# Patient Record
Sex: Female | Born: 1935 | Race: White | Hispanic: No | Marital: Married | Smoking: Former smoker
Health system: Southern US, Community
[De-identification: ages and names within clinical notes are randomized; demographics above are authoritative.]

## PROBLEM LIST (undated history)

## (undated) DIAGNOSIS — K219 Gastro-esophageal reflux disease without esophagitis: Secondary | ICD-10-CM

## (undated) DIAGNOSIS — Z8489 Family history of other specified conditions: Secondary | ICD-10-CM

## (undated) DIAGNOSIS — J189 Pneumonia, unspecified organism: Secondary | ICD-10-CM

## (undated) DIAGNOSIS — C8315 Mantle cell lymphoma, lymph nodes of inguinal region and lower limb: Secondary | ICD-10-CM

## (undated) DIAGNOSIS — IMO0001 Reserved for inherently not codable concepts without codable children: Secondary | ICD-10-CM

## (undated) DIAGNOSIS — K279 Peptic ulcer, site unspecified, unspecified as acute or chronic, without hemorrhage or perforation: Secondary | ICD-10-CM

## (undated) DIAGNOSIS — M1990 Unspecified osteoarthritis, unspecified site: Secondary | ICD-10-CM

## (undated) DIAGNOSIS — E785 Hyperlipidemia, unspecified: Secondary | ICD-10-CM

## (undated) DIAGNOSIS — F329 Major depressive disorder, single episode, unspecified: Secondary | ICD-10-CM

## (undated) DIAGNOSIS — J449 Chronic obstructive pulmonary disease, unspecified: Secondary | ICD-10-CM

## (undated) DIAGNOSIS — K5733 Diverticulitis of large intestine without perforation or abscess with bleeding: Secondary | ICD-10-CM

## (undated) DIAGNOSIS — R569 Unspecified convulsions: Secondary | ICD-10-CM

## (undated) DIAGNOSIS — Z8673 Personal history of transient ischemic attack (TIA), and cerebral infarction without residual deficits: Secondary | ICD-10-CM

## (undated) DIAGNOSIS — I2510 Atherosclerotic heart disease of native coronary artery without angina pectoris: Secondary | ICD-10-CM

## (undated) DIAGNOSIS — I10 Essential (primary) hypertension: Secondary | ICD-10-CM

## (undated) DIAGNOSIS — I639 Cerebral infarction, unspecified: Secondary | ICD-10-CM

## (undated) DIAGNOSIS — R0602 Shortness of breath: Secondary | ICD-10-CM

## (undated) DIAGNOSIS — I739 Peripheral vascular disease, unspecified: Secondary | ICD-10-CM

## (undated) DIAGNOSIS — F32A Depression, unspecified: Secondary | ICD-10-CM

## (undated) DIAGNOSIS — C329 Malignant neoplasm of larynx, unspecified: Secondary | ICD-10-CM

## (undated) DIAGNOSIS — I8310 Varicose veins of unspecified lower extremity with inflammation: Secondary | ICD-10-CM

## (undated) DIAGNOSIS — Z5189 Encounter for other specified aftercare: Secondary | ICD-10-CM

## (undated) DIAGNOSIS — K432 Incisional hernia without obstruction or gangrene: Secondary | ICD-10-CM

## (undated) DIAGNOSIS — R011 Cardiac murmur, unspecified: Secondary | ICD-10-CM

## (undated) DIAGNOSIS — I82409 Acute embolism and thrombosis of unspecified deep veins of unspecified lower extremity: Secondary | ICD-10-CM

## (undated) DIAGNOSIS — L858 Other specified epidermal thickening: Secondary | ICD-10-CM

## (undated) DIAGNOSIS — F419 Anxiety disorder, unspecified: Secondary | ICD-10-CM

## (undated) DIAGNOSIS — Z8719 Personal history of other diseases of the digestive system: Secondary | ICD-10-CM

## (undated) HISTORY — DX: Cerebral infarction, unspecified: I63.9

## (undated) HISTORY — DX: Unspecified osteoarthritis, unspecified site: M19.90

## (undated) HISTORY — DX: Personal history of transient ischemic attack (TIA), and cerebral infarction without residual deficits: Z86.73

## (undated) HISTORY — DX: Depression, unspecified: F32.A

## (undated) HISTORY — PX: TOTAL KNEE ARTHROPLASTY: SHX125

## (undated) HISTORY — DX: Hyperlipidemia, unspecified: E78.5

## (undated) HISTORY — DX: Incisional hernia without obstruction or gangrene: K43.2

## (undated) HISTORY — DX: Encounter for other specified aftercare: Z51.89

## (undated) HISTORY — DX: Gastro-esophageal reflux disease without esophagitis: K21.9

## (undated) HISTORY — DX: Peripheral vascular disease, unspecified: I73.9

## (undated) HISTORY — DX: Major depressive disorder, single episode, unspecified: F32.9

## (undated) HISTORY — PX: CARPAL TUNNEL RELEASE: SHX101

## (undated) HISTORY — DX: Other specified epidermal thickening: L85.8

## (undated) HISTORY — DX: Reserved for inherently not codable concepts without codable children: IMO0001

## (undated) HISTORY — DX: Varicose veins of unspecified lower extremity with inflammation: I83.10

## (undated) HISTORY — DX: Peptic ulcer, site unspecified, unspecified as acute or chronic, without hemorrhage or perforation: K27.9

## (undated) HISTORY — DX: Malignant neoplasm of larynx, unspecified: C32.9

## (undated) HISTORY — DX: Diverticulitis of large intestine without perforation or abscess with bleeding: K57.33

## (undated) HISTORY — DX: Chronic obstructive pulmonary disease, unspecified: J44.9

## (undated) HISTORY — DX: Anxiety disorder, unspecified: F41.9

## (undated) HISTORY — DX: Pneumonia, unspecified organism: J18.9

---
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
## 1970-08-03 HISTORY — PX: TUBAL LIGATION: SHX77

## 1984-08-03 HISTORY — PX: RADICAL NECK DISSECTION: SHX2284

## 2002-08-03 HISTORY — PX: CAROTID ENDARTERECTOMY: SUR193

## 2003-08-04 HISTORY — PX: CATARACT EXTRACTION: SUR2

## 2005-03-24 ENCOUNTER — Ambulatory Visit: Payer: Self-pay | Admitting: Internal Medicine

## 2005-03-25 ENCOUNTER — Ambulatory Visit: Payer: Self-pay | Admitting: Family Medicine

## 2005-04-28 ENCOUNTER — Ambulatory Visit: Payer: Self-pay | Admitting: Internal Medicine

## 2005-05-19 ENCOUNTER — Encounter: Admission: RE | Admit: 2005-05-19 | Discharge: 2005-05-19 | Payer: Self-pay | Admitting: Internal Medicine

## 2005-06-11 ENCOUNTER — Ambulatory Visit: Payer: Self-pay | Admitting: Internal Medicine

## 2005-06-15 ENCOUNTER — Ambulatory Visit: Payer: Self-pay | Admitting: Internal Medicine

## 2005-06-23 ENCOUNTER — Ambulatory Visit: Payer: Self-pay | Admitting: Internal Medicine

## 2005-06-30 ENCOUNTER — Ambulatory Visit: Payer: Self-pay | Admitting: Internal Medicine

## 2005-07-14 ENCOUNTER — Encounter: Admission: RE | Admit: 2005-07-14 | Discharge: 2005-07-14 | Payer: Self-pay | Admitting: Internal Medicine

## 2005-09-11 ENCOUNTER — Ambulatory Visit: Payer: Self-pay | Admitting: Internal Medicine

## 2005-12-03 ENCOUNTER — Ambulatory Visit: Payer: Self-pay | Admitting: Internal Medicine

## 2005-12-04 ENCOUNTER — Ambulatory Visit: Payer: Self-pay | Admitting: Cardiology

## 2005-12-14 ENCOUNTER — Ambulatory Visit: Payer: Self-pay | Admitting: Internal Medicine

## 2005-12-23 ENCOUNTER — Ambulatory Visit: Payer: Self-pay | Admitting: Internal Medicine

## 2006-01-05 ENCOUNTER — Ambulatory Visit: Payer: Self-pay | Admitting: Internal Medicine

## 2006-01-06 ENCOUNTER — Encounter: Admission: RE | Admit: 2006-01-06 | Discharge: 2006-01-06 | Payer: Self-pay | Admitting: Internal Medicine

## 2006-02-22 ENCOUNTER — Ambulatory Visit: Payer: Self-pay | Admitting: Internal Medicine

## 2006-05-06 ENCOUNTER — Ambulatory Visit: Payer: Self-pay | Admitting: Internal Medicine

## 2006-08-19 ENCOUNTER — Ambulatory Visit: Payer: Self-pay | Admitting: Internal Medicine

## 2006-11-26 ENCOUNTER — Ambulatory Visit: Payer: Self-pay | Admitting: Internal Medicine

## 2007-03-18 DIAGNOSIS — Z8679 Personal history of other diseases of the circulatory system: Secondary | ICD-10-CM | POA: Insufficient documentation

## 2007-03-18 DIAGNOSIS — I10 Essential (primary) hypertension: Secondary | ICD-10-CM | POA: Insufficient documentation

## 2007-03-18 DIAGNOSIS — E785 Hyperlipidemia, unspecified: Secondary | ICD-10-CM | POA: Insufficient documentation

## 2007-05-03 ENCOUNTER — Ambulatory Visit: Payer: Self-pay | Admitting: Internal Medicine

## 2007-05-03 DIAGNOSIS — M1710 Unilateral primary osteoarthritis, unspecified knee: Secondary | ICD-10-CM | POA: Insufficient documentation

## 2007-05-03 DIAGNOSIS — K219 Gastro-esophageal reflux disease without esophagitis: Secondary | ICD-10-CM

## 2007-05-03 DIAGNOSIS — M1990 Unspecified osteoarthritis, unspecified site: Secondary | ICD-10-CM | POA: Insufficient documentation

## 2007-05-03 HISTORY — DX: Unspecified osteoarthritis, unspecified site: M19.90

## 2007-05-03 HISTORY — DX: Gastro-esophageal reflux disease without esophagitis: K21.9

## 2007-05-03 LAB — CONVERTED CEMR LAB: HDL goal, serum: 40 mg/dL

## 2007-05-25 ENCOUNTER — Ambulatory Visit: Payer: Self-pay | Admitting: Internal Medicine

## 2007-06-15 ENCOUNTER — Inpatient Hospital Stay (HOSPITAL_COMMUNITY): Admission: RE | Admit: 2007-06-15 | Discharge: 2007-06-19 | Payer: Self-pay | Admitting: Orthopedic Surgery

## 2007-09-05 ENCOUNTER — Telehealth: Payer: Self-pay | Admitting: Internal Medicine

## 2007-09-08 ENCOUNTER — Ambulatory Visit: Payer: Self-pay | Admitting: Internal Medicine

## 2007-09-08 DIAGNOSIS — R61 Generalized hyperhidrosis: Secondary | ICD-10-CM | POA: Insufficient documentation

## 2007-09-08 LAB — CONVERTED CEMR LAB
Basophils Relative: 0.6 % (ref 0.0–1.0)
CRP, High Sensitivity: 1 (ref 0.00–5.00)
Eosinophils Relative: 2.1 % (ref 0.0–5.0)
HCT: 40 % (ref 36.0–46.0)
Hemoglobin: 12.8 g/dL (ref 12.0–15.0)
Lymphocytes Relative: 25.9 % (ref 12.0–46.0)
MCV: 83.7 fL (ref 78.0–100.0)
Neutro Abs: 4.3 10*3/uL (ref 1.4–7.7)
Neutrophils Relative %: 62.7 % (ref 43.0–77.0)
Platelets: 273 10*3/uL (ref 150–400)
WBC: 6.8 10*3/uL (ref 4.5–10.5)

## 2007-10-06 ENCOUNTER — Ambulatory Visit: Payer: Self-pay | Admitting: Internal Medicine

## 2007-10-06 DIAGNOSIS — L659 Nonscarring hair loss, unspecified: Secondary | ICD-10-CM | POA: Insufficient documentation

## 2008-01-17 ENCOUNTER — Ambulatory Visit: Payer: Self-pay | Admitting: Internal Medicine

## 2008-03-21 ENCOUNTER — Telehealth: Payer: Self-pay | Admitting: Internal Medicine

## 2008-03-26 ENCOUNTER — Ambulatory Visit: Payer: Self-pay | Admitting: Internal Medicine

## 2008-03-26 DIAGNOSIS — J449 Chronic obstructive pulmonary disease, unspecified: Secondary | ICD-10-CM | POA: Insufficient documentation

## 2008-03-28 ENCOUNTER — Ambulatory Visit: Payer: Self-pay | Admitting: Cardiovascular Disease

## 2008-04-03 ENCOUNTER — Ambulatory Visit: Payer: Self-pay | Admitting: Internal Medicine

## 2008-04-03 DIAGNOSIS — M19049 Primary osteoarthritis, unspecified hand: Secondary | ICD-10-CM | POA: Insufficient documentation

## 2008-04-13 ENCOUNTER — Ambulatory Visit: Payer: Self-pay

## 2008-04-25 ENCOUNTER — Telehealth (INDEPENDENT_AMBULATORY_CARE_PROVIDER_SITE_OTHER): Payer: Self-pay | Admitting: *Deleted

## 2008-06-04 ENCOUNTER — Ambulatory Visit: Payer: Self-pay | Admitting: Internal Medicine

## 2008-06-04 LAB — CONVERTED CEMR LAB
Albumin: 3.5 g/dL (ref 3.5–5.2)
BUN: 9 mg/dL (ref 6–23)
Bilirubin, Direct: 0.1 mg/dL (ref 0.0–0.3)
Calcium: 9.4 mg/dL (ref 8.4–10.5)
GFR calc Af Amer: 69 mL/min
GFR calc non Af Amer: 57 mL/min
Glucose, Bld: 98 mg/dL (ref 70–99)
HDL: 23.5 mg/dL — ABNORMAL LOW (ref >39.0)
LDL Cholesterol: 58 mg/dL (ref 0–99)
Sodium: 143 meq/L (ref 135–145)
Total Protein: 8 g/dL (ref 6.0–8.3)
VLDL: 24 mg/dL (ref 0–40)

## 2008-06-05 ENCOUNTER — Ambulatory Visit: Payer: Self-pay | Admitting: Internal Medicine

## 2008-06-05 LAB — CONVERTED CEMR LAB
Basophils Relative: 0.5 % (ref 0.0–3.0)
Hemoglobin: 12 g/dL (ref 12.0–15.0)
Lymphocytes Relative: 7.6 % — ABNORMAL LOW (ref 12.0–46.0)
Monocytes Relative: 2.6 % — ABNORMAL LOW (ref 3.0–12.0)
Neutro Abs: 10.4 10*3/uL — ABNORMAL HIGH (ref 1.4–7.7)
RBC: 4.24 M/uL (ref 3.87–5.11)

## 2008-06-06 ENCOUNTER — Encounter: Payer: Self-pay | Admitting: Internal Medicine

## 2008-06-09 ENCOUNTER — Ambulatory Visit: Payer: Self-pay | Admitting: Cardiovascular Disease

## 2008-06-09 ENCOUNTER — Ambulatory Visit: Payer: Self-pay | Admitting: Internal Medicine

## 2008-06-09 ENCOUNTER — Inpatient Hospital Stay (HOSPITAL_COMMUNITY): Admission: EM | Admit: 2008-06-09 | Discharge: 2008-06-12 | Payer: Self-pay | Admitting: Emergency Medicine

## 2008-06-11 ENCOUNTER — Ambulatory Visit: Payer: Self-pay | Admitting: Vascular Surgery

## 2008-06-11 ENCOUNTER — Encounter: Payer: Self-pay | Admitting: Internal Medicine

## 2008-06-11 ENCOUNTER — Encounter: Payer: Self-pay | Admitting: Urology

## 2008-06-15 ENCOUNTER — Ambulatory Visit: Payer: Self-pay | Admitting: Internal Medicine

## 2008-06-15 LAB — CONVERTED CEMR LAB
Alpha-2-Globulin: 14.2 % — ABNORMAL HIGH (ref 7.1–11.8)
Beta Globulin: 6.2 % (ref 4.7–7.2)
Gamma Globulin: 20 % — ABNORMAL HIGH (ref 11.1–18.8)

## 2008-06-18 ENCOUNTER — Ambulatory Visit: Payer: Self-pay | Admitting: Internal Medicine

## 2008-06-25 ENCOUNTER — Telehealth: Payer: Self-pay | Admitting: Internal Medicine

## 2008-07-02 ENCOUNTER — Telehealth: Payer: Self-pay | Admitting: Internal Medicine

## 2008-07-06 ENCOUNTER — Ambulatory Visit: Payer: Self-pay | Admitting: Internal Medicine

## 2008-07-06 DIAGNOSIS — K279 Peptic ulcer, site unspecified, unspecified as acute or chronic, without hemorrhage or perforation: Secondary | ICD-10-CM | POA: Insufficient documentation

## 2008-07-06 DIAGNOSIS — K5730 Diverticulosis of large intestine without perforation or abscess without bleeding: Secondary | ICD-10-CM | POA: Insufficient documentation

## 2008-07-06 DIAGNOSIS — R0600 Dyspnea, unspecified: Secondary | ICD-10-CM | POA: Insufficient documentation

## 2008-07-06 DIAGNOSIS — R0609 Other forms of dyspnea: Secondary | ICD-10-CM

## 2008-07-06 HISTORY — DX: Peptic ulcer, site unspecified, unspecified as acute or chronic, without hemorrhage or perforation: K27.9

## 2008-07-06 LAB — CONVERTED CEMR LAB
BUN: 11 mg/dL (ref 6–23)
Chloride: 105 meq/L (ref 96–112)
Eosinophils Absolute: 0.2 10*3/uL (ref 0.0–0.7)
Eosinophils Relative: 1.6 % (ref 0.0–5.0)
GFR calc non Af Amer: 74 mL/min
Monocytes Relative: 6 % (ref 3.0–12.0)
Neutrophils Relative %: 76.5 % (ref 43.0–77.0)
Platelets: 407 10*3/uL — ABNORMAL HIGH (ref 150–400)
Potassium: 4.7 meq/L (ref 3.5–5.1)
WBC: 9.7 10*3/uL (ref 4.5–10.5)

## 2008-07-10 ENCOUNTER — Encounter: Payer: Self-pay | Admitting: Internal Medicine

## 2008-07-10 ENCOUNTER — Ambulatory Visit: Payer: Self-pay | Admitting: Internal Medicine

## 2008-07-10 ENCOUNTER — Ambulatory Visit: Payer: Self-pay | Admitting: Cardiology

## 2008-07-10 ENCOUNTER — Inpatient Hospital Stay (HOSPITAL_COMMUNITY): Admission: AD | Admit: 2008-07-10 | Discharge: 2008-07-12 | Payer: Self-pay | Admitting: Internal Medicine

## 2008-07-10 DIAGNOSIS — K632 Fistula of intestine: Secondary | ICD-10-CM | POA: Insufficient documentation

## 2008-07-12 ENCOUNTER — Telehealth: Payer: Self-pay | Admitting: Internal Medicine

## 2008-07-24 ENCOUNTER — Ambulatory Visit: Payer: Self-pay | Admitting: Internal Medicine

## 2008-07-24 DIAGNOSIS — K5733 Diverticulitis of large intestine without perforation or abscess with bleeding: Secondary | ICD-10-CM

## 2008-07-24 HISTORY — DX: Diverticulitis of large intestine without perforation or abscess with bleeding: K57.33

## 2008-07-24 LAB — CONVERTED CEMR LAB
Basophils Relative: 0.7 % (ref 0.0–3.0)
Eosinophils Absolute: 0.1 10*3/uL (ref 0.0–0.7)
Eosinophils Relative: 2.5 % (ref 0.0–5.0)
Lymphocytes Relative: 28.2 % (ref 12.0–46.0)
MCV: 83.8 fL (ref 78.0–100.0)
Monocytes Relative: 11.1 % (ref 3.0–12.0)
Neutrophils Relative %: 57.5 % (ref 43.0–77.0)
RBC: 3.97 M/uL (ref 3.87–5.11)
WBC: 5.7 10*3/uL (ref 4.5–10.5)

## 2008-07-31 ENCOUNTER — Encounter: Payer: Self-pay | Admitting: Internal Medicine

## 2008-08-22 ENCOUNTER — Ambulatory Visit: Payer: Self-pay | Admitting: Internal Medicine

## 2008-09-13 ENCOUNTER — Encounter (INDEPENDENT_AMBULATORY_CARE_PROVIDER_SITE_OTHER): Payer: Self-pay | Admitting: Surgery

## 2008-09-13 ENCOUNTER — Inpatient Hospital Stay (HOSPITAL_COMMUNITY): Admission: RE | Admit: 2008-09-13 | Discharge: 2008-09-19 | Payer: Self-pay | Admitting: Surgery

## 2008-09-13 HISTORY — PX: COLON SURGERY: SHX602

## 2008-09-13 HISTORY — PX: APPENDECTOMY: SHX54

## 2008-09-21 ENCOUNTER — Inpatient Hospital Stay (HOSPITAL_COMMUNITY): Admission: EM | Admit: 2008-09-21 | Discharge: 2008-09-29 | Payer: Self-pay | Admitting: Emergency Medicine

## 2008-09-21 ENCOUNTER — Ambulatory Visit: Payer: Self-pay | Admitting: Pulmonary Disease

## 2008-09-22 HISTORY — PX: DIVERTING ILEOSTOMY: SHX5799

## 2008-10-08 ENCOUNTER — Telehealth: Payer: Self-pay | Admitting: *Deleted

## 2008-10-12 ENCOUNTER — Ambulatory Visit: Payer: Self-pay | Admitting: Internal Medicine

## 2008-10-12 ENCOUNTER — Inpatient Hospital Stay (HOSPITAL_COMMUNITY): Admission: EM | Admit: 2008-10-12 | Discharge: 2008-10-17 | Payer: Self-pay | Admitting: Internal Medicine

## 2008-10-19 ENCOUNTER — Telehealth: Payer: Self-pay | Admitting: Internal Medicine

## 2008-10-20 ENCOUNTER — Encounter: Payer: Self-pay | Admitting: Internal Medicine

## 2008-10-20 ENCOUNTER — Ambulatory Visit: Payer: Self-pay | Admitting: Internal Medicine

## 2008-10-20 ENCOUNTER — Inpatient Hospital Stay (HOSPITAL_COMMUNITY): Admission: EM | Admit: 2008-10-20 | Discharge: 2008-10-25 | Payer: Self-pay | Admitting: Emergency Medicine

## 2008-10-20 ENCOUNTER — Ambulatory Visit: Payer: Self-pay | Admitting: Cardiovascular Disease

## 2008-10-20 DIAGNOSIS — D649 Anemia, unspecified: Secondary | ICD-10-CM | POA: Insufficient documentation

## 2008-10-20 DIAGNOSIS — R1319 Other dysphagia: Secondary | ICD-10-CM | POA: Insufficient documentation

## 2008-10-20 DIAGNOSIS — F32A Depression, unspecified: Secondary | ICD-10-CM | POA: Insufficient documentation

## 2008-10-20 DIAGNOSIS — F329 Major depressive disorder, single episode, unspecified: Secondary | ICD-10-CM

## 2008-10-20 DIAGNOSIS — R5381 Other malaise: Secondary | ICD-10-CM | POA: Insufficient documentation

## 2008-10-20 DIAGNOSIS — I739 Peripheral vascular disease, unspecified: Secondary | ICD-10-CM | POA: Insufficient documentation

## 2008-10-20 DIAGNOSIS — F411 Generalized anxiety disorder: Secondary | ICD-10-CM | POA: Insufficient documentation

## 2008-10-20 DIAGNOSIS — R5383 Other fatigue: Secondary | ICD-10-CM

## 2008-10-23 ENCOUNTER — Encounter (INDEPENDENT_AMBULATORY_CARE_PROVIDER_SITE_OTHER): Payer: Self-pay | Admitting: Orthopedic Surgery

## 2008-10-25 ENCOUNTER — Telehealth: Payer: Self-pay | Admitting: Internal Medicine

## 2008-10-26 ENCOUNTER — Telehealth: Payer: Self-pay | Admitting: Internal Medicine

## 2008-10-26 ENCOUNTER — Ambulatory Visit: Payer: Self-pay | Admitting: Internal Medicine

## 2008-10-29 ENCOUNTER — Ambulatory Visit: Payer: Self-pay | Admitting: Internal Medicine

## 2008-10-29 LAB — CONVERTED CEMR LAB
INR: 2
Prothrombin Time: 17.3 s

## 2008-10-30 ENCOUNTER — Ambulatory Visit: Payer: Self-pay | Admitting: Internal Medicine

## 2008-11-08 ENCOUNTER — Telehealth: Payer: Self-pay | Admitting: Internal Medicine

## 2008-11-12 ENCOUNTER — Ambulatory Visit: Payer: Self-pay | Admitting: Internal Medicine

## 2008-11-12 LAB — CONVERTED CEMR LAB
INR: 2
Prothrombin Time: 17.6 s

## 2008-11-22 ENCOUNTER — Encounter: Admission: RE | Admit: 2008-11-22 | Discharge: 2008-11-22 | Payer: Self-pay | Admitting: Surgery

## 2008-11-26 ENCOUNTER — Ambulatory Visit: Payer: Self-pay | Admitting: Internal Medicine

## 2008-11-26 LAB — CONVERTED CEMR LAB: Prothrombin Time: 13.7 s

## 2008-11-27 ENCOUNTER — Ambulatory Visit: Payer: Self-pay | Admitting: Internal Medicine

## 2008-11-30 ENCOUNTER — Ambulatory Visit: Payer: Self-pay | Admitting: Internal Medicine

## 2008-12-11 ENCOUNTER — Telehealth: Payer: Self-pay | Admitting: Internal Medicine

## 2008-12-14 ENCOUNTER — Ambulatory Visit: Payer: Self-pay | Admitting: Internal Medicine

## 2008-12-14 DIAGNOSIS — I4891 Unspecified atrial fibrillation: Secondary | ICD-10-CM | POA: Insufficient documentation

## 2008-12-14 DIAGNOSIS — I480 Paroxysmal atrial fibrillation: Secondary | ICD-10-CM | POA: Insufficient documentation

## 2008-12-17 ENCOUNTER — Encounter (INDEPENDENT_AMBULATORY_CARE_PROVIDER_SITE_OTHER): Payer: Self-pay | Admitting: Surgery

## 2008-12-17 ENCOUNTER — Inpatient Hospital Stay (HOSPITAL_COMMUNITY): Admission: RE | Admit: 2008-12-17 | Discharge: 2008-12-22 | Payer: Self-pay | Admitting: Surgery

## 2008-12-24 ENCOUNTER — Ambulatory Visit: Payer: Self-pay | Admitting: Internal Medicine

## 2008-12-24 ENCOUNTER — Telehealth: Payer: Self-pay | Admitting: Internal Medicine

## 2008-12-27 ENCOUNTER — Ambulatory Visit: Payer: Self-pay | Admitting: Internal Medicine

## 2008-12-27 LAB — CONVERTED CEMR LAB: INR: 17.3

## 2009-01-07 ENCOUNTER — Ambulatory Visit: Payer: Self-pay | Admitting: Internal Medicine

## 2009-01-07 LAB — CONVERTED CEMR LAB
INR: 2.8
Prothrombin Time: 20.2 s

## 2009-01-15 ENCOUNTER — Telehealth: Payer: Self-pay | Admitting: Internal Medicine

## 2009-01-15 ENCOUNTER — Ambulatory Visit: Payer: Self-pay | Admitting: Internal Medicine

## 2009-01-15 DIAGNOSIS — I8310 Varicose veins of unspecified lower extremity with inflammation: Secondary | ICD-10-CM | POA: Insufficient documentation

## 2009-01-15 DIAGNOSIS — R609 Edema, unspecified: Secondary | ICD-10-CM | POA: Insufficient documentation

## 2009-01-15 HISTORY — DX: Varicose veins of unspecified lower extremity with inflammation: I83.10

## 2009-01-17 ENCOUNTER — Encounter: Payer: Self-pay | Admitting: Internal Medicine

## 2009-01-21 LAB — CONVERTED CEMR LAB
Basophils Absolute: 0 10*3/uL (ref 0.0–0.1)
CO2: 30 meq/L (ref 19–32)
Eosinophils Relative: 2.8 % (ref 0.0–5.0)
GFR calc non Af Amer: 56.87 mL/min (ref >60)
Glucose, Bld: 83 mg/dL (ref 70–99)
Monocytes Relative: 8.1 % (ref 3.0–12.0)
Neutrophils Relative %: 65.1 % (ref 43.0–77.0)
Platelets: 247 10*3/uL (ref 150.0–400.0)
Potassium: 4.3 meq/L (ref 3.5–5.1)
RDW: 14.7 % — ABNORMAL HIGH (ref 11.5–14.6)
Sodium: 141 meq/L (ref 135–145)
TSH: 0.65 microintl units/mL (ref 0.35–5.50)
WBC: 5.1 10*3/uL (ref 4.5–10.5)

## 2009-01-24 ENCOUNTER — Ambulatory Visit: Payer: Self-pay | Admitting: Internal Medicine

## 2009-01-24 LAB — CONVERTED CEMR LAB: INR: 2.4

## 2009-02-12 ENCOUNTER — Ambulatory Visit: Payer: Self-pay | Admitting: Internal Medicine

## 2009-02-22 ENCOUNTER — Ambulatory Visit: Payer: Self-pay | Admitting: Internal Medicine

## 2009-02-22 LAB — CONVERTED CEMR LAB
INR: 1.9
Prothrombin Time: 16.8 s

## 2009-03-18 ENCOUNTER — Ambulatory Visit: Payer: Self-pay | Admitting: Internal Medicine

## 2009-03-18 DIAGNOSIS — M542 Cervicalgia: Secondary | ICD-10-CM | POA: Insufficient documentation

## 2009-03-19 ENCOUNTER — Ambulatory Visit: Payer: Self-pay | Admitting: Internal Medicine

## 2009-03-20 ENCOUNTER — Encounter (INDEPENDENT_AMBULATORY_CARE_PROVIDER_SITE_OTHER): Payer: Self-pay | Admitting: *Deleted

## 2009-03-22 ENCOUNTER — Ambulatory Visit: Payer: Self-pay | Admitting: Internal Medicine

## 2009-04-19 ENCOUNTER — Ambulatory Visit: Payer: Self-pay | Admitting: Internal Medicine

## 2009-04-19 LAB — CONVERTED CEMR LAB

## 2009-05-10 ENCOUNTER — Ambulatory Visit: Payer: Self-pay | Admitting: Internal Medicine

## 2009-05-10 LAB — CONVERTED CEMR LAB: INR: 1.6

## 2009-05-31 ENCOUNTER — Ambulatory Visit: Payer: Self-pay | Admitting: Internal Medicine

## 2009-07-02 ENCOUNTER — Ambulatory Visit: Payer: Self-pay | Admitting: Internal Medicine

## 2009-07-02 LAB — CONVERTED CEMR LAB: INR: 2.6

## 2009-07-07 IMAGING — RF DG BE W/ CM - WO/W KUB
17 of 22 series · 17 of 22 positions shown · IV contrast (agent unspecified)
Comparison: CT abdomen and pelvis 10/12/2008

CLINICAL DATA: History of diverticulitis with resection and
rectosigmoid anastomoses.  The patient has an ileostomy.

SINGLE CONTRAST BARIUM ENEMA
Fluoroscopy Time: 1.9 minutes.
Contrast: [id number]-YFF

[Series 1: run · 1 of 1 slices shown (1 of 15)]
[im 1/1]
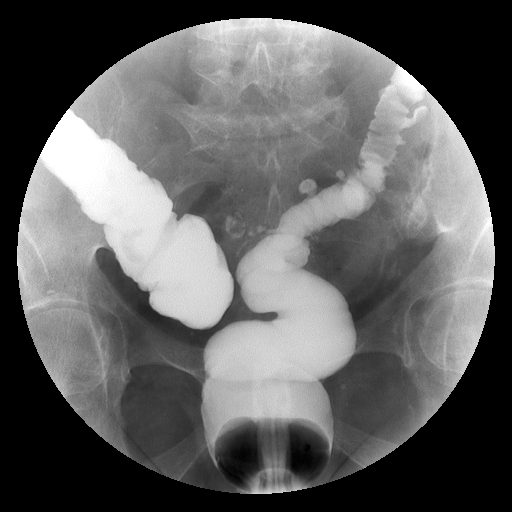

[Series 2: run · 1 of 1 slices shown (2 of 15)]
[im 1/1]
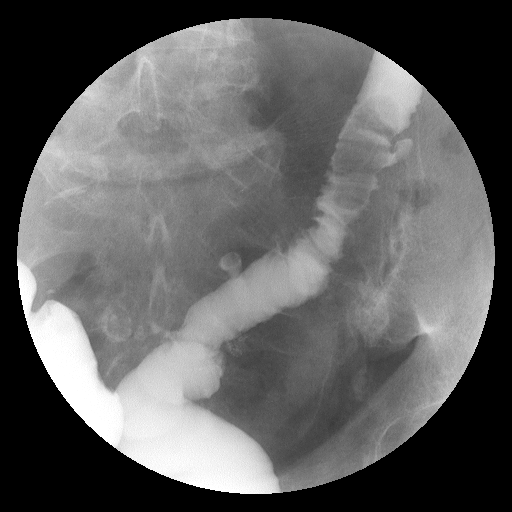

[Series 4: run · 1 of 1 slices shown (3 of 15)]
[im 1/1]
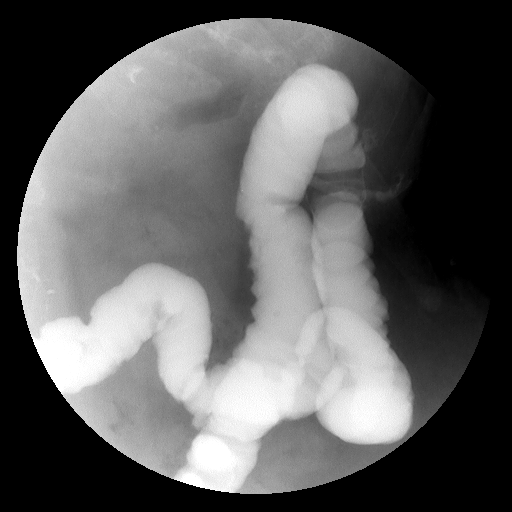

[Series 5: run · 1 of 1 slices shown (4 of 15)]
[im 1/1]
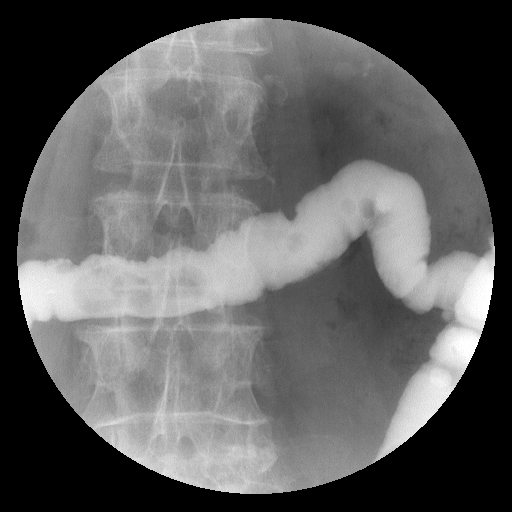

[Series 6: run · 1 of 1 slices shown (5 of 15)]
[im 1/1]
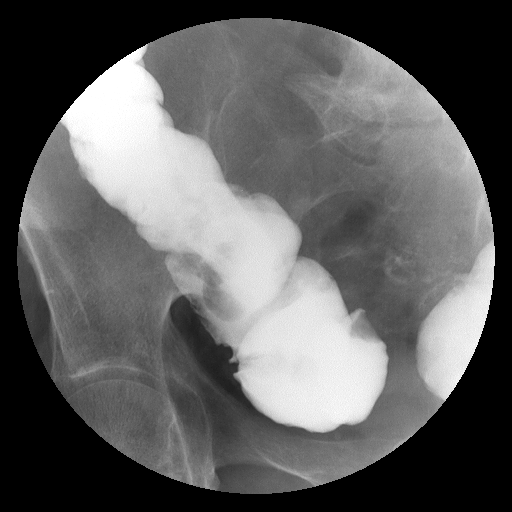

[Series 8: run · 1 of 1 slices shown (6 of 15)]
[im 1/1]
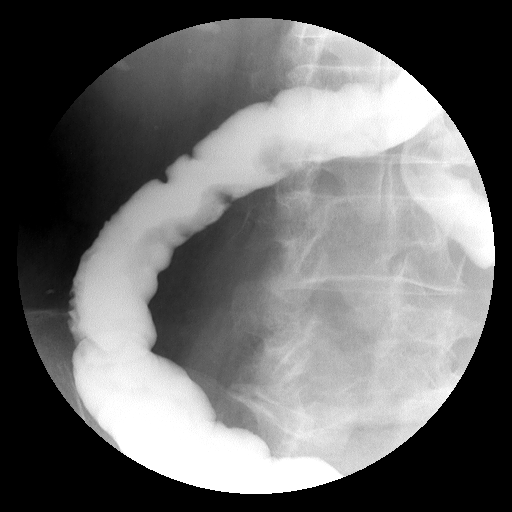

[Series 9: run · 1 of 1 slices shown (7 of 15)]
[im 1/1]
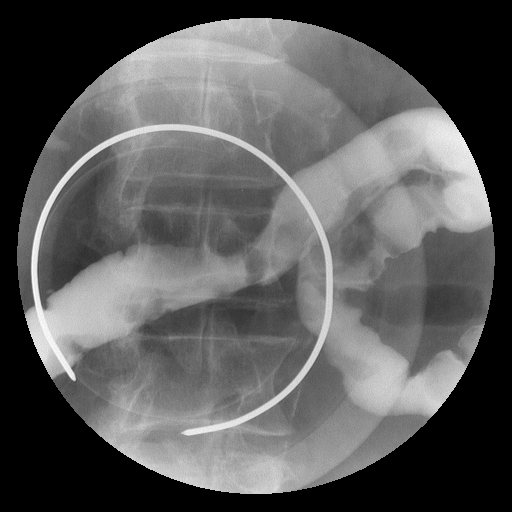

[Series 10: run · 1 of 1 slices shown (8 of 15)]
[im 1/1]
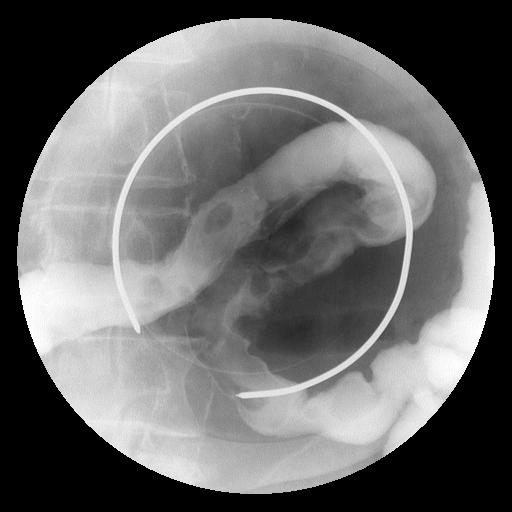

[Series 12: run · 1 of 1 slices shown (9 of 15)]
[im 1/1]
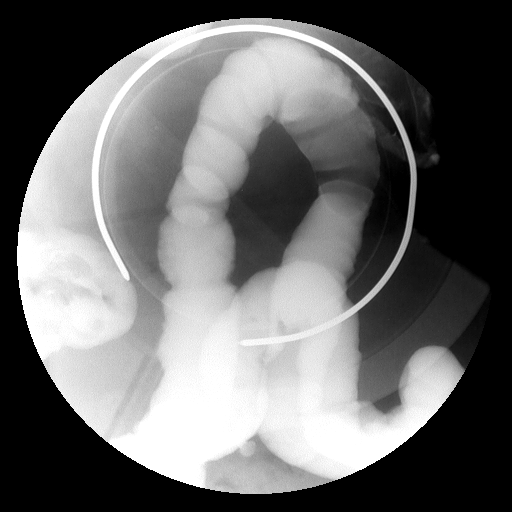

[Series 13: run · 1 of 1 slices shown (10 of 15)]
[im 1/1]
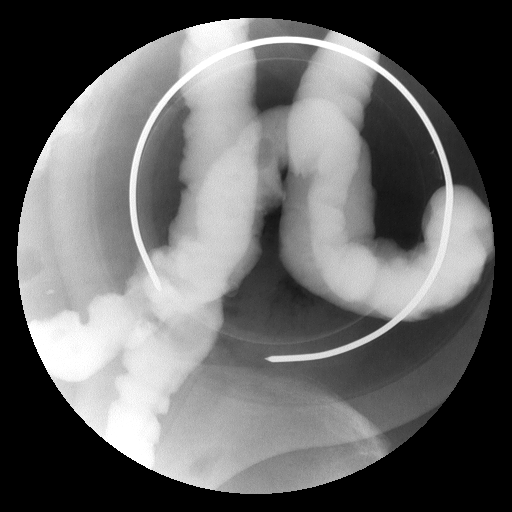

[Series 14: run · 1 of 1 slices shown (11 of 15)]
[im 1/1]
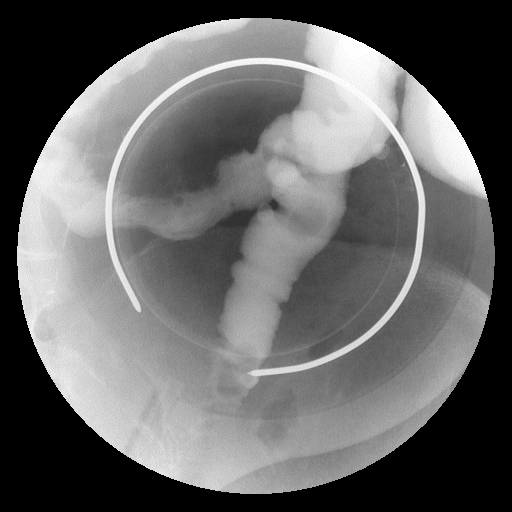

[Series 15: run · 1 of 1 slices shown (12 of 15)]
[im 1/1]
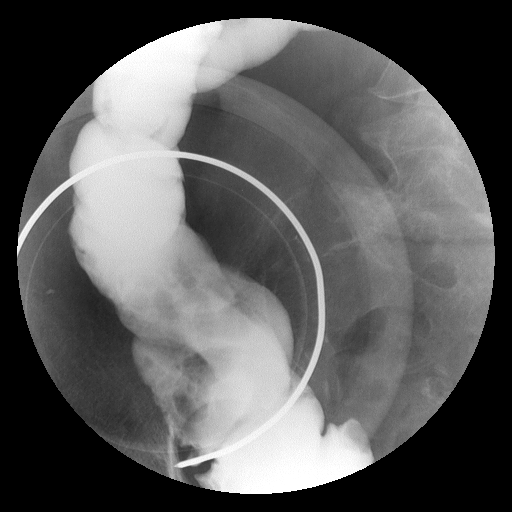

[Series 17: run · 1 of 1 slices shown (13 of 15)]
[im 1/1]
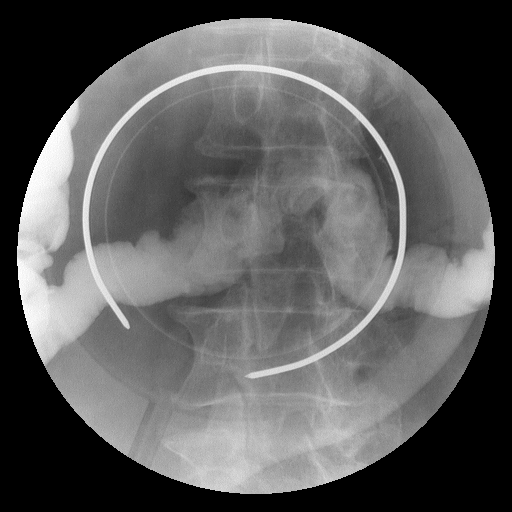

[Series 18: run · 1 of 1 slices shown (14 of 15)]
[im 1/1]
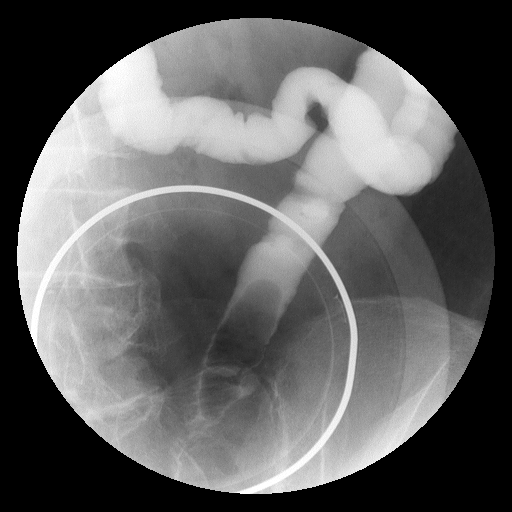

[Series 19: run · 1 of 1 slices shown (15 of 15)]
[im 1/1]
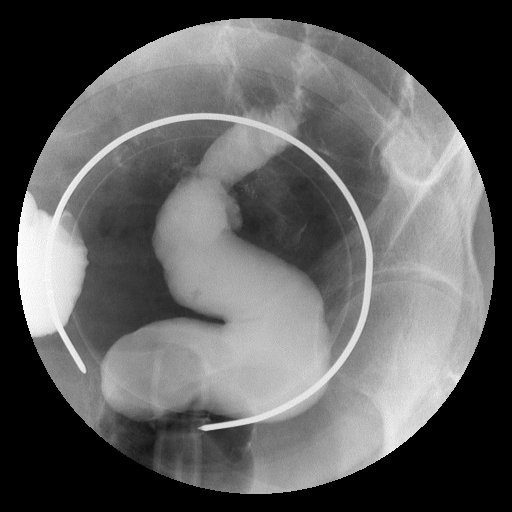

[Series 1002: view not recorded · 0.20mm/px · 1 of 1 slices shown (1 of 2)]
[im 1/1]
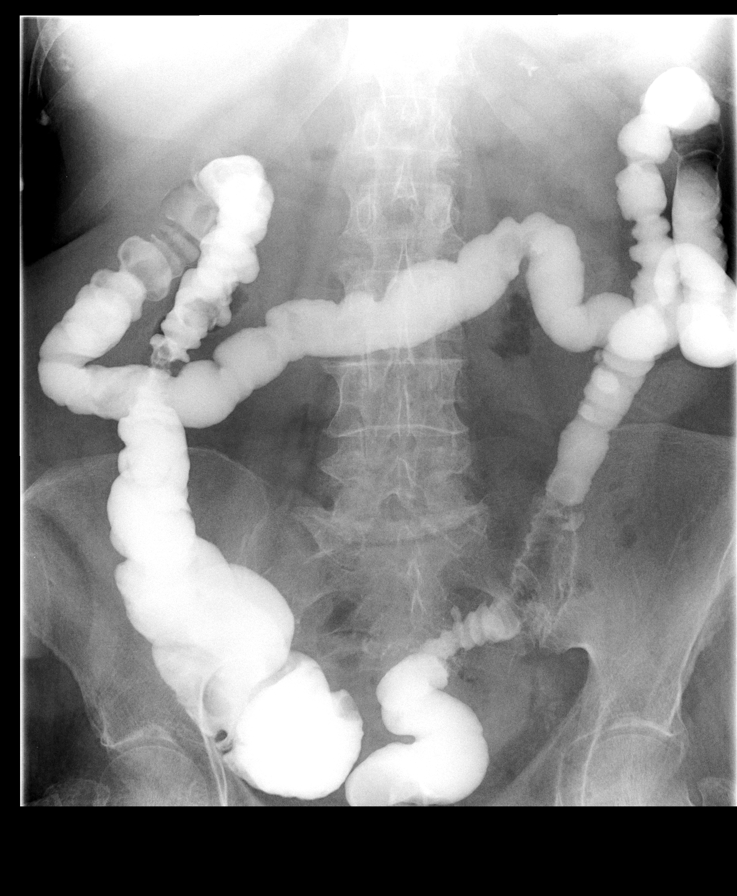

[Series 1003: view not recorded · 0.20mm/px · 1 of 1 slices shown (2 of 2)]
[im 1/1]
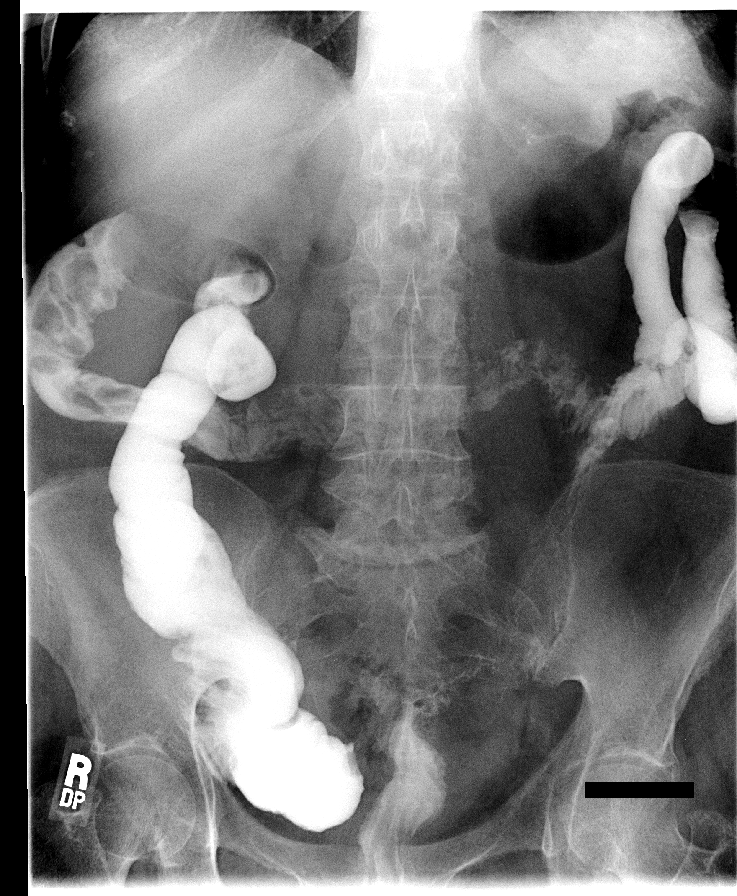

[17 of 22 positions shown; findings below may reference images not displayed]

FINDINGS: The rectosigmoid and anastomosis appears normal.  No
evidence for leaking contrast.  No inflammatory changes or mass.
There is scattered sigmoid diverticulosis.  There are filling
defects throughout the colon but these are mobile.
IMPRESSION: 1.  No complicating features are identified at the rectosigmoid
anastomosis.
2.  Minimal scattered sigmoid diverticulosis.
3.  The remainder of the colon is unremarkable.

## 2009-08-21 ENCOUNTER — Ambulatory Visit: Payer: Self-pay | Admitting: Internal Medicine

## 2009-08-21 LAB — CONVERTED CEMR LAB
BUN: 14 mg/dL (ref 6–23)
Basophils Relative: 2.6 % (ref 0.0–3.0)
CO2: 30 meq/L (ref 19–32)
Chloride: 106 meq/L (ref 96–112)
Eosinophils Relative: 1.7 % (ref 0.0–5.0)
Free T4: 0.9 ng/dL (ref 0.6–1.6)
Glucose, Bld: 103 mg/dL — ABNORMAL HIGH (ref 70–99)
Lymphocytes Relative: 26.8 % (ref 12.0–46.0)
MCV: 84 fL (ref 78.0–100.0)
Monocytes Absolute: 0.5 10*3/uL (ref 0.1–1.0)
Monocytes Relative: 12.2 % — ABNORMAL HIGH (ref 3.0–12.0)
Neutrophils Relative %: 56.7 % (ref 43.0–77.0)
Platelets: 224 10*3/uL (ref 150.0–400.0)
Potassium: 4.6 meq/L (ref 3.5–5.1)
RBC: 4.64 M/uL (ref 3.87–5.11)
Sodium: 141 meq/L (ref 135–145)
T3, Free: 2.8 pg/mL (ref 2.3–4.2)
WBC: 4.4 10*3/uL — ABNORMAL LOW (ref 4.5–10.5)

## 2009-08-23 ENCOUNTER — Telehealth: Payer: Self-pay | Admitting: Internal Medicine

## 2009-09-02 ENCOUNTER — Telehealth: Payer: Self-pay | Admitting: Internal Medicine

## 2009-09-11 ENCOUNTER — Ambulatory Visit: Payer: Self-pay | Admitting: Internal Medicine

## 2009-09-11 DIAGNOSIS — R197 Diarrhea, unspecified: Secondary | ICD-10-CM | POA: Insufficient documentation

## 2009-09-20 ENCOUNTER — Ambulatory Visit: Payer: Self-pay | Admitting: Internal Medicine

## 2009-09-20 LAB — CONVERTED CEMR LAB
INR: 2
Prothrombin Time: 17.5 s

## 2009-10-18 ENCOUNTER — Ambulatory Visit: Payer: Self-pay | Admitting: Internal Medicine

## 2009-10-18 LAB — CONVERTED CEMR LAB
INR: 1.5
Prothrombin Time: 14.8 s

## 2009-11-08 ENCOUNTER — Ambulatory Visit: Payer: Self-pay | Admitting: Internal Medicine

## 2009-11-13 ENCOUNTER — Ambulatory Visit: Payer: Self-pay | Admitting: Internal Medicine

## 2009-11-13 LAB — CONVERTED CEMR LAB
Eosinophils Absolute: 0.1 10*3/uL (ref 0.0–0.7)
Eosinophils Relative: 2.3 % (ref 0.0–5.0)
HCT: 39.6 % (ref 36.0–46.0)
Lymphs Abs: 1.5 10*3/uL (ref 0.7–4.0)
MCHC: 33.2 g/dL (ref 30.0–36.0)
MCV: 83.8 fL (ref 78.0–100.0)
Monocytes Absolute: 0.4 10*3/uL (ref 0.1–1.0)
Neutrophils Relative %: 60.5 % (ref 43.0–77.0)
Platelets: 256 10*3/uL (ref 150.0–400.0)
WBC: 5.2 10*3/uL (ref 4.5–10.5)

## 2009-11-20 ENCOUNTER — Ambulatory Visit: Payer: Self-pay | Admitting: Internal Medicine

## 2009-11-20 LAB — CONVERTED CEMR LAB
Cholesterol, target level: 200 mg/dL
LDL Goal: 100 mg/dL

## 2009-11-21 ENCOUNTER — Ambulatory Visit: Payer: Self-pay | Admitting: Internal Medicine

## 2009-11-21 LAB — CONVERTED CEMR LAB: INR: 2.1

## 2009-11-27 ENCOUNTER — Ambulatory Visit: Payer: Self-pay | Admitting: Internal Medicine

## 2009-12-02 ENCOUNTER — Telehealth: Payer: Self-pay | Admitting: Internal Medicine

## 2009-12-02 LAB — CONVERTED CEMR LAB: Vit D, 25-Hydroxy: 23 ng/mL — ABNORMAL LOW (ref 30–89)

## 2009-12-22 ENCOUNTER — Observation Stay (HOSPITAL_COMMUNITY): Admission: EM | Admit: 2009-12-22 | Discharge: 2009-12-23 | Payer: Self-pay | Admitting: Emergency Medicine

## 2009-12-23 ENCOUNTER — Telehealth: Payer: Self-pay | Admitting: Internal Medicine

## 2009-12-23 DIAGNOSIS — R079 Chest pain, unspecified: Secondary | ICD-10-CM | POA: Insufficient documentation

## 2009-12-31 ENCOUNTER — Telehealth (INDEPENDENT_AMBULATORY_CARE_PROVIDER_SITE_OTHER): Payer: Self-pay | Admitting: *Deleted

## 2010-01-01 ENCOUNTER — Encounter (HOSPITAL_COMMUNITY): Admission: RE | Admit: 2010-01-01 | Discharge: 2010-03-05 | Payer: Self-pay | Admitting: Internal Medicine

## 2010-01-01 ENCOUNTER — Ambulatory Visit: Payer: Self-pay

## 2010-01-01 ENCOUNTER — Ambulatory Visit: Payer: Self-pay | Admitting: Cardiology

## 2010-01-01 HISTORY — PX: ILEOSTOMY CLOSURE: SHX1784

## 2010-01-06 ENCOUNTER — Telehealth: Payer: Self-pay | Admitting: Internal Medicine

## 2010-01-06 ENCOUNTER — Ambulatory Visit: Payer: Self-pay | Admitting: Internal Medicine

## 2010-01-06 LAB — CONVERTED CEMR LAB
Basophils Relative: 0.4 % (ref 0.0–3.0)
HCT: 37 % (ref 36.0–46.0)
Hemoglobin: 12.5 g/dL (ref 12.0–15.0)
Lymphocytes Relative: 28.2 % (ref 12.0–46.0)
Lymphs Abs: 1.8 10*3/uL (ref 0.7–4.0)
MCHC: 33.8 g/dL (ref 30.0–36.0)
Monocytes Relative: 10.4 % (ref 3.0–12.0)
Neutro Abs: 3.7 10*3/uL (ref 1.4–7.7)
RBC: 4.3 M/uL (ref 3.87–5.11)

## 2010-01-08 ENCOUNTER — Ambulatory Visit: Payer: Self-pay | Admitting: Internal Medicine

## 2010-02-13 ENCOUNTER — Ambulatory Visit: Payer: Self-pay | Admitting: Internal Medicine

## 2010-02-13 LAB — CONVERTED CEMR LAB
Alkaline Phosphatase: 56 units/L (ref 39–117)
Basophils Relative: 0.6 % (ref 0.0–3.0)
Bilirubin Urine: NEGATIVE
Bilirubin, Direct: 0.1 mg/dL (ref 0.0–0.3)
CO2: 28 meq/L (ref 19–32)
Calcium: 9.2 mg/dL (ref 8.4–10.5)
Cholesterol: 176 mg/dL (ref 0–200)
Creatinine, Ser: 1.1 mg/dL (ref 0.4–1.2)
Eosinophils Absolute: 0.1 10*3/uL (ref 0.0–0.7)
GFR calc non Af Amer: 53.02 mL/min (ref >60)
HDL: 31.9 mg/dL — ABNORMAL LOW (ref >39.00)
Ketones, urine, test strip: NEGATIVE
Lymphocytes Relative: 29.5 % (ref 12.0–46.0)
MCHC: 33.7 g/dL (ref 30.0–36.0)
Monocytes Relative: 8.9 % (ref 3.0–12.0)
Neutrophils Relative %: 59 % (ref 43.0–77.0)
Nitrite: NEGATIVE
Protein, U semiquant: NEGATIVE
RBC: 4.38 M/uL (ref 3.87–5.11)
Total CHOL/HDL Ratio: 6
Total Protein: 7.4 g/dL (ref 6.0–8.3)
Triglycerides: 282 mg/dL — ABNORMAL HIGH (ref 0.0–149.0)
Urobilinogen, UA: 0.2
VLDL: 56.4 mg/dL — ABNORMAL HIGH (ref 0.0–40.0)
Vit D, 25-Hydroxy: 24 ng/mL — ABNORMAL LOW (ref 30–89)
WBC: 4.9 10*3/uL (ref 4.5–10.5)

## 2010-02-21 ENCOUNTER — Ambulatory Visit: Payer: Self-pay | Admitting: Internal Medicine

## 2010-02-21 DIAGNOSIS — Z96659 Presence of unspecified artificial knee joint: Secondary | ICD-10-CM | POA: Insufficient documentation

## 2010-03-05 ENCOUNTER — Ambulatory Visit (HOSPITAL_COMMUNITY): Admission: RE | Admit: 2010-03-05 | Discharge: 2010-03-05 | Payer: Self-pay | Admitting: Internal Medicine

## 2010-03-17 ENCOUNTER — Ambulatory Visit: Payer: Self-pay | Admitting: Internal Medicine

## 2010-03-17 LAB — CONVERTED CEMR LAB: INR: 2.7

## 2010-04-01 ENCOUNTER — Encounter: Payer: Self-pay | Admitting: Internal Medicine

## 2010-04-01 ENCOUNTER — Encounter: Admission: RE | Admit: 2010-04-01 | Discharge: 2010-04-08 | Payer: Self-pay | Admitting: Internal Medicine

## 2010-04-01 ENCOUNTER — Telehealth: Payer: Self-pay | Admitting: Internal Medicine

## 2010-04-03 ENCOUNTER — Ambulatory Visit: Payer: Self-pay | Admitting: Internal Medicine

## 2010-04-03 DIAGNOSIS — K432 Incisional hernia without obstruction or gangrene: Secondary | ICD-10-CM | POA: Insufficient documentation

## 2010-04-03 HISTORY — DX: Incisional hernia without obstruction or gangrene: K43.2

## 2010-04-08 ENCOUNTER — Encounter: Payer: Self-pay | Admitting: Internal Medicine

## 2010-04-18 ENCOUNTER — Encounter: Payer: Self-pay | Admitting: Internal Medicine

## 2010-04-22 ENCOUNTER — Encounter: Admission: RE | Admit: 2010-04-22 | Discharge: 2010-04-22 | Payer: Self-pay | Admitting: Surgery

## 2010-04-25 ENCOUNTER — Telehealth: Payer: Self-pay | Admitting: Internal Medicine

## 2010-04-25 ENCOUNTER — Ambulatory Visit: Payer: Self-pay | Admitting: Internal Medicine

## 2010-05-03 HISTORY — PX: HERNIA REPAIR: SHX51

## 2010-05-08 ENCOUNTER — Encounter: Payer: Self-pay | Admitting: Internal Medicine

## 2010-05-12 ENCOUNTER — Encounter: Payer: Self-pay | Admitting: Internal Medicine

## 2010-05-23 ENCOUNTER — Telehealth: Payer: Self-pay | Admitting: Internal Medicine

## 2010-05-28 ENCOUNTER — Inpatient Hospital Stay (HOSPITAL_COMMUNITY)
Admission: RE | Admit: 2010-05-28 | Discharge: 2010-05-30 | Payer: Self-pay | Source: Home / Self Care | Admitting: Surgery

## 2010-06-12 ENCOUNTER — Telehealth (INDEPENDENT_AMBULATORY_CARE_PROVIDER_SITE_OTHER): Payer: Self-pay | Admitting: *Deleted

## 2010-06-12 ENCOUNTER — Encounter: Payer: Self-pay | Admitting: Internal Medicine

## 2010-07-02 ENCOUNTER — Ambulatory Visit: Payer: Self-pay | Admitting: Internal Medicine

## 2010-07-02 LAB — CONVERTED CEMR LAB
ALT: 21 units/L (ref 0–35)
Alkaline Phosphatase: 69 units/L (ref 39–117)
Basophils Relative: 0.3 % (ref 0.0–3.0)
Bilirubin, Direct: 0.1 mg/dL (ref 0.0–0.3)
Eosinophils Relative: 1.7 % (ref 0.0–5.0)
Lymphocytes Relative: 25.9 % (ref 12.0–46.0)
MCV: 83.6 fL (ref 78.0–100.0)
Neutrophils Relative %: 64.5 % (ref 43.0–77.0)
RBC: 4.51 M/uL (ref 3.87–5.11)
Total Protein: 7.1 g/dL (ref 6.0–8.3)
WBC: 5.6 10*3/uL (ref 4.5–10.5)

## 2010-07-15 ENCOUNTER — Encounter: Payer: Self-pay | Admitting: Internal Medicine

## 2010-08-07 ENCOUNTER — Ambulatory Visit
Admission: RE | Admit: 2010-08-07 | Discharge: 2010-08-07 | Payer: Self-pay | Source: Home / Self Care | Attending: Internal Medicine | Admitting: Internal Medicine

## 2010-08-11 ENCOUNTER — Ambulatory Visit
Admission: RE | Admit: 2010-08-11 | Discharge: 2010-08-11 | Payer: Self-pay | Source: Home / Self Care | Attending: Internal Medicine | Admitting: Internal Medicine

## 2010-08-11 LAB — CONVERTED CEMR LAB: INR: 3.5

## 2010-08-12 ENCOUNTER — Telehealth: Payer: Self-pay | Admitting: Internal Medicine

## 2010-08-13 ENCOUNTER — Encounter: Payer: Self-pay | Admitting: Internal Medicine

## 2010-08-13 ENCOUNTER — Encounter
Admission: RE | Admit: 2010-08-13 | Discharge: 2010-09-02 | Payer: Self-pay | Source: Home / Self Care | Attending: Internal Medicine | Admitting: Internal Medicine

## 2010-08-15 ENCOUNTER — Ambulatory Visit
Admission: RE | Admit: 2010-08-15 | Discharge: 2010-08-15 | Payer: Self-pay | Source: Home / Self Care | Attending: Internal Medicine | Admitting: Internal Medicine

## 2010-08-23 ENCOUNTER — Encounter: Payer: Self-pay | Admitting: Internal Medicine

## 2010-08-25 ENCOUNTER — Ambulatory Visit
Admission: RE | Admit: 2010-08-25 | Discharge: 2010-08-25 | Payer: Self-pay | Source: Home / Self Care | Attending: Internal Medicine | Admitting: Internal Medicine

## 2010-09-02 NOTE — Assessment & Plan Note (Signed)
Summary: 2 month rov/njr   Vital Signs:  Patient profile:   77 year old female Height:      64 inches Weight:      194 pounds BMI:     33.42 Temp:     98.2 degrees F oral Pulse rate:   72 / minute Resp:     14 per minute BP sitting:   122 / 74  (left arm)  Vitals Entered By: Willy Eddy, LPN (November 20, 2009 10:09 AM) CC: roa labs, Hypertension Management, Lipid Management   CC:  roa labs, Hypertension Management, and Lipid Management.  History of Present Illness: folow p for blood loss anemia with resolution and stable values monitering lipids status and HTN mood is stable  Hypertension History:      She denies headache, chest pain, palpitations, dyspnea with exertion, orthopnea, PND, peripheral edema, visual symptoms, neurologic problems, syncope, and side effects from treatment.  She notes no problems with any antihypertensive medication side effects.  stable at goal.        Positive major cardiovascular risk factors include female age 27 years old or older, hyperlipidemia, and hypertension.  Negative major cardiovascular risk factors include no history of diabetes, negative family history for ischemic heart disease, and non-tobacco-user status.        Positive history for target organ damage include peripheral vascular disease.  Further assessment for target organ damage reveals no history of ASHD or stroke/TIA.    Lipid Management History:      Positive NCEP/ATP III risk factors include female age 51 years old or older, HDL cholesterol less than 40, hypertension, and peripheral vascular disease.  Negative NCEP/ATP III risk factors include no history of early menopause without estrogen hormone replacement, non-diabetic, no family history for ischemic heart disease, non-tobacco-user status, no ASHD (atherosclerotic heart disease), no prior stroke/TIA, and no history of aortic aneurysm.      Preventive Screening-Counseling & Management  Alcohol-Tobacco     Smoking Status:  quit     Packs/Day: 2.0     Year Started: 1944     Year Quit: 1986     Passive Smoke Exposure: yes  Problems Prior to Update: 1)  Encounter For Therapeutic Drug Monitoring  (ICD-V58.83) 2)  Diarrhea  (ICD-787.91) 3)  Neck and Back Pain  (ICD-723.1) 4)  Varicose Veins Lower Extremities W/inflammation  (ICD-454.1) 5)  Edema  (ICD-782.3) 6)  Other Acute Reactions To Stress  (ICD-308.3) 7)  Coumadin Therapy  (ICD-V58.61) 8)  Other Retroperitoneal Abscess  (ICD-567.38) 9)  Other Dysphagia  (ICD-787.29) 10)  Weakness  (ICD-780.79) 11)  Anemia-nos  (ICD-285.9) 12)  Depression  (ICD-311) 13)  Anxiety  (ICD-300.00) 14)  Peripheral Vascular Disease  (ICD-443.9) 15)  Atrial Fibrillation  (ICD-427.31) 16)  Hypoxemia  (ICD-799.02) 17)  Diverticulitis of Colon With Hemorrhage  (ICD-562.13) 18)  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  (ICD-569.81) 19)  Dyspnea  (ICD-786.05) 20)  Weight Loss, Abnormal  (ICD-783.21) 21)  Peptic Ulcer Disease  (ICD-533.90) 22)  Diverticulosis, Colon  (ICD-562.10) 23)  Constipation, Chronic  (ICD-564.09) 24)  Loc Osteoarthros Not Spec Whether Prim/sec Hand  (ICD-715.34) 25)  COPD  (ICD-496) 26)  Hair Loss  (ICD-704.00) 27)  Night Sweats  (ICD-780.8) 28)  Osteoarthrosis, Local Nos, Lower Leg  (ICD-715.36) 29)  Osteoarthritis  (ICD-715.90) 30)  Gerd  (ICD-530.81) 31)  Transient Ischemic Attack, Hx of  (ICD-V12.50) 32)  Cerebrovascular Accident, Hx of  (ICD-V12.50) 33)  Hypertension  (ICD-401.9) 34)  Hyperlipidemia  (  ICD-272.4)  Current Problems (verified): 1)  Encounter For Therapeutic Drug Monitoring  (ICD-V58.83) 2)  Diarrhea  (ICD-787.91) 3)  Neck and Back Pain  (ICD-723.1) 4)  Varicose Veins Lower Extremities W/inflammation  (ICD-454.1) 5)  Edema  (ICD-782.3) 6)  Other Acute Reactions To Stress  (ICD-308.3) 7)  Coumadin Therapy  (ICD-V58.61) 8)  Other Retroperitoneal Abscess  (ICD-567.38) 9)  Other Dysphagia  (ICD-787.29) 10)  Weakness   (ICD-780.79) 11)  Anemia-nos  (ICD-285.9) 12)  Depression  (ICD-311) 13)  Anxiety  (ICD-300.00) 14)  Peripheral Vascular Disease  (ICD-443.9) 15)  Atrial Fibrillation  (ICD-427.31) 16)  Hypoxemia  (ICD-799.02) 17)  Diverticulitis of Colon With Hemorrhage  (ICD-562.13) 18)  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  (ICD-569.81) 19)  Dyspnea  (ICD-786.05) 20)  Weight Loss, Abnormal  (ICD-783.21) 21)  Peptic Ulcer Disease  (ICD-533.90) 22)  Diverticulosis, Colon  (ICD-562.10) 23)  Constipation, Chronic  (ICD-564.09) 24)  Loc Osteoarthros Not Spec Whether Prim/sec Hand  (ICD-715.34) 25)  COPD  (ICD-496) 26)  Hair Loss  (ICD-704.00) 27)  Night Sweats  (ICD-780.8) 28)  Osteoarthrosis, Local Nos, Lower Leg  (ICD-715.36) 29)  Osteoarthritis  (ICD-715.90) 30)  Gerd  (ICD-530.81) 31)  Transient Ischemic Attack, Hx of  (ICD-V12.50) 32)  Cerebrovascular Accident, Hx of  (ICD-V12.50) 33)  Hypertension  (ICD-401.9) 34)  Hyperlipidemia  (ICD-272.4)  Medications Prior to Update: 1)  Vitamin D 30160 Unit  Caps (Ergocalciferol) .... One By Mouth Weekly With Food Hold 2)  Spiriva Handihaler 18 Mcg Caps (Tiotropium Bromide Monohydrate) .Marland Kitchen.. 1 Puff Once Daily 3)  Prilosec 40 Mg Cpdr (Omeprazole) .Marland Kitchen.. 1 Once Daily As Needed 4)  Cartia Xt 180 Mg Xr24h-Cap (Diltiazem Hcl Coated Beads) .... One By Mouth  Daily 5)  Warfarin Sodium 5 Mg Tabs (Warfarin Sodium) .... 1.5 Once Daily 6)  Alprazolam 0.5 Mg Tabs (Alprazolam) .... One By Mouth Tid 7)  Bisoprolol Fumarate 5 Mg Tabs (Bisoprolol Fumarate) .... One By Mouth Daily 8)  Clobetasol Propionate 0.05 % Foam (Clobetasol Propionate) .... Apply To Scalp Every Other Day 9)  Benefiber Drink Mix  Powd (Wheat Dextrin) .... Use It Daily  Current Medications (verified): 1)  Vitamin D 10932 Unit  Caps (Ergocalciferol) .... One By Mouth Weekly With Food Hold 2)  Spiriva Handihaler 18 Mcg Caps (Tiotropium Bromide Monohydrate) .Marland Kitchen.. 1 Puff Once Daily 3)  Prilosec  40 Mg Cpdr (Omeprazole) .Marland Kitchen.. 1 Once Daily As Needed 4)  Cartia Xt 180 Mg Xr24h-Cap (Diltiazem Hcl Coated Beads) .... One By Mouth  Daily 5)  Warfarin Sodium 5 Mg Tabs (Warfarin Sodium) .... 1.5 Once Daily 6)  Alprazolam 0.5 Mg Tabs (Alprazolam) .... One By Mouth Tid 7)  Bisoprolol Fumarate 5 Mg Tabs (Bisoprolol Fumarate) .... One By Mouth Daily 8)  Clobetasol Propionate 0.05 % Foam (Clobetasol Propionate) .... Apply To Scalp Every Other Day 9)  Benefiber Drink Mix  Powd (Wheat Dextrin) .... Use It Daily  Allergies: 1)  ! Darvon  Past History:  Family History: Last updated: 03/18/2007 Family History Hypertension Fam hx Stroke  Social History: Last updated: 03/18/2007 Former Smoker Alcohol use-no Drug use-no Retired Married Regular exercise-no  Risk Factors: Exercise: no (03/18/2007)  Risk Factors: Smoking Status: quit (11/20/2009) Packs/Day: 2.0 (11/20/2009) Passive Smoke Exposure: yes (11/20/2009)  Past medical, surgical, family and social histories (including risk factors) reviewed for relevance to current acute and chronic problems.  Past Medical History: Reviewed history from 10/20/2008 and no changes required. 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 Family History Hypertension Fam hx Stroke  Social History: Reviewed history from 03/18/2007 and no changes  required. Former Smoker Alcohol use-no Drug use-no Retired Married Regular exercise-no  Review of Systems  The patient denies anorexia, fever, weight loss, weight gain, vision loss, decreased hearing, hoarseness, chest pain, syncope, dyspnea on exertion, peripheral edema, prolonged cough, headaches, hemoptysis, abdominal pain, melena, hematochezia, severe indigestion/heartburn, hematuria, incontinence, genital sores, muscle weakness, suspicious skin lesions, transient blindness, difficulty walking, depression, unusual weight change, abnormal bleeding, enlarged lymph nodes, angioedema, and breast masses.    Physical Exam  General:  alert and underweight appearing.,pale.   Head:  normocephalic and atraumatic.  scalp with scaling lesions Eyes:  vision grossly intact, pupils equal, and pupils round.   Ears:  R ear normal and L ear normal.   Nose:  no external deformity and no nasal discharge.   Neck:  supple and no masses.   Lungs:  R decreased breath sounds and L decreased breath sounds.  ,. no wheeze, rales noted Heart:  normal rate, regular rhythm, and Grade   1/6 systolic ejection murmur.   Abdomen:  distended and epigastric tenderness.     Impression & Recommendations:  Problem # 1:  ANEMIA-NOS (ICD-285.9)  Hgb: 13.2 (11/13/2009)   Hct: 39.6 (11/13/2009)   Platelets: 256.0 (11/13/2009) RBC: 4.73 (11/13/2009)   RDW: 17.7 (11/13/2009)   WBC: 5.2 (11/13/2009) MCV: 83.8 (11/13/2009)   MCHC: 33.2 (11/13/2009) TSH: 2.08 (08/21/2009)  Problem # 2:  ATRIAL FIBRILLATION (ICD-427.31) on coumandin and has this checked at the coumadin clinic Her updated medication list for this problem includes:    Cartia Xt 180 Mg Xr24h-cap (Diltiazem hcl coated beads) ..... One by mouth  daily    Warfarin Sodium 5 Mg Tabs (Warfarin sodium) .Marland Kitchen... 1.5 once daily    Bisoprolol Fumarate 5 Mg Tabs (Bisoprolol fumarate) ..... One by mouth daily  Complete Medication List: 1)  Vitamin D 16109 Unit Caps  (Ergocalciferol) .... One by mouth weekly with food hold 2)  Spiriva Handihaler 18 Mcg Caps (Tiotropium bromide monohydrate) .Marland Kitchen.. 1 puff once daily 3)  Prilosec 40 Mg Cpdr (Omeprazole) .Marland Kitchen.. 1 once daily as needed 4)  Cartia Xt 180 Mg Xr24h-cap (Diltiazem hcl coated beads) .... One by mouth  daily 5)  Warfarin Sodium 5 Mg Tabs (Warfarin sodium) .... 1.5 once daily 6)  Alprazolam 0.5 Mg Tabs (Alprazolam) .... One by mouth tid 7)  Bisoprolol Fumarate 5 Mg Tabs (Bisoprolol fumarate) .... One by mouth daily 8)  Clobetasol Propionate 0.05 % Foam (Clobetasol propionate) .... Apply to scalp every other day 9)  Benefiber Drink Mix Powd (Wheat dextrin) .... Use it daily  Hypertension Assessment/Plan:      The patient's hypertensive risk group is category C: Target organ damage and/or diabetes.  Her calculated 10 year risk of coronary heart disease is 15 %.  Today's blood pressure is 122/74.  Her blood pressure goal is < 140/90.  Lipid Assessment/Plan:      Based on NCEP/ATP III, the patient's risk factor category is "history of coronary disease,  peripheral vascular disease, cerebrovascular disease, or aortic aneurysm".  The patient's lipid goals are as follows: Total cholesterol goal is 200; LDL cholesterol goal is 100; HDL cholesterol goal is 40; Triglyceride goal is 150.  Her LDL cholesterol goal has been met.    Patient Instructions: 1)  needs a CPX in 3 months with all labs prior add vit d to labs 733.00

## 2010-09-02 NOTE — Assessment & Plan Note (Signed)
Summary: 3 WK ROV // RS   Vital Signs:  Patient profile:   77 year old female Height:      64 inches Weight:      182 pounds BMI:     31.35 Temp:     98.2 degrees F oral Pulse rate:   76 / minute Resp:     14 per minute BP sitting:   130 / 80  (left arm)  Vitals Entered By: Willy Eddy, LPN (September 11, 2009 11:39 AM)  Nutrition Counseling: Patient's BMI is greater than 25 and therefore counseled on weight management options. CC: Hypertension Management, Lipid Management   CC:  Hypertension Management and Lipid Management.  History of Present Illness: the pt presents for follow up of HTN and hyperlipidemia genral feels well GERD is the main isue with episodic nausea and vomiting ( rare episodes) the bowels are not well regulated has 4-5 stools a day and has "runs" on the 4-5 stool  Hypertension History:      She denies headache, chest pain, palpitations, dyspnea with exertion, orthopnea, PND, peripheral edema, visual symptoms, neurologic problems, syncope, and side effects from treatment.        Positive major cardiovascular risk factors include female age 71 years old or older, hyperlipidemia, and hypertension.  Negative major cardiovascular risk factors include no history of diabetes, negative family history for ischemic heart disease, and non-tobacco-user status.        Positive history for target organ damage include peripheral vascular disease.  Further assessment for target organ damage reveals no history of ASHD or stroke/TIA.    Lipid Management History:      Positive NCEP/ATP III risk factors include female age 63 years old or older, HDL cholesterol less than 40, hypertension, and peripheral vascular disease.  Negative NCEP/ATP III risk factors include no history of early menopause without estrogen hormone replacement, non-diabetic, no family history for ischemic heart disease, non-tobacco-user status, no ASHD (atherosclerotic heart disease), no prior stroke/TIA,  and no history of aortic aneurysm.      Problems Prior to Update: 1)  Neck and Back Pain  (ICD-723.1) 2)  Varicose Veins Lower Extremities W/inflammation  (ICD-454.1) 3)  Edema  (ICD-782.3) 4)  Other Acute Reactions To Stress  (ICD-308.3) 5)  Coumadin Therapy  (ICD-V58.61) 6)  Other Retroperitoneal Abscess  (ICD-567.38) 7)  Other Dysphagia  (ICD-787.29) 8)  Weakness  (ICD-780.79) 9)  Anemia-nos  (ICD-285.9) 10)  Depression  (ICD-311) 11)  Anxiety  (ICD-300.00) 12)  Peripheral Vascular Disease  (ICD-443.9) 13)  Atrial Fibrillation  (ICD-427.31) 14)  Hypoxemia  (ICD-799.02) 15)  Diverticulitis of Colon With Hemorrhage  (ICD-562.13) 16)  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  (ICD-569.81) 17)  Dyspnea  (ICD-786.05) 18)  Weight Loss, Abnormal  (ICD-783.21) 19)  Peptic Ulcer Disease  (ICD-533.90) 20)  Diverticulosis, Colon  (ICD-562.10) 21)  Constipation, Chronic  (ICD-564.09) 22)  Loc Osteoarthros Not Spec Whether Prim/sec Hand  (ICD-715.34) 23)  COPD  (ICD-496) 24)  Hair Loss  (ICD-704.00) 25)  Night Sweats  (ICD-780.8) 26)  Osteoarthrosis, Local Nos, Lower Leg  (ICD-715.36) 27)  Osteoarthritis  (ICD-715.90) 28)  Gerd  (ICD-530.81) 29)  Transient Ischemic Attack, Hx of  (ICD-V12.50) 30)  Cerebrovascular Accident, Hx of  (ICD-V12.50) 31)  Hypertension  (ICD-401.9) 32)  Hyperlipidemia  (ICD-272.4)  Medications Prior to Update: 1)  Vitamin D 69629 Unit  Caps (Ergocalciferol) .... One By Mouth Weekly With Food Hold 2)  Spiriva Handihaler 18 Mcg Caps (  Tiotropium Bromide Monohydrate) .Marland Kitchen.. 1 Puff Once Daily 3)  Prilosec 40 Mg Cpdr (Omeprazole) .Marland Kitchen.. 1 Once Daily As Needed 4)  Cartia Xt 180 Mg Xr24h-Cap (Diltiazem Hcl Coated Beads) .... One By Mouth  Daily 5)  Warfarin Sodium 5 Mg Tabs (Warfarin Sodium) .... 1.5 Once Daily 6)  Alprazolam 0.5 Mg Tabs (Alprazolam) .... One By Mouth Tid 7)  Bisoprolol Fumarate 5 Mg Tabs (Bisoprolol Fumarate) .... One By Mouth Daily 8)   Clobetasol Propionate 0.05 % Foam (Clobetasol Propionate) .... Apply To Scalp Every Other Day 9)  Altibax .... Apply To Area Bid 10)  Smz-Tmp Ds 800-160 Mg Tabs (Sulfamethoxazole-Trimethoprim) .... Two Times A Day For 5 Days  Current Medications (verified): 1)  Vitamin D 04540 Unit  Caps (Ergocalciferol) .... One By Mouth Weekly With Food Hold 2)  Spiriva Handihaler 18 Mcg Caps (Tiotropium Bromide Monohydrate) .Marland Kitchen.. 1 Puff Once Daily 3)  Prilosec 40 Mg Cpdr (Omeprazole) .Marland Kitchen.. 1 Once Daily As Needed 4)  Cartia Xt 180 Mg Xr24h-Cap (Diltiazem Hcl Coated Beads) .... One By Mouth  Daily 5)  Warfarin Sodium 5 Mg Tabs (Warfarin Sodium) .... 1.5 Once Daily 6)  Alprazolam 0.5 Mg Tabs (Alprazolam) .... One By Mouth Tid 7)  Bisoprolol Fumarate 5 Mg Tabs (Bisoprolol Fumarate) .... One By Mouth Daily 8)  Clobetasol Propionate 0.05 % Foam (Clobetasol Propionate) .... Apply To Scalp Every Other Day 9)  Benefiber Drink Mix  Powd (Wheat Dextrin) .... Use It Daily  Allergies (verified): 1)  ! Darvon  Past History:  Family History: Last updated: 03/18/2007 Family History Hypertension Fam hx Stroke  Social History: Last updated: 03/18/2007 Former Smoker Alcohol use-no Drug use-no Retired Married Regular exercise-no  Risk Factors: Exercise: no (03/18/2007)  Risk Factors: Smoking Status: quit (08/21/2009) Packs/Day: 2.0 (08/21/2009) Passive Smoke Exposure: yes (08/21/2009)  Past medical, surgical, family and social histories (including risk factors) reviewed, and no changes noted (except as noted below).  Past Medical History: Reviewed history from 10/20/2008 and no changes required. 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 Family History Hypertension Fam hx Stroke  Social History: Reviewed history from 03/18/2007 and no changes required. Former Smoker Alcohol use-no Drug use-no Retired Married Regular exercise-no  Review of Systems  The patient denies anorexia, fever, weight loss, weight gain, vision loss, decreased hearing, hoarseness, chest pain, syncope, dyspnea on exertion, peripheral edema, prolonged cough, headaches, hemoptysis, abdominal pain, melena, hematochezia, severe indigestion/heartburn, hematuria, incontinence, genital sores, muscle weakness, suspicious skin lesions, transient blindness, difficulty walking, depression, unusual weight change, abnormal bleeding, enlarged lymph nodes, angioedema, and breast masses.    Physical Exam  General:  alert and underweight appearing.,pale.   Head:  normocephalic and atraumatic.  scalp with scaling lesions Eyes:  vision grossly intact, pupils equal, and pupils round.   Ears:  R ear normal and L ear normal.   Nose:  no external deformity and no nasal discharge.   Mouth:  pharynx pink and moist and no erythema.   Neck:  supple and no masses.   Lungs:  R decreased breath sounds  and L decreased breath sounds.  ,. no wheeze, rales noted Heart:  normal rate, regular rhythm, and Grade   1/6 systolic ejection murmur.   Abdomen:  distended and epigastric tenderness.   Extremities:  trace left pedal edema and trace right pedal edema.  hose on for veins     Impression & Recommendations:  Problem # 1:  CONSTIPATION, CHRONIC (ICD-564.09)  changes to diarrhea  Discussed dietary fiber measures and increased water intake.    Her updated medication list for this problem includes:    Benefiber Drink Mix Powd (Wheat dextrin) ..... Use it daily  Problem # 2:  HYPERTENSION (ICD-401.9)  Her updated medication list for this problem includes:    Cartia Xt 180 Mg Xr24h-cap (Diltiazem hcl coated beads) ..... One by mouth  daily    Bisoprolol Fumarate 5 Mg Tabs (Bisoprolol fumarate) ..... One by mouth daily  BP today: 130/80 Prior BP: 134/72 (08/21/2009)  10 Yr Risk Heart Disease: 15 % Prior 10 Yr Risk Heart Disease: 24 % (05/31/2009)  Labs Reviewed: K+: 4.6 (08/21/2009) Creat: : 1.1 (08/21/2009)   Chol: 105 (06/04/2008)   HDL: 23.5 (06/04/2008)   LDL: 58 (06/04/2008)   TG: 119 (06/04/2008)  Problem # 3:  ATRIAL FIBRILLATION (ICD-427.31)  Her updated medication list for this problem includes:    Cartia Xt 180 Mg Xr24h-cap (Diltiazem hcl coated beads) ..... One by mouth  daily    Warfarin Sodium 5 Mg Tabs (Warfarin sodium) .Marland Kitchen... 1.5 once daily    Bisoprolol Fumarate 5 Mg Tabs (Bisoprolol fumarate) ..... One by mouth daily  Reviewed the following: PT: 16.8 (08/21/2009)   INR: 1.9 (08/21/2009) Next Protime: 1 month (dated on 08/21/2009)  Problem # 4:  DIARRHEA (ICD-787.91) worrisome with hx of post obstructive diarrhea bu may have a short colon syndroma as well  Complete Medication List: 1)  Vitamin D 50093 Unit Caps (Ergocalciferol) .... One by mouth weekly with food hold 2)  Spiriva Handihaler 18 Mcg Caps (Tiotropium bromide monohydrate) .Marland Kitchen.. 1 puff once daily 3)  Prilosec 40 Mg Cpdr (Omeprazole) .Marland Kitchen.. 1 once daily as needed 4)  Cartia Xt 180 Mg Xr24h-cap (Diltiazem hcl coated beads) .... One by mouth  daily 5)  Warfarin Sodium 5 Mg Tabs (Warfarin sodium) .... 1.5 once daily 6)  Alprazolam 0.5 Mg Tabs (Alprazolam) .... One by mouth tid 7)  Bisoprolol Fumarate 5 Mg Tabs (Bisoprolol fumarate) .... One by mouth daily 8)  Clobetasol Propionate 0.05 % Foam (Clobetasol propionate) .... Apply to scalp every  other day 9)  Benefiber Drink Mix Powd (Wheat dextrin) .... Use it daily  Hypertension Assessment/Plan:      The patient's hypertensive risk group is category C: Target organ damage and/or diabetes.  Her calculated 10 year risk of coronary heart disease is 15 %.  Today's blood pressure is 130/80.  Her blood pressure goal is < 140/90.  Lipid Assessment/Plan:      Based on NCEP/ATP III, the patient's risk factor category is "history of coronary disease, peripheral vascular disease, cerebrovascular disease, or aortic aneurysm".  The patient's lipid goals are as follows: Total cholesterol goal is 200; LDL cholesterol goal is 130; HDL cholesterol goal is 40; Triglyceride goal is 150.    Patient Instructions: 1)  Please schedule a follow-up appointment in 2 months. 2)  CBC w/ Diff prior to visit, ICD-9:280.9

## 2010-09-02 NOTE — Progress Notes (Signed)
Summary: dysuria  Phone Note Call from Patient Call back at Home Phone [phone]   Caller: Patient Call For: Stacie Glaze MD Summary of Call: Pt is calling with symptoms of dysuria.  Leaving for [place] in the am. Initial call taken by: Lynann Beaver CMA,  April 25, 2010 9:20 AM  Follow-up for Phone Call        may have cipro 500 two times a day for 3 days Follow-up by: Willy Eddy, LPN,  April 25, 2010 10:59 AM    New/Updated Medications: CIPRO 500 MG TABS (CIPROFLOXACIN HCL) one by mouth two times a day x 3 days Prescriptions: CIPRO 500 MG TABS (CIPROFLOXACIN HCL) one by mouth two times a day x 3 days  #5 x 0   Entered by:   Lynann Beaver CMA   Authorized by:   Stacie Glaze MD   Signed by:   Lynann Beaver CMA on 04/25/2010   Method used:   Electronically to        [facility]  [phone]* (retail)       [address]       [place], [place]  [address]       Ph: [phone] or [phone]       Fax: [phone]   RxID:   [id number]  Pt will come today for PT and UA.

## 2010-09-02 NOTE — Letter (Signed)
Summary: [facility]   Imported By: Maryln Gottron 05/15/2010 13:45:29  _____________________________________________________________________  External Attachment:    Type:   Image     Comment:   External Document

## 2010-09-02 NOTE — Progress Notes (Signed)
Summary: Nuclear Pre-Procedure  Phone Note Outgoing Call Call back at [facility] Phone [phone]   Call placed by: Stanton Kidney, EMT-P,  Dec 31, 2009 1:45 PM Action Taken: Phone Call Completed Summary of Call: Reviewed information on [employer] Information Sheet (see scanned document for further details).  Spoke with Patient's husband.    Nuclear Med Background Indications for Stress Test: Evaluation for Ischemia, Post Hospital  Indications Comments: 12/23/09 [facility]: CP, (-) enzymes  History: COPD, Echo  History Comments: 3/10 Echo: EF= 60%  Symptoms: Chest Pain    Nuclear Pre-Procedure Cardiac Risk Factors: CVA, Family History - CAD, Hypertension, Lipids, PVD, TIA Height (in): 64

## 2010-09-02 NOTE — Progress Notes (Signed)
  Phone Note Call from Patient   Caller: Patient Call For: Stacie Glaze MD Summary of Call: Pt is complaining of a pouch at the site of her surgery with some sluggish BMs.  Sounds like an umbilical hernia.  Per Dr.Jenkins, use a stool softner, and keep appt with Dr. Lovell Sheehan Thursday.  Pt. notified. Initial call taken by: Lynann Beaver CMA,  April 01, 2010 12:55 PM

## 2010-09-02 NOTE — Progress Notes (Signed)
Summary: REQ FOR LAB RESULTS  Phone Note Call from Patient   Caller: Patient Reason for Call: Acute Illness, Talk to Nurse, Talk to Doctor Summary of Call: Pt called to req a return call concerning the results of recent labs...Marland KitchenMarland KitchenMarland Kitchen Pt adv that she can be reached @ [phone].  Initial call taken by: Debbra Riding,  August 23, 2009 8:33 AM  Follow-up for Phone Call        pt informed all labs look good Follow-up by: Willy Eddy, LPN,  August 23, 2009 8:59 AM

## 2010-09-02 NOTE — Miscellaneous (Signed)
Summary: Discharge Summary for PT [facility]  Discharge Summary for PT Services/[facility]   Imported By: Maryln Gottron 04/21/2010 13:18:04  _____________________________________________________________________  External Attachment:    Type:   Image     Comment:   External Document

## 2010-09-02 NOTE — Progress Notes (Signed)
Summary: refill  Phone Note Refill Request Call back at Home Phone [phone] Message from:  Patient---live call  Refills Requested: Medication #1:  SPIRIVA HANDIHALER 18 MCG CAPS 1 puff once daily send to [facility] on [address].  Initial call taken by: Warnell Forester,  May 23, 2010 3:24 PM    Prescriptions: Hollie Beach 18 MCG CAPS (TIOTROPIUM BROMIDE MONOHYDRATE) 1 puff once daily  #30 Each x 5   Entered by:   Willy Eddy, LPN   Authorized by:   Stacie Glaze MD   Signed by:   Willy Eddy, LPN on 46/96/2952   Method used:   Electronically to        [facility]  [phone]* (retail)       [address]       [place], [place]  [address]       Ph: [phone] or [phone]       Fax: [phone]   RxID:   [id number]

## 2010-09-02 NOTE — Assessment & Plan Note (Signed)
Summary: 2 month rov/njr   Vital Signs:  Patient profile:   77 year old female Height:      64 inches Weight:      196 pounds BMI:     33.76 Temp:     98.2 degrees F oral Pulse rate:   72 / minute Resp:     14 per minute BP sitting:   124 / 80  (left arm)  Vitals Entered By: Willy Eddy, LPN (July 02, 2010 11:34 AM) CC: roa -discuss meds, Hypertension Management Is Patient Diabetic? No   Primary Care Jamiyla Ishee:  Stacie Glaze MD  CC:  roa -discuss meds and Hypertension Management.  History of Present Illness: feels generally well Lost her sister due to cancer of colon  she had a colon resection and has dumping symdrome she is not taking the fiber daily  the pt has mess in her abd that pulls knee pain has been stable   Hypertension History:      She denies headache, chest pain, palpitations, dyspnea with exertion, orthopnea, PND, peripheral edema, visual symptoms, neurologic problems, syncope, and side effects from treatment.        Positive major cardiovascular risk factors include female age 15 years old or older, hyperlipidemia, and hypertension.  Negative major cardiovascular risk factors include no history of diabetes, negative family history for ischemic heart disease, and non-tobacco-user status.        Positive history for target organ damage include peripheral vascular disease.  Further assessment for target organ damage reveals no history of ASHD or stroke/TIA.     Preventive Screening-Counseling & Management  Alcohol-Tobacco     Smoking Status: quit     Packs/Day: 2.0     Year Started: 1944     Year Quit: 1986     Passive Smoke Exposure: yes  Problems Prior to Update: 1)  Abdominal Incisional Hernia  (ICD-553.21) 2)  Family History of Colon Ca 1st Degree Relative <60  (ICD-V16.0) 3)  Preventive Health Care  (ICD-V70.0) 4)  Knee Joint Replacement By Other Means  (ICD-V43.65) 5)  Chest Pain  (ICD-786.50) 6)  Encounter For Therapeutic Drug  Monitoring  (ICD-V58.83) 7)  Diarrhea  (ICD-787.91) 8)  Neck and Back Pain  (ICD-723.1) 9)  Varicose Veins Lower Extremities W/inflammation  (ICD-454.1) 10)  Edema  (ICD-782.3) 11)  Other Acute Reactions To Stress  (ICD-308.3) 12)  Coumadin Therapy  (ICD-V58.61) 13)  Other Retroperitoneal Abscess  (ICD-567.38) 14)  Other Dysphagia  (ICD-787.29) 15)  Weakness  (ICD-780.79) 16)  Anemia-nos  (ICD-285.9) 17)  Depression  (ICD-311) 18)  Anxiety  (ICD-300.00) 19)  Peripheral Vascular Disease  (ICD-443.9) 20)  Atrial Fibrillation  (ICD-427.31) 21)  Hypoxemia  (ICD-799.02) 22)  Diverticulitis of Colon With Hemorrhage  (ICD-562.13) 23)  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  (ICD-569.81) 24)  Dyspnea  (ICD-786.05) 25)  Weight Loss, Abnormal  (ICD-783.21) 26)  Peptic Ulcer Disease  (ICD-533.90) 27)  Diverticulosis, Colon  (ICD-562.10) 28)  Constipation, Chronic  (ICD-564.09) 29)  Loc Osteoarthros Not Spec Whether Prim/sec Hand  (ICD-715.34) 30)  COPD  (ICD-496) 31)  Hair Loss  (ICD-704.00) 32)  Night Sweats  (ICD-780.8) 33)  Osteoarthrosis, Local Nos, Lower Leg  (ICD-715.36) 34)  Osteoarthritis  (ICD-715.90) 35)  Gerd  (ICD-530.81) 36)  Transient Ischemic Attack, Hx of  (ICD-V12.50) 37)  Cerebrovascular Accident, Hx of  (ICD-V12.50) 38)  Hypertension  (ICD-401.9) 39)  Hyperlipidemia  (ICD-272.4)  Current Problems (verified): 1)  Abdominal Incisional Hernia  (  EAV-409.81) 2)  Family History of Colon Ca 1st Degree Relative <60  (ICD-V16.0) 3)  Preventive Health Care  (ICD-V70.0) 4)  Knee Joint Replacement By Other Means  (ICD-V43.65) 5)  Chest Pain  (ICD-786.50) 6)  Encounter For Therapeutic Drug Monitoring  (ICD-V58.83) 7)  Diarrhea  (ICD-787.91) 8)  Neck and Back Pain  (ICD-723.1) 9)  Varicose Veins Lower Extremities W/inflammation  (ICD-454.1) 10)  Edema  (ICD-782.3) 11)  Other Acute Reactions To Stress  (ICD-308.3) 12)  Coumadin Therapy  (ICD-V58.61) 13)  Other  Retroperitoneal Abscess  (ICD-567.38) 14)  Other Dysphagia  (ICD-787.29) 15)  Weakness  (ICD-780.79) 16)  Anemia-nos  (ICD-285.9) 17)  Depression  (ICD-311) 18)  Anxiety  (ICD-300.00) 19)  Peripheral Vascular Disease  (ICD-443.9) 20)  Atrial Fibrillation  (ICD-427.31) 21)  Hypoxemia  (ICD-799.02) 22)  Diverticulitis of Colon With Hemorrhage  (ICD-562.13) 23)  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  (ICD-569.81) 24)  Dyspnea  (ICD-786.05) 25)  Weight Loss, Abnormal  (ICD-783.21) 26)  Peptic Ulcer Disease  (ICD-533.90) 27)  Diverticulosis, Colon  (ICD-562.10) 28)  Constipation, Chronic  (ICD-564.09) 29)  Loc Osteoarthros Not Spec Whether Prim/sec Hand  (ICD-715.34) 30)  COPD  (ICD-496) 31)  Hair Loss  (ICD-704.00) 32)  Night Sweats  (ICD-780.8) 33)  Osteoarthrosis, Local Nos, Lower Leg  (ICD-715.36) 34)  Osteoarthritis  (ICD-715.90) 35)  Gerd  (ICD-530.81) 36)  Transient Ischemic Attack, Hx of  (ICD-V12.50) 37)  Cerebrovascular Accident, Hx of  (ICD-V12.50) 38)  Hypertension  (ICD-401.9) 39)  Hyperlipidemia  (ICD-272.4)  Medications Prior to Update: 1)  Ergocalciferol 50000 Unit Caps (Ergocalciferol) .... One By Mouth Weekly 2)  Spiriva Handihaler 18 Mcg Caps (Tiotropium Bromide Monohydrate) .Marland Kitchen.. 1 Puff Once Daily 3)  Prilosec 40 Mg Cpdr (Omeprazole) .Marland Kitchen.. 1 Once Daily As Needed 4)  Cartia Xt 180 Mg Xr24h-Cap (Diltiazem Hcl Coated Beads) .... One By Mouth  Daily 5)  Warfarin Sodium 5 Mg Tabs (Warfarin Sodium) .... 1.5 Once Daily 6)  Alprazolam 0.5 Mg Tabs (Alprazolam) .... One By Mouth Three Times A Day As Needed 7)  Bisoprolol Fumarate 5 Mg Tabs (Bisoprolol Fumarate) .... One By Mouth Daily 8)  Clobetasol Propionate 0.05 % Foam (Clobetasol Propionate) .... Apply To Scalp Every Other Day 9)  Cipro 500 Mg Tabs (Ciprofloxacin Hcl) .... One By Mouth Two Times A Day X 3 Days  Current Medications (verified): 1)  Ergocalciferol 50000 Unit Caps (Ergocalciferol) .... One By Mouth  Weekly 2)  Spiriva Handihaler 18 Mcg Caps (Tiotropium Bromide Monohydrate) .Marland Kitchen.. 1 Puff Once Daily 3)  Prilosec 40 Mg Cpdr (Omeprazole) .Marland Kitchen.. 1 Once Daily As Needed 4)  Cartia Xt 180 Mg Xr24h-Cap (Diltiazem Hcl Coated Beads) .... One By Mouth  Daily 5)  Warfarin Sodium 5 Mg Tabs (Warfarin Sodium) .... 1.5 Once Daily 6)  Alprazolam 0.5 Mg Tabs (Alprazolam) .... One By Mouth Three Times A Day As Needed 7)  Bisoprolol Fumarate 5 Mg Tabs (Bisoprolol Fumarate) .... One By Mouth Daily  Allergies (verified): 1)  ! Darvon 2)  ! Darvocet  Past History:  Family History: Last updated: 04/03/2010 Family History Hypertension Fam hx Stroke Family History of Colon CA 1st degree relative <60 SISTER WITH STAGE 4  Social History: Last updated: 03/18/2007 Former Smoker Alcohol use-no Drug use-no Retired Married Regular exercise-no  Risk Factors: Exercise: no (03/18/2007)  Risk Factors: Smoking Status: quit (07/02/2010) Packs/Day: 2.0 (07/02/2010) Passive Smoke Exposure: yes (07/02/2010)  Past medical, surgical, family and social histories (including risk factors) reviewed,  and no changes noted (except as noted below).  Past Medical History: Reviewed history from 10/20/2008 and no changes required. 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 Family History Hypertension Fam hx Stroke Family History  of Colon CA 1st degree relative <60 SISTER WITH STAGE 4  Social History: Reviewed history from 03/18/2007 and no changes required. Former Smoker Alcohol use-no Drug use-no Retired Married Regular exercise-no  Review of Systems  The patient denies anorexia, fever, weight loss, weight gain, vision loss, decreased hearing, hoarseness, chest pain, syncope, dyspnea on exertion, peripheral edema, prolonged cough, headaches, hemoptysis, abdominal pain, melena, hematochezia, severe indigestion/heartburn, hematuria, incontinence, genital sores, muscle weakness, suspicious skin lesions, transient blindness, difficulty walking, depression, unusual weight change, abnormal bleeding, enlarged lymph nodes, angioedema, and breast masses.    Physical Exam  General:  alert and overweight-appearing.   Head:  normocephalic and atraumatic.  scalp with scaling lesionsnormocephalic and atraumatic.   Eyes:  vision grossly intact, pupils equal, and pupils round.   Ears:  R ear normal and L ear normal.   Nose:  no external deformity and no nasal discharge.   Mouth:  pharynx pink and moist and no erythema.   Neck:  supple and no masses.   Lungs:  normal respiratory effort, no dullness, and R wheezes.   Heart:  normal rate, regular rhythm, and Grade   1/6 systolic ejection murmur.   Abdomen:  soft, normal bowel sounds, and incisional hernia.   Extremities:  trace left pedal edema and trace right pedal edema.   Neurologic:  alert & oriented X3 and finger-to-nose normal.     Impression & Recommendations:  Problem # 1:  DIARRHEA (ICD-787.91)  Discussed symptom control and diet. Call if worsening of symptoms or signs of dehydration.   Orders: Venipuncture (81191) TLB-Hepatic/Liver Function Pnl (80076-HEPATIC)  Problem # 2:  DEPRESSION (ICD-311)  Her updated medication list for this problem includes:    Alprazolam 0.5 Mg Tabs (Alprazolam) ..... One by mouth three times a day as needed  Discussed  treatment options, including trial of antidpressant medication. Will refer to behavioral health. Follow-up call in in 24-48 hours and recheck in 2 weeks, sooner as needed. Patient agrees to call if any worsening of symptoms or thoughts of doing harm arise. Verified that the patient has no suicidal ideation at this time.   Problem # 3:  ANEMIA-NOS (ICD-285.9)  Hgb: 12.6 (02/13/2010)   Hct: 37.4 (02/13/2010)   Platelets: 238.0 (02/13/2010) RBC: 4.38 (02/13/2010)   RDW: 15.7 (02/13/2010)   WBC: 4.9 (02/13/2010) MCV: 85.4 (02/13/2010)   MCHC: 33.7 (02/13/2010) TSH: 1.61 (02/13/2010)  Orders: TLB-CBC Platelet - w/Differential (85025-CBCD) TLB-IBC Pnl (Iron/FE;Transferrin) (83550-IBC)  Problem # 4:  ABDOMINAL INCISIONAL HERNIA (ICD-553.21) with some mild discomfort  Problem # 5:  COPD (ICD-496)  Her updated medication list for this problem includes:    Spiriva Handihaler 18 Mcg Caps (Tiotropium bromide monohydrate) .Marland Kitchen... 1 puff once daily  Pulmonary Functions Reviewed: O2 sat: 96 (10/20/2008)     Problem # 6:  HYPERTENSION (ICD-401.9)  Her updated medication list  for this problem includes:    Cartia Xt 180 Mg Xr24h-cap (Diltiazem hcl coated beads) ..... One by mouth  daily    Bisoprolol Fumarate 5 Mg Tabs (Bisoprolol fumarate) ..... One by mouth daily  BP today: 124/80 Prior BP: 140/80 (04/03/2010)  Prior 10 Yr Risk Heart Disease: 15 % (09/11/2009)  Labs Reviewed: K+: 4.4 (02/13/2010) Creat: : 1.1 (02/13/2010)   Chol: 176 (02/13/2010)   HDL: 31.90 (02/13/2010)   LDL: 58 (06/04/2008)   TG: 282.0 (02/13/2010)  Complete Medication List: 1)  Ergocalciferol 50000 Unit Caps (Ergocalciferol) .... One by mouth weekly 2)  Spiriva Handihaler 18 Mcg Caps (Tiotropium bromide monohydrate) .Marland Kitchen.. 1 puff once daily 3)  Prilosec 40 Mg Cpdr (Omeprazole) .Marland Kitchen.. 1 once daily as needed 4)  Cartia Xt 180 Mg Xr24h-cap (Diltiazem hcl coated beads) .... One by mouth  daily 5)  Warfarin Sodium 5 Mg Tabs  (Warfarin sodium) .... 1.5 once daily 6)  Alprazolam 0.5 Mg Tabs (Alprazolam) .... One by mouth three times a day as needed 7)  Bisoprolol Fumarate 5 Mg Tabs (Bisoprolol fumarate) .... One by mouth daily  Other Orders: Fingerstick (03474) Protime (25956LO)  Hypertension Assessment/Plan:      The patient's hypertensive risk group is category C: Target organ damage and/or diabetes.  Her calculated 10 year risk of coronary heart disease is 15 %.  Today's blood pressure is 124/80.  Her blood pressure goal is < 140/90.  Patient Instructions: 1)  Please schedule a follow-up appointment in 4 months.   Orders Added: 1)  Fingerstick [36416] 2)  Protime [85610QW] 3)  Est. Patient Level IV [75643] 4)  Venipuncture [36415] 5)  TLB-CBC Platelet - w/Differential [85025-CBCD] 6)  TLB-Hepatic/Liver Function Pnl [80076-HEPATIC] 7)  TLB-IBC Pnl (Iron/FE;Transferrin) [83550-IBC]  Appended Document: 2 month rov/njr   ANTICOAGULATION RECORD PREVIOUS REGIMEN & LAB RESULTS Anticoagulation Diagnosis:  Atrial fibrillation on  04/19/2009 Previous INR Goal Range:  2-3 on  04/19/2009 Previous INR:  2.2 on  04/25/2010 Previous Coumadin Dose(mg):  7.5 qd on  11/21/2009 Previous Regimen:  Same Dose on  04/25/2010 Previous Coagulation Comments:  Patient could not give urine sample. on  04/25/2010  NEW REGIMEN & LAB RESULTS Current INR: 2.6 Regimen: same  Repeat testing in: 4 weeks  Anticoagulation Visit Questionnaire Coumadin dose missed/changed:  No Abnormal Bleeding Symptoms:  No  Any diet changes including alcohol intake, vegetables or greens since the last visit:  No Any illnesses or hospitalizations since the last visit:  No Any signs of clotting since the last visit (including chest discomfort, dizziness, shortness of breath, arm tingling, slurred speech, swelling or redness in leg):  No  MEDICATIONS ERGOCALCIFEROL 50000 UNIT CAPS (ERGOCALCIFEROL) one by mouth weekly SPIRIVA HANDIHALER 18  MCG CAPS (TIOTROPIUM BROMIDE MONOHYDRATE) 1 puff once daily PRILOSEC 40 MG CPDR (OMEPRAZOLE) 1 once daily as needed CARTIA XT 180 MG XR24H-CAP (DILTIAZEM HCL COATED BEADS) one by mouth  daily WARFARIN SODIUM 5 MG TABS (WARFARIN SODIUM) 1.5 once daily ALPRAZOLAM 0.5 MG TABS (ALPRAZOLAM) one by mouth three times a day as needed BISOPROLOL FUMARATE 5 MG TABS (BISOPROLOL FUMARATE) one by mouth daily    Laboratory Results   Blood Tests      INR: 2.6   (Normal Range: 0.88-1.12   Therap INR: 2.0-3.5) Comments: Rita Ohara  July 02, 2010 1:46 PM

## 2010-09-02 NOTE — Assessment & Plan Note (Signed)
Summary: pt/njr/pt rescd/ccm   Nurse Visit   Allergies: 1)  ! Darvon Laboratory Results   Blood Tests     PT: 17.5 s   (Normal Range: 10.6-13.4)  INR: 2.0   (Normal Range: 0.88-1.12   Therap INR: 2.0-3.5) Comments: Rita Ohara  September 20, 2009 2:00 PM     Orders Added: 1)  Est. Patient Level I [99211] 2)  Protime [[id number]]   ANTICOAGULATION RECORD PREVIOUS REGIMEN & LAB RESULTS Anticoagulation Diagnosis:  Atrial fibrillation on  04/19/2009 Previous INR Goal Range:  2-3 on  04/19/2009 Previous INR:  1.9 on  08/21/2009  Previous Regimen:  same on  08/21/2009  NEW REGIMEN & LAB RESULTS Current INR: 2.0 Regimen: same  Repeat testing in: 1 month  Anticoagulation Visit Questionnaire Coumadin dose missed/changed:  No Abnormal Bleeding Symptoms:  No  Any diet changes including alcohol intake, vegetables or greens since the last visit:  No Any illnesses or hospitalizations since the last visit:  No Any signs of clotting since the last visit (including chest discomfort, dizziness, shortness of breath, arm tingling, slurred speech, swelling or redness in leg):  No  MEDICATIONS VITAMIN D 11914 UNIT  CAPS (ERGOCALCIFEROL) one by mouth weekly with food hold SPIRIVA HANDIHALER 18 MCG CAPS (TIOTROPIUM BROMIDE MONOHYDRATE) 1 puff once daily PRILOSEC 40 MG CPDR (OMEPRAZOLE) 1 once daily as needed CARTIA XT 180 MG XR24H-CAP (DILTIAZEM HCL COATED BEADS) one by mouth  daily WARFARIN SODIUM 5 MG TABS (WARFARIN SODIUM) 1.5 once daily ALPRAZOLAM 0.5 MG TABS (ALPRAZOLAM) one by mouth TID BISOPROLOL FUMARATE 5 MG TABS (BISOPROLOL FUMARATE) one by mouth daily CLOBETASOL PROPIONATE 0.05 % FOAM (CLOBETASOL PROPIONATE) apply to scalp every other day BENEFIBER DRINK MIX  POWD (WHEAT DEXTRIN) use it daily

## 2010-09-02 NOTE — Progress Notes (Signed)
Summary: Pt req work in appt with Dr. Lovell Sheehan Only  Phone Note Call from Patient Call back at [facility] Phone [phone]   Caller: Patient Complaint: Breathing Problems Summary of Call: Pt called and said that both feet are swelling really bad and would like a work in appt with Dr. Lovell Sheehan only. Pt refuses to see another doctor.  Initial call taken by: Lucy Antigua,  January 06, 2010 1:42 PM  Follow-up for Phone Call        per dr Esperanza Heir- get stat cbc with diff today and have pt come in wednesday for ov Follow-up by: Willy Eddy, LPN,  January 07, 5283 2:24 PM

## 2010-09-02 NOTE — Letter (Signed)
Summary: Request for Surgical Clearance/[facility]  Request for Surgical Clearance/[facility]   Imported By: Maryln Gottron 05/15/2010 11:03:55  _____________________________________________________________________  External Attachment:    Type:   Image     Comment:   External Document

## 2010-09-02 NOTE — Assessment & Plan Note (Signed)
Summary: cpx/mm   Vital Signs:  Patient profile:   77 year old female Height:      64 inches Weight:      198 pounds BMI:     34.11 Temp:     98.2 degrees F oral Pulse rate:   72 / minute Resp:     14 per minute BP sitting:   146 / 84  (left arm)  Vitals Entered By: Willy Eddy, LPN (February 21, 2010 11:27 AM) CC: annual visit for disease managment Is Patient Diabetic? No   CC:  annual visit for disease managment.  History of Present Illness: The pt was asked about all immunizations, health maint. services that are appropriate to their age and was given guidance on diet exercize  and weight management   Preventive Screening-Counseling & Management  Alcohol-Tobacco     Smoking Status: quit     Packs/Day: 2.0     Year Started: 1944     Year Quit: 1986     Passive Smoke Exposure: yes  Problems Prior to Update: 1)  Preventive Health Care  (ICD-V70.0) 2)  Knee Joint Replacement By Other Means  (ICD-V43.65) 3)  Chest Pain  (ICD-786.50) 4)  Encounter For Therapeutic Drug Monitoring  (ICD-V58.83) 5)  Diarrhea  (ICD-787.91) 6)  Neck and Back Pain  (ICD-723.1) 7)  Varicose Veins Lower Extremities W/inflammation  (ICD-454.1) 8)  Edema  (ICD-782.3) 9)  Other Acute Reactions To Stress  (ICD-308.3) 10)  Coumadin Therapy  (ICD-V58.61) 11)  Other Retroperitoneal Abscess  (ICD-567.38) 12)  Other Dysphagia  (ICD-787.29) 13)  Weakness  (ICD-780.79) 14)  Anemia-nos  (ICD-285.9) 15)  Depression  (ICD-311) 16)  Anxiety  (ICD-300.00) 17)  Peripheral Vascular Disease  (ICD-443.9) 18)  Atrial Fibrillation  (ICD-427.31) 19)  Hypoxemia  (ICD-799.02) 20)  Diverticulitis of Colon With Hemorrhage  (ICD-562.13) 21)  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  (ICD-569.81) 22)  Dyspnea  (ICD-786.05) 23)  Weight Loss, Abnormal  (ICD-783.21) 24)  Peptic Ulcer Disease  (ICD-533.90) 25)  Diverticulosis, Colon  (ICD-562.10) 26)  Constipation, Chronic  (ICD-564.09) 27)  Loc Osteoarthros  Not Spec Whether Prim/sec Hand  (ICD-715.34) 28)  COPD  (ICD-496) 29)  Hair Loss  (ICD-704.00) 30)  Night Sweats  (ICD-780.8) 31)  Osteoarthrosis, Local Nos, Lower Leg  (ICD-715.36) 32)  Osteoarthritis  (ICD-715.90) 33)  Gerd  (ICD-530.81) 34)  Transient Ischemic Attack, Hx of  (ICD-V12.50) 35)  Cerebrovascular Accident, Hx of  (ICD-V12.50) 36)  Hypertension  (ICD-401.9) 37)  Hyperlipidemia  (ICD-272.4)  Current Problems (verified): 1)  Chest Pain  (ICD-786.50) 2)  Encounter For Therapeutic Drug Monitoring  (ICD-V58.83) 3)  Diarrhea  (ICD-787.91) 4)  Neck and Back Pain  (ICD-723.1) 5)  Varicose Veins Lower Extremities W/inflammation  (ICD-454.1) 6)  Edema  (ICD-782.3) 7)  Other Acute Reactions To Stress  (ICD-308.3) 8)  Coumadin Therapy  (ICD-V58.61) 9)  Other Retroperitoneal Abscess  (ICD-567.38) 10)  Other Dysphagia  (ICD-787.29) 11)  Weakness  (ICD-780.79) 12)  Anemia-nos  (ICD-285.9) 13)  Depression  (ICD-311) 14)  Anxiety  (ICD-300.00) 15)  Peripheral Vascular Disease  (ICD-443.9) 16)  Atrial Fibrillation  (ICD-427.31) 17)  Hypoxemia  (ICD-799.02) 18)  Diverticulitis of Colon With Hemorrhage  (ICD-562.13) 19)  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  (ICD-569.81) 20)  Dyspnea  (ICD-786.05) 21)  Weight Loss, Abnormal  (ICD-783.21) 22)  Peptic Ulcer Disease  (ICD-533.90) 23)  Diverticulosis, Colon  (ICD-562.10) 24)  Constipation, Chronic  (ICD-564.09) 25)  Loc  Osteoarthros Not Spec Whether Prim/sec Hand  (ICD-715.34) 26)  COPD  (ICD-496) 27)  Hair Loss  (ICD-704.00) 28)  Night Sweats  (ICD-780.8) 29)  Osteoarthrosis, Local Nos, Lower Leg  (ICD-715.36) 30)  Osteoarthritis  (ICD-715.90) 31)  Gerd  (ICD-530.81) 32)  Transient Ischemic Attack, Hx of  (ICD-V12.50) 33)  Cerebrovascular Accident, Hx of  (ICD-V12.50) 34)  Hypertension  (ICD-401.9) 35)  Hyperlipidemia  (ICD-272.4)  Medications Prior to Update: 1)  Ergocalciferol 50000 Unit Caps (Ergocalciferol) ....  One By Mouth Weekly 2)  Spiriva Handihaler 18 Mcg Caps (Tiotropium Bromide Monohydrate) .Marland Kitchen.. 1 Puff Once Daily 3)  Prilosec 40 Mg Cpdr (Omeprazole) .Marland Kitchen.. 1 Once Daily As Needed 4)  Cartia Xt 180 Mg Xr24h-Cap (Diltiazem Hcl Coated Beads) .... One By Mouth  Daily 5)  Warfarin Sodium 5 Mg Tabs (Warfarin Sodium) .... 1.5 Once Daily 6)  Alprazolam 0.5 Mg Tabs (Alprazolam) .... One By Mouth Tid 7)  Bisoprolol Fumarate 5 Mg Tabs (Bisoprolol Fumarate) .... One By Mouth Daily 8)  Clobetasol Propionate 0.05 % Foam (Clobetasol Propionate) .... Apply To Scalp Every Other Day 9)  Benefiber Drink Mix  Powd (Wheat Dextrin) .... Use It Daily  Current Medications (verified): 1)  Ergocalciferol 50000 Unit Caps (Ergocalciferol) .... One By Mouth Weekly 2)  Spiriva Handihaler 18 Mcg Caps (Tiotropium Bromide Monohydrate) .Marland Kitchen.. 1 Puff Once Daily 3)  Prilosec 40 Mg Cpdr (Omeprazole) .Marland Kitchen.. 1 Once Daily As Needed 4)  Cartia Xt 180 Mg Xr24h-Cap (Diltiazem Hcl Coated Beads) .... One By Mouth  Daily 5)  Warfarin Sodium 5 Mg Tabs (Warfarin Sodium) .... 1.5 Once Daily 6)  Alprazolam 0.5 Mg Tabs (Alprazolam) .... One By Mouth Three Times A Day As Needed 7)  Bisoprolol Fumarate 5 Mg Tabs (Bisoprolol Fumarate) .... One By Mouth Daily 8)  Clobetasol Propionate 0.05 % Foam (Clobetasol Propionate) .... Apply To Scalp Every Other Day  Allergies (verified): 1)  ! Darvon 2)  ! Darvocet  Contraindications/Deferment of Procedures/Staging:    Test/Procedure: Pneumovax vaccine    Reason for deferment: patient declined     Test/Procedure: PAP Smear    Reason for deferment: patient declined     Test/Procedure: DPT vaccine    Reason for deferment: declined     Test/Procedure: TD vaccine    Reason for deferment: declined   Past History:  Family History: Last updated: 03/18/2007 Family History Hypertension Fam hx Stroke  Social History: Last updated: 03/18/2007 Former Smoker Alcohol use-no Drug  use-no Retired Married Regular exercise-no  Risk Factors: Exercise: no (03/18/2007)  Risk Factors: Smoking Status: quit (02/21/2010) Packs/Day: 2.0 (02/21/2010) Passive Smoke Exposure: yes (02/21/2010)  Past medical, surgical, family and social histories (including risk factors) reviewed, and no changes noted (except as noted below).  Past Medical History: Reviewed history from 10/20/2008 and no changes required. 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 Family History Hypertension Fam hx Stroke  Social History: Reviewed history from 03/18/2007 and no changes required. Former Smoker Alcohol use-no Drug use-no Retired Married Regular exercise-no  Review of Systems  The patient complains of weight gain and peripheral edema.  The patient denies anorexia, fever, weight loss, vision loss, decreased hearing, hoarseness, chest pain, syncope, dyspnea on exertion, prolonged cough, headaches, hemoptysis, abdominal pain, melena, hematochezia, severe indigestion/heartburn, hematuria, incontinence, genital sores, muscle weakness, suspicious skin lesions, transient blindness, difficulty walking, depression, unusual weight change, abnormal bleeding, enlarged lymph nodes, angioedema, and breast masses.    Physical Exam  General:  alert and underweight appearing.,pale.   Head:  normocephalic  and atraumatic.  scalp with scaling lesions Eyes:  vision grossly intact, pupils equal, and pupils round.   Ears:  R ear normal and L ear normal.   Nose:  no external deformity and no nasal discharge.   Mouth:  pharynx pink and moist and no erythema.   Neck:  supple and no masses.   Chest Wall:  no tenderness and no mass.   Lungs:  normal respiratory effort, no dullness, and R wheezes.   Heart:  normal rate, regular rhythm, and Grade   1/6 systolic ejection murmur.   Abdomen:  distended and epigastric tenderness.   Pulses:  R radial normal and L radial normal.   Extremities:  trace left pedal edema and trace right pedal edema.   Neurologic:  alert & oriented X3 and finger-to-nose normal.   Skin:  color normal and no rashes.   Cervical Nodes:  No lymphadenopathy noted Axillary Nodes:  No palpable lymphadenopathy Psych:  Oriented X3 and not anxious appearing.     Impression & Recommendations:  Problem # 1:  KNEE JOINT REPLACEMENT BY OTHER MEANS (ICD-V43.65) triple bone scan for possible loose protesis Orders: Radiology Referral (Radiology)  Problem # 2:  Preventive Health Care (ICD-V70.0) wellness exam The pt was asked about all immunizations, health maint. services that are appropriate to their age and was given guidance on diet exercize  and weight management  Mammogram: normal (02/01/2008) Chol: 176 (02/13/2010)   HDL: 31.90 (02/13/2010)   LDL: 58 (06/04/2008)   TG: 282.0 (02/13/2010) TSH: 1.61 (02/13/2010)   HgbA1C: 5.9 (09/08/2007)   Next mammogram due:: 11/2008 (02/21/2010)  Discussed using sunscreen, use of alcohol, drug use, self breast exam, routine dental care, routine eye care, schedule for GYN exam, routine physical exam, seat belts, multiple vitamins, osteoporosis prevention, adequate calcium intake in diet, recommendations for immunizations, mammograms and Pap smears.  Discussed exercise and checking cholesterol.  Discussed gun safety, safe sex, and  contraception.  Complete Medication List: 1)  Ergocalciferol 50000 Unit Caps (Ergocalciferol) .... One by mouth weekly 2)  Spiriva Handihaler 18 Mcg Caps (Tiotropium bromide monohydrate) .Marland Kitchen.. 1 puff once daily 3)  Prilosec 40 Mg Cpdr (Omeprazole) .Marland Kitchen.. 1 once daily as needed 4)  Cartia Xt 180 Mg Xr24h-cap (Diltiazem hcl coated beads) .... One by mouth  daily 5)  Warfarin Sodium 5 Mg Tabs (Warfarin sodium) .... 1.5 once daily 6)  Alprazolam 0.5 Mg Tabs (Alprazolam) .... One by mouth three times a day as needed 7)  Bisoprolol Fumarate 5 Mg Tabs (Bisoprolol fumarate) .... One by mouth daily 8)  Clobetasol Propionate 0.05 % Foam (Clobetasol propionate) .... Apply to scalp every other day  Patient Instructions: 1)  Please schedule a follow-up appointment in 3 months.   Preventive Care Screening  Mammogram:    Date:  02/01/2008    Next Due:  11/2008    Results:  normal

## 2010-09-02 NOTE — Assessment & Plan Note (Signed)
Summary: Cardiology Nuclear Study  Nuclear Med Background Indications for Stress Test: Evaluation for Ischemia, Post Hospital  Indications Comments: 12/23/09 ZOX:WRUEA pain, (-) enzymes  History: COPD, Echo, GXT  History Comments:  ~15 yrs. ago GXT:OK per patient; 3/10 Echo:EF=60%; h/o afib.  Symptoms: Chest Pain, DOE, Palpitations  Symptoms Comments: Last episode of VW:UJWJ since d/c.   Nuclear Pre-Procedure Cardiac Risk Factors: Carotid Disease, CVA, History of Smoking, Hypertension, Lipids, Obesity, PVD, TIA Caffeine/Decaff Intake: None NPO After: 8:00 PM Lungs: Clear.  O2 Sat 97% on RA. IV 0.9% NS with Angio Cath: 20g     IV Site: (R) AC IV Started by: Irean Hong RN Chest Size (in) 38     Cup Size C     Height (in): 64 Weight (lb): 194 BMI: 33.42 Tech Comments: Bisoprolol last taken at 2 pm yesterday.  Nuclear Med Study 1 or 2 day study:  1 day     Stress Test Type:  Eugenie Birks Reading MD:  Willa Rough, MD     Referring MD:  Darryll Capers, MD Resting Radionuclide:  Technetium 41m Tetrofosmin     Resting Radionuclide Dose:  10.2 mCi  Stress Radionuclide:  Technetium 61m Tetrofosmin     Stress Radionuclide Dose:  32.8 mCi   Stress Protocol   Lexiscan: 0.4 mg   Stress Test Technologist:  Rea College CMA-N     Nuclear Technologist:  Domenic Polite CNMT  Rest Procedure  Myocardial perfusion imaging was performed at rest 45 minutes following the intravenous administration of Myoview Technetium 24m Tetrofosmin.  Stress Procedure  The patient received IV Lexiscan 0.4 mg over 15-seconds.  Myoview injected at 30-seconds.  There were no significant changes with lexiscan, rare PVC and PAC.  Quantitative spect images were obtained after a 45 minute delay.  QPS Raw Data Images:  Patient motion noted; appropriate software correction applied. Stress Images:  Anterior breast attenuation Rest Images:  Same as stress Subtraction (SDS):  No evidence of ischemia. Transient  Ischemic Dilatation:  1.08  (Normal <1.22)  Lung/Heart Ratio:  .19  (Normal <0.45)  Quantitative Gated Spect Images QGS EDV:  79 ml QGS ESV:  19 ml QGS EF:  77 % QGS cine images:  Normal motion  Findings Normal nuclear study      Overall Impression  Exercise Capacity: Lexiscan BP Response: Normal blood pressure response. Clinical Symptoms: Back and neck pain ECG Impression: No significant ST segment change suggestive of ischemia. Overall Impression: Normal stress nuclear study.  There is anterior breast attenuation.  Appended Document: Cardiology Nuclear Study PT NOTIFIED

## 2010-09-02 NOTE — Assessment & Plan Note (Signed)
Summary: pt//ccm   Nurse Visit   Allergies: 1)  ! Darvon Laboratory Results   Blood Tests   Date/Time Received: November 08, 2009 2:40 PM  Date/Time Reported: November 08, 2009 2:40 PM   PT: 21.4 s   (Normal Range: 10.6-13.4)  INR: 3.1   (Normal Range: 0.88-1.12   Therap INR: 2.0-3.5) Comments: Wynona Canes, CMA  November 08, 2009 2:40 PM     Orders Added: 1)  Est. Patient Level I [99211] 2)  Protime [16109UE]  Laboratory Results   Blood Tests     PT: 21.4 s   (Normal Range: 10.6-13.4)  INR: 3.1   (Normal Range: 0.88-1.12   Therap INR: 2.0-3.5) Comments: Wynona Canes, CMA  November 08, 2009 2:40 PM       ANTICOAGULATION RECORD PREVIOUS REGIMEN & LAB RESULTS Anticoagulation Diagnosis:  Atrial fibrillation on  04/19/2009 Previous INR Goal Range:  2-3 on  04/19/2009 Previous INR:  1.5 on  10/18/2009 Previous Coumadin Dose(mg):  10mg ,7.5mg ,7.5mg ,7.5mg  alt. on  10/18/2009 Previous Regimen:  same on  09/20/2009 Previous Coagulation Comments:  Take 10mg  for only 2 day then back to same dose on  10/18/2009  NEW REGIMEN & LAB RESULTS Current INR: 3.1 Regimen: Hold 1 day then 7.5mg  qd       Repeat testing in: 2 weeks MEDICATIONS VITAMIN D 45409 UNIT  CAPS (ERGOCALCIFEROL) one by mouth weekly with food hold SPIRIVA HANDIHALER 18 MCG CAPS (TIOTROPIUM BROMIDE MONOHYDRATE) 1 puff once daily PRILOSEC 40 MG CPDR (OMEPRAZOLE) 1 once daily as needed CARTIA XT 180 MG XR24H-CAP (DILTIAZEM HCL COATED BEADS) one by mouth  daily WARFARIN SODIUM 5 MG TABS (WARFARIN SODIUM) 1.5 once daily ALPRAZOLAM 0.5 MG TABS (ALPRAZOLAM) one by mouth TID BISOPROLOL FUMARATE 5 MG TABS (BISOPROLOL FUMARATE) one by mouth daily CLOBETASOL PROPIONATE 0.05 % FOAM (CLOBETASOL PROPIONATE) apply to scalp every other day BENEFIBER DRINK MIX  POWD (WHEAT DEXTRIN) use it daily   Anticoagulation Visit Questionnaire      Coumadin dose missed/changed:  No      Abnormal Bleeding Symptoms:  No   Any diet  changes including alcohol intake, vegetables or greens since the last visit:  No Any illnesses or hospitalizations since the last visit:  No Any signs of clotting since the last visit (including chest discomfort, dizziness, shortness of breath, arm tingling, slurred speech, swelling or redness in leg):  No

## 2010-09-02 NOTE — Progress Notes (Signed)
Summary: med refill  Phone Note Refill Request Message from:  Patient  Refills Requested: Medication #1:  BISOPROLOL FUMARATE 5 MG TABS one by mouth daily pt has been out of med for 2 day now. [facility] [address]   Initial call taken by: Heron Sabins,  June 12, 2010 2:38 PM Caller: Patient

## 2010-09-02 NOTE — Assessment & Plan Note (Signed)
Summary: feet swelling/bmw   Vital Signs:  Patient profile:   77 year old female Height:      64 inches Weight:      194 pounds BMI:     33.42 Temp:     98.2 degrees F oral Pulse rate:   76 / minute Resp:     16 per minute BP sitting:   140 / 80  (left arm)  Vitals Entered By: Willy Eddy, LPN (January 08, 1609 11:58 AM) CC: c/o ankles swelling as day goes on ,not in am --about 1 week to 10 days   CC:  c/o ankles swelling as day goes on  and not in am --about 1 week to 10 days.  History of Present Illness: increased possitional sweeling in legs not anemia af in control on exam moderate swelling and some bruising from coumadin  Preventive Screening-Counseling & Management  Alcohol-Tobacco     Smoking Status: quit     Packs/Day: 2.0     Year Started: 1944     Year Quit: 1986     Passive Smoke Exposure: yes  Problems Prior to Update: 1)  Chest Pain  (ICD-786.50) 2)  Encounter For Therapeutic Drug Monitoring  (ICD-V58.83) 3)  Diarrhea  (ICD-787.91) 4)  Neck and Back Pain  (ICD-723.1) 5)  Varicose Veins Lower Extremities W/inflammation  (ICD-454.1) 6)  Edema  (ICD-782.3) 7)  Other Acute Reactions To Stress  (ICD-308.3) 8)  Coumadin Therapy  (ICD-V58.61) 9)  Other Retroperitoneal Abscess  (ICD-567.38) 10)  Other Dysphagia  (ICD-787.29) 11)  Weakness  (ICD-780.79) 12)  Anemia-nos  (ICD-285.9) 13)  Depression  (ICD-311) 14)  Anxiety  (ICD-300.00) 15)  Peripheral Vascular Disease  (ICD-443.9) 16)  Atrial Fibrillation  (ICD-427.31) 17)  Hypoxemia  (ICD-799.02) 18)  Diverticulitis of Colon With Hemorrhage  (ICD-562.13) 19)  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  (ICD-569.81) 20)  Dyspnea  (ICD-786.05) 21)  Weight Loss, Abnormal  (ICD-783.21) 22)  Peptic Ulcer Disease  (ICD-533.90) 23)  Diverticulosis, Colon  (ICD-562.10) 24)  Constipation, Chronic  (ICD-564.09) 25)  Loc Osteoarthros Not Spec Whether Prim/sec Hand  (ICD-715.34) 26)  COPD  (ICD-496) 27)   Hair Loss  (ICD-704.00) 28)  Night Sweats  (ICD-780.8) 29)  Osteoarthrosis, Local Nos, Lower Leg  (ICD-715.36) 30)  Osteoarthritis  (ICD-715.90) 31)  Gerd  (ICD-530.81) 32)  Transient Ischemic Attack, Hx of  (ICD-V12.50) 33)  Cerebrovascular Accident, Hx of  (ICD-V12.50) 34)  Hypertension  (ICD-401.9) 35)  Hyperlipidemia  (ICD-272.4)  Current Problems (verified): 1)  Chest Pain  (ICD-786.50) 2)  Encounter For Therapeutic Drug Monitoring  (ICD-V58.83) 3)  Diarrhea  (ICD-787.91) 4)  Neck and Back Pain  (ICD-723.1) 5)  Varicose Veins Lower Extremities W/inflammation  (ICD-454.1) 6)  Edema  (ICD-782.3) 7)  Other Acute Reactions To Stress  (ICD-308.3) 8)  Coumadin Therapy  (ICD-V58.61) 9)  Other Retroperitoneal Abscess  (ICD-567.38) 10)  Other Dysphagia  (ICD-787.29) 11)  Weakness  (ICD-780.79) 12)  Anemia-nos  (ICD-285.9) 13)  Depression  (ICD-311) 14)  Anxiety  (ICD-300.00) 15)  Peripheral Vascular Disease  (ICD-443.9) 16)  Atrial Fibrillation  (ICD-427.31) 17)  Hypoxemia  (ICD-799.02) 18)  Diverticulitis of Colon With Hemorrhage  (ICD-562.13) 19)  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  (ICD-569.81) 20)  Dyspnea  (ICD-786.05) 21)  Weight Loss, Abnormal  (ICD-783.21) 22)  Peptic Ulcer Disease  (ICD-533.90) 23)  Diverticulosis, Colon  (ICD-562.10) 24)  Constipation, Chronic  (ICD-564.09) 25)  Loc Osteoarthros Not Spec Whether  Prim/sec Hand  (ICD-715.34) 26)  COPD  (ICD-496) 27)  Hair Loss  (ICD-704.00) 28)  Night Sweats  (ICD-780.8) 29)  Osteoarthrosis, Local Nos, Lower Leg  (ICD-715.36) 30)  Osteoarthritis  (ICD-715.90) 31)  Gerd  (ICD-530.81) 32)  Transient Ischemic Attack, Hx of  (ICD-V12.50) 33)  Cerebrovascular Accident, Hx of  (ICD-V12.50) 34)  Hypertension  (ICD-401.9) 35)  Hyperlipidemia  (ICD-272.4)  Medications Prior to Update: 1)  Vitamin D 16109 Unit  Caps (Ergocalciferol) .... One By Mouth Weekly With Food Hold 2)  Spiriva Handihaler 18 Mcg Caps  (Tiotropium Bromide Monohydrate) .Marland Kitchen.. 1 Puff Once Daily 3)  Prilosec 40 Mg Cpdr (Omeprazole) .Marland Kitchen.. 1 Once Daily As Needed 4)  Cartia Xt 180 Mg Xr24h-Cap (Diltiazem Hcl Coated Beads) .... One By Mouth  Daily 5)  Warfarin Sodium 5 Mg Tabs (Warfarin Sodium) .... 1.5 Once Daily 6)  Alprazolam 0.5 Mg Tabs (Alprazolam) .... One By Mouth Tid 7)  Bisoprolol Fumarate 5 Mg Tabs (Bisoprolol Fumarate) .... One By Mouth Daily 8)  Clobetasol Propionate 0.05 % Foam (Clobetasol Propionate) .... Apply To Scalp Every Other Day 9)  Benefiber Drink Mix  Powd (Wheat Dextrin) .... Use It Daily  Current Medications (verified): 1)  Ergocalciferol 50000 Unit Caps (Ergocalciferol) .... One By Mouth Weekly 2)  Spiriva Handihaler 18 Mcg Caps (Tiotropium Bromide Monohydrate) .Marland Kitchen.. 1 Puff Once Daily 3)  Prilosec 40 Mg Cpdr (Omeprazole) .Marland Kitchen.. 1 Once Daily As Needed 4)  Cartia Xt 180 Mg Xr24h-Cap (Diltiazem Hcl Coated Beads) .... One By Mouth  Daily 5)  Warfarin Sodium 5 Mg Tabs (Warfarin Sodium) .... 1.5 Once Daily 6)  Alprazolam 0.5 Mg Tabs (Alprazolam) .... One By Mouth Tid 7)  Bisoprolol Fumarate 5 Mg Tabs (Bisoprolol Fumarate) .... One By Mouth Daily 8)  Clobetasol Propionate 0.05 % Foam (Clobetasol Propionate) .... Apply To Scalp Every Other Day 9)  Benefiber Drink Mix  Powd (Wheat Dextrin) .... Use It Daily  Allergies (verified): 1)  ! Darvon 2)  ! Darvocet  Past History:  Family History: Last updated: 03/18/2007 Family History Hypertension Fam hx Stroke  Social History: Last updated: 03/18/2007 Former Smoker Alcohol use-no Drug use-no Retired Married Regular exercise-no  Risk Factors: Exercise: no (03/18/2007)  Risk Factors: Smoking Status: quit (01/08/2010) Packs/Day: 2.0 (01/08/2010) Passive Smoke Exposure: yes (01/08/2010)  Past medical, surgical, family and social histories (including risk factors) reviewed, and no changes noted (except as noted below).  Past Medical History: Reviewed  history from 10/20/2008 and no changes required. 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 Family History Hypertension Fam hx Stroke  Social History: Reviewed history from 03/18/2007 and no changes required. Former Smoker Alcohol use-no Drug use-no Retired Married Regular exercise-no  Review of Systems       The patient complains of fever.  The patient denies anorexia, weight loss, weight gain, vision loss, decreased hearing, hoarseness, chest pain, syncope, dyspnea on exertion, peripheral edema, prolonged cough, headaches, hemoptysis, abdominal pain, melena, hematochezia, severe indigestion/heartburn, hematuria, incontinence, genital sores, muscle weakness, suspicious skin lesions, transient blindness, difficulty walking, depression, unusual  weight change, abnormal bleeding, enlarged lymph nodes, angioedema, and breast masses.    Physical Exam  General:  alert and underweight appearing.,pale.   Head:  normocephalic and atraumatic.  scalp with scaling lesions Eyes:  vision grossly intact, pupils equal, and pupils round.   Ears:  R ear normal and L ear normal.   Nose:  no external deformity and no nasal discharge.   Mouth:  pharynx pink and moist and no erythema.   Neck:  supple and no masses.   Lungs:  R decreased breath sounds and L decreased breath sounds.  ,. no wheeze, rales  noted Extremities:  trace left pedal edema and trace right pedal edema.     Impression & Recommendations:  Problem # 1:  EDEMA (ICD-782.3) due to weight gain and possitional edema salt restriction no anemia  Discussed elevation of the legs, use of compression stockings, sodium restiction, and medication use.   Problem # 2:  VARICOSE VEINS LOWER EXTREMITIES W/INFLAMMATION (ICD-454.1) mild varicosity with inflamation  Problem # 3:  ANEMIA-NOS (ICD-285.9) resolved  Complete Medication List: 1)  Ergocalciferol 50000 Unit Caps (Ergocalciferol) .... One by mouth weekly 2)  Spiriva Handihaler 18 Mcg Caps (Tiotropium bromide monohydrate) .Marland Kitchen.. 1 puff once daily 3)  Prilosec 40 Mg Cpdr (Omeprazole) .Marland Kitchen.. 1 once daily as needed 4)  Cartia Xt 180 Mg Xr24h-cap (Diltiazem hcl coated beads) .... One by mouth  daily 5)  Warfarin Sodium 5 Mg Tabs (Warfarin sodium) .... 1.5 once daily 6)  Alprazolam 0.5 Mg Tabs (Alprazolam) .... One by mouth tid 7)  Bisoprolol Fumarate 5 Mg Tabs (Bisoprolol fumarate) .... One by mouth daily 8)  Clobetasol Propionate 0.05 % Foam (Clobetasol propionate) .... Apply to scalp every other day 9)  Benefiber Drink Mix Powd (Wheat dextrin) .... Use it daily  Other Orders: Protime (16109UE) Venipuncture (45409)  Patient Instructions: 1)  keep appointment is [place] Prescriptions: ERGOCALCIFEROL 50000 UNIT CAPS (ERGOCALCIFEROL) one by mouth weekly  #5 x 3   Entered and Authorized by:   Stacie Glaze MD   Signed by:   Stacie Glaze MD on 01/08/2010   Method used:   Electronically to        [facility]  [phone]* (retail)       [address]       [place], [place]  [address]       Ph: [phone] or [phone]       Fax: [phone]   RxID:   [id number]   Appended Document: Orders Update     Clinical Lists Changes  Observations: Added new observation of HOSPITALIZA: swelling legs (01/08/2010 12:47) Added new  observation of ABNORM BLEED: No (01/08/2010 12:47) Added new observation of COUMADIN CHG: No (01/08/2010 12:47) Added new observation of NEXT PT: 4 weeks (01/08/2010 12:47) Added new observation of CUR. REGIMEN: 7.5mg  qd (01/08/2010 12:47) Added new observation of COMMENTS2: Wynona Canes, CMA  January 08, 2010 12:48 PM  (01/08/2010 12:47) Added new observation of INR: 2.4  (01/08/2010 12:47)      Laboratory Results   Blood Tests   Date/Time Recieved: January 08, 2010 12:48 PM  Date/Time Reported: January 08, 2010 12:48 PM    INR: 2.4   (Normal Range: 0.88-1.12   Therap INR: 2.0-3.5) Comments: Wynona Canes, CMA  January 08, 2010 12:48 PM       ANTICOAGULATION RECORD PREVIOUS REGIMEN & LAB RESULTS Anticoagulation Diagnosis:  Atrial fibrillation on  04/19/2009 Previous INR Goal Range:  2-3 on  04/19/2009 Previous INR:  2.1 on  11/21/2009 Previous Coumadin Dose(mg):  7.5 qd on  11/21/2009 Previous Regimen:  Hold 1 day then 7.5mg  qd on  11/08/2009 Previous Coagulation Comments:  Take 10mg  for only 2 day then back to same dose on  10/18/2009  NEW REGIMEN & LAB RESULTS Current INR: 2.4 Regimen: 7.5mg  qd       Repeat testing in: 4 weeks MEDICATIONS ERGOCALCIFEROL 50000 UNIT CAPS (ERGOCALCIFEROL) one by mouth weekly SPIRIVA HANDIHALER 18 MCG CAPS (TIOTROPIUM BROMIDE MONOHYDRATE) 1 puff once daily PRILOSEC 40 MG CPDR (OMEPRAZOLE) 1 once daily as needed CARTIA XT 180 MG XR24H-CAP (DILTIAZEM HCL COATED BEADS) one by mouth  daily WARFARIN SODIUM 5 MG TABS (WARFARIN SODIUM) 1.5 once daily ALPRAZOLAM 0.5 MG TABS (ALPRAZOLAM) one by mouth TID BISOPROLOL FUMARATE 5 MG TABS (BISOPROLOL FUMARATE) one by mouth daily CLOBETASOL PROPIONATE 0.05 % FOAM (CLOBETASOL PROPIONATE) apply to scalp every other day BENEFIBER DRINK MIX  POWD (WHEAT DEXTRIN) use it daily   Anticoagulation Visit Questionnaire      Coumadin dose missed/changed:  No      Abnormal Bleeding Symptoms:  No   Any diet  changes including alcohol intake, vegetables or greens since the last visit:  No Any illnesses or hospitalizations since the last visit:  Yes      Recent Illness/Hospitalizations:  swelling legs Any signs of clotting since the last visit (including chest discomfort, dizziness, shortness of breath, arm tingling, slurred speech, swelling or redness in leg):  No

## 2010-09-02 NOTE — Assessment & Plan Note (Signed)
Summary: problems with scalp/cjr----PT [facility] // RS   Vital Signs:  Patient profile:   77 year old female Height:      64 inches Weight:      184 pounds BMI:     31.70 Temp:     98.2 degrees F oral Pulse rate:   72 / minute Resp:     14 per minute BP sitting:   134 / 72  (left arm)  Vitals Entered By: Willy Eddy, LPN (August 21, 2009 8:15 AM) CC: c/o itchy scalp,  alternating constipation and diarrhea during they day     CC:  c/o itchy scalp and alternating constipation and diarrhea during they day  .  History of Present Illness: The weight is stable not using the treadmill, but pt will use! the pt samw the ortho for a fall and was xrayed ( no fracture) scalp itcing and hair loss nails in "bad shape" tenderness in the scalp but no localized has night sweats and cramps in legs   Preventive Screening-Counseling & Management  Alcohol-Tobacco     Smoking Status: quit     Packs/Day: 2.0     Year Started: 1944     Year Quit: 1986     Passive Smoke Exposure: yes  Problems Prior to Update: 1)  Neck and Back Pain  (ICD-723.1) 2)  Varicose Veins Lower Extremities W/inflammation  (ICD-454.1) 3)  Edema  (ICD-782.3) 4)  Other Acute Reactions To Stress  (ICD-308.3) 5)  Coumadin Therapy  (ICD-V58.61) 6)  Other Retroperitoneal Abscess  (ICD-567.38) 7)  Other Dysphagia  (ICD-787.29) 8)  Weakness  (ICD-780.79) 9)  Anemia-nos  (ICD-285.9) 10)  Depression  (ICD-311) 11)  Anxiety  (ICD-300.00) 12)  Peripheral Vascular Disease  (ICD-443.9) 13)  Atrial Fibrillation  (ICD-427.31) 14)  Hypoxemia  (ICD-799.02) 15)  Diverticulitis of Colon With Hemorrhage  (ICD-562.13) 16)  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  (ICD-569.81) 17)  Dyspnea  (ICD-786.05) 18)  Weight Loss, Abnormal  (ICD-783.21) 19)  Peptic Ulcer Disease  (ICD-533.90) 20)  Diverticulosis, Colon  (ICD-562.10) 21)  Constipation, Chronic  (ICD-564.09) 22)  Loc Osteoarthros Not Spec Whether Prim/sec Hand   (ICD-715.34) 23)  COPD  (ICD-496) 24)  Hair Loss  (ICD-704.00) 25)  Night Sweats  (ICD-780.8) 26)  Osteoarthrosis, Local Nos, Lower Leg  (ICD-715.36) 27)  Osteoarthritis  (ICD-715.90) 28)  Gerd  (ICD-530.81) 29)  Transient Ischemic Attack, Hx of  (ICD-V12.50) 30)  Cerebrovascular Accident, Hx of  (ICD-V12.50) 31)  Hypertension  (ICD-401.9) 32)  Hyperlipidemia  (ICD-272.4)  Medications Prior to Update: 1)  Vitamin D 16109 Unit  Caps (Ergocalciferol) .... One By Mouth Weekly With Food Hold 2)  Spiriva Handihaler 18 Mcg Caps (Tiotropium Bromide Monohydrate) .Marland Kitchen.. 1 Puff Once Daily 3)  Prilosec 40 Mg Cpdr (Omeprazole) .Marland Kitchen.. 1 Once Daily As Needed 4)  Cartia Xt 180 Mg Xr24h-Cap (Diltiazem Hcl Coated Beads) .... One By Mouth  Daily 5)  Warfarin Sodium 5 Mg Tabs (Warfarin Sodium) .... 1.5 Once Daily 6)  Alprazolam 0.5 Mg Tabs (Alprazolam) .... One By Mouth Tid 7)  Metoprolol Succinate 25 Mg Xr24h-Tab (Metoprolol Succinate) .... One By Mouth Daily 8)  Bisoprolol Fumarate 5 Mg Tabs (Bisoprolol Fumarate) .... One By Mouth Daily  Current Medications (verified): 1)  Vitamin D 60454 Unit  Caps (Ergocalciferol) .... One By Mouth Weekly With Food Hold 2)  Spiriva Handihaler 18 Mcg Caps (Tiotropium Bromide Monohydrate) .Marland Kitchen.. 1 Puff Once Daily 3)  Prilosec 40 Mg Cpdr (Omeprazole) .Marland KitchenMarland KitchenMarland Kitchen  1 Once Daily As Needed 4)  Cartia Xt 180 Mg Xr24h-Cap (Diltiazem Hcl Coated Beads) .... One By Mouth  Daily 5)  Warfarin Sodium 5 Mg Tabs (Warfarin Sodium) .... 1.5 Once Daily 6)  Alprazolam 0.5 Mg Tabs (Alprazolam) .... One By Mouth Tid 7)  Bisoprolol Fumarate 5 Mg Tabs (Bisoprolol Fumarate) .... One By Mouth Daily 8)  Clobetasol Propionate 0.05 % Foam (Clobetasol Propionate) .... Apply To Scalp Every Other Day  Allergies (verified): 1)  ! Darvon  Past History:  Family History: Last updated: 03/18/2007 Family History Hypertension Fam hx Stroke  Social History: Last updated: 03/18/2007 Former Smoker Alcohol  use-no Drug use-no Retired Married Regular exercise-no  Risk Factors: Exercise: no (03/18/2007)  Risk Factors: Smoking Status: quit (08/21/2009) Packs/Day: 2.0 (08/21/2009) Passive Smoke Exposure: yes (08/21/2009)  Past medical, surgical, family and social histories (including risk factors) reviewed, and no changes noted (except as noted below).  Past Medical History: Reviewed history from 10/20/2008 and no changes required. 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 Family History Hypertension Fam hx Stroke  Social History: Reviewed history from 03/18/2007 and no changes required. Former Smoker Alcohol use-no Drug use-no Retired Married Regular exercise-no  Review of Systems  The patient denies anorexia, fever, weight loss, weight gain, vision loss, decreased hearing, hoarseness, chest pain, syncope, dyspnea on exertion, peripheral edema, prolonged cough, headaches, hemoptysis, abdominal pain, melena, hematuria, incontinence, genital sores, muscle weakness, suspicious skin lesions, transient blindness, difficulty walking, depression, unusual weight change, abnormal bleeding, enlarged lymph nodes, angioedema, and breast masses.    Physical Exam  General:  alert and underweight appearing.,pale.   Head:  normocephalic and atraumatic.  scalp with scaling lesions Eyes:  vision grossly  intact, pupils equal, and pupils round.   Ears:  R ear normal and L ear normal.   Mouth:  pharynx pink and moist and no erythema.   Neck:  supple and no masses.   Lungs:  R decreased breath sounds and L decreased breath sounds.  ,. no wheeze, rales noted Heart:  normal rate, regular rhythm, and Grade   1/6 systolic ejection murmur.   Abdomen:  distended and epigastric tenderness.     Impression & Recommendations:  Problem # 1:  NIGHT SWEATS (ICD-780.8)  Orders: Venipuncture (16109) TLB-CBC Platelet - w/Differential (85025-CBCD) TLB-BMP (Basic Metabolic Panel-BMET) (80048-METABOL)  Problem # 2:  NECK AND BACK PAIN (ICD-723.1) hx of DJD  Problem # 3:  CONSTIPATION, CHRONIC (ICD-564.09)  hx of abcess and recurrent night sweats with consitipation ( perceptive) has daily stools but has the sensation of needing to go check cbc and low threshhold for CT  Discussed dietary fiber measures and increased water intake.   Problem # 4:  OTHER RETROPERITONEAL ABSCESS (ICD-567.38) hx of will check CBC and CT  Problem # 5:  TRANSIENT ISCHEMIC ATTACK, HX OF (ICD-V12.50) had one episode of word scrambling  Problem # 6:  ANEMIA-NOS (ICD-285.9) moniter Hgb: 11.3 (01/15/2009)   Hct: 32.9 (01/15/2009)   Platelets: 247.0 (01/15/2009) RBC: 3.82 (01/15/2009)   RDW: 14.7 (01/15/2009)   WBC:  5.1 (01/15/2009) MCV: 86.1 (01/15/2009)   MCHC: 34.3 (01/15/2009) TSH: 0.65 (01/15/2009)  Problem # 7:  HAIR LOSS (ICD-704.00) Assessment: Deteriorated trial of clobetasome foam Orders: TLB-TSH (Thyroid Stimulating Hormone) (84443-TSH) TLB-Sedimentation Rate (ESR) (85652-ESR) TLB-T4 (Thyrox), Free (84439-FT4R) TLB-T3, Free (Triiodothyronine) (84481-T3FREE)  Complete Medication List: 1)  Vitamin D 04540 Unit Caps (Ergocalciferol) .... One by mouth weekly with food hold 2)  Spiriva Handihaler 18 Mcg Caps (Tiotropium bromide monohydrate) .Marland Kitchen.. 1 puff once daily 3)  Prilosec 40 Mg Cpdr (Omeprazole) .Marland Kitchen.. 1  once daily as needed 4)  Cartia Xt 180 Mg Xr24h-cap (Diltiazem hcl coated beads) .... One by mouth  daily 5)  Warfarin Sodium 5 Mg Tabs (Warfarin sodium) .... 1.5 once daily 6)  Alprazolam 0.5 Mg Tabs (Alprazolam) .... One by mouth tid 7)  Bisoprolol Fumarate 5 Mg Tabs (Bisoprolol fumarate) .... One by mouth daily 8)  Clobetasol Propionate 0.05 % Foam (Clobetasol propionate) .... Apply to scalp every other day  Patient Instructions: 1)  Please schedule a follow-up appointment in 3 weeks. Prescriptions: CLOBETASOL PROPIONATE 0.05 % FOAM (CLOBETASOL PROPIONATE) apply to scalp every other day  #1 unit x 6   Entered and Authorized by:   Stacie Glaze MD   Signed by:   Stacie Glaze MD on 08/21/2009   Method used:   Electronically to        [facility]  [phone]* (retail)       [address]       [place], [place]  [address]       Ph: [phone] or [phone]       Fax: [phone]   RxID:   [id number]   Appended Document: problems with scalp/cjr----PT [facility] // RS   ANTICOAGULATION RECORD PREVIOUS REGIMEN & LAB RESULTS Anticoagulation Diagnosis:  Atrial fibrillation on  04/19/2009 Previous INR Goal Range:  2-3 on  04/19/2009 Previous INR:  2.6 on  07/02/2009  Previous Regimen:  same on  07/02/2009  NEW REGIMEN & LAB RESULTS Current INR: 1.9 Regimen: same  Repeat testing in: 1 month  Anticoagulation Visit Questionnaire Coumadin dose missed/changed:  No Abnormal Bleeding Symptoms:  No  Any diet changes including alcohol intake, vegetables or greens since the last visit:  No Any illnesses or hospitalizations since the last visit:  No Any signs of clotting since the last visit (including chest discomfort, dizziness, shortness of breath, arm tingling, slurred speech, swelling or redness in leg):  No  MEDICATIONS VITAMIN D 50000 UNIT  CAPS (ERGOCALCIFEROL) one by mouth weekly with food hold SPIRIVA HANDIHALER 18 MCG CAPS (TIOTROPIUM  BROMIDE MONOHYDRATE) 1 puff once daily PRILOSEC 40 MG CPDR (OMEPRAZOLE) 1 once daily as needed CARTIA XT 180 MG XR24H-CAP (DILTIAZEM HCL COATED BEADS) one by mouth  daily WARFARIN SODIUM 5 MG TABS (WARFARIN SODIUM) 1.5 once daily ALPRAZOLAM 0.5 MG TABS (ALPRAZOLAM) one by mouth TID BISOPROLOL FUMARATE 5 MG TABS (BISOPROLOL FUMARATE) one by mouth daily CLOBETASOL PROPIONATE 0.05 % FOAM (CLOBETASOL PROPIONATE) apply to scalp every other day    Laboratory Results   Blood Tests     PT: 16.8 s   (Normal Range: 10.6-13.4)  INR: 1.9   (Normal Range: 0.88-1.12   Therap INR: 2.0-3.5) Comments: Rita Ohara  August 21, 2009 9:55 AM

## 2010-09-02 NOTE — Progress Notes (Signed)
Summary: Call-A-Nurse Report  Phone Note Call from Patient   Summary of Call: pt is in [place] and states she has been drinking cranberry juice and symptoms are mich improved- will return h om in 2 days and states she will call if signs returns Initial call taken by: Willy Eddy, LPN,  Dec 02, 1608 10:30 AM      Call-A-Nurse Triage Call Report Triage Record Num: [id number] Operator: Sudie Grumbling Patient Name: [name] Call Date & Time: 11/30/2009 8:40:21AM Patient Phone: [phone] PCP: Darryll Capers Patient Gender: Female PCP Fax : [phone] Patient DOB: 09-06-1927 [facility] Name[facility] Reason for Call: calling as she is in [place]. 4/29 onset of frequency and burning w urination. Afebrile. Care advice given, rec to proceed to UC for eval of sx as [place] is [place] of state. Not sure if she wants to use UC for eval, strongly recommend eval of sx and not wait until she return to [facility] 5/4. Protocol(s) Used: Urinary Symptoms - Female Recommended Outcome per Protocol: See Provider within 24 hours Reason for Outcome: Urinary tract symptoms Care Advice:  ~ Call provider if urine becomes pink, red, or smoky in color. Increase intake of fluids. Try to drink 8 oz. (.2 liter) every hour when awake, including unsweetened cranberry juice, unless on restricted fluids for other medical reasons. Take sips of fluid or eat ice chips if nauseated or vomiting.  ~ Limit carbonated, alcoholic, and caffeinated beverages such as coffee, tea and soda. Avoid OTC cold and allergy medications that contain caffeine. Limit intake of tomatoes, fruit juices (except for unsweetened cranberry juice), dairy products, spicy foods, sugar, and artificial sweeteners (aspartame or saccharine). Stop or decrease smoking. Reducing exposure to bladder irritants may help lessen urgency.  ~ Analgesic Advice: Consider aspirin, ibuprofen, naproxen or ketoprofen for pain or fever as directed on label  or by pharmacist/provider. PRECAUTION: - If over 12 years of age, should not take longer than 1 week without consulting provider. EXCEPTIONS: - Should not be used if taking blood thinners. - Or if have history of sensitivity/allergy to any of these medications; or history of ulcer or kidney disease.  ~  ~ Tell your provider if you are taking Warfarin (Coumadin) and drinking cranberry juice or taking cranberry capsules.  ~ SYMPTOM / CONDITION MANAGEMENT 11/30/2009 8:52:18AM Page 1 of 1 [email or web address]

## 2010-09-02 NOTE — Assessment & Plan Note (Signed)
Summary: pt/cjr---PT MAY BE RUNNING LATE DUE TO ANOTHER APPT // RS   Nurse Visit   Allergies: 1)  ! Darvon Laboratory Results   Blood Tests   Date/Time Received: November 21, 2009 1:16 PM Date/Time Reported: November 21, 2009 1:16 PM  PT: 17.9 s   (Normal Range: 10.6-13.4)  INR: 2.1   (Normal Range: 0.88-1.12   Therap INR: 2.0-3.5) Comments: Wynona Canes, CMA  November 21, 2009 1:16 PM    Orders Added: 1)  Est. Patient Level I [16109] 2)  Protime [[id number]]  Laboratory Results   Blood Tests     PT: 17.9 s   (Normal Range: 10.6-13.4)  INR: 2.1   (Normal Range: 0.88-1.12   Therap INR: 2.0-3.5) Comments: Wynona Canes, CMA  November 21, 2009 1:16 PM      ANTICOAGULATION RECORD PREVIOUS REGIMEN & LAB RESULTS Anticoagulation Diagnosis:  Atrial fibrillation on  04/19/2009 Previous INR Goal Range:  2-3 on  04/19/2009 Previous INR:  3.1 on  11/08/2009 Previous Coumadin Dose(mg):  10mg ,7.5mg ,7.5mg ,7.5mg  alt. on  10/18/2009 Previous Regimen:  Hold 1 day then 7.5mg  qd on  11/08/2009 Previous Coagulation Comments:  Take 10mg  for only 2 day then back to same dose on  10/18/2009  NEW REGIMEN & LAB RESULTS Current INR: 2.1 Current Coumadin Dose(mg): 7.5 qd Regimen: Hold 1 day then 7.5mg  qd  (no change)       Repeat testing in: 4 wks MEDICATIONS VITAMIN D 81191 UNIT  CAPS (ERGOCALCIFEROL) one by mouth weekly with food hold SPIRIVA HANDIHALER 18 MCG CAPS (TIOTROPIUM BROMIDE MONOHYDRATE) 1 puff once daily PRILOSEC 40 MG CPDR (OMEPRAZOLE) 1 once daily as needed CARTIA XT 180 MG XR24H-CAP (DILTIAZEM HCL COATED BEADS) one by mouth  daily WARFARIN SODIUM 5 MG TABS (WARFARIN SODIUM) 1.5 once daily ALPRAZOLAM 0.5 MG TABS (ALPRAZOLAM) one by mouth TID BISOPROLOL FUMARATE 5 MG TABS (BISOPROLOL FUMARATE) one by mouth daily CLOBETASOL PROPIONATE 0.05 % FOAM (CLOBETASOL PROPIONATE) apply to scalp every other day BENEFIBER DRINK MIX  POWD (WHEAT DEXTRIN) use it daily   Anticoagulation  Visit Questionnaire      Coumadin dose missed/changed:  No      Abnormal Bleeding Symptoms:  No   Any diet changes including alcohol intake, vegetables or greens since the last visit:  No Any illnesses or hospitalizations since the last visit:  No Any signs of clotting since the last visit (including chest discomfort, dizziness, shortness of breath, arm tingling, slurred speech, swelling or redness in leg):  No

## 2010-09-02 NOTE — Assessment & Plan Note (Signed)
Summary: CONSULT RE: SWELLING AROUND SURGERY SITE/CJR   Vital Signs:  Patient profile:   77 year old female Height:      64 inches Weight:      198 pounds BMI:     34.11 Temp:     98.2 degrees F oral Pulse rate:   72 / minute Resp:     14 per minute BP sitting:   140 / 80  (left arm)  Vitals Entered By: Willy Eddy, LPN (April 03, 2010 10:22 AM) CC: c/o swelling at reversed  colostomy site- not constipated-no pain Is Patient Diabetic? No   CC:  c/o swelling at reversed  colostomy site- not constipated-no pain.  History of Present Illness: has noted difficulty with bowel movement and was straining somewhat she then noted a "ballooning" at the site of the surgical scar she is concerned due to hx of peritonities Sister is ill with end stage ( 4) CANCER OF COLON.   Preventive Screening-Counseling & Management  Alcohol-Tobacco     Smoking Status: quit     Packs/Day: 2.0     Year Started: 1944     Year Quit: 1986     Passive Smoke Exposure: yes  Problems Prior to Update: 1)  Preventive Health Care  (ICD-V70.0) 2)  Knee Joint Replacement By Other Means  (ICD-V43.65) 3)  Chest Pain  (ICD-786.50) 4)  Encounter For Therapeutic Drug Monitoring  (ICD-V58.83) 5)  Diarrhea  (ICD-787.91) 6)  Neck and Back Pain  (ICD-723.1) 7)  Varicose Veins Lower Extremities W/inflammation  (ICD-454.1) 8)  Edema  (ICD-782.3) 9)  Other Acute Reactions To Stress  (ICD-308.3) 10)  Coumadin Therapy  (ICD-V58.61) 11)  Other Retroperitoneal Abscess  (ICD-567.38) 12)  Other Dysphagia  (ICD-787.29) 13)  Weakness  (ICD-780.79) 14)  Anemia-nos  (ICD-285.9) 15)  Depression  (ICD-311) 16)  Anxiety  (ICD-300.00) 17)  Peripheral Vascular Disease  (ICD-443.9) 18)  Atrial Fibrillation  (ICD-427.31) 19)  Hypoxemia  (ICD-799.02) 20)  Diverticulitis of Colon With Hemorrhage  (ICD-562.13) 21)  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  (ICD-569.81) 22)  Dyspnea  (ICD-786.05) 23)  Weight Loss,  Abnormal  (ICD-783.21) 24)  Peptic Ulcer Disease  (ICD-533.90) 25)  Diverticulosis, Colon  (ICD-562.10) 26)  Constipation, Chronic  (ICD-564.09) 27)  Loc Osteoarthros Not Spec Whether Prim/sec Hand  (ICD-715.34) 28)  COPD  (ICD-496) 29)  Hair Loss  (ICD-704.00) 30)  Night Sweats  (ICD-780.8) 31)  Osteoarthrosis, Local Nos, Lower Leg  (ICD-715.36) 32)  Osteoarthritis  (ICD-715.90) 33)  Gerd  (ICD-530.81) 34)  Transient Ischemic Attack, Hx of  (ICD-V12.50) 35)  Cerebrovascular Accident, Hx of  (ICD-V12.50) 36)  Hypertension  (ICD-401.9) 37)  Hyperlipidemia  (ICD-272.4)  Current Problems (verified): 1)  Preventive Health Care  (ICD-V70.0) 2)  Knee Joint Replacement By Other Means  (ICD-V43.65) 3)  Chest Pain  (ICD-786.50) 4)  Encounter For Therapeutic Drug Monitoring  (ICD-V58.83) 5)  Diarrhea  (ICD-787.91) 6)  Neck and Back Pain  (ICD-723.1) 7)  Varicose Veins Lower Extremities W/inflammation  (ICD-454.1) 8)  Edema  (ICD-782.3) 9)  Other Acute Reactions To Stress  (ICD-308.3) 10)  Coumadin Therapy  (ICD-V58.61) 11)  Other Retroperitoneal Abscess  (ICD-567.38) 12)  Other Dysphagia  (ICD-787.29) 13)  Weakness  (ICD-780.79) 14)  Anemia-nos  (ICD-285.9) 15)  Depression  (ICD-311) 16)  Anxiety  (ICD-300.00) 17)  Peripheral Vascular Disease  (ICD-443.9) 18)  Atrial Fibrillation  (ICD-427.31) 19)  Hypoxemia  (ICD-799.02) 20)  Diverticulitis of Colon With Hemorrhage  (  LKG-401.02) 21)  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  (ICD-569.81) 22)  Dyspnea  (ICD-786.05) 23)  Weight Loss, Abnormal  (ICD-783.21) 24)  Peptic Ulcer Disease  (ICD-533.90) 25)  Diverticulosis, Colon  (ICD-562.10) 26)  Constipation, Chronic  (ICD-564.09) 27)  Loc Osteoarthros Not Spec Whether Prim/sec Hand  (ICD-715.34) 28)  COPD  (ICD-496) 29)  Hair Loss  (ICD-704.00) 30)  Night Sweats  (ICD-780.8) 31)  Osteoarthrosis, Local Nos, Lower Leg  (ICD-715.36) 32)  Osteoarthritis  (ICD-715.90) 33)  Gerd   (ICD-530.81) 34)  Transient Ischemic Attack, Hx of  (ICD-V12.50) 35)  Cerebrovascular Accident, Hx of  (ICD-V12.50) 36)  Hypertension  (ICD-401.9) 37)  Hyperlipidemia  (ICD-272.4)  Medications Prior to Update: 1)  Ergocalciferol 50000 Unit Caps (Ergocalciferol) .... One By Mouth Weekly 2)  Spiriva Handihaler 18 Mcg Caps (Tiotropium Bromide Monohydrate) .Marland Kitchen.. 1 Puff Once Daily 3)  Prilosec 40 Mg Cpdr (Omeprazole) .Marland Kitchen.. 1 Once Daily As Needed 4)  Cartia Xt 180 Mg Xr24h-Cap (Diltiazem Hcl Coated Beads) .... One By Mouth  Daily 5)  Warfarin Sodium 5 Mg Tabs (Warfarin Sodium) .... 1.5 Once Daily 6)  Alprazolam 0.5 Mg Tabs (Alprazolam) .... One By Mouth Three Times A Day As Needed 7)  Bisoprolol Fumarate 5 Mg Tabs (Bisoprolol Fumarate) .... One By Mouth Daily 8)  Clobetasol Propionate 0.05 % Foam (Clobetasol Propionate) .... Apply To Scalp Every Other Day  Current Medications (verified): 1)  Ergocalciferol 50000 Unit Caps (Ergocalciferol) .... One By Mouth Weekly 2)  Spiriva Handihaler 18 Mcg Caps (Tiotropium Bromide Monohydrate) .Marland Kitchen.. 1 Puff Once Daily 3)  Prilosec 40 Mg Cpdr (Omeprazole) .Marland Kitchen.. 1 Once Daily As Needed 4)  Cartia Xt 180 Mg Xr24h-Cap (Diltiazem Hcl Coated Beads) .... One By Mouth  Daily 5)  Warfarin Sodium 5 Mg Tabs (Warfarin Sodium) .... 1.5 Once Daily 6)  Alprazolam 0.5 Mg Tabs (Alprazolam) .... One By Mouth Three Times A Day As Needed 7)  Bisoprolol Fumarate 5 Mg Tabs (Bisoprolol Fumarate) .... One By Mouth Daily 8)  Clobetasol Propionate 0.05 % Foam (Clobetasol Propionate) .... Apply To Scalp Every Other Day  Allergies (verified): 1)  ! Darvon 2)  ! Darvocet  Past History:  Family History: Last updated: 04/03/2010 Family History Hypertension Fam hx Stroke Family History of Colon CA 1st degree relative <60 SISTER WITH STAGE 4  Social History: Last updated: 03/18/2007 Former Smoker Alcohol use-no Drug use-no Retired Married Regular exercise-no  Risk  Factors: Exercise: no (03/18/2007)  Risk Factors: Smoking Status: quit (04/03/2010) Packs/Day: 2.0 (04/03/2010) Passive Smoke Exposure: yes (04/03/2010)  Past medical, surgical, family and social histories (including risk factors) reviewed, and no changes noted (except as noted below).  Past Medical History: Reviewed history from 10/20/2008 and no changes required. 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 Family History Hypertension Fam hx Stroke Family History of Colon CA 1st degree relative <60 SISTER WITH STAGE 4  Social History: Reviewed history from 03/18/2007 and no changes required. Former Smoker Alcohol use-no Drug use-no Retired Married Regular exercise-no  Review of Systems  The patient  denies anorexia, fever, weight loss, weight gain, vision loss, decreased hearing, hoarseness, chest pain, syncope, dyspnea on exertion, peripheral edema, prolonged cough, headaches, hemoptysis, abdominal pain, melena, hematochezia, severe indigestion/heartburn, hematuria, incontinence, genital sores, muscle weakness, suspicious skin lesions, transient blindness, difficulty walking, depression, unusual weight change, abnormal bleeding, enlarged lymph nodes, angioedema, breast masses, and testicular masses.    Physical Exam  General:  alert and overweight-appearing.   Head:  normocephalic and  atraumatic.  scalp with scaling lesionsnormocephalic and atraumatic.   Ears:  R ear normal and L ear normal.   Nose:  no external deformity and no nasal discharge.   Mouth:  pharynx pink and moist and no erythema.   Neck:  supple and no masses.   Lungs:  normal respiratory effort, no dullness, and R wheezes.   Heart:  normal rate, regular rhythm, and Grade   1/6 systolic ejection murmur.   Abdomen:  soft, normal bowel sounds, and incisional hernia.   Pulses:  R radial normal and L radial normal.   Extremities:  trace left pedal edema and trace right pedal edema.   Neurologic:  alert & oriented X3 and finger-to-nose normal.     Impression & Recommendations:  Problem # 1:  ABDOMINAL INCISIONAL HERNIA (ICD-553.21) discussed he cused woth pt and her husband cousilled for 30 min NEW HERNIAL ON THE RIGHT LATERLA ABDOMNAL WALL 4 CM  RISK FACTORS PRIOR SURGERY OBESISTY WITH WEIGHT GAIN  AND STRAINING  REFERRAL TO GENERAL SURGERY EDUCATONS OF PREVENTION OF COMPLICATIONS SET UP REFERRAL FOR NEXT WEEK  Orders: Surgical Referral (Surgery)  Problem # 3:  COPD (ICD-496)  Her updated medication list for this problem includes:    Spiriva Handihaler 18 Mcg Caps (Tiotropium bromide monohydrate) .Marland Kitchen... 1 puff once daily  Pulmonary Functions Reviewed: O2 sat: 96 (10/20/2008)     Complete Medication List: 1)  Ergocalciferol 50000 Unit Caps (Ergocalciferol) .... One by mouth weekly 2)  Spiriva Handihaler 18 Mcg Caps (Tiotropium bromide monohydrate) .Marland Kitchen.. 1 puff once daily 3)  Prilosec 40 Mg Cpdr (Omeprazole) .Marland Kitchen.. 1 once daily as needed 4)  Cartia Xt 180 Mg Xr24h-cap (Diltiazem hcl coated beads) .... One by mouth  daily 5)  Warfarin Sodium 5 Mg Tabs (Warfarin sodium) .... 1.5 once daily 6)  Alprazolam 0.5 Mg Tabs (Alprazolam) .... One by mouth three times a day as needed 7)  Bisoprolol Fumarate 5 Mg Tabs (Bisoprolol fumarate) .... One by mouth daily 8)  Clobetasol Propionate 0.05 % Foam  (Clobetasol propionate) .... Apply to scalp every other day

## 2010-09-02 NOTE — Assessment & Plan Note (Signed)
Summary: pt//ccm   Nurse Visit   Allergies: 1)  ! Darvon Laboratory Results   Blood Tests   Date/Time Received: October 18, 2009 2:28 PM  Date/Time Reported: October 18, 2009 2:28 PM   PT: 14.8 s   (Normal Range: 10.6-13.4)  INR: 1.5   (Normal Range: 0.88-1.12   Therap INR: 2.0-3.5) Comments: Wynona Canes, CMA  October 18, 2009 2:29 PM     Orders Added: 1)  Est. Patient Level I [99211] 2)  Protime [[id number]]  Laboratory Results   Blood Tests     PT: 14.8 s   (Normal Range: 10.6-13.4)  INR: 1.5   (Normal Range: 0.88-1.12   Therap INR: 2.0-3.5) Comments: Wynona Canes, CMA  October 18, 2009 2:29 PM       ANTICOAGULATION RECORD PREVIOUS REGIMEN & LAB RESULTS Anticoagulation Diagnosis:  Atrial fibrillation on  04/19/2009 Previous INR Goal Range:  2-3 on  04/19/2009 Previous INR:  2.0 on  09/20/2009  Previous Regimen:  same on  09/20/2009  NEW REGIMEN & LAB RESULTS Current INR: 1.5 Current Coumadin Dose(mg): 10mg ,7.5mg ,7.5mg ,7.5mg  alt. Regimen: same  (no change) Coagulation Comments: Take 10mg  for only 2 day then back to same dose Provider: Dr.K      Repeat testing in: 3 weeks MEDICATIONS VITAMIN D 12458 UNIT  CAPS (ERGOCALCIFEROL) one by mouth weekly with food hold SPIRIVA HANDIHALER 18 MCG CAPS (TIOTROPIUM BROMIDE MONOHYDRATE) 1 puff once daily PRILOSEC 40 MG CPDR (OMEPRAZOLE) 1 once daily as needed CARTIA XT 180 MG XR24H-CAP (DILTIAZEM HCL COATED BEADS) one by mouth  daily WARFARIN SODIUM 5 MG TABS (WARFARIN SODIUM) 1.5 once daily ALPRAZOLAM 0.5 MG TABS (ALPRAZOLAM) one by mouth TID BISOPROLOL FUMARATE 5 MG TABS (BISOPROLOL FUMARATE) one by mouth daily CLOBETASOL PROPIONATE 0.05 % FOAM (CLOBETASOL PROPIONATE) apply to scalp every other day BENEFIBER DRINK MIX  POWD (WHEAT DEXTRIN) use it daily   Anticoagulation Visit Questionnaire      Coumadin dose missed/changed:  No      Abnormal Bleeding Symptoms:  No   Any diet changes including alcohol  intake, vegetables or greens since the last visit:  No Any illnesses or hospitalizations since the last visit:  No Any signs of clotting since the last visit (including chest discomfort, dizziness, shortness of breath, arm tingling, slurred speech, swelling or redness in leg):  No

## 2010-09-02 NOTE — Letter (Signed)
Summary: [facility]   Imported By: Maryln Gottron 06/24/2010 15:35:18  _____________________________________________________________________  External Attachment:    Type:   Image     Comment:   External Document

## 2010-09-02 NOTE — Miscellaneous (Signed)
Summary: Initial Summary for PT/[place]  Initial Summary for PT/[place]   Imported By: Sherian Rein 04/09/2010 08:14:51  _____________________________________________________________________  External Attachment:    Type:   Image     Comment:   External Document

## 2010-09-02 NOTE — Assessment & Plan Note (Signed)
Summary: pt/cjr   Nurse Visit   Allergies: 1)  ! Darvon 2)  ! Darvocet Laboratory Results   Blood Tests   Date/Time Received: March 17, 2010 2:52 PM  Date/Time Reported: March 17, 2010 2:52 PM    INR: 2.7   (Normal Range: 0.88-1.12   Therap INR: 2.0-3.5) Comments: Wynona Canes, CMA  March 17, 2010 2:52 PM     Orders Added: 1)  Est. Patient Level I [99211] 2)  Protime [[id number]]  Laboratory Results   Blood Tests      INR: 2.7   (Normal Range: 0.88-1.12   Therap INR: 2.0-3.5) Comments: Wynona Canes, CMA  March 17, 2010 2:52 PM       ANTICOAGULATION RECORD PREVIOUS REGIMEN & LAB RESULTS Anticoagulation Diagnosis:  Atrial fibrillation on  04/19/2009 Previous INR Goal Range:  2-3 on  04/19/2009 Previous INR:  2.1 on  02/13/2010 Previous Coumadin Dose(mg):  7.5 qd on  11/21/2009 Previous Regimen:  7.5mg  qd on  01/08/2010 Previous Coagulation Comments:  Take 10mg  for only 2 day then back to same dose on  10/18/2009  NEW REGIMEN & LAB RESULTS Current INR: 2.7 Regimen: 7.5mg  qd  (no change)       Repeat testing in: 4 weeks MEDICATIONS ERGOCALCIFEROL 50000 UNIT CAPS (ERGOCALCIFEROL) one by mouth weekly SPIRIVA HANDIHALER 18 MCG CAPS (TIOTROPIUM BROMIDE MONOHYDRATE) 1 puff once daily PRILOSEC 40 MG CPDR (OMEPRAZOLE) 1 once daily as needed CARTIA XT 180 MG XR24H-CAP (DILTIAZEM HCL COATED BEADS) one by mouth  daily WARFARIN SODIUM 5 MG TABS (WARFARIN SODIUM) 1.5 once daily ALPRAZOLAM 0.5 MG TABS (ALPRAZOLAM) one by mouth three times a day as needed BISOPROLOL FUMARATE 5 MG TABS (BISOPROLOL FUMARATE) one by mouth daily CLOBETASOL PROPIONATE 0.05 % FOAM (CLOBETASOL PROPIONATE) apply to scalp every other day   Anticoagulation Visit Questionnaire      Coumadin dose missed/changed:  No      Abnormal Bleeding Symptoms:  No   Any diet changes including alcohol intake, vegetables or greens since the last visit:  No Any illnesses or hospitalizations since  the last visit:  No Any signs of clotting since the last visit (including chest discomfort, dizziness, shortness of breath, arm tingling, slurred speech, swelling or redness in leg):  No

## 2010-09-02 NOTE — Progress Notes (Signed)
Summary: impetigo?  Phone Note Call from Patient   Caller: Patient Call For: Lisa Glaze MD Summary of Call: [name] ([place]) [phone] Pt is sure she has impetigo on chin......Marland Kitchenblisters that have scabbed over and burn and itch.  Using antibiotic ointment, but want oral antibiotic or an appt.  No know injury there before the rash started. Initial call taken by: Lynann Beaver CMA,  September 02, 2009 10:45 AM  Follow-up for Phone Call        per dr Lovell Sheehan- may have altibax apply to area two times a day and smz 800 two times a day for 5 days Follow-up by: Willy Eddy, LPN,  September 02, 2009 12:44 PM  Additional Follow-up for Phone Call Additional follow up Details #1::        Phone Call Completed Additional Follow-up by: Rudy Jew, RN,  September 02, 2009 1:21 PM    New/Updated Medications: * ALTIBAX Apply to area bid SMZ-TMP DS 800-160 MG TABS (SULFAMETHOXAZOLE-TRIMETHOPRIM) two times a day for 5 days Prescriptions: SMZ-TMP DS 800-160 MG TABS (SULFAMETHOXAZOLE-TRIMETHOPRIM) two times a day for 5 days  #10 x 0   Entered by:   Rudy Jew, RN   Authorized by:   Lisa Glaze MD   Signed by:   Rudy Jew, RN on 09/02/2009   Method used:   Telephoned to ...       [facility]  [address]  [phone]* (retail)       [address]       [place], [place]  [address]       Ph: [phone] or [phone]       Fax: [phone]   RxID:   [id number] ALTIBAX Apply to area bid  #1 x 0   Entered by:   Rudy Jew, RN   Authorized by:   Lisa Glaze MD   Signed by:   Rudy Jew, RN on 09/02/2009   Method used:   Telephoned to ...       [facility]  [address]  [phone]* (retail)       [address]       [place], [place]  [address]       Ph: [phone] or [phone]       Fax: [phone]   RxID:   [id number]

## 2010-09-02 NOTE — Consult Note (Signed)
Summary: [facility]   Imported By: Maryln Gottron 04/29/2010 12:53:16  _____________________________________________________________________  External Attachment:    Type:   Image     Comment:   External Document

## 2010-09-02 NOTE — Progress Notes (Signed)
Summary: [name] CALL  Phone Note Call from Patient Call back at Home Phone [phone]   Caller: Patient Call For: Stacie Glaze MD Summary of Call: PT WAS DISCHARGE FROM HOS TODAY. PT IS REQUESTING TO SPEAK TO BONNIE, PT WAS ADVISED TO SEE DR Lovell Sheehan THIS WK Initial call taken by: Heron Sabins,  Dec 23, 2009 9:41 AM  Follow-up for Phone Call        per dr Lovell Sheehan- scheduled adenosine myoview and then ov with  dr Lovell Sheehan Follow-up by: Willy Eddy, LPN,  Dec 23, 2009 12:53 PM  New Problems: CHEST PAIN (ICD-786.50)   New Problems: CHEST PAIN (ICD-786.50)

## 2010-09-03 ENCOUNTER — Ambulatory Visit: Payer: 59 | Admitting: Physical Therapy

## 2010-09-04 NOTE — Progress Notes (Signed)
Summary: Pt needs to sch fup asap with Dr. Lovell Sheehan per Dr Rodena Medin  Phone Note Call from Patient Call back at [facility] Phone [phone]   Caller: Patient Reason for Call: Acute Illness, Refill Medication Summary of Call: Pt called and had an appt with Dr. Rodena Medin 08/07/10, and Dr Rodena Medin added a med that has made pts blood too thin. Pt was advised by Dr Rodena Medin to sch fup with Dr. Lovell Sheehan asap. Pls advise.  Initial call taken by: Lucy Antigua,  August 12, 2010 3:00 PM  Follow-up for Phone Call        talked with pt and she insists dr Lovell Sheehan wants her to be seen asap for 3.5 inr-and dr Lovell Sheehan gave instructions for elevated inr----when ov??? Follow-up by: Willy Eddy, LPN,  August 12, 2010 3:05 PM  Additional Follow-up for Phone Call Additional follow up Details #1::        talked with dr Alesia Richards to wait for appointment for 1-2- weeks- ov made for 1-23 Additional Follow-up by: Willy Eddy, LPN,  August 13, 2010 11:54 AM

## 2010-09-04 NOTE — Assessment & Plan Note (Signed)
Summary: hoarseness/njr   Vital Signs:  Patient profile:   77 year old female Weight:      199 pounds Pulse rate:   80 / minute BP sitting:   110 / 80  (left arm)  Vitals Entered By: Kyung Rudd, CMA (August 15, 2010 12:12 PM) CC: pt c/o laryngiti x 4-5days...notes she is always sore due to damaged vocal cords but feel like this is something else   Primary Care Provider:  Stacie Glaze MD  CC:  pt c/o laryngiti x 4-5days...notes she is always sore due to damaged vocal cords but feel like this is something else.  History of Present Illness: Patient presents to clinic as a workin for evaluation of laryngitis. Notes  ~4-5 d h/o intermittently decreased voice. No pain on swallowing. Denies f/c, cough, nasal congestion. No known sick exposure. No alleviating or exacerbating factors. Believes voice may be improved today. Recently seen for arm pain and placed on medrol dosepak. Noted almost immediate improvement after beginning medrol however had HA with steroid. HA resolved with stopping medication. Is participating in PT. INR mildly supratherapeutic with medrol without gross active bleeding. Dose adjusted by pmd with plans to repeat inr.  Current Medications (verified): 1)  Ergocalciferol 50000 Unit Caps (Ergocalciferol) .... One By Mouth Weekly 2)  Spiriva Handihaler 18 Mcg Caps (Tiotropium Bromide Monohydrate) .Marland Kitchen.. 1 Puff Once Daily 3)  Prilosec 40 Mg Cpdr (Omeprazole) .Marland Kitchen.. 1 Once Daily As Needed 4)  Cartia Xt 180 Mg Xr24h-Cap (Diltiazem Hcl Coated Beads) .... One By Mouth  Daily 5)  Warfarin Sodium 5 Mg Tabs (Warfarin Sodium) .... 1.5 Once Daily 6)  Alprazolam 0.5 Mg Tabs (Alprazolam) .... One By Mouth Three Times A Day As Needed 7)  Bisoprolol Fumarate 5 Mg Tabs (Bisoprolol Fumarate) .... One By Mouth Daily 8)  Vicodin 5-500 Mg Tabs (Hydrocodone-Acetaminophen) .... One By Mouth Q6 Hours As Needed Pain  Allergies (verified): 1)  ! Darvon 2)  ! Darvocet  Past History:  Past  medical, surgical, family and social histories (including risk factors) reviewed, and no changes noted (except as noted below).  Past Medical History: Reviewed history from 10/20/2008 and no changes required. 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 Family History Hypertension Fam hx Stroke Family History of Colon CA 1st degree relative <60 SISTER WITH STAGE 4  Social History: Reviewed history from 03/18/2007 and no changes required. Former Smoker Alcohol use-no Drug use-no Retired Married Regular exercise-no  Review of Systems      See HPI General:  Denies chills, fever, and sweats. Eyes:  Denies discharge, eye irritation, and eye pain. ENT:  Denies decreased hearing, ear discharge, earache, postnasal drainage, and sore throat. Resp:  Denies cough, coughing up blood, shortness of breath, sputum productive, and wheezing. MS:  Complains of joint pain and muscle weakness. Derm:  Denies changes in color of skin, flushing, and rash.  Physical Exam  General:  Well-developed,well-nourished,in no acute distress; alert,appropriate and cooperative throughout examination Head:  Normocephalic and atraumatic without obvious abnormalities. No apparent alopecia or balding. Eyes:  pupils equal, pupils round, pupils react to accomodation, and corneas and lenses clear.   Ears:  External ear exam shows  no significant lesions or deformities.  Otoscopic examination reveals clear canals, tympanic membranes are intact bilaterally without bulging, retraction, inflammation or discharge. Hearing is grossly normal bilaterally. Nose:  External nasal examination shows no deformity or inflammation. Nasal mucosa are pink and moist without lesions or exudates. Mouth:  sl posterior erythema without exudate. mild posterior clear drainage. Skin:  turgor normal, color normal, and no rashes.   Cervical Nodes:  No lymphadenopathy noted   Impression & Recommendations:  Problem # 1:  ACUTE LARYNGITIS, WITHOUT MENTION OF OBSTRUCTIO (ICD-464.00) Assessment New Suspect viral etiology. Recommend conservative otc symptomatic tx. Followup closely if no improvement or worsening.  Problem # 2:  ARM PAIN (ICD-729.5) Assessment: Improved Improved with medrol. Continue PT  Complete Medication List: 1)  Ergocalciferol 50000 Unit Caps (Ergocalciferol) .... One by mouth weekly 2)  Spiriva Handihaler 18 Mcg Caps (Tiotropium bromide monohydrate) .Marland Kitchen.. 1 puff once daily 3)  Prilosec 40 Mg Cpdr (Omeprazole) .Marland Kitchen.. 1 once daily as needed 4)  Cartia Xt 180 Mg Xr24h-cap (Diltiazem hcl coated beads) .... One by mouth  daily 5)  Warfarin Sodium 5 Mg Tabs (Warfarin sodium) .... 1.5 once daily 6)  Alprazolam 0.5 Mg Tabs (Alprazolam) .... One by mouth three times a day as needed 7)  Bisoprolol Fumarate 5 Mg Tabs (Bisoprolol fumarate) .... One by mouth daily 8)  Vicodin 5-500 Mg Tabs (Hydrocodone-acetaminophen) .... One by mouth q6 hours as needed pain   Orders Added: 1)  Est. Patient Level III [04540]

## 2010-09-04 NOTE — Assessment & Plan Note (Signed)
Summary: pt/njr   Nurse Visit   Allergies: 1)  ! Darvon 2)  ! Darvocet Laboratory Results   Blood Tests      INR: 3.5   (Normal Range: 0.88-1.12   Therap INR: 2.0-3.5) Comments: Rita Ohara  August 11, 2010 2:47 PM     Orders Added: 1)  Est. Patient Level I [99211] 2)  Protime [[id number]]   ANTICOAGULATION RECORD PREVIOUS REGIMEN & LAB RESULTS Anticoagulation Diagnosis:  Atrial fibrillation on  04/19/2009 Previous INR Goal Range:  2-3 on  04/19/2009 Previous INR:  2.6 on  07/02/2010 Previous Coumadin Dose(mg):  7.5 qd on  11/21/2009 Previous Regimen:  same on  07/02/2010 Previous Coagulation Comments:  Patient could not give urine sample. on  04/25/2010  NEW REGIMEN & LAB RESULTS Current INR: 3.5 Regimen: hold 2 days then resume  Repeat testing in: 4 weeks  Anticoagulation Visit Questionnaire Coumadin dose missed/changed:  No Abnormal Bleeding Symptoms:  No  Any diet changes including alcohol intake, vegetables or greens since the last visit:  No Any illnesses or hospitalizations since the last visit:  No Any signs of clotting since the last visit (including chest discomfort, dizziness, shortness of breath, arm tingling, slurred speech, swelling or redness in leg):  No  MEDICATIONS ERGOCALCIFEROL 50000 UNIT CAPS (ERGOCALCIFEROL) one by mouth weekly SPIRIVA HANDIHALER 18 MCG CAPS (TIOTROPIUM BROMIDE MONOHYDRATE) 1 puff once daily PRILOSEC 40 MG CPDR (OMEPRAZOLE) 1 once daily as needed CARTIA XT 180 MG XR24H-CAP (DILTIAZEM HCL COATED BEADS) one by mouth  daily WARFARIN SODIUM 5 MG TABS (WARFARIN SODIUM) 1.5 once daily ALPRAZOLAM 0.5 MG TABS (ALPRAZOLAM) one by mouth three times a day as needed BISOPROLOL FUMARATE 5 MG TABS (BISOPROLOL FUMARATE) one by mouth daily VICODIN 5-500 MG TABS (HYDROCODONE-ACETAMINOPHEN) one by mouth q6 hours as needed pain MEDROL (PAK) 4 MG TABS (METHYLPREDNISOLONE) as directed

## 2010-09-04 NOTE — Assessment & Plan Note (Signed)
Summary: shoulder and neck pain//ccm   Vital Signs:  Patient profile:   77 year old female Weight:      198 pounds Pulse rate:   72 / minute BP sitting:   150 / 80  (right arm)  Vitals Entered By: Kyung Rudd, CMA (August 07, 2010 4:17 PM) CC: rt shoulder pain, rt wrist pain, and itchy eyelids. Can barely move rt arm when she wakes up in the mornings   Primary Care Provider:  Stacie Glaze MD  CC:  rt shoulder pain, rt wrist pain, and and itchy eyelids. Can barely move rt arm when she wakes up in the mornings.  History of Present Illness: Patient presents to clinic as a workin for evaluation of arm pain. Notes today experienced right wrist pain and stiffness for several hours now improved spontaneously. Has intermittent right shoulder and trapezium pain worse at night lying on that side. Did have episode of right arm radicular pain with intermittent hand paresthesias. No injury/trauma or focal weakness. States has h/o spinal scoliosis. Took tylenol ES x2 last night with minimal improvement.   Current Medications (verified): 1)  Ergocalciferol 50000 Unit Caps (Ergocalciferol) .... One By Mouth Weekly 2)  Spiriva Handihaler 18 Mcg Caps (Tiotropium Bromide Monohydrate) .Marland Kitchen.. 1 Puff Once Daily 3)  Prilosec 40 Mg Cpdr (Omeprazole) .Marland Kitchen.. 1 Once Daily As Needed 4)  Cartia Xt 180 Mg Xr24h-Cap (Diltiazem Hcl Coated Beads) .... One By Mouth  Daily 5)  Warfarin Sodium 5 Mg Tabs (Warfarin Sodium) .... 1.5 Once Daily 6)  Alprazolam 0.5 Mg Tabs (Alprazolam) .... One By Mouth Three Times A Day As Needed 7)  Bisoprolol Fumarate 5 Mg Tabs (Bisoprolol Fumarate) .... One By Mouth Daily  Allergies (verified): 1)  ! Darvon 2)  ! Darvocet  Past History:  Past medical, surgical, family and social histories (including risk factors) reviewed for relevance to current acute and chronic problems.  Past Medical History: Reviewed history from 10/20/2008 and no changes  required. 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 Family History Hypertension Fam hx Stroke Family History of Colon CA 1st degree relative <60 SISTER WITH STAGE 4  Social History: Reviewed history from 03/18/2007 and no changes required. Former Smoker Alcohol use-no Drug use-no Retired Married Regular exercise-no  Review of Systems      See HPI  Physical Exam  General:  Well-developed,well-nourished,in no acute distress; alert,appropriate and cooperative throughout examination Head:  Normocephalic and atraumatic without obvious abnormalities. No apparent alopecia or balding. Eyes:  vision grossly intact, pupils equal, and pupils round.   Ears:  no external deformities.   Nose:  no external deformity.   Msk:   no joint tenderness, no joint swelling, no joint warmth, no redness over joints, and no crepitation.Decreased abduction with passive and active resistance.   Neurologic:  strength normal in all extremities and gait normal.     Impression & Recommendations:  Problem # 1:  SHOULDER PAIN, RIGHT (ICD-719.41) Assessment New Attempt medrol dosepak and PT. Analgesia as needed. Cautioned regarding possible sedating effect. Followup if  no improvement or worsening.  Her updated medication list for this problem includes:    Vicodin 5-500 Mg Tabs (Hydrocodone-acetaminophen) ..... One by  mouth q6 hours as needed pain  Orders: Physical Therapy Referral (PT)  Complete Medication List: 1)  Ergocalciferol 50000 Unit Caps (Ergocalciferol) .... One by mouth weekly 2)  Spiriva Handihaler 18 Mcg Caps (Tiotropium bromide monohydrate) .Marland Kitchen.. 1 puff once daily 3)  Prilosec 40 Mg Cpdr (Omeprazole) .Marland Kitchen.. 1 once daily as needed 4)  Cartia Xt 180 Mg Xr24h-cap (Diltiazem hcl coated beads) .... One by mouth  daily 5)  Warfarin Sodium 5 Mg Tabs (Warfarin sodium) .... 1.5 once daily 6)  Alprazolam 0.5 Mg Tabs (Alprazolam) .... One by mouth three times a day as needed 7)  Bisoprolol Fumarate 5 Mg Tabs (Bisoprolol fumarate) .... One by mouth daily 8)  Vicodin 5-500 Mg Tabs (Hydrocodone-acetaminophen) .... One by mouth q6 hours as needed pain 9)  Medrol (pak) 4 Mg Tabs (Methylprednisolone) .... As directed Prescriptions: MEDROL (PAK) 4 MG TABS (METHYLPREDNISOLONE) as directed  #1 x 0   Entered and Authorized by:   Edwyna Perfect MD   Signed by:   Edwyna Perfect MD on 08/07/2010   Method used:   Print then Give to Patient   RxID:   [id number] VICODIN 5-500 MG TABS (HYDROCODONE-ACETAMINOPHEN) one by mouth q6 hours as needed pain  #20 x 0   Entered and Authorized by:   Edwyna Perfect MD   Signed by:   Edwyna Perfect MD on 08/07/2010   Method used:   Print then Give to Patient   RxID:   [id number]    Orders Added: 1)  Physical Therapy Referral [PT] 2)  Est. Patient Level IV [84696]

## 2010-09-04 NOTE — Miscellaneous (Signed)
Summary: Initial Summary for PT Services/[facility]  Initial Summary for PT Services/ Rehab   Imported By: Maryln Gottron 08/19/2010 13:44:10  _____________________________________________________________________  External Attachment:    Type:   Image     Comment:   External Document

## 2010-09-04 NOTE — Letter (Signed)
Summary: [facility]   Imported By: Maryln Gottron 07/30/2010 13:55:37  _____________________________________________________________________  External Attachment:    Type:   Image     Comment:   External Document

## 2010-09-17 ENCOUNTER — Other Ambulatory Visit: Payer: 59 | Admitting: Internal Medicine

## 2010-09-17 DIAGNOSIS — I4891 Unspecified atrial fibrillation: Secondary | ICD-10-CM

## 2010-09-17 NOTE — Patient Instructions (Signed)
  Latest dosing instructions   Total Sun Mon Tue Wed Thu Fri Sat   52.5 7.5 mg 7.5 mg 7.5 mg 7.5 mg 7.5 mg 7.5 mg 7.5 mg    (5 mg1.5) (5 mg1.5) (5 mg1.5) (5 mg1.5) (5 mg1.5) (5 mg1.5) (5 mg1.5)

## 2010-09-18 NOTE — Assessment & Plan Note (Signed)
Summary: f/u from dr hodgin/bmw   Vital Signs:  Patient profile:   77 year old female Height:      64 inches Weight:      198 pounds BMI:     34.11 Temp:     98.2 degrees F oral Pulse rate:   80 / minute Resp:     14 per minute BP sitting:   126 / 80  (left arm)  Vitals Entered By: Willy Eddy, LPN (August 25, 2010 4:27 PM) CC: F/U AFTER SEEING DR [facility] FOR SHOULDER PAIN- WAS GIVEN PREDNISONE AND VIDOCAN [facility] HELPED, Lipid Management Is Patient Diabetic? No   Primary Care Provider:  Stacie Glaze MD  CC:  F/U AFTER SEEING DR [facility] FOR SHOULDER PAIN- WAS GIVEN PREDNISONE AND VIDOCAN [facility] HELPED and Lipid Management.  History of Present Illness: The pt has a hx of larygial cancer with chronic hoarseness... was treated with local radiation. The pt's symptoms have improved. The pt also presented with shoulder pain... The pt 's PT suggested hold PT for two weeks then resume if symptoms worsen Wants to be cleared to start exercize. Has sever knee OA pulse ox at ra is 93% walking test in office  sat dropped to 90%  Lipid Management History:      Positive NCEP/ATP III risk factors include female age 11 years old or older, HDL cholesterol less than 40, hypertension, and peripheral vascular disease.  Negative NCEP/ATP III risk factors include no history of early menopause without estrogen hormone replacement, non-diabetic, no family history for ischemic heart disease, non-tobacco-user status, no ASHD (atherosclerotic heart disease), no prior stroke/TIA, and no history of aortic aneurysm.    Preventive Screening-Counseling & Management  Alcohol-Tobacco     Smoking Status: quit     Packs/Day: 2.0     Year Started: 1946     Year Quit: 1986     Passive Smoke Exposure: yes  Problems Prior to Update: 1)  Abdominal Incisional Hernia  (ICD-553.21) 2)  Family History of Colon Ca 1st Degree Relative <60  (ICD-V16.0) 3)  Preventive Health Care  (ICD-V70.0) 4)  Knee Joint  Replacement By Other Means  (ICD-V43.65) 5)  Chest Pain  (ICD-786.50) 6)  Encounter For Therapeutic Drug Monitoring  (ICD-V58.83) 7)  Diarrhea  (ICD-787.91) 8)  Neck and Back Pain  (ICD-723.1) 9)  Varicose Veins Lower Extremities W/inflammation  (ICD-454.1) 10)  Edema  (ICD-782.3) 11)  Coumadin Therapy  (ICD-V58.61) 12)  Other Retroperitoneal Abscess  (ICD-567.38) 13)  Other Dysphagia  (ICD-787.29) 14)  Weakness  (ICD-780.79) 15)  Anemia-nos  (ICD-285.9) 16)  Depression  (ICD-311) 17)  Anxiety  (ICD-300.00) 18)  Peripheral Vascular Disease  (ICD-443.9) 19)  Atrial Fibrillation  (ICD-427.31) 20)  Diverticulitis of Colon With Hemorrhage  (ICD-562.13) 21)  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  (ICD-569.81) 22)  Dyspnea  (ICD-786.05) 23)  Peptic Ulcer Disease  (ICD-533.90) 24)  Diverticulosis, Colon  (ICD-562.10) 25)  Loc Osteoarthros Not Spec Whether Prim/sec Hand  (ICD-715.34) 26)  COPD  (ICD-496) 27)  Hair Loss  (ICD-704.00) 28)  Night Sweats  (ICD-780.8) 29)  Osteoarthrosis, Local Nos, Lower Leg  (ICD-715.36) 30)  Osteoarthritis  (ICD-715.90) 31)  Gerd  (ICD-530.81) 32)  Transient Ischemic Attack, Hx of  (ICD-V12.50) 33)  Cerebrovascular Accident, Hx of  (ICD-V12.50) 34)  Hypertension  (ICD-401.9) 35)  Hyperlipidemia  (ICD-272.4)  Current Problems (verified): 1)  Abdominal Incisional Hernia  (ICD-553.21) 2)  Family History of Colon Ca 1st Degree Relative <  60  (ICD-V16.0) 3)  Preventive Health Care  (ICD-V70.0) 4)  Knee Joint Replacement By Other Means  (ICD-V43.65) 5)  Chest Pain  (ICD-786.50) 6)  Encounter For Therapeutic Drug Monitoring  (ICD-V58.83) 7)  Diarrhea  (ICD-787.91) 8)  Neck and Back Pain  (ICD-723.1) 9)  Varicose Veins Lower Extremities W/inflammation  (ICD-454.1) 10)  Edema  (ICD-782.3) 11)  Coumadin Therapy  (ICD-V58.61) 12)  Other Retroperitoneal Abscess  (ICD-567.38) 13)  Other Dysphagia  (ICD-787.29) 14)  Weakness  (ICD-780.79) 15)   Anemia-nos  (ICD-285.9) 16)  Depression  (ICD-311) 17)  Anxiety  (ICD-300.00) 18)  Peripheral Vascular Disease  (ICD-443.9) 19)  Atrial Fibrillation  (ICD-427.31) 20)  Diverticulitis of Colon With Hemorrhage  (ICD-562.13) 21)  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  (ICD-569.81) 22)  Dyspnea  (ICD-786.05) 23)  Peptic Ulcer Disease  (ICD-533.90) 24)  Diverticulosis, Colon  (ICD-562.10) 25)  Loc Osteoarthros Not Spec Whether Prim/sec Hand  (ICD-715.34) 26)  COPD  (ICD-496) 27)  Hair Loss  (ICD-704.00) 28)  Night Sweats  (ICD-780.8) 29)  Osteoarthrosis, Local Nos, Lower Leg  (ICD-715.36) 30)  Osteoarthritis  (ICD-715.90) 31)  Gerd  (ICD-530.81) 32)  Transient Ischemic Attack, Hx of  (ICD-V12.50) 33)  Cerebrovascular Accident, Hx of  (ICD-V12.50) 34)  Hypertension  (ICD-401.9) 35)  Hyperlipidemia  (ICD-272.4)  Medications Prior to Update: 1)  Ergocalciferol 50000 Unit Caps (Ergocalciferol) .... One By Mouth Weekly 2)  Spiriva Handihaler 18 Mcg Caps (Tiotropium Bromide Monohydrate) .Marland Kitchen.. 1 Puff Once Daily 3)  Prilosec 40 Mg Cpdr (Omeprazole) .Marland Kitchen.. 1 Once Daily As Needed 4)  Cartia Xt 180 Mg Xr24h-Cap (Diltiazem Hcl Coated Beads) .... One By Mouth  Daily 5)  Warfarin Sodium 5 Mg Tabs (Warfarin Sodium) .... 1.5 Once Daily 6)  Alprazolam 0.5 Mg Tabs (Alprazolam) .... One By Mouth Three Times A Day As Needed 7)  Bisoprolol Fumarate 5 Mg Tabs (Bisoprolol Fumarate) .... One By Mouth Daily 8)  Vicodin 5-500 Mg Tabs (Hydrocodone-Acetaminophen) .... One By Mouth Q6 Hours As Needed Pain  Current Medications (verified): 1)  Ergocalciferol 50000 Unit Caps (Ergocalciferol) .... One By Mouth Weekly 2)  Spiriva Handihaler 18 Mcg Caps (Tiotropium Bromide Monohydrate) .Marland Kitchen.. 1 Puff Once Daily 3)  Prilosec 40 Mg Cpdr (Omeprazole) .Marland Kitchen.. 1 Once Daily As Needed 4)  Cartia Xt 180 Mg Xr24h-Cap (Diltiazem Hcl Coated Beads) .... One By Mouth  Daily 5)  Warfarin Sodium 5 Mg Tabs (Warfarin Sodium) .... 1.5  Once Daily 6)  Alprazolam 0.5 Mg Tabs (Alprazolam) .... One By Mouth Three Times A Day As Needed 7)  Bisoprolol Fumarate 5 Mg Tabs (Bisoprolol Fumarate) .... One By Mouth Daily 8)  Vicodin 5-500 Mg Tabs (Hydrocodone-Acetaminophen) .... One By Mouth Q6 Hours As Needed Pain  Allergies (verified): 1)  ! Darvon 2)  ! Darvocet  Past History:  Family History: Last updated: 04/03/2010 Family History Hypertension Fam hx Stroke Family History of Colon CA 1st degree relative <60 SISTER WITH STAGE 4  Social History: Last updated: 03/18/2007 Former Smoker Alcohol use-no Drug use-no Retired Married Regular exercise-no  Risk Factors: Exercise: no (03/18/2007)  Risk Factors: Smoking Status: quit (08/25/2010) Packs/Day: 2.0 (08/25/2010) Passive Smoke Exposure: yes (08/25/2010)  Past medical, surgical, family and social histories (including risk factors) reviewed, and no changes noted (except as noted below).  Past Medical History: Reviewed history from 10/20/2008 and no changes required. 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 Family History Hypertension Fam hx Stroke Family History of Colon CA 1st degree relative <60 SISTER WITH STAGE 4  Social History: Reviewed history from 03/18/2007 and no changes required. Former Smoker Alcohol use-no Drug use-no Retired Married Regular  exercise-no  Review of Systems  The patient denies anorexia, fever, weight loss, weight gain, vision loss, decreased hearing, hoarseness, chest pain, syncope, dyspnea on exertion, peripheral edema, prolonged cough, headaches, hemoptysis, abdominal pain, melena, hematochezia, severe indigestion/heartburn, hematuria, incontinence, genital sores, muscle weakness, suspicious skin lesions, transient blindness, difficulty walking, depression, unusual weight change, abnormal bleeding, enlarged lymph nodes, angioedema, and breast masses.    Physical Exam  General:  Well-developed,well-nourished,in no acute distress; alert,appropriate and cooperative throughout examination Head:  Normocephalic and atraumatic without obvious abnormalities. No apparent alopecia or balding. Eyes:  pupils equal, pupils round, pupils react to accomodation, and corneas and lenses clear.   Ears:  R ear normal and L ear normal.   Nose:  no external deformity and no nasal discharge.   Mouth:  sl posterior erythema without exudate. mild posterior clear drainage. Neck:  supple and no masses.   Lungs:  normal respiratory effort, no dullness, and R wheezes.   Heart:  normal rate, regular rhythm, and Grade   1/6 systolic ejection murmur.   Abdomen:  soft, normal bowel sounds, and incisional hernia.   Msk:   no joint tenderness, no joint swelling, no joint warmth, no redness over joints, and no crepitation.Decreased abduction with passive and active resistance.   Pulses:  R radial normal and L radial normal.   Extremities:  trace left pedal edema and trace right pedal edema.   Neurologic:  strength normal in all extremities and gait normal.     Impression & Recommendations:  Problem # 1:  WEAKNESS (ICD-780.79) Assessment Deteriorated some progressive weakness that may be deconditioninging  Problem # 2:  NECK AND BACK PAIN (ICD-723.1) Assessment: Deteriorated spondylosis Her updated medication list for this problem  includes:    Vicodin 5-500 Mg Tabs (Hydrocodone-acetaminophen) ..... One by mouth q6 hours as needed pain  Discussed exercises and use of moist heat or cold and medication.   Problem # 3:  ANEMIA-NOS (ICD-285.9) Assessment: Unchanged  Hgb: 12.8 (07/02/2010)   Hct: 37.7 (07/02/2010)   Platelets: 237.0 (07/02/2010) RBC: 4.51 (07/02/2010)   RDW: 15.6 (07/02/2010)   WBC: 5.6 (07/02/2010) MCV: 83.6 (07/02/2010)   MCHC: 34.0 (07/02/2010) Iron: 48 (07/02/2010)   % Sat: 12.8 (07/02/2010) TSH: 1.61 (02/13/2010)  Problem # 4:  ATRIAL FIBRILLATION (ICD-427.31)  Her updated medication list for this problem includes:    Cartia Xt 180 Mg Xr24h-cap (Diltiazem hcl coated beads) ..... One by mouth  daily    Warfarin Sodium 5 Mg Tabs (Warfarin sodium) .Marland Kitchen... 1.5 once daily    Bisoprolol Fumarate 5 Mg Tabs (Bisoprolol fumarate) ..... One by mouth daily  Reviewed the following: PT: 17.9 (11/21/2009)   INR: 3.5 (08/11/2010) Next Protime: 4 weeks (dated on 08/11/2010)  Complete Medication List: 1)  Ergocalciferol 50000 Unit Caps (Ergocalciferol) .... One by mouth weekly 2)  Spiriva Handihaler 18 Mcg Caps (Tiotropium bromide monohydrate) .Marland Kitchen.. 1 puff once daily 3)  Prilosec 40 Mg Cpdr (Omeprazole) .Marland Kitchen.. 1 once daily as needed 4)  Cartia Xt 180 Mg Xr24h-cap (Diltiazem hcl coated beads) .... One by mouth  daily 5)  Warfarin Sodium 5 Mg Tabs (Warfarin sodium) .... 1.5 once daily 6)  Alprazolam 0.5 Mg Tabs (Alprazolam) .... One by mouth three times a day as needed 7)  Bisoprolol Fumarate 5 Mg Tabs (Bisoprolol fumarate) .... One by mouth daily 8)  Vicodin 5-500 Mg Tabs (Hydrocodone-acetaminophen) .... One by mouth q6 hours as needed pain  Lipid Assessment/Plan:      Based on NCEP/ATP III, the patient's risk factor category is "history of coronary disease, peripheral vascular disease, cerebrovascular disease, or aortic aneurysm".  The patient's lipid goals are as follows: Total cholesterol goal is 200; LDL  cholesterol goal is 100; HDL cholesterol goal is 40; Triglyceride goal is 150.  Her LDL cholesterol goal has been met.     Patient Instructions: 1)  Please schedule a follow-up appointment in 3 months.   Orders Added: 1)  Est. Patient Level IV [81191]

## 2010-10-15 LAB — PROTIME-INR: INR: 1.02 (ref 0.00–1.49)

## 2010-10-15 LAB — CBC
Hemoglobin: 12.6 g/dL (ref 12.0–15.0)
MCH: 27.8 pg (ref 26.0–34.0)
MCV: 84.2 fL (ref 78.0–100.0)
RBC: 4.51 MIL/uL (ref 3.87–5.11)

## 2010-10-15 LAB — APTT: aPTT: 23 seconds — ABNORMAL LOW (ref 24–37)

## 2010-10-20 LAB — BASIC METABOLIC PANEL
BUN: 17 mg/dL (ref 6–23)
CO2: 27 mEq/L (ref 19–32)
Calcium: 8.9 mg/dL (ref 8.4–10.5)
Creatinine, Ser: 0.97 mg/dL (ref 0.4–1.2)
GFR calc Af Amer: >60 mL/min (ref >60)
GFR calc Af Amer: >60 mL/min (ref >60)
GFR calc non Af Amer: 55 mL/min — ABNORMAL LOW (ref >60)
Glucose, Bld: 104 mg/dL — ABNORMAL HIGH (ref 70–99)
Potassium: 4.4 mEq/L (ref 3.5–5.1)
Sodium: 139 mEq/L (ref 135–145)

## 2010-10-20 LAB — CBC
HCT: 36.3 % (ref 36.0–46.0)
HCT: 38.6 % (ref 36.0–46.0)
Hemoglobin: 11.9 g/dL — ABNORMAL LOW (ref 12.0–15.0)
Hemoglobin: 12.5 g/dL (ref 12.0–15.0)
MCHC: 32.9 g/dL (ref 30.0–36.0)
MCV: 85.9 fL (ref 78.0–100.0)
Platelets: 241 10*3/uL (ref 150–400)
RBC: 4.22 MIL/uL (ref 3.87–5.11)
RBC: 4.53 MIL/uL (ref 3.87–5.11)
WBC: 5.3 10*3/uL (ref 4.0–10.5)

## 2010-10-20 LAB — POCT CARDIAC MARKERS
CKMB, poc: 1.1 ng/mL (ref 1.0–8.0)
Myoglobin, poc: 73.1 ng/mL (ref 12–200)
Troponin i, poc: <0.05 ng/mL (ref 0.00–0.09)
Troponin i, poc: <0.05 ng/mL (ref 0.00–0.09)

## 2010-10-20 LAB — LIPID PANEL
Cholesterol: 149 mg/dL (ref 0–200)
HDL: 28 mg/dL — ABNORMAL LOW (ref >39)
LDL Cholesterol: 81 mg/dL (ref 0–99)
Total CHOL/HDL Ratio: 5.3 RATIO

## 2010-10-20 LAB — DIFFERENTIAL
Eosinophils Relative: 2 % (ref 0–5)
Lymphocytes Relative: 27 % (ref 12–46)
Lymphs Abs: 1.4 10*3/uL (ref 0.7–4.0)
Monocytes Absolute: 0.6 10*3/uL (ref 0.1–1.0)

## 2010-10-20 LAB — CARDIAC PANEL(CRET KIN+CKTOT+MB+TROPI)
Relative Index: INVALID (ref 0.0–2.5)
Troponin I: 0.01 ng/mL (ref 0.00–0.06)

## 2010-10-29 ENCOUNTER — Encounter: Payer: Self-pay | Admitting: Internal Medicine

## 2010-10-30 ENCOUNTER — Ambulatory Visit (INDEPENDENT_AMBULATORY_CARE_PROVIDER_SITE_OTHER): Payer: 59 | Admitting: Internal Medicine

## 2010-10-30 ENCOUNTER — Encounter: Payer: Self-pay | Admitting: Internal Medicine

## 2010-10-30 DIAGNOSIS — M7501 Adhesive capsulitis of right shoulder: Secondary | ICD-10-CM | POA: Insufficient documentation

## 2010-10-30 DIAGNOSIS — I10 Essential (primary) hypertension: Secondary | ICD-10-CM

## 2010-10-30 DIAGNOSIS — D649 Anemia, unspecified: Secondary | ICD-10-CM

## 2010-10-30 DIAGNOSIS — J449 Chronic obstructive pulmonary disease, unspecified: Secondary | ICD-10-CM

## 2010-10-30 DIAGNOSIS — I4891 Unspecified atrial fibrillation: Secondary | ICD-10-CM

## 2010-10-30 DIAGNOSIS — I8310 Varicose veins of unspecified lower extremity with inflammation: Secondary | ICD-10-CM

## 2010-10-30 DIAGNOSIS — R0602 Shortness of breath: Secondary | ICD-10-CM

## 2010-10-30 DIAGNOSIS — M7500 Adhesive capsulitis of unspecified shoulder: Secondary | ICD-10-CM

## 2010-10-30 LAB — CBC WITH DIFFERENTIAL/PLATELET
Basophils Relative: 0.5 % (ref 0.0–3.0)
Eosinophils Absolute: 0.1 10*3/uL (ref 0.0–0.7)
Lymphs Abs: 1.6 10*3/uL (ref 0.7–4.0)
MCHC: 33.2 g/dL (ref 30.0–36.0)
MCV: 84.4 fl (ref 78.0–100.0)
Monocytes Absolute: 0.6 10*3/uL (ref 0.1–1.0)
Neutrophils Relative %: 63.2 % (ref 43.0–77.0)
Platelets: 261 10*3/uL (ref 150.0–400.0)

## 2010-10-30 LAB — POCT INR: INR: 2.5

## 2010-10-30 NOTE — Patient Instructions (Signed)
Same dose of coumadin 

## 2010-10-30 NOTE — Assessment & Plan Note (Signed)
Not using the spiriva daily as directed

## 2010-10-30 NOTE — Progress Notes (Signed)
Addended by: Rita Ohara on: 10/30/2010 12:12 PM   Modules accepted: Orders

## 2010-10-30 NOTE — Progress Notes (Signed)
  Subjective:    Patient ID: [name], female    DOB: 1928/02/15, 77 y.o.   MRN: [id number]  HPI Issue is an 77 year old female with a history of abdominal pain distention constipation and degenerative joint disease history of chronic atrial fibrillation on Coumadin.  She presents today feeling generally well from the standpoint of her cardiovascular disease she has abdominal tension and this is primarily due to dietary indiscretion she admits to eating too many sweets too much bread and meat carbohydrates.  She has a chief complaint today of shoulder pain when she tries to lift her left shoulder above 90 it catches and is painful when she rolls over at night it's painful she states that she cannot tolerate nonsteroidals so she'll use Tylenol only for pain control and this has not been adequate   Review of Systems  Constitutional: Negative for activity change, appetite change and fatigue.  HENT: Negative for ear pain, congestion, neck pain, postnasal drip and sinus pressure.   Eyes: Negative for redness and visual disturbance.  Respiratory: Negative for cough, shortness of breath and wheezing.   Gastrointestinal: Negative for abdominal pain and abdominal distention.  Genitourinary: Negative for dysuria, frequency and menstrual problem.  Musculoskeletal: Negative for myalgias, joint swelling and arthralgias.  Skin: Negative for rash and wound.  Neurological: Negative for dizziness, weakness and headaches.  Hematological: Negative for adenopathy. Does not bruise/bleed easily.  Psychiatric/Behavioral: Negative for sleep disturbance and decreased concentration.   Past Medical History  Diagnosis Date  . GERD (gastroesophageal reflux disease)   . Arthritis   . COPD (chronic obstructive pulmonary disease)   . Stroke   . History of TIAs   . Diverticulosis   . PUD (peptic ulcer disease)   . PVD (peripheral vascular disease)   . Hyperlipidemia   . Anxiety   . Depression   .  Laryngeal carcinoma hx of ca    remotely with resection and xrt/chronic hoarsemenss   Past Surgical History  Procedure Date  . Appendectomy     reports that she has quit smoking. She does not have any smokeless tobacco history on file. She reports that she does not drink alcohol or use illicit drugs. family history includes Cancer in her father and sister; Hyperlipidemia in her mother; and Stroke in her mother. Allergies  Allergen Reactions  . Propoxyphene Hcl   . Propoxyphene N-Acetaminophen     REACTION: Reaction not known       Objective:   Physical Exam  Constitutional: She appears well-developed and well-nourished.  HENT:  Head: Normocephalic and atraumatic.  Eyes: Conjunctivae are normal. Pupils are equal, round, and reactive to light.  Neck: Normal range of motion. Neck supple.  Cardiovascular: Normal rate and regular rhythm.   Abdominal: She exhibits distension. There is tenderness.  Skin: Skin is warm and dry.          Assessment & Plan:

## 2010-10-30 NOTE — Assessment & Plan Note (Signed)
The patient has compression stockings but does not wear them as often as she should hurt his pains have progressed and she has marked red and retinacular vein development in her ankles

## 2010-10-30 NOTE — Assessment & Plan Note (Signed)
Blood pressure is stable today after recheck

## 2010-10-30 NOTE — Assessment & Plan Note (Signed)
Paroxysmal atrial fibrillation today she is in sinus rhythm

## 2010-10-30 NOTE — Assessment & Plan Note (Signed)
Patient as adhesive capsulitis of her left shoulder she gave informed consent and the PICC site was prepped with Betadine and injection of 40 mg of Depo-Medrol and a half cc of 2% lidocaine was placed into the joint space patient tolerated the injection aftercare discussed

## 2010-10-30 NOTE — Assessment & Plan Note (Signed)
Patient has a history of anemia we'll check a CBC today

## 2010-10-30 NOTE — Assessment & Plan Note (Signed)
Breathing is stable.

## 2010-11-11 LAB — CLOSTRIDIUM DIFFICILE EIA: C difficile Toxins A+B, EIA: NEGATIVE

## 2010-11-11 LAB — PROTIME-INR
INR: 1 (ref 0.00–1.49)
INR: 1.1 (ref 0.00–1.49)
INR: 1.1 (ref 0.00–1.49)
Prothrombin Time: 13 seconds (ref 11.6–15.2)
Prothrombin Time: 13 seconds (ref 11.6–15.2)

## 2010-11-11 LAB — BASIC METABOLIC PANEL
BUN: 11 mg/dL (ref 6–23)
BUN: 15 mg/dL (ref 6–23)
CO2: 21 mEq/L (ref 19–32)
CO2: 23 mEq/L (ref 19–32)
CO2: 28 mEq/L (ref 19–32)
Calcium: 9 mg/dL (ref 8.4–10.5)
Calcium: 9.4 mg/dL (ref 8.4–10.5)
Chloride: 102 mEq/L (ref 96–112)
Creatinine, Ser: 0.92 mg/dL (ref 0.4–1.2)
Creatinine, Ser: 1.05 mg/dL (ref 0.4–1.2)
GFR calc Af Amer: >60 mL/min (ref >60)
GFR calc Af Amer: >60 mL/min (ref >60)
GFR calc Af Amer: >60 mL/min (ref >60)
Glucose, Bld: 121 mg/dL — ABNORMAL HIGH (ref 70–99)
Sodium: 139 mEq/L (ref 135–145)

## 2010-11-11 LAB — CBC
HCT: 37.9 % (ref 36.0–46.0)
Hemoglobin: 10.7 g/dL — ABNORMAL LOW (ref 12.0–15.0)
MCHC: 34.2 g/dL (ref 30.0–36.0)
MCHC: 34.3 g/dL (ref 30.0–36.0)
MCV: 86 fL (ref 78.0–100.0)
Platelets: 290 10*3/uL (ref 150–400)
Platelets: ADEQUATE 10*3/uL (ref 150–400)
RBC: 3.63 MIL/uL — ABNORMAL LOW (ref 3.87–5.11)
RDW: 16.8 % — ABNORMAL HIGH (ref 11.5–15.5)
RDW: 17 % — ABNORMAL HIGH (ref 11.5–15.5)
WBC: 6.2 10*3/uL (ref 4.0–10.5)

## 2010-11-11 LAB — DIFFERENTIAL
Basophils Absolute: 0 10*3/uL (ref 0.0–0.1)
Basophils Relative: 1 % (ref 0–1)
Eosinophils Absolute: 0.1 10*3/uL (ref 0.0–0.7)
Eosinophils Relative: 2 % (ref 0–5)
Lymphocytes Relative: 26 % (ref 12–46)

## 2010-11-13 ENCOUNTER — Other Ambulatory Visit: Payer: Self-pay | Admitting: Internal Medicine

## 2010-11-13 DIAGNOSIS — F419 Anxiety disorder, unspecified: Secondary | ICD-10-CM

## 2010-11-13 LAB — POCT I-STAT, CHEM 8
BUN: 19 mg/dL (ref 6–23)
Calcium, Ion: 1.19 mmol/L (ref 1.12–1.32)
Chloride: 110 meq/L (ref 96–112)
Creatinine, Ser: 1.2 mg/dL (ref 0.4–1.2)
Glucose, Bld: 122 mg/dL — ABNORMAL HIGH (ref 70–99)
HCT: 36 % (ref 36.0–46.0)
Hemoglobin: 12.2 g/dL (ref 12.0–15.0)
Potassium: 4.4 meq/L (ref 3.5–5.1)
Sodium: 135 meq/L (ref 135–145)
TCO2: 17 mmol/L (ref 0–100)

## 2010-11-13 LAB — COMPREHENSIVE METABOLIC PANEL
ALT: 14 U/L (ref 0–35)
AST: 12 U/L (ref 0–37)
AST: 14 U/L (ref 0–37)
Albumin: 3.4 g/dL — ABNORMAL LOW (ref 3.5–5.2)
BUN: 12 mg/dL (ref 6–23)
CO2: 20 mEq/L (ref 19–32)
Calcium: 10 mg/dL (ref 8.4–10.5)
Calcium: 9.1 mg/dL (ref 8.4–10.5)
Creatinine, Ser: 1.35 mg/dL — ABNORMAL HIGH (ref 0.4–1.2)
GFR calc Af Amer: 46 mL/min — ABNORMAL LOW (ref >60)
GFR calc Af Amer: 49 mL/min — ABNORMAL LOW (ref >60)
GFR calc non Af Amer: 38 mL/min — ABNORMAL LOW (ref >60)
Glucose, Bld: 110 mg/dL — ABNORMAL HIGH (ref 70–99)
Sodium: 130 mEq/L — ABNORMAL LOW (ref 135–145)
Total Protein: 7.6 g/dL (ref 6.0–8.3)

## 2010-11-13 LAB — CULTURE, BLOOD (ROUTINE X 2)
Culture: NO GROWTH
Culture: NO GROWTH

## 2010-11-13 LAB — DIFFERENTIAL
Basophils Absolute: 0 10*3/uL (ref 0.0–0.1)
Basophils Relative: 0 % (ref 0–1)
Eosinophils Absolute: 0.1 10*3/uL (ref 0.0–0.7)
Eosinophils Relative: 1 % (ref 0–5)
Lymphocytes Relative: 8 % — ABNORMAL LOW (ref 12–46)
Lymphs Abs: 1.1 10*3/uL (ref 0.7–4.0)
Monocytes Absolute: 1 10*3/uL (ref 0.1–1.0)
Monocytes Relative: 7 % (ref 3–12)
Neutro Abs: 11.3 10*3/uL — ABNORMAL HIGH (ref 1.7–7.7)
Neutrophils Relative %: 84 % — ABNORMAL HIGH (ref 43–77)

## 2010-11-13 LAB — CBC
HCT: 40.6 % (ref 36.0–46.0)
HCT: 35.6 % — ABNORMAL LOW (ref 36.0–46.0)
HCT: 36.6 % (ref 36.0–46.0)
HCT: 46.7 % — ABNORMAL HIGH (ref 36.0–46.0)
Hemoglobin: 11 g/dL — ABNORMAL LOW (ref 12.0–15.0)
Hemoglobin: 13.2 g/dL (ref 12.0–15.0)
Hemoglobin: 11.8 g/dL — ABNORMAL LOW (ref 12.0–15.0)
Hemoglobin: 12 g/dL (ref 12.0–15.0)
Hemoglobin: 15.6 g/dL — ABNORMAL HIGH (ref 12.0–15.0)
MCHC: 32.5 g/dL (ref 30.0–36.0)
MCHC: 32.9 g/dL (ref 30.0–36.0)
MCHC: 32.8 g/dL (ref 30.0–36.0)
MCHC: 33 g/dL (ref 30.0–36.0)
MCHC: 33.5 g/dL (ref 30.0–36.0)
MCV: 86.6 fL (ref 78.0–100.0)
MCV: 86 fL (ref 78.0–100.0)
MCV: 86.7 fL (ref 78.0–100.0)
Platelets: 309 10*3/uL (ref 150–400)
RBC: 3.89 MIL/uL (ref 3.87–5.11)
RBC: 4.69 MIL/uL (ref 3.87–5.11)
RBC: 3.94 MIL/uL (ref 3.87–5.11)
RBC: 4.11 MIL/uL (ref 3.87–5.11)
RDW: 15.1 % (ref 11.5–15.5)
RDW: 13.8 % (ref 11.5–15.5)
RDW: 14.9 % (ref 11.5–15.5)
WBC: 13.5 10*3/uL — ABNORMAL HIGH (ref 4.0–10.5)
WBC: 7.9 10*3/uL (ref 4.0–10.5)

## 2010-11-13 LAB — BASIC METABOLIC PANEL
CO2: 19 mEq/L (ref 19–32)
CO2: 19 mEq/L (ref 19–32)
Calcium: 9 mg/dL (ref 8.4–10.5)
Chloride: 111 mEq/L (ref 96–112)
Chloride: 109 mEq/L (ref 96–112)
GFR calc Af Amer: >60 mL/min (ref >60)
GFR calc Af Amer: 57 mL/min — ABNORMAL LOW (ref >60)
Glucose, Bld: 110 mg/dL — ABNORMAL HIGH (ref 70–99)
Potassium: 4.4 mEq/L (ref 3.5–5.1)
Potassium: 3.7 mEq/L (ref 3.5–5.1)
Potassium: 4.1 mEq/L (ref 3.5–5.1)
Sodium: 134 mEq/L — ABNORMAL LOW (ref 135–145)
Sodium: 131 mEq/L — ABNORMAL LOW (ref 135–145)
Sodium: 135 mEq/L (ref 135–145)

## 2010-11-13 LAB — URINALYSIS, ROUTINE W REFLEX MICROSCOPIC
Glucose, UA: NEGATIVE mg/dL
Glucose, UA: NEGATIVE mg/dL
Leukocytes, UA: NEGATIVE
Protein, ur: 30 mg/dL — AB
Protein, ur: NEGATIVE mg/dL
pH: 6 (ref 5.0–8.0)
pH: 5.5 (ref 5.0–8.0)

## 2010-11-13 LAB — COMPREHENSIVE METABOLIC PANEL WITH GFR
ALT: 13 U/L (ref 0–35)
AST: 15 U/L (ref 0–37)
Albumin: 2.9 g/dL — ABNORMAL LOW (ref 3.5–5.2)
Alkaline Phosphatase: 94 U/L (ref 39–117)
BUN: 18 mg/dL (ref 6–23)
CO2: 18 meq/L — ABNORMAL LOW (ref 19–32)
Calcium: 9.7 mg/dL (ref 8.4–10.5)
Chloride: 106 meq/L (ref 96–112)
Creatinine, Ser: 1.42 mg/dL — ABNORMAL HIGH (ref 0.4–1.2)
GFR calc non Af Amer: 36 mL/min — ABNORMAL LOW
Glucose, Bld: 148 mg/dL — ABNORMAL HIGH (ref 70–99)
Potassium: 5 meq/L (ref 3.5–5.1)
Sodium: 135 meq/L (ref 135–145)
Total Bilirubin: 0.8 mg/dL (ref 0.3–1.2)
Total Protein: 7.4 g/dL (ref 6.0–8.3)

## 2010-11-13 LAB — URINE CULTURE
Colony Count: 30000
Special Requests: NEGATIVE

## 2010-11-13 LAB — VITAMIN B1: Vitamin B1 (Thiamine): <7 nmol/L — ABNORMAL LOW (ref 9–44)

## 2010-11-13 LAB — LIPASE, BLOOD: Lipase: 65 U/L — ABNORMAL HIGH (ref 11–59)

## 2010-11-13 LAB — CK TOTAL AND CKMB (NOT AT ARMC)
CK, MB: 1 ng/mL (ref 0.3–4.0)
Relative Index: INVALID (ref 0.0–2.5)
Relative Index: INVALID (ref 0.0–2.5)
Total CK: 14 U/L (ref 7–177)
Total CK: 15 U/L (ref 7–177)

## 2010-11-13 LAB — PROTIME-INR
INR: 1.1 (ref 0.00–1.49)
Prothrombin Time: 14.3 seconds (ref 11.6–15.2)
Prothrombin Time: 13.8 seconds (ref 11.6–15.2)

## 2010-11-13 LAB — URINE MICROSCOPIC-ADD ON

## 2010-11-13 LAB — HEMOGLOBIN A1C: Mean Plasma Glucose: 128 mg/dL

## 2010-11-17 ENCOUNTER — Other Ambulatory Visit: Payer: Self-pay | Admitting: *Deleted

## 2010-11-17 MED ORDER — WARFARIN SODIUM 5 MG PO TABS: 7.5000 mg | ORAL_TABLET | Freq: Every day | ORAL | Status: DC

## 2010-11-18 LAB — CBC
HCT: 27.3 % — ABNORMAL LOW (ref 36.0–46.0)
HCT: 27.9 % — ABNORMAL LOW (ref 36.0–46.0)
HCT: 35.9 % — ABNORMAL LOW (ref 36.0–46.0)
HCT: 23.7 % — ABNORMAL LOW (ref 36.0–46.0)
HCT: 26.9 % — ABNORMAL LOW (ref 36.0–46.0)
HCT: 27.5 % — ABNORMAL LOW (ref 36.0–46.0)
Hemoglobin: 11.9 g/dL — ABNORMAL LOW (ref 12.0–15.0)
Hemoglobin: 9.3 g/dL — ABNORMAL LOW (ref 12.0–15.0)
Hemoglobin: 9.3 g/dL — ABNORMAL LOW (ref 12.0–15.0)
Hemoglobin: 10.7 g/dL — ABNORMAL LOW (ref 12.0–15.0)
Hemoglobin: 8 g/dL — ABNORMAL LOW (ref 12.0–15.0)
Hemoglobin: 8.8 g/dL — ABNORMAL LOW (ref 12.0–15.0)
Hemoglobin: 9.2 g/dL — ABNORMAL LOW (ref 12.0–15.0)
MCHC: 32.7 g/dL (ref 30.0–36.0)
MCHC: 33.5 g/dL (ref 30.0–36.0)
MCHC: 33.5 g/dL (ref 30.0–36.0)
MCHC: 33.6 g/dL (ref 30.0–36.0)
MCHC: 34.2 g/dL (ref 30.0–36.0)
MCHC: 34.3 g/dL (ref 30.0–36.0)
MCHC: 34.5 g/dL (ref 30.0–36.0)
MCV: 85.7 fL (ref 78.0–100.0)
MCV: 86.2 fL (ref 78.0–100.0)
MCV: 86.3 fL (ref 78.0–100.0)
MCV: 87.1 fL (ref 78.0–100.0)
MCV: 85.5 fL (ref 78.0–100.0)
MCV: 85.3 fL (ref 78.0–100.0)
MCV: 86 fL (ref 78.0–100.0)
MCV: 86.2 fL (ref 78.0–100.0)
Platelets: 208 10*3/uL (ref 150–400)
Platelets: 218 10*3/uL (ref 150–400)
Platelets: 248 10*3/uL (ref 150–400)
Platelets: 279 10*3/uL (ref 150–400)
Platelets: 266 10*3/uL (ref 150–400)
Platelets: 295 10*3/uL (ref 150–400)
Platelets: 328 10*3/uL (ref 150–400)
RBC: 3.24 MIL/uL — ABNORMAL LOW (ref 3.87–5.11)
RBC: 3.46 MIL/uL — ABNORMAL LOW (ref 3.87–5.11)
RBC: 3.24 MIL/uL — ABNORMAL LOW (ref 3.87–5.11)
RBC: 2.76 MIL/uL — ABNORMAL LOW (ref 3.87–5.11)
RBC: 3 MIL/uL — ABNORMAL LOW (ref 3.87–5.11)
RBC: 3.13 MIL/uL — ABNORMAL LOW (ref 3.87–5.11)
RBC: 3.23 MIL/uL — ABNORMAL LOW (ref 3.87–5.11)
RBC: 3.65 MIL/uL — ABNORMAL LOW (ref 3.87–5.11)
RBC: 3.84 MIL/uL — ABNORMAL LOW (ref 3.87–5.11)
RBC: 3.98 MIL/uL (ref 3.87–5.11)
RDW: 17.3 % — ABNORMAL HIGH (ref 11.5–15.5)
RDW: 17.3 % — ABNORMAL HIGH (ref 11.5–15.5)
RDW: 14.3 % (ref 11.5–15.5)
RDW: 14.9 % (ref 11.5–15.5)
RDW: 15.1 % (ref 11.5–15.5)
RDW: 15.6 % — ABNORMAL HIGH (ref 11.5–15.5)
RDW: 15.7 % — ABNORMAL HIGH (ref 11.5–15.5)
RDW: 15.9 % — ABNORMAL HIGH (ref 11.5–15.5)
WBC: 5.5 10*3/uL (ref 4.0–10.5)
WBC: 8.6 10*3/uL (ref 4.0–10.5)
WBC: 4.9 10*3/uL (ref 4.0–10.5)
WBC: 10.1 10*3/uL (ref 4.0–10.5)
WBC: 8 10*3/uL (ref 4.0–10.5)

## 2010-11-18 LAB — BASIC METABOLIC PANEL
BUN: 1 mg/dL — ABNORMAL LOW (ref 6–23)
BUN: 11 mg/dL (ref 6–23)
BUN: 9 mg/dL (ref 6–23)
BUN: 22 mg/dL (ref 6–23)
CO2: 25 mEq/L (ref 19–32)
CO2: 26 mEq/L (ref 19–32)
CO2: 23 mEq/L (ref 19–32)
CO2: 23 mEq/L (ref 19–32)
CO2: 23 mEq/L (ref 19–32)
CO2: 24 mEq/L (ref 19–32)
CO2: 26 mEq/L (ref 19–32)
Calcium: 8.8 mg/dL (ref 8.4–10.5)
Calcium: 8.9 mg/dL (ref 8.4–10.5)
Calcium: 7.2 mg/dL — ABNORMAL LOW (ref 8.4–10.5)
Calcium: 8 mg/dL — ABNORMAL LOW (ref 8.4–10.5)
Calcium: 8 mg/dL — ABNORMAL LOW (ref 8.4–10.5)
Calcium: 8.3 mg/dL — ABNORMAL LOW (ref 8.4–10.5)
Chloride: 101 mEq/L (ref 96–112)
Chloride: 106 mEq/L (ref 96–112)
Chloride: 108 mEq/L (ref 96–112)
Chloride: 102 mEq/L (ref 96–112)
Chloride: 101 mEq/L (ref 96–112)
Chloride: 106 mEq/L (ref 96–112)
Creatinine, Ser: 0.71 mg/dL (ref 0.4–1.2)
Creatinine, Ser: 0.98 mg/dL (ref 0.4–1.2)
Creatinine, Ser: 0.81 mg/dL (ref 0.4–1.2)
Creatinine, Ser: 0.72 mg/dL (ref 0.4–1.2)
Creatinine, Ser: 0.88 mg/dL (ref 0.4–1.2)
Creatinine, Ser: 1.48 mg/dL — ABNORMAL HIGH (ref 0.4–1.2)
GFR calc Af Amer: >60 mL/min (ref >60)
GFR calc Af Amer: >60 mL/min (ref >60)
GFR calc Af Amer: 56 mL/min — ABNORMAL LOW (ref >60)
GFR calc Af Amer: >60 mL/min (ref >60)
GFR calc Af Amer: >60 mL/min (ref >60)
GFR calc Af Amer: >60 mL/min (ref >60)
GFR calc non Af Amer: 46 mL/min — ABNORMAL LOW (ref >60)
GFR calc non Af Amer: 56 mL/min — ABNORMAL LOW (ref >60)
GFR calc non Af Amer: >60 mL/min (ref >60)
GFR calc non Af Amer: >60 mL/min (ref >60)
GFR calc non Af Amer: >60 mL/min (ref >60)
Glucose, Bld: 119 mg/dL — ABNORMAL HIGH (ref 70–99)
Glucose, Bld: 112 mg/dL — ABNORMAL HIGH (ref 70–99)
Glucose, Bld: 128 mg/dL — ABNORMAL HIGH (ref 70–99)
Glucose, Bld: 130 mg/dL — ABNORMAL HIGH (ref 70–99)
Potassium: 4.4 mEq/L (ref 3.5–5.1)
Potassium: 4.7 mEq/L (ref 3.5–5.1)
Potassium: 3.5 mEq/L (ref 3.5–5.1)
Potassium: 3.6 mEq/L (ref 3.5–5.1)
Potassium: 3.7 mEq/L (ref 3.5–5.1)
Potassium: 3.8 mEq/L (ref 3.5–5.1)
Sodium: 137 mEq/L (ref 135–145)
Sodium: 137 mEq/L (ref 135–145)
Sodium: 133 mEq/L — ABNORMAL LOW (ref 135–145)
Sodium: 135 mEq/L (ref 135–145)
Sodium: 136 mEq/L (ref 135–145)

## 2010-11-18 LAB — CULTURE, BLOOD (ROUTINE X 2)

## 2010-11-18 LAB — COMPREHENSIVE METABOLIC PANEL
ALT: 15 U/L (ref 0–35)
AST: 17 U/L (ref 0–37)
Albumin: 3.6 g/dL (ref 3.5–5.2)
Albumin: 3 g/dL — ABNORMAL LOW (ref 3.5–5.2)
Alkaline Phosphatase: 66 U/L (ref 39–117)
BUN: 13 mg/dL (ref 6–23)
CO2: 24 mEq/L (ref 19–32)
Chloride: 100 mEq/L (ref 96–112)
Creatinine, Ser: 1 mg/dL (ref 0.4–1.2)
GFR calc Af Amer: 53 mL/min — ABNORMAL LOW (ref >60)
Glucose, Bld: 119 mg/dL — ABNORMAL HIGH (ref 70–99)
Potassium: 3.4 mEq/L — ABNORMAL LOW (ref 3.5–5.1)
Sodium: 134 mEq/L — ABNORMAL LOW (ref 135–145)
Total Bilirubin: 0.7 mg/dL (ref 0.3–1.2)
Total Bilirubin: 1.1 mg/dL (ref 0.3–1.2)
Total Protein: 7.1 g/dL (ref 6.0–8.3)

## 2010-11-18 LAB — ANAEROBIC CULTURE

## 2010-11-18 LAB — URINE CULTURE

## 2010-11-18 LAB — POCT I-STAT 3, ART BLOOD GAS (G3+)
Bicarbonate: 22.8 mEq/L (ref 20.0–24.0)
O2 Saturation: 97 %
Patient temperature: 98.8
TCO2: 24 mmol/L (ref 0–100)
pH, Arterial: 7.377 (ref 7.350–7.400)

## 2010-11-18 LAB — DIFFERENTIAL
Basophils Absolute: 0 10*3/uL (ref 0.0–0.1)
Basophils Absolute: 0 10*3/uL (ref 0.0–0.1)
Basophils Relative: 0 % (ref 0–1)
Basophils Relative: 0 % (ref 0–1)
Eosinophils Absolute: 0 10*3/uL (ref 0.0–0.7)
Eosinophils Relative: 0 % (ref 0–5)
Lymphocytes Relative: 26 % (ref 12–46)
Lymphocytes Relative: 5 % — ABNORMAL LOW (ref 12–46)
Monocytes Absolute: 0.5 10*3/uL (ref 0.1–1.0)
Monocytes Absolute: 0.3 10*3/uL (ref 0.1–1.0)
Monocytes Relative: 8 % (ref 3–12)
Neutro Abs: 4.2 10*3/uL (ref 1.7–7.7)
Neutrophils Relative %: 64 % (ref 43–77)

## 2010-11-18 LAB — CROSSMATCH: ABO/RH(D): O POS

## 2010-11-18 LAB — GLUCOSE, CAPILLARY
Glucose-Capillary: 131 mg/dL — ABNORMAL HIGH (ref 70–99)
Glucose-Capillary: 154 mg/dL — ABNORMAL HIGH (ref 70–99)

## 2010-11-18 LAB — URINALYSIS, ROUTINE W REFLEX MICROSCOPIC
Ketones, ur: 15 mg/dL — AB
Protein, ur: 30 mg/dL — AB
Urobilinogen, UA: 1 mg/dL (ref 0.0–1.0)

## 2010-11-18 LAB — CULTURE, ROUTINE-ABSCESS

## 2010-11-18 LAB — PROTIME-INR: Prothrombin Time: 18.1 seconds — ABNORMAL HIGH (ref 11.6–15.2)

## 2010-11-18 LAB — URINE MICROSCOPIC-ADD ON

## 2010-11-27 ENCOUNTER — Other Ambulatory Visit (INDEPENDENT_AMBULATORY_CARE_PROVIDER_SITE_OTHER): Payer: 59 | Admitting: Internal Medicine

## 2010-11-27 DIAGNOSIS — I4891 Unspecified atrial fibrillation: Secondary | ICD-10-CM

## 2010-11-27 LAB — POCT INR: INR: 3.2

## 2010-11-27 NOTE — Patient Instructions (Signed)
Same dose 

## 2010-12-08 ENCOUNTER — Other Ambulatory Visit: Payer: Self-pay | Admitting: Internal Medicine

## 2010-12-11 ENCOUNTER — Ambulatory Visit (INDEPENDENT_AMBULATORY_CARE_PROVIDER_SITE_OTHER): Payer: 59 | Admitting: Internal Medicine

## 2010-12-11 DIAGNOSIS — I4891 Unspecified atrial fibrillation: Secondary | ICD-10-CM

## 2010-12-11 LAB — POCT INR: INR: 2.4

## 2010-12-11 NOTE — Patient Instructions (Signed)
Same dose 

## 2010-12-16 NOTE — Discharge Summary (Signed)
NAMESHADDAI, SHAPLEY            ACCOUNT NO.:  [id number]   MEDICAL RECORD NO.:  [id number]          PATIENT TYPE:  INP   LOCATION:  5157                         FACILITY:  [facility]   PHYSICIAN:  Wilmon Arms. Corliss Skains, M.D. DATE OF BIRTH:  February 07, 1928   DATE OF ADMISSION:  09/21/2008  DATE OF DISCHARGE:  09/29/2008                               DISCHARGE SUMMARY   ADMISSION DIAGNOSIS:  Abdominal sepsis with anastomotic leak.   DISCHARGE DIAGNOSIS:  Abdominal sepsis with anastomotic leak.   PROCEDURE:  Exploratory laparotomy, drainage of pelvic abscess, and  diverting loop ileostomy.   BRIEF HISTORY:  This is a 77 year old female who is status post sigmoid  colectomy on September 13, 2008, for recurrent diverticulitis.  The  patient was discharged on September 19, 2008, and was having bowel  movements and normal white count and was eating normally.  After going  home, she developed fever up to 102.9 with increasing lower abdominal  pain and waxing and waning mental status as well as poor appetite.  She  was brought back to the emergency department where she was readmitted on  September 21, 2008.   HOSPITAL COURSE:  An acute abdominal series showed a dilated loop of  small bowel possibly consistent with an ileus and possible small amount  of free air.  She then underwent a CT scan of the abdomen and pelvis  which showed a large pelvic abscess with a possible anastomotic leak.  She was emergently brought to the operating room early morning of  September 22, 2008, where she underwent exploratory laparotomy.  At that  time, we noted a tiny leak at the anterior portion of her colorectal  anastomosis.  This area was thoroughly washed out.  Drains were placed.  We created a diverting loop ileostomy in the right lower quadrant.  She  was transported to the ICU, still intubated.  The patient was then  extubated on postop day #1.  Her sepsis quickly resolved.  Her white  count came back down to  normal.  She did require a transfusion of 2  units of packed red blood cells.  She continued to progress well.  She  was transferred out of the intensive care unit on postop day #3.  She  was maintained on Zosyn antibiotics, and her pain was controlled with  morphine PCA.  Her ileostomy began functioning quickly, and her  nasogastric tube was removed on postop day #2.  She was then started on  clear liquids which was quickly advanced. .  DVT prophylaxis was  initiated using subcu heparin.  The patient began ambulating with  Physical Therapy and Occupational Therapy.  Her diet was advanced to  regular diet.  The patient continued to improve and was discharged home  on September 29, 2008, with Home Health Physical Therapy.   DISCHARGE INSTRUCTIONS:  The patient is going home with her dressing in  placed.  She is to follow up with Dr. Corliss Skains next week for removal of  these drains.  She was given p.r.n. Vicodin.  She should refrain from  any heavy lifting.  Call our office at  [phone] for fever, increasing  pain, severe nausea, and vomiting.  She is going home on Augmentin  antibiotics to complete a 2-week course.      Wilmon Arms. Tsuei, M.D.  Electronically Signed    MKT/MEDQ  D:  11/07/2008  T:  11/07/2008  Job:  [id number]

## 2010-12-16 NOTE — Assessment & Plan Note (Signed)
[facility]                            CARDIOLOGY OFFICE NOTE   NAME:[name], [name]                   MRN:          [id number]  DATE:03/28/2008                            DOB:          08-Dec-1927    HISTORY OF PRESENT ILLNESS:  [name] is a delightful 77 year old  patient of Dr. Lovell Sheehan.  She is referred for followup of her carotid  disease.  The patient has had bilateral carotid endarterectomies.  These  were done over 5 years ago in [place], [place].  She initially  describes as having stents.  However, she clearly has endarterectomy  scars and did not have a percutaneous procedure.   She did present with a mild right-sided CVA.  She had a left carotid  done in about a month or 2, later had the right done.  As far as I can  tell, she has not had followup carotid duplexes.  She apparently was  talking to Dr. Lovell Sheehan and asked how her stents were doing, and Dr.  Lovell Sheehan indicated that it was probably time to find out.   The patient describes as having a stress test 3 or 4 years ago.  She  does not have documented coronary disease.  There has been no PND or  orthopnea, no palpitations.   She does describe some cramping in her legs, which may be claudication.  However, they tend to recur with standing, not just with ambulation.  Again, as far as I can tell, she has not been screened for ABIs.   Her coronary risk factors include hypertension and hypercholesterolemia.   PAST MEDICAL HISTORY:  Otherwise remarkable for tubal ligation and  bilateral carotid endarterectomies.  She does have some constipation,  history of peptic ulcer disease and reflux, history of anxiety and  depression.   SOCIAL HISTORY:  The patient is married.  Her husband is 53 years old.  He is a long-time alcoholic who has been sober over the last 20 years.  Her marriage was difficult at times.  She moved to [place] from  [place] but is originally from [place].  She moved here to be with 2  of her daughters.  She has 14 grandchildren.  She does not smoke or  drink.   ALLERGIES:  She is intolerant to DARVON.   MEDICATIONS:  1. Zocor 40 a day.  2. Aspirin a day.  3. Bystolic 5 a day.  4. Vitamin D.  5. Flaxseed oil.   FAMILY HISTORY:  Noncontributory.   PHYSICAL EXAMINATION:  GENERAL:  Remarkable for an elderly white female,  in no distress.  Affect is appropriate.  VITAL SIGNS:  Weight 198, blood pressure 150/80, pulse 80 and regular,  respiratory rate 14, afebrile.  HEENT:  Unremarkable.  NECK:  She has a right carotid bruit with previous CEA scars.  LUNGS:  Clear.  Good diaphragmatic motion.  No wheezing.  S1 and S2.  Normal heart sounds.  PMI normal.  ABDOMEN:  Benign.  There is no AAA, no tenderness, no bruit, no  hepatosplenomegaly, no hepatojugular reflux, no tenderness.  EXTREMITIES:  PTs are +2 bilaterally.  SKIN:  Warm and dry.  MUSCULOSKELETAL:  There is no muscular weakness.   EKG is normal.   IMPRESSION:  1. Previous bilateral carotid endarterectomies with residual right      carotid bruit.  Followup carotid duplex.  2. Hyperlipidemia.  Continue Zocor.  Target LDL would be 70 or less.      Consider switching to Vytorin or a stronger statin drug if goal not      reached.  3. Hypertension.  Continue Bystolic, borderline control.  Resting      heart rate 80, can certainly increase this or add an ACE inhibitor.      We will leave this up to Dr. Lovell Sheehan.  4. Coronary artery disease screening.  I talked to the patient a      little bit.  She has definite vascular disease.  She has not had      any stress test in a long time.  She says she wants to get in      better shape before she has a stress test.  I would consider, since      she is otherwise in good health, a followup stress Myoview in the      next year.  5. Cramping in the legs.  She has reasonable pulses in the posterior      tibialis.  However, she will  have ankle-brachial indices when she      has her carotid duplex to assess for lower extremity vascular      disease since she has had carotid disease.   Further recommendation will be based on the results of her carotids and  ABIs.     Noralyn Pick. Eden Emms, MD, [facility]  Electronically Signed    PCN/MedQ  DD: 03/28/2008  DT: 03/29/2008  Job #: [id number]

## 2010-12-16 NOTE — Op Note (Signed)
NAMEJAYLEA, [name]            ACCOUNT NO.:  [id number]   MEDICAL RECORD NO.:  [id number]          PATIENT TYPE:  INP   LOCATION:  5114                         FACILITY:  [facility]   PHYSICIAN:  Wilmon Arms. Corliss Skains, M.D. DATE OF BIRTH:  1928-04-22   DATE OF PROCEDURE:  12/17/2008  DATE OF DISCHARGE:                               OPERATIVE REPORT   PREOPERATIVE DIAGNOSIS:  Loop ileostomy.   POSTOPERATIVE DIAGNOSIS:  Loop ileostomy.   PROCEDURE PERFORMED:  Loop ileostomy closure.   SURGEON:  Wilmon Arms. Corliss Skains, MD   ASSISTANT:  Ollen Gross. Vernell Morgans, MD   ANESTHESIA:  General endotracheal.   INDICATIONS:  This is a loop ileostomy after having a colon resection  and experiencing a leak at her rectal anastomosis.  The patient has done  quite well and recovering from the leak and abscess.  A Gastrografin  enema shows no sign of leak.  The patient has been doing well.  Her  wounds are all healed.  She presents now for closure for ileostomy.   DESCRIPTION OF PROCEDURE:  The patient was brought to the operating room  and placed in supine position on the operating room table.  After an  adequate level of general anesthesia was obtained, a Foley catheter was  placed under sterile technique.  A purse-string suture was placed around  her loop ileostomy with 2-0 silk.  Her abdomen was prepped with Betadine  and draped in sterile fashion.  A time-out was taken to ensure the  proper patient and proper procedure.  We made an elliptical incision  around her old ileostomy.  The dermis was opened with a cautery.  We  bluntly dissected around the bowel in the subcutaneous tissues.  We were  able get into the peritoneal cavity and take down all the adhesions  around the small bowel.  We mobilized the small bowel up into the wound.  We created a side-to-side anastomosis with a GIA-75 stapler.  The old  ileostomy was then removed with another firing of the GIA stapler.  This  simultaneously closed the  enterotomy of the anastomosis.  The mesentery  was taken down with the LigaSure device.  There was virtually no  mesenteric defect.  We put a reinforcing suture of 3-0 silk at the  crotch of the anastomosis.  The anastomosis was palpated and was widely  patent.  We placed the anastomosis back in the peritoneal cavity and  irrigated.  The fascia was cleared in all directions on his anterior  surface with cautery.  The fascia was closed with running #1 PDS.  The  subcutaneous tissues were then irrigated.  Hemostasis was good.  Staples  were used to close the skin.  A dry dressing was applied.  The patient  was then extubated and brought to the recovery room in stable condition.  All sponge, instrument, and needle counts were correct.     Wilmon Arms. Tsuei, M.D.  Electronically Signed    MKT/MEDQ  D:  12/17/2008  T:  12/18/2008  Job:  [id number]

## 2010-12-16 NOTE — Op Note (Signed)
[name], [name] NO.:  [id number]   MEDICAL RECORD NO.:  [id number]          PATIENT TYPE:  INP   LOCATION:  2116                         FACILITY:  [facility]   PHYSICIAN:  Wilmon Arms. Corliss Skains, M.D. DATE OF BIRTH:  09-15-1927   DATE OF PROCEDURE:  09/22/2008  DATE OF DISCHARGE:                               OPERATIVE REPORT   POSTOPERATIVE DIAGNOSES:  1. Pelvic abscess.  2. Rectal anastomotic leak.   POSTOPERATIVE DIAGNOSES:  1. Pelvic abscess.  2. Rectal anastomotic leak.   PROCEDURE PERFORMED:  1. Exploratory laparotomy.  2. Drainage of pelvic abscess.  3. Diverting loop ileostomy.   SURGEON:  Wilmon Arms. Corliss Skains, MD   ASSISTANT:  Lorne Skeens. Hoxworth, MD   ANESTHESIA:  General endotracheal.   INDICATIONS:  The patient is a 77 year old female who is status post a  sigmoid colectomy on September 13, 2008, for recurrent diverticulitis.  The patient recovered nicely from her surgery and was discharged home on  September 19, 2008.  At that time, she was afebrile, had a normal white  count and was having bowel movements with minimal abdominal pain.  However, after being home for a couple of days, she began having some  low-grade fever and worsening abdominal pain.  She also had waxing and  waning mental status.  She became febrile up to 102.9 and the pain  became much worse.  She came back to the emergency department where she  was evaluated.  Her white count is normal and her electrolytes are just  slightly abnormal as the patient has had poor p.o. intake.  She  underwent a CT scan of the abdomen and pelvis which showed a pelvic  abscess, some free air, and what appeared to be anastomotic leak.  Based  on these findings and the patient's worsening clinical status, the  decision was made to proceed directly to the operating room for  exploratory laparotomy.   DESCRIPTION OF PROCEDURE:  The patient was brought to the operating room  and placed in the supine  position on the operating room table.  After an  adequate level of general anesthesia was obtained, the patient's abdomen  was prepped with Betadine and draped in a sterile fashion.  A time-out  was taken to assure the proper patient and proper procedure.  We removed  all the staples from her incisions.  We opened the lower midline  incision.  There was no purulence in the subcutaneous tissues.  The  fascial suture was grasped with Kelly clamp and was cut.  We removed the  entire suture.  We were able to bluntly dissected through the fascia.  We were able to bluntly dissect the adhesions away from the anterior  abdominal wall.  As we dissected down towards the pelvis, we encountered  a large amount of purulent fluid.  This was cultured.  We suctioned out  the purulence.  Some of this was fairly foul smelling.  We then bluntly  mobilized all the small bowel out of the pelvis.  There was a lot of  fibrinous exudate on the surface of the bowel.  We  identified the cecum  and identified our appendectomy site.  There was no sign of inflammation  or leak year.  We then ran the small bowel in a retrograde fashion  beginning at terminal ileum.  The small bowel overall appeared fairly  healthy, but there was some secondary inflammation of the small bowel  that had been lying down in the pelvis.  We ran the small bowel in a  retrograde fashion to the ligament of Treitz.  This was then packed in  upper part of the abdomen.  We then examined the pelvis.  The descending  colon appeared to be healthy, heading down to anastomosis.  However,  when we identified the anastomosis, there was a lot of inflammation in  this area.  We identified a very small leak in the anterior part of the  anastomosis.  This was draining some feculent material.  I attempted to  place a stitch to close this area, but the tissues were too friable.  We  then brought a 19 Blake drains x2, 1 in each side of the pelvis.  We  placed  each drain just anterior to the anastomosis in the abscess.  We  again examined the remainder of the colon and small bowel and all  appeared normal.  We then thoroughly irrigated the abdomen with 5 liters  of normal saline.  We selected a segment of the terminal ileum that  appeared fairly mobile.  I excised a round piece of skin in the right  lower quadrant and removed the subcutaneous fat down to the fascia.  The  fascia was opened in a cruciate fashion.  We then exteriorized the loop  ileostomy.  A 16-French red rubber catheter was placed underneath the  loop.  The catheter was secured to the skin with a 2-0 nylon suture.  The drains were also secured with 3-0 nylon sutures.  After we  exteriorized her loop ileostomy, the fascia was reapproximated with  double-stranded #1 PDS sutures.  We placed intermittent #1 Novofil  sutures to act as internal retentions sutures.  The subcutaneous tissues  were then thoroughly irrigated and staples were loosely placed along the  midline.  The midline incision was then isolated with a towel and to  open the ileostomy.  The ileostomy was matured with interrupted 3-0  Vicryl sutures.  An ostomy appliance was cut to fit and was placed on  the patient's abdomen.  The balls were then placed to bulb suction.  The  patient was then moved to her ICU bed and was transported back to  Intensive Care Unit while intubated.  All sponge, instrument, and needle  counts were correct.      Wilmon Arms. Tsuei, M.D.  Electronically Signed     MKT/MEDQ  D:  09/22/2008  T:  09/22/2008  Job:  [id number]

## 2010-12-16 NOTE — Discharge Summary (Signed)
[name], [name]            ACCOUNT NO.:  [id number]   MEDICAL RECORD NO.:  [id number]          PATIENT TYPE:  INP   LOCATION:  5504                         FACILITY:  [facility]   PHYSICIAN:  Raenette Rover. Felicity Coyer, MDDATE OF BIRTH:  [birth year]   DATE OF ADMISSION:  07/10/2008  DATE OF DISCHARGE:  07/12/2008                               DISCHARGE SUMMARY   PRIMARY CARE PHYSICIAN:  Stacie Glaze, MD   DISCHARGE DIAGNOSES:  1. Severe diverticulosis with questionable abscess.  2. Anemia.  3. Abnormal chest CT at time of admission.   HISTORY OF PRESENT ILLNESS:  [name] is a 77 year old white female  with past medical history of COPD, diverticulosis, and peptic ulcer  disease.  The patient presented to primary care physician's office on  the day of admission with reports of several-week history of left lower  quadrant pain and obstipation with an overall feeling of unwellness.  The patient denied any fever.   Physical exam in the office revealed left lower quadrant tenderness with  no rebound or guarding, however, given persistence of the patient's  symptoms, the patient was sent for CT of the abdomen and pelvis from  primary care physician's office.   PAST MEDICAL HISTORY:  1. GERD.  2. Osteoarthritis.  3. COPD.  4. History of CVA and TIAs.  5. Diverticulosis.  6. Peptic ulcer disease.  7. Status post bilateral carotid endarterectomy.  8. Total knee replacement bilaterally.  9. Tubal ligation.   COURSE OF HOSPITALIZATION:  1. Left lower quadrant pain with severe diverticulosis and      questionable abscess per CT scan.  The patient seen in consultation      by Dr. Manus Rudd, General Surgery.  At this time, no emergent      need for surgery felt necessary.  The patient to be treated with      total of 14 days of Cipro and Flagyl and follow up in 3 weeks with      Dr. Corliss Skains to arrange an elective colectomy to be done as an      outpatient.  The patient has  remained afebrile with normal white      cell count and no abdominal pain throughout hospitalization.  The      patient is to continue low-residue diet, at time of discharge.  Low-      residue diet instructions given by nutritionist during      hospitalization.  2. Mild anemia.  The patient with mild drop in hemoglobin from 10.5-      10.4 with no clinical evidence of bleeding on exam.  Outpatient      follow up per primary care physician.  3. Abnormal chest CT.  Pulmonary nodules noted on the patient's chest      CT at time of admission, radiologist has recommended outpatient      follow up in 3 months with repeat scan to be arranged by primary      care physician.   MEDICATIONS AT TIME OF DISCHARGE:  1. Cipro 500 mg p.o. b.i.d.  2. Flagyl 500 mg p.o. t.i.d.  3. Zocor 40 mg p.o. daily.  4. Aggrenox 25-200 p.o. b.i.d.  5. Bystolic 5 mg p.o. daily.  6. Amitiza 24 mcg p.o. daily.   DISPOSITION:  The patient felt medically stable for discharge home at  this time again and she is to continue oral antibiotic x4 3 days and  follow up with Dr. Corliss Skains in 3 weeks.  The patient is asked to call the  office to arrange this appointment.      Cordelia Pen, NP      Raenette Rover. Felicity Coyer, MD  Electronically Signed    LE/MEDQ  D:  07/12/2008  T:  07/13/2008  Job:  [id number]   cc:   Stacie Glaze, MD  Wilmon Arms. Tsuei, M.D.

## 2010-12-16 NOTE — Consult Note (Signed)
[name], [name] NO.:  [id number]   MEDICAL RECORD NO.:  [id number]          PATIENT TYPE:  INP   LOCATION:  5504                         FACILITY:  [facility]   PHYSICIAN:  Velora Heckler, MD      DATE OF BIRTH:  10/01/1927   DATE OF CONSULTATION:  07/10/2008  DATE OF DISCHARGE:                                 CONSULTATION   REFERRING PHYSICIAN:  Stacie Glaze, MD   CHIEF COMPLAINT:  Abdominal pain, change in bowel habits.   HISTORY OF PRESENT ILLNESS:  [name] is a 77 year old white  female from [place], [place], admitted to the Medical Service  on July 10, 2008, with abdominal pain, fever, chills, sweats, and  change in bowel habits.  This process has been going on for  approximately 3 months.  The patient has been evaluated by Dr. Darryll Capers.  Laboratory studies done on July 06, 2008, showed a normal  white blood cell count.  However, due to her symptoms, a CT scan of the  chest, abdomen, and pelvis was obtained today at [facility].  CT  demonstrated findings of advanced diverticulosis with acute inflammatory  changes around the sigmoid colon.  There appears to be a fistulous tract  with a chronic abscess in the left pelvis.  The patient was admitted to  the Medical Service, and General Surgery consultation was requested.   PAST MEDICAL HISTORY:  1. History of gastroesophageal reflux.  2. History of COPD.  3. History of osteoarthritis.  4. History of cerebrovascular accident and TIAs.  5. History of diverticulosis.  6. History of peptic ulcer disease.  7. Status post tubal ligation.  8. Status post tonsillectomy.  9. Status post bilateral carotid endarterectomies.  10.Status post bilateral total knee replacements.  11.History of laryngeal carcinoma, treated with external beam      radiation therapy.   MEDICATIONS:  Zocor, Aggrenox, Bystolic, vitamin D, Amitiza, and MiraLax  powder, which has been discontinued.   ALLERGIES:  DARVON.   SOCIAL HISTORY:  The patient is married, accompanied by her husband and  children.  She is a former smoker.  She does not use alcohol.   FAMILY HISTORY:  Notable for hypertension and stroke in multiple family  members.   REVIEW OF SYSTEMS:  15-system review documented in the medical record  without significant other findings except as noted above.   PHYSICAL EXAMINATION:  GENERAL:  A 77 year old bright, alert white  female, in no acute distress.  VITAL SIGNS:  Temperature 97.8, pulse 87, respirations 20, blood  pressure 128/81, and O2 saturation 97% room air.  HEENT:  Normocephalic and atraumatic.  Sclerae clear.  Conjunctiva  clear.  Dentition fair.  Mucous membranes moist.  Voice normal.  NECK:  Anterior examination of the neck shows well-healed surgical  wounds.  Palpation shows no thyroid nodularity.  No lymphadenopathy.  No  tenderness.  No mass.  LUNGS:  Clear to auscultation bilaterally without rales, rhonchi, or  wheeze.  CARDIAC:  Regular rate and rhythm without murmur.  Peripheral pulses are  full.  EXTREMITIES:  Nontender without edema.  ABDOMEN:  Soft without distention.  Active bowel sounds are present.  No  tenderness.  No palpable mass.  No hepatosplenomegaly.  Well-healed  surgical wound transversely just below the umbilicus, well-healed  Pfannenstiel incision.  EXTREMITIES:  Nontender.  NEUROLOGIC:  The patient is alert and oriented without focal deficit.   LABORATORY STUDIES:  Dated July 06, 2008, from [facility]  shows a white count 9.7, hemoglobin 11.2, hematocrit 33.1%, and platelet  count 407,000.  Differential is normal.  Electrolytes were normal.  Kidney function shows a creatinine of 0.8 with a BUN of 11.  CEA level  was normal at 0.9.  TSH level was normal at 0.83.   RADIOGRAPHIC STUDIES:  CT scan of abdomen, pelvis, and chest performed  today at [facility] includes chest showing bilateral   pleural parenchymal opacity likely representing scarring.  Followup CT  scan in 3 months is requested.  Abdominal CT shows a 1-cm hypodense  lesion in the liver of undetermined significance.  CT scan of the pelvis  shows advanced diverticulosis of the sigmoid colon with wall thickening  and pericolonic edema and inflammation.  There appears to be a fistulous  tract to a chronic abscess.   IMPRESSION:  1. Advanced diverticulosis.  2. Acute diverticulitis.  3. 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   PLAN:  The patient is admitted on the Medical Service.  She will be  started on intravenous antibiotics.  At this point, I would allow clear  liquid diet at the patient's request.  The patient will require surgical  resection, although the timing of that procedure will be determined by  Dr. Manus Rudd.  The patient is in no acute distress and we do have  the luxury of being allowed to do a bowel prep in preparation for  surgery.   Issues noted above were discussed with the family at length.  They  understand.  No role for emergent surgical intervention at this point in  time.      Velora Heckler, MD  Electronically Signed     TMG/MEDQ  D:  07/10/2008  T:  07/11/2008  Job:  [id number]

## 2010-12-16 NOTE — Discharge Summary (Signed)
NAMESAMI, ROES            ACCOUNT NO.:  [id number]   MEDICAL RECORD NO.:  [id number]          PATIENT TYPE:  INP   LOCATION:  1342                         FACILITY:  [facility]   PHYSICIAN:  Rosalyn Gess. Norins, MD  DATE OF BIRTH:  1927-12-11   DATE OF ADMISSION:  10/12/2008  DATE OF DISCHARGE:  10/17/2008                               DISCHARGE SUMMARY   ADMISSION DIAGNOSES:  1. Diverticulitis of the colon with hemorrhage.  2. Hypoxemia.  3. Chronic obstructive pulmonary disease.  4. Hypertension.   DISCHARGE DIAGNOSES:  1. Diverticulitis after resection of colon with retroperitoneal      abscess.  2. Hypoxemia, resolved.  3. Chronic obstructive pulmonary disease, stable.  4. Hypertension, stable.   CONSULTANT:  Wilmon Arms. Corliss Skains, M.D. for General Surgery.   PROCEDURE:  1. CT of the abdomen with contrast performed on October 12, 2008, day of      admission which revealed the patient to have a 3 x 4 cm rim      enhancement, left retroperitoneal fluid collection mildly decreased      in size from her prior exam.  Abscess could not be excluded.      Cholelithiasis is an incidental finding.  2. CT of the pelvis same date which showed near complete resolution of      pelvic fluid collection since prior exam.  No residual drainage or      fluid collections identified within the pelvis.  3. CT angio of chest to rule out PE.  There was no evidence of      thoracic aneurysm or dissection.  There was no evidence of acute      pulmonary embolism.  No mediastinal or hilar masses were      identified.  No evidence of lymphadenopathy was noted.   HISTORY OF PRESENT ILLNESS:  The patient is a 77 year old woman followed  by Dr. Darryll Capers.  Three weeks prior to admission, she had a  colectomy for diverticulitis.  Since time of discharge from hospital,  she had increasing weakness, weight loss and on the day of admission had  significant shortness of breath and hypotension.  She had a  near  syncopal episode in the office and dropped her O2 sats into the low 80s.  Exam revealed she had atrial fibrillation.  Sats were increased to 98%  with 3 liters of oxygen.  Evidently atrial fibrillation is a new  diagnosis for this patient.  Because of her symptoms, she was admitted  to the hospital for further evaluation to the step-down/ICU unit.   HOSPITAL COURSE:  1. Near syncope.  The patient had a negative evaluation.  She ruled      out for PE.  She had no further near syncopal episodes.  She did      have some mild tachycardia with activity.  2. Generalized weakness.  The patient did improve.  She was able to      work with physical therapy on the day of discharge.  She already      had been receiving home health physical therapy.  3. Dyspnea.  The patient had no effusion or abnormality on CT angio.      Her shortness of breath seemed to resolve.  She had no complaints      of shortness of breath either the night of October 16, 2008 or today      at the time of discharge.  4. Status post colon resection.  The patient was seen by the wound      care ostomy team.  Her colostomy was functioning well.  She was      passing large amounts of fluid.  It was felt a bulking agent might      be helpful for her and she will be asked to take Metamucil or a      similar product at time of discharge.  Dr. Corliss Skains did see the      patient daily during her hospitalization and felt her incision      looked well with good granulation tissue and the colostomy looked      well as well.  5. Retroperitoneal abscess.  Patient with an enhancing mass as noted.      The patient was started on IV antibiotics with Zosyn.  She did well      on this regimen.  Dr. Corliss Skains felt that she could be converted to      oral antibiotics and Augmentin was started on October 15, 2008.  On      this regimen, the patient continued to do well.  She needs to      continue antibiotics for at least 2 weeks.  She will be seeing  Dr.      Corliss Skains in followup in this regard.  We will defer to Dr. Corliss Skains in      regards to follow up CT of the abdomen.  6. Chronic obstructive pulmonary disease.  The patient was doing well      at the time of discharge dictation with no increased work of      breathing.  7. Failure to thrive.  The patient was making a slow recovery from her      surgery and hospitalization, but does seem to be making good      progress.  We will continue with home health OT and PT.  8. Gastrointestinal:  The patient did have some significant discomfort      in the esophagus and epigastric area.  She was started on liquid      antacid and a proton pump inhibitor on October 16, 2008.  We will      have her continue this at home.  9. Cardiovascular:  The patient was mildly tachycardic with exertion.      On examination at discharge, she did have a sinus rhythm although      rapid.   With the patient being medically stable with being afebrile with her  dyspnea having resolved, with her having no further near syncopal  episodes, with her being able to work with physical therapy, she at this  point is felt to be stable and ready for discharge home.   DISCHARGE PHYSICAL EXAMINATION:  VITAL SIGNS:  Temperature 98.8, blood  pressure 118/81, pulse 113, respirations were 20, O2 sat is 94% on room  air.  GENERAL APPEARANCE:  This is a pleasant woman looking younger than her  stated chronologic age sitting in chair in no acute distress.  HEENT:  Grossly normal.  CHEST:  Patient is moving air well.  I did not appreciate  any rales,  wheezes, rhonchi.  There was no increased work of breathing or use of  accessory muscles of respiration.  CARDIOVASCULAR:  The patient had 2+  radial pulse.  Her heart sounds were distant, but on careful  auscultation, she had a regular tachycardia with no PVCs and definitely  no atrial fibrillation.  ABDOMEN:  The patient had positive bowel sounds in all 4 quadrant.  Her  colostomy  was not examined.  She had no tenderness to palpation.  NEUROLOGIC:  The patient is awake, alert, oriented to person, place,  time and context.  Speech is fluent.  Cognition seems to be at baseline.  Her neurologic exam was grossly unremarkable with no abnormalities.   FINAL LABORATORY DATA:  Urine culture grew 30,000 colonies of yeast.  Stool for occult blood was negative.  Final metabolic panel from October 15, 2008 with a sodium 135, potassium 3.7, chloride 109, CO2 of 20, BUN  of 7, creatinine 1.12, glucose was 111.  CBC from October 15, 2008 with a  hemoglobin of 11.8 gm, white count 7900 with a normal differential.  Platelet count is 236,000.  The patient had a random cortisol level on  October 13, 2008, it was normal at 9.7.  Vitamin B12 was checked and was  normal at 407.  The patient had urinalysis on October 13, 2008 which was  negative.  ESR was checked on October 13, 2008 which was slightly elevated  at 39 consistent with mild nonspecific inflammation.  Lipase at  admission was 65.  BNP on October 13, 2008 was normal at 59.7.  TSH on  October 12, 2008 was normal at 1.56.   DISPOSITION:  The patient is discharged home.  She will resume home  health physical therapy.   DISCHARGE MEDICATIONS:  1. Continue with magic mouthwash swish and spit p.r.n.  2. Spiriva 18 mcg 1 inhalation daily.  3. Zocor 40 mg daily.  4. Aggrenox may be resumed 1 tablet b.i.d.  5. Bystolic 5 mg daily.  6. Vitamin D 50,000 units by mouth weekly with food.  7. Augmentin 875 mg b.i.d. for an additional 10 days.  8. New medications will include Protonix 40 mg daily.  9. Liquid antacid of choice to be taken on a p.r.n. basis.   FOLLOW UP:  1. The patient will see Dr. Darryll Capers in 7-14 days.  2. She will see Dr. Marcille Blanco as instructed.  He left a message for      her to call the office for an appointment to see him  in 1 week.   CONDITION ON DISCHARGE:  Stable and improved.      Rosalyn Gess Norins, MD   Electronically Signed     MEN/MEDQ  D:  10/17/2008  T:  10/17/2008  Job:  [id number]   cc:   Wilmon Arms. Corliss Skains, M.D.  [address] [place] [address]  [facility]   Stacie Glaze, MD  [address] [place]  [place] [address]

## 2010-12-16 NOTE — Discharge Summary (Signed)
[name], [name]            ACCOUNT NO.:  [id number]   MEDICAL RECORD NO.:  [id number]          PATIENT TYPE:  INP   LOCATION:  5114                         FACILITY:  [facility]   PHYSICIAN:  Wilmon Arms. Corliss Skains, M.D. DATE OF BIRTH:  Jun 26, 1928   DATE OF ADMISSION:  12/17/2008  DATE OF DISCHARGE:  12/22/2008                               DISCHARGE SUMMARY   ADMISSION DIAGNOSES:  1. Loop ileostomy, status post colon resection with anastomotic leak.  2. Atrial fibrillation.   DISCHARGE DIAGNOSES:  1. Loop ileostomy, status post colon resection with anastomotic leak.  2. Atrial fibrillation.   PROCEDURE PERFORMED:  An ileostomy reversal.   BRIEF HISTORY:  This is a 77 year old female, who has a loop ileostomy  after having a colon resection for severe diverticulitis.  She had a  leak at her rectal anastomosis and had pelvic abscess, which was drained  at reexploration.  We created a diverting loop ileostomy.  Since that  time, her anastomotic leakage healed as documented with Gastrografin  enema.  She presents now for ileostomy reversal.  She is chronically  anticoagulated and was on Lovenox prior to her surgery.   HOSPITAL COURSE:  The patient underwent a loop ileostomy reversal on May  17.  Her procedure went quite well and we created a side-to-side staple  anastomosis.  Postoperatively, the patient was placed back on her  Coumadin as well as therapeutic Lovenox.  Her INR has been very slow to  rise.  She regained bowel function on postop day #3 and began having  copious diarrhea.  Clostridium difficile toxin was negative.  She did  began having more formed bowel movements on the day of discharge.  Her  diarrhea has drastically decreased.  She had minimal pain and has  managed her pain well with p.r.n. Vicodin.  She has been advanced to a  regular diet.  She has been ambulating in the halls.  On the date of  discharge, her wound is clean with no erythema and just a tiny bit of  serous drainage from one staple hole.   DISCHARGE INSTRUCTIONS:  She is to follow up on Monday with Dr. Lovell Sheehan  to check her Coumadin level.  She is given prescriptions for Vicodin  p.r.n. for pain, Coumadin 5 mg daily and Lovenox 80 mg subcutaneous  daily.  She is to follow up with Dr. Corliss Skains at the end of next week for  staple removal.  She is to refrain from heavy lifting.  She may take  showers.  She should walk with some assistance.      Wilmon Arms. Tsuei, M.D.  Electronically Signed    MKT/MEDQ  D:  12/22/2008  T:  12/22/2008  Job:  [id number]   cc:   Stacie Glaze, MD

## 2010-12-16 NOTE — Op Note (Signed)
[name], [name]            ACCOUNT NO.:  [id number]   MEDICAL RECORD NO.:  [id number]          PATIENT TYPE:  INP   LOCATION:  Z610                         FACILITY:  [facility]   PHYSICIAN:  Ollen Gross, M.D.    DATE OF BIRTH:  04/07/1928   DATE OF PROCEDURE:  06/15/2007  DATE OF DISCHARGE:                               OPERATIVE REPORT   PREOPERATIVE DIAGNOSES:  Osteoarthritis right knee.   POSTOPERATIVE DIAGNOSES:  Osteoarthritis right knee.   PROCEDURE:  Right total knee arthroplasty.   SURGEON:  Dr. Lequita Halt.   ASSISTANT:  Nurse.   ANESTHESIA:  Spinal.   ESTIMATED BLOOD LOSS:  Minimal.   DRAIN:  None.   TOURNIQUET TIME:  34 minutes at 300 mmHg.   COMPLICATIONS:  None.   CONDITION:  Stable to recovery.   CLINICAL NOTE:  [name] is a 77 year old female with end-stage  arthritis in the right knee with progressively worsening pain and  dysfunction.  She has had a previous successful left total knee  arthroplasty and presents now for right total knee arthroplasty.   DESCRIPTION OF PROCEDURE:  After successful administration of spinal  anesthetic with Duramorph,  a tourniquet is placed high on her right  thigh and right lower extremity prepped and draped in the usual sterile  fashion. Extremities wrapped in Esmarch, knee flexed and tourniquet  inflated to 300 mmHg. A midline incision was made with a 10 blade  through the subcutaneous tissues to the level of the extensor mechanism.  A fresh blade was used to make a medial parapatellar arthrotomy.  The  soft tissue over the proximal medial tibia was subperiosteally elevated  to the joint line with a knife and into the semimembranosus bursa with a  Cobb elevator.  The soft tissue laterally is elevated with attention  being paid to avoiding the patellar tendon on the tibial tubercle.  The  patella was subluxed laterally, knee flexed 90 degrees, ACL and PCL  removed.  A drill was used to create a starting hole in  the distal femur  and canal was thoroughly irrigated.  A 5 degree right valgus alignment  guide is placed and referencing off the posterior condyles rotation is  marked and a block pinned to remove 11 mm off the distal femur.  I took  11 because of a preop flexion contracture.  Distal femoral resection is  made with an oscillating saw.  A sizing block is placed, size 3 is the  most appropriate.  Rotation is marked off the epicondylar axis. A size 3  cutting block is placed and the anterior, posterior and chamfer cuts  made.   The tibia is subluxed forward and the menisci are removed.  The  extramedullary tibial alignment guide is placed referencing proximally  at the medial aspect of the tibial tubercle and distally along the  second metatarsal axis and tibial crest.  The block is pinned to remove  10 mm off the non deficient lateral side.  Tibial resection is made with  an oscillating saw.  A size 3 is the most appropriate tibial component  and the proximal tibia  is prepared with a modular drill and keel punch  for a size 3.  Femoral preparation is completed with the intercondylar  cut.   A size 3 mobile bearing tibial trial and size 3 posterior stabilized  femoral trial and a 10 mm posterior stabilized rotating platform insert  trial are placed.  With the 10, she hyperextended a tiny bit and has a  little bit of varus-valgus play so with the 12.5 which allowed for full  extension with excellent varus and valgus and anterior-posterior balance  throughout full range of motion.  The patella was then everted,  thickness measured to be 22 mm.  Freehand resection taken to 13 mm, 38  template is placed, lug holes are drilled, trial patella is placed and  it tracks normally. The osteophytes are removed off the posterior femur  with the trial in place.  All trials are removed and the cut bone  surfaces prepared with pulsatile lavage.  The cement is mixed and once  ready for implantation, a  size three mobile bearing tibial tray, size  three posterior stabilized femur and 38 patella are cemented into place  and the patella is held with a clamp.  A trial 12.5 insert is placed,  knee held in full extension and all extruded cement removed.  Once the  cement is fully hardened then the wound is copiously irrigated with  saline solution and the posterior capsule injected with FloSeal.  The  permanent 12.5 mm posterior stabilized rotating platform insert is  placed into the tibial tray.  The wound was copiously irrigated with  saline solution and the FloSeal further injected onto the medial and  lateral gutters and suprapatellar area.  The tourniquet is released with  a total time of 34 minutes.  A moist sponge is held for 2 minutes and  then removed.  There is minimal bleeding encountered.  Any bleeding that  is encountered is stopped with electrocautery.  We irrigated again and  the extensor mechanism was then closed with interrupted #1 PDS.  Flexion  against gravity was 140 degrees.  The subcu is closed with interrupted 2-  0 Vicryl,  subcuticular with running 4-0 Monocryl.  The incision is  cleaned and dried and Steri-Strips and a bulky sterile dressing applied.  She is then placed into a knee immobilizer, awakened and transported to  recovery in stable condition.      Ollen Gross, M.D.  Electronically Signed     FA/MEDQ  D:  06/15/2007  T:  06/16/2007  Job:  [id number]

## 2010-12-16 NOTE — H&P (Signed)
[name], [name]            ACCOUNT NO.:  [id number]   MEDICAL RECORD NO.:  [id number]          PATIENT TYPE:  INP   LOCATION:  NA                           FACILITY:  [facility]   PHYSICIAN:  Ollen Gross, M.D.    DATE OF BIRTH:  March 12, 1928   DATE OF ADMISSION:  06/15/2007  DATE OF DISCHARGE:                              HISTORY & PHYSICAL   CHIEF COMPLAINT:  Right knee pain.   HISTORY OF PRESENT ILLNESS:  The patient is a 77 year old female with  worsening right knee pain and antalgic gait that has been refractory to  conservative treatment.  Patient has elected to have a right total knee  arthroplasty by Dr. Ollen Gross.   PAST MEDICAL HISTORY:  1. Significant for hypertension.  2. Anxiety.  3. 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   FAMILY MEDICAL HISTORY:  Significant for coronary artery disease and  cancer.   SOCIAL HISTORY:  The patient is married.  Does not smoke.   PRIMARY CARE PHYSICIAN:  Patient of Dr. Lovell Sheehan.   ALLERGIES:  DEMEROL.   MEDICATIONS:  Nadolol and simvastatin.  Dosages unknown, please check  the patient's home records.   REVIEW OF SYSTEMS:  Negative except for pain with ambulation.   PHYSICAL EXAMINATION:  VITAL SIGNS:  Pulse 80, respirations 18, blood  pressure 122/68.  GENERAL:  The patient is a healthy-appearing 77 year old female in no  acute distress.  Pleasant mood and affect.  Alert and oriented x three.  NECK:  Shows full range of motion without any tenderness.  NEUROLOGIC:  Cranial nerves II-XII are grossly intact.  CHEST:  Active breath sounds bilaterally.  No wheezes, rhonchi or rales.  HEART:  Shows regular rate and rhythm.  She does have a 3/6 holosystolic  murmur.  ABDOMEN:  Nontender and nondistended.  EXTREMITIES:  Moderate tenderness to the right medial joint line and  moderate crepitus.  She does have an antalgic gait.  Capillary fill is  less than 2 seconds.  NEUROVASCULAR:  She is intact to  bilateral upper and lower extremities.  SKIN:  Shows no rashes or edema, although she does have some decreased  sensation to the right distal hand along the ulnar distribution.   DIAGNOSTICS:  X-rays show end-stage osteoarthritis of the right knee.   IMPRESSION:  End-stage osteoarthritis of the right knee.   PLAN OF ACTION:  To have a right total knee arthroplasty by Dr. Ollen Gross.      Thomas B. Durwin Nora, P.A.      Ollen Gross, M.D.  Electronically Signed    TBD/MEDQ  D:  06/14/2007  T:  06/14/2007  Job:  [id number]

## 2010-12-16 NOTE — Discharge Summary (Signed)
[name], [name]            ACCOUNT NO.:  [id number]   MEDICAL RECORD NO.:  [id number]          PATIENT TYPE:  INP   LOCATION:  1524                         FACILITY:  [facility]   PHYSICIAN:  Wilmon Arms. Corliss Skains, M.D. DATE OF BIRTH:  May 06, 1928   DATE OF ADMISSION:  09/13/2008  DATE OF DISCHARGE:  09/19/2008                               DISCHARGE SUMMARY   PREOPERATIVE DIAGNOSIS:  Recurrent sigmoid diverticulitis.   DISCHARGE DIAGNOSIS:  Recurrent sigmoid diverticulitis with chronic  pelvic abscess.   PROCEDURE PERFORMED:  Laparoscopic-assisted converted to an open sigmoid  colectomy, rigid sigmoidoscopy and incidental appendectomy.   BRIEF HISTORY:  This is a 77 year old female who has had about a year of  symptoms of sigmoid diverticulitis.  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  Her symptoms resolved after receiving  intravenous antibiotics and she was discharged home with plans for  elective surgery.  Currently she is asymptomatic and is no longer on her  oral antibiotics.  She continues to have bowel movements.   HOSPITAL COURSE:  The patient was admitted to the hospital on September 13, 2008 after undergoing a bowel prep at home.  She underwent sigmoid  colectomy.  This had to be converted to open procedure due to the large  amount of adhesions and inflammation in her pelvis.  The patient's  sigmoid colon was fairly redundant and loops off to the right.  However,  she had developed a chronic fistula between the upper sigmoid colon and  the top of the rectum.  We had to resect this entire area down to what  felt like normal rectum.  She underwent an end-to-end stapled EEA  anastomosis.  She also has had an incidental appendectomy.  Postoperatively, the patient did reasonably well.  Her pain was  controlled with PCA pump.  She began having some flatus on postop day #3  and was started on clear liquids.  She began having some bowel movements  on postop day #4 and her diet was slowly advanced.  She was restarted on  her Aggrenox and was transitioned off of her subcu heparin.  Her wound  was clean.  She was discharged home on postop day number 6.   DISCHARGE INSTRUCTIONS:  The patient is given Vicodin p.r.n. for pain  although she was not really taking much in the way of pain medication.  The patient may shower.  Follow-up in 1 week for staple removal.  Her  pathology showed no sign of malignancy.  This has been discussed with  the patient.      Wilmon Arms. Tsuei, M.D.  Electronically Signed    MKT/MEDQ  D:  10/15/2008  T:  10/15/2008  Job:  [id number]   cc:   Stacie Glaze, MD  [address] [place]  [place] [address]

## 2010-12-16 NOTE — Op Note (Signed)
NAMEJETTY, BERLAND            ACCOUNT NO.:  [id number]   MEDICAL RECORD NO.:  [id number]          PATIENT TYPE:  INP   LOCATION:  0002                         FACILITY:  [facility]   PHYSICIAN:  Wilmon Arms. Corliss Skains, M.D. DATE OF BIRTH:  Feb 10, 1928   DATE OF PROCEDURE:  09/13/2008  DATE OF DISCHARGE:                               OPERATIVE REPORT   PREOPERATIVE DIAGNOSIS:  Recurrent sigmoid diverticulitis.   POSTOPERATIVE DIAGNOSIS:  Recurrent sigmoid diverticulitis.   PROCEDURE PERFORMED:  1. Laparoscopic assisted converted to open sigmoid colectomy.  2. Rigid sigmoidoscopy.  3. Incidental appendectomy.   SURGEON:  Wilmon Arms. Corliss Skains, M.D.   ASSISTANT:  Anselm Pancoast. Zachery Dakins, M.D.   ANESTHESIA:  General endotracheal.   INDICATIONS:  The patient is a 77 year old female who was initially  admitted to the hospital in December 2009.  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  She has had symptoms of diverticulitis for about a year.  She  responded quickly with antibiotics, and she was discharged home with  plans for an elective surgery.  It has been about 2 months since that  admission, and she has been asymptomatic.  She does have frequent  diarrhea.  She denies any abdominal pain.  She presents now for sigmoid  colectomy.   DESCRIPTION OF PROCEDURE:  The patient was brought to the operating room  and placed in a supine position on the operating table.  After an  adequate level of general anesthesia was obtained, the patient's legs  were placed in lithotomy position, and a Foley catheter was placed.  Her  abdomen and perineum were prepped with Betadine and draped in sterile  fashion.  Time out was taken to assure the proper patient, proper  procedure.  An 11-mm OptiView port was used to cannulate the peritoneal  cavity and the epigastrium.  Insufflation was then obtained by  insufflating CO2, maintaining a maximal  pressure of 15 mmHg.  The  laparoscope was inserted, and the patient was positioned in  Trendelenburg, tilted to her left.  Two 5-mm ports were placed on the  right side.  The sigmoid colon was identified and was pulled medially.  There seemed be a lot of dense adhesions down in the pelvis to the  posterior surface of the uterus and the left pelvic sidewall.  There was  no gross purulence.  A GelPort was placed in the lower midline through a  7-cm incision.  A 5-mm port was placed in the left lower quadrant.  We  then mobilized the ascending colon by dividing the white line of Toldt  with cautery.  The colon was mobilized medially and we cauterized  laterally.  We continued up around the splenic flexure.  The spleen was  visualized.  We divided some of these attachments to allow the colon to  come down.  We then turned our attention distally.  The adhesions were  extremely dense down in the pelvis.  There was a large, thick,  inflammatory mass in the pelvis.  There was no gross purulence.  It  became fairly obvious that we would not be able to complete this safely  with laparoscopic approach.  We removed our trocars and the GelPort and  extended her lower midline incision to an area just above the umbilicus  down to the symphysis pubis.  The Balfour retractor was inserted, and we  packed the small bowel away in the upper abdomen.  We were able to  identify the ureter on the left headed down to the pelvis.  We were well  medial to this.  We then spent some time meticulously trying to dissect  the colon away from the lateral abdominal wall and lateral pelvic  sidewall.  This was very difficult due to the degree of inflammation and  adhesions.  We ended up having to bluntly dissect this away with a pinch  technique.  The colon appeared to have a fairly traumatic S turn towards  the right.  The area of diverticulitis was adherent down to the pelvis,  but the loop of the sigmoid colon heading  off to the right actually felt  pretty soft.  After an hour and half of dissection, we were actually  able to mobilize the colon up into the upper pelvis.  We divided the  sigmoid colon above the area of inflammation with a GIA 75 stapler.  The  mesentery was taken with the LigaSure device.  We then continued  dissecting down into the pelvis.  We noticed a segment of normal-  appearing colon and then an area of thickened colon distally.  It was  felt that this area did not represent diverticulitis but was actually  secondary inflammation from the pelvic abscess.  I went below, and a  rigid proctoscope was inserted.  There was no sign of stricture in this  area.  The mucosa appeared normal.  This was up to about 15 cm above the  anal verge.  Changed gown and gloves and scrubbed back in.  The rectum  was then divided with a Contour stapler green load at 15 cm.  The  remainder of the mesentery was taken with the LigaSure.  The specimen  was oriented with a suture distal and was passed off the field.  We then  thoroughly irrigated the pelvis, and hemostasis was good.  We could  visualize the left ureter, and we were well away from the right ureter.  The posterior vagina and uterus were retracted anteriorly.  We then  turned our attention to the stump of the sigmoid colon.  We bluntly  dissected around the splenic flexure to bring down this segment of colon  and to provide a tension-free anastomosis.  We amputated the staple  line, and EEA sizers were used to determine the size of the colon.  We  selected a 29-mm EEA stapler.  A pursestring suture of 2-0 Prolene was  placed around the distal end of the sigmoid colon, and the anvil was  secured in place.  We cleared some of fat off of the sides of the  pursestring suture.  I then went back below and inserted the 29-mm EEA  stapler per rectum.  I inserted a finger in the vagina to ensure that we  did not impinge the posterior wall of the vagina.   The spike was slowly  advanced through the middle of the staple line and was mated with the  anvil.  We then closed the stapler down and held it for a minute and  then fired it.  The  stapler was then removed, and the pelvis was filled  with irrigation.  I advanced a rigid sigmoidoscope up to the  anastomosis.  The anastomosis appeared to be intact with no bleeding.  We insufflated air, and no air bubbles were noted in the pelvis.  The  stapler was inspected, and we removed the donuts.  I inadvertently tore  the more proximal donut as I was removing it, but it did appear to be  intact inside the stapler.  We again irrigated the pelvis.  Hemostasis  was good.  We noted a very long appendix which was lying directly next  our anastomosis.  The decision was made to perform an incidental  appendectomy.  We divided the mesoappendix with the LigaSure device.  A  2-0 silk pursestring suture was placed around the base of the appendix.  The base of appendix was crushed and then ligated with a 2-0 chromic  tie.  The appendix was amputated, and the stump was inverted and secured  with the pursestring suture.  The pelvis was then again thoroughly  irrigated.  We had a correct sponge, instrument, and needle count.  The  fascia was then reapproximated with #1 PDS double-stranded suture.  The  subcutaneous tissues were irrigated, and staples were used to close all  of the skin incisions.  The patient was then extubated and brought to  recovery room in stable condition.  All sponge, instrument and needle  counts correct.      Wilmon Arms. Tsuei, M.D.  Electronically Signed     MKT/MEDQ  D:  09/13/2008  T:  09/13/2008  Job:  [id number]

## 2010-12-16 NOTE — Discharge Summary (Signed)
[name], [name]            ACCOUNT NO.:  [id number]   MEDICAL RECORD NO.:  [id number]          PATIENT TYPE:  INP   LOCATION:  1405                         FACILITY:  [facility]   PHYSICIAN:  Raenette Rover. Felicity Coyer, MDDATE OF BIRTH:  1928-02-19   DATE OF ADMISSION:  06/09/2008  DATE OF DISCHARGE:  06/12/2008                               DISCHARGE SUMMARY   PRIMARY CARE PHYSICIAN:  Stacie Glaze, MD.   CARDIOLOGIST:  Noralyn Pick. Eden Emms, MD.   DISCHARGE DIAGNOSES:  1. Transient expressive aphasia likely secondary to transient ischemic      attack.  2. Known peripheral vascular disease.  3. Urinary tract infection.   HISTORY OF PRESENT ILLNESS:  [name] is a 77 year old white female  with past medical history of CVA with some residual right-sided  intermittent numbness, hypertension, hyperlipidemia and bilateral  carotid endarterectomies done in [place] a couple of years ago.  The  patient reports being in her usual state of health on day of admission  when she began experiencing problems expressing what she wanted to say.  The patient reported she knew what she wanted to say, but could not  articulate the words.  Symptoms lasted for approximately 30 minutes and  then resolved spontaneously.  The patient presented to the emergency  room for further evaluation and treatment due to known history of  vascular disease.   PAST MEDICAL HISTORY:  1. Hypertension.  2. History of CVA with some residual intermittent numbness.  3. Hyperlipidemia.  4. Bilateral carotid endarterectomies.  5. Laryngeal cancer status post surgery.   HOSPITAL COURSE:  1. Expressive aphasia.  Again, patient with history of CVA.  CT of the      head obtained at time of admission was negative for any acute      findings.  The patient refused MRI during this hospitalization due      to claustrophobia.  Again, the patient is status post bilateral      carotid endarterectomy.  Outpatient carotid studies were  performed      on April 13, 2008 per Dr. Charlton Haws revealing a 60-79% right      ICA stenosis and 40-59% left ICA stenosis.  Carotid studies were      repeated during this hospitalization to determine any progression      of disease.  Pulmonary report shows no progression, however, final      results can be reviewed by primary care physician at follow up      appointment.  In addition, a 2-D echocardiogram obtained during      this admission revealed left ventricular ejection fraction of 60%      with no cardiac source of emboli detected.  At this time due to the      patient's high risk of recurrent CVAs and increased homocystine      level during hospitalization, the patient will be switched from      aspirin to Aggrenox for further stroke prevention.  2. Urinary tract infection.  The patient placed on empiric Rocephin at      time of admission due to urinalysis  revealing less than 20 WBCs.      At this time, she has been transitioned to p.o. Ceftin to complete      a total of 5 days.  Urine culture was not sent during      hospitalization.  If the patient remains symptomatic at follow up      appointment, would repeat urinalysis with urine culture   DISCHARGE MEDICATIONS:  1. Aggrenox 1 capsule p.o. b.i.d., note this is a new medication.  2. Simvastatin 40 mg p.o. q.h.s.  3. Bystolic 5 mg p.o. daily.  4. Dulcolax suppository as needed for constipation.  5. Metamucil Fiber as needed for constipation.  6. Ceftin 250 mg p.o. b.i.d. until gone.   LABORATORY DATA:  At time of discharge:  White cell count 11.7, platelet  count 321, hemoglobin 11.5, hematocrit 35.1, sodium 139, potassium 4.5,  BUN 14, creatinine 1.07, homocystine 19.3, hemoglobin A1c 6.4.  Urinalysis positive for large leukocytes with 11-20 WBCs, 7-10 RBCs.   DISPOSITION:  The patient felt medically stable for discharge home at  this time.  The patient has undergone evaluation by both physical and  occupational  therapy who feel that the patient is independent of  activities and is not requiring any follow up assistance.   FOLLOW UP:  The patient is instructed to follow up with her primary care  physician as previously scheduled on Monday, June 18, 2008.      Lisa Pen, NP      Raenette Rover. Felicity Coyer, MD  Electronically Signed    LE/MEDQ  D:  06/12/2008  T:  06/12/2008  Job:  [id number]   cc:   Stacie Glaze, MD  [address] [place]  [place] [address]   Noralyn Pick Eden Emms, MD, [facility]  [address]  [address]  [place]  [place] [address]

## 2010-12-16 NOTE — H&P (Signed)
[name], GILLHAM            ACCOUNT NO.:  [id number]   MEDICAL RECORD NO.:  [id number]          PATIENT TYPE:  INP   LOCATION:  0002                         FACILITY:  [facility]   PHYSICIAN:  Wilmon Arms. Corliss Skains, M.D. DATE OF BIRTH:  05-08-1928   DATE OF ADMISSION:  09/13/2008  DATE OF DISCHARGE:                              HISTORY & PHYSICAL   CHIEF COMPLAINT:  Recurrent sigmoid diverticulitis.   HISTORY OF PRESENT ILLNESS:  This is a patient of Dr. Darryll Capers.  She  was initially seen in consultation July 10, 2008 by Dr. Darnell Level  at [facility].  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  She had been having symptoms for about a year.  She responded quickly  with antibiotics and she was discharged home with plans for elective  surgery.  She wanted to wait as her husband was also having colon  surgery.  She is asymptomatic and is not on antibiotics.  She does have  frequent small runny bowel movements.  She denies any fever.   MEDICATIONS:  1. Bystolic.  2. Simvastatin.  3. Aggrenox b.i.d.   ALLERGIES:  DARVON.   PAST MEDICAL HISTORY:  1. Hypertension.  2. Hypercholesterolemia.  3. History of stroke.  4. Gastroesophageal reflux disease.  5. COPD.  6. Sigmoid diverticulitis with abscess.   PAST SURGICAL HISTORY:  1. Bilateral carotid endarterectomies.  2. Bilateral tubal ligation.  3. Bilateral knee replacements.   SOCIAL HISTORY:  Nonsmoker, nondrinker.  She does have a history of  smoking in the distant past.   REVIEW OF SYSTEMS:  Please see the office review of systems sheet.   PHYSICAL EXAMINATION:  GENERAL:  This is an elderly female in no  apparent distress.  HEENT:  EOMI.  Sclerae anicteric.  NECK:  No masses, no thyromegaly.  LUNGS:  Clear.  Normal respiratory effort.  HEART:  Regular rate and rhythm.  No murmur.  ABDOMEN:  Positive bowel sounds, soft, nontender.   IMPRESSION:  Recurrent sigmoid diverticulitis with a chronic pelvic  abscess and possible fistula.   PLAN:  Elective laparoscopic-assisted sigmoid colectomy.      Wilmon Arms. Tsuei, M.D.  Electronically Signed     MKT/MEDQ  D:  09/13/2008  T:  09/13/2008  Job:  [id number]

## 2010-12-16 NOTE — Discharge Summary (Signed)
[name], [name]            ACCOUNT NO.:  [id number]   MEDICAL RECORD NO.:  [id number]          PATIENT TYPE:  INP   LOCATION:  1406                         FACILITY:  [facility]   PHYSICIAN:  Rosalyn Gess. Norins, MD  DATE OF BIRTH:  1928/03/04   DATE OF ADMISSION:  10/20/2008  DATE OF DISCHARGE:  10/25/2008                               DISCHARGE SUMMARY   ADMITTING DIAGNOSES:  1. Atrial fibrillation with rapid ventricular response.  2. Weakness.  3. Dysphagia.  4. Chronic obstructive pulmonary disease.  5. Hypertension.  6. Retroperitoneal abscess, resolving.   DISCHARGE DIAGNOSES:  1. Atrial fibrillation with rapid ventricular response.  2. Weakness.  3. Dysphagia.  4. Chronic obstructive pulmonary disease.  5. Hypertension.  6. Retroperitoneal abscess, resolving.   CONSULTANTS:  None.   PROCEDURES:  1. The patient had a modified barium swallow which showed mild      oropharyngeal dysphagia with laryngeal penetration of thin liquids      due to decreased laryngeal elevation and closure.  Recommendations      were for reflux aspiration precautions but no dietary restrictions      were indicated.  2. Chest x-ray performed on the day of admission October 20, 2008 which      showed no infiltrate or congestive failure.  3. MRI of the brain without contrast which showed atrophy and small      vessel disease with no acute or focal intracranial abnormalities.   HISTORY OF PRESENT ILLNESS:  The patient had recently been hospitalized  because of a retroperitoneal abscess.  She had been discharged to home.  She had been alert and oriented.  She reported at home she had an  episode of weakness and a fall with no significant loss of  consciousness.  She had no fever, chest pain, shortness of breath,  wheezing, or orthopnea.  There was no loss of consciousness.  There was  no incontinence or other indication of seizure activity.  EKG by EMS did  reveal the patient to be in atrial  fibrillation that was definitely a  rapid ventricular response but had converted to sinus rhythm by the time  of arrival to the ER.  Because of her symptoms, she was admitted.  Of  note she also had been having a difficult time with p.o. intake and a  question of dehydration was raised at admission.   Please see the admission EMR note for past medical history, family  history, social history and physical exam at admission.   HOSPITAL COURSE:  1. Cardiovascular.  The patient had paroxysmal atrial fibrillation on      Aggrenox.  She had a 2-D echo as part of her evaluation, which was      read out as unremarkable and normal with no left atrial      enlargement.  The patient was maintained on telemetry and did have      a recurrent episode of PAF in the early morning of October 24, 2008      at 4 a.m., lasting 4-1/2 hours, then spontaneously converting.      Based on her  paroxysmal atrial fibrillation, discussed with her      increased stroke risk and that the maximum risk reduction would be      with the use of Coumadin.  The patient agreed and therefore      Coumadin therapy was initiated.  With the patient being cardiac      stable otherwise with no recurrent episodes of weakness, she will      be discharged home to continue Coumadin therapy and will have      followup at the [facility] on the day after discharge.  2. Weakness.  The patient had generally good strength during her      hospitalization, being able to ambulate with no assistance, up to a      chair without difficulty, and she herself felt stronger.  3. Leukocytosis.  The patient had a mild leukocytosis at the time of      admission which resolved spontaneously.  She had no evidence of      ongoing infection.  She was continued on Augmentin to complete a      full 14-day course.  4. Renal function.  The patient had some prerenal azotemia with a      creatinine of 1.42.  She was gently hydrated, with normalization of       her creatinine, with last reading being 0.87 and this problem is      considered resolved.  5. Hyperglycemia.  The patient does have some mild hyperglycemia.  A1c      was performed this admission, which was just over the borderline of      normal at 6.1%.  This can be managed by diet only.  6. COPD.  Patient with no respiratory discomfort, no difficulty, doing      well on room air.  7. Hypertension.  The patient's blood pressure has been labile.      During her hospital stay her blood pressure remained mostly stable      and on the day of discharge blood pressure was 135/62.  At this      point will have her continue with Bystolic at the time of discharge      to be further adjusted by Dr. Lovell Sheehan in outpatient followup.  8. Dysphagia.  A modified barium swallow was noted.  The patient does      do well with chin tuck and aspiration precautions.  9. ID.  Patient with no fever.  Leukocytosis, as noted, resolved.  The      patient has now completed a full 14-day course of Augmentin and      will not need to continue Augmentin at discharge.  She will follow      up with Dr. Corliss Skains.  Her appointment on October 22, 2008 was cancelled      because of admission and she will need to reschedule this on her      own.   DISCHARGE EXAMINATION:  Temperature of 98.1, blood pressure 135/62,  heart rate 92, respirations 18, O2 sats 93%.  GENERAL APPEARANCE:  This is a pleasant elderly woman sitting in a chair  in no acute distress.  HEENT:  Exam was unremarkable.  Neck was supple.  CHEST:  Patient is moving air well with no rales, wheezes or rhonchi.  No increased work of breathing.  CARDIOVASCULAR:  2+ radial pulse.  Her precordium was quiet.  Her heart  rate was regular.  I appreciated no murmurs.  Abdomen was protuberant, soft.  No guarding, no rebound, no tenderness  was noted.  PELVIC/RECTAL EXAMS:  Deferred.  Extremities without deformity or edema.   FINAL LABORATORY:  INR was 1.1 on the  day of discharge.  Hemoglobin A1c,  as noted, was 6.1%.  Basic metabolic panel from October 22, 2008 with a  sodium of 134, potassium 4.4, chloride 111, CO2 19, BUN of 9, creatinine  of 0.87, glucose of 103.  Final CBC October 22, 2008 with a hemoglobin of  11 grams, white count was 7700.  Cardiac enzymes were cycled x3 and were  negative.  The patient had a lipase at admission that was normal at 28.  Urinalysis at admission was unremarkable except for rare yeast.  The  patient did have a vitamin D level previously done that was 15 and she  will need to be on vitamin D replacement.  I  will defer this to Dr.  Lovell Sheehan.   DISCHARGE MEDICATIONS:  The patient will resume all of her home  medications including Spiriva 18 mcg 1 inhalation daily, Zocor 40 mg  daily, vitamin D 50,000 units weekly.  Augmentin is discontinued.  Protonix 40 mg daily.  Aggrenox discontinued.  Bystolic will be  discontinued.  New medications will include Coumadin 5 mg tablets 1-1/2  tablets until seen at [facility] for followup, and then to be adjusted  by Dr. Lovell Sheehan.  Diltiazem CD 240 mg once daily for blood pressure  control and rate control.   The patient's condition at the time of discharge dictation is stable and  improved.  The patient fully understands her atrial fibrillation  diagnosis and the need for blood thinners and she has been educated in  regard to Coumadin precautions.  She will be scheduled for a follow-up  protime on Friday, October 26, 2008 at [facility].      Rosalyn Gess Norins, MD  Electronically Signed     MEN/MEDQ  D:  10/25/2008  T:  10/25/2008  Job:  [id number]   cc:   Stacie Glaze, MD  [address] [place]  [place] [address]

## 2010-12-16 NOTE — H&P (Signed)
[name], [name]            ACCOUNT NO.:  [id number]   MEDICAL RECORD NO.:  [id number]          PATIENT TYPE:  INP   LOCATION:  0101                         FACILITY:  [facility]   PHYSICIAN:  Kela Millin, M.D.DATE OF BIRTH:  [birth year]   DATE OF ADMISSION:  06/09/2008  DATE OF DISCHARGE:                              HISTORY & PHYSICAL   PRIMARY CARE PHYSICIAN:  Dr. Darryll Capers with [facility].   CHIEF COMPLAINT:  Speech difficulty.   HISTORY OF PRESENT ILLNESS:  The patient is a 77 year old white female  with past medical history significant for CVA with some right  sided/wrist residual intermittent numbness, hypertension,  hyperlipidemia, history of bilateral carotid endarterectomies with  residual right carotid bruit, history of cancer of the larynx and status  post surgery several years a will, history of leg cramping, constipation  and status post right TKA who presents with the above complaints.  She  states that she was in her usual state of health until today when she  began having problems expressing exactly what she wanted to say. She  states that she knew what she wanted to say but could not articulate it.  This episode lasted for 30 minutes and then resolved, and she has been  back to her baseline.  She denies dysphagia, headaches, focal weakness,  dizziness, chest pain, palpitations, shortness of breath, diarrhea,  melena and no hematochezia.   She was seen in the ER, and a CT scan of her head showed atrophy and  chronic appearing small-vessel disease.  No sign of acute or reversible  process.  She is admitted for further evaluation and management.   PAST MEDICAL HISTORY:  As above.   MEDICATIONS:  1. Bystolic 5 mg p.o. q.h.s.  2. Zocor 40 mg p.o. q.h.s.  3. Aspirin 81 mg p.o. daily.  4. Dulcolax.  5. Metamucil.   ALLERGIES:  DARVON AND DEMEROL.   SOCIAL HISTORY:  She quit tobacco 25 years ago, she denies alcohol.   FAMILY HISTORY:  Her mother had  TIAs, otherwise noncontributory to  current illness.   REVIEW OF SYSTEMS:  As per HPI, other review of systems negative.   PHYSICAL EXAM:  GENERAL:  The patient is a pleasant elderly white female  in no apparent distress.  VITAL SIGNS: Her temperature is 98.5 with a blood pressure of 111/63,  pulse of 99, respiratory rate of 20, O2 sat of 99%.  HEENT:  PERRL, EOMI, no facial asymmetry, moist mucous membranes and no  oral exudates.  NECK:  Supple, no adenopathy, no thyromegaly, no JVD. Right carotid  bruit present.  LUNGS:  Clear to auscultation bilaterally.  No crackles or wheezes.  CARDIOVASCULAR:  Regular rate and rhythm.  Normal S1-S2.  ABDOMEN:  Soft, bowel sounds present, nontender, nondistended.  No  organomegaly and no masses palpable.  EXTREMITIES:  No cyanosis and no edema.  NEURO: She is alert and oriented x3.  Cranial nerves II-XII grossly  intact.  Strength is 4-5/5 and symmetric, nonfocal.   LABORATORY DATA:  CT scan of head as per HPI.  Her white cell count is  10.2, hemoglobin  of 11.1, hematocrit 33.8, platelet count 337,  neutrophil count of 80%.  INR is 1.1.  Sodium is 134 with a potassium of  4.2, chloride 101, CO2 of 25, glucose is 131, BUN of 12, creatinine is  1.09, calcium is 9.2, albumin 3.1, AST 12, ALT 12.   ASSESSMENT AND PLAN:  1. Probable transient ischemic attack- as discussed above, CT scan of      head is negative and the patient has refused an MRI. She states      that she is claustrophobic.  She has been on aspirin daily, so I      will change to Aggrenox at this time.  She is status post bilateral      carotid endarterectomy with residual right carotid bruits as      already noted above.  Will obtain 2-D echo, homocysteine level,      hemoglobin A1c level.  Follow and consider neurology consultation      in a.m. as appropriate for further recommendations.  2. Hypertension - on Bystolic, will follow.  3. History of hyperlipidemia - continue  Zocor.  4. Constipation - Dulcolax p.r.n., she has declined any enemas at this      time.  5. History of cerebrovascular accident - as above.      Kela Millin, M.D.  Electronically Signed     ACV/MEDQ  D:  06/09/2008  T:  06/10/2008  Job:  [id number]   cc:   Stacie Glaze, MD  [address] [place]  [place] [address]

## 2010-12-19 NOTE — Discharge Summary (Signed)
[name], [name]            ACCOUNT NO.:  [id number]   MEDICAL RECORD NO.:  [id number]          PATIENT TYPE:  INP   LOCATION:  1619                         FACILITY:  [facility]   PHYSICIAN:  Ollen Gross, M.D.    DATE OF BIRTH:  1928/04/10   DATE OF ADMISSION:  06/15/2007  DATE OF DISCHARGE:  06/19/2007                               DISCHARGE SUMMARY   ADMISSION DIAGNOSES:  1. Osteoarthritis, right knee  2. Hypertension.  3. Anxiety.  4. History of cerebrovascular accident with right-sided numbness in      hand and wrist.   DISCHARGE DIAGNOSES:  1. Osteoarthritis, right knee, status post right total knee      arthroplasty.  2. Mild postoperative blood loss anemia did not require transfusion.  3. Hypertension.  4. History of cerebrovascular accident with right-sided numbness in      hand and wrist.   PROCEDURE:  On June 15, 2007, right total knee, Dr. Lequita Halt.   CONSULTATIONS:  None.   BRIEF HISTORY:  [name] is a 77 year old female with end-stage  arthritis of the right knee, progressive worsening pain and dysfunction,  previous successful left total knee now presents for right total knee.   LABORATORY DATA:  Preop CBC:  Hemoglobin 12.8, hematocrit 37.9, white  cell count 8.1.  Postop hemoglobin 10.6, drifting down to 10.4.  Last  noted H&H 9.9 and 28.6.  PT/PTT preop 13.3 and 29, respectively.  INR  1.0.  Serial protimes followed.  Last noted PT/INR 20.9 and 1.7.  Chem  panel on admission all within normal limits.  Serial BMETs were  followed.  Electrolytes remained within normal limits.  Glucose went up  103-142, last noted at 156.  Preop UA with large leukocyte esterase,  many epithelials, 21-50 white cell, 0-2 red cells.   EKG on June 13, 2007:  Normal sinus rhythm, normal EKG.  No  previous.  Confirmed by Dr. Lucas Mallow.  Two-view chest on June 13, 2007:  Mild probable chronic bronchitic changes, no evidence of active disease.   HOSPITAL COURSE:  The  patient was admitted to [facility].  Tolerated procedure well.  Later transferred to the recovery room and  orthopedic floor.  Started on PCA and p.o. analgesic pain control  following surgery.  Did have some nausea on the evening of surgery, but  that was improved by day one.  Had switched the Percocet over to Vicodin  and later was switched to Dilaudid on day one since that was in her PCA  and she was tolerating that.  Starting to get up with therapy by day  two.  Had a rough evening on the evening of day one but doing a little  bit better by day two.  Hemoglobin was stable.  I discontinued the PCA  and the Foley.  Hep-locked the IV once her intake by mouth was better.  Slowly progressing with PT and got up about 15 feet.  She had a little  bit of orthostatic hypotension with ambulation.  Increased p.o. intake.  Therefore, she was held for monitoring and for therapy, slowly  progressing with PT  up about 20 feet by day three.  Dressing changed on  day two.  Incision looked good and was healing well throughout hospital  course.  By day four, she was doing much better.  Pressures had  stabilized.  Wound was healing well.  She got up and continued to walk  short distances, got up about 80 feet.  Had gotten to the point where  she was tolerating meds, nausea had resolved, and she was discharged  home.   DISCHARGE PLAN:  The patient discharged home on June 19, 2007.   DISCHARGE DIAGNOSES:  See above.   DISCHARGE MEDICATIONS:  Dilaudid, Robaxin, Coumadin.   FOLLOW UP:  Call Monday for appointment on Tuesday, July 05, 2007.   DIET:  As tolerated.   DISPOSITION:  Home.   CONDITION ON DISCHARGE:  Improved.      [name], P.A.C.      Ollen Gross, M.D.  Electronically Signed    ALP/MEDQ  D:  07/21/2007  T:  07/22/2007  Job:  [id number]

## 2011-01-05 ENCOUNTER — Other Ambulatory Visit: Payer: Self-pay | Admitting: Internal Medicine

## 2011-01-06 ENCOUNTER — Ambulatory Visit (INDEPENDENT_AMBULATORY_CARE_PROVIDER_SITE_OTHER): Payer: 59 | Admitting: Internal Medicine

## 2011-01-06 ENCOUNTER — Encounter: Payer: Self-pay | Admitting: Internal Medicine

## 2011-01-06 ENCOUNTER — Other Ambulatory Visit: Payer: Self-pay

## 2011-01-06 DIAGNOSIS — M549 Dorsalgia, unspecified: Secondary | ICD-10-CM

## 2011-01-06 MED ORDER — CYCLOBENZAPRINE HCL 5 MG PO TABS: 5.0000 mg | ORAL_TABLET | Freq: Three times a day (TID) | ORAL | Status: DC | PRN

## 2011-01-06 MED ORDER — METAXALONE 800 MG PO TABS: 800.0000 mg | ORAL_TABLET | Freq: Three times a day (TID) | ORAL | Status: DC | PRN

## 2011-01-06 NOTE — Assessment & Plan Note (Signed)
No red flag sx's. Avoid nsaids with coumadin use. Attempt skelaxin tid prn and cautioned regarding possible sedating effect. Followup if no improvement or worsening.

## 2011-01-06 NOTE — Progress Notes (Signed)
  Subjective:    Patient ID: [name], female    DOB: 02/23/1928, 77 y.o.   MRN: [id number]  HPI Pt presents to clinic for evaluation of back pain. Notes one day h/o left low back pain without injury/trauma. Awoke with the pain and finds it worsened with position change. Denies radicular leg pain, numbness/tingling or leg weakness. Attempted localized ice/heat and tylenol prn without significant improvement. Also notes recent left lateral neck pain worse with movement that she attributes to having moved the television to a different viewing position. No other alleviating or exacerbating factors. No other complaints.  Reviewed pmh, medications and allergies    Review of Systems  Constitutional: Negative for fever and chills.  Musculoskeletal: Positive for back pain. Negative for myalgias, joint swelling and gait problem.  Neurological: Negative for weakness and numbness.       Objective:   Physical Exam  Nursing note and vitals reviewed. Constitutional: She appears well-developed and well-nourished. No distress.  HENT:  Head: Normocephalic and atraumatic.  Nose: Nose normal.  Eyes: Conjunctivae are normal. No scleral icterus.  Neck: Neck supple.  Musculoskeletal:       FROM of neck. No midline ls tenderness or bony abnormality. Mild tenderness to palpation left low back left of center and possible tenderness at left SI joint. Modified SLR neg and bilateral lower extremity strength 5/5.  Neurological: She is alert.  Skin: Skin is warm and dry. No rash noted. She is not diaphoretic. No erythema.  Psychiatric: She has a normal mood and affect.          Assessment & Plan:

## 2011-01-27 ENCOUNTER — Encounter: Payer: Self-pay | Admitting: Internal Medicine

## 2011-01-27 ENCOUNTER — Ambulatory Visit (INDEPENDENT_AMBULATORY_CARE_PROVIDER_SITE_OTHER): Payer: 59 | Admitting: Internal Medicine

## 2011-01-27 VITALS — BP 158/80 | HR 92 | Wt 188.0 lb

## 2011-01-27 DIAGNOSIS — T148XXA Other injury of unspecified body region, initial encounter: Secondary | ICD-10-CM

## 2011-01-27 DIAGNOSIS — Z23 Encounter for immunization: Secondary | ICD-10-CM

## 2011-01-27 DIAGNOSIS — S61412A Laceration without foreign body of left hand, initial encounter: Secondary | ICD-10-CM

## 2011-01-27 DIAGNOSIS — S61409A Unspecified open wound of unspecified hand, initial encounter: Secondary | ICD-10-CM

## 2011-01-27 DIAGNOSIS — IMO0002 Reserved for concepts with insufficient information to code with codable children: Secondary | ICD-10-CM

## 2011-01-30 ENCOUNTER — Encounter: Payer: Self-pay | Admitting: Internal Medicine

## 2011-01-30 ENCOUNTER — Ambulatory Visit (INDEPENDENT_AMBULATORY_CARE_PROVIDER_SITE_OTHER): Payer: 59 | Admitting: Internal Medicine

## 2011-01-30 DIAGNOSIS — I10 Essential (primary) hypertension: Secondary | ICD-10-CM

## 2011-01-30 DIAGNOSIS — M549 Dorsalgia, unspecified: Secondary | ICD-10-CM

## 2011-01-30 DIAGNOSIS — Z96659 Presence of unspecified artificial knee joint: Secondary | ICD-10-CM

## 2011-01-30 DIAGNOSIS — I4891 Unspecified atrial fibrillation: Secondary | ICD-10-CM

## 2011-01-30 DIAGNOSIS — J449 Chronic obstructive pulmonary disease, unspecified: Secondary | ICD-10-CM

## 2011-01-30 DIAGNOSIS — B079 Viral wart, unspecified: Secondary | ICD-10-CM

## 2011-01-30 NOTE — Progress Notes (Signed)
Subjective:    Patient ID: [name], female    DOB: 06-04-1928, 77 y.o.   MRN: [id number]  HPI Patient presents for followup of hypertension.  She has 3 acute complaints #1 he has a lesion on her right neck below the pinna that appears to be peroxidase that she wishes removed.  She notices abdominal swelling that she states is more prominent when she lies flat and she has edema that is more prominent at the end of the day.  It should be noted that her vital signs she is getting 10 pounds since her last office visit she does state that she has  begun an exercise   Review of Systems  Constitutional: Negative for activity change, appetite change and fatigue.  HENT: Negative for ear pain, congestion, neck pain, postnasal drip and sinus pressure.   Eyes: Negative for redness and visual disturbance.  Respiratory: Negative for cough, shortness of breath and wheezing.   Gastrointestinal: Negative for abdominal pain and abdominal distention.  Genitourinary: Negative for dysuria, frequency and menstrual problem.  Musculoskeletal: Negative for myalgias, joint swelling and arthralgias.  Skin: Negative for rash and wound.  Neurological: Negative for dizziness, weakness and headaches.  Hematological: Negative for adenopathy. Does not bruise/bleed easily.  Psychiatric/Behavioral: Negative for sleep disturbance and decreased concentration.   Past Medical History  Diagnosis Date  . GERD (gastroesophageal reflux disease)   . Arthritis   . COPD (chronic obstructive pulmonary disease)   . Stroke   . History of TIAs   . Diverticulosis   . PUD (peptic ulcer disease)   . PVD (peripheral vascular disease)   . Hyperlipidemia   . Anxiety   . Depression   . Laryngeal carcinoma hx of ca    remotely with resection and xrt/chronic hoarsemenss   Past Surgical History  Procedure Date  . Appendectomy     reports that she has quit smoking. She does not have any smokeless tobacco history on  file. She reports that she does not drink alcohol or use illicit drugs. family history includes Cancer in her father and sister; Hyperlipidemia in her mother; and Stroke in her mother. Allergies  Allergen Reactions  . Propoxyphene Hcl   . Propoxyphene N-Acetaminophen     REACTION: Reaction not known       Objective:   Physical Exam  Constitutional: She is oriented to person, place, and time. She appears well-developed and well-nourished. No distress.  HENT:  Head: Normocephalic and atraumatic.  Right Ear: External ear normal.  Left Ear: External ear normal.  Nose: Nose normal.  Mouth/Throat: Oropharynx is clear and moist.  Eyes: Conjunctivae and EOM are normal. Pupils are equal, round, and reactive to light.  Neck: Normal range of motion. Neck supple. No JVD present. No tracheal deviation present. No thyromegaly present.  Cardiovascular: Normal rate, regular rhythm, normal heart sounds and intact distal pulses.   No murmur heard. Pulmonary/Chest: Effort normal and breath sounds normal. She has no wheezes. She exhibits no tenderness.  Abdominal: Soft. Bowel sounds are normal. She exhibits distension. There is tenderness.  Musculoskeletal: Normal range of motion. She exhibits no edema and no tenderness.  Lymphadenopathy:    She has no cervical adenopathy.  Neurological: She is alert and oriented to person, place, and time. She has normal reflexes. No cranial nerve deficit.  Skin: Skin is warm and dry. She is not diaphoretic.  Psychiatric: She has a normal mood and affect. Her behavior is normal.  Assessment & Plan:  We discussed the etiology of her lower extremity edema related to her weight and her hypertension we discussed elevating her legs as much as possible she does not appear to have pathologic edema of her lower extremity in fact on examination today there is no edema she does have contributing factors including varicose veins upper extremities.  Her abdominal  swelling is centripetal obesity.  We discussed the only way to lose that was through exercise and caloric control.   The lesion on her neck was identified as a wart using sterile technique the wart was removed and bandage placed patient tolerated the procedure well  Blood pressure stable on current medications she has mild persistent COPD for which she takes Spiriva but has no complaint of increased shortness of breath

## 2011-02-01 NOTE — Assessment & Plan Note (Signed)
Bleeding resolved. Depth minimal and does not appear amenable to suturing. Wound cleaned and Steri-Strips applied. Tetanus booster provided. Monitor for signs and symptoms of infection. Patient declines x-ray of hand. Followup if no improvement or worsening.

## 2011-02-01 NOTE — Progress Notes (Signed)
  Subjective:    Patient ID: [name], female    DOB: 08-21-1927, 77 y.o.   MRN: [id number]  HPI Patient presents to clinic for evaluation of laceration. Today a door accidentally closed on the left dorsal aspect of her hand and she sustained a lateral fine laceration with mild depth. She applied pressure and the bleeding stopped. Denies any significant bony pain and has full range of motion of hand. No alleviating or excess pain factors. No other complaints.  Reviewed past medical history, medications and allergies  Review of Systems  Constitutional: Negative for fever and chills.  Musculoskeletal: Positive for arthralgias.  Hematological: Does not bruise/bleed easily.       Objective:   Physical Exam  Nursing note and vitals reviewed. Constitutional: She appears well-developed and well-nourished. No distress.  HENT:  Head: Normocephalic and atraumatic.  Musculoskeletal:       Full range of motion left hand. No obvious bony abnormality.  Neurological: She is alert.  Skin: Skin is warm and dry. No rash noted. She is not diaphoretic. No erythema. No pallor.       Left hand dorsal aspect with a lateral fine laceration with minimal depth. No bleeding. No erythema warmth or drainage. Tendons intact  Psychiatric: She has a normal mood and affect.          Assessment & Plan:

## 2011-02-02 ENCOUNTER — Ambulatory Visit (INDEPENDENT_AMBULATORY_CARE_PROVIDER_SITE_OTHER): Payer: 59

## 2011-02-02 DIAGNOSIS — I4891 Unspecified atrial fibrillation: Secondary | ICD-10-CM

## 2011-02-02 NOTE — Patient Instructions (Signed)
Same dose  Check in 4 weeks  

## 2011-02-19 ENCOUNTER — Other Ambulatory Visit: Payer: Self-pay | Admitting: Internal Medicine

## 2011-03-25 ENCOUNTER — Encounter: Payer: Self-pay | Admitting: Family Medicine

## 2011-03-25 ENCOUNTER — Ambulatory Visit (INDEPENDENT_AMBULATORY_CARE_PROVIDER_SITE_OTHER): Payer: 59 | Admitting: Family Medicine

## 2011-03-25 VITALS — BP 124/66 | HR 76 | Temp 98.5°F | Wt 199.0 lb

## 2011-03-25 DIAGNOSIS — N390 Urinary tract infection, site not specified: Secondary | ICD-10-CM

## 2011-03-25 MED ORDER — CIPROFLOXACIN HCL 500 MG PO TABS: 500.0000 mg | ORAL_TABLET | Freq: Two times a day (BID) | ORAL | Status: AC

## 2011-03-25 NOTE — Progress Notes (Signed)
  Subjective:    Patient ID: [name], female    DOB: 10-17-1927, 77 y.o.   MRN: [id number]  HPI Here for 4 days of urinary urgency, burning, and pelvic spasms on urinations. No back pain or fever or nausea. She drinks plenty of fluids. She is unable to produce a urine specimen today.    Review of Systems  Constitutional: Negative.   Gastrointestinal: Negative.   Genitourinary: Positive for dysuria, urgency, frequency, difficulty urinating and pelvic pain. Negative for hematuria and flank pain.       Objective:   Physical Exam  Constitutional: She appears well-developed and well-nourished.  Abdominal: Soft. Bowel sounds are normal. She exhibits no distension and no mass. There is no tenderness. There is no rebound and no guarding.          Assessment & Plan:  Treat empirically.

## 2011-04-29 ENCOUNTER — Encounter: Payer: Self-pay | Admitting: Internal Medicine

## 2011-04-29 ENCOUNTER — Ambulatory Visit (INDEPENDENT_AMBULATORY_CARE_PROVIDER_SITE_OTHER): Payer: Medicare Other | Admitting: Internal Medicine

## 2011-04-29 DIAGNOSIS — I4891 Unspecified atrial fibrillation: Secondary | ICD-10-CM

## 2011-04-29 DIAGNOSIS — R109 Unspecified abdominal pain: Secondary | ICD-10-CM

## 2011-04-29 DIAGNOSIS — I10 Essential (primary) hypertension: Secondary | ICD-10-CM

## 2011-04-29 DIAGNOSIS — R197 Diarrhea, unspecified: Secondary | ICD-10-CM

## 2011-04-29 NOTE — Progress Notes (Signed)
Subjective:    Patient ID: [name], female    DOB: 01/08/1928, 77 y.o.   MRN: [id number]  HPI Lost weight! Has been doing water aerobics at the [facility]. Has pain in the latissimus dorsi in the left side. Has intermitant diarrhea once a week but otherwise stools are soft and forms. Patient's blood pressure on recheck was well controlled 130/80 we'll monitor CBC for her anemia as well as a basic metabolic panel and renal function    Review of Systems  Constitutional: Negative for activity change, appetite change and fatigue.  HENT: Negative for ear pain, congestion, neck pain, postnasal drip and sinus pressure.   Eyes: Negative for redness and visual disturbance.  Respiratory: Negative for cough, shortness of breath and wheezing.   Gastrointestinal: Negative for abdominal pain and abdominal distention.  Genitourinary: Negative for dysuria, frequency and menstrual problem.  Musculoskeletal: Negative for myalgias, joint swelling and arthralgias.  Skin: Negative for rash and wound.  Neurological: Negative for dizziness, weakness and headaches.  Hematological: Negative for adenopathy. Does not bruise/bleed easily.  Psychiatric/Behavioral: Negative for sleep disturbance and decreased concentration.   Past Medical History  Diagnosis Date  . GERD (gastroesophageal reflux disease)   . Arthritis   . COPD (chronic obstructive pulmonary disease)   . Stroke   . History of TIAs   . Diverticulosis   . PUD (peptic ulcer disease)   . PVD (peripheral vascular disease)   . Hyperlipidemia   . Anxiety   . Depression   . Laryngeal carcinoma hx of ca    remotely with resection and xrt/chronic hoarsemenss   Past Surgical History  Procedure Date  . Appendectomy     reports that she has quit smoking. She has never used smokeless tobacco. She reports that she does not drink alcohol or use illicit drugs. family history includes Cancer in her father and sister; Hyperlipidemia in her mother; and  Stroke in her mother. Allergies  Allergen Reactions  . Propoxyphene Hcl   . Propoxyphene N-Acetaminophen     REACTION: Reaction not known       Objective:   Physical Exam  Nursing note and vitals reviewed. Constitutional: She is oriented to person, place, and time. She appears well-developed and well-nourished. No distress.  HENT:  Head: Normocephalic and atraumatic.  Right Ear: External ear normal.  Left Ear: External ear normal.  Nose: Nose normal.  Mouth/Throat: Oropharynx is clear and moist.  Eyes: Conjunctivae and EOM are normal. Pupils are equal, round, and reactive to light.  Neck: Normal range of motion. Neck supple. No JVD present. No tracheal deviation present. No thyromegaly present.  Cardiovascular: Normal rate, regular rhythm, normal heart sounds and intact distal pulses.   No murmur heard. Pulmonary/Chest: Effort normal and breath sounds normal. She has no wheezes. She exhibits no tenderness.  Abdominal: Soft. Bowel sounds are normal.  Musculoskeletal: Normal range of motion. She exhibits no edema and no tenderness.  Lymphadenopathy:    She has no cervical adenopathy.  Neurological: She is alert and oriented to person, place, and time. She has normal reflexes. No cranial nerve deficit.  Skin: Skin is warm and dry. She is not diaphoretic.  Psychiatric: She has a normal mood and affect. Her behavior is normal.          Assessment & Plan:  She possibly has an acquired lactose include in color and she has been on high-dose antibiotics in the past I doubt she has normal flora in her colon.  We will  begin an intervention with a probiotic which I will give her samples to reestablish the appropriate gut flora   Blood pressure stable.

## 2011-04-29 NOTE — Progress Notes (Signed)
Addended by: Bonnye Fava on: 04/29/2011 12:42 PM   Modules accepted: Orders

## 2011-04-29 NOTE — Patient Instructions (Addendum)
Take the probiotic daily COUMADIN: Same dose, 7.5 mg everyday, check in 4 weeks

## 2011-05-05 LAB — COMPREHENSIVE METABOLIC PANEL
AST: 12
Albumin: 3.1 — ABNORMAL LOW
Calcium: 9.2
Creatinine, Ser: 1.09
GFR calc Af Amer: 59 — ABNORMAL LOW

## 2011-05-05 LAB — URINALYSIS, ROUTINE W REFLEX MICROSCOPIC
Glucose, UA: NEGATIVE
Specific Gravity, Urine: 1.01
Urobilinogen, UA: 0.2

## 2011-05-05 LAB — URINE MICROSCOPIC-ADD ON

## 2011-05-05 LAB — BASIC METABOLIC PANEL
BUN: 11
CO2: 24
Calcium: 8.8
Chloride: 102
Chloride: 107
Creatinine, Ser: 0.99
Glucose, Bld: 130 — ABNORMAL HIGH
Glucose, Bld: 130 — ABNORMAL HIGH
Potassium: 4.5
Sodium: 139

## 2011-05-05 LAB — DIFFERENTIAL
Eosinophils Relative: 0
Lymphocytes Relative: 11 — ABNORMAL LOW
Lymphs Abs: 1.1
Monocytes Absolute: 0.9

## 2011-05-05 LAB — CBC
HCT: 35.1 — ABNORMAL LOW
Hemoglobin: 11.5 — ABNORMAL LOW
MCHC: 32.6
MCHC: 32.7
MCV: 83.2
MCV: 83.6
Platelets: 337
RDW: 15.1
WBC: 10.2

## 2011-05-05 LAB — HEMOGLOBIN A1C: Hgb A1c MFr Bld: 6.4 — ABNORMAL HIGH

## 2011-05-08 LAB — CBC
HCT: 30.9 % — ABNORMAL LOW (ref 36.0–46.0)
HCT: 31.9 % — ABNORMAL LOW (ref 36.0–46.0)
Hemoglobin: 10.4 g/dL — ABNORMAL LOW (ref 12.0–15.0)
Hemoglobin: 10.5 g/dL — ABNORMAL LOW (ref 12.0–15.0)
MCV: 81.8 fL (ref 78.0–100.0)
RBC: 3.79 MIL/uL — ABNORMAL LOW (ref 3.87–5.11)
RBC: 3.9 MIL/uL (ref 3.87–5.11)
WBC: 4.6 10*3/uL (ref 4.0–10.5)
WBC: 6.6 10*3/uL (ref 4.0–10.5)

## 2011-05-08 LAB — COMPREHENSIVE METABOLIC PANEL
Alkaline Phosphatase: 65 U/L (ref 39–117)
BUN: 8 mg/dL (ref 6–23)
CO2: 24 mEq/L (ref 19–32)
Chloride: 105 mEq/L (ref 96–112)
Creatinine, Ser: 0.85 mg/dL (ref 0.4–1.2)
GFR calc non Af Amer: >60 mL/min (ref >60)
Potassium: 4.1 mEq/L (ref 3.5–5.1)
Total Bilirubin: 0.5 mg/dL (ref 0.3–1.2)

## 2011-05-08 LAB — DIFFERENTIAL
Basophils Absolute: 0 10*3/uL (ref 0.0–0.1)
Basophils Absolute: 0 10*3/uL (ref 0.0–0.1)
Eosinophils Relative: 2 % (ref 0–5)
Lymphocytes Relative: 29 % (ref 12–46)
Lymphocytes Relative: 30 % (ref 12–46)
Lymphs Abs: 1.4 10*3/uL (ref 0.7–4.0)
Lymphs Abs: 1.9 10*3/uL (ref 0.7–4.0)
Monocytes Absolute: 0.4 10*3/uL (ref 0.1–1.0)
Neutro Abs: 2.6 10*3/uL (ref 1.7–7.7)
Neutro Abs: 4.1 10*3/uL (ref 1.7–7.7)
Neutrophils Relative %: 62 % (ref 43–77)

## 2011-05-08 LAB — LIPASE, BLOOD: Lipase: 24 U/L (ref 11–59)

## 2011-05-12 LAB — CBC
HCT: 28.6 — ABNORMAL LOW
HCT: 30 — ABNORMAL LOW
HCT: 31.4 — ABNORMAL LOW
HCT: 37.9
Hemoglobin: 9.9 — ABNORMAL LOW
Hemoglobin: 10.6 — ABNORMAL LOW
Hemoglobin: 12.8
MCHC: 33.7
MCHC: 33.8
MCHC: 34.7
MCV: 83.2
MCV: 83.6
Platelets: 194
RBC: 3.6 — ABNORMAL LOW
RBC: 3.71 — ABNORMAL LOW
RBC: 4.46
RDW: 14.6
RDW: 15
RDW: 15.3

## 2011-05-12 LAB — BASIC METABOLIC PANEL
CO2: 29
CO2: 30
Chloride: 103
Creatinine, Ser: 0.83
GFR calc Af Amer: >60
GFR calc Af Amer: >60
GFR calc non Af Amer: 57 — ABNORMAL LOW
Glucose, Bld: 142 — ABNORMAL HIGH
Potassium: 4
Potassium: 4.2
Sodium: 135

## 2011-05-12 LAB — COMPREHENSIVE METABOLIC PANEL
ALT: 20
Alkaline Phosphatase: 62
BUN: 15
CO2: 30
GFR calc non Af Amer: >60
Glucose, Bld: 103 — ABNORMAL HIGH
Potassium: 4.5
Sodium: 144

## 2011-05-12 LAB — PROTIME-INR
INR: 1.7 — ABNORMAL HIGH
Prothrombin Time: 20.9 — ABNORMAL HIGH
Prothrombin Time: 13.3
Prothrombin Time: 16.4 — ABNORMAL HIGH

## 2011-05-12 LAB — URINALYSIS, ROUTINE W REFLEX MICROSCOPIC
Bilirubin Urine: NEGATIVE
Ketones, ur: NEGATIVE
Nitrite: NEGATIVE
Protein, ur: NEGATIVE
Urobilinogen, UA: 1

## 2011-05-12 LAB — TYPE AND SCREEN
ABO/RH(D): O POS
Antibody Screen: NEGATIVE

## 2011-05-12 LAB — ABO/RH: ABO/RH(D): O POS

## 2011-05-12 LAB — APTT: aPTT: 29

## 2011-05-28 ENCOUNTER — Ambulatory Visit (INDEPENDENT_AMBULATORY_CARE_PROVIDER_SITE_OTHER): Payer: Medicare Other

## 2011-05-28 DIAGNOSIS — I4891 Unspecified atrial fibrillation: Secondary | ICD-10-CM

## 2011-05-28 NOTE — Patient Instructions (Signed)
  Latest dosing instructions   Total Sun Mon Tue Wed Thu Fri Sat   52.5 7.5 mg 7.5 mg 7.5 mg 7.5 mg 7.5 mg 7.5 mg 7.5 mg    (5 mg1.5) (5 mg1.5) (5 mg1.5) (5 mg1.5) (5 mg1.5) (5 mg1.5) (5 mg1.5)        

## 2011-06-24 ENCOUNTER — Telehealth: Payer: Self-pay | Admitting: Internal Medicine

## 2011-06-24 MED ORDER — MUPIROCIN 2 % EX OINT: TOPICAL_OINTMENT | CUTANEOUS | Status: DC

## 2011-06-24 NOTE — Telephone Encounter (Signed)
Pt called again. Checking on the status of her sxs. Does she need to be seen or what, she stated?

## 2011-06-24 NOTE — Telephone Encounter (Signed)
Deb can you triage?

## 2011-06-24 NOTE — Telephone Encounter (Addendum)
No temp.  Never had MRSA before.  In store and felt this tremendous pain Between her shoulders. The area is red and raised.  Not hot.  Better today than yesterday.

## 2011-06-24 NOTE — Telephone Encounter (Signed)
Pt calls and says that she is hurting and has a sore in upper, center of back. Pt says it is may be MRSA. Pt req call back from nurse. Pls call asap.

## 2011-06-24 NOTE — Telephone Encounter (Signed)
The patient to examine the area or have her husband examine the area is there any evidence of shingles. Does the area across the midline of her back (on both sides of the back) if the area is red and tender and is on one side of the body is most probably shingles and she could have a prescription for valacyclovir 1 g 3 times a day called in for 7 day if the area is simply raised, red and improved from yesterday meaning that the size of the redness has decreased) I doubt that it either represents shingles or an infection at the site and she should treat it locally with heat

## 2011-06-24 NOTE — Telephone Encounter (Signed)
Per pt there is a lesion on right and left with straight line- pain in neck started after going into swimming pool on Tuesday.  Per d jenkins send in [place] adn call Friday if not better

## 2011-07-05 ENCOUNTER — Other Ambulatory Visit: Payer: Self-pay | Admitting: Internal Medicine

## 2011-08-04 DIAGNOSIS — J189 Pneumonia, unspecified organism: Secondary | ICD-10-CM

## 2011-08-04 HISTORY — DX: Pneumonia, unspecified organism: J18.9

## 2011-08-25 ENCOUNTER — Encounter: Payer: Self-pay | Admitting: Internal Medicine

## 2011-08-25 ENCOUNTER — Ambulatory Visit (INDEPENDENT_AMBULATORY_CARE_PROVIDER_SITE_OTHER): Payer: Medicare Other | Admitting: Internal Medicine

## 2011-08-25 VITALS — BP 126/76 | HR 76 | Temp 98.3°F | Resp 16 | Ht 64.5 in | Wt 198.0 lb

## 2011-08-25 DIAGNOSIS — F419 Anxiety disorder, unspecified: Secondary | ICD-10-CM

## 2011-08-25 DIAGNOSIS — R609 Edema, unspecified: Secondary | ICD-10-CM

## 2011-08-25 DIAGNOSIS — J449 Chronic obstructive pulmonary disease, unspecified: Secondary | ICD-10-CM

## 2011-08-25 DIAGNOSIS — F411 Generalized anxiety disorder: Secondary | ICD-10-CM

## 2011-08-25 DIAGNOSIS — R0989 Other specified symptoms and signs involving the circulatory and respiratory systems: Secondary | ICD-10-CM

## 2011-08-25 DIAGNOSIS — I10 Essential (primary) hypertension: Secondary | ICD-10-CM

## 2011-08-25 DIAGNOSIS — R0600 Dyspnea, unspecified: Secondary | ICD-10-CM

## 2011-08-25 DIAGNOSIS — K219 Gastro-esophageal reflux disease without esophagitis: Secondary | ICD-10-CM

## 2011-08-25 DIAGNOSIS — I4891 Unspecified atrial fibrillation: Secondary | ICD-10-CM

## 2011-08-25 DIAGNOSIS — R0609 Other forms of dyspnea: Secondary | ICD-10-CM

## 2011-08-25 LAB — CBC
HCT: 39.6 % (ref 36.0–46.0)
Hemoglobin: 13.1 g/dL (ref 12.0–15.0)
MCHC: 33 g/dL (ref 30.0–36.0)
MCV: 84.7 fl (ref 78.0–100.0)
Platelets: 232 10*3/uL (ref 150.0–400.0)
RBC: 4.68 Mil/uL (ref 3.87–5.11)

## 2011-08-25 LAB — BASIC METABOLIC PANEL
BUN: 20 mg/dL (ref 6–23)
CO2: 27 mEq/L (ref 19–32)
Chloride: 107 mEq/L (ref 96–112)
GFR: 51.15 mL/min — ABNORMAL LOW (ref >60.00)
Glucose, Bld: 103 mg/dL — ABNORMAL HIGH (ref 70–99)
Potassium: 4.9 mEq/L (ref 3.5–5.1)
Sodium: 140 mEq/L (ref 135–145)

## 2011-08-25 MED ORDER — ALPRAZOLAM 0.5 MG PO TABS: 0.5000 mg | ORAL_TABLET | Freq: Three times a day (TID) | ORAL | Status: DC | PRN

## 2011-08-25 MED ORDER — FUROSEMIDE 20 MG PO TABS: 20.0000 mg | ORAL_TABLET | Freq: Every day | ORAL | Status: DC

## 2011-08-25 NOTE — Patient Instructions (Addendum)
The new medication is Lasix, it should help get some of the swelling out of your legs    Latest dosing instructions   Total Sun Mon Tue Wed Thu Fri Sat   52.5 7.5 mg 7.5 mg 7.5 mg 7.5 mg 7.5 mg 7.5 mg 7.5 mg    (5 mg1.5) (5 mg1.5) (5 mg1.5) (5 mg1.5) (5 mg1.5) (5 mg1.5) (5 mg1.5)

## 2011-08-25 NOTE — Progress Notes (Signed)
Subjective:    Patient ID: [name], female    DOB: 1927/12/16, 78 y.o.   MRN: [id number]  HPI She notes a sore throat for a week with a cough and increased hoarseness. Cough is productiive of white sputum. No fever or chills. Noted stable edema in legs. Exercising without chest pain. Has noted weight gain.  Has a persistent rash on her arm and leg. The leg may be "stasis dermatitis" Hypertension:stable Has not been using the spiriva      Review of Systems  Constitutional: Negative for fever, activity change, appetite change and fatigue.  HENT: Positive for congestion, rhinorrhea and postnasal drip. Negative for ear pain, neck pain and sinus pressure.   Eyes: Negative for redness and visual disturbance.  Respiratory: Positive for cough. Negative for shortness of breath and wheezing.   Cardiovascular:       Edema in legs   Gastrointestinal: Negative for abdominal pain and abdominal distention.  Genitourinary: Negative for dysuria, frequency and menstrual problem.  Musculoskeletal: Positive for back pain. Negative for myalgias, joint swelling and arthralgias.  Skin: Negative for rash and wound.  Neurological: Negative for dizziness, weakness and headaches.  Hematological: Negative for adenopathy. Does not bruise/bleed easily.  Psychiatric/Behavioral: Negative for sleep disturbance and decreased concentration.   Past Medical History  Diagnosis Date  . GERD (gastroesophageal reflux disease)   . Arthritis   . COPD (chronic obstructive pulmonary disease)   . Stroke   . History of TIAs   . Diverticulosis   . PUD (peptic ulcer disease)   . PVD (peripheral vascular disease)   . Hyperlipidemia   . Anxiety   . Depression   . Laryngeal carcinoma hx of ca    remotely with resection and xrt/chronic hoarsemenss    History   Social History  . Marital Status: Married    Spouse Name: N/A    Number of Children: N/A  . Years of Education: N/A   Occupational History  .  retired    Social History Main Topics  . Smoking status: Former Games developer  . Smokeless tobacco: Never Used  . Alcohol Use: No  . Drug Use: No  . Sexually Active: Yes   Other Topics Concern  . Not on file   Social History Narrative  . No narrative on file    Past Surgical History  Procedure Date  . Appendectomy     Family History  Problem Relation Age of Onset  . Cancer Sister     colon  . Hyperlipidemia Mother   . Stroke Mother   . Cancer Father     mesothelioma    Allergies  Allergen Reactions  . Propoxyphene Hcl   . Propoxyphene N-Acetaminophen     REACTION: Reaction not known    Current Outpatient Prescriptions on File Prior to Visit  Medication Sig Dispense Refill  . bisoprolol (ZEBETA) 5 MG tablet TAKE ONE TABLET BY MOUTH EVERY DAY  30 tablet  11  . diltiazem (CARDIZEM CD) 180 MG 24 hr capsule TAKE ONE CAPSULE BY MOUTH EVERY DAY  30 capsule  11  . mupirocin ointment (BACTROBAN) 2 % Apply to affected area 3 times daily  15 g  0  . omeprazole (PRILOSEC) 40 MG capsule Take 40 mg by mouth daily as needed.        Marland Kitchen SPIRIVA HANDIHALER 18 MCG inhalation capsule INHALE ONE DOSE EVERY DAY  30 each  11  . Vitamin D, Ergocalciferol, (DRISDOL) 50000 UNITS CAPS TAKE ONE CAPSULE BY  MOUTH ONCE A WEEK WITH FOOD  5 capsule  3  . warfarin (COUMADIN) 5 MG tablet Take 1.5 tablets (7.5 mg total) by mouth daily.  60 tablet  11    BP 126/76  Pulse 76  Temp 98.3 F (36.8 C)  Resp 16  Ht 5' 4.5" (1.638 m)  Wt 198 lb (89.812 kg)  BMI 33.46 kg/m2       Objective:   Physical Exam  Nursing note and vitals reviewed. Constitutional: She is oriented to person, place, and time. She appears well-developed and well-nourished. No distress.       hoarse  HENT:  Head: Normocephalic and atraumatic.       Erythema and posterior cobblestoning  Eyes: Conjunctivae and EOM are normal. Pupils are equal, round, and reactive to light.  Neck: No JVD present. No tracheal deviation present. No  thyromegaly present.  Cardiovascular: Normal rate and regular rhythm.   Murmur heard. Pulmonary/Chest: She has no wheezes. She has no rales. She exhibits no tenderness.  Abdominal: Soft. Bowel sounds are normal.  Musculoskeletal: Normal range of motion. She exhibits no edema and no tenderness.  Lymphadenopathy:    She has no cervical adenopathy.  Neurological: She is alert and oriented to person, place, and time. She has normal reflexes. No cranial nerve deficit.  Skin: Skin is warm and dry. She is not diaphoretic.  Psychiatric: She has a normal mood and affect. Her behavior is normal.          Assessment & Plan:    COPD... we need to use the medications regularly, samples were given to patient age in her compliance. Her heart rate is very stable on the combination of Cardizem and Zebeta she is in regular rhythm today however I do believe she may require a mild diuretic because she is third spacing fluid in her legs.

## 2011-08-25 NOTE — Progress Notes (Signed)
Addended by: Rita Ohara R on: 08/25/2011 11:15 AM   Modules accepted: Orders

## 2011-09-24 ENCOUNTER — Ambulatory Visit: Payer: Medicare Other

## 2011-09-28 ENCOUNTER — Ambulatory Visit (INDEPENDENT_AMBULATORY_CARE_PROVIDER_SITE_OTHER): Payer: Medicare Other | Admitting: Internal Medicine

## 2011-09-28 DIAGNOSIS — I4891 Unspecified atrial fibrillation: Secondary | ICD-10-CM

## 2011-09-28 LAB — POCT INR: INR: 1.9

## 2011-09-28 NOTE — Patient Instructions (Signed)
  Latest dosing instructions   Total Sun Mon Tue Wed Thu Fri Sat   52.5 7.5 mg 7.5 mg 7.5 mg 7.5 mg 7.5 mg 7.5 mg 7.5 mg    (5 mg1.5) (5 mg1.5) (5 mg1.5) (5 mg1.5) (5 mg1.5) (5 mg1.5) (5 mg1.5)        

## 2011-10-23 ENCOUNTER — Ambulatory Visit: Payer: Medicare Other | Admitting: Internal Medicine

## 2011-11-10 ENCOUNTER — Ambulatory Visit (INDEPENDENT_AMBULATORY_CARE_PROVIDER_SITE_OTHER): Payer: Medicare Other | Admitting: Internal Medicine

## 2011-11-10 DIAGNOSIS — I4891 Unspecified atrial fibrillation: Secondary | ICD-10-CM

## 2011-11-10 NOTE — Patient Instructions (Signed)
  Latest dosing instructions   Total Sun Mon Tue Wed Thu Fri Sat   52.5 7.5 mg 7.5 mg 7.5 mg 7.5 mg 7.5 mg 7.5 mg 7.5 mg    (5 mg1.5) (5 mg1.5) (5 mg1.5) (5 mg1.5) (5 mg1.5) (5 mg1.5) (5 mg1.5)        

## 2011-11-20 ENCOUNTER — Encounter: Payer: Self-pay | Admitting: Family Medicine

## 2011-11-20 ENCOUNTER — Ambulatory Visit (INDEPENDENT_AMBULATORY_CARE_PROVIDER_SITE_OTHER): Payer: Medicare Other | Admitting: Family Medicine

## 2011-11-20 VITALS — BP 104/68 | HR 81 | Temp 98.3°F | Wt 194.0 lb

## 2011-11-20 DIAGNOSIS — R0602 Shortness of breath: Secondary | ICD-10-CM

## 2011-11-20 DIAGNOSIS — I4891 Unspecified atrial fibrillation: Secondary | ICD-10-CM

## 2011-11-20 DIAGNOSIS — I10 Essential (primary) hypertension: Secondary | ICD-10-CM

## 2011-11-20 MED ORDER — DILTIAZEM HCL ER 90 MG PO CP12: 90.0000 mg | ORAL_CAPSULE | Freq: Every day | ORAL | Status: DC

## 2011-11-20 NOTE — Progress Notes (Signed)
  Subjective:    Patient ID: [name], female    DOB: May 14, 1928, 78 y.o.   MRN: [id number]  HPI Here for one week of intermittent lightheadedness and fatigue. Some SOB but no chest pains or palpitations. No swelling in the feet. Her BP has been running low the past month, often getting systolics in the 80s or 90s. I see she had labs just a few months ago showing no anemia, no CHF, and no renal disease. She had been given a rx for Lasix in January, but she has actually only taken this once. It made her feel "wiped out" so she never tried it again.    Review of Systems  Constitutional: Positive for fatigue.  Respiratory: Negative for cough and wheezing.   Cardiovascular: Negative.        Objective:   Physical Exam  Constitutional: She appears well-developed and well-nourished.  Neck: No thyromegaly present.  Cardiovascular: Normal rate, normal heart sounds and intact distal pulses.  Exam reveals no gallop and no friction rub.   No murmur heard.      Irregular rhythm  Pulmonary/Chest: Effort normal. No respiratory distress. She has no rales.       Soft scattered wheezes  Musculoskeletal:       1+ edema in both feet  Lymphadenopathy:    She has no cervical adenopathy.          Assessment & Plan:  She seems to be having periods of hypotension. No signs of CHF, and her atrial fibrillation seems to have excellent rate control. Her weight is actually down 4 lbs from January. We will decrease the dose of Diltiazem to 90 mg a day, keep the Bisoprolol the same. She will follow up with Dr. Lovell Sheehan in 3 weeks as scheduled.

## 2011-12-10 ENCOUNTER — Ambulatory Visit: Payer: Medicare Other | Admitting: Internal Medicine

## 2011-12-11 ENCOUNTER — Encounter: Payer: Self-pay | Admitting: Internal Medicine

## 2011-12-11 ENCOUNTER — Ambulatory Visit (INDEPENDENT_AMBULATORY_CARE_PROVIDER_SITE_OTHER): Payer: Medicare Other | Admitting: Internal Medicine

## 2011-12-11 VITALS — BP 122/70 | HR 72 | Temp 98.2°F | Resp 16 | Ht 64.5 in | Wt 198.0 lb

## 2011-12-11 DIAGNOSIS — I10 Essential (primary) hypertension: Secondary | ICD-10-CM

## 2011-12-11 DIAGNOSIS — R21 Rash and other nonspecific skin eruption: Secondary | ICD-10-CM

## 2011-12-11 DIAGNOSIS — I959 Hypotension, unspecified: Secondary | ICD-10-CM

## 2011-12-11 DIAGNOSIS — I4891 Unspecified atrial fibrillation: Secondary | ICD-10-CM

## 2011-12-11 LAB — BASIC METABOLIC PANEL
BUN: 19 mg/dL (ref 6–23)
Calcium: 10.2 mg/dL (ref 8.4–10.5)
Creatinine, Ser: 1.3 mg/dL — ABNORMAL HIGH (ref 0.4–1.2)
GFR: 40.98 mL/min — ABNORMAL LOW (ref >60.00)

## 2011-12-11 LAB — POCT INR: INR: 2.4

## 2011-12-11 MED ORDER — CLOTRIMAZOLE-BETAMETHASONE 1-0.05 % EX LOTN: TOPICAL_LOTION | Freq: Two times a day (BID) | CUTANEOUS | Status: DC

## 2011-12-11 MED ORDER — DILTIAZEM HCL 30 MG PO TABS: 30.0000 mg | ORAL_TABLET | Freq: Three times a day (TID) | ORAL | Status: DC

## 2011-12-11 NOTE — Patient Instructions (Addendum)
The patient is instructed to continue all medications as prescribed. Schedule followup with check out clerk upon leaving the clinic  Stop the furosemide for now     Latest dosing instructions   Total Sun Mon Tue Wed Thu Fri Sat   52.5 7.5 mg 7.5 mg 7.5 mg 7.5 mg 7.5 mg 7.5 mg 7.5 mg    (5 mg1.5) (5 mg1.5) (5 mg1.5) (5 mg1.5) (5 mg1.5) (5 mg1.5) (5 mg1.5)

## 2011-12-11 NOTE — Progress Notes (Signed)
Subjective:    Patient ID: [name], female    DOB: 08/19/1927, 78 y.o.   MRN: [id number]  HPI  Has a yellow watery discharge after a bowel movement No interval discharge Has constipation No taking the probiotic. Had "runs" from the align COPD stable Hypotension improved with reduction of the diltiazem  Review of Systems  Constitutional: Negative for activity change, appetite change and fatigue.  HENT: Negative for ear pain, congestion, neck pain, postnasal drip and sinus pressure.   Eyes: Negative for redness and visual disturbance.  Respiratory: Negative for cough, shortness of breath and wheezing.   Gastrointestinal: Negative for abdominal pain and abdominal distention.  Genitourinary: Negative for dysuria, frequency and menstrual problem.  Musculoskeletal: Negative for myalgias, joint swelling and arthralgias.  Skin: Negative for rash and wound.  Neurological: Negative for dizziness, weakness and headaches.  Hematological: Negative for adenopathy. Does not bruise/bleed easily.  Psychiatric/Behavioral: Negative for sleep disturbance and decreased concentration.       Past Medical History  Diagnosis Date  . GERD (gastroesophageal reflux disease)   . Arthritis   . COPD (chronic obstructive pulmonary disease)   . Stroke   . History of TIAs   . Diverticulosis   . PUD (peptic ulcer disease)   . PVD (peripheral vascular disease)   . Hyperlipidemia   . Anxiety   . Depression   . Laryngeal carcinoma hx of ca    remotely with resection and xrt/chronic hoarsemenss    History   Social History  . Marital Status: Married    Spouse Name: N/A    Number of Children: N/A  . Years of Education: N/A   Occupational History  . retired    Social History Main Topics  . Smoking status: Former Games developer  . Smokeless tobacco: Never Used  . Alcohol Use: No  . Drug Use: No  . Sexually Active: Yes   Other Topics Concern  . Not on file   Social History Narrative  . No  narrative on file    Past Surgical History  Procedure Date  . Appendectomy     Family History  Problem Relation Age of Onset  . Cancer Sister     colon  . Hyperlipidemia Mother   . Stroke Mother   . Cancer Father     mesothelioma    Allergies  Allergen Reactions  . Propoxyphene Hcl   . Propoxyphene-Acetaminophen     REACTION: Reaction not known    Current Outpatient Prescriptions on File Prior to Visit  Medication Sig Dispense Refill  . ALPRAZolam (XANAX) 0.5 MG tablet Take 1 tablet (0.5 mg total) by mouth 3 (three) times daily as needed for sleep.  90 tablet  3  . bisoprolol (ZEBETA) 5 MG tablet TAKE ONE TABLET BY MOUTH EVERY DAY  30 tablet  11  . furosemide (LASIX) 20 MG tablet Take 1 tablet (20 mg total) by mouth daily.  30 tablet  3  . mupirocin ointment (BACTROBAN) 2 % Apply to affected area 3 times daily  15 g  0  . omeprazole (PRILOSEC) 40 MG capsule Take 40 mg by mouth daily as needed.        Marland Kitchen SPIRIVA HANDIHALER 18 MCG inhalation capsule INHALE ONE DOSE EVERY DAY  30 each  11  . Vitamin D, Ergocalciferol, (DRISDOL) 50000 UNITS CAPS TAKE ONE CAPSULE BY MOUTH ONCE A WEEK WITH FOOD  5 capsule  3  . warfarin (COUMADIN) 5 MG tablet Take 1.5 tablets (7.5 mg total)  by mouth daily.  60 tablet  11    BP 122/70  Pulse 72  Temp 98.2 F (36.8 C)  Resp 16  Ht 5' 4.5" (1.638 m)  Wt 198 lb (89.812 kg)  BMI 33.46 kg/m2    Objective:   Physical Exam  Nursing note and vitals reviewed. Constitutional: She is oriented to person, place, and time. She appears well-developed and well-nourished. No distress.  HENT:  Head: Normocephalic and atraumatic.  Right Ear: External ear normal.  Left Ear: External ear normal.  Nose: Nose normal.  Mouth/Throat: Oropharynx is clear and moist.  Eyes: Conjunctivae and EOM are normal. Pupils are equal, round, and reactive to light.  Neck: Normal range of motion. Neck supple. No JVD present. No tracheal deviation present. No thyromegaly  present.  Cardiovascular: Normal rate, regular rhythm, normal heart sounds and intact distal pulses.   No murmur heard. Pulmonary/Chest: Effort normal and breath sounds normal. She has no wheezes. She exhibits no tenderness.  Abdominal: Soft. Bowel sounds are normal.  Musculoskeletal: Normal range of motion. She exhibits no edema and no tenderness.  Lymphadenopathy:    She has no cervical adenopathy.  Neurological: She is alert and oriented to person, place, and time. She has normal reflexes. No cranial nerve deficit.  Skin: Skin is warm and dry. She is not diaphoretic.  Psychiatric: She has a normal mood and affect. Her behavior is normal.          Assessment & Plan:  Tight in the left knee.. with osteoarthritis the first injection at this point.  Hypertension stable.  Watery diarrhea secondary to history of diverticulitis and diverticulosis.  Monitor for signs and symptoms of infection and treat the infection as appropriate COPD stable

## 2011-12-28 ENCOUNTER — Other Ambulatory Visit: Payer: Self-pay | Admitting: Internal Medicine

## 2012-01-11 ENCOUNTER — Ambulatory Visit (INDEPENDENT_AMBULATORY_CARE_PROVIDER_SITE_OTHER): Payer: Medicare Other | Admitting: Family Medicine

## 2012-01-11 ENCOUNTER — Encounter: Payer: Self-pay | Admitting: Family Medicine

## 2012-01-11 ENCOUNTER — Telehealth: Payer: Self-pay | Admitting: Family Medicine

## 2012-01-11 VITALS — BP 120/70 | HR 88 | Temp 98.5°F | Wt 200.0 lb

## 2012-01-11 DIAGNOSIS — K625 Hemorrhage of anus and rectum: Secondary | ICD-10-CM

## 2012-01-11 LAB — CBC WITH DIFFERENTIAL/PLATELET
Basophils Absolute: 0 10*3/uL (ref 0.0–0.1)
Eosinophils Absolute: 0.2 10*3/uL (ref 0.0–0.7)
Lymphocytes Relative: 24.2 % (ref 12.0–46.0)
MCHC: 33.3 g/dL (ref 30.0–36.0)
Monocytes Relative: 14 % — ABNORMAL HIGH (ref 3.0–12.0)
Neutrophils Relative %: 57.3 % (ref 43.0–77.0)
RDW: 15.3 % — ABNORMAL HIGH (ref 11.5–14.6)

## 2012-01-11 LAB — BASIC METABOLIC PANEL
CO2: 29 mEq/L (ref 19–32)
Calcium: 9.3 mg/dL (ref 8.4–10.5)
Creatinine, Ser: 1.2 mg/dL (ref 0.4–1.2)
GFR: 45.3 mL/min — ABNORMAL LOW (ref >60.00)
Glucose, Bld: 93 mg/dL (ref 70–99)

## 2012-01-11 NOTE — Telephone Encounter (Signed)
Pulled from Triage vmail. Pt having rectal bleeding with BMs. Req appt. Called pt back and made appt today with Dr. Clent Ridges.

## 2012-01-11 NOTE — Progress Notes (Signed)
  Subjective:    Patient ID: [name], female    DOB: 03-13-1928, 78 y.o.   MRN: [id number]  HPI Here for 2 days of bleeding with BMs. This happened twice yesterday and twice today. The blood is bright red and mixed with the stool. No bleeding between BMs. Her BMs are not painful or hard to pass. She has had some mild lower abdominal cramps also but no nausea or fever. Her appetite is normal. She had a partial colectomy in 2010 due to diverticular disease. Her last CT scan was on 04-22-10 and this was normal except for an incisional hernia.    Review of Systems  Constitutional: Negative.   Respiratory: Negative.   Cardiovascular: Negative.   Gastrointestinal: Positive for abdominal pain and blood in stool. Negative for nausea, vomiting, diarrhea, constipation, abdominal distention and rectal pain.       Objective:   Physical Exam  Constitutional: She appears well-developed and well-nourished.  Abdominal: Soft. Bowel sounds are normal. She exhibits no distension and no mass. There is no rebound and no guarding.       Slightly tender in the LLQ  Genitourinary:       Rectal exam shows some external hemorrhoids with no visible bleeding. Digital exam reveals no masses or tenderness, no blood is seen.           Assessment & Plan:  This pattern of rectal bleeding suggests either internal hemorrhoids or diverticular bleeding. No signs of infection. We will get labs today and set up another CT scan. She will let us know if anything else develops.

## 2012-01-14 ENCOUNTER — Ambulatory Visit (INDEPENDENT_AMBULATORY_CARE_PROVIDER_SITE_OTHER)
Admission: RE | Admit: 2012-01-14 | Discharge: 2012-01-14 | Disposition: A | Discharge status: Home / Self Care | Payer: Medicare Other | Source: Ambulatory Visit | Attending: Family Medicine | Admitting: Family Medicine

## 2012-01-14 DIAGNOSIS — K625 Hemorrhage of anus and rectum: Secondary | ICD-10-CM

## 2012-01-14 MED ORDER — IOHEXOL 300 MG/ML  SOLN
100.0000 mL | Freq: Once | INTRAMUSCULAR | Status: AC | PRN
  Administered 2012-01-14: 100 mL via INTRAVENOUS

## 2012-01-15 ENCOUNTER — Telehealth: Payer: Self-pay | Admitting: Internal Medicine

## 2012-01-15 ENCOUNTER — Other Ambulatory Visit: Payer: Self-pay | Admitting: Internal Medicine

## 2012-01-15 DIAGNOSIS — R109 Unspecified abdominal pain: Secondary | ICD-10-CM

## 2012-01-15 NOTE — Telephone Encounter (Signed)
I spoke to Dr. Lovell Sheehan, and he has already set up these referrals

## 2012-01-15 NOTE — Telephone Encounter (Signed)
Per dr Lovell Sheehan- look like she has enlargement of lymph nodes and irregularity--needs referral to gi or her oncologist for further evaluation---FYI-----Dr Clent Ridges -we will call unless you want to.

## 2012-01-15 NOTE — Telephone Encounter (Signed)
Patient calling for CT results. 

## 2012-01-15 NOTE — Telephone Encounter (Signed)
Patient calling to get the results of her CT scan.  PLEASE CALL [phone].

## 2012-01-18 ENCOUNTER — Encounter: Payer: Self-pay | Admitting: Gastroenterology

## 2012-01-18 NOTE — Telephone Encounter (Signed)
Error

## 2012-01-19 ENCOUNTER — Encounter: Payer: Self-pay | Admitting: Gastroenterology

## 2012-01-19 ENCOUNTER — Ambulatory Visit (INDEPENDENT_AMBULATORY_CARE_PROVIDER_SITE_OTHER): Payer: Medicare Other | Admitting: Gastroenterology

## 2012-01-19 VITALS — BP 106/60 | HR 64 | Ht 64.5 in | Wt 198.6 lb

## 2012-01-19 DIAGNOSIS — R933 Abnormal findings on diagnostic imaging of other parts of digestive tract: Secondary | ICD-10-CM

## 2012-01-19 DIAGNOSIS — K625 Hemorrhage of anus and rectum: Secondary | ICD-10-CM

## 2012-01-19 DIAGNOSIS — Z800 Family history of malignant neoplasm of digestive organs: Secondary | ICD-10-CM | POA: Insufficient documentation

## 2012-01-19 MED ORDER — MOVIPREP 100 G PO SOLR: 1.0000 | Freq: Once | ORAL | Status: DC

## 2012-01-19 NOTE — Patient Instructions (Addendum)
You will hold coumadin 5 days before your procedure per Dr Arlyce Dice  Colonoscopy A colonoscopy is an exam to evaluate your entire colon. In this exam, your colon is cleansed. A long fiberoptic tube is inserted through your rectum and into your colon. The fiberoptic scope (endoscope) is a long bundle of enclosed and very flexible fibers. These fibers transmit light to the area examined and send images from that area to your caregiver. Discomfort is usually minimal. You may be given a drug to help you sleep (sedative) during or prior to the procedure. This exam helps to detect lumps (tumors), polyps, inflammation, and areas of bleeding. Your caregiver may also take a small piece of tissue (biopsy) that will be examined under a microscope. LET YOUR CAREGIVER KNOW ABOUT:   Allergies to food or medicine.   Medicines taken, including vitamins, herbs, eyedrops, over-the-counter medicines, and creams.   Use of steroids (by mouth or creams).   Previous problems with anesthetics or numbing medicines.   History of bleeding problems or blood clots.   Previous surgery.   Other health problems, including diabetes and kidney problems.   Possibility of pregnancy, if this applies.  BEFORE THE PROCEDURE   A clear liquid diet may be required for 2 days before the exam.   Ask your caregiver about changing or stopping your regular medications.   Liquid injections (enemas) or laxatives may be required.   A large amount of electrolyte solution may be given to you to drink over a short period of time. This solution is used to clean out your colon.   You should be present 60 minutes prior to your procedure or as directed by your caregiver.  AFTER THE PROCEDURE   If you received a sedative or pain relieving medication, you will need to arrange for someone to drive you home.   Occasionally, there is a little blood passed with the first bowel movement. Do not be concerned.  FINDING OUT THE RESULTS OF YOUR  TEST Not all test results are available during your visit. If your test results are not back during the visit, make an appointment with your caregiver to find out the results. Do not assume everything is normal if you have not heard from your caregiver or the medical facility. It is important for you to follow up on all of your test results. HOME CARE INSTRUCTIONS   It is not unusual to pass moderate amounts of gas and experience mild abdominal cramping following the procedure. This is due to air being used to inflate your colon during the exam. Walking or a warm pack on your belly (abdomen) may help.   You may resume all normal meals and activities after sedatives and medicines have worn off.   Only take over-the-counter or prescription medicines for pain, discomfort, or fever as directed by your caregiver. Do not use aspirin or blood thinners if a biopsy was taken. Consult your caregiver for medicine usage if biopsies were taken.  SEEK IMMEDIATE MEDICAL CARE IF:   You have a fever.   You pass large blood clots or fill a toilet with blood following the procedure. This may also occur 10 to 14 days following the procedure. This is more likely if a biopsy was taken.   You develop abdominal pain that keeps getting worse and cannot be relieved with medicine.  Document Released: 07/17/2000 Document Revised: 07/09/2011 Document Reviewed: 03/01/2008 [facility] Patient Information 2012 [place], [place].

## 2012-01-19 NOTE — Assessment & Plan Note (Signed)
Limited rectal bleeding may be do to hemorrhoids. A more proximal colonic bleeding source should be ruled out, especially in view of her family history of colon cancer and lymphadenopathy seen on CT scan.  Recommendations #1 colonoscopy

## 2012-01-19 NOTE — Progress Notes (Signed)
History of Present Illness: [name] is a pleasant 78 year old white female referred at the request of Dr. Lovell Sheehan for evaluation of rectal bleeding. She has a history of TIAs and is on Coumadin. Recently she's had several episodes of painless bright red blood per rectum consisting of blood in the toilet water. He denies change in bowel habits, abdominal or rectal pain. A CT scan, which I reviewed, demonstrated pathologically enlarged lymph nodes in the pelvis and aortic areas. A questionable kidney mass was raised. There were no obvious colonic abnormalities.  Family history is pertinent for a sister who had colon cancer in her 38s.  Approximately 3 years ago she underwent resection of the left colon for perforated diverticulitis.    Past Medical History  Diagnosis Date  . GERD (gastroesophageal reflux disease)   . Arthritis   . COPD (chronic obstructive pulmonary disease)   . Stroke   . History of TIAs   . Diverticulosis   . PUD (peptic ulcer disease)   . PVD (peripheral vascular disease)   . Hyperlipidemia   . Anxiety   . Depression   . Laryngeal carcinoma hx of ca    remotely with resection and xrt/chronic hoarsemenss   Past Surgical History  Procedure Date  . Appendectomy   . Colon surgery   . Hernia repair   . Tubal ligation   . Throat surgery 1986   family history includes Cancer in her father; Colon cancer in her sister; Hyperlipidemia in her mother; and Stroke in her mother. Current Outpatient Prescriptions  Medication Sig Dispense Refill  . ALPRAZolam (XANAX) 0.5 MG tablet Take 1 tablet (0.5 mg total) by mouth 3 (three) times daily as needed for sleep.  90 tablet  3  . bisoprolol (ZEBETA) 5 MG tablet TAKE ONE TABLET BY MOUTH EVERY DAY  30 tablet  11  . diltiazem (CARDIZEM) 30 MG tablet Take 1 tablet (30 mg total) by mouth 3 (three) times daily.  90 tablet  11  . furosemide (LASIX) 20 MG tablet Take 20 mg by mouth as needed.      . mupirocin ointment (BACTROBAN) 2 %  Apply to affected area 3 times daily  15 g  0  . omeprazole (PRILOSEC) 40 MG capsule Take 40 mg by mouth daily as needed.        Marland Kitchen SPIRIVA HANDIHALER 18 MCG inhalation capsule INHALE ONE DOSE EVERY DAY  30 each  11  . Vitamin D, Ergocalciferol, (DRISDOL) 50000 UNITS CAPS TAKE ONE CAPSULE BY MOUTH ONCE A WEEK WITH FOOD  5 capsule  3  . warfarin (COUMADIN) 5 MG tablet TAKE ONE & ONE-HALF TABLETS BY MOUTH EVERY DAY  60 tablet  11   Allergies as of 01/19/2012 - Review Complete 01/19/2012  Allergen Reaction Noted  . Propoxyphene hcl    . Propoxyphene-acetaminophen      reports that she has quit smoking. She has never used smokeless tobacco. She reports that she does not drink alcohol or use illicit drugs.     Review of Systems: She has permanent hoarseness related to laryngeal surgery. She complains of joint pains. Pertinent positive and negative review of systems were noted in the above HPI section. All other review of systems were otherwise negative.  Vital signs were reviewed in today's medical record Physical Exam: General: Well developed , well nourished, no acute distress Head: Normocephalic and atraumatic Eyes:  sclerae anicteric, EOMI Ears: Normal auditory acuity Mouth: No deformity or lesions Neck: Supple, no masses or thyromegaly  Lungs: Clear throughout to auscultation Heart: Regular rate and rhythm; no murmurs, rubs or bruits Abdomen: Soft, non tender and non distended. No masses, hepatosplenomegaly or hernias noted. Normal Bowel sounds Rectal: External hemorrhoids are present. There are no masses. Stools Hemoccult negative Musculoskeletal: Symmetrical with no gross deformities  Skin: No lesions on visible extremities Pulses:  Normal pulses noted Extremities: No clubbing, cyanosis, edema or deformities noted Neurological: Alert oriented x 4, grossly nonfocal Cervical Nodes:  No significant cervical adenopathy Inguinal Nodes: No significant inguinal adenopathy Psychological:   Alert and cooperative. Normal mood and affect

## 2012-01-19 NOTE — Assessment & Plan Note (Signed)
Pathologic lymph nodes are seen by CT scan suggesting metastatic  disease or lymphoma  Recommendations #1 colonoscopy

## 2012-01-20 ENCOUNTER — Telehealth: Payer: Self-pay | Admitting: Internal Medicine

## 2012-01-20 NOTE — Telephone Encounter (Signed)
Ok to hold coumadin for 5 days prior toc colonoscopy per dr Haskell Flirt informed

## 2012-01-20 NOTE — Telephone Encounter (Signed)
Pt called req permission to go off of coumadin for 5 days re: colonoscopy on 01/28/12. Pls call.

## 2012-01-28 ENCOUNTER — Encounter: Payer: Self-pay | Admitting: Gastroenterology

## 2012-01-28 ENCOUNTER — Ambulatory Visit (AMBULATORY_SURGERY_CENTER): Payer: Medicare Other | Admitting: Gastroenterology

## 2012-01-28 VITALS — BP 153/93 | HR 76 | Temp 99.1°F | Resp 23 | Ht 65.0 in | Wt 198.0 lb

## 2012-01-28 DIAGNOSIS — K625 Hemorrhage of anus and rectum: Secondary | ICD-10-CM

## 2012-01-28 DIAGNOSIS — K5730 Diverticulosis of large intestine without perforation or abscess without bleeding: Secondary | ICD-10-CM

## 2012-01-28 DIAGNOSIS — D126 Benign neoplasm of colon, unspecified: Secondary | ICD-10-CM

## 2012-01-28 MED ORDER — SODIUM CHLORIDE 0.9 % IV SOLN: 500.0000 mL | INTRAVENOUS | Status: DC

## 2012-01-28 NOTE — Patient Instructions (Addendum)
YOU HAD AN ENDOSCOPIC PROCEDURE TODAY AT THE [facility]: Refer to the procedure report that was given to you for any specific questions about what was found during the examination.  If the procedure report does not answer your questions, please call your gastroenterologist to clarify.  If you requested that your care partner not be given the details of your procedure findings, then the procedure report has been included in a sealed envelope for you to review at your convenience later.  YOU SHOULD EXPECT: Some feelings of bloating in the abdomen. Passage of more gas than usual.  Walking can help get rid of the air that was put into your GI tract during the procedure and reduce the bloating. If you had a lower endoscopy (such as a colonoscopy or flexible sigmoidoscopy) you may notice spotting of blood in your stool or on the toilet paper. If you underwent a bowel prep for your procedure, then you may not have a normal bowel movement for a few days.  DIET: Your first meal following the procedure should be a light meal and then it is ok to progress to your normal diet.  A half-sandwich or bowl of soup is an example of a good first meal.  Heavy or fried foods are harder to digest and may make you feel nauseous or bloated.  Likewise meals heavy in dairy and vegetables can cause extra gas to form and this can also increase the bloating.  Drink plenty of fluids but you should avoid alcoholic beverages for 24 hours.  ACTIVITY: Your care partner should take you home directly after the procedure.  You should plan to take it easy, moving slowly for the rest of the day.  You can resume normal activity the day after the procedure however you should NOT DRIVE or use heavy machinery for 24 hours (because of the sedation medicines used during the test).    SYMPTOMS TO REPORT IMMEDIATELY: A gastroenterologist can be reached at any hour.  During normal business hours, 8:30 AM to 5:00 PM Monday through Friday,  call [phone].  After hours and on weekends, please call the GI answering service at [phone] who will take a message and have the physician on call contact you.   Following lower endoscopy (colonoscopy or flexible sigmoidoscopy):  Excessive amounts of blood in the stool  Significant tenderness or worsening of abdominal pains  Swelling of the abdomen that is new, acute  Fever of 100F or higher    FOLLOW UP: If any biopsies were taken you will be contacted by phone or by letter within the next 1-3 weeks.  Call your gastroenterologist if you have not heard about the biopsies in 3 weeks.  Our staff will call the home number listed on your records the next business day following your procedure to check on you and address any questions or concerns that you may have at that time regarding the information given to you following your procedure. This is a courtesy call and so if there is no answer at the home number and we have not heard from you through the emergency physician on call, we will assume that you have returned to your regular daily activities without incident.  SIGNATURES/CONFIDENTIALITY: You and/or your care partner have signed paperwork which will be entered into your electronic medical record.  These signatures attest to the fact that that the information above on your After Visit Summary has been reviewed and is understood.  Full responsibility of the confidentiality   of this discharge information lies with you and/or your care-partner.     

## 2012-01-28 NOTE — Op Note (Addendum)
[facility] [address] [place], [place]  [address]  COLONOSCOPY PROCEDURE REPORT  PATIENT:  Lisa, [name]  MR#:  [id number] BIRTHDATE:  1927/12/17, 83 yrs. old  GENDER:  female ENDOSCOPIST:  Barbette Hair. Arlyce Dice, MD REF. BY:  Stacie Glaze, M.D. PROCEDURE DATE:  01/28/2012 PROCEDURE:  Colonoscopy with biopsy ASA CLASS:  Class II INDICATIONS:  rectal bleeding, Abnormal CT of abdomen MEDICATIONS:   MAC sedation, administered by CRNA propofol 120mg IV  DESCRIPTION OF PROCEDURE:   After the risks benefits and alternatives of the procedure were thoroughly explained, informed consent was obtained.  Digital rectal exam was performed and revealed external hemorrhoids.   The LB [id number] endoscope was introduced through the anus and advanced to the cecum, which was identified by both the appendix and ileocecal valve, without limitations.  The quality of the prep was excellent, using MiraLax.  The instrument was then slowly withdrawn as the colon was fully examined. <<PROCEDUREIMAGES>>  FINDINGS:  Moderate diverticulosis was found in the sigmoid colon (see image4).  Colitis was found in the sigmoid colon. Mild segmental colitis in area of diverticular disease characterized by mild mucosal edema. Bxs taken (see image6).  This was otherwise a normal examination of the colon (see image1 and image8). Retroflexed views in the rectum revealed no abnormalities.    The time to cecum =  1) 1.25  minutes. The scope was then withdrawn in 1) 7.75  minutes from the cecum and the procedure completed. COMPLICATIONS:  None ENDOSCOPIC IMPRESSION: 1) Moderate diverticulosis in the sigmoid colon 2) Colitis in the sigmoid colon 3) Otherwise normal examination  Rectal bleeding secondary to hemorrhoids  RECOMMENDATIONS: 1) Await biopsy results 2) EGD REPEAT EXAM:  No (age)  ______________________________ Barbette Hair. Arlyce Dice, MD  CC:  n. REVISED:  01/28/2012 10:35 AM eSIGNED:    Barbette Hair. Yerachmiel Spinney at 01/28/2012 10:35 AM  [name], [phone]

## 2012-01-28 NOTE — Progress Notes (Signed)
Patient did not have preoperative order for IV antibiotic SSI prophylaxis. (G8918)  Patient did not experience any of the following events: a burn prior to discharge; a fall within the facility; wrong site/side/patient/procedure/implant event; or a hospital transfer or hospital admission upon discharge from the facility. (G8907)  

## 2012-01-29 ENCOUNTER — Telehealth: Payer: Self-pay

## 2012-01-29 NOTE — Telephone Encounter (Signed)
  Follow up Call-  Call back number 01/28/2012  Post procedure Call Back phone  # [phone]  Permission to leave phone message Yes     Patient questions:  Do you have a fever, pain , or abdominal swelling? no Pain Score  0 *  Have you tolerated food without any problems? yes  Have you been able to return to your normal activities? yes  Do you have any questions about your discharge instructions: Diet   no Medications  no Follow up visit  no  Do you have questions or concerns about your Care? no  Actions: * If pain score is 4 or above: No action needed, pain <4.

## 2012-02-02 ENCOUNTER — Other Ambulatory Visit: Payer: Self-pay | Admitting: Internal Medicine

## 2012-02-03 ENCOUNTER — Encounter: Payer: Self-pay | Admitting: Gastroenterology

## 2012-02-03 ENCOUNTER — Telehealth: Payer: Self-pay | Admitting: *Deleted

## 2012-02-03 ENCOUNTER — Ambulatory Visit (AMBULATORY_SURGERY_CENTER): Payer: Medicare Other | Admitting: *Deleted

## 2012-02-03 VITALS — Ht 64.0 in | Wt 199.2 lb

## 2012-02-03 DIAGNOSIS — R933 Abnormal findings on diagnostic imaging of other parts of digestive tract: Secondary | ICD-10-CM

## 2012-02-03 NOTE — Telephone Encounter (Signed)
Per dr Lovell Sheehan- may hold coumadin 5 days prior to endo

## 2012-02-03 NOTE — Telephone Encounter (Signed)
Patient aware ok to hold coumadin

## 2012-02-03 NOTE — Progress Notes (Signed)
Pt. Informed to bring inhaler with her to procedure

## 2012-02-03 NOTE — Telephone Encounter (Signed)
May hold coumadin for 5 days prior per dr Lovell Sheehan

## 2012-02-03 NOTE — Telephone Encounter (Signed)
[facility] [address] [place]  [address] [phone] phone [phone] fax   02/03/2012    RE: [name] DOB: Sep 23, 1927 MRN: [id number]   Dear  Peri Jefferson    We have scheduled the above patient for an endoscopic procedure. Our records show that she is on anticoagulation therapy.   Please advise as to how long the patient may come off her therapy of Coumadin prior to the procedure, which is scheduled for 02/11/2012 Please fax back/ or route the completed form to Chiniqua Kilcrease at  [phone]  Sincerely,  Merri Ray

## 2012-02-08 ENCOUNTER — Encounter: Payer: Self-pay | Admitting: Gastroenterology

## 2012-02-11 ENCOUNTER — Other Ambulatory Visit: Payer: Self-pay | Admitting: Gastroenterology

## 2012-02-11 ENCOUNTER — Encounter: Payer: Self-pay | Admitting: Gastroenterology

## 2012-02-11 ENCOUNTER — Ambulatory Visit (AMBULATORY_SURGERY_CENTER): Payer: Medicare Other | Admitting: Gastroenterology

## 2012-02-11 ENCOUNTER — Telehealth: Payer: Self-pay | Admitting: Internal Medicine

## 2012-02-11 VITALS — BP 148/90 | HR 75 | Temp 98.4°F | Resp 20 | Ht 64.0 in | Wt 199.0 lb

## 2012-02-11 DIAGNOSIS — N2889 Other specified disorders of kidney and ureter: Secondary | ICD-10-CM

## 2012-02-11 DIAGNOSIS — K31819 Angiodysplasia of stomach and duodenum without bleeding: Secondary | ICD-10-CM

## 2012-02-11 DIAGNOSIS — R933 Abnormal findings on diagnostic imaging of other parts of digestive tract: Secondary | ICD-10-CM

## 2012-02-11 DIAGNOSIS — Q2733 Arteriovenous malformation of digestive system vessel: Secondary | ICD-10-CM

## 2012-02-11 MED ORDER — SODIUM CHLORIDE 0.9 % IV SOLN: 500.0000 mL | INTRAVENOUS | Status: DC

## 2012-02-11 NOTE — Patient Instructions (Addendum)
Continue to hold coumadin.   Dr Nita Sells nurse to schedule CT guided biopsy of lymph node.  YOU HAD AN ENDOSCOPIC PROCEDURE TODAY AT THE [facility]: Refer to the procedure report that was given to you for any specific questions about what was found during the examination.  If the procedure report does not answer your questions, please call your gastroenterologist to clarify.  If you requested that your care partner not be given the details of your procedure findings, then the procedure report has been included in a sealed envelope for you to review at your convenience later.  YOU SHOULD EXPECT: Some feelings of bloating in the abdomen. Passage of more gas than usual.  Walking can help get rid of the air that was put into your GI tract during the procedure and reduce the bloating. If you had a lower endoscopy (such as a colonoscopy or flexible sigmoidoscopy) you may notice spotting of blood in your stool or on the toilet paper. If you underwent a bowel prep for your procedure, then you may not have a normal bowel movement for a few days.  DIET: Your first meal following the procedure should be a light meal and then it is ok to progress to your normal diet.  A half-sandwich or bowl of soup is an example of a good first meal.  Heavy or fried foods are harder to digest and may make you feel nauseous or bloated.  Likewise meals heavy in dairy and vegetables can cause extra gas to form and this can also increase the bloating.  Drink plenty of fluids but you should avoid alcoholic beverages for 24 hours.  ACTIVITY: Your care partner should take you home directly after the procedure.  You should plan to take it easy, moving slowly for the rest of the day.  You can resume normal activity the day after the procedure however you should NOT DRIVE or use heavy machinery for 24 hours (because of the sedation medicines used during the test).    SYMPTOMS TO REPORT IMMEDIATELY: A gastroenterologist can be  reached at any hour.  During normal business hours, 8:30 AM to 5:00 PM Monday through Friday, call [phone].  After hours and on weekends, please call the GI answering service at [phone] who will take a message and have the physician on call contact you.   Following lower endoscopy (colonoscopy or flexible sigmoidoscopy):  Excessive amounts of blood in the stool  Significant tenderness or worsening of abdominal pains  Swelling of the abdomen that is new, acute  Fever of 100F or higher  Following upper endoscopy (EGD)  Vomiting of blood or coffee ground material  New chest pain or pain under the shoulder blades  Painful or persistently difficult swallowing  New shortness of breath  Fever of 100F or higher  Black, tarry-looking stools  FOLLOW UP: If any biopsies were taken you will be contacted by phone or by letter within the next 1-3 weeks.  Call your gastroenterologist if you have not heard about the biopsies in 3 weeks.  Our staff will call the home number listed on your records the next business day following your procedure to check on you and address any questions or concerns that you may have at that time regarding the information given to you following your procedure. This is a courtesy call and so if there is no answer at the home number and we have not heard from you through the emergency physician on call, we will  assume that you have returned to your regular daily activities without incident.  SIGNATURES/CONFIDENTIALITY: You and/or your care partner have signed paperwork which will be entered into your electronic medical record.  These signatures attest to the fact that that the information above on your After Visit Summary has been reviewed and is understood.  Full responsibility of the confidentiality of this discharge information lies with you and/or your care-partner.   Please follow all discharge instructions given to you by the recovery room nurse. If you have  any questions or problems after discharge please call one of the numbers listed above. You will receive a phone call in the am to see how you are doing and answer any questions you may have. Thank you for choosing [facility] for your health care needs.

## 2012-02-11 NOTE — Telephone Encounter (Signed)
Called to request copy of CT scan of abdomen 01/11/11 and lab results for past 2 months (May and June 2013).  Has appt with radiologist for biopsy at 0930 02/12/12.  Would like to drop into office either this afternoon 02/11/12 or early AM 02/12/12 to pick up reports.   Please call cell [phone] to advise when ready for pick up.

## 2012-02-11 NOTE — Op Note (Signed)
[name] [address] [name], [place]  [address]  ENDOSCOPY PROCEDURE REPORT  PATIENT:  [name], [name]  MR#:  [id number] BIRTHDATE:  1928/03/25, 83 yrs. old  GENDER:  female  ENDOSCOPIST:  Barbette Hair. Arlyce Dice, MD Referred by:  PROCEDURE DATE:  02/11/2012 PROCEDURE:  EGD, diagnostic 43235 ASA CLASS:  Class II INDICATIONS:  abnormal imaging Pathologic lymph nodes  MEDICATIONS:   MAC sedation, administered by CRNA propofol 80mg IV, glycopyrrolate (Robinal) 0.2 mg IV, 0.6cc simethancone 0.6 cc PO TOPICAL ANESTHETIC:  DESCRIPTION OF PROCEDURE:   After the risks and benefits of the procedure were explained, informed consent was obtained.  The [facility] GIF-H180 [id number] endoscope was introduced through the mouth and advanced to the third portion of the duodenum.  The instrument was slowly withdrawn as the mucosa was fully examined. <<PROCEDUREIMAGES>>  AVM in the body of the stomach (see image2). 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 2mm AVM  Otherwise the examination was normal (see image1 and image3).    Retroflexed views revealed no abnormalities.    The scope was then withdrawn from the patient and the procedure completed.  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 Arlyce Dice, MD  CC:  Stacie Glaze, MD  n. Rosalie Doctor:   Barbette Hair. Crystalee Ventress at 02/11/2012 08:30 AM  [name], [phone]

## 2012-02-11 NOTE — Progress Notes (Signed)
Patient did not experience any of the following events: a burn prior to discharge; a fall within the facility; wrong site/side/patient/procedure/implant event; or a hospital transfer or hospital admission upon discharge from the facility. (G8907) Patient did not have preoperative order for IV antibiotic SSI prophylaxis. (G8918)  

## 2012-02-11 NOTE — Progress Notes (Signed)
Propofol given and oxygen managed per S Camp CRNA 

## 2012-02-12 ENCOUNTER — Telehealth: Payer: Self-pay

## 2012-02-12 ENCOUNTER — Other Ambulatory Visit: Payer: Self-pay | Admitting: Radiology

## 2012-02-12 ENCOUNTER — Inpatient Hospital Stay (HOSPITAL_COMMUNITY)
Admission: RE | Admit: 2012-02-12 | Discharge: 2012-02-12 | Disposition: A | Discharge status: Home / Self Care | Payer: Medicare Other | Source: Ambulatory Visit

## 2012-02-12 ENCOUNTER — Encounter (HOSPITAL_COMMUNITY): Payer: Self-pay

## 2012-02-12 ENCOUNTER — Ambulatory Visit (HOSPITAL_COMMUNITY)
Admission: RE | Admit: 2012-02-12 | Discharge: 2012-02-12 | Disposition: A | Discharge status: Home / Self Care | Payer: Medicare Other | Source: Ambulatory Visit | Attending: Gastroenterology | Admitting: Gastroenterology

## 2012-02-12 DIAGNOSIS — N289 Disorder of kidney and ureter, unspecified: Secondary | ICD-10-CM | POA: Insufficient documentation

## 2012-02-12 DIAGNOSIS — R599 Enlarged lymph nodes, unspecified: Secondary | ICD-10-CM | POA: Insufficient documentation

## 2012-02-12 DIAGNOSIS — N2889 Other specified disorders of kidney and ureter: Secondary | ICD-10-CM

## 2012-02-12 LAB — PROTIME-INR: Prothrombin Time: 13 seconds (ref 11.6–15.2)

## 2012-02-12 LAB — CBC
HCT: 42.3 % (ref 36.0–46.0)
Hemoglobin: 13.6 g/dL (ref 12.0–15.0)
MCH: 27.8 pg (ref 26.0–34.0)
RBC: 4.89 MIL/uL (ref 3.87–5.11)

## 2012-02-12 MED ORDER — MIDAZOLAM HCL 2 MG/2ML IJ SOLN
INTRAMUSCULAR | Status: AC
  Filled 2012-02-12: qty 6

## 2012-02-12 MED ORDER — FENTANYL CITRATE 0.05 MG/ML IJ SOLN
INTRAMUSCULAR | Status: AC
  Filled 2012-02-12: qty 2

## 2012-02-12 MED ORDER — FENTANYL CITRATE 0.05 MG/ML IJ SOLN
INTRAMUSCULAR | Status: AC
  Filled 2012-02-12: qty 4

## 2012-02-12 MED ORDER — MIDAZOLAM HCL 5 MG/5ML IJ SOLN
INTRAMUSCULAR | Status: AC | PRN
  Administered 2012-02-12 (×3): 1 mg via INTRAVENOUS
  Administered 2012-02-12: 2 mg via INTRAVENOUS

## 2012-02-12 MED ORDER — SODIUM CHLORIDE 0.9 % IV SOLN
INTRAVENOUS | Status: DC
  Administered 2012-02-12: 10:00:00 via INTRAVENOUS

## 2012-02-12 MED ORDER — FENTANYL CITRATE 0.05 MG/ML IJ SOLN
INTRAMUSCULAR | Status: AC | PRN
  Administered 2012-02-12: 50 ug via INTRAVENOUS
  Administered 2012-02-12: 100 ug via INTRAVENOUS
  Administered 2012-02-12 (×2): 50 ug via INTRAVENOUS

## 2012-02-12 NOTE — Telephone Encounter (Signed)
  Follow up Call-  Call back number 02/11/2012 01/28/2012  Post procedure Call Back phone  # [phone]  Permission to leave phone message Yes Yes     Patient questions:  Do you have a fever, pain , or abdominal swelling? no Pain Score  0 *  Have you tolerated food without any problems? yes  Have you been able to return to your normal activities? yes  Do you have any questions about your discharge instructions: Diet   no Medications  no Follow up visit  no  Do you have questions or concerns about your Care? no  Actions: * If pain score is 4 or above: No action needed, pain <4.

## 2012-02-12 NOTE — Procedures (Signed)
Procedure:  CT guided core biopsy of right iliac lymph node Findings:  18 G core biopsy x 5 via 17 G needle of right iliac lymph node.  Solid tissue obtained.  No complications.

## 2012-02-12 NOTE — ED Notes (Signed)
Pt repositioned L lateral

## 2012-02-12 NOTE — H&P (Signed)
Agree 

## 2012-02-12 NOTE — H&P (Signed)
Chief Complaint: Pelvic lymphadenopathy Referring Physician:Kaplan HPI: [name] is an 78 y.o. female who is referred for LN biopsy. She had developed some GI bleeding sysptoms and has been worked up for that. She has had essentially negative upper and lower endoscopies. She also had a CT abd which showed abdominal and pelvic enlarged LN, as well as a Rt renal lesion. After her endoscopy yesterday, Dr. Arlyce Dice recommended biopsy while she is off her Coumadin.  Past Medical History:  Past Medical History  Diagnosis Date  . GERD (gastroesophageal reflux disease)   . Arthritis   . Stroke 10/2002    no residual  . History of TIAs   . Diverticulosis   . PUD (peptic ulcer disease)   . PVD (peripheral vascular disease)   . Hyperlipidemia   . Anxiety   . Depression   . 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 Blood transfusion   . COPD (chronic obstructive pulmonary disease)     Past Surgical History:  Past Surgical History  Procedure Date  . Appendectomy   . Colon surgery 09/13/2008    iliostomy for diverticulitis  . Hernia repair   . Tubal ligation   . Throat surgery 1986  . Total knee arthroplasty     2002 left 2008 right  . Reversal of iliostomy 01/2010  . Cataract extraction 2005    bilateral  . Carotid surgery     2004 a month apart right and left    Family History:  Family History  Problem Relation Age of Onset  . Colon cancer Sister     colon  . Hyperlipidemia Mother   . Stroke Mother   . Cancer Father     mesothelioma    Social History:  reports that she has quit smoking. She has never used smokeless tobacco. She reports that she does not drink alcohol or use illicit drugs.  Allergies:  Allergies  Allergen Reactions  . Propoxyphene Hcl   . Propoxyphene-Acetaminophen     REACTION: Reaction not known    Medications: Current Outpatient Prescriptions   Medication  Sig  Dispense  Refill   .  ALPRAZolam  (XANAX) 0.5 MG tablet  Take 1 tablet (0.5 mg total) by mouth 3 (three) times daily as needed for sleep.  90 tablet  3   .  bisoprolol (ZEBETA) 5 MG tablet  TAKE ONE TABLET BY MOUTH EVERY DAY  30 tablet  11   .  diltiazem (CARDIZEM) 30 MG tablet  Take 1 tablet (30 mg total) by mouth 3 (three) times daily.  90 tablet  11   .  furosemide (LASIX) 20 MG tablet  Take 20 mg by mouth as needed.     .  mupirocin ointment (BACTROBAN) 2 %  Apply to affected area 3 times daily  15 g  0   .  omeprazole (PRILOSEC) 40 MG capsule  Take 40 mg by mouth daily as needed.     Marland Kitchen  SPIRIVA HANDIHALER 18 MCG inhalation capsule  INHALE ONE DOSE EVERY DAY  30 each  11   .  Vitamin D, Ergocalciferol, (DRISDOL) 50000 UNITS CAPS  TAKE ONE CAPSULE BY MOUTH ONCE A WEEK WITH FOOD  5 capsule  3   .  warfarin (COUMADIN) 5 MG tablet  TAKE ONE & ONE-HALF TABLETS BY MOUTH EVERY DAY  60 tablet  11   *Currently being held*   Please HPI for pertinent positives, otherwise complete  10 system ROS negative.  Physical Exam: Blood pressure 135/83, pulse 71, temperature 97.8 F (36.6 C), temperature source Oral, resp. rate 18, height 5' 4.5" (1.638 m), weight 197 lb (89.359 kg), SpO2 94.00%. Body mass index is 33.29 kg/(m^2).   General Appearance:  Alert, cooperative, no distress, appears stated age  Head:  Normocephalic, without obvious abnormality, atraumatic  ENT: Unremarkable  Neck: Supple, symmetrical, trachea midline, no adenopathy, thyroid: not enlarged, symmetric, no tenderness/mass/nodules  Lungs:   Clear to auscultation bilaterally, no w/r/r, respirations unlabored without use of accessory muscles.  Chest Wall:  No tenderness or deformity  Heart:  Regular rate and rhythm, S1, S2 normal, no murmur, rub or gallop. Carotids 2+ without bruit.  Abdomen:   Soft, non-tender, non distended. Bowel sounds active all four quadrants,  no masses, no organomegaly.  Extremities: Extremities normal, atraumatic, no cyanosis or edema    Neurologic: Normal affect, no gross deficits.   Results for orders placed during the hospital encounter of 02/12/12 (from the past 48 hour(s))  CBC     Status: Abnormal   Collection Time   02/12/12 10:10 AM      Component Value Range Comment   WBC 3.9 (*) 4.0 - 10.5 K/uL    RBC 4.89  3.87 - 5.11 MIL/uL    Hemoglobin 13.6  12.0 - 15.0 g/dL    HCT 30.8  65.7 - 84.6 %    MCV 86.5  78.0 - 100.0 fL    MCH 27.8  26.0 - 34.0 pg    MCHC 32.2  30.0 - 36.0 g/dL    RDW 96.2  95.2 - 84.1 %    Platelets 189  150 - 400 K/uL    No results found.  Assessment/Plan Pelvic LAD Coags pending. IR MD reviewed imaging and feels amenable to CT biopsy, though likely via transgluteal approach. Discussed procedure including risks and complications. Consent signed in chart.  Brayton El PA-C 02/12/2012, 10:49 AM

## 2012-02-16 ENCOUNTER — Encounter (INDEPENDENT_AMBULATORY_CARE_PROVIDER_SITE_OTHER): Payer: Medicare Other | Admitting: Internal Medicine

## 2012-02-16 VITALS — BP 130/80 | HR 76 | Temp 98.2°F | Resp 16 | Ht 64.5 in | Wt 198.0 lb

## 2012-02-17 ENCOUNTER — Ambulatory Visit (INDEPENDENT_AMBULATORY_CARE_PROVIDER_SITE_OTHER): Payer: Medicare Other | Admitting: Internal Medicine

## 2012-02-17 VITALS — BP 130/80 | HR 76 | Temp 98.2°F | Resp 18 | Ht 64.5 in | Wt 198.0 lb

## 2012-02-17 DIAGNOSIS — C8510 Unspecified B-cell lymphoma, unspecified site: Secondary | ICD-10-CM

## 2012-02-17 DIAGNOSIS — C8589 Other specified types of non-Hodgkin lymphoma, extranodal and solid organ sites: Secondary | ICD-10-CM

## 2012-02-17 NOTE — Progress Notes (Signed)
Dr jenkins is aware 

## 2012-02-17 NOTE — Progress Notes (Signed)
Subjective:    Patient ID: [name], female    DOB: [birth year], 78 y.o.   MRN: [id number]  HPI Increased SOB  Patient is an 78 year old female who had a recent complicated course who presented with abdominal pain and bright red blood per rectum a CT scan was done to help differentiate between the proposed etiology of hemorrhoidal bleeding and other etiologies that might include diverticular bleeding and/or colon cancer.  She did have a family history of colon cancer first degree relative.  A CT scan showed adenopathy and she was referred to gastroenterology for colonoscopy the colonoscopy was clear there is no evidence of any diverticular bleeding although internal and external hemorrhoids were seen.  She underwent further workup by gastroenterology with an upper EGD which was also negative and was referred for biopsy of her abdominal adenopathy.  The abdominal adenopathy proved to be non- Hodgkin's large cell lymphoma Today we discussed her biopsy we discussed the fact that she will most probably undergo chemotherapy and radiation therapy and we discussed end-of-life issues.  2 daughters and her husband were present during our conversation and the patient gave permission to discuss her medical condition with both doctors and her husband at any time       Review of Systems  Constitutional: Positive for fatigue. Negative for activity change and appetite change.       Although she has had some appetite change from abdominal discomfort she has had no weight loss  HENT: Negative for ear pain, congestion, neck pain, postnasal drip and sinus pressure.   Eyes: Negative for redness and visual disturbance.  Respiratory: Negative for cough, shortness of breath and wheezing.   Gastrointestinal: Positive for abdominal distention and anal bleeding. Negative for abdominal pain.  Genitourinary: Negative for dysuria, frequency and menstrual problem.  Musculoskeletal: Positive for myalgias,  joint swelling and arthralgias.  Skin: Negative for rash and wound.  Neurological: Positive for weakness. Negative for dizziness and headaches.  Hematological: Negative for adenopathy. Does not bruise/bleed easily.  Psychiatric/Behavioral: Negative for disturbed wake/sleep cycle and decreased concentration.   Past Medical History  Diagnosis Date  . GERD (gastroesophageal reflux disease)   . Arthritis   . Stroke 10/2002    no residual  . History of TIAs   . Diverticulosis   . PUD (peptic ulcer disease)   . PVD (peripheral vascular disease)   . Hyperlipidemia   . Anxiety   . Depression   . 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 Blood transfusion   . COPD (chronic obstructive pulmonary disease)     History   Social History  . Marital Status: Married    Spouse Name: N/A    Number of Children: 6  . Years of Education: N/A   Occupational History  . retired    Social History Main Topics  . Smoking status: Former Games developer  . Smokeless tobacco: Never Used  . Alcohol Use: No  . Drug Use: No  . Sexually Active: Yes   Other Topics Concern  . Not on file   Social History Narrative  . No narrative on file    Past Surgical History  Procedure Date  . Appendectomy   . Colon surgery 09/13/2008    iliostomy for diverticulitis  . Hernia repair   . Tubal ligation   . Throat surgery 1986  . Total knee arthroplasty     2002 left 2008 right  . Reversal of iliostomy 01/2010  .  Cataract extraction 2005    bilateral  . Carotid surgery     2004 a month apart right and left    Family History  Problem Relation Age of Onset  . Colon cancer Sister     colon  . Hyperlipidemia Mother   . Stroke Mother   . Cancer Father     mesothelioma    Allergies  Allergen Reactions  . Propoxyphene Hcl   . Propoxyphene-Acetaminophen     REACTION: Reaction not known    Current Outpatient Prescriptions on File Prior to Visit  Medication Sig  Dispense Refill  . ALPRAZolam (XANAX) 0.5 MG tablet Take 1 tablet (0.5 mg total) by mouth 3 (three) times daily as needed for sleep.  90 tablet  3  . bisoprolol (ZEBETA) 5 MG tablet TAKE ONE TABLET BY MOUTH EVERY DAY  30 tablet  11  . diltiazem (CARDIZEM) 30 MG tablet Take 1 tablet (30 mg total) by mouth 3 (three) times daily.  90 tablet  11  . omeprazole (PRILOSEC) 40 MG capsule Take 40 mg by mouth daily as needed.        . tiotropium (SPIRIVA) 18 MCG inhalation capsule Place 18 mcg into inhaler and inhale daily.      . Vitamin D, Ergocalciferol, (DRISDOL) 50000 UNITS CAPS Take 50,000 Units by mouth every 7 (seven) days. Taken on Sundays.      Marland Kitchen warfarin (COUMADIN) 5 MG tablet TAKE ONE & ONE-HALF TABLETS BY MOUTH EVERY DAY  60 tablet  11    BP 130/80  Pulse 76  Temp 98.2 F (36.8 C)  Resp 18  Ht 5' 4.5" (1.638 m)  Wt 198 lb (89.812 kg)  BMI 33.46 kg/m2       Objective:   Physical Exam  Nursing note and vitals reviewed. Constitutional: She is oriented to person, place, and time. She appears well-developed and well-nourished. No distress.  HENT:  Head: Normocephalic and atraumatic.  Right Ear: External ear normal.  Left Ear: External ear normal.  Nose: Nose normal.  Mouth/Throat: Oropharynx is clear and moist.  Eyes: Conjunctivae and EOM are normal. Pupils are equal, round, and reactive to light.  Neck: Normal range of motion. Neck supple. No JVD present. No tracheal deviation present. No thyromegaly present.  Cardiovascular: Normal rate and regular rhythm.   Murmur heard. Pulmonary/Chest: Effort normal and breath sounds normal. She has no wheezes. She exhibits no tenderness.  Abdominal: Soft. Bowel sounds are normal. There is tenderness.  Musculoskeletal: Normal range of motion. She exhibits no edema and no tenderness.  Lymphadenopathy:    She has no cervical adenopathy.  Neurological: She is alert and oriented to person, place, and time. She has normal reflexes. No cranial  nerve deficit.  Skin: Skin is warm and dry. She is not diaphoretic.  Psychiatric: She has a normal mood and affect. Her behavior is normal.          Assessment & Plan:  B-cell lymphoma identified as large cell non-Hodgkin's lymphoma.  She will be referred to oncology.  We have discussed her case with her oncologist and we will order a staging PET scan.  We discussed in detail for over 45 minutes with both doctors and the patient and the patient's husband her diagnosis her prognosis and the fact that she will undergo chemotherapy and was probably radiation therapy as part of her protocol.  We also discussed her comorbid findings and how they complicates his treatment and that she has moderately severe COPD hypertension  and a history of anemia.  She states that she is not currently depressed by this diagnosis and that she wants "everything done possible I am not ready to die" but we also addressed end-of-life issues such as if he would want to be left on the respirator were to continue to be supported at a time the doctors believed that her prognosis was terminal. She will consider this with her husband and her daughters at her next office visit in one month we will establish a living will.

## 2012-02-18 ENCOUNTER — Telehealth: Payer: Self-pay | Admitting: Internal Medicine

## 2012-02-18 ENCOUNTER — Telehealth: Payer: Self-pay | Admitting: Oncology

## 2012-02-18 ENCOUNTER — Telehealth: Payer: Self-pay | Admitting: *Deleted

## 2012-02-18 ENCOUNTER — Encounter: Payer: Self-pay | Admitting: Internal Medicine

## 2012-02-18 ENCOUNTER — Ambulatory Visit: Payer: Medicare Other | Admitting: Internal Medicine

## 2012-02-18 DIAGNOSIS — Z8579 Personal history of other malignant neoplasms of lymphoid, hematopoietic and related tissues: Secondary | ICD-10-CM | POA: Insufficient documentation

## 2012-02-18 DIAGNOSIS — C8510 Unspecified B-cell lymphoma, unspecified site: Secondary | ICD-10-CM | POA: Insufficient documentation

## 2012-02-18 NOTE — Telephone Encounter (Signed)
Message copied by Marlowe Kays on Thu Feb 18, 2012 10:33 AM ------      Message from: Melvia Heaps D      Created: Wed Feb 17, 2012 12:38 PM       Please schedule app"t with Dr. Gaylyn Rong from heme/onc for new dx of lymphoma.  I discussed results with pt.

## 2012-02-18 NOTE — Telephone Encounter (Signed)
S/w the pt and she is aware of the July appt. Pt stated that she will call me back to confirm that the date is ok.

## 2012-02-18 NOTE — Telephone Encounter (Signed)
PATIENT WILL BE SEEN EITHER TODAY OR TOMORROW, DR HA HAS ALREADY SPOKE TO DR Arlyce Dice REGARDING THIS PT.

## 2012-02-18 NOTE — Telephone Encounter (Signed)
Caller: [name]/Patient; PCP: [name]; CB#: [phone]; ; ; Call regarding Question;  [name] states she was in the office on 02/17/12 and was going to be referred to Dr. Truett Perna,  Oncology.  Received call today from Oncology stating she has an appt with Dr. Gaylyn Rong on Wednesday 02/24/12.  Tinsley wants to know why the appt is with Dr. Gaylyn Rong instead of Dr. Truett Perna.  Please f/u with [name].

## 2012-02-18 NOTE — Patient Instructions (Signed)
I will contact the oncologist to let him know that we are referring him and find out what is the appropriate staging study to do prior to your initiation of chemotherapy.

## 2012-02-19 ENCOUNTER — Telehealth: Payer: Self-pay | Admitting: Oncology

## 2012-02-19 NOTE — Telephone Encounter (Signed)
S/w pt re appt for 7/23 @ 1:30pm w/BS. D/t per BS.

## 2012-02-23 ENCOUNTER — Telehealth: Payer: Self-pay | Admitting: Oncology

## 2012-02-23 ENCOUNTER — Ambulatory Visit (HOSPITAL_BASED_OUTPATIENT_CLINIC_OR_DEPARTMENT_OTHER): Payer: Medicare Other | Admitting: Oncology

## 2012-02-23 ENCOUNTER — Ambulatory Visit: Payer: Medicare Other

## 2012-02-23 VITALS — BP 118/65 | HR 70 | Temp 97.2°F | Ht 64.5 in | Wt 197.6 lb

## 2012-02-23 DIAGNOSIS — C8589 Other specified types of non-Hodgkin lymphoma, extranodal and solid organ sites: Secondary | ICD-10-CM

## 2012-02-23 DIAGNOSIS — C8510 Unspecified B-cell lymphoma, unspecified site: Secondary | ICD-10-CM

## 2012-02-23 NOTE — Telephone Encounter (Signed)
Gave pt appt calendar for July and August , Lab, PET and ML

## 2012-02-23 NOTE — Progress Notes (Signed)
[facility] New Patient Consult   Referring MD: Breeonna Mone 78 y.o.  07-Oct-1927    Reason for Referral: Non-Hodgkin's lymphoma     HPI: She developed the acute onset of bright red blood per rectum on approximately 01/10/2012. This lasted for 7 days. She was referred for a CT of the abdomen and pelvis on 01/15/2012. No pleural or pericardial effusion was noted. No focal liver abnormality. The pancreas appeared normal. Both adrenal glands appeared normal. A subtle lesion at the upper pole of the right kidney measured 1.4 cm compared to a previous measurement of 1 cm in September of 2011. The bladder and uterus appeared normal. There is a left ovarian cyst. Multiple enlarged upper abdominal and pelvic lymph nodes were seen. A portacaval node measured 1.3 cm, a periaortic node measured 1.4 cm, a right iliac node measured 2.6 cm. The stomach, small bowel, and colon appeared normal.  She was referred to Dr. Arlyce Dice and underwent an upper endoscopy on 02/11/2012. An AVM was noted in the body of the stomach, nonbleeding. An AVM was noted in the distal esophagus. The examination was otherwise normal. A colonoscopy revealed moderate diverticulosis in the sigmoid colon. There was colitis in the sigmoid colon. Biopsies were taken. The examination was otherwise normal. External hemorrhoids were noted on digital exam. The biopsy revealed benign colonic mucosa. No active colitis, granulomata, or adenomatous epithelium was identified.  She was referred for a CT-guided biopsy of the right iliac lymph node on 02/12/2012. The pathology [id number]) confirmed a large B-cell lymphoma. The core biopsy showed lymphoid tissue involved by a lymphoproliferative process characterized by abundant large lymphoid cells. Brisk mitosis and a proptosis were noted. Definite follicular structures were not identified. Flow cytometry was noncontributory. Immunohistochemical stains showed the large  atypical lymphoid cells were positive for LCA, CD20, and CD79a. They were negative for CD3, ALK protein, CD15, cytokeratin AE1/AE3, and S100.  She reports feeling well. No further rectal bleeding.  Past Medical History  Diagnosis Date  . GERD (gastroesophageal reflux disease)   . Arthritis   . Stroke 10/2002    no residual  . History of TIAs   . Diverticulosis   . PUD (peptic ulcer disease)   . PVD (peripheral vascular disease)   . Hyperlipidemia   . Anxiety   . Depression   . Laryngeal carcinoma  1986     remotely with resection and xrt/chronic hoarsemenss  .  history of Blood transfusion   . COPD (chronic obstructive pulmonary disease)    .    G8, P6, 2 miscarriages  .    Psoriasis  .    Right leg DVT                                                                                                    1954 Past Surgical History  Procedure Date  . Appendectomy   . Colon surgery 09/13/2008    iliostomy for diverticulitis  . Hernia repair   . Tubal ligation   . Throat surgery 1986  . Total knee  arthroplasty     2002 left 2008 right  . Reversal of iliostomy 01/2010  . Cataract extraction 2005    bilateral  . Carotid surgery     2004 a month apart right and left    Family History  Problem Relation Age of Onset  . Colon cancer Sister  29     colon  . Hyperlipidemia Mother   . Stroke Mother   . Cancer Father  43    mesothelioma    .    Pancreas cancer                                     Brother                                      13  Current outpatient prescriptions:ALPRAZolam (XANAX) 0.5 MG tablet, Take 1 tablet (0.5 mg total) by mouth 3 (three) times daily as needed for sleep., Disp: 90 tablet, Rfl: 3;  bisoprolol (ZEBETA) 5 MG tablet, TAKE ONE TABLET BY MOUTH EVERY DAY, Disp: 30 tablet, Rfl: 11;  diltiazem (CARDIZEM) 30 MG tablet, Take 1 tablet (30 mg total) by mouth 3 (three) times daily., Disp: 90 tablet, Rfl: 11 omeprazole (PRILOSEC) 40 MG capsule, Take 40 mg by mouth  daily as needed.  , Disp: , Rfl: ;  tiotropium (SPIRIVA) 18 MCG inhalation capsule, Place 18 mcg into inhaler and inhale daily., Disp: , Rfl: ;  Vitamin D, Ergocalciferol, (DRISDOL) 50000 UNITS CAPS, Take 50,000 Units by mouth every 7 (seven) days. Taken on Sundays., Disp: , Rfl: ;  warfarin (COUMADIN) 5 MG tablet, TAKE ONE & ONE-HALF TABLETS BY MOUTH EVERY DAY, Disp: 60 tablet, Rfl: 11 clotrimazole-betamethasone (LOTRISONE) lotion, Apply topically. For psoriasis, Disp: , Rfl:   Allergies:  Allergies  Allergen Reactions  . Propoxyphene Hcl   . Propoxyphene-Acetaminophen     REACTION: Reaction not known    Social History: She lives with her husband and Lawai. 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 She smoked 2 packs of cigarettes for 37 years and quit in 1986. She does not use alcohol. She was transfused following a miscarriage in 1956 and when she was diagnosed with diverticulitis. She denies risk factors for HIV and hepatitis the   ROS:   Positives include: Chronic hoarseness, chronic exertional dyspnea, cough following the July endoscopy, chronic irregular bowel movements following diverticulitis surgery, numbness at the bottom of the feet for the past one year, arthritis in the fingers, recurrent urinary tract infections, low leg rash secondary to psoriasis, right leg phlebitis in 1954  A complete ROS was otherwise negative.  Physical Exam:  Blood pressure 118/65, pulse 70, temperature 97.2 F (36.2 C), temperature source Oral, height 5' 4.5" (1.638 m), weight 197 lb 9.6 oz (89.631 kg).  HEENT: Oropharynx without visible mass, upper denture plate, neck without mass Lungs: Clear bilaterally Cardiac: Regular rate and rhythm Abdomen: No hepatomegaly, no mass  Vascular: No leg edema Lymph nodes: No cervical, supraclavicular, axillary, or inguinal nodes Neurologic: Alert and oriented, the motor exam appears intact in the upper and lower  Jevity's    LAB:  CBC  Lab Results  Component Value Date   WBC 3.9* 02/12/2012   HGB 13.6 02/12/2012   HCT 42.3 02/12/2012   MCV 86.5 02/12/2012  PLT 189 02/12/2012     CMP      Component Value Date/Time   NA 141 01/11/2012 1524   K 4.8 01/11/2012 1524   CL 105 01/11/2012 1524   CO2 29 01/11/2012 1524   GLUCOSE 93 01/11/2012 1524   BUN 18 01/11/2012 1524   CREATININE 1.2 01/11/2012 1524   CALCIUM 9.3 01/11/2012 1524   PROT 7.1 07/02/2010 1208   ALBUMIN 4.0 07/02/2010 1208   AST 20 07/02/2010 1208   ALT 21 07/02/2010 1208   ALKPHOS 69 07/02/2010 1208   BILITOT 0.4 07/02/2010 1208   GFRNONAA 53.02 02/13/2010 0838   GFRAA  Value: >60        The eGFR has been calculated using the MDRD equation. This calculation has not been validated in all clinical situations. eGFR's persistently <60 mL/min signify possible Chronic Kidney Disease. 12/23/2009 1610     Radiology: As per history of present illness-I reviewed the 01/15/2012 CT scan    Assessment/Plan:   1. Non-Hodgkin's lymphoma, large B-cell lymphoma, involving a needle core biopsy of a right iliac lymph node 02/12/2012     -CT of the abdomen and pelvis on 01/15/2012 confirming abdominal/pelvic lymphadenopathy  2. Rectal bleeding June 2013-status post a negative upper endoscopy 02/11/2012 and colonoscopy 01/28/2012, the bleeding was likely related to hemorrhoids  3. COPD  4. Remote history of larynx cancer   Disposition:   She has been diagnosed with non-Hodgkin's lymphoma. The lymphoma appears to be an incidental finding after she presented with unrelated rectal bleeding. She is scheduled to undergo a staging PET scan on 02/25/2012.  I discussed treatment options with Ms. Starace and her family. We will consider initiating systemic chemotherapy based on the staging PET scan and LDH. We discussed the possibility of a staging bone marrow biopsy.  She will return for an office visit in one week. I explained the diagnosis of  non-Hodgkin's lymphoma is best made from an excisional lymph node biopsy. She does not appear to have an accessible enlarged lymph node for an excisional biopsy. We will therefore proceed with treatment decisions based on the needle core biopsy.    Abdiel Blackerby 02/23/2012, 6:41 PM

## 2012-02-25 ENCOUNTER — Other Ambulatory Visit (HOSPITAL_BASED_OUTPATIENT_CLINIC_OR_DEPARTMENT_OTHER): Payer: Medicare Other | Admitting: Lab

## 2012-02-25 ENCOUNTER — Encounter (HOSPITAL_COMMUNITY)
Admission: RE | Admit: 2012-02-25 | Discharge: 2012-02-25 | Disposition: A | Discharge status: Home / Self Care | Payer: Medicare Other | Source: Ambulatory Visit | Attending: Internal Medicine | Admitting: Internal Medicine

## 2012-02-25 DIAGNOSIS — C8589 Other specified types of non-Hodgkin lymphoma, extranodal and solid organ sites: Secondary | ICD-10-CM

## 2012-02-25 DIAGNOSIS — I2510 Atherosclerotic heart disease of native coronary artery without angina pectoris: Secondary | ICD-10-CM | POA: Insufficient documentation

## 2012-02-25 DIAGNOSIS — C8510 Unspecified B-cell lymphoma, unspecified site: Secondary | ICD-10-CM

## 2012-02-25 DIAGNOSIS — K8020 Calculus of gallbladder without cholecystitis without obstruction: Secondary | ICD-10-CM | POA: Insufficient documentation

## 2012-02-25 DIAGNOSIS — Z9049 Acquired absence of other specified parts of digestive tract: Secondary | ICD-10-CM | POA: Insufficient documentation

## 2012-02-25 DIAGNOSIS — K5730 Diverticulosis of large intestine without perforation or abscess without bleeding: Secondary | ICD-10-CM | POA: Insufficient documentation

## 2012-02-25 DIAGNOSIS — R599 Enlarged lymph nodes, unspecified: Secondary | ICD-10-CM | POA: Insufficient documentation

## 2012-02-25 LAB — CBC WITH DIFFERENTIAL/PLATELET
Basophils Absolute: 0 10*3/uL (ref 0.0–0.1)
Eosinophils Absolute: 0.1 10*3/uL (ref 0.0–0.5)
HCT: 43.1 % (ref 34.8–46.6)
HGB: 14.2 g/dL (ref 11.6–15.9)
MCV: 85.5 fL (ref 79.5–101.0)
MONO%: 12.7 % (ref 0.0–14.0)
NEUT#: 2.7 10*3/uL (ref 1.5–6.5)
NEUT%: 60.3 % (ref 38.4–76.8)
RDW: 15.3 % — ABNORMAL HIGH (ref 11.2–14.5)
lymph#: 1 10*3/uL (ref 0.9–3.3)

## 2012-02-25 LAB — COMPREHENSIVE METABOLIC PANEL
ALT: 40 U/L — ABNORMAL HIGH (ref 0–35)
AST: 36 U/L (ref 0–37)
Alkaline Phosphatase: 62 U/L (ref 39–117)
BUN: 19 mg/dL (ref 6–23)
Chloride: 106 mEq/L (ref 96–112)
Glucose, Bld: 99 mg/dL (ref 70–99)
Potassium: 4.4 mEq/L (ref 3.5–5.3)
Total Bilirubin: 0.6 mg/dL (ref 0.3–1.2)
Total Protein: 7.1 g/dL (ref 6.0–8.3)

## 2012-02-25 LAB — LACTATE DEHYDROGENASE: LDH: 187 U/L (ref 94–250)

## 2012-02-25 MED ORDER — FLUDEOXYGLUCOSE F - 18 (FDG) INJECTION
15.0000 | Freq: Once | INTRAVENOUS | Status: AC | PRN
  Administered 2012-02-25: 15 via INTRAVENOUS

## 2012-03-03 ENCOUNTER — Ambulatory Visit (HOSPITAL_BASED_OUTPATIENT_CLINIC_OR_DEPARTMENT_OTHER): Payer: Medicare Other | Admitting: Nurse Practitioner

## 2012-03-03 ENCOUNTER — Telehealth: Payer: Self-pay | Admitting: *Deleted

## 2012-03-03 ENCOUNTER — Telehealth: Payer: Self-pay | Admitting: Oncology

## 2012-03-03 ENCOUNTER — Ambulatory Visit: Payer: Medicare Other

## 2012-03-03 VITALS — BP 95/63 | HR 97 | Temp 97.9°F | Ht 64.5 in | Wt 196.5 lb

## 2012-03-03 DIAGNOSIS — C8510 Unspecified B-cell lymphoma, unspecified site: Secondary | ICD-10-CM

## 2012-03-03 DIAGNOSIS — C8588 Other specified types of non-Hodgkin lymphoma, lymph nodes of multiple sites: Secondary | ICD-10-CM

## 2012-03-03 DIAGNOSIS — C8589 Other specified types of non-Hodgkin lymphoma, extranodal and solid organ sites: Secondary | ICD-10-CM

## 2012-03-03 NOTE — Telephone Encounter (Signed)
ok 

## 2012-03-03 NOTE — Telephone Encounter (Signed)
appts made and printed for pt,pt aware that I will call with echo appt and that she will get a call regarding the port and bm bx      aom

## 2012-03-03 NOTE — Telephone Encounter (Signed)
Dr sherrill's office called - pt to have access line in at interventional radiology and wants to know if ok to hold coumadin 5 days prior--dr jenkins patient

## 2012-03-03 NOTE — Progress Notes (Signed)
Per Rushie Goltz at Dr Lovell Sheehan office, Dr [name] Ram is off until next week.  Dr Christy Gentles ok'd patient to stop coumadin for 5 days prior to procedure 03-09-12.

## 2012-03-03 NOTE — Progress Notes (Signed)
OFFICE PROGRESS NOTE  Interval history:  [name] is an 78 year old woman recently diagnosed with non-Hodgkin's lymphoma, large B-cell lymphoma. The LDH returned normal at 187 on 02/25/2012. Staging PET scan 02/25/2012 showed a small lymph node posteriolateral to the oropharynx on the left measuring 9 x 7 mm with SUV 5.4; a large cluster of hypermetabolic lymph nodes in the right hilum with SUV 4.3; cluster of mildly enlarged hepatoduodenal ligament lymph nodes and mildly enlarged portacaval lymph node with hypermetabolic activity (SUV 37.1); numerous borderline enlarged and mildly enlarged periaortic retroperitoneal lymph nodes with extensive hypermetabolic activity (SUV 43.6-47.6); numerous borderline enlarged and mildly enlarged hypermetabolic lymph nodes along the pelvic sidewall bilaterally most pronounced on the right with a right external iliac lymph node measuring 2.9 x 4.1 cm with SUV 52.5.  She continues to have a good appetite. She denies fever. Over the past several months she has had intermittent night sweats. The night sweats have occurred on a more consistent basis over the past 2 weeks. No rectal bleeding. Intermittent mild nausea. No vomiting.   Objective: Blood pressure 95/63, pulse 97, temperature 97.9 F (36.6 C), temperature source Oral, height 5' 4.5" (1.638 m), weight 196 lb 8 oz (89.132 kg).  Oropharynx is without thrush or ulceration. No palpable cervical, supraclavicular or axillary lymph nodes. Lungs are clear. No wheezes or rales. Regular cardiac rhythm. Abdomen is soft and nontender. No organomegaly. Extremities are without edema.  Lab Results: Lab Results  Component Value Date   WBC 4.6 02/25/2012   HGB 14.2 02/25/2012   HCT 43.1 02/25/2012   MCV 85.5 02/25/2012   PLT 185 02/25/2012    Chemistry:    Chemistry      Component Value Date/Time   NA 139 02/25/2012 0804   K 4.4 02/25/2012 0804   CL 106 02/25/2012 0804   CO2 22 02/25/2012 0804   BUN 19 02/25/2012 0804     CREATININE 1.10 02/25/2012 0804      Component Value Date/Time   CALCIUM 9.7 02/25/2012 0804   ALKPHOS 62 02/25/2012 0804   AST 36 02/25/2012 0804   ALT 40* 02/25/2012 0804   BILITOT 0.6 02/25/2012 0804       Studies/Results: Nm Pet Image Initial (pi) Skull Base To Thigh  02/25/2012  *RADIOLOGY REPORT*  Clinical Data: Initial treatment strategy for large cell non- Hodgkins lymphoma.  NUCLEAR MEDICINE PET SKULL BASE TO THIGH  Fasting Blood Glucose:  90  Technique:  15 mCi F-18 FDG was injected intravenously. CT data was obtained and used for attenuation correction and anatomic localization only.  (This was not acquired as a diagnostic CT examination.) Additional exam technical data entered on technologist worksheet.  Comparison:  CT abdomen and pelvis 01/14/2012.  Chest CT 10/12/2008.  Findings:  Neck: Small lymph node posterolateral to the oropharynx on the left (image 22 of series 2) measuring 9 x 7 mm (SUVmax = 5.4)No other pathologic enlarged nodes or hypermetabolic lymph nodes within the neck.  Chest: There is a large cluster of hypermetabolic lymph nodes in the right hilum (SUVmax = 4.3).  No suspicious appearing pulmonary nodules or masses are identified. Heart size is mildly enlarged. There is atherosclerosis of the thoracic aorta, the great vessels of the mediastinum and the coronary arteries, including calcified atherosclerotic plaque in the left main, left anterior descending, left circumflex and right coronary arteries. Esophagus is unremarkable in appearance.  Abdomen/Pelvis: There is a cluster of mildly enlarged hepatoduodenal ligament lymph nodes and mildly enlarged portacaval lymph node (  1.4 cm short axis) with hypermetabolic activity (SUVmax = 37.1).  Numerous borderline enlarged and mildly enlarged periaortic retroperitoneal lymph nodes are present with extensive hypermetabolic activity (SUVmax = 43.6 - 47.6).  There are numerous borderline enlarged and mildly enlarged hypermetabolic  lymph nodes along the pelvic side wall bilaterally, most pronounced on the right, where there is a right external iliac lymph node measuring 2.9 x 4.1 cm (SUVmax = 52.5).  Cholelithiasis without evidence to suggest acute cholecystitis. The unenhanced appearance of the liver, pancreas, spleen, bilateral adrenal glands and bilateral kidneys is unremarkable.  No ascites or pneumoperitoneum and no pathologic distension of bowel.  There are numerous colonic diverticula, without surrounding inflammatory changes to suggest an acute diverticulitis at this time. Postoperative changes of partial sigmoidectomy are noted with a suture line at the rectosigmoid junction.  Uterus is mildly heterogeneous, suggesting fibroids.  Ovaries are unremarkable in appearance.  Urinary bladder is normal in appearance.  Extensive atherosclerosis, including fusiform ectasia of the infrarenal abdominal aorta measuring up to 2.7 x 2.6 cm.  [place]:  No focal hypermetabolic activity to suggest skeletal metastasis.  IMPRESSION: 1.  Extensive hypermetabolic lymphadenopathy, as detailed above, compatible with widespread lymphoma. 2.  Extensive atherosclerosis, including left main and three vessel coronary artery disease. 3.  Cholelithiasis without evidence to suggest acute cholecystitis. 4.  Mild colonic diverticulosis without findings to suggest acute diverticulitis. 6.  Status post partial sigmoidectomy. 7.  Additional incidental findings, as above.  Original Report Authenticated By: [name], M.D.   Ct Biopsy  02/12/2012  *RADIOLOGY REPORT*  Clinical Data: Enlarged right pelvic lymph node.  CT GUIDED CORE BIOPSY OF RIGHT TO ILIAC LYMPH NODE  Sedation:   5.0 mg IV Versed;  250 mcg IV Fentanyl  Total Moderate Sedation Time: 70 minutes.  Procedure:  The procedure risks, benefits, and alternatives were explained to the patient.  Questions regarding the procedure were encouraged and answered.  The patient understands and consents to the  procedure.  The right gluteal region was prepped with Betadine in a sterile fashion, and a sterile drape was applied covering the operative field.  A sterile gown and sterile gloves were used for the procedure.  Local anesthesia was provided with 1% Lidocaine.  Initial CT imaging was performed in a supine position with the right side slightly raised.  A Foley catheter was used to completely decompress the bladder.  The patient was then placed in a decubitus position with the right side up.  From a right transgluteal approach, a 17 gauge trocar needle was advanced under CT guidance to the level of the enlarged right iliac lymph node mass.  Coaxial 18-gauge core biopsy samples were obtained.  A total of 5 samples were submitted in saline.  Complications: None  Findings: Initial imaging did reveal a possible anterior window to allow lymph node biopsy.  The patient could not completely decompress her bladder and there was bladder adjacent and posterior to the lymph node containing urine.  The bladder was therefore decompressed to minimize risk of bladder injury.  After decompression, no anterior window was present for biopsy.  From a right transgluteal approach, the enlarged external iliac chain lymph node was able to be approached.  This lymph node measures at least 3.5 cm in diameter.  Solid tissue was obtained. There were no immediate complications.  IMPRESSION: CT guided core biopsy performed of enlarged right iliac chain lymph node.  Original Report Authenticated By: Reola Calkins, M.D.    Medications: I have  reviewed the patient's current medications.  Assessment/Plan:  1. Non-Hodgkin's lymphoma, large B-cell lymphoma, CD20 positive involving 8 needle core biopsy of a right iliac lymph node 02/12/2012. CT of the abdomen and pelvis on 01/15/2012 showed abdominal/pelvic lymphadenopathy. Serum LDH in normal range on 02/25/2012. Staging PET scan 02/25/2012 with a small lymph node posteriolateral to the  oropharynx on the left measuring 9 x 7 mm with SUV 5.4; large cluster of hypermetabolic lymph nodes in the right hilum with SUV 4.3; cluster of mildly enlarged hepatoduodenal ligament lymph nodes and mildly enlarged portacaval lymph node with hypermetabolic activity (SUV max 37.1); numerous borderline enlarged and mildly enlarged periaortic retroperitoneal lymph nodes with extensive hypermetabolic activity (SUV max 43.6-47.6); numerous borderline enlarged and mildly enlarged hypermetabolic lymph nodes along the pelvic sidewall bilaterally most pronounced on the right with a right external iliac lymph node measuring 2.9 x 4.1 cm (SUV max 52.5). 2. Night sweats likely secondary to #1. 3. Rectal bleeding June 2013 status post negative colonoscopy 01/28/2012 and upper endoscopy 02/11/2012. The bleeding was likely related to hemorrhoids. 4. COPD. 5. Remote history of larynx cancer.  Disposition-Dr. Truett Perna reviewed the PET scan findings with [name] and her family. Treatment options were again discussed. Dr. Truett Perna recommends treatment with CHOP/Rituxan. We reviewed potential toxicities associated with chemotherapy including myelosuppression, nausea, hair loss, mouth sores. We discussed the potential for cardiotoxicity with Adriamycin. We discussed the potential for neuropathy with vincristine. We discussed the vesicant properties with both Adriamycin and vincristine. We discussed potential toxicities associated with Rituxan including an allergic reaction (possibly severe), infusion related reaction, reactivation of hepatitis B, leukoencephalopathy. We also discussed potential toxicities associated with Neulasta including arthralgias, rash, splenic rupture.  She will attend a chemotherapy education class. We are referring her for a staging bone marrow biopsy in interventional radiology. We are also referring her to interventional radiology for Port-A-Cath placement. We will obtain a baseline 2-D  echocardiogram and hepatitis B serologies. She will return for a followup visit on 03/14/2012 with plans for cycle 1 CHOP/Rituxan on 03/15/2012 with Neulasta support.  Patient seen with Dr. Truett Perna.  Lonna Cobb ANP/GNP-BC

## 2012-03-04 ENCOUNTER — Encounter (HOSPITAL_COMMUNITY): Payer: Self-pay | Admitting: Pharmacy Technician

## 2012-03-04 LAB — HEPATITIS B CORE ANTIBODY, TOTAL: Hep B Core Total Ab: NEGATIVE

## 2012-03-04 LAB — HEPATITIS B SURFACE ANTIBODY,QUALITATIVE: Hep B S Ab: NEGATIVE

## 2012-03-08 ENCOUNTER — Other Ambulatory Visit: Payer: Self-pay | Admitting: Radiology

## 2012-03-08 ENCOUNTER — Encounter: Payer: Self-pay | Admitting: *Deleted

## 2012-03-08 ENCOUNTER — Other Ambulatory Visit: Payer: Medicare Other

## 2012-03-08 ENCOUNTER — Telehealth: Payer: Self-pay | Admitting: *Deleted

## 2012-03-08 NOTE — Telephone Encounter (Signed)
Gave patient appointment for 03-11-2012 echo for the 10:00am printed out calendar and gave to the patient scan will be at [facility]

## 2012-03-09 ENCOUNTER — Other Ambulatory Visit: Payer: Self-pay | Admitting: Nephrology

## 2012-03-09 ENCOUNTER — Telehealth: Payer: Self-pay | Admitting: *Deleted

## 2012-03-09 ENCOUNTER — Ambulatory Visit: Payer: Medicare Other | Admitting: Internal Medicine

## 2012-03-09 ENCOUNTER — Ambulatory Visit (HOSPITAL_COMMUNITY)
Admission: RE | Admit: 2012-03-09 | Discharge: 2012-03-09 | Disposition: A | Discharge status: Home / Self Care | Payer: Medicare Other | Source: Ambulatory Visit | Attending: Nurse Practitioner | Admitting: Nurse Practitioner

## 2012-03-09 ENCOUNTER — Other Ambulatory Visit: Payer: Self-pay | Admitting: Nurse Practitioner

## 2012-03-09 DIAGNOSIS — C8589 Other specified types of non-Hodgkin lymphoma, extranodal and solid organ sites: Secondary | ICD-10-CM | POA: Insufficient documentation

## 2012-03-09 DIAGNOSIS — C8510 Unspecified B-cell lymphoma, unspecified site: Secondary | ICD-10-CM

## 2012-03-09 DIAGNOSIS — J4489 Other specified chronic obstructive pulmonary disease: Secondary | ICD-10-CM | POA: Insufficient documentation

## 2012-03-09 DIAGNOSIS — K219 Gastro-esophageal reflux disease without esophagitis: Secondary | ICD-10-CM | POA: Insufficient documentation

## 2012-03-09 DIAGNOSIS — Z8673 Personal history of transient ischemic attack (TIA), and cerebral infarction without residual deficits: Secondary | ICD-10-CM | POA: Insufficient documentation

## 2012-03-09 DIAGNOSIS — J449 Chronic obstructive pulmonary disease, unspecified: Secondary | ICD-10-CM | POA: Insufficient documentation

## 2012-03-09 DIAGNOSIS — E785 Hyperlipidemia, unspecified: Secondary | ICD-10-CM | POA: Insufficient documentation

## 2012-03-09 DIAGNOSIS — Z79899 Other long term (current) drug therapy: Secondary | ICD-10-CM | POA: Insufficient documentation

## 2012-03-09 LAB — CBC
Platelets: 229 10*3/uL (ref 150–400)
RBC: 4.92 MIL/uL (ref 3.87–5.11)
WBC: 4.4 10*3/uL (ref 4.0–10.5)

## 2012-03-09 LAB — PROTIME-INR: Prothrombin Time: 13.1 seconds (ref 11.6–15.2)

## 2012-03-09 MED ORDER — MIDAZOLAM HCL 5 MG/5ML IJ SOLN
INTRAMUSCULAR | Status: AC | PRN
  Administered 2012-03-09: 1 mg via INTRAVENOUS

## 2012-03-09 MED ORDER — MIDAZOLAM HCL 5 MG/5ML IJ SOLN
INTRAMUSCULAR | Status: AC | PRN
  Administered 2012-03-09 (×2): 1 mg via INTRAVENOUS

## 2012-03-09 MED ORDER — HEPARIN SOD (PORK) LOCK FLUSH 100 UNIT/ML IV SOLN
INTRAVENOUS | Status: AC | PRN
  Administered 2012-03-09: 500 [IU]

## 2012-03-09 MED ORDER — ONDANSETRON HCL 4 MG/2ML IJ SOLN
INTRAMUSCULAR | Status: AC
  Filled 2012-03-09: qty 2

## 2012-03-09 MED ORDER — FENTANYL CITRATE 0.05 MG/ML IJ SOLN
INTRAMUSCULAR | Status: AC | PRN
  Administered 2012-03-09: 100 ug via INTRAVENOUS

## 2012-03-09 MED ORDER — FENTANYL CITRATE 0.05 MG/ML IJ SOLN
INTRAMUSCULAR | Status: AC | PRN
  Administered 2012-03-09 (×2): 50 ug via INTRAVENOUS

## 2012-03-09 MED ORDER — SODIUM CHLORIDE 0.9 % IV SOLN
INTRAVENOUS | Status: DC
  Administered 2012-03-09: 09:00:00 via INTRAVENOUS

## 2012-03-09 MED ORDER — ONDANSETRON HCL 4 MG/2ML IJ SOLN
4.0000 mg | Freq: Once | INTRAMUSCULAR | Status: AC
  Administered 2012-03-09: 4 mg via INTRAVENOUS

## 2012-03-09 MED ORDER — CEFAZOLIN SODIUM-DEXTROSE 2-3 GM-% IV SOLR
2.0000 g | INTRAVENOUS | Status: AC
  Administered 2012-03-09: 2 g via INTRAVENOUS
  Filled 2012-03-09: qty 50

## 2012-03-09 MED ORDER — FENTANYL CITRATE 0.05 MG/ML IJ SOLN
INTRAMUSCULAR | Status: AC
  Filled 2012-03-09: qty 6

## 2012-03-09 MED ORDER — LIDOCAINE HCL 1 % IJ SOLN
INTRAMUSCULAR | Status: AC
  Filled 2012-03-09: qty 20

## 2012-03-09 MED ORDER — MIDAZOLAM HCL 2 MG/2ML IJ SOLN
INTRAMUSCULAR | Status: AC
  Filled 2012-03-09: qty 6

## 2012-03-09 NOTE — Procedures (Signed)
Technically successful CT guided bone marrow aspiration and biopsy of left iliac crest.. Awaiting pathology report.    Successful placement of right IJ approach port-a-cath with tip at superior aspect of the right atrium. The catheter is ready for immediate use. No immediate post procedural complications.

## 2012-03-09 NOTE — H&P (Signed)
[name] is an 78 y.o. female.   Chief Complaint: "I'm here for a port a cath and bone marrow biopsy" HPI: Patient with history of NHL presents today for CT guided bone marrow biopsy and port a cath placement for chemotherapy.  Past Medical History  Diagnosis Date  . GERD (gastroesophageal reflux disease)   . Arthritis   . Stroke 10/2002    no residual  . History of TIAs   . Diverticulosis   . PUD (peptic ulcer disease)   . PVD (peripheral vascular disease)   . Hyperlipidemia   . Anxiety   . Depression   . 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 Blood transfusion   . COPD (chronic obstructive pulmonary disease)     Past Surgical History  Procedure Date  . Appendectomy   . Colon surgery 09/13/2008    iliostomy for diverticulitis  . Hernia repair   . Tubal ligation   . Throat surgery 1986  . Total knee arthroplasty     2002 left 2008 right  . Reversal of iliostomy 01/2010  . Cataract extraction 2005    bilateral  . Carotid surgery     2004 a month apart right and left    Family History  Problem Relation Age of Onset  . Colon cancer Sister     colon  . Hyperlipidemia Mother   . Stroke Mother   . Cancer Father     mesothelioma   Social History:  reports that she has quit smoking. She has never used smokeless tobacco. She reports that she does not drink alcohol or use illicit drugs.  Allergies:  Allergies  Allergen Reactions  . Propoxyphene Hcl   . Propoxyphene-Acetaminophen     REACTION: Reaction not known   Current outpatient prescriptions:ALPRAZolam (XANAX) 0.5 MG tablet, Take 1 tablet (0.5 mg total) by mouth 3 (three) times daily as needed for sleep., Disp: 90 tablet, Rfl: 3;  bisoprolol (ZEBETA) 5 MG tablet, Take 5 mg by mouth daily at 12 noon., Disp: , Rfl: ;  clotrimazole-betamethasone (LOTRISONE) lotion, Apply 1 application topically 2 (two) times daily as needed. For psoriasis, Disp: , Rfl:  diltiazem  (CARDIZEM) 30 MG tablet, Take 1 tablet (30 mg total) by mouth 3 (three) times daily., Disp: 90 tablet, Rfl: 11;  Multiple Vitamin (MULTIVITAMIN WITH MINERALS) TABS, Take 1 tablet by mouth daily., Disp: , Rfl: ;  omeprazole (PRILOSEC) 40 MG capsule, Take 40 mg by mouth daily as needed. Heart burn, Disp: , Rfl: ;  tiotropium (SPIRIVA) 18 MCG inhalation capsule, Place 18 mcg into inhaler and inhale daily., Disp: , Rfl:  Vitamin D, Ergocalciferol, (DRISDOL) 50000 UNITS CAPS, Take 50,000 Units by mouth every 7 (seven) days. Taken on Sundays., Disp: , Rfl: ;  warfarin (COUMADIN) 5 MG tablet, Take 7.5 mg by mouth at bedtime., Disp: , Rfl:  Current facility-administered medications:0.9 %  sodium chloride infusion, , Intravenous, Continuous, D Kevin Allred, PA;  ceFAZolin (ANCEF) IVPB 2 g/50 mL premix, 2 g, Intravenous, On Call, D Jeananne Rama, PA    Results for orders placed during the hospital encounter of 03/09/12 (from the past 48 hour(s))  APTT     Status: Normal   Collection Time   03/09/12  7:26 AM      Component Value Range Comment   aPTT 28  24 - 37 seconds   CBC     Status: Normal   Collection Time   03/09/12  7:26 AM      Component Value Range Comment   WBC 4.4  4.0 - 10.5 K/uL    RBC 4.92  3.87 - 5.11 MIL/uL    Hemoglobin 13.8  12.0 - 15.0 g/dL    HCT 54.0  98.1 - 19.1 %    MCV 85.8  78.0 - 100.0 fL    MCH 28.0  26.0 - 34.0 pg    MCHC 32.7  30.0 - 36.0 g/dL    RDW 47.8  29.5 - 62.1 %    Platelets 229  150 - 400 K/uL   PROTIME-INR     Status: Normal   Collection Time   03/09/12  7:26 AM      Component Value Range Comment   Prothrombin Time 13.1  11.6 - 15.2 seconds    INR 0.97  0.00 - 1.49    No results found.  Review of Systems  Constitutional: Negative for fever and chills.  Respiratory: Positive for cough and shortness of breath.   Cardiovascular: Negative for chest pain.  Gastrointestinal: Negative for nausea, vomiting and abdominal pain.  Musculoskeletal: Negative for back  pain.  Neurological: Negative for headaches.  Endo/Heme/Allergies: Does not bruise/bleed easily.    Blood pressure 152/91, pulse 71, temperature 98.6 F (37 C), temperature source Oral, resp. rate 16, SpO2 95.00%. Physical Exam  Constitutional: She is oriented to person, place, and time. She appears well-developed and well-nourished.  Cardiovascular: Normal rate and regular rhythm.   Respiratory: Effort normal and breath sounds normal.  GI: Soft.       Obese; few BS  Musculoskeletal: Normal range of motion. She exhibits edema.  Neurological: She is alert and oriented to person, place, and time.     Assessment/Plan Patient with history of NHL; plan is for CT guided bone marrow biopsy and port a cath placement for chemotherapy. Details/risks of procedure d/w pt/family with their understanding and consent.  ALLRED,D KEVIN 03/09/2012, 8:08 AM

## 2012-03-10 ENCOUNTER — Other Ambulatory Visit: Payer: Self-pay | Admitting: *Deleted

## 2012-03-10 MED ORDER — PREDNISONE 20 MG PO TABS: 60.0000 mg | ORAL_TABLET | Freq: Every day | ORAL | Status: DC

## 2012-03-10 MED ORDER — LIDOCAINE-PRILOCAINE 2.5-2.5 % EX CREA: TOPICAL_CREAM | CUTANEOUS | Status: DC

## 2012-03-10 MED ORDER — PROCHLORPERAZINE MALEATE 10 MG PO TABS: 10.0000 mg | ORAL_TABLET | Freq: Four times a day (QID) | ORAL | Status: DC | PRN

## 2012-03-10 NOTE — Telephone Encounter (Signed)
Left message to call office to review meds sent to pharmacy

## 2012-03-11 ENCOUNTER — Ambulatory Visit (HOSPITAL_COMMUNITY)
Admission: RE | Admit: 2012-03-11 | Discharge: 2012-03-11 | Disposition: A | Discharge status: Home / Self Care | Payer: Medicare Other | Source: Ambulatory Visit | Attending: Oncology | Admitting: Oncology

## 2012-03-11 DIAGNOSIS — I10 Essential (primary) hypertension: Secondary | ICD-10-CM | POA: Insufficient documentation

## 2012-03-11 DIAGNOSIS — C8510 Unspecified B-cell lymphoma, unspecified site: Secondary | ICD-10-CM

## 2012-03-11 DIAGNOSIS — I379 Nonrheumatic pulmonary valve disorder, unspecified: Secondary | ICD-10-CM | POA: Insufficient documentation

## 2012-03-11 DIAGNOSIS — I080 Rheumatic disorders of both mitral and aortic valves: Secondary | ICD-10-CM | POA: Insufficient documentation

## 2012-03-11 DIAGNOSIS — I359 Nonrheumatic aortic valve disorder, unspecified: Secondary | ICD-10-CM

## 2012-03-11 DIAGNOSIS — I079 Rheumatic tricuspid valve disease, unspecified: Secondary | ICD-10-CM | POA: Insufficient documentation

## 2012-03-11 DIAGNOSIS — C8589 Other specified types of non-Hodgkin lymphoma, extranodal and solid organ sites: Secondary | ICD-10-CM | POA: Insufficient documentation

## 2012-03-11 NOTE — Progress Notes (Signed)
  Echocardiogram 2D Echocardiogram has been performed.  [name], [name] 03/11/2012, 10:58 AM

## 2012-03-12 ENCOUNTER — Other Ambulatory Visit: Payer: Self-pay | Admitting: Internal Medicine

## 2012-03-13 ENCOUNTER — Other Ambulatory Visit: Payer: Self-pay | Admitting: Oncology

## 2012-03-13 MED ORDER — CEPHALEXIN 500 MG PO CAPS: 500.0000 mg | ORAL_CAPSULE | Freq: Four times a day (QID) | ORAL | Status: DC

## 2012-03-14 ENCOUNTER — Ambulatory Visit (INDEPENDENT_AMBULATORY_CARE_PROVIDER_SITE_OTHER): Payer: Medicare Other | Admitting: Internal Medicine

## 2012-03-14 ENCOUNTER — Other Ambulatory Visit: Payer: Medicare Other | Admitting: Lab

## 2012-03-14 ENCOUNTER — Encounter: Payer: Self-pay | Admitting: Internal Medicine

## 2012-03-14 ENCOUNTER — Ambulatory Visit (HOSPITAL_BASED_OUTPATIENT_CLINIC_OR_DEPARTMENT_OTHER): Payer: Medicare Other | Admitting: Nurse Practitioner

## 2012-03-14 ENCOUNTER — Other Ambulatory Visit (HOSPITAL_COMMUNITY): Payer: Medicare Other

## 2012-03-14 ENCOUNTER — Telehealth: Payer: Self-pay | Admitting: Oncology

## 2012-03-14 ENCOUNTER — Ambulatory Visit: Payer: Medicare Other | Admitting: Internal Medicine

## 2012-03-14 ENCOUNTER — Telehealth: Payer: Self-pay | Admitting: *Deleted

## 2012-03-14 VITALS — BP 165/94 | HR 82 | Temp 98.2°F | Resp 18 | Ht 64.5 in | Wt 196.1 lb

## 2012-03-14 VITALS — BP 140/80 | HR 76 | Temp 98.5°F | Resp 18 | Ht 64.5 in | Wt 194.0 lb

## 2012-03-14 DIAGNOSIS — F419 Anxiety disorder, unspecified: Secondary | ICD-10-CM

## 2012-03-14 DIAGNOSIS — Z8521 Personal history of malignant neoplasm of larynx: Secondary | ICD-10-CM

## 2012-03-14 DIAGNOSIS — R5381 Other malaise: Secondary | ICD-10-CM

## 2012-03-14 DIAGNOSIS — C8589 Other specified types of non-Hodgkin lymphoma, extranodal and solid organ sites: Secondary | ICD-10-CM

## 2012-03-14 DIAGNOSIS — C8510 Unspecified B-cell lymphoma, unspecified site: Secondary | ICD-10-CM

## 2012-03-14 DIAGNOSIS — R61 Generalized hyperhidrosis: Secondary | ICD-10-CM

## 2012-03-14 DIAGNOSIS — J4489 Other specified chronic obstructive pulmonary disease: Secondary | ICD-10-CM

## 2012-03-14 DIAGNOSIS — J449 Chronic obstructive pulmonary disease, unspecified: Secondary | ICD-10-CM

## 2012-03-14 DIAGNOSIS — F411 Generalized anxiety disorder: Secondary | ICD-10-CM

## 2012-03-14 DIAGNOSIS — I10 Essential (primary) hypertension: Secondary | ICD-10-CM

## 2012-03-14 DIAGNOSIS — D649 Anemia, unspecified: Secondary | ICD-10-CM

## 2012-03-14 DIAGNOSIS — R5383 Other fatigue: Secondary | ICD-10-CM

## 2012-03-14 MED ORDER — ALPRAZOLAM 0.5 MG PO TABS: 0.5000 mg | ORAL_TABLET | Freq: Three times a day (TID) | ORAL | Status: DC | PRN

## 2012-03-14 NOTE — Telephone Encounter (Signed)
appts made and printed for pt  °

## 2012-03-14 NOTE — Patient Instructions (Signed)
The patient is instructed to continue all medications as prescribed. Schedule followup with check out clerk upon leaving the clinic  

## 2012-03-14 NOTE — Progress Notes (Signed)
OFFICE PROGRESS NOTE  Interval history:  [name] returns as scheduled. She underwent placement of a Port-A-Cath on 03/09/2012. Bone marrow aspiration/biopsy also on 03/09/2012 was negative for involvement with lymphoma. 2-D echo on 03/11/2012 showed a left ventricular ejection fraction of 60-65%.  She continues to have night sweats. No fever. Appetite is stable.   Objective: Blood pressure 165/94, pulse 82, temperature 98.2 F (36.8 C), temperature source Oral, resp. rate 18, height 5' 4.5" (1.638 m), weight 196 lb 1.6 oz (88.95 kg).  Oropharynx is without thrush or ulceration. Lungs are clear. Regular cardiac rhythm. Port-A-Cath site with surrounding ecchymosis. Inferior and lateral to the Port-A-Cath site there are 2 areas of raised erythema in a linear distribution consistent with a tape reaction.  Lab Results: Lab Results  Component Value Date   WBC 4.4 03/09/2012   HGB 13.8 03/09/2012   HCT 42.2 03/09/2012   MCV 85.8 03/09/2012   PLT 229 03/09/2012    Chemistry:    Chemistry      Component Value Date/Time   NA 139 02/25/2012 0804   K 4.4 02/25/2012 0804   CL 106 02/25/2012 0804   CO2 22 02/25/2012 0804   BUN 19 02/25/2012 0804   CREATININE 1.10 02/25/2012 0804      Component Value Date/Time   CALCIUM 9.7 02/25/2012 0804   ALKPHOS 62 02/25/2012 0804   AST 36 02/25/2012 0804   ALT 40* 02/25/2012 0804   BILITOT 0.6 02/25/2012 0804       Studies/Results: Nm Pet Image Initial (pi) Skull Base To Thigh  02/25/2012  *RADIOLOGY REPORT*  Clinical Data: Initial treatment strategy for large cell non- Hodgkins lymphoma.  NUCLEAR MEDICINE PET SKULL BASE TO THIGH  Fasting Blood Glucose:  90  Technique:  15 mCi F-18 FDG was injected intravenously. CT data was obtained and used for attenuation correction and anatomic localization only.  (This was not acquired as a diagnostic CT examination.) Additional exam technical data entered on technologist worksheet.  Comparison:  CT abdomen and pelvis  01/14/2012.  Chest CT 10/12/2008.  Findings:  Neck: Small lymph node posterolateral to the oropharynx on the left (image 22 of series 2) measuring 9 x 7 mm (SUVmax = 5.4)No other pathologic enlarged nodes or hypermetabolic lymph nodes within the neck.  Chest: There is a large cluster of hypermetabolic lymph nodes in the right hilum (SUVmax = 4.3).  No suspicious appearing pulmonary nodules or masses are identified. Heart size is mildly enlarged. There is atherosclerosis of the thoracic aorta, the great vessels of the mediastinum and the coronary arteries, including calcified atherosclerotic plaque in the left main, left anterior descending, left circumflex and right coronary arteries. Esophagus is unremarkable in appearance.  Abdomen/Pelvis: There is a cluster of mildly enlarged hepatoduodenal ligament lymph nodes and mildly enlarged portacaval lymph node (1.4 cm short axis) with hypermetabolic activity (SUVmax = 37.1).  Numerous borderline enlarged and mildly enlarged periaortic retroperitoneal lymph nodes are present with extensive hypermetabolic activity (SUVmax = 43.6 - 47.6).  There are numerous borderline enlarged and mildly enlarged hypermetabolic lymph nodes along the pelvic side wall bilaterally, most pronounced on the right, where there is a right external iliac lymph node measuring 2.9 x 4.1 cm (SUVmax = 52.5).  Cholelithiasis without evidence to suggest acute cholecystitis. The unenhanced appearance of the liver, pancreas, spleen, bilateral adrenal glands and bilateral kidneys is unremarkable.  No ascites or pneumoperitoneum and no pathologic distension of bowel.  There are numerous colonic diverticula, without surrounding inflammatory changes to  suggest an acute diverticulitis at this time. Postoperative changes of partial sigmoidectomy are noted with a suture line at the rectosigmoid junction.  Uterus is mildly heterogeneous, suggesting fibroids.  Ovaries are unremarkable in appearance.  Urinary  bladder is normal in appearance.  Extensive atherosclerosis, including fusiform ectasia of the infrarenal abdominal aorta measuring up to 2.7 x 2.6 cm.  [place]:  No focal hypermetabolic activity to suggest skeletal metastasis.  IMPRESSION: 1.  Extensive hypermetabolic lymphadenopathy, as detailed above, compatible with widespread lymphoma. 2.  Extensive atherosclerosis, including left main and three vessel coronary artery disease. 3.  Cholelithiasis without evidence to suggest acute cholecystitis. 4.  Mild colonic diverticulosis without findings to suggest acute diverticulitis. 6.  Status post partial sigmoidectomy. 7.  Additional incidental findings, as above.  Original Report Authenticated By: [name], M.D.   Ir Fluoro Guide Cv Line Right  03/09/2012  *RADIOLOGY REPORT*  Indication: History of non-Hodgkin's lymphoma, in need of porta- catheter for chemotherapy administration  IMPLANTED PORT A CATH PLACEMENT WITH ULTRASOUND AND FLUOROSCOPIC GUIDANCE  Sedation: Versed 2 mg IV; Fentanyl 100 mcg IV; Ancef 2 gm IV; IV antibiotic was given in an appropriate time interval prior to skin puncture.  Total Moderate Sedation Time: 25 minutes.  Contrast: None  Fluoroscopy Time: 0.4 minutes.  Complications: None immediate  Procedure:  The procedure, risks, benefits, and alternatives were explained to the patient.  Questions regarding the procedure were encouraged and answered.  The patient understands and consents to the procedure.  The right neck and chest were prepped with chlorhexidine in a sterile fashion, and a sterile drape was applied covering the operative field.  Maximum barrier sterile technique with sterile gowns and gloves were used for the procedure.  A timeout was performed prior to the initiation of the procedure.  Local anesthesia was provided with 1% lidocaine with epinephrine.  After creating a small venotomy incision, a micropuncture kit was utilized to access the right internal jugular vein  under direct, real-time ultrasound guidance.  Ultrasound image documentation was performed.  The microwire was kinked to measure appropriate catheter length.  A subcutaneous port pocket was then created along the upper chest wall utilizing a combination of sharp and blunt dissection.  The pocket was irrigated with sterile saline.  A single lumen power injectable port was chosen for placement.  The 8 Fr catheter was tunneled from the port pocket site to the venotomy incision.  The port was placed in the pocket.  The external catheter was trimmed to appropriate length.  At the venotomy, an 8 Fr peel-away sheath was placed over a guidewire under fluoroscopic guidance.  The catheter was then placed through the sheath and the sheath was removed.  Final catheter positioning was confirmed and documented with a fluoroscopic spot radiograph.  The port was accessed with a Huber needle, aspirated and flushed with heparinized saline.  The venotomy site was closed with an interrupted 4-0 Vicryl suture. The port pocket incision was closed with interrupted 2-0 Vicryl suture and the skin was opposed with a running subcuticular 4-0 Vicryl suture.  Dermabond and Steri-strips were applied to both incisions.  Dressings were placed.  The patient tolerated the procedure well without immediate post procedural complication.  Findings:  After catheter placement, the tip lies at the superior aspect of the right atrium.  The catheter aspirates and flushes normally and is ready for immediate use.  IMPRESSION:  Successful placement of a right internal jugular approach single lumen power injectable Port-A-Cath.  The catheter  is ready for immediate use.  Original Report Authenticated By: Waynard Reeds, M.D.   Ir US Guide Vasc Access Right  03/09/2012  *RADIOLOGY REPORT*  Indication: History of non-Hodgkin's lymphoma, in need of porta- catheter for chemotherapy administration  IMPLANTED PORT A CATH PLACEMENT WITH ULTRASOUND AND FLUOROSCOPIC  GUIDANCE  Sedation: Versed 2 mg IV; Fentanyl 100 mcg IV; Ancef 2 gm IV; IV antibiotic was given in an appropriate time interval prior to skin puncture.  Total Moderate Sedation Time: 25 minutes.  Contrast: None  Fluoroscopy Time: 0.4 minutes.  Complications: None immediate  Procedure:  The procedure, risks, benefits, and alternatives were explained to the patient.  Questions regarding the procedure were encouraged and answered.  The patient understands and consents to the procedure.  The right neck and chest were prepped with chlorhexidine in a sterile fashion, and a sterile drape was applied covering the operative field.  Maximum barrier sterile technique with sterile gowns and gloves were used for the procedure.  A timeout was performed prior to the initiation of the procedure.  Local anesthesia was provided with 1% lidocaine with epinephrine.  After creating a small venotomy incision, a micropuncture kit was utilized to access the right internal jugular vein under direct, real-time ultrasound guidance.  Ultrasound image documentation was performed.  The microwire was kinked to measure appropriate catheter length.  A subcutaneous port pocket was then created along the upper chest wall utilizing a combination of sharp and blunt dissection.  The pocket was irrigated with sterile saline.  A single lumen power injectable port was chosen for placement.  The 8 Fr catheter was tunneled from the port pocket site to the venotomy incision.  The port was placed in the pocket.  The external catheter was trimmed to appropriate length.  At the venotomy, an 8 Fr peel-away sheath was placed over a guidewire under fluoroscopic guidance.  The catheter was then placed through the sheath and the sheath was removed.  Final catheter positioning was confirmed and documented with a fluoroscopic spot radiograph.  The port was accessed with a Huber needle, aspirated and flushed with heparinized saline.  The venotomy site was closed with an  interrupted 4-0 Vicryl suture. The port pocket incision was closed with interrupted 2-0 Vicryl suture and the skin was opposed with a running subcuticular 4-0 Vicryl suture.  Dermabond and Steri-strips were applied to both incisions.  Dressings were placed.  The patient tolerated the procedure well without immediate post procedural complication.  Findings:  After catheter placement, the tip lies at the superior aspect of the right atrium.  The catheter aspirates and flushes normally and is ready for immediate use.  IMPRESSION:  Successful placement of a right internal jugular approach single lumen power injectable Port-A-Cath.  The catheter is ready for immediate use.  Original Report Authenticated By: Waynard Reeds, M.D.   Ct Biopsy  03/09/2012  *RADIOLOGY REPORT*  Indication: New diagnosis of large B-cell lymphoma, evaluate for non-Hodgkin's lymphoma.  CT GUIDED LEFT ILIAC BONE MARROW ASPIRATION AND BONE MARROW CORE BIOPSIES  Intravenous medications: Fentanyl 100 mcg IV; Versed 1 mg IV  Sedation time: 10 minutes  Contrast volume: None  Complications: None immediate  PROCEDURE/FINDINGS:  Informed consent was obtained from the patient following an explanation of the procedure, risks, benefits and alternatives. The patient understands, agrees and consents for the procedure. All questions were addressed.  A time out was performed prior to the initiation of the procedure.  The patient was positioned prone  and noncontrast localization CT was performed of the pelvis to demonstrate the iliac marrow spaces.  The operative site was prepped and draped in the usual sterile fashion.  Under sterile conditions and local anesthesia, an 11 gauge coaxial bone biopsy needle was advanced into the left iliac marrow space. Needle position was confirmed with CT imaging.  Initially, bone marrow aspiration was performed. Next, a bone marrow biopsy was obtained with the 11 gauge outer bone marrow device.  Samples were prepared with the  cytotechnologist and deemed adequate.  The needle was removed intact.  Hemostasis was obtained with compression and a dressing was placed. The patient tolerated the procedure well without immediate post procedural complication.  IMPRESSION:  Successful CT guided left iliac bone marrow aspiration and core biopsy.  Original Report Authenticated By: Waynard Reeds, M.D.    Medications: I have reviewed the patient's current medications.  Assessment/Plan:  1. Non-Hodgkin's lymphoma, large B-cell lymphoma, CD20 positive involving 8 needle core biopsy of a right iliac lymph node 02/12/2012. CT of the abdomen and pelvis on 01/15/2012 showed abdominal/pelvic lymphadenopathy. Serum LDH in normal range on 02/25/2012. Staging PET scan 02/25/2012 with a small lymph node posteriolateral to the oropharynx on the left measuring 9 x 7 mm with SUV 5.4; large cluster of hypermetabolic lymph nodes in the right hilum with SUV 4.3; cluster of mildly enlarged hepatoduodenal ligament lymph nodes and mildly enlarged portacaval lymph node with hypermetabolic activity (SUV max 37.1); numerous borderline enlarged and mildly enlarged periaortic retroperitoneal lymph nodes with extensive hypermetabolic activity (SUV max 43.6-47.6); numerous borderline enlarged and mildly enlarged hypermetabolic lymph nodes along the pelvic sidewall bilaterally most pronounced on the right with a right external iliac lymph node measuring 2.9 x 4.1 cm (SUV max 52.5). Bone marrow aspiration/biopsy 03/09/2012 negative for involvement with lymphoma. 2. Night sweats likely secondary to #1. 3. Rectal bleeding June 2013 status post negative colonoscopy 01/28/2012 and upper endoscopy 02/11/2012. The bleeding was likely related to hemorrhoids. 4. COPD. 5. Remote history of larynx cancer. 6. Status post Port-A-Cath placement 03/09/2012. 7. 2-D echo 03/11/2012 with left ventricular ejection fraction 60-65%.  Disposition-Ms. Liss appears stable. She has  attended the chemotherapy education class. She is scheduled to proceed with cycle 1 CHOP/Rituxan 03/15/2012 with Neulasta support. She will return for a followup visit and cycle 2 on 04/05/2012. She will contact the office in the interim with any problems.  Plan reviewed with Dr. Truett Perna.  [name] ANP/GNP-BC

## 2012-03-14 NOTE — Telephone Encounter (Signed)
Patient called requesting to have prednisone called in.  To begin tomorrow with treatment.  [facility], they did receive the order on 03-10-2012 but it was placed on hold.  Asked why and no reason known to Scarlet.  Asked tat she have this filled so patient can pick up to begin tomorrow.  Patient notified to pick up prednisone later today.

## 2012-03-14 NOTE — Progress Notes (Signed)
Subjective:    Patient ID: [name], female    DOB: [birth year], 78 y.o.   MRN: [id number]  HPI Patient presents for followup after her diagnosis of B cell lymphoma.  She had bone marrow biopsy we reviewed the results of the biopsy the pathologist reports that C. no tumor in the bone marrow.  Flow cytometry the bone marrow was also negative.  An echocardiogram before she had chemotherapy she is initiating chemotherapy she has her Port-A-Cath in place.  She'll have 6 rounds of chemotherapy and prognosis is apparently good   Review of Systems  Constitutional: Negative for activity change, appetite change and fatigue.  HENT: Negative for ear pain, congestion, neck pain, postnasal drip and sinus pressure.   Eyes: Negative for redness and visual disturbance.  Respiratory: Negative for cough, shortness of breath and wheezing.   Gastrointestinal: Negative for abdominal pain and abdominal distention.  Genitourinary: Negative for dysuria, frequency and menstrual problem.  Musculoskeletal: Negative for myalgias, joint swelling and arthralgias.  Skin: Negative for rash and wound.  Neurological: Negative for dizziness, weakness and headaches.  Hematological: Negative for adenopathy. Does not bruise/bleed easily.  Psychiatric/Behavioral: Negative for disturbed wake/sleep cycle and decreased concentration.   Past Medical History  Diagnosis Date  . GERD (gastroesophageal reflux disease)   . Arthritis   . Stroke 10/2002    no residual  . History of TIAs   . Diverticulosis   . PUD (peptic ulcer disease)   . PVD (peripheral vascular disease)   . Hyperlipidemia   . Anxiety   . Depression   . 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 Blood transfusion   . COPD (chronic obstructive pulmonary disease)     History   Social History  . Marital Status: Married    Spouse Name: N/A    Number of Children: 6  . Years of Education: N/A    Occupational History  . retired    Social History Main Topics  . Smoking status: Former Games developer  . Smokeless tobacco: Never Used  . Alcohol Use: No  . Drug Use: No  . Sexually Active: Yes   Other Topics Concern  . Not on file   Social History Narrative  . No narrative on file    Past Surgical History  Procedure Date  . Appendectomy   . Colon surgery 09/13/2008    iliostomy for diverticulitis  . Hernia repair   . Tubal ligation   . Throat surgery 1986  . Total knee arthroplasty     2002 left 2008 right  . Reversal of iliostomy 01/2010  . Cataract extraction 2005    bilateral  . Carotid surgery     2004 a month apart right and left    Family History  Problem Relation Age of Onset  . Colon cancer Sister     colon  . Hyperlipidemia Mother   . Stroke Mother   . Cancer Father     mesothelioma    Allergies  Allergen Reactions  . Propoxyphene Hcl   . Propoxyphene-Acetaminophen     REACTION: Reaction not known    Current Outpatient Prescriptions on File Prior to Visit  Medication Sig Dispense Refill  . bisoprolol (ZEBETA) 5 MG tablet Take 5 mg by mouth daily at 12 noon.      . cephALEXin (KEFLEX) 500 MG capsule Take 1 capsule (500 mg total) by mouth 4 (four) times daily.  30 capsule  0  .  clotrimazole-betamethasone (LOTRISONE) lotion Apply 1 application topically 2 (two) times daily as needed. For psoriasis      . diltiazem (CARDIZEM) 30 MG tablet Take 1 tablet (30 mg total) by mouth 3 (three) times daily.  90 tablet  11  . lidocaine-prilocaine (EMLA) cream Apply to port-a-cath site 1-2 hours prior to chemo.  Cover with plastic wrap  30 g  1  . Multiple Vitamin (MULTIVITAMIN WITH MINERALS) TABS Take 1 tablet by mouth daily.      Marland Kitchen omeprazole (PRILOSEC) 40 MG capsule Take 40 mg by mouth daily as needed. Heart burn      . predniSONE (DELTASONE) 20 MG tablet Take 3 tablets (60 mg total) by mouth daily. For 5 days beginning day one of chemo.  15 tablet  3  .  prochlorperazine (COMPAZINE) 10 MG tablet Take 1 tablet (10 mg total) by mouth every 6 (six) hours as needed.  30 tablet  3  . SPIRIVA HANDIHALER 18 MCG inhalation capsule INHALE ONE DOSE EVERY DAY  30 each  6  . Vitamin D, Ergocalciferol, (DRISDOL) 50000 UNITS CAPS Take 50,000 Units by mouth every 7 (seven) days. Taken on Sundays.      Marland Kitchen warfarin (COUMADIN) 5 MG tablet Take 7.5 mg by mouth at bedtime.        BP 140/80  Pulse 76  Temp 98.5 F (36.9 C)  Resp 18  Ht 5' 4.5" (1.638 m)  Wt 194 lb (87.998 kg)  BMI 32.79 kg/m2       Objective:   Physical Exam  Nursing note and vitals reviewed. Constitutional: She is oriented to person, place, and time. She appears well-developed and well-nourished.  HENT:  Head: Normocephalic and atraumatic.  Neck: Normal range of motion. Neck supple.  Cardiovascular: Normal rate.   Murmur heard. Abdominal: Soft. Bowel sounds are normal.  Musculoskeletal: She exhibits edema and tenderness.  Neurological: She is alert and oriented to person, place, and time.  Skin: Skin is warm and dry.          Assessment & Plan:  We reviewed the results of her bone marrow and her echocardiogram.  I believe that she is an excellent candidate for chemotherapy and I believe we have an excellent chance of a good result.  She has good spirits and in good the finding well.  I will deferred to Dr. Truett Perna for monitoring labs at this point since he is going to be listing to chemotherapy we will continue to follow the patient for other issues including making sure that no anxiety or depression affect her outcome

## 2012-03-15 ENCOUNTER — Ambulatory Visit (HOSPITAL_BASED_OUTPATIENT_CLINIC_OR_DEPARTMENT_OTHER): Payer: Medicare Other

## 2012-03-15 VITALS — BP 132/77 | HR 97 | Temp 98.1°F | Resp 20

## 2012-03-15 DIAGNOSIS — C8510 Unspecified B-cell lymphoma, unspecified site: Secondary | ICD-10-CM

## 2012-03-15 DIAGNOSIS — Z5112 Encounter for antineoplastic immunotherapy: Secondary | ICD-10-CM

## 2012-03-15 DIAGNOSIS — C8589 Other specified types of non-Hodgkin lymphoma, extranodal and solid organ sites: Secondary | ICD-10-CM

## 2012-03-15 DIAGNOSIS — Z5111 Encounter for antineoplastic chemotherapy: Secondary | ICD-10-CM

## 2012-03-15 MED ORDER — SODIUM CHLORIDE 0.9 % IJ SOLN
10.0000 mL | INTRAMUSCULAR | Status: DC | PRN
  Administered 2012-03-15: 10 mL
  Filled 2012-03-15: qty 10

## 2012-03-15 MED ORDER — VINCRISTINE SULFATE CHEMO INJECTION 1 MG/ML
2.0000 mg | Freq: Once | INTRAVENOUS | Status: AC
  Administered 2012-03-15: 2 mg via INTRAVENOUS
  Filled 2012-03-15: qty 2

## 2012-03-15 MED ORDER — SODIUM CHLORIDE 0.9 % IV SOLN
375.0000 mg/m2 | Freq: Once | INTRAVENOUS | Status: AC
  Administered 2012-03-15: 800 mg via INTRAVENOUS
  Filled 2012-03-15: qty 80

## 2012-03-15 MED ORDER — SODIUM CHLORIDE 0.9 % IV SOLN
Freq: Once | INTRAVENOUS | Status: AC
  Administered 2012-03-15: 09:00:00 via INTRAVENOUS

## 2012-03-15 MED ORDER — ACETAMINOPHEN 325 MG PO TABS
650.0000 mg | ORAL_TABLET | Freq: Once | ORAL | Status: AC
  Administered 2012-03-15: 650 mg via ORAL

## 2012-03-15 MED ORDER — ONDANSETRON 16 MG/50ML IVPB (CHCC)
16.0000 mg | Freq: Once | INTRAVENOUS | Status: AC
  Administered 2012-03-15: 16 mg via INTRAVENOUS

## 2012-03-15 MED ORDER — DOXORUBICIN HCL CHEMO IV INJECTION 2 MG/ML
50.0000 mg/m2 | Freq: Once | INTRAVENOUS | Status: AC
  Administered 2012-03-15: 100 mg via INTRAVENOUS
  Filled 2012-03-15: qty 50

## 2012-03-15 MED ORDER — DIPHENHYDRAMINE HCL 25 MG PO CAPS
50.0000 mg | ORAL_CAPSULE | Freq: Once | ORAL | Status: AC
  Administered 2012-03-15: 50 mg via ORAL

## 2012-03-15 MED ORDER — SODIUM CHLORIDE 0.9 % IV SOLN
750.0000 mg/m2 | Freq: Once | INTRAVENOUS | Status: AC
  Administered 2012-03-15: 1500 mg via INTRAVENOUS
  Filled 2012-03-15: qty 75

## 2012-03-15 MED ORDER — HEPARIN SOD (PORK) LOCK FLUSH 100 UNIT/ML IV SOLN
500.0000 [IU] | Freq: Once | INTRAVENOUS | Status: AC | PRN
  Administered 2012-03-15: 500 [IU]
  Filled 2012-03-15: qty 5

## 2012-03-15 NOTE — Patient Instructions (Signed)
[facility] Discharge Instructions for Patients Receiving Chemotherapy  Today you received the following chemotherapy agents Rituxan, Cytoxan, Adriamycin, Vincristine  To help prevent nausea and vomiting after your treatment, we encourage you to take your nausea medication Compazine.    If you develop nausea and vomiting that is not controlled by your nausea medication, call the clinic. If it is after clinic hours your family physician or the after hours number for the clinic or go to the Emergency Department.   BELOW ARE SYMPTOMS THAT SHOULD BE REPORTED IMMEDIATELY:  *FEVER GREATER THAN 100.5 F  *CHILLS WITH OR WITHOUT FEVER  NAUSEA AND VOMITING THAT IS NOT CONTROLLED WITH YOUR NAUSEA MEDICATION  *UNUSUAL SHORTNESS OF BREATH  *UNUSUAL BRUISING OR BLEEDING  TENDERNESS IN MOUTH AND THROAT WITH OR WITHOUT PRESENCE OF ULCERS  *URINARY PROBLEMS  *BOWEL PROBLEMS  UNUSUAL RASH Items with * indicate a potential emergency and should be followed up as soon as possible.  One of the nurses will contact you 24 hours after your treatment. Please let the nurse know about any problems that you may have experienced. Feel free to call the clinic you have any questions or concerns. The clinic phone number is [phone].   I have been informed and understand all the instructions given to me. I know to contact the clinic, my physician, or go to the Emergency Department if any problems should occur. I do not have any questions at this time, but understand that I may call the clinic during office hours   should I have any questions or need assistance in obtaining follow up care.    __________________________________________  _____________  __________ Signature of Patient or Authorized Representative            Date                   Time    __________________________________________ Nurse's Signature

## 2012-03-16 ENCOUNTER — Telehealth: Payer: Self-pay | Admitting: *Deleted

## 2012-03-16 ENCOUNTER — Ambulatory Visit (HOSPITAL_BASED_OUTPATIENT_CLINIC_OR_DEPARTMENT_OTHER): Payer: Medicare Other

## 2012-03-16 VITALS — BP 92/58 | HR 93 | Temp 98.1°F

## 2012-03-16 DIAGNOSIS — C8589 Other specified types of non-Hodgkin lymphoma, extranodal and solid organ sites: Secondary | ICD-10-CM

## 2012-03-16 DIAGNOSIS — C8510 Unspecified B-cell lymphoma, unspecified site: Secondary | ICD-10-CM

## 2012-03-16 MED ORDER — PEGFILGRASTIM INJECTION 6 MG/0.6ML
6.0000 mg | Freq: Once | SUBCUTANEOUS | Status: AC
  Administered 2012-03-16: 6 mg via SUBCUTANEOUS
  Filled 2012-03-16: qty 0.6

## 2012-03-16 NOTE — Telephone Encounter (Signed)
Reports BP at home this afternoon 80/46 (asymptomatic) and few hours later was 106/70. Did not take her bisoprolol today and has not taken her diltiazem doses since this am dose. Reports the diltiazem is for BP and not arrhythmia. She is eating and drinking well. Instructed her to hold her BP meds today and check her BP in am and call office with the reading.

## 2012-03-16 NOTE — Telephone Encounter (Signed)
Patient, husband and daughter here for appointment.  Receiving Neulasta injection following 1st chemo treatment (rchop).  States is having a little nausea but no vomiting or diarrhea.  Has taken an antiemetic,  Encouraged to take as needed.  Stated that she is drinking and eating.   Patient concerned about her Prednisone,  She only took one 20mg  tablet yesterday.  Her prescription is for 20mg  3 tablets daily for 5 days, starting on day of chemo.  I told her to not try to catch up on the missed dose from yesterday, just continues the correct dose for the next 4 days. All questions answered and pt and family knows to call with any problems, questions or concerns.

## 2012-03-21 ENCOUNTER — Encounter (HOSPITAL_COMMUNITY): Payer: Self-pay | Admitting: *Deleted

## 2012-03-21 ENCOUNTER — Emergency Department (HOSPITAL_COMMUNITY)
Admission: EM | Admit: 2012-03-21 | Discharge: 2012-03-21 | Disposition: A | Discharge status: Home / Self Care | Payer: Medicare Other | Attending: Emergency Medicine | Admitting: Emergency Medicine

## 2012-03-21 ENCOUNTER — Other Ambulatory Visit: Payer: Self-pay | Admitting: *Deleted

## 2012-03-21 ENCOUNTER — Telehealth: Payer: Self-pay | Admitting: Oncology

## 2012-03-21 ENCOUNTER — Emergency Department (HOSPITAL_COMMUNITY): Payer: Medicare Other

## 2012-03-21 DIAGNOSIS — K219 Gastro-esophageal reflux disease without esophagitis: Secondary | ICD-10-CM | POA: Insufficient documentation

## 2012-03-21 DIAGNOSIS — J4489 Other specified chronic obstructive pulmonary disease: Secondary | ICD-10-CM | POA: Insufficient documentation

## 2012-03-21 DIAGNOSIS — R079 Chest pain, unspecified: Secondary | ICD-10-CM | POA: Insufficient documentation

## 2012-03-21 DIAGNOSIS — D696 Thrombocytopenia, unspecified: Secondary | ICD-10-CM

## 2012-03-21 DIAGNOSIS — Z8673 Personal history of transient ischemic attack (TIA), and cerebral infarction without residual deficits: Secondary | ICD-10-CM | POA: Insufficient documentation

## 2012-03-21 DIAGNOSIS — D72819 Decreased white blood cell count, unspecified: Secondary | ICD-10-CM

## 2012-03-21 DIAGNOSIS — R0602 Shortness of breath: Secondary | ICD-10-CM | POA: Insufficient documentation

## 2012-03-21 DIAGNOSIS — C8510 Unspecified B-cell lymphoma, unspecified site: Secondary | ICD-10-CM

## 2012-03-21 DIAGNOSIS — J449 Chronic obstructive pulmonary disease, unspecified: Secondary | ICD-10-CM | POA: Insufficient documentation

## 2012-03-21 DIAGNOSIS — K209 Esophagitis, unspecified without bleeding: Secondary | ICD-10-CM

## 2012-03-21 DIAGNOSIS — Z8739 Personal history of other diseases of the musculoskeletal system and connective tissue: Secondary | ICD-10-CM | POA: Insufficient documentation

## 2012-03-21 DIAGNOSIS — Z9089 Acquired absence of other organs: Secondary | ICD-10-CM | POA: Insufficient documentation

## 2012-03-21 DIAGNOSIS — Z87891 Personal history of nicotine dependence: Secondary | ICD-10-CM | POA: Insufficient documentation

## 2012-03-21 DIAGNOSIS — F341 Dysthymic disorder: Secondary | ICD-10-CM | POA: Insufficient documentation

## 2012-03-21 HISTORY — DX: Mantle cell lymphoma, lymph nodes of inguinal region and lower limb: C83.15

## 2012-03-21 LAB — URINE MICROSCOPIC-ADD ON

## 2012-03-21 LAB — URINALYSIS, ROUTINE W REFLEX MICROSCOPIC
Bilirubin Urine: NEGATIVE
Glucose, UA: NEGATIVE mg/dL
Specific Gravity, Urine: 1.021 (ref 1.005–1.030)

## 2012-03-21 LAB — COMPREHENSIVE METABOLIC PANEL
ALT: 46 U/L — ABNORMAL HIGH (ref 0–35)
AST: 34 U/L (ref 0–37)
Albumin: 3.6 g/dL (ref 3.5–5.2)
Calcium: 8.9 mg/dL (ref 8.4–10.5)
Chloride: 98 mEq/L (ref 96–112)
Creatinine, Ser: 1.09 mg/dL (ref 0.50–1.10)
Sodium: 133 mEq/L — ABNORMAL LOW (ref 135–145)

## 2012-03-21 LAB — CBC WITH DIFFERENTIAL/PLATELET
Band Neutrophils: 0 % (ref 0–10)
Eosinophils Absolute: 0 10*3/uL (ref 0.0–0.7)
Eosinophils Relative: 0 % (ref 0–5)
HCT: 38.7 % (ref 36.0–46.0)
Hemoglobin: 12.6 g/dL (ref 12.0–15.0)
Lymphocytes Relative: 0 % — ABNORMAL LOW (ref 12–46)
MCV: 86 fL (ref 78.0–100.0)
Monocytes Relative: 0 % — ABNORMAL LOW (ref 3–12)
RBC: 4.5 MIL/uL (ref 3.87–5.11)
RDW: 14.7 % (ref 11.5–15.5)
WBC: 0.8 10*3/uL — CL (ref 4.0–10.5)
nRBC: 0 /100 WBC

## 2012-03-21 LAB — POCT I-STAT TROPONIN I: Troponin i, poc: 0 ng/mL (ref 0.00–0.08)

## 2012-03-21 MED ORDER — FLUCONAZOLE 200 MG PO TABS: 100.0000 mg | ORAL_TABLET | Freq: Every day | ORAL | Status: DC

## 2012-03-21 MED ORDER — LIDOCAINE VISCOUS 2 % MT SOLN
20.0000 mL | OROMUCOSAL | Status: AC
  Administered 2012-03-21: 20 mL via OROMUCOSAL
  Filled 2012-03-21: qty 20

## 2012-03-21 MED ORDER — CIPROFLOXACIN HCL 500 MG PO TABS: 500.0000 mg | ORAL_TABLET | Freq: Two times a day (BID) | ORAL | Status: DC

## 2012-03-21 MED ORDER — FLUCONAZOLE 200 MG PO TABS
200.0000 mg | ORAL_TABLET | ORAL | Status: AC
  Administered 2012-03-21: 200 mg via ORAL
  Filled 2012-03-21: qty 1

## 2012-03-21 MED ORDER — SODIUM CHLORIDE 0.9 % IV BOLUS (SEPSIS)
500.0000 mL | Freq: Once | INTRAVENOUS | Status: AC
  Administered 2012-03-21: 500 mL via INTRAVENOUS

## 2012-03-21 NOTE — ED Notes (Signed)
Family at bedside. 

## 2012-03-21 NOTE — Progress Notes (Signed)
Per Dr. Truett Perna: pt will need to come in 8/20 to be evaluated by NP. Orders entered for pt to see Belenda Cruise, NP.

## 2012-03-21 NOTE — ED Provider Notes (Signed)
History     CSN: [id number]  Arrival date & time 03/21/12  0747   First MD Initiated Contact with Patient 03/21/12 [phone]      Chief Complaint  Patient presents with  . Chest Pain    (Consider location/radiation/quality/duration/timing/severity/associated sxs/prior treatment) HPI Pt started chemotherapy last week for B-cell lymphoma. She was feeling well initially. She began having worse than usual sore throat and pain with swallowing. Her daughter came over this morning and states pt was not her normal self, seemed less focused. She complained of fleeting R breast discomfort which has since resolved. Pt is tolerating secretions. No SOB or cough. No fever or chills.  Past Medical History  Diagnosis Date  . GERD (gastroesophageal reflux disease)   . Arthritis   . Stroke 10/2002    no residual  . History of TIAs   . Diverticulosis   . PUD (peptic ulcer disease)   . PVD (peripheral vascular disease)   . Hyperlipidemia   . Anxiety   . Depression   . 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 Blood transfusion   . COPD (chronic obstructive pulmonary disease)   . Lymphona, mantle cell, inguinal region/lower limb     Past Surgical History  Procedure Date  . Appendectomy   . Colon surgery 09/13/2008    iliostomy for diverticulitis  . Hernia repair   . Tubal ligation   . Throat surgery 1986  . Total knee arthroplasty     2002 left 2008 right  . Reversal of iliostomy 01/2010  . Cataract extraction 2005    bilateral  . Carotid surgery     2004 a month apart right and left    Family History  Problem Relation Age of Onset  . Colon cancer Sister     colon  . Hyperlipidemia Mother   . Stroke Mother   . Cancer Father     mesothelioma    History  Substance Use Topics  . Smoking status: Former Games developer  . Smokeless tobacco: Never Used  . Alcohol Use: No    OB History    Grav Para Term Preterm Abortions TAB SAB Ect Mult Living                    Review of Systems  Constitutional: Negative for fever and chills.  HENT: Positive for sore throat, mouth sores and trouble swallowing. Negative for congestion and rhinorrhea.   Respiratory: Negative for shortness of breath and wheezing.   Cardiovascular: Positive for chest pain. Negative for palpitations and leg swelling.  Gastrointestinal: Positive for nausea. Negative for vomiting, abdominal pain and diarrhea.  Musculoskeletal: Negative for back pain.  Skin: Negative for rash and wound.  Neurological: Negative for dizziness, weakness, light-headedness, numbness and headaches.  Psychiatric/Behavioral: Positive for confusion.    Allergies  Propoxyphene hcl and Propoxyphene-acetaminophen  Home Medications   Current Outpatient Rx  Name Route Sig Dispense Refill  . ALPRAZOLAM 0.5 MG PO TABS Oral Take 1 tablet (0.5 mg total) by mouth 3 (three) times daily as needed for sleep. 90 tablet 3  . BISOPROLOL FUMARATE 5 MG PO TABS Oral Take 5 mg by mouth daily.     Marland Kitchen CLOTRIMAZOLE-BETAMETHASONE 1-0.05 % EX LOTN Topical Apply 1 application topically 2 (two) times daily as needed. For psoriasis    . DILTIAZEM HCL 30 MG PO TABS Oral Take 1 tablet (30 mg total) by mouth 3 (three) times daily. 90 tablet  11  . LIDOCAINE-PRILOCAINE 2.5-2.5 % EX CREA  Apply to port-a-cath site 1-2 hours prior to chemo.  Cover with plastic wrap 30 g 1  . ADULT MULTIVITAMIN W/MINERALS CH Oral Take 1 tablet by mouth daily.    Marland Kitchen OMEPRAZOLE 40 MG PO CPDR Oral Take 40 mg by mouth daily as needed. Heart burn    . PRESCRIPTION MEDICATION Intravenous Inject into the vein every 21 ( twenty-one) days. CHOP chemo.    Marland Kitchen PROCHLORPERAZINE MALEATE 10 MG PO TABS Oral Take 10 mg by mouth every 6 (six) hours as needed. For nausea.    Marland Kitchen SPIRIVA HANDIHALER 18 MCG IN CAPS  INHALE ONE DOSE EVERY DAY 30 each 6  . VITAMIN D (ERGOCALCIFEROL) 50000 UNITS PO CAPS Oral Take 50,000 Units by mouth every 7 (seven) days. Taken on  Sundays.    . WARFARIN SODIUM 5 MG PO TABS Oral Take 7.5 mg by mouth at bedtime.    . CEPHALEXIN 500 MG PO CAPS Oral Take 1 capsule (500 mg total) by mouth 4 (four) times daily. 30 capsule 0  . CIPROFLOXACIN HCL 500 MG PO TABS Oral Take 1 tablet (500 mg total) by mouth 2 (two) times daily. 14 tablet 0  . FLUCONAZOLE 200 MG PO TABS Oral Take 0.5 tablets (100 mg total) by mouth daily. 7 tablet 0    BP 147/114  Pulse 85  Temp 99.2 F (37.3 C) (Oral)  Resp 16  SpO2 95%  Physical Exam  Nursing note and vitals reviewed. Constitutional: She is oriented to person, place, and time. She appears well-developed and well-nourished. No distress.  HENT:  Head: Normocephalic and atraumatic.       White coating covering tongue, soft palate and post pharynx consistent with thrush.   Eyes: EOM are normal. Pupils are equal, round, and reactive to light.  Neck: Normal range of motion. Neck supple.  Cardiovascular: Normal rate and regular rhythm.   Pulmonary/Chest: Effort normal and breath sounds normal. No respiratory distress. She has no wheezes. She has no rales. She exhibits no tenderness.  Abdominal: Soft. Bowel sounds are normal. She exhibits no distension. There is no tenderness. There is no rebound and no guarding.  Musculoskeletal: Normal range of motion. She exhibits no edema and no tenderness.  Neurological: She is alert and oriented to person, place, and time.  Skin: Skin is warm and dry. No rash noted. No erythema.  Psychiatric: She has a normal mood and affect. Her behavior is normal.    ED Course  Procedures (including critical care time)  Labs Reviewed  CBC WITH DIFFERENTIAL - Abnormal; Notable for the following:    WBC 0.8 (*)     Platelets 76 (*)     Neutrophils Relative 0 (*)     Lymphocytes Relative 0 (*)     Monocytes Relative 0 (*)     Lymphs Abs 0.0 (*)     Monocytes Absolute 0.0 (*)     All other components within normal limits  COMPREHENSIVE METABOLIC PANEL - Abnormal;  Notable for the following:    Sodium 133 (*)     Glucose, Bld 112 (*)     BUN 29 (*)     ALT 46 (*)     GFR calc non Af Amer 46 (*)     GFR calc Af Amer 53 (*)     All other components within normal limits  URINALYSIS, ROUTINE W REFLEX MICROSCOPIC - Abnormal; Notable for the following:    Color, Urine  AMBER (*)  BIOCHEMICALS MAY BE AFFECTED BY COLOR   APPearance CLOUDY (*)     Hgb urine dipstick LARGE (*)     Protein, ur 30 (*)     Leukocytes, UA MODERATE (*)     All other components within normal limits  URINE MICROSCOPIC-ADD ON - Abnormal; Notable for the following:    Squamous Epithelial / LPF MANY (*)     Bacteria, UA MANY (*)     Casts HYALINE CASTS (*)     All other components within normal limits  POCT I-STAT TROPONIN I  PATHOLOGIST SMEAR REVIEW  URINE CULTURE   Dg Chest 2 View  03/21/2012  *RADIOLOGY REPORT*  Clinical Data: Short of breath.  Difficulty swallowing.  Chest soreness.  History of lymphoma and laryngeal cancer.  CHEST - 2 VIEW  Comparison: Chest radiography 12/22/2009.  PET scan 02/25/2012.  Findings: Power port is in place with its tip in the SVC 2 cm above the right atrium.  Heart size is normal.  Mediastinal shadows show unfolding of the aorta.  There is mild right hilar prominence compared to the left.  The lungs are clear except for mild bronchial thickening.  No effusions.  No significant bony findings.  IMPRESSION: Mild bronchial thickening. Mild prominence of the right hilar shadows compared to the left.  No other active process.   Original Report Authenticated By: Thomasenia Sales, M.D. ( 03/21/2012 09:01:14 )      1. Esophagitis   2. Leukopenia   3. Thrombocytopenia      Date: 03/21/2012  Rate:83  Rhythm: normal sinus rhythm  QRS Axis: normal  Intervals: normal  ST/T Wave abnormalities: nonspecific ST changes  Conduction Disutrbances:none  Narrative Interpretation:   Old EKG Reviewed: changes noted V4 T wave inversion since 12/23/09    MDM    Likely esophagitis from recent chemo. Will check labs, xray and discuss with pt oncologist.   Discussed with Dr Truett Perna. Advised diflucan, cont chloraseptic, starting cipro and will see in office in 1-2 days to repeat blood draw. Pt states she is felling much better. She has been advised to return immediately for worsening pain, fever, chills or any concerns  Do not believe breast pain represents CAD. Non-specific EKG findings with normal Trop. F/u with pmd or return for worsening symptoms      Loren Racer, MD 03/21/12 1007

## 2012-03-21 NOTE — Telephone Encounter (Signed)
S/w the pt's husband and he is aware of the appt on 03/22/2012@1 :15pm

## 2012-03-22 ENCOUNTER — Other Ambulatory Visit (HOSPITAL_BASED_OUTPATIENT_CLINIC_OR_DEPARTMENT_OTHER): Payer: Medicare Other | Admitting: Lab

## 2012-03-22 ENCOUNTER — Inpatient Hospital Stay (HOSPITAL_COMMUNITY)
Admission: AD | Admit: 2012-03-22 | Discharge: 2012-03-26 | DRG: 809 | Disposition: A | Discharge status: Home / Self Care | Payer: Medicare Other | Source: Ambulatory Visit | Attending: Oncology | Admitting: Oncology

## 2012-03-22 ENCOUNTER — Other Ambulatory Visit: Payer: Self-pay | Admitting: Oncology

## 2012-03-22 ENCOUNTER — Ambulatory Visit: Payer: Medicare Other | Admitting: Oncology

## 2012-03-22 ENCOUNTER — Encounter: Payer: Self-pay | Admitting: Oncology

## 2012-03-22 ENCOUNTER — Encounter (HOSPITAL_COMMUNITY): Payer: Self-pay

## 2012-03-22 VITALS — BP 116/75 | HR 103 | Temp 101.5°F | Resp 20 | Ht 64.5 in | Wt 192.4 lb

## 2012-03-22 DIAGNOSIS — T451X5A Adverse effect of antineoplastic and immunosuppressive drugs, initial encounter: Secondary | ICD-10-CM | POA: Diagnosis present

## 2012-03-22 DIAGNOSIS — C8510 Unspecified B-cell lymphoma, unspecified site: Secondary | ICD-10-CM

## 2012-03-22 DIAGNOSIS — B370 Candidal stomatitis: Secondary | ICD-10-CM | POA: Diagnosis present

## 2012-03-22 DIAGNOSIS — K121 Other forms of stomatitis: Secondary | ICD-10-CM

## 2012-03-22 DIAGNOSIS — M7501 Adhesive capsulitis of right shoulder: Secondary | ICD-10-CM

## 2012-03-22 DIAGNOSIS — I8310 Varicose veins of unspecified lower extremity with inflammation: Secondary | ICD-10-CM

## 2012-03-22 DIAGNOSIS — F329 Major depressive disorder, single episode, unspecified: Secondary | ICD-10-CM | POA: Diagnosis present

## 2012-03-22 DIAGNOSIS — K219 Gastro-esophageal reflux disease without esophagitis: Secondary | ICD-10-CM | POA: Diagnosis present

## 2012-03-22 DIAGNOSIS — C8589 Other specified types of non-Hodgkin lymphoma, extranodal and solid organ sites: Secondary | ICD-10-CM

## 2012-03-22 DIAGNOSIS — M1710 Unilateral primary osteoarthritis, unspecified knee: Secondary | ICD-10-CM

## 2012-03-22 DIAGNOSIS — R609 Edema, unspecified: Secondary | ICD-10-CM

## 2012-03-22 DIAGNOSIS — R5081 Fever presenting with conditions classified elsewhere: Secondary | ICD-10-CM | POA: Diagnosis present

## 2012-03-22 DIAGNOSIS — D709 Neutropenia, unspecified: Principal | ICD-10-CM | POA: Diagnosis present

## 2012-03-22 DIAGNOSIS — M19049 Primary osteoarthritis, unspecified hand: Secondary | ICD-10-CM

## 2012-03-22 DIAGNOSIS — L659 Nonscarring hair loss, unspecified: Secondary | ICD-10-CM

## 2012-03-22 DIAGNOSIS — M542 Cervicalgia: Secondary | ICD-10-CM

## 2012-03-22 DIAGNOSIS — E860 Dehydration: Secondary | ICD-10-CM | POA: Diagnosis present

## 2012-03-22 DIAGNOSIS — D649 Anemia, unspecified: Secondary | ICD-10-CM

## 2012-03-22 DIAGNOSIS — Z79899 Other long term (current) drug therapy: Secondary | ICD-10-CM

## 2012-03-22 DIAGNOSIS — I739 Peripheral vascular disease, unspecified: Secondary | ICD-10-CM

## 2012-03-22 DIAGNOSIS — Z800 Family history of malignant neoplasm of digestive organs: Secondary | ICD-10-CM

## 2012-03-22 DIAGNOSIS — R0602 Shortness of breath: Secondary | ICD-10-CM

## 2012-03-22 DIAGNOSIS — G4700 Insomnia, unspecified: Secondary | ICD-10-CM

## 2012-03-22 DIAGNOSIS — K279 Peptic ulcer, site unspecified, unspecified as acute or chronic, without hemorrhage or perforation: Secondary | ICD-10-CM

## 2012-03-22 DIAGNOSIS — I4891 Unspecified atrial fibrillation: Secondary | ICD-10-CM

## 2012-03-22 DIAGNOSIS — R1319 Other dysphagia: Secondary | ICD-10-CM

## 2012-03-22 DIAGNOSIS — K1230 Oral mucositis (ulcerative), unspecified: Secondary | ICD-10-CM

## 2012-03-22 DIAGNOSIS — Z9221 Personal history of antineoplastic chemotherapy: Secondary | ICD-10-CM

## 2012-03-22 DIAGNOSIS — I10 Essential (primary) hypertension: Secondary | ICD-10-CM

## 2012-03-22 DIAGNOSIS — Z7901 Long term (current) use of anticoagulants: Secondary | ICD-10-CM

## 2012-03-22 DIAGNOSIS — R197 Diarrhea, unspecified: Secondary | ICD-10-CM

## 2012-03-22 DIAGNOSIS — K625 Hemorrhage of anus and rectum: Secondary | ICD-10-CM

## 2012-03-22 DIAGNOSIS — K432 Incisional hernia without obstruction or gangrene: Secondary | ICD-10-CM

## 2012-03-22 DIAGNOSIS — Z8711 Personal history of peptic ulcer disease: Secondary | ICD-10-CM

## 2012-03-22 DIAGNOSIS — K5733 Diverticulitis of large intestine without perforation or abscess with bleeding: Secondary | ICD-10-CM

## 2012-03-22 DIAGNOSIS — R5381 Other malaise: Secondary | ICD-10-CM

## 2012-03-22 DIAGNOSIS — F419 Anxiety disorder, unspecified: Secondary | ICD-10-CM

## 2012-03-22 DIAGNOSIS — F3289 Other specified depressive episodes: Secondary | ICD-10-CM | POA: Diagnosis present

## 2012-03-22 DIAGNOSIS — Z8719 Personal history of other diseases of the digestive system: Secondary | ICD-10-CM

## 2012-03-22 DIAGNOSIS — E785 Hyperlipidemia, unspecified: Secondary | ICD-10-CM | POA: Diagnosis present

## 2012-03-22 DIAGNOSIS — R509 Fever, unspecified: Secondary | ICD-10-CM

## 2012-03-22 DIAGNOSIS — M1990 Unspecified osteoarthritis, unspecified site: Secondary | ICD-10-CM

## 2012-03-22 DIAGNOSIS — Z8673 Personal history of transient ischemic attack (TIA), and cerebral infarction without residual deficits: Secondary | ICD-10-CM

## 2012-03-22 DIAGNOSIS — J4489 Other specified chronic obstructive pulmonary disease: Secondary | ICD-10-CM | POA: Diagnosis present

## 2012-03-22 DIAGNOSIS — K632 Fistula of intestine: Secondary | ICD-10-CM

## 2012-03-22 DIAGNOSIS — J449 Chronic obstructive pulmonary disease, unspecified: Secondary | ICD-10-CM | POA: Diagnosis present

## 2012-03-22 DIAGNOSIS — Z96659 Presence of unspecified artificial knee joint: Secondary | ICD-10-CM

## 2012-03-22 DIAGNOSIS — R52 Pain, unspecified: Secondary | ICD-10-CM | POA: Diagnosis present

## 2012-03-22 DIAGNOSIS — F411 Generalized anxiety disorder: Secondary | ICD-10-CM | POA: Diagnosis present

## 2012-03-22 DIAGNOSIS — R079 Chest pain, unspecified: Secondary | ICD-10-CM

## 2012-03-22 LAB — PROTIME-INR
INR: 3.16 — ABNORMAL HIGH (ref 0.00–1.49)
Prothrombin Time: 32.9 seconds — ABNORMAL HIGH (ref 11.6–15.2)

## 2012-03-22 LAB — COMPREHENSIVE METABOLIC PANEL
AST: 22 U/L (ref 0–37)
Albumin: 3.5 g/dL (ref 3.5–5.2)
BUN: 19 mg/dL (ref 6–23)
Calcium: 8.8 mg/dL (ref 8.4–10.5)
Chloride: 99 mEq/L (ref 96–112)
Creatinine, Ser: 0.91 mg/dL (ref 0.50–1.10)
Total Bilirubin: 1.3 mg/dL — ABNORMAL HIGH (ref 0.3–1.2)

## 2012-03-22 LAB — CBC WITH DIFFERENTIAL/PLATELET
Basophils Absolute: 0 10*3/uL (ref 0.0–0.1)
EOS%: 11.1 % — ABNORMAL HIGH (ref 0.0–7.0)
HCT: 39.8 % (ref 34.8–46.6)
HGB: 13.1 g/dL (ref 11.6–15.9)
LYMPH%: 60 % — ABNORMAL HIGH (ref 14.0–49.7)
MCH: 27.8 pg (ref 25.1–34.0)
MCV: 84.3 fL (ref 79.5–101.0)
MONO%: 20 % — ABNORMAL HIGH (ref 0.0–14.0)
NEUT%: 4.5 % — ABNORMAL LOW (ref 38.4–76.8)
Platelets: 61 10*3/uL — ABNORMAL LOW (ref 145–400)
lymph#: 0.3 10*3/uL — ABNORMAL LOW (ref 0.9–3.3)

## 2012-03-22 MED ORDER — SODIUM CHLORIDE 0.9 % IV SOLN
INTRAVENOUS | Status: DC
  Administered 2012-03-22: 16:00:00 via INTRAVENOUS
  Administered 2012-03-23: 1000 mL via INTRAVENOUS
  Administered 2012-03-23 – 2012-03-25 (×5): via INTRAVENOUS

## 2012-03-22 MED ORDER — WARFARIN SODIUM 1 MG PO TABS
1.0000 mg | ORAL_TABLET | Freq: Once | ORAL | Status: AC
  Administered 2012-03-22: 1 mg via ORAL
  Filled 2012-03-22: qty 1

## 2012-03-22 MED ORDER — LEVALBUTEROL HCL 0.63 MG/3ML IN NEBU
0.6300 mg | INHALATION_SOLUTION | Freq: Four times a day (QID) | RESPIRATORY_TRACT | Status: DC | PRN
  Filled 2012-03-22: qty 3

## 2012-03-22 MED ORDER — MORPHINE SULFATE 2 MG/ML IJ SOLN: 2.0000 mg | INTRAMUSCULAR | Status: DC | PRN

## 2012-03-22 MED ORDER — VANCOMYCIN HCL 1000 MG IV SOLR
750.0000 mg | Freq: Two times a day (BID) | INTRAVENOUS | Status: DC
  Administered 2012-03-22 – 2012-03-24 (×4): 750 mg via INTRAVENOUS
  Filled 2012-03-22 (×5): qty 750

## 2012-03-22 MED ORDER — ALPRAZOLAM 0.5 MG PO TABS
0.5000 mg | ORAL_TABLET | Freq: Every evening | ORAL | Status: DC | PRN
  Administered 2012-03-22 – 2012-03-24 (×2): 0.5 mg via ORAL
  Filled 2012-03-22 (×2): qty 1

## 2012-03-22 MED ORDER — DEXTROSE 5 % IV SOLN
1.0000 g | Freq: Three times a day (TID) | INTRAVENOUS | Status: DC
  Filled 2012-03-22 (×2): qty 1

## 2012-03-22 MED ORDER — BISOPROLOL FUMARATE 5 MG PO TABS
5.0000 mg | ORAL_TABLET | Freq: Every day | ORAL | Status: DC
  Administered 2012-03-22 – 2012-03-25 (×4): 5 mg via ORAL
  Filled 2012-03-22 (×5): qty 1

## 2012-03-22 MED ORDER — CEFEPIME HCL 2 G IJ SOLR
2.0000 g | Freq: Two times a day (BID) | INTRAMUSCULAR | Status: DC
  Administered 2012-03-22 – 2012-03-23 (×3): 2 g via INTRAVENOUS
  Filled 2012-03-22 (×4): qty 2

## 2012-03-22 MED ORDER — WARFARIN SODIUM 7.5 MG PO TABS: 7.5000 mg | ORAL_TABLET | Freq: Every day | ORAL | Status: DC

## 2012-03-22 MED ORDER — DILTIAZEM HCL 30 MG PO TABS
30.0000 mg | ORAL_TABLET | Freq: Three times a day (TID) | ORAL | Status: DC
  Administered 2012-03-22 – 2012-03-25 (×11): 30 mg via ORAL
  Filled 2012-03-22 (×14): qty 1

## 2012-03-22 MED ORDER — FLUCONAZOLE 100 MG PO TABS
100.0000 mg | ORAL_TABLET | Freq: Every day | ORAL | Status: DC
  Administered 2012-03-23: 100 mg via ORAL
  Filled 2012-03-22: qty 1

## 2012-03-22 MED ORDER — ALUM & MAG HYDROXIDE-SIMETH 200-200-20 MG/5ML PO SUSP: 30.0000 mL | Freq: Four times a day (QID) | ORAL | Status: DC | PRN

## 2012-03-22 MED ORDER — ACETAMINOPHEN 160 MG/5ML PO SOLN
650.0000 mg | ORAL | Status: DC | PRN
  Administered 2012-03-22 – 2012-03-24 (×7): 650 mg via ORAL
  Filled 2012-03-22 (×8): qty 20.3

## 2012-03-22 MED ORDER — DOCUSATE SODIUM 100 MG PO CAPS
100.0000 mg | ORAL_CAPSULE | Freq: Two times a day (BID) | ORAL | Status: DC
  Administered 2012-03-23 – 2012-03-25 (×6): 100 mg via ORAL
  Filled 2012-03-22 (×9): qty 1

## 2012-03-22 MED ORDER — ACETAMINOPHEN 325 MG PO TABS: 650.0000 mg | ORAL_TABLET | Freq: Four times a day (QID) | ORAL | Status: DC | PRN

## 2012-03-22 MED ORDER — WARFARIN - PHARMACIST DOSING INPATIENT: Freq: Every day | Status: DC

## 2012-03-22 MED ORDER — ACETAMINOPHEN 80 MG/0.8ML PO SUSP: 650.0000 mg | ORAL | Status: DC | PRN

## 2012-03-22 MED ORDER — TIOTROPIUM BROMIDE MONOHYDRATE 18 MCG IN CAPS
18.0000 ug | ORAL_CAPSULE | Freq: Every day | RESPIRATORY_TRACT | Status: DC
  Administered 2012-03-23 – 2012-03-26 (×4): 18 ug via RESPIRATORY_TRACT
  Filled 2012-03-22: qty 5

## 2012-03-22 MED ORDER — VANCOMYCIN HCL IN DEXTROSE 1-5 GM/200ML-% IV SOLN: 1000.0000 mg | Freq: Once | INTRAVENOUS | Status: DC

## 2012-03-22 MED ORDER — PROCHLORPERAZINE MALEATE 5 MG PO TABS: 5.0000 mg | ORAL_TABLET | Freq: Four times a day (QID) | ORAL | Status: DC | PRN

## 2012-03-22 NOTE — H&P (Signed)
[name]  [phone] [phone]     ADMISSION HISTORY AND PHYSICAL   Reason for Admission: Febrile neutropenia, Non-Hodgkin's Lymphoma, and Mucositis.   HPI: [name] is an 78 year old female who is being directly admitted from our office to the oncology unit. [name] is seen today with both her husband and daughter in our office. She presents to our office with a fever of 101.5 and shaking chills. The patient received her first cycle of R-CHOP on 03/15/2012 and received her Neulasta injection on the day after her chemotherapy. The patient had been doing very well and in fact had increased energy following her chemotherapy. The patient started having more fatigue this past weekend. She developed a sore throat and a fever of 100.5 yesterday morning. She was seen in the emergency room for these symptoms and was diagnosed with thrush. She was also found to be neutropenic. She was given Diflucan and Cipro. The patient started feeling worse this morning around 9 AM with shaking chills. The patient's daughter states that she has had very little by mouth intake due to the sore throat. Patient denies chest pain and shortness of breath. The patient has a cough with small amount of clear sputum production. A chest x-ray was performed in the emergency room on 03/21/2012 and did not show any evidence of pneumonia. Denies abdominal pain, nausea, vomiting. Denies constipation and diarrhea. She has not noted any epistaxis, hemoptysis, hematemesis, hematuria, or melena. Denies dysuria.     Past Medical History  Diagnosis Date  . GERD (gastroesophageal reflux disease)   . Arthritis   . Stroke 10/2002    no residual  . History of TIAs   . Diverticulosis   . PUD (peptic ulcer disease)   . PVD (peripheral vascular disease)   . Hyperlipidemia   . Anxiety   . Depression   . 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  Blood transfusion   . COPD (chronic obstructive pulmonary disease)   . Lymphona, mantle cell, inguinal region/lower limb   :    Past Surgical History  Procedure Date  . Appendectomy   . Colon surgery 09/13/2008    iliostomy for diverticulitis  . Hernia repair   . Tubal ligation   . Throat surgery 1986  . Total knee arthroplasty     2002 left 2008 right  . Reversal of iliostomy 01/2010  . Cataract extraction 2005    bilateral  . Carotid surgery     2004 a month apart right and left  :   CURRENT MEDS: No current facility-administered medications for this encounter.   Current Outpatient Prescriptions  Medication Sig Dispense Refill  . ALPRAZolam (XANAX) 0.5 MG tablet Take 1 tablet (0.5 mg total) by mouth 3 (three) times daily as needed for sleep.  90 tablet  3  . bisoprolol (ZEBETA) 5 MG tablet Take 5 mg by mouth daily.       . cephALEXin (KEFLEX) 500 MG capsule Take 1 capsule (500 mg total) by mouth 4 (four) times daily.  30 capsule  0  . ciprofloxacin (CIPRO) 500 MG tablet Take 1 tablet (500 mg total) by mouth 2 (two) times daily.  14 tablet  0  . clotrimazole-betamethasone (LOTRISONE) lotion Apply 1 application topically 2 (two) times daily as needed. For psoriasis      . diltiazem (CARDIZEM) 30 MG tablet Take 1 tablet (30 mg total) by mouth 3 (  three) times daily.  90 tablet  11  . fluconazole (DIFLUCAN) 200 MG tablet Take 0.5 tablets (100 mg total) by mouth daily.  7 tablet  0  . lidocaine-prilocaine (EMLA) cream Apply to port-a-cath site 1-2 hours prior to chemo.  Cover with plastic wrap  30 g  1  . Multiple Vitamin (MULTIVITAMIN WITH MINERALS) TABS Take 1 tablet by mouth daily.      Marland Kitchen omeprazole (PRILOSEC) 40 MG capsule Take 40 mg by mouth daily as needed. Heart burn      . PRESCRIPTION MEDICATION Inject into the vein every 21 ( twenty-one) days. CHOP chemo.      . prochlorperazine (COMPAZINE) 10 MG tablet Take 10 mg by mouth every 6 (six) hours as needed. For nausea.        Marland Kitchen SPIRIVA HANDIHALER 18 MCG inhalation capsule INHALE ONE DOSE EVERY DAY  30 each  6  . Vitamin D, Ergocalciferol, (DRISDOL) 50000 UNITS CAPS Take 50,000 Units by mouth every 7 (seven) days. Taken on Sundays.      Marland Kitchen warfarin (COUMADIN) 5 MG tablet Take 7.5 mg by mouth at bedtime.          Allergies  Allergen Reactions  . Propoxyphene Hcl   . Propoxyphene-Acetaminophen     REACTION: Reaction not known  :  Family History  Problem Relation Age of Onset  . Colon cancer Sister     colon  . Hyperlipidemia Mother   . Stroke Mother   . Cancer Father     mesothelioma  :  History   Social History  . Marital Status: Married    Spouse Name: N/A    Number of Children: 6  . Years of Education: N/A   Occupational History  . retired    Social History Main Topics  . Smoking status: Former Games developer  . Smokeless tobacco: Never Used  . Alcohol Use: No  . Drug Use: No  . Sexually Active: Yes   Other Topics Concern  . Not on file   Social History Narrative  . No narrative on file  :  REVIEW OF SYSTEM:  The rest of the 14-point review of sytem was negative.   Exam: Vital signs: Temperature is 101.5, pulse 103, respirations 20, blood pressure 116/75. Weight is 192.4 pounds General:  IN a wheelchair today, looks chronically ill and has obvious shaking chills.  Eyes:  no scleral icterus.  ENT:  Thrush to her tongue with red posterior pharynx. No open areas noted. Neck was without thyromegaly.  Lymphatics:  Negative cervical, supraclavicular or axillary adenopathy.  Respiratory: Decreased breath sound to her left base. Expiratory wheezing noted in left base. Cardiovascular:  Regular rate and rhythm, S1/S2, without murmur, rub or gallop.  There was no pedal edema.  GI:  abdomen was soft, flat, nontender, nondistended, without organomegaly.  Muscoloskeletal:  no spinal tenderness of palpation of vertebral spine.  Skin exam was without echymosis, petichae.  Neuro exam was nonfocal.  Patient  was examined in her wheelchair. Patient was alerted and oriented.  Attention was good.   Language was appropriate.  Mood was normal without depression.  Speech was not pressured.  Thought content was not tangential.    LABS:  Lab Results  Component Value Date   WBC 0.5* 03/22/2012   HGB 13.1 03/22/2012   HCT 39.8 03/22/2012   PLT 61* 03/22/2012   GLUCOSE 112* 03/21/2012   CHOL 176 02/13/2010   TRIG 282.0* 02/13/2010   HDL 31.90* 02/13/2010  LDLDIRECT 100.9 02/13/2010   LDLCALC  Value: 81        Total Cholesterol/HDL:CHD Risk Coronary Heart Disease Risk Table                     Men   Women  1/2 Average Risk   3.4   3.3  Average Risk       5.0   4.4  2 X Average Risk   9.6   7.1  3 X Average Risk  23.4   11.0        Use the calculated Patient Ratio above and the CHD Risk Table to determine the patient's CHD Risk.        ATP III CLASSIFICATION (LDL):  <100     mg/dL   Optimal  161-096  mg/dL   Near or Above                    Optimal  130-159  mg/dL   Borderline  045-409  mg/dL   High  >811     mg/dL   Very High 04/16/7828   ALT 46* 03/21/2012   AST 34 03/21/2012   NA 133* 03/21/2012   K 3.9 03/21/2012   CL 98 03/21/2012   CREATININE 1.09 03/21/2012   BUN 29* 03/21/2012   CO2 27 03/21/2012   INR 0.97 03/09/2012   HGBA1C  Value: 5.9 (NOTE)                                                                       According to the ADA Clinical Practice Recommendations for 2011, when HbA1c is used as a screening test:   >=6.5%   Diagnostic of Diabetes Mellitus           (if abnormal result  is confirmed)  5.7-6.4%   Increased risk of developing Diabetes Mellitus  References:Diagnosis and Classification of Diabetes Mellitus,Diabetes Care,2011,34(Suppl 1):S62-S69 and Standards of Medical Care in         Diabetes - 2011,Diabetes Care,2011,34  (Suppl 1):S11-S61.* 12/23/2009    Dg Chest 2 View  03/21/2012  *RADIOLOGY REPORT*  Clinical Data: Short of breath.  Difficulty swallowing.  Chest soreness.  History of lymphoma and  laryngeal cancer.  CHEST - 2 VIEW  Comparison: Chest radiography 12/22/2009.  PET scan 02/25/2012.  Findings: Power port is in place with its tip in the SVC 2 cm above the right atrium.  Heart size is normal.  Mediastinal shadows show unfolding of the aorta.  There is mild right hilar prominence compared to the left.  The lungs are clear except for mild bronchial thickening.  No effusions.  No significant bony findings.  IMPRESSION: Mild bronchial thickening. Mild prominence of the right hilar shadows compared to the left.  No other active process.   Original Report Authenticated By: Thomasenia Sales, M.D. ( 03/21/2012 09:01:14 )      ASSESSMENT AND PLAN:   This is an 78 year old female who is being admitted to the hospital for the following issues:  1. Febrile neutropenia. The patient is status post one cycle of R-CHOP, 03/15/2012. Her total white count of 0.5 with an ANC of 0.0. She is febrile today. Plan is for urine culture, blood  cultures x2(one through her Port-A-Cath and 1 through peripheral site). A chest x-ray was performed on 03/21/2012 without any evidence of pneumonia. Will start her on broad-spectrum antibiotics. The patient has received Neulasta following her chemotherapy and her white count likely recover quickly. Will follow her CBC while she is in the hospital.  2. Oral candidiasis. Will continue oral Diflucan.  3. Non-Hodgkin's lymphoma, large B cell lymphoma, CD20 positive. Status post one cycle of R-CHOP, 03/15/2012. Will plan further chemotherapy as an outpatient once she has recovered from her febrile neutropenia.  4. Dehydration. She has poor oral intake due to with sore throat and will plan to give her IV fluids while she is inpatient.  5. Pain. Due to mucositis and thrush. We'll plan to continue Tylenol and add morphine sulfate as needed.  6. COPD. She is on Spiriva at home and will continue this while she is an inpatient.  7. Anticoagulation therapy-the PT/INR will be  monitored closely while she is on antibiotics   Plan for admission was reviewed with the patient, her husband, and daughter. They are in agreement with this plan. The patient was seen and examined with Dr. Truett Perna. Plan reviewed with Dr. Alisia Ferrari.   [name], ANP/GNP-BC  I saw and examined the patient. She will be admitted for evaluation and management of febrile neutropenia at day 8 following a first cycle of R.-CHOP given for treatment of non-Hodgkin's lymphoma. Cultures will be obtained and she will be placed on broad-spectrum intravenous antibiotics.

## 2012-03-22 NOTE — Progress Notes (Signed)
ANTIBIOTIC/ANTICOAGULANT CONSULT NOTE - INITIAL  Pharmacy Consult for Vancomycin Indication: Febrile Neutropenia  Pharmacy Consult for Coumadin Indication: Afib  Allergies  Allergen Reactions  . Propoxyphene Hcl   . Propoxyphene-Acetaminophen     REACTION: Reaction not known    Patient Measurements: Height: 5' 4.5" (163.8 cm) Weight: 192 lb (87.091 kg) IBW/kg (Calculated) : 55.85   Vital Signs: Temp: 103.1 F (39.5 C) (08/20 1452) Temp src: Oral (08/20 1452) BP: 175/89 mmHg (08/20 1452) Pulse Rate: 109  (08/20 1452) Intake/Output from previous day:   Intake/Output from this shift:    Labs:  Basename 03/22/12 1321 03/21/12 0830  WBC 0.5* 0.8*  HGB 13.1 12.6  PLT 61* 76*  LABCREA -- --  CREATININE -- 1.09   Estimated Creatinine Clearance: 43 ml/min (by C-G formula based on Cr of 1.09). 45 ml/mim/1.23m2 (normalized)   INR = 3.16  Microbiology: No results found for this or any previous visit (from the past 720 hour(s)).  Medical History: Past Medical History  Diagnosis Date  . GERD (gastroesophageal reflux disease)   . Arthritis   . Stroke 10/2002    no residual  . History of TIAs   . Diverticulosis   . PUD (peptic ulcer disease)   . PVD (peripheral vascular disease)   . Hyperlipidemia   . Anxiety   . Depression   . 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 Blood transfusion   . COPD (chronic obstructive pulmonary disease)   . Lymphona, mantle cell, inguinal region/lower limb     Medications:  Scheduled:    . bisoprolol  5 mg Oral Daily  . ceFEPime (MAXIPIME) IV  1 g Intravenous Q8H  . diltiazem  30 mg Oral TID  . docusate sodium  100 mg Oral BID  . fluconazole  100 mg Oral Daily  . tiotropium  18 mcg Inhalation Daily  . vancomycin  750 mg Intravenous Q12H  . warfarin  7.5 mg Oral QHS  . DISCONTD: vancomycin  1,000 mg Intravenous Once   Infusions:    . sodium chloride     Assessment: [name]  with NHL, s/p R-CHOP on 03/15/2012 directly admitted from [facility] with febrile neutropenia, mucositis.  Goal of Therapy:  Vancomycin trough level 15-20 mcg/ml INR = 2-3  Plan:  Vancomycin  Begin 750mg  IV q12h  Check trough at steady state  Change cefepime to 2gm IV q12h Follow up renal function & cultures  Coumadin  Decrease coumadin to 1mg  today  Daily PT/INR  Monitor for drug interaction with fluconazole  Loralee Pacas, PharmD, BCPS Pager: [phone] 03/22/2012,3:00 PM

## 2012-03-22 NOTE — Progress Notes (Signed)
Patient admitted to the hospital for febrile neutropenia, NHL, and mucositis. See admission H&P.

## 2012-03-23 DIAGNOSIS — D61818 Other pancytopenia: Secondary | ICD-10-CM

## 2012-03-23 LAB — PROTIME-INR: INR: 2.9 — ABNORMAL HIGH (ref 0.00–1.49)

## 2012-03-23 LAB — CBC WITH DIFFERENTIAL/PLATELET
Basophils Relative: 0 % (ref 0–1)
Eosinophils Relative: 0 % (ref 0–5)
Hemoglobin: 11.3 g/dL — ABNORMAL LOW (ref 12.0–15.0)
Lymphs Abs: 0 10*3/uL — ABNORMAL LOW (ref 0.7–4.0)
MCH: 27.8 pg (ref 26.0–34.0)
MCHC: 33.2 g/dL (ref 30.0–36.0)
MCV: 83.7 fL (ref 78.0–100.0)
RBC: 4.06 MIL/uL (ref 3.87–5.11)
nRBC: 0 /100 WBC

## 2012-03-23 MED ORDER — HYDROCODONE-ACETAMINOPHEN 5-325 MG PO TABS
1.0000 | ORAL_TABLET | ORAL | Status: DC | PRN
  Administered 2012-03-24: 1 via ORAL
  Filled 2012-03-23: qty 1

## 2012-03-23 MED ORDER — DEXTROSE 5 % IV SOLN
2.0000 g | Freq: Three times a day (TID) | INTRAVENOUS | Status: DC
  Administered 2012-03-23 – 2012-03-26 (×9): 2 g via INTRAVENOUS
  Filled 2012-03-23 (×10): qty 2

## 2012-03-23 MED ORDER — FLUCONAZOLE 100 MG PO TABS
100.0000 mg | ORAL_TABLET | Freq: Once | ORAL | Status: AC
  Administered 2012-03-23: 100 mg via ORAL
  Filled 2012-03-23: qty 1

## 2012-03-23 MED ORDER — WARFARIN SODIUM 3 MG PO TABS
3.0000 mg | ORAL_TABLET | Freq: Once | ORAL | Status: AC
  Administered 2012-03-23: 3 mg via ORAL
  Filled 2012-03-23: qty 1

## 2012-03-23 MED ORDER — FLUCONAZOLE 200 MG PO TABS
200.0000 mg | ORAL_TABLET | Freq: Every day | ORAL | Status: DC
  Administered 2012-03-24: 200 mg via ORAL
  Filled 2012-03-23 (×2): qty 1

## 2012-03-23 NOTE — Progress Notes (Signed)
MD, Patient has spiked several temperatures throughout the night (highest temp was 102 oral).  Patient has been given tylenol about every 4 hours, which has been effective each time.  No other complaints from patient.  Will continue to monitor.  [name]

## 2012-03-23 NOTE — Progress Notes (Signed)
   [facility]    OFFICE PROGRESS NOTE   INTERVAL HISTORY:   She feels better this morning. The fever is down. No sore throat.  Objective:  Vital signs in last 24 hours:  Blood pressure 110/68, pulse 84, temperature 99.5 F (37.5 C), temperature source Oral, resp. rate 20, height 5' 4.5" (1.638 m), weight 192 lb (87.091 kg), SpO2 94.00%.    HEENT: No thrush or ulcers Resp: Good air movement bilaterally, scattered and inspiratory bronchial sounds, no respiratory distress Cardio: Regular rate and rhythm GI: Nontender, soft Vascular: No leg edema Neuro: Alert and oriented    Portacath/PICC-without erythema  Lab Results:  Lab Results  Component Value Date   WBC 0.5* 03/23/2012   HGB 11.3* 03/23/2012   HCT 34.0* 03/23/2012   MCV 83.7 03/23/2012   PLT 46* 03/23/2012   PT/INR 2.9   Medications: I have reviewed the patient's current medications.  Assessment/Plan: 1. Febrile neutropenia. The fever is down today. Continue vancomycin/cefepime and followup on the blood and urine cultures. No apparent source of infection. 2. Oral candidiasis. Will continue oral Diflucan.  3. Non-Hodgkin's lymphoma, large B cell lymphoma, CD20 positive. Status post one cycle of R-CHOP, 03/15/2012. Will plan further chemotherapy as an outpatient once she has recovered from her febrile neutropenia.  4. Dehydration. She has poor oral intake due to with sore throat , continue IV fluid 5. Pain. Due to mucositis and thrush, improved. Vicodin or morphine sulfate as needed.  6. COPD. Spiriva/nebulizers as needed 7. Anticoagulation therapy-the PT/INR will be monitored closely while she is on antibiotics , therapeutic today 8. Pancytopenia secondary to chemotherapy-she received Neulasta on 03/16/2012. Hopefully the white count will recover over the next few days. We continue to monitor the platelet count daily.   Disposition:  Her clinical status appears improved. The plan is to continue  broad-spectrum intravenous antibiotics. We will followup on the blood and urine culture from hospital admission.   Thornton Papas, MD  03/23/2012  10:45 AM

## 2012-03-23 NOTE — Progress Notes (Signed)
   CARE MANAGEMENT NOTE 03/23/2012  Patient:  AUBRI, GATHRIGHT   Account Number:  [id number]  Date Initiated:  03/23/2012  Documentation initiated by:  PEARSON,COOKIE  Subjective/Objective Assessment:   Pt admitted with cco fever, febrile neutropenia, Non Hodgkin's Lymphoma, and Mucositis     Action/Plan:   Plan to dc home   Anticipated DC Date:  03/25/2012   Anticipated DC Plan:  HOME/SELF CARE      DC Planning Services  CM consult      Choice offered to / List presented to:             Status of service:  In process, will continue to follow Medicare Important Message given?  NA - LOS <3 / Initial given by admissions (If response is "NO", the following Medicare IM given date fields will be blank) Date Medicare IM given:   Date Additional Medicare IM given:    Discharge Disposition:    Per UR Regulation:  Reviewed for med. necessity/level of care/duration of stay  If discussed at Long Length of Stay Meetings, dates discussed:    Comments:  8.21/13 MPearson, RN, BSN Chart reviewed. Plan to return home.

## 2012-03-23 NOTE — Plan of Care (Signed)
Problem: Phase I Progression Outcomes Goal: Mouth/oropharynx addressed Outcome: Progressing Mouth car done

## 2012-03-23 NOTE — Progress Notes (Signed)
Pt. Complained of a headache, she was medicated with tylenol and fell asleep.Pt. Ran a temperature 101.9 Dr. Donnie Coffin covering for Dr. Truett Perna. He ordered an increase in her diflucan from 100 mg to 200 mg q day. Fever came down to 100.9 after tylenol was given.

## 2012-03-23 NOTE — Plan of Care (Signed)
Problem: Phase I Progression Outcomes Goal: OOB as tolerated unless otherwise ordered Outcome: Progressing Pt. Refused to get out of bed. Slept most the day

## 2012-03-23 NOTE — Progress Notes (Signed)
ANTICOAGULANT CONSULT NOTE - Follow-up  Pharmacy Consult for Coumadin Indication: Afib  Allergies  Allergen Reactions  . Propoxyphene Hcl   . Propoxyphene-Acetaminophen     REACTION: Reaction not known    Patient Measurements: Height: 5' 4.5" (163.8 cm) Weight: 192 lb (87.091 kg) IBW/kg (Calculated) : 55.85   Vital Signs: Temp: 99.5 F (37.5 C) (08/21 0520) Temp src: Oral (08/21 0520) BP: 110/68 mmHg (08/21 0520) Pulse Rate: 84  (08/21 0520) Intake/Output from previous day: 08/20 0701 - 08/21 0700 In: 625 [I.V.:625] Out: -  Intake/Output from this shift:    Labs:  Basename 03/23/12 0340 03/22/12 1610 03/22/12 1321 03/21/12 0830  WBC 0.5* -- 0.5* 0.8*  HGB 11.3* -- 13.1 12.6  PLT 46* -- 61* 76*  LABCREA -- -- -- --  CREATININE -- 0.91 -- 1.09   Estimated Creatinine Clearance: 51.5 ml/min (by C-G formula based on Cr of 0.91). 45 ml/mim/1.82m2 (normalized)   INR = 2.9  Microbiology: No results found for this or any previous visit (from the past 720 hour(s)).  Medical History: Past Medical History  Diagnosis Date  . GERD (gastroesophageal reflux disease)   . Arthritis   . Stroke 10/2002    no residual  . History of TIAs   . Diverticulosis   . PUD (peptic ulcer disease)   . PVD (peripheral vascular disease)   . Hyperlipidemia   . Anxiety   . Depression   . 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 Blood transfusion   . COPD (chronic obstructive pulmonary disease)   . Lymphona, mantle cell, inguinal region/lower limb     Medications:  Scheduled:     . bisoprolol  5 mg Oral Daily  . ceFEPime (MAXIPIME) IV  2 g Intravenous Q12H  . diltiazem  30 mg Oral TID  . docusate sodium  100 mg Oral BID  . fluconazole  100 mg Oral Daily  . tiotropium  18 mcg Inhalation Daily  . vancomycin  750 mg Intravenous Q12H  . warfarin  1 mg Oral ONCE-1800  . Warfarin - Pharmacist Dosing Inpatient   Does not apply q1800  .  DISCONTD: ceFEPime (MAXIPIME) IV  1 g Intravenous Q8H  . DISCONTD: vancomycin  1,000 mg Intravenous Once  . DISCONTD: warfarin  7.5 mg Oral QHS   Infusions:     . sodium chloride 100 mL/hr at 03/23/12 0422   Assessment: 84 YOF with NHL, s/p R-CHOP on 03/15/2012 directly admitted from [facility] with febrile neutropenia, mucositis. Patient on chronic warfarin PTA for a.fib.   Home dose of warfarin PTA was 7.5 mg daily  INR currently therapeutic 2.9 today (8/21), decreased from 3.16 on 8/20  Potential drug interaction with fluconazole (and patient previously on cipro outpt), may increase INR  Goal of Therapy:  INR = 2-3  Plan:   Coumadin 3 mg x 1 today  Daily PT/INR  Monitor for drug interaction with fluconazole  Tammy Sours, Pharm.D. Clinical Oncology Pharmacist  Pager # [phone]  03/23/2012,8:41 AM

## 2012-03-24 ENCOUNTER — Telehealth: Payer: Self-pay | Admitting: *Deleted

## 2012-03-24 LAB — CBC WITH DIFFERENTIAL/PLATELET
Basophils Absolute: 0 10*3/uL (ref 0.0–0.1)
Eosinophils Relative: 3 % (ref 0–5)
HCT: 31.4 % — ABNORMAL LOW (ref 36.0–46.0)
Lymphs Abs: 0.2 10*3/uL — ABNORMAL LOW (ref 0.7–4.0)
MCV: 84.2 fL (ref 78.0–100.0)
Monocytes Relative: 3 % (ref 3–12)
Neutro Abs: 1.3 10*3/uL — ABNORMAL LOW (ref 1.7–7.7)
RDW: 14.7 % (ref 11.5–15.5)
WBC: 1.5 10*3/uL — ABNORMAL LOW (ref 4.0–10.5)

## 2012-03-24 LAB — URINE CULTURE

## 2012-03-24 LAB — VANCOMYCIN, TROUGH: Vancomycin Tr: 9.4 ug/mL — ABNORMAL LOW (ref 10.0–20.0)

## 2012-03-24 MED ORDER — VANCOMYCIN HCL 1000 MG IV SOLR
1250.0000 mg | Freq: Two times a day (BID) | INTRAVENOUS | Status: DC
  Administered 2012-03-24 – 2012-03-26 (×4): 1250 mg via INTRAVENOUS
  Filled 2012-03-24 (×5): qty 1250

## 2012-03-24 MED ORDER — MAGIC MOUTHWASH
5.0000 mL | ORAL | Status: DC | PRN
  Administered 2012-03-24 – 2012-03-26 (×3): 5 mL via ORAL
  Filled 2012-03-24 (×2): qty 10

## 2012-03-24 NOTE — Progress Notes (Signed)
ANTIBIOTIC CONSULT NOTE - Follow up  Pharmacy Consult for Vancomycin Indication: Febrile Neutropenia  Pharmacy Consult for Coumadin Indication: Afib  Allergies  Allergen Reactions  . Propoxyphene Hcl   . Propoxyphene-Acetaminophen     REACTION: Reaction not known    Patient Measurements: Height: 5' 4.5" (163.8 cm) Weight: 192 lb (87.091 kg) IBW/kg (Calculated) : 55.85   Vital Signs: Temp: 98.4 F (36.9 C) (08/22 0508) Temp src: Oral (08/22 0508) BP: 121/73 mmHg (08/22 0508) Pulse Rate: 83  (08/22 0508) Intake/Output from previous day: 08/21 0701 - 08/22 0700 In: 4251 [P.O.:960; I.V.:3091; IV Piggyback:200] Out: 1141 [Urine:1140; Stool:1] Intake/Output from this shift:    Labs:  Basename 03/24/12 0417 03/23/12 0340 03/22/12 1610 03/22/12 1321  WBC 1.5* 0.5* -- 0.5*  HGB 10.3* 11.3* -- 13.1  PLT 58* 46* -- 61*  LABCREA -- -- -- --  CREATININE -- -- 0.91 --   Estimated Creatinine Clearance: 51.5 ml/min (by C-G formula based on Cr of 0.91). 45 ml/mim/1.16m2 (normalized)   Microbiology: Recent Results (from the past 720 hour(s))  CULTURE, BLOOD (ROUTINE X 2)     Status: Normal (Preliminary result)   Collection Time   03/22/12  3:38 PM      Component Value Range Status Comment   Specimen Description BLOOD RIGHT ARM   Final    Special Requests BOTTLES DRAWN AEROBIC AND ANAEROBIC 10CC   Final    Culture  Setup Time 03/23/2012 01:15   Final    Culture     Final    Value:        BLOOD CULTURE RECEIVED NO GROWTH TO DATE CULTURE WILL BE HELD FOR 5 DAYS BEFORE ISSUING A FINAL NEGATIVE REPORT   Report Status PENDING   Incomplete   CULTURE, BLOOD (ROUTINE X 2)     Status: Normal (Preliminary result)   Collection Time   03/22/12  4:00 PM      Component Value Range Status Comment   Specimen Description BLOOD RIGHT HAND   Final    Special Requests BOTTLES DRAWN AEROBIC AND ANAEROBIC 10CC   Final    Culture  Setup Time 03/23/2012 01:16   Final    Culture     Final    Value:        BLOOD CULTURE RECEIVED NO GROWTH TO DATE CULTURE WILL BE HELD FOR 5 DAYS BEFORE ISSUING A FINAL NEGATIVE REPORT   Report Status PENDING   Incomplete     Medical History: Past Medical History  Diagnosis Date  . GERD (gastroesophageal reflux disease)   . Arthritis   . Stroke 10/2002    no residual  . History of TIAs   . Diverticulosis   . PUD (peptic ulcer disease)   . PVD (peripheral vascular disease)   . Hyperlipidemia   . Anxiety   . Depression   . 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 Blood transfusion   . COPD (chronic obstructive pulmonary disease)   . Lymphona, mantle cell, inguinal region/lower limb     Medications:  Scheduled:     . bisoprolol  5 mg Oral Daily  . ceFEPime (MAXIPIME) IV  2 g Intravenous Q8H  . diltiazem  Lisa mg Oral TID  . docusate sodium  100 mg Oral BID  . fluconazole  100 mg Oral Once  . fluconazole  200 mg Oral Daily  . tiotropium  18 mcg Inhalation Daily  . vancomycin  1,250 mg  Intravenous Q12H  . warfarin  3 mg Oral ONCE-1800  . Warfarin - Pharmacist Dosing Inpatient   Does not apply q1800  . DISCONTD: ceFEPime (MAXIPIME) IV  2 g Intravenous Q12H  . DISCONTD: fluconazole  100 mg Oral Daily  . DISCONTD: vancomycin  750 mg Intravenous Q12H   Infusions:     . sodium chloride 100 mL/hr at 03/24/12 4782   Assessment: [name] with NHL, s/p R-CHOP on 03/15/2012 directly admitted from [facility] with febrile neutropenia, mucositis. Neutropenia is improving with WBC increasing to 1.5 and ANC 1.3. Tmax in last 24 hrs: 101.9. Continues on Vancomycin, cefepime, and fluconazole. Vanc trough this am low @ 9.4. Will increase. No growth to date from cultures (pending)  Goal of Therapy:  Vancomycin trough level 15-20 mcg/ml INR = 2-3  Plan:  Vancomycin  Increase to 1250 IV q12h  Check trough at steady state  Continue cefepime @ 2gm IV q12h Follow up renal function &  cultures    [name], PharmD, Pager: [phone] 03/24/2012,8:53 AM

## 2012-03-24 NOTE — Telephone Encounter (Signed)
Received call from pt husband stating that "my wife is in the hospital and I want to speak with Dr. Truett Perna."  Dr. Truett Perna made aware and stated he would round on pt this afternoon.

## 2012-03-24 NOTE — Progress Notes (Signed)
CRITICAL VALUE ALERT  Critical value received:  INR 5.26  Date of notification:  03/24/12  Time of notification:  0530  Critical value read back:Yes  Nurse who received alert:  Kenton Kingfisher, RN  MD notified (1st page):  0530  Time of first page:  0530  MD notified (2nd page):N/A  Time of second page:N/A  Responding MD:  Donnie Coffin  Time MD responded:  [phone]  Patient at time of page did not have any active bleeding.  MD made aware of critical and is aware that pharmacy is dosing coumadin.

## 2012-03-24 NOTE — Progress Notes (Signed)
   [facility]    OFFICE PROGRESS NOTE   INTERVAL HISTORY:   She continues to feel better. The tongue is sore. No sore throat.  Objective:  Vital signs in last 24 hours:  Blood pressure 121/73, pulse 83, temperature 98.4 F (36.9 C), temperature source Oral, resp. rate 16, height 5' 4.5" (1.638 m), weight 192 lb (87.091 kg), SpO2 92.00%.    HEENT: No thrush or ulcers Resp: Good air movement bilaterally no respiratory distress Cardio: Regular rate and rhythm GI: Nontender, soft Vascular: No leg edema Neuro: Alert and oriented    Portacath/PICC-without erythema  Lab Results:  Lab Results  Component Value Date   WBC 1.5* 03/24/2012   HGB 10.3* 03/24/2012   HCT 31.4* 03/24/2012   MCV 84.2 03/24/2012   PLT 58* 03/24/2012   ANC 1.3  PT/INR 5.26  Medications: I have reviewed the patient's current medications.  Assessment/Plan: 1. Febrile neutropenia. The fever is down today. Continue vancomycin/cefepime and followup on the blood and urine cultures. No apparent source of infection. Cultures negative to date. 2. Oral candidiasis-resolved, discontinue Diflucan 3. Non-Hodgkin's lymphoma, large B cell lymphoma, CD20 positive. Status post one cycle of R-CHOP, 03/15/2012. Will plan further chemotherapy as an outpatient once she has recovered from her febrile neutropenia.  4. Dehydration. Continue intravenous fluids, increase diet as tolerated 5. Pain. Due to mucositis and thrush, improved. Vicodin as needed, try Magic mouthwash  6. COPD. Spiriva/nebulizers as needed 7. Anticoagulation therapy-the PT/INR is supratherapeutic today. Coumadin will be placed on hold. 8. Pancytopenia secondary to chemotherapy-she received Neulasta on 03/16/2012. The white count is recovering in the platelets are higher today.  Disposition:  She appears much improved. The fever is down and the white count is recovering. The plan is to continue vancomycin/cefepime and followup on the  blood/urine cultures. We will consider discharge to home on oral antibiotics if she remains afebrile over the next one to 2 days.  Thornton Papas, MD  03/24/2012  12:37 PM

## 2012-03-24 NOTE — Progress Notes (Signed)
ANTICOAGULANT CONSULT NOTE - Follow-up  Pharmacy Consult for Coumadin Indication: Afib  Allergies  Allergen Reactions  . Propoxyphene Hcl   . Propoxyphene-Acetaminophen     REACTION: Reaction not known    Patient Measurements: Height: 5' 4.5" (163.8 cm) Weight: 192 lb (87.091 kg) IBW/kg (Calculated) : 55.85   Vital Signs: Temp: 98.4 F (36.9 C) (08/22 0508) Temp src: Oral (08/22 0508) BP: 121/73 mmHg (08/22 0508) Pulse Rate: 83  (08/22 0508) Intake/Output from previous day: 08/21 0701 - 08/22 0700 In: 4251 [P.O.:960; I.V.:3091; IV Piggyback:200] Out: 1141 [Urine:1140; Stool:1] Intake/Output from this shift:    Labs:  Basename 03/24/12 0417 03/23/12 0340 03/22/12 1610 03/22/12 1321  WBC 1.5* 0.5* -- 0.5*  HGB 10.3* 11.3* -- 13.1  PLT 58* 46* -- 61*  LABCREA -- -- -- --  CREATININE -- -- 0.91 --   Estimated Creatinine Clearance: 51.5 ml/min (by C-G formula based on Cr of 0.91). 45 ml/mim/1.63m2 (normalized)   INR = 5.26  Microbiology: Recent Results (from the past 720 hour(s))  CULTURE, BLOOD (ROUTINE X 2)     Status: Normal (Preliminary result)   Collection Time   03/22/12  3:38 PM      Component Value Range Status Comment   Specimen Description BLOOD RIGHT ARM   Final    Special Requests BOTTLES DRAWN AEROBIC AND ANAEROBIC 10CC   Final    Culture  Setup Time 03/23/2012 01:15   Final    Culture     Final    Value:        BLOOD CULTURE RECEIVED NO GROWTH TO DATE CULTURE WILL BE HELD FOR 5 DAYS BEFORE ISSUING A FINAL NEGATIVE REPORT   Report Status PENDING   Incomplete   CULTURE, BLOOD (ROUTINE X 2)     Status: Normal (Preliminary result)   Collection Time   03/22/12  4:00 PM      Component Value Range Status Comment   Specimen Description BLOOD RIGHT HAND   Final    Special Requests BOTTLES DRAWN AEROBIC AND ANAEROBIC 10CC   Final    Culture  Setup Time 03/23/2012 01:16   Final    Culture     Final    Value:        BLOOD CULTURE RECEIVED NO GROWTH TO DATE  CULTURE WILL BE HELD FOR 5 DAYS BEFORE ISSUING A FINAL NEGATIVE REPORT   Report Status PENDING   Incomplete     Medical History: Past Medical History  Diagnosis Date  . GERD (gastroesophageal reflux disease)   . Arthritis   . Stroke 10/2002    no residual  . History of TIAs   . Diverticulosis   . PUD (peptic ulcer disease)   . PVD (peripheral vascular disease)   . Hyperlipidemia   . Anxiety   . Depression   . 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 Blood transfusion   . COPD (chronic obstructive pulmonary disease)   . Lymphona, mantle cell, inguinal region/lower limb     Medications:  Scheduled:     . bisoprolol  5 mg Oral Daily  . ceFEPime (MAXIPIME) IV  2 g Intravenous Q8H  . diltiazem  30 mg Oral TID  . docusate sodium  100 mg Oral BID  . fluconazole  100 mg Oral Once  . fluconazole  200 mg Oral Daily  . tiotropium  18 mcg Inhalation Daily  . vancomycin  750 mg Intravenous Q12H  .  warfarin  3 mg Oral ONCE-1800  . Warfarin - Pharmacist Dosing Inpatient   Does not apply q1800  . DISCONTD: ceFEPime (MAXIPIME) IV  2 g Intravenous Q12H  . DISCONTD: fluconazole  100 mg Oral Daily   Infusions:     . sodium chloride 100 mL/hr at 03/24/12 2956   Assessment: 45 YOF with NHL, s/p R-CHOP on 03/15/2012 directly admitted from [facility] with febrile neutropenia, mucositis. Patient on chronic warfarin PTA for a.fib.   Home dose of warfarin PTA was 7.5 mg daily  INR drastic increase this AM to 5.26 from 2.9  Likely 2/2 drug interaction with fluconazole (and patient previously on cipro outpt)  No bleeding noted per RN  Goal of Therapy:  INR = 2-3  Plan:   Hold Coumadin Today  Daily PT/INR  Monitor for bleeding  [name], Pharm.D. Pager # [phone]                                                                     03/24/2012,8:44 AM

## 2012-03-25 DIAGNOSIS — J449 Chronic obstructive pulmonary disease, unspecified: Secondary | ICD-10-CM

## 2012-03-25 LAB — CBC WITH DIFFERENTIAL/PLATELET
Basophils Relative: 0 % (ref 0–1)
Eosinophils Relative: 1 % (ref 0–5)
Hemoglobin: 10.3 g/dL — ABNORMAL LOW (ref 12.0–15.0)
Lymphocytes Relative: 5 % — ABNORMAL LOW (ref 12–46)
Monocytes Relative: 7 % (ref 3–12)
Neutrophils Relative %: 87 % — ABNORMAL HIGH (ref 43–77)
RBC: 3.75 MIL/uL — ABNORMAL LOW (ref 3.87–5.11)

## 2012-03-25 LAB — PROTIME-INR
INR: 3.08 — ABNORMAL HIGH (ref 0.00–1.49)
Prothrombin Time: 32.3 seconds — ABNORMAL HIGH (ref 11.6–15.2)

## 2012-03-25 MED ORDER — ZOLPIDEM TARTRATE 5 MG PO TABS
5.0000 mg | ORAL_TABLET | Freq: Every evening | ORAL | Status: DC | PRN
Start: 2012-03-25 — End: 2012-03-26
  Administered 2012-03-25: 5 mg via ORAL
  Filled 2012-03-25: qty 1

## 2012-03-25 MED ORDER — WARFARIN SODIUM 1 MG PO TABS
1.0000 mg | ORAL_TABLET | Freq: Once | ORAL | Status: AC
  Administered 2012-03-25: 1 mg via ORAL
  Filled 2012-03-25: qty 1

## 2012-03-25 NOTE — Progress Notes (Signed)
ANTICOAGULANT CONSULT NOTE - Follow-up  Pharmacy Consult for Coumadin Indication: Afib  Allergies  Allergen Reactions  . Propoxyphene Hcl   . Propoxyphene-Acetaminophen     REACTION: Reaction not known    Patient Measurements: Height: 5' 4.5" (163.8 cm) Weight: 192 lb (87.091 kg) IBW/kg (Calculated) : 55.85   Vital Signs: Temp: 99.4 F (37.4 C) (08/23 0651) Temp src: Oral (08/23 0651) BP: 154/81 mmHg (08/23 0651) Pulse Rate: 96  (08/23 0651) Intake/Output from previous day: 08/22 0701 - 08/23 0700 In: -  Out: 1325 [Urine:1325] Intake/Output from this shift: Total I/O In: -  Out: 300 [Urine:300]  Labs:  Basename 03/25/12 0530 03/24/12 0417 03/23/12 0340 03/22/12 1610  WBC 4.5 1.5* 0.5* --  HGB 10.3* 10.3* 11.3* --  PLT PENDING 58* 46* --  LABCREA -- -- -- --  CREATININE -- -- -- 0.91   Estimated Creatinine Clearance: 51.5 ml/min (by C-G formula based on Cr of 0.91). 45 ml/mim/1.41m2 (normalized)   INR = 3.08  Microbiology: Recent Results (from the past 720 hour(s))  CULTURE, BLOOD (ROUTINE X 2)     Status: Normal (Preliminary result)   Collection Time   03/22/12  3:38 PM      Component Value Range Status Comment   Specimen Description BLOOD RIGHT ARM   Final    Special Requests BOTTLES DRAWN AEROBIC AND ANAEROBIC 10CC   Final    Culture  Setup Time 03/23/2012 01:15   Final    Culture     Final    Value:        BLOOD CULTURE RECEIVED NO GROWTH TO DATE CULTURE WILL BE HELD FOR 5 DAYS BEFORE ISSUING A FINAL NEGATIVE REPORT   Report Status PENDING   Incomplete   CULTURE, BLOOD (ROUTINE X 2)     Status: Normal (Preliminary result)   Collection Time   03/22/12  4:00 PM      Component Value Range Status Comment   Specimen Description BLOOD RIGHT HAND   Final    Special Requests BOTTLES DRAWN AEROBIC AND ANAEROBIC 10CC   Final    Culture  Setup Time 03/23/2012 01:16   Final    Culture     Final    Value:        BLOOD CULTURE RECEIVED NO GROWTH TO DATE CULTURE  WILL BE HELD FOR 5 DAYS BEFORE ISSUING A FINAL NEGATIVE REPORT   Report Status PENDING   Incomplete   URINE CULTURE     Status: Normal   Collection Time   03/23/12  3:22 PM      Component Value Range Status Comment   Specimen Description URINE, CLEAN CATCH   Final    Special Requests NONE   Final    Culture  Setup Time 03/23/2012 20:07   Final    Colony Count NO GROWTH   Final    Culture NO GROWTH   Final    Report Status 03/24/2012 FINAL   Final     Medical History: Past Medical History  Diagnosis Date  . GERD (gastroesophageal reflux disease)   . Arthritis   . Stroke 10/2002    no residual  . History of TIAs   . Diverticulosis   . PUD (peptic ulcer disease)   . PVD (peripheral vascular disease)   . Hyperlipidemia   . Anxiety   . Depression   . 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 Blood transfusion   . COPD (  chronic obstructive pulmonary disease)   . Lymphona, mantle cell, inguinal region/lower limb     Medications:  Scheduled:     . bisoprolol  5 mg Oral Daily  . ceFEPime (MAXIPIME) IV  2 g Intravenous Q8H  . diltiazem  30 mg Oral TID  . docusate sodium  100 mg Oral BID  . tiotropium  18 mcg Inhalation Daily  . vancomycin  1,250 mg Intravenous Q12H  . warfarin  1 mg Oral ONCE-1800  . Warfarin - Pharmacist Dosing Inpatient   Does not apply q1800  . DISCONTD: fluconazole  200 mg Oral Daily  . DISCONTD: vancomycin  750 mg Intravenous Q12H   Infusions:     . sodium chloride 100 mL/hr at 03/25/12 0441   Assessment: 84 YOF with NHL, s/p R-CHOP on 03/15/2012 directly admitted from [facility] with febrile neutropenia, mucositis. Patient on chronic warfarin PTA for a.fib.   Home dose of warfarin PTA was 7.5 mg daily  INR trending down after spike on 8/22 now INR @ 3.08 from 5.26  Likely 2/2 drug interaction with fluconazole (and patient previously on cipro outpt) - Fluconazole has been d/c'd  No bleeding noted per  RN  Goal of Therapy:  INR = 2-3  Plan:   Give low dose Coumadin 1 mg x 1 PO today  Daily PT/INR  Monitor for bleeding  [name], Pharm.D. Pager # [phone]                                                                     03/25/2012,8:23 AM

## 2012-03-25 NOTE — Progress Notes (Signed)
   [facility]    OFFICE PROGRESS NOTE   INTERVAL HISTORY:   She feels better and wants to go home. The lips or sore. Her oral intake has been limited.  Objective:  Vital signs in last 24 hours:  Blood pressure 134/60, pulse 82, temperature 99 F (37.2 C), temperature source Oral, resp. rate 18, height 5' 4.5" (1.638 m), weight 192 lb (87.091 kg), SpO2 94.00%.  MAXIMUM TEMPERATURE-100.8  HEENT: No thrush or ulcers,? angular chelitis versus mucositis at corner of the lips. Resp: Good air movement bilaterally no respiratory distress Cardio: Regular rate and rhythm GI: Nontender, soft Vascular: No leg edema Neuro: Alert and oriented    Portacath/PICC-without erythema  Lab Results:  Lab Results  Component Value Date   WBC 4.5 03/25/2012   HGB 10.3* 03/25/2012   HCT 31.3* 03/25/2012   MCV 83.5 03/25/2012   PLT 76* 03/25/2012   ANC 4.0 PT-INR 3.08   Medications: I have reviewed the patient's current medications.  Assessment/Plan: 1. Febrile neutropenia. Low-grade fever persists. Continue vancomycin/cefepime and followup on the blood and urine cultures. No apparent source of infection. Cultures negative to date. Plan for discharge to home on oral antibiotics 03/26/2012 2. Oral candidiasis-resolved, discontinue Diflucan 3. Non-Hodgkin's lymphoma, large B cell lymphoma, CD20 positive. Status post one cycle of R-CHOP, 03/15/2012. Will plan further chemotherapy as an outpatient once she has recovered from the febrile neutropenia.  4. Dehydration-improved 5. Pain. Due to mucositis and thrush, improved. Vicodin as needed, try Magic mouthwash  6. COPD. Spiriva/nebulizers as needed 7. Anticoagulation therapy-the PT/INR is therapeutic today, continue Coumadin per pharmacy 8. Pancytopenia secondary to chemotherapy-she received Neulasta on 03/16/2012. The white count has recovered and the platelets are higher today.  Disposition:  The white count has recovered. There is  a persistent low-grade fever and she remains in a weakened condition. The cultures are negative to date. The plan is to discharge her to home on 03/26/2012 if she is afebrile and ambulatory. She can be discharged on 5 days of ciprofloxacin. We will arrange for an outpatient prothrombin time in the office next week. Thornton Papas, MD  03/25/2012  5:22 PM

## 2012-03-26 DIAGNOSIS — E860 Dehydration: Secondary | ICD-10-CM

## 2012-03-26 DIAGNOSIS — B370 Candidal stomatitis: Secondary | ICD-10-CM

## 2012-03-26 LAB — CBC WITH DIFFERENTIAL/PLATELET
Basophils Absolute: 0 10*3/uL (ref 0.0–0.1)
Eosinophils Relative: 1 % (ref 0–5)
MCH: 27.4 pg (ref 26.0–34.0)
Monocytes Absolute: 0.5 10*3/uL (ref 0.1–1.0)
Monocytes Relative: 8 % (ref 3–12)
Neutrophils Relative %: 86 % — ABNORMAL HIGH (ref 43–77)
Platelets: 128 10*3/uL — ABNORMAL LOW (ref 150–400)
RBC: 3.8 MIL/uL — ABNORMAL LOW (ref 3.87–5.11)
WBC: 5.7 10*3/uL (ref 4.0–10.5)

## 2012-03-26 LAB — PROTIME-INR: Prothrombin Time: 24 seconds — ABNORMAL HIGH (ref 11.6–15.2)

## 2012-03-26 MED ORDER — LEVOFLOXACIN 500 MG PO TABS: 500.0000 mg | ORAL_TABLET | Freq: Every day | ORAL | Status: AC

## 2012-03-26 MED ORDER — HEPARIN SOD (PORK) LOCK FLUSH 100 UNIT/ML IV SOLN
INTRAVENOUS | Status: AC
  Filled 2012-03-26: qty 5

## 2012-03-26 MED ORDER — WARFARIN SODIUM 5 MG PO TABS: 5.0000 mg | ORAL_TABLET | Freq: Every day | ORAL | Status: DC

## 2012-03-26 NOTE — Progress Notes (Signed)
Patient given all discharge information, and instructed on changes to medication regimen.  Patient and family verbalized an understanding of all discharge instructions.  Patient left on wheelchair, with family transporting patient.  Philomena Doheny RN

## 2012-03-26 NOTE — Progress Notes (Signed)
Subjective: The patient is seen and examined today. She is feeling fine with no specific complaints. She is ready for discharge home. She denied having any significant fever or chills, no nausea or vomiting. He has no chest pain or shortness of breath.  Objective: Vital signs in last 24 hours: Temp:  [99 F (37.2 C)-99.7 F (37.6 C)] 99.7 F (37.6 C) (08/24 0631) Pulse Rate:  [82-92] 89  (08/24 0631) Resp:  [18-20] 20  (08/24 0631) BP: (134-162)/(60-80) 159/79 mmHg (08/24 0631) SpO2:  [91 %-94 %] 94 % (08/24 0631)  Intake/Output from previous day: 08/23 0701 - 08/24 0700 In: 3680 [P.O.:480; I.V.:1550; IV Piggyback:1650] Out: 2450 [Urine:2450] Intake/Output this shift:    General appearance: alert, cooperative and no distress Resp: clear to auscultation bilaterally Cardio: regular rate and rhythm, S1, S2 normal, no murmur, click, rub or gallop GI: soft, non-tender; bowel sounds normal; no masses,  no organomegaly Extremities: extremities normal, atraumatic, no cyanosis or edema  Lab Results:   Basename 03/26/12 0543 03/25/12 0530  WBC 5.7 4.5  HGB 10.4* 10.3*  HCT 31.8* 31.3*  PLT 128* 76*   BMET No results found for this basename: NA:2,K:2,CL:2,CO2:2,GLUCOSE:2,BUN:2,CREATININE:2,CALCIUM:2 in the last 72 hours  Studies/Results: No results found.  Medications: I have reviewed the patient's current medications.  CODE STATUS: Full code  Assessment/Plan: 1. Febrile neutropenia, resolved No apparent source of infection.  2. Oral candidiasis. Will continue oral Diflucan.  3. Non-Hodgkin's lymphoma, large B cell lymphoma, CD20 positive. Status post one cycle of R-CHOP, 03/15/2012. Will plan further chemotherapy as an outpatient once she has recovered from her febrile neutropenia.  4. Dehydration. She has poor oral intake due to with sore throat , continue IV fluid  5. Pain. Due to mucositis and thrush, improved. Vicodin or morphine sulfate as needed.  6. COPD.  Spiriva/nebulizers as needed  7. Anticoagulation therapy-the PT/INR will be monitored closely while she is on antibiotics , therapeutic today  8. Pancytopenia secondary to chemotherapy-she received Neulasta on 03/16/2012. Hopefully the white count will recover over the next few days. We continue to monitor the platelet count daily. The patient is doing fine with no specific complaints today. We will discharge her home per Dr. Kalman Drape recommendation. She would have followup visit with him as previously scheduled.    LOS: 4 days    [name] K. 03/26/2012

## 2012-03-27 ENCOUNTER — Inpatient Hospital Stay (HOSPITAL_COMMUNITY): Payer: Medicare Other

## 2012-03-27 ENCOUNTER — Other Ambulatory Visit: Payer: Self-pay | Admitting: Oncology

## 2012-03-27 ENCOUNTER — Encounter (HOSPITAL_COMMUNITY): Payer: Self-pay

## 2012-03-27 ENCOUNTER — Inpatient Hospital Stay (HOSPITAL_COMMUNITY)
Admission: AD | Admit: 2012-03-27 | Discharge: 2012-03-31 | DRG: 193 | Disposition: A | Discharge status: Home / Self Care | Payer: Medicare Other | Source: Ambulatory Visit | Attending: Oncology | Admitting: Oncology

## 2012-03-27 DIAGNOSIS — I10 Essential (primary) hypertension: Secondary | ICD-10-CM

## 2012-03-27 DIAGNOSIS — R509 Fever, unspecified: Secondary | ICD-10-CM

## 2012-03-27 DIAGNOSIS — D61810 Antineoplastic chemotherapy induced pancytopenia: Secondary | ICD-10-CM | POA: Diagnosis present

## 2012-03-27 DIAGNOSIS — E860 Dehydration: Secondary | ICD-10-CM

## 2012-03-27 DIAGNOSIS — F419 Anxiety disorder, unspecified: Secondary | ICD-10-CM

## 2012-03-27 DIAGNOSIS — J449 Chronic obstructive pulmonary disease, unspecified: Secondary | ICD-10-CM | POA: Diagnosis present

## 2012-03-27 DIAGNOSIS — R1310 Dysphagia, unspecified: Secondary | ICD-10-CM

## 2012-03-27 DIAGNOSIS — J4489 Other specified chronic obstructive pulmonary disease: Secondary | ICD-10-CM | POA: Diagnosis present

## 2012-03-27 DIAGNOSIS — C8510 Unspecified B-cell lymphoma, unspecified site: Secondary | ICD-10-CM

## 2012-03-27 DIAGNOSIS — I4891 Unspecified atrial fibrillation: Secondary | ICD-10-CM

## 2012-03-27 DIAGNOSIS — B370 Candidal stomatitis: Secondary | ICD-10-CM | POA: Diagnosis present

## 2012-03-27 DIAGNOSIS — K1230 Oral mucositis (ulcerative), unspecified: Secondary | ICD-10-CM

## 2012-03-27 DIAGNOSIS — R079 Chest pain, unspecified: Secondary | ICD-10-CM

## 2012-03-27 DIAGNOSIS — K1231 Oral mucositis (ulcerative) due to antineoplastic therapy: Secondary | ICD-10-CM | POA: Diagnosis present

## 2012-03-27 DIAGNOSIS — C8589 Other specified types of non-Hodgkin lymphoma, extranodal and solid organ sites: Secondary | ICD-10-CM | POA: Diagnosis present

## 2012-03-27 DIAGNOSIS — J189 Pneumonia, unspecified organism: Principal | ICD-10-CM | POA: Diagnosis present

## 2012-03-27 DIAGNOSIS — T451X5A Adverse effect of antineoplastic and immunosuppressive drugs, initial encounter: Secondary | ICD-10-CM | POA: Diagnosis present

## 2012-03-27 LAB — CBC WITH DIFFERENTIAL/PLATELET
Basophils Absolute: 0 10*3/uL (ref 0.0–0.1)
HCT: 39.6 % (ref 36.0–46.0)
Hemoglobin: 13.7 g/dL (ref 12.0–15.0)
Lymphocytes Relative: 5 % — ABNORMAL LOW (ref 12–46)
Monocytes Absolute: 0.6 10*3/uL (ref 0.1–1.0)
Monocytes Relative: 8 % (ref 3–12)
Neutro Abs: 6.1 10*3/uL (ref 1.7–7.7)
Neutrophils Relative %: 87 % — ABNORMAL HIGH (ref 43–77)
WBC: 7.1 10*3/uL (ref 4.0–10.5)

## 2012-03-27 LAB — COMPREHENSIVE METABOLIC PANEL
AST: 24 U/L (ref 0–37)
Alkaline Phosphatase: 80 U/L (ref 39–117)
BUN: 9 mg/dL (ref 6–23)
CO2: 27 mEq/L (ref 19–32)
Chloride: 94 mEq/L — ABNORMAL LOW (ref 96–112)
Creatinine, Ser: 0.8 mg/dL (ref 0.50–1.10)
GFR calc non Af Amer: 67 mL/min — ABNORMAL LOW (ref >90)
Potassium: 2.9 mEq/L — ABNORMAL LOW (ref 3.5–5.1)
Total Bilirubin: 0.6 mg/dL (ref 0.3–1.2)

## 2012-03-27 LAB — URIC ACID: Uric Acid, Serum: 4.8 mg/dL (ref 2.4–7.0)

## 2012-03-27 LAB — URINALYSIS, ROUTINE W REFLEX MICROSCOPIC
Leukocytes, UA: NEGATIVE
Nitrite: NEGATIVE
Protein, ur: 100 mg/dL — AB
Specific Gravity, Urine: 1.023 (ref 1.005–1.030)
Urobilinogen, UA: 0.2 mg/dL (ref 0.0–1.0)

## 2012-03-27 LAB — MAGNESIUM: Magnesium: 1.7 mg/dL (ref 1.5–2.5)

## 2012-03-27 LAB — URINE MICROSCOPIC-ADD ON

## 2012-03-27 LAB — PROTIME-INR
INR: 1.83 — ABNORMAL HIGH (ref 0.00–1.49)
Prothrombin Time: 21.5 seconds — ABNORMAL HIGH (ref 11.6–15.2)

## 2012-03-27 MED ORDER — ACYCLOVIR 200 MG PO CAPS
400.0000 mg | ORAL_CAPSULE | Freq: Two times a day (BID) | ORAL | Status: DC
  Administered 2012-03-27 – 2012-03-29 (×5): 400 mg via ORAL
  Filled 2012-03-27 (×7): qty 2

## 2012-03-27 MED ORDER — WARFARIN SODIUM 3 MG PO TABS
3.0000 mg | ORAL_TABLET | Freq: Once | ORAL | Status: AC
  Administered 2012-03-27: 3 mg via ORAL
  Filled 2012-03-27: qty 1

## 2012-03-27 MED ORDER — TIOTROPIUM BROMIDE MONOHYDRATE 18 MCG IN CAPS
18.0000 ug | ORAL_CAPSULE | Freq: Every day | RESPIRATORY_TRACT | Status: DC
  Administered 2012-03-28 – 2012-03-31 (×4): 18 ug via RESPIRATORY_TRACT
  Filled 2012-03-27: qty 5

## 2012-03-27 MED ORDER — PROCHLORPERAZINE MALEATE 10 MG PO TABS: 10.0000 mg | ORAL_TABLET | Freq: Four times a day (QID) | ORAL | Status: DC | PRN

## 2012-03-27 MED ORDER — LEVOFLOXACIN IN D5W 750 MG/150ML IV SOLN
750.0000 mg | INTRAVENOUS | Status: DC
  Administered 2012-03-28 (×2): 750 mg via INTRAVENOUS
  Filled 2012-03-27 (×2): qty 150

## 2012-03-27 MED ORDER — WARFARIN - PHARMACIST DOSING INPATIENT: Freq: Every day | Status: DC

## 2012-03-27 MED ORDER — SODIUM CHLORIDE 0.9 % IV SOLN
INTRAVENOUS | Status: DC
  Administered 2012-03-27 – 2012-03-28 (×2): via INTRAVENOUS

## 2012-03-27 MED ORDER — POTASSIUM CHLORIDE 10 MEQ/50ML IV SOLN
10.0000 meq | INTRAVENOUS | Status: AC
  Administered 2012-03-27 – 2012-03-28 (×6): 10 meq via INTRAVENOUS
  Filled 2012-03-27 (×6): qty 50

## 2012-03-27 MED ORDER — ENOXAPARIN SODIUM 40 MG/0.4ML ~~LOC~~ SOLN: 40.0000 mg | SUBCUTANEOUS | Status: DC

## 2012-03-27 MED ORDER — DILTIAZEM HCL 30 MG PO TABS
30.0000 mg | ORAL_TABLET | Freq: Three times a day (TID) | ORAL | Status: DC
  Administered 2012-03-27 – 2012-03-31 (×12): 30 mg via ORAL
  Filled 2012-03-27 (×14): qty 1

## 2012-03-27 MED ORDER — BISOPROLOL FUMARATE 5 MG PO TABS
5.0000 mg | ORAL_TABLET | Freq: Every day | ORAL | Status: DC
  Administered 2012-03-27 – 2012-03-31 (×5): 5 mg via ORAL
  Filled 2012-03-27 (×5): qty 1

## 2012-03-27 MED ORDER — FLUCONAZOLE 100 MG PO TABS
100.0000 mg | ORAL_TABLET | Freq: Every day | ORAL | Status: DC
  Administered 2012-03-27 – 2012-03-31 (×5): 100 mg via ORAL
  Filled 2012-03-27 (×5): qty 1

## 2012-03-27 MED ORDER — TRAMADOL HCL 50 MG PO TABS: 50.0000 mg | ORAL_TABLET | Freq: Four times a day (QID) | ORAL | Status: DC | PRN

## 2012-03-27 MED ORDER — ALPRAZOLAM 0.5 MG PO TABS
0.5000 mg | ORAL_TABLET | Freq: Three times a day (TID) | ORAL | Status: DC | PRN
  Administered 2012-03-27 – 2012-03-30 (×3): 0.5 mg via ORAL
  Filled 2012-03-27 (×3): qty 1

## 2012-03-27 MED ORDER — VITAMIN D (ERGOCALCIFEROL) 1.25 MG (50000 UNIT) PO CAPS
50000.0000 [IU] | ORAL_CAPSULE | ORAL | Status: DC
  Administered 2012-03-27: 50000 [IU] via ORAL
  Filled 2012-03-27: qty 1

## 2012-03-27 MED ORDER — DOCUSATE SODIUM 100 MG PO CAPS
100.0000 mg | ORAL_CAPSULE | Freq: Two times a day (BID) | ORAL | Status: DC
  Administered 2012-03-27 – 2012-03-28 (×2): 100 mg via ORAL
  Filled 2012-03-27 (×3): qty 1

## 2012-03-27 MED ORDER — LIDOCAINE-PRILOCAINE 2.5-2.5 % EX CREA: TOPICAL_CREAM | Freq: Once | CUTANEOUS | Status: DC

## 2012-03-27 MED ORDER — WARFARIN SODIUM 5 MG PO TABS: 5.0000 mg | ORAL_TABLET | Freq: Every day | ORAL | Status: DC

## 2012-03-27 NOTE — Progress Notes (Signed)
ANTICOAGULATION CONSULT NOTE - Initial Consult  Pharmacy Consult for Warfarin Indication: atrial fibrillation  Allergies  Allergen Reactions  . Propoxyphene Hcl   . Propoxyphene-Acetaminophen     REACTION: Reaction not known    Patient Measurements: Height: 5\' 4"  (162.6 cm) Weight: 186 lb 15.2 oz (84.8 kg) IBW/kg (Calculated) : 54.7   Vital Signs: Temp: 101.4 F (38.6 C) (08/25 1405) Temp src: Oral (08/25 1405) BP: 145/86 mmHg (08/25 1405) Pulse Rate: 0  (08/25 1405)  Labs:  Basename 03/27/12 1810 03/26/12 0543 03/25/12 0530  HGB 13.7 10.4* --  HCT 39.6 31.8* 31.3*  PLT 179 128* 76*  APTT -- -- --  LABPROT 21.5* 24.0* 32.3*  INR 1.83* 2.11* 3.08*  HEPARINUNFRC -- -- --  CREATININE -- -- --  CKTOTAL -- -- --  CKMB -- -- --  TROPONINI -- -- --    Estimated Creatinine Clearance: 50.2 ml/min (by C-G formula based on Cr of 0.91).   Medical History: Past Medical History  Diagnosis Date  . GERD (gastroesophageal reflux disease)   . Arthritis   . Stroke 10/2002    no residual  . History of TIAs   . Diverticulosis   . PUD (peptic ulcer disease)   . PVD (peripheral vascular disease)   . Hyperlipidemia   . Anxiety   . Depression   . 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 Blood transfusion   . COPD (chronic obstructive pulmonary disease)   . Lymphona, mantle cell, inguinal region/lower limb     Assessment: 84 YOF readmitted for hydration and reassessment of fever following discharge for febrile neutropenia yesterday.  Patient is on chronic warfarin PTA for atrial fibrillation.  Patient's home warfarin dose was previously 7.5 mg daily but had been requiring much smaller doses d/t interaction with Fluconazole for oral thrush.  Patient was discharged with instructions to take 5 mg daily and reportedly took 5 mg on 8/24.  Fluconazole was continued today.  INR slightly subtherapeutic today at 1.83.  Goal of Therapy:    INR 2-3   Plan:  Since patient took 5 mg dose last night, will be cautious considering recent history of supratherapeutic INR while on fluconazole, and start with warfarin 3 mg po once tonight. Follow up INR in AM.  Clance Boll 03/27/2012,6:47 PM

## 2012-03-27 NOTE — H&P (Signed)
ID: [name]   DOB: Feb 03, 1928  MR#: [id number]  WUJ#:[id number]  HISTORY OF PRESENT ILLNESS:  She developed the acute onset of bright red blood per rectum on approximately 01/10/2012. This lasted for 7 days. She was referred for a CT of the abdomen and pelvis on 01/15/2012. No pleural or pericardial effusion was noted. No focal liver abnormality. The pancreas appeared normal. Both adrenal glands appeared normal. A subtle lesion at the upper pole of the right kidney measured 1.4 cm compared to a previous measurement of 1 cm in September of 2011. The bladder and uterus appeared normal. There is a left ovarian cyst. Multiple enlarged upper abdominal and pelvic lymph nodes were seen. A portacaval node measured 1.3 cm, a periaortic node measured 1.4 cm, a right iliac node measured 2.6 cm. The stomach, small bowel, and colon appeared normal.  She was referred to Dr. Arlyce Dice and underwent an upper endoscopy on 02/11/2012. An AVM was noted in the body of the stomach, nonbleeding. An AVM was noted in the distal esophagus. The examination was otherwise normal. A colonoscopy revealed moderate diverticulosis in the sigmoid colon. There was colitis in the sigmoid colon. Biopsies were taken. The examination was otherwise normal. External hemorrhoids were noted on digital exam. The biopsy revealed benign colonic mucosa. No active colitis, granulomata, or adenomatous epithelium was identified.  She was referred for a CT-guided biopsy of the right iliac lymph node on 02/12/2012. The pathology [id number]) confirmed a large B-cell lymphoma. The core biopsy showed lymphoid tissue involved by a lymphoproliferative process characterized by abundant large lymphoid cells. Brisk mitosis and a proptosis were noted. Definite follicular structures were not identified. Flow cytometry was noncontributory. Immunohistochemical stains showed the large atypical lymphoid cells were positive for LCA, CD20, and CD79a. They were negative  for CD3, ALK protein, CD15, cytokeratin AE1/AE3, and S100. Her subsequent history is as detailed below.   INTERVAL HISTORY: [name] was discharged from [name] 03/26/2012 (yesterday), after resolution of her fever and neutropenia, which had let to admission. When she got home she found she could not eat anything--"my thrush is terrible, it hurts to swallow, I can't get my teeth in." She also was not drinking--according to family despite everyone's best efforts she has ha less than 8 oz fluid today. Patient says she "just doesn't want to drink," because the only think she likes is iced tea, and even that causes her pain when she swallows. She has remained bed-bound since discharge except for going to the [facility] with assistance. The family was even more concerned that her temperature respiked today, from 99.7 in AM to 103/0 by noon. We are re-admitting the patient for hydration and fever workup.  REVIEW OF SYSTEMS: She denies headaches, visual changes, nausea or vomiting. She doesn't feel steady on her feet but denies vertigo. C/O odynophagia, taste perversion, loss of appetite. BM 2 days ago was normal. Has significant stress incontinence, which she describes as new. She has been complaining of sharp pains in her abdomen, and Right scapular area. The pains are not related--one can occur w/o the other. Denies cough, phleg, worsening SOB, bleeding or rash. A detailed ROS was otherwise noncontributory.  PAST MEDICAL HISTORY: Past Medical History  Diagnosis Date  . GERD (gastroesophageal reflux disease)   . Arthritis   . Stroke 10/2002    no residual  . History of TIAs   . Diverticulosis   . PUD (peptic ulcer disease)   . PVD (peripheral vascular disease)   . Hyperlipidemia   .  Anxiety   . Depression   . 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 Blood transfusion   . COPD (chronic obstructive pulmonary disease)   . Lymphona, mantle cell, inguinal  region/lower limb     PAST SURGICAL HISTORY: Past Surgical History  Procedure Date  . Appendectomy   . Colon surgery 09/13/2008    iliostomy for diverticulitis  . Hernia repair   . Tubal ligation   . Throat surgery 1986  . Total knee arthroplasty     2002 left 2008 right  . Reversal of iliostomy 01/2010  . Cataract extraction 2005    bilateral  . Carotid surgery     2004 a month apart right and left    FAMILY HISTORY Family History  Problem Relation Age of Onset  . Colon cancer Sister     colon  . Hyperlipidemia Mother   . Stroke Mother   . Cancer Father     mesothelioma    SOCIAL HISTORY:  Lives at home with husband Ed. Daughters Clydie Braun (present today) and Thayer Ohm as both very involved in the patient's care  ADVANCED DIRECTIVES:  HEALTH MAINTENANCE: History  Substance Use Topics  . Smoking status: Former Games developer  . Smokeless tobacco: Never Used  . Alcohol Use: No      Allergies  Allergen Reactions  . Propoxyphene Hcl   . Propoxyphene-Acetaminophen     REACTION: Reaction not known    No current facility-administered medications for this encounter.    OBJECTIVE: elderly White woman examined in bed There were no vitals filed for this visit.   There is no height or weight on file to calculate BMI.    ECOG FS: 3  T 101.4, HR 89 (repeat 86 and reg.), RR 20 (repeat 16), BP 145/86. Sat 96% RA Sclerae unicteric Oropharynx clear--I do not see obvious thrush in the oral cavity Lungs no rales or rhonchi Heart regular rate and rhythm, no murmur appreciated Abd is soft, slightly distended but not terse, nontender; no masses palpated; BS decreased MSK minimal bilateral ankle edema Neuro: nonfocal: 5/5 grip B UE; EOMs full Breasts: deferred  LAB RESULTS: Lab Results  Component Value Date   WBC 5.7 03/26/2012   NEUTROABS 4.8 03/26/2012   HGB 10.4* 03/26/2012   HCT 31.8* 03/26/2012   MCV 83.7 03/26/2012   PLT 128* 03/26/2012    @LASTCHEMISTRY @  No results found for  this basename: LABCA2    No components found with this basename: [id number]     Lab 03/26/12 0543  INR 2.11*    Urinalysis    Component Value Date/Time   COLORURINE AMBER* 03/21/2012 0833   APPEARANCEUR CLOUDY* 03/21/2012 0833   LABSPEC 1.021 03/21/2012 0833   PHURINE 6.0 03/21/2012 0833   GLUCOSEU NEGATIVE 03/21/2012 0833   HGBUR LARGE* 03/21/2012 0833   HGBUR trace-lysed 02/13/2010 0830   BILIRUBINUR NEGATIVE 03/21/2012 0833   KETONESUR NEGATIVE 03/21/2012 0833   PROTEINUR 30* 03/21/2012 0833   UROBILINOGEN 0.2 03/21/2012 0833   NITRITE NEGATIVE 03/21/2012 0833   LEUKOCYTESUR MODERATE* 03/21/2012 0833    STUDIES: Dg Chest 2 View  03/21/2012  *RADIOLOGY REPORT*  Clinical Data: Short of breath.  Difficulty swallowing.  Chest soreness.  History of lymphoma and laryngeal cancer.  CHEST - 2 VIEW  Comparison: Chest radiography 12/22/2009.  PET scan 02/25/2012.  Findings: Power port is in place with its tip in the SVC 2 cm above the right atrium.  Heart size is  normal.  Mediastinal shadows show unfolding of the aorta.  There is mild right hilar prominence compared to the left.  The lungs are clear except for mild bronchial thickening.  No effusions.  No significant bony findings.  IMPRESSION: Mild bronchial thickening. Mild prominence of the right hilar shadows compared to the left.  No other active process.   Original Report Authenticated By: Thomasenia Sales, M.D. ( 03/21/2012 09:01:14 )    Ir Horace Porteous Guide Cv Line Right  03/09/2012  *RADIOLOGY REPORT*  Indication: History of non-Hodgkin's lymphoma, in need of porta- catheter for chemotherapy administration  IMPLANTED PORT A CATH PLACEMENT WITH ULTRASOUND AND FLUOROSCOPIC GUIDANCE  Sedation: Versed 2 mg IV; Fentanyl 100 mcg IV; Ancef 2 gm IV; IV antibiotic was given in an appropriate time interval prior to skin puncture.  Total Moderate Sedation Time: 25 minutes.  Contrast: None  Fluoroscopy Time: 0.4 minutes.  Complications: None immediate  Procedure:   The procedure, risks, benefits, and alternatives were explained to the patient.  Questions regarding the procedure were encouraged and answered.  The patient understands and consents to the procedure.  The right neck and chest were prepped with chlorhexidine in a sterile fashion, and a sterile drape was applied covering the operative field.  Maximum barrier sterile technique with sterile gowns and gloves were used for the procedure.  A timeout was performed prior to the initiation of the procedure.  Local anesthesia was provided with 1% lidocaine with epinephrine.  After creating a small venotomy incision, a micropuncture kit was utilized to access the right internal jugular vein under direct, real-time ultrasound guidance.  Ultrasound image documentation was performed.  The microwire was kinked to measure appropriate catheter length.  A subcutaneous port pocket was then created along the upper chest wall utilizing a combination of sharp and blunt dissection.  The pocket was irrigated with sterile saline.  A single lumen power injectable port was chosen for placement.  The 8 Fr catheter was tunneled from the port pocket site to the venotomy incision.  The port was placed in the pocket.  The external catheter was trimmed to appropriate length.  At the venotomy, an 8 Fr peel-away sheath was placed over a guidewire under fluoroscopic guidance.  The catheter was then placed through the sheath and the sheath was removed.  Final catheter positioning was confirmed and documented with a fluoroscopic spot radiograph.  The port was accessed with a Huber needle, aspirated and flushed with heparinized saline.  The venotomy site was closed with an interrupted 4-0 Vicryl suture. The port pocket incision was closed with interrupted 2-0 Vicryl suture and the skin was opposed with a running subcuticular 4-0 Vicryl suture.  Dermabond and Steri-strips were applied to both incisions.  Dressings were placed.  The patient tolerated the  procedure well without immediate post procedural complication.  Findings:  After catheter placement, the tip lies at the superior aspect of the right atrium.  The catheter aspirates and flushes normally and is ready for immediate use.  IMPRESSION:  Successful placement of a right internal jugular approach single lumen power injectable Port-A-Cath.  The catheter is ready for immediate use.  Original Report Authenticated By: Waynard Reeds, M.D.   Ir US Guide Vasc Access Right  03/09/2012  *RADIOLOGY REPORT*  Indication: History of non-Hodgkin's lymphoma, in need of porta- catheter for chemotherapy administration  IMPLANTED PORT A CATH PLACEMENT WITH ULTRASOUND AND FLUOROSCOPIC GUIDANCE  Sedation: Versed 2 mg IV; Fentanyl 100 mcg IV; Ancef 2 gm  IV; IV antibiotic was given in an appropriate time interval prior to skin puncture.  Total Moderate Sedation Time: 25 minutes.  Contrast: None  Fluoroscopy Time: 0.4 minutes.  Complications: None immediate  Procedure:  The procedure, risks, benefits, and alternatives were explained to the patient.  Questions regarding the procedure were encouraged and answered.  The patient understands and consents to the procedure.  The right neck and chest were prepped with chlorhexidine in a sterile fashion, and a sterile drape was applied covering the operative field.  Maximum barrier sterile technique with sterile gowns and gloves were used for the procedure.  A timeout was performed prior to the initiation of the procedure.  Local anesthesia was provided with 1% lidocaine with epinephrine.  After creating a small venotomy incision, a micropuncture kit was utilized to access the right internal jugular vein under direct, real-time ultrasound guidance.  Ultrasound image documentation was performed.  The microwire was kinked to measure appropriate catheter length.  A subcutaneous port pocket was then created along the upper chest wall utilizing a combination of sharp and blunt dissection.   The pocket was irrigated with sterile saline.  A single lumen power injectable port was chosen for placement.  The 8 Fr catheter was tunneled from the port pocket site to the venotomy incision.  The port was placed in the pocket.  The external catheter was trimmed to appropriate length.  At the venotomy, an 8 Fr peel-away sheath was placed over a guidewire under fluoroscopic guidance.  The catheter was then placed through the sheath and the sheath was removed.  Final catheter positioning was confirmed and documented with a fluoroscopic spot radiograph.  The port was accessed with a Huber needle, aspirated and flushed with heparinized saline.  The venotomy site was closed with an interrupted 4-0 Vicryl suture. The port pocket incision was closed with interrupted 2-0 Vicryl suture and the skin was opposed with a running subcuticular 4-0 Vicryl suture.  Dermabond and Steri-strips were applied to both incisions.  Dressings were placed.  The patient tolerated the procedure well without immediate post procedural complication.  Findings:  After catheter placement, the tip lies at the superior aspect of the right atrium.  The catheter aspirates and flushes normally and is ready for immediate use.  IMPRESSION:  Successful placement of a right internal jugular approach single lumen power injectable Port-A-Cath.  The catheter is ready for immediate use.  Original Report Authenticated By: Waynard Reeds, M.D.   Ct Biopsy  03/09/2012  *RADIOLOGY REPORT*  Indication: New diagnosis of large B-cell lymphoma, evaluate for non-Hodgkin's lymphoma.  CT GUIDED LEFT ILIAC BONE MARROW ASPIRATION AND BONE MARROW CORE BIOPSIES  Intravenous medications: Fentanyl 100 mcg IV; Versed 1 mg IV  Sedation time: 10 minutes  Contrast volume: None  Complications: None immediate  PROCEDURE/FINDINGS:  Informed consent was obtained from the patient following an explanation of the procedure, risks, benefits and alternatives. The patient understands,  agrees and consents for the procedure. All questions were addressed.  A time out was performed prior to the initiation of the procedure.  The patient was positioned prone and noncontrast localization CT was performed of the pelvis to demonstrate the iliac marrow spaces.  The operative site was prepped and draped in the usual sterile fashion.  Under sterile conditions and local anesthesia, an 11 gauge coaxial bone biopsy needle was advanced into the left iliac marrow space. Needle position was confirmed with CT imaging.  Initially, bone marrow aspiration was performed. Next, a  bone marrow biopsy was obtained with the 11 gauge outer bone marrow device.  Samples were prepared with the cytotechnologist and deemed adequate.  The needle was removed intact.  Hemostasis was obtained with compression and a dressing was placed. The patient tolerated the procedure well without immediate post procedural complication.  IMPRESSION:  Successful CT guided left iliac bone marrow aspiration and core biopsy.  Original Report Authenticated By: Waynard Reeds, M.D.    ASSESSMENT: 78 y.o. [place] woman with Non-Hodgkin's lymphoma, large B cell subtype, CD20 positive, s/p one cycle of R-CHOP, 03/15/2012, complicated by 1. Febrile neutropenia, s/p course of vanco/cefepime with apparent resolution, now with recurrent fever 2. Oral candidiasis-resolved, discontinue Diflucan  3. Dehydration--currently dependent on IVF  5. Odynophagia, felt to be due to mucositis and thrush 6. COPD. Spiriva/nebulizers as needed  7. Anticoagulation therapy for history TIAs  PLAN: We are re-admitting Ms. Darr for hydration and repeat fever workup. Her counts were adequate yesterday and if they remain so today, as I expect, will not resume antibiotics unless she becomes clinically unstable. Will continue diflucan though I do not see thrush--she may have residual mucositis as the cause of he odynophagia. We will re-culture and obtain a repeat  CXR. I discussed a foley but she refuses at this time. Will write tramadol for pain and ask pharmacy's assistance with INR. Dr. Truett Perna will follow-up tomorrow.   MAGRINAT,GUSTAV C    03/27/2012

## 2012-03-28 ENCOUNTER — Telehealth: Payer: Self-pay | Admitting: *Deleted

## 2012-03-28 DIAGNOSIS — K121 Other forms of stomatitis: Secondary | ICD-10-CM

## 2012-03-28 DIAGNOSIS — B370 Candidal stomatitis: Secondary | ICD-10-CM

## 2012-03-28 DIAGNOSIS — C8589 Other specified types of non-Hodgkin lymphoma, extranodal and solid organ sites: Secondary | ICD-10-CM

## 2012-03-28 DIAGNOSIS — J189 Pneumonia, unspecified organism: Principal | ICD-10-CM

## 2012-03-28 LAB — BASIC METABOLIC PANEL
CO2: 25 mEq/L (ref 19–32)
Chloride: 96 mEq/L (ref 96–112)
Creatinine, Ser: 0.79 mg/dL (ref 0.50–1.10)
GFR calc Af Amer: 87 mL/min — ABNORMAL LOW (ref >90)
Sodium: 129 mEq/L — ABNORMAL LOW (ref 135–145)

## 2012-03-28 LAB — PROTIME-INR: INR: 1.79 — ABNORMAL HIGH (ref 0.00–1.49)

## 2012-03-28 MED ORDER — HYDROMORPHONE HCL PF 1 MG/ML IJ SOLN
1.0000 mg | INTRAMUSCULAR | Status: DC | PRN
  Administered 2012-03-28: 0.5 mg via INTRAVENOUS
  Administered 2012-03-28 – 2012-03-29 (×2): 1 mg via INTRAVENOUS
  Filled 2012-03-28 (×3): qty 1

## 2012-03-28 MED ORDER — WARFARIN SODIUM 3 MG PO TABS
3.0000 mg | ORAL_TABLET | Freq: Once | ORAL | Status: AC
  Administered 2012-03-28: 3 mg via ORAL
  Filled 2012-03-28: qty 1

## 2012-03-28 MED ORDER — MORPHINE SULFATE 2 MG/ML IJ SOLN: 2.0000 mg | INTRAMUSCULAR | Status: DC | PRN

## 2012-03-28 MED ORDER — MAGIC MOUTHWASH
5.0000 mL | ORAL | Status: DC | PRN
  Administered 2012-03-28 – 2012-03-31 (×9): 5 mL via ORAL
  Filled 2012-03-28 (×8): qty 5

## 2012-03-28 MED ORDER — PROCHLORPERAZINE MALEATE 5 MG PO TABS: 5.0000 mg | ORAL_TABLET | Freq: Four times a day (QID) | ORAL | Status: DC | PRN

## 2012-03-28 NOTE — Telephone Encounter (Signed)
Call from pt's daughter asking if a doctor would be seeing pt today. She had tests done on 8/25 and has not learned results yet. Informed her that Dr. Truett Perna was just made aware that she was admitted to hospital and he plans to see her today. She also reports pt is having sharp back pain. Instructed her to call pt's nurse.

## 2012-03-28 NOTE — Progress Notes (Signed)
IP PROGRESS NOTE  Subjective: [name] was readmitted 03/27/2012 with fever and cough. Chest x-ray was consistent with left upper lobe pneumonia. She reports pain at the left upper back with coughing. She also has mouth and throat pain making it difficult to eat.  Objective: Vital signs in last 24 hours: Temp:  [99.8 F (37.7 C)-101.7 F (38.7 C)] 99.8 F (37.7 C) (08/26 0511) Pulse Rate:  [0-104] 101  (08/26 0511) Resp:  [18-20] 18  (08/26 0511) BP: (124-146)/(56-86) 124/56 mmHg (08/26 0511) SpO2:  [88 %-98 %] 88 % (08/26 0840) Weight:  [186 lb 15.2 oz (84.8 kg)] 186 lb 15.2 oz (84.8 kg) (08/25 1405)  Intake/Output from previous day: 08/25 0701 - 08/26 0700 In: 1308.3 [P.O.:100; I.V.:1208.3] Out: 550 [Urine:550] Intake/Output this shift:    Scattered areas of ulceration at the anterior palate. Posterior palate with white patches. Lips with crusted lesions. Rales at the left upper lung field and right base. Regular cardiac rhythm. Port-A-Cath site is without erythema. Abdomen is soft and nontender. Extremities without edema.  Lab Results:   [facility] 03/27/12 1810 03/26/12 0543  WBC 7.1 5.7  HGB 13.7 10.4*  HCT 39.6 31.8*  PLT 179 128*   BMET  Basename 03/28/12 0605 03/27/12 1810  NA 129* 132*  K 4.1 2.9*  CL 96 94*  CO2 25 27  GLUCOSE 100* 124*  BUN 8 9  CREATININE 0.79 0.80  CALCIUM 8.5 8.6    Studies/Results: Dg Chest 2 View  03/27/2012  *RADIOLOGY REPORT*  Clinical Data: Shortness of breath, weakness and fever.  CHEST - 2 VIEW  Comparison: Plain films of the chest 03/21/2012.  Findings: Since the prior study, the patient has developed a small left pleural effusion.  There is airspace disease on the left, worst in the left upper, lobe most consistent with pneumonia. Right lung appears clear.  Port-A-Cath is in place.  Heart size is normal.  IMPRESSION: Findings most consistent with left upper lobe pneumonia and associated small parapneumonic effusion.   Original  Report Authenticated By: Bernadene Bell. Maricela Curet, M.D.    Abd 1 View (kub)  03/27/2012  *RADIOLOGY REPORT*  Clinical Data: Abdominal pain, evaluate for small bowel obstruction  ABDOMEN - 1 VIEW  Comparison: CT abdomen pelvis dated 01/14/2012  Findings: Nonobstructive bowel gas pattern.  2.1 cm gallstone.  Degenerative changes of the lower lumbar spine.  IMPRESSION: Nonobstructive bowel gas pattern.  2.1 cm gallstone.   Original Report Authenticated By: Charline Bills, M.D.     Medications: Reviewed.  Assessment/Plan:  1. Left upper lobe pneumonia. Day 2 Levaquin. 2. Hospitalization with febrile neutropenia 03/22/2012 through 03/26/2012. Cultures negative. 3. Oral candidiasis. Continue Diflucan. 4. Mucositis. 5. Mouth/throat pain likely due to #3 and #4. 6. Non-Hodgkin's lymphoma, large B-cell lymphoma, CD20 positive. Status post cycle 1 CHOP/Rituxan 03/15/2012. 7. COPD. 8. Anticoagulation therapy. Coumadin per pharmacy. 9. Recent pancytopenia secondary to chemotherapy. Counts recovered.  Disposition-Ms. Pletz appears to have a left upper lobe pneumonia. Plan to continue Levaquin. She is experiencing significant mouth/throat pain likely secondary to a combination of oral candidiasis and mucositis. IV morphine and Magic mouthwash will be given as needed.     LOS: 1 day    Lonna Cobb, ANP/GNP-BC 03/28/2012 2:04 PM Ms. Dearcos was interviewed and examined by me. I agree with the note above.

## 2012-03-28 NOTE — Progress Notes (Addendum)
ANTICOAGULATION CONSULT NOTE   Pharmacy Consult for Warfarin Indication: atrial fibrillation  Allergies  Allergen Reactions  . Propoxyphene Hcl   . Propoxyphene-Acetaminophen     REACTION: Reaction not known    Patient Measurements: Height: 5\' 4"  (162.6 cm) Weight: 186 lb 15.2 oz (84.8 kg) IBW/kg (Calculated) : 54.7   Vital Signs: Temp: 99.8 F (37.7 C) (08/26 0511) Temp src: Oral (08/26 0511) BP: 124/56 mmHg (08/26 0511) Pulse Rate: 101  (08/26 0511)  Labs:  Basename 03/28/12 0605 03/27/12 1810 03/26/12 0543  HGB -- 13.7 10.4*  HCT -- 39.6 31.8*  PLT -- 179 128*  APTT -- -- --  LABPROT 21.1* 21.5* 24.0*  INR 1.79* 1.83* 2.11*  HEPARINUNFRC -- -- --  CREATININE 0.79 0.80 --  CKTOTAL -- -- --  CKMB -- -- --  TROPONINI -- -- --    Estimated Creatinine Clearance: 57.1 ml/min (by C-G formula based on Cr of 0.79).   Medical History: Past Medical History  Diagnosis Date  . GERD (gastroesophageal reflux disease)   . Arthritis   . Stroke 10/2002    no residual  . History of TIAs   . Diverticulosis   . PUD (peptic ulcer disease)   . PVD (peripheral vascular disease)   . Hyperlipidemia   . Anxiety   . Depression   . 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 Blood transfusion   . COPD (chronic obstructive pulmonary disease)   . Lymphona, mantle cell, inguinal region/lower limb     Assessment:  [name] with recent hospitalization here at [facility] 8/20-24. Pt readmitted 8/25 for hydration and reassessment of fever for febrile neutropenia.    Patient is on chronic warfarin PTA for atrial fibrillation, dose PTA 7.5 mg daily but was requiring much smaller doses d/t interaction with Fluconazole for oral thrush during last admission.  INR became supratx 8/22 (5.26)  Patient was discharged 8/24 with instructions to take 5 mg daily and reportedly took 5 mg on 8/24.    Fluconazole was continued.  Pt also started on Levaquin. Both  can increase sensitivity to warfarin  INR down some, subtherapeutic today at 1.79 after 3mg  last night  Goal of Therapy:  INR 2-3   Plan:  Coumadin 3mg  today (conservative dosing since INR became supratx last admission) Daily INR  Gwen Her PharmD  [phone] 03/28/2012 9:08 AM

## 2012-03-29 DIAGNOSIS — E876 Hypokalemia: Secondary | ICD-10-CM

## 2012-03-29 LAB — URINE CULTURE
Colony Count: NO GROWTH
Culture: NO GROWTH

## 2012-03-29 LAB — CULTURE, BLOOD (ROUTINE X 2): Culture: NO GROWTH

## 2012-03-29 LAB — BASIC METABOLIC PANEL
BUN: 12 mg/dL (ref 6–23)
CO2: 25 mEq/L (ref 19–32)
Chloride: 98 mEq/L (ref 96–112)
GFR calc Af Amer: 74 mL/min — ABNORMAL LOW (ref >90)
Potassium: 3.3 mEq/L — ABNORMAL LOW (ref 3.5–5.1)

## 2012-03-29 LAB — PROTIME-INR
INR: 1.9 — ABNORMAL HIGH (ref 0.00–1.49)
Prothrombin Time: 22.1 seconds — ABNORMAL HIGH (ref 11.6–15.2)

## 2012-03-29 MED ORDER — SODIUM CHLORIDE 0.9 % IV SOLN
INTRAVENOUS | Status: DC
  Administered 2012-03-29 – 2012-03-31 (×4): via INTRAVENOUS
  Filled 2012-03-29 (×7): qty 1000

## 2012-03-29 MED ORDER — HYDROCODONE-ACETAMINOPHEN 7.5-500 MG/15ML PO SOLN
10.0000 mL | ORAL | Status: DC | PRN
  Administered 2012-03-29 – 2012-03-30 (×4): 10 mL via ORAL
  Administered 2012-03-31: 13:00:00 via ORAL
  Filled 2012-03-29 (×5): qty 15

## 2012-03-29 MED ORDER — WARFARIN SODIUM 3 MG PO TABS
3.0000 mg | ORAL_TABLET | Freq: Once | ORAL | Status: AC
  Administered 2012-03-29: 3 mg via ORAL
  Filled 2012-03-29: qty 1

## 2012-03-29 MED ORDER — LEVOFLOXACIN 750 MG PO TABS
750.0000 mg | ORAL_TABLET | Freq: Every day | ORAL | Status: DC
  Administered 2012-03-29 – 2012-03-30 (×2): 750 mg via ORAL
  Filled 2012-03-29 (×3): qty 1

## 2012-03-29 NOTE — Progress Notes (Signed)
ANTICOAGULATION CONSULT NOTE   Pharmacy Consult for Warfarin Indication: atrial fibrillation  Allergies  Allergen Reactions  . Propoxyphene Hcl   . Propoxyphene-Acetaminophen     REACTION: Reaction not known    Patient Measurements: Height: 5\' 4"  (162.6 cm) Weight: 186 lb 15.2 oz (84.8 kg) IBW/kg (Calculated) : 54.7   Vital Signs: Temp: 98.9 F (37.2 C) (08/27 0626) Temp src: Oral (08/27 0626) BP: 95/74 mmHg (08/27 0626) Pulse Rate: 89  (08/27 0626)  Labs:  [facility] 03/29/12 0552 03/28/12 0605 03/27/12 1810  HGB -- -- 13.7  HCT -- -- 39.6  PLT -- -- 179  APTT -- -- --  LABPROT 22.1* 21.1* 21.5*  INR 1.90* 1.79* 1.83*  HEPARINUNFRC -- -- --  CREATININE 0.83 0.79 0.80  CKTOTAL -- -- --  CKMB -- -- --  TROPONINI -- -- --    Estimated Creatinine Clearance: 55 ml/min (by C-G formula based on Cr of 0.83).   Medical History: Past Medical History  Diagnosis Date  . GERD (gastroesophageal reflux disease)   . Arthritis   . Stroke 10/2002    no residual  . History of TIAs   . Diverticulosis   . PUD (peptic ulcer disease)   . PVD (peripheral vascular disease)   . Hyperlipidemia   . Anxiety   . Depression   . 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 Blood transfusion   . COPD (chronic obstructive pulmonary disease)   . Lymphona, mantle cell, inguinal region/lower limb     Assessment:  20 YOF with recent hospitalization here at [facility] 8/20-24. Pt readmitted 8/25 for hydration and reassessment of fever for febrile neutropenia.    Patient is on chronic warfarin PTA for atrial fibrillation, dose PTA 7.5 mg daily but was requiring much smaller doses d/t interaction with Fluconazole for oral thrush during last admission.  INR became supratx 8/22 (5.26)  Patient was discharged 8/24 with instructions to take 5 mg daily and reportedly took 5 mg on 8/24.    Fluconazole was continued.  Pt also started on Levaquin. Both can  increase sensitivity to warfarin  INR just below goal but increased from yesterday  Goal of Therapy:  INR 2-3   Plan:  1. Repeat warfarin 3mg  today 2. Daily INR   Hessie Knows, PharmD, BCPS Pager [phone] 03/29/2012 8:28 AM

## 2012-03-29 NOTE — Discharge Summary (Signed)
Physician Discharge Summary  Patient ID: [name] @[email or web address] @ MRN: [id number] DOB/AGE: Dec 17, 1927 78 y.o.  Admit date: 03/22/2012 Discharge date: 03/26/2012  Discharge Diagnoses:  1. febrile neutropenia-no source for infection identified during this hospital admission 2. Oral candidiasis-resolved with Diflucan 3. Non-Hodgkin's lymphoma, large B-cell lymphoma-status post cycle 1 of our-CHOP on 03/15/2012 4. Dehydration-resolved 5. Pain secondary to mucositis 6. COPD 7. Pancytopenia secondary to chemotherapy-improved at discharge   Discharged Condition: Improved  Discharge Labs:  hemoglobin 10.4, platelets 128,000, ANC 4.8    Consults: None  Procedures: None  Disposition: 01-Home or Self Care   Medication List  As of 03/29/2012  8:35 AM   STOP taking these medications         ciprofloxacin 500 MG tablet      fluconazole 200 MG tablet      PRESCRIPTION MEDICATION         TAKE these medications         ALPRAZolam 0.5 MG tablet   Commonly known as: XANAX   Take 1 tablet (0.5 mg total) by mouth 3 (three) times daily as needed for sleep.      bisoprolol 5 MG tablet   Commonly known as: ZEBETA   Take 5 mg by mouth daily.      diltiazem 30 MG tablet   Commonly known as: CARDIZEM   Take 1 tablet (30 mg total) by mouth 3 (three) times daily.      levofloxacin 500 MG tablet   Commonly known as: LEVAQUIN   Take 1 tablet (500 mg total) by mouth daily.      lidocaine-prilocaine cream   Commonly known as: EMLA   Apply to port-a-cath site 1-2 hours prior to chemo.  Cover with plastic wrap      prochlorperazine 10 MG tablet   Commonly known as: COMPAZINE   Take 10 mg by mouth every 6 (six) hours as needed. For nausea.      tiotropium 18 MCG inhalation capsule   Commonly known as: SPIRIVA   Place 18 mcg into inhaler and inhale daily.      Vitamin D (Ergocalciferol) 50000 UNITS Caps   Commonly known as: DRISDOL   Take 50,000 Units by mouth every 7  (seven) days. Taken on Sundays.      warfarin 5 MG tablet   Commonly known as: COUMADIN   Take 1 tablet (5 mg total) by mouth at bedtime.            Follow-up Information    Follow up with Thornton Papas, MD. (f/u for office visit as scheduled, office will call you for lab appt. week of 8/26)    Contact information:   [address] [place] [address] [phone]          Hospital Course: She was admitted on 03/22/2012 with a high fever in the setting of severe neutropenia. She was also noted to have thrombocytopenia.  She was placed on intravenous hydration secondary to limited oral intake in the setting of chemotherapy-induced mucositis. Cultures of the blood and urine were obtained. She was started on broad-spectrum intravenous antibiotic support. There was no apparent source for infection upon review her history and physical exam. A chest x-ray was negative in the emergency room on 03/21/2012. Cultures of the blood and urine remained negative during this hospital admission.  The fever improved. The mucositis improved. The white count and platelets recovered.  She appears stable for discharge to home on 03/26/2012.  Discharge Orders    Future Appointments: Provider: Department: Dept Phone: Center:   04/05/2012 8:00 AM Sherrie Mustache [facility] [phone] None   04/05/2012 8:30 AM Ladene Artist, MD [facility] [phone] None   04/05/2012 9:30 AM [facility] F20 [facility] [phone] None   04/06/2012 8:15 AM [facility] Inj Nurse [facility] [phone] None     Future Orders Please Complete By Expires   Diet - low sodium heart healthy      Increase activity slowly         Signed: Thornton Papas 03/29/2012, 8:36 AM

## 2012-03-29 NOTE — Progress Notes (Signed)
CARE MANAGEMENT NOTE 03/29/2012  Patient:  JAYCE, BOYKO   Account Number:  [id number]  Date Initiated:  03/29/2012  Documentation initiated by:  Tranise Forrest  Subjective/Objective Assessment:   pt admitted with fever 101.2, neutropenic and dehydration.     Action/Plan:   from home   Anticipated DC Date:  04/01/2012   Anticipated DC Plan:  HOME/SELF CARE  In-house referral  NA      DC Planning Services  NA      [facility] Choice  NA   Choice offered to / List presented to:  NA   DME arranged  NA      DME agency  NA     HH arranged  NA      HH agency  NA   Status of service:  In process, will continue to follow Medicare Important Message given?  NA - LOS <3 / Initial given by admissions (If response is "NO", the following Medicare IM given date fields will be blank) Date Medicare IM given:   Date Additional Medicare IM given:    Discharge Disposition:    Per UR Regulation:  Reviewed for med. necessity/level of care/duration of stay  If discussed at Long Length of Stay Meetings, dates discussed:    Comments:  [id number]/Rollen Selders Earlene Plater, RN, BSN, CCM: CHART REVIEWED AND UPDATED. NO DISCHARGE NEEDS PRESENT AT THIS TIME. CASE MANAGEMENT [phone]

## 2012-03-29 NOTE — Progress Notes (Signed)
IP PROGRESS NOTE  Subjective: Her mouth feels better today. No new complaint. She would like to advance her diet.  Objective: Vital signs in last 24 hours: Temp:  [98.8 F (37.1 C)-99.6 F (37.6 C)] 98.9 F (37.2 C) (08/27 0626) Pulse Rate:  [85-89] 89  (08/27 0626) Resp:  [16-18] 16  (08/27 0626) BP: (95-124)/(49-74) 95/74 mmHg (08/27 0626) SpO2:  [88 %-96 %] 90 % (08/27 0626)  Intake/Output from previous day: 08/26 0701 - 08/27 0700 In: 2550 [I.V.:2400; IV Piggyback:150] Out: -  Intake/Output this shift:   HEENT: Dry blood over the lips. No oral ulcers. Lungs: Inspiratory rhonchi at the left greater than right lower chest, no respiratory distress, good air movement bilaterally Cardiac: Regular rate and rhythm Abdomen: Nontender, soft Vascular: No leg edema  Lab Results:   [facility] 03/27/12 1810  WBC 7.1  HGB 13.7  HCT 39.6  PLT 179   BMET  Basename 03/29/12 0552 03/28/12 0605  NA 131* 129*  K 3.3* 4.1  CL 98 96  CO2 25 25  GLUCOSE 114* 100*  BUN 12 8  CREATININE 0.83 0.79  CALCIUM 8.5 8.5   PT/INR 1.9 Studies/Results: Dg Chest 2 View  03/27/2012  *RADIOLOGY REPORT*  Clinical Data: Shortness of breath, weakness and fever.  CHEST - 2 VIEW  Comparison: Plain films of the chest 03/21/2012.  Findings: Since the prior study, the patient has developed a small left pleural effusion.  There is airspace disease on the left, worst in the left upper, lobe most consistent with pneumonia. Right lung appears clear.  Port-A-Cath is in place.  Heart size is normal.  IMPRESSION: Findings most consistent with left upper lobe pneumonia and associated small parapneumonic effusion.   Original Report Authenticated By: Bernadene Bell. Maricela Curet, M.D.    Abd 1 View (kub)  03/27/2012  *RADIOLOGY REPORT*  Clinical Data: Abdominal pain, evaluate for small bowel obstruction  ABDOMEN - 1 VIEW  Comparison: CT abdomen pelvis dated 01/14/2012  Findings: Nonobstructive bowel gas pattern.  2.1 cm  gallstone.  Degenerative changes of the lower lumbar spine.  IMPRESSION: Nonobstructive bowel gas pattern.  2.1 cm gallstone.   Original Report Authenticated By: Charline Bills, M.D.     Medications: Reviewed.  Assessment/Plan:  1. Left upper lobe pneumonia. Day 3 Levaquin. 2. Hospitalization with febrile neutropenia 03/22/2012 through 03/26/2012. Cultures negative. 3. Oral candidiasis. Continue Diflucan. 4. Mucositis. 5. Mouth/throat pain likely due to #3 and #4. Try hydrocodone elixir for pain 6. Non-Hodgkin's lymphoma, large B-cell lymphoma, CD20 positive. Status post cycle 1 CHOP/Rituxan 03/15/2012. 7. COPD. 8. Anticoagulation therapy. Coumadin per pharmacy. 9. Recent pancytopenia secondary to chemotherapy. Counts recovered. 10. Hypokalemia-replete and IV fluid Disposition-her performance status appears improved today. She is afebrile. The plan is to advance her diet as tolerated. Try hydrocodone elixir for the mouth pain. Increase ambulation as tolerated.   LOS: 2 days    [name], Jillyn Hidden, ANP/GNP-BC 03/29/2012 8:22 AM

## 2012-03-29 NOTE — Progress Notes (Signed)
PHARMACIST - PHYSICIAN COMMUNICATION DR:   Truett Perna CONCERNING: Antibiotic IV to Oral Route Change Policy  RECOMMENDATION: This patient is receiving Levaquin by the intravenous route.  Based on criteria approved by the Pharmacy and Therapeutics Committee, the antibiotic(s) is/are being converted to the equivalent oral dose form(s).   DESCRIPTION: These criteria include:  Patient being treated for a respiratory tract infection, urinary tract infection, or cellulitis  The patient is not neutropenic and does not exhibit a GI malabsorption state  The patient is eating (either orally or via tube) and/or has been taking other orally administered medications for a least 24 hours  The patient is improving clinically and has a Tmax < 100.5  If you have questions about this conversion, please contact the Pharmacy Department  []   [phone] )  [facility] []   [phone] )  [facility]  []   [phone] )  [facility] [x]   [phone] )  [facility]    Hessie Knows, PharmD, BCPS Pager [phone] 03/29/2012 8:30 AM

## 2012-03-30 LAB — BASIC METABOLIC PANEL
Chloride: 99 mEq/L (ref 96–112)
GFR calc Af Amer: >90 mL/min (ref >90)
GFR calc non Af Amer: 78 mL/min — ABNORMAL LOW (ref >90)
Glucose, Bld: 96 mg/dL (ref 70–99)
Potassium: 3.7 mEq/L (ref 3.5–5.1)
Sodium: 133 mEq/L — ABNORMAL LOW (ref 135–145)

## 2012-03-30 LAB — PROTIME-INR: Prothrombin Time: 23.8 seconds — ABNORMAL HIGH (ref 11.6–15.2)

## 2012-03-30 MED ORDER — WARFARIN SODIUM 2.5 MG PO TABS
2.5000 mg | ORAL_TABLET | Freq: Once | ORAL | Status: AC
  Administered 2012-03-30: 2.5 mg via ORAL
  Filled 2012-03-30: qty 1

## 2012-03-30 NOTE — Progress Notes (Signed)
ANTICOAGULATION CONSULT NOTE   Pharmacy Consult for Warfarin Indication: atrial fibrillation  Allergies  Allergen Reactions  . Propoxyphene Hcl   . Propoxyphene-Acetaminophen     REACTION: Reaction not known    Patient Measurements: Height: 5\' 4"  (162.6 cm) Weight: 186 lb 15.2 oz (84.8 kg) IBW/kg (Calculated) : 54.7   Vital Signs: Temp: 98.2 F (36.8 C) (08/28 0556) Temp src: Oral (08/28 0556) BP: 122/80 mmHg (08/28 0556) Pulse Rate: 86  (08/28 0556)  Labs:  Basename 03/30/12 0630 03/29/12 0552 03/28/12 0605 03/27/12 1810  HGB -- -- -- 13.7  HCT -- -- -- 39.6  PLT -- -- -- 179  APTT -- -- -- --  LABPROT 23.8* 22.1* 21.1* --  INR 2.09* 1.90* 1.79* --  HEPARINUNFRC -- -- -- --  CREATININE 0.71 0.83 0.79 --  CKTOTAL -- -- -- --  CKMB -- -- -- --  TROPONINI -- -- -- --    Estimated Creatinine Clearance: 57.1 ml/min (by C-G formula based on Cr of 0.71).   Medical History: Past Medical History  Diagnosis Date  . GERD (gastroesophageal reflux disease)   . Arthritis   . Stroke 10/2002    no residual  . History of TIAs   . Diverticulosis   . PUD (peptic ulcer disease)   . PVD (peripheral vascular disease)   . Hyperlipidemia   . Anxiety   . Depression   . 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 Blood transfusion   . COPD (chronic obstructive pulmonary disease)   . Lymphona, mantle cell, inguinal region/lower limb     Assessment:  72 YOF with recent hospitalization here at [facility] 8/20-24. Pt readmitted 8/25 for hydration and reassessment of fever for febrile neutropenia.    Patient is on chronic warfarin PTA for atrial fibrillation, dose PTA 7.5 mg daily but was requiring much smaller doses d/t interaction with Fluconazole for oral thrush during last admission.  INR became supratx 8/22 (5.26)  Patient was discharged 8/24 with instructions to take 5 mg daily and reportedly took 5 mg on 8/24.    Patient continues on  Diflucan and Levaquin likely causing a reduction in warfarin dosing needs to achieve goal INR  INR now therapeutic  Goal of Therapy:  INR 2-3   Plan:  1. Warfarin 2.5mg  today 2. Daily INR 3. If discharged today would recommend 2.5mg  daily initially with close follow up of INR if continued on Diflucan and/or Levaquin   Hessie Knows, PharmD, BCPS Pager [phone] 03/30/2012 8:24 AM

## 2012-03-30 NOTE — Progress Notes (Signed)
IP PROGRESS NOTE  Subjective:  She continues to feel better. She is tolerating a diet.  Objective: Vital signs in last 24 hours: Temp:  [98.2 F (36.8 C)-99.2 F (37.3 C)] 98.2 F (36.8 C) (08/28 0556) Pulse Rate:  [73-90] 87  (08/28 0924) Resp:  [16-18] 18  (08/28 0556) BP: (122-146)/(60-80) 146/69 mmHg (08/28 0924) SpO2:  [84 %-93 %] 87 % (08/28 0929)  Intake/Output from previous day: 08/27 0701 - 08/28 0700 In: 120 [P.O.:120] Out: 1100 [Urine:1000; Stool:100] Intake/Output this shift: Total I/O In: 120 [P.O.:120] Out: 400 [Urine:400] HEENT: Dry blood over the lips. No oral ulcers. No thrush Lungs: Inspiratory rhonchi at the left greater than right lower chest, no respiratory distress, good air movement bilaterally Cardiac: Regular rate and rhythm Abdomen: Nontender, soft Vascular: No leg edema  Lab Results:   [facility] 03/27/12 1810  WBC 7.1  HGB 13.7  HCT 39.6  PLT 179   BMET  Basename 03/30/12 0630 03/29/12 0552  NA 133* 131*  K 3.7 3.3*  CL 99 98  CO2 26 25  GLUCOSE 96 114*  BUN 9 12  CREATININE 0.71 0.83  CALCIUM 8.5 8.5   PT/INR 2.09  Studies/Results: No results found.  Medications: Reviewed.  Assessment/Plan:  1. Left upper lobe pneumonia. Day 4 Levaquin. 2. Hospitalization with febrile neutropenia 03/22/2012 through 03/26/2012. Cultures negative. 3. Oral candidiasis. Resolved. Continue Diflucan. 4. Mucositis secondary to chemotherapy-improved 5. Mouth/throat pain likely due to #3 and #4. Improved 6. Non-Hodgkin's lymphoma, large B-cell lymphoma, CD20 positive. Status post cycle 1 CHOP/Rituxan 03/15/2012. 7. COPD. 8. Anticoagulation therapy. Coumadin per pharmacy. The PT/INR is therapeutic 9. Recent pancytopenia secondary to chemotherapy. Counts recovered. 10. Hypokalemia-replete in IV fluid, normal today Disposition-her performance status continues to improve. The plan is to advance her ambulation today. I anticipate she will be ready for  discharge to home on 03/31/2012.  LOS: 3 days    Kendallyn Lippold, Jillyn Hidden, ANP/GNP-BC 03/30/2012 10:12 AM

## 2012-03-30 NOTE — Progress Notes (Signed)
The Pt had many complaints about the Nursing staff rounding on her every hour last night . She stated to the Nurse " why are you always checking to see if I am still breathing". Informed the Pt of the importance of why we do safety rounds. The pt became very upset about the bed alarm being on for her safety. She stated she going home soon and she gets up by herself then why all this help now. Continued to educate about safety. At times the pt found it difficult and quiet upsetting to call the staff for bathroom and mobility help. Towards the end of the shift the Pt did offer the staff and apology  for some of her actions. Will continue to monitor.

## 2012-03-30 NOTE — Progress Notes (Signed)
Patient was OOb in the chair this am after breakfast.This nurse asked and encouraged her to ambulate, she said she will wait for her husband. Will continue to monitor patient.- Hulda Marin RN

## 2012-03-31 ENCOUNTER — Telehealth: Payer: Self-pay | Admitting: *Deleted

## 2012-03-31 ENCOUNTER — Other Ambulatory Visit: Payer: Self-pay | Admitting: *Deleted

## 2012-03-31 ENCOUNTER — Other Ambulatory Visit: Payer: Self-pay | Admitting: Nurse Practitioner

## 2012-03-31 DIAGNOSIS — C8589 Other specified types of non-Hodgkin lymphoma, extranodal and solid organ sites: Secondary | ICD-10-CM

## 2012-03-31 DIAGNOSIS — J449 Chronic obstructive pulmonary disease, unspecified: Secondary | ICD-10-CM

## 2012-03-31 DIAGNOSIS — B370 Candidal stomatitis: Secondary | ICD-10-CM

## 2012-03-31 LAB — PROTIME-INR: INR: 2.02 — ABNORMAL HIGH (ref 0.00–1.49)

## 2012-03-31 MED ORDER — WARFARIN SODIUM 2.5 MG PO TABS: 2.5000 mg | ORAL_TABLET | Freq: Once | ORAL | Status: DC

## 2012-03-31 MED ORDER — MAGIC MOUTHWASH: 10.0000 mL | ORAL | Status: DC | PRN

## 2012-03-31 MED ORDER — WARFARIN SODIUM 2.5 MG PO TABS
2.5000 mg | ORAL_TABLET | Freq: Once | ORAL | Status: DC
  Filled 2012-03-31: qty 1

## 2012-03-31 MED ORDER — LEVOFLOXACIN 500 MG PO TABS: 500.0000 mg | ORAL_TABLET | Freq: Every day | ORAL | Status: AC

## 2012-03-31 NOTE — Progress Notes (Signed)
Daughter visited around 0600am 03/31/12 to check on patients progress and update on last night. Patients daughter also left a note requesting M.D. Or medical staff to call and give information concerning mothers status and if she is ready for discharge. Her concern is that her"." mother leaves in a 3 story town home and stairs are to much for mom to do." I will follow up with dayshift RN and give her the note.

## 2012-03-31 NOTE — Discharge Summary (Signed)
Physician Discharge Summary  Patient ID: [name] @ATTENDINGNPI @ MRN: [id number] DOB/AGE: 1927/11/15 78 y.o.  Admit date: 03/27/2012 Discharge date: 03/31/2012  Discharge Diagnoses:  1. Pneumonia 2. Non-Hodgkin's, status post cycle 1 of CHOP-Rituxan on 03/15/2012 3. Mucositis secondary to chemotherapy-improved at discharge 4. COPD 5. Fever secondary to pneumonia-resolved 6. Pancytopenia secondary to chemotherapy-resolved 7. Oral candidiasis-resolved   Discharged Condition:  improved   Discharge Labs:  none  Significant Diagnostic Studies: Chest x-ray 03/27/2029  Consults: None  Procedures:  None  Disposition: 01-Home or Self Care   Medication List  As of 03/31/2012  2:08 PM   TAKE these medications         ALPRAZolam 0.5 MG tablet   Commonly known as: XANAX   Take 1 tablet (0.5 mg total) by mouth 3 (three) times daily as needed for sleep.      bisoprolol 5 MG tablet   Commonly known as: ZEBETA   Take 5 mg by mouth daily.      diltiazem 30 MG tablet   Commonly known as: CARDIZEM   Take 1 tablet (30 mg total) by mouth 3 (three) times daily.      levofloxacin 500 MG tablet   Commonly known as: LEVAQUIN   Take 1 tablet (500 mg total) by mouth daily.   Start taking on: 04/01/2012      lidocaine-prilocaine cream   Commonly known as: EMLA   Apply to port-a-cath site 1-2 hours prior to chemo.  Cover with plastic wrap      PRESCRIPTION MEDICATION   Pt gets [facility] at [facility] 1 treatment per 3 weeks. Followed Dr Alcide Evener. Pt's next treatment is on the sep 3rd      prochlorperazine 10 MG tablet   Commonly known as: COMPAZINE   Take 10 mg by mouth every 6 (six) hours as needed. For nausea.      tiotropium 18 MCG inhalation capsule   Commonly known as: SPIRIVA   Place 18 mcg into inhaler and inhale daily.      Vitamin D (Ergocalciferol) 50000 UNITS Caps   Commonly known as: DRISDOL   Take 50,000 Units by mouth every 7 (seven) days. Taken on Sundays.     warfarin 2.5 MG tablet   Commonly known as: COUMADIN   Take 1 tablet (2.5 mg total) by mouth one time only at 6 PM.            Follow-up Information    Follow up with Thornton Papas, MD on 04/05/2012.   Contact information:   [address] [place] [address] [phone]          Hospital Course: She was admitted on 03/22/2012 with fever and neutropenia. The white count had recovered and she appeared well when she was discharged on 03/26/2012. She returned on 03/27/2012 with a high fever and a sore mouth. A chest x-ray on the day of admission confirmed a left lung pneumonia.  She was admitted and placed on Levaquin. The fever resolved during this admission. She was noted to have evidence of oral candidiasis and mucositis. She was treated with Diflucan and narcotic analgesics.  The mucositis and candidiasis resolved. She was eating a normal diet and ambulatory during this admission. The fever resolved. Cultures of the blood remained negative.  [name] appears stable for discharge on the morning of 03/31/2012.     Discharge Orders    Future Appointments: Provider: Department: Dept Phone: Center:   04/05/2012 8:00 AM Dava Najjar Idelle Jo [facility] [phone] None   04/05/2012 8:30 AM Ladene Artist, MD [facility] [phone] None   04/05/2012 9:30 AM [facility] F20 [facility] [phone] None   04/06/2012 8:15 AM [facility] Inj Nurse [facility] [phone] None      Signed: Thornton Papas 03/31/2012, 2:08 PM

## 2012-03-31 NOTE — Telephone Encounter (Signed)
[name] called requesting script for MMW be called to [facility] at [address].  Patient being discharged at th is time.  Will notify providers.

## 2012-03-31 NOTE — Progress Notes (Signed)
ANTICOAGULATION CONSULT NOTE   Pharmacy Consult for Warfarin Indication: atrial fibrillation  Allergies  Allergen Reactions  . Propoxyphene Hcl   . Propoxyphene-Acetaminophen     REACTION: Reaction not known   Patient Measurements: Height: 5\' 4"  (162.6 cm) Weight: 186 lb 15.2 oz (84.8 kg) IBW/kg (Calculated) : 54.7   Vital Signs: Temp: 98.4 F (36.9 C) (08/29 0450) Temp src: Oral (08/29 0450) BP: 158/80 mmHg (08/29 0450) Pulse Rate: 83  (08/29 0450)  Labs:  Basename 03/31/12 0545 03/30/12 0630 03/29/12 0552  HGB -- -- --  HCT -- -- --  PLT -- -- --  APTT -- -- --  LABPROT 23.2* 23.8* 22.1*  INR 2.02* 2.09* 1.90*  HEPARINUNFRC -- -- --  CREATININE -- 0.71 0.83  CKTOTAL -- -- --  CKMB -- -- --  TROPONINI -- -- --    Estimated Creatinine Clearance: 57.1 ml/min (by C-G formula based on Cr of 0.71).   Medical History: Past Medical History  Diagnosis Date  . GERD (gastroesophageal reflux disease)   . Arthritis   . Stroke 10/2002    no residual  . History of TIAs   . Diverticulosis   . PUD (peptic ulcer disease)   . PVD (peripheral vascular disease)   . Hyperlipidemia   . Anxiety   . Depression   . 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 Blood transfusion   . COPD (chronic obstructive pulmonary disease)   . Lymphona, mantle cell, inguinal region/lower limb     Assessment:  55 YOF with recent hospitalization here at [facility] 8/20-24. Pt readmitted 8/25 for hydration and reassessment of fever for febrile neutropenia. Thrush resolving.  Patient is on chronic warfarin PTA for atrial fibrillation, dose PTA 7.5 mg daily but was requiring much smaller doses d/t interaction with Fluconazole for oral thrush during last admission.  INR became supratx 8/22 (5.26)  Patient was discharged 8/24 with instructions to take 5 mg daily and reportedly took 5 mg on 8/24.    Patient continues on Diflucan and Levaquin likely causing a  reduction in warfarin dosing needs to achieve goal INR  INR remains therapeutic  Goal of Therapy:  INR 2-3   Plan:  1. Warfarin 2.5mg  today 2. Daily INR 3. If discharged today would recommend 2.5mg  daily initially with close follow up of INR if continued on Diflucan and/or Levaquin   Otho Bellows, PharmD,  Pager [phone] 03/31/2012 11:24 AM

## 2012-04-01 ENCOUNTER — Telehealth: Payer: Self-pay | Admitting: *Deleted

## 2012-04-01 NOTE — Telephone Encounter (Signed)
Received call from pt husband confused about Coumadin dose wife is suppose to take.  "She was discharge yesterday 03/31/12 and they told her to take 2.5mg  dose once at 6pm today.  She was taking 7.5mg  daily, so we need clarification."  Note given to Dr. Truett Perna and instructed pt would call back with further instructions.  Pt husband verbalized understanding.  Per Dr. Truett Perna, pt is to take 2.5mg  daily and will re-check 04/05/12.  Pt husband verbalized understanding and expressed appreciation for call.

## 2012-04-02 LAB — CULTURE, BLOOD (ROUTINE X 2): Culture: NO GROWTH

## 2012-04-05 ENCOUNTER — Other Ambulatory Visit (HOSPITAL_BASED_OUTPATIENT_CLINIC_OR_DEPARTMENT_OTHER): Payer: Medicare Other | Admitting: Lab

## 2012-04-05 ENCOUNTER — Telehealth: Payer: Self-pay | Admitting: Oncology

## 2012-04-05 ENCOUNTER — Ambulatory Visit (HOSPITAL_BASED_OUTPATIENT_CLINIC_OR_DEPARTMENT_OTHER): Payer: Medicare Other | Admitting: Oncology

## 2012-04-05 ENCOUNTER — Ambulatory Visit: Payer: Medicare Other

## 2012-04-05 VITALS — BP 99/66 | HR 92 | Temp 97.5°F | Resp 18 | Ht 64.0 in | Wt 182.6 lb

## 2012-04-05 DIAGNOSIS — C8510 Unspecified B-cell lymphoma, unspecified site: Secondary | ICD-10-CM

## 2012-04-05 DIAGNOSIS — C8589 Other specified types of non-Hodgkin lymphoma, extranodal and solid organ sites: Secondary | ICD-10-CM

## 2012-04-05 DIAGNOSIS — C8588 Other specified types of non-Hodgkin lymphoma, lymph nodes of multiple sites: Secondary | ICD-10-CM

## 2012-04-05 DIAGNOSIS — I4891 Unspecified atrial fibrillation: Secondary | ICD-10-CM

## 2012-04-05 LAB — CBC WITH DIFFERENTIAL/PLATELET
BASO%: 1 % (ref 0.0–2.0)
MCHC: 32.4 g/dL (ref 31.5–36.0)
MONO#: 1.3 10*3/uL — ABNORMAL HIGH (ref 0.1–0.9)
NEUT#: 6.9 10*3/uL — ABNORMAL HIGH (ref 1.5–6.5)
RBC: 4.43 10*6/uL (ref 3.70–5.45)
WBC: 9.4 10*3/uL (ref 3.9–10.3)
lymph#: 0.9 10*3/uL (ref 0.9–3.3)
nRBC: 0 % (ref 0–0)

## 2012-04-05 LAB — PROTIME-INR
INR: 1.5 — ABNORMAL LOW (ref 2.00–3.50)
Protime: 18 Seconds — ABNORMAL HIGH (ref 10.6–13.4)

## 2012-04-05 NOTE — Progress Notes (Signed)
   [facility]    OFFICE PROGRESS NOTE   INTERVAL HISTORY:   She was discharged from the hospital on 03/31/2012 after an admission with pneumonia. She completed antibiotic therapy with Levaquin yesterday. No recurrent fever. The mouth soreness has resolved. She continues to have malaise.  Objective:  Vital signs in last 24 hours:  Blood pressure 99/66, pulse 92, temperature 97.5 F (36.4 C), temperature source Oral, resp. rate 18, height 5\' 4"  (1.626 m), weight 182 lb 9.6 oz (82.827 kg).    HEENT: No thrush or ulcers Resp: Lungs clear bilaterally Cardio: Regular rate and rhythm GI: No hepatosplenomegaly, nontender Vascular: No leg edema   Portacath/PICC-without erythema  Lab Results:  Lab Results  Component Value Date   WBC 9.4 04/05/2012   HGB 12.0 04/05/2012   HCT 37.0 04/05/2012   MCV 83.5 04/05/2012   PLT 368 04/05/2012   ANC 6.9 PT/INR 1.5   Medications: I have reviewed the patient's current medications.  Assessment/Plan: 1. Non-Hodgkin's lymphoma, large B-cell lymphoma, CD20 positive involving 8 needle core biopsy of a right iliac lymph node 02/12/2012. CT of the abdomen and pelvis on 01/15/2012 showed abdominal/pelvic lymphadenopathy. Serum LDH in normal range on 02/25/2012. Staging PET scan 02/25/2012 with a small lymph node posteriolateral to the oropharynx on the left measuring 9 x 7 mm with SUV 5.4; large cluster of hypermetabolic lymph nodes in the right hilum with SUV 4.3; cluster of mildly enlarged hepatoduodenal ligament lymph nodes and mildly enlarged portacaval lymph node with hypermetabolic activity (SUV max 37.1); numerous borderline enlarged and mildly enlarged periaortic retroperitoneal lymph nodes with extensive hypermetabolic activity (SUV max 43.6-47.6); numerous borderline enlarged and mildly enlarged hypermetabolic lymph nodes along the pelvic sidewall bilaterally most pronounced on the right with a right external iliac lymph node measuring  2.9 x 4.1 cm (SUV max 52.5). Bone marrow aspiration/biopsy 03/09/2012 negative for involvement with lymphoma.  -Status post cycle 1 of CHOP-Rituxan on 03/15/2012 2. History of Night sweats likely secondary to #1. 3. Rectal bleeding June 2013 status post negative colonoscopy 01/28/2012 and upper endoscopy 02/11/2012. The bleeding was likely related to hemorrhoids. 4. COPD. 5. Remote history of larynx cancer. 6. Status post Port-A-Cath placement 03/09/2012. 7. 2-D echo 03/11/2012 with left ventricular ejection fraction 60-65%. 8. Admission with febrile neutropenia 03/22/2012 with no source for infection identified 9. Admission with fever and a chest x-ray consistent with "pneumonia "on 03/27/2012-status post a course of Levaquin 10. Pancytopenia following cycle 1 of CHOP-rituximab-resolved 11. History of atrial fibrillation-maintained on Coumadin   Disposition:  She appears to be recovering from the recent admission with febrile neutropenia and pneumonia. We decided to delay the next cycle of CHOP-rituximab for one week. The chemotherapy will be dose reduced with cycle 2. She will return for an office visit and cycle 2 on 04/12/2012. The Coumadin dose was adjusted today.   Thornton Papas, MD  04/05/2012  9:12 AM

## 2012-04-05 NOTE — Telephone Encounter (Signed)
called and l/m with 9/10 9/11 9/12 appt info   aOM

## 2012-04-06 ENCOUNTER — Ambulatory Visit: Payer: Medicare Other

## 2012-04-07 ENCOUNTER — Encounter: Payer: Self-pay | Admitting: Family Medicine

## 2012-04-07 ENCOUNTER — Telehealth: Payer: Self-pay | Admitting: *Deleted

## 2012-04-07 ENCOUNTER — Ambulatory Visit (INDEPENDENT_AMBULATORY_CARE_PROVIDER_SITE_OTHER): Payer: Medicare Other | Admitting: Family Medicine

## 2012-04-07 VITALS — BP 90/58 | Temp 98.2°F | Wt 183.0 lb

## 2012-04-07 DIAGNOSIS — I951 Orthostatic hypotension: Secondary | ICD-10-CM

## 2012-04-07 NOTE — Progress Notes (Signed)
  Subjective:    Patient ID: [name], female    DOB: 09-May-1928, 78 y.o.   MRN: [id number]  HPI  Patient seen with dizziness. She has history of multiple chronic problems including recently diagnosed B-cell lymphoma, past history of atrial fibrillation, hypertension, hyperlipidemia, COPD. She's had some recent problems orthostasis. Recent hemoglobin 12.0. Hydrating fairly well. No nausea or vomiting. She has had poor appetite and has lost about 16 pounds over the past month. They've gotten occasional low readings at home of 80 systolic. She takes diltiazem 30 mg 3 times a day and bisoprolol 5 mg daily. No recent chest pains. No dyspnea.   Review of Systems  Constitutional: Positive for appetite change, fatigue and unexpected weight change. Negative for fever and chills.  Cardiovascular: Negative for chest pain, palpitations and leg swelling.  Gastrointestinal: Negative for abdominal pain.  Genitourinary: Negative for dysuria.  Neurological: Positive for dizziness and weakness. Negative for syncope.       Objective:   Physical Exam  Constitutional: She is oriented to person, place, and time. She appears well-developed and well-nourished.  HENT:  Mouth/Throat: Oropharynx is clear and moist.  Neck: Neck supple.  Cardiovascular: Normal rate and regular rhythm.   Pulmonary/Chest: Effort normal and breath sounds normal. No respiratory distress. She has no wheezes. She has no rales.  Musculoskeletal: She exhibits no edema.  Neurological: She is alert and oriented to person, place, and time. No cranial nerve deficit.          Assessment & Plan:  Orthostasis. Change positions slowly. Hold diltiazem for now. Continue bisoprolol.  Increase hydration. Systolic drop 118 sitting to 90 standing. Suspect BP dropping related to recent weight loss.

## 2012-04-07 NOTE — Telephone Encounter (Signed)
Late entry : Message from pt's husband reporting low BP.  Attempted to return call several times no answer.

## 2012-04-07 NOTE — Patient Instructions (Addendum)
Drink lots of fluids Change positions slowly Hold Diltiazem for now

## 2012-04-08 ENCOUNTER — Telehealth: Payer: Self-pay | Admitting: Internal Medicine

## 2012-04-08 NOTE — Telephone Encounter (Signed)
Pt called and said that her bp meds have been changed by Dr Truett Perna. Pt req to get in with Dr Lovell Sheehan asap. Pt is going through chemo. Pt refuses to see another doctor.

## 2012-04-11 ENCOUNTER — Encounter: Payer: Self-pay | Admitting: Dietician

## 2012-04-11 NOTE — Progress Notes (Signed)
Brief Out-patient Oncology Nutrition Note  Reason: Patient Screened Positive For Nutrition Risk For Unintentional Weight Loss and Decreased Appetite.   [name] is an 78 year old female patient of Dr. Truett Perna, diagnosed with lymphoma. Attempted to contact patient via telephone for positive nutrition risk. Left patient voicemail with RD contact information.   Wt Readings from Last 10 Encounters:  04/07/12 183 lb (83.008 kg)  04/05/12 182 lb 9.6 oz (82.827 kg)  03/27/12 186 lb 15.2 oz (84.8 kg)  03/22/12 192 lb (87.091 kg)  03/22/12 192 lb 6.4 oz (87.272 kg)  03/14/12 196 lb 1.6 oz (88.95 kg)  03/14/12 194 lb (87.998 kg)  03/03/12 196 lb 8 oz (89.132 kg)  02/23/12 197 lb 9.6 oz (89.631 kg)  02/17/12 198 lb (89.812 kg)    RD available for nutrition needs.   Iven Finn, MS, RD, LDN [phone]

## 2012-04-11 NOTE — Telephone Encounter (Signed)
Talked with pt and she saw dr burchette last week and bp low, maybe from chemo and weight loss- per dr Lovell Sheehan- stay off diltiazem and continue with zebeta, but monitor bp and let dr Lovell Sheehan know if pt bp becomes very low and she becomes symptomatic - he may need to adjust bp med--pt informed

## 2012-04-12 ENCOUNTER — Other Ambulatory Visit (HOSPITAL_BASED_OUTPATIENT_CLINIC_OR_DEPARTMENT_OTHER): Payer: Medicare Other | Admitting: Lab

## 2012-04-12 ENCOUNTER — Other Ambulatory Visit: Payer: Self-pay | Admitting: Oncology

## 2012-04-12 ENCOUNTER — Telehealth: Payer: Self-pay | Admitting: *Deleted

## 2012-04-12 ENCOUNTER — Ambulatory Visit: Payer: Medicare Other

## 2012-04-12 ENCOUNTER — Ambulatory Visit (HOSPITAL_BASED_OUTPATIENT_CLINIC_OR_DEPARTMENT_OTHER): Payer: Medicare Other | Admitting: Nurse Practitioner

## 2012-04-12 ENCOUNTER — Telehealth: Payer: Self-pay | Admitting: Oncology

## 2012-04-12 VITALS — BP 149/84 | HR 76 | Temp 98.4°F | Resp 18 | Ht 64.0 in | Wt 181.1 lb

## 2012-04-12 DIAGNOSIS — I4891 Unspecified atrial fibrillation: Secondary | ICD-10-CM

## 2012-04-12 DIAGNOSIS — C8510 Unspecified B-cell lymphoma, unspecified site: Secondary | ICD-10-CM

## 2012-04-12 DIAGNOSIS — C8588 Other specified types of non-Hodgkin lymphoma, lymph nodes of multiple sites: Secondary | ICD-10-CM

## 2012-04-12 DIAGNOSIS — Z7901 Long term (current) use of anticoagulants: Secondary | ICD-10-CM

## 2012-04-12 DIAGNOSIS — R209 Unspecified disturbances of skin sensation: Secondary | ICD-10-CM

## 2012-04-12 DIAGNOSIS — C8589 Other specified types of non-Hodgkin lymphoma, extranodal and solid organ sites: Secondary | ICD-10-CM

## 2012-04-12 LAB — COMPREHENSIVE METABOLIC PANEL (CC13)
ALT: 28 U/L (ref 0–55)
AST: 25 U/L (ref 5–34)
Calcium: 9.8 mg/dL (ref 8.4–10.4)
Chloride: 102 mEq/L (ref 98–107)
Creatinine: 1.1 mg/dL (ref 0.6–1.1)
Potassium: 4.6 mEq/L (ref 3.5–5.1)
Sodium: 138 mEq/L (ref 136–145)

## 2012-04-12 LAB — CBC WITH DIFFERENTIAL/PLATELET
BASO%: 1.3 % (ref 0.0–2.0)
EOS%: 2.5 % (ref 0.0–7.0)
MCH: 28 pg (ref 25.1–34.0)
MCHC: 33 g/dL (ref 31.5–36.0)
NEUT%: 77.1 % — ABNORMAL HIGH (ref 38.4–76.8)
RBC: 4.25 10*6/uL (ref 3.70–5.45)
RDW: 15.3 % — ABNORMAL HIGH (ref 11.2–14.5)
lymph#: 0.7 10*3/uL — ABNORMAL LOW (ref 0.9–3.3)

## 2012-04-12 NOTE — Progress Notes (Signed)
OFFICE PROGRESS NOTE  Interval history:  [name] returns as scheduled. She is feeling better. Appetite is beginning to improve. Mouth sores have resolved. Cough continues to improve. No fever. Bowels moving regularly. She has noted consistent numbness/tingling in the fingertips over the past 48 hours.   Objective: Blood pressure 149/84, pulse 76, temperature 98.4 F (36.9 C), temperature source Oral, resp. rate 18, height 5\' 4"  (1.626 m), weight 181 lb 1.6 oz (82.146 kg).  Oropharynx is without thrush or ulceration. Lungs are clear. No wheezes or rales. Regular cardiac rhythm. Port-A-Cath site is without erythema. Abdomen is soft and nontender. No organomegaly. Extremities are without edema. Vibratory sense is mildly to moderately decreased over the fingertips per tuning fork exam.  Lab Results: Lab Results  Component Value Date   WBC 7.3 04/12/2012   HGB 11.9 04/12/2012   HCT 36.1 04/12/2012   MCV 84.8 04/12/2012   PLT 332 04/12/2012    Chemistry:    Chemistry      Component Value Date/Time   NA 138 04/12/2012 1101   NA 133* 03/30/2012 0630   K 4.6 04/12/2012 1101   K 3.7 03/30/2012 0630   CL 102 04/12/2012 1101   CL 99 03/30/2012 0630   CO2 26 04/12/2012 1101   CO2 26 03/30/2012 0630   BUN 14.0 04/12/2012 1101   BUN 9 03/30/2012 0630   CREATININE 1.1 04/12/2012 1101   CREATININE 0.71 03/30/2012 0630      Component Value Date/Time   CALCIUM 9.8 04/12/2012 1101   CALCIUM 8.5 03/30/2012 0630   ALKPHOS 89 04/12/2012 1101   ALKPHOS 80 03/27/2012 1810   AST 25 04/12/2012 1101   AST 24 03/27/2012 1810   ALT 28 04/12/2012 1101   ALT 27 03/27/2012 1810   BILITOT 0.40 04/12/2012 1101   BILITOT 0.6 03/27/2012 1810       Studies/Results: Dg Chest 2 View  03/27/2012  *RADIOLOGY REPORT*  Clinical Data: Shortness of breath, weakness and fever.  CHEST - 2 VIEW  Comparison: Plain films of the chest 03/21/2012.  Findings: Since the prior study, the patient has developed a small left pleural  effusion.  There is airspace disease on the left, worst in the left upper, lobe most consistent with pneumonia. Right lung appears clear.  Port-A-Cath is in place.  Heart size is normal.  IMPRESSION: Findings most consistent with left upper lobe pneumonia and associated small parapneumonic effusion.   Original Report Authenticated By: Bernadene Bell. [name], M.D.    Dg Chest 2 View  03/21/2012  *RADIOLOGY REPORT*  Clinical Data: Short of breath.  Difficulty swallowing.  Chest soreness.  History of lymphoma and laryngeal cancer.  CHEST - 2 VIEW  Comparison: Chest radiography 12/22/2009.  PET scan 02/25/2012.  Findings: Power port is in place with its tip in the SVC 2 cm above the right atrium.  Heart size is normal.  Mediastinal shadows show unfolding of the aorta.  There is mild right hilar prominence compared to the left.  The lungs are clear except for mild bronchial thickening.  No effusions.  No significant bony findings.  IMPRESSION: Mild bronchial thickening. Mild prominence of the right hilar shadows compared to the left.  No other active process.   Original Report Authenticated By: Thomasenia Sales, M.D. ( 03/21/2012 09:01:14 )    Abd 1 View (kub)  03/27/2012  *RADIOLOGY REPORT*  Clinical Data: Abdominal pain, evaluate for small bowel obstruction  ABDOMEN - 1 VIEW  Comparison: CT abdomen pelvis dated  01/14/2012  Findings: Nonobstructive bowel gas pattern.  2.1 cm gallstone.  Degenerative changes of the lower lumbar spine.  IMPRESSION: Nonobstructive bowel gas pattern.  2.1 cm gallstone.   Original Report Authenticated By: Charline Bills, M.D.     Medications: I have reviewed the patient's current medications.  Assessment/Plan:  1. Non-Hodgkin's lymphoma, large B-cell lymphoma, CD20 positive involving 8 needle core biopsy of a right iliac lymph node 02/12/2012. CT of the abdomen and pelvis on 01/15/2012 showed abdominal/pelvic lymphadenopathy. Serum LDH in normal range on 02/25/2012. Staging PET  scan 02/25/2012 with a small lymph node posteriolateral to the oropharynx on the left measuring 9 x 7 mm with SUV 5.4; large cluster of hypermetabolic lymph nodes in the right hilum with SUV 4.3; cluster of mildly enlarged hepatoduodenal ligament lymph nodes and mildly enlarged portacaval lymph node with hypermetabolic activity (SUV max 37.1); numerous borderline enlarged and mildly enlarged periaortic retroperitoneal lymph nodes with extensive hypermetabolic activity (SUV max 43.6-47.6); numerous borderline enlarged and mildly enlarged hypermetabolic lymph nodes along the pelvic sidewall bilaterally most pronounced on the right with a right external iliac lymph node measuring 2.9 x 4.1 cm (SUV max 52.5). Bone marrow aspiration/biopsy 03/09/2012 negative for involvement with lymphoma. Status post cycle 1 of CHOP-Rituxan on 03/15/2012 2. History of Night sweats likely secondary to #1. 3. Rectal bleeding June 2013 status post negative colonoscopy 01/28/2012 and upper endoscopy 02/11/2012. The bleeding was likely related to hemorrhoids. 4. COPD. 5. Remote history of larynx cancer. 6. Status post Port-A-Cath placement 03/09/2012. 7. 2-D echo 03/11/2012 with left ventricular ejection fraction 60-65%. 8. Admission with febrile neutropenia 03/22/2012 with no source for infection identified 9. Admission with fever and a chest x-ray consistent with "pneumonia "on 03/27/2012. She completed a course of Levaquin. 10. Pancytopenia following cycle 1 of CHOP-rituximab-resolved. 11. History of atrial fibrillation-maintained on Coumadin. She will continue Coumadin at the current dose of 5 mg daily. 12. Numbness/tingling in the fingertips present consistently over the past 48 hours. Question vincristine neuropathy. The vincristine will be dose reduced by 50% with cycle 2.  Disposition-[name] appears stable. Plan to proceed with cycle 2 CHOP/Rituxan 04/13/2012 as scheduled with Neulasta support. The chemotherapy will  be dose reduced with cycle 2. She will return for a nadir CBC on 04/22/2012. She will return for a followup visit and cycle 3 CHOP/Rituxan on 05/04/2012. She will contact the office in the interim with any problems.  Plan reviewed with Dr. Truett Perna.  Lonna Cobb ANP/GNP-BC

## 2012-04-12 NOTE — Telephone Encounter (Signed)
appts made and printed for pt ,email to mw to add txc and pt aware

## 2012-04-12 NOTE — Telephone Encounter (Signed)
Per staff message and POF I have scheduled appt.  JMW  

## 2012-04-13 ENCOUNTER — Ambulatory Visit (HOSPITAL_BASED_OUTPATIENT_CLINIC_OR_DEPARTMENT_OTHER): Payer: Medicare Other

## 2012-04-13 VITALS — BP 89/48 | HR 100 | Temp 97.0°F | Resp 18

## 2012-04-13 DIAGNOSIS — C8589 Other specified types of non-Hodgkin lymphoma, extranodal and solid organ sites: Secondary | ICD-10-CM

## 2012-04-13 DIAGNOSIS — Z5111 Encounter for antineoplastic chemotherapy: Secondary | ICD-10-CM

## 2012-04-13 DIAGNOSIS — C8510 Unspecified B-cell lymphoma, unspecified site: Secondary | ICD-10-CM

## 2012-04-13 MED ORDER — ACETAMINOPHEN 325 MG PO TABS
650.0000 mg | ORAL_TABLET | Freq: Once | ORAL | Status: AC
  Administered 2012-04-13: 650 mg via ORAL

## 2012-04-13 MED ORDER — SODIUM CHLORIDE 0.9 % IV SOLN
600.0000 mg/m2 | Freq: Once | INTRAVENOUS | Status: AC
  Administered 2012-04-13: 1200 mg via INTRAVENOUS
  Filled 2012-04-13: qty 60

## 2012-04-13 MED ORDER — SODIUM CHLORIDE 0.9 % IV SOLN
700.0000 mg | Freq: Once | INTRAVENOUS | Status: AC
  Administered 2012-04-13: 700 mg via INTRAVENOUS
  Filled 2012-04-13: qty 70

## 2012-04-13 MED ORDER — VINCRISTINE SULFATE CHEMO INJECTION 1 MG/ML
1.0000 mg | Freq: Once | INTRAVENOUS | Status: AC
  Administered 2012-04-13: 1 mg via INTRAVENOUS
  Filled 2012-04-13: qty 1

## 2012-04-13 MED ORDER — SODIUM CHLORIDE 0.9 % IJ SOLN
10.0000 mL | INTRAMUSCULAR | Status: DC | PRN
  Administered 2012-04-13: 10 mL
  Filled 2012-04-13: qty 10

## 2012-04-13 MED ORDER — DIPHENHYDRAMINE HCL 25 MG PO CAPS
50.0000 mg | ORAL_CAPSULE | Freq: Once | ORAL | Status: AC
  Administered 2012-04-13: 50 mg via ORAL

## 2012-04-13 MED ORDER — ONDANSETRON 16 MG/50ML IVPB (CHCC)
16.0000 mg | Freq: Once | INTRAVENOUS | Status: AC
  Administered 2012-04-13: 16 mg via INTRAVENOUS

## 2012-04-13 MED ORDER — DOXORUBICIN HCL CHEMO IV INJECTION 2 MG/ML
40.0000 mg/m2 | Freq: Once | INTRAVENOUS | Status: AC
  Administered 2012-04-13: 80 mg via INTRAVENOUS
  Filled 2012-04-13: qty 40

## 2012-04-13 MED ORDER — HEPARIN SOD (PORK) LOCK FLUSH 100 UNIT/ML IV SOLN
500.0000 [IU] | Freq: Once | INTRAVENOUS | Status: AC | PRN
  Administered 2012-04-13: 500 [IU]
  Filled 2012-04-13: qty 5

## 2012-04-13 MED ORDER — SODIUM CHLORIDE 0.9 % IV SOLN
Freq: Once | INTRAVENOUS | Status: AC
  Administered 2012-04-13: 10:00:00 via INTRAVENOUS

## 2012-04-13 NOTE — Patient Instructions (Signed)
[facility] Discharge Instructions for Patients Receiving Chemotherapy  Today you received the following chemotherapy agents:  Rituxan, cytoxan, adriamycin, vincristine, prednisone  To help prevent nausea and vomiting after your treatment, we encourage you to take your nausea medication     If you develop nausea and vomiting that is not controlled by your nausea medication, call the clinic. If it is after clinic hours your family physician or the after hours number for the clinic or go to the Emergency Department.   BELOW ARE SYMPTOMS THAT SHOULD BE REPORTED IMMEDIATELY:  *FEVER GREATER THAN 100.5 F  *CHILLS WITH OR WITHOUT FEVER  NAUSEA AND VOMITING THAT IS NOT CONTROLLED WITH YOUR NAUSEA MEDICATION  *UNUSUAL SHORTNESS OF BREATH  *UNUSUAL BRUISING OR BLEEDING  TENDERNESS IN MOUTH AND THROAT WITH OR WITHOUT PRESENCE OF ULCERS  *URINARY PROBLEMS  *BOWEL PROBLEMS  UNUSUAL RASH Items with * indicate a potential emergency and should be followed up as soon as possible.   Please let the nurse know about any problems that you may have experienced. Feel free to call the clinic you have any questions or concerns. The clinic phone number is [phone].   I have been informed and understand all the instructions given to me. I know to contact the clinic, my physician, or go to the Emergency Department if any problems should occur. I do not have any questions at this time, but understand that I may call the clinic during office hours   should I have any questions or need assistance in obtaining follow up care.    __________________________________________  _____________  __________ Signature of Patient or Authorized Representative            Date                   Time    __________________________________________ Nurse's Signature

## 2012-04-14 ENCOUNTER — Ambulatory Visit (HOSPITAL_BASED_OUTPATIENT_CLINIC_OR_DEPARTMENT_OTHER): Payer: Medicare Other

## 2012-04-14 ENCOUNTER — Telehealth: Payer: Self-pay | Admitting: *Deleted

## 2012-04-14 VITALS — BP 119/61 | HR 89 | Temp 98.5°F

## 2012-04-14 DIAGNOSIS — Z5189 Encounter for other specified aftercare: Secondary | ICD-10-CM

## 2012-04-14 DIAGNOSIS — C8589 Other specified types of non-Hodgkin lymphoma, extranodal and solid organ sites: Secondary | ICD-10-CM

## 2012-04-14 DIAGNOSIS — C8510 Unspecified B-cell lymphoma, unspecified site: Secondary | ICD-10-CM

## 2012-04-14 MED ORDER — PEGFILGRASTIM INJECTION 6 MG/0.6ML
6.0000 mg | Freq: Once | SUBCUTANEOUS | Status: AC
  Administered 2012-04-14: 6 mg via SUBCUTANEOUS
  Filled 2012-04-14: qty 0.6

## 2012-04-14 NOTE — Telephone Encounter (Signed)
Returned call to pt husband; wanting to inform office that pt has had "slight bleeding" with stool.  Stated she did "have a little diarrhea and noticed blood"  Per Dr. Truett Perna, pt and husband informed that blood probably d/t hemorrhoids and to watch for now; can use OTC Anusol to reduce inflammation.  Pt husband verbalized understanding and expressed appreciation for call.

## 2012-04-20 ENCOUNTER — Telehealth: Payer: Self-pay | Admitting: *Deleted

## 2012-04-20 NOTE — Telephone Encounter (Signed)
Husband called to report her voice has become a little hoarse, but is better than yesterday. Voice always has been "raspy" due to H/O throat cancer. Throat is not sore; no fever, or swollen nodes. Last chemo 04/13/12. Scheduled for labs on 04/22/12. Instructed husband to push fluids, call for fever and keep lab appointment as scheduled.

## 2012-04-22 ENCOUNTER — Telehealth: Payer: Self-pay | Admitting: *Deleted

## 2012-04-22 ENCOUNTER — Other Ambulatory Visit (HOSPITAL_BASED_OUTPATIENT_CLINIC_OR_DEPARTMENT_OTHER): Payer: Medicare Other | Admitting: Lab

## 2012-04-22 DIAGNOSIS — I4891 Unspecified atrial fibrillation: Secondary | ICD-10-CM

## 2012-04-22 DIAGNOSIS — C8589 Other specified types of non-Hodgkin lymphoma, extranodal and solid organ sites: Secondary | ICD-10-CM

## 2012-04-22 DIAGNOSIS — C8510 Unspecified B-cell lymphoma, unspecified site: Secondary | ICD-10-CM

## 2012-04-22 LAB — PROTIME-INR: Protime: 24 Seconds — ABNORMAL HIGH (ref 10.6–13.4)

## 2012-04-22 LAB — CBC WITH DIFFERENTIAL/PLATELET
BASO%: 0.5 % (ref 0.0–2.0)
Eosinophils Absolute: 0.2 10*3/uL (ref 0.0–0.5)
MONO#: 0.3 10*3/uL (ref 0.1–0.9)
NEUT#: 2.6 10*3/uL (ref 1.5–6.5)
RBC: 3.78 10*6/uL (ref 3.70–5.45)
RDW: 15.3 % — ABNORMAL HIGH (ref 11.2–14.5)
WBC: 3.7 10*3/uL — ABNORMAL LOW (ref 3.9–10.3)
nRBC: 0 % (ref 0–0)

## 2012-04-22 NOTE — Telephone Encounter (Signed)
CBC reviewed by Dr. Truett Perna: Order received to repeat CBC on 04/26/12. Called pt with results. She understands to call office for bleeding and to expect call from schedulers with appt for lab.

## 2012-04-23 ENCOUNTER — Telehealth: Payer: Self-pay | Admitting: Oncology

## 2012-04-23 NOTE — Telephone Encounter (Signed)
lmonvm adviisng the pt of her lab appt on 04/26/2012@10 :15am

## 2012-04-26 ENCOUNTER — Other Ambulatory Visit (HOSPITAL_BASED_OUTPATIENT_CLINIC_OR_DEPARTMENT_OTHER): Payer: Medicare Other

## 2012-04-26 DIAGNOSIS — C8589 Other specified types of non-Hodgkin lymphoma, extranodal and solid organ sites: Secondary | ICD-10-CM

## 2012-04-26 LAB — CBC WITH DIFFERENTIAL/PLATELET
BASO%: 0.7 % (ref 0.0–2.0)
EOS%: 0.9 % (ref 0.0–7.0)
LYMPH%: 8.3 % — ABNORMAL LOW (ref 14.0–49.7)
MCH: 28.5 pg (ref 25.1–34.0)
MCHC: 33.2 g/dL (ref 31.5–36.0)
MONO#: 0.4 10*3/uL (ref 0.1–0.9)
Platelets: 149 10*3/uL (ref 145–400)
RBC: 3.82 10*6/uL (ref 3.70–5.45)
WBC: 5.8 10*3/uL (ref 3.9–10.3)
lymph#: 0.5 10*3/uL — ABNORMAL LOW (ref 0.9–3.3)

## 2012-04-28 ENCOUNTER — Telehealth: Payer: Self-pay | Admitting: *Deleted

## 2012-04-28 NOTE — Telephone Encounter (Signed)
Husband called concerned that injection appointment was not scheduled for 05/05/12. Left VM that appointment would have been scheduled on 10/2 when she is seen by MD. Appointments are only scheduled from MD visit to next MD visit. Will put in order now for the injection appointment to be scheduled since he was concerned about it, but please be sure it will be scheduled in the future.

## 2012-04-29 ENCOUNTER — Telehealth: Payer: Self-pay | Admitting: *Deleted

## 2012-04-29 NOTE — Telephone Encounter (Signed)
05-05-2012 add on injection per orders from 04-28-2012

## 2012-05-02 ENCOUNTER — Encounter: Payer: Self-pay | Admitting: Internal Medicine

## 2012-05-02 NOTE — Progress Notes (Signed)
  Subjective:    Patient ID: [name], female    DOB: [birth year], 78 y.o.   MRN: [id number]  HPI Patient canceled this appointment this chart was opened in error   Review of Systems     Objective:   Physical Exam        Assessment & Plan:  This chart was opened in error

## 2012-05-03 ENCOUNTER — Other Ambulatory Visit: Payer: Self-pay | Admitting: Oncology

## 2012-05-04 ENCOUNTER — Ambulatory Visit (HOSPITAL_BASED_OUTPATIENT_CLINIC_OR_DEPARTMENT_OTHER): Payer: Medicare Other | Admitting: Oncology

## 2012-05-04 ENCOUNTER — Telehealth: Payer: Self-pay | Admitting: Oncology

## 2012-05-04 ENCOUNTER — Other Ambulatory Visit (HOSPITAL_BASED_OUTPATIENT_CLINIC_OR_DEPARTMENT_OTHER): Payer: Medicare Other | Admitting: Lab

## 2012-05-04 ENCOUNTER — Ambulatory Visit (HOSPITAL_BASED_OUTPATIENT_CLINIC_OR_DEPARTMENT_OTHER): Payer: Medicare Other

## 2012-05-04 VITALS — BP 99/61 | HR 93 | Temp 96.9°F | Resp 20 | Ht 64.0 in | Wt 184.9 lb

## 2012-05-04 VITALS — BP 129/74 | HR 90 | Temp 98.1°F | Resp 18

## 2012-05-04 DIAGNOSIS — D849 Immunodeficiency, unspecified: Secondary | ICD-10-CM

## 2012-05-04 DIAGNOSIS — C8510 Unspecified B-cell lymphoma, unspecified site: Secondary | ICD-10-CM

## 2012-05-04 DIAGNOSIS — C8588 Other specified types of non-Hodgkin lymphoma, lymph nodes of multiple sites: Secondary | ICD-10-CM

## 2012-05-04 DIAGNOSIS — Z5111 Encounter for antineoplastic chemotherapy: Secondary | ICD-10-CM

## 2012-05-04 DIAGNOSIS — G130 Paraneoplastic neuromyopathy and neuropathy: Secondary | ICD-10-CM

## 2012-05-04 DIAGNOSIS — I4891 Unspecified atrial fibrillation: Secondary | ICD-10-CM

## 2012-05-04 LAB — COMPREHENSIVE METABOLIC PANEL (CC13)
ALT: 33 U/L (ref 0–55)
AST: 31 U/L (ref 5–34)
Albumin: 3.2 g/dL — ABNORMAL LOW (ref 3.5–5.0)
Alkaline Phosphatase: 77 U/L (ref 40–150)
BUN: 11 mg/dL (ref 7.0–26.0)
Calcium: 9.5 mg/dL (ref 8.4–10.4)
Chloride: 108 mEq/L — ABNORMAL HIGH (ref 98–107)
Potassium: 4.1 mEq/L (ref 3.5–5.1)
Sodium: 140 mEq/L (ref 136–145)
Total Protein: 6.7 g/dL (ref 6.4–8.3)

## 2012-05-04 LAB — CBC WITH DIFFERENTIAL/PLATELET
BASO%: 1.2 % (ref 0.0–2.0)
EOS%: 1 % (ref 0.0–7.0)
HCT: 35.8 % (ref 34.8–46.6)
MCH: 27.7 pg (ref 25.1–34.0)
MCHC: 31.6 g/dL (ref 31.5–36.0)
MONO#: 0.8 10*3/uL (ref 0.1–0.9)
NEUT%: 72.4 % (ref 38.4–76.8)
RDW: 18.4 % — ABNORMAL HIGH (ref 11.2–14.5)
WBC: 5.2 10*3/uL (ref 3.9–10.3)
lymph#: 0.6 10*3/uL — ABNORMAL LOW (ref 0.9–3.3)
nRBC: 0 % (ref 0–0)

## 2012-05-04 MED ORDER — DIPHENHYDRAMINE HCL 25 MG PO CAPS
50.0000 mg | ORAL_CAPSULE | Freq: Once | ORAL | Status: AC
  Administered 2012-05-04: 50 mg via ORAL

## 2012-05-04 MED ORDER — SODIUM CHLORIDE 0.9 % IV SOLN: Freq: Once | INTRAVENOUS | Status: DC

## 2012-05-04 MED ORDER — DOXORUBICIN HCL CHEMO IV INJECTION 2 MG/ML
40.0000 mg/m2 | Freq: Once | INTRAVENOUS | Status: AC
  Administered 2012-05-04: 80 mg via INTRAVENOUS
  Filled 2012-05-04: qty 40

## 2012-05-04 MED ORDER — SODIUM CHLORIDE 0.9 % IV SOLN
600.0000 mg/m2 | Freq: Once | INTRAVENOUS | Status: AC
  Administered 2012-05-04: 1200 mg via INTRAVENOUS
  Filled 2012-05-04: qty 60

## 2012-05-04 MED ORDER — SODIUM CHLORIDE 0.9 % IV SOLN
375.0000 mg/m2 | Freq: Once | INTRAVENOUS | Status: AC
  Administered 2012-05-04: 800 mg via INTRAVENOUS
  Filled 2012-05-04: qty 80

## 2012-05-04 MED ORDER — ONDANSETRON 16 MG/50ML IVPB (CHCC)
16.0000 mg | Freq: Once | INTRAVENOUS | Status: AC
  Administered 2012-05-04: 16 mg via INTRAVENOUS

## 2012-05-04 MED ORDER — ACETAMINOPHEN 325 MG PO TABS
650.0000 mg | ORAL_TABLET | Freq: Once | ORAL | Status: AC
  Administered 2012-05-04: 650 mg via ORAL

## 2012-05-04 NOTE — Telephone Encounter (Signed)
appts made and printed for pt  Pt aware that tx will be added on 10/23  email to mw   aom

## 2012-05-04 NOTE — Patient Instructions (Addendum)
[facility] Discharge Instructions for Patients Receiving Chemotherapy  Today you received the following chemotherapy agents: Rituxan, Cytoxan  To help prevent nausea and vomiting after your treatment, we encourage you to take your nausea medication.    If you develop nausea and vomiting that is not controlled by your nausea medication, call the clinic.    BELOW ARE SYMPTOMS THAT SHOULD BE REPORTED IMMEDIATELY:  *FEVER GREATER THAN 100.5 F  *CHILLS WITH OR WITHOUT FEVER  NAUSEA AND VOMITING THAT IS NOT CONTROLLED WITH YOUR NAUSEA MEDICATION  *UNUSUAL SHORTNESS OF BREATH  *UNUSUAL BRUISING OR BLEEDING  TENDERNESS IN MOUTH AND THROAT WITH OR WITHOUT PRESENCE OF ULCERS  *URINARY PROBLEMS  *BOWEL PROBLEMS  UNUSUAL RASH Items with * indicate a potential emergency and should be followed up as soon as possible.  The clinic phone number is [phone].

## 2012-05-04 NOTE — Progress Notes (Signed)
   [facility]    OFFICE PROGRESS NOTE   INTERVAL HISTORY:   She returns as scheduled. She completed another cycle of chemotherapy on 04/13/2012. She received Neulasta on 04/14/2012. Ms. Hirt reports tolerating the chemotherapy well. No nausea or mouth sores.  She has numbness in the left greater than right fingertips. This does not interfere with activity. She complains of discomfort in the bilateral mid chest wall with certain movements. Her voice is intermittently hoarse.  Objective:  Vital signs in last 24 hours:  Blood pressure 99/61, pulse 93, temperature 96.9 F (36.1 C), temperature source Oral, resp. rate 20, height 5\' 4"  (1.626 m), weight 184 lb 14.4 oz (83.87 kg).    HEENT: No thrush or ulcers Resp: Lungs clear bilaterally Cardio: Regular rate and rhythm GI: No hepatosplenomegaly, nontender Vascular: No leg edema Neuro: Moderate decrease in vibratory sense at the fingertips bilaterally   Portacath/PICC-without erythema  Lab Results:  Lab Results  Component Value Date   WBC 5.2 05/04/2012   HGB 11.3* 05/04/2012   HCT 35.8 05/04/2012   MCV 87.7 05/04/2012   PLT 243 05/04/2012   ANC 4.8   Medications: I have reviewed the patient's current medications.  1. ANon-Hodgkin's lymphoma, large B-cell lymphoma, CD20 positive involving 8 needle core biopsy of a right iliac lymph node 02/12/2012. CT of the abdomen and pelvis on 01/15/2012 showed abdominal/pelvic lymphadenopathy. Serum LDH in normal range on 02/25/2012. Staging PET scan 02/25/2012 with a small lymph node posteriolateral to the oropharynx on the left measuring 9 x 7 mm with SUV 5.4; large cluster of hypermetabolic lymph nodes in the right hilum with SUV 4.3; cluster of mildly enlarged hepatoduodenal ligament lymph nodes and mildly enlarged portacaval lymph node with hypermetabolic activity (SUV max 37.1); numerous borderline enlarged and mildly enlarged periaortic retroperitoneal lymph nodes with  extensive hypermetabolic activity (SUV max 43.6-47.6); numerous borderline enlarged and mildly enlarged hypermetabolic lymph nodes along the pelvic sidewall bilaterally most pronounced on the right with a right external iliac lymph node measuring 2.9 x 4.1 cm (SUV max 52.5). Bone marrow aspiration/biopsy 03/09/2012 negative for involvement with lymphoma. Status post cycle 1 of CHOP-Rituxan on 03/15/2012 2. History of Night sweats likely secondary to #1. 3. Rectal bleeding June 2013 status post negative colonoscopy 01/28/2012 and upper endoscopy 02/11/2012. The bleeding was likely related to hemorrhoids. 4. COPD. 5. Remote history of larynx cancer. 6. Status post Port-A-Cath placement 03/09/2012. 7. 2-D echo 03/11/2012 with left ventricular ejection fraction 60-65%. 8. Admission with febrile neutropenia 03/22/2012 with no source for infection identified 9. Admission with fever and a chest x-ray consistent with "pneumonia "on 03/27/2012. She completed a course of Levaquin. 10. Pancytopenia following cycle 1 of CHOP-rituximab-resolved. 11. History of atrial fibrillation-maintained on Coumadin.the PT/INR will be checked today.        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 The vincristine was dose reduced with cycle 2. The vincristine will be discontinued from the chemotherapy today.     She tolerated cycle 2 of CHOP-rituximab without significant acute toxicity. The plan is to proceed with cycle 3 today. She will return for an office visit and cycle 4 in 3 weeks. The plan is to schedule a restaging PET scan after cycle 4.       Thornton Papas, MD  05/04/2012  7:27 PM

## 2012-05-05 ENCOUNTER — Other Ambulatory Visit (HOSPITAL_BASED_OUTPATIENT_CLINIC_OR_DEPARTMENT_OTHER): Payer: Medicare Other | Admitting: Lab

## 2012-05-05 ENCOUNTER — Ambulatory Visit (HOSPITAL_BASED_OUTPATIENT_CLINIC_OR_DEPARTMENT_OTHER): Payer: Medicare Other

## 2012-05-05 VITALS — BP 125/69 | HR 101 | Temp 98.0°F

## 2012-05-05 DIAGNOSIS — C8588 Other specified types of non-Hodgkin lymphoma, lymph nodes of multiple sites: Secondary | ICD-10-CM

## 2012-05-05 DIAGNOSIS — C8510 Unspecified B-cell lymphoma, unspecified site: Secondary | ICD-10-CM

## 2012-05-05 DIAGNOSIS — I4891 Unspecified atrial fibrillation: Secondary | ICD-10-CM

## 2012-05-05 DIAGNOSIS — Z7901 Long term (current) use of anticoagulants: Secondary | ICD-10-CM

## 2012-05-05 LAB — PROTIME-INR
INR: 2.3 (ref 2.00–3.50)
Protime: 27.6 Seconds — ABNORMAL HIGH (ref 10.6–13.4)

## 2012-05-05 MED ORDER — PEGFILGRASTIM INJECTION 6 MG/0.6ML
6.0000 mg | Freq: Once | SUBCUTANEOUS | Status: AC
  Administered 2012-05-05: 6 mg via SUBCUTANEOUS
  Filled 2012-05-05: qty 0.6

## 2012-05-22 ENCOUNTER — Other Ambulatory Visit: Payer: Self-pay | Admitting: Oncology

## 2012-05-25 ENCOUNTER — Telehealth: Payer: Self-pay | Admitting: Oncology

## 2012-05-25 ENCOUNTER — Telehealth: Payer: Self-pay | Admitting: *Deleted

## 2012-05-25 ENCOUNTER — Ambulatory Visit (HOSPITAL_BASED_OUTPATIENT_CLINIC_OR_DEPARTMENT_OTHER): Payer: Medicare Other

## 2012-05-25 ENCOUNTER — Other Ambulatory Visit (HOSPITAL_BASED_OUTPATIENT_CLINIC_OR_DEPARTMENT_OTHER): Payer: Medicare Other | Admitting: Lab

## 2012-05-25 ENCOUNTER — Ambulatory Visit (HOSPITAL_BASED_OUTPATIENT_CLINIC_OR_DEPARTMENT_OTHER): Payer: Medicare Other | Admitting: Nurse Practitioner

## 2012-05-25 VITALS — BP 129/75 | HR 96 | Temp 97.8°F | Resp 20

## 2012-05-25 VITALS — BP 141/75 | HR 93 | Temp 96.5°F | Resp 22 | Wt 188.5 lb

## 2012-05-25 DIAGNOSIS — I4891 Unspecified atrial fibrillation: Secondary | ICD-10-CM

## 2012-05-25 DIAGNOSIS — C8588 Other specified types of non-Hodgkin lymphoma, lymph nodes of multiple sites: Secondary | ICD-10-CM

## 2012-05-25 DIAGNOSIS — C8510 Unspecified B-cell lymphoma, unspecified site: Secondary | ICD-10-CM

## 2012-05-25 DIAGNOSIS — Z5112 Encounter for antineoplastic immunotherapy: Secondary | ICD-10-CM

## 2012-05-25 DIAGNOSIS — Z5111 Encounter for antineoplastic chemotherapy: Secondary | ICD-10-CM

## 2012-05-25 DIAGNOSIS — J449 Chronic obstructive pulmonary disease, unspecified: Secondary | ICD-10-CM

## 2012-05-25 LAB — CBC WITH DIFFERENTIAL/PLATELET
Basophils Absolute: 0.1 10*3/uL (ref 0.0–0.1)
HCT: 33.4 % — ABNORMAL LOW (ref 34.8–46.6)
HGB: 10.7 g/dL — ABNORMAL LOW (ref 11.6–15.9)
LYMPH%: 12.7 % — ABNORMAL LOW (ref 14.0–49.7)
MCH: 29.1 pg (ref 25.1–34.0)
MONO#: 0.5 10*3/uL (ref 0.1–0.9)
NEUT%: 74.8 % (ref 38.4–76.8)
Platelets: 219 10*3/uL (ref 145–400)
WBC: 5.2 10*3/uL (ref 3.9–10.3)
lymph#: 0.7 10*3/uL — ABNORMAL LOW (ref 0.9–3.3)

## 2012-05-25 LAB — COMPREHENSIVE METABOLIC PANEL (CC13)
Albumin: 3.8 g/dL (ref 3.5–5.0)
BUN: 10 mg/dL (ref 7.0–26.0)
Calcium: 9.7 mg/dL (ref 8.4–10.4)
Chloride: 109 mEq/L — ABNORMAL HIGH (ref 98–107)
Glucose: 126 mg/dl — ABNORMAL HIGH (ref 70–99)
Potassium: 4.3 mEq/L (ref 3.5–5.1)

## 2012-05-25 LAB — PROTIME-INR
INR: 1.6 — ABNORMAL LOW (ref 2.00–3.50)
Protime: 19.2 s — ABNORMAL HIGH (ref 10.6–13.4)

## 2012-05-25 MED ORDER — ACETAMINOPHEN 325 MG PO TABS
650.0000 mg | ORAL_TABLET | Freq: Once | ORAL | Status: AC
  Administered 2012-05-25: 650 mg via ORAL

## 2012-05-25 MED ORDER — DIPHENHYDRAMINE HCL 25 MG PO CAPS
50.0000 mg | ORAL_CAPSULE | Freq: Once | ORAL | Status: AC
  Administered 2012-05-25: 50 mg via ORAL

## 2012-05-25 MED ORDER — SODIUM CHLORIDE 0.9 % IV SOLN
Freq: Once | INTRAVENOUS | Status: AC
  Administered 2012-05-25: 11:00:00 via INTRAVENOUS

## 2012-05-25 MED ORDER — SODIUM CHLORIDE 0.9 % IJ SOLN
10.0000 mL | INTRAMUSCULAR | Status: DC | PRN
  Filled 2012-05-25: qty 10

## 2012-05-25 MED ORDER — SODIUM CHLORIDE 0.9 % IV SOLN
375.0000 mg/m2 | Freq: Once | INTRAVENOUS | Status: AC
  Administered 2012-05-25: 800 mg via INTRAVENOUS
  Filled 2012-05-25: qty 80

## 2012-05-25 MED ORDER — HEPARIN SOD (PORK) LOCK FLUSH 100 UNIT/ML IV SOLN
500.0000 [IU] | Freq: Once | INTRAVENOUS | Status: DC | PRN
  Filled 2012-05-25: qty 5

## 2012-05-25 MED ORDER — DOXORUBICIN HCL CHEMO IV INJECTION 2 MG/ML
40.0000 mg/m2 | Freq: Once | INTRAVENOUS | Status: AC
  Administered 2012-05-25: 80 mg via INTRAVENOUS
  Filled 2012-05-25: qty 40

## 2012-05-25 MED ORDER — ONDANSETRON 16 MG/50ML IVPB (CHCC)
16.0000 mg | Freq: Once | INTRAVENOUS | Status: AC
  Administered 2012-05-25: 16 mg via INTRAVENOUS

## 2012-05-25 MED ORDER — SODIUM CHLORIDE 0.9 % IV SOLN
600.0000 mg/m2 | Freq: Once | INTRAVENOUS | Status: AC
  Administered 2012-05-25: 1200 mg via INTRAVENOUS
  Filled 2012-05-25: qty 60

## 2012-05-25 NOTE — Progress Notes (Signed)
OFFICE PROGRESS NOTE  Interval history:  Ms. Reveron returns as scheduled. She completed cycle 3 CHOP/Rituxan on 05/04/2012. She receives Neulasta support. She denies nausea/vomiting. No mouth sores. She notes constipation following each treatment. The constipation is relieved with a stool softener. She developed arthralgias around day 3. She continues to have numbness in the fingertips and toes. The numbness is overall stable.   Objective: Blood pressure 141/75, pulse 93, temperature 96.5 F (35.8 C), temperature source Oral, resp. rate 22, weight 188 lb 8 oz (85.503 kg).  Oropharynx is without thrush or ulceration. Lungs are clear. Regular cardiac rhythm. Port-A-Cath site is without erythema. Abdomen is soft and nontender. No organomegaly. Extremities are without edema. Calves are soft and nontender.  Lab Results: Lab Results  Component Value Date   WBC 5.2 05/25/2012   HGB 10.7* 05/25/2012   HCT 33.4* 05/25/2012   MCV 90.8 05/25/2012   PLT 219 05/25/2012    Chemistry:    Chemistry      Component Value Date/Time   NA 140 05/04/2012 0854   NA 133* 03/30/2012 0630   K 4.1 05/04/2012 0854   K 3.7 03/30/2012 0630   CL 108* 05/04/2012 0854   CL 99 03/30/2012 0630   CO2 22 05/04/2012 0854   CO2 26 03/30/2012 0630   BUN 11.0 05/04/2012 0854   BUN 9 03/30/2012 0630   CREATININE 0.9 05/04/2012 0854   CREATININE 0.71 03/30/2012 0630      Component Value Date/Time   CALCIUM 9.5 05/04/2012 0854   CALCIUM 8.5 03/30/2012 0630   ALKPHOS 77 05/04/2012 0854   ALKPHOS 80 03/27/2012 1810   AST 31 05/04/2012 0854   AST 24 03/27/2012 1810   ALT 33 05/04/2012 0854   ALT 27 03/27/2012 1810   BILITOT 0.60 05/04/2012 0854   BILITOT 0.6 03/27/2012 1810       Studies/Results: No results found.  Medications: I have reviewed the patient's current medications.  Assessment/Plan:  1. Non-Hodgkin's lymphoma, large B-cell lymphoma, CD20 positive involving 8 needle core biopsy of a right iliac lymph node  02/12/2012. CT of the abdomen and pelvis on 01/15/2012 showed abdominal/pelvic lymphadenopathy. Serum LDH in normal range on 02/25/2012. Staging PET scan 02/25/2012 with a small lymph node posteriolateral to the oropharynx on the left measuring 9 x 7 mm with SUV 5.4; large cluster of hypermetabolic lymph nodes in the right hilum with SUV 4.3; cluster of mildly enlarged hepatoduodenal ligament lymph nodes and mildly enlarged portacaval lymph node with hypermetabolic activity (SUV max 37.1); numerous borderline enlarged and mildly enlarged periaortic retroperitoneal lymph nodes with extensive hypermetabolic activity (SUV max 43.6-47.6); numerous borderline enlarged and mildly enlarged hypermetabolic lymph nodes along the pelvic sidewall bilaterally most pronounced on the right with a right external iliac lymph node measuring 2.9 x 4.1 cm (SUV max 52.5). Bone marrow aspiration/biopsy 03/09/2012 negative for involvement with lymphoma. Status post cycle 1 of CHOP-Rituxan on 03/15/2012. She completed cycle 3 on 05/04/2012. 2. History of night sweats likely secondary to #1. 3. Rectal bleeding June 2013 status post negative colonoscopy 01/28/2012 and upper endoscopy 02/11/2012. The bleeding was likely related to hemorrhoids. 4. COPD. 5. Remote history of larynx cancer. 6. Status post Port-A-Cath placement 03/09/2012. 7. 2-D echo 03/11/2012 with left ventricular ejection fraction 60-65%. 8. Admission with febrile neutropenia 03/22/2012 with no source for infection identified 9. Admission with fever and a chest x-ray consistent with "pneumonia "on 03/27/2012. She completed a course of Levaquin. 10. Pancytopenia following cycle 1 of CHOP-rituximab-resolved. 11. History  of atrial fibrillation-maintained on Coumadin. She will continue Coumadin at the current dose. 12. Numbness/tingling in the fingertips and toes. 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 The vincristine was dose reduced with cycle 2. The vincristine  was discontinued beginning with cycle 3.  Disposition-Ms.Gressman appears stable. She has completed 3 cycles of CHOP/Rituxan. Plan to proceed with cycle 4 today as scheduled. We are referring her for a restaging PET scan in approximately 2 weeks. She will return for a followup visit and cycle 5 CHOP/Rituxan in 3 weeks. She will contact the office in the interim with any problems.  Plan reviewed with Dr. Truett Perna.     Lonna Cobb ANP/GNP-BC

## 2012-05-25 NOTE — Telephone Encounter (Signed)
Per staff message and POF I have scheduled/adjuted appts.  JMW

## 2012-05-25 NOTE — Telephone Encounter (Signed)
appts made and printed for pt pt aware that mw will move tx

## 2012-05-25 NOTE — Patient Instructions (Signed)
[facility] Discharge Instructions for Patients Receiving Chemotherapy  Today you received the following chemotherapy agents Rituxan/Cytoxan/Adriamycin To help prevent nausea and vomiting after your treatment, we encourage you to take your nausea medication as prescribed.  If you develop nausea and vomiting that is not controlled by your nausea medication, call the clinic. If it is after clinic hours your family physician or the after hours number for the clinic or go to the Emergency Department.   BELOW ARE SYMPTOMS THAT SHOULD BE REPORTED IMMEDIATELY:  *FEVER GREATER THAN 100.5 F  *CHILLS WITH OR WITHOUT FEVER  NAUSEA AND VOMITING THAT IS NOT CONTROLLED WITH YOUR NAUSEA MEDICATION  *UNUSUAL SHORTNESS OF BREATH  *UNUSUAL BRUISING OR BLEEDING  TENDERNESS IN MOUTH AND THROAT WITH OR WITHOUT PRESENCE OF ULCERS  *URINARY PROBLEMS  *BOWEL PROBLEMS  UNUSUAL RASH Items with * indicate a potential emergency and should be followed up as soon as possible.  One of the nurses will contact you 24 hours after your treatment. Please let the nurse know about any problems that you may have experienced. Feel free to call the clinic you have any questions or concerns. The clinic phone number is [phone].   I have been informed and understand all the instructions given to me. I know to contact the clinic, my physician, or go to the Emergency Department if any problems should occur. I do not have any questions at this time, but understand that I may call the clinic during office hours   should I have any questions or need assistance in obtaining follow up care.    __________________________________________  _____________  __________ Signature of Patient or Authorized Representative            Date                   Time    __________________________________________ Nurse's Signature

## 2012-05-26 ENCOUNTER — Ambulatory Visit (HOSPITAL_BASED_OUTPATIENT_CLINIC_OR_DEPARTMENT_OTHER): Payer: Medicare Other

## 2012-05-26 VITALS — BP 113/66 | HR 85 | Temp 97.6°F

## 2012-05-26 DIAGNOSIS — C8588 Other specified types of non-Hodgkin lymphoma, lymph nodes of multiple sites: Secondary | ICD-10-CM

## 2012-05-26 DIAGNOSIS — C8510 Unspecified B-cell lymphoma, unspecified site: Secondary | ICD-10-CM

## 2012-05-26 MED ORDER — PEGFILGRASTIM INJECTION 6 MG/0.6ML
6.0000 mg | Freq: Once | SUBCUTANEOUS | Status: AC
  Administered 2012-05-26: 6 mg via SUBCUTANEOUS
  Filled 2012-05-26: qty 0.6

## 2012-05-30 ENCOUNTER — Encounter: Payer: Self-pay | Admitting: Internal Medicine

## 2012-05-30 ENCOUNTER — Ambulatory Visit (INDEPENDENT_AMBULATORY_CARE_PROVIDER_SITE_OTHER): Payer: Medicare Other | Admitting: Internal Medicine

## 2012-05-30 VITALS — BP 150/84 | HR 80 | Temp 98.7°F | Resp 18 | Ht 64.0 in | Wt 191.0 lb

## 2012-05-30 DIAGNOSIS — F3289 Other specified depressive episodes: Secondary | ICD-10-CM

## 2012-05-30 DIAGNOSIS — K649 Unspecified hemorrhoids: Secondary | ICD-10-CM

## 2012-05-30 DIAGNOSIS — F32A Depression, unspecified: Secondary | ICD-10-CM

## 2012-05-30 DIAGNOSIS — F329 Major depressive disorder, single episode, unspecified: Secondary | ICD-10-CM

## 2012-05-30 MED ORDER — HYDROCORTISONE ACE-PRAMOXINE 2.5-1 % RE CREA: TOPICAL_CREAM | Freq: Three times a day (TID) | RECTAL | Status: DC

## 2012-05-30 MED ORDER — DULOXETINE HCL 30 MG PO CPEP: 30.0000 mg | ORAL_CAPSULE | Freq: Every day | ORAL | Status: DC

## 2012-05-30 NOTE — Progress Notes (Signed)
Subjective:    Patient ID: [name], female    DOB: [birth year], 78 y.o.   MRN: [id number]  HPI Has noted increased hemorrhoidal bleeding  Has detected palpitations and anxiety with depression and crying Has been week and has noted increased ST memory issues    Review of Systems  Constitutional: Negative for activity change, appetite change and fatigue.  HENT: Negative for ear pain, congestion, neck pain, postnasal drip and sinus pressure.   Eyes: Negative for redness and visual disturbance.  Respiratory: Negative for cough, shortness of breath and wheezing.   Gastrointestinal: Negative for abdominal pain and abdominal distention.  Genitourinary: Negative for dysuria, frequency and menstrual problem.  Musculoskeletal: Negative for myalgias, joint swelling and arthralgias.  Skin: Negative for rash and wound.  Neurological: Negative for dizziness, weakness and headaches.  Hematological: Negative for adenopathy. Does not bruise/bleed easily.  Psychiatric/Behavioral: Positive for dysphoric mood and decreased concentration. Negative for disturbed wake/sleep cycle.   Past Medical History  Diagnosis Date  . GERD (gastroesophageal reflux disease)   . Arthritis   . Stroke 10/2002    no residual  . History of TIAs   . Diverticulosis   . PUD (peptic ulcer disease)   . PVD (peripheral vascular disease)   . Hyperlipidemia   . Anxiety   . Depression   . 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 Blood transfusion   . COPD (chronic obstructive pulmonary disease)   . Lymphona, mantle cell, inguinal region/lower limb     History   Social History  . Marital Status: Married    Spouse Name: N/A    Number of Children: 6  . Years of Education: N/A   Occupational History  . retired    Social History Main Topics  . Smoking status: Former Games developer  . Smokeless tobacco: Never Used  . Alcohol Use: No  . Drug Use: No  . Sexually Active:  Yes   Other Topics Concern  . Not on file   Social History Narrative  . No narrative on file    Past Surgical History  Procedure Date  . Appendectomy   . Colon surgery 09/13/2008    iliostomy for diverticulitis  . Hernia repair   . Tubal ligation   . Throat surgery 1986  . Total knee arthroplasty     2002 left 2008 right  . Reversal of iliostomy 01/2010  . Cataract extraction 2005    bilateral  . Carotid surgery     2004 a month apart right and left    Family History  Problem Relation Age of Onset  . Colon cancer Sister     colon  . Hyperlipidemia Mother   . Stroke Mother   . Cancer Father     mesothelioma    Allergies  Allergen Reactions  . Propoxyphene Hcl   . Propoxyphene-Acetaminophen     REACTION: Reaction not known    Current Outpatient Prescriptions on File Prior to Visit  Medication Sig Dispense Refill  . ALPRAZolam (XANAX) 0.5 MG tablet Take 1 tablet (0.5 mg total) by mouth 3 (three) times daily as needed for sleep.  90 tablet  3  . Alum & Mag Hydroxide-Simeth (MAGIC MOUTHWASH) SOLN Take 10 mLs by mouth every 4 (four) hours as needed. Swish and swallow.  Not within two hours of cipro, avelox or tetracycline  240 mL  0  . bisoprolol (ZEBETA) 5 MG tablet Take 5 mg by mouth daily.       Marland Kitchen  clotrimazole-betamethasone (LOTRISONE) lotion Apply topically as needed.       . lidocaine-prilocaine (EMLA) cream Apply to port-a-cath site 1-2 hours prior to chemo.  Cover with plastic wrap  30 g  1  . PRESCRIPTION MEDICATION Pt gets chemo at [facility].. 1 treatment per 3 weeks. Followed Dr Alcide Evener. Pt's next treatment is on the sep 3rd      . prochlorperazine (COMPAZINE) 10 MG tablet Take 10 mg by mouth every 6 (six) hours as needed. For nausea.      Marland Kitchen tiotropium (SPIRIVA) 18 MCG inhalation capsule Place 18 mcg into inhaler and inhale daily.      . Vitamin D, Ergocalciferol, (DRISDOL) 50000 UNITS CAPS Take 50,000 Units by mouth every 7 (seven) days. Taken on Sundays.      Marland Kitchen  warfarin (COUMADIN) 2.5 MG tablet Take 5 mg by mouth daily. Take 2 tabs by mouth daily        BP 150/84  Pulse 80  Temp 98.7 F (37.1 C)  Resp 18  Ht 5\' 4"  (1.626 m)  Wt 191 lb (86.637 kg)  BMI 32.79 kg/m2       Objective:   Physical Exam  Nursing note and vitals reviewed. Constitutional: She is oriented to person, place, and time. She appears well-developed and well-nourished. No distress.  HENT:  Head: Normocephalic and atraumatic.  Right Ear: External ear normal.  Left Ear: External ear normal.  Nose: Nose normal.  Mouth/Throat: Oropharynx is clear and moist.  Eyes: Conjunctivae normal and EOM are normal. Pupils are equal, round, and reactive to light.  Neck: Normal range of motion. Neck supple. No JVD present. No tracheal deviation present. No thyromegaly present.  Cardiovascular: Normal rate, regular rhythm, normal heart sounds and intact distal pulses.   No murmur heard. Pulmonary/Chest: Effort normal and breath sounds normal. She has no wheezes. She exhibits no tenderness.  Abdominal: Soft. Bowel sounds are normal.  Musculoskeletal: Normal range of motion. She exhibits no edema and no tenderness.  Lymphadenopathy:    She has no cervical adenopathy.  Neurological: She is alert and oriented to person, place, and time. She has normal reflexes. No cranial nerve deficit.  Skin: Skin is warm and dry. She is not diaphoretic.  Psychiatric: She has a normal mood and affect. Her behavior is normal.          Assessment & Plan:  Discussion of constipations and will look at using senakot for the constipation Avoid "probiotics" Hemorrhoidal cream prescibed Depression cymbalta trial

## 2012-06-10 ENCOUNTER — Encounter (HOSPITAL_COMMUNITY)
Admission: RE | Admit: 2012-06-10 | Discharge: 2012-06-10 | Disposition: A | Discharge status: Home / Self Care | Payer: Medicare Other | Source: Ambulatory Visit | Attending: Nurse Practitioner | Admitting: Nurse Practitioner

## 2012-06-10 DIAGNOSIS — C8589 Other specified types of non-Hodgkin lymphoma, extranodal and solid organ sites: Secondary | ICD-10-CM | POA: Insufficient documentation

## 2012-06-10 DIAGNOSIS — J9819 Other pulmonary collapse: Secondary | ICD-10-CM | POA: Insufficient documentation

## 2012-06-10 DIAGNOSIS — K8020 Calculus of gallbladder without cholecystitis without obstruction: Secondary | ICD-10-CM | POA: Insufficient documentation

## 2012-06-10 DIAGNOSIS — I7090 Unspecified atherosclerosis: Secondary | ICD-10-CM | POA: Insufficient documentation

## 2012-06-10 DIAGNOSIS — C8510 Unspecified B-cell lymphoma, unspecified site: Secondary | ICD-10-CM

## 2012-06-10 LAB — GLUCOSE, CAPILLARY: Glucose-Capillary: 110 mg/dL — ABNORMAL HIGH (ref 70–99)

## 2012-06-10 MED ORDER — FLUDEOXYGLUCOSE F - 18 (FDG) INJECTION
17.3000 | Freq: Once | INTRAVENOUS | Status: AC | PRN
  Administered 2012-06-10: 17.3 via INTRAVENOUS

## 2012-06-12 ENCOUNTER — Other Ambulatory Visit: Payer: Self-pay | Admitting: Oncology

## 2012-06-15 ENCOUNTER — Ambulatory Visit (HOSPITAL_BASED_OUTPATIENT_CLINIC_OR_DEPARTMENT_OTHER): Payer: Medicare Other

## 2012-06-15 ENCOUNTER — Other Ambulatory Visit: Payer: Self-pay | Admitting: Oncology

## 2012-06-15 ENCOUNTER — Ambulatory Visit: Payer: Medicare Other | Admitting: Oncology

## 2012-06-15 ENCOUNTER — Ambulatory Visit (HOSPITAL_BASED_OUTPATIENT_CLINIC_OR_DEPARTMENT_OTHER): Payer: Medicare Other | Admitting: Nurse Practitioner

## 2012-06-15 ENCOUNTER — Other Ambulatory Visit: Payer: Medicare Other | Admitting: Lab

## 2012-06-15 ENCOUNTER — Other Ambulatory Visit (HOSPITAL_BASED_OUTPATIENT_CLINIC_OR_DEPARTMENT_OTHER): Payer: Medicare Other | Admitting: Lab

## 2012-06-15 VITALS — BP 115/70 | HR 86 | Temp 97.5°F | Resp 20

## 2012-06-15 VITALS — BP 110/53 | HR 93 | Temp 97.6°F | Resp 20 | Ht 64.0 in | Wt 184.8 lb

## 2012-06-15 DIAGNOSIS — Z7901 Long term (current) use of anticoagulants: Secondary | ICD-10-CM

## 2012-06-15 DIAGNOSIS — C8588 Other specified types of non-Hodgkin lymphoma, lymph nodes of multiple sites: Secondary | ICD-10-CM

## 2012-06-15 DIAGNOSIS — C8589 Other specified types of non-Hodgkin lymphoma, extranodal and solid organ sites: Secondary | ICD-10-CM

## 2012-06-15 DIAGNOSIS — Z5112 Encounter for antineoplastic immunotherapy: Secondary | ICD-10-CM

## 2012-06-15 DIAGNOSIS — C8510 Unspecified B-cell lymphoma, unspecified site: Secondary | ICD-10-CM

## 2012-06-15 DIAGNOSIS — Z5181 Encounter for therapeutic drug level monitoring: Secondary | ICD-10-CM

## 2012-06-15 DIAGNOSIS — I4891 Unspecified atrial fibrillation: Secondary | ICD-10-CM

## 2012-06-15 LAB — COMPREHENSIVE METABOLIC PANEL (CC13)
AST: 22 U/L (ref 5–34)
Albumin: 3.7 g/dL (ref 3.5–5.0)
Alkaline Phosphatase: 69 U/L (ref 40–150)
Glucose: 122 mg/dl — ABNORMAL HIGH (ref 70–99)
Potassium: 4.4 mEq/L (ref 3.5–5.1)
Sodium: 139 mEq/L (ref 136–145)
Total Bilirubin: 0.54 mg/dL (ref 0.20–1.20)
Total Protein: 6.7 g/dL (ref 6.4–8.3)

## 2012-06-15 LAB — CBC WITH DIFFERENTIAL/PLATELET
BASO%: 1.3 % (ref 0.0–2.0)
EOS%: 0.6 % (ref 0.0–7.0)
MCH: 29.8 pg (ref 25.1–34.0)
MCHC: 31.8 g/dL (ref 31.5–36.0)
MCV: 93.9 fL (ref 79.5–101.0)
MONO%: 17.9 % — ABNORMAL HIGH (ref 0.0–14.0)
RBC: 3.62 10*6/uL — ABNORMAL LOW (ref 3.70–5.45)
RDW: 17.7 % — ABNORMAL HIGH (ref 11.2–14.5)
lymph#: 0.5 10*3/uL — ABNORMAL LOW (ref 0.9–3.3)

## 2012-06-15 LAB — PROTIME-INR: INR: 1.4 — ABNORMAL LOW (ref 2.00–3.50)

## 2012-06-15 MED ORDER — SODIUM CHLORIDE 0.9 % IJ SOLN
10.0000 mL | INTRAMUSCULAR | Status: DC | PRN
  Administered 2012-06-15: 10 mL
  Filled 2012-06-15: qty 10

## 2012-06-15 MED ORDER — RITUXIMAB CHEMO INJECTION 10 MG/ML
375.0000 mg/m2 | Freq: Once | INTRAVENOUS | Status: AC
  Administered 2012-06-15: 800 mg via INTRAVENOUS
  Filled 2012-06-15: qty 80

## 2012-06-15 MED ORDER — DOXORUBICIN HCL CHEMO IV INJECTION 2 MG/ML
40.0000 mg/m2 | Freq: Once | INTRAVENOUS | Status: AC
  Administered 2012-06-15: 80 mg via INTRAVENOUS
  Filled 2012-06-15: qty 40

## 2012-06-15 MED ORDER — SODIUM CHLORIDE 0.9 % IV SOLN
Freq: Once | INTRAVENOUS | Status: AC
  Administered 2012-06-15: 11:00:00 via INTRAVENOUS

## 2012-06-15 MED ORDER — HEPARIN SOD (PORK) LOCK FLUSH 100 UNIT/ML IV SOLN
500.0000 [IU] | Freq: Once | INTRAVENOUS | Status: AC | PRN
  Administered 2012-06-15: 500 [IU]
  Filled 2012-06-15: qty 5

## 2012-06-15 MED ORDER — SODIUM CHLORIDE 0.9 % IV SOLN
600.0000 mg/m2 | Freq: Once | INTRAVENOUS | Status: AC
  Administered 2012-06-15: 1200 mg via INTRAVENOUS
  Filled 2012-06-15: qty 60

## 2012-06-15 MED ORDER — DIPHENHYDRAMINE HCL 25 MG PO CAPS
50.0000 mg | ORAL_CAPSULE | Freq: Once | ORAL | Status: AC
  Administered 2012-06-15: 50 mg via ORAL

## 2012-06-15 MED ORDER — ACETAMINOPHEN 325 MG PO TABS
650.0000 mg | ORAL_TABLET | Freq: Once | ORAL | Status: AC
  Administered 2012-06-15: 650 mg via ORAL

## 2012-06-15 MED ORDER — PREDNISONE 20 MG PO TABS: 60.0000 mg | ORAL_TABLET | Freq: Every day | ORAL | Status: DC

## 2012-06-15 MED ORDER — ONDANSETRON 16 MG/50ML IVPB (CHCC)
16.0000 mg | Freq: Once | INTRAVENOUS | Status: AC
  Administered 2012-06-15: 16 mg via INTRAVENOUS

## 2012-06-15 NOTE — Patient Instructions (Addendum)
[facility] Discharge Instructions for Patients Receiving Chemotherapy  Today you received the following chemotherapy agents Adriamycin, Cytoxan and Rituxan.  To help prevent nausea and vomiting after your treatment, we encourage you to take your nausea medication as ordered per MD.   If you develop nausea and vomiting that is not controlled by your nausea medication, call the clinic. If it is after clinic hours your family physician or the after hours number for the clinic or go to the Emergency Department.   BELOW ARE SYMPTOMS THAT SHOULD BE REPORTED IMMEDIATELY:  *FEVER GREATER THAN 100.5 F  *CHILLS WITH OR WITHOUT FEVER  NAUSEA AND VOMITING THAT IS NOT CONTROLLED WITH YOUR NAUSEA MEDICATION  *UNUSUAL SHORTNESS OF BREATH  *UNUSUAL BRUISING OR BLEEDING  TENDERNESS IN MOUTH AND THROAT WITH OR WITHOUT PRESENCE OF ULCERS  *URINARY PROBLEMS  *BOWEL PROBLEMS  UNUSUAL RASH Items with * indicate a potential emergency and should be followed up as soon as possible.   Please let the nurse know about any problems that you may have experienced. Feel free to call the clinic you have any questions or concerns. The clinic phone number is [phone].   I have been informed and understand all the instructions given to me. I know to contact the clinic, my physician, or go to the Emergency Department if any problems should occur. I do not have any questions at this time, but understand that I may call the clinic during office hours   should I have any questions or need assistance in obtaining follow up care.

## 2012-06-15 NOTE — Progress Notes (Signed)
OFFICE PROGRESS NOTE  Interval history:  [name] returns as scheduled. She completed cycle 4 CHOP/Rituxan on 05/25/2012 with Neulasta support. Restaging PET scan on 06/10/2012 showed resolution of thoracic adenopathy, retroperitoneal adenopathy and near complete resolution of the porta hepatis adenopathy.  She denies nausea/vomiting. No mouth sores. She has intermittent constipation. She has a good appetite. The numbness in the fingertips is somewhat better. She was recently started on Cymbalta for depression. She denies suicidal ideation.   Objective: Blood pressure 110/53, pulse 93, temperature 97.6 F (36.4 C), temperature source Oral, resp. rate 20, height 5\' 4"  (1.626 m), weight 184 lb 12.8 oz (83.825 kg).  Oropharynx is without thrush or ulceration. No palpable cervical or supraclavicular lymph nodes. Lungs are clear. Regular cardiac rhythm. Port-A-Cath site is without erythema. Abdomen is soft and nontender. No organomegaly. Extremities are without edema. Calves are soft and nontender.  Lab Results: Lab Results  Component Value Date   WBC 5.3 06/15/2012   HGB 10.8* 06/15/2012   HCT 34.0* 06/15/2012   MCV 93.9 06/15/2012   PLT 221 06/15/2012    Chemistry:    Chemistry      Component Value Date/Time   NA 140 05/25/2012 0916   NA 133* 03/30/2012 0630   K 4.3 05/25/2012 0916   K 3.7 03/30/2012 0630   CL 109* 05/25/2012 0916   CL 99 03/30/2012 0630   CO2 20* 05/25/2012 0916   CO2 26 03/30/2012 0630   BUN 10.0 05/25/2012 0916   BUN 9 03/30/2012 0630   CREATININE 0.9 05/25/2012 0916   CREATININE 0.71 03/30/2012 0630      Component Value Date/Time   CALCIUM 9.7 05/25/2012 0916   CALCIUM 8.5 03/30/2012 0630   ALKPHOS 71 05/25/2012 0916   ALKPHOS 80 03/27/2012 1810   AST 30 05/25/2012 0916   AST 24 03/27/2012 1810   ALT 36 05/25/2012 0916   ALT 27 03/27/2012 1810   BILITOT 0.60 05/25/2012 0916   BILITOT 0.6 03/27/2012 1810       Studies/Results: Nm Pet Image Restag (ps)  Skull Base To Thigh  06/10/2012  *RADIOLOGY REPORT*  Clinical Data: Subsequent treatment strategy for large B-cell lymphoma.  NUCLEAR MEDICINE PET SKULL BASE TO THIGH  Fasting Blood Glucose:  110  Technique:  17.3 mCi F-18 FDG was injected intravenously. CT data was obtained and used for attenuation correction and anatomic localization only.  (This was not acquired as a diagnostic CT examination.) Additional exam technical data entered on technologist worksheet.  Comparison:  Multiple exams, including 02/25/2012  Findings:  Neck: No significant abnormal hypermetabolic activity in the neck. The carotid arteries are noted to extend in the retropharyngeal region just above the level of the pharyngoesophageal junction.  Chest:  Prior extensive right hilar and infrahilar hypermetabolic adenopathy has resolved.  Band-like density favoring atelectasis or scarring in the left upper lobe demonstrates slight hypermetabolic activity, maximum standard uptake value of 3.3.  Abdomen/Pelvis:  The porta hepatis node maximum standard uptake value is 3.5 (formerly 37.1).  This node previously measured 1.8 cm in short axis and currently measures 1.0 cm in short axis.  The hypermetabolic retroperitoneal adenopathy shown previously has resolved.  The hypermetabolic pelvic adenopathy shown previously has resolved.  Incidental gallstone and atherosclerosis noted. Anorectal activity is probably physiologic.  Skeleton:  Diffusely increased skeletal activity is probably related to marrow stimulation.  IMPRESSION:  1.  Markedly improved, with resolution of the thoracic adenopathy, resolution of the retroperitoneal adenopathy, and near complete resolution of the  porta hepatis adenopathy. 2.  Mild atelectasis in the left upper lobe adjacent to the minor fissure. 3.  Cholelithiasis. 4.  Atherosclerosis. 5.  Diffuse marrow activity, likely from marrow stimulation therapy.   Original Report Authenticated By: Gaylyn Rong, M.D.      Medications: I have reviewed the patient's current medications.  Assessment/Plan:  1. Non-Hodgkin's lymphoma, large B-cell lymphoma, CD20 positive involving 8 needle core biopsy of a right iliac lymph node 02/12/2012. CT of the abdomen and pelvis on 01/15/2012 showed abdominal/pelvic lymphadenopathy. Serum LDH in normal range on 02/25/2012. Staging PET scan 02/25/2012 with a small lymph node posteriolateral to the oropharynx on the left measuring 9 x 7 mm with SUV 5.4; large cluster of hypermetabolic lymph nodes in the right hilum with SUV 4.3; cluster of mildly enlarged hepatoduodenal ligament lymph nodes and mildly enlarged portacaval lymph node with hypermetabolic activity (SUV max 37.1); numerous borderline enlarged and mildly enlarged periaortic retroperitoneal lymph nodes with extensive hypermetabolic activity (SUV max 43.6-47.6); numerous borderline enlarged and mildly enlarged hypermetabolic lymph nodes along the pelvic sidewall bilaterally most pronounced on the right with a right external iliac lymph node measuring 2.9 x 4.1 cm (SUV max 52.5). Bone marrow aspiration/biopsy 03/09/2012 negative for involvement with lymphoma. Status post cycle 1 of CHOP-Rituxan on 03/15/2012. She completed cycle 3 on 05/04/2012 and cycle 4 on 05/25/2012. Restaging PET scan on 06/10/2012 was markedly improved with resolution of the thoracic and retroperitoneal adenopathy and near complete resolution of porta hepatis adenopathy. 2. History of night sweats likely secondary to #1. 3. Rectal bleeding June 2013 status post negative colonoscopy 01/28/2012 and upper endoscopy 02/11/2012. The bleeding was likely related to hemorrhoids. 4. COPD. 5. Remote history of larynx cancer. 6. Status post Port-A-Cath placement 03/09/2012. 7. 2-D echo 03/11/2012 with left ventricular ejection fraction 60-65%. 8. Admission with febrile neutropenia 03/22/2012 with no source for infection identified 9. Admission with fever and a  chest x-ray consistent with "pneumonia"on 03/27/2012. She completed a course of Levaquin. 10. Pancytopenia following cycle 1 of CHOP-rituximab-resolved. 11. History of atrial fibrillation-maintained on Coumadin. She has been taking Coumadin at a dose of 2.5 mg rather than 5 mg daily. The PT/INR remains subtherapeutic. She will increase the Coumadin dose to 5 mg daily. We will repeat the PT/INR on 06/27/2012. 12. Numbness/tingling in the fingertips and toes. 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 The vincristine was dose reduced with cycle 2. The vincristine was discontinued beginning with cycle 3.  Disposition-Ms. Sutliff appears stable. She has completed 4 cycles of CHOP/Rituxan. Restaging PET scan on 06/10/2012 was significantly improved. Plan to proceed with cycle 5 today as scheduled. She will again receive Neulasta support. She will return for a followup visit and cycle 6 in 3 weeks. She will contact the office in the interim with any problems.  Plan reviewed with Dr. Truett Perna.  Lonna Cobb ANP/GNP-BC

## 2012-06-15 NOTE — Telephone Encounter (Signed)
appt made and printed for pt aom °

## 2012-06-16 ENCOUNTER — Ambulatory Visit (HOSPITAL_BASED_OUTPATIENT_CLINIC_OR_DEPARTMENT_OTHER): Payer: Medicare Other

## 2012-06-16 VITALS — BP 104/60 | HR 95 | Temp 98.5°F

## 2012-06-16 DIAGNOSIS — C8510 Unspecified B-cell lymphoma, unspecified site: Secondary | ICD-10-CM

## 2012-06-16 DIAGNOSIS — C8588 Other specified types of non-Hodgkin lymphoma, lymph nodes of multiple sites: Secondary | ICD-10-CM

## 2012-06-16 MED ORDER — PEGFILGRASTIM INJECTION 6 MG/0.6ML
6.0000 mg | Freq: Once | SUBCUTANEOUS | Status: AC
  Administered 2012-06-16: 6 mg via SUBCUTANEOUS
  Filled 2012-06-16: qty 0.6

## 2012-06-17 NOTE — Telephone Encounter (Signed)
Erroneous encounter

## 2012-06-20 ENCOUNTER — Telehealth: Payer: Self-pay | Admitting: Internal Medicine

## 2012-06-20 NOTE — Telephone Encounter (Signed)
Called pt and told her that Dr Lovell Sheehan is unavailable and the he recommends pt to see [name]. Pt refused to see NP and req to see Dr [name] since pcp not avail. Pt has been schd ov with Dr [name] as noted.

## 2012-06-20 NOTE — Telephone Encounter (Signed)
Dr Lovell Sheehan has nothing available--please let pt know he is cutting back seeing pt and he would like her to see padonda

## 2012-06-20 NOTE — Telephone Encounter (Signed)
Pt called and said that she was instructed by Dr Lovell Sheehan to sch an ov with him in Dec after completing Chemo, or for Dr Lovell Sheehan to call pt on phone. Pls call.

## 2012-07-05 ENCOUNTER — Other Ambulatory Visit: Payer: Self-pay | Admitting: Oncology

## 2012-07-06 ENCOUNTER — Telehealth: Payer: Self-pay | Admitting: Oncology

## 2012-07-06 ENCOUNTER — Ambulatory Visit (HOSPITAL_BASED_OUTPATIENT_CLINIC_OR_DEPARTMENT_OTHER): Payer: Medicare Other | Admitting: Nurse Practitioner

## 2012-07-06 ENCOUNTER — Ambulatory Visit (HOSPITAL_BASED_OUTPATIENT_CLINIC_OR_DEPARTMENT_OTHER): Payer: Medicare Other

## 2012-07-06 ENCOUNTER — Other Ambulatory Visit (HOSPITAL_BASED_OUTPATIENT_CLINIC_OR_DEPARTMENT_OTHER): Payer: Medicare Other | Admitting: Lab

## 2012-07-06 VITALS — BP 104/62 | HR 95 | Temp 97.6°F | Resp 20 | Ht 64.0 in | Wt 184.5 lb

## 2012-07-06 VITALS — BP 90/54 | HR 94 | Temp 97.1°F | Resp 20

## 2012-07-06 DIAGNOSIS — C8588 Other specified types of non-Hodgkin lymphoma, lymph nodes of multiple sites: Secondary | ICD-10-CM

## 2012-07-06 DIAGNOSIS — Z5112 Encounter for antineoplastic immunotherapy: Secondary | ICD-10-CM

## 2012-07-06 DIAGNOSIS — Z5111 Encounter for antineoplastic chemotherapy: Secondary | ICD-10-CM

## 2012-07-06 DIAGNOSIS — C8589 Other specified types of non-Hodgkin lymphoma, extranodal and solid organ sites: Secondary | ICD-10-CM

## 2012-07-06 DIAGNOSIS — I4891 Unspecified atrial fibrillation: Secondary | ICD-10-CM

## 2012-07-06 DIAGNOSIS — C8510 Unspecified B-cell lymphoma, unspecified site: Secondary | ICD-10-CM

## 2012-07-06 LAB — COMPREHENSIVE METABOLIC PANEL (CC13)
ALT: 24 U/L (ref 0–55)
AST: 26 U/L (ref 5–34)
CO2: 21 mEq/L — ABNORMAL LOW (ref 22–29)
Calcium: 9.3 mg/dL (ref 8.4–10.4)
Chloride: 105 mEq/L (ref 98–107)
Creatinine: 1 mg/dL (ref 0.6–1.1)
Sodium: 139 mEq/L (ref 136–145)
Total Bilirubin: 0.51 mg/dL (ref 0.20–1.20)
Total Protein: 6.5 g/dL (ref 6.4–8.3)

## 2012-07-06 LAB — CBC WITH DIFFERENTIAL/PLATELET
BASO%: 0.6 % (ref 0.0–2.0)
Eosinophils Absolute: 0 10*3/uL (ref 0.0–0.5)
HCT: 34 % — ABNORMAL LOW (ref 34.8–46.6)
HGB: 10.8 g/dL — ABNORMAL LOW (ref 11.6–15.9)
LYMPH%: 12 % — ABNORMAL LOW (ref 14.0–49.7)
MCHC: 31.8 g/dL (ref 31.5–36.0)
MONO#: 0.7 10*3/uL (ref 0.1–0.9)
NEUT#: 3.7 10*3/uL (ref 1.5–6.5)
NEUT%: 73 % (ref 38.4–76.8)
Platelets: 221 10*3/uL (ref 145–400)
WBC: 5 10*3/uL (ref 3.9–10.3)
lymph#: 0.6 10*3/uL — ABNORMAL LOW (ref 0.9–3.3)

## 2012-07-06 LAB — PROTIME-INR: INR: 2.1 (ref 2.00–3.50)

## 2012-07-06 MED ORDER — DIPHENHYDRAMINE HCL 25 MG PO CAPS
50.0000 mg | ORAL_CAPSULE | Freq: Once | ORAL | Status: AC
  Administered 2012-07-06: 50 mg via ORAL

## 2012-07-06 MED ORDER — SODIUM CHLORIDE 0.9 % IV SOLN
Freq: Once | INTRAVENOUS | Status: AC
  Administered 2012-07-06: 11:00:00 via INTRAVENOUS

## 2012-07-06 MED ORDER — RITUXIMAB CHEMO INJECTION 10 MG/ML
375.0000 mg/m2 | Freq: Once | INTRAVENOUS | Status: AC
  Administered 2012-07-06: 800 mg via INTRAVENOUS
  Filled 2012-07-06: qty 80

## 2012-07-06 MED ORDER — HEPARIN SOD (PORK) LOCK FLUSH 100 UNIT/ML IV SOLN
500.0000 [IU] | Freq: Once | INTRAVENOUS | Status: AC | PRN
  Administered 2012-07-06: 500 [IU]
  Filled 2012-07-06: qty 5

## 2012-07-06 MED ORDER — ACETAMINOPHEN 325 MG PO TABS
650.0000 mg | ORAL_TABLET | Freq: Once | ORAL | Status: AC
  Administered 2012-07-06: 650 mg via ORAL

## 2012-07-06 MED ORDER — ONDANSETRON 16 MG/50ML IVPB (CHCC)
16.0000 mg | Freq: Once | INTRAVENOUS | Status: AC
  Administered 2012-07-06: 16 mg via INTRAVENOUS

## 2012-07-06 MED ORDER — SODIUM CHLORIDE 0.9 % IV SOLN
600.0000 mg/m2 | Freq: Once | INTRAVENOUS | Status: AC
  Administered 2012-07-06: 1200 mg via INTRAVENOUS
  Filled 2012-07-06: qty 60

## 2012-07-06 MED ORDER — DOXORUBICIN HCL CHEMO IV INJECTION 2 MG/ML
40.0000 mg/m2 | Freq: Once | INTRAVENOUS | Status: AC
  Administered 2012-07-06: 80 mg via INTRAVENOUS
  Filled 2012-07-06: qty 40

## 2012-07-06 MED ORDER — SODIUM CHLORIDE 0.9 % IJ SOLN
10.0000 mL | INTRAMUSCULAR | Status: DC | PRN
  Administered 2012-07-06: 10 mL
  Filled 2012-07-06: qty 10

## 2012-07-06 NOTE — Progress Notes (Signed)
OFFICE PROGRESS NOTE  Interval history:  [name] returns as scheduled. She completed cycle 5 CHOP/Rituxan on 06/15/2012 with Neulasta support. She denies nausea/vomiting. No mouth sores. Bowels continue to alternate constipation and diarrhea. This predated the start of chemotherapy. She has a good appetite. She has occasional sweats. No fever. She has stable numbness in the fingertips and toes. The numbness in the toes predated the start of chemotherapy and is unchanged.   Objective: Blood pressure 104/62, pulse 95, temperature 97.6 F (36.4 C), temperature source Oral, resp. rate 20, height 5\' 4"  (1.626 m), weight 184 lb 8 oz (83.689 kg).  Oropharynx is without thrush or ulceration. No palpable cervical or subclavicular lymph nodes. Lungs are clear. Regular cardiac rhythm. Port-A-Cath site is without erythema. Abdomen is soft and nontender. No organomegaly.  Trace lower leg edema bilaterally. Calves are soft and nontender. Vibratory sense is mildly decreased over the fingertips per tuning fork exam.  Lab Results: Lab Results  Component Value Date   WBC 5.0 07/06/2012   HGB 10.8* 07/06/2012   HCT 34.0* 07/06/2012   MCV 95.2 07/06/2012   PLT 221 07/06/2012    Chemistry:    Chemistry      Component Value Date/Time   NA 139 06/15/2012 0903   NA 133* 03/30/2012 0630   K 4.4 06/15/2012 0903   K 3.7 03/30/2012 0630   CL 107 06/15/2012 0903   CL 99 03/30/2012 0630   CO2 25 06/15/2012 0903   CO2 26 03/30/2012 0630   BUN 12.0 06/15/2012 0903   BUN 9 03/30/2012 0630   CREATININE 0.9 06/15/2012 0903   CREATININE 0.71 03/30/2012 0630      Component Value Date/Time   CALCIUM 9.7 06/15/2012 0903   CALCIUM 8.5 03/30/2012 0630   ALKPHOS 69 06/15/2012 0903   ALKPHOS 80 03/27/2012 1810   AST 22 06/15/2012 0903   AST 24 03/27/2012 1810   ALT 24 06/15/2012 0903   ALT 27 03/27/2012 1810   BILITOT 0.54 06/15/2012 0903   BILITOT 0.6 03/27/2012 1810       Studies/Results: Nm Pet Image Restag (ps)  Skull Base To Thigh  06/10/2012  *RADIOLOGY REPORT*  Clinical Data: Subsequent treatment strategy for large B-cell lymphoma.  NUCLEAR MEDICINE PET SKULL BASE TO THIGH  Fasting Blood Glucose:  110  Technique:  17.3 mCi F-18 FDG was injected intravenously. CT data was obtained and used for attenuation correction and anatomic localization only.  (This was not acquired as a diagnostic CT examination.) Additional exam technical data entered on technologist worksheet.  Comparison:  Multiple exams, including 02/25/2012  Findings:  Neck: No significant abnormal hypermetabolic activity in the neck. The carotid arteries are noted to extend in the retropharyngeal region just above the level of the pharyngoesophageal junction.  Chest:  Prior extensive right hilar and infrahilar hypermetabolic adenopathy has resolved.  Band-like density favoring atelectasis or scarring in the left upper lobe demonstrates slight hypermetabolic activity, maximum standard uptake value of 3.3.  Abdomen/Pelvis:  The porta hepatis node maximum standard uptake value is 3.5 (formerly 37.1).  This node previously measured 1.8 cm in short axis and currently measures 1.0 cm in short axis.  The hypermetabolic retroperitoneal adenopathy shown previously has resolved.  The hypermetabolic pelvic adenopathy shown previously has resolved.  Incidental gallstone and atherosclerosis noted. Anorectal activity is probably physiologic.  Skeleton:  Diffusely increased skeletal activity is probably related to marrow stimulation.  IMPRESSION:  1.  Markedly improved, with resolution of the thoracic adenopathy, resolution of  the retroperitoneal adenopathy, and near complete resolution of the porta hepatis adenopathy. 2.  Mild atelectasis in the left upper lobe adjacent to the minor fissure. 3.  Cholelithiasis. 4.  Atherosclerosis. 5.  Diffuse marrow activity, likely from marrow stimulation therapy.   Original Report Authenticated By: Gaylyn Rong, M.D.      Medications: I have reviewed the patient's current medications.  Assessment/Plan:  1. Non-Hodgkin's lymphoma, large B-cell lymphoma, CD20 positive involving 8 needle core biopsy of a right iliac lymph node 02/12/2012. CT of the abdomen and pelvis on 01/15/2012 showed abdominal/pelvic lymphadenopathy. Serum LDH in normal range on 02/25/2012. Staging PET scan 02/25/2012 with a small lymph node posteriolateral to the oropharynx on the left measuring 9 x 7 mm with SUV 5.4; large cluster of hypermetabolic lymph nodes in the right hilum with SUV 4.3; cluster of mildly enlarged hepatoduodenal ligament lymph nodes and mildly enlarged portacaval lymph node with hypermetabolic activity (SUV max 37.1); numerous borderline enlarged and mildly enlarged periaortic retroperitoneal lymph nodes with extensive hypermetabolic activity (SUV max 43.6-47.6); numerous borderline enlarged and mildly enlarged hypermetabolic lymph nodes along the pelvic sidewall bilaterally most pronounced on the right with a right external iliac lymph node measuring 2.9 x 4.1 cm (SUV max 52.5). Bone marrow aspiration/biopsy 03/09/2012 negative for involvement with lymphoma. Status post cycle 1 of CHOP-Rituxan on 03/15/2012. She completed cycle 3 on 05/04/2012 and cycle 4 on 05/25/2012. Restaging PET scan on 06/10/2012 was markedly improved with resolution of the thoracic and retroperitoneal adenopathy and near complete resolution of porta hepatis adenopathy. She completed cycle 5 CHOP/Rituxan on 06/15/2012. 2. History of night sweats likely secondary to #1. 3. Rectal bleeding June 2013 status post negative colonoscopy 01/28/2012 and upper endoscopy 02/11/2012. The bleeding was likely related to hemorrhoids. 4. COPD. 5. Remote history of larynx cancer. 6. Status post Port-A-Cath placement 03/09/2012. 7. 2-D echo 03/11/2012 with left ventricular ejection fraction 60-65%. 8. Admission with febrile neutropenia 03/22/2012 with no source for  infection identified 9. Admission with fever and a chest x-ray consistent with "pneumonia"on 03/27/2012. She completed a course of Levaquin. 10. Pancytopenia following cycle 1 of CHOP-rituximab-resolved. 11. History of atrial fibrillation-maintained on Coumadin. The INR is therapeutic. She will continue Coumadin at the current dose. 12. Numbness/tingling in the fingertips and toes. The numbness in the toes predated the start of chemotherapy and is unchanged. The vincristine was dose reduced with cycle 2. Vincristine was discontinued beginning with cycle 3.  Disposition-[name] appears stable. Plan to proceed with the sixth and final cycle of CHOP/Rituxan today as scheduled. She will again receive Neulasta support. She will return for a followup visit on 08/08/2012. She will contact the office in the interim with any problems.  Plan reviewed with Dr. Truett Perna.  Lonna Cobb ANP/GNP-BC

## 2012-07-06 NOTE — Patient Instructions (Signed)
[facility] Discharge Instructions for Patients Receiving Chemotherapy  Today you received the following chemotherapy agents Adriamycin, Cytoxan and Rituxan  To help prevent nausea and vomiting after your treatment, we encourage you to take your nausea medication as prescribed.   If you develop nausea and vomiting that is not controlled by your nausea medication, call the clinic. If it is after clinic hours your family physician or the after hours number for the clinic or go to the Emergency Department.   BELOW ARE SYMPTOMS THAT SHOULD BE REPORTED IMMEDIATELY:  *FEVER GREATER THAN 100.5 F  *CHILLS WITH OR WITHOUT FEVER  NAUSEA AND VOMITING THAT IS NOT CONTROLLED WITH YOUR NAUSEA MEDICATION  *UNUSUAL SHORTNESS OF BREATH  *UNUSUAL BRUISING OR BLEEDING  TENDERNESS IN MOUTH AND THROAT WITH OR WITHOUT PRESENCE OF ULCERS  *URINARY PROBLEMS  *BOWEL PROBLEMS  UNUSUAL RASH Items with * indicate a potential emergency and should be followed up as soon as possible.  Feel free to call the clinic you have any questions or concerns. The clinic phone number is [phone].   I have been informed and understand all the instructions given to me. I know to contact the clinic, my physician, or go to the Emergency Department if any problems should occur. I do not have any questions at this time, but understand that I may call the clinic during office hours   should I have any questions or need assistance in obtaining follow up care.

## 2012-07-06 NOTE — Telephone Encounter (Signed)
gv and printed appt schedule for pt for Dec and Jan 2014 ° °

## 2012-07-07 ENCOUNTER — Ambulatory Visit (HOSPITAL_BASED_OUTPATIENT_CLINIC_OR_DEPARTMENT_OTHER): Payer: Medicare Other

## 2012-07-07 VITALS — BP 88/49 | HR 97 | Temp 98.0°F

## 2012-07-07 DIAGNOSIS — C8510 Unspecified B-cell lymphoma, unspecified site: Secondary | ICD-10-CM

## 2012-07-07 DIAGNOSIS — C8588 Other specified types of non-Hodgkin lymphoma, lymph nodes of multiple sites: Secondary | ICD-10-CM

## 2012-07-07 MED ORDER — PEGFILGRASTIM INJECTION 6 MG/0.6ML
6.0000 mg | Freq: Once | SUBCUTANEOUS | Status: AC
  Administered 2012-07-07: 6 mg via SUBCUTANEOUS
  Filled 2012-07-07: qty 0.6

## 2012-07-08 ENCOUNTER — Other Ambulatory Visit: Payer: Self-pay | Admitting: Internal Medicine

## 2012-07-13 ENCOUNTER — Ambulatory Visit (INDEPENDENT_AMBULATORY_CARE_PROVIDER_SITE_OTHER): Payer: Medicare Other | Admitting: Family Medicine

## 2012-07-13 ENCOUNTER — Encounter: Payer: Self-pay | Admitting: Family Medicine

## 2012-07-13 VITALS — BP 130/64 | Temp 98.2°F | Wt 185.0 lb

## 2012-07-13 DIAGNOSIS — I10 Essential (primary) hypertension: Secondary | ICD-10-CM

## 2012-07-13 DIAGNOSIS — I4891 Unspecified atrial fibrillation: Secondary | ICD-10-CM

## 2012-07-13 DIAGNOSIS — L02619 Cutaneous abscess of unspecified foot: Secondary | ICD-10-CM

## 2012-07-13 DIAGNOSIS — L03039 Cellulitis of unspecified toe: Secondary | ICD-10-CM

## 2012-07-13 MED ORDER — CEPHALEXIN 500 MG PO CAPS: 500.0000 mg | ORAL_CAPSULE | Freq: Three times a day (TID) | ORAL | Status: DC

## 2012-07-13 MED ORDER — VITAMIN D (ERGOCALCIFEROL) 1.25 MG (50000 UNIT) PO CAPS: 50000.0000 [IU] | ORAL_CAPSULE | ORAL | Status: DC

## 2012-07-13 MED ORDER — MUPIROCIN 2 % EX OINT: TOPICAL_OINTMENT | Freq: Three times a day (TID) | CUTANEOUS | Status: DC

## 2012-07-13 NOTE — Progress Notes (Signed)
  Subjective:    Patient ID: [name], female    DOB: 1927-09-16, 78 y.o.   MRN: [id number]  HPI  Patient seen with left great toe redness and pain. She has history of large B-cell lymphoma and just finished chemotherapy recently. She denies recent fever. She apparently clipped her nail few weeks ago cutting nail too low and had abrasion afterwards. She's noted some slight increased redness over the past week. Again no fevers or chills. No nausea or vomiting. She also has history of reported peripheral neuropathy. No history of diabetes. She has not been treating her toe topically. She has an abrasion distal aspect of the toe. Recent white blood count 5.0.   Review of Systems  Constitutional: Negative for fever and chills.  Hematological: Negative for adenopathy.       Objective:   Physical Exam  Constitutional: She appears well-developed and well-nourished.  Cardiovascular: Normal rate and regular rhythm.   Pulmonary/Chest: Effort normal and breath sounds normal. No respiratory distress. She has no wheezes. She has no rales.  Skin:       Left great toe reveals small abrasion just distal to the nail that was clipped recently. She has mild surrounding erythema but no warmth. Slightly tender to touch. No fluctuance. No pustules. No foot edema or erythema          Assessment & Plan:  Left great toe abrasion with early cellulitis. Warm soaks several times daily. Keflex 500 mg 3 times a day for 7 days. Bactroban topically twice 3 times daily. Patient plans to start getting protimes monitored here. Previously at [facility] Center. She will schedule for early January.

## 2012-07-13 NOTE — Patient Instructions (Addendum)
Consider salt water soaks for toe two times daily for the next several days. Follow up promptly for any fever or worsening redness or swelling.

## 2012-07-21 ENCOUNTER — Telehealth: Payer: Self-pay | Admitting: *Deleted

## 2012-07-21 ENCOUNTER — Ambulatory Visit (HOSPITAL_BASED_OUTPATIENT_CLINIC_OR_DEPARTMENT_OTHER): Payer: Medicare Other | Admitting: Oncology

## 2012-07-21 ENCOUNTER — Other Ambulatory Visit: Payer: Self-pay | Admitting: *Deleted

## 2012-07-21 ENCOUNTER — Ambulatory Visit (HOSPITAL_BASED_OUTPATIENT_CLINIC_OR_DEPARTMENT_OTHER): Payer: Medicare Other | Admitting: Lab

## 2012-07-21 VITALS — BP 104/62 | HR 96 | Temp 97.7°F | Resp 20 | Ht 64.0 in | Wt 181.3 lb

## 2012-07-21 DIAGNOSIS — R509 Fever, unspecified: Secondary | ICD-10-CM

## 2012-07-21 DIAGNOSIS — C8588 Other specified types of non-Hodgkin lymphoma, lymph nodes of multiple sites: Secondary | ICD-10-CM

## 2012-07-21 DIAGNOSIS — C8510 Unspecified B-cell lymphoma, unspecified site: Secondary | ICD-10-CM

## 2012-07-21 DIAGNOSIS — R5381 Other malaise: Secondary | ICD-10-CM

## 2012-07-21 DIAGNOSIS — R197 Diarrhea, unspecified: Secondary | ICD-10-CM

## 2012-07-21 DIAGNOSIS — C8589 Other specified types of non-Hodgkin lymphoma, extranodal and solid organ sites: Secondary | ICD-10-CM

## 2012-07-21 DIAGNOSIS — R5383 Other fatigue: Secondary | ICD-10-CM

## 2012-07-21 LAB — CBC WITH DIFFERENTIAL/PLATELET
BASO%: 0.3 % (ref 0.0–2.0)
LYMPH%: 3.1 % — ABNORMAL LOW (ref 14.0–49.7)
MCHC: 31.8 g/dL (ref 31.5–36.0)
MONO#: 0.7 10*3/uL (ref 0.1–0.9)
RBC: 3.43 10*6/uL — ABNORMAL LOW (ref 3.70–5.45)
RDW: 14.3 % (ref 11.2–14.5)
WBC: 6.5 10*3/uL (ref 3.9–10.3)
lymph#: 0.2 10*3/uL — ABNORMAL LOW (ref 0.9–3.3)

## 2012-07-21 NOTE — Patient Instructions (Signed)
Call for fever >101, increased diarrhea, shaking chills

## 2012-07-21 NOTE — Telephone Encounter (Signed)
Received message from daughter pt having chills.  Called and spoke with pt's husband; states pt has temp 100.6 and shaking/chills all over; has been going on for 3-4 days.  Denies N/V/pain.  Not taking in a lot of food/fluids; reports had diarrhea Tuesday 12/17 2-3 times but yesterday and today have been normal BM.  Has been on Keflex for toe infection from PCP and today will be last day taking medication.  Note to Dr. Truett Perna.   Called and spoke with pt's husband and told him to come in now; MD can see at 12noon.  Pt's husband verbalized understanding.

## 2012-07-21 NOTE — Progress Notes (Signed)
[facility]    OFFICE PROGRESS NOTE   INTERVAL HISTORY:   She completed another cycle of CHOP/rituximab on 07/06/2012. She received Neulasta on 07/07/2012. She reports a seven-day day history of intermittent low-grade fever, intermittent diarrhea, and malaise. She had an episode of "shaking "of the head and extremities when she was shopping earlier this week. The left great toe was "infected "last week and she is completing a course of Keflex. The toe is no longer painful. Mild cough.  Objective:  Vital signs in last 24 hours:  Blood pressure 104/62, pulse 120, temperature 99.8 F (37.7 C), temperature source Oral, resp. rate 20, height 5\' 4"  (1.626 m), weight 181 lb 4.8 oz (82.237 kg).    HEENT: No thrush or ulcers Resp: End inspiratory rhonchi at the left greater than right base, good air movement bilaterally, no respiratory distress Cardio: Regular rate and rhythm GI: No hepatosplenomegaly, nontender Vascular: No leg edema  Skin: Examination of the left great toe reveals no discharge, tenderness, or erythema  Neurologic: Finger to nose testing is normal, mild resting tremor of both hands, ataxic gait  Portacath/PICC-without erythema  Lab Results:  Lab Results  Component Value Date   WBC 6.5 07/21/2012   HGB 10.3* 07/21/2012   HCT 32.4* 07/21/2012   MCV 94.5 07/21/2012   PLT 249 07/21/2012   ANC 5.6    Medications: I have reviewed the patient's current medications.  Assessment/Plan: 1. Non-Hodgkin's lymphoma, large B-cell lymphoma, CD20 positive involving 8 needle core biopsy of a right iliac lymph node 02/12/2012. CT of the abdomen and pelvis on 01/15/2012 showed abdominal/pelvic lymphadenopathy. Serum LDH in normal range on 02/25/2012. Staging PET scan 02/25/2012 with a small lymph node posteriolateral to the oropharynx on the left measuring 9 x 7 mm with SUV 5.4; large cluster of hypermetabolic lymph nodes in the right hilum with SUV 4.3; cluster of  mildly enlarged hepatoduodenal ligament lymph nodes and mildly enlarged portacaval lymph node with hypermetabolic activity (SUV max 37.1); numerous borderline enlarged and mildly enlarged periaortic retroperitoneal lymph nodes with extensive hypermetabolic activity (SUV max 43.6-47.6); numerous borderline enlarged and mildly enlarged hypermetabolic lymph nodes along the pelvic sidewall bilaterally most pronounced on the right with a right external iliac lymph node measuring 2.9 x 4.1 cm (SUV max 52.5). Bone marrow aspiration/biopsy 03/09/2012 negative for involvement with lymphoma. Status post cycle 1 of CHOP-Rituxan on 03/15/2012. She completed cycle 3 on 05/04/2012 and cycle 4 on 05/25/2012. Restaging PET scan on 06/10/2012 was markedly improved with resolution of the thoracic and retroperitoneal adenopathy and near complete resolution of porta hepatis adenopathy. She completed cycle 6 of CHOP/Rituxan on 07/06/2012. 2. History of night sweats likely secondary to #1. 3. Rectal bleeding June 2013 status post negative colonoscopy 01/28/2012 and upper endoscopy 02/11/2012. The bleeding was likely related to hemorrhoids. 4. COPD. 5. Remote history of larynx cancer. 6. Status post Port-A-Cath placement 03/09/2012. 7. 2-D echo 03/11/2012 with left ventricular ejection fraction 60-65%. 8. Admission with febrile neutropenia 03/22/2012 with no source for infection identified 9. Admission with fever and a chest x-ray consistent with "pneumonia"on 03/27/2012. She completed a course of Levaquin. 10. Pancytopenia following cycle 1 of CHOP-rituximab-resolved. 11. History of atrial fibrillation-maintained on Coumadin.  12. Numbness/tingling in the fingertips and toes. The numbness in the toes predated the start of chemotherapy and is unchanged. The vincristine was dose reduced with cycle 2. Vincristine was discontinued beginning with cycle 3. 13. Balance difficulty-the patient and family report this has been a  chronic  problem. I have a low clinical suspicion for neurotoxicity from the chemotherapy or CNS lymphoma. She will followup with her primary physician to evaluate the bowels difficulty. 14. Low-grade fever, diarrhea, and malaise-? Viral infection. I have a low suspicion for pneumonia or a bacterial infection   Disposition:  She presents for an unscheduled visit. I suspect the low-grade fever, diarrhea, and malaise could be related to a viral infection. It is possible her symptoms are related to the recent chemotherapy. The neurologic symptoms are most likely not related to non-Hodgkin's lymphoma or chemotherapy. She will followup with her primary physician to evaluate the tremor and balance difficulty. She will contact us for a high fever or shaking chills. Ms. Test will return for an office visit as scheduled on 08/08/2012.   Thornton Papas, MD  07/21/2012  12:35 PM

## 2012-07-25 ENCOUNTER — Other Ambulatory Visit: Payer: Medicare Other | Admitting: Lab

## 2012-08-04 ENCOUNTER — Ambulatory Visit (INDEPENDENT_AMBULATORY_CARE_PROVIDER_SITE_OTHER): Payer: Medicare Other | Admitting: Family

## 2012-08-04 ENCOUNTER — Ambulatory Visit (INDEPENDENT_AMBULATORY_CARE_PROVIDER_SITE_OTHER): Payer: Medicare Other | Admitting: Family Medicine

## 2012-08-04 VITALS — BP 90/60 | HR 113 | Wt 177.0 lb

## 2012-08-04 DIAGNOSIS — K529 Noninfective gastroenteritis and colitis, unspecified: Secondary | ICD-10-CM

## 2012-08-04 DIAGNOSIS — I4891 Unspecified atrial fibrillation: Secondary | ICD-10-CM

## 2012-08-04 DIAGNOSIS — K5289 Other specified noninfective gastroenteritis and colitis: Secondary | ICD-10-CM

## 2012-08-04 LAB — POCT INR: INR: 1.7

## 2012-08-04 NOTE — Patient Instructions (Addendum)
Take and extra 1/2 tab today only. Same dose, 7.5 mg everyday, check in 3 weeks    Latest dosing instructions   Total Sun Mon Tue Wed Thu Fri Sat   52.5 7.5 mg 7.5 mg 7.5 mg 7.5 mg 7.5 mg 7.5 mg 7.5 mg    (5 mg1.5) (5 mg1.5) (5 mg1.5) (5 mg1.5) (5 mg1.5) (5 mg1.5) (5 mg1.5)

## 2012-08-04 NOTE — Patient Instructions (Addendum)
-  drink plenty of fluids - (do not use artificial sweeteners or drinks with artificial sweeteners) -  soup broth and water are good  -no dairy for the next week  -you can use the loperamide as instructed on the box  -used anti-nausea medicine if needed according to instruction  -Follow up with Dr. Alcide Evener as scheduled  -if not improved in 2 weeks or if worsening see your doctor

## 2012-08-04 NOTE — Progress Notes (Signed)
Chief Complaint  Patient presents with  . Diarrhea    lightheaded, weakness, loss of balance    HPI:  Acute visit for Diarrhea: -started 4-5 days ago -symptoms: watery diarrhea - every day last few days - watery with a few episodes per day, nausea, anorexia, weakness, runny nose -denies: fevers, abd pain, dysuria, blood in stools, SOB, body aches -has tried: has tried loperamide today - helps a little -sees Dr. Alcide Evener in a few days - had chemo Dec 4th and hasn't felt great since then, had labs 2 weeks ago with blood counts good -her whole family has had a GI bug  ROS: See pertinent positives and negatives per HPI.  Past Medical History  Diagnosis Date  . GERD (gastroesophageal reflux disease)   . Arthritis   . Stroke 10/2002    no residual  . History of TIAs   . Diverticulosis   . PUD (peptic ulcer disease)   . PVD (peripheral vascular disease)   . Hyperlipidemia   . Anxiety   . Depression   . 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 Blood transfusion   . COPD (chronic obstructive pulmonary disease)   . Lymphona, mantle cell, inguinal region/lower limb     Family History  Problem Relation Age of Onset  . Colon cancer Sister     colon  . Hyperlipidemia Mother   . Stroke Mother   . Cancer Father     mesothelioma    History   Social History  . Marital Status: Married    Spouse Name: N/A    Number of Children: 6  . Years of Education: N/A   Occupational History  . retired    Social History Main Topics  . Smoking status: Former Games developer  . Smokeless tobacco: Never Used  . Alcohol Use: No  . Drug Use: No  . Sexually Active: Yes   Other Topics Concern  . Not on file   Social History Narrative  . No narrative on file    Current outpatient prescriptions:ALPRAZolam (XANAX) 0.5 MG tablet, Take 1 tablet (0.5 mg total) by mouth 3 (three) times daily as needed for sleep., Disp: 90 tablet, Rfl: 3;  Alum & Mag  Hydroxide-Simeth (MAGIC MOUTHWASH) SOLN, Take 10 mLs by mouth every 4 (four) hours as needed. Swish and swallow.  Not within two hours of cipro, avelox or tetracycline, Disp: 240 mL, Rfl: 0;  bisoprolol (ZEBETA) 5 MG tablet, Take 5 mg by mouth daily. , Disp: , Rfl:  cephALEXin (KEFLEX) 500 MG capsule, Take 1 capsule (500 mg total) by mouth 3 (three) times daily., Disp: 21 capsule, Rfl: 0;  clotrimazole-betamethasone (LOTRISONE) lotion, Apply topically as needed. , Disp: , Rfl: ;  DULoxetine (CYMBALTA) 30 MG capsule, Take 1 capsule (30 mg total) by mouth daily., Disp: , Rfl: 3;  hydrocortisone-pramoxine (ANALPRAM-HC) 2.5-1 % rectal cream, Place rectally 3 (three) times daily., Disp: 30 g, Rfl: 0 lidocaine-prilocaine (EMLA) cream, Apply to port-a-cath site 1-2 hours prior to chemo.  Cover with plastic wrap, Disp: 30 g, Rfl: 1;  mupirocin ointment (BACTROBAN) 2 %, Apply topically 3 (three) times daily., Disp: 22 g, Rfl: 0;  predniSONE (DELTASONE) 20 MG tablet, TAKE THREE TABLETS BY MOUTH EVERY DAY FOR 5 DAYS-BEGINNING DAY ONE OF CHEMO, Disp: 15 tablet, Rfl: 3 predniSONE (DELTASONE) 20 MG tablet, Take 60 mg by mouth daily. Take 60mg  daily for 5 days with each chemotherapy cycle, Disp: , Rfl: ;  prochlorperazine (COMPAZINE) 10 MG tablet, Take 10 mg by mouth every 6 (six) hours as needed. For nausea., Disp: , Rfl: ;  tiotropium (SPIRIVA) 18 MCG inhalation capsule, Place 18 mcg into inhaler and inhale daily., Disp: , Rfl:  Vitamin D, Ergocalciferol, (DRISDOL) 50000 UNITS CAPS, Take 1 capsule (50,000 Units total) by mouth every 7 (seven) days. Taken on Sundays., Disp: 30 capsule, Rfl: 0;  warfarin (COUMADIN) 2.5 MG tablet, Take 5 mg by mouth daily. Take 2 tabs by mouth daily, Disp: , Rfl:   EXAM:  Filed Vitals:   08/04/12 0857  BP: 90/60  Pulse: 113  P 98 on my exam  There is no height on file to calculate BMI.  GENERAL: vitals reviewed and listed above, alert, oriented, appears well hydrated and in no acute  distress  HEENT: atraumatic, conjunttiva clear, no obvious abnormalities on inspection of external nose and ears  NECK: no obvious masses on inspection  LUNGS: clear to auscultation bilaterally, no wheezes, rales or rhonchi, good air movement  CV: HRRR, mild tachy  ABD: BS +, soft, NTTP, o CVA TTP  MS: moves all extremities without noticeable abnormality  PSYCH: pleasant and cooperative, no obvious depression or anxiety  ASSESSMENT AND PLAN:  Discussed the following assessment and plan:  1. Gastroenteritis    -likely viral with GI bug in multiple family members recently, benign exam mildly dehydrated likely, though pt and husband report low BP and mild tachy not unusual for her  -advised oral rehydration, supportive care and discussed return precuations -Patient advised to return or notify a doctor immediately if symptoms worsen or persist or new concerns arise.  Patient Instructions  -drink plenty of fluids - (do not use artificial sweeteners or drinks with artificial sweeteners) -  soup broth and water are good  -no dairy for the next week  -you can use the loperamide as instructed on the box  -used anti-nausea medicine if needed according to instruction  -Follow up with Dr. Alcide Evener as scheduled  -if not improved in 2 weeks or if worsening see your doctor     [name].

## 2012-08-05 ENCOUNTER — Telehealth: Payer: Self-pay | Admitting: Internal Medicine

## 2012-08-05 NOTE — Telephone Encounter (Signed)
Clarification needed on coumadin dosage. Pt's husband states that pt was taking 5mg  daily, therefore that extra tab last night gave her 7.5 mg and not 10mg . Was pt previous dosage 5mg  qd or 7.5mg  qd?

## 2012-08-05 NOTE — Telephone Encounter (Signed)
Pt's husband aware and verbalized understanding

## 2012-08-05 NOTE — Telephone Encounter (Signed)
Caller: Edward/Spouse; Phone: [phone]; Reason for Call: Patient and spouse are confused about the directions for Warfarin after receiving results from PT/INR.  She was told to take an extra 1/2 pill last night, which was done.  The other instructions say to take 7.5mg  per day until next check in 3 weeks.  Patient is currently on 5mg  daily instead of 7.5mg  daily.  Please call patient or spouse back to instruct to take 5mg  or 7.5mg .  Thanks.

## 2012-08-05 NOTE — Telephone Encounter (Signed)
Continue 5mg  a day if that is what she had been taking. My understanding was 7.5mg  a day. However continue 5mg  a day. Recheck as discussed.

## 2012-08-08 ENCOUNTER — Other Ambulatory Visit: Payer: Medicare Other | Admitting: Lab

## 2012-08-08 ENCOUNTER — Ambulatory Visit: Payer: Medicare Other | Admitting: Oncology

## 2012-08-12 ENCOUNTER — Telehealth: Payer: Self-pay | Admitting: Oncology

## 2012-08-12 NOTE — Telephone Encounter (Signed)
Talked to patient's husband and gave him appt for 2/3 with ML with labs, appt r/s from 1/6 due to MD's unforseen airport delay

## 2012-08-17 ENCOUNTER — Encounter: Payer: Self-pay | Admitting: Family Medicine

## 2012-08-17 ENCOUNTER — Ambulatory Visit (INDEPENDENT_AMBULATORY_CARE_PROVIDER_SITE_OTHER): Payer: Medicare Other | Admitting: Family Medicine

## 2012-08-17 VITALS — BP 140/80 | Temp 97.5°F | Wt 181.0 lb

## 2012-08-17 DIAGNOSIS — H1045 Other chronic allergic conjunctivitis: Secondary | ICD-10-CM

## 2012-08-17 DIAGNOSIS — H1010 Acute atopic conjunctivitis, unspecified eye: Secondary | ICD-10-CM

## 2012-08-17 MED ORDER — OLOPATADINE HCL 0.1 % OP SOLN: 1.0000 [drp] | Freq: Two times a day (BID) | OPHTHALMIC | Status: DC

## 2012-08-17 NOTE — Progress Notes (Signed)
  Subjective:    Patient ID: [name], female    DOB: [birth year], 79 y.o.   MRN: [id number]  HPI  Acute visit  Bilateral pruritus involving both eyes with frequent watering of eyes. Initially started with right eye about 9 days ago and now left is involved. Frequently rubbing eyes. Drainage has been clear. No matting. No visual changes. No eye pain. No nasal congestive symptoms. No fever. Started over-the-counter oral antihistamine yesterday without much improvement Has not tried any eyedrops.  Does not use any facial makeup generally and no recent change of makeup  Past Medical History  Diagnosis Date  . GERD (gastroesophageal reflux disease)   . Arthritis   . Stroke 10/2002    no residual  . History of TIAs   . Diverticulosis   . PUD (peptic ulcer disease)   . PVD (peripheral vascular disease)   . Hyperlipidemia   . Anxiety   . Depression   . 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 Blood transfusion   . COPD (chronic obstructive pulmonary disease)   . Lymphona, mantle cell, inguinal region/lower limb    Past Surgical History  Procedure Date  . Appendectomy   . Colon surgery 09/13/2008    iliostomy for diverticulitis  . Hernia repair   . Tubal ligation   . Throat surgery 1986  . Total knee arthroplasty     2002 left 2008 right  . Reversal of iliostomy 01/2010  . Cataract extraction 2005    bilateral  . Carotid surgery     2004 a month apart right and left    reports that she has quit smoking. She has never used smokeless tobacco. She reports that she does not drink alcohol or use illicit drugs. family history includes Cancer in her father; Colon cancer in her sister; Hyperlipidemia in her mother; and Stroke in her mother. Allergies  Allergen Reactions  . Propoxyphene Hcl   . Propoxyphene-Acetaminophen     REACTION: Reaction not known      Review of Systems  Constitutional: Negative for fever and  chills.  HENT: Negative for congestion.   Eyes: Positive for itching. Negative for photophobia, pain, redness and visual disturbance.       Objective:   Physical Exam  Constitutional: She appears well-developed and well-nourished.  HENT:  Mouth/Throat: Oropharynx is clear and moist.  Eyes: Pupils are equal, round, and reactive to light.       Conjunctivae appear normal. She has minimal clear tearing from both eyes. No significant erythema. She has some very mild erythema around both eyes but no warmth and no tenderness no cellulitis changes. No edema  Cardiovascular: Normal rate.   Pulmonary/Chest: Effort normal and breath sounds normal. No respiratory distress. She has no wheezes. She has no rales.          Assessment & Plan:  Allergic conjunctivitis. Continue oral antihistamine. Trial Patanol eyedrops twice daily. Followup with primary in one week if no better

## 2012-08-17 NOTE — Patient Instructions (Signed)

## 2012-08-19 ENCOUNTER — Ambulatory Visit (INDEPENDENT_AMBULATORY_CARE_PROVIDER_SITE_OTHER): Payer: Self-pay | Admitting: Family

## 2012-08-19 ENCOUNTER — Telehealth: Payer: Self-pay | Admitting: Family

## 2012-08-19 DIAGNOSIS — I4891 Unspecified atrial fibrillation: Secondary | ICD-10-CM

## 2012-08-19 NOTE — Telephone Encounter (Signed)
Patient called and said she gave [place] the wrong dose of Coumadin. It was adjusted by Oncology. She is currently taking 5mg  a day. However, she took 7.5mg  1 day, Jan 2, only. I have advised her to continue 5mg  a day and recheck Jan 23rd. No bleeding or bruising.

## 2012-08-22 ENCOUNTER — Encounter: Payer: Self-pay | Admitting: Family Medicine

## 2012-08-22 ENCOUNTER — Ambulatory Visit (INDEPENDENT_AMBULATORY_CARE_PROVIDER_SITE_OTHER): Payer: Medicare Other | Admitting: Family Medicine

## 2012-08-22 VITALS — BP 110/62 | Temp 98.0°F

## 2012-08-22 DIAGNOSIS — R21 Rash and other nonspecific skin eruption: Secondary | ICD-10-CM

## 2012-08-22 MED ORDER — PREDNISONE 10 MG PO TABS: ORAL_TABLET | ORAL | Status: DC

## 2012-08-22 NOTE — Progress Notes (Signed)
  Subjective:    Patient ID: [name], female    DOB: [birth year], 79 y.o.   MRN: [id number]  HPI Persistent periocular bilateral pruritus. Patient had some onset of symptoms as per previous note and we tried Patanol eyedrops without improvement. She has not seen any redness or significant drainage from her eyes. She's tried cool compresses off and on but not consistently. Symptoms seem to be worse as the day progresses. No visual changes. Does not use makeup around her eyes. She does have history of lymphoma and recently purchased some wigs because of hair loss and wondered if this could have been a trigger. No generalized rash. No fevers or chills.   Review of Systems  Constitutional: Negative for fever and chills.  Eyes: Positive for itching. Negative for pain, discharge, redness and visual disturbance.  Skin: Positive for rash.       Objective:   Physical Exam  Constitutional: She appears well-developed and well-nourished.  Eyes: Conjunctivae normal are normal. Right eye exhibits no discharge. Left eye exhibits no discharge.  Cardiovascular: Normal rate and regular rhythm.   Pulmonary/Chest: Effort normal and breath sounds normal. No respiratory distress. She has no wheezes. She has no rales.  Skin:       Patient has some mild erythema surrounding both eyes. No warmth. No tenderness. Minimal if any edema. Conjunctiva appears normal          Assessment & Plan:  Bilateral periorbital rash. Strongly suspect allergic. This has the appearance of a contact type dermatitis She's tried Patanol eyedrops without improvement. No clear precipitants. Avoid scratching. Cool compresses as needed. Prednisone taper and touch base one week if no better

## 2012-08-22 NOTE — Patient Instructions (Addendum)
Cool compresses for edema or itching. Touch base in one week if no better.

## 2012-08-25 ENCOUNTER — Ambulatory Visit (INDEPENDENT_AMBULATORY_CARE_PROVIDER_SITE_OTHER): Payer: Medicare Other | Admitting: Family

## 2012-08-25 DIAGNOSIS — I4891 Unspecified atrial fibrillation: Secondary | ICD-10-CM

## 2012-08-25 LAB — POCT INR: INR: 1.8

## 2012-08-25 NOTE — Patient Instructions (Addendum)
Continue 5mg  everyday except 7.5mg  on Thursday only. Recheck in 4 weeks.    Latest dosing instructions   Total Sun Mon Tue Wed Thu Fri Sat   37.5 5 mg 5 mg 5 mg 5 mg 7.5 mg 5 mg 5 mg    (5 mg1) (5 mg1) (5 mg1) (5 mg1) (5 mg1.5) (5 mg1) (5 mg1)

## 2012-09-04 ENCOUNTER — Other Ambulatory Visit: Payer: Medicare Other | Admitting: Lab

## 2012-09-05 ENCOUNTER — Other Ambulatory Visit: Payer: Medicare Other | Admitting: Lab

## 2012-09-05 ENCOUNTER — Other Ambulatory Visit (HOSPITAL_BASED_OUTPATIENT_CLINIC_OR_DEPARTMENT_OTHER): Payer: Medicare Other | Admitting: Lab

## 2012-09-05 ENCOUNTER — Telehealth: Payer: Self-pay | Admitting: Oncology

## 2012-09-05 ENCOUNTER — Ambulatory Visit (HOSPITAL_BASED_OUTPATIENT_CLINIC_OR_DEPARTMENT_OTHER): Payer: Medicare Other | Admitting: Nurse Practitioner

## 2012-09-05 VITALS — BP 108/50 | HR 98 | Temp 97.4°F | Resp 18 | Ht 64.0 in | Wt 180.2 lb

## 2012-09-05 DIAGNOSIS — I4891 Unspecified atrial fibrillation: Secondary | ICD-10-CM

## 2012-09-05 DIAGNOSIS — C8510 Unspecified B-cell lymphoma, unspecified site: Secondary | ICD-10-CM

## 2012-09-05 DIAGNOSIS — C8589 Other specified types of non-Hodgkin lymphoma, extranodal and solid organ sites: Secondary | ICD-10-CM

## 2012-09-05 LAB — CBC WITH DIFFERENTIAL/PLATELET
Eosinophils Absolute: 0.2 10*3/uL (ref 0.0–0.5)
MCV: 91.6 fL (ref 79.5–101.0)
MONO%: 9.5 % (ref 0.0–14.0)
NEUT#: 5 10*3/uL (ref 1.5–6.5)
RBC: 4.3 10*6/uL (ref 3.70–5.45)
RDW: 14.3 % (ref 11.2–14.5)
WBC: 6.7 10*3/uL (ref 3.9–10.3)
nRBC: 0 % (ref 0–0)

## 2012-09-05 LAB — PROTIME-INR: Protime: 16.8 Seconds — ABNORMAL HIGH (ref 10.6–13.4)

## 2012-09-05 NOTE — Progress Notes (Signed)
OFFICE PROGRESS NOTE  Interval history:  [name] returns as scheduled. She overall is feeling well. No fevers. She has occasional sweats. She has a good appetite. She is gaining weight. No nausea or vomiting. She has persistent tingling in the fingertips and numbness in the feet both of which she reports were present prior to the chemotherapy. Bowels moving regularly.   Objective: Blood pressure 108/50, pulse 98, temperature 97.4 F (36.3 C), temperature source Oral, resp. rate 18, height 5\' 4"  (1.626 m), weight 180 lb 3.2 oz (81.738 kg).  Oropharynx is without thrush or ulceration. No palpable cervical, supraclavicular, axillary or inguinal lymph nodes. Faint inspiratory rales at both lung bases. No respiratory distress. Regular cardiac rhythm. Port-A-Cath site is without erythema. Abdomen is soft and nontender. No organomegaly. Extremities are without edema.  Lab Results: Lab Results  Component Value Date   WBC 6.7 09/05/2012   HGB 12.4 09/05/2012   HCT 39.4 09/05/2012   MCV 91.6 09/05/2012   PLT 151 09/05/2012    Chemistry:    Chemistry      Component Value Date/Time   NA 139 07/06/2012 0949   NA 133* 03/30/2012 0630   K 4.4 07/06/2012 0949   K 3.7 03/30/2012 0630   CL 105 07/06/2012 0949   CL 99 03/30/2012 0630   CO2 21* 07/06/2012 0949   CO2 26 03/30/2012 0630   BUN 11.0 07/06/2012 0949   BUN 9 03/30/2012 0630   CREATININE 1.0 07/06/2012 0949   CREATININE 0.71 03/30/2012 0630      Component Value Date/Time   CALCIUM 9.3 07/06/2012 0949   CALCIUM 8.5 03/30/2012 0630   ALKPHOS 74 07/06/2012 0949   ALKPHOS 80 03/27/2012 1810   AST 26 07/06/2012 0949   AST 24 03/27/2012 1810   ALT 24 07/06/2012 0949   ALT 27 03/27/2012 1810   BILITOT 0.51 07/06/2012 0949   BILITOT 0.6 03/27/2012 1810       Studies/Results: No results found.  Medications: I have reviewed the patient's current medications.  Assessment/Plan:  1. Non-Hodgkin's lymphoma, large B-cell lymphoma, CD20 positive involving 8  needle core biopsy of a right iliac lymph node 02/12/2012. CT of the abdomen and pelvis on 01/15/2012 showed abdominal/pelvic lymphadenopathy. Serum LDH in normal range on 02/25/2012. Staging PET scan 02/25/2012 with a small lymph node posteriolateral to the oropharynx on the left measuring 9 x 7 mm with SUV 5.4; large cluster of hypermetabolic lymph nodes in the right hilum with SUV 4.3; cluster of mildly enlarged hepatoduodenal ligament lymph nodes and mildly enlarged portacaval lymph node with hypermetabolic activity (SUV max 37.1); numerous borderline enlarged and mildly enlarged periaortic retroperitoneal lymph nodes with extensive hypermetabolic activity (SUV max 43.6-47.6); numerous borderline enlarged and mildly enlarged hypermetabolic lymph nodes along the pelvic sidewall bilaterally most pronounced on the right with a right external iliac lymph node measuring 2.9 x 4.1 cm (SUV max 52.5). Bone marrow aspiration/biopsy 03/09/2012 negative for involvement with lymphoma. Status post cycle 1 of CHOP-Rituxan on 03/15/2012. She completed cycle 3 on 05/04/2012 and cycle 4 on 05/25/2012. Restaging PET scan on 06/10/2012 was markedly improved with resolution of the thoracic and retroperitoneal adenopathy and near complete resolution of porta hepatis adenopathy. She completed cycle 6 of CHOP/Rituxan on 07/06/2012. 2. History of night sweats likely secondary to #1. 3. Rectal bleeding June 2013 status post negative colonoscopy 01/28/2012 and upper endoscopy 02/11/2012. The bleeding was likely related to hemorrhoids. 4. COPD. 5. Remote history of larynx cancer. 6. Status post Port-A-Cath placement  03/09/2012. 7. 2-D echo 03/11/2012 with left ventricular ejection fraction 60-65%. 8. Admission with febrile neutropenia 03/22/2012 with no source for infection identified 9. Admission with fever and a chest x-ray consistent with "pneumonia"on 03/27/2012. She completed a course of Levaquin. 10. Pancytopenia following  cycle 1 of CHOP-rituximab-resolved. 11. History of atrial fibrillation-maintained on Coumadin.  12. Numbness/tingling in the fingertips and toes. The numbness in the toes predated the start of chemotherapy and is unchanged. She also reports the tingling in the fingertips was present prior to the start of chemotherapy. The vincristine was dose reduced with cycle 2. Vincristine was discontinued beginning with cycle 3. Symptoms stable.  Disposition-Ms. Thoennes appears stable. She is in clinical remission from the non-Hodgkin's lymphoma. We are referring her to interventional radiology for Port-A-Cath removal. She will return for a followup visit in 3 months. She will contact the office in the interim with any problems.  Plan reviewed with Dr. Truett Perna.  Lonna Cobb ANP/GNP-BC

## 2012-09-05 NOTE — Telephone Encounter (Signed)
gv and printed appt schedule for pt for April...lvm for [name] in IR for port removal with pt information..the patient aware

## 2012-09-07 ENCOUNTER — Ambulatory Visit: Payer: Self-pay | Admitting: Family

## 2012-09-07 ENCOUNTER — Telehealth: Payer: Self-pay | Admitting: *Deleted

## 2012-09-07 DIAGNOSIS — I4891 Unspecified atrial fibrillation: Secondary | ICD-10-CM

## 2012-09-07 LAB — POCT INR: INR: 2.6

## 2012-09-07 MED ORDER — ENOXAPARIN SODIUM 120 MG/0.8ML ~~LOC~~ SOLN: 120.0000 mg | Freq: Every day | SUBCUTANEOUS | Status: DC

## 2012-09-07 NOTE — Telephone Encounter (Signed)
Inetta Fermo from [facility] called and said pt is having a port removed on 09/12/12 and she needs to stop coumadin for 5 days starting 09/07/12.  Padonda was at lunch so I told her it would be ok and if there were any changes we would notify patient

## 2012-09-07 NOTE — Telephone Encounter (Signed)
Pt aware of Padonda's note but declines the usage of lovenox, stating that she has never had to use it before unless she was in the hospital. Pt advised that not doing the lovenox bridge does increase her chances of clotting and she verbalized understanding.   Oran Rein is aware

## 2012-09-07 NOTE — Telephone Encounter (Signed)
Stop coumadin 5 days prior to the procedure. Start Lovenox daily Sq. Stop Lovenox 24 hours prior to procedure.  Post procedure: Resume Coumadin and Lovenox together at normal dosage. Recheck INR within 5 days.

## 2012-09-08 ENCOUNTER — Encounter (HOSPITAL_COMMUNITY): Payer: Self-pay | Admitting: Pharmacy Technician

## 2012-09-08 ENCOUNTER — Other Ambulatory Visit: Payer: Self-pay | Admitting: Radiology

## 2012-09-09 NOTE — Patient Instructions (Signed)
error 

## 2012-09-12 ENCOUNTER — Encounter (HOSPITAL_COMMUNITY): Payer: Self-pay

## 2012-09-12 ENCOUNTER — Other Ambulatory Visit: Payer: Self-pay | Admitting: Radiology

## 2012-09-12 ENCOUNTER — Ambulatory Visit (HOSPITAL_COMMUNITY)
Admission: RE | Admit: 2012-09-12 | Discharge: 2012-09-12 | Disposition: A | Discharge status: Home / Self Care | Payer: Medicare Other | Source: Ambulatory Visit | Attending: Nurse Practitioner | Admitting: Nurse Practitioner

## 2012-09-12 ENCOUNTER — Ambulatory Visit (HOSPITAL_COMMUNITY)
Admission: RE | Admit: 2012-09-12 | Discharge: 2012-09-12 | Disposition: A | Discharge status: Home / Self Care | Payer: Medicare Other | Source: Ambulatory Visit | Attending: Oncology | Admitting: Oncology

## 2012-09-12 ENCOUNTER — Other Ambulatory Visit (HOSPITAL_COMMUNITY): Payer: Medicare Other

## 2012-09-12 DIAGNOSIS — C8589 Other specified types of non-Hodgkin lymphoma, extranodal and solid organ sites: Secondary | ICD-10-CM | POA: Insufficient documentation

## 2012-09-12 DIAGNOSIS — Z7902 Long term (current) use of antithrombotics/antiplatelets: Secondary | ICD-10-CM | POA: Insufficient documentation

## 2012-09-12 DIAGNOSIS — C8510 Unspecified B-cell lymphoma, unspecified site: Secondary | ICD-10-CM

## 2012-09-12 MED ORDER — CEFAZOLIN SODIUM-DEXTROSE 2-3 GM-% IV SOLR: 2.0000 g | INTRAVENOUS | Status: DC

## 2012-09-12 MED ORDER — SODIUM CHLORIDE 0.9 % IV SOLN: INTRAVENOUS | Status: DC

## 2012-09-12 NOTE — Progress Notes (Signed)
De henn here. Procedure will be cancelled due to taking lovenox this am.  Pt is instructed to hold lovenox in am, and procedure will be rescheduled for tomorrow.  Per dr Lowella Dandy, send todays labs and if normal, will not need to redraw. Per kevin allred, npo after 0600 tomorrow   Arrive at 1330 tomorrow for procedure

## 2012-09-13 ENCOUNTER — Encounter (HOSPITAL_COMMUNITY)
Admission: RE | Admit: 2012-09-13 | Discharge: 2012-09-13 | Disposition: A | Discharge status: Home / Self Care | Payer: Medicare Other | Source: Ambulatory Visit | Attending: Oncology | Admitting: Oncology

## 2012-09-13 ENCOUNTER — Encounter (HOSPITAL_COMMUNITY): Payer: Self-pay

## 2012-09-13 ENCOUNTER — Ambulatory Visit (HOSPITAL_COMMUNITY)
Admission: RE | Admit: 2012-09-13 | Discharge: 2012-09-13 | Disposition: A | Discharge status: Home / Self Care | Payer: Medicare Other | Source: Ambulatory Visit | Attending: Nurse Practitioner | Admitting: Nurse Practitioner

## 2012-09-13 VITALS — BP 121/77 | HR 90 | Temp 97.3°F | Resp 20

## 2012-09-13 DIAGNOSIS — M1710 Unilateral primary osteoarthritis, unspecified knee: Secondary | ICD-10-CM

## 2012-09-13 DIAGNOSIS — R509 Fever, unspecified: Secondary | ICD-10-CM

## 2012-09-13 DIAGNOSIS — I739 Peripheral vascular disease, unspecified: Secondary | ICD-10-CM

## 2012-09-13 DIAGNOSIS — I10 Essential (primary) hypertension: Secondary | ICD-10-CM

## 2012-09-13 DIAGNOSIS — K632 Fistula of intestine: Secondary | ICD-10-CM

## 2012-09-13 DIAGNOSIS — M542 Cervicalgia: Secondary | ICD-10-CM

## 2012-09-13 DIAGNOSIS — F411 Generalized anxiety disorder: Secondary | ICD-10-CM

## 2012-09-13 DIAGNOSIS — M7501 Adhesive capsulitis of right shoulder: Secondary | ICD-10-CM

## 2012-09-13 DIAGNOSIS — Z79899 Other long term (current) drug therapy: Secondary | ICD-10-CM | POA: Insufficient documentation

## 2012-09-13 DIAGNOSIS — J449 Chronic obstructive pulmonary disease, unspecified: Secondary | ICD-10-CM | POA: Insufficient documentation

## 2012-09-13 DIAGNOSIS — D649 Anemia, unspecified: Secondary | ICD-10-CM

## 2012-09-13 DIAGNOSIS — R5381 Other malaise: Secondary | ICD-10-CM

## 2012-09-13 DIAGNOSIS — Z452 Encounter for adjustment and management of vascular access device: Secondary | ICD-10-CM | POA: Insufficient documentation

## 2012-09-13 DIAGNOSIS — F3289 Other specified depressive episodes: Secondary | ICD-10-CM

## 2012-09-13 DIAGNOSIS — K625 Hemorrhage of anus and rectum: Secondary | ICD-10-CM

## 2012-09-13 DIAGNOSIS — R079 Chest pain, unspecified: Secondary | ICD-10-CM

## 2012-09-13 DIAGNOSIS — E860 Dehydration: Secondary | ICD-10-CM

## 2012-09-13 DIAGNOSIS — E785 Hyperlipidemia, unspecified: Secondary | ICD-10-CM

## 2012-09-13 DIAGNOSIS — J4489 Other specified chronic obstructive pulmonary disease: Secondary | ICD-10-CM

## 2012-09-13 DIAGNOSIS — K279 Peptic ulcer, site unspecified, unspecified as acute or chronic, without hemorrhage or perforation: Secondary | ICD-10-CM

## 2012-09-13 DIAGNOSIS — F329 Major depressive disorder, single episode, unspecified: Secondary | ICD-10-CM

## 2012-09-13 DIAGNOSIS — K219 Gastro-esophageal reflux disease without esophagitis: Secondary | ICD-10-CM

## 2012-09-13 DIAGNOSIS — Z8673 Personal history of transient ischemic attack (TIA), and cerebral infarction without residual deficits: Secondary | ICD-10-CM | POA: Insufficient documentation

## 2012-09-13 DIAGNOSIS — Z96659 Presence of unspecified artificial knee joint: Secondary | ICD-10-CM

## 2012-09-13 DIAGNOSIS — C8510 Unspecified B-cell lymphoma, unspecified site: Secondary | ICD-10-CM

## 2012-09-13 DIAGNOSIS — I4891 Unspecified atrial fibrillation: Secondary | ICD-10-CM

## 2012-09-13 DIAGNOSIS — I8310 Varicose veins of unspecified lower extremity with inflammation: Secondary | ICD-10-CM

## 2012-09-13 DIAGNOSIS — K432 Incisional hernia without obstruction or gangrene: Secondary | ICD-10-CM

## 2012-09-13 DIAGNOSIS — R1319 Other dysphagia: Secondary | ICD-10-CM

## 2012-09-13 DIAGNOSIS — L659 Nonscarring hair loss, unspecified: Secondary | ICD-10-CM

## 2012-09-13 DIAGNOSIS — R5383 Other fatigue: Secondary | ICD-10-CM

## 2012-09-13 DIAGNOSIS — R609 Edema, unspecified: Secondary | ICD-10-CM

## 2012-09-13 DIAGNOSIS — C8589 Other specified types of non-Hodgkin lymphoma, extranodal and solid organ sites: Secondary | ICD-10-CM | POA: Insufficient documentation

## 2012-09-13 DIAGNOSIS — R0602 Shortness of breath: Secondary | ICD-10-CM

## 2012-09-13 DIAGNOSIS — Z800 Family history of malignant neoplasm of digestive organs: Secondary | ICD-10-CM

## 2012-09-13 DIAGNOSIS — R197 Diarrhea, unspecified: Secondary | ICD-10-CM

## 2012-09-13 DIAGNOSIS — K5733 Diverticulitis of large intestine without perforation or abscess with bleeding: Secondary | ICD-10-CM

## 2012-09-13 DIAGNOSIS — M1990 Unspecified osteoarthritis, unspecified site: Secondary | ICD-10-CM

## 2012-09-13 DIAGNOSIS — M19049 Primary osteoarthritis, unspecified hand: Secondary | ICD-10-CM

## 2012-09-13 MED ORDER — SODIUM CHLORIDE 0.9 % IV SOLN
INTRAVENOUS | Status: DC
  Administered 2012-09-13: 13:00:00 via INTRAVENOUS

## 2012-09-13 MED ORDER — CEFAZOLIN SODIUM-DEXTROSE 2-3 GM-% IV SOLR
2.0000 g | Freq: Once | INTRAVENOUS | Status: AC
  Administered 2012-09-13: 2 g via INTRAVENOUS
  Filled 2012-09-13: qty 50

## 2012-09-13 MED ORDER — MIDAZOLAM HCL 2 MG/2ML IJ SOLN
INTRAMUSCULAR | Status: AC
  Filled 2012-09-13: qty 2

## 2012-09-13 MED ORDER — FENTANYL CITRATE 0.05 MG/ML IJ SOLN
INTRAMUSCULAR | Status: AC | PRN
  Administered 2012-09-13: 50 ug via INTRAVENOUS

## 2012-09-13 MED ORDER — FENTANYL CITRATE 0.05 MG/ML IJ SOLN
INTRAMUSCULAR | Status: AC
  Filled 2012-09-13: qty 2

## 2012-09-13 MED ORDER — MIDAZOLAM HCL 2 MG/2ML IJ SOLN
INTRAMUSCULAR | Status: AC | PRN
  Administered 2012-09-13: 1 mg via INTRAVENOUS

## 2012-09-13 NOTE — H&P (Signed)
Chief Complaint: "I'm here for my port removal" Referring Physician:Lisa Maisie Fus, NP HPI: [name] is an 79 y.o. female with hx of lymphoma. She has completed her chemotherapy and is reported to be in remission. She is here today for port removal, which was originally placed in 8/13. PMHx and meds reviewed. Pt has been off Coumadin X 1 week, with Lovenox bridge, which she has not had since yesterday.  Past Medical History:  Past Medical History  Diagnosis Date  . GERD (gastroesophageal reflux disease)   . Arthritis   . Stroke 10/2002    no residual  . History of TIAs   . Diverticulosis   . PUD (peptic ulcer disease)   . PVD (peripheral vascular disease)   . Hyperlipidemia   . Anxiety   . Depression   . 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 Blood transfusion   . COPD (chronic obstructive pulmonary disease)   . Lymphona, mantle cell, inguinal region/lower limb     Past Surgical History:  Past Surgical History  Procedure Laterality Date  . Appendectomy    . Colon surgery  09/13/2008    iliostomy for diverticulitis  . Hernia repair    . Tubal ligation    . Throat surgery  1986  . Total knee arthroplasty      2002 left 2008 right  . Reversal of iliostomy  01/2010  . Cataract extraction  2005    bilateral  . Carotid surgery      2004 a month apart right and left    Family History:  Family History  Problem Relation Age of Onset  . Colon cancer Sister     colon  . Hyperlipidemia Mother   . Stroke Mother   . Cancer Father     mesothelioma    Social History:  reports that she has quit smoking. She has never used smokeless tobacco. She reports that she does not drink alcohol or use illicit drugs.  Allergies:  Allergies  Allergen Reactions  . Propoxyphene Hcl   . Propoxyphene-Acetaminophen     REACTION: Reaction not known    Medications: Alum & Mag Hydroxide-Simeth (MAGIC MOUTHWASH) SOLN (Taking) 240 mL 0  03/31/2012 Sig - Route: Take 10 mLs by mouth every 4 (four) hours as needed. Swish and swallow. Not within two hours of cipro, avelox or tetracycline - Oral Number of times this order has been changed since signing: 1 Order [address] bisoprolol (ZEBETA) 5 MG tablet (Taking) Sig - Route: Take 5 mg by mouth daily at 12 noon. - Oral Class: Historical Med Number of times this order has been changed since signing: 4 Order [address] clotrimazole-betamethasone (LOTRISONE) lotion (Taking) 04/04/2012 Sig - Route: Apply 1 application topically daily as needed. Apply ti ankles - Topical Class: Historical Med Number of times this order has been changed since signing: 3 Order [address] enoxaparin (LOVENOX) 120 MG/0.8ML injection (Taking) 14 Syringe 0 09/07/2012 Sig - Route: Inject 0.8 mLs (120 mg total) into the skin daily. - Subcutaneous Number of times this order has been changed since signing: 1 Order [address] tiotropium (SPIRIVA) 18 MCG inhalation capsule (Taking) Sig - Route: Place 18 mcg into inhaler and inhale daily. - Inhalation Class: Historical Med Number of times this order has been changed since signing: 1 Order [address] warfarin (COUMADIN) 2.5 MG tablet 04/05/2012 04/05/2013 Sig - Route: Take 5 mg by mouth daily. Take 2 tabs by mouth daily -  Oral Class: Historical Med   Please HPI for pertinent positives, otherwise complete 10 system ROS negative.  Physical Exam: Blood pressure 137/83, pulse 82, temperature 97.4 F (36.3 C), temperature source Oral, resp. rate 18, SpO2 95.00%. There is no weight on file to calculate BMI.   General Appearance:  Alert, cooperative, no distress, appears stated age  Head:  Normocephalic, without obvious abnormality, atraumatic  ENT: Unremarkable  Lungs:   Clear to auscultation bilaterally, no w/r/r, respirations unlabored without use of accessory muscles.  Chest Wall:  No tenderness or deformity, right port intact, site NT.  Heart:  Regular rate and rhythm, S1, S2  normal, no murmur, rub or gallop. Carotids 2+ without bruit.  Neurologic: Normal affect, no gross deficits.   Results for orders placed during the hospital encounter of 09/12/12 (from the past 48 hour(s))  APTT     Status: None   Collection Time    09/12/12  8:45 AM      Result Value Range   aPTT 35  24 - 37 seconds  PROTIME-INR     Status: None   Collection Time    09/12/12  8:45 AM      Result Value Range   Prothrombin Time 12.5  11.6 - 15.2 seconds   INR 0.94  0.00 - 1.49   CBC    Component Value Date/Time   WBC 6.7 09/05/2012 0938   RBC 4.30 09/05/2012 0938   HGB 12.4 09/05/2012 0938   HCT 39.4 09/05/2012 0938   PLT 151 09/05/2012 0938      Assessment/Plan Lymphoma, in remission Discussed port removal procedure. Explained risks, complications, recovery and wound care. Labs reviewed, ok Consent signed in chart   Brayton El PA-C 09/13/2012, 1:48 PM

## 2012-09-13 NOTE — Procedures (Signed)
Successful RT IJ PORT REMOVAL NO COMP STABLE  

## 2012-09-15 ENCOUNTER — Encounter: Payer: Medicare Other | Admitting: Family

## 2012-09-16 ENCOUNTER — Encounter: Payer: Medicare Other | Admitting: Family

## 2012-09-17 ENCOUNTER — Encounter: Payer: Self-pay | Admitting: Family

## 2012-09-17 ENCOUNTER — Other Ambulatory Visit: Payer: Self-pay

## 2012-09-19 ENCOUNTER — Ambulatory Visit (INDEPENDENT_AMBULATORY_CARE_PROVIDER_SITE_OTHER): Payer: Medicare Other | Admitting: Family

## 2012-09-19 DIAGNOSIS — I4891 Unspecified atrial fibrillation: Secondary | ICD-10-CM

## 2012-09-19 LAB — POCT INR: INR: 1.1

## 2012-09-19 NOTE — Patient Instructions (Addendum)
Take 10 mg today and tomorrow. Then Continue 5mg  everyday except 7.5mg  on Thursday only. Recheck in 1 weeks.   Anticoagulation Dose Instructions as of 09/19/2012     Glynis Smiles Tue Wed Thu Fri Sat   New Dose 5 mg 5 mg 5 mg 5 mg 7.5 mg 5 mg 5 mg    Description       Take 10 mg today and tomorrow. Then Continue 5mg  everyday except 7.5mg  on Thursday only. Recheck in 4 weeks.

## 2012-09-22 ENCOUNTER — Encounter: Payer: Medicare Other | Admitting: Family

## 2012-09-26 ENCOUNTER — Encounter: Payer: Self-pay | Admitting: Internal Medicine

## 2012-09-26 ENCOUNTER — Ambulatory Visit (INDEPENDENT_AMBULATORY_CARE_PROVIDER_SITE_OTHER): Payer: Medicare Other | Admitting: Family

## 2012-09-26 ENCOUNTER — Ambulatory Visit (INDEPENDENT_AMBULATORY_CARE_PROVIDER_SITE_OTHER): Payer: Medicare Other | Admitting: Internal Medicine

## 2012-09-26 VITALS — BP 134/76 | HR 88 | Temp 98.0°F | Resp 16 | Ht 64.5 in | Wt 180.0 lb

## 2012-09-26 DIAGNOSIS — I4891 Unspecified atrial fibrillation: Secondary | ICD-10-CM

## 2012-09-26 DIAGNOSIS — J449 Chronic obstructive pulmonary disease, unspecified: Secondary | ICD-10-CM

## 2012-09-26 DIAGNOSIS — I10 Essential (primary) hypertension: Secondary | ICD-10-CM

## 2012-09-26 DIAGNOSIS — G609 Hereditary and idiopathic neuropathy, unspecified: Secondary | ICD-10-CM

## 2012-09-26 LAB — CBC WITH DIFFERENTIAL/PLATELET
Eosinophils Relative: 3.2 % (ref 0.0–5.0)
HCT: 36.1 % (ref 36.0–46.0)
Hemoglobin: 11.9 g/dL — ABNORMAL LOW (ref 12.0–15.0)
Lymphocytes Relative: 20.2 % (ref 12.0–46.0)
Lymphs Abs: 0.9 10*3/uL (ref 0.7–4.0)
Monocytes Relative: 15.4 % — ABNORMAL HIGH (ref 3.0–12.0)
Neutro Abs: 2.8 10*3/uL (ref 1.4–7.7)
RBC: 4.21 Mil/uL (ref 3.87–5.11)
WBC: 4.6 10*3/uL (ref 4.5–10.5)

## 2012-09-26 LAB — POCT INR: INR: 1.3

## 2012-09-26 LAB — BASIC METABOLIC PANEL
BUN: 15 mg/dL (ref 6–23)
CO2: 28 mEq/L (ref 19–32)
Chloride: 104 mEq/L (ref 96–112)
Creatinine, Ser: 1 mg/dL (ref 0.4–1.2)
Potassium: 5 mEq/L (ref 3.5–5.1)

## 2012-09-26 MED ORDER — DABIGATRAN ETEXILATE MESYLATE 150 MG PO CAPS: 150.0000 mg | ORAL_CAPSULE | Freq: Two times a day (BID) | ORAL | Status: DC

## 2012-09-26 NOTE — Patient Instructions (Addendum)
syncopal event from [facility]  Be careful to stay hydrated Iced tea is not hydrating fluid!  A B12 shot was given today for neuropathy

## 2012-09-26 NOTE — Patient Instructions (Signed)
Increase Coumadin to 7.5mg  daily. Recheck in 10 days.   Anticoagulation Dose Instructions as of 09/26/2012     [name] Tue Wed Thu Fri Sat   New Dose 7.5 mg 7.5 mg 7.5 mg 7.5 mg 7.5 mg 7.5 mg 7.5 mg    Description       Increase Coumadin to 7.5mg  daily. Recheck in 10 days.

## 2012-09-26 NOTE — Progress Notes (Signed)
  Subjective:    Patient ID: [name], female    DOB: 1927-08-06, 79 y.o.   MRN: [id number]  HPI  Discussion of the use of a novel anticoagulant She has a history of AF on coumadin but also has cancer and the diet for coumadin has bee difficualt and the pro-times are costly  Has chemo induced ? Worsened? Peripheral neuropathy B12 shot after the levels chanced Review of Systems     Objective:   Physical Exam        Assessment & Plan:

## 2012-10-06 ENCOUNTER — Encounter: Payer: Medicare Other | Admitting: Family

## 2012-10-17 ENCOUNTER — Ambulatory Visit: Payer: Medicare Other | Admitting: Internal Medicine

## 2012-10-25 ENCOUNTER — Telehealth: Payer: Self-pay | Admitting: Family

## 2012-10-25 NOTE — Telephone Encounter (Signed)
Needs INR visit asap!! 

## 2012-10-26 ENCOUNTER — Telehealth: Payer: Self-pay | Admitting: Family

## 2012-10-26 ENCOUNTER — Ambulatory Visit: Payer: Self-pay | Admitting: Family

## 2012-10-26 DIAGNOSIS — I4891 Unspecified atrial fibrillation: Secondary | ICD-10-CM

## 2012-10-26 NOTE — Patient Instructions (Signed)
D/C by Dr. Lovell Sheehan

## 2012-10-26 NOTE — Telephone Encounter (Signed)
Pt no longer on coumadin. Husband states d/c by Dr. Lovell Sheehan

## 2012-11-04 ENCOUNTER — Ambulatory Visit (INDEPENDENT_AMBULATORY_CARE_PROVIDER_SITE_OTHER): Payer: Medicare Other | Admitting: Internal Medicine

## 2012-11-04 DIAGNOSIS — D519 Vitamin B12 deficiency anemia, unspecified: Secondary | ICD-10-CM

## 2012-11-04 DIAGNOSIS — D518 Other vitamin B12 deficiency anemias: Secondary | ICD-10-CM

## 2012-11-04 MED ORDER — CYANOCOBALAMIN 1000 MCG/ML IJ SOLN
1000.0000 ug | INTRAMUSCULAR | Status: AC
  Administered 2012-11-04: 1000 ug via INTRAMUSCULAR

## 2012-11-17 ENCOUNTER — Ambulatory Visit: Payer: Self-pay | Admitting: Family

## 2012-11-28 ENCOUNTER — Ambulatory Visit (HOSPITAL_BASED_OUTPATIENT_CLINIC_OR_DEPARTMENT_OTHER): Payer: Medicare Other | Admitting: Oncology

## 2012-11-28 ENCOUNTER — Telehealth: Payer: Self-pay | Admitting: Oncology

## 2012-11-28 VITALS — BP 110/44 | HR 88 | Temp 97.8°F | Resp 20 | Ht 64.5 in | Wt 181.0 lb

## 2012-11-28 DIAGNOSIS — C8510 Unspecified B-cell lymphoma, unspecified site: Secondary | ICD-10-CM

## 2012-11-28 DIAGNOSIS — C8589 Other specified types of non-Hodgkin lymphoma, extranodal and solid organ sites: Secondary | ICD-10-CM

## 2012-11-28 DIAGNOSIS — I4891 Unspecified atrial fibrillation: Secondary | ICD-10-CM

## 2012-11-28 NOTE — Progress Notes (Signed)
   [facility]    OFFICE PROGRESS NOTE   INTERVAL HISTORY:   She returns as scheduled. She reports arthritis pain in the hands. Stable chronic foot discomfort. No new complaint.  Objective:  Vital signs in last 24 hours:  Blood pressure 110/44, pulse 88, temperature 97.8 F (36.6 C), temperature source Oral, resp. rate 20, height 5' 4.5" (1.638 m), weight 181 lb (82.101 kg).    HEENT: Neck without mass Lymphatics: No cervical, supraclavicular, axillary, or inguinal nodes Resp: Scattered end inspiratory rhonchi, no respiratory distress Cardio: Regular rate and rhythm GI: No hepatosplenomegaly Vascular: No leg edema   Lab Results:  Lab Results  Component Value Date   WBC 4.6 09/26/2012   HGB 11.9* 09/26/2012   HCT 36.1 09/26/2012   MCV 85.6 09/26/2012   PLT 232.0 09/26/2012      Medications: I have reviewed the patient's current medications.  Assessment/Plan: 1. Non-Hodgkin's lymphoma, large B-cell lymphoma, CD20 positive involving 8 needle core biopsy of a right iliac lymph node 02/12/2012. CT of the abdomen and pelvis on 01/15/2012 showed abdominal/pelvic lymphadenopathy. Serum LDH in normal range on 02/25/2012. Staging PET scan 02/25/2012 with a small lymph node posteriolateral to the oropharynx on the left measuring 9 x 7 mm with SUV 5.4; large cluster of hypermetabolic lymph nodes in the right hilum with SUV 4.3; cluster of mildly enlarged hepatoduodenal ligament lymph nodes and mildly enlarged portacaval lymph node with hypermetabolic activity (SUV max 37.1); numerous borderline enlarged and mildly enlarged periaortic retroperitoneal lymph nodes with extensive hypermetabolic activity (SUV max 43.6-47.6); numerous borderline enlarged and mildly enlarged hypermetabolic lymph nodes along the pelvic sidewall bilaterally most pronounced on the right with a right external iliac lymph node measuring 2.9 x 4.1 cm (SUV max 52.5). Bone marrow aspiration/biopsy  03/09/2012 negative for involvement with lymphoma. Status post cycle 1 of CHOP-Rituxan on 03/15/2012. She completed cycle 3 on 05/04/2012 and cycle 4 on 05/25/2012. Restaging PET scan on 06/10/2012 was markedly improved with resolution of the thoracic and retroperitoneal adenopathy and near complete resolution of porta hepatis adenopathy. She completed cycle 6 of CHOP/Rituxan on 07/06/2012. 2. History of night sweats likely secondary to #1. 3. Rectal bleeding June 2013 status post negative colonoscopy 01/28/2012 and upper endoscopy 02/11/2012. The bleeding was likely related to hemorrhoids. 4. COPD. 5. Remote history of larynx cancer. 6. Status post Port-A-Cath placement 03/09/2012. Removed 09/13/2012. 7. 2-D echo 03/11/2012 with left ventricular ejection fraction 60-65%. 8. Admission with febrile neutropenia 03/22/2012 with no source for infection identified 9. Admission with fever and a chest x-ray consistent with "pneumonia"on 03/27/2012. She completed a course of Levaquin. 10. Pancytopenia following cycle 1 of CHOP-rituximab-resolved. 11. History of atrial fibrillation-maintained on Coumadin.  12. Numbness/tingling in the fingertips and toes. The numbness in the toes predated the start of chemotherapy and is unchanged. She also reports the tingling in the fingertips was present prior to the start of chemotherapy. The vincristine was dose reduced with cycle 2. Vincristine was discontinued beginning with cycle 3. Symptoms stable.  Disposition:  She remains in clinical remission from non-Hodgkin's lymphoma. Ms. Schoenfelder will return for an office visit in 3 months.   Thornton Papas, MD  11/28/2012  9:51 AM

## 2012-11-28 NOTE — Telephone Encounter (Signed)
gv and printed appt sched and avs fo rpt for July °

## 2012-12-01 HISTORY — PX: ABDOMINAL EXPLORATION SURGERY: SHX538

## 2012-12-13 ENCOUNTER — Other Ambulatory Visit: Payer: Self-pay | Admitting: Internal Medicine

## 2013-01-09 ENCOUNTER — Ambulatory Visit (INDEPENDENT_AMBULATORY_CARE_PROVIDER_SITE_OTHER): Payer: Medicare Other | Admitting: Internal Medicine

## 2013-01-09 ENCOUNTER — Encounter: Payer: Self-pay | Admitting: Internal Medicine

## 2013-01-09 VITALS — BP 126/74 | HR 72 | Temp 98.3°F | Resp 16 | Ht 64.5 in | Wt 182.0 lb

## 2013-01-09 DIAGNOSIS — M79641 Pain in right hand: Secondary | ICD-10-CM | POA: Insufficient documentation

## 2013-01-09 DIAGNOSIS — I10 Essential (primary) hypertension: Secondary | ICD-10-CM

## 2013-01-09 DIAGNOSIS — J449 Chronic obstructive pulmonary disease, unspecified: Secondary | ICD-10-CM

## 2013-01-09 DIAGNOSIS — D519 Vitamin B12 deficiency anemia, unspecified: Secondary | ICD-10-CM

## 2013-01-09 DIAGNOSIS — M79609 Pain in unspecified limb: Secondary | ICD-10-CM

## 2013-01-09 DIAGNOSIS — D518 Other vitamin B12 deficiency anemias: Secondary | ICD-10-CM

## 2013-01-09 MED ORDER — DICLOFENAC SODIUM 1 % TD GEL: 2.0000 g | Freq: Four times a day (QID) | TRANSDERMAL | Status: DC

## 2013-01-09 MED ORDER — CYANOCOBALAMIN 1000 MCG/ML IJ SOLN
1000.0000 ug | INTRAMUSCULAR | Status: AC
  Administered 2013-01-09: 1000 ug via INTRAMUSCULAR

## 2013-01-09 NOTE — Addendum Note (Signed)
Addended by: Willy Eddy on: 01/09/2013 11:48 AM   Modules accepted: Orders

## 2013-01-09 NOTE — Progress Notes (Signed)
Subjective:    Patient ID: [name], female    DOB: [birth year], 79 y.o.   MRN: [id number]  HPI  Stable HTN Has severe OA in knees and hands Hx if shots ( six years ago) Has not been exercising Needs to resume walking     Review of Systems  Constitutional: Negative for activity change, appetite change and fatigue.  HENT: Negative for ear pain, congestion, neck pain, postnasal drip and sinus pressure.   Eyes: Negative for redness and visual disturbance.  Respiratory: Positive for shortness of breath. Negative for cough and wheezing.   Gastrointestinal: Negative for abdominal pain and abdominal distention.  Genitourinary: Negative for dysuria, frequency and menstrual problem.  Musculoskeletal: Negative for myalgias, joint swelling and arthralgias.  Skin: Negative for rash and wound.  Neurological: Negative for dizziness, weakness and headaches.  Hematological: Negative for adenopathy. Does not bruise/bleed easily.  Psychiatric/Behavioral: Negative for sleep disturbance and decreased concentration.   Past Medical History  Diagnosis Date  . GERD (gastroesophageal reflux disease)   . Arthritis   . Stroke 10/2002    no residual  . History of TIAs   . Diverticulosis   . PUD (peptic ulcer disease)   . PVD (peripheral vascular disease)   . Hyperlipidemia   . Anxiety   . Depression   . 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 Blood transfusion   . COPD (chronic obstructive pulmonary disease)   . Lymphona, mantle cell, inguinal region/lower limb     History   Social History  . Marital Status: Married    Spouse Name: N/A    Number of Children: 6  . Years of Education: N/A   Occupational History  . retired    Social History Main Topics  . Smoking status: Former Games developer  . Smokeless tobacco: Never Used  . Alcohol Use: No  . Drug Use: No  . Sexually Active: Yes   Other Topics Concern  . Not on file   Social  History Narrative  . No narrative on file    Past Surgical History  Procedure Laterality Date  . Appendectomy    . Colon surgery  09/13/2008    iliostomy for diverticulitis  . Hernia repair    . Tubal ligation    . Throat surgery  1986  . Total knee arthroplasty      2002 left 2008 right  . Reversal of iliostomy  01/2010  . Cataract extraction  2005    bilateral  . Carotid surgery      2004 a month apart right and left    Family History  Problem Relation Age of Onset  . Colon cancer Sister     colon  . Hyperlipidemia Mother   . Stroke Mother   . Cancer Father     mesothelioma    Allergies  Allergen Reactions  . Propoxyphene Hcl   . Propoxyphene-Acetaminophen     REACTION: Reaction not known    Current Outpatient Prescriptions on File Prior to Visit  Medication Sig Dispense Refill  . ALPRAZolam (XANAX) 0.5 MG tablet TAKE ONE TABLET BY MOUTH THREE TIMES DAILY AS NEEDED FOR SLEEP  90 tablet  2  . bisoprolol (ZEBETA) 5 MG tablet Take 5 mg by mouth daily at 12 noon.       . dabigatran (PRADAXA) 150 MG CAPS Take 1 capsule (150 mg total) by mouth every 12 (twelve) hours.  60 capsule  3  .  Vitamin D, Ergocalciferol, (DRISDOL) 50000 UNITS CAPS Take 50,000 Units by mouth every Sunday.       No current facility-administered medications on file prior to visit.    BP 126/74  Pulse 72  Temp(Src) 98.3 F (36.8 C)  Resp 16  Ht 5' 4.5" (1.638 m)  Wt 182 lb (82.555 kg)  BMI 30.77 kg/m2       Objective:   Physical Exam  Constitutional: She is oriented to person, place, and time. She appears well-developed and well-nourished. No distress.  HENT:  Head: Normocephalic and atraumatic.  Eyes: Conjunctivae and EOM are normal. Pupils are equal, round, and reactive to light.  Neck: Normal range of motion. Neck supple. No JVD present. No tracheal deviation present. No thyromegaly present.  Cardiovascular: Normal rate and regular rhythm.   Murmur heard. Pulmonary/Chest: Effort  normal and breath sounds normal. She has no wheezes. She exhibits no tenderness.  Abdominal: Soft. Bowel sounds are normal.  Musculoskeletal: She exhibits edema and tenderness.  Lymphadenopathy:    She has no cervical adenopathy.  Neurological: She is alert and oriented to person, place, and time. She has normal reflexes. No cranial nerve deficit.  Skin: Skin is warm and dry. She is not diaphoretic.  Psychiatric: She has a normal mood and affect. Her behavior is normal.          Assessment & Plan:  Severe OA in knees and hands Status post chemo with good prognosis HTN stable

## 2013-01-16 ENCOUNTER — Encounter: Payer: Self-pay | Admitting: Family Medicine

## 2013-01-16 ENCOUNTER — Ambulatory Visit (INDEPENDENT_AMBULATORY_CARE_PROVIDER_SITE_OTHER): Payer: Medicare Other | Admitting: Family Medicine

## 2013-01-16 VITALS — BP 126/70 | HR 85 | Temp 98.3°F | Wt 184.0 lb

## 2013-01-16 DIAGNOSIS — R531 Weakness: Secondary | ICD-10-CM

## 2013-01-16 DIAGNOSIS — R5381 Other malaise: Secondary | ICD-10-CM

## 2013-01-16 LAB — TSH: TSH: 1.59 u[IU]/mL (ref 0.35–5.50)

## 2013-01-16 NOTE — Progress Notes (Signed)
  Subjective:    Patient ID: [name], female    DOB: Nov 08, 1927, 79 y.o.   MRN: [id number]  HPI Here for several weeks of weakness in the legs. Sometimes if she has been standing awhile her legs suddenly give way and she has to grab onto something to keep from falling. No dizziness or chest pain or SOB. Her B12 level in February was extrememly low at 116 and she was to have been getting shots for this. Because of the B12 shortage however, she has only been able to get 2 shots since then. Her CBC and metabolic panel were okay at that time.    Review of Systems  Respiratory: Negative.   Cardiovascular: Negative.   Neurological: Positive for weakness and numbness. Negative for dizziness, tremors, seizures, syncope, speech difficulty, light-headedness and headaches.       Objective:   Physical Exam  Constitutional: She is oriented to person, place, and time. She appears well-developed and well-nourished. No distress.  Neck: No thyromegaly present.  Cardiovascular: Normal rate, regular rhythm, normal heart sounds and intact distal pulses.   Pulmonary/Chest: Effort normal and breath sounds normal.  Lymphadenopathy:    She has no cervical adenopathy.  Neurological: She is alert and oriented to person, place, and time. She has normal reflexes. No cranial nerve deficit. She exhibits normal muscle tone. Coordination normal.  Normal gait           Assessment & Plan:  Leg weakness, probably due to B12 deficiency. We attempted to give her a shot today but again none is available. We will check for other issues with a TSH and a vitamin D level today. We will continue B12 shots when available.

## 2013-01-17 LAB — VITAMIN D 25 HYDROXY (VIT D DEFICIENCY, FRACTURES): Vit D, 25-Hydroxy: 21 ng/mL — ABNORMAL LOW (ref 30–89)

## 2013-01-17 NOTE — Progress Notes (Signed)
Quick Note:  I spoke with pt and she does have a script for Vit D 50,000 to take once a week, however she has not been taking it. She agreed to start back on this ASAP. ______

## 2013-02-08 ENCOUNTER — Telehealth: Payer: Self-pay | Admitting: Oncology

## 2013-02-08 NOTE — Telephone Encounter (Signed)
Pt called moved appt for MD from 7/25 to September , Tania RN aware

## 2013-02-23 ENCOUNTER — Ambulatory Visit: Payer: Medicare Other | Admitting: Oncology

## 2013-02-27 ENCOUNTER — Other Ambulatory Visit: Payer: Self-pay | Admitting: *Deleted

## 2013-02-27 ENCOUNTER — Telehealth: Payer: Self-pay | Admitting: Internal Medicine

## 2013-02-27 DIAGNOSIS — I4891 Unspecified atrial fibrillation: Secondary | ICD-10-CM

## 2013-02-27 MED ORDER — DABIGATRAN ETEXILATE MESYLATE 150 MG PO CAPS: 150.0000 mg | ORAL_CAPSULE | Freq: Two times a day (BID) | ORAL | Status: DC

## 2013-02-27 NOTE — Telephone Encounter (Signed)
PT called to request a 1 month supply of dabigatran (PRADAXA) 150 MG CAPS, be sent to the [facility] on [address]. Please assist.

## 2013-02-27 NOTE — Telephone Encounter (Signed)
done

## 2013-03-02 ENCOUNTER — Telehealth: Payer: Self-pay | Admitting: Internal Medicine

## 2013-03-02 NOTE — Telephone Encounter (Signed)
Patient Information:  Caller Name: [name]  Phone: [phone]  Patient: Lisa, [name]  Gender: Female  DOB: 1928-03-13  Age: 79 Years  PCP: Darryll Capers (Adults only)  Office Follow Up:  Does the office need to follow up with this patient?: Yes  Instructions For The Office: PLS READ RN NOTE  RN Note:  Pt only notices blood after BM, scant amount. Pt is on Pradaxa w/ history of Lymphoma, last rectal bleeding, Pt's cancer was detected.  Pt is very concerned.  Pt will only see Dr Lovell Sheehan or Dr Lysle Morales.  PLEASE DISCUSS W/ DR FRY IF APPT CAN BE SCHEDULED IN ON 7-31 DUE TO BLEEDING ON PRADAXA AND F/U W/ PT.  Symptoms  Reason For Call & Symptoms: Rectal Bleeding d/t Hemorrhoid  Reviewed Health History In EMR: Yes  Reviewed Medications In EMR: Yes  Reviewed Allergies In EMR: Yes  Reviewed Surgeries / Procedures: Yes  Date of Onset of Symptoms: 02/25/2013  Guideline(s) Used:  Rectal Bleeding  Disposition Per Guideline:   Go to Office Now  Reason For Disposition Reached:   Taking Coumadin (warfarin), Pradaxa (dabigatran), or known bleeding disorder (e.g., thrombocytopenia)  Advice Given:  N/A  Patient Will Follow Care Advice:  YES

## 2013-03-02 NOTE — Telephone Encounter (Signed)
FYI pt coming in for appointment 03/03/13

## 2013-03-03 ENCOUNTER — Encounter: Payer: Self-pay | Admitting: Family Medicine

## 2013-03-03 ENCOUNTER — Ambulatory Visit (INDEPENDENT_AMBULATORY_CARE_PROVIDER_SITE_OTHER): Payer: Medicare Other | Admitting: Family Medicine

## 2013-03-03 VITALS — BP 108/64 | HR 75 | Temp 98.7°F | Wt 186.0 lb

## 2013-03-03 DIAGNOSIS — K5733 Diverticulitis of large intestine without perforation or abscess with bleeding: Secondary | ICD-10-CM

## 2013-03-03 NOTE — Progress Notes (Signed)
  Subjective:    Patient ID: [name], female    DOB: 1928/01/19, 79 y.o.   MRN: [id number]  HPI Here for 5 days of intermittent light bleeding with BMs. She has diverticular disease in the sigmoid colon, and he last colonoscopy was in June 2013. She is on chronic anticoagulation due to atrial fib. Her BMs are typically a little loose but there are times when they get loose and urgent, so she takes Imodium. This in turn causes her to get constipated and she often gets some bleeding with hard stools. This has happened again, as she used some Imodium about 6 days ago. Then stool became hard and the bleeding returned. She denies any pain or fever.    Review of Systems  Constitutional: Negative.   Respiratory: Negative.   Cardiovascular: Negative.   Gastrointestinal: Positive for diarrhea, constipation, blood in stool and anal bleeding. Negative for nausea, vomiting, abdominal pain, abdominal distention and rectal pain.       Objective:   Physical Exam  Constitutional: She appears well-developed and well-nourished. No distress.  Cardiovascular: Normal rate, regular rhythm, normal heart sounds and intact distal pulses.   Pulmonary/Chest: Effort normal and breath sounds normal.  Abdominal: Soft. Bowel sounds are normal. She exhibits no distension and no mass. There is no tenderness. There is no rebound and no guarding.          Assessment & Plan:  This is probably diverticular bleeding from constipation. She drinks plenty of water and eats yogurt daily. I suggested she use Miralax daily to help modulate the BMs. Try 1/2 of an adult dose (approx 8 grams) daily or even every other day. Recheck prn

## 2013-03-08 ENCOUNTER — Telehealth: Payer: Self-pay | Admitting: Internal Medicine

## 2013-03-08 ENCOUNTER — Other Ambulatory Visit: Payer: Self-pay

## 2013-03-08 NOTE — Telephone Encounter (Signed)
Talked with pharmacy and they stated they had refilled tooearly -cost was 130, but pt states they had been paying 25 and now went to 130. Requesting cheaper drug- in  Medicare gap-pt aware, will talk with dr Lovell Sheehan when he returns

## 2013-03-08 NOTE — Telephone Encounter (Signed)
Husband calling about PRADAXA. Wife ran into insurance gap for 2014. He is trying to get a refill, but it is $300. JANTOVEN, a generic, is what insurance recommended. Please advise and call.

## 2013-03-10 ENCOUNTER — Encounter: Payer: Self-pay | Admitting: Internal Medicine

## 2013-03-10 NOTE — Telephone Encounter (Signed)
Cannot go back on coumadin Check on line to see if there is help with pradaxa while in "donnut hole" The 300 you paid closed the  Donut hole by a 1/3  ( I think [facility] pick back up after 1000 out of pocket)

## 2013-03-10 NOTE — Telephone Encounter (Signed)
Pt informed what dr Lovell Sheehan said

## 2013-03-13 ENCOUNTER — Encounter: Payer: Self-pay | Admitting: Internal Medicine

## 2013-03-16 ENCOUNTER — Other Ambulatory Visit: Payer: Self-pay | Admitting: *Deleted

## 2013-03-21 ENCOUNTER — Telehealth: Payer: Self-pay | Admitting: Internal Medicine

## 2013-03-21 ENCOUNTER — Other Ambulatory Visit: Payer: Self-pay | Admitting: Internal Medicine

## 2013-03-21 NOTE — Telephone Encounter (Signed)
Samples up front and called thn for help.

## 2013-03-21 NOTE — Telephone Encounter (Signed)
Pt would like samples of pradaxa 150 mg. Pt can not afford medication for 90 day supply is 788.00 mailorder. Pt may need to be switch to an  Different med.

## 2013-04-10 ENCOUNTER — Telehealth: Payer: Self-pay | Admitting: Oncology

## 2013-04-10 ENCOUNTER — Ambulatory Visit (HOSPITAL_BASED_OUTPATIENT_CLINIC_OR_DEPARTMENT_OTHER): Payer: Medicare Other | Admitting: Oncology

## 2013-04-10 VITALS — BP 136/85 | HR 74 | Temp 98.1°F | Resp 18 | Ht 64.5 in | Wt 190.6 lb

## 2013-04-10 DIAGNOSIS — I4891 Unspecified atrial fibrillation: Secondary | ICD-10-CM

## 2013-04-10 DIAGNOSIS — C8510 Unspecified B-cell lymphoma, unspecified site: Secondary | ICD-10-CM

## 2013-04-10 DIAGNOSIS — C8589 Other specified types of non-Hodgkin lymphoma, extranodal and solid organ sites: Secondary | ICD-10-CM

## 2013-04-10 NOTE — Progress Notes (Signed)
   [facility]    OFFICE PROGRESS NOTE   INTERVAL HISTORY:   She returns as scheduled. She feels well. Good appetite. No fever or night sweats. No pain.  Objective:  Vital signs in last 24 hours:  Blood pressure 136/85, pulse 74, temperature 98.1 F (36.7 C), temperature source Oral, resp. rate 18, height 5' 4.5" (1.638 m), weight 190 lb 9.6 oz (86.456 kg).    HEENT: Neck without mass Lymphatics: No cervical, supraclavicular, axillary, or inguinal nodes Resp: Lungs clear bilaterally, no respiratory distress Cardio: Regular rate and rhythm GI: No hepatosplenomegaly, no mass Vascular: No leg edema   Medications: I have reviewed the patient's current medications.  Assessment/Plan: 1. Non-Hodgkin's lymphoma, large B-cell lymphoma, CD20 positive involving 8 needle core biopsy of a right iliac lymph node 02/12/2012. CT of the abdomen and pelvis on 01/15/2012 showed abdominal/pelvic lymphadenopathy. Serum LDH in normal range on 02/25/2012. Staging PET scan 02/25/2012 with a small lymph node posteriolateral to the oropharynx on the left measuring 9 x 7 mm with SUV 5.4; large cluster of hypermetabolic lymph nodes in the right hilum with SUV 4.3; cluster of mildly enlarged hepatoduodenal ligament lymph nodes and mildly enlarged portacaval lymph node with hypermetabolic activity (SUV max 37.1); numerous borderline enlarged and mildly enlarged periaortic retroperitoneal lymph nodes with extensive hypermetabolic activity (SUV max 43.6-47.6); numerous borderline enlarged and mildly enlarged hypermetabolic lymph nodes along the pelvic sidewall bilaterally most pronounced on the right with a right external iliac lymph node measuring 2.9 x 4.1 cm (SUV max 52.5). Bone marrow aspiration/biopsy 03/09/2012 negative for involvement with lymphoma. Status post cycle 1 of CHOP-Rituxan on 03/15/2012. She completed cycle 3 on 05/04/2012 and cycle 4 on 05/25/2012. Restaging PET scan on 06/10/2012  was markedly improved with resolution of the thoracic and retroperitoneal adenopathy and near complete resolution of porta hepatis adenopathy. She completed cycle 6 of CHOP/Rituxan on 07/06/2012. 2. History of night sweats likely secondary to #1. 3. Rectal bleeding June 2013 status post negative colonoscopy 01/28/2012 and upper endoscopy 02/11/2012. The bleeding was likely related to hemorrhoids. 4. COPD. 5. Remote history of larynx cancer. 6. Status post Port-A-Cath placement 03/09/2012. Removed 09/13/2012. 7. 2-D echo 03/11/2012 with left ventricular ejection fraction 60-65%. 8. Admission with febrile neutropenia 03/22/2012 with no source for infection identified 9. Admission with fever and a chest x-ray consistent with "pneumonia"on 03/27/2012. She completed a course of Levaquin. 10. Pancytopenia following cycle 1 of CHOP-rituximab-resolved. 11. History of atrial fibrillation-maintained on Pradaxa 12. Numbness/tingling in the fingertips and toes. The numbness in the toes predated the start of chemotherapy.. She also reports the tingling in the fingertips was present prior to the start of chemotherapy. The vincristine was dose reduced with cycle 2. Vincristine was discontinued beginning with cycle 3.    Disposition:  Ms. Pincus remains in clinical remission from non-Hodgkin's lymphoma. She will return for an office visit in 4 months. I encouraged her to stay up-to-date on the influenza and pneumococcal vaccines.   Thornton Papas, MD  04/10/2013  1:15 PM

## 2013-04-10 NOTE — Telephone Encounter (Signed)
Gave pt appt for ML only on January 2015

## 2013-04-21 NOTE — Telephone Encounter (Signed)
error 

## 2013-05-01 ENCOUNTER — Ambulatory Visit: Payer: Medicare Other | Admitting: Internal Medicine

## 2013-05-05 ENCOUNTER — Ambulatory Visit (INDEPENDENT_AMBULATORY_CARE_PROVIDER_SITE_OTHER): Payer: Medicare Other | Admitting: Internal Medicine

## 2013-05-05 ENCOUNTER — Encounter: Payer: Self-pay | Admitting: Internal Medicine

## 2013-05-05 VITALS — BP 124/80 | HR 88 | Temp 98.7°F | Resp 16 | Ht 64.5 in | Wt 191.0 lb

## 2013-05-05 DIAGNOSIS — M25569 Pain in unspecified knee: Secondary | ICD-10-CM

## 2013-05-05 DIAGNOSIS — Z23 Encounter for immunization: Secondary | ICD-10-CM

## 2013-05-05 DIAGNOSIS — G8929 Other chronic pain: Secondary | ICD-10-CM

## 2013-05-05 DIAGNOSIS — Z96659 Presence of unspecified artificial knee joint: Secondary | ICD-10-CM

## 2013-05-05 NOTE — Addendum Note (Signed)
Addended by: Willy Eddy on: 05/05/2013 05:28 PM   Modules accepted: Orders

## 2013-05-05 NOTE — Progress Notes (Signed)
  Subjective:    Patient ID: [name], female    DOB: Aug 26, 1927, 79 y.o.   MRN: [id number]  HPI    Review of Systems     Objective:   Physical Exam        Assessment & Plan:  Trial of VESIcare for overactive bladder

## 2013-05-05 NOTE — Progress Notes (Signed)
Subjective:    Patient ID: [name], female    DOB: March 25, 1928, 79 y.o.   MRN: [id number]  HPI The patient will require a second pneumonia shot.  The patient is followed for hypertension a history of intermittent atrial fibrillation and hyperlipidemia. He has significant degenerative arthritis of the knees in the past she has received a series of Synvisc injections that did help following that she had a total knee replacement she has significant pain in the knee that she had the total knee replacement.  Rash  Due to venous stasis      Review of Systems  Constitutional: Negative for activity change, appetite change and fatigue.  HENT: Positive for congestion and rhinorrhea. Negative for ear pain, neck pain, postnasal drip and sinus pressure.   Eyes: Negative for redness and visual disturbance.  Respiratory: Positive for shortness of breath. Negative for cough and wheezing.   Gastrointestinal: Negative for abdominal pain and abdominal distention.  Genitourinary: Negative for dysuria, frequency and menstrual problem.  Musculoskeletal: Positive for arthralgias and gait problem. Negative for myalgias and joint swelling.  Skin: Negative for rash and wound.  Neurological: Negative for dizziness, weakness and headaches.  Hematological: Negative for adenopathy. Does not bruise/bleed easily.  Psychiatric/Behavioral: Negative for sleep disturbance and decreased concentration.   Past Medical History  Diagnosis Date  . GERD (gastroesophageal reflux disease)   . Arthritis   . Stroke 10/2002    no residual  . History of TIAs   . Diverticulosis   . PUD (peptic ulcer disease)   . PVD (peripheral vascular disease)   . Hyperlipidemia   . Anxiety   . Depression   . 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 Blood transfusion   . COPD (chronic obstructive pulmonary disease)   . Lymphona, mantle cell, inguinal region/lower limb      History   Social History  . Marital Status: Married    Spouse Name: N/A    Number of Children: 6  . Years of Education: N/A   Occupational History  . retired    Social History Main Topics  . Smoking status: Former Games developer  . Smokeless tobacco: Never Used  . Alcohol Use: No  . Drug Use: No  . Sexual Activity: Yes   Other Topics Concern  . Not on file   Social History Narrative  . No narrative on file    Past Surgical History  Procedure Laterality Date  . Appendectomy    . Colon surgery  09/13/2008    iliostomy for diverticulitis  . Hernia repair    . Tubal ligation    . Throat surgery  1986  . Total knee arthroplasty      2002 left 2008 right  . Reversal of iliostomy  01/2010  . Cataract extraction  2005    bilateral  . Carotid surgery      2004 a month apart right and left    Family History  Problem Relation Age of Onset  . Colon cancer Sister     colon  . Hyperlipidemia Mother   . Stroke Mother   . Cancer Father     mesothelioma    Allergies  Allergen Reactions  . Propoxyphene Hcl   . Propoxyphene-Acetaminophen     REACTION: Reaction not known    Current Outpatient Prescriptions on File Prior to Visit  Medication Sig Dispense Refill  . ALPRAZolam (XANAX) 0.5 MG tablet       .  bisoprolol (ZEBETA) 5 MG tablet Take 5 mg by mouth daily at 12 noon.       . clotrimazole-betamethasone (LOTRISONE) lotion APPLY TOPICALLY TWICE DAILY.  30 mL  3  . dabigatran (PRADAXA) 150 MG CAPS Take 1 capsule (150 mg total) by mouth every 12 (twelve) hours.  60 capsule  3  . diclofenac sodium (VOLTAREN) 1 % GEL Apply 2 g topically 4 (four) times daily.  100 g  11  . Vitamin D, Ergocalciferol, (DRISDOL) 50000 UNITS CAPS Take 50,000 Units by mouth every Sunday.       No current facility-administered medications on file prior to visit.    BP 124/80  Pulse 88  Temp(Src) 98.7 F (37.1 C)  Resp 16  Ht 5' 4.5" (1.638 m)  Wt 191 lb (86.637 kg)  BMI 32.29 kg/m2        Objective:   Physical Exam  Nursing note and vitals reviewed. Constitutional: She appears well-developed and well-nourished.  HENT:  Head: Normocephalic and atraumatic.  Eyes: Conjunctivae are normal.  Pulmonary/Chest: Effort normal and breath sounds normal.  Musculoskeletal: She exhibits edema and tenderness.          Assessment & Plan:  Pain in the knee that had a TKR ( Allusio did knee) Saw another orthopedist and xrays were reported to be "normal" She was told that the low back may be contibuting Still has significant pain  Needs to wear support hose Consider triple  scan to see if the knee prosthesis is in place

## 2013-05-05 NOTE — Patient Instructions (Addendum)
The patient is instructed to continue all medications as prescribed. Schedule followup with check out clerk upon leaving the clinic  

## 2013-05-12 ENCOUNTER — Other Ambulatory Visit: Payer: Self-pay | Admitting: Internal Medicine

## 2013-05-15 ENCOUNTER — Other Ambulatory Visit: Payer: Self-pay | Admitting: *Deleted

## 2013-05-15 MED ORDER — ALPRAZOLAM 0.5 MG PO TABS: 0.5000 mg | ORAL_TABLET | Freq: Every evening | ORAL | Status: DC | PRN

## 2013-05-19 ENCOUNTER — Ambulatory Visit (INDEPENDENT_AMBULATORY_CARE_PROVIDER_SITE_OTHER): Payer: Medicare Other | Admitting: Family Medicine

## 2013-05-19 ENCOUNTER — Encounter: Payer: Self-pay | Admitting: Family Medicine

## 2013-05-19 ENCOUNTER — Telehealth: Payer: Self-pay | Admitting: Internal Medicine

## 2013-05-19 VITALS — BP 128/80 | HR 73 | Temp 98.0°F | Wt 190.0 lb

## 2013-05-19 DIAGNOSIS — R4189 Other symptoms and signs involving cognitive functions and awareness: Secondary | ICD-10-CM

## 2013-05-19 DIAGNOSIS — R5381 Other malaise: Secondary | ICD-10-CM

## 2013-05-19 LAB — POCT URINALYSIS DIPSTICK
Bilirubin, UA: NEGATIVE
Glucose, UA: NEGATIVE
Ketones, UA: NEGATIVE
Spec Grav, UA: 1.02
Urobilinogen, UA: 1

## 2013-05-19 MED ORDER — CIPROFLOXACIN HCL 500 MG PO TABS: 500.0000 mg | ORAL_TABLET | Freq: Two times a day (BID) | ORAL | Status: DC

## 2013-05-19 NOTE — Telephone Encounter (Signed)
noted 

## 2013-05-19 NOTE — Progress Notes (Signed)
Subjective:    Patient ID: [name], female    DOB: [birth year], 79 y.o.   MRN: [id number]  HPI Patient seen as a workin with nonspecific symptoms of fatigue for the past couple days. She has multiple chronic problems including history of peripheral vascular disease, osteoarthritis, remote history of large B-cell lymphoma, hypertension, hyperlipidemia, atrial fibrillation.  Family related that Wednesday night she apparently fell asleep with food in her mouth. No reported syncope. There is no history of any reported dysphagia. They think she has had possibly some slightly slowed cognition over the past few days. No slurred speech. No focal weakness. She has some balance problems in general and is getting physical therapy for that but those symptoms are not new.  She's had some nonspecific malaise. Denies specific urinary symptoms but daughter states she was like this once before with urinary tract infection. She is having no difficulty swallowing. No burning with urination. No fever. Good appetite. Denies headache. Denies recent fall.  She was having some urinary urgency and reportedly placed on VESIcare recently. She's had occasional constipation-since starting VESIcare. Last bowel movement 2 days ago. She takes pradaxa for atrial fibrillation. No recent chest pains. No dyspnea. She had recent normal thyroid functions.  She has a remote history of laryngeal cancer and has some chronic hoarseness  Past Medical History  Diagnosis Date  . GERD (gastroesophageal reflux disease)   . Arthritis   . Stroke 10/2002    no residual  . History of TIAs   . Diverticulosis   . PUD (peptic ulcer disease)   . PVD (peripheral vascular disease)   . Hyperlipidemia   . Anxiety   . Depression   . 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 Blood transfusion   . COPD (chronic obstructive pulmonary disease)   . Lymphona, mantle cell, inguinal region/lower  limb    Past Surgical History  Procedure Laterality Date  . Appendectomy    . Colon surgery  09/13/2008    iliostomy for diverticulitis  . Hernia repair    . Tubal ligation    . Throat surgery  1986  . Total knee arthroplasty      2002 left 2008 right  . Reversal of iliostomy  01/2010  . Cataract extraction  2005    bilateral  . Carotid surgery      2004 a month apart right and left    reports that she has quit smoking. She has never used smokeless tobacco. She reports that she does not drink alcohol or use illicit drugs. family history includes Cancer in her father; Colon cancer in her sister; Hyperlipidemia in her mother; Stroke in her mother. Allergies  Allergen Reactions  . Propoxyphene Hcl   . Propoxyphene-Acetaminophen     REACTION: Reaction not known      Review of Systems  Constitutional: Positive for fatigue. Negative for fever, chills, appetite change and unexpected weight change.  Respiratory: Negative for cough and shortness of breath.   Cardiovascular: Negative for chest pain.  Gastrointestinal: Positive for constipation. Negative for abdominal pain.  Endocrine: Negative for polydipsia and polyuria.  Genitourinary: Negative for dysuria.  Neurological: Negative for dizziness, seizures and speech difficulty.  Psychiatric/Behavioral: Negative for dysphoric mood.       Objective:   Physical Exam  Constitutional: She is oriented to person, place, and time. She appears well-developed and well-nourished.  HENT:  Right Ear: External ear normal.  Left Ear: External ear  normal.  Mouth/Throat: Oropharynx is clear and moist.  Neck: Neck supple. No thyromegaly present.  Cardiovascular: Normal rate.   Pulmonary/Chest: Effort normal and breath sounds normal. No respiratory distress. She has no wheezes. She has no rales.  Abdominal: Soft. There is no tenderness. There is no guarding.  Musculoskeletal: She exhibits no edema.  Lymphadenopathy:    She has no cervical  adenopathy.  Neurological: She is alert and oriented to person, place, and time. No cranial nerve deficit.  Cerebellar function normal. Gait normal. No focal weakness. Normal gag reflex.  Skin: No rash noted.  Psychiatric: She has a normal mood and affect. Her behavior is normal.          Assessment & Plan:  Patient presents with nonspecific symptoms of fatigue and possible mild cognitive slowing though she is answering all questions appropriately and has nonfocal neurologic exam. Urine dipstick reveals blood and 2+ leukocytes. Urine culture sent. Cipro 500 mg twice a day for 7 days. Hold VESIcare for now. Drink plenty of fluids. Consider stool softener.

## 2013-05-19 NOTE — Telephone Encounter (Signed)
Patient Information:  Caller Name: Shanda Bumps  Phone: [phone]  Patient: [name]  Gender: Female  DOB: 03/11/2008  Age: 79 Years  PCP: Alycia Rossetti, Amy  Office Follow Up:  Does the office need to follow up with this patient?: No  Instructions For The Office: N/A   Symptoms  Reason For Call & Symptoms: Mom is calling about the pt having hard stools over the past 2 days. Pt strained yesterday and they had to help with a stuck stool. Pt had a drop of blood. Pt eats whole grains and fruits and vegis.  Reviewed Health History In EMR: Yes  Reviewed Medications In EMR: Yes  Reviewed Allergies In EMR: Yes  Reviewed Surgeries / Procedures: Yes  Date of Onset of Symptoms: 05/17/2013  Weight: N/A  Guideline(s) Used:  Constipation  Disposition Per Guideline:   See Within 3 Days in Office  Reason For Disposition Reached:   Pain or crying with passage of stools and occurs 3 or more times  Advice Given:  N/A  Patient Will Follow Care Advice:  YES  Appointment Scheduled:  05/19/2013 13:45:00 Appointment Scheduled Provider:  Joellyn Rued

## 2013-05-19 NOTE — Telephone Encounter (Signed)
Just an FYI

## 2013-05-19 NOTE — Patient Instructions (Signed)
Drink plenty of fluids Follow up promptly for any fever or increased confusion or focal weakness Hold Vesicare for now.

## 2013-05-19 NOTE — Telephone Encounter (Signed)
Patient coming in at 4:00 pm today

## 2013-05-21 LAB — URINE CULTURE: Colony Count: 9000

## 2013-05-24 ENCOUNTER — Emergency Department (HOSPITAL_COMMUNITY): Payer: Medicare Other

## 2013-05-24 ENCOUNTER — Encounter: Payer: Self-pay | Admitting: Family Medicine

## 2013-05-24 ENCOUNTER — Emergency Department (HOSPITAL_COMMUNITY)
Admission: EM | Admit: 2013-05-24 | Discharge: 2013-05-24 | Disposition: A | Discharge status: Home / Self Care | Payer: Medicare Other | Attending: Emergency Medicine | Admitting: Emergency Medicine

## 2013-05-24 ENCOUNTER — Telehealth: Payer: Self-pay | Admitting: Internal Medicine

## 2013-05-24 ENCOUNTER — Other Ambulatory Visit: Payer: Self-pay | Admitting: *Deleted

## 2013-05-24 ENCOUNTER — Encounter (HOSPITAL_COMMUNITY): Payer: Self-pay | Admitting: Emergency Medicine

## 2013-05-24 DIAGNOSIS — Z87891 Personal history of nicotine dependence: Secondary | ICD-10-CM | POA: Insufficient documentation

## 2013-05-24 DIAGNOSIS — Z79899 Other long term (current) drug therapy: Secondary | ICD-10-CM | POA: Insufficient documentation

## 2013-05-24 DIAGNOSIS — Z8639 Personal history of other endocrine, nutritional and metabolic disease: Secondary | ICD-10-CM | POA: Insufficient documentation

## 2013-05-24 DIAGNOSIS — J449 Chronic obstructive pulmonary disease, unspecified: Secondary | ICD-10-CM | POA: Insufficient documentation

## 2013-05-24 DIAGNOSIS — F329 Major depressive disorder, single episode, unspecified: Secondary | ICD-10-CM | POA: Insufficient documentation

## 2013-05-24 DIAGNOSIS — Z8673 Personal history of transient ischemic attack (TIA), and cerebral infarction without residual deficits: Secondary | ICD-10-CM | POA: Insufficient documentation

## 2013-05-24 DIAGNOSIS — J4489 Other specified chronic obstructive pulmonary disease: Secondary | ICD-10-CM | POA: Insufficient documentation

## 2013-05-24 DIAGNOSIS — R4789 Other speech disturbances: Secondary | ICD-10-CM | POA: Insufficient documentation

## 2013-05-24 DIAGNOSIS — R05 Cough: Secondary | ICD-10-CM | POA: Insufficient documentation

## 2013-05-24 DIAGNOSIS — Z792 Long term (current) use of antibiotics: Secondary | ICD-10-CM | POA: Insufficient documentation

## 2013-05-24 DIAGNOSIS — Z8679 Personal history of other diseases of the circulatory system: Secondary | ICD-10-CM | POA: Insufficient documentation

## 2013-05-24 DIAGNOSIS — Z8521 Personal history of malignant neoplasm of larynx: Secondary | ICD-10-CM | POA: Insufficient documentation

## 2013-05-24 DIAGNOSIS — Z8711 Personal history of peptic ulcer disease: Secondary | ICD-10-CM | POA: Insufficient documentation

## 2013-05-24 DIAGNOSIS — Z862 Personal history of diseases of the blood and blood-forming organs and certain disorders involving the immune mechanism: Secondary | ICD-10-CM | POA: Insufficient documentation

## 2013-05-24 DIAGNOSIS — F29 Unspecified psychosis not due to a substance or known physiological condition: Secondary | ICD-10-CM | POA: Insufficient documentation

## 2013-05-24 DIAGNOSIS — F411 Generalized anxiety disorder: Secondary | ICD-10-CM | POA: Insufficient documentation

## 2013-05-24 DIAGNOSIS — IMO0002 Reserved for concepts with insufficient information to code with codable children: Secondary | ICD-10-CM | POA: Insufficient documentation

## 2013-05-24 DIAGNOSIS — Z856 Personal history of leukemia: Secondary | ICD-10-CM | POA: Insufficient documentation

## 2013-05-24 DIAGNOSIS — Z791 Long term (current) use of non-steroidal anti-inflammatories (NSAID): Secondary | ICD-10-CM | POA: Insufficient documentation

## 2013-05-24 DIAGNOSIS — R410 Disorientation, unspecified: Secondary | ICD-10-CM

## 2013-05-24 DIAGNOSIS — F3289 Other specified depressive episodes: Secondary | ICD-10-CM | POA: Insufficient documentation

## 2013-05-24 DIAGNOSIS — M129 Arthropathy, unspecified: Secondary | ICD-10-CM | POA: Insufficient documentation

## 2013-05-24 DIAGNOSIS — E785 Hyperlipidemia, unspecified: Secondary | ICD-10-CM | POA: Insufficient documentation

## 2013-05-24 DIAGNOSIS — Z8719 Personal history of other diseases of the digestive system: Secondary | ICD-10-CM | POA: Insufficient documentation

## 2013-05-24 DIAGNOSIS — R059 Cough, unspecified: Secondary | ICD-10-CM | POA: Insufficient documentation

## 2013-05-24 DIAGNOSIS — Z7902 Long term (current) use of antithrombotics/antiplatelets: Secondary | ICD-10-CM | POA: Insufficient documentation

## 2013-05-24 DIAGNOSIS — J029 Acute pharyngitis, unspecified: Secondary | ICD-10-CM | POA: Insufficient documentation

## 2013-05-24 LAB — CBC WITH DIFFERENTIAL/PLATELET
Basophils Absolute: 0 10*3/uL (ref 0.0–0.1)
Eosinophils Absolute: 0.2 10*3/uL (ref 0.0–0.7)
Eosinophils Relative: 3 % (ref 0–5)
Hemoglobin: 13.8 g/dL (ref 12.0–15.0)
Lymphocytes Relative: 20 % (ref 12–46)
Lymphs Abs: 1.2 10*3/uL (ref 0.7–4.0)
Monocytes Absolute: 0.6 10*3/uL (ref 0.1–1.0)
Neutrophils Relative %: 67 % (ref 43–77)
Platelets: 211 10*3/uL (ref 150–400)
RBC: 4.76 MIL/uL (ref 3.87–5.11)
RDW: 14.1 % (ref 11.5–15.5)
WBC: 5.9 10*3/uL (ref 4.0–10.5)

## 2013-05-24 LAB — URINALYSIS, ROUTINE W REFLEX MICROSCOPIC
Bilirubin Urine: NEGATIVE
Hgb urine dipstick: NEGATIVE
Ketones, ur: NEGATIVE mg/dL
Leukocytes, UA: NEGATIVE
Nitrite: NEGATIVE
Specific Gravity, Urine: 1.013 (ref 1.005–1.030)
pH: 7 (ref 5.0–8.0)

## 2013-05-24 LAB — POCT I-STAT TROPONIN I: Troponin i, poc: 0.02 ng/mL (ref 0.00–0.08)

## 2013-05-24 LAB — COMPREHENSIVE METABOLIC PANEL
ALT: 33 U/L (ref 0–35)
AST: 33 U/L (ref 0–37)
Alkaline Phosphatase: 78 U/L (ref 39–117)
CO2: 27 mEq/L (ref 19–32)
Calcium: 10.1 mg/dL (ref 8.4–10.5)
GFR calc Af Amer: 49 mL/min — ABNORMAL LOW (ref >90)
Glucose, Bld: 129 mg/dL — ABNORMAL HIGH (ref 70–99)
Potassium: 3.9 mEq/L (ref 3.5–5.1)
Sodium: 135 mEq/L (ref 135–145)
Total Protein: 7.3 g/dL (ref 6.0–8.3)

## 2013-05-24 LAB — TROPONIN I: Troponin I: <0.3 ng/mL (ref <0.30)

## 2013-05-24 LAB — APTT: aPTT: 61 seconds — ABNORMAL HIGH (ref 24–37)

## 2013-05-24 LAB — GLUCOSE, CAPILLARY: Glucose-Capillary: 108 mg/dL — ABNORMAL HIGH (ref 70–99)

## 2013-05-24 MED ORDER — CEFUROXIME AXETIL 500 MG PO TABS: 500.0000 mg | ORAL_TABLET | Freq: Two times a day (BID) | ORAL | Status: DC

## 2013-05-24 NOTE — ED Provider Notes (Signed)
CSN: [id number]     Arrival date & time 05/24/13  1608 History   First MD Initiated Contact with Patient 05/24/13 1723     Chief Complaint  Patient presents with  . Altered Mental Status    Patient is a 79 y.o. female presenting with altered mental status.  Altered Mental Status Presenting symptoms: behavior changes   Presenting symptoms comment:  Disconnected, getting agitated, memory is foggy Severity:  Moderate Most recent episode:  More than 2 days ago Progression:  Waxing and waning Chronicity:  New Context: not head injury   Associated symptoms: agitation   Associated symptoms: no fever, no headaches and no vomiting   Family states she has not been herself for the last few days. Pt has had stroke before but the symptoms were not the same.   Past Medical History  Diagnosis Date  . GERD (gastroesophageal reflux disease)   . Arthritis   . Stroke 10/2002    no residual  . History of TIAs   . Diverticulosis   . PUD (peptic ulcer disease)   . PVD (peripheral vascular disease)   . Hyperlipidemia   . Anxiety   . Depression   . 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 Blood transfusion   . COPD (chronic obstructive pulmonary disease)   . Lymphona, mantle cell, inguinal region/lower limb    Past Surgical History  Procedure Laterality Date  . Appendectomy    . Colon surgery  09/13/2008    iliostomy for diverticulitis  . Hernia repair    . Tubal ligation    . Throat surgery  1986  . Total knee arthroplasty      2002 left 2008 right  . Reversal of iliostomy  01/2010  . Cataract extraction  2005    bilateral  . Carotid surgery      2004 a month apart right and left   Family History  Problem Relation Age of Onset  . Colon cancer Sister     colon  . Hyperlipidemia Mother   . Stroke Mother   . Cancer Father     mesothelioma   History  Substance Use Topics  . Smoking status: Former Games developer  . Smokeless tobacco: Never  Used  . Alcohol Use: No   OB History   Grav Para Term Preterm Abortions TAB SAB Ect Mult Living                 Review of Systems  Constitutional: Negative for fever.  HENT: Positive for sore throat.        Some recent difficulty swallowing  Respiratory: Positive for cough.   Gastrointestinal: Negative for vomiting and diarrhea.  Genitourinary:       Treated for possible uti recently  Neurological: Positive for speech difficulty. Negative for headaches.  Psychiatric/Behavioral: Positive for agitation.    Allergies  Propoxyphene hcl and Propoxyphene-acetaminophen  Home Medications   Current Outpatient Rx  Name  Route  Sig  Dispense  Refill  . ALPRAZolam (XANAX) 0.5 MG tablet   Oral   Take 1 tablet (0.5 mg total) by mouth at bedtime as needed for sleep.   30 tablet   3   . bisoprolol (ZEBETA) 5 MG tablet   Oral   Take 5 mg by mouth daily at 12 noon.          . ciprofloxacin (CIPRO) 500 MG tablet   Oral   Take 1 tablet (500 mg  total) by mouth 2 (two) times daily.   14 tablet   0   . clotrimazole-betamethasone (LOTRISONE) lotion      APPLY TOPICALLY TWICE DAILY.   30 mL   3   . Cyanocobalamin (VITAMIN B-12 PO)   Oral   Take 1 tablet by mouth daily.         . dabigatran (PRADAXA) 150 MG CAPS   Oral   Take 1 capsule (150 mg total) by mouth every 12 (twelve) hours.   60 capsule   3   . diclofenac sodium (VOLTAREN) 1 % GEL   Topical   Apply 2 g topically 4 (four) times daily.   100 g   11   . Multiple Vitamin (MULTIVITAMIN WITH MINERALS) TABS tablet   Oral   Take 1 tablet by mouth daily.         . Vitamin D, Ergocalciferol, (DRISDOL) 50000 UNITS CAPS   Oral   Take 50,000 Units by mouth every Sunday.         . cefUROXime (CEFTIN) 500 MG tablet   Oral   Take 1 tablet (500 mg total) by mouth 2 (two) times daily.   14 tablet   0    BP 169/73  Pulse 84  Temp(Src) 98.1 F (36.7 C) (Oral)  Resp 16  SpO2 96% Physical Exam  Nursing note  and vitals reviewed. Constitutional: She is oriented to person, place, and time. She appears well-developed and well-nourished. No distress.  HENT:  Head: Normocephalic and atraumatic.  Right Ear: External ear normal.  Left Ear: External ear normal.  Mouth/Throat: Oropharynx is clear and moist. No oropharyngeal exudate (mm dry).  Voice slightly hoarse  Eyes: Conjunctivae are normal. Right eye exhibits no discharge. Left eye exhibits no discharge. No scleral icterus.  Neck: Neck supple. No tracheal deviation present.  Cardiovascular: Normal rate, regular rhythm and intact distal pulses.   Pulmonary/Chest: Effort normal and breath sounds normal. No stridor. No respiratory distress. She has no wheezes. She has no rales.  Abdominal: Soft. Bowel sounds are normal. She exhibits no distension. There is no tenderness. There is no rebound and no guarding.  Musculoskeletal: She exhibits no edema and no tenderness.  Neurological: She is alert and oriented to person, place, and time. She has normal strength. She is not disoriented. No cranial nerve deficit ( no gross defecits noted) or sensory deficit. She exhibits normal muscle tone. She displays no seizure activity. Coordination normal. GCS eye subscore is 4. GCS verbal subscore is 5. GCS motor subscore is 6.  No pronator drift bilateral upper extrem, able to hold both legs off bed for 5 seconds, sensation intact in all extremities, no visual field cuts, no left or right sided neglect  Skin: Skin is warm and dry. No rash noted.  Psychiatric: She has a normal mood and affect.    ED Course  Procedures (including critical care time) Labs Review Labs Reviewed  COMPREHENSIVE METABOLIC PANEL - Abnormal; Notable for the following:    Glucose, Bld 129 (*)    Creatinine, Ser 1.17 (*)    GFR calc non Af Amer 42 (*)    GFR calc Af Amer 49 (*)    All other components within normal limits  PROTIME-INR - Abnormal; Notable for the following:    Prothrombin  Time 17.4 (*)    All other components within normal limits  APTT - Abnormal; Notable for the following:    aPTT 61 (*)    All other components  within normal limits  GLUCOSE, CAPILLARY - Abnormal; Notable for the following:    Glucose-Capillary 108 (*)    All other components within normal limits  CBC WITH DIFFERENTIAL  URINALYSIS, ROUTINE W REFLEX MICROSCOPIC  TROPONIN I  POCT I-STAT TROPONIN I  POCT I-STAT TROPONIN I   Imaging Review Ct Head Wo Contrast  05/24/2013   CLINICAL DATA:  Altered mental status ; tingling in both upper extremities  EXAM: CT HEAD WITHOUT CONTRAST  TECHNIQUE: Contiguous axial images were obtained from the base of the skull through the vertex without intravenous contrast. Study was obtained within 24 hr of patient's arrival at the emergency department.  COMPARISON:  Brain CT June 09, 2008 and brain MRI October 20, 2008  FINDINGS: There is mild diffuse atrophy. There is no mass, hemorrhage, extra-axial fluid collection, or midline shift. There is small vessel disease throughout the centra semiovale bilaterally. There is evidence of a prior small infarct in the left external capsule. There is no acute appearing infarct.  Bony calvarium appears intact. The mastoid air cells are clear.  IMPRESSION: Atrophy with small vessel disease. Prior infarct left external capsule. No intracranial mass, hemorrhage, or acute appearing infarct.   Electronically Signed   By: Bretta Bang M.D.   On: 05/24/2013 18:38    EKG Interpretation     Ventricular Rate:  71 PR Interval:  176 QRS Duration: 87 QT Interval:  393 QTC Calculation: 428 R Axis:   61 Text Interpretation:  Normal sinus rhythm Premature atrial complexes            MDM   1. Confusion    Negative workup in the ED. Cannot exclude stroke TIA although not convinced that is the etiology of her symptoms.  I discussed admission for observation and further workup versus outpatient followup. Patient does not  want to have an MRI done this evening she also does not want to stay in the hospital. She would like to follow up with her primary Dr. this week..  The patient and family understand the warning signs and precautions.    Celene Kras, MD 05/24/13 2006

## 2013-05-24 NOTE — Telephone Encounter (Signed)
Per dr Lovell Sheehan- change antibioitc to ceftin 500 bid -daughter informed and med sent in

## 2013-05-24 NOTE — Telephone Encounter (Signed)
Talked with daughter and she is more concerned about mental status changes- ov with padonda on Friday and take to ed if sx worsens

## 2013-05-24 NOTE — ED Notes (Addendum)
Pt's family states that since Friday pt has been slow to respond and not acting herself. Saw PCP that stated she had blood and pus in urine but urine culture was negative? Tingling in both hands. Loss of appetite. Neuro intact.

## 2013-05-24 NOTE — Telephone Encounter (Signed)
Pt is not feeling better since seeing Dr. Caryl Never on Friday.  She was told she had high levels of puss cells and blood in urine.  She was given an antibiotic for possible uti.  However culture was normal.  Pt has hx of lymphoma is confused and crying uncontrollable.  No fever.  Daughter wants to know what she should do since she can'g get in to see PCP.  Should she be taken to ED?

## 2013-05-25 ENCOUNTER — Telehealth: Payer: Self-pay | Admitting: Internal Medicine

## 2013-05-25 NOTE — Telephone Encounter (Signed)
Daughter called stating they took her mom to ed and she is better today.  Thinks maybe tia

## 2013-05-25 NOTE — Telephone Encounter (Signed)
Pt daughter would like bonnye to return her call concerning her mother

## 2013-05-26 ENCOUNTER — Inpatient Hospital Stay (HOSPITAL_COMMUNITY)
Admission: EM | Admit: 2013-05-26 | Discharge: 2013-05-29 | DRG: 065 | Disposition: A | Discharge status: Home / Self Care | Payer: Medicare Other | Attending: Internal Medicine | Admitting: Internal Medicine

## 2013-05-26 ENCOUNTER — Encounter (HOSPITAL_COMMUNITY): Payer: Self-pay | Admitting: Emergency Medicine

## 2013-05-26 ENCOUNTER — Emergency Department (HOSPITAL_COMMUNITY): Payer: Medicare Other

## 2013-05-26 ENCOUNTER — Ambulatory Visit (INDEPENDENT_AMBULATORY_CARE_PROVIDER_SITE_OTHER): Payer: Medicare Other | Admitting: Family

## 2013-05-26 ENCOUNTER — Encounter: Payer: Self-pay | Admitting: Family

## 2013-05-26 VITALS — BP 120/76 | HR 81 | Wt 193.0 lb

## 2013-05-26 DIAGNOSIS — R4701 Aphasia: Secondary | ICD-10-CM

## 2013-05-26 DIAGNOSIS — R5381 Other malaise: Secondary | ICD-10-CM

## 2013-05-26 DIAGNOSIS — Z87891 Personal history of nicotine dependence: Secondary | ICD-10-CM

## 2013-05-26 DIAGNOSIS — R609 Edema, unspecified: Secondary | ICD-10-CM

## 2013-05-26 DIAGNOSIS — E860 Dehydration: Secondary | ICD-10-CM

## 2013-05-26 DIAGNOSIS — R197 Diarrhea, unspecified: Secondary | ICD-10-CM

## 2013-05-26 DIAGNOSIS — I10 Essential (primary) hypertension: Secondary | ICD-10-CM

## 2013-05-26 DIAGNOSIS — I739 Peripheral vascular disease, unspecified: Secondary | ICD-10-CM

## 2013-05-26 DIAGNOSIS — I6350 Cerebral infarction due to unspecified occlusion or stenosis of unspecified cerebral artery: Principal | ICD-10-CM | POA: Diagnosis present

## 2013-05-26 DIAGNOSIS — J4489 Other specified chronic obstructive pulmonary disease: Secondary | ICD-10-CM

## 2013-05-26 DIAGNOSIS — M19049 Primary osteoarthritis, unspecified hand: Secondary | ICD-10-CM

## 2013-05-26 DIAGNOSIS — Z800 Family history of malignant neoplasm of digestive organs: Secondary | ICD-10-CM

## 2013-05-26 DIAGNOSIS — M542 Cervicalgia: Secondary | ICD-10-CM

## 2013-05-26 DIAGNOSIS — I951 Orthostatic hypotension: Secondary | ICD-10-CM | POA: Diagnosis present

## 2013-05-26 DIAGNOSIS — K5733 Diverticulitis of large intestine without perforation or abscess with bleeding: Secondary | ICD-10-CM

## 2013-05-26 DIAGNOSIS — R509 Fever, unspecified: Secondary | ICD-10-CM

## 2013-05-26 DIAGNOSIS — Z8579 Personal history of other malignant neoplasms of lymphoid, hematopoietic and related tissues: Secondary | ICD-10-CM | POA: Diagnosis present

## 2013-05-26 DIAGNOSIS — Z8521 Personal history of malignant neoplasm of larynx: Secondary | ICD-10-CM

## 2013-05-26 DIAGNOSIS — M79641 Pain in right hand: Secondary | ICD-10-CM

## 2013-05-26 DIAGNOSIS — I639 Cerebral infarction, unspecified: Secondary | ICD-10-CM

## 2013-05-26 DIAGNOSIS — E785 Hyperlipidemia, unspecified: Secondary | ICD-10-CM

## 2013-05-26 DIAGNOSIS — R4182 Altered mental status, unspecified: Secondary | ICD-10-CM

## 2013-05-26 DIAGNOSIS — L659 Nonscarring hair loss, unspecified: Secondary | ICD-10-CM

## 2013-05-26 DIAGNOSIS — C8315 Mantle cell lymphoma, lymph nodes of inguinal region and lower limb: Secondary | ICD-10-CM | POA: Diagnosis present

## 2013-05-26 DIAGNOSIS — F3289 Other specified depressive episodes: Secondary | ICD-10-CM

## 2013-05-26 DIAGNOSIS — I480 Paroxysmal atrial fibrillation: Secondary | ICD-10-CM | POA: Diagnosis present

## 2013-05-26 DIAGNOSIS — Z79899 Other long term (current) drug therapy: Secondary | ICD-10-CM

## 2013-05-26 DIAGNOSIS — Z96659 Presence of unspecified artificial knee joint: Secondary | ICD-10-CM

## 2013-05-26 DIAGNOSIS — H538 Other visual disturbances: Secondary | ICD-10-CM | POA: Diagnosis present

## 2013-05-26 DIAGNOSIS — M1990 Unspecified osteoarthritis, unspecified site: Secondary | ICD-10-CM

## 2013-05-26 DIAGNOSIS — F411 Generalized anxiety disorder: Secondary | ICD-10-CM

## 2013-05-26 DIAGNOSIS — K219 Gastro-esophageal reflux disease without esophagitis: Secondary | ICD-10-CM

## 2013-05-26 DIAGNOSIS — R4789 Other speech disturbances: Secondary | ICD-10-CM | POA: Diagnosis present

## 2013-05-26 DIAGNOSIS — M7501 Adhesive capsulitis of right shoulder: Secondary | ICD-10-CM

## 2013-05-26 DIAGNOSIS — K625 Hemorrhage of anus and rectum: Secondary | ICD-10-CM

## 2013-05-26 DIAGNOSIS — R1319 Other dysphagia: Secondary | ICD-10-CM

## 2013-05-26 DIAGNOSIS — Z8673 Personal history of transient ischemic attack (TIA), and cerebral infarction without residual deficits: Secondary | ICD-10-CM

## 2013-05-26 DIAGNOSIS — R0602 Shortness of breath: Secondary | ICD-10-CM

## 2013-05-26 DIAGNOSIS — Z8572 Personal history of non-Hodgkin lymphomas: Secondary | ICD-10-CM | POA: Diagnosis present

## 2013-05-26 DIAGNOSIS — J449 Chronic obstructive pulmonary disease, unspecified: Secondary | ICD-10-CM | POA: Diagnosis present

## 2013-05-26 DIAGNOSIS — K632 Fistula of intestine: Secondary | ICD-10-CM

## 2013-05-26 DIAGNOSIS — C8510 Unspecified B-cell lymphoma, unspecified site: Secondary | ICD-10-CM

## 2013-05-26 DIAGNOSIS — M1710 Unilateral primary osteoarthritis, unspecified knee: Secondary | ICD-10-CM

## 2013-05-26 DIAGNOSIS — N289 Disorder of kidney and ureter, unspecified: Secondary | ICD-10-CM | POA: Diagnosis present

## 2013-05-26 DIAGNOSIS — I4891 Unspecified atrial fibrillation: Secondary | ICD-10-CM

## 2013-05-26 DIAGNOSIS — K432 Incisional hernia without obstruction or gangrene: Secondary | ICD-10-CM

## 2013-05-26 DIAGNOSIS — K279 Peptic ulcer, site unspecified, unspecified as acute or chronic, without hemorrhage or perforation: Secondary | ICD-10-CM

## 2013-05-26 DIAGNOSIS — F329 Major depressive disorder, single episode, unspecified: Secondary | ICD-10-CM

## 2013-05-26 DIAGNOSIS — D649 Anemia, unspecified: Secondary | ICD-10-CM

## 2013-05-26 DIAGNOSIS — Z8711 Personal history of peptic ulcer disease: Secondary | ICD-10-CM | POA: Diagnosis present

## 2013-05-26 DIAGNOSIS — Z823 Family history of stroke: Secondary | ICD-10-CM

## 2013-05-26 DIAGNOSIS — R079 Chest pain, unspecified: Secondary | ICD-10-CM

## 2013-05-26 HISTORY — DX: Acute embolism and thrombosis of unspecified deep veins of unspecified lower extremity: I82.409

## 2013-05-26 HISTORY — DX: Cerebral infarction, unspecified: I63.9

## 2013-05-26 HISTORY — DX: Personal history of other diseases of the digestive system: Z87.19

## 2013-05-26 HISTORY — DX: Shortness of breath: R06.02

## 2013-05-26 HISTORY — DX: Essential (primary) hypertension: I10

## 2013-05-26 HISTORY — DX: Atherosclerotic heart disease of native coronary artery without angina pectoris: I25.10

## 2013-05-26 HISTORY — DX: Pneumonia, unspecified organism: J18.9

## 2013-05-26 HISTORY — DX: Family history of other specified conditions: Z84.89

## 2013-05-26 HISTORY — DX: Cardiac murmur, unspecified: R01.1

## 2013-05-26 LAB — POCT I-STAT, CHEM 8
Chloride: 103 mEq/L (ref 96–112)
HCT: 44 % (ref 36.0–46.0)
Hemoglobin: 15 g/dL (ref 12.0–15.0)
Potassium: 4.1 mEq/L (ref 3.5–5.1)
Sodium: 141 mEq/L (ref 135–145)

## 2013-05-26 LAB — COMPREHENSIVE METABOLIC PANEL
AST: 34 U/L (ref 0–37)
Albumin: 4.1 g/dL (ref 3.5–5.2)
BUN: 16 mg/dL (ref 6–23)
CO2: 26 mEq/L (ref 19–32)
Calcium: 10 mg/dL (ref 8.4–10.5)
Chloride: 102 mEq/L (ref 96–112)
Creatinine, Ser: 1.1 mg/dL (ref 0.50–1.10)
GFR calc Af Amer: 52 mL/min — ABNORMAL LOW (ref >90)
GFR calc non Af Amer: 45 mL/min — ABNORMAL LOW (ref >90)
Glucose, Bld: 103 mg/dL — ABNORMAL HIGH (ref 70–99)
Total Bilirubin: 0.5 mg/dL (ref 0.3–1.2)

## 2013-05-26 LAB — TROPONIN I: Troponin I: <0.3 ng/mL (ref <0.30)

## 2013-05-26 LAB — APTT: aPTT: 59 seconds — ABNORMAL HIGH (ref 24–37)

## 2013-05-26 LAB — RAPID URINE DRUG SCREEN, HOSP PERFORMED
Amphetamines: NOT DETECTED
Barbiturates: NOT DETECTED
Benzodiazepines: NOT DETECTED
Opiates: NOT DETECTED
Tetrahydrocannabinol: NOT DETECTED

## 2013-05-26 LAB — URINALYSIS, ROUTINE W REFLEX MICROSCOPIC
Hgb urine dipstick: NEGATIVE
Ketones, ur: NEGATIVE mg/dL
Nitrite: NEGATIVE
Specific Gravity, Urine: 1.014 (ref 1.005–1.030)
Urobilinogen, UA: 0.2 mg/dL (ref 0.0–1.0)
pH: 6.5 (ref 5.0–8.0)

## 2013-05-26 LAB — CBC
HCT: 41.5 % (ref 36.0–46.0)
Hemoglobin: 13.4 g/dL (ref 12.0–15.0)
MCV: 89.1 fL (ref 78.0–100.0)
RBC: 4.66 MIL/uL (ref 3.87–5.11)
RDW: 14.2 % (ref 11.5–15.5)
WBC: 5.3 10*3/uL (ref 4.0–10.5)

## 2013-05-26 LAB — DIFFERENTIAL
Basophils Absolute: 0 10*3/uL (ref 0.0–0.1)
Eosinophils Relative: 4 % (ref 0–5)
Lymphocytes Relative: 29 % (ref 12–46)
Lymphs Abs: 1.6 10*3/uL (ref 0.7–4.0)
Monocytes Absolute: 0.6 10*3/uL (ref 0.1–1.0)
Monocytes Relative: 11 % (ref 3–12)
Neutro Abs: 2.9 10*3/uL (ref 1.7–7.7)

## 2013-05-26 LAB — URINE MICROSCOPIC-ADD ON

## 2013-05-26 LAB — ETHANOL: Alcohol, Ethyl (B): <11 mg/dL (ref 0–11)

## 2013-05-26 LAB — POCT I-STAT TROPONIN I

## 2013-05-26 LAB — PROTIME-INR: Prothrombin Time: 17.1 seconds — ABNORMAL HIGH (ref 11.6–15.2)

## 2013-05-26 MED ORDER — DABIGATRAN ETEXILATE MESYLATE 150 MG PO CAPS
150.0000 mg | ORAL_CAPSULE | Freq: Two times a day (BID) | ORAL | Status: DC
  Administered 2013-05-26 – 2013-05-27 (×3): 150 mg via ORAL
  Filled 2013-05-26 (×5): qty 1

## 2013-05-26 MED ORDER — ADULT MULTIVITAMIN W/MINERALS CH
1.0000 | ORAL_TABLET | Freq: Every day | ORAL | Status: DC
  Administered 2013-05-26 – 2013-05-29 (×4): 1 via ORAL
  Filled 2013-05-26 (×4): qty 1

## 2013-05-26 MED ORDER — BISOPROLOL FUMARATE 5 MG PO TABS
5.0000 mg | ORAL_TABLET | Freq: Every day | ORAL | Status: DC
  Administered 2013-05-27 – 2013-05-28 (×2): 5 mg via ORAL
  Filled 2013-05-26 (×3): qty 1

## 2013-05-26 MED ORDER — IOHEXOL 350 MG/ML SOLN
80.0000 mL | Freq: Once | INTRAVENOUS | Status: AC | PRN
  Administered 2013-05-26: 80 mL via INTRAVENOUS

## 2013-05-26 MED ORDER — CEFUROXIME AXETIL 500 MG PO TABS
500.0000 mg | ORAL_TABLET | Freq: Two times a day (BID) | ORAL | Status: DC
  Administered 2013-05-27 – 2013-05-29 (×5): 500 mg via ORAL
  Filled 2013-05-26 (×7): qty 1

## 2013-05-26 NOTE — ED Provider Notes (Signed)
CSN: [id number]     Arrival date & time 05/26/13  1725 History   First MD Initiated Contact with Patient 05/26/13 1741     Chief Complaint  Patient presents with  . Code Stroke  . Aphasia   (Consider location/radiation/quality/duration/timing/severity/associated sxs/prior Treatment) HPI Comments: Patient seen and worked-up for aphasia at [facility], transferred to [facility] for evaluation by neurology.  Patient reports onset of dysarthria while speaking with her daughter on the phone around 1630 today.  Recent ED visit on 05/24/13 for TIA symptoms, but did not want to be admitted at that time.  Symptoms have improve, patient with minimal difficulty finding her words at present.  Symmetric smile, no arm drift, equal strength bilaterally upper and lower extremities.  Patient is a 79 y.o. female presenting with Acute Neurological Problem. The history is provided by the patient and medical records. No language interpreter was used.  Cerebrovascular Accident This is a recurrent problem. The current episode started today. The problem has been rapidly improving. Pertinent negatives include no abdominal pain, chest pain, chills, congestion, coughing, fever, headaches, neck pain, rash, vertigo, visual change or vomiting.    Past Medical History  Diagnosis Date  . GERD (gastroesophageal reflux disease)   . Arthritis   . Stroke 10/2002    no residual  . History of TIAs   . Diverticulosis   . PUD (peptic ulcer disease)   . PVD (peripheral vascular disease)   . Hyperlipidemia   . Anxiety   . Depression   . 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 Blood transfusion   . COPD (chronic obstructive pulmonary disease)   . Lymphona, mantle cell, inguinal region/lower limb    Past Surgical History  Procedure Laterality Date  . Appendectomy    . Colon surgery  09/13/2008    iliostomy for diverticulitis  . Hernia repair    . Tubal ligation    . Throat surgery   1986  . Total knee arthroplasty      2002 left 2008 right  . Reversal of iliostomy  01/2010  . Cataract extraction  2005    bilateral  . Carotid surgery      2004 a month apart right and left   Family History  Problem Relation Age of Onset  . Colon cancer Sister     colon  . Hyperlipidemia Mother   . Stroke Mother   . Cancer Father     mesothelioma   History  Substance Use Topics  . Smoking status: Former Games developer  . Smokeless tobacco: Never Used  . Alcohol Use: No   OB History   Grav Para Term Preterm Abortions TAB SAB Ect Mult Living                 Review of Systems  Constitutional: Negative for fever and chills.  HENT: Negative for congestion.   Eyes: Negative for visual disturbance.  Respiratory: Negative for cough and shortness of breath.   Cardiovascular: Negative for chest pain.  Gastrointestinal: Negative for vomiting and abdominal pain.  Genitourinary: Negative for dysuria and flank pain.  Musculoskeletal: Negative for back pain, neck pain and neck stiffness.  Skin: Negative for rash.  Neurological: Positive for speech difficulty. Negative for vertigo, light-headedness and headaches.    Allergies  Propoxyphene hcl and Propoxyphene-acetaminophen  Home Medications   Current Outpatient Rx  Name  Route  Sig  Dispense  Refill  . ALPRAZolam (XANAX) 0.5 MG tablet  Oral   Take 1 tablet (0.5 mg total) by mouth at bedtime as needed for sleep.   30 tablet   3   . bisoprolol (ZEBETA) 5 MG tablet   Oral   Take 5 mg by mouth daily at 12 noon.          . cefUROXime (CEFTIN) 500 MG tablet   Oral   Take 1 tablet (500 mg total) by mouth 2 (two) times daily.   14 tablet   0   . ciprofloxacin (CIPRO) 500 MG tablet   Oral   Take 1 tablet (500 mg total) by mouth 2 (two) times daily.   14 tablet   0   . clotrimazole-betamethasone (LOTRISONE) lotion      APPLY TOPICALLY TWICE DAILY.   30 mL   3   . Cyanocobalamin (VITAMIN B-12 PO)   Oral   Take 1  tablet by mouth daily.         . dabigatran (PRADAXA) 150 MG CAPS   Oral   Take 1 capsule (150 mg total) by mouth every 12 (twelve) hours.   60 capsule   3   . diclofenac sodium (VOLTAREN) 1 % GEL   Topical   Apply 2 g topically 4 (four) times daily.   100 g   11   . Multiple Vitamin (MULTIVITAMIN WITH MINERALS) TABS tablet   Oral   Take 1 tablet by mouth daily.         . Vitamin D, Ergocalciferol, (DRISDOL) 50000 UNITS CAPS   Oral   Take 50,000 Units by mouth every Sunday.          BP 166/127  Pulse 92  Temp(Src) 97.3 F (36.3 C) (Oral)  Resp 20  SpO2 96% Physical Exam  Nursing note and vitals reviewed. Constitutional: She is oriented to person, place, and time. She appears well-developed and well-nourished.  HENT:  Head: Normocephalic and atraumatic.  Eyes: Conjunctivae are normal. Pupils are equal, round, and reactive to light. Right eye exhibits no discharge. Left eye exhibits no discharge.  Neck: Normal range of motion. Neck supple. No tracheal deviation present.  Cardiovascular: Normal rate and regular rhythm.   Pulmonary/Chest: Effort normal and breath sounds normal.  Abdominal: Soft. Bowel sounds are normal. She exhibits no distension. There is no tenderness. There is no guarding.  Musculoskeletal: She exhibits no edema and no tenderness.  Neurological: She is alert and oriented to person, place, and time. No cranial nerve deficit or sensory deficit. GCS eye subscore is 4. GCS verbal subscore is 5. GCS motor subscore is 6.  Skin: Skin is warm and dry. No rash noted.  Psychiatric: She has a normal mood and affect.    ED Course  Procedures (including critical care time) Labs Review Labs Reviewed  PROTIME-INR - Abnormal; Notable for the following:    Prothrombin Time 17.1 (*)    All other components within normal limits  APTT - Abnormal; Notable for the following:    aPTT 59 (*)    All other components within normal limits  POCT I-STAT, CHEM 8 -  Abnormal; Notable for the following:    Creatinine, Ser 1.40 (*)    All other components within normal limits  GLUCOSE, CAPILLARY  CBC  DIFFERENTIAL  TROPONIN I  URINE RAPID DRUG SCREEN (HOSP PERFORMED)  ETHANOL  COMPREHENSIVE METABOLIC PANEL  URINALYSIS, ROUTINE W REFLEX MICROSCOPIC  POCT I-STAT TROPONIN I   Imaging Review Ct Head Wo Contrast  05/26/2013   CLINICAL DATA:  A CHF began at 1630 hr today, history of stroke  EXAM: CT HEAD WITHOUT CONTRAST  TECHNIQUE: Contiguous axial images were obtained from the base of the skull through the vertex without intravenous contrast.  COMPARISON:  05/24/2013  FINDINGS: There is no hemorrhage or extra-axial fluid. There is no evidence of mass or hydrocephalus. There is mild to moderate atrophy and there is moderate diffuse bilateral deep white matter low attenuation. Lacunar infarct left basal ganglia measuring about 1 cm, stable. No acute vascular territory infarct. Calvarium intact.  IMPRESSION: No acute findings. Stable involutional change and left lacunar infarct.   Electronically Signed   By: Esperanza Heir M.D.   On: 05/26/2013 18:05    EKG Interpretation     Ventricular Rate:  80 PR Interval:  185 QRS Duration: 89 QT Interval:  369 QTC Calculation: 426 R Axis:   31 Text Interpretation:  Sinus rhythm ECG OTHERWISE WITHIN NORMAL LIMITS           6:49 PM Patient has been seen and evaluated by Dr. Thad Ranger with neurology.  Plan is to obtain admit to hospitalist service MDM   1. Aphasia        Jimmye Norman, NP 05/27/13 0119 Medical screening examination/treatment/procedure(s) were conducted as a shared visit with non-physician practitioner(s) or resident and myself. I personally evaluated the patient during the encounter and agree with the findings and plan unless otherwise indicated.  I have personally reviewed any xrays and/ or EKG's with the provider and I agree with interpretation.  Intermittent stroke sxs with  word finding difficulties. Pt feels almost at baseline in ED. 5+ strength in UE and LE with f/e at major joints.  Sensation to palpation intact in UE and LE.  CNs 2-12 grossly intact. EOMFI. PERRL.  Finger nose and coordination intact bilateral.  Visual fields intact to finger testing.  Neuro evaluated. Plan for admission for TIA vs Stroke.   TIA   Enid Skeens, MD 05/28/13 [phone]

## 2013-05-26 NOTE — ED Notes (Signed)
Pt back from CT

## 2013-05-26 NOTE — ED Notes (Signed)
Charge at [facility] ED alerted

## 2013-05-26 NOTE — ED Provider Notes (Signed)
TIME SEEN: 5:30 PM  CHIEF COMPLAINT: aphasia  HPI: Patient is an 79 year old female with a history of prior stroke in 2004 that caused right-sided upper extremity numbness with no current residual deficits he did not receive TPA currently on Pradaxa, hyperlipidemia, with recent UTI being treated by Ceftin presents emergency department with acute onset of aphasia while talking to her daughter on the phone today at 4:30 PM. She reports that she was seen in the emergency department 10/22 for the same. They were concerned at that time for a TIA and recommended admission but patient wanted discharge home. She followed her primary care physician today. She states she was feeling well until this afternoon. She feels that her akathisia is improving. No numbness, tingling or focal weakness. No fusion or hearing changes. No recent head injury that she has had a mild diffuse headache. No chest pain or difficulty breathing. No palpitations.  ROS: See HPI Constitutional: no fever  Eyes: no drainage  ENT: no runny nose   Cardiovascular:  no chest pain  Resp: no SOB  GI: no vomiting GU: no dysuria Integumentary: no rash  Allergy: no hives  Musculoskeletal: no leg swelling  Neurological: no slurred speech ROS otherwise negative  PAST MEDICAL HISTORY/PAST SURGICAL HISTORY:  Past Medical History  Diagnosis Date  . GERD (gastroesophageal reflux disease)   . Arthritis   . Stroke 10/2002    no residual  . History of TIAs   . Diverticulosis   . PUD (peptic ulcer disease)   . PVD (peripheral vascular disease)   . Hyperlipidemia   . Anxiety   . Depression   . 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 Blood transfusion   . COPD (chronic obstructive pulmonary disease)   . Lymphona, mantle cell, inguinal region/lower limb     MEDICATIONS:  Prior to Admission medications   Medication Sig Start Date End Date Taking? Authorizing Provider  ALPRAZolam Prudy Feeler)  0.5 MG tablet Take 1 tablet (0.5 mg total) by mouth at bedtime as needed for sleep. 05/15/13   Stacie Glaze, MD  bisoprolol (ZEBETA) 5 MG tablet Take 5 mg by mouth daily at 12 noon.     Historical Provider, MD  cefUROXime (CEFTIN) 500 MG tablet Take 1 tablet (500 mg total) by mouth 2 (two) times daily. 05/24/13   Stacie Glaze, MD  ciprofloxacin (CIPRO) 500 MG tablet Take 1 tablet (500 mg total) by mouth 2 (two) times daily. 05/19/13   Kristian Covey, MD  clotrimazole-betamethasone (LOTRISONE) lotion APPLY TOPICALLY TWICE DAILY. 03/21/13   Stacie Glaze, MD  Cyanocobalamin (VITAMIN B-12 PO) Take 1 tablet by mouth daily.    Historical Provider, MD  dabigatran (PRADAXA) 150 MG CAPS Take 1 capsule (150 mg total) by mouth every 12 (twelve) hours. 02/27/13   Stacie Glaze, MD  diclofenac sodium (VOLTAREN) 1 % GEL Apply 2 g topically 4 (four) times daily. 01/09/13   Stacie Glaze, MD  Multiple Vitamin (MULTIVITAMIN WITH MINERALS) TABS tablet Take 1 tablet by mouth daily.    Historical Provider, MD  Vitamin D, Ergocalciferol, (DRISDOL) 50000 UNITS CAPS Take 50,000 Units by mouth every Sunday.    Historical Provider, MD    ALLERGIES:  Allergies  Allergen Reactions  . Propoxyphene Hcl   . Propoxyphene-Acetaminophen     REACTION: Reaction not known    SOCIAL HISTORY:  History  Substance Use Topics  . Smoking status: Former Games developer  .  Smokeless tobacco: Never Used  . Alcohol Use: No    FAMILY HISTORY: Family History  Problem Relation Age of Onset  . Colon cancer Sister     colon  . Hyperlipidemia Mother   . Stroke Mother   . Cancer Father     mesothelioma    EXAM: BP 173/104  Temp(Src) 97.3 F (36.3 C) (Oral)  Resp 18  SpO2 96% CONSTITUTIONAL: Alert and oriented x 3 and responds appropriately to questions. Well-appearing; well-nourished   HEAD: Normocephalic EYES: Conjunctivae clear, PERRL ENT: normal nose; no rhinorrhea; moist mucous membranes; pharynx without lesions  noted NECK: Supple, no meningismus, no LAD  CARD: RRR; S1 and S2 appreciated; no murmurs, no clicks, no rubs, no gallops RESP: Normal chest excursion without splinting or tachypnea; breath sounds clear and equal bilaterally; no wheezes, no rhonchi, no rales,  ABD/GI: Normal bowel sounds; non-distended; soft, non-tender, no rebound, no guarding BACK:  The back appears normal and is non-tender to palpation, there is no CVA tenderness EXT: Normal ROM in all joints; non-tender to palpation; no edema; normal capillary refill; no cyanosis    SKIN: Normal color for age and race; warm NEURO: Moves all extremities equally, strength 5/5 in all 4 extremities, sensation to light touch intact diffusely, normal visual fields, no dysmetria to finger to nose testing, no pronator drift, NIH stroke scale is 1; mild expressive aphasia but no dysarthria PSYCH: The patient's mood and manner are appropriate. Grooming and personal hygiene are appropriate.  MEDICAL DECISION MAKING: Patient with mild expressive aphasia that started at 4:30 PM.  She reports her symptoms are improving. Her stroke scale is 1. Discussed with Dr. Thad Ranger, neurologist, who will followup on patient's imaging. Given she is improving and her aphasia is very mild, I do not feel she is a TPA candidate at this time.  Will obtain labs, urine, head CT.  ED PROGRESS: Patient continuing to improve. Labs relatively unremarkable. Head CT shows no acute findings, stable left lacunar infarct. Discussed with Dr. Thad Ranger, neurology, who would like patient transferred to [facility].  Will not give TPA at this time as patient is almost back to baseline. Discussed with patient and family who agree with this plan.     EKG Interpretation     Ventricular Rate:  80 PR Interval:  185 QRS Duration: 89 QT Interval:  369 QTC Calculation: 426 R Axis:   31 Text Interpretation:  Sinus rhythm ECG OTHERWISE WITHIN NORMAL LIMITS           Results for orders  placed during the hospital encounter of 05/26/13  GLUCOSE, CAPILLARY      Result Value Range   Glucose-Capillary 93  70 - 99 mg/dL  PROTIME-INR      Result Value Range   Prothrombin Time 17.1 (*) 11.6 - 15.2 seconds   INR 1.43  0.00 - 1.49  APTT      Result Value Range   aPTT 59 (*) 24 - 37 seconds  CBC      Result Value Range   WBC 5.3  4.0 - 10.5 K/uL   RBC 4.66  3.87 - 5.11 MIL/uL   Hemoglobin 13.4  12.0 - 15.0 g/dL   HCT 16.1  09.6 - 04.5 %   MCV 89.1  78.0 - 100.0 fL   MCH 28.8  26.0 - 34.0 pg   MCHC 32.3  30.0 - 36.0 g/dL   RDW 40.9  81.1 - 91.4 %   Platelets 205  150 - 400  K/uL  DIFFERENTIAL      Result Value Range   Neutrophils Relative % 55  43 - 77 %   Neutro Abs 2.9  1.7 - 7.7 K/uL   Lymphocytes Relative 29  12 - 46 %   Lymphs Abs 1.6  0.7 - 4.0 K/uL   Monocytes Relative 11  3 - 12 %   Monocytes Absolute 0.6  0.1 - 1.0 K/uL   Eosinophils Relative 4  0 - 5 %   Eosinophils Absolute 0.2  0.0 - 0.7 K/uL   Basophils Relative 1  0 - 1 %   Basophils Absolute 0.0  0.0 - 0.1 K/uL  POCT I-STAT, CHEM 8      Result Value Range   Sodium 141  135 - 145 mEq/L   Potassium 4.1  3.5 - 5.1 mEq/L   Chloride 103  96 - 112 mEq/L   BUN 18  6 - 23 mg/dL   Creatinine, Ser 4.78 (*) 0.50 - 1.10 mg/dL   Glucose, Bld 99  70 - 99 mg/dL   Calcium, Ion 2.95  6.21 - 1.30 mmol/L   TCO2 25  0 - 100 mmol/L   Hemoglobin 15.0  12.0 - 15.0 g/dL   HCT 30.8  65.7 - 84.6 %  POCT I-STAT TROPONIN I      Result Value Range   Troponin i, poc 0.02  0.00 - 0.08 ng/mL   Comment 3            Ct Head Wo Contrast  05/26/2013   CLINICAL DATA:  A CHF began at 1630 hr today, history of stroke  EXAM: CT HEAD WITHOUT CONTRAST  TECHNIQUE: Contiguous axial images were obtained from the base of the skull through the vertex without intravenous contrast.  COMPARISON:  05/24/2013  FINDINGS: There is no hemorrhage or extra-axial fluid. There is no evidence of mass or hydrocephalus. There is mild to moderate atrophy  and there is moderate diffuse bilateral deep white matter low attenuation. Lacunar infarct left basal ganglia measuring about 1 cm, stable. No acute vascular territory infarct. Calvarium intact.  IMPRESSION: No acute findings. Stable involutional change and left lacunar infarct.   Electronically Signed   By: Esperanza Heir M.D.   On: 05/26/2013 18:05       Layla Maw Baily Serpe, DO 05/26/13 1821

## 2013-05-26 NOTE — ED Notes (Signed)
Family states aphasia began at 49. Hx of stroke. MD at bedside. No other symptoms noted.

## 2013-05-26 NOTE — Consult Note (Signed)
NEURO HOSPITALIST CONSULT NOTE    Reason for Consult:ED [facility]  HPI:                                                                                                                                          [name] is an 79 y.o. female with a past medical history significant for HTN, hyperlipidemia, TIA, stroke without residual deficits, s/p bilateral CEA several years ago, atrial fibrillation currently on Pradaxa, PVD, PUD, depression/anxiety, initially seen at [facility] ED and then transferred to [facility] ED for further evaluation of acute dysphasia. She was reportedly seen in the ED 2 days ago due to difficulty speaking but got better and decided not to stay in the hospital for further testing. Today, around 430 pm, she was talking to her daughter on the phone when suddenly realized that she was having difficulty expressing herself although remarks that she was able to comprehend well and was not really confused. She was told by her daughter that she was " getting words out ok but in the wrong order". Episode lasted at least 2 hours. Denies associated HA, vertigo, double vision, difficulty swallowing, imbalance, focal weakness-numbness, or visual impairment. Her language rapidly improved and thus was not considered a proper candidate for thrombolysis. CT/CTA brain and neck are unimpressive. Presently, she feels back to her baseline.       Past Medical History  Diagnosis Date  . GERD (gastroesophageal reflux disease)   . Arthritis   . Stroke 10/2002    no residual  . History of TIAs   . Diverticulosis   . PUD (peptic ulcer disease)   . PVD (peripheral vascular disease)   . Hyperlipidemia   . Anxiety   . Depression   . 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 Blood transfusion   . COPD (chronic obstructive pulmonary disease)   . Lymphona, mantle cell, inguinal region/lower limb   . Hypertension     Past Surgical  History  Procedure Laterality Date  . Appendectomy    . Colon surgery  09/13/2008    iliostomy for diverticulitis  . Hernia repair    . Tubal ligation    . Throat surgery  1986  . Total knee arthroplasty      2002 left 2008 right  . Reversal of iliostomy  01/2010  . Cataract extraction  2005    bilateral  . Carotid surgery      2004 a month apart right and left    Family History  Problem Relation Age of Onset  . Colon cancer Sister     colon  . Hyperlipidemia Mother   . Stroke Mother   . Cancer Father     mesothelioma  Social History:  reports that she has quit smoking. She has never used smokeless tobacco. She reports that she does not drink alcohol or use illicit drugs.  Allergies  Allergen Reactions  . Propoxyphene Hcl   . Propoxyphene-Acetaminophen     REACTION: Reaction not known    MEDICATIONS:                                                                                                                     I have reviewed the patient's current medications.   ROS:                                                                                                                                       History obtained from the patient and chart review.  General ROS: negative for - chills, fatigue, fever, night sweats, weight gain or weight loss Psychological ROS: negative for - behavioral disorder, hallucinations, memory difficulties, mood swings or suicidal ideation Ophthalmic ROS: negative for - blurry vision, double vision, eye pain or loss of vision ENT ROS: negative for - epistaxis, nasal discharge, oral lesions, sore throat, tinnitus or vertigo Allergy and Immunology ROS: negative for - hives or itchy/watery eyes Hematological and Lymphatic ROS: negative for - bleeding problems, bruising or swollen lymph nodes Endocrine ROS: negative for - galactorrhea, hair pattern changes, polydipsia/polyuria or temperature intolerance Respiratory ROS: negative for - cough,  hemoptysis, shortness of breath or wheezing Cardiovascular ROS: negative for - chest pain, dyspnea on exertion, edema or irregular heartbeat Gastrointestinal ROS: negative for - abdominal pain, diarrhea, hematemesis, nausea/vomiting or stool incontinence Genito-Urinary ROS: negative for - dysuria, hematuria, incontinence or urinary frequency/urgency Musculoskeletal ROS: negative for - joint swelling or muscular weakness Neurological ROS: as noted in HPI Dermatological ROS: negative for rash and skin lesion changes    Physical exam: pleasant female in no apparent distress. Blood pressure 143/87, pulse 79, temperature 98 F (36.7 C), temperature source Oral, resp. rate 20, SpO2 96.00%. Head: normocephalic. Neck: supple, no bruits, no JVD. Cardiac: no murmurs. Lungs: clear. Abdomen: soft, no tender, no mass. Extremities: no edema.  Neurologic Examination:  Mental Status: Alert, awake, oriented x 4, thought content appropriate.  Speech fluent without evidence of aphasia.  Able to follow 3 step commands without difficulty. Cranial Nerves: II: Discs flat bilaterally; Visual fields grossly normal, pupils equal, round, reactive to light and accommodation III,IV, VI: ptosis not present, extra-ocular motions intact bilaterally V,VII: smile symmetric, facial light touch sensation normal bilaterally VIII: hearing normal bilaterally IX,X: gag reflex present XI: bilateral shoulder shrug XII: midline tongue extension without atrophy or fasciculations  Motor: Right : Upper extremity   5/5    Left:     Upper extremity   5/5  Lower extremity   5/5     Lower extremity   5/5 Tone and bulk:normal tone throughout; no atrophy noted Sensory: Pinprick and light touch intact throughout, bilaterally Deep Tendon Reflexes:  1+ all over Plantars: Right: downgoing   Left: downgoing Cerebellar: normal  finger-to-nose,  normal heel-to-shin test Gait:  No tested CV: pulses palpable throughout    Lab Results  Component Value Date/Time   CHOL 176 02/13/2010  8:38 AM    Results for orders placed during the hospital encounter of 05/26/13 (from the past 48 hour(s))  GLUCOSE, CAPILLARY     Status: None   Collection Time    05/26/13  5:35 PM      Result Value Range   Glucose-Capillary 93  70 - 99 mg/dL  ETHANOL     Status: None   Collection Time    05/26/13  5:50 PM      Result Value Range   Alcohol, Ethyl (B) <11  0 - 11 mg/dL   Comment:            LOWEST DETECTABLE LIMIT FOR     SERUM ALCOHOL IS 11 mg/dL     FOR MEDICAL PURPOSES ONLY  PROTIME-INR     Status: Abnormal   Collection Time    05/26/13  5:50 PM      Result Value Range   Prothrombin Time 17.1 (*) 11.6 - 15.2 seconds   INR 1.43  0.00 - 1.49  APTT     Status: Abnormal   Collection Time    05/26/13  5:50 PM      Result Value Range   aPTT 59 (*) 24 - 37 seconds   Comment:            IF BASELINE aPTT IS ELEVATED,     SUGGEST PATIENT RISK ASSESSMENT     BE USED TO DETERMINE APPROPRIATE     ANTICOAGULANT THERAPY.  CBC     Status: None   Collection Time    05/26/13  5:50 PM      Result Value Range   WBC 5.3  4.0 - 10.5 K/uL   RBC 4.66  3.87 - 5.11 MIL/uL   Hemoglobin 13.4  12.0 - 15.0 g/dL   HCT 96.0  45.4 - 09.8 %   MCV 89.1  78.0 - 100.0 fL   MCH 28.8  26.0 - 34.0 pg   MCHC 32.3  30.0 - 36.0 g/dL   RDW 11.9  14.7 - 82.9 %   Platelets 205  150 - 400 K/uL  DIFFERENTIAL     Status: None   Collection Time    05/26/13  5:50 PM      Result Value Range   Neutrophils Relative % 55  43 - 77 %   Neutro Abs 2.9  1.7 - 7.7 K/uL   Lymphocytes Relative 29  12 - 46 %  Lymphs Abs 1.6  0.7 - 4.0 K/uL   Monocytes Relative 11  3 - 12 %   Monocytes Absolute 0.6  0.1 - 1.0 K/uL   Eosinophils Relative 4  0 - 5 %   Eosinophils Absolute 0.2  0.0 - 0.7 K/uL   Basophils Relative 1  0 - 1 %   Basophils Absolute 0.0  0.0 - 0.1  K/uL  COMPREHENSIVE METABOLIC PANEL     Status: Abnormal   Collection Time    05/26/13  5:50 PM      Result Value Range   Sodium 139  135 - 145 mEq/L   Potassium 4.1  3.5 - 5.1 mEq/L   Chloride 102  96 - 112 mEq/L   CO2 26  19 - 32 mEq/L   Glucose, Bld 103 (*) 70 - 99 mg/dL   BUN 16  6 - 23 mg/dL   Creatinine, Ser 1.61  0.50 - 1.10 mg/dL   Calcium 09.6  8.4 - 04.5 mg/dL   Total Protein 7.5  6.0 - 8.3 g/dL   Albumin 4.1  3.5 - 5.2 g/dL   AST 34  0 - 37 U/L   ALT 32  0 - 35 U/L   Alkaline Phosphatase 79  39 - 117 U/L   Total Bilirubin 0.5  0.3 - 1.2 mg/dL   GFR calc non Af Amer 45 (*) >90 mL/min   GFR calc Af Amer 52 (*) >90 mL/min   Comment: (NOTE)     The eGFR has been calculated using the CKD EPI equation.     This calculation has not been validated in all clinical situations.     eGFR's persistently <90 mL/min signify possible Chronic Kidney     Disease.  TROPONIN I     Status: None   Collection Time    05/26/13  5:50 PM      Result Value Range   Troponin I <0.30  <0.30 ng/mL   Comment:            Due to the release kinetics of cTnI,     a negative result within the first hours     of the onset of symptoms does not rule out     myocardial infarction with certainty.     If myocardial infarction is still suspected,     repeat the test at appropriate intervals.  POCT I-STAT TROPONIN I     Status: None   Collection Time    05/26/13  5:57 PM      Result Value Range   Troponin i, poc 0.02  0.00 - 0.08 ng/mL   Comment 3            Comment: Due to the release kinetics of cTnI,     a negative result within the first hours     of the onset of symptoms does not rule out     myocardial infarction with certainty.     If myocardial infarction is still suspected,     repeat the test at appropriate intervals.  POCT I-STAT, CHEM 8     Status: Abnormal   Collection Time    05/26/13  5:58 PM      Result Value Range   Sodium 141  135 - 145 mEq/L   Potassium 4.1  3.5 - 5.1 mEq/L    Chloride 103  96 - 112 mEq/L   BUN 18  6 - 23 mg/dL   Creatinine, Ser 4.09 (*) 0.50 - 1.10  mg/dL   Glucose, Bld 99  70 - 99 mg/dL   Calcium, Ion 7.82  9.56 - 1.30 mmol/L   TCO2 25  0 - 100 mmol/L   Hemoglobin 15.0  12.0 - 15.0 g/dL   HCT 21.3  08.6 - 57.8 %  URINE RAPID DRUG SCREEN (HOSP PERFORMED)     Status: None   Collection Time    05/26/13  6:17 PM      Result Value Range   Opiates NONE DETECTED  NONE DETECTED   Cocaine NONE DETECTED  NONE DETECTED   Benzodiazepines NONE DETECTED  NONE DETECTED   Amphetamines NONE DETECTED  NONE DETECTED   Tetrahydrocannabinol NONE DETECTED  NONE DETECTED   Barbiturates NONE DETECTED  NONE DETECTED   Comment:            DRUG SCREEN FOR MEDICAL PURPOSES     ONLY.  IF CONFIRMATION IS NEEDED     FOR ANY PURPOSE, NOTIFY LAB     WITHIN 5 DAYS.                LOWEST DETECTABLE LIMITS     FOR URINE DRUG SCREEN     Drug Class       Cutoff (ng/mL)     Amphetamine      1000     Barbiturate      200     Benzodiazepine   200     Tricyclics       300     Opiates          300     Cocaine          300     THC              50  URINALYSIS, ROUTINE W REFLEX MICROSCOPIC     Status: Abnormal   Collection Time    05/26/13  6:17 PM      Result Value Range   Color, Urine YELLOW  YELLOW   APPearance CLEAR  CLEAR   Specific Gravity, Urine 1.014  1.005 - 1.030   pH 6.5  5.0 - 8.0   Glucose, UA NEGATIVE  NEGATIVE mg/dL   Hgb urine dipstick NEGATIVE  NEGATIVE   Bilirubin Urine NEGATIVE  NEGATIVE   Ketones, ur NEGATIVE  NEGATIVE mg/dL   Protein, ur NEGATIVE  NEGATIVE mg/dL   Urobilinogen, UA 0.2  0.0 - 1.0 mg/dL   Nitrite NEGATIVE  NEGATIVE   Leukocytes, UA TRACE (*) NEGATIVE  URINE MICROSCOPIC-ADD ON     Status: None   Collection Time    05/26/13  6:17 PM      Result Value Range   Squamous Epithelial / LPF RARE  RARE   WBC, UA 0-2  <3 WBC/hpf    Ct Angio Head W/cm &/or Wo Cm  05/26/2013   CLINICAL DATA:  Stroke symptoms.  Aphasia beginning at  4:30 p.m.  EXAM: CT ANGIOGRAPHY HEAD AND NECK  TECHNIQUE: Multidetector CT imaging of the head and neck was performed using the standard protocol during bolus administration of intravenous contrast. Multiplanar CT image reconstructions including MIPs were obtained to evaluate the vascular anatomy. Carotid stenosis measurements (when applicable) are obtained utilizing NASCET criteria, using the distal internal carotid diameter as the denominator.  CONTRAST:  80mL OMNIPAQUE IOHEXOL 350 MG/ML SOLN  COMPARISON:  CT head without contrast 05/26/2013.  FINDINGS: CTA HEAD FINDINGS  Atrophy and diffuse white matter disease the similar to the prior exams. No pathologic enhancement is  evident. The basal ganglia are intact. The source images demonstrate normal gray-white differentiation. Ventricles are of normal size. No significant extra-axial fluid collection is present.  Dense atherosclerotic calcifications are present within the cavernous carotid arteries bilaterally without significant stenoses. The ICA termini are within normal limits bilaterally. The A1 and M1 segments are within normal limits. The MCA bifurcations are unremarkable. There is some attenuation of MCA branch vessels bilaterally. The basilar artery is within normal limits. Both posterior cerebral arteries originate from the basilar tip. The PCA branch vessels are within normal limits.  The dural sinuses are patent. The right transverse sinus is dominant.  Review of the MIP images confirms the above findings.  CTA NECK FINDINGS  A standard 3 vessel arch configuration is present. Atherosclerotic calcifications are present in the aortic arch without significant stenosis of the great vessel origins.  The vertebral arteries both originate from the subclavian arteries. The left vertebral artery is the dominant vessel. There is no significant stenosis of the vertebral arteries in the neck. The right vertebral artery essentially terminates at the PICA ascending only  a very small branch to the vertebrobasilar junction. There is some ectasia of the left vertebral artery.  The right common carotid artery demonstrates some atherosclerotic irregularity distally without significant stenosis. The bifurcation is unremarkable. The cervical right ICA demonstrates a focal 50% stenosis in the mid cervical ICA. The distal ICA is unremarkable.  The left common carotid artery demonstrates more calcification distally without a significant stenosis. The bifurcation demonstrates no focal stenosis. The left cervical ICA is unremarkable otherwise.  Of note, there is significant medial deviation of the carotid arteries bilaterally focused at the level of the bifurcations.  And advanced spondylosis of the cervical spine is evident. There is chronic loss of disc height in kyphosis C4-5, C5-6, and C6-7. Degenerative grade 1/2 anterolisthesis at C3-4 measures 5 mm.  The lung apices demonstrate biapical scarring. Mild edema is suggested.  Review of the MIP images confirms the above findings.  IMPRESSION: CTA HEAD IMPRESSION  1. Atrophy and diffuse white matter disease without evidence for an acute infarct. 2. Atherosclerotic changes of the cavernous carotid arteries without significant stenoses.  CTA NECK IMPRESSION  1. Atherosclerotic irregularity within the distal common carotid arteries bilaterally with medial deviation. 2. Focal 50% stenosis of the mid right cervical ICA without other significant stenoses. 3. Mild ectasia of the dominant left vertebral artery.   Electronically Signed   By: Gennette Pac M.D.   On: 05/26/2013 20:12   Ct Head Wo Contrast  05/26/2013   CLINICAL DATA:  A CHF began at 1630 hr today, history of stroke  EXAM: CT HEAD WITHOUT CONTRAST  TECHNIQUE: Contiguous axial images were obtained from the base of the skull through the vertex without intravenous contrast.  COMPARISON:  05/24/2013  FINDINGS: There is no hemorrhage or extra-axial fluid. There is no evidence of mass or  hydrocephalus. There is mild to moderate atrophy and there is moderate diffuse bilateral deep white matter low attenuation. Lacunar infarct left basal ganglia measuring about 1 cm, stable. No acute vascular territory infarct. Calvarium intact.  IMPRESSION: No acute findings. Stable involutional change and left lacunar infarct.   Electronically Signed   By: Esperanza Heir M.D.   On: 05/26/2013 18:05     Assessment/Plan: 79 y/o with multiple risk factors for stroke including atrial fibrillation on Pradaxa, admitted after sustaining a recurrent episode of expressive dysphasia within the last 48 hours. Non focal neuro-exam at this moment, and  unremarkable CT/CTA brain and neck. Probable TIA involving anterior cortical circulation left brain. ABCD2 score:4 and thus hospital observation warranted. Complete stroke work up. Continue Pradaxa. Will follow up.   Wyatt Portela, MD [facility] [phone]  05/26/2013, 9:00 PM

## 2013-05-26 NOTE — ED Notes (Signed)
EMS arrived

## 2013-05-26 NOTE — Patient Instructions (Signed)
Aphasia  Aphasia is a neurological disorder caused by damage to the parts of the brain that control language.  CAUSES   Aphasia is not a disease, but a symptom of brain damage. Aphasia is commonly seen in adults who have suffered a stroke. Aphasia also can result from:  · A brain tumor.  · Infection.  · Head injury.  · A rare type of dementia called Primary Progressive Aphasia. Common types of dementia may be associated with aphasia but can also exist without language problems.  SYMPTOMS   Primary signs of the disorder include:  · Problems expressing oneself when speaking.  · Trouble understanding speech.  · Difficulty with reading and writing.  · Speaking in short or incomplete sentences.  · Speaking in sentences that don't make sense.  · Speaking unrecognizable words.  · Interpreting figurative language literally.  · Writing sentences that don't make sense.  The type and severity of language problems depend on the precise location and extent of the damaged brain tissue.  Aphasia can be divided into four broad categories:  · Expressive aphasia - difficulty in conveying thoughts through speech or writing. The patient knows what they want to say, but cannot find the words they need.  · Receptive aphasia - difficulty understanding spoken or written language. The patient hears the voice or sees the print but cannot make sense of the words.  · Anomic or amnesia aphasia - difficulty in using the correct names for particular objects, people, places, or events. This is the least severe form of aphasia.  · Global aphasia results from severe and extensive damage to the language areas of the brain. Patients lose almost all language function, both comprehension (understanding) and expression. They cannot speak, understand speech, read, or write.  TREATMENT   Sometimes an individual will completely recover from aphasia without treatment. In most cases, language therapy should begin as soon as possible. Language therapy should  be tailored to the individual needs of the patient. Therapy with a speech pathologist involves exercises in which patients:  · Read.  · Write.  · Follow directions.  · Repeat what they hear.  · Computer-aided therapy may also be used.  PROGNOSIS   The outcome of aphasia is difficult to predict. People who are younger or have less extensive brain damage do better. The location of the injury is also important. The location is a clue to prognosis. In general, patients tend to recover skills in language comprehension (understanding) more completely than those skills involving expression (speaking or writing).  Document Released: 04/11/2002 Document Revised: 10/12/2011 Document Reviewed: 06/07/2007  [employer]® Patient Information ©2014 [employer].

## 2013-05-26 NOTE — H&P (Signed)
PCP:   [name], MD   Chief Complaint:  aphasia  HPI: 79 yo female with several episodes of difficulty speaking over the last several days.  Symptoms resolve after several hours.  No fevers.  Some blurred vision but no headache.  No n/v/d.  Chipper Oman is expressive.  Speaking fine now.  Transferred here from [facility] for neuro evaluation.  She has also noticed when this occurs, she checks her bp at home and it is always high with sbp over 170 usually.  No facial droopign or weakness in any extremities.  Review of Systems:  Positive and negative as per HPI otherwise all other systems are negative  Past Medical History: Past Medical History  Diagnosis Date  . GERD (gastroesophageal reflux disease)   . Arthritis   . Stroke 10/2002    no residual  . History of TIAs   . Diverticulosis   . PUD (peptic ulcer disease)   . PVD (peripheral vascular disease)   . Hyperlipidemia   . Anxiety   . Depression   . 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 Blood transfusion   . COPD (chronic obstructive pulmonary disease)   . Lymphona, mantle cell, inguinal region/lower limb   . Hypertension    Past Surgical History  Procedure Laterality Date  . Appendectomy    . Colon surgery  09/13/2008    iliostomy for diverticulitis  . Hernia repair    . Tubal ligation    . Throat surgery  1986  . Total knee arthroplasty      2002 left 2008 right  . Reversal of iliostomy  01/2010  . Cataract extraction  2005    bilateral  . Carotid surgery      2004 a month apart right and left    Medications: Prior to Admission medications   Medication Sig Start Date End Date Taking? Authorizing Provider  ALPRAZolam Prudy Feeler) 0.5 MG tablet Take 1 tablet (0.5 mg total) by mouth at bedtime as needed for sleep. 05/15/13  Yes Stacie Glaze, MD  bisoprolol (ZEBETA) 5 MG tablet Take 5 mg by mouth daily at 12 noon.    Yes Historical Provider, MD  cefUROXime (CEFTIN) 500  MG tablet Take 1 tablet (500 mg total) by mouth 2 (two) times daily. 05/24/13  Yes Stacie Glaze, MD  clotrimazole-betamethasone (LOTRISONE) lotion APPLY TOPICALLY TWICE DAILY. 03/21/13  Yes Stacie Glaze, MD  Cyanocobalamin (VITAMIN B-12 PO) Take 1 tablet by mouth daily.   Yes Historical Provider, MD  dabigatran (PRADAXA) 150 MG CAPS Take 1 capsule (150 mg total) by mouth every 12 (twelve) hours. 02/27/13  Yes Stacie Glaze, MD  diclofenac sodium (VOLTAREN) 1 % GEL Apply 2 g topically 4 (four) times daily. 01/09/13  Yes Stacie Glaze, MD  Multiple Vitamin (MULTIVITAMIN WITH MINERALS) TABS tablet Take 1 tablet by mouth daily.   Yes Historical Provider, MD  Vitamin D, Ergocalciferol, (DRISDOL) 50000 UNITS CAPS Take 50,000 Units by mouth every Sunday.   Yes Historical Provider, MD    Allergies:   Allergies  Allergen Reactions  . Propoxyphene Hcl   . Propoxyphene-Acetaminophen     REACTION: Reaction not known    Social History:  reports that she has quit smoking. She has never used smokeless tobacco. She reports that she does not drink alcohol or use illicit drugs.  Family History: Family History  Problem Relation Age of Onset  . Colon cancer Sister  colon  . Hyperlipidemia Mother   . Stroke Mother   . Cancer Father     mesothelioma    Physical Exam: Filed Vitals:   05/26/13 1852 05/26/13 1905 05/26/13 1908 05/26/13 2050  BP: 169/83  158/103 143/87  Pulse: 76  84 79  Temp: 98.1 F (36.7 C) 98 F (36.7 C) 98 F (36.7 C)   TempSrc: Oral  Oral   Resp: 20  14 20   SpO2: 92%  100% 96%   General appearance: alert, cooperative and no distress Head: Normocephalic, without obvious abnormality, atraumatic Eyes: negative Nose: Nares normal. Septum midline. Mucosa normal. No drainage or sinus tenderness. Neck: no JVD and supple, symmetrical, trachea midline Lungs: clear to auscultation bilaterally Heart: regular rate and rhythm, S1, S2 normal, no murmur, click, rub or  gallop Abdomen: soft, non-tender; bowel sounds normal; no masses,  no organomegaly Extremities: extremities normal, atraumatic, no cyanosis or edema Pulses: 2+ and symmetric Skin: Skin color, texture, turgor normal. No rashes or lesions Neurologic: Grossly normal    Labs on Admission:   Recent Labs  05/24/13 1645 05/26/13 1750 05/26/13 1758  NA 135 139 141  K 3.9 4.1 4.1  CL 100 102 103  CO2 27 26  --   GLUCOSE 129* 103* 99  BUN 13 16 18   CREATININE 1.17* 1.10 1.40*  CALCIUM 10.1 10.0  --     Recent Labs  05/24/13 1645 05/26/13 1750  AST 33 34  ALT 33 32  ALKPHOS 78 79  BILITOT 0.4 0.5  PROT 7.3 7.5  ALBUMIN 3.9 4.1    Recent Labs  05/24/13 1645 05/26/13 1750 05/26/13 1758  WBC 5.9 5.3  --   NEUTROABS 4.0 2.9  --   HGB 13.8 13.4 15.0  HCT 42.5 41.5 44.0  MCV 89.3 89.1  --   PLT 211 205  --     Recent Labs  05/24/13 1800 05/26/13 1750  TROPONINI <0.30 <0.30   Radiological Exams on Admission: Ct Angio Head W/cm &/or Wo Cm  05/26/2013   CLINICAL DATA:  Stroke symptoms.  Aphasia beginning at 4:30 p.m.  EXAM: CT ANGIOGRAPHY HEAD AND NECK  TECHNIQUE: Multidetector CT imaging of the head and neck was performed using the standard protocol during bolus administration of intravenous contrast. Multiplanar CT image reconstructions including MIPs were obtained to evaluate the vascular anatomy. Carotid stenosis measurements (when applicable) are obtained utilizing NASCET criteria, using the distal internal carotid diameter as the denominator.  CONTRAST:  80mL OMNIPAQUE IOHEXOL 350 MG/ML SOLN  COMPARISON:  CT head without contrast 05/26/2013.  FINDINGS: CTA HEAD FINDINGS  Atrophy and diffuse white matter disease the similar to the prior exams. No pathologic enhancement is evident. The basal ganglia are intact. The source images demonstrate normal gray-white differentiation. Ventricles are of normal size. No significant extra-axial fluid collection is present.  Dense  atherosclerotic calcifications are present within the cavernous carotid arteries bilaterally without significant stenoses. The ICA termini are within normal limits bilaterally. The A1 and M1 segments are within normal limits. The MCA bifurcations are unremarkable. There is some attenuation of MCA branch vessels bilaterally. The basilar artery is within normal limits. Both posterior cerebral arteries originate from the basilar tip. The PCA branch vessels are within normal limits.  The dural sinuses are patent. The right transverse sinus is dominant.  Review of the MIP images confirms the above findings.  CTA NECK FINDINGS  A standard 3 vessel arch configuration is present. Atherosclerotic calcifications are present in the aortic  arch without significant stenosis of the great vessel origins.  The vertebral arteries both originate from the subclavian arteries. The left vertebral artery is the dominant vessel. There is no significant stenosis of the vertebral arteries in the neck. The right vertebral artery essentially terminates at the PICA ascending only a very small branch to the vertebrobasilar junction. There is some ectasia of the left vertebral artery.  The right common carotid artery demonstrates some atherosclerotic irregularity distally without significant stenosis. The bifurcation is unremarkable. The cervical right ICA demonstrates a focal 50% stenosis in the mid cervical ICA. The distal ICA is unremarkable.  The left common carotid artery demonstrates more calcification distally without a significant stenosis. The bifurcation demonstrates no focal stenosis. The left cervical ICA is unremarkable otherwise.  Of note, there is significant medial deviation of the carotid arteries bilaterally focused at the level of the bifurcations.  And advanced spondylosis of the cervical spine is evident. There is chronic loss of disc height in kyphosis C4-5, C5-6, and C6-7. Degenerative grade 1/2 anterolisthesis at C3-4  measures 5 mm.  The lung apices demonstrate biapical scarring. Mild edema is suggested.  Review of the MIP images confirms the above findings.  IMPRESSION: CTA HEAD IMPRESSION  1. Atrophy and diffuse white matter disease without evidence for an acute infarct. 2. Atherosclerotic changes of the cavernous carotid arteries without significant stenoses.  CTA NECK IMPRESSION  1. Atherosclerotic irregularity within the distal common carotid arteries bilaterally with medial deviation. 2. Focal 50% stenosis of the mid right cervical ICA without other significant stenoses. 3. Mild ectasia of the dominant left vertebral artery.   Electronically Signed   By: Gennette Pac M.D.   On: 05/26/2013 20:12   Ct Head Wo Contrast  05/26/2013   CLINICAL DATA:  A CHF began at 1630 hr today, history of stroke  EXAM: CT HEAD WITHOUT CONTRAST  TECHNIQUE: Contiguous axial images were obtained from the base of the skull through the vertex without intravenous contrast.  COMPARISON:  05/24/2013  FINDINGS: There is no hemorrhage or extra-axial fluid. There is no evidence of mass or hydrocephalus. There is mild to moderate atrophy and there is moderate diffuse bilateral deep white matter low attenuation. Lacunar infarct left basal ganglia measuring about 1 cm, stable. No acute vascular territory infarct. Calvarium intact.  IMPRESSION: No acute findings. Stable involutional change and left lacunar infarct.   Electronically Signed   By: Esperanza Heir M.D.   On: 05/26/2013 18:05   Ct Head Wo Contrast  05/24/2013   CLINICAL DATA:  Altered mental status ; tingling in both upper extremities  EXAM: CT HEAD WITHOUT CONTRAST  TECHNIQUE: Contiguous axial images were obtained from the base of the skull through the vertex without intravenous contrast. Study was obtained within 24 hr of patient's arrival at the emergency department.  COMPARISON:  Brain CT June 09, 2008 and brain MRI October 20, 2008  FINDINGS: There is mild diffuse atrophy. There  is no mass, hemorrhage, extra-axial fluid collection, or midline shift. There is small vessel disease throughout the centra semiovale bilaterally. There is evidence of a prior small infarct in the left external capsule. There is no acute appearing infarct.  Bony calvarium appears intact. The mastoid air cells are clear.  IMPRESSION: Atrophy with small vessel disease. Prior infarct left external capsule. No intracranial mass, hemorrhage, or acute appearing infarct.   Electronically Signed   By: Bretta Bang M.D.   On: 05/24/2013 18:38    Assessment/Plan  79 yo female  with several episodes of expressive aphasia over last several days concerning for TIA  Principal Problem:   Aphasia cva pathway.  Neurology following.  On pradaxa for afib.  ekg nsr now.  recetnly tx for uti with ceftin.  Await further neuro recommendations.    Active Problems:   HYPERLIPIDEMIA   HYPERTENSION   Atrial fibrillation   PERIPHERAL VASCULAR DISEASE   COPD   Large B-cell lymphoma    Hina Gupta A 05/26/2013, 9:22 PM

## 2013-05-26 NOTE — ED Notes (Signed)
Pt getting urine sample, have not complete swallow screen yet.

## 2013-05-26 NOTE — ED Notes (Signed)
Received report from [facility], patient transfer from [facility] ED, called code stroke.  Patient in NAD at time of arrival, slight aphasia present, no neuro deficits to extremities bilaterally.

## 2013-05-26 NOTE — Progress Notes (Signed)
Subjective:    Patient ID: [name], female    DOB: [birth year], 79 y.o.   MRN: [id number]  HPI 79 year old white female, nonsmoker, patient of Dr. Lovell Sheehan is in the emergency department followup from 05/24/2013. She presented to the emergency department with confusion and aphasia. She had a CT scan of her hands show no acute findings. She was on Cipro for UTI at that time that was subsequently discontinued. Patient reports that she is better than she had been but continues to struggle with aphasia. She denies any urinary symptoms. She has a history of atrial fibrillation, peripheral vascular disease, hyperlipidemia. She has a family history of dementia.  Review of Systems  Constitutional: Negative.   Respiratory: Negative.   Cardiovascular: Negative.   Gastrointestinal: Negative.   Endocrine: Negative.   Musculoskeletal: Negative.   Skin: Negative.   Neurological: Positive for speech difficulty.  Psychiatric/Behavioral: Positive for agitation. The patient is nervous/anxious.    Past Medical History  Diagnosis Date  . GERD (gastroesophageal reflux disease)   . Arthritis   . Stroke 10/2002    no residual  . History of TIAs   . Diverticulosis   . PUD (peptic ulcer disease)   . PVD (peripheral vascular disease)   . Hyperlipidemia   . Anxiety   . Depression   . 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 Blood transfusion   . COPD (chronic obstructive pulmonary disease)   . Lymphona, mantle cell, inguinal region/lower limb     History   Social History  . Marital Status: Married    Spouse Name: N/A    Number of Children: 6  . Years of Education: N/A   Occupational History  . retired    Social History Main Topics  . Smoking status: Former Games developer  . Smokeless tobacco: Never Used  . Alcohol Use: No  . Drug Use: No  . Sexual Activity: Yes   Other Topics Concern  . Not on file   Social History Narrative  . No  narrative on file    Past Surgical History  Procedure Laterality Date  . Appendectomy    . Colon surgery  09/13/2008    iliostomy for diverticulitis  . Hernia repair    . Tubal ligation    . Throat surgery  1986  . Total knee arthroplasty      2002 left 2008 right  . Reversal of iliostomy  01/2010  . Cataract extraction  2005    bilateral  . Carotid surgery      2004 a month apart right and left    Family History  Problem Relation Age of Onset  . Colon cancer Sister     colon  . Hyperlipidemia Mother   . Stroke Mother   . Cancer Father     mesothelioma    Allergies  Allergen Reactions  . Propoxyphene Hcl   . Propoxyphene-Acetaminophen     REACTION: Reaction not known    Current Outpatient Prescriptions on File Prior to Visit  Medication Sig Dispense Refill  . ALPRAZolam (XANAX) 0.5 MG tablet Take 1 tablet (0.5 mg total) by mouth at bedtime as needed for sleep.  30 tablet  3  . bisoprolol (ZEBETA) 5 MG tablet Take 5 mg by mouth daily at 12 noon.       . cefUROXime (CEFTIN) 500 MG tablet Take 1 tablet (500 mg total) by mouth 2 (two) times daily.  14 tablet  0  .  ciprofloxacin (CIPRO) 500 MG tablet Take 1 tablet (500 mg total) by mouth 2 (two) times daily.  14 tablet  0  . clotrimazole-betamethasone (LOTRISONE) lotion APPLY TOPICALLY TWICE DAILY.  30 mL  3  . Cyanocobalamin (VITAMIN B-12 PO) Take 1 tablet by mouth daily.      . dabigatran (PRADAXA) 150 MG CAPS Take 1 capsule (150 mg total) by mouth every 12 (twelve) hours.  60 capsule  3  . diclofenac sodium (VOLTAREN) 1 % GEL Apply 2 g topically 4 (four) times daily.  100 g  11  . Multiple Vitamin (MULTIVITAMIN WITH MINERALS) TABS tablet Take 1 tablet by mouth daily.      . Vitamin D, Ergocalciferol, (DRISDOL) 50000 UNITS CAPS Take 50,000 Units by mouth every Sunday.       No current facility-administered medications on file prior to visit.    BP 120/76  Pulse 81  Wt 193 lb (87.544 kg)  BMI 32.63 kg/m2chart     Objective:   Physical Exam  Constitutional: She is oriented to person, place, and time. She appears well-developed and well-nourished.  HENT:  Right Ear: External ear normal.  Left Ear: External ear normal.  Nose: Nose normal.  Mouth/Throat: Oropharynx is clear and moist.  Neck: Normal range of motion. Neck supple.  Cardiovascular: Normal rate, regular rhythm and normal heart sounds.   Pulmonary/Chest: Effort normal and breath sounds normal.  Abdominal: Soft. Bowel sounds are normal.  Musculoskeletal: Normal range of motion.  Neurological: She is alert and oriented to person, place, and time. She has normal reflexes. She displays normal reflexes. No cranial nerve deficit. Coordination normal.  Skin: Skin is warm and dry.  Psychiatric: She has a normal mood and affect.          Assessment & Plan:   assessment: 1. Mental status change 2. Aphasia 3. Atrial fibrillation  Plan: Carotid Doppler study ordered. Will notify patient of results. Consider MRI of the brain. Call the office with any questions or concerns. Recheck as scheduled, and as needed.

## 2013-05-26 NOTE — ED Notes (Addendum)
Family reported pt was seen in ED 10/22, md wanted to admit pt and said pt then could get all tests done, pt opted to go home and follow up with doctor to get all the tests performed.  Pt then started having word salad, just random words coming out but not in order at 1630.

## 2013-05-27 ENCOUNTER — Observation Stay (HOSPITAL_COMMUNITY): Payer: Medicare Other

## 2013-05-27 DIAGNOSIS — E860 Dehydration: Secondary | ICD-10-CM

## 2013-05-27 DIAGNOSIS — G459 Transient cerebral ischemic attack, unspecified: Secondary | ICD-10-CM

## 2013-05-27 DIAGNOSIS — I059 Rheumatic mitral valve disease, unspecified: Secondary | ICD-10-CM

## 2013-05-27 DIAGNOSIS — D649 Anemia, unspecified: Secondary | ICD-10-CM

## 2013-05-27 LAB — GLUCOSE, CAPILLARY
Glucose-Capillary: 102 mg/dL — ABNORMAL HIGH (ref 70–99)
Glucose-Capillary: 127 mg/dL — ABNORMAL HIGH (ref 70–99)

## 2013-05-27 LAB — CREATININE, URINE, RANDOM: Creatinine, Urine: 62.6 mg/dL

## 2013-05-27 LAB — URINALYSIS W MICROSCOPIC + REFLEX CULTURE
Glucose, UA: NEGATIVE mg/dL
Ketones, ur: NEGATIVE mg/dL
Leukocytes, UA: NEGATIVE
Nitrite: NEGATIVE
Protein, ur: NEGATIVE mg/dL
Urobilinogen, UA: 0.2 mg/dL (ref 0.0–1.0)

## 2013-05-27 LAB — LIPID PANEL
Cholesterol: 163 mg/dL (ref 0–200)
HDL: 31 mg/dL — ABNORMAL LOW (ref >39)
Total CHOL/HDL Ratio: 5.3 RATIO
VLDL: 35 mg/dL (ref 0–40)

## 2013-05-27 LAB — HEMOGLOBIN A1C: Mean Plasma Glucose: 123 mg/dL — ABNORMAL HIGH (ref <117)

## 2013-05-27 MED ORDER — STROKE: EARLY STAGES OF RECOVERY BOOK
Freq: Once | Status: AC
  Administered 2013-05-27: 11:00:00
  Filled 2013-05-27: qty 1

## 2013-05-27 MED ORDER — LORAZEPAM 2 MG/ML IJ SOLN
1.0000 mg | Freq: Once | INTRAMUSCULAR | Status: AC
  Administered 2013-05-27: 1 mg via INTRAVENOUS
  Filled 2013-05-27: qty 1

## 2013-05-27 MED ORDER — SODIUM CHLORIDE 0.9 % IV SOLN
INTRAVENOUS | Status: AC
  Administered 2013-05-27 (×2): via INTRAVENOUS

## 2013-05-27 MED ORDER — SODIUM CHLORIDE 0.9 % IV BOLUS (SEPSIS)
500.0000 mL | Freq: Once | INTRAVENOUS | Status: AC
  Administered 2013-05-27: 500 mL via INTRAVENOUS

## 2013-05-27 MED ORDER — CLOTRIMAZOLE 1 % EX CREA
TOPICAL_CREAM | CUTANEOUS | Status: DC | PRN
  Filled 2013-05-27: qty 15

## 2013-05-27 NOTE — Progress Notes (Signed)
Utilization Review Completed.  

## 2013-05-27 NOTE — Progress Notes (Signed)
VASCULAR LAB PRELIMINARY  PRELIMINARY  PRELIMINARY  PRELIMINARY  Carotid Dopplers completed.    Preliminary report:  There is 1-39% ICA stenosis.  Vertebral artery flow is antegrade.  Nikcole Eischeid, RVT 05/27/2013, 11:53 AM

## 2013-05-27 NOTE — Progress Notes (Signed)
[facility]                                                                                Patient Demographics  [name], is a 80 y.o. female, DOB - [birth year], XBJ:[id number]  Admit date - 05/26/2013   Admitting Physician Haydee Monica, MD  Outpatient Primary MD for the patient is [name], MD  LOS - 1   Chief Complaint  Patient presents with  . Code Stroke  . Aphasia        Assessment & Plan    1. Expressive Aphasia - which has resolved completely, initial CT head and CTA head and neck unremarkable, seen by neurology, for now recommendation is to continue Pradaxa which she takes for her atrial fibrillation, stable lipid panel LDL less than 100, A1c pending, will be seen by PT OT and speech, MRI head is pending, echogram & carotid duplex pending. Continue providing supportive care no aphasia right now. Neurologic following.  Lab Results  Component Value Date   CHOL 163 05/27/2013   HDL 31* 05/27/2013   LDLCALC 97 05/27/2013   LDLDIRECT 100.9 02/13/2010   TRIG 176* 05/27/2013   CHOLHDL 5.3 05/27/2013    Lab Results  Component Value Date   HGBA1C  Value: 5.9 (NOTE)                                                                       According to the ADA Clinical Practice Recommendations for 2011, when HbA1c is used as a screening test:   >=6.5%   Diagnostic of Diabetes Mellitus           (if abnormal result  is confirmed)  5.7-6.4%   Increased risk of developing Diabetes Mellitus  References:Diagnosis and Classification of Diabetes Mellitus,Diabetes Care,2011,34(Suppl 1):S62-S69 and Standards of Medical Care in         Diabetes - 2011,Diabetes Care,2011,34  (Suppl 1):S11-S61.* 12/23/2009      2. History of atrial fibrillation. Goal will be rate controlled, telemetry monitoring, on low-dose beta blocker and Pradaxa.    3. Episode of orthostatic hypotension. Patient says this happens to her on a chronic basis at home, likely atrial fibrillation  related, goal will be rate control as above with low-dose beta blocker, gentle IV fluids, TED stockings to be applied. If persists will try midodrine. We'll check TSH    4. History of hypertension. Stable on bisoprolol.      5. Acute renal insufficiency. Baseline creatinine is about 1, check urine electrolytes, place on IV fluids monitor BMP again in the morning      6. H/O COPD, PAD and lymphoma. No acute issues outpatient followup with PCP.     Code Status: Full  Family Communication: None present  Disposition Plan: Home   Procedures CT, CTA head and neck, MRI brain, echogram, carotid duplex   Consults  Neuro   Medications  Scheduled Meds: .  stroke:  mapping our early stages of recovery book   Does not apply Once  . bisoprolol  5 mg Oral Q1200  . cefUROXime  500 mg Oral BID  . dabigatran  150 mg Oral Q12H  . multivitamin with minerals  1 tablet Oral Daily  . sodium chloride  500 mL Intravenous Once   Continuous Infusions: . sodium chloride     PRN Meds:.  DVT Prophylaxis Pradaxa  Lab Results  Component Value Date   PLT 205 05/26/2013    Antibiotics    Anti-infectives   Start     Dose/Rate Route Frequency Ordered Stop   05/26/13 2200  cefUROXime (CEFTIN) tablet 500 mg     500 mg Oral 2 times daily 05/26/13 2120            Subjective:   [name] today has, No headache, No chest pain, No abdominal pain - No Nausea, No new weakness tingling or numbness, No Cough - SOB.    Objective:   Filed Vitals:   05/27/13 0400 05/27/13 0600 05/27/13 0916 05/27/13 0919  BP: 139/74 155/72 84/48 80/40   Pulse: 74 74 83 96  Temp: 98.3 F (36.8 C) 98.3 F (36.8 C)    TempSrc: Oral Oral    Resp: 20     SpO2: 97% 92% 92% 90%    Wt Readings from Last 3 Encounters:  05/26/13 87.544 kg (193 lb)  05/19/13 86.183 kg (190 lb)  05/05/13 86.637 kg (191 lb)     Intake/Output Summary (Last 24 hours) at 05/27/13 1101 Last data filed at 05/26/13  2003  Gross per 24 hour  Intake      0 ml  Output    300 ml  Net   -300 ml    Exam Awake Alert, Oriented X 3, No new F.N deficits, Normal affect [place].AT,PERRAL Supple Neck,No JVD, No cervical lymphadenopathy appriciated.  Symmetrical Chest wall movement, Good air movement bilaterally, CTAB RRR,No Gallops,Rubs or new Murmurs, No Parasternal Heave +ve B.Sounds, Abd Soft, Non tender, No organomegaly appriciated, No rebound - guarding or rigidity. No Cyanosis, Clubbing or edema, No new Rash or bruise      Data Review   Micro Results Recent Results (from the past 240 hour(s))  URINE CULTURE     Status: None   Collection Time    05/19/13  5:22 PM      Result Value Range Status   Colony Count 9,000 COLONIES/ML   Final   Organism ID, Bacteria Insignificant Growth   Final    Radiology Reports Ct Angio Head W/cm &/or Wo Cm  05/26/2013   CLINICAL DATA:  Stroke symptoms.  Aphasia beginning at 4:30 p.m.  EXAM: CT ANGIOGRAPHY HEAD AND NECK  TECHNIQUE: Multidetector CT imaging of the head and neck was performed using the standard protocol during bolus administration of intravenous contrast. Multiplanar CT image reconstructions including MIPs were obtained to evaluate the vascular anatomy. Carotid stenosis measurements (when applicable) are obtained utilizing NASCET criteria, using the distal internal carotid diameter as the denominator.  CONTRAST:  80mL OMNIPAQUE IOHEXOL 350 MG/ML SOLN  COMPARISON:  CT head without contrast 05/26/2013.  FINDINGS: CTA HEAD FINDINGS  Atrophy and diffuse white matter disease the similar to the prior exams. No pathologic enhancement is evident. The basal ganglia are intact. The source images demonstrate normal gray-white differentiation. Ventricles are of normal size. No significant extra-axial fluid collection is present.  Dense atherosclerotic calcifications are present within the cavernous carotid arteries bilaterally without significant stenoses. The ICA  termini are  within normal limits bilaterally. The A1 and M1 segments are within normal limits. The MCA bifurcations are unremarkable. There is some attenuation of MCA branch vessels bilaterally. The basilar artery is within normal limits. Both posterior cerebral arteries originate from the basilar tip. The PCA branch vessels are within normal limits.  The dural sinuses are patent. The right transverse sinus is dominant.  Review of the MIP images confirms the above findings.  CTA NECK FINDINGS  A standard 3 vessel arch configuration is present. Atherosclerotic calcifications are present in the aortic arch without significant stenosis of the great vessel origins.  The vertebral arteries both originate from the subclavian arteries. The left vertebral artery is the dominant vessel. There is no significant stenosis of the vertebral arteries in the neck. The right vertebral artery essentially terminates at the PICA ascending only a very small branch to the vertebrobasilar junction. There is some ectasia of the left vertebral artery.  The right common carotid artery demonstrates some atherosclerotic irregularity distally without significant stenosis. The bifurcation is unremarkable. The cervical right ICA demonstrates a focal 50% stenosis in the mid cervical ICA. The distal ICA is unremarkable.  The left common carotid artery demonstrates more calcification distally without a significant stenosis. The bifurcation demonstrates no focal stenosis. The left cervical ICA is unremarkable otherwise.  Of note, there is significant medial deviation of the carotid arteries bilaterally focused at the level of the bifurcations.  And advanced spondylosis of the cervical spine is evident. There is chronic loss of disc height in kyphosis C4-5, C5-6, and C6-7. Degenerative grade 1/2 anterolisthesis at C3-4 measures 5 mm.  The lung apices demonstrate biapical scarring. Mild edema is suggested.  Review of the MIP images confirms the above findings.   IMPRESSION: CTA HEAD IMPRESSION  1. Atrophy and diffuse white matter disease without evidence for an acute infarct. 2. Atherosclerotic changes of the cavernous carotid arteries without significant stenoses.  CTA NECK IMPRESSION  1. Atherosclerotic irregularity within the distal common carotid arteries bilaterally with medial deviation. 2. Focal 50% stenosis of the mid right cervical ICA without other significant stenoses. 3. Mild ectasia of the dominant left vertebral artery.   Electronically Signed   By: Gennette Pac M.D.   On: 05/26/2013 20:12   Ct Head Wo Contrast  05/26/2013   CLINICAL DATA:  A CHF began at 1630 hr today, history of stroke  EXAM: CT HEAD WITHOUT CONTRAST  TECHNIQUE: Contiguous axial images were obtained from the base of the skull through the vertex without intravenous contrast.  COMPARISON:  05/24/2013  FINDINGS: There is no hemorrhage or extra-axial fluid. There is no evidence of mass or hydrocephalus. There is mild to moderate atrophy and there is moderate diffuse bilateral deep white matter low attenuation. Lacunar infarct left basal ganglia measuring about 1 cm, stable. No acute vascular territory infarct. Calvarium intact.  IMPRESSION: No acute findings. Stable involutional change and left lacunar infarct.   Electronically Signed   By: Esperanza Heir M.D.   On: 05/26/2013 18:05   Ct Head Wo Contrast  05/24/2013   CLINICAL DATA:  Altered mental status ; tingling in both upper extremities  EXAM: CT HEAD WITHOUT CONTRAST  TECHNIQUE: Contiguous axial images were obtained from the base of the skull through the vertex without intravenous contrast. Study was obtained within 24 hr of patient's arrival at the emergency department.  COMPARISON:  Brain CT June 09, 2008 and brain MRI October 20, 2008  FINDINGS: There is mild diffuse atrophy.  There is no mass, hemorrhage, extra-axial fluid collection, or midline shift. There is small vessel disease throughout the centra semiovale  bilaterally. There is evidence of a prior small infarct in the left external capsule. There is no acute appearing infarct.  Bony calvarium appears intact. The mastoid air cells are clear.  IMPRESSION: Atrophy with small vessel disease. Prior infarct left external capsule. No intracranial mass, hemorrhage, or acute appearing infarct.   Electronically Signed   By: Bretta Bang M.D.   On: 05/24/2013 18:38    CBC  Recent Labs Lab 05/24/13 1645 05/26/13 1750 05/26/13 1758  WBC 5.9 5.3  --   HGB 13.8 13.4 15.0  HCT 42.5 41.5 44.0  PLT 211 205  --   MCV 89.3 89.1  --   MCH 29.0 28.8  --   MCHC 32.5 32.3  --   RDW 14.1 14.2  --   LYMPHSABS 1.2 1.6  --   MONOABS 0.6 0.6  --   EOSABS 0.2 0.2  --   BASOSABS 0.0 0.0  --     Chemistries   Recent Labs Lab 05/24/13 1645 05/26/13 1750 05/26/13 1758  NA 135 139 141  K 3.9 4.1 4.1  CL 100 102 103  CO2 27 26  --   GLUCOSE 129* 103* 99  BUN 13 16 18   CREATININE 1.17* 1.10 1.40*  CALCIUM 10.1 10.0  --   AST 33 34  --   ALT 33 32  --   ALKPHOS 78 79  --   BILITOT 0.4 0.5  --    ------------------------------------------------------------------------------------------------------------------ CrCl is unknown because both a height and weight (above a minimum accepted value) are required for this calculation. ------------------------------------------------------------------------------------------------------------------ No results found for this basename: HGBA1C,  in the last 72 hours ------------------------------------------------------------------------------------------------------------------  Recent Labs  05/27/13 0425  CHOL 163  HDL 31*  LDLCALC 97  TRIG 960*  CHOLHDL 5.3   ------------------------------------------------------------------------------------------------------------------ No results found for this basename: TSH, T4TOTAL, FREET3, T3FREE, THYROIDAB,  in the last 72  hours ------------------------------------------------------------------------------------------------------------------ No results found for this basename: VITAMINB12, FOLATE, FERRITIN, TIBC, IRON, RETICCTPCT,  in the last 72 hours  Coagulation profile  Recent Labs Lab 05/24/13 1800 05/26/13 1750  INR 1.47 1.43    No results found for this basename: DDIMER,  in the last 72 hours  Cardiac Enzymes  Recent Labs Lab 05/24/13 1800 05/26/13 1750  TROPONINI <0.30 <0.30   ------------------------------------------------------------------------------------------------------------------ No components found with this basename: POCBNP,      Time Spent in minutes  35   [name] K M.D on 05/27/2013 at 11:01 AM  Between 7am to 7pm - Pager - [phone]  After 7pm go to [email or web address] - password [email or web address]  And look for the night coverage person covering for me after hours  [facility] Office  [phone]

## 2013-05-27 NOTE — Evaluation (Signed)
Physical Therapy Evaluation Patient Details Name: [name] MRN: [id number] DOB: 05-12-1928 Today's Date: 05/27/2013 Time: 9562-1308 PT Time Calculation (min): 18 min  PT Assessment / Plan / Recommendation History of Present Illness  Patient is an 79 yo female admitted with aphasia, HTN to f/o CVA/TIA.  Patient with h/o CVA with no deficits.  Clinical Impression  Patient presents with problems listed below.  Will benefit from acute PT to maximize independence prior to discharge home with husband.  Mobility limited today due to lightheadedness.    PT Assessment  Patient needs continued PT services    Follow Up Recommendations  Outpatient PT;Supervision/Assistance - 24 hour    Does the patient have the potential to tolerate intense rehabilitation      Barriers to Discharge        Equipment Recommendations  None recommended by PT    Recommendations for Other Services     Frequency Min 4X/week    Precautions / Restrictions Precautions Precautions: Fall Precaution Comments: Lightheaded with ambulation with low BP Restrictions Weight Bearing Restrictions: No   Pertinent Vitals/Pain In sitting, patient with BP of 84/48 and HR of 83. In standing, patient with BP 80/40 and HR ranging from 96-135. RN called to room.     Mobility  Bed Mobility Bed Mobility: Not assessed (Patient in chair as PT entered room) Transfers Transfers: Sit to Stand;Stand to Sit Sit to Stand: 4: Min guard;With upper extremity assist;With armrests;From chair/3-in-1 Stand to Sit: 4: Min guard;With upper extremity assist;With armrests;To chair/3-in-1 Details for Transfer Assistance: Min guard assist for safety/balance.   Ambulation/Gait Ambulation/Gait Assistance: 4: Min assist Ambulation Distance (Feet): 30 Feet Assistive device: None Ambulation/Gait Assistance Details: Verbal cues to move slowly.  Patient with decreased balance, reaching for objects for support.  Patient reported feeling  lightheaded.  BP 80/40 and HR ranging 96-135 in standing.  Patient returned to sitting and RN notified. Gait Pattern: Step-through pattern;Decreased stride length;Trunk flexed Gait velocity: Slow Modified Rankin (Stroke Patients Only) Pre-Morbid Rankin Score: No symptoms Modified Rankin: No significant disability    Exercises     PT Diagnosis: Difficulty walking;Abnormality of gait;Generalized weakness  PT Problem List: Decreased strength;Decreased activity tolerance;Decreased balance;Decreased mobility;Cardiopulmonary status limiting activity PT Treatment Interventions: DME instruction;Gait training;Stair training;Functional mobility training;Balance training;Patient/family education     PT Goals(Current goals can be found in the care plan section) Acute Rehab PT Goals Patient Stated Goal: To feel better.  To go home soon. PT Goal Formulation: With patient Time For Goal Achievement: 06/03/13 Potential to Achieve Goals: Good  Visit Information  Last PT Received On: 05/27/13 Assistance Needed: +1 History of Present Illness: Patient is an 79 yo female admitted with aphasia, HTN to f/o CVA/TIA.  Patient with h/o CVA with no deficits.       Prior Functioning  Home Living Family/patient expects to be discharged to:: Private residence Living Arrangements: Spouse/significant other Available Help at Discharge: Family;Available 24 hours/day Type of Home: House Home Access: Level entry Home Layout: Two level Alternate Level Stairs-Number of Steps: flight Alternate Level Stairs-Rails: Right Home Equipment: Walker - 2 wheels;Bedside commode Prior Function Level of Independence: Independent Comments: Patient had just resumed OP PT for knees Communication Communication: Expressive difficulties    Cognition  Cognition Arousal/Alertness: Awake/alert Behavior During Therapy: WFL for tasks assessed/performed Overall Cognitive Status: Within Functional Limits for tasks assessed     Extremity/Trunk Assessment Upper Extremity Assessment Upper Extremity Assessment: Overall WFL for tasks assessed (Symmetrical) Lower Extremity Assessment Lower Extremity Assessment: Generalized  weakness (Symmetrical)   Balance    End of Session PT - End of Session Equipment Utilized During Treatment: Gait belt Activity Tolerance: Patient limited by fatigue;Treatment limited secondary to medical complications (Comment) (Decrease in BP and increase in HR) Patient left: in chair;with call bell/phone within reach;with nursing/sitter in room Nurse Communication: Mobility status (Vital signs)  GP Functional Assessment Tool Used: Clinical judgement Functional Limitation: Mobility: Walking and moving around Mobility: Walking and Moving Around Current Status ([id number]): At least 1 percent but less than 20 percent impaired, limited or restricted Mobility: Walking and Moving Around Goal Status [phone]): 0 percent impaired, limited or restricted   Vena Austria 05/27/2013, 9:56 AM Durenda Hurt. Renaldo Fiddler, [facility] Acute Rehab Services Pager [phone]

## 2013-05-27 NOTE — Progress Notes (Signed)
Pt agitated and combative swinging at staff. Pt unable to be calmed and redirected.  Pulling off tele and pulling at IV. Pt. Returned from MRI confused @ 1900.  Night hospitalist notified.  Order received for restraints.  RN called daughter-[name]- to see if family available to sit with patient.  Family available and will return to hospital to sit with patient.  Restraints not implemented at this time.  Will reevaluate and continue to monitor patient.

## 2013-05-27 NOTE — Progress Notes (Signed)
Patient became lightheaded when getting up with PT.  BP 80/60 sitting, 80/43 standing, and 103/56 lying down.  HR range from 80-120, only staying above the 80's for a brief period.  Hx of A Fib. Patient states that these episodes happen at home as well and she "lies back down until she feels better".  MD notified and orders received. Will continue to monitor.

## 2013-05-27 NOTE — Progress Notes (Signed)
  Echocardiogram 2D Echocardiogram has been performed.  [name] 05/27/2013, 10:11 AM

## 2013-05-27 NOTE — Progress Notes (Signed)
Stroke Team Progress Note  HISTORY MARGURETE GUAMAN is an 79 y.o. female with a past medical history significant for HTN, hyperlipidemia, TIA, stroke without residual deficits, s/p bilateral CEA several years ago, atrial fibrillation currently on Pradaxa, PVD, PUD, depression/anxiety, initially seen at [facility] ED and then transferred to [facility] ED for further evaluation of acute dysphasia.  She was reportedly seen in the ED on 10/22 due to difficulty speaking but got better and decided not to stay in the hospital for further testing.  On 10/24, around 430 pm, she was talking to her daughter on the phone when suddenly realized that she was having difficulty expressing herself although remarks that she was able to comprehend well and was not really confused.  She was told by her daughter that she was " getting words out ok but in the wrong order". Episode lasted at least 2 hours.  Denies associated HA, vertigo, double vision, difficulty swallowing, imbalance, focal weakness-numbness, or visual impairment.  Her language rapidly improved and thus was not considered a proper candidate for thrombolysis.  CT/CTA brain and neck are unimpressive.  Presently, she feels back to her baseline.  Patient was not a TPA candidate secondary to resolution of symptoms. She was admitted to the 3W for further evaluation and treatment.  SUBJECTIVE Patient alone in her room. Currently alert, responsive, denies any issues. States she, "doesn't feel confident" in her speech. Doesn't believe she is currently having difficulties but does not feel confident in her speech.    OBJECTIVE Most recent Vital Signs: Filed Vitals:   05/27/13 0004 05/27/13 0200 05/27/13 0400 05/27/13 0600  BP: 163/75 141/89 139/74 155/72  Pulse: 74 74 74 74  Temp: 98.4 F (36.9 C) 98.4 F (36.9 C) 98.3 F (36.8 C) 98.3 F (36.8 C)  TempSrc: Oral Oral Oral Oral  Resp: 20 20 20    SpO2: 98% 98% 97% 92%   CBG (last 3)   Recent Labs  05/24/13 1751  05/26/13 1735  GLUCAP 108* 93    IV Fluid Intake:     MEDICATIONS  .  stroke: mapping our early stages of recovery book   Does not apply Once  . bisoprolol  5 mg Oral Q1200  . cefUROXime  500 mg Oral BID  . dabigatran  150 mg Oral Q12H  . multivitamin with minerals  1 tablet Oral Daily   PRN:    Diet:  Cardiac  Activity:  Bedrest DVT Prophylaxis:  pradaxa  CLINICALLY SIGNIFICANT STUDIES Basic Metabolic Panel:  Recent Labs Lab 05/24/13 1645 05/26/13 1750 05/26/13 1758  NA 135 139 141  K 3.9 4.1 4.1  CL 100 102 103  CO2 27 26  --   GLUCOSE 129* 103* 99  BUN 13 16 18   CREATININE 1.17* 1.10 1.40*  CALCIUM 10.1 10.0  --    Liver Function Tests:  Recent Labs Lab 05/24/13 1645 05/26/13 1750  AST 33 34  ALT 33 32  ALKPHOS 78 79  BILITOT 0.4 0.5  PROT 7.3 7.5  ALBUMIN 3.9 4.1   CBC:  Recent Labs Lab 05/24/13 1645 05/26/13 1750 05/26/13 1758  WBC 5.9 5.3  --   NEUTROABS 4.0 2.9  --   HGB 13.8 13.4 15.0  HCT 42.5 41.5 44.0  MCV 89.3 89.1  --   PLT 211 205  --    Coagulation:  Recent Labs Lab 05/24/13 1800 05/26/13 1750  LABPROT 17.4* 17.1*  INR 1.47 1.43   Cardiac Enzymes:  Recent Labs Lab 05/24/13 1800  05/26/13 1750  TROPONINI <0.30 <0.30   Urinalysis:  Recent Labs Lab 05/24/13 1749 05/26/13 1817  COLORURINE YELLOW YELLOW  LABSPEC 1.013 1.014  PHURINE 7.0 6.5  GLUCOSEU NEGATIVE NEGATIVE  HGBUR NEGATIVE NEGATIVE  BILIRUBINUR NEGATIVE NEGATIVE  KETONESUR NEGATIVE NEGATIVE  PROTEINUR NEGATIVE NEGATIVE  UROBILINOGEN 0.2 0.2  NITRITE NEGATIVE NEGATIVE  LEUKOCYTESUR NEGATIVE TRACE*   Lipid Panel    Component Value Date/Time   CHOL 163 05/27/2013 0425   TRIG 176* 05/27/2013 0425   HDL 31* 05/27/2013 0425   CHOLHDL 5.3 05/27/2013 0425   VLDL 35 05/27/2013 0425   LDLCALC 97 05/27/2013 0425   HgbA1C  Lab Results  Component Value Date   HGBA1C  Value: 5.9 (NOTE)                                                                        According to the ADA Clinical Practice Recommendations for 2011, when HbA1c is used as a screening test:   >=6.5%   Diagnostic of Diabetes Mellitus           (if abnormal result  is confirmed)  5.7-6.4%   Increased risk of developing Diabetes Mellitus  References:Diagnosis and Classification of Diabetes Mellitus,Diabetes Care,2011,34(Suppl 1):S62-S69 and Standards of Medical Care in         Diabetes - 2011,Diabetes Care,2011,34  (Suppl 1):S11-S61.* 12/23/2009    Urine Drug Screen:     Component Value Date/Time   LABOPIA NONE DETECTED 05/26/2013 1817   COCAINSCRNUR NONE DETECTED 05/26/2013 1817   LABBENZ NONE DETECTED 05/26/2013 1817   AMPHETMU NONE DETECTED 05/26/2013 1817   THCU NONE DETECTED 05/26/2013 1817   LABBARB NONE DETECTED 05/26/2013 1817    Alcohol Level:  Recent Labs Lab 05/26/13 1750  ETH <11    Ct Angio Head W/cm &/or Wo Cm  05/26/2013   CLINICAL DATA:  Stroke symptoms.  Aphasia beginning at 4:30 p.m.  EXAM: CT ANGIOGRAPHY HEAD AND NECK  TECHNIQUE: Multidetector CT imaging of the head and neck was performed using the standard protocol during bolus administration of intravenous contrast. Multiplanar CT image reconstructions including MIPs were obtained to evaluate the vascular anatomy. Carotid stenosis measurements (when applicable) are obtained utilizing NASCET criteria, using the distal internal carotid diameter as the denominator.  CONTRAST:  80mL OMNIPAQUE IOHEXOL 350 MG/ML SOLN  COMPARISON:  CT head without contrast 05/26/2013.  FINDINGS: CTA HEAD FINDINGS  Atrophy and diffuse white matter disease the similar to the prior exams. No pathologic enhancement is evident. The basal ganglia are intact. The source images demonstrate normal gray-white differentiation. Ventricles are of normal size. No significant extra-axial fluid collection is present.  Dense atherosclerotic calcifications are present within the cavernous carotid arteries bilaterally without significant stenoses. The  ICA termini are within normal limits bilaterally. The A1 and M1 segments are within normal limits. The MCA bifurcations are unremarkable. There is some attenuation of MCA branch vessels bilaterally. The basilar artery is within normal limits. Both posterior cerebral arteries originate from the basilar tip. The PCA branch vessels are within normal limits.  The dural sinuses are patent. The right transverse sinus is dominant.  Review of the MIP images confirms the above findings.  CTA NECK FINDINGS  A standard 3 vessel arch  configuration is present. Atherosclerotic calcifications are present in the aortic arch without significant stenosis of the great vessel origins.  The vertebral arteries both originate from the subclavian arteries. The left vertebral artery is the dominant vessel. There is no significant stenosis of the vertebral arteries in the neck. The right vertebral artery essentially terminates at the PICA ascending only a very small branch to the vertebrobasilar junction. There is some ectasia of the left vertebral artery.  The right common carotid artery demonstrates some atherosclerotic irregularity distally without significant stenosis. The bifurcation is unremarkable. The cervical right ICA demonstrates a focal 50% stenosis in the mid cervical ICA. The distal ICA is unremarkable.  The left common carotid artery demonstrates more calcification distally without a significant stenosis. The bifurcation demonstrates no focal stenosis. The left cervical ICA is unremarkable otherwise.  Of note, there is significant medial deviation of the carotid arteries bilaterally focused at the level of the bifurcations.  And advanced spondylosis of the cervical spine is evident. There is chronic loss of disc height in kyphosis C4-5, C5-6, and C6-7. Degenerative grade 1/2 anterolisthesis at C3-4 measures 5 mm.  The lung apices demonstrate biapical scarring. Mild edema is suggested.  Review of the MIP images confirms the  above findings.  IMPRESSION: CTA HEAD IMPRESSION  1. Atrophy and diffuse white matter disease without evidence for an acute infarct. 2. Atherosclerotic changes of the cavernous carotid arteries without significant stenoses.  CTA NECK IMPRESSION  1. Atherosclerotic irregularity within the distal common carotid arteries bilaterally with medial deviation. 2. Focal 50% stenosis of the mid right cervical ICA without other significant stenoses. 3. Mild ectasia of the dominant left vertebral artery.   Electronically Signed   By: Gennette Pac M.D.   On: 05/26/2013 20:12   Ct Head Wo Contrast  05/26/2013   CLINICAL DATA:  A CHF began at 1630 hr today, history of stroke  EXAM: CT HEAD WITHOUT CONTRAST  TECHNIQUE: Contiguous axial images were obtained from the base of the skull through the vertex without intravenous contrast.  COMPARISON:  05/24/2013  FINDINGS: There is no hemorrhage or extra-axial fluid. There is no evidence of mass or hydrocephalus. There is mild to moderate atrophy and there is moderate diffuse bilateral deep white matter low attenuation. Lacunar infarct left basal ganglia measuring about 1 cm, stable. No acute vascular territory infarct. Calvarium intact.  IMPRESSION: No acute findings. Stable involutional change and left lacunar infarct.   Electronically Signed   By: Esperanza Heir M.D.   On: 05/26/2013 18:05    CT of the brain   10/24: CTA head and neck CTA HEAD IMPRESSION  1. Atrophy and diffuse white matter disease without evidence for an  acute infarct.  2. Atherosclerotic changes of the cavernous carotid arteries without  significant stenoses.  CTA NECK IMPRESSION  1. Atherosclerotic irregularity within the distal common carotid  arteries bilaterally with medial deviation.  2. Focal 50% stenosis of the mid right cervical ICA without other  significant stenoses.  3. Mild ectasia of the dominant left vertebral artery.   10/24 CT head There is no hemorrhage or extra-axial fluid.  There is no evidence of  mass or hydrocephalus. There is mild to moderate atrophy and there  is moderate diffuse bilateral deep white matter low attenuation.  Lacunar infarct left basal ganglia measuring about 1 cm, stable. No  acute vascular territory infarct. Calvarium intact.  MRI of the brain    MRA of the brain    2D Echocardiogram  Carotid Doppler    CXR    Therapy Recommendations pending  Physical Exam   Gen:NAD, pleasant CV: RRR, no m/r/g, no carotid bruits Pulm: CTA bilat  Mental Status:  Alert, awake, oriented x 4, thought content appropriate. Speech fluent without evidence of aphasia. Able to correctly name wristwatch, watch crystal, ball point pen. Able to follow 3 step commands without difficulty.  Cranial Nerves:  II: pupils equal, round, reactive to light III,IV, VI: ptosis not present, extra-ocular motions intact bilaterally  V,VII: smile symmetric, facial light touch sensation normal bilaterally  VIII: hearing normal bilaterally  XI: bilateral shoulder shrug  XII: midline tongue extension without atrophy or fasciculations  Motor:  Right : strength 5/5 in all extremities, no drift noted Tone and bulk:normal tone throughout; no atrophy noted  Sensory:  light touch intact throughout, bilaterally  Deep Tendon Reflexes:  1+ all over  Plantars:  Right: downgoing Left: downgoing  Cerebellar:  normal finger-to-nose Gait:  No tested   ASSESSMENT Ms. EBONY YORIO is a 79 y.o. female presenting with expressive aphasia. Did not receive t-PA due to rapidly improving symptoms. Concern for embolic event.  On Pradaxa  prior to admission. Now on Pradaxa for secondary stroke prevention. Work up underway.   TIA  A fib  HLD  HTN  Hospital day # 1  TREATMENT/PLAN  MRI of brain  2D echo  Continue Pradaxa  Follow up lipid panel and HbA1c  Rehab evaluation  Elspeth Cho, DO Neurology-Stroke

## 2013-05-28 LAB — OSMOLALITY: Osmolality: 290 mOsm/kg (ref 275–300)

## 2013-05-28 LAB — CBC
HCT: 39.2 % (ref 36.0–46.0)
Hemoglobin: 12.9 g/dL (ref 12.0–15.0)
MCV: 89.5 fL (ref 78.0–100.0)
RDW: 14.3 % (ref 11.5–15.5)
WBC: 4.7 10*3/uL (ref 4.0–10.5)

## 2013-05-28 LAB — BASIC METABOLIC PANEL
CO2: 26 mEq/L (ref 19–32)
Chloride: 104 mEq/L (ref 96–112)
Creatinine, Ser: 1.1 mg/dL (ref 0.50–1.10)
GFR calc Af Amer: 52 mL/min — ABNORMAL LOW (ref >90)
Glucose, Bld: 112 mg/dL — ABNORMAL HIGH (ref 70–99)

## 2013-05-28 LAB — GLUCOSE, CAPILLARY
Glucose-Capillary: 122 mg/dL — ABNORMAL HIGH (ref 70–99)
Glucose-Capillary: 101 mg/dL — ABNORMAL HIGH (ref 70–99)
Glucose-Capillary: 115 mg/dL — ABNORMAL HIGH (ref 70–99)

## 2013-05-28 LAB — OSMOLALITY, URINE: Osmolality, Ur: 381 mOsm/kg — ABNORMAL LOW (ref 390–1090)

## 2013-05-28 MED ORDER — APIXABAN 2.5 MG PO TABS
2.5000 mg | ORAL_TABLET | Freq: Two times a day (BID) | ORAL | Status: DC
  Administered 2013-05-28 – 2013-05-29 (×3): 2.5 mg via ORAL
  Filled 2013-05-28 (×4): qty 1

## 2013-05-28 MED ORDER — MIDODRINE HCL 2.5 MG PO TABS
2.5000 mg | ORAL_TABLET | Freq: Three times a day (TID) | ORAL | Status: DC
  Administered 2013-05-28: 2.5 mg via ORAL
  Filled 2013-05-28 (×6): qty 1

## 2013-05-28 MED ORDER — SODIUM CHLORIDE 0.9 % IV SOLN
INTRAVENOUS | Status: AC
  Administered 2013-05-28: 11:00:00 via INTRAVENOUS

## 2013-05-28 MED ORDER — MIDODRINE HCL 5 MG PO TABS
5.0000 mg | ORAL_TABLET | Freq: Three times a day (TID) | ORAL | Status: DC
  Administered 2013-05-28 – 2013-05-29 (×2): 5 mg via ORAL
  Filled 2013-05-28 (×5): qty 1

## 2013-05-28 MED ORDER — ALPRAZOLAM 0.5 MG PO TABS: 0.5000 mg | ORAL_TABLET | Freq: Once | ORAL | Status: DC

## 2013-05-28 NOTE — Progress Notes (Signed)
Pt. 0400 Orthostatic vital signs.  BP laying 149/74 sitting 124 71 standing 74/51.  Pt. Denies any dizziness, SOB, presyncope/syncope.  Night hospitalist notified.  No orders received.  Will continue to monitor.

## 2013-05-28 NOTE — Progress Notes (Signed)
Patient sat up in chair for approximately two hours today.

## 2013-05-28 NOTE — Progress Notes (Signed)
PT Cancellation Note  Patient Details Name: [name] MRN: [id number] DOB: 05-Jan-1928   Cancelled Treatment:    Reason Eval/Treat Not Completed: Patient declined, no reason specified.  Attempted to see patient x2 (am and pm).  Patient declined PT, reporting she is too weak and too shaky to get up.  Provided max encouragement along with family.  Patient declined.  Discussed importance of getting OOB.  Will return in am for PT session.   Vena Austria 05/28/2013, 3:32 PM Durenda Hurt. Renaldo Fiddler, [facility] Acute Rehab Services Pager [phone]

## 2013-05-28 NOTE — Progress Notes (Signed)
[facility]                                                                                Patient Demographics  [name], is a 79 y.o. female, DOB - 05/07/1928, ZOX:[id number]  Admit date - 05/26/2013   Admitting Physician Lisa Monica, MD  Outpatient Primary MD for the patient is Lisa Mew, MD  LOS - 2   Chief Complaint  Patient presents with  . Code Stroke  . Aphasia        Assessment & Plan    1. Expressive Aphasia - small CVA per MRI, seen by neurology, for now recommendation is to switch from Pradaxa to Eliquis, stable lipid panel LDL less than 100, A1c pending, will be seen by PT OT and speech, stable echogram & carotid duplex . Continue providing supportive care no aphasia right now. Neurologic following.  Lab Results  Component Value Date   CHOL 163 05/27/2013   HDL 31* 05/27/2013   LDLCALC 97 05/27/2013   LDLDIRECT 100.9 02/13/2010   TRIG 176* 05/27/2013   CHOLHDL 5.3 05/27/2013    Lab Results  Component Value Date   HGBA1C 5.9* 05/27/2013      2. History of atrial fibrillation. Goal will be rate controlled, telemetry monitoring, on low-dose beta blocker and Pradaxa.    3. Episode of orthostatic hypotension. Patient says this happens to her on a chronic basis at home, will add midodrine, continues to be orthostatic however no symptoms now, continue low-dose beta blocker, gentle IV fluids, TED stockings to be applied. If persists will try midodrine. Stable TSH.  Lab Results  Component Value Date   TSH 1.59 01/16/2013       4. History of hypertension. Stable on bisoprolol.      5. Acute renal insufficiency. Was secondary to dehydration and has resolved with IV fluids.     6. Atrial fibrillation goal is rate controlled. Continue beta blocker and now Eliquis.    7. H/O COPD, PAD and lymphoma. No acute issues outpatient followup with PCP.       Code Status: Full  Family Communication: None  present  Disposition Plan: Home   Procedures CT, CTA head and neck, MRI brain, echogram, carotid duplex   Consults  Neuro   Medications  Scheduled Meds: . apixaban  2.5 mg Oral BID  . bisoprolol  5 mg Oral Q1200  . cefUROXime  500 mg Oral BID  . midodrine  2.5 mg Oral TID WC  . multivitamin with minerals  1 tablet Oral Daily   Continuous Infusions: . clotrimazole     PRN Meds:.clotrimazole  DVT Prophylaxis Pradaxa  Lab Results  Component Value Date   PLT 163 05/28/2013    Antibiotics    Anti-infectives   Start     Dose/Rate Route Frequency Ordered Stop   05/26/13 2200  cefUROXime (CEFTIN) tablet 500 mg     500 mg Oral 2 times daily 05/26/13 2120            Subjective:   [name] today has, No headache, No chest pain, No abdominal pain - No Nausea, No new weakness tingling or numbness, No Cough - SOB.  Objective:   Filed Vitals:   05/28/13 0356 05/28/13 0803 05/28/13 0805 05/28/13 0807  BP: 116/74 90/71 77/46  77/48  Pulse: 82 83 88 95  Temp: 98.6 F (37 C)   98.4 F (36.9 C)  TempSrc:    Oral  Resp: 18     SpO2: 98%       Wt Readings from Last 3 Encounters:  05/26/13 87.544 kg (193 lb)  05/19/13 86.183 kg (190 lb)  05/05/13 86.637 kg (191 lb)     Intake/Output Summary (Last 24 hours) at 05/28/13 1045 Last data filed at 05/27/13 1400  Gross per 24 hour  Intake    390 ml  Output      0 ml  Net    390 ml    Exam Awake Alert, Oriented X 3, No new F.N deficits, Normal affect [name].AT,PERRAL Supple Neck,No JVD, No cervical lymphadenopathy appriciated.  Symmetrical Chest wall movement, Good air movement bilaterally, CTAB RRR,No Gallops,Rubs or new Murmurs, No Parasternal Heave +ve B.Sounds, Abd Soft, Non tender, No organomegaly appriciated, No rebound - guarding or rigidity. No Cyanosis, Clubbing or edema, No new Rash or bruise      Data Review   Micro Results Recent Results (from the past 240 hour(s))  URINE CULTURE      Status: None   Collection Time    05/19/13  5:22 PM      Result Value Range Status   Colony Count 9,000 COLONIES/ML   Final   Organism ID, Bacteria Insignificant Growth   Final    Radiology Reports Ct Angio Head W/cm &/or Wo Cm  05/26/2013   CLINICAL DATA:  Stroke symptoms.  Aphasia beginning at 4:30 p.m.  EXAM: CT ANGIOGRAPHY HEAD AND NECK  TECHNIQUE: Multidetector CT imaging of the head and neck was performed using the standard protocol during bolus administration of intravenous contrast. Multiplanar CT image reconstructions including MIPs were obtained to evaluate the vascular anatomy. Carotid stenosis measurements (when applicable) are obtained utilizing NASCET criteria, using the distal internal carotid diameter as the denominator.  CONTRAST:  80mL OMNIPAQUE IOHEXOL 350 MG/ML SOLN  COMPARISON:  CT head without contrast 05/26/2013.  FINDINGS: CTA HEAD FINDINGS  Atrophy and diffuse white matter disease the similar to the prior exams. No pathologic enhancement is evident. The basal ganglia are intact. The source images demonstrate normal gray-white differentiation. Ventricles are of normal size. No significant extra-axial fluid collection is present.  Dense atherosclerotic calcifications are present within the cavernous carotid arteries bilaterally without significant stenoses. The ICA termini are within normal limits bilaterally. The A1 and M1 segments are within normal limits. The MCA bifurcations are unremarkable. There is some attenuation of MCA branch vessels bilaterally. The basilar artery is within normal limits. Both posterior cerebral arteries originate from the basilar tip. The PCA branch vessels are within normal limits.  The dural sinuses are patent. The right transverse sinus is dominant.  Review of the MIP images confirms the above findings.  CTA NECK FINDINGS  A standard 3 vessel arch configuration is present. Atherosclerotic calcifications are present in the aortic arch without  significant stenosis of the great vessel origins.  The vertebral arteries both originate from the subclavian arteries. The left vertebral artery is the dominant vessel. There is no significant stenosis of the vertebral arteries in the neck. The right vertebral artery essentially terminates at the PICA ascending only a very small branch to the vertebrobasilar junction. There is some ectasia of the left vertebral artery.  The right common carotid  artery demonstrates some atherosclerotic irregularity distally without significant stenosis. The bifurcation is unremarkable. The cervical right ICA demonstrates a focal 50% stenosis in the mid cervical ICA. The distal ICA is unremarkable.  The left common carotid artery demonstrates more calcification distally without a significant stenosis. The bifurcation demonstrates no focal stenosis. The left cervical ICA is unremarkable otherwise.  Of note, there is significant medial deviation of the carotid arteries bilaterally focused at the level of the bifurcations.  And advanced spondylosis of the cervical spine is evident. There is chronic loss of disc height in kyphosis C4-5, C5-6, and C6-7. Degenerative grade 1/2 anterolisthesis at C3-4 measures 5 mm.  The lung apices demonstrate biapical scarring. Mild edema is suggested.  Review of the MIP images confirms the above findings.  IMPRESSION: CTA HEAD IMPRESSION  1. Atrophy and diffuse white matter disease without evidence for an acute infarct. 2. Atherosclerotic changes of the cavernous carotid arteries without significant stenoses.  CTA NECK IMPRESSION  1. Atherosclerotic irregularity within the distal common carotid arteries bilaterally with medial deviation. 2. Focal 50% stenosis of the mid right cervical ICA without other significant stenoses. 3. Mild ectasia of the dominant left vertebral artery.   Electronically Signed   By: Gennette Pac M.D.   On: 05/26/2013 20:12   Ct Head Wo Contrast  05/26/2013   CLINICAL DATA:   A CHF began at 1630 hr today, history of stroke  EXAM: CT HEAD WITHOUT CONTRAST  TECHNIQUE: Contiguous axial images were obtained from the base of the skull through the vertex without intravenous contrast.  COMPARISON:  05/24/2013  FINDINGS: There is no hemorrhage or extra-axial fluid. There is no evidence of mass or hydrocephalus. There is mild to moderate atrophy and there is moderate diffuse bilateral deep white matter low attenuation. Lacunar infarct left basal ganglia measuring about 1 cm, stable. No acute vascular territory infarct. Calvarium intact.  IMPRESSION: No acute findings. Stable involutional change and left lacunar infarct.   Electronically Signed   By: Esperanza Heir M.D.   On: 05/26/2013 18:05   Ct Head Wo Contrast  05/24/2013   CLINICAL DATA:  Altered mental status ; tingling in both upper extremities  EXAM: CT HEAD WITHOUT CONTRAST  TECHNIQUE: Contiguous axial images were obtained from the base of the skull through the vertex without intravenous contrast. Study was obtained within 24 hr of patient's arrival at the emergency department.  COMPARISON:  Brain CT June 09, 2008 and brain MRI October 20, 2008  FINDINGS: There is mild diffuse atrophy. There is no mass, hemorrhage, extra-axial fluid collection, or midline shift. There is small vessel disease throughout the centra semiovale bilaterally. There is evidence of a prior small infarct in the left external capsule. There is no acute appearing infarct.  Bony calvarium appears intact. The mastoid air cells are clear.  IMPRESSION: Atrophy with small vessel disease. Prior infarct left external capsule. No intracranial mass, hemorrhage, or acute appearing infarct.   Electronically Signed   By: Bretta Bang M.D.   On: 05/24/2013 18:38    CBC  Recent Labs Lab 05/24/13 1645 05/26/13 1750 05/26/13 1758 05/28/13 0425  WBC 5.9 5.3  --  4.7  HGB 13.8 13.4 15.0 12.9  HCT 42.5 41.5 44.0 39.2  PLT 211 205  --  163  MCV 89.3 89.1  --   89.5  MCH 29.0 28.8  --  29.5  MCHC 32.5 32.3  --  32.9  RDW 14.1 14.2  --  14.3  LYMPHSABS 1.2 1.6  --   --  MONOABS 0.6 0.6  --   --   EOSABS 0.2 0.2  --   --   BASOSABS 0.0 0.0  --   --     Chemistries   Recent Labs Lab 05/24/13 1645 05/26/13 1750 05/26/13 1758 05/28/13 0425  NA 135 139 141 140  K 3.9 4.1 4.1 4.0  CL 100 102 103 104  CO2 27 26  --  26  GLUCOSE 129* 103* 99 112*  BUN 13 16 18 14   CREATININE 1.17* 1.10 1.40* 1.10  CALCIUM 10.1 10.0  --  9.6  AST 33 34  --   --   ALT 33 32  --   --   ALKPHOS 78 79  --   --   BILITOT 0.4 0.5  --   --    ------------------------------------------------------------------------------------------------------------------ CrCl is unknown because both a height and weight (above a minimum accepted value) are required for this calculation. ------------------------------------------------------------------------------------------------------------------  Recent Labs  05/27/13 0425  HGBA1C 5.9*   ------------------------------------------------------------------------------------------------------------------  Recent Labs  05/27/13 0425  CHOL 163  HDL 31*  LDLCALC 97  TRIG 161*  CHOLHDL 5.3   ------------------------------------------------------------------------------------------------------------------ No results found for this basename: TSH, T4TOTAL, FREET3, T3FREE, THYROIDAB,  in the last 72 hours ------------------------------------------------------------------------------------------------------------------ No results found for this basename: VITAMINB12, FOLATE, FERRITIN, TIBC, IRON, RETICCTPCT,  in the last 72 hours  Coagulation profile  Recent Labs Lab 05/24/13 1800 05/26/13 1750  INR 1.47 1.43    No results found for this basename: DDIMER,  in the last 72 hours  Cardiac Enzymes  Recent Labs Lab 05/24/13 1800 05/26/13 1750  TROPONINI <0.30 <0.30    ------------------------------------------------------------------------------------------------------------------ No components found with this basename: POCBNP,      Time Spent in minutes  35   Accalia Rigdon K M.D on 05/28/2013 at 10:45 AM  Between 7am to 7pm - Pager - [phone]  After 7pm go to [email or web address] - password [email or web address]  And look for the night coverage person covering for me after hours  [facility] Office  [phone]

## 2013-05-28 NOTE — Progress Notes (Signed)
Stroke Team Progress Note  HISTORY [name] is an 79 y.o. female with a past medical history significant for HTN, hyperlipidemia, TIA, stroke without residual deficits, s/p bilateral CEA several years ago, atrial fibrillation currently on Pradaxa, PVD, PUD, depression/anxiety, initially seen at [facility] ED and then transferred to [facility] ED for further evaluation of acute dysphasia.  She was reportedly seen in the ED on 10/22 due to difficulty speaking but got better and decided not to stay in the hospital for further testing.  On 10/24, around 430 pm, she was talking to her daughter on the phone when suddenly realized that she was having difficulty expressing herself although remarks that she was able to comprehend well and was not really confused.  She was told by her daughter that she was " getting words out ok but in the wrong order". Episode lasted at least 2 hours.  Denies associated HA, vertigo, double vision, difficulty swallowing, imbalance, focal weakness-numbness, or visual impairment.  Her language rapidly improved and thus was not considered a proper candidate for thrombolysis.  CT/CTA brain and neck are unimpressive.  Presently, she feels back to her baseline.  Patient was not a TPA candidate secondary to resolution of symptoms. She was admitted to the 3W for further evaluation and treatment.  SUBJECTIVE Resting comfortably with husband at bed side. Reports no acute symptoms, feels she is back to her baseline.   OBJECTIVE Most recent Vital Signs: Filed Vitals:   05/28/13 0356 05/28/13 0803 05/28/13 0805 05/28/13 0807  BP: 116/74 90/71 77/46  77/48  Pulse: 82 83 88 95  Temp: 98.6 F (37 C)   98.4 F (36.9 C)  TempSrc:    Oral  Resp: 18     SpO2: 98%      CBG (last 3)   Recent Labs  05/27/13 1629 05/27/13 2002 05/28/13 0726  GLUCAP 102* 127* 122*    IV Fluid Intake:   . clotrimazole      MEDICATIONS  . bisoprolol  5 mg Oral Q1200  . cefUROXime  500 mg Oral BID   . dabigatran  150 mg Oral Q12H  . midodrine  2.5 mg Oral TID WC  . multivitamin with minerals  1 tablet Oral Daily   PRN:  clotrimazole  Diet:  Cardiac  Activity:  Bedrest DVT Prophylaxis:  pradaxa  CLINICALLY SIGNIFICANT STUDIES Basic Metabolic Panel:   Recent Labs Lab 05/26/13 1750 05/26/13 1758 05/28/13 0425  NA 139 141 140  K 4.1 4.1 4.0  CL 102 103 104  CO2 26  --  26  GLUCOSE 103* 99 112*  BUN 16 18 14   CREATININE 1.10 1.40* 1.10  CALCIUM 10.0  --  9.6   Liver Function Tests:   Recent Labs Lab 05/24/13 1645 05/26/13 1750  AST 33 34  ALT 33 32  ALKPHOS 78 79  BILITOT 0.4 0.5  PROT 7.3 7.5  ALBUMIN 3.9 4.1   CBC:   Recent Labs Lab 05/24/13 1645 05/26/13 1750 05/26/13 1758 05/28/13 0425  WBC 5.9 5.3  --  4.7  NEUTROABS 4.0 2.9  --   --   HGB 13.8 13.4 15.0 12.9  HCT 42.5 41.5 44.0 39.2  MCV 89.3 89.1  --  89.5  PLT 211 205  --  163   Coagulation:   Recent Labs Lab 05/24/13 1800 05/26/13 1750  LABPROT 17.4* 17.1*  INR 1.47 1.43   Cardiac Enzymes:   Recent Labs Lab 05/24/13 1800 05/26/13 1750  TROPONINI <0.30 <0.30  Urinalysis:   Recent Labs Lab 05/26/13 1817 05/27/13 1230  COLORURINE YELLOW YELLOW  LABSPEC 1.014 1.024  PHURINE 6.5 7.0  GLUCOSEU NEGATIVE NEGATIVE  HGBUR NEGATIVE NEGATIVE  BILIRUBINUR NEGATIVE NEGATIVE  KETONESUR NEGATIVE NEGATIVE  PROTEINUR NEGATIVE NEGATIVE  UROBILINOGEN 0.2 0.2  NITRITE NEGATIVE NEGATIVE  LEUKOCYTESUR TRACE* NEGATIVE   Lipid Panel    Component Value Date/Time   CHOL 163 05/27/2013 0425   TRIG 176* 05/27/2013 0425   HDL 31* 05/27/2013 0425   CHOLHDL 5.3 05/27/2013 0425   VLDL 35 05/27/2013 0425   LDLCALC 97 05/27/2013 0425   HgbA1C  Lab Results  Component Value Date   HGBA1C 5.9* 05/27/2013    Urine Drug Screen:     Component Value Date/Time   LABOPIA NONE DETECTED 05/26/2013 1817   COCAINSCRNUR NONE DETECTED 05/26/2013 1817   LABBENZ NONE DETECTED 05/26/2013 1817    AMPHETMU NONE DETECTED 05/26/2013 1817   THCU NONE DETECTED 05/26/2013 1817   LABBARB NONE DETECTED 05/26/2013 1817    Alcohol Level:   Recent Labs Lab 05/26/13 1750  ETH <11    Ct Angio Head W/cm &/or Wo Cm  05/26/2013   CLINICAL DATA:  Stroke symptoms.  Aphasia beginning at 4:30 p.m.  EXAM: CT ANGIOGRAPHY HEAD AND NECK  TECHNIQUE: Multidetector CT imaging of the head and neck was performed using the standard protocol during bolus administration of intravenous contrast. Multiplanar CT image reconstructions including MIPs were obtained to evaluate the vascular anatomy. Carotid stenosis measurements (when applicable) are obtained utilizing NASCET criteria, using the distal internal carotid diameter as the denominator.  CONTRAST:  80mL OMNIPAQUE IOHEXOL 350 MG/ML SOLN  COMPARISON:  CT head without contrast 05/26/2013.  FINDINGS: CTA HEAD FINDINGS  Atrophy and diffuse white matter disease the similar to the prior exams. No pathologic enhancement is evident. The basal ganglia are intact. The source images demonstrate normal gray-white differentiation. Ventricles are of normal size. No significant extra-axial fluid collection is present.  Dense atherosclerotic calcifications are present within the cavernous carotid arteries bilaterally without significant stenoses. The ICA termini are within normal limits bilaterally. The A1 and M1 segments are within normal limits. The MCA bifurcations are unremarkable. There is some attenuation of MCA branch vessels bilaterally. The basilar artery is within normal limits. Both posterior cerebral arteries originate from the basilar tip. The PCA branch vessels are within normal limits.  The dural sinuses are patent. The right transverse sinus is dominant.  Review of the MIP images confirms the above findings.  CTA NECK FINDINGS  A standard 3 vessel arch configuration is present. Atherosclerotic calcifications are present in the aortic arch without significant stenosis of  the great vessel origins.  The vertebral arteries both originate from the subclavian arteries. The left vertebral artery is the dominant vessel. There is no significant stenosis of the vertebral arteries in the neck. The right vertebral artery essentially terminates at the PICA ascending only a very small branch to the vertebrobasilar junction. There is some ectasia of the left vertebral artery.  The right common carotid artery demonstrates some atherosclerotic irregularity distally without significant stenosis. The bifurcation is unremarkable. The cervical right ICA demonstrates a focal 50% stenosis in the mid cervical ICA. The distal ICA is unremarkable.  The left common carotid artery demonstrates more calcification distally without a significant stenosis. The bifurcation demonstrates no focal stenosis. The left cervical ICA is unremarkable otherwise.  Of note, there is significant medial deviation of the carotid arteries bilaterally focused at the level of the bifurcations.  And advanced spondylosis of the cervical spine is evident. There is chronic loss of disc height in kyphosis C4-5, C5-6, and C6-7. Degenerative grade 1/2 anterolisthesis at C3-4 measures 5 mm.  The lung apices demonstrate biapical scarring. Mild edema is suggested.  Review of the MIP images confirms the above findings.  IMPRESSION: CTA HEAD IMPRESSION  1. Atrophy and diffuse white matter disease without evidence for an acute infarct. 2. Atherosclerotic changes of the cavernous carotid arteries without significant stenoses.  CTA NECK IMPRESSION  1. Atherosclerotic irregularity within the distal common carotid arteries bilaterally with medial deviation. 2. Focal 50% stenosis of the mid right cervical ICA without other significant stenoses. 3. Mild ectasia of the dominant left vertebral artery.   Electronically Signed   By: Gennette Pac M.D.   On: 05/26/2013 20:12   Ct Head Wo Contrast  05/26/2013   CLINICAL DATA:  A CHF began at 1630 hr  today, history of stroke  EXAM: CT HEAD WITHOUT CONTRAST  TECHNIQUE: Contiguous axial images were obtained from the base of the skull through the vertex without intravenous contrast.  COMPARISON:  05/24/2013  FINDINGS: There is no hemorrhage or extra-axial fluid. There is no evidence of mass or hydrocephalus. There is mild to moderate atrophy and there is moderate diffuse bilateral deep white matter low attenuation. Lacunar infarct left basal ganglia measuring about 1 cm, stable. No acute vascular territory infarct. Calvarium intact.  IMPRESSION: No acute findings. Stable involutional change and left lacunar infarct.   Electronically Signed   By: Esperanza Heir M.D.   On: 05/26/2013 18:05   Mr Brain Wo Contrast  05/27/2013   *RADIOLOGY REPORT*  Clinical Data: Aphasic.  Stroke.  MRI HEAD WITHOUT CONTRAST  Technique:  Multiplanar, multiecho pulse sequences of the brain and surrounding structures were obtained according to standard protocol without intravenous contrast.  Comparison: CTA head without contrast 05/26/2013  Findings: The diffusion weighted images demonstrate no acute non hemorrhagic infarct within the left lentiform nucleus and corona radiata.  No hemorrhage or mass lesion is present.  The ventricles are proportionate to the degree of atrophy.  Diffuse remote white matter disease is present.  Moderate generalized atrophy is evident.  Remote lacunar infarcts dilated perivascular spaces are present bilaterally.  Flow is present in the major intracranial arteries.  IMPRESSION:  1.  Acute non hemorrhagic infarct of the left lentiform nucleus and corona radiata measures 13 mm maximally. 2.  Atrophy and diffuse white matter disease compatible with chronic microvascular ischemia.   Original Report Authenticated By: Marin Roberts, M.D.    CT of the brain   10/24: CTA head and neck CTA HEAD IMPRESSION  1. Atrophy and diffuse white matter disease without evidence for an  acute infarct.  2.  Atherosclerotic changes of the cavernous carotid arteries without  significant stenoses.  CTA NECK IMPRESSION  1. Atherosclerotic irregularity within the distal common carotid  arteries bilaterally with medial deviation.  2. Focal 50% stenosis of the mid right cervical ICA without other  significant stenoses.  3. Mild ectasia of the dominant left vertebral artery.   10/24 CT head There is no hemorrhage or extra-axial fluid. There is no evidence of  mass or hydrocephalus. There is mild to moderate atrophy and there  is moderate diffuse bilateral deep white matter low attenuation.  Lacunar infarct left basal ganglia measuring about 1 cm, stable. No  acute vascular territory infarct. Calvarium intact.  MRI of the brain   1. Acute non hemorrhagic infarct of the  left lentiform nucleus and  corona radiata measures 13 mm maximally.  2. Atrophy and diffuse white matter disease compatible with  chronic microvascular ischemia.   MRA of the brain    2D Echocardiogram    Carotid Doppler    CXR    Therapy Recommendations pending  Physical Exam   Gen:NAD, pleasant CV: RRR, no m/r/g, no carotid bruits Pulm: CTA bilat  Mental Status:  Alert, awake, oriented x 4, thought content appropriate. Speech fluent without evidence of aphasia. Able to correctly name wristwatch, watch crystal, ball point pen. Able to follow 3 step commands without difficulty.  Cranial Nerves:  II: pupils equal, round, reactive to light III,IV, VI: ptosis not present, extra-ocular motions intact bilaterally  V,VII: smile symmetric, facial light touch sensation normal bilaterally  VIII: hearing normal bilaterally  XI: bilateral shoulder shrug  XII: midline tongue extension without atrophy or fasciculations  Motor:  Right : strength 5/5 in all extremities, no drift noted Tone and bulk:normal tone throughout; no atrophy noted  Sensory:  light touch intact throughout, bilaterally  Deep Tendon Reflexes:  1+ all over   Plantars:  Right: downgoing Left: downgoing  Cerebellar:  normal finger-to-nose Gait:  No tested   ASSESSMENT [name] is a 79 y.o. female presenting with expressive aphasia. Did not receive t-PA due to rapidly improving symptoms. Concern for embolic event.  On Pradaxa  prior to admission. Now on Pradaxa for secondary stroke prevention. Work up underway.   TIA  A fib  HLD  HTN  Hospital day # 2  TREATMENT/PLAN  Will discontinue Pradaxa and switch to Eliquis 2.5mg  BID  2D echo results pending  Risk factor modifications  Rehab evaluation  Follow up with Dr Pearlean Brownie at [facility] in 6 weeks  Elspeth Cho, DO Neurology-Stroke

## 2013-05-29 LAB — GLUCOSE, CAPILLARY

## 2013-05-29 MED ORDER — MIDODRINE HCL 5 MG PO TABS: 5.0000 mg | ORAL_TABLET | Freq: Three times a day (TID) | ORAL | Status: DC

## 2013-05-29 MED ORDER — APIXABAN 2.5 MG PO TABS: 2.5000 mg | ORAL_TABLET | Freq: Two times a day (BID) | ORAL | Status: DC

## 2013-05-29 NOTE — Progress Notes (Signed)
OT Cancellation Note  Patient Details Name: [name] MRN: [id number] DOB: [birth year]   Cancelled Treatment:     Note, pt not seen for acute OT assessment secondary to being d/c home w/ husband today. D/c orders in chart, will sign off at this time.  Roselie Awkward Dixon 05/29/2013, 11:38 AM

## 2013-05-29 NOTE — Progress Notes (Signed)
Stroke Team Progress Note  HISTORY [name] is an 79 y.o. female with a past medical history significant for HTN, hyperlipidemia, TIA, stroke without residual deficits, s/p bilateral CEA several years ago, atrial fibrillation currently on Pradaxa, PVD, PUD, depression/anxiety, initially seen at [facility] ED and then transferred to [facility] ED for further evaluation of acute dysphasia. She was reportedly seen in the ED on 10/22 due to difficulty speaking but got better and decided not to stay in the hospital for further testing.   On 10/24, around 430 pm, she was talking to her daughter on the phone when suddenly realized that she was having difficulty expressing herself although remarks that she was able to comprehend well and was not really confused.  She was told by her daughter that she was " getting words out ok but in the wrong order". Episode lasted at least 2 hours. Denies associated HA, vertigo, double vision, difficulty swallowing, imbalance, focal weakness-numbness, or visual impairment. Her language rapidly improved and thus was not considered a proper candidate for thrombolysis. CT/CTA brain and neck are unimpressive.  Presently, she feels back to her baseline.  Patient was not a TPA candidate secondary to resolution of symptoms. She was admitted to the 3W for further evaluation and treatment.  SUBJECTIVE Pt up in chair at the bedside. Husband also at the bedside.  OBJECTIVE Most recent Vital Signs: Filed Vitals:   05/29/13 0430 05/29/13 0431 05/29/13 0432 05/29/13 0800  BP: 137/70 133/76 109/54 122/77  Pulse: 75 84 87 76  Temp: 99.1 F (37.3 C)   98.2 F (36.8 C)  TempSrc: Oral   Oral  Resp: 16   18  SpO2: 91%   96%   CBG (last 3)   Recent Labs  05/28/13 1544 05/28/13 2033 05/29/13 0748  GLUCAP 115* 101* 95    IV Fluid Intake:   . sodium chloride Stopped (05/29/13 0015)  . clotrimazole      MEDICATIONS  . ALPRAZolam  0.5 mg Oral Once  . apixaban  2.5 mg Oral BID   . bisoprolol  5 mg Oral Q1200  . cefUROXime  500 mg Oral BID  . midodrine  5 mg Oral TID WC  . multivitamin with minerals  1 tablet Oral Daily   PRN:  clotrimazole  Diet:  Cardiac thin liquids Activity:  Bedrest DVT Prophylaxis:  pradaxa  CLINICALLY SIGNIFICANT STUDIES Basic Metabolic Panel:   Recent Labs Lab 05/26/13 1750 05/26/13 1758 05/28/13 0425  NA 139 141 140  K 4.1 4.1 4.0  CL 102 103 104  CO2 26  --  26  GLUCOSE 103* 99 112*  BUN 16 18 14   CREATININE 1.10 1.40* 1.10  CALCIUM 10.0  --  9.6   Liver Function Tests:   Recent Labs Lab 05/24/13 1645 05/26/13 1750  AST 33 34  ALT 33 32  ALKPHOS 78 79  BILITOT 0.4 0.5  PROT 7.3 7.5  ALBUMIN 3.9 4.1   CBC:   Recent Labs Lab 05/24/13 1645 05/26/13 1750 05/26/13 1758 05/28/13 0425  WBC 5.9 5.3  --  4.7  NEUTROABS 4.0 2.9  --   --   HGB 13.8 13.4 15.0 12.9  HCT 42.5 41.5 44.0 39.2  MCV 89.3 89.1  --  89.5  PLT 211 205  --  163   Coagulation:   Recent Labs Lab 05/24/13 1800 05/26/13 1750  LABPROT 17.4* 17.1*  INR 1.47 1.43   Cardiac Enzymes:   Recent Labs Lab 05/24/13 1800 05/26/13 1750  TROPONINI <0.30 <0.30   Urinalysis:   Recent Labs Lab 05/26/13 1817 05/27/13 1230  COLORURINE YELLOW YELLOW  LABSPEC 1.014 1.024  PHURINE 6.5 7.0  GLUCOSEU NEGATIVE NEGATIVE  HGBUR NEGATIVE NEGATIVE  BILIRUBINUR NEGATIVE NEGATIVE  KETONESUR NEGATIVE NEGATIVE  PROTEINUR NEGATIVE NEGATIVE  UROBILINOGEN 0.2 0.2  NITRITE NEGATIVE NEGATIVE  LEUKOCYTESUR TRACE* NEGATIVE   Lipid Panel    Component Value Date/Time   CHOL 163 05/27/2013 0425   TRIG 176* 05/27/2013 0425   HDL 31* 05/27/2013 0425   CHOLHDL 5.3 05/27/2013 0425   VLDL 35 05/27/2013 0425   LDLCALC 97 05/27/2013 0425   HgbA1C  Lab Results  Component Value Date   HGBA1C 5.9* 05/27/2013    Urine Drug Screen:     Component Value Date/Time   LABOPIA NONE DETECTED 05/26/2013 1817   COCAINSCRNUR NONE DETECTED 05/26/2013 1817    LABBENZ NONE DETECTED 05/26/2013 1817   AMPHETMU NONE DETECTED 05/26/2013 1817   THCU NONE DETECTED 05/26/2013 1817   LABBARB NONE DETECTED 05/26/2013 1817    Alcohol Level:   Recent Labs Lab 05/26/13 1750  ETH <11    CT Head 05/26/13  There is no hemorrhage or extra-axial fluid. There is no evidence of mass or hydrocephalus. There is mild to moderate atrophy and there is moderate diffuse bilateral deep white matter low attenuation. Lacunar infarct left basal ganglia measuring about 1 cm, stable. No acute vascular territory infarct. Calvarium intact.  CTA of the brain  05/26/13  1. Atrophy and diffuse white matter disease without evidence for an  acute infarct. 2. Atherosclerotic changes of the cavernous carotid arteries without significant stenoses.   CTA of the neck  05/26/13  1. Atherosclerotic irregularity within the distal common carotid arteries bilaterally with medial deviation. 2. Focal 50% stenosis of the mid right cervical ICA without other significant stenoses.  3. Mild ectasia of the dominant left vertebral artery.   MRI of the brain  05/27/2013    1.  Acute non hemorrhagic infarct of the left lentiform nucleus and corona radiata measures 13 mm maximally. 2.  Atrophy and diffuse white matter disease compatible with chronic microvascular ischemia.    MRA of the brain  See CTA head  2D Echocardiogram  EF 50-55% with no source of embolus.   Carotid Doppler  See CTA neck  CXR    Therapy Recommendations refused PT on Sun, Mon PT eval feels she is at baseline and ok for discharge, no therapy needs  Physical Exam   Gen:NAD, pleasant CV: RRR, no m/r/g, no carotid bruits Pulm: CTA bilat  Mental Status:  Alert, awake, oriented x 4, thought content appropriate. Speech fluent without evidence of aphasia. Able to correctly name wristwatch, watch crystal, ball point pen. Able to follow 3 step commands without difficulty.  Cranial Nerves:  II: pupils equal, round, reactive to  light III,IV, VI: ptosis not present, extra-ocular motions intact bilaterally  V,VII: smile symmetric, facial light touch sensation normal bilaterally  VIII: hearing normal bilaterally  XI: bilateral shoulder shrug  XII: midline tongue extension without atrophy or fasciculations  Motor:  Right : strength 5/5 in all extremities, no drift noted Tone and bulk:normal tone throughout; no atrophy noted  Sensory:  light touch intact throughout, bilaterally  Deep Tendon Reflexes:  1+ all over  Plantars:  Right: downgoing Left: downgoing  Cerebellar:  normal finger-to-nose Gait:  No tested   ASSESSMENT [name] is a 79 y.o. female presenting with expressive aphasia. Did not receive t-PA  due to rapidly improving symptoms. MRI confirms  left lentiform nucleus and corona radiata ischemic infarct. Infarct felt to be embolic due to known atrial fibrillation. On Pradaxa  prior to admission. Changed to Eliquis for secondary stroke prevention at pt request (no data to support the benefit of change from stroke recurrence standpoint).   Orthostatic hypotension  TIA  A fib on pradaxa prior to admission Hyperlipidemia per hx, LDL 97, on no lipid lowering medication PTA, now on no statin, goal LDL < 100 (< 70 for diabetics)  HTN  PVD  Hx stroke March 2004  Hx TIAs  Family hx ot stroke (mother)  ? Baseline mild dementia based on interaction today - she does not remember refusing PT yesterday, which she did twice, she covers the memory deficit with joking  Hospital day # 3  TREATMENT/PLAN  Continue Eliquis 2.5mg  BID for secondary stroke prevention No further stroke workup indicated. Patient has a 10-15% risk of having another stroke over the next year, the highest risk is within 2 weeks of the most recent stroke/TIA (risk of having a stroke following a stroke or TIA is the same). Ongoing risk factor control by Primary Care Physician Stroke Service will sign off. Please call  should any needs arise. Follow up with Dr. Pearlean Brownie, Stroke Clinic, in 2 months.   Annie Main, MSN, RN, ANVP-BC, ANP-BC, [facility] Pager: [phone] 05/29/2013 9:20 AM  I have personally obtained a history, examined the patient, evaluated imaging results, and formulated the assessment and plan of care. I agree with the above. Delia Heady, MD

## 2013-05-29 NOTE — Care Management Note (Signed)
    Page 1 of 1   05/29/2013     10:49:39 AM   CARE MANAGEMENT NOTE 05/29/2013  Patient:  [name], [name]   Account Number:  [id number]  Date Initiated:  05/29/2013  Documentation initiated by:  GRAVES-BIGELOW,Shandreka Dante  Subjective/Objective Assessment:   Pt admitted with stroke. Pt is from home with husband and plans to be d/c today.CM did fax resumption orders to [facility]. Pt wanted to return there.     Action/Plan:   CM did call [facility] on [address] and medication is available. Co pay will be 139.27 and pt/husband aware. Cm did provide pt with 30 day free card and husband to activate before pick up at pharmacy. No further needs from CM.   Anticipated DC Date:  05/29/2013   Anticipated DC Plan:  HOME/SELF CARE      DC Planning Services  CM consult      Choice offered to / List presented to:             Status of service:  Completed, signed off Medicare Important Message given?   (If response is "NO", the following Medicare IM given date fields will be blank) Date Medicare IM given:   Date Additional Medicare IM given:    Discharge Disposition:  HOME/SELF CARE  Per UR Regulation:  Reviewed for med. necessity/level of care/duration of stay  If discussed at Long Length of Stay Meetings, dates discussed:    Comments:

## 2013-05-29 NOTE — Discharge Summary (Signed)
[facility]                                                                                   [name], is a 79 y.o. female  DOB 06/01/1928  MRN [id number].  Admission date:  05/26/2013  Admitting Physician  Haydee Monica, MD  Discharge Date:  05/29/2013   Primary MD  [name], MD  Recommendations for primary care physician for things to follow:   Outpatient risk factor modulation for CVA   Admission Diagnosis  Aphasia [784.3]  Discharge Diagnosis   CVA  Principal Problem:   Aphasia Active Problems:   HYPERLIPIDEMIA   HYPERTENSION   Atrial fibrillation   PERIPHERAL VASCULAR DISEASE   COPD   Large B-cell lymphoma      Past Medical History  Diagnosis Date  . GERD (gastroesophageal reflux disease)   . History of TIAs   . Diverticulosis   . PUD (peptic ulcer disease)   . PVD (peripheral vascular disease)   . Hyperlipidemia   . Anxiety   . Depression   . Blood transfusion     "w/1st miscarriage" (05/26/2013)  . COPD (chronic obstructive pulmonary disease)   . Hypertension   . PONV (postoperative nausea and vomiting)   . Family history of anesthesia complication     "sisters also get sick as a dog" (05/26/2013)  . Coronary artery disease   . Heart murmur     "when I was a child" (05/26/2013)  . DVT (deep venous thrombosis) 1954    "after I had my first child" (05/26/2013)  . Pneumonia 2013    "once" (05/26/2013)  . Exertional shortness of breath   . H/O hiatal hernia   . Stroke 10/2002    no residual  . Stroke 05/26/2013    "very mild; affected my speech for a while" (05/26/2013)  . Arthritis     "lots; hands" (05/26/2013)  . 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 Lymphona, mantle cell, inguinal region/lower limb     "large c-cell" (05/26/2013)    Past Surgical History  Procedure Laterality Date  . Diverting ileostomy  09/22/2008    iliostomy for diverticulitis/notes 09/22/2008  (05/26/2013)  . Tubal ligation  1972  . Radical neck dissection  1986    "larynx cancer" (05/26/2013)  . Total knee arthroplasty Bilateral     2002 left 2008 right  . Ileostomy closure  01/2010  . Cataract extraction  2005    bilateral  . Carotid endarterectomy Bilateral 2004    2004 a month apart right and left  . Tonsillectomy and adenoidectomy  1936    "tonsils grew back; no 2nd OR" (05/26/2013)  . Hernia repair  05/2010    ventral hernia repair  . Carpal tunnel release Left ?1960's  . Abdominal exploration surgery  12/01/2012  . Appendectomy  09/13/2008  . Colon surgery  09/13/2008    Laparoscopic assisted converted to open sigmoid colectomy.Hattie Perch 09/13/2008 (05/26/2013)  . Cataract extraction w/ intraocular lens  implant, bilateral Bilateral ~ 2003  . Dilation and curettage of uterus  1950's    "had 2 for miscarriages" (  05/26/2013)     Discharge Condition: Stable   Follow-up Information   Follow up with [name], MD. Schedule an appointment as soon as possible for a visit in 3 days.   Specialty:  Internal Medicine   Contact information:   [address] [place] [address] [phone]       Follow up with Gates Rigg, MD. Schedule an appointment as soon as possible for a visit in 1 week. (and your Heart Doctor in 1 week)    Specialties:  Neurology, Radiology   Contact information:   [address] [place] [address] [phone]         Consults obtained - Neuro   Discharge Medications      Medication List    STOP taking these medications       dabigatran 150 MG Caps capsule  Commonly known as:  PRADAXA      TAKE these medications       ALPRAZolam 0.5 MG tablet  Commonly known as:  XANAX  Take 1 tablet (0.5 mg total) by mouth at bedtime as needed for sleep.     apixaban 2.5 MG Tabs tablet  Commonly known as:  ELIQUIS  Take 1 tablet (2.5 mg total) by mouth 2 (two) times daily.     bisoprolol 5 MG  tablet  Commonly known as:  ZEBETA  Take 5 mg by mouth daily at 12 noon.     cefUROXime 500 MG tablet  Commonly known as:  CEFTIN  Take 1 tablet (500 mg total) by mouth 2 (two) times daily.     clotrimazole-betamethasone lotion  Commonly known as:  LOTRISONE  APPLY TOPICALLY TWICE DAILY.     diclofenac sodium 1 % Gel  Commonly known as:  VOLTAREN  Apply 2 g topically 4 (four) times daily.     midodrine 5 MG tablet  Commonly known as:  PROAMATINE  Take 1 tablet (5 mg total) by mouth 3 (three) times daily with meals.     multivitamin with minerals Tabs tablet  Take 1 tablet by mouth daily.     VITAMIN B-12 PO  Take 1 tablet by mouth daily.     Vitamin D (Ergocalciferol) 50000 UNITS Caps capsule  Commonly known as:  DRISDOL  Take 50,000 Units by mouth every Sunday.         Diet and Activity recommendation: See Discharge Instructions below   Discharge Instructions      Follow with Primary MD [name], MD in 3 days   Get CBC, CMP, checked 3 days by Primary MD and again as instructed by your Primary MD.    Get Medicines reviewed and adjusted.  Please request your Prim.MD to go over all Hospital Tests and Procedure/Radiological results at the follow up, please get all Hospital records sent to your Prim MD by signing hospital release before you go home.  Activity: As tolerated with Full fall precautions use walker/cane & assistance as needed   Diet:  Heart healthy  For Heart failure patients - Check your Weight same time everyday, if you gain over 2 pounds, or you develop in leg swelling, experience more shortness of breath or chest pain, call your Primary MD immediately. Follow Cardiac Low Salt Diet and 1.8 lit/day fluid restriction.  Disposition Home   If you experience worsening of your admission symptoms, develop shortness of breath, life threatening emergency, suicidal or homicidal thoughts you must seek medical attention immediately by calling 911 or  calling your  MD immediately  if symptoms less severe.  You Must read complete instructions/literature along with all the possible adverse reactions/side effects for all the Medicines you take and that have been prescribed to you. Take any new Medicines after you have completely understood and accpet all the possible adverse reactions/side effects.   Do not drive and provide baby sitting services if your were admitted for syncope or siezures until you have seen by Primary MD or a Neurologist and advised to do so again.  Do not drive when taking Pain medications.    Do not take more than prescribed Pain, Sleep and Anxiety Medications  Special Instructions: If you have smoked or chewed Tobacco  in the last 2 yrs please stop smoking, stop any regular Alcohol  and or any Recreational drug use.  Wear Seat belts while driving.   Please note  You were cared for by a hospitalist during your hospital stay. If you have any questions about your discharge medications or the care you received while you were in the hospital after you are discharged, you can call the unit and asked to speak with the hospitalist on call if the hospitalist that took care of you is not available. Once you are discharged, your primary care physician will handle any further medical issues. Please note that NO REFILLS for any discharge medications will be authorized once you are discharged, as it is imperative that you return to your primary care physician (or establish a relationship with a primary care physician if you do not have one) for your aftercare needs so that they can reassess your need for medications and monitor your lab values.    Major procedures and Radiology Reports - PLEASE review detailed and final reports for all details, in brief -   Echo  - Left ventricle: Wall thickness was increased in a pattern of moderate LVH. Systolic function was normal. The estimated ejection fraction was in the range of 50% to 55%.  Features are consistent with a pseudonormal left ventricular filling pattern, with concomitant abnormal relaxation and increased filling pressure (grade 2 diastolic dysfunction). Doppler parameters are consistent with high ventricular filling pressure. - Aortic valve: Valve area: 1.91cm^2(VTI). Valve area: 1.91cm^2 (Vmax). - Mitral valve: Calcified annulus. Mild to moderate regurgitation. - Pulmonary arteries: PA peak pressure: 34mm Hg (S).  Impressions:  No cardiac source of embolism was identified, but cannot be ruled out on the basis of this examination.     Carotid Duplex  Preliminary report: There is 1-39% ICA stenosis. Vertebral artery flow is antegrade.     Ct Angio Head W/cm &/or Wo Cm  05/26/2013   CLINICAL DATA:  Stroke symptoms.  Aphasia beginning at 4:30 p.m.  EXAM: CT ANGIOGRAPHY HEAD AND NECK  TECHNIQUE: Multidetector CT imaging of the head and neck was performed using the standard protocol during bolus administration of intravenous contrast. Multiplanar CT image reconstructions including MIPs were obtained to evaluate the vascular anatomy. Carotid stenosis measurements (when applicable) are obtained utilizing NASCET criteria, using the distal internal carotid diameter as the denominator.  CONTRAST:  80mL OMNIPAQUE IOHEXOL 350 MG/ML SOLN  COMPARISON:  CT head without contrast 05/26/2013.  FINDINGS: CTA HEAD FINDINGS  Atrophy and diffuse white matter disease the similar to the prior exams. No pathologic enhancement is evident. The basal ganglia are intact. The source images demonstrate normal gray-white differentiation. Ventricles are of normal size. No significant extra-axial fluid collection is present.  Dense atherosclerotic calcifications are present within the cavernous carotid arteries bilaterally  without significant stenoses. The ICA termini are within normal limits bilaterally. The A1 and M1 segments are within normal limits. The MCA bifurcations are unremarkable. There is  some attenuation of MCA branch vessels bilaterally. The basilar artery is within normal limits. Both posterior cerebral arteries originate from the basilar tip. The PCA branch vessels are within normal limits.  The dural sinuses are patent. The right transverse sinus is dominant.  Review of the MIP images confirms the above findings.  CTA NECK FINDINGS  A standard 3 vessel arch configuration is present. Atherosclerotic calcifications are present in the aortic arch without significant stenosis of the great vessel origins.  The vertebral arteries both originate from the subclavian arteries. The left vertebral artery is the dominant vessel. There is no significant stenosis of the vertebral arteries in the neck. The right vertebral artery essentially terminates at the PICA ascending only a very small branch to the vertebrobasilar junction. There is some ectasia of the left vertebral artery.  The right common carotid artery demonstrates some atherosclerotic irregularity distally without significant stenosis. The bifurcation is unremarkable. The cervical right ICA demonstrates a focal 50% stenosis in the mid cervical ICA. The distal ICA is unremarkable.  The left common carotid artery demonstrates more calcification distally without a significant stenosis. The bifurcation demonstrates no focal stenosis. The left cervical ICA is unremarkable otherwise.  Of note, there is significant medial deviation of the carotid arteries bilaterally focused at the level of the bifurcations.  And advanced spondylosis of the cervical spine is evident. There is chronic loss of disc height in kyphosis C4-5, C5-6, and C6-7. Degenerative grade 1/2 anterolisthesis at C3-4 measures 5 mm.  The lung apices demonstrate biapical scarring. Mild edema is suggested.  Review of the MIP images confirms the above findings.  IMPRESSION: CTA HEAD IMPRESSION  1. Atrophy and diffuse white matter disease without evidence for an acute infarct. 2.  Atherosclerotic changes of the cavernous carotid arteries without significant stenoses.  CTA NECK IMPRESSION  1. Atherosclerotic irregularity within the distal common carotid arteries bilaterally with medial deviation. 2. Focal 50% stenosis of the mid right cervical ICA without other significant stenoses. 3. Mild ectasia of the dominant left vertebral artery.   Electronically Signed   By: Gennette Pac M.D.   On: 05/26/2013 20:12   Ct Head Wo Contrast  05/26/2013   CLINICAL DATA:  A CHF began at 1630 hr today, history of stroke  EXAM: CT HEAD WITHOUT CONTRAST  TECHNIQUE: Contiguous axial images were obtained from the base of the skull through the vertex without intravenous contrast.  COMPARISON:  05/24/2013  FINDINGS: There is no hemorrhage or extra-axial fluid. There is no evidence of mass or hydrocephalus. There is mild to moderate atrophy and there is moderate diffuse bilateral deep white matter low attenuation. Lacunar infarct left basal ganglia measuring about 1 cm, stable. No acute vascular territory infarct. Calvarium intact.  IMPRESSION: No acute findings. Stable involutional change and left lacunar infarct.   Electronically Signed   By: Esperanza Heir M.D.   On: 05/26/2013 18:05   Ct Head Wo Contrast  05/24/2013   CLINICAL DATA:  Altered mental status ; tingling in both upper extremities  EXAM: CT HEAD WITHOUT CONTRAST  TECHNIQUE: Contiguous axial images were obtained from the base of the skull through the vertex without intravenous contrast. Study was obtained within 24 hr of patient's arrival at the emergency department.  COMPARISON:  Brain CT June 09, 2008 and brain MRI October 20, 2008  FINDINGS:  There is mild diffuse atrophy. There is no mass, hemorrhage, extra-axial fluid collection, or midline shift. There is small vessel disease throughout the centra semiovale bilaterally. There is evidence of a prior small infarct in the left external capsule. There is no acute appearing infarct.  Bony  calvarium appears intact. The mastoid air cells are clear.  IMPRESSION: Atrophy with small vessel disease. Prior infarct left external capsule. No intracranial mass, hemorrhage, or acute appearing infarct.   Electronically Signed   By: Bretta Bang M.D.   On: 05/24/2013 18:38   Mr Brain Wo Contrast  05/27/2013   *RADIOLOGY REPORT*  Clinical Data: Aphasic.  Stroke.  MRI HEAD WITHOUT CONTRAST  Technique:  Multiplanar, multiecho pulse sequences of the brain and surrounding structures were obtained according to standard protocol without intravenous contrast.  Comparison: CTA head without contrast 05/26/2013  Findings: The diffusion weighted images demonstrate no acute non hemorrhagic infarct within the left lentiform nucleus and corona radiata.  No hemorrhage or mass lesion is present.  The ventricles are proportionate to the degree of atrophy.  Diffuse remote white matter disease is present.  Moderate generalized atrophy is evident.  Remote lacunar infarcts dilated perivascular spaces are present bilaterally.  Flow is present in the major intracranial arteries.  IMPRESSION:  1.  Acute non hemorrhagic infarct of the left lentiform nucleus and corona radiata measures 13 mm maximally. 2.  Atrophy and diffuse white matter disease compatible with chronic microvascular ischemia.   Original Report Authenticated By: Marin Roberts, M.D.    Micro Results      Recent Results (from the past 240 hour(s))  URINE CULTURE     Status: None   Collection Time    05/19/13  5:22 PM      Result Value Range Status   Colony Count 9,000 COLONIES/ML   Final   Organism ID, Bacteria Insignificant Growth   Final     History of present illness and  Hospital Course:     Kindly see H&P for history of present illness and admission details, please review complete Labs, Consult reports and Test reports for all details in brief DAZARIA MACNEILL, is a 79 y.o. female, patient with history of  atrial fibrillation who was  on Pradaxa, chronic orthostatic hypotension, supine essential hypertension, COPD, PAD, lymphoma in the past presented to the hospital with episodes of expressive aphasia due to Acute non hemorrhagic infarct of the left lentiform nucleus and corona radiata. She was seen by neurology, PTOT and speech, her symptoms have completely resolved, neurology recommended that she be switched to Eliquis from Pradaxa which was done, she will be discharged on Eliquis close outpatient followup with neurology and primary care physician. Note her A1c below and LDL were acceptable    For her atrial fibrillation she will continue her low-dose beta blocker for rate control, anticoagulations which as above.   Her supine blood pressures have been fine however she has chronic history of orthostatic hypertension for several years, I have recommended that she wear TED stockings, use cane when walking, she's been given instructions to stand still for a few seconds after she gets up from bed, and then walk with fall precautions and supervision. Husband bedside understands. I will place her on low-dose midodrine to reduce the amount of orthostasis. She is mildly orthostatic this morning but much improved from what she came in with and completely asymptomatic from the same. She says that she has been orthostatic for several years. TSH was stable.  Today  Subjective:   [name] today has no headache,no chest abdominal pain,no new weakness tingling or numbness, feels much better wants to go home today.   Objective:   Blood pressure 109/54, pulse 87, temperature 99.1 F (37.3 C), temperature source Oral, resp. rate 16, SpO2 91.00%.   Intake/Output Summary (Last 24 hours) at 05/29/13 0902 Last data filed at 05/29/13 0015  Gross per 24 hour  Intake 1232.5 ml  Output      0 ml  Net 1232.5 ml    Exam Awake Alert, Oriented *3, No new F.N deficits, Normal affect [place].AT,PERRAL Supple Neck,No JVD, No cervical  lymphadenopathy appriciated.  Symmetrical Chest wall movement, Good air movement bilaterally, CTAB RRR,No Gallops,Rubs or new Murmurs, No Parasternal Heave +ve B.Sounds, Abd Soft, Non tender, No organomegaly appriciated, No rebound -guarding or rigidity. No Cyanosis, Clubbing or edema, No new Rash or bruise  Data Review   CBC w Diff: Lab Results  Component Value Date   WBC 4.7 05/28/2013   WBC 6.7 09/05/2012   HGB 12.9 05/28/2013   HGB 12.4 09/05/2012   HCT 39.2 05/28/2013   HCT 39.4 09/05/2012   PLT 163 05/28/2013   PLT 151 09/05/2012   LYMPHOPCT 29 05/26/2013   LYMPHOPCT 12.2* 09/05/2012   BANDSPCT 0 03/23/2012   MONOPCT 11 05/26/2013   MONOPCT 9.5 09/05/2012   EOSPCT 4 05/26/2013   EOSPCT 3.3 09/05/2012   BASOPCT 1 05/26/2013   BASOPCT 0.5 09/05/2012    CMP: Lab Results  Component Value Date   NA 140 05/28/2013   NA 139 07/06/2012   K 4.0 05/28/2013   K 4.4 07/06/2012   CL 104 05/28/2013   CL 105 07/06/2012   CO2 26 05/28/2013   CO2 21* 07/06/2012   BUN 14 05/28/2013   BUN 11.0 07/06/2012   CREATININE 1.10 05/28/2013   CREATININE 1.0 07/06/2012   PROT 7.5 05/26/2013   PROT 6.5 07/06/2012   ALBUMIN 4.1 05/26/2013   ALBUMIN 3.9 07/06/2012   BILITOT 0.5 05/26/2013   BILITOT 0.51 07/06/2012   ALKPHOS 79 05/26/2013   ALKPHOS 74 07/06/2012   AST 34 05/26/2013   AST 26 07/06/2012   ALT 32 05/26/2013   ALT 24 07/06/2012  . Lab Results  Component Value Date   HGBA1C 5.9* 05/27/2013    Lab Results  Component Value Date   CHOL 163 05/27/2013   HDL 31* 05/27/2013   LDLCALC 97 05/27/2013   LDLDIRECT 100.9 02/13/2010   TRIG 176* 05/27/2013   CHOLHDL 5.3 05/27/2013     Total Time in preparing paper work, data evaluation and todays exam - 35 minutes  Leroy Sea M.D on 05/29/2013 at 9:02 AM  [facility] Office  [phone]

## 2013-05-29 NOTE — Progress Notes (Signed)
Physical Therapy Treatment Patient Details Name: [name] MRN: [id number] DOB: Apr 13, 1928 Today's Date: 05/29/2013 Time: 0900-0909 PT Time Calculation (min): 9 min  PT Assessment / Plan / Recommendation  History of Present Illness Patient is an 79 yo female admitted with aphasia, HTN to f/o CVA/TIA.  Patient with h/o CVA with no deficits.   PT Comments   Pt making steady progress.    Follow Up Recommendations  Outpatient PT;Supervision - Intermittent (Pt already receiving OPPT.)     Does the patient have the potential to tolerate intense rehabilitation     Barriers to Discharge        Equipment Recommendations  None recommended by PT    Recommendations for Other Services    Frequency Min 4X/week   Progress towards PT Goals Progress towards PT goals: Progressing toward goals  Plan Current plan remains appropriate    Precautions / Restrictions Precautions Precautions: Fall   Pertinent Vitals/Pain No c/o's of pain.    Mobility  Transfers Sit to Stand: 6: Modified independent (Device/Increase time);With upper extremity assist;From bed Stand to Sit: 6: Modified independent (Device/Increase time);With upper extremity assist;To chair/3-in-1 Ambulation/Gait Ambulation/Gait Assistance: 5: Supervision Ambulation Distance (Feet): 250 Feet Assistive device: None Ambulation/Gait Assistance Details: Pt with slight unsteadiness but no loss of balance. Gait Pattern: Step-through pattern;Decreased stride length Modified Rankin (Stroke Patients Only) Pre-Morbid Rankin Score: No symptoms Modified Rankin: No significant disability    Exercises     PT Diagnosis:    PT Problem List:   PT Treatment Interventions:     PT Goals (current goals can now be found in the care plan section)    Visit Information  Last PT Received On: 05/29/13 Assistance Needed: +1 History of Present Illness: Patient is an 79 yo female admitted with aphasia, HTN to f/o CVA/TIA.  Patient with h/o  CVA with no deficits.    Subjective Data      Cognition  Cognition Arousal/Alertness: Awake/alert Behavior During Therapy: WFL for tasks assessed/performed Overall Cognitive Status: Within Functional Limits for tasks assessed (Pt became defensive when I mentioned she appeared steadier than on eval.  Pt denied any unsteadiness.)    Balance  Balance Balance Assessed: Yes Static Standing Balance Static Standing - Balance Support: No upper extremity supported Static Standing - Level of Assistance: 6: Modified independent (Device/Increase time)  End of Session PT - End of Session Activity Tolerance: Patient tolerated treatment well Patient left: in chair;with call bell/phone within reach;with family/visitor present Nurse Communication: Mobility status   GP     [facility] 05/29/2013, 9:41 AM  [facility] PT [phone]

## 2013-05-30 ENCOUNTER — Ambulatory Visit (INDEPENDENT_AMBULATORY_CARE_PROVIDER_SITE_OTHER): Payer: Medicare Other | Admitting: Family

## 2013-05-30 ENCOUNTER — Telehealth: Payer: Self-pay | Admitting: Internal Medicine

## 2013-05-30 ENCOUNTER — Encounter: Payer: Self-pay | Admitting: Family

## 2013-05-30 VITALS — BP 134/84 | HR 77 | Wt 192.0 lb

## 2013-05-30 DIAGNOSIS — I6350 Cerebral infarction due to unspecified occlusion or stenosis of unspecified cerebral artery: Secondary | ICD-10-CM

## 2013-05-30 DIAGNOSIS — I951 Orthostatic hypotension: Secondary | ICD-10-CM

## 2013-05-30 DIAGNOSIS — R4701 Aphasia: Secondary | ICD-10-CM

## 2013-05-30 DIAGNOSIS — F329 Major depressive disorder, single episode, unspecified: Secondary | ICD-10-CM

## 2013-05-30 DIAGNOSIS — I639 Cerebral infarction, unspecified: Secondary | ICD-10-CM

## 2013-05-30 MED ORDER — SOLIFENACIN SUCCINATE 5 MG PO TABS: 10.0000 mg | ORAL_TABLET | Freq: Every day | ORAL | Status: DC

## 2013-05-30 NOTE — Telephone Encounter (Signed)
Pt needs appt in 6 wk per NP. Where can I work pt in  At ?

## 2013-05-30 NOTE — Patient Instructions (Addendum)
1. Low cholesterol diet 2. Continue Eliquis 3. Proamatine  4. Resume VESIcare 5. See neurologist as scheduled 6. Call or schedule of one of her physical therapist 7. Low cholesterol diet 8. See Dr. Lovell Sheehan in 6 weeks.  Fat and Cholesterol Control Diet Cholesterol levels in your body are determined significantly by your diet. Cholesterol levels may also be related to heart disease. The following material helps to explain this relationship and discusses what you can do to help keep your heart healthy. Not all cholesterol is bad. Low-density lipoprotein (LDL) cholesterol is the "bad" cholesterol. It may cause fatty deposits to build up inside your arteries. High-density lipoprotein (HDL) cholesterol is "good." It helps to remove the "bad" LDL cholesterol from your blood. Cholesterol is a very important risk factor for heart disease. Other risk factors are high blood pressure, smoking, stress, heredity, and weight. The heart muscle gets its supply of blood through the coronary arteries. If your LDL cholesterol is high and your HDL cholesterol is low, you are at risk for having fatty deposits build up in your coronary arteries. This leaves less room through which blood can flow. Without sufficient blood and oxygen, the heart muscle cannot function properly and you may feel chest pains (angina pectoris). When a coronary artery closes up entirely, a part of the heart muscle may die causing a heart attack (myocardial infarction). CHECKING CHOLESTEROL When your caregiver sends your blood to a lab to be examined for cholesterol, a complete lipid (fat) profile may be done. With this test, the total amount of cholesterol and levels of LDL and HDL are determined. Triglycerides are a type of fat that circulates in the blood. They can also be used to determine heart disease risk. The list below describes what the numbers should be: Test: Total Cholesterol.  Less than 200 mg/dl. Test: LDL "bad cholesterol."  Less  than 100 mg/dl.  Less than 70 mg/dl if you are at very high risk of a heart attack or sudden cardiac death. Test: HDL "good cholesterol."  Greater than 50 mg/dl for women.  Greater than 40 mg/dl for men. Test: Triglycerides.  Less than 150 mg/dl. CONTROLLING CHOLESTEROL WITH DIET Although exercise and lifestyle factors are important, your diet is key. That is because certain foods are known to raise cholesterol and others to lower it. The goal is to balance foods for their effect on cholesterol and more importantly, to replace saturated and trans fat with other types of fat, such as monounsaturated fat, polyunsaturated fat, and omega-3 fatty acids. On average, a person should consume no more than 15 to 17 g of saturated fat daily. Saturated and trans fats are considered "bad" fats, and they will raise LDL cholesterol. Saturated fats are primarily found in animal products such as meats, butter, and cream. However, that does not mean you need to give up all your favorite foods. Today, there are good tasting, low-fat, low-cholesterol substitutes for most of the things you like to eat. Choose low-fat or nonfat alternatives. Choose round or loin cuts of red meat. These types of cuts are lowest in fat and cholesterol. Chicken (without the skin), fish, veal, and ground [place] breast are great choices. Eliminate fatty meats, such as hot dogs and salami. Even shellfish have little or no saturated fat. Have a 3 oz (85 g) portion when you eat lean meat, poultry, or fish. Trans fats are also called "partially hydrogenated oils." They are oils that have been scientifically manipulated so that they are solid at room  temperature resulting in a longer shelf life and improved taste and texture of foods in which they are added. Trans fats are found in stick margarine, some tub margarines, cookies, crackers, and baked goods.  When baking and cooking, oils are a great substitute for butter. The monounsaturated oils are  especially beneficial since it is believed they lower LDL and raise HDL. The oils you should avoid entirely are saturated tropical oils, such as coconut and palm.  Remember to eat a lot from food groups that are naturally free of saturated and trans fat, including fish, fruit, vegetables, beans, grains (barley, rice, couscous, bulgur wheat), and pasta (without cream sauces).  IDENTIFYING FOODS THAT LOWER CHOLESTEROL  Soluble fiber may lower your cholesterol. This type of fiber is found in fruits such as apples, vegetables such as broccoli, potatoes, and carrots, legumes such as beans, peas, and lentils, and grains such as barley. Foods fortified with plant sterols (phytosterol) may also lower cholesterol. You should eat at least 2 g per day of these foods for a cholesterol lowering effect.  Read package labels to identify low-saturated fats, trans fat free, and low-fat foods at the supermarket. Select cheeses that have only 2 to 3 g saturated fat per ounce. Use a heart-healthy tub margarine that is free of trans fats or partially hydrogenated oil. When buying baked goods (cookies, crackers), avoid partially hydrogenated oils. Breads and muffins should be made from whole grains (whole-wheat or whole oat flour, instead of "flour" or "enriched flour"). Buy non-creamy canned soups with reduced salt and no added fats.  FOOD PREPARATION TECHNIQUES  Never deep-fry. If you must fry, either stir-fry, which uses very little fat, or use non-stick cooking sprays. When possible, broil, bake, or roast meats, and steam vegetables. Instead of putting butter or margarine on vegetables, use lemon and herbs, applesauce, and cinnamon (for squash and sweet potatoes), nonfat yogurt, salsa, and low-fat dressings for salads.  LOW-SATURATED FAT / LOW-FAT FOOD SUBSTITUTES Meats / Saturated Fat (g)  Avoid: Steak, marbled (3 oz/85 g) / 11 g  Choose: Steak, lean (3 oz/85 g) / 4 g  Avoid: Hamburger (3 oz/85 g) / 7 g  Choose:  Hamburger, lean (3 oz/85 g) / 5 g  Avoid: Ham (3 oz/85 g) / 6 g  Choose: Ham, lean cut (3 oz/85 g) / 2.4 g  Avoid: Chicken, with skin, dark meat (3 oz/85 g) / 4 g  Choose: Chicken, skin removed, dark meat (3 oz/85 g) / 2 g  Avoid: Chicken, with skin, light meat (3 oz/85 g) / 2.5 g  Choose: Chicken, skin removed, light meat (3 oz/85 g) / 1 g Dairy / Saturated Fat (g)  Avoid: Whole milk (1 cup) / 5 g  Choose: Low-fat milk, 2% (1 cup) / 3 g  Choose: Low-fat milk, 1% (1 cup) / 1.5 g  Choose: Skim milk (1 cup) / 0.3 g  Avoid: Hard cheese (1 oz/28 g) / 6 g  Choose: Skim milk cheese (1 oz/28 g) / 2 to 3 g  Avoid: Cottage cheese, 4% fat (1 cup) / 6.5 g  Choose: Low-fat cottage cheese, 1% fat (1 cup) / 1.5 g  Avoid: Ice cream (1 cup) / 9 g  Choose: Sherbet (1 cup) / 2.5 g  Choose: Nonfat frozen yogurt (1 cup) / 0.3 g  Choose: Frozen fruit bar / trace  Avoid: Whipped cream (1 tbs) / 3.5 g  Choose: Nondairy whipped topping (1 tbs) / 1 g Condiments / Saturated Fat (g)  Avoid: Mayonnaise (1 tbs) / 2 g  Choose: Low-fat mayonnaise (1 tbs) / 1 g  Avoid: Butter (1 tbs) / 7 g  Choose: Extra light margarine (1 tbs) / 1 g  Avoid: Coconut oil (1 tbs) / 11.8 g  Choose: Olive oil (1 tbs) / 1.8 g  Choose: Corn oil (1 tbs) / 1.7 g  Choose: Safflower oil (1 tbs) / 1.2 g  Choose: Sunflower oil (1 tbs) / 1.4 g  Choose: Soybean oil (1 tbs) / 2.4 g  Choose: Canola oil (1 tbs) / 1 g Document Released: 07/20/2005 Document Revised: 10/12/2011 Document Reviewed: 01/08/2011 Exit[employer] Patient Information 2014 [place], [place].

## 2013-05-30 NOTE — Progress Notes (Signed)
Subjective:    Patient ID: [name], female    DOB: 04/03/1928, 79 y.o.   MRN: [id number]  HPI  79 year old WF, nonsmoker, patient of Dr. Lovell Sheehan is in today as an ED follow-up from 05/26/2013 with worsening aphasia. CT scan was negative but MRI showed 13mm non hemorrhagic infarct of the left lentiform nucleus and corona radiata. At that time she was on Pradaxa. She has since been switched to Eliquis by Neurology. Her symptoms have completely resolved. She had residual weakness to the right arm that resolved quickly in the ED. she was also found to have orthostatic hypotension and has since been put on Proamatine. She is tolerating it well. Prior to this incident, she was to undergo physical therapy as directed by orthopedics.  Patient has a history of depression and had been on Cymbalta in the past but is not currently taking any medication. Her portions are more tearful. Does not want to take any medication at this point.  Review of Systems  Constitutional: Negative.   Respiratory: Negative.   Cardiovascular: Negative.   Gastrointestinal: Negative.   Endocrine: Negative.   Genitourinary: Negative.   Musculoskeletal: Negative.   Skin: Negative.   Allergic/Immunologic: Negative.   Neurological: Positive for speech difficulty. Negative for dizziness.  Hematological: Negative.   Psychiatric/Behavioral: Negative.    Past Medical History  Diagnosis Date  . GERD (gastroesophageal reflux disease)   . History of TIAs   . Diverticulosis   . PUD (peptic ulcer disease)   . PVD (peripheral vascular disease)   . Hyperlipidemia   . Anxiety   . Depression   . Blood transfusion     "w/1st miscarriage" (05/26/2013)  . COPD (chronic obstructive pulmonary disease)   . Hypertension   . PONV (postoperative nausea and vomiting)   . Family history of anesthesia complication     "sisters also get sick as a dog" (05/26/2013)  . Coronary artery disease   . Heart murmur     "when I was a  child" (05/26/2013)  . DVT (deep venous thrombosis) 1954    "after I had my first child" (05/26/2013)  . Pneumonia 2013    "once" (05/26/2013)  . Exertional shortness of breath   . H/O hiatal hernia   . Stroke 10/2002    no residual  . Stroke 05/26/2013    "very mild; affected my speech for a while" (05/26/2013)  . Arthritis     "lots; hands" (05/26/2013)  . 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 Lymphona, mantle cell, inguinal region/lower limb     "large c-cell" (05/26/2013)    History   Social History  . Marital Status: Married    Spouse Name: N/A    Number of Children: 6  . Years of Education: N/A   Occupational History  . retired    Social History Main Topics  . Smoking status: Former Smoker -- 2.50 packs/day for 37 years    Types: Cigarettes    Quit date: 09/05/1984  . Smokeless tobacco: Never Used  . Alcohol Use: No  . Drug Use: No  . Sexual Activity: Not Currently   Other Topics Concern  . Not on file   Social History Narrative  . No narrative on file    Past Surgical History  Procedure Laterality Date  . 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 Tubal ligation  1972  . Radical neck  dissection  1986    "larynx cancer" (05/26/2013)  . Total knee arthroplasty Bilateral     2002 left 2008 right  . Ileostomy closure  01/2010  . Cataract extraction  2005    bilateral  . Carotid endarterectomy Bilateral 2004    2004 a month apart right and left  . Tonsillectomy and adenoidectomy  1936    "tonsils grew back; no 2nd OR" (05/26/2013)  . Hernia repair  05/2010    ventral hernia repair  . Carpal tunnel release Left ?1960's  . Abdominal exploration surgery  12/01/2012  . Appendectomy  09/13/2008  . Colon surgery  09/13/2008    Laparoscopic assisted converted to open sigmoid colectomy.Hattie Perch 09/13/2008 (05/26/2013)  . Cataract extraction w/ intraocular lens   implant, bilateral Bilateral ~ 2003  . Dilation and curettage of uterus  1950's    "had 2 for miscarriages" (05/26/2013)    Family History  Problem Relation Age of Onset  . Colon cancer Sister     colon  . Hyperlipidemia Mother   . Stroke Mother   . Cancer Father     mesothelioma    Allergies  Allergen Reactions  . Propoxyphene Hcl   . Propoxyphene-Acetaminophen     REACTION: Reaction not known    Current Outpatient Prescriptions on File Prior to Visit  Medication Sig Dispense Refill  . ALPRAZolam (XANAX) 0.5 MG tablet Take 1 tablet (0.5 mg total) by mouth at bedtime as needed for sleep.  30 tablet  3  . apixaban (ELIQUIS) 2.5 MG TABS tablet Take 1 tablet (2.5 mg total) by mouth 2 (two) times daily.  60 tablet  0  . bisoprolol (ZEBETA) 5 MG tablet Take 5 mg by mouth daily at 12 noon.       . cefUROXime (CEFTIN) 500 MG tablet Take 1 tablet (500 mg total) by mouth 2 (two) times daily.  14 tablet  0  . clotrimazole-betamethasone (LOTRISONE) lotion APPLY TOPICALLY TWICE DAILY.  30 mL  3  . Cyanocobalamin (VITAMIN B-12 PO) Take 1 tablet by mouth daily.      . diclofenac sodium (VOLTAREN) 1 % GEL Apply 2 g topically 4 (four) times daily.  100 g  11  . midodrine (PROAMATINE) 5 MG tablet Take 1 tablet (5 mg total) by mouth 3 (three) times daily with meals.  90 tablet  0  . Multiple Vitamin (MULTIVITAMIN WITH MINERALS) TABS tablet Take 1 tablet by mouth daily.      . Vitamin D, Ergocalciferol, (DRISDOL) 50000 UNITS CAPS Take 50,000 Units by mouth every Sunday.       No current facility-administered medications on file prior to visit.    BP 134/84  Pulse 77  Wt 192 lb (87.091 kg)  BMI 32.46 kg/m2chart    Objective:   Physical Exam  Constitutional: She is oriented to person, place, and time. She appears well-developed and well-nourished.  HENT:  Right Ear: External ear normal.  Left Ear: External ear normal.  Nose: Nose normal.  Mouth/Throat: Oropharynx is clear and moist.   Neck: Normal range of motion. Neck supple.  Cardiovascular: Normal rate, regular rhythm and normal heart sounds.   Pulmonary/Chest: Effort normal and breath sounds normal.  Musculoskeletal: Normal range of motion.  Neurological: She is alert and oriented to person, place, and time. She has normal reflexes. She displays normal reflexes. No cranial nerve deficit. She exhibits normal muscle tone. Coordination normal.  Skin: Skin is warm and dry.  Psychiatric: She has a normal mood and  affect.          Assessment & Plan:  Assessment:  1. Non-hemmorrhagic CVA 2. Treatment failure to anticoagulation therapy 3. Hyperlipidemia 4. Lower sclerosis 5. Hypertension 6. Depression-not currently on medication  Plan: Resume VESIcare. Continue Eliquis. See neurology as scheduled. Physical therapy as scheduled. In 6 weeks and sooner as needed. Explained to patient that depression is common after a CVA. Consider therapy if she does not bounce back soon.

## 2013-05-31 ENCOUNTER — Inpatient Hospital Stay (HOSPITAL_COMMUNITY)
Admission: EM | Admit: 2013-05-31 | Discharge: 2013-06-01 | DRG: 065 | Disposition: A | Discharge status: Home / Self Care | Payer: Medicare Other | Attending: Internal Medicine | Admitting: Internal Medicine

## 2013-05-31 ENCOUNTER — Emergency Department (HOSPITAL_COMMUNITY): Payer: Medicare Other

## 2013-05-31 ENCOUNTER — Encounter (HOSPITAL_COMMUNITY): Payer: Self-pay | Admitting: *Deleted

## 2013-05-31 ENCOUNTER — Telehealth: Payer: Self-pay | Admitting: Internal Medicine

## 2013-05-31 ENCOUNTER — Observation Stay (HOSPITAL_COMMUNITY): Payer: Medicare Other

## 2013-05-31 DIAGNOSIS — J4489 Other specified chronic obstructive pulmonary disease: Secondary | ICD-10-CM

## 2013-05-31 DIAGNOSIS — Z79899 Other long term (current) drug therapy: Secondary | ICD-10-CM

## 2013-05-31 DIAGNOSIS — R1319 Other dysphagia: Secondary | ICD-10-CM

## 2013-05-31 DIAGNOSIS — M1710 Unilateral primary osteoarthritis, unspecified knee: Secondary | ICD-10-CM

## 2013-05-31 DIAGNOSIS — I639 Cerebral infarction, unspecified: Secondary | ICD-10-CM

## 2013-05-31 DIAGNOSIS — I4891 Unspecified atrial fibrillation: Secondary | ICD-10-CM

## 2013-05-31 DIAGNOSIS — J449 Chronic obstructive pulmonary disease, unspecified: Secondary | ICD-10-CM | POA: Diagnosis present

## 2013-05-31 DIAGNOSIS — F411 Generalized anxiety disorder: Secondary | ICD-10-CM

## 2013-05-31 DIAGNOSIS — R5381 Other malaise: Secondary | ICD-10-CM

## 2013-05-31 DIAGNOSIS — I10 Essential (primary) hypertension: Secondary | ICD-10-CM

## 2013-05-31 DIAGNOSIS — Z8673 Personal history of transient ischemic attack (TIA), and cerebral infarction without residual deficits: Secondary | ICD-10-CM | POA: Diagnosis present

## 2013-05-31 DIAGNOSIS — K432 Incisional hernia without obstruction or gangrene: Secondary | ICD-10-CM

## 2013-05-31 DIAGNOSIS — M19049 Primary osteoarthritis, unspecified hand: Secondary | ICD-10-CM

## 2013-05-31 DIAGNOSIS — I6350 Cerebral infarction due to unspecified occlusion or stenosis of unspecified cerebral artery: Principal | ICD-10-CM | POA: Diagnosis present

## 2013-05-31 DIAGNOSIS — I480 Paroxysmal atrial fibrillation: Secondary | ICD-10-CM | POA: Diagnosis present

## 2013-05-31 DIAGNOSIS — R0602 Shortness of breath: Secondary | ICD-10-CM

## 2013-05-31 DIAGNOSIS — M7501 Adhesive capsulitis of right shoulder: Secondary | ICD-10-CM

## 2013-05-31 DIAGNOSIS — M79642 Pain in left hand: Secondary | ICD-10-CM

## 2013-05-31 DIAGNOSIS — R509 Fever, unspecified: Secondary | ICD-10-CM

## 2013-05-31 DIAGNOSIS — Z800 Family history of malignant neoplasm of digestive organs: Secondary | ICD-10-CM

## 2013-05-31 DIAGNOSIS — D649 Anemia, unspecified: Secondary | ICD-10-CM

## 2013-05-31 DIAGNOSIS — K5730 Diverticulosis of large intestine without perforation or abscess without bleeding: Secondary | ICD-10-CM | POA: Diagnosis present

## 2013-05-31 DIAGNOSIS — R4781 Slurred speech: Secondary | ICD-10-CM

## 2013-05-31 DIAGNOSIS — Z8521 Personal history of malignant neoplasm of larynx: Secondary | ICD-10-CM

## 2013-05-31 DIAGNOSIS — R197 Diarrhea, unspecified: Secondary | ICD-10-CM

## 2013-05-31 DIAGNOSIS — I739 Peripheral vascular disease, unspecified: Secondary | ICD-10-CM

## 2013-05-31 DIAGNOSIS — E860 Dehydration: Secondary | ICD-10-CM

## 2013-05-31 DIAGNOSIS — R609 Edema, unspecified: Secondary | ICD-10-CM

## 2013-05-31 DIAGNOSIS — K625 Hemorrhage of anus and rectum: Secondary | ICD-10-CM

## 2013-05-31 DIAGNOSIS — K632 Fistula of intestine: Secondary | ICD-10-CM

## 2013-05-31 DIAGNOSIS — N179 Acute kidney failure, unspecified: Secondary | ICD-10-CM

## 2013-05-31 DIAGNOSIS — L659 Nonscarring hair loss, unspecified: Secondary | ICD-10-CM

## 2013-05-31 DIAGNOSIS — Z96659 Presence of unspecified artificial knee joint: Secondary | ICD-10-CM

## 2013-05-31 DIAGNOSIS — Z86718 Personal history of other venous thrombosis and embolism: Secondary | ICD-10-CM

## 2013-05-31 DIAGNOSIS — M1990 Unspecified osteoarthritis, unspecified site: Secondary | ICD-10-CM

## 2013-05-31 DIAGNOSIS — M79641 Pain in right hand: Secondary | ICD-10-CM

## 2013-05-31 DIAGNOSIS — E785 Hyperlipidemia, unspecified: Secondary | ICD-10-CM

## 2013-05-31 DIAGNOSIS — K219 Gastro-esophageal reflux disease without esophagitis: Secondary | ICD-10-CM

## 2013-05-31 DIAGNOSIS — M542 Cervicalgia: Secondary | ICD-10-CM

## 2013-05-31 DIAGNOSIS — R4701 Aphasia: Secondary | ICD-10-CM

## 2013-05-31 DIAGNOSIS — I2510 Atherosclerotic heart disease of native coronary artery without angina pectoris: Secondary | ICD-10-CM | POA: Diagnosis present

## 2013-05-31 DIAGNOSIS — M129 Arthropathy, unspecified: Secondary | ICD-10-CM | POA: Diagnosis present

## 2013-05-31 DIAGNOSIS — F329 Major depressive disorder, single episode, unspecified: Secondary | ICD-10-CM | POA: Diagnosis present

## 2013-05-31 DIAGNOSIS — K279 Peptic ulcer, site unspecified, unspecified as acute or chronic, without hemorrhage or perforation: Secondary | ICD-10-CM

## 2013-05-31 DIAGNOSIS — K5733 Diverticulitis of large intestine without perforation or abscess with bleeding: Secondary | ICD-10-CM

## 2013-05-31 DIAGNOSIS — F3289 Other specified depressive episodes: Secondary | ICD-10-CM

## 2013-05-31 DIAGNOSIS — C8510 Unspecified B-cell lymphoma, unspecified site: Secondary | ICD-10-CM

## 2013-05-31 DIAGNOSIS — G459 Transient cerebral ischemic attack, unspecified: Secondary | ICD-10-CM

## 2013-05-31 DIAGNOSIS — R079 Chest pain, unspecified: Secondary | ICD-10-CM

## 2013-05-31 LAB — POCT I-STAT, CHEM 8
BUN: 17 mg/dL (ref 6–23)
Chloride: 106 mEq/L (ref 96–112)
Creatinine, Ser: 1.3 mg/dL — ABNORMAL HIGH (ref 0.50–1.10)
Hemoglobin: 14.6 g/dL (ref 12.0–15.0)
Potassium: 3.7 mEq/L (ref 3.5–5.1)
Sodium: 139 mEq/L (ref 135–145)

## 2013-05-31 LAB — POCT I-STAT TROPONIN I

## 2013-05-31 LAB — CBC
HCT: 39.6 % (ref 36.0–46.0)
MCH: 29.1 pg (ref 26.0–34.0)
MCHC: 32.6 g/dL (ref 30.0–36.0)
MCV: 89.4 fL (ref 78.0–100.0)
Platelets: 181 10*3/uL (ref 150–400)
RDW: 14 % (ref 11.5–15.5)
WBC: 6.3 10*3/uL (ref 4.0–10.5)

## 2013-05-31 LAB — CREATININE, SERUM: GFR calc Af Amer: 51 mL/min — ABNORMAL LOW (ref >90)

## 2013-05-31 MED ORDER — ALPRAZOLAM 0.25 MG PO TABS
0.5000 mg | ORAL_TABLET | Freq: Once | ORAL | Status: DC
  Administered 2013-05-31: 0.5 mg via ORAL
  Filled 2013-05-31: qty 2

## 2013-05-31 MED ORDER — LOPERAMIDE HCL 2 MG PO CAPS
2.0000 mg | ORAL_CAPSULE | Freq: Four times a day (QID) | ORAL | Status: DC | PRN
  Filled 2013-05-31: qty 1

## 2013-05-31 MED ORDER — DARIFENACIN HYDROBROMIDE ER 7.5 MG PO TB24
7.5000 mg | ORAL_TABLET | Freq: Every day | ORAL | Status: DC
  Administered 2013-06-01: 7.5 mg via ORAL
  Filled 2013-05-31: qty 1

## 2013-05-31 MED ORDER — VITAMIN D (ERGOCALCIFEROL) 1.25 MG (50000 UNIT) PO CAPS: 50000.0000 [IU] | ORAL_CAPSULE | ORAL | Status: DC

## 2013-05-31 MED ORDER — ALPRAZOLAM 0.5 MG PO TABS
0.5000 mg | ORAL_TABLET | Freq: Every evening | ORAL | Status: DC | PRN
Start: 2013-05-31 — End: 2013-06-01

## 2013-05-31 MED ORDER — POLYETHYLENE GLYCOL 3350 17 G PO PACK
17.0000 g | PACK | Freq: Every day | ORAL | Status: DC | PRN
  Filled 2013-05-31: qty 1

## 2013-05-31 MED ORDER — ONDANSETRON HCL 4 MG PO TABS: 4.0000 mg | ORAL_TABLET | Freq: Four times a day (QID) | ORAL | Status: DC | PRN

## 2013-05-31 MED ORDER — SODIUM CHLORIDE 0.9 % IJ SOLN: 3.0000 mL | Freq: Two times a day (BID) | INTRAMUSCULAR | Status: DC

## 2013-05-31 MED ORDER — ONDANSETRON HCL 4 MG/2ML IJ SOLN: 4.0000 mg | Freq: Four times a day (QID) | INTRAMUSCULAR | Status: DC | PRN

## 2013-05-31 MED ORDER — HEPARIN SODIUM (PORCINE) 5000 UNIT/ML IJ SOLN: 5000.0000 [IU] | Freq: Three times a day (TID) | INTRAMUSCULAR | Status: DC

## 2013-05-31 MED ORDER — SODIUM CHLORIDE 0.9 % IV SOLN
INTRAVENOUS | Status: DC
  Administered 2013-05-31: 1000 mL via INTRAVENOUS
  Administered 2013-06-01: 10:00:00 via INTRAVENOUS

## 2013-05-31 MED ORDER — ALPRAZOLAM 0.5 MG PO TABS
0.5000 mg | ORAL_TABLET | Freq: Two times a day (BID) | ORAL | Status: DC | PRN
Start: 2013-05-31 — End: 2013-06-01

## 2013-05-31 MED ORDER — BISOPROLOL FUMARATE 5 MG PO TABS
5.0000 mg | ORAL_TABLET | Freq: Every day | ORAL | Status: DC
  Administered 2013-06-01: 5 mg via ORAL
  Filled 2013-05-31: qty 1

## 2013-05-31 MED ORDER — HYDROCODONE-ACETAMINOPHEN 5-325 MG PO TABS: 1.0000 | ORAL_TABLET | ORAL | Status: DC | PRN

## 2013-05-31 MED ORDER — APIXABAN 2.5 MG PO TABS: 2.5000 mg | ORAL_TABLET | Freq: Two times a day (BID) | ORAL | Status: DC

## 2013-05-31 MED ORDER — APIXABAN 5 MG PO TABS
5.0000 mg | ORAL_TABLET | Freq: Two times a day (BID) | ORAL | Status: DC
  Administered 2013-05-31 – 2013-06-01 (×2): 5 mg via ORAL
  Filled 2013-05-31 (×3): qty 1

## 2013-05-31 NOTE — ED Notes (Signed)
Called MRI, reporting will send for patient soon. We will medicate her with ordered meds.

## 2013-05-31 NOTE — ED Notes (Signed)
Pt from home c/o aphasia with LSN at 1300; pt had ischemic stroke last week and discharged Friday per family; pt with some difficulty word finding; no other obvious neuro deficits

## 2013-05-31 NOTE — ED Notes (Signed)
Pt from home.  Per family, pt recently d/c'd with CVA.  Pt LSN 1100 this morning.  Family reports pt is having trouble speaking.

## 2013-05-31 NOTE — Telephone Encounter (Signed)
Daughter called and would like for you to know they are in the ed w/ pt. They think she had another stoke.

## 2013-05-31 NOTE — ED Notes (Signed)
Pt and family updated about pt going to MRI today.

## 2013-05-31 NOTE — ED Notes (Addendum)
Pt transported to MRI. Pt provided a denture cup with pt label on it for pt dentures.

## 2013-05-31 NOTE — ED Notes (Signed)
Admitting at bedside 

## 2013-05-31 NOTE — Consult Note (Addendum)
Neurology Consultation Reason for Consult: Aphasia Referring Physician: Horton, C  CC: Aphasia  History is obtained from:patient  HPI: [name] is a 79 y.o. female with a recent infarct who presents with recurrent aphasia today. Around 3pm today, she had difficulty getting her words out and subsequently could not speak at all. She was brought in by EMS, initially on exam, she did have a significant expressive aphasia, however her symptoms improved markedly during the exam.   tpa given: no, recent stroke and rapidly improving symptoms NIHSS: 1 mild aphasia  ROS: A 14 point ROS was performed and is negative except as noted in the HPI.  Past Medical History  Diagnosis Date  . GERD (gastroesophageal reflux disease)   . History of TIAs   . Diverticulosis   . PUD (peptic ulcer disease)   . PVD (peripheral vascular disease)   . Hyperlipidemia   . Anxiety   . Depression   . Blood transfusion     "w/1st miscarriage" (05/26/2013)  . COPD (chronic obstructive pulmonary disease)   . Hypertension   . PONV (postoperative nausea and vomiting)   . Family history of anesthesia complication     "sisters also get sick as a dog" (05/26/2013)  . Coronary artery disease   . Heart murmur     "when I was a child" (05/26/2013)  . DVT (deep venous thrombosis) 1954    "after I had my first child" (05/26/2013)  . Pneumonia 2013    "once" (05/26/2013)  . Exertional shortness of breath   . H/O hiatal hernia   . Stroke 10/2002    no residual  . Stroke 05/26/2013    "very mild; affected my speech for a while" (05/26/2013)  . Arthritis     "lots; hands" (05/26/2013)  . 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 Lymphona, mantle cell, inguinal region/lower limb     "large c-cell" (05/26/2013)    Family History: Motehr - cva  Social History: Tob: former smoker  Exam: Current vital signs: BP 148/85  Pulse 78  Temp(Src) 98 F (36.7 C)  (Oral)  Resp 18  Ht 5\' 4"  (1.626 m)  Wt 87.091 kg (192 lb)  BMI 32.94 kg/m2  SpO2 92% Vital signs in last 24 hours: Temp:  [98 F (36.7 C)-98.5 F (36.9 C)] 98 F (36.7 C) (10/29 2031) Pulse Rate:  [60-100] 78 (10/29 2031) Resp:  [18-24] 18 (10/29 2031) BP: (103-184)/(67-118) 148/85 mmHg (10/29 2031) SpO2:  [87 %-96 %] 92 % (10/29 2031) Weight:  [87.091 kg (192 lb)] 87.091 kg (192 lb) (10/29 2031)  General: in bed, NAD CV: RRR Mental Status: Patient is awake, alert, oriented to person, place, month, year, and situation. Immediate and remote memory are intact. Patient is able to give a clear and coherent history. No signs of neglect She has mild difficulty with repetition and reading.  Cranial Nerves: II: Visual Fields are full. Pupils are equal, round, and reactive to light.  Discs are difficult to visualize. III,IV, VI: EOMI without ptosis or diploplia.  V: Facial sensation is symmetric to temperature VII: Facial movement is symmetric.  VIII: hearing is intact to voice X: Uvula elevates symmetrically XI: Shoulder shrug is symmetric. XII: tongue is midline without atrophy or fasciculations.  Motor: Tone is normal. Bulk is normal. 5/5 strength was present in all four extremities.  Sensory: Sensation is symmetric to light touch and pin in the arms and legs. Deep Tendon Reflexes:  2+ and symmetric in the biceps and patellae.  Cerebellar: FNF and HKS are without dysmetria, but mild tremor on FNF bilaterally Gait: Not assessed due to acute nature of evaluation and multiple medical monitors in ED setting.   I have reviewed labs in epic and the results pertinent to this consultation are: Cr 1.3  I have reviewed the images obtained:CT head- no changes  Impression: 79 yo F with likely recurrent ischemia vs. TIA. I would favor observing her, but with the recent workup, no need for full repetition.   Recommendations: 1) MRI brain.  2) continue elequis for secondary  prevention.    Ritta Slot, MD [facility] [phone]  If 7pm- 7am, please page neurology on call at [phone].

## 2013-05-31 NOTE — Code Documentation (Signed)
79yo female arriving to [facility] at 6 by family.  Code stroke called at 1614 for symptoms of aphasia.  Patient and family report that patient was discharged from the hospital on 05/29/13 with a diagnosis of stroke.  Patient reports that she was back to her baseline at time of discharge with no residual deficits.  She reports her previous symptoms were similar to today, but resolved.  Per records, the patient had a positive MRI for an acute non hemorrhagic infarct of the left lentiform nucleus and corona radiata on 05/27/13.  Patient had previously been taking Pradaxa, but was discharged on Eliquis.  Patient took her dose of Eliquis this morning.  LKW at 1300 per family, but patient reports symptom onset at 1500 when she was having trouble getting her words out.  EDP exam/cleared for CT at 1615, stroke team arrived at 1620, neurologist arrived at 59, patient arrived in CT at 1617, phlebotomist arrived at 1620.  CT completed, NIHSS 0 on arrival.  Patient and family report that her speech is improved, but not back to baseline.  Patient is contraindicated for treatment with tPA due to her recent stroke and taking Eliquis.  Bedside handoff with ED RN Maralyn Sago.

## 2013-05-31 NOTE — ED Provider Notes (Signed)
CSN: [id number]     Arrival date & time 05/31/13  1604 History   First MD Initiated Contact with Patient 05/31/13 1619     Chief Complaint  Patient presents with  . Code Stroke   (Consider location/radiation/quality/duration/timing/severity/associated sxs/prior Treatment) The history is provided by a relative and the spouse. No language interpreter was used.   Patient is a 79 y.o. Caucasian female with past medical history of TIAs was just discharged from the hospital 3 days ago for an ischemic stroke. She is to carotid Dopplers at bedtime for evaluation. She was started on new medications instructed to return to emergency department she has symptoms of stroke. Today at 3:30 she began developing difficulty speaking. She did not have any weakness, numbness, or visual changes. Her family brought her immediately to the emergency department. She was activated as a code stroke upon arrival.  Past Medical History  Diagnosis Date  . GERD (gastroesophageal reflux disease)   . History of TIAs   . Diverticulosis   . PUD (peptic ulcer disease)   . PVD (peripheral vascular disease)   . Hyperlipidemia   . Anxiety   . Depression   . Blood transfusion     "w/1st miscarriage" (05/26/2013)  . COPD (chronic obstructive pulmonary disease)   . Hypertension   . PONV (postoperative nausea and vomiting)   . Family history of anesthesia complication     "sisters also get sick as a dog" (05/26/2013)  . Coronary artery disease   . Heart murmur     "when I was a child" (05/26/2013)  . DVT (deep venous thrombosis) 1954    "after I had my first child" (05/26/2013)  . Pneumonia 2013    "once" (05/26/2013)  . Exertional shortness of breath   . H/O hiatal hernia   . Stroke 10/2002    no residual  . Stroke 05/26/2013    "very mild; affected my speech for a while" (05/26/2013)  . Arthritis     "lots; hands" (05/26/2013)  . Laryngeal carcinoma hx of ca    remotely with resection and xrt/chronic hoarsemenss   . Lymphona, mantle cell, inguinal region/lower limb     "large c-cell" (05/26/2013)   Past Surgical History  Procedure Laterality Date  . 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 Tubal ligation  1972  . Radical neck dissection  1986    "larynx cancer" (05/26/2013)  . Total knee arthroplasty Bilateral     2002 left 2008 right  . Ileostomy closure  01/2010  . Cataract extraction  2005    bilateral  . Carotid endarterectomy Bilateral 2004    2004 a month apart right and left  . Tonsillectomy and adenoidectomy  1936    "tonsils grew back; no 2nd OR" (05/26/2013)  . Hernia repair  05/2010    ventral hernia repair  . Carpal tunnel release Left ?1960's  . Abdominal exploration surgery  12/01/2012  . Appendectomy  09/13/2008  . Colon surgery  09/13/2008    Laparoscopic assisted converted to open sigmoid colectomy.Hattie Perch 09/13/2008 (05/26/2013)  . Cataract extraction w/ intraocular lens  implant, bilateral Bilateral ~ 2003  . Dilation and curettage of uterus  1950's    "had 2 for miscarriages" (05/26/2013)   Family History  Problem Relation Age of Onset  . Colon cancer Sister     colon  . Hyperlipidemia Mother   . Stroke Mother   . Cancer Father     mesothelioma  History  Substance Use Topics  . Smoking status: Former Smoker -- 2.50 packs/day for 37 years    Types: Cigarettes    Quit date: 09/05/1984  . Smokeless tobacco: Never Used  . Alcohol Use: No   OB History   Grav Para Term Preterm Abortions TAB SAB Ect Mult Living                 Review of Systems  Constitutional: Negative for fever and chills.  HENT: Positive for voice change. Negative for trouble swallowing.   Eyes: Negative for visual disturbance.  Respiratory: Negative for cough and shortness of breath.   Gastrointestinal: Negative for nausea, vomiting, diarrhea and constipation.  Genitourinary: Negative for dysuria, urgency and frequency.  Neurological:  Negative for tremors, weakness, numbness and headaches.  All other systems reviewed and are negative.    Allergies  Propoxyphene hcl and Propoxyphene-acetaminophen  Home Medications   Current Outpatient Rx  Name  Route  Sig  Dispense  Refill  . ALPRAZolam (XANAX) 0.5 MG tablet   Oral   Take 1 tablet (0.5 mg total) by mouth at bedtime as needed for sleep.   30 tablet   3   . apixaban (ELIQUIS) 2.5 MG TABS tablet   Oral   Take 1 tablet (2.5 mg total) by mouth 2 (two) times daily.   60 tablet   0   . bisoprolol (ZEBETA) 5 MG tablet   Oral   Take 5 mg by mouth daily at 12 noon.          . cefUROXime (CEFTIN) 500 MG tablet   Oral   Take 1 tablet (500 mg total) by mouth 2 (two) times daily.   14 tablet   0   . clotrimazole-betamethasone (LOTRISONE) cream   Topical   Apply 1 application topically 2 (two) times daily.         . Cyanocobalamin (VITAMIN B-12 PO)   Oral   Take 1 tablet by mouth daily.         . diclofenac sodium (VOLTAREN) 1 % GEL   Topical   Apply 2 g topically 4 (four) times daily as needed (pain).         Marland Kitchen loperamide (IMODIUM) 2 MG capsule   Oral   Take 2 mg by mouth 4 (four) times daily as needed for diarrhea or loose stools.         . midodrine (PROAMATINE) 5 MG tablet   Oral   Take 1 tablet (5 mg total) by mouth 3 (three) times daily with meals.   90 tablet   0   . Multiple Vitamin (MULTIVITAMIN WITH MINERALS) TABS tablet   Oral   Take 1 tablet by mouth daily.         . solifenacin (VESICARE) 5 MG tablet   Oral   Take 2 tablets (10 mg total) by mouth daily.   30 tablet   3   . Vitamin D, Ergocalciferol, (DRISDOL) 50000 UNITS CAPS   Oral   Take 50,000 Units by mouth every Sunday.          BP 151/104  Pulse 70  Temp(Src) 98.2 F (36.8 C) (Oral)  Resp 20  SpO2 94% Physical Exam  Nursing note and vitals reviewed. Constitutional: She is oriented to person, place, and time. She appears well-developed and  well-nourished. No distress.  HENT:  Head: Normocephalic and atraumatic.  Eyes: Pupils are equal, round, and reactive to light.  Neck: Normal range of motion.  Cardiovascular: Normal rate, regular rhythm, normal heart sounds and intact distal pulses.   Pulmonary/Chest: Effort normal. No respiratory distress. She has no wheezes. She exhibits no tenderness.  Abdominal: Soft. Bowel sounds are normal. She exhibits no distension. There is no tenderness. There is no rebound and no guarding.  Neurological: She is alert and oriented to person, place, and time. She has normal strength. No cranial nerve deficit or sensory deficit. She exhibits normal muscle tone. Coordination and gait normal.  Normal speech, no word finding difficulty.  Answers questions appropriately.  Strength 5/5 bilateral upper and lower extremities.  Sensation intact to all extremities.  No visual field defects.    Skin: Skin is warm and dry.    ED Course  Procedures (including critical care time) Labs Review Labs Reviewed  POCT I-STAT, CHEM 8 - Abnormal; Notable for the following:    Creatinine, Ser 1.30 (*)    Glucose, Bld 107 (*)    All other components within normal limits  GLUCOSE, CAPILLARY  POCT I-STAT TROPONIN I   Imaging Review Ct Head (brain) Wo Contrast  05/31/2013   CLINICAL DATA:  Code stroke.  Difficulty speaking  EXAM: CT HEAD WITHOUT CONTRAST  TECHNIQUE: Contiguous axial images were obtained from the base of the skull through the vertex without intravenous contrast.  COMPARISON:  05/27/2013  FINDINGS: There is patchy diffuse low-attenuation within the subcortical and periventricular white matter compatible with chronic microvascular disease.  There is prominence of the sulci and ventricles consistent with brain atrophy. Evolutionary changes of subacute infarct within the left lentiform nucleus and coronal radiata is again noted.  No midline shift, ventriculomegaly, mass effect, evidence of mass lesion,  intracranial hemorrhage or evidence of cortically based acute infarction. Gray-white matter differentiation is within normal limits throughout the brain.  The paranasal sinuses and mastoid air cells are clear. The skull is intact.  IMPRESSION: 1. Subacute infarct involving the left lentiform nucleus and coronal radiata. No acute intracranial abnormalities noted.  2. Chronic small vessel ischemic disease and brain atrophy.   Electronically Signed   By: Signa Kell M.D.   On: 05/31/2013 16:43    EKG Interpretation     Ventricular Rate:  69 PR Interval:  197 QRS Duration: 84 QT Interval:  408 QTC Calculation: 437 R Axis:   65 Text Interpretation:  Sinus rhythm Probable anteroseptal infarct, old No significant change since last tracing            MDM  This is an 79 year old Caucasian female with past medical history of ischemic stroke comes emergency department today with patient. Physical exam as above.  Upon arrival to the emergency Department is activated as a code stroke. She was evaluated by neurology while in the CT scanner. After she returned from CT scan her neurologic deficits had resolved. Breast workup included an i-STAT Chem-8, i-STAT troponin, and a CBG.  I-STAT Chem-8 was unremarkable. I-STAT troponin was negative. CBG was 98. CT of the head demonstrated no acute intracranial abnormalities it was a subacute infarct in the left for weakness. Neurology felt that the patient required a MRI and admission for observation. Patient was admitted to the hospitalist service in stable condition. Labs and imaging reviewed by myself and considered and medical decision-making. Imaging was interpreted by radiology. Care was discussed with my attending Dr. Wilkie Aye.    1. Aphasia   2. TIA (transient ischemic attack)       Bethann Berkshire, MD 05/31/13 1930

## 2013-05-31 NOTE — ED Notes (Signed)
MRI called to see if pt would have MRI today or tomorrow before giving zanex, MRI states they will call the ED 15 minutes prior to getting pt so that she can receive the zanex.

## 2013-05-31 NOTE — ED Provider Notes (Signed)
I saw and evaluated the patient, reviewed the resident's note and I agree with the findings and plan.  EKG Interpretation     Ventricular Rate:  69 PR Interval:  197 QRS Duration: 84 QT Interval:  408 QTC Calculation: 437 R Axis:   65 Text Interpretation:  Sinus rhythm Probable anteroseptal infarct, old No significant change since last tracing           This is an 79 year old female with recent history of stroke who presents with work finding difficulty. Patient reports onset of symptoms at 3 PM today. She was last seen normal by her family at 1 PM.  Code stroke was initiated. Patient has had resolution of her symptoms in the ED. Patient can name and repeat and is fluent. She has a history of atrial fibrillation. She is otherwise nontoxic on exam. Neurology is evaluating the patient.  They recommend MRI to evaluate for stroke extension. They also recommend admission for observation.  Shon Baton, MD 05/31/13 [phone]

## 2013-05-31 NOTE — H&P (Signed)
[facility] History and Physical  [name] ZOX:[id number] DOB: 1927-12-28 DOA: 05/31/2013  Referring physician: Dr. Tamala Julian PCP: [name], MD  Specialists: Neurology  Chief Complaint: slurred speech  HPI: [name] is a 79 y.o. female  Past medical history of hypertension, previous stroke with her most recent one on 05/28/2013 recently discharged from hospital that comes in for slurred speech that started on the day of admission at 2 PM. She also relates some right lower extremity weakness and heaviness. She relates some unbalance which has resolved She relates that when she got to the emergency room her symptoms resolved. A code stroke was called by the ED and further canceled by the neurologist. Recommended admission for observation.  Review of Systems: The patient denies anorexia, fever, weight loss,, vision loss, decreased hearing, hoarseness, chest pain, syncope, dyspnea on exertion, peripheral edema, , hemoptysis, abdominal pain, melena, hematochezia, severe indigestion/heartburn, hematuria, incontinence, genital sores, muscle weakness, suspicious skin lesions, transient blindness, difficulty walking, depression, unusual weight change, abnormal bleeding, enlarged lymph nodes, angioedema, and breast masses.    Past Medical History  Diagnosis Date  . GERD (gastroesophageal reflux disease)   . History of TIAs   . Diverticulosis   . PUD (peptic ulcer disease)   . PVD (peripheral vascular disease)   . Hyperlipidemia   . Anxiety   . Depression   . Blood transfusion     "w/1st miscarriage" (05/26/2013)  . COPD (chronic obstructive pulmonary disease)   . Hypertension   . PONV (postoperative nausea and vomiting)   . Family history of anesthesia complication     "sisters also get sick as a dog" (05/26/2013)  . Coronary artery disease   . Heart murmur     "when I was a child" (05/26/2013)  . DVT (deep venous thrombosis) 1954    "after I had my first  child" (05/26/2013)  . Pneumonia 2013    "once" (05/26/2013)  . Exertional shortness of breath   . H/O hiatal hernia   . Stroke 10/2002    no residual  . Stroke 05/26/2013    "very mild; affected my speech for a while" (05/26/2013)  . Arthritis     "lots; hands" (05/26/2013)  . 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 Lymphona, mantle cell, inguinal region/lower limb     "large c-cell" (05/26/2013)   Past Surgical History  Procedure Laterality Date  . 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 Tubal ligation  1972  . Radical neck dissection  1986    "larynx cancer" (05/26/2013)  . Total knee arthroplasty Bilateral     2002 left 2008 right  . Ileostomy closure  01/2010  . Cataract extraction  2005    bilateral  . Carotid endarterectomy Bilateral 2004    2004 a month apart right and left  . Tonsillectomy and adenoidectomy  1936    "tonsils grew back; no 2nd OR" (05/26/2013)  . Hernia repair  05/2010    ventral hernia repair  . Carpal tunnel release Left ?1960's  . Abdominal exploration surgery  12/01/2012  . Appendectomy  09/13/2008  . Colon surgery  09/13/2008    Laparoscopic assisted converted to open sigmoid colectomy.Hattie Perch 09/13/2008 (05/26/2013)  . Cataract extraction w/ intraocular lens  implant, bilateral Bilateral ~ 2003  . Dilation and curettage of uterus  1950's    "had 2 for miscarriages" (05/26/2013)   Social History:  reports  that she quit smoking about 28 years ago. Her smoking use included Cigarettes. She has a 92.5 pack-year smoking history. She has never used smokeless tobacco. She reports that she does not drink alcohol or use illicit drugs. Lives at home with husband and daughter  Allergies  Allergen Reactions  . Propoxyphene Hcl   . Propoxyphene-Acetaminophen     REACTION: Reaction not known    Family History  Problem Relation Age of Onset  . Colon  cancer Sister     colon  . Hyperlipidemia Mother   . Stroke Mother   . Cancer Father     mesothelioma    Prior to Admission medications   Medication Sig Start Date End Date Taking? Authorizing Provider  ALPRAZolam Prudy Feeler) 0.5 MG tablet Take 1 tablet (0.5 mg total) by mouth at bedtime as needed for sleep. 05/15/13  Yes Stacie Glaze, MD  apixaban (ELIQUIS) 2.5 MG TABS tablet Take 1 tablet (2.5 mg total) by mouth 2 (two) times daily. 05/29/13  Yes Leroy Sea, MD  bisoprolol (ZEBETA) 5 MG tablet Take 5 mg by mouth daily at 12 noon.    Yes Historical Provider, MD  cefUROXime (CEFTIN) 500 MG tablet Take 1 tablet (500 mg total) by mouth 2 (two) times daily. 05/24/13  Yes Stacie Glaze, MD  clotrimazole-betamethasone (LOTRISONE) cream Apply 1 application topically 2 (two) times daily.   Yes Historical Provider, MD  Cyanocobalamin (VITAMIN B-12 PO) Take 1 tablet by mouth daily.   Yes Historical Provider, MD  diclofenac sodium (VOLTAREN) 1 % GEL Apply 2 g topically 4 (four) times daily as needed (pain).   Yes Historical Provider, MD  loperamide (IMODIUM) 2 MG capsule Take 2 mg by mouth 4 (four) times daily as needed for diarrhea or loose stools.   Yes Historical Provider, MD  midodrine (PROAMATINE) 5 MG tablet Take 1 tablet (5 mg total) by mouth 3 (three) times daily with meals. 05/29/13  Yes Leroy Sea, MD  Multiple Vitamin (MULTIVITAMIN WITH MINERALS) TABS tablet Take 1 tablet by mouth daily.   Yes Historical Provider, MD  solifenacin (VESICARE) 5 MG tablet Take 2 tablets (10 mg total) by mouth daily. 05/30/13  Yes Baker Pierini, FNP  Vitamin D, Ergocalciferol, (DRISDOL) 50000 UNITS CAPS Take 50,000 Units by mouth every Sunday.   Yes Historical Provider, MD   Physical Exam: Filed Vitals:   05/31/13 1730  BP: 171/78  Pulse: 66  Temp:   Resp: 18    BP 171/78  Pulse 66  Temp(Src) 98.2 F (36.8 C) (Oral)  Resp 18  SpO2 94%  General Appearance:    Alert, cooperative, no  distress, appears stated age  Head:    Normocephalic, without obvious abnormality, atraumatic           Throat:   Lips, mucosa, and tongue normal;   Neck:   Supple, symmetrical, trachea midline, no adenopathy;    thyroid:  no enlargement/tenderness/nodules; no carotid   bruit or JVD  Back:     Symmetric, no curvature, ROM normal, no CVA tenderness  Lungs:     Clear to auscultation bilaterally, respirations unlabored      Heart:    Regular rate and rhythm, S1 and S2 normal, no murmur, rub   or gallop     Abdomen:     Soft, non-tender, bowel sounds active all four quadrants,    no masses, no organomegaly              Skin:  Skin color, texture, turgor normal, no rashes or lesions  Lymph nodes:   Cervical, supraclavicular, and axillary nodes normal  Neurologic:   CNII-XII intact, normal strength, sensation and reflexes    throughout     Labs on Admission:  Basic Metabolic Panel:  Recent Labs Lab 05/26/13 1750 05/26/13 1758 05/28/13 0425 05/31/13 1647  NA 139 141 140 139  K 4.1 4.1 4.0 3.7  CL 102 103 104 106  CO2 26  --  26  --   GLUCOSE 103* 99 112* 107*  BUN 16 18 14 17   CREATININE 1.10 1.40* 1.10 1.30*  CALCIUM 10.0  --  9.6  --    Liver Function Tests:  Recent Labs Lab 05/26/13 1750  AST 34  ALT 32  ALKPHOS 79  BILITOT 0.5  PROT 7.5  ALBUMIN 4.1   No results found for this basename: LIPASE, AMYLASE,  in the last 168 hours No results found for this basename: AMMONIA,  in the last 168 hours CBC:  Recent Labs Lab 05/26/13 1750 05/26/13 1758 05/28/13 0425 05/31/13 1647  WBC 5.3  --  4.7  --   NEUTROABS 2.9  --   --   --   HGB 13.4 15.0 12.9 14.6  HCT 41.5 44.0 39.2 43.0  MCV 89.1  --  89.5  --   PLT 205  --  163  --    Cardiac Enzymes:  Recent Labs Lab 05/26/13 1750  TROPONINI <0.30    BNP (last 3 results) No results found for this basename: PROBNP,  in the last 8760 hours CBG:  Recent Labs Lab 05/28/13 1544 05/28/13 2033  05/29/13 0748 05/29/13 1119 05/31/13 1632  GLUCAP 115* 101* 95 120* 98    Radiological Exams on Admission: Ct Head (brain) Wo Contrast  05/31/2013   CLINICAL DATA:  Code stroke.  Difficulty speaking  EXAM: CT HEAD WITHOUT CONTRAST  TECHNIQUE: Contiguous axial images were obtained from the base of the skull through the vertex without intravenous contrast.  COMPARISON:  05/27/2013  FINDINGS: There is patchy diffuse low-attenuation within the subcortical and periventricular white matter compatible with chronic microvascular disease.  There is prominence of the sulci and ventricles consistent with brain atrophy. Evolutionary changes of subacute infarct within the left lentiform nucleus and coronal radiata is again noted.  No midline shift, ventriculomegaly, mass effect, evidence of mass lesion, intracranial hemorrhage or evidence of cortically based acute infarction. Gray-white matter differentiation is within normal limits throughout the brain.  The paranasal sinuses and mastoid air cells are clear. The skull is intact.  IMPRESSION: 1. Subacute infarct involving the left lentiform nucleus and coronal radiata. No acute intracranial abnormalities noted.  2. Chronic small vessel ischemic disease and brain atrophy.   Electronically Signed   By: Signa Kell M.D.   On: 05/31/2013 16:43    EKG: Independently reviewed. Sinus rhythm no ST segment changes normal axis.  Assessment/Plan  TIA (transient ischemic attack)/ WEAKNESS/Slurred speech: - Recently discharged from the hospital for a CVA, discuss with neurology we will go ahead and get an MRI and treat her conservatively. Admit her to the hospital under observation.  She is not a candidate for TPA. Consult PT and OT, she passed her swallowing evaluation we'll go ahead and give her diet. We'll continue on Eloquis.  - Long discussion with family about the current management and they seem to understand.  AKI (acute kidney injury): -  There is mostly  prerenal in nature.  Start her on IV  fluids and recheck a basic metabolic panel in the morning.  HYPERLIPIDEMIA: - cont statins   HYPERTENSION: - cont bisoprolol.   Atrial fibrillation: -  Rate controlled. - cont Eloquois.   Code Status: full Family Communication: daughter and husband Disposition Plan: observation  Time spent: 90 minutes  Marinda Elk [facility] Pager [phone]  If 7PM-7AM, please contact night-coverage [email or web address] Password [email or web address] 05/31/2013, 6:06 PM

## 2013-06-01 DIAGNOSIS — G459 Transient cerebral ischemic attack, unspecified: Secondary | ICD-10-CM

## 2013-06-01 DIAGNOSIS — I6350 Cerebral infarction due to unspecified occlusion or stenosis of unspecified cerebral artery: Principal | ICD-10-CM

## 2013-06-01 DIAGNOSIS — R4789 Other speech disturbances: Secondary | ICD-10-CM

## 2013-06-01 LAB — BASIC METABOLIC PANEL
BUN: 16 mg/dL (ref 6–23)
CO2: 23 mEq/L (ref 19–32)
Calcium: 9.8 mg/dL (ref 8.4–10.5)
Chloride: 108 mEq/L (ref 96–112)
Creatinine, Ser: 1.06 mg/dL (ref 0.50–1.10)
GFR calc Af Amer: 55 mL/min — ABNORMAL LOW (ref >90)
Potassium: 5.1 mEq/L (ref 3.5–5.1)

## 2013-06-01 MED ORDER — UNABLE TO FIND: Status: DC

## 2013-06-01 NOTE — Progress Notes (Signed)
   CARE MANAGEMENT NOTE 06/01/2013  Patient:  [name], [name]   Account Number:  [id number]  Date Initiated:  06/01/2013  Documentation initiated by:  Jiles Crocker  Subjective/Objective Assessment:   ADMITTED WITH STROKE     Action/Plan:   CM FOLLOWING FOR DCP   Anticipated DC Date:  06/06/2013   Anticipated DC Plan:  POSSIBLY HOME W HOME HEALTH SERVICES; AWAITING ON PT/OT EVALS    DC Planning Services  CM consult           Status of service:  In process, will continue to follow Medicare Important Message given?  NA - LOS <3 / Initial given by admissions (If response is "NO", the following Medicare IM given date fields will be blank)  Per UR Regulation:  Reviewed for med. necessity/level of care/duration of stay  Comments:  10/30/2014Abelino Derrick RN,BSN,MHA [phone]

## 2013-06-01 NOTE — Progress Notes (Signed)
Received call from Attending MD to arrange outpatient therapies for the patient at discharge. Talked to patient/spouse about Outpatient physical therapy, patient is agreeable; [facility]; the rehab facility is to contact the patient at home and arrange a date and time for start up therapies; Alexis Goodell [phone]

## 2013-06-01 NOTE — Progress Notes (Signed)
Patient was talking on the phone with her daughter, the daughter noticed that  Patient had difficulty finding words.  The daughter spoke  to RN about this and on assessment patient has mild expressive aphasia.  Her speech was back to her baseline after 2 to 3 minutes.  Sian

## 2013-06-01 NOTE — Progress Notes (Addendum)
Stroke Team Progress Note  HISTORY [name] is a 79 y.o. female with a recent infarct who presents with recurrent aphasia 05/31/2013. Around 3pm she had difficulty getting her words out and subsequently could not speak at all. She was brought in by EMS, initially on exam, she did have a significant expressive aphasia, however her symptoms improved markedly during the exam. Patient was not a TPA candidate secondary to recent stroke within 90 days. She was admitted  for further evaluation and treatment.  SUBJECTIVE Her family is at the bedside.  Overall she feels her condition is improved.   OBJECTIVE Most recent Vital Signs: Filed Vitals:   06/01/13 0200 06/01/13 0400 06/01/13 0600 06/01/13 0755  BP: 145/73 132/69 135/71 146/79  Pulse: 71 69 72 66  Temp: 98.2 F (36.8 C) 98.6 F (37 C) 98.3 F (36.8 C) 98.5 F (36.9 C)  TempSrc:    Oral  Resp: 16 16 16 20   Height:      Weight:   84.8 kg (186 lb 15.2 oz)   SpO2: 93% 94% 94% 94%   CBG (last 3)   Recent Labs  05/29/13 1119 05/31/13 1632  GLUCAP 120* 98    IV Fluid Intake:   . sodium chloride 1,000 mL (05/31/13 2138)    MEDICATIONS  . apixaban  5 mg Oral BID  . bisoprolol  5 mg Oral Daily  . darifenacin  7.5 mg Oral Daily  . sodium chloride  3 mL Intravenous Q12H  . [START ON 06/04/2013] Vitamin D (Ergocalciferol)  50,000 Units Oral Q Sun   PRN:  ALPRAZolam, ALPRAZolam, HYDROcodone-acetaminophen, loperamide, ondansetron (ZOFRAN) IV, ondansetron, polyethylene glycol  Diet:  Cardiac thin liquids Activity:  Up with assistance DVT Prophylaxis:  apixaban  CLINICALLY SIGNIFICANT STUDIES Basic Metabolic Panel:  Recent Labs Lab 05/28/13 0425 05/31/13 1647 05/31/13 2243 06/01/13 0545  NA 140 139  --  143  K 4.0 3.7  --  5.1  CL 104 106  --  108  CO2 26  --   --  23  GLUCOSE 112* 107*  --  100*  BUN 14 17  --  16  CREATININE 1.10 1.30* 1.13* 1.06  CALCIUM 9.6  --   --  9.8   Liver Function Tests:  Recent  Labs Lab 05/26/13 1750  AST 34  ALT 32  ALKPHOS 79  BILITOT 0.5  PROT 7.5  ALBUMIN 4.1   CBC:  Recent Labs Lab 05/26/13 1750  05/28/13 0425 05/31/13 1647 05/31/13 2243  WBC 5.3  --  4.7  --  6.3  NEUTROABS 2.9  --   --   --   --   HGB 13.4  < > 12.9 14.6 12.9  HCT 41.5  < > 39.2 43.0 39.6  MCV 89.1  --  89.5  --  89.4  PLT 205  --  163  --  181  < > = values in this interval not displayed. Coagulation:  Recent Labs Lab 05/26/13 1750  LABPROT 17.1*  INR 1.43   Cardiac Enzymes:  Recent Labs Lab 05/26/13 1750  TROPONINI <0.30   Urinalysis:  Recent Labs Lab 05/26/13 1817 05/27/13 1230  COLORURINE YELLOW YELLOW  LABSPEC 1.014 1.024  PHURINE 6.5 7.0  GLUCOSEU NEGATIVE NEGATIVE  HGBUR NEGATIVE NEGATIVE  BILIRUBINUR NEGATIVE NEGATIVE  KETONESUR NEGATIVE NEGATIVE  PROTEINUR NEGATIVE NEGATIVE  UROBILINOGEN 0.2 0.2  NITRITE NEGATIVE NEGATIVE  LEUKOCYTESUR TRACE* NEGATIVE   Lipid Panel    Component Value Date/Time   CHOL  163 05/27/2013 0425   TRIG 176* 05/27/2013 0425   HDL 31* 05/27/2013 0425   CHOLHDL 5.3 05/27/2013 0425   VLDL 35 05/27/2013 0425   LDLCALC 97 05/27/2013 0425   HgbA1C  Lab Results  Component Value Date   HGBA1C 5.9* 05/27/2013    Urine Drug Screen:     Component Value Date/Time   LABOPIA NONE DETECTED 05/26/2013 1817   COCAINSCRNUR NONE DETECTED 05/26/2013 1817   LABBENZ NONE DETECTED 05/26/2013 1817   AMPHETMU NONE DETECTED 05/26/2013 1817   THCU NONE DETECTED 05/26/2013 1817   LABBARB NONE DETECTED 05/26/2013 1817    Alcohol Level:  Recent Labs Lab 05/26/13 1750  ETH <11    CT of the brain  05/31/2013    1. Subacute infarct involving the left lentiform nucleus and coronal radiata. No acute intracranial abnormalities noted.  2. Chronic small vessel ischemic disease and brain atrophy.   MRI of the brain  05/31/2013  1. New punctate nonhemorrhagic infarct involving the posterior left coronal radiata. 2. The acute infarct of  the left lentiform nucleus and coronal radiata is otherwise stable. 3. Atrophy and diffuse white matter disease is also stable. 4. Moderate cervical spondylosis.  MRA of the brain   2D Echocardiogram   Carotid Doppler    CXR    EKG  unchanged from previous tracings, normal sinus rhythm. Probable anteroseptal infarct, old  Prior admission Results CT Head 05/26/13 There is no hemorrhage or extra-axial fluid. There is no evidence of mass or hydrocephalus. There is mild to moderate atrophy and there is moderate diffuse bilateral deep white matter low attenuation. Lacunar infarct left basal ganglia measuring about 1 cm, stable. No acute vascular territory infarct. Calvarium intact.  CTA of the brain 05/26/13 1. Atrophy and diffuse white matter disease without evidence for an acute infarct. 2. Atherosclerotic changes of the cavernous carotid arteries without significant stenoses.  CTA of the neck 05/26/13 1. Atherosclerotic irregularity within the distal common carotid arteries bilaterally with medial deviation. 2. Focal 50% stenosis of the mid right cervical ICA without other significant stenoses. 3. Mild ectasia of the dominant left vertebral artery.  MRI of the brain 05/27/2013 1. Acute non hemorrhagic infarct of the left lentiform nucleus and corona radiata measures 13 mm maximally. 2. Atrophy and diffuse white matter disease compatible with chronic microvascular ischemia.  MRA of the brain See CTA head  2D Echocardiogram EF 50-55% with no source of embolus.  Carotid Doppler See CTA neck  Therapy Recommendations outpatient PT  Physical Exam   Mental Status:  Patient is awake, alert, oriented to person, place, month, year, and situation.  Immediate and remote memory are intact.  Patient is able to give a clear and coherent history.  No signs of neglect . Speech is fluent without aphasia or dysarthria She has mild difficulty with repetition and reading.  Cranial Nerves:  II: Visual Fields  are full. Pupils are equal, round, and reactive to light. Discs are difficult to visualize.  III,IV, VI: EOMI without ptosis or diploplia.  V: Facial sensation is symmetric to temperature  VII: Facial movement is symmetric.  VIII: hearing is intact to voice  X: Uvula elevates symmetrically  XI: Shoulder shrug is symmetric.  XII: tongue is midline without atrophy or fasciculations.  Motor:  Tone is normal. Bulk is normal. 5/5 strength was present in all four extremities.  Sensory:  Sensation is symmetric to light touch and pin in the arms and legs.  Deep Tendon Reflexes:  2+  and symmetric in the biceps and patellae.  Cerebellar:  FNF and HKS are without dysmetria, but mild tremor on FNF bilaterally  Gait:  Not assessed due to acute nature of evaluation    ASSESSMENT [name] is a 79 y.o. female presenting with worsening aphasia. Imaging confirms a new punctate left corona radiata infarct. Infarct felt to be embolic secondary to afib.  On eliquis prior to admission (had been on pradaxa prior to that). Now on eliquis for secondary stroke prevention. Patient with resultant transient aphasia. Work up underway.  A fib  Hyperlipidemia per hx, LDL 97, on no lipid lowering medication PTA HTN  PVD  Hx stroke March 2004, October 2014 Hx TIAs  Family hx ot stroke (mother)  Baseline mild dementia based on interaction today - she does not remember refusing PT yesterday, which she did twice, she covers the memory deficit with joking  Hospital day # 1  TREATMENT/PLAN  Continue eliquis 5mg  twice a day for secondary stroke prevention. Patients imaging corresponds to similar area of previous infarct and is felt to represent same infarct changes from recent admission. No change in treatment.  Have patient keep follow up with Dr. Pearlean Brownie in 2 months in stroke clinic as previously requested.  Dr. Pearlean Brownie discussed plan of care with patient and with Dr. Waymon Amato.  Gwendolyn Lima. Manson Passey, [facility],  MBA, MHA [facility] Pager: [phone] 06/01/2013 4:02 PM  I have personally obtained a history, examined the patient, evaluated imaging results, and formulated the assessment and plan of care. I agree with the above. Delia Heady, MD

## 2013-06-01 NOTE — ED Provider Notes (Signed)
I saw and evaluated the patient, reviewed the resident's note and I agree with the findings and plan.  EKG Interpretation     Ventricular Rate:  69 PR Interval:  197 QRS Duration: 84 QT Interval:  408 QTC Calculation: 437 R Axis:   65 Text Interpretation:  Sinus rhythm Probable anteroseptal infarct, old No significant change since last tracing              Shon Baton, MD 06/01/13 [phone]

## 2013-06-01 NOTE — Evaluation (Signed)
Physical Therapy Evaluation Patient Details Name: [name] MRN: [id number] DOB: 1927-08-23 Today's Date: 06/01/2013 Time: 6578-4696 PT Time Calculation (min): 28 min  PT Assessment / Plan / Recommendation History of Present Illness  pt presents with slurred speech and recent hx of CVA.    Clinical Impression  Pt generally unsteady and was beginning OPPT prior to last CVA.  Would benefit from returning to OPPT for continued work on strength and balance.  Also pt will need Speech eval.  Pt noting having word finding difficulties and having difficulty problem solving how to dial phone.  Left text page with Neuro PA.      PT Assessment  Patient needs continued PT services    Follow Up Recommendations  Outpatient PT;Supervision for mobility/OOB    Does the patient have the potential to tolerate intense rehabilitation      Barriers to Discharge        Equipment Recommendations  None recommended by PT    Recommendations for Other Services Speech consult   Frequency Min 4X/week    Precautions / Restrictions Precautions Precautions: Fall Restrictions Weight Bearing Restrictions: No   Pertinent Vitals/Pain Denied pain.        Mobility  Bed Mobility Bed Mobility: Supine to Sit;Sitting - Scoot to Edge of Bed Supine to Sit: 6: Modified independent (Device/Increase time) Sitting - Scoot to Edge of Bed: 6: Modified independent (Device/Increase time) Details for Bed Mobility Assistance: pt moves slowly, but able to complete without A.   Transfers Transfers: Sit to Stand;Stand to Sit Sit to Stand: 6: Modified independent (Device/Increase time);With upper extremity assist;From bed Stand to Sit: 6: Modified independent (Device/Increase time);With upper extremity assist;To bed Details for Transfer Assistance: pt utilizes UEs for A, but demos good technique.   Ambulation/Gait Ambulation/Gait Assistance: 5: Supervision Ambulation Distance (Feet): 160 Feet Assistive device:  None Ambulation/Gait Assistance Details: pt mildly unsteady and tends to lean to R side.  pt indicates difficulty with Bil TKA and had just started OPPT for this prior to last CVA.   Gait Pattern: Step-through pattern;Decreased stride length;Lateral trunk lean to right Stairs: No Wheelchair Mobility Wheelchair Mobility: No Modified Rankin (Stroke Patients Only) Pre-Morbid Rankin Score: No significant disability Modified Rankin: Slight disability    Exercises     PT Diagnosis: Difficulty walking  PT Problem List: Decreased strength;Decreased activity tolerance;Decreased balance;Decreased mobility;Decreased knowledge of use of DME PT Treatment Interventions: DME instruction;Gait training;Stair training;Functional mobility training;Therapeutic activities;Therapeutic exercise;Balance training;Neuromuscular re-education;Patient/family education     PT Goals(Current goals can be found in the care plan section) Acute Rehab PT Goals Patient Stated Goal: To feel better.  To go home soon. PT Goal Formulation: With patient Time For Goal Achievement: 06/08/13 Potential to Achieve Goals: Good  Visit Information  Last PT Received On: 06/01/13 Assistance Needed: +1 History of Present Illness: pt presents with slurred speech and recent hx of CVA.         Prior Functioning  Home Living Family/patient expects to be discharged to:: Private residence Living Arrangements: Spouse/significant other Available Help at Discharge: Family;Available 24 hours/day Type of Home: House Home Access: Level entry Home Layout: Multi-level Alternate Level Stairs-Number of Steps: flight Alternate Level Stairs-Rails: Right Home Equipment: Walker - 2 wheels;Bedside commode Prior Function Level of Independence: Needs assistance ADL's / Homemaking Assistance Needed: Husband performs all homemaking.   Comments: Patient had just resumed OP PT for knees Communication Communication: Expressive difficulties     Cognition  Cognition Arousal/Alertness: Awake/alert Behavior During Therapy: WFL for  tasks assessed/performed Overall Cognitive Status: Difficult to assess General Comments: pt oriented and follows all directions, however having difficulty using phone.  pt is able to tell this PT her phone number, but was unable to dial phone.  Requested Speech consult.   Difficult to assess due to: Impaired communication    Extremity/Trunk Assessment Upper Extremity Assessment Upper Extremity Assessment: Defer to OT evaluation Lower Extremity Assessment Lower Extremity Assessment: Generalized weakness   Balance Balance Balance Assessed: Yes Static Standing Balance Static Standing - Balance Support: No upper extremity supported Static Standing - Level of Assistance: 5: Stand by assistance  End of Session PT - End of Session Equipment Utilized During Treatment: Gait belt Activity Tolerance: Patient tolerated treatment well Patient left: in bed;with call bell/phone within reach Nurse Communication: Mobility status  GP     Sunny Schlein, [place] [phone] 06/01/2013, 12:16 PM

## 2013-06-01 NOTE — Telephone Encounter (Signed)
Readmitted to hosptial

## 2013-06-01 NOTE — Progress Notes (Signed)
IV discontinued and discharge paperwork and education complete.  Patient going home with outpatient PT.  Lance Bosch, RN

## 2013-06-01 NOTE — Evaluation (Signed)
Speech Language Pathology Evaluation Patient Details Name: [name] MRN: [id number] DOB: 27-Aug-1927 Today's Date: 06/01/2013 Time: 3086-5784 SLP Time Calculation (min): 20 min  Problem List:  Patient Active Problem List   Diagnosis Date Noted  . Slurred speech 05/31/2013  . TIA (transient ischemic attack) 05/31/2013  . AKI (acute kidney injury) 05/31/2013  . Aphasia 05/26/2013  . Bilateral hand pain 01/09/2013  . Fever 03/27/2012  . Dehydration 03/27/2012  . Large B-cell lymphoma 02/18/2012  . Hemorrhage of rectum and anus 01/19/2012  . Family history of malignant neoplasm of gastrointestinal tract 01/19/2012  . Bursitis of shoulder, right, adhesive 10/30/2010  . ABDOMINAL INCISIONAL HERNIA 04/03/2010  . KNEE JOINT REPLACEMENT BY OTHER MEANS 02/21/2010  . CHEST PAIN 12/23/2009  . DIARRHEA 09/11/2009  . NECK AND BACK PAIN 03/18/2009  . VARICOSE VEINS LOWER EXTREMITIES W/INFLAMMATION 01/15/2009  . EDEMA 01/15/2009  . Atrial fibrillation 12/14/2008  . ANEMIA-NOS 10/20/2008  . ANXIETY 10/20/2008  . DEPRESSION 10/20/2008  . PERIPHERAL VASCULAR DISEASE 10/20/2008  . WEAKNESS 10/20/2008  . OTHER DYSPHAGIA 10/20/2008  . DIVERTICULITIS OF COLON WITH HEMORRHAGE 07/24/2008  . FISTULA OF INTESTINE EXCLUDING RECTUM AND ANUS 07/10/2008  . PEPTIC ULCER DISEASE 07/06/2008  . DYSPNEA 07/06/2008  . LOC OSTEOARTHROS NOT SPEC WHETHER PRIM/SEC HAND 04/03/2008  . COPD 03/26/2008  . HAIR LOSS 10/06/2007  . GERD 05/03/2007  . OSTEOARTHROSIS, LOCAL NOS, LOWER LEG 05/03/2007  . OSTEOARTHRITIS 05/03/2007  . HYPERLIPIDEMIA 03/18/2007  . HYPERTENSION 03/18/2007   Past Medical History:  Past Medical History  Diagnosis Date  . GERD (gastroesophageal reflux disease)   . History of TIAs   . Diverticulosis   . PUD (peptic ulcer disease)   . PVD (peripheral vascular disease)   . Hyperlipidemia   . Anxiety   . Depression   . Blood transfusion     "w/1st miscarriage" (05/26/2013)  .  COPD (chronic obstructive pulmonary disease)   . Hypertension   . PONV (postoperative nausea and vomiting)   . Family history of anesthesia complication     "sisters also get sick as a dog" (05/26/2013)  . Coronary artery disease   . Heart murmur     "when I was a child" (05/26/2013)  . DVT (deep venous thrombosis) 1954    "after I had my first child" (05/26/2013)  . Pneumonia 2013    "once" (05/26/2013)  . Exertional shortness of breath   . H/O hiatal hernia   . Stroke 10/2002    no residual  . Stroke 05/26/2013    "very mild; affected my speech for a while" (05/26/2013)  . Arthritis     "lots; hands" (05/26/2013)  . 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 Lymphona, mantle cell, inguinal region/lower limb     "large c-cell" (05/26/2013)   Past Surgical History:  Past Surgical History  Procedure Laterality Date  . 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 Tubal ligation  1972  . Radical neck dissection  1986    "larynx cancer" (05/26/2013)  . Total knee arthroplasty Bilateral     2002 left 2008 right  . Ileostomy closure  01/2010  . Cataract extraction  2005    bilateral  . Carotid endarterectomy Bilateral 2004    2004 a month apart right and left  . Tonsillectomy and adenoidectomy  1936    "tonsils grew back; no 2nd OR" (05/26/2013)  . Hernia repair  05/2010  ventral hernia repair  . Carpal tunnel release Left ?1960's  . Abdominal exploration surgery  12/01/2012  . Appendectomy  09/13/2008  . Colon surgery  09/13/2008    Laparoscopic assisted converted to open sigmoid colectomy.Hattie Perch 09/13/2008 (05/26/2013)  . Cataract extraction w/ intraocular lens  implant, bilateral Bilateral ~ 2003  . Dilation and curettage of uterus  1950's    "had 2 for miscarriages" (05/26/2013)   HPI:  [name] is a 79 y.o. female with a recent infarct who presents with recurrent  aphasia 05/31/2013. Around 3pm she had difficulty getting her words out and subsequently could not speak at all. She was brought in by EMS, initially on exam, she did have a significant expressive aphasia, however her symptoms improved markedly during the exam. Patient was not a TPA candidate secondary to recent stroke within 90 days. She was admitted for further evaluation and treatment.  Imaging on 05/31/2013 revealed new punctate nonhemorrhagic infarct involving the posterior left coronal radiate; moderate cervical spondylosis; acute infarct of the left leniform nucleus and coronoal radiata, in addition to atrophy and diffuse white matter disease stable.  Cognitive-linguistic evaluation requested and completed on 06/01/2013.   Assessment / Plan / Recommendation Clinical Impression  Cognitive-linguistic evaluation completed on 06/01/2013.  Patient presents with mild expressive aphasia and suspected mild cognitive impairments.  Patient's expressive aphasia is characterized by word finding difficulties; patient reports moments of difficulty to be very frustrating for her.  Cognitively, SLP suspects memory impairments and higher level cognitive functioning is recommended to be assessed in the outpatient setting.  Patient also demonstrated difficulty dividing attention while walking and talking with OT, which was likely impacted by her word finding difficulties in general.  SLP educated patient about word finding strategies and included family in discussion regarding services after discharge from this level of care.  It is recommended that patient receive skilled SLP intervention in the outpatient setting focusing on aphasia treatment and higher level cognitive assessment to maximize her functional independence at home.  Patient was discharged from the hospital at the end of the session.    SLP Assessment  All further Speech Lanaguage Pathology  needs can be addressed in the next venue of care    Follow Up  Recommendations Outpatient SLP       Pertinent Vitals/Pain None    SLP Evaluation Prior Functioning  Cognitive/Linguistic Baseline: Within functional limits Type of Home: House  Lives With: Spouse Available Help at Discharge: Family;Available 24 hours/day Vocation: Retired   IT consultant  Overall Cognitive Status: Impaired/Different from baseline Arousal/Alertness: Awake/alert Orientation Level: Oriented X4 Attention: Focused;Sustained;Divided Focused Attention: Appears intact Sustained Attention: Appears intact Divided Attention: Impaired Divided Attention Impairment: Verbal basic;Functional basic Memory: Impaired Memory Impairment: Decreased recall of new information Awareness: Appears intact Safety/Judgment: Appears intact    Comprehension  Auditory Comprehension Overall Auditory Comprehension: Appears within functional limits for tasks assessed Conversation: Simple    Expression Expression Primary Mode of Expression: Verbal Verbal Expression Overall Verbal Expression: Impaired Initiation: No impairment Level of Generative/Spontaneous Verbalization: Sentence;Conversation Naming: Impairment (Expressive aphasia) Verbal Errors: Aware of errors;Semantic paraphasias;Perseveration Pragmatics: No impairment Non-Verbal Means of Communication: Not applicable Other Verbal Expression Comments: Overall, expressive aphasia   Oral / Motor Oral Motor/Sensory Function Overall Oral Motor/Sensory Function: Appears within functional limits for tasks assessed Motor Speech Overall Motor Speech: Impaired at baseline Respiration: Within functional limits Phonation: Breathy;Hoarse;Other (comment) (History of laryngeal cancer) Resonance: Within functional limits Articulation: Within functional limitis Intelligibility: Intelligible Motor Planning: Witnin functional limits  Desma Mcgregor E 06/01/2013, 4:41 PM

## 2013-06-01 NOTE — Evaluation (Signed)
The assessment and plan has been reviewed and SLP is in agreement. Ediberto Sens, M.A., CCC-SLP [phone]  

## 2013-06-01 NOTE — Evaluation (Signed)
Occupational Therapy Evaluation Patient Details Name: [name] MRN: [id number] DOB: 1927-10-28 Today's Date: 06/01/2013 Time: 7829-5621 OT Time Calculation (min): 35 min  OT Assessment / Plan / Recommendation History of present illness pt presents with slurred speech and recent hx of CVA.     Clinical Impression   Pt overall at a supervision level for selfcare tasks and functional transfers.  She was able to follow multiple step commands related to selfcare tasks without difficulty.  Only noted issue was her balance with mobility, where she did demonstrate a slight bias to the right.  Feel this could definately be attributable to her recent stroke but pt also has a history of bilateral TKA.  Feel she will benefit from outpatient PT for balance but otherwise no OT needs at this time.      OT Assessment  Patient does not need any further OT services    Follow Up Recommendations  No OT follow up       Equipment Recommendations  None recommended by OT          Precautions / Restrictions Precautions Precautions: Fall Restrictions Weight Bearing Restrictions: No   Pertinent Vitals/Pain No report of pain and O2 sats 93% on room air during session    ADL  Eating/Feeding: Simulated;Independent Where Assessed - Eating/Feeding: Chair Grooming: Performed;Independent Where Assessed - Grooming: Unsupported standing Upper Body Bathing: Simulated;Set up Where Assessed - Upper Body Bathing: Unsupported sitting Lower Body Bathing: Simulated;Supervision/safety Where Assessed - Lower Body Bathing: Supported sit to stand Upper Body Dressing: Simulated;Set up Where Assessed - Upper Body Dressing: Unsupported sitting Lower Body Dressing: Performed;Supervision/safety Where Assessed - Lower Body Dressing: Supported sit to stand Toilet Transfer: Research scientist (life sciences) Method: Other (comment) (ambulate without assistive device) Acupuncturist: Comfort  height toilet Toileting - Clothing Manipulation and Hygiene: Performed;Supervision/safety Where Assessed - Engineer, mining and Hygiene: Sit to stand from 3-in-1 or toilet Tub/Shower Transfer Method: Not assessed Equipment Used: Gait belt Transfers/Ambulation Related to ADLs: Pt is overall close supervision for mobility during selfcare tasks without use of assistive device.  Pt had a couple episodes of getting too close to the wall on the left side and hitting it while walking.   ADL Comments: Pt is overall supervision level for selfcare tasks and functional transfers at this time.  Occasional LOB to the right with mobility, when dividing attention between cognitive task.  Recommended use of the shower seat with at home for safety but other wise no OT related issues noted.  She was able to remember and follow 5 step commnand without difficult.       Visit Information  Last OT Received On: 06/01/13 Assistance Needed: +1 History of Present Illness: pt presents with slurred speech and recent hx of CVA.         Prior Functioning     Home Living Family/patient expects to be discharged to:: Private residence Living Arrangements: Spouse/significant other Available Help at Discharge: Family;Available 24 hours/day Type of Home: House Home Access: Level entry Home Layout: Multi-level Alternate Level Stairs-Number of Steps: flight Alternate Level Stairs-Rails: Right Home Equipment: Walker - 2 wheels;Bedside commode;Shower seat Prior Function Level of Independence: Needs assistance ADL's / Homemaking Assistance Needed: Pt does the cooking and her husband washes the dishes per her report. Comments: Patient had just resumed OP PT for knees Communication Communication: Expressive difficulties         Vision/Perception Vision - History Baseline Vision: No visual deficits Patient Visual Report: No change from  baseline Vision - Assessment Eye Alignment: Within Functional  Limits Vision Assessment: Vision not tested Perception Perception: Within Functional Limits Praxis Praxis: Intact   Cognition  Cognition Arousal/Alertness: Awake/alert Behavior During Therapy: WFL for tasks assessed/performed Overall Cognitive Status: Within Functional Limits for tasks assessed General Comments: Pt with 2 occasions of word finding difficulty but otherwise speech soft at times.  She was able to dial 2 numbers given on the phone with slight increased time.    Extremity/Trunk Assessment Upper Extremity Assessment Upper Extremity Assessment: Overall WFL for tasks assessed Lower Extremity Assessment Lower Extremity Assessment: Defer to PT evaluation Cervical / Trunk Assessment Cervical / Trunk Assessment: Normal     Mobility Transfers Transfers: Sit to Stand Sit to Stand: 6: Modified independent (Device/Increase time);With upper extremity assist Stand to Sit: With upper extremity assist;6: Modified independent (Device/Increase time)        Balance Balance Balance Assessed: Yes Static Standing Balance Static Standing - Balance Support: No upper extremity supported Static Standing - Level of Assistance: 5: Stand by assistance Dynamic Standing Balance Dynamic Standing - Balance Support: No upper extremity supported Dynamic Standing - Level of Assistance: 5: Stand by assistance (during selfcare tasks)   End of Session OT - End of Session Equipment Utilized During Treatment: Gait belt Activity Tolerance: Patient tolerated treatment well Patient left: in chair;with family/visitor present;Other (comment) (with speech therapist)      Aryahi Denzler OTR/L 06/01/2013, 4:10 PM

## 2013-06-01 NOTE — Discharge Summary (Signed)
Physician Discharge Summary  [name]:[id number] DOB: 12/17/1927 DOA: 05/31/2013  PCP: [name], MD  Admit date: 05/31/2013 Discharge date: 06/01/2013  Time spent: Less than 30 minutes  Recommendations for Outpatient Follow-up:  1. Dr. Darryll Capers, in 1 week. 2. Dr. Delia Heady, Neurology in 2 weeks. 3. Out Patient PT.  Discharge Diagnoses:  Principal Problem:   AKI (acute kidney injury) Active Problems:   HYPERLIPIDEMIA   HYPERTENSION   Atrial fibrillation   WEAKNESS   Slurred speech   TIA (transient ischemic attack)   Discharge Condition: Improved & Stable  Diet recommendation: Heart Healthy diet.  Filed Weights   05/31/13 2031 06/01/13 0600  Weight: 87.091 kg (192 lb) 84.8 kg (186 lb 15.2 oz)    History of present illness:  Past medical history of hypertension, previous stroke with her most recent one on 05/28/2013 recently discharged from hospital that comes in for slurred speech that started on the day of admission at 2 PM. She also relates some right lower extremity weakness and heaviness. She relates some unbalance which has resolved She relates that when she got to the emergency room her symptoms resolved. A code stroke was called by the ED and further canceled by the neurologist. Recommended admission for observation.  Hospital Course:   Recurrent CVA - Patient was recently discharged from the hospital at which time she had CVA and completed stroke workup. - MRI during this admission shows new punctate nonhemorrhagic infarct involving the posterior left coronal radiata. - Neurology consulted. Stroke M.D. evaluated patient today and has cleared her for discharge without any changes to her PTA medications. - Physical therapy recommends outpatient PT.  - Slurred speech/aphasia has improved.   Mild AKI - resolved after IVF  Hyperlipidemia  - Continue statins   Hypertension - Continue bisoprolol. Controlled  Atrial fibrillation -  Rate controlled - Continue Eliquis  Consultations:  Neurology   Procedures:  None    Discharge Exam:  Complaints:  Patient states that she feels better. She states that her speech is almost back to usual as prior to acute events of yesterday.   Filed Vitals:   06/01/13 0755 06/01/13 0945 06/01/13 1330 06/01/13 1500  BP: 146/79 128/103 135/80 153/76  Pulse: 66 71 72 71  Temp: 98.5 F (36.9 C) 98.4 F (36.9 C) 98.4 F (36.9 C) 98.6 F (37 C)  TempSrc: Oral Oral Oral Oral  Resp: 20 20 20 18   Height:      Weight:      SpO2: 94% 93% 93% 92%    General exam: Pleasant elderly female who is sitting up on chair in no obvious distress.  Respiratory system: Clear. No increased work of breathing. Cardiovascular system: S1 & S2 heard,  irregularly irregular . No JVD, murmurs, gallops, clicks or pedal edema. Gastrointestinal system: Abdomen is nondistended, soft and nontender. Normal bowel sounds heard. Central nervous system: Alert and oriented. Mild? Expressive aphasia. No facial asymmetry.  Extremities: Symmetric 5 x 5 power.  Discharge Instructions      Discharge Orders   Future Appointments Provider Department Dept Phone   08/10/2013 9:45 AM Rana Snare, NP [facility] MEDICAL ONCOLOGY [phone]   09/11/2013 11:30 AM Stacie Glaze, MD [facility] [phone]   Future Orders Complete By Expires   Call MD for:  As directed    Comments:     Stroke like symptoms.   Diet - low sodium heart healthy  As directed  Increase activity slowly  As directed        Medication List         ALPRAZolam 0.5 MG tablet  Commonly known as:  XANAX  Take 1 tablet (0.5 mg total) by mouth at bedtime as needed for sleep.     apixaban 2.5 MG Tabs tablet  Commonly known as:  ELIQUIS  Take 1 tablet (2.5 mg total) by mouth 2 (two) times daily.     bisoprolol 5 MG tablet  Commonly known as:  ZEBETA  Take 5 mg by mouth daily at 12 noon.      cefUROXime 500 MG tablet  Commonly known as:  CEFTIN  Take 1 tablet (500 mg total) by mouth 2 (two) times daily.     clotrimazole-betamethasone cream  Commonly known as:  LOTRISONE  Apply 1 application topically 2 (two) times daily.     diclofenac sodium 1 % Gel  Commonly known as:  VOLTAREN  Apply 2 g topically 4 (four) times daily as needed (pain).     loperamide 2 MG capsule  Commonly known as:  IMODIUM  Take 2 mg by mouth 4 (four) times daily as needed for diarrhea or loose stools.     midodrine 5 MG tablet  Commonly known as:  PROAMATINE  Take 1 tablet (5 mg total) by mouth 3 (three) times daily with meals.     multivitamin with minerals Tabs tablet  Take 1 tablet by mouth daily.     solifenacin 5 MG tablet  Commonly known as:  VESICARE  Take 2 tablets (10 mg total) by mouth daily.     VITAMIN B-12 PO  Take 1 tablet by mouth daily.     Vitamin D (Ergocalciferol) 50000 UNITS Caps capsule  Commonly known as:  DRISDOL  Take 50,000 Units by mouth every Sunday.       Follow-up Information   Follow up with [name], MD. Schedule an appointment as soon as possible for a visit in 1 week.   Specialty:  Internal Medicine   Contact information:   [address] [place] [address] [phone]       Follow up with Gates Rigg, MD. Schedule an appointment as soon as possible for a visit in 2 weeks.   Specialties:  Neurology, Radiology   Contact information:   [address] [place] [address] [phone]        The results of significant diagnostics from this hospitalization (including imaging, microbiology, ancillary and laboratory) are listed below for reference.    Significant Diagnostic Studies: Ct Head (brain) Wo Contrast  05/31/2013   CLINICAL DATA:  Code stroke.  Difficulty speaking  EXAM: CT HEAD WITHOUT CONTRAST  TECHNIQUE: Contiguous axial images were obtained from the base of the skull through the vertex  without intravenous contrast.  COMPARISON:  05/27/2013  FINDINGS: There is patchy diffuse low-attenuation within the subcortical and periventricular white matter compatible with chronic microvascular disease.  There is prominence of the sulci and ventricles consistent with brain atrophy. Evolutionary changes of subacute infarct within the left lentiform nucleus and coronal radiata is again noted.  No midline shift, ventriculomegaly, mass effect, evidence of mass lesion, intracranial hemorrhage or evidence of cortically based acute infarction. Gray-white matter differentiation is within normal limits throughout the brain.  The paranasal sinuses and mastoid air cells are clear. The skull is intact.  IMPRESSION: 1. Subacute infarct involving the left lentiform nucleus and coronal radiata. No acute intracranial abnormalities noted.  2. Chronic small vessel ischemic disease and brain atrophy.   Electronically Signed   By: Signa Kell M.D.   On: 05/31/2013 16:43   Mr Brain Wo Contrast  05/31/2013   CLINICAL DATA:  Code stroke. Recent infarcts 4 days ago. New onset of slurred speech today. Right lower extremity weakness and heaviness.  EXAM: MRI HEAD WITHOUT CONTRAST  TECHNIQUE: Multiplanar, multisequence MR imaging was performed. No intravenous contrast was administered.  COMPARISON:  MRI brain 05/27/2013  FINDINGS: The previously-seen acute infarct involving the right lentiform nucleus and coronal radiata is stable. There is a new punctate infarct involving the more posterior left coronal radiata. Atrophy and diffuse white matter disease is otherwise stable. No hemorrhage or mass lesion is present. The ventricles are proportionate to the degree of atrophy. Flow is present in the major intracranial arteries. The patient is status post bilateral lens extractions. The paranasal sinuses and mastoid air cells are clear. The degenerative changes of the upper cervical spine are again noted.  IMPRESSION: 1. New punctate  nonhemorrhagic infarct involving the posterior left coronal radiata. 2. The acute infarct of the left lentiform nucleus and coronal radiata is otherwise stable. 3. Atrophy and diffuse white matter disease is also stable. 4. Moderate cervical spondylosis.   Electronically Signed   By: Gennette Pac M.D.   On: 05/31/2013 20:21    Microbiology: No results found for this or any previous visit (from the past 240 hour(s)).   Labs: Basic Metabolic Panel:  Recent Labs Lab 05/26/13 1750 05/26/13 1758 05/28/13 0425 05/31/13 1647 05/31/13 2243 06/01/13 0545  NA 139 141 140 139  --  143  K 4.1 4.1 4.0 3.7  --  5.1  CL 102 103 104 106  --  108  CO2 26  --  26  --   --  23  GLUCOSE 103* 99 112* 107*  --  100*  BUN 16 18 14 17   --  16  CREATININE 1.10 1.40* 1.10 1.30* 1.13* 1.06  CALCIUM 10.0  --  9.6  --   --  9.8   Liver Function Tests:  Recent Labs Lab 05/26/13 1750  AST 34  ALT 32  ALKPHOS 79  BILITOT 0.5  PROT 7.5  ALBUMIN 4.1   No results found for this basename: LIPASE, AMYLASE,  in the last 168 hours No results found for this basename: AMMONIA,  in the last 168 hours CBC:  Recent Labs Lab 05/26/13 1750 05/26/13 1758 05/28/13 0425 05/31/13 1647 05/31/13 2243  WBC 5.3  --  4.7  --  6.3  NEUTROABS 2.9  --   --   --   --   HGB 13.4 15.0 12.9 14.6 12.9  HCT 41.5 44.0 39.2 43.0 39.6  MCV 89.1  --  89.5  --  89.4  PLT 205  --  163  --  181   Cardiac Enzymes:  Recent Labs Lab 05/26/13 1750  TROPONINI <0.30   BNP: BNP (last 3 results) No results found for this basename: PROBNP,  in the last 8760 hours CBG:  Recent Labs Lab 05/28/13 1544 05/28/13 2033 05/29/13 0748 05/29/13 1119 05/31/13 1632  GLUCAP 115* 101* 95 120* 98     Signed:  Dory Verdun, MD, FACP, FHM. [facility] Pager [phone]  If 7PM-7AM, please contact night-coverage [email or web address] Password [facility] 06/01/2013, 3:43 PM

## 2013-06-05 ENCOUNTER — Other Ambulatory Visit: Payer: Self-pay | Admitting: Internal Medicine

## 2013-06-07 ENCOUNTER — Encounter (HOSPITAL_COMMUNITY): Payer: Self-pay | Admitting: Emergency Medicine

## 2013-06-07 ENCOUNTER — Ambulatory Visit: Payer: Medicare Other | Admitting: Physical Therapy

## 2013-06-07 ENCOUNTER — Emergency Department (HOSPITAL_COMMUNITY): Payer: Medicare Other

## 2013-06-07 ENCOUNTER — Inpatient Hospital Stay (HOSPITAL_COMMUNITY): Payer: Medicare Other

## 2013-06-07 ENCOUNTER — Ambulatory Visit: Payer: Medicare Other

## 2013-06-07 ENCOUNTER — Observation Stay (HOSPITAL_COMMUNITY)
Admission: EM | Admit: 2013-06-07 | Discharge: 2013-06-08 | Disposition: A | Discharge status: Home / Self Care | Payer: Medicare Other | Attending: Internal Medicine | Admitting: Internal Medicine

## 2013-06-07 DIAGNOSIS — F411 Generalized anxiety disorder: Secondary | ICD-10-CM

## 2013-06-07 DIAGNOSIS — Z79899 Other long term (current) drug therapy: Secondary | ICD-10-CM | POA: Insufficient documentation

## 2013-06-07 DIAGNOSIS — R5381 Other malaise: Secondary | ICD-10-CM | POA: Diagnosis not present

## 2013-06-07 DIAGNOSIS — Z8521 Personal history of malignant neoplasm of larynx: Secondary | ICD-10-CM | POA: Diagnosis not present

## 2013-06-07 DIAGNOSIS — C9000 Multiple myeloma not having achieved remission: Secondary | ICD-10-CM | POA: Diagnosis not present

## 2013-06-07 DIAGNOSIS — Z87891 Personal history of nicotine dependence: Secondary | ICD-10-CM | POA: Diagnosis not present

## 2013-06-07 DIAGNOSIS — N289 Disorder of kidney and ureter, unspecified: Secondary | ICD-10-CM | POA: Diagnosis not present

## 2013-06-07 DIAGNOSIS — Z800 Family history of malignant neoplasm of digestive organs: Secondary | ICD-10-CM

## 2013-06-07 DIAGNOSIS — R1310 Dysphagia, unspecified: Secondary | ICD-10-CM | POA: Diagnosis present

## 2013-06-07 DIAGNOSIS — R079 Chest pain, unspecified: Secondary | ICD-10-CM

## 2013-06-07 DIAGNOSIS — Z8572 Personal history of non-Hodgkin lymphomas: Secondary | ICD-10-CM | POA: Diagnosis present

## 2013-06-07 DIAGNOSIS — Z8701 Personal history of pneumonia (recurrent): Secondary | ICD-10-CM | POA: Insufficient documentation

## 2013-06-07 DIAGNOSIS — Z87898 Personal history of other specified conditions: Secondary | ICD-10-CM | POA: Insufficient documentation

## 2013-06-07 DIAGNOSIS — M1710 Unilateral primary osteoarthritis, unspecified knee: Secondary | ICD-10-CM

## 2013-06-07 DIAGNOSIS — K219 Gastro-esophageal reflux disease without esophagitis: Secondary | ICD-10-CM | POA: Diagnosis not present

## 2013-06-07 DIAGNOSIS — G459 Transient cerebral ischemic attack, unspecified: Principal | ICD-10-CM

## 2013-06-07 DIAGNOSIS — R197 Diarrhea, unspecified: Secondary | ICD-10-CM

## 2013-06-07 DIAGNOSIS — M542 Cervicalgia: Secondary | ICD-10-CM

## 2013-06-07 DIAGNOSIS — Z8673 Personal history of transient ischemic attack (TIA), and cerebral infarction without residual deficits: Secondary | ICD-10-CM | POA: Diagnosis present

## 2013-06-07 DIAGNOSIS — I10 Essential (primary) hypertension: Secondary | ICD-10-CM

## 2013-06-07 DIAGNOSIS — R011 Cardiac murmur, unspecified: Secondary | ICD-10-CM | POA: Insufficient documentation

## 2013-06-07 DIAGNOSIS — Z8579 Personal history of other malignant neoplasms of lymphoid, hematopoietic and related tissues: Secondary | ICD-10-CM | POA: Diagnosis present

## 2013-06-07 DIAGNOSIS — M79642 Pain in left hand: Secondary | ICD-10-CM

## 2013-06-07 DIAGNOSIS — R4789 Other speech disturbances: Secondary | ICD-10-CM | POA: Diagnosis not present

## 2013-06-07 DIAGNOSIS — J4489 Other specified chronic obstructive pulmonary disease: Secondary | ICD-10-CM

## 2013-06-07 DIAGNOSIS — K279 Peptic ulcer, site unspecified, unspecified as acute or chronic, without hemorrhage or perforation: Secondary | ICD-10-CM

## 2013-06-07 DIAGNOSIS — Z888 Allergy status to other drugs, medicaments and biological substances status: Secondary | ICD-10-CM | POA: Diagnosis not present

## 2013-06-07 DIAGNOSIS — E785 Hyperlipidemia, unspecified: Secondary | ICD-10-CM | POA: Diagnosis not present

## 2013-06-07 DIAGNOSIS — D649 Anemia, unspecified: Secondary | ICD-10-CM

## 2013-06-07 DIAGNOSIS — I739 Peripheral vascular disease, unspecified: Secondary | ICD-10-CM

## 2013-06-07 DIAGNOSIS — Z8711 Personal history of peptic ulcer disease: Secondary | ICD-10-CM | POA: Diagnosis not present

## 2013-06-07 DIAGNOSIS — E860 Dehydration: Secondary | ICD-10-CM

## 2013-06-07 DIAGNOSIS — I2510 Atherosclerotic heart disease of native coronary artery without angina pectoris: Secondary | ICD-10-CM | POA: Insufficient documentation

## 2013-06-07 DIAGNOSIS — K432 Incisional hernia without obstruction or gangrene: Secondary | ICD-10-CM

## 2013-06-07 DIAGNOSIS — M1990 Unspecified osteoarthritis, unspecified site: Secondary | ICD-10-CM

## 2013-06-07 DIAGNOSIS — Z86718 Personal history of other venous thrombosis and embolism: Secondary | ICD-10-CM | POA: Diagnosis not present

## 2013-06-07 DIAGNOSIS — R1319 Other dysphagia: Secondary | ICD-10-CM

## 2013-06-07 DIAGNOSIS — Z96659 Presence of unspecified artificial knee joint: Secondary | ICD-10-CM

## 2013-06-07 DIAGNOSIS — M79641 Pain in right hand: Secondary | ICD-10-CM

## 2013-06-07 DIAGNOSIS — L659 Nonscarring hair loss, unspecified: Secondary | ICD-10-CM

## 2013-06-07 DIAGNOSIS — M310 Hypersensitivity angiitis: Secondary | ICD-10-CM | POA: Insufficient documentation

## 2013-06-07 DIAGNOSIS — F3289 Other specified depressive episodes: Secondary | ICD-10-CM

## 2013-06-07 DIAGNOSIS — J449 Chronic obstructive pulmonary disease, unspecified: Secondary | ICD-10-CM | POA: Diagnosis present

## 2013-06-07 DIAGNOSIS — F329 Major depressive disorder, single episode, unspecified: Secondary | ICD-10-CM

## 2013-06-07 DIAGNOSIS — K625 Hemorrhage of anus and rectum: Secondary | ICD-10-CM

## 2013-06-07 DIAGNOSIS — R4701 Aphasia: Secondary | ICD-10-CM

## 2013-06-07 DIAGNOSIS — M19049 Primary osteoarthritis, unspecified hand: Secondary | ICD-10-CM

## 2013-06-07 DIAGNOSIS — R0602 Shortness of breath: Secondary | ICD-10-CM

## 2013-06-07 DIAGNOSIS — I4891 Unspecified atrial fibrillation: Secondary | ICD-10-CM

## 2013-06-07 DIAGNOSIS — N179 Acute kidney failure, unspecified: Secondary | ICD-10-CM

## 2013-06-07 DIAGNOSIS — R509 Fever, unspecified: Secondary | ICD-10-CM

## 2013-06-07 DIAGNOSIS — C8510 Unspecified B-cell lymphoma, unspecified site: Secondary | ICD-10-CM

## 2013-06-07 DIAGNOSIS — M129 Arthropathy, unspecified: Secondary | ICD-10-CM | POA: Diagnosis not present

## 2013-06-07 DIAGNOSIS — M7501 Adhesive capsulitis of right shoulder: Secondary | ICD-10-CM

## 2013-06-07 DIAGNOSIS — K632 Fistula of intestine: Secondary | ICD-10-CM

## 2013-06-07 DIAGNOSIS — R609 Edema, unspecified: Secondary | ICD-10-CM

## 2013-06-07 DIAGNOSIS — K5733 Diverticulitis of large intestine without perforation or abscess with bleeding: Secondary | ICD-10-CM

## 2013-06-07 DIAGNOSIS — I480 Paroxysmal atrial fibrillation: Secondary | ICD-10-CM | POA: Diagnosis present

## 2013-06-07 DIAGNOSIS — R4781 Slurred speech: Secondary | ICD-10-CM

## 2013-06-07 LAB — POCT I-STAT, CHEM 8
BUN: 11 mg/dL (ref 6–23)
Creatinine, Ser: 1.4 mg/dL — ABNORMAL HIGH (ref 0.50–1.10)
Glucose, Bld: 135 mg/dL — ABNORMAL HIGH (ref 70–99)
HCT: 44 % (ref 36.0–46.0)
Hemoglobin: 15 g/dL (ref 12.0–15.0)
Potassium: 3.9 mEq/L (ref 3.5–5.1)
TCO2: 25 mmol/L (ref 0–100)

## 2013-06-07 LAB — CBC
Hemoglobin: 13.7 g/dL (ref 12.0–15.0)
MCHC: 33 g/dL (ref 30.0–36.0)
MCV: 90 fL (ref 78.0–100.0)
Platelets: 192 10*3/uL (ref 150–400)
RBC: 4.61 MIL/uL (ref 3.87–5.11)
RDW: 14.2 % (ref 11.5–15.5)
WBC: 8.3 10*3/uL (ref 4.0–10.5)

## 2013-06-07 LAB — URINALYSIS, ROUTINE W REFLEX MICROSCOPIC
Bilirubin Urine: NEGATIVE
Hgb urine dipstick: NEGATIVE
Nitrite: NEGATIVE
Protein, ur: NEGATIVE mg/dL
Specific Gravity, Urine: 1.011 (ref 1.005–1.030)
Urobilinogen, UA: 0.2 mg/dL (ref 0.0–1.0)
pH: 5.5 (ref 5.0–8.0)

## 2013-06-07 LAB — COMPREHENSIVE METABOLIC PANEL
ALT: 24 U/L (ref 0–35)
AST: 28 U/L (ref 0–37)
Albumin: 4.1 g/dL (ref 3.5–5.2)
Alkaline Phosphatase: 73 U/L (ref 39–117)
CO2: 27 mEq/L (ref 19–32)
Calcium: 10.4 mg/dL (ref 8.4–10.5)
Chloride: 100 mEq/L (ref 96–112)
Creatinine, Ser: 1.15 mg/dL — ABNORMAL HIGH (ref 0.50–1.10)
GFR calc Af Amer: 50 mL/min — ABNORMAL LOW (ref >90)
GFR calc non Af Amer: 43 mL/min — ABNORMAL LOW (ref >90)
Glucose, Bld: 133 mg/dL — ABNORMAL HIGH (ref 70–99)
Potassium: 4.1 mEq/L (ref 3.5–5.1)
Sodium: 138 mEq/L (ref 135–145)
Total Bilirubin: 0.8 mg/dL (ref 0.3–1.2)

## 2013-06-07 LAB — DIFFERENTIAL
Basophils Absolute: 0 10*3/uL (ref 0.0–0.1)
Eosinophils Relative: 2 % (ref 0–5)
Lymphocytes Relative: 11 % — ABNORMAL LOW (ref 12–46)
Lymphs Abs: 0.9 10*3/uL (ref 0.7–4.0)
Neutro Abs: 6.5 10*3/uL (ref 1.7–7.7)

## 2013-06-07 LAB — APTT: aPTT: 31 seconds (ref 24–37)

## 2013-06-07 LAB — PROTIME-INR: Prothrombin Time: 14.6 seconds (ref 11.6–15.2)

## 2013-06-07 LAB — URINE MICROSCOPIC-ADD ON

## 2013-06-07 MED ORDER — ALBUTEROL SULFATE (5 MG/ML) 0.5% IN NEBU: 2.5000 mg | INHALATION_SOLUTION | RESPIRATORY_TRACT | Status: DC | PRN

## 2013-06-07 MED ORDER — POTASSIUM CHLORIDE IN NACL 20-0.9 MEQ/L-% IV SOLN
INTRAVENOUS | Status: DC
  Administered 2013-06-07: 20:00:00 via INTRAVENOUS
  Filled 2013-06-07 (×3): qty 1000

## 2013-06-07 MED ORDER — POLYETHYLENE GLYCOL 3350 17 G PO PACK
17.0000 g | PACK | Freq: Every day | ORAL | Status: DC | PRN
  Filled 2013-06-07: qty 1

## 2013-06-07 MED ORDER — SODIUM CHLORIDE 0.9 % IV BOLUS (SEPSIS): 500.0000 mL | Freq: Once | INTRAVENOUS | Status: AC

## 2013-06-07 MED ORDER — BISOPROLOL FUMARATE 5 MG PO TABS
5.0000 mg | ORAL_TABLET | Freq: Every day | ORAL | Status: DC
  Administered 2013-06-08: 5 mg via ORAL
  Filled 2013-06-07: qty 1

## 2013-06-07 MED ORDER — ONDANSETRON HCL 4 MG/2ML IJ SOLN: 4.0000 mg | Freq: Four times a day (QID) | INTRAMUSCULAR | Status: DC | PRN

## 2013-06-07 MED ORDER — VITAMIN D (ERGOCALCIFEROL) 1.25 MG (50000 UNIT) PO CAPS: 50000.0000 [IU] | ORAL_CAPSULE | ORAL | Status: DC

## 2013-06-07 MED ORDER — ALPRAZOLAM 0.5 MG PO TABS: 0.5000 mg | ORAL_TABLET | Freq: Every evening | ORAL | Status: DC | PRN

## 2013-06-07 MED ORDER — LOPERAMIDE HCL 2 MG PO CAPS: 2.0000 mg | ORAL_CAPSULE | Freq: Every day | ORAL | Status: DC | PRN

## 2013-06-07 MED ORDER — SODIUM CHLORIDE 0.9 % IV BOLUS (SEPSIS)
500.0000 mL | Freq: Once | INTRAVENOUS | Status: AC
  Administered 2013-06-07: 500 mL via INTRAVENOUS

## 2013-06-07 MED ORDER — MIDODRINE HCL 5 MG PO TABS
5.0000 mg | ORAL_TABLET | Freq: Three times a day (TID) | ORAL | Status: DC
  Administered 2013-06-08 (×2): 5 mg via ORAL
  Filled 2013-06-07 (×4): qty 1

## 2013-06-07 MED ORDER — ALPRAZOLAM 0.25 MG PO TABS
0.5000 mg | ORAL_TABLET | Freq: Once | ORAL | Status: AC
  Administered 2013-06-07: 0.5 mg via ORAL
  Filled 2013-06-07: qty 2

## 2013-06-07 MED ORDER — GUAIFENESIN-DM 100-10 MG/5ML PO SYRP: 5.0000 mL | ORAL_SOLUTION | ORAL | Status: DC | PRN

## 2013-06-07 MED ORDER — ONDANSETRON HCL 4 MG PO TABS: 4.0000 mg | ORAL_TABLET | Freq: Four times a day (QID) | ORAL | Status: DC | PRN

## 2013-06-07 MED ORDER — HYDROCODONE-ACETAMINOPHEN 5-325 MG PO TABS: 1.0000 | ORAL_TABLET | ORAL | Status: DC | PRN

## 2013-06-07 MED ORDER — ADULT MULTIVITAMIN W/MINERALS CH
1.0000 | ORAL_TABLET | Freq: Every day | ORAL | Status: DC
  Administered 2013-06-07 – 2013-06-08 (×3): 1 via ORAL
  Filled 2013-06-07 (×4): qty 1

## 2013-06-07 MED ORDER — VITAMIN B-12 100 MCG PO TABS
100.0000 ug | ORAL_TABLET | Freq: Every day | ORAL | Status: DC
  Administered 2013-06-07: 100 ug via ORAL
  Filled 2013-06-07 (×2): qty 1

## 2013-06-07 MED ORDER — DICLOFENAC SODIUM 1 % TD GEL
2.0000 g | Freq: Four times a day (QID) | TRANSDERMAL | Status: DC | PRN
  Filled 2013-06-07: qty 100

## 2013-06-07 MED ORDER — APIXABAN 2.5 MG PO TABS
2.5000 mg | ORAL_TABLET | Freq: Two times a day (BID) | ORAL | Status: DC
  Administered 2013-06-07 – 2013-06-08 (×2): 2.5 mg via ORAL
  Filled 2013-06-07 (×3): qty 1

## 2013-06-07 NOTE — ED Notes (Signed)
Spoke with Dr. Elbert Ewings about when he will be into to see the pt, he reports she is the next one on his list. Pt and family informed and told approx 30 mins.

## 2013-06-07 NOTE — ED Notes (Addendum)
Spoke with MD about going to see pt, he sts he will be in there within next 5 mins. Family has been updated.

## 2013-06-07 NOTE — ED Notes (Signed)
Family upset about no physician coming to see the pt. Explained there were multiple critical patients and all the physicians are running behind now, will talk with MD and find out about what time he can come see her.

## 2013-06-07 NOTE — ED Notes (Signed)
Neuro PA at bedside.  

## 2013-06-07 NOTE — ED Notes (Addendum)
Pt has recent hx of stroke and stroke like symptoms. Pt reports she woke up this morning with difficulty speaking. Pt has slight left sided facial droop but husband reports this is normal for her. Nad, skin warm and dry, resp e/u.

## 2013-06-07 NOTE — ED Notes (Signed)
eeg completed. Pt is ready to go to the floor

## 2013-06-07 NOTE — ED Notes (Signed)
Pt returned from MRI °

## 2013-06-07 NOTE — H&P (Signed)
[facility]                                                                                    Patient Demographics  [name], is a 79 y.o. female  MRN: [id number]   DOB - 12-12-1927  Admit Date - 06/07/2013  Outpatient Primary MD for the patient is [name], MD   With History of -  Past Medical History  Diagnosis Date  . GERD (gastroesophageal reflux disease)   . History of TIAs   . Diverticulosis   . PUD (peptic ulcer disease)   . PVD (peripheral vascular disease)   . Hyperlipidemia   . Anxiety   . Depression   . Blood transfusion     "w/1st miscarriage" (05/26/2013)  . COPD (chronic obstructive pulmonary disease)   . Hypertension   . PONV (postoperative nausea and vomiting)   . Family history of anesthesia complication     "sisters also get sick as a dog" (05/26/2013)  . Coronary artery disease   . Heart murmur     "when I was a child" (05/26/2013)  . DVT (deep venous thrombosis) 1954    "after I had my first child" (05/26/2013)  . Pneumonia 2013    "once" (05/26/2013)  . Exertional shortness of breath   . H/O hiatal hernia   . Stroke 10/2002    no residual  . Stroke 05/26/2013    "very mild; affected my speech for a while" (05/26/2013)  . Arthritis     "lots; hands" (05/26/2013)  . 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 Lymphona, mantle cell, inguinal region/lower limb     "large c-cell" (05/26/2013)      Past Surgical History  Procedure Laterality Date  . 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 Tubal ligation  1972  . Radical neck dissection  1986    "larynx cancer" (05/26/2013)  . Total knee arthroplasty Bilateral     2002 left 2008 right  . Ileostomy closure  01/2010  . Cataract extraction  2005    bilateral  . Carotid endarterectomy Bilateral 2004    2004 a month apart right and left  . Tonsillectomy and adenoidectomy  1936     "tonsils grew back; no 2nd OR" (05/26/2013)  . Hernia repair  05/2010    ventral hernia repair  . Carpal tunnel release Left ?1960's  . Abdominal exploration surgery  12/01/2012  . Appendectomy  09/13/2008  . Colon surgery  09/13/2008    Laparoscopic assisted converted to open sigmoid colectomy.Hattie Perch 09/13/2008 (05/26/2013)  . Cataract extraction w/ intraocular lens  implant, bilateral Bilateral ~ 2003  . Dilation and curettage of uterus  1950's    "had 2 for miscarriages" (05/26/2013)    in for   Chief Complaint  Patient presents with  . Dysphagia  . Nausea     HPI  [name]  is a 79 y.o. female, with recent 2 admissions suspicious for CVA and then TIA, switched from Pradaxa to Eliquis 2 admissions ago recently, severe orthostatic hypotension, peptic ulcer disease, PAD, depression, COPD, hypertension,  CAD, DVT in the past, comes in for a another episode of forgetfulness, disoriented behavior and slurred speech which started sometime last night, patient does not recall going to the bed, she did not wear her usual clothes last night and was wearing only a T-shirt when she went to bed which is very unusual for her, husband saw her being extremely anxious the whole night trying to get out of the bed, this morning she was noted wearing some strange clothes and standing in the closet looking lost, she was also found to have slurred speech, and then brought to the ER where she was seen by neurologist, an MRI MRA of the brain was done which showed no new changes, stroke was ruled out and hospitalist service was requested to admit the patient for further workup including workup for seizures.  He should currently is close to her baseline and symptom free, she was not a TPA candidate, she does report of some loose stools over the last few days, other than that currently negative review of systems. Noticeably no headache, no chest pain or palpitations, no abdominal pain, no focal weakness. No  problems following food or liquids or speech problems at this time.    Review of Systems currently negative    In addition to the HPI above,  No Fever-chills, No Headache, No changes with Vision or hearing, No problems swallowing food or Liquids, No Chest pain, Cough or Shortness of Breath, No Abdominal pain, No Nausea or Vommitting, Bowel movements are regular, No Blood in stool or Urine, No dysuria, No new skin rashes or bruises, No new joints pains-aches,  No new weakness, tingling, numbness in any extremity, No recent weight gain or loss, No polyuria, polydypsia or polyphagia, No significant Mental Stressors.  A full 10 point Review of Systems was done, except as stated above, all other Review of Systems were negative.   Social History History  Substance Use Topics  . Smoking status: Former Smoker -- 2.50 packs/day for 37 years    Types: Cigarettes    Quit date: 09/05/1984  . Smokeless tobacco: Never Used  . Alcohol Use: No      Family History Family History  Problem Relation Age of Onset  . Colon cancer Sister     colon  . Hyperlipidemia Mother   . Stroke Mother   . Cancer Father     mesothelioma      Prior to Admission medications   Medication Sig Start Date End Date Taking? Authorizing Provider  ALPRAZolam Prudy Feeler) 0.5 MG tablet Take 1 tablet (0.5 mg total) by mouth at bedtime as needed for sleep. 05/15/13  Yes Stacie Glaze, MD  apixaban (ELIQUIS) 2.5 MG TABS tablet Take 1 tablet (2.5 mg total) by mouth 2 (two) times daily. 05/29/13  Yes Leroy Sea, MD  bisoprolol (ZEBETA) 5 MG tablet Take 5 mg by mouth daily at 12 noon.    Yes Historical Provider, MD  Cyanocobalamin (VITAMIN B12 PO) Take 5,000 mcg by mouth daily.   Yes Historical Provider, MD  diclofenac sodium (VOLTAREN) 1 % GEL Apply 2 g topically 4 (four) times daily as needed (pain; apply to hand).    Yes Historical Provider, MD  loperamide (IMODIUM) 2 MG capsule Take 2 mg by mouth daily as  needed for diarrhea or loose stools.    Yes Historical Provider, MD  midodrine (PROAMATINE) 5 MG tablet Take 1 tablet (5 mg total) by mouth 3 (three) times daily with meals. 05/29/13  Yes  Leroy Sea, MD  Multiple Vitamin (MULTIVITAMIN WITH MINERALS) TABS tablet Take 1 tablet by mouth daily.   Yes Historical Provider, MD  Vitamin D, Ergocalciferol, (DRISDOL) 50000 UNITS CAPS Take 50,000 Units by mouth every Sunday.   Yes Historical Provider, MD    Allergies  Allergen Reactions  . Propoxyphene Hcl     Rxn: unknown    Physical Exam  Vitals  Blood pressure 117/53, pulse 77, temperature 98.5 F (36.9 C), temperature source Oral, resp. rate 19, SpO2 91.00%.   1. General elderly white female lying in bed in NAD,     2. Normal affect and insight, Not Suicidal or Homicidal, Awake Alert, Oriented X 3.  3. No F.N deficits, ALL C.Nerves Intact, Strength 5/5 all 4 extremities, Sensation intact all 4 extremities, Plantars down going.  4. Ears and Eyes appear Normal, Conjunctivae clear, PERRLA. Moist Oral Mucosa.  5. Supple Neck, No JVD, No cervical lymphadenopathy appriciated, No Carotid Bruits.  6. Symmetrical Chest wall movement, Good air movement bilaterally, CTAB.  7. RRR, No Gallops, Rubs or Murmurs, No Parasternal Heave.  8. Positive Bowel Sounds, Abdomen Soft, Non tender, No organomegaly appriciated,No rebound -guarding or rigidity.  9.  No Cyanosis, Normal Skin Turgor, No Skin Rash or Bruise.  10. Good muscle tone,  joints appear normal , no effusions, Normal ROM.  11. No Palpable Lymph Nodes in Neck or Axillae     Data Review  CBC  Recent Labs Lab 05/31/13 1647 05/31/13 2243 06/07/13 0938 06/07/13 0956  WBC  --  6.3 8.3  --   HGB 14.6 12.9 13.7 15.0  HCT 43.0 39.6 41.5 44.0  PLT  --  181 192  --   MCV  --  89.4 90.0  --   MCH  --  29.1 29.7  --   MCHC  --  32.6 33.0  --   RDW  --  14.0 14.2  --   LYMPHSABS  --   --  0.9  --   MONOABS  --   --  0.6  --    EOSABS  --   --  0.1  --   BASOSABS  --   --  0.0  --    ------------------------------------------------------------------------------------------------------------------  Chemistries   Recent Labs Lab 05/31/13 1647 05/31/13 2243 06/01/13 0545 06/07/13 0938 06/07/13 0956  NA 139  --  143 138 140  K 3.7  --  5.1 4.1 3.9  CL 106  --  108 100 101  CO2  --   --  23 27  --   GLUCOSE 107*  --  100* 133* 135*  BUN 17  --  16 12 11   CREATININE 1.30* 1.13* 1.06 1.15* 1.40*  CALCIUM  --   --  9.8 10.4  --   AST  --   --   --  28  --   ALT  --   --   --  24  --   ALKPHOS  --   --   --  73  --   BILITOT  --   --   --  0.8  --    ------------------------------------------------------------------------------------------------------------------ CrCl is unknown because both a height and weight (above a minimum accepted value) are required for this calculation. ------------------------------------------------------------------------------------------------------------------ No results found for this basename: TSH, T4TOTAL, FREET3, T3FREE, THYROIDAB,  in the last 72 hours   Coagulation profile  Recent Labs Lab 06/07/13 0938  INR 1.16   ------------------------------------------------------------------------------------------------------------------- No results found for this  basename: DDIMER,  in the last 72 hours -------------------------------------------------------------------------------------------------------------------  Cardiac Enzymes  Recent Labs Lab 06/07/13 0938  TROPONINI <0.30   ------------------------------------------------------------------------------------------------------------------ No components found with this basename: POCBNP,    ---------------------------------------------------------------------------------------------------------------  Urinalysis    Component Value Date/Time   COLORURINE YELLOW 06/07/2013 1415   APPEARANCEUR CLEAR 06/07/2013  1415   LABSPEC 1.011 06/07/2013 1415   PHURINE 5.5 06/07/2013 1415   GLUCOSEU NEGATIVE 06/07/2013 1415   HGBUR NEGATIVE 06/07/2013 1415   HGBUR trace-lysed 02/13/2010 0830   BILIRUBINUR NEGATIVE 06/07/2013 1415   BILIRUBINUR neg 05/19/2013 1716   KETONESUR NEGATIVE 06/07/2013 1415   PROTEINUR NEGATIVE 06/07/2013 1415   UROBILINOGEN 0.2 06/07/2013 1415   UROBILINOGEN 1.0 05/19/2013 1716   NITRITE NEGATIVE 06/07/2013 1415   NITRITE neg 05/19/2013 1716   LEUKOCYTESUR TRACE* 06/07/2013 1415    ----------------------------------------------------------------------------------------------------------------  Lab Results  Component Value Date   HGBA1C 5.9* 05/27/2013    Lab Results  Component Value Date   CHOL 163 05/27/2013   HDL 31* 05/27/2013   LDLCALC 97 05/27/2013   LDLDIRECT 100.9 02/13/2010   TRIG 176* 05/27/2013   CHOLHDL 5.3 05/27/2013    Imaging results:    [name] Head Wo Contrast  06/07/2013   CLINICAL DATA:  Recurrent aphasia. Recent infarct. Patient is anticoagulated.  EXAM: MRI HEAD WITHOUT CONTRAST  MRA HEAD WITHOUT CONTRAST  TECHNIQUE: Multiplanar, multiecho pulse sequences of the brain and surrounding structures were obtained without intravenous contrast. Angiographic images of the head were obtained using MRA technique without contrast.  COMPARISON:  MR brain 05/31/2013. MR brain 05/27/2013. CT head earlier today.  FINDINGS: MRI HEAD FINDINGS  Pseudonormalization on DWI series of the more acute punctate nonhemorrhagic infarct (demonstrated on MR 05/31/2013) involving the posterior left coronal radiata. The older subacute infarct of the left lentiform nucleus and anterior coronal radiata shows typical evolutionary change with "normalized" peripheral hyperintensity on DWI sequence but some central restricted diffusion. No intervening hemorrhage within the infarct.  There is a subcentimeter focus of T1 shortening in the posterior putamen on the left. This could be located at the very  lower most extent of the infarct based on prior images showing the area of acute restricted diffusion at its largest. I cannot exclude a tiny focus of subacute microhemorrhage in the left putamen as this area has evolved compared with 10/25 and 10/29. This area is not visible on today's CT scan.  No new infarcts. Continued atrophy with chronic microvascular ischemic change.  MRA HEAD FINDINGS  Widely patent internal carotid arteries. Basilar artery widely patent with left vertebral dominant. Right vertebral maintains a hypoplastic connection to the basilar and appears to primarily supply the PICA. There is no proximal intracranial stenosis or aneurysm.  IMPRESSION: No new infarction is evident. Typical evolutionary change of the previously identified left deep white matter and basal ganglia infarcts.  There is a subcentimeter focus of T1 shortening, possible subacute hemorrhage, in the left posterior putamen marginally adjacent to the area of previous acute infarction displayed on 05/27/2013. Significance uncertain.  Unremarkable MRA intracranial circulation.   Electronically Signed   By: Davonna Belling M.D.   On: 06/07/2013 15:44      2 D echo: 12.25.14   - Left ventricle: Wall thickness was increased in a pattern of moderate LVH. Systolic function was normal. The estimated ejection fraction was in the range of 50% to 55%. Features are consistent with a pseudonormal left ventricular filling pattern, with concomitant abnormal relaxation and increased filling pressure (grade 2  diastolic dysfunction). Doppler parameters are consistent with high ventricular filling pressure.    Carotid Dopplers completed. 10.25.14  Preliminary report: There is 1-39% ICA stenosis. Vertebral artery flow is antegrade     My personal review of EKG: Rhythm NSR, Rate  62 /min,   no Acute ST changes    Assessment & Plan    1. Disoriented behavior with questionable slurred speech this morning. Recent multiple admissions  for stroke and TIA, currently on Eliquis - note patient has had extensive CVA workup including echo gram, carotid duplex, MRI MRA this admission, A1c and lipid panel and she is appropriately been placed on Eliquis for secondary prevention in the light of her underlying atrial fibrillation and confirms stroke in the past. Current scan of the brain does not show any acute change explaining her symptoms. She has been seen by neurology who requested continuation of Eliquis and doing EEG. She will be admitted to the telemetry bed, frequent neuro checks per CVA protocol, EEG, continue Eliquis. Be seen by PT, OT and speech. Recent A1c, lipid panel, echo and carotid duplex as above. Neuro we'll continue to follow.    2. Severe orthostatic hypotension. I question if this is playing a role in her symptoms, she recently also had diarrhea, won't be surprised if her orthostatics are worse this admission, we will check orthostatics frequently, continue her midodrine, gentle IV fluids, TED stockings, check stool to rule out C. Difficile.    3. Atrial fibrillation. Goal will be rate controlled, continue her home dose beta blocker and Eliquis. Telemetry monitoring.    4. History of COPD. No acute issues at baseline. As needed nebulizer and oxygen treatments.    5. History of B12 deficiency. Continue oral supplementation of B12, check baseline B12 levels.     6. History of PAD and DVT in the past. Continue Eliquis for secondary prevention.      DVT Prophylaxis Eliquis  AM Labs Ordered, also please review Full Orders  Family Communication: Admission, patients condition and plan of care including tests being ordered have been discussed with the patient and Full who indicate understanding and agree with the plan and Code Status.  Code Status Full  Likely DC to  Home  Condition GUARDED   Time spent in minutes : 35    SINGH,PRASHANT K M.D on 06/07/2013 at 4:12 PM  Between 7am to 7pm - Pager -  [phone]  After 7pm go to [email or web address] - password [email or web address]  And look for the night coverage person covering me after hours  [facility] Office  [phone]

## 2013-06-07 NOTE — ED Notes (Signed)
EEG AT BEDSIDE 

## 2013-06-07 NOTE — Consult Note (Signed)
Referring Physician: Deretha Emory    Chief Complaint:  stroke  HPI:                                                                                                                                         [name] is an 79 y.o. female female with a recent infarct who presents with recurrent aphasia 05/31/2013. At that time imaging confirmed a new punctate left corona radiata infarct. Infarct felt to be embolic secondary to afib. Patient is and was on Eliquis for stroke prevention. Per family patient was not sleeping well last night. At one point her husband found her standing in her closet naked and then she put on a large "summer dress with a sweater hanging out of the arm".  Patient does not recall this.  Upon waking at 6:30 this am she was noted to have expressive difficulties, gait was unbalanced and she was nauseated. Symptoms have fully resolved at this time.  Daughter understands she is on Eliquis and this was just initiated.   Date last known well: Date: 06/06/2013 Time last known well: Unable to determine tPA Given: No: symptoms resolved  Past Medical History  Diagnosis Date  . GERD (gastroesophageal reflux disease)   . History of TIAs   . Diverticulosis   . PUD (peptic ulcer disease)   . PVD (peripheral vascular disease)   . Hyperlipidemia   . Anxiety   . Depression   . Blood transfusion     "w/1st miscarriage" (05/26/2013)  . COPD (chronic obstructive pulmonary disease)   . Hypertension   . PONV (postoperative nausea and vomiting)   . Family history of anesthesia complication     "sisters also get sick as a dog" (05/26/2013)  . Coronary artery disease   . Heart murmur     "when I was a child" (05/26/2013)  . DVT (deep venous thrombosis) 1954    "after I had my first child" (05/26/2013)  . Pneumonia 2013    "once" (05/26/2013)  . Exertional shortness of breath   . H/O hiatal hernia   . Stroke 10/2002    no residual  . Stroke 05/26/2013    "very mild; affected  my speech for a while" (05/26/2013)  . Arthritis     "lots; hands" (05/26/2013)  . 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 Lymphona, mantle cell, inguinal region/lower limb     "large c-cell" (05/26/2013)    Past Surgical History  Procedure Laterality Date  . 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 Tubal ligation  1972  . Radical neck dissection  1986    "larynx cancer" (05/26/2013)  . Total knee arthroplasty Bilateral     2002 left 2008 right  . Ileostomy closure  01/2010  . Cataract extraction  2005    bilateral  .  Carotid endarterectomy Bilateral 2004    2004 a month apart right and left  . Tonsillectomy and adenoidectomy  1936    "tonsils grew back; no 2nd OR" (05/26/2013)  . Hernia repair  05/2010    ventral hernia repair  . Carpal tunnel release Left ?1960's  . Abdominal exploration surgery  12/01/2012  . Appendectomy  09/13/2008  . Colon surgery  09/13/2008    Laparoscopic assisted converted to open sigmoid colectomy.Hattie Perch 09/13/2008 (05/26/2013)  . Cataract extraction w/ intraocular lens  implant, bilateral Bilateral ~ 2003  . Dilation and curettage of uterus  1950's    "had 2 for miscarriages" (05/26/2013)    Family History  Problem Relation Age of Onset  . Colon cancer Sister     colon  . Hyperlipidemia Mother   . Stroke Mother   . Cancer Father     mesothelioma   Social History:  reports that she quit smoking about 28 years ago. Her smoking use included Cigarettes. She has a 92.5 pack-year smoking history. She has never used smokeless tobacco. She reports that she does not drink alcohol or use illicit drugs.  Allergies:  Allergies  Allergen Reactions  . Propoxyphene Hcl     Rxn: unknown    Medications:                                                                                                                           No current facility-administered  medications for this encounter.   Current Outpatient Prescriptions  Medication Sig Dispense Refill  . ALPRAZolam (XANAX) 0.5 MG tablet Take 1 tablet (0.5 mg total) by mouth at bedtime as needed for sleep.  30 tablet  3  . apixaban (ELIQUIS) 2.5 MG TABS tablet Take 1 tablet (2.5 mg total) by mouth 2 (two) times daily.  60 tablet  0  . bisoprolol (ZEBETA) 5 MG tablet Take 5 mg by mouth daily at 12 noon.       . Cyanocobalamin (VITAMIN B12 PO) Take 5,000 mcg by mouth daily.      . diclofenac sodium (VOLTAREN) 1 % GEL Apply 2 g topically 4 (four) times daily as needed (pain; apply to hand).       . loperamide (IMODIUM) 2 MG capsule Take 2 mg by mouth daily as needed for diarrhea or loose stools.       . midodrine (PROAMATINE) 5 MG tablet Take 1 tablet (5 mg total) by mouth 3 (three) times daily with meals.  90 tablet  0  . Multiple Vitamin (MULTIVITAMIN WITH MINERALS) TABS tablet Take 1 tablet by mouth daily.      . Vitamin D, Ergocalciferol, (DRISDOL) 50000 UNITS CAPS Take 50,000 Units by mouth every Sunday.        ROS:  History obtained from the patient  General ROS: negative for - chills, fatigue, fever, night sweats, weight gain or weight loss Psychological ROS: negative for - behavioral disorder, hallucinations, memory difficulties, mood swings or suicidal ideation Ophthalmic ROS: negative for - blurry vision, double vision, eye pain or loss of vision ENT ROS: negative for - epistaxis, nasal discharge, oral lesions, sore throat, tinnitus or vertigo Allergy and Immunology ROS: negative for - hives or itchy/watery eyes Hematological and Lymphatic ROS: negative for - bleeding problems, bruising or swollen lymph nodes Endocrine ROS: negative for - galactorrhea, hair pattern changes, polydipsia/polyuria or temperature intolerance Respiratory ROS: negative for -  cough, hemoptysis, shortness of breath or wheezing Cardiovascular ROS: negative for - chest pain, dyspnea on exertion, edema or irregular heartbeat Gastrointestinal ROS: negative for - abdominal pain, diarrhea, hematemesis, nausea/vomiting or stool incontinence Genito-Urinary ROS: negative for - dysuria, hematuria, incontinence or urinary frequency/urgency Musculoskeletal ROS: negative for - joint swelling or muscular weakness Neurological ROS: as noted in HPI Dermatological ROS: negative for rash and skin lesion changes  Neurologic Examination:                                                                                                      Blood pressure 153/71, pulse 77, temperature 97.8 F (36.6 C), temperature source Oral, resp. rate 17, SpO2 91.00%.  Mental Status: Alert, oriented, thought content appropriate.  Speech fluent without evidence of aphasia.  Able to follow 3 step commands without difficulty. Cranial Nerves: II: Discs flat bilaterally; Visual fields grossly normal, pupils equal, round, reactive to light and accommodation III,IV, VI: ptosis not present, extra-ocular motions intact bilaterally V,VII: smile symmetric, facial light touch sensation normal bilaterally VIII: hearing normal bilaterally IX,X: gag reflex present XI: bilateral shoulder shrug XII: midline tongue extension without atrophy or fasciculations  Motor: Right : Upper extremity   5/5    Left:     Upper extremity   5/5  Lower extremity   5/5     Lower extremity   5/5 Tone and bulk:normal tone throughout; no atrophy noted Sensory: Pinprick and light touch intact throughout, bilaterally Deep Tendon Reflexes:  1+ throughout UE and no KJ or AJ Plantars: Right: downgoing   Left: downgoing Cerebellar: normal finger-to-nose,  normal heel-to-shin test Gait: not assessed CV: pulses palpable throughout    Results for orders placed during the hospital encounter of 06/07/13 (from the past 48 hour(s))   PROTIME-INR     Status: None   Collection Time    06/07/13  9:38 AM      Result Value Range   Prothrombin Time 14.6  11.6 - 15.2 seconds   INR 1.16  0.00 - 1.49  APTT     Status: None   Collection Time    06/07/13  9:38 AM      Result Value Range   aPTT 31  24 - 37 seconds  CBC     Status: None   Collection Time    06/07/13  9:38 AM      Result Value Range   WBC 8.3  4.0 - 10.5 K/uL   RBC 4.61  3.87 - 5.11 MIL/uL   Hemoglobin 13.7  12.0 - 15.0 g/dL   HCT 16.1  09.6 - 04.5 %   MCV 90.0  78.0 - 100.0 fL   MCH 29.7  26.0 - 34.0 pg   MCHC 33.0  30.0 - 36.0 g/dL   RDW 40.9  81.1 - 91.4 %   Platelets 192  150 - 400 K/uL  DIFFERENTIAL     Status: Abnormal   Collection Time    06/07/13  9:38 AM      Result Value Range   Neutrophils Relative % 79 (*) 43 - 77 %   Neutro Abs 6.5  1.7 - 7.7 K/uL   Lymphocytes Relative 11 (*) 12 - 46 %   Lymphs Abs 0.9  0.7 - 4.0 K/uL   Monocytes Relative 8  3 - 12 %   Monocytes Absolute 0.6  0.1 - 1.0 K/uL   Eosinophils Relative 2  0 - 5 %   Eosinophils Absolute 0.1  0.0 - 0.7 K/uL   Basophils Relative 0  0 - 1 %   Basophils Absolute 0.0  0.0 - 0.1 K/uL  COMPREHENSIVE METABOLIC PANEL     Status: Abnormal   Collection Time    06/07/13  9:38 AM      Result Value Range   Sodium 138  135 - 145 mEq/L   Potassium 4.1  3.5 - 5.1 mEq/L   Chloride 100  96 - 112 mEq/L   CO2 27  19 - 32 mEq/L   Glucose, Bld 133 (*) 70 - 99 mg/dL   BUN 12  6 - 23 mg/dL   Creatinine, Ser 7.82 (*) 0.50 - 1.10 mg/dL   Calcium 95.6  8.4 - 21.3 mg/dL   Total Protein 7.5  6.0 - 8.3 g/dL   Albumin 4.1  3.5 - 5.2 g/dL   AST 28  0 - 37 U/L   ALT 24  0 - 35 U/L   Alkaline Phosphatase 73  39 - 117 U/L   Total Bilirubin 0.8  0.3 - 1.2 mg/dL   GFR calc non Af Amer 43 (*) >90 mL/min   GFR calc Af Amer 50 (*) >90 mL/min   Comment: (NOTE)     The eGFR has been calculated using the CKD EPI equation.     This calculation has not been validated in all clinical situations.      eGFR's persistently <90 mL/min signify possible Chronic Kidney     Disease.  TROPONIN I     Status: None   Collection Time    06/07/13  9:38 AM      Result Value Range   Troponin I <0.30  <0.30 ng/mL   Comment:            Due to the release kinetics of cTnI,     a negative result within the first hours     of the onset of symptoms does not rule out     myocardial infarction with certainty.     If myocardial infarction is still suspected,     repeat the test at appropriate intervals.  POCT I-STAT TROPONIN I     Status: None   Collection Time    06/07/13  9:54 AM      Result Value Range   Troponin i, poc 0.01  0.00 - 0.08 ng/mL   Comment 3  Comment: Due to the release kinetics of cTnI,     a negative result within the first hours     of the onset of symptoms does not rule out     myocardial infarction with certainty.     If myocardial infarction is still suspected,     repeat the test at appropriate intervals.  POCT I-STAT, CHEM 8     Status: Abnormal   Collection Time    06/07/13  9:56 AM      Result Value Range   Sodium 140  135 - 145 mEq/L   Potassium 3.9  3.5 - 5.1 mEq/L   Chloride 101  96 - 112 mEq/L   BUN 11  6 - 23 mg/dL   Creatinine, Ser 0.98 (*) 0.50 - 1.10 mg/dL   Glucose, Bld 119 (*) 70 - 99 mg/dL   Calcium, Ion 1.47  8.29 - 1.30 mmol/L   TCO2 25  0 - 100 mmol/L   Hemoglobin 15.0  12.0 - 15.0 g/dL   HCT 56.2  13.0 - 86.5 %   Ct Head (brain) Wo Contrast  06/07/2013   CLINICAL DATA:  Dysphasia. Nausea  EXAM: CT HEAD WITHOUT CONTRAST  TECHNIQUE: Contiguous axial images were obtained from the base of the skull through the vertex without intravenous contrast.  COMPARISON:  CT 05/31/2013  FINDINGS: Generalized atrophy. Moderate chronic microvascular ischemic change in the white matter. Negative for acute infarct. Negative for hemorrhage or mass lesion. Ventricle size is normal. No shift of the midline structures. Calvarium is intact.  IMPRESSION: Chronic  microvascular ischemic change. No acute abnormality.   Electronically Signed   By: Marlan Palau M.D.   On: 06/07/2013 10:17   HgbA1C  Lab Results   Component  Value  Date    HGBA1C  5.9*  05/27/2013    Lipid Panel    Component  Value  Date/Time    CHOL  163  05/27/2013 0425    TRIG  176*  05/27/2013 0425    HDL  31*  05/27/2013 0425    CHOLHDL  5.3  05/27/2013 0425    VLDL  35  05/27/2013 0425    LDLCALC  97  05/27/2013 0425    MRI of the brain 05/31/2013 1. New punctate nonhemorrhagic infarct involving the posterior left coronal radiata. 2. The acute infarct of the left lentiform nucleus and coronal radiata is otherwise stable. 3. Atrophy and diffuse white matter disease is also stable. 4. Moderate cervical spondylosis.   CT angio head and neck on 05-26-13 IMPRESSION:  CTA HEAD IMPRESSION  1. Atrophy and diffuse white matter disease without evidence for an  acute infarct.  2. Atherosclerotic changes of the cavernous carotid arteries without  significant stenoses.  CTA NECK IMPRESSION  1. Atherosclerotic irregularity within the distal common carotid  arteries bilaterally with medial deviation.  2. Focal 50% stenosis of the mid right cervical ICA without other  significant stenoses.  3. Mild ectasia of the dominant left vertebral artery.   2 D echo: 12.25.14 Study Conclusions  - Left ventricle: Wall thickness was increased in a pattern of moderate LVH. Systolic function was normal. The estimated ejection fraction was in the range of 50% to 55%. Features are consistent with a pseudonormal left ventricular filling pattern, with concomitant abnormal relaxation and increased filling pressure (grade 2 diastolic dysfunction). Doppler parameters are consistent with high ventricular filling pressure.   Carotid Dopplers completed. 10.25.14 Preliminary report: There is 1-39% ICA stenosis. Vertebral artery flow is  antegrade   Assessment and plan discussed with with attending  physician and they are in agreement.    Felicie Morn PA-C [facility] [phone]  06/07/2013, 1:04 PM   Assessment: 79 y.o. female with know risk factors for stroke and previous CVA. Patient was recently seen in hospital by stroke team on 05-26-13 and stroke workup was negative with exception of Afib.  Patient was placed on Eliquis.  Currently she is in NSR.  Recurent stroke cannot be ruled out in addition to possible seizure activity.   Stroke Risk Factors - hyperlipidemia, hypertension and CAD and CVA  Recommend: 1) MRI brain  2) EEG 3) Continue Eliquis at current dose.     Patient seen and examined together with physician assistant and I concur with the assessment and plan.  Wyatt Portela, MD

## 2013-06-07 NOTE — ED Provider Notes (Addendum)
CSN: [id number]     Arrival date & time 06/07/13  0904 History   First MD Initiated Contact with Patient 06/07/13 [phone]     Chief Complaint  Patient presents with  . Dysphagia  . Nausea   (Consider location/radiation/quality/duration/timing/severity/associated sxs/prior Treatment) The history is provided by the patient, the spouse, a relative and the EMS personnel.   79 year old female history of TIAs and strokes. Patient was admitted October 29 October 24 of the same. Patient is a morning at 745 all of a sudden had significant speech problems but able to talk at all last for about the 45 minutes before resolved. Patient's recent MRI did show abnormalities most likely in the basal ganglion as per family. Patient primary care doctors Dr. Lovell Sheehan. Has also been evaluated by neurology during the last 2 admissions. All symptoms now are resolved other than her baseline mild dysphasia which has been present for a while. Patient denies any pain or headache. Patient also was having some trouble with walking when the event occurred she has not been up on her feet since since and unable to assess.  Past Medical History  Diagnosis Date  . GERD (gastroesophageal reflux disease)   . History of TIAs   . Diverticulosis   . PUD (peptic ulcer disease)   . PVD (peripheral vascular disease)   . Hyperlipidemia   . Anxiety   . Depression   . Blood transfusion     "w/1st miscarriage" (05/26/2013)  . COPD (chronic obstructive pulmonary disease)   . Hypertension   . PONV (postoperative nausea and vomiting)   . Family history of anesthesia complication     "sisters also get sick as a dog" (05/26/2013)  . Coronary artery disease   . Heart murmur     "when I was a child" (05/26/2013)  . DVT (deep venous thrombosis) 1954    "after I had my first child" (05/26/2013)  . Pneumonia 2013    "once" (05/26/2013)  . Exertional shortness of breath   . H/O hiatal hernia   . Stroke 10/2002    no residual  . Stroke  05/26/2013    "very mild; affected my speech for a while" (05/26/2013)  . Arthritis     "lots; hands" (05/26/2013)  . 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 Lymphona, mantle cell, inguinal region/lower limb     "large c-cell" (05/26/2013)   Past Surgical History  Procedure Laterality Date  . 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 Tubal ligation  1972  . Radical neck dissection  1986    "larynx cancer" (05/26/2013)  . Total knee arthroplasty Bilateral     2002 left 2008 right  . Ileostomy closure  01/2010  . Cataract extraction  2005    bilateral  . Carotid endarterectomy Bilateral 2004    2004 a month apart right and left  . Tonsillectomy and adenoidectomy  1936    "tonsils grew back; no 2nd OR" (05/26/2013)  . Hernia repair  05/2010    ventral hernia repair  . Carpal tunnel release Left ?1960's  . Abdominal exploration surgery  12/01/2012  . Appendectomy  09/13/2008  . Colon surgery  09/13/2008    Laparoscopic assisted converted to open sigmoid colectomy.Hattie Perch 09/13/2008 (05/26/2013)  . Cataract extraction w/ intraocular lens  implant, bilateral Bilateral ~ 2003  . Dilation and curettage of uterus  1950's    "had 2 for miscarriages" (05/26/2013)  Family History  Problem Relation Age of Onset  . Colon cancer Sister     colon  . Hyperlipidemia Mother   . Stroke Mother   . Cancer Father     mesothelioma   History  Substance Use Topics  . Smoking status: Former Smoker -- 2.50 packs/day for 37 years    Types: Cigarettes    Quit date: 09/05/1984  . Smokeless tobacco: Never Used  . Alcohol Use: No   OB History   Grav Para Term Preterm Abortions TAB SAB Ect Mult Living                 Review of Systems  Constitutional: Negative for fever.  HENT: Negative for congestion.   Eyes: Negative for redness.  Respiratory: Negative for shortness of breath.    Gastrointestinal: Negative for nausea, vomiting and abdominal pain.  Genitourinary: Negative for dysuria.  Musculoskeletal: Negative for back pain.  Neurological: Positive for speech difficulty and weakness. Negative for headaches.  Hematological: Does not bruise/bleed easily.  Psychiatric/Behavioral: Negative for confusion.    Allergies  Propoxyphene hcl  Home Medications   Current Outpatient Rx  Name  Route  Sig  Dispense  Refill  . ALPRAZolam (XANAX) 0.5 MG tablet   Oral   Take 1 tablet (0.5 mg total) by mouth at bedtime as needed for sleep.   30 tablet   3   . apixaban (ELIQUIS) 2.5 MG TABS tablet   Oral   Take 1 tablet (2.5 mg total) by mouth 2 (two) times daily.   60 tablet   0   . bisoprolol (ZEBETA) 5 MG tablet   Oral   Take 5 mg by mouth daily at 12 noon.          . Cyanocobalamin (VITAMIN B12 PO)   Oral   Take 5,000 mcg by mouth daily.         . diclofenac sodium (VOLTAREN) 1 % GEL   Topical   Apply 2 g topically 4 (four) times daily as needed (pain; apply to hand).          . loperamide (IMODIUM) 2 MG capsule   Oral   Take 2 mg by mouth daily as needed for diarrhea or loose stools.          . midodrine (PROAMATINE) 5 MG tablet   Oral   Take 1 tablet (5 mg total) by mouth 3 (three) times daily with meals.   90 tablet   0   . Multiple Vitamin (MULTIVITAMIN WITH MINERALS) TABS tablet   Oral   Take 1 tablet by mouth daily.         . Vitamin D, Ergocalciferol, (DRISDOL) 50000 UNITS CAPS   Oral   Take 50,000 Units by mouth every Sunday.          BP 117/53  Pulse 104  Temp(Src) 98.5 F (36.9 C) (Oral)  Resp 18  SpO2 93% Physical Exam  Nursing note and vitals reviewed. Constitutional: She is oriented to person, place, and time. She appears well-developed and well-nourished. No distress.  HENT:  Head: Normocephalic and atraumatic.  Mouth/Throat: Oropharynx is clear and moist.  Eyes: Conjunctivae and EOM are normal. Pupils are  equal, round, and reactive to light.  Neck: Normal range of motion. Neck supple.  Cardiovascular: Normal rate, regular rhythm and normal heart sounds.   No murmur heard. Pulmonary/Chest: Effort normal and breath sounds normal. No respiratory distress.  Abdominal: Soft. Bowel sounds are normal. There is no tenderness.  Musculoskeletal: She exhibits no edema.  Neurological: She is alert and oriented to person, place, and time. No cranial nerve deficit. She exhibits normal muscle tone. Coordination normal.  Patient is back to her baseline neuro exam still has some mild dysphagia that is baseline the more severe dysphasia that occurred this morning has resolved.  Skin: Skin is warm. No rash noted.    ED Course  Procedures (including critical care time) Labs Review Labs Reviewed  DIFFERENTIAL - Abnormal; Notable for the following:    Neutrophils Relative % 79 (*)    Lymphocytes Relative 11 (*)    All other components within normal limits  COMPREHENSIVE METABOLIC PANEL - Abnormal; Notable for the following:    Glucose, Bld 133 (*)    Creatinine, Ser 1.15 (*)    GFR calc non Af Amer 43 (*)    GFR calc Af Amer 50 (*)    All other components within normal limits  URINALYSIS, ROUTINE W REFLEX MICROSCOPIC - Abnormal; Notable for the following:    Leukocytes, UA TRACE (*)    All other components within normal limits  URINE MICROSCOPIC-ADD ON - Abnormal; Notable for the following:    Squamous Epithelial / LPF FEW (*)    Casts HYALINE CASTS (*)    All other components within normal limits  POCT I-STAT, CHEM 8 - Abnormal; Notable for the following:    Creatinine, Ser 1.40 (*)    Glucose, Bld 135 (*)    All other components within normal limits  PROTIME-INR  APTT  CBC  TROPONIN I  POCT I-STAT TROPONIN I   Results for orders placed during the hospital encounter of 06/07/13  PROTIME-INR      Result Value Range   Prothrombin Time 14.6  11.6 - 15.2 seconds   INR 1.16  0.00 - 1.49  APTT       Result Value Range   aPTT 31  24 - 37 seconds  CBC      Result Value Range   WBC 8.3  4.0 - 10.5 K/uL   RBC 4.61  3.87 - 5.11 MIL/uL   Hemoglobin 13.7  12.0 - 15.0 g/dL   HCT 16.1  09.6 - 04.5 %   MCV 90.0  78.0 - 100.0 fL   MCH 29.7  26.0 - 34.0 pg   MCHC 33.0  30.0 - 36.0 g/dL   RDW 40.9  81.1 - 91.4 %   Platelets 192  150 - 400 K/uL  DIFFERENTIAL      Result Value Range   Neutrophils Relative % 79 (*) 43 - 77 %   Neutro Abs 6.5  1.7 - 7.7 K/uL   Lymphocytes Relative 11 (*) 12 - 46 %   Lymphs Abs 0.9  0.7 - 4.0 K/uL   Monocytes Relative 8  3 - 12 %   Monocytes Absolute 0.6  0.1 - 1.0 K/uL   Eosinophils Relative 2  0 - 5 %   Eosinophils Absolute 0.1  0.0 - 0.7 K/uL   Basophils Relative 0  0 - 1 %   Basophils Absolute 0.0  0.0 - 0.1 K/uL  COMPREHENSIVE METABOLIC PANEL      Result Value Range   Sodium 138  135 - 145 mEq/L   Potassium 4.1  3.5 - 5.1 mEq/L   Chloride 100  96 - 112 mEq/L   CO2 27  19 - 32 mEq/L   Glucose, Bld 133 (*) 70 - 99 mg/dL   BUN 12  6 -  23 mg/dL   Creatinine, Ser 4.09 (*) 0.50 - 1.10 mg/dL   Calcium 81.1  8.4 - 91.4 mg/dL   Total Protein 7.5  6.0 - 8.3 g/dL   Albumin 4.1  3.5 - 5.2 g/dL   AST 28  0 - 37 U/L   ALT 24  0 - 35 U/L   Alkaline Phosphatase 73  39 - 117 U/L   Total Bilirubin 0.8  0.3 - 1.2 mg/dL   GFR calc non Af Amer 43 (*) >90 mL/min   GFR calc Af Amer 50 (*) >90 mL/min  TROPONIN I      Result Value Range   Troponin I <0.30  <0.30 ng/mL  URINALYSIS, ROUTINE W REFLEX MICROSCOPIC      Result Value Range   Color, Urine YELLOW  YELLOW   APPearance CLEAR  CLEAR   Specific Gravity, Urine 1.011  1.005 - 1.030   pH 5.5  5.0 - 8.0   Glucose, UA NEGATIVE  NEGATIVE mg/dL   Hgb urine dipstick NEGATIVE  NEGATIVE   Bilirubin Urine NEGATIVE  NEGATIVE   Ketones, ur NEGATIVE  NEGATIVE mg/dL   Protein, ur NEGATIVE  NEGATIVE mg/dL   Urobilinogen, UA 0.2  0.0 - 1.0 mg/dL   Nitrite NEGATIVE  NEGATIVE   Leukocytes, UA TRACE (*) NEGATIVE  URINE  MICROSCOPIC-ADD ON      Result Value Range   Squamous Epithelial / LPF FEW (*) RARE   WBC, UA 0-2  <3 WBC/hpf   RBC / HPF 0-2  <3 RBC/hpf   Bacteria, UA RARE  RARE   Casts HYALINE CASTS (*) NEGATIVE  POCT I-STAT, CHEM 8      Result Value Range   Sodium 140  135 - 145 mEq/L   Potassium 3.9  3.5 - 5.1 mEq/L   Chloride 101  96 - 112 mEq/L   BUN 11  6 - 23 mg/dL   Creatinine, Ser 7.82 (*) 0.50 - 1.10 mg/dL   Glucose, Bld 956 (*) 70 - 99 mg/dL   Calcium, Ion 2.13  0.86 - 1.30 mmol/L   TCO2 25  0 - 100 mmol/L   Hemoglobin 15.0  12.0 - 15.0 g/dL   HCT 57.8  46.9 - 62.9 %  POCT I-STAT TROPONIN I      Result Value Range   Troponin i, poc 0.01  0.00 - 0.08 ng/mL   Comment 3             Imaging Review Ct Head (brain) Wo Contrast  06/07/2013   CLINICAL DATA:  Dysphasia. Nausea  EXAM: CT HEAD WITHOUT CONTRAST  TECHNIQUE: Contiguous axial images were obtained from the base of the skull through the vertex without intravenous contrast.  COMPARISON:  CT 05/31/2013  FINDINGS: Generalized atrophy. Moderate chronic microvascular ischemic change in the white matter. Negative for acute infarct. Negative for hemorrhage or mass lesion. Ventricle size is normal. No shift of the midline structures. Calvarium is intact.  IMPRESSION: Chronic microvascular ischemic change. No acute abnormality.   Electronically Signed   By: Marlan Palau M.D.   On: 06/07/2013 10:17    EKG Interpretation   None      Date: 06/07/2013  Rate: 62  Rhythm: normal sinus rhythm  QRS Axis: normal  Intervals: normal  ST/T Wave abnormalities: normal  Conduction Disutrbances:none  Narrative Interpretation:   Old EKG Reviewed: none available    MDM   1. TIA (transient ischemic attack)    Patient with most likely recurrent TIA this  morning with dysphasia symptoms have resolved. Patient was admitted October 29 and October 24 for similar symptoms. Patient's MRI at last admission showed some abnormalities. Discussed with  neurology they will see and admit the we'll get an MRI. Patient's head CT without any acute changes. Labs without any significant abnormalities.    Shelda Jakes, MD 06/07/13 1505  Shelda Jakes, MD 06/07/13 [phone]

## 2013-06-07 NOTE — ED Notes (Signed)
Pt's daughter very upset that the physician hasn't come in to see the pt yet. Explained to family member that it has been a very busy day with extremely critical patients and he will be in as soon as he can, and I will go speak with him about a time frame he thinks he will be in to assess the pt.

## 2013-06-07 NOTE — ED Notes (Signed)
EEG tech called, report they are sending for pt shortly.

## 2013-06-07 NOTE — Progress Notes (Signed)
Family at bedside, patient upset because she was told in the ED that she could eat she gets to the floor and is NPO. Nigh MD patel paged to [facility] phone.

## 2013-06-07 NOTE — ED Notes (Signed)
Report called to tina on 3 west

## 2013-06-07 NOTE — Progress Notes (Signed)
Portable EEG completed

## 2013-06-07 NOTE — ED Notes (Signed)
Family reports they think the symptoms are resolving, but this morning when she woke up she had difficulty speaking and walking. Pt not showing any symptoms at this time.

## 2013-06-07 NOTE — ED Notes (Signed)
Pt returned, transported told that the test was cancelled.

## 2013-06-08 ENCOUNTER — Inpatient Hospital Stay (HOSPITAL_COMMUNITY): Payer: Medicare Other

## 2013-06-08 DIAGNOSIS — R4789 Other speech disturbances: Secondary | ICD-10-CM

## 2013-06-08 DIAGNOSIS — D649 Anemia, unspecified: Secondary | ICD-10-CM

## 2013-06-08 DIAGNOSIS — N179 Acute kidney failure, unspecified: Secondary | ICD-10-CM

## 2013-06-08 LAB — CBC
HCT: 39.2 % (ref 36.0–46.0)
Hemoglobin: 12.8 g/dL (ref 12.0–15.0)
MCHC: 32.7 g/dL (ref 30.0–36.0)
MCV: 90.3 fL (ref 78.0–100.0)
Platelets: 182 10*3/uL (ref 150–400)
RBC: 4.34 MIL/uL (ref 3.87–5.11)
RDW: 14.2 % (ref 11.5–15.5)
WBC: 4.9 10*3/uL (ref 4.0–10.5)

## 2013-06-08 LAB — BASIC METABOLIC PANEL
BUN: 14 mg/dL (ref 6–23)
CO2: 23 mEq/L (ref 19–32)
Calcium: 9.5 mg/dL (ref 8.4–10.5)
Chloride: 106 mEq/L (ref 96–112)
Creatinine, Ser: 1.01 mg/dL (ref 0.50–1.10)
GFR calc Af Amer: 58 mL/min — ABNORMAL LOW (ref >90)
Glucose, Bld: 102 mg/dL — ABNORMAL HIGH (ref 70–99)

## 2013-06-08 LAB — VITAMIN B12: Vitamin B-12: 804 pg/mL (ref 211–911)

## 2013-06-08 MED ORDER — LEVETIRACETAM 250 MG PO TABS
250.0000 mg | ORAL_TABLET | Freq: Two times a day (BID) | ORAL | Status: DC
  Administered 2013-06-08: 250 mg via ORAL
  Filled 2013-06-08 (×2): qty 1

## 2013-06-08 MED ORDER — LEVETIRACETAM 250 MG PO TABS: 250.0000 mg | ORAL_TABLET | Freq: Two times a day (BID) | ORAL | Status: DC

## 2013-06-08 NOTE — Progress Notes (Signed)
06-08-13 1131 Case Manager did call [facility] Case Manager Quentin Angst to see if she can f/u with pt once d/c to home. Pt has had multiple admissions within the month. Gala Lewandowsky, RN,BSN [phone]

## 2013-06-08 NOTE — Procedures (Signed)
EEG report.  Brief clinical history:  79 years old female with prior stroke and recurrent episodes of acute dysphasia and now with who sustained an isolated episode of brief alteration of consciousness. Technique: this is a 17 channel routine scalp EEG performed at the bedside with bipolar and monopolar montages arranged in accordance to the international 10/20 system of electrode placement. One channel was dedicated to EKG recording.  The study was performed during wakefulness and drowsiness. No activating procedures performed.  Description:In the wakeful state, the best background consisted of a medium amplitude, posterior dominant, well sustained, symmetric and reactive 10 Hz rhythm. Drowsiness demonstrated dropout of the alpha rhythm. No focal or generalized epileptiform discharges noted.  Infrequent, intermittent left fronto-parieto-temporal theta slowing seen.  EKG showed sinus rhythm.  Impression: this is an abnormal awake and drowsy EEG because of the presence of infrequent, intermittent left fronto-parieto-temporal theta slowing seen which indicates focal dysfunction arising from the same region. The overall findings are not consistent with epilepsy but could be related to prior stroke over the left brain.  Clinical correlation is advised.   Wyatt Portela, MD

## 2013-06-08 NOTE — Progress Notes (Signed)
Pt's blood pressure 80/56 manually, c/o of dizziness. No neurological deficits, LOC intact. Programmer, applications paged and ordred 500cc bolus.    Bolus given with improvement in BP and dizziness. Will continue to monitor.

## 2013-06-08 NOTE — Care Management (Signed)
06-08-13 1409 CM did [facility]. Pt is aware. Rehab will call pt to set up a visit time. Gala Lewandowsky, RN,BSN [phone]

## 2013-06-08 NOTE — Evaluation (Signed)
Physical Therapy Evaluation Patient Details Name: [name] MRN: [id number] DOB: 1928/02/28 Today's Date: 06/08/2013 Time: 8657-8469 PT Time Calculation (min): 30 min  PT Assessment / Plan / Recommendation History of Present Illness  79 y.o. female, with recent 2 admissions suspicious for CVA and then TIA, switched from Pradaxa to Eliquis 2 admissions ago recently, severe orthostatic hypotension, peptic ulcer disease, PAD, depression, COPD, hypertension, CAD, DVT in the past, comes in for a another episode of forgetfulness, disoriented behavior and slurred speech which started sometime last night, patient does not recall going to the bed, she did not wear her usual clothes last night and was wearing only a T-shirt when she went to bed which is very unusual for her, husband saw her being extremely anxious the whole night trying to get out of the bed, this morning she was noted wearing some strange clothes and standing in the closet looking lost, she was also found to have slurred speech, and then brought to the ER where she was seen by neurologist, an MRI MRA of the brain was done which showed no new changes, stroke was ruled out and hospitalist service was requested to admit the patient for further workup including workup for seizures  Clinical Impression  Pt presents with generalized unsteadiness with gait, but no LOB while ambulating with MIN/guard A to S.  Pt trialed RW, but states she will not use one.  Husband and pt educated on having S with her with gait, and especially with stairs.  Will follow patient acutely to work towards improving balance and decreasing fall risk.  Recommend outpatient PT which pt was about to start this week.    PT Assessment  Patient needs continued PT services    Follow Up Recommendations  Outpatient PT;Supervision for mobility/OOB    Does the patient have the potential to tolerate intense rehabilitation      Barriers to Discharge        Equipment  Recommendations  None recommended by PT    Recommendations for Other Services     Frequency Min 3X/week    Precautions / Restrictions Precautions Precautions: Fall   Pertinent Vitals/Pain Denies pain.      Mobility  Bed Mobility Supine to Sit: 6: Modified independent (Device/Increase time) Sitting - Scoot to Edge of Bed: 6: Modified independent (Device/Increase time) Transfers Transfers: Sit to Stand Sit to Stand: 5: Supervision Stand to Sit: 5: Supervision Details for Transfer Assistance: wide BOS and cues for hand placement. Ambulation/Gait Ambulation/Gait Assistance: 4: Min guard;5: Supervision Ambulation Distance (Feet): 200 Feet Assistive device: None Ambulation/Gait Assistance Details: Pt demonstrating generalized unsteadiness and had difficulty maintaining straight path over longer distance.  Pt ambulated 200' without ad and 150 with RW.  Pt demonstrated improved safety with RW, but states she will not use it at home.   Gait Pattern: Step-through pattern (increased L knee flex at time during stance)    Exercises     PT Diagnosis: Difficulty walking  PT Problem List: Decreased balance;Decreased mobility;Decreased coordination;Decreased safety awareness;Decreased knowledge of use of DME PT Treatment Interventions: Gait training;Stair training;Functional mobility training;Therapeutic activities;Therapeutic exercise     PT Goals(Current goals can be found in the care plan section) Acute Rehab PT Goals Patient Stated Goal: to go home. PT Goal Formulation: With patient Time For Goal Achievement: 06/22/13 Potential to Achieve Goals: Good  Visit Information  Last PT Received On: 06/08/13 History of Present Illness: 79 y.o. female, with recent 2 admissions suspicious for CVA and then TIA,  switched from Pradaxa to Eliquis 2 admissions ago recently, severe orthostatic hypotension, peptic ulcer disease, PAD, depression, COPD, hypertension, CAD, DVT in the past, comes in for a  another episode of forgetfulness, disoriented behavior and slurred speech which started sometime last night, patient does not recall going to the bed, she did not wear her usual clothes last night and was wearing only a T-shirt when she went to bed which is very unusual for her, husband saw her being extremely anxious the whole night trying to get out of the bed, this morning she was noted wearing some strange clothes and standing in the closet looking lost, she was also found to have slurred speech, and then brought to the ER where she was seen by neurologist, an MRI MRA of the brain was done which showed no new changes, stroke was ruled out and hospitalist service was requested to admit the patient for further workup including workup for seizures       Prior Functioning  Home Living Family/patient expects to be discharged to:: Private residence Living Arrangements: Spouse/significant other Available Help at Discharge: Available 24 hours/day;Family Type of Home: House Home Access: Level entry Home Layout: Multi-level;1/2 bath on main level Alternate Level Stairs-Number of Steps: flight Alternate Level Stairs-Rails: Right Home Equipment: Walker - 2 wheels;Bedside commode;Shower seat  Lives With: Spouse Prior Function Level of Independence: Independent Comments: Patient had just resumed OP PT for knees Communication Communication: Expressive difficulties Dominant Hand: Right    Cognition  Cognition Overall Cognitive Status: History of cognitive impairments - at baseline General Comments: Pt with some difficulty with word finding at various times during eval.    Extremity/Trunk Assessment Upper Extremity Assessment Upper Extremity Assessment: Defer to OT evaluation Lower Extremity Assessment Lower Extremity Assessment: Overall WFL for tasks assessed Cervical / Trunk Assessment Cervical / Trunk Assessment: Normal   Balance Dynamic Standing Balance Dynamic Standing - Balance Support: No  upper extremity supported Dynamic Standing - Level of Assistance: 5: Stand by assistance High Level Balance High Level Balance Activites: Side stepping;Backward walking;Other (comment) (tandem) High Level Balance Comments: Performed with MIN/guard  End of Session PT - End of Session Equipment Utilized During Treatment: Gait belt Activity Tolerance: Patient tolerated treatment well Patient left: in chair;with family/visitor present Nurse Communication: Mobility status  GP     Cass Edinger LUBECK 06/08/2013, 11:45 AM

## 2013-06-08 NOTE — Progress Notes (Signed)
UR Completed Magic Mohler Graves-Bigelow, RN,BSN [phone]  

## 2013-06-08 NOTE — Progress Notes (Signed)
Stroke Team Progress Note  HISTORY [name] is an 79 y.o. female female with a recent infarct who presents with recurrent aphasia 05/31/2013. At that time imaging confirmed a new punctate left corona radiata infarct. Infarct felt to be embolic secondary to afib. Patient is and was on Eliquis for stroke prevention. Per family patient was not sleeping well last night. At one point her husband found her standing in her closet naked and then she put on a large "summer dress with a sweater hanging out of the arm". Patient does not recall this. Upon waking at 6:30 this am she was noted to have expressive difficulties, gait was unbalanced and she was nauseated. Symptoms have fully resolved at this time. Daughter understands she is on Eliquis and this was just initiated.   Date last known well: Date: 06/06/2013  Time last known well: Unable to determine  tPA Given: No: symptoms resolved   She was admitted for further evaluation and treatment.  SUBJECTIVE  Overall she feels her condition is improved.   OBJECTIVE Most recent Vital Signs: Filed Vitals:   06/08/13 0849 06/08/13 0851 06/08/13 0853 06/08/13 0856  BP: 118/69 94/69 103/59 97/62  Pulse: 85 91 102 70  Temp:      TempSrc:      Resp:      Height:      Weight:      SpO2: 94% 95% 94% 94%   CBG (last 3)  No results found for this basename: GLUCAP,  in the last 72 hours  IV Fluid Intake:   . 0.9 % NaCl with KCl 20 mEq / L 75 mL/hr at 06/07/13 2011    MEDICATIONS  . apixaban  2.5 mg Oral BID  . bisoprolol  5 mg Oral Q1200  . midodrine  5 mg Oral TID WC  . multivitamin with minerals  1 tablet Oral Daily  . vitamin B-12  100 mcg Oral Daily  . [START ON 06/11/2013] Vitamin D (Ergocalciferol)  50,000 Units Oral Q Sun   PRN:  albuterol, ALPRAZolam, diclofenac sodium, guaiFENesin-dextromethorphan, HYDROcodone-acetaminophen, loperamide, ondansetron (ZOFRAN) IV, ondansetron, polyethylene glycol  Diet:  General thin  liquids Activity:  DVT Prophylaxis:  SCD  CLINICALLY SIGNIFICANT STUDIES Basic Metabolic Panel:  Recent Labs Lab 06/07/13 0938 06/07/13 0956 06/08/13 0527  NA 138 140 141  K 4.1 3.9 4.0  CL 100 101 106  CO2 27  --  23  GLUCOSE 133* 135* 102*  BUN 12 11 14   CREATININE 1.15* 1.40* 1.01  CALCIUM 10.4  --  9.5   Liver Function Tests:  Recent Labs Lab 06/07/13 0938  AST 28  ALT 24  ALKPHOS 73  BILITOT 0.8  PROT 7.5  ALBUMIN 4.1   CBC:  Recent Labs Lab 06/07/13 0938 06/07/13 0956 06/08/13 0527  WBC 8.3  --  4.9  NEUTROABS 6.5  --   --   HGB 13.7 15.0 12.8  HCT 41.5 44.0 39.2  MCV 90.0  --  90.3  PLT 192  --  182   Coagulation:  Recent Labs Lab 06/07/13 0938  LABPROT 14.6  INR 1.16   Cardiac Enzymes:  Recent Labs Lab 06/07/13 0938  TROPONINI <0.30   Urinalysis:  Recent Labs Lab 06/07/13 1415  COLORURINE YELLOW  LABSPEC 1.011  PHURINE 5.5  GLUCOSEU NEGATIVE  HGBUR NEGATIVE  BILIRUBINUR NEGATIVE  KETONESUR NEGATIVE  PROTEINUR NEGATIVE  UROBILINOGEN 0.2  NITRITE NEGATIVE  LEUKOCYTESUR TRACE*   Lipid Panel    Component Value Date/Time  CHOL 163 05/27/2013 0425   TRIG 176* 05/27/2013 0425   HDL 31* 05/27/2013 0425   CHOLHDL 5.3 05/27/2013 0425   VLDL 35 05/27/2013 0425   LDLCALC 97 05/27/2013 0425   HgbA1C  Lab Results  Component Value Date   HGBA1C 5.9* 05/27/2013    Urine Drug Screen:     Component Value Date/Time   LABOPIA NONE DETECTED 05/26/2013 1817   COCAINSCRNUR NONE DETECTED 05/26/2013 1817   LABBENZ NONE DETECTED 05/26/2013 1817   AMPHETMU NONE DETECTED 05/26/2013 1817   THCU NONE DETECTED 05/26/2013 1817   LABBARB NONE DETECTED 05/26/2013 1817    Alcohol Level: No results found for this basename: ETH,  in the last 168 hours  Dg Chest 2 View 06/08/2013    No active lung disease. Mild peribronchial thickening. Stable cardiomegaly.     Ct Head (brain) Wo Contrast 06/07/2013   Chronic microvascular ischemic change. No  acute abnormality.    Mr Shirlee Latch Wo Contrast 06/07/2013   Widely patent internal carotid arteries. Basilar artery widely patent with left vertebral dominant. Right vertebral maintains a hypoplastic connection to the basilar and appears to primarily supply the PICA. There is no proximal intracranial stenosis or aneurysm.    Mr Brain Wo Contrast 06/07/2013   Pseudonormalization on DWI series of the more acute punctate nonhemorrhagic infarct (demonstrated on MR 05/31/2013) involving the posterior left coronal radiata. The older subacute infarct of the left lentiform nucleus and anterior coronal radiata shows typical evolutionary change with "normalized" peripheral hyperintensity on DWI sequence but some central restricted diffusion. No intervening hemorrhage within the infarct.  There is a subcentimeter focus of T1 shortening in the posterior putamen on the left. This could be located at the very lower most extent of the infarct based on prior images showing the area of acute restricted diffusion at its largest. I cannot exclude a tiny focus of subacute microhemorrhage in the left putamen as this area has evolved compared with 10/25 and 10/29. This area is not visible on today's CT scan.  No new infarcts. Continued atrophy with chronic microvascular ischemic change.   2D Echocardiogram   EF 55%, no cardiac soure of embolism identified.  Carotid Doppler   Right: mild mixed plaque distal CCA and origin ICA. Left: mild calcific plaque distal CCA. Mild to moderate calcific plaque origin and proximal ICA. RICA 1-39% ICA stenosis. LICA 20-49% stenosis.Vertebral artery flow is antegrade  EEG this is an abnormal awake and drowsy EEG because of the presence of infrequent, intermittent left fronto-parieto-temporal theta slowing seen which indicates focal dysfunction arising from the same region. The overall findings are not consistent with epilepsy but could be related to prior stroke over the left brain.   CXR     EKG  normal sinus rhythm.   Therapy Recommendations outpatient therapy   Physical Exam   Alert, oriented, thought content appropriate. Speech fluent without evidence of aphasia. Able to follow 3 step commands without difficulty.  Cranial Nerves:  II: Discs flat bilaterally; Visual fields grossly normal, pupils equal, round, reactive to light and accommodation  III,IV, VI: ptosis not present, extra-ocular motions intact bilaterally  V,VII: smile symmetric, facial light touch sensation normal bilaterally  VIII: hearing normal bilaterally  IX,X: gag reflex present  XI: bilateral shoulder shrug  XII: midline tongue extension without atrophy or fasciculations  Motor:  Right : Upper extremity 5/5 Left: Upper extremity 5/5  Lower extremity 5/5 Lower extremity 5/5  Tone and bulk:normal tone throughout; no atrophy noted  Sensory: Pinprick and light touch intact throughout, bilaterally  Deep Tendon Reflexes:  1+ throughout UE and no KJ or AJ  Plantars:  Right: downgoing Left: downgoing  Cerebellar:  normal finger-to-nose, normal heel-to-shin test  Gait: not assessed  CV: pulses palpable throughout    ASSESSMENT [name] is a 79 y.o. female presenting with recurrent aphasia, gait disorder and acute delirium. Imaging confirms a pseudonormalization on DWI series of the more acute punctate nonhemorrhagic infarct (demonstrated on MR 05/31/2013) involving the posterior left coronal radiata. The older subacute infarct of the left lentiform nucleus and anterior coronal radiata shows typical evolutionary change with "normalized" peripheral hyperintensity on DWI sequence but some central restricted diffusion. Infarct felt to be  embolic secondary to afib.  On eliquis prior to admission. Now on eliquis for secondary stroke prevention. Patient with resultant gait disorder and dysarthria. Work up complete.   Hyperlipidemia, LDL 97, at goal no statin indicated.  Hypertension  History  of DVT  Previous history of stroke  Coronary artery disease  Laryngeal carcinoma  HGB A1C  5.9  Hospital day # 1  TREATMENT/PLAN  Continue eliquis for secondary stroke prevention.  Add Keppra 250mg  twice a day for abnormal EEG as patient had confusion concerning for seizure  Risk factor modification  Have patient follow up with Dr. Pearlean Brownie in 2 months in stroke clinic  Larita Fife D. Manson Passey, [facility], MBA, MHA [facility] Pager: [phone] 06/08/2013 1:03 PM  I have personally obtained a history, examined the patient, evaluated imaging results, and formulated the assessment and plan of care. I agree with the above. Delia Heady, MD

## 2013-06-08 NOTE — Discharge Summary (Signed)
Physician Discharge Summary  Patient ID: [name] MRN: [id number] DOB/AGE: 1928-04-11 79 y.o.  Admit date: 06/07/2013 Discharge date: 06/08/2013  Primary Care Physician:  [name], MD  Discharge Diagnoses:    . acute encephalopathy with slurred speech -resolved possibly seizure activity  . Atrial fibrillation . COPD . HYPERLIPIDEMIA . TIA (transient ischemic attack) with previous history of stroke . mild AKI . Large B-cell lymphoma . HYPERTENSION  Consults:  Neurology, Dr Pearlean Brownie   Recommendations for Outpatient Follow-up:    Allergies:   Allergies  Allergen Reactions  . Propoxyphene Hcl     Rxn: unknown     Discharge Medications:   Medication List         ALPRAZolam 0.5 MG tablet  Commonly known as:  XANAX  Take 1 tablet (0.5 mg total) by mouth at bedtime as needed for sleep.     apixaban 2.5 MG Tabs tablet  Commonly known as:  ELIQUIS  Take 1 tablet (2.5 mg total) by mouth 2 (two) times daily.     bisoprolol 5 MG tablet  Commonly known as:  ZEBETA  Take 5 mg by mouth daily at 12 noon.     diclofenac sodium 1 % Gel  Commonly known as:  VOLTAREN  Apply 2 g topically 4 (four) times daily as needed (pain; apply to hand).     levETIRAcetam 250 MG tablet  Commonly known as:  KEPPRA  Take 1 tablet (250 mg total) by mouth 2 (two) times daily.     loperamide 2 MG capsule  Commonly known as:  IMODIUM  Take 2 mg by mouth daily as needed for diarrhea or loose stools.     midodrine 5 MG tablet  Commonly known as:  PROAMATINE  Take 1 tablet (5 mg total) by mouth 3 (three) times daily with meals.     multivitamin with minerals Tabs tablet  Take 1 tablet by mouth daily.     VITAMIN B12 PO  Take 5,000 mcg by mouth daily.     Vitamin D (Ergocalciferol) 50000 UNITS Caps capsule  Commonly known as:  DRISDOL  Take 50,000 Units by mouth every Sunday.         Brief H and P: For complete details please refer to admission H and P, but in  brief the patient is 79 year old femalewith recent 2 admissions suspicious for CVA and then TIA, switched from Pradaxa to Eliquis 2 admissions ago recently, severe orthostatic hypotension, peptic ulcer disease, PAD, depression, COPD, hypertension, CAD, DVT in the past, presented with episode of forgetfulness, disoriented behavior and slurred speech which started sometime in the night before admission. Patient could not recall going to bed, she  did not wear her usual clothes and was wearing only a T-shirt when she went to bed which is very unusual for her, husband saw her being extremely anxious the whole night trying to get out of the bed. On the morning of admission, she was noted wearing some strange clothes and standing in the closet looking lost, she was also found to have slurred speech, and then brought to the ER where she was seen by neurologist. MRI MRA of the brain was done which showed no new changes, stroke was ruled out and hospitalist service was requested to admit the patient for further workup including workup for seizures.  At the time of admission, patient was close to her baseline and symptom free, she was not a TPA candidate. She did report some loose stools over the last  few days, other than that negative review of systems. Noticeably no headache, no chest pain or palpitations, no abdominal pain, no focal weakness.   Hospital Course:  Patient is 79 year old female with recent history of stroke presented with recurrent aphasia, confusion and acute encephalopathy. Patient has been on eliquis for stroke prevention. Patient was not acting quite right at home, patient was thought to have another TIA and was admitted for further workup. Neurology consultation was obtained and patient was seen by Dr. Cyril Mourning who recommended EEG and another MRI to rule out acute stroke. MRI of the brain showed no new infarction and evolutionary change of the previously identified left deep white matter and basal  ganglia infarcts, possible subcentimeter subacute hemorrhage in the left posterior putamen. EEG showed infrequent intermittent left frontoparietal temporal theta slowing which indicates focal dysfunction arising from the same region. Patient was seen by stroke service, Dr. Pearlean Brownie and recommended starting patient on Keppra for seizure prophylaxis. Dr. Pearlean Brownie also recommended to continue eliquis for secondary stroke prevention and followup in 2 months. Patient had extensive stroke workup, echo, carotid duplex during the previous admission.    Day of Discharge BP 149/127  Pulse 98  Temp(Src) 97.9 F (36.6 C) (Oral)  Resp 20  Ht 5' 5.5" (1.664 m)  Wt 80.287 kg (177 lb)  BMI 29.00 kg/m2  SpO2 94%  Physical Exam: General: Alert and awake oriented x3 not in any acute distress. CVS: S1-S2 clear no murmur rubs or gallops Chest: clear to auscultation bilaterally, no wheezing rales or rhonchi Abdomen: soft nontender, nondistended, normal bowel sounds, no organomegaly Extremities: no cyanosis, clubbing or edema noted bilaterally Neuro: Cranial nerves II-XII intact, no focal neurological deficits   The results of significant diagnostics from this hospitalization (including imaging, microbiology, ancillary and laboratory) are listed below for reference.    LAB RESULTS: Basic Metabolic Panel:  Recent Labs Lab 06/07/13 0938 06/07/13 0956 06/08/13 0527  NA 138 140 141  K 4.1 3.9 4.0  CL 100 101 106  CO2 27  --  23  GLUCOSE 133* 135* 102*  BUN 12 11 14   CREATININE 1.15* 1.40* 1.01  CALCIUM 10.4  --  9.5   Liver Function Tests:  Recent Labs Lab 06/07/13 0938  AST 28  ALT 24  ALKPHOS 73  BILITOT 0.8  PROT 7.5  ALBUMIN 4.1   No results found for this basename: LIPASE, AMYLASE,  in the last 168 hours No results found for this basename: AMMONIA,  in the last 168 hours CBC:  Recent Labs Lab 06/07/13 0938 06/07/13 0956 06/08/13 0527  WBC 8.3  --  4.9  NEUTROABS 6.5  --   --    HGB 13.7 15.0 12.8  HCT 41.5 44.0 39.2  MCV 90.0  --  90.3  PLT 192  --  182   Cardiac Enzymes:  Recent Labs Lab 06/07/13 0938  TROPONINI <0.30   BNP: No components found with this basename: POCBNP,  CBG: No results found for this basename: GLUCAP,  in the last 168 hours  Significant Diagnostic Studies:  Dg Chest 2 View  06/08/2013   CLINICAL DATA:  History of stroke  EXAM: CHEST  2 VIEW  COMPARISON:  Chest x-ray of 03/27/2012 this Port-A-Cath appears to have been removed. Aeration has improved with resolution of the opacity in the periphery  FINDINGS: The heart size and mediastinal contours are within normal limits. No active infiltrate or effusion is seen. There is mild peribronchial thickening present. The Port-A-Cath has been  removed. Mild cardiomegaly is stable. Mild degenerative changes present throughout the thoracic spine  IMPRESSION: No active lung disease. Mild peribronchial thickening. Stable cardiomegaly.   Electronically Signed   By: Dwyane Dee M.D.   On: 06/08/2013 07:59   Ct Head (brain) Wo Contrast  06/07/2013   CLINICAL DATA:  Dysphasia. Nausea  EXAM: CT HEAD WITHOUT CONTRAST  TECHNIQUE: Contiguous axial images were obtained from the base of the skull through the vertex without intravenous contrast.  COMPARISON:  CT 05/31/2013  FINDINGS: Generalized atrophy. Moderate chronic microvascular ischemic change in the white matter. Negative for acute infarct. Negative for hemorrhage or mass lesion. Ventricle size is normal. No shift of the midline structures. Calvarium is intact.  IMPRESSION: Chronic microvascular ischemic change. No acute abnormality.   Electronically Signed   By: Marlan Palau M.D.   On: 06/07/2013 10:17   [name] Head Wo Contrast  06/07/2013   CLINICAL DATA:  Recurrent aphasia. Recent infarct. Patient is anticoagulated.  EXAM: MRI HEAD WITHOUT CONTRAST  MRA HEAD WITHOUT CONTRAST  TECHNIQUE: Multiplanar, multiecho pulse sequences of the brain and surrounding  structures were obtained without intravenous contrast. Angiographic images of the head were obtained using MRA technique without contrast.  COMPARISON:  MR brain 05/31/2013. MR brain 05/27/2013. CT head earlier today.  FINDINGS: MRI HEAD FINDINGS  Pseudonormalization on DWI series of the more acute punctate nonhemorrhagic infarct (demonstrated on MR 05/31/2013) involving the posterior left coronal radiata. The older subacute infarct of the left lentiform nucleus and anterior coronal radiata shows typical evolutionary change with "normalized" peripheral hyperintensity on DWI sequence but some central restricted diffusion. No intervening hemorrhage within the infarct.  There is a subcentimeter focus of T1 shortening in the posterior putamen on the left. This could be located at the very lower most extent of the infarct based on prior images showing the area of acute restricted diffusion at its largest. I cannot exclude a tiny focus of subacute microhemorrhage in the left putamen as this area has evolved compared with 10/25 and 10/29. This area is not visible on today's CT scan.  No new infarcts. Continued atrophy with chronic microvascular ischemic change.  MRA HEAD FINDINGS  Widely patent internal carotid arteries. Basilar artery widely patent with left vertebral dominant. Right vertebral maintains a hypoplastic connection to the basilar and appears to primarily supply the PICA. There is no proximal intracranial stenosis or aneurysm.  IMPRESSION: No new infarction is evident. Typical evolutionary change of the previously identified left deep white matter and basal ganglia infarcts.  There is a subcentimeter focus of T1 shortening, possible subacute hemorrhage, in the left posterior putamen marginally adjacent to the area of previous acute infarction displayed on 05/27/2013. Significance uncertain.  Unremarkable MRA intracranial circulation.   Electronically Signed   By: Davonna Belling M.D.   On: 06/07/2013 15:44   Mr  Brain Wo Contrast  06/07/2013   CLINICAL DATA:  Recurrent aphasia. Recent infarct. Patient is anticoagulated.  EXAM: MRI HEAD WITHOUT CONTRAST  MRA HEAD WITHOUT CONTRAST  TECHNIQUE: Multiplanar, multiecho pulse sequences of the brain and surrounding structures were obtained without intravenous contrast. Angiographic images of the head were obtained using MRA technique without contrast.  COMPARISON:  MR brain 05/31/2013. MR brain 05/27/2013. CT head earlier today.  FINDINGS: MRI HEAD FINDINGS  Pseudonormalization on DWI series of the more acute punctate nonhemorrhagic infarct (demonstrated on MR 05/31/2013) involving the posterior left coronal radiata. The older subacute infarct of the left lentiform nucleus and anterior coronal radiata  shows typical evolutionary change with "normalized" peripheral hyperintensity on DWI sequence but some central restricted diffusion. No intervening hemorrhage within the infarct.  There is a subcentimeter focus of T1 shortening in the posterior putamen on the left. This could be located at the very lower most extent of the infarct based on prior images showing the area of acute restricted diffusion at its largest. I cannot exclude a tiny focus of subacute microhemorrhage in the left putamen as this area has evolved compared with 10/25 and 10/29. This area is not visible on today's CT scan.  No new infarcts. Continued atrophy with chronic microvascular ischemic change.  MRA HEAD FINDINGS  Widely patent internal carotid arteries. Basilar artery widely patent with left vertebral dominant. Right vertebral maintains a hypoplastic connection to the basilar and appears to primarily supply the PICA. There is no proximal intracranial stenosis or aneurysm.  IMPRESSION: No new infarction is evident. Typical evolutionary change of the previously identified left deep white matter and basal ganglia infarcts.  There is a subcentimeter focus of T1 shortening, possible subacute hemorrhage, in the  left posterior putamen marginally adjacent to the area of previous acute infarction displayed on 05/27/2013. Significance uncertain.  Unremarkable MRA intracranial circulation.   Electronically Signed   By: Davonna Belling M.D.   On: 06/07/2013 15:44      Disposition and Follow-up:     Discharge Orders   Future Appointments Provider Department Dept Phone   06/09/2013 2:15 PM Baker Pierini, FNP [facility] [place] [phone]   08/10/2013 9:45 AM Rana Snare, NP [facility] MEDICAL ONCOLOGY [phone]   09/11/2013 11:30 AM Stacie Glaze, MD [facility] [place] [phone]   Future Orders Complete By Expires   Diet - low sodium heart healthy  As directed    Increase activity slowly  As directed        DISPOSITION:  home DIET:  heart healthy diet ACTIVITY:  as tolerated  DISCHARGE FOLLOW-UP Follow-up Information   Follow up with [name], MD. Schedule an appointment as soon as possible for a visit in 10 days.   Specialty:  Internal Medicine   Contact information:   [address] [place] [address] [phone]       Follow up with Gates Rigg, MD. Schedule an appointment as soon as possible for a visit in 2 months.   Specialties:  Neurology, Radiology   Contact information:   [address] [place] [address] [phone]       Time spent on Discharge:  35 minutes  Signed:   RAI,RIPUDEEP M.D. [facility] 06/08/2013, 1:32 PM Pager: [phone]

## 2013-06-08 NOTE — Evaluation (Signed)
Speech Language Pathology Evaluation Patient Details Name: [name] MRN: [id number] DOB: 07-14-1928 Today's Date: 06/08/2013 Time: 1308-6578 SLP Time Calculation (min): 29 min  Problem List:  Patient Active Problem List   Diagnosis Date Noted  . Slurred speech 05/31/2013  . TIA (transient ischemic attack) 05/31/2013  . AKI (acute kidney injury) 05/31/2013  . Aphasia 05/26/2013  . Bilateral hand pain 01/09/2013  . Fever 03/27/2012  . Dehydration 03/27/2012  . Large B-cell lymphoma 02/18/2012  . Hemorrhage of rectum and anus 01/19/2012  . Family history of malignant neoplasm of gastrointestinal tract 01/19/2012  . Bursitis of shoulder, right, adhesive 10/30/2010  . ABDOMINAL INCISIONAL HERNIA 04/03/2010  . KNEE JOINT REPLACEMENT BY OTHER MEANS 02/21/2010  . CHEST PAIN 12/23/2009  . DIARRHEA 09/11/2009  . NECK AND BACK PAIN 03/18/2009  . VARICOSE VEINS LOWER EXTREMITIES W/INFLAMMATION 01/15/2009  . EDEMA 01/15/2009  . Atrial fibrillation 12/14/2008  . ANEMIA-NOS 10/20/2008  . ANXIETY 10/20/2008  . DEPRESSION 10/20/2008  . PERIPHERAL VASCULAR DISEASE 10/20/2008  . WEAKNESS 10/20/2008  . OTHER DYSPHAGIA 10/20/2008  . DIVERTICULITIS OF COLON WITH HEMORRHAGE 07/24/2008  . FISTULA OF INTESTINE EXCLUDING RECTUM AND ANUS 07/10/2008  . PEPTIC ULCER DISEASE 07/06/2008  . DYSPNEA 07/06/2008  . LOC OSTEOARTHROS NOT SPEC WHETHER PRIM/SEC HAND 04/03/2008  . COPD 03/26/2008  . HAIR LOSS 10/06/2007  . GERD 05/03/2007  . OSTEOARTHROSIS, LOCAL NOS, LOWER LEG 05/03/2007  . OSTEOARTHRITIS 05/03/2007  . HYPERLIPIDEMIA 03/18/2007  . HYPERTENSION 03/18/2007   Past Medical History:  Past Medical History  Diagnosis Date  . GERD (gastroesophageal reflux disease)   . History of TIAs   . Diverticulosis   . PUD (peptic ulcer disease)   . PVD (peripheral vascular disease)   . Hyperlipidemia   . Anxiety   . Depression   . Blood transfusion     "w/1st miscarriage" (05/26/2013)  .  COPD (chronic obstructive pulmonary disease)   . Hypertension   . PONV (postoperative nausea and vomiting)   . Family history of anesthesia complication     "sisters also get sick as a dog" (05/26/2013)  . Coronary artery disease   . Heart murmur     "when I was a child" (05/26/2013)  . DVT (deep venous thrombosis) 1954    "after I had my first child" (05/26/2013)  . Pneumonia 2013    "once" (05/26/2013)  . Exertional shortness of breath   . H/O hiatal hernia   . Stroke 10/2002    no residual  . Stroke 05/26/2013    "very mild; affected my speech for a while" (05/26/2013)  . Arthritis     "lots; hands" (05/26/2013)  . 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 Lymphona, mantle cell, inguinal region/lower limb     "large c-cell" (05/26/2013)   Past Surgical History:  Past Surgical History  Procedure Laterality Date  . 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 Tubal ligation  1972  . Radical neck dissection  1986    "larynx cancer" (05/26/2013)  . Total knee arthroplasty Bilateral     2002 left 2008 right  . Ileostomy closure  01/2010  . Cataract extraction  2005    bilateral  . Carotid endarterectomy Bilateral 2004    2004 a month apart right and left  . Tonsillectomy and adenoidectomy  1936    "tonsils grew back; no 2nd OR" (05/26/2013)  . Hernia repair  05/2010  ventral hernia repair  . Carpal tunnel release Left ?1960's  . Abdominal exploration surgery  12/01/2012  . Appendectomy  09/13/2008  . Colon surgery  09/13/2008    Laparoscopic assisted converted to open sigmoid colectomy.Hattie Perch 09/13/2008 (05/26/2013)  . Cataract extraction w/ intraocular lens  implant, bilateral Bilateral ~ 2003  . Dilation and curettage of uterus  1950's    "had 2 for miscarriages" (05/26/2013)   HPI:  79 y.o. female, with recent 2 admissions suspicious for CVA and then TIA, switched from  Pradaxa to Eliquis 2 admissions ago recently, severe orthostatic hypotension, peptic ulcer disease, PAD, depression, COPD, hypertension, CAD, DVT in the past, comes in for a another episode of forgetfulness, disoriented behavior and slurred speech which started sometime last night, patient does not recall going to the bed, she did not wear her usual clothes last night and was wearing only a T-shirt when she went to bed which is very unusual for her, husband saw her being extremely anxious the whole night trying to get out of the bed, this morning she was noted wearing some strange clothes and standing in the closet looking lost, she was also found to have slurred speech, and then brought to the ER where she was seen by neurologist, an MRI MRA of the brain was done which showed no new changes, stroke was ruled out and hospitalist service was requested to admit the patient for further workup including workup for seizures   Assessment / Plan / Recommendation Clinical Impression  Pt presents with mild-mod fluent aphasia consistent with status as of 10/30 eval.  Was scheduled for appt with OP SLP today prior to this admission.  Continues with deficits in word-retrieval, with difficulty increasing as propositionality increases.  Pt for likely D/C today - recommend continue with plan for OP speech services to address aphasia.  Pt/spouse agree.    SLP Assessment  All further Speech Lanaguage Pathology  needs can be addressed in the next venue of care    Follow Up Recommendations     OP Speech therapy   Frequency and Duration        Pertinent Vitals/Pain No pain   SLP Goals     SLP Evaluation Prior Functioning  Cognitive/Linguistic Baseline: Baseline deficits Baseline deficit details: aphasia Type of Home: House  Lives With: Spouse Available Help at Discharge: Family;Available 24 hours/day Vocation: Retired   IT consultant  Overall Cognitive Status: History of cognitive impairments - at  baseline Arousal/Alertness: Awake/alert Orientation Level: Oriented to person;Oriented to place;Oriented to situation    Comprehension  Auditory Comprehension Overall Auditory Comprehension: Impaired at baseline Commands: Impaired Multistep Basic Commands: 75-100% accurate    Expression Expression Primary Mode of Expression: Verbal Verbal Expression Overall Verbal Expression: Impaired Initiation: No impairment Automatic Speech: Name;Social Response;Counting Level of Generative/Spontaneous Verbalization: Sentence;Conversation Repetition: No impairment Naming: Impairment Responsive: 26-50% accurate Confrontation: Impaired Convergent: 75-100% accurate Divergent: 25-49% accurate Verbal Errors: Aware of errors;Semantic paraphasias;Perseveration Pragmatics: No impairment Other Verbal Expression Comments: Overall, expressive aphasia Written Expression Dominant Hand: Right   Oral / Motor Oral Motor/Sensory Function Overall Oral Motor/Sensory Function: Appears within functional limits for tasks assessed Motor Speech Overall Motor Speech: Impaired at baseline Phonation: Breathy;Hoarse;Other (comment) (hx laryngeal cancer in 80s) Resonance: Within functional limits Articulation: Within functional limitis Intelligibility: Intelligible Motor Planning: Witnin functional limits   GO     Blenda Mounts Laurice 06/08/2013, 11:03 AM

## 2013-06-09 ENCOUNTER — Telehealth: Payer: Self-pay | Admitting: Internal Medicine

## 2013-06-09 ENCOUNTER — Ambulatory Visit (INDEPENDENT_AMBULATORY_CARE_PROVIDER_SITE_OTHER): Payer: Medicare Other | Admitting: Family

## 2013-06-09 ENCOUNTER — Encounter: Payer: Self-pay | Admitting: Family

## 2013-06-09 VITALS — BP 140/78 | HR 90 | Wt 186.0 lb

## 2013-06-09 DIAGNOSIS — I639 Cerebral infarction, unspecified: Secondary | ICD-10-CM

## 2013-06-09 DIAGNOSIS — F32A Depression, unspecified: Secondary | ICD-10-CM

## 2013-06-09 DIAGNOSIS — F329 Major depressive disorder, single episode, unspecified: Secondary | ICD-10-CM

## 2013-06-09 DIAGNOSIS — F3289 Other specified depressive episodes: Secondary | ICD-10-CM

## 2013-06-09 DIAGNOSIS — I4891 Unspecified atrial fibrillation: Secondary | ICD-10-CM

## 2013-06-09 DIAGNOSIS — R4701 Aphasia: Secondary | ICD-10-CM

## 2013-06-09 DIAGNOSIS — J449 Chronic obstructive pulmonary disease, unspecified: Secondary | ICD-10-CM

## 2013-06-09 DIAGNOSIS — I6350 Cerebral infarction due to unspecified occlusion or stenosis of unspecified cerebral artery: Secondary | ICD-10-CM

## 2013-06-09 DIAGNOSIS — J4489 Other specified chronic obstructive pulmonary disease: Secondary | ICD-10-CM

## 2013-06-09 MED ORDER — DULOXETINE HCL 30 MG PO CPEP: 30.0000 mg | ORAL_CAPSULE | Freq: Every day | ORAL | Status: DC

## 2013-06-09 NOTE — Telephone Encounter (Signed)
Husband has cymbalta at home. He has about a 3 wk supply.  Not sure if that is what you want to do, either way will need new rx Pharm: [facility]

## 2013-06-09 NOTE — Telephone Encounter (Signed)
Send in cymbalta 30 mg, per [employer]

## 2013-06-09 NOTE — Progress Notes (Signed)
Subjective:    Patient ID: [name], female    DOB: 22-Sep-1927, 79 y.o.   MRN: [id number]  HPI 79 year old white female, patient of Dr. Lovell Sheehan is in today as a hospital followup. She has a history of hypertension, atrial fibrillation, peripheral vascular disease, anxiety, depression, COPD. She was hospitalized on 05/31/2013 then subsequently on 06/07/2013. She was discharged from the hospital on 06/09/2013. Originally, patient presented on 05/24/2013 she presented to the emergency department with confusion. She had a CT scan that was negative. She was later discharged. She returned to the emergency department on 05/26/2013, this can't have an MRI. MRI showed a left lacunar infarct. At discharge, she was changed to Eliquis from Pradaxa after a treatment failure.  on October 29 and 2014 she went back to the emergency department with worsening aphasia. She was found to have had a second event. However, because she had not been on Eliquis long she was not considered a treatment failure. He continues to be on the medication today per neurology. She went back to the emergency department on 06/07/2013 and was subsequently discharged on 06/09/2013 after presenting with nausea, difficulty speaking, and imbalance. She had an MRI that was negative and an EEG that was also negative. She is due to followup with neurology in January however she is concerned because her discharge paperwork sent one week. She also would like to see a neurologist within [place]. Patient reports doing well. She'll dose with mood from crying spells to being normal. Husband reports her being irritable. She is declining any medication to help her mood. She is due to see speech therapy next week. She has no residual physical deficits from her events.   Review of Systems  Constitutional: Negative.   HENT: Negative.   Respiratory: Negative.   Cardiovascular: Negative.   Gastrointestinal: Negative.   Endocrine: Negative.    Musculoskeletal: Negative.   Skin: Negative.   Allergic/Immunologic: Negative.   Neurological: Negative for dizziness and speech difficulty.  Hematological: Negative.   Psychiatric/Behavioral: Negative.    Past Medical History  Diagnosis Date  . GERD (gastroesophageal reflux disease)   . History of TIAs   . Diverticulosis   . PUD (peptic ulcer disease)   . PVD (peripheral vascular disease)   . Hyperlipidemia   . Anxiety   . Depression   . Blood transfusion     "w/1st miscarriage" (05/26/2013)  . COPD (chronic obstructive pulmonary disease)   . Hypertension   . PONV (postoperative nausea and vomiting)   . Family history of anesthesia complication     "sisters also get sick as a dog" (05/26/2013)  . Coronary artery disease   . Heart murmur     "when I was a child" (05/26/2013)  . DVT (deep venous thrombosis) 1954    "after I had my first child" (05/26/2013)  . Pneumonia 2013    "once" (05/26/2013)  . Exertional shortness of breath   . H/O hiatal hernia   . Stroke 10/2002    no residual  . Stroke 05/26/2013    "very mild; affected my speech for a while" (05/26/2013)  . Arthritis     "lots; hands" (05/26/2013)  . 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 Lymphona, mantle cell, inguinal region/lower limb     "large c-cell" (05/26/2013)    History   Social History  . Marital Status: Married    Spouse Name: N/A    Number of Children: 6  .  Years of Education: N/A   Occupational History  . retired    Social History Main Topics  . Smoking status: Former Smoker -- 2.50 packs/day for 37 years    Types: Cigarettes    Quit date: 09/05/1984  . Smokeless tobacco: Never Used  . Alcohol Use: No  . Drug Use: No  . Sexual Activity: Not Currently   Other Topics Concern  . Not on file   Social History Narrative  . No narrative on file    Past Surgical History  Procedure Laterality Date  . Diverting ileostomy  09/22/2008     iliostomy for diverticulitis/notes 09/22/2008 (05/26/2013)  . Tubal ligation  1972  . Radical neck dissection  1986    "larynx cancer" (05/26/2013)  . Total knee arthroplasty Bilateral     2002 left 2008 right  . Ileostomy closure  01/2010  . Cataract extraction  2005    bilateral  . Carotid endarterectomy Bilateral 2004    2004 a month apart right and left  . Tonsillectomy and adenoidectomy  1936    "tonsils grew back; no 2nd OR" (05/26/2013)  . Hernia repair  05/2010    ventral hernia repair  . Carpal tunnel release Left ?1960's  . Abdominal exploration surgery  12/01/2012  . Appendectomy  09/13/2008  . Colon surgery  09/13/2008    Laparoscopic assisted converted to open sigmoid colectomy.Hattie Perch 09/13/2008 (05/26/2013)  . Cataract extraction w/ intraocular lens  implant, bilateral Bilateral ~ 2003  . Dilation and curettage of uterus  1950's    "had 2 for miscarriages" (05/26/2013)    Family History  Problem Relation Age of Onset  . Colon cancer Sister     colon  . Hyperlipidemia Mother   . Stroke Mother   . Cancer Father     mesothelioma    Allergies  Allergen Reactions  . Propoxyphene Hcl     Rxn: unknown    Current Outpatient Prescriptions on File Prior to Visit  Medication Sig Dispense Refill  . ALPRAZolam (XANAX) 0.5 MG tablet Take 1 tablet (0.5 mg total) by mouth at bedtime as needed for sleep.  30 tablet  3  . apixaban (ELIQUIS) 2.5 MG TABS tablet Take 1 tablet (2.5 mg total) by mouth 2 (two) times daily.  60 tablet  0  . bisoprolol (ZEBETA) 5 MG tablet Take 5 mg by mouth daily at 12 noon.       . Cyanocobalamin (VITAMIN B12 PO) Take 5,000 mcg by mouth daily.      . diclofenac sodium (VOLTAREN) 1 % GEL Apply 2 g topically 4 (four) times daily as needed (pain; apply to hand).       . levETIRAcetam (KEPPRA) 250 MG tablet Take 1 tablet (250 mg total) by mouth 2 (two) times daily.  60 tablet  5  . loperamide (IMODIUM) 2 MG capsule Take 2 mg by mouth daily as needed  for diarrhea or loose stools.       . midodrine (PROAMATINE) 5 MG tablet Take 1 tablet (5 mg total) by mouth 3 (three) times daily with meals.  90 tablet  0  . Multiple Vitamin (MULTIVITAMIN WITH MINERALS) TABS tablet Take 1 tablet by mouth daily.      . Vitamin D, Ergocalciferol, (DRISDOL) 50000 UNITS CAPS Take 50,000 Units by mouth every Sunday.       No current facility-administered medications on file prior to visit.    BP 140/78  Pulse 90  Wt 186 lb (84.369 kg)chart  Objective:   Physical Exam  Constitutional: She is oriented to person, place, and time. She appears well-developed and well-nourished.  HENT:  Right Ear: External ear normal.  Left Ear: External ear normal.  Nose: Nose normal.  Mouth/Throat: Oropharynx is clear and moist.  Neck: Normal range of motion. Neck supple. No thyromegaly present.  Cardiovascular: Normal rate, regular rhythm and normal heart sounds.   Pulmonary/Chest: Effort normal and breath sounds normal.  Abdominal: Soft. Bowel sounds are normal. She exhibits no distension. There is no tenderness. There is no rebound.  Musculoskeletal: Normal range of motion. She exhibits no edema and no tenderness.  Neurological: She is alert and oriented to person, place, and time. She has normal reflexes. She displays normal reflexes. No cranial nerve deficit. Coordination normal.  Skin: Skin is warm and dry.  Psychiatric: She has a normal mood and affect.          Assessment & Plan:  Assessment: 1. Atrial fibrillation 2 CVA 3 hyperlipidemia 4. Hypertension 5. Depression 6 COPD  Plan: Referred to neurology. Continue current medications. Followup here in one month. Speech therapy as scheduled. Call with any questions or concerns.Marland Kitchen

## 2013-06-09 NOTE — Patient Instructions (Signed)

## 2013-06-12 NOTE — Progress Notes (Addendum)
Late entry for missed G-code. Based on review of evaluation by Kingsley Callander, PT.  06/14/2013 1146  PT G-Codes **NOT FOR INPATIENT CLASS**  Functional Assessment Tool Used Clinical judgement based on chart review   Functional Limitation Mobility: Walking and moving around  Mobility: Walking and Moving Around Current Status (Z6109) CI  Mobility: Walking and Moving Around Goal Status (U0454) CI  Mobility: Walking and Moving Around Discharge Status [phone]) CI  .[place], [place]  [phone] 06/12/2013

## 2013-06-14 ENCOUNTER — Ambulatory Visit (INDEPENDENT_AMBULATORY_CARE_PROVIDER_SITE_OTHER): Payer: Medicare Other | Admitting: Family

## 2013-06-14 ENCOUNTER — Encounter: Payer: Self-pay | Admitting: Family

## 2013-06-14 VITALS — BP 124/62 | HR 71 | Wt 189.0 lb

## 2013-06-14 DIAGNOSIS — J309 Allergic rhinitis, unspecified: Secondary | ICD-10-CM

## 2013-06-14 DIAGNOSIS — H1012 Acute atopic conjunctivitis, left eye: Secondary | ICD-10-CM

## 2013-06-14 DIAGNOSIS — H1045 Other chronic allergic conjunctivitis: Secondary | ICD-10-CM

## 2013-06-14 MED ORDER — NEOMYCIN-POLYMYXIN-DEXAMETH 3.5-10000-0.1 OP SUSP: 1.0000 [drp] | Freq: Four times a day (QID) | OPHTHALMIC | Status: DC

## 2013-06-14 NOTE — Patient Instructions (Signed)

## 2013-06-14 NOTE — Progress Notes (Signed)
Subjective:    Patient ID: [name], female    DOB: [birth year], 79 y.o.   MRN: [id number]  HPI 79 year old white female, nonsmoker, patient of Dr. Albina Billet today with complaint of left eye red and itching x1 day. Husband reports that she has been rubbing it a lot. Denies any blurred vision, double vision, pain. No sneezing, coughing or congested.   Review of Systems  Constitutional: Negative.   HENT: Negative.   Eyes: Positive for redness and itching. Negative for photophobia, pain and visual disturbance.  Respiratory: Negative.   Cardiovascular: Negative.   Musculoskeletal: Negative.   Skin: Negative.   Neurological: Negative.   Psychiatric/Behavioral: Negative.    Past Medical History  Diagnosis Date  . GERD (gastroesophageal reflux disease)   . History of TIAs   . Diverticulosis   . PUD (peptic ulcer disease)   . PVD (peripheral vascular disease)   . Hyperlipidemia   . Anxiety   . Depression   . Blood transfusion     "w/1st miscarriage" (05/26/2013)  . COPD (chronic obstructive pulmonary disease)   . Hypertension   . PONV (postoperative nausea and vomiting)   . Family history of anesthesia complication     "sisters also get sick as a dog" (05/26/2013)  . Coronary artery disease   . Heart murmur     "when I was a child" (05/26/2013)  . DVT (deep venous thrombosis) 1954    "after I had my first child" (05/26/2013)  . Pneumonia 2013    "once" (05/26/2013)  . Exertional shortness of breath   . H/O hiatal hernia   . Stroke 10/2002    no residual  . Stroke 05/26/2013    "very mild; affected my speech for a while" (05/26/2013)  . Arthritis     "lots; hands" (05/26/2013)  . 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 Lymphona, mantle cell, inguinal region/lower limb     "large c-cell" (05/26/2013)    History   Social History  . Marital Status: Married    Spouse Name: N/A    Number of Children: 6  . Years  of Education: N/A   Occupational History  . retired    Social History Main Topics  . Smoking status: Former Smoker -- 2.50 packs/day for 37 years    Types: Cigarettes    Quit date: 09/05/1984  . Smokeless tobacco: Never Used  . Alcohol Use: No  . Drug Use: No  . Sexual Activity: Not Currently   Other Topics Concern  . Not on file   Social History Narrative  . No narrative on file    Past Surgical History  Procedure Laterality Date  . 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 Tubal ligation  1972  . Radical neck dissection  1986    "larynx cancer" (05/26/2013)  . Total knee arthroplasty Bilateral     2002 left 2008 right  . Ileostomy closure  01/2010  . Cataract extraction  2005    bilateral  . Carotid endarterectomy Bilateral 2004    2004 a month apart right and left  . Tonsillectomy and adenoidectomy  1936    "tonsils grew back; no 2nd OR" (05/26/2013)  . Hernia repair  05/2010    ventral hernia repair  . Carpal tunnel release Left ?1960's  . Abdominal exploration surgery  12/01/2012  . Appendectomy  09/13/2008  . Colon surgery  09/13/2008  Laparoscopic assisted converted to open sigmoid colectomy.Hattie Perch 09/13/2008 (05/26/2013)  . Cataract extraction w/ intraocular lens  implant, bilateral Bilateral ~ 2003  . Dilation and curettage of uterus  1950's    "had 2 for miscarriages" (05/26/2013)    Family History  Problem Relation Age of Onset  . Colon cancer Sister     colon  . Hyperlipidemia Mother   . Stroke Mother   . Cancer Father     mesothelioma    Allergies  Allergen Reactions  . Propoxyphene Hcl     Rxn: unknown    Current Outpatient Prescriptions on File Prior to Visit  Medication Sig Dispense Refill  . ALPRAZolam (XANAX) 0.5 MG tablet Take 1 tablet (0.5 mg total) by mouth at bedtime as needed for sleep.  30 tablet  3  . apixaban (ELIQUIS) 2.5 MG TABS tablet Take 1 tablet (2.5 mg total) by mouth  2 (two) times daily.  60 tablet  0  . bisoprolol (ZEBETA) 5 MG tablet Take 5 mg by mouth daily at 12 noon.       . Cyanocobalamin (VITAMIN B12 PO) Take 5,000 mcg by mouth daily.      . diclofenac sodium (VOLTAREN) 1 % GEL Apply 2 g topically 4 (four) times daily as needed (pain; apply to hand).       . DULoxetine (CYMBALTA) 30 MG capsule Take 1 capsule (30 mg total) by mouth daily.  30 capsule  0  . levETIRAcetam (KEPPRA) 250 MG tablet Take 1 tablet (250 mg total) by mouth 2 (two) times daily.  60 tablet  5  . loperamide (IMODIUM) 2 MG capsule Take 2 mg by mouth daily as needed for diarrhea or loose stools.       . midodrine (PROAMATINE) 5 MG tablet Take 1 tablet (5 mg total) by mouth 3 (three) times daily with meals.  90 tablet  0  . Multiple Vitamin (MULTIVITAMIN WITH MINERALS) TABS tablet Take 1 tablet by mouth daily.      . Vitamin D, Ergocalciferol, (DRISDOL) 50000 UNITS CAPS Take 50,000 Units by mouth every Sunday.       No current facility-administered medications on file prior to visit.    BP 124/62  Pulse 71  Wt 189 lb (85.73 kg)chart    Objective:   Physical Exam  Constitutional: She is oriented to person, place, and time. She appears well-developed and well-nourished.  Eyes:  Left conjunctiva red and watery. No matting or crusting  Neck: Normal range of motion. Neck supple.  Cardiovascular: Normal rate, regular rhythm and normal heart sounds.   Pulmonary/Chest: Effort normal and breath sounds normal.  Neurological: She is alert and oriented to person, place, and time.  Skin: Skin is warm and dry.  Psychiatric: She has a normal mood and affect.          Assessment & Plan:  Assessment: 1. Allergic conjunctivitis 2. Hypertension 3. Depression   Plan: Dexamethasone eyedrops one drop every 6 hours x5 days. Probably office if symptoms worsen or persist. Recheck as scheduled, and as needed.

## 2013-06-14 NOTE — Progress Notes (Signed)
Pre-visit discussion using our clinic review tool. No additional management support is needed unless otherwise documented below in the visit note.  

## 2013-06-16 ENCOUNTER — Encounter (HOSPITAL_COMMUNITY): Payer: Self-pay | Admitting: Radiology

## 2013-06-16 ENCOUNTER — Emergency Department (HOSPITAL_COMMUNITY): Payer: Medicare Other

## 2013-06-16 ENCOUNTER — Emergency Department (HOSPITAL_COMMUNITY)
Admission: EM | Admit: 2013-06-16 | Discharge: 2013-06-16 | Disposition: A | Discharge status: Home / Self Care | Payer: Medicare Other | Attending: Emergency Medicine | Admitting: Emergency Medicine

## 2013-06-16 DIAGNOSIS — Z8521 Personal history of malignant neoplasm of larynx: Secondary | ICD-10-CM | POA: Insufficient documentation

## 2013-06-16 DIAGNOSIS — Z8719 Personal history of other diseases of the digestive system: Secondary | ICD-10-CM | POA: Insufficient documentation

## 2013-06-16 DIAGNOSIS — J441 Chronic obstructive pulmonary disease with (acute) exacerbation: Secondary | ICD-10-CM | POA: Insufficient documentation

## 2013-06-16 DIAGNOSIS — F3289 Other specified depressive episodes: Secondary | ICD-10-CM | POA: Insufficient documentation

## 2013-06-16 DIAGNOSIS — Z8673 Personal history of transient ischemic attack (TIA), and cerebral infarction without residual deficits: Secondary | ICD-10-CM | POA: Insufficient documentation

## 2013-06-16 DIAGNOSIS — M549 Dorsalgia, unspecified: Secondary | ICD-10-CM | POA: Insufficient documentation

## 2013-06-16 DIAGNOSIS — R011 Cardiac murmur, unspecified: Secondary | ICD-10-CM | POA: Insufficient documentation

## 2013-06-16 DIAGNOSIS — F411 Generalized anxiety disorder: Secondary | ICD-10-CM | POA: Insufficient documentation

## 2013-06-16 DIAGNOSIS — I2510 Atherosclerotic heart disease of native coronary artery without angina pectoris: Secondary | ICD-10-CM | POA: Insufficient documentation

## 2013-06-16 DIAGNOSIS — F329 Major depressive disorder, single episode, unspecified: Secondary | ICD-10-CM | POA: Insufficient documentation

## 2013-06-16 DIAGNOSIS — Z8701 Personal history of pneumonia (recurrent): Secondary | ICD-10-CM | POA: Insufficient documentation

## 2013-06-16 DIAGNOSIS — G40109 Localization-related (focal) (partial) symptomatic epilepsy and epileptic syndromes with simple partial seizures, not intractable, without status epilepticus: Secondary | ICD-10-CM | POA: Insufficient documentation

## 2013-06-16 DIAGNOSIS — M19049 Primary osteoarthritis, unspecified hand: Secondary | ICD-10-CM | POA: Insufficient documentation

## 2013-06-16 DIAGNOSIS — Z79899 Other long term (current) drug therapy: Secondary | ICD-10-CM | POA: Insufficient documentation

## 2013-06-16 DIAGNOSIS — R569 Unspecified convulsions: Secondary | ICD-10-CM | POA: Diagnosis present

## 2013-06-16 DIAGNOSIS — I10 Essential (primary) hypertension: Secondary | ICD-10-CM | POA: Insufficient documentation

## 2013-06-16 DIAGNOSIS — Z862 Personal history of diseases of the blood and blood-forming organs and certain disorders involving the immune mechanism: Secondary | ICD-10-CM | POA: Insufficient documentation

## 2013-06-16 DIAGNOSIS — Z8639 Personal history of other endocrine, nutritional and metabolic disease: Secondary | ICD-10-CM | POA: Insufficient documentation

## 2013-06-16 DIAGNOSIS — Z87891 Personal history of nicotine dependence: Secondary | ICD-10-CM | POA: Insufficient documentation

## 2013-06-16 LAB — DIFFERENTIAL
Basophils Absolute: 0 10*3/uL (ref 0.0–0.1)
Eosinophils Absolute: 0.2 10*3/uL (ref 0.0–0.7)
Eosinophils Relative: 5 % (ref 0–5)
Lymphocytes Relative: 27 % (ref 12–46)
Lymphs Abs: 1.1 10*3/uL (ref 0.7–4.0)
Monocytes Relative: 7 % (ref 3–12)
Neutro Abs: 2.4 10*3/uL (ref 1.7–7.7)
Neutrophils Relative %: 60 % (ref 43–77)

## 2013-06-16 LAB — URINALYSIS, ROUTINE W REFLEX MICROSCOPIC
Bilirubin Urine: NEGATIVE
Hgb urine dipstick: NEGATIVE
Nitrite: NEGATIVE
Protein, ur: NEGATIVE mg/dL
Specific Gravity, Urine: 1.012 (ref 1.005–1.030)
Urobilinogen, UA: 0.2 mg/dL (ref 0.0–1.0)

## 2013-06-16 LAB — COMPREHENSIVE METABOLIC PANEL
ALT: 23 U/L (ref 0–35)
AST: 26 U/L (ref 0–37)
Albumin: 3.9 g/dL (ref 3.5–5.2)
Alkaline Phosphatase: 64 U/L (ref 39–117)
BUN: 13 mg/dL (ref 6–23)
CO2: 24 mEq/L (ref 19–32)
Calcium: 9.6 mg/dL (ref 8.4–10.5)
Chloride: 104 mEq/L (ref 96–112)
GFR calc Af Amer: 51 mL/min — ABNORMAL LOW (ref >90)
Glucose, Bld: 138 mg/dL — ABNORMAL HIGH (ref 70–99)
Potassium: 3.8 mEq/L (ref 3.5–5.1)
Sodium: 140 mEq/L (ref 135–145)
Total Bilirubin: 0.7 mg/dL (ref 0.3–1.2)

## 2013-06-16 LAB — CBC
Hemoglobin: 13.4 g/dL (ref 12.0–15.0)
MCH: 29.5 pg (ref 26.0–34.0)
RBC: 4.55 MIL/uL (ref 3.87–5.11)
RDW: 13.9 % (ref 11.5–15.5)
WBC: 3.9 10*3/uL — ABNORMAL LOW (ref 4.0–10.5)

## 2013-06-16 LAB — POCT I-STAT, CHEM 8
BUN: 12 mg/dL (ref 6–23)
Creatinine, Ser: 1.4 mg/dL — ABNORMAL HIGH (ref 0.50–1.10)
Hemoglobin: 13.3 g/dL (ref 12.0–15.0)
Potassium: 3.7 mEq/L (ref 3.5–5.1)
Sodium: 142 mEq/L (ref 135–145)
TCO2: 25 mmol/L (ref 0–100)

## 2013-06-16 LAB — RAPID URINE DRUG SCREEN, HOSP PERFORMED
Amphetamines: NOT DETECTED
Barbiturates: NOT DETECTED

## 2013-06-16 LAB — URINE MICROSCOPIC-ADD ON

## 2013-06-16 LAB — ETHANOL: Alcohol, Ethyl (B): <11 mg/dL (ref 0–11)

## 2013-06-16 LAB — TROPONIN I: Troponin I: <0.3 ng/mL (ref <0.30)

## 2013-06-16 LAB — POCT I-STAT TROPONIN I: Troponin i, poc: 0.01 ng/mL (ref 0.00–0.08)

## 2013-06-16 LAB — PROTIME-INR
INR: 1.14 (ref 0.00–1.49)
Prothrombin Time: 14.4 seconds (ref 11.6–15.2)

## 2013-06-16 LAB — GLUCOSE, CAPILLARY: Glucose-Capillary: 115 mg/dL — ABNORMAL HIGH (ref 70–99)

## 2013-06-16 MED ORDER — LEVETIRACETAM 250 MG PO TABS: 500.0000 mg | ORAL_TABLET | Freq: Two times a day (BID) | ORAL | Status: DC

## 2013-06-16 MED ORDER — SODIUM CHLORIDE 0.9 % IV SOLN
500.0000 mg | Freq: Once | INTRAVENOUS | Status: AC
  Administered 2013-06-16: 500 mg via INTRAVENOUS
  Filled 2013-06-16: qty 5

## 2013-06-16 NOTE — ED Notes (Signed)
Pt.s husband reports that she has period of slurred speech and difficulty finding words to complete her sentences.  Husband reports these symptoms developed during her first stroke 2  Weeks ago.

## 2013-06-16 NOTE — Consult Note (Signed)
Referring Physician: Dr. Rubin Payor    Chief Complaint: Recurrent left-sided weakness.  HPI: [name] is an 79 y.o. female history of atrial fibrillation, hypertension and hyperlipidemia as well as previous stroke, presenting with recurrent left-sided weakness. Patient was admitted 3 weeks ago for similar symptoms felt to possibly be secondary to seizure activity. Patient was started on Keppra 250 mg twice a day. She's currently on anticoagulation with apixiban of atrial fibrillation. CT scan of her head showed no acute intracranial abnormality. Deficits resolved in route to the emergency room. NIH stroke score was 0.  LSN: 7:45 AM on 06/16/2013 tPA Given: No: Deficits rapidly resolved MRankin: 0  Past Medical History  Diagnosis Date  . GERD (gastroesophageal reflux disease)   . History of TIAs   . Diverticulosis   . PUD (peptic ulcer disease)   . PVD (peripheral vascular disease)   . Hyperlipidemia   . Anxiety   . Depression   . Blood transfusion     "w/1st miscarriage" (05/26/2013)  . COPD (chronic obstructive pulmonary disease)   . Hypertension   . PONV (postoperative nausea and vomiting)   . Family history of anesthesia complication     "sisters also get sick as a dog" (05/26/2013)  . Coronary artery disease   . Heart murmur     "when I was a child" (05/26/2013)  . DVT (deep venous thrombosis) 1954    "after I had my first child" (05/26/2013)  . Pneumonia 2013    "once" (05/26/2013)  . Exertional shortness of breath   . H/O hiatal hernia   . Stroke 10/2002    no residual  . Stroke 05/26/2013    "very mild; affected my speech for a while" (05/26/2013)  . Arthritis     "lots; hands" (05/26/2013)  . 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 Lymphona, mantle cell, inguinal region/lower limb     "large c-cell" (05/26/2013)    Family History  Problem Relation Age of Onset  . Colon cancer Sister     colon  .  Hyperlipidemia Mother   . Stroke Mother   . Cancer Father     mesothelioma     Medications: I have reviewed the patient's current medications.  ROS: History obtained from spouse and the patient  General ROS: negative for - chills, fatigue, fever, night sweats, weight gain or weight loss Psychological ROS: negative for - behavioral disorder, hallucinations, memory difficulties, mood swings or suicidal ideation Ophthalmic ROS: negative for - blurry vision, double vision, eye pain or loss of vision ENT ROS: negative for - epistaxis, nasal discharge, oral lesions, sore throat, tinnitus or vertigo Allergy and Immunology ROS: negative for - hives or itchy/watery eyes Hematological and Lymphatic ROS: negative for - bleeding problems, bruising or swollen lymph nodes Endocrine ROS: negative for - galactorrhea, hair pattern changes, polydipsia/polyuria or temperature intolerance Respiratory ROS: negative for - cough, hemoptysis, shortness of breath or wheezing Cardiovascular ROS: negative for - chest pain, dyspnea on exertion, edema or irregular heartbeat Gastrointestinal ROS: negative for - abdominal pain, diarrhea, hematemesis, nausea/vomiting or stool incontinence Genito-Urinary ROS: negative for - dysuria, hematuria, incontinence or urinary frequency/urgency Musculoskeletal ROS: negative for - joint swelling or muscular weakness Neurological ROS: as noted in HPI Dermatological ROS: negative for rash and skin lesion changes  Physical Examination: Blood pressure 136/61, pulse 61, temperature 97.6 F (36.4 C), temperature source Oral, resp. rate 18, SpO2 100.00%.  Neurologic Examination: Mental Status: Alert,  oriented, thought content appropriate.  Speech fluent without evidence of aphasia. Able to follow commands without difficulty. Cranial Nerves: II-Visual fields were normal. III/IV/VI-Pupils were equal and reacted. Extraocular movements were full and conjugate.    V/VII-no facial  numbness and no facial weakness. VIII-normal. X-normal speech and symmetrical palatal movement. Motor: 5/5 bilaterally with normal tone and bulk Sensory: Normal throughout. Deep Tendon Reflexes: Trace to 1+ and symmetric. Plantars: Flexor bilaterally Cerebellar: Normal finger-to-nose testing.  Ct Head Wo Contrast  06/16/2013   CLINICAL DATA:  Code stroke, left-sided weakness  EXAM: CT HEAD WITHOUT CONTRAST  TECHNIQUE: Contiguous axial images were obtained from the base of the skull through the vertex without intravenous contrast.  COMPARISON:  06/07/2013  FINDINGS: No skull fracture is noted. Atherosclerotic calcifications of carotid siphon again noted.  No intracranial hemorrhage, mass effect or midline shift. No definite acute cortical infarction. Stable cerebral atrophy. Again noted periventricular and subcortical white matter decreased attenuation consistent with chronic small vessel ischemic changes. Stable small lacunar infarct left lentiform nucleus and coronal radiata.  IMPRESSION: No intracranial hemorrhage, mass effect or midline shift. No definite acute cortical infarction. Stable cerebral atrophy. Again noted periventricular and subcortical white matter decreased attenuation consistent with chronic small vessel ischemic changes. Stable small lacunar infarct left lentiform nucleus and coronal radiata.   Electronically Signed   By: Natasha Mead M.D.   On: 06/16/2013 09:10    Assessment: 79 y.o. female with significant risk factors for stroke presenting with transient left-sided weakness. Recurrent TIA cannot be ruled out. As well, recurrent focal seizure activity cannot be ruled out. Patient's deficits have resolved. She is on anticoagulation with apixiban 2.5 mg twice a day. She underwent stroke workup 3 weeks ago which was unremarkable. Her dose of Keppra is somewhat low.  Stroke Risk Factors - atrial fibrillation, hyperlipidemia and hypertension   Plan: 1. Keppra 500 mg IV, followed by  increase in maintenance dose to 500 mg twice a day. 2. Continue apixiban 2.5 mg twice a day. 3. No further neurodiagnostic studies are indicated. 4. Routine outpatient neurology followup, as scheduled.  C.R. Roseanne Reno, MD [facility]  06/16/2013, 9:21 AM

## 2013-06-16 NOTE — ED Notes (Signed)
Care transferred, report received Lori, RN. 

## 2013-06-16 NOTE — Code Documentation (Signed)
Patient arrived to [facility] at 21 via [facility].  Patient woke up at her baseline this morning and developed left sided weakness at 0745.  EMS called and assessed left sided weakness that resolved en route.  Code Stroke called at 0833, LKW 0745, EDP exam/cleared for CT at [phone], stroke team arrival at 0840, neurologist arrival at [phone], patient arrival in CT at [phone], phlebotomist arrival at [phone].  NIHSS 0 on arrival.  Patient had TIA two weeks ago that involved difficulty speaking.  Husband reports that she has had difficulty getting her words out since that time.  Patient taking Eliquis.  Code stroke canceled by Dr. Roseanne Reno at [phone].  Bedside handoff with ED RN Lawson Fiscal.

## 2013-06-16 NOTE — ED Notes (Signed)
Spoke with IV nurse, she will  Be down shortly.

## 2013-06-16 NOTE — ED Notes (Signed)
IV nurse at the bedside

## 2013-06-16 NOTE — ED Notes (Addendum)
Pt. Woke up this am and husband stated that she went to do something and she never did it. Time was 7:45 When paramedics arrived she was having decreased movement in her lt. Arm and leg  .  Skin was ashen.  Paramedics reports that once in the truck the symptoms resolved.  Pt. Arrived at the ED bridge. Pt. Symptoms are resolved. She is alert and oriented X3.  Follows commands.  Speech is clear airway patent.  Pt. Transferred to CT 1. Stroke team and Dr. Roseanne Reno and Dr. Rubin Payor present.

## 2013-06-16 NOTE — ED Provider Notes (Signed)
CSN: [id number]     Arrival date & time 06/16/13  7829 History   First MD Initiated Contact with Patient 06/16/13 [phone]     Chief Complaint  Patient presents with  . Code Stroke   (Consider location/radiation/quality/duration/timing/severity/associated sxs/prior Treatment) The history is provided by the patient.   patient came in as a code stroke. Reportedly woke up normal this morning but then at around 8:30 developed difficulty talking and right-sided weakness. She's a history of recent stroke with complete workup. That was followed by neuro deficits that were thought to be related to possible seizure. She's on Keppra. Upon arrival to the ED her symptoms had improved significantly and rales resolved. No headache. No chest pain. Abdominal pain. She started on anticoagulation.  Past Medical History  Diagnosis Date  . GERD (gastroesophageal reflux disease)   . History of TIAs   . Diverticulosis   . PUD (peptic ulcer disease)   . PVD (peripheral vascular disease)   . Hyperlipidemia   . Anxiety   . Depression   . Blood transfusion     "w/1st miscarriage" (05/26/2013)  . COPD (chronic obstructive pulmonary disease)   . Hypertension   . PONV (postoperative nausea and vomiting)   . Family history of anesthesia complication     "sisters also get sick as a dog" (05/26/2013)  . Coronary artery disease   . Heart murmur     "when I was a child" (05/26/2013)  . DVT (deep venous thrombosis) 1954    "after I had my first child" (05/26/2013)  . Pneumonia 2013    "once" (05/26/2013)  . Exertional shortness of breath   . H/O hiatal hernia   . Stroke 10/2002    no residual  . Stroke 05/26/2013    "very mild; affected my speech for a while" (05/26/2013)  . Arthritis     "lots; hands" (05/26/2013)  . 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 Lymphona, mantle cell, inguinal region/lower limb     "large c-cell" (05/26/2013)   Past Surgical History   Procedure Laterality Date  . 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 Tubal ligation  1972  . Radical neck dissection  1986    "larynx cancer" (05/26/2013)  . Total knee arthroplasty Bilateral     2002 left 2008 right  . Ileostomy closure  01/2010  . Cataract extraction  2005    bilateral  . Carotid endarterectomy Bilateral 2004    2004 a month apart right and left  . Tonsillectomy and adenoidectomy  1936    "tonsils grew back; no 2nd OR" (05/26/2013)  . Hernia repair  05/2010    ventral hernia repair  . Carpal tunnel release Left ?1960's  . Abdominal exploration surgery  12/01/2012  . Appendectomy  09/13/2008  . Colon surgery  09/13/2008    Laparoscopic assisted converted to open sigmoid colectomy.Hattie Perch 09/13/2008 (05/26/2013)  . Cataract extraction w/ intraocular lens  implant, bilateral Bilateral ~ 2003  . Dilation and curettage of uterus  1950's    "had 2 for miscarriages" (05/26/2013)   Family History  Problem Relation Age of Onset  . Colon cancer Sister     colon  . Hyperlipidemia Mother   . Stroke Mother   . Cancer Father     mesothelioma   History  Substance Use Topics  . Smoking status: Former Smoker -- 2.50 packs/day for 37 years    Types:  Cigarettes    Quit date: 09/05/1984  . Smokeless tobacco: Never Used  . Alcohol Use: No   OB History   Grav Para Term Preterm Abortions TAB SAB Ect Mult Living                 Review of Systems  Constitutional: Negative for activity change and appetite change.  Eyes: Negative for pain.  Respiratory: Negative for chest tightness and shortness of breath.   Cardiovascular: Negative for chest pain and leg swelling.  Gastrointestinal: Negative for nausea, vomiting, abdominal pain and diarrhea.  Genitourinary: Negative for flank pain.  Musculoskeletal: Positive for back pain. Negative for neck stiffness.  Skin: Negative for rash.  Neurological: Positive for speech  difficulty and weakness. Negative for numbness and headaches.  Psychiatric/Behavioral: Negative for behavioral problems.    Allergies  Propoxyphene hcl  Home Medications   Current Outpatient Rx  Name  Route  Sig  Dispense  Refill  . ALPRAZolam (XANAX) 0.5 MG tablet   Oral   Take 1 tablet (0.5 mg total) by mouth at bedtime as needed for sleep.   30 tablet   3   . apixaban (ELIQUIS) 2.5 MG TABS tablet   Oral   Take 1 tablet (2.5 mg total) by mouth 2 (two) times daily.   60 tablet   0   . bisoprolol (ZEBETA) 5 MG tablet   Oral   Take 5 mg by mouth daily at 12 noon.          . Cyanocobalamin (VITAMIN B12 PO)   Oral   Take 5,000 mcg by mouth daily.         . diclofenac sodium (VOLTAREN) 1 % GEL   Topical   Apply 2 g topically 4 (four) times daily as needed (pain; apply to hand).          . DULoxetine (CYMBALTA) 30 MG capsule   Oral   Take 1 capsule (30 mg total) by mouth daily.   30 capsule   0   . loperamide (IMODIUM) 2 MG capsule   Oral   Take 2 mg by mouth daily as needed for diarrhea or loose stools.          . midodrine (PROAMATINE) 5 MG tablet   Oral   Take 1 tablet (5 mg total) by mouth 3 (three) times daily with meals.   90 tablet   0   . neomycin-polymyxin b-dexamethasone (MAXITROL) 3.5-10000-0.1 SUSP   Left Eye   Place 1 drop into the left eye every 6 (six) hours.   1 Bottle   0   . Vitamin D, Ergocalciferol, (DRISDOL) 50000 UNITS CAPS   Oral   Take 50,000 Units by mouth every Sunday.         . levETIRAcetam (KEPPRA) 250 MG tablet   Oral   Take 2 tablets (500 mg total) by mouth 2 (two) times daily.   60 tablet   0    BP 161/69  Pulse 71  Temp(Src) 98.5 F (36.9 C) (Oral)  Resp 19  SpO2 91% Physical Exam  Nursing note and vitals reviewed. Constitutional: She is oriented to person, place, and time. She appears well-developed and well-nourished.  HENT:  Head: Normocephalic and atraumatic.  Eyes: EOM are normal. Pupils are  equal, round, and reactive to light.  Neck: Normal range of motion. Neck supple.  Cardiovascular: Normal rate, regular rhythm and normal heart sounds.   No murmur heard. Pulmonary/Chest: Effort normal and breath sounds normal. No  respiratory distress. She has no wheezes. She has no rales.  Abdominal: Soft. Bowel sounds are normal. She exhibits no distension. There is no tenderness. There is no rebound and no guarding.  Musculoskeletal: Normal range of motion.  Neurological: She is alert and oriented to person, place, and time. No cranial nerve deficit.  Complete NIH score done by neurology  Skin: Skin is warm and dry.  Psychiatric: She has a normal mood and affect. Her speech is normal.    ED Course  Procedures (including critical care time) Labs Review Labs Reviewed  CBC - Abnormal; Notable for the following:    WBC 3.9 (*)    All other components within normal limits  COMPREHENSIVE METABOLIC PANEL - Abnormal; Notable for the following:    Glucose, Bld 138 (*)    Creatinine, Ser 1.13 (*)    GFR calc non Af Amer 44 (*)    GFR calc Af Amer 51 (*)    All other components within normal limits  GLUCOSE, CAPILLARY - Abnormal; Notable for the following:    Glucose-Capillary 115 (*)    All other components within normal limits  URINALYSIS, ROUTINE W REFLEX MICROSCOPIC - Abnormal; Notable for the following:    Leukocytes, UA TRACE (*)    All other components within normal limits  URINE MICROSCOPIC-ADD ON - Abnormal; Notable for the following:    Casts HYALINE CASTS (*)    All other components within normal limits  POCT I-STAT, CHEM 8 - Abnormal; Notable for the following:    Creatinine, Ser 1.40 (*)    Glucose, Bld 140 (*)    All other components within normal limits  ETHANOL  PROTIME-INR  APTT  DIFFERENTIAL  TROPONIN I  URINE RAPID DRUG SCREEN (HOSP PERFORMED)  POCT I-STAT TROPONIN I   Imaging Review No results found.  EKG Interpretation    Date/Time:    Ventricular  Rate:  59 PR Interval:  182 QRS Duration: 88 QT Interval:  456 QTC Calculation: 452 R Axis:   80 Text Interpretation:  Sinus rhythm Anteroseptal infarct, old            MDM   1. Focal seizure   2. Unspecified essential hypertension    Patient with focal neuro deficits. History of same and was thought to be seizure after previous stroke. Symptoms of this resolved. Seen as a code stroke by neurology. Head CT reassuring. Will increase Keppra and discharge.    Juliet Rude. Rubin Payor, MD 06/19/13 1610

## 2013-06-19 ENCOUNTER — Encounter: Payer: Self-pay | Admitting: Internal Medicine

## 2013-06-20 ENCOUNTER — Encounter: Payer: Self-pay | Admitting: Family

## 2013-06-20 ENCOUNTER — Ambulatory Visit (INDEPENDENT_AMBULATORY_CARE_PROVIDER_SITE_OTHER): Payer: Medicare Other | Admitting: Family

## 2013-06-20 ENCOUNTER — Ambulatory Visit: Payer: Medicare Other | Admitting: Physical Therapy

## 2013-06-20 ENCOUNTER — Ambulatory Visit: Payer: Medicare Other | Attending: Internal Medicine | Admitting: Speech Pathology

## 2013-06-20 VITALS — BP 140/82 | HR 83 | Wt 187.0 lb

## 2013-06-20 DIAGNOSIS — G40909 Epilepsy, unspecified, not intractable, without status epilepticus: Secondary | ICD-10-CM

## 2013-06-20 DIAGNOSIS — F411 Generalized anxiety disorder: Secondary | ICD-10-CM

## 2013-06-20 DIAGNOSIS — R4701 Aphasia: Secondary | ICD-10-CM | POA: Insufficient documentation

## 2013-06-20 DIAGNOSIS — R41841 Cognitive communication deficit: Secondary | ICD-10-CM | POA: Insufficient documentation

## 2013-06-20 DIAGNOSIS — I4891 Unspecified atrial fibrillation: Secondary | ICD-10-CM

## 2013-06-20 DIAGNOSIS — R269 Unspecified abnormalities of gait and mobility: Secondary | ICD-10-CM | POA: Insufficient documentation

## 2013-06-20 DIAGNOSIS — F329 Major depressive disorder, single episode, unspecified: Secondary | ICD-10-CM

## 2013-06-20 DIAGNOSIS — M6281 Muscle weakness (generalized): Secondary | ICD-10-CM | POA: Insufficient documentation

## 2013-06-20 DIAGNOSIS — Z5189 Encounter for other specified aftercare: Secondary | ICD-10-CM | POA: Insufficient documentation

## 2013-06-20 DIAGNOSIS — F32A Depression, unspecified: Secondary | ICD-10-CM

## 2013-06-20 NOTE — Progress Notes (Signed)
Pre visit review using our clinic review tool, if applicable. No additional management support is needed unless otherwise documented below in the visit note. 

## 2013-06-20 NOTE — Progress Notes (Signed)
Subjective:    Patient ID: [name], female    DOB: 1928/05/29, 79 y.o.   MRN: [id number]  HPI 79 year old white female, patient of Dr. Lovell Sheehan is in today as a hospital followup. She was seen in the emergency department on 06/16/2013 after developing difficulty speaking and weakness to her left side. She was found to have had a focal seizure and is currently on Keppra. She was administered Keppra IV. She is tolerating the medication well. Has not had any similar events. She has a followup with neurology on 07/10/2013. Patient continues to do well on Eliquis for atrial fibrillation. She had a thrombotic event last month due to a treatment failure on Pradaxa. Family reports that she has been weeping and depressed. He had a hard time getting her to take the Cymbalta 30 mg once daily. Patient feels like she did not need the medication but does admit to being depressed and crying. She is undergoing physical therapy and speech therapy.   Review of Systems  Constitutional: Negative.   HENT: Negative.   Respiratory: Negative.   Cardiovascular: Negative.   Gastrointestinal: Negative.   Endocrine: Negative.   Genitourinary: Negative.   Musculoskeletal: Negative.   Skin: Negative.   Neurological: Positive for seizures. Negative for dizziness, tremors, syncope and weakness.       Speech improving  Hematological: Negative.   Psychiatric/Behavioral: Positive for agitation. Negative for suicidal ideas and self-injury. The patient is nervous/anxious.    Past Medical History  Diagnosis Date  . GERD (gastroesophageal reflux disease)   . History of TIAs   . Diverticulosis   . PUD (peptic ulcer disease)   . PVD (peripheral vascular disease)   . Hyperlipidemia   . Anxiety   . Depression   . Blood transfusion     "w/1st miscarriage" (05/26/2013)  . COPD (chronic obstructive pulmonary disease)   . Hypertension   . PONV (postoperative nausea and vomiting)   . Family history of anesthesia  complication     "sisters also get sick as a dog" (05/26/2013)  . Coronary artery disease   . Heart murmur     "when I was a child" (05/26/2013)  . DVT (deep venous thrombosis) 1954    "after I had my first child" (05/26/2013)  . Pneumonia 2013    "once" (05/26/2013)  . Exertional shortness of breath   . H/O hiatal hernia   . Stroke 10/2002    no residual  . Stroke 05/26/2013    "very mild; affected my speech for a while" (05/26/2013)  . Arthritis     "lots; hands" (05/26/2013)  . 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 Lymphona, mantle cell, inguinal region/lower limb     "large c-cell" (05/26/2013)    History   Social History  . Marital Status: Married    Spouse Name: N/A    Number of Children: 6  . Years of Education: N/A   Occupational History  . retired    Social History Main Topics  . Smoking status: Former Smoker -- 2.50 packs/day for 37 years    Types: Cigarettes    Quit date: 09/05/1984  . Smokeless tobacco: Never Used  . Alcohol Use: No  . Drug Use: No  . Sexual Activity: Not Currently   Other Topics Concern  . Not on file   Social History Narrative  . No narrative on file    Past Surgical History  Procedure Laterality Date  .  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 Tubal ligation  1972  . Radical neck dissection  1986    "larynx cancer" (05/26/2013)  . Total knee arthroplasty Bilateral     2002 left 2008 right  . Ileostomy closure  01/2010  . Cataract extraction  2005    bilateral  . Carotid endarterectomy Bilateral 2004    2004 a month apart right and left  . Tonsillectomy and adenoidectomy  1936    "tonsils grew back; no 2nd OR" (05/26/2013)  . Hernia repair  05/2010    ventral hernia repair  . Carpal tunnel release Left ?1960's  . Abdominal exploration surgery  12/01/2012  . Appendectomy  09/13/2008  . Colon surgery  09/13/2008     Laparoscopic assisted converted to open sigmoid colectomy.Hattie Perch 09/13/2008 (05/26/2013)  . Cataract extraction w/ intraocular lens  implant, bilateral Bilateral ~ 2003  . Dilation and curettage of uterus  1950's    "had 2 for miscarriages" (05/26/2013)    Family History  Problem Relation Age of Onset  . Colon cancer Sister     colon  . Hyperlipidemia Mother   . Stroke Mother   . Cancer Father     mesothelioma    Allergies  Allergen Reactions  . Propoxyphene Hcl     Rxn: unknown    Current Outpatient Prescriptions on File Prior to Visit  Medication Sig Dispense Refill  . ALPRAZolam (XANAX) 0.5 MG tablet Take 1 tablet (0.5 mg total) by mouth at bedtime as needed for sleep.  30 tablet  3  . apixaban (ELIQUIS) 2.5 MG TABS tablet Take 1 tablet (2.5 mg total) by mouth 2 (two) times daily.  60 tablet  0  . bisoprolol (ZEBETA) 5 MG tablet Take 5 mg by mouth daily at 12 noon.       . Cyanocobalamin (VITAMIN B12 PO) Take 5,000 mcg by mouth daily.      . diclofenac sodium (VOLTAREN) 1 % GEL Apply 2 g topically 4 (four) times daily as needed (pain; apply to hand).       . DULoxetine (CYMBALTA) 30 MG capsule Take 1 capsule (30 mg total) by mouth daily.  30 capsule  0  . levETIRAcetam (KEPPRA) 250 MG tablet Take 2 tablets (500 mg total) by mouth 2 (two) times daily.  60 tablet  0  . loperamide (IMODIUM) 2 MG capsule Take 2 mg by mouth daily as needed for diarrhea or loose stools.       . midodrine (PROAMATINE) 5 MG tablet Take 1 tablet (5 mg total) by mouth 3 (three) times daily with meals.  90 tablet  0  . neomycin-polymyxin b-dexamethasone (MAXITROL) 3.5-10000-0.1 SUSP Place 1 drop into the left eye every 6 (six) hours.  1 Bottle  0  . Vitamin D, Ergocalciferol, (DRISDOL) 50000 UNITS CAPS Take 50,000 Units by mouth every Sunday.       No current facility-administered medications on file prior to visit.    BP 140/82  Pulse 83  Wt 187 lb (84.823 kg)chart    Objective:   Physical Exam   Constitutional: She is oriented to person, place, and time. She appears well-developed and well-nourished.  HENT:  Right Ear: External ear normal.  Left Ear: External ear normal.  Nose: Nose normal.  Mouth/Throat: Oropharynx is clear and moist.  Neck: Normal range of motion. Neck supple.  Cardiovascular: Normal rate, regular rhythm and normal heart sounds.   Pulmonary/Chest: Effort normal and breath  sounds normal.  Abdominal: Soft. Bowel sounds are normal.  Musculoskeletal: Normal range of motion.  Neurological: She is alert and oriented to person, place, and time. She has normal reflexes. She displays normal reflexes. No cranial nerve deficit. Coordination normal.  Skin: Skin is warm and dry.  Psychiatric: She has a normal mood and affect.          Assessment & Plan:  Assessment: 1. Seizure disorder 2. Atrial fibrillation 3. Depression 4. Anxiety  Plan: Followup with neurology as scheduled. Follow up here in 4 weeks. Keep appointment with Dr. Lovell Sheehan in February. Call the office with any questions or concerns. Continue speech and physical therapy.

## 2013-06-20 NOTE — Patient Instructions (Signed)
Seizure, Adult A seizure is abnormal electrical activity in the brain. Seizures can cause a change in attention or behavior (altered mental status). Seizures often involve uncontrollable shaking (convulsions). Seizures usually last from 30 seconds to 2 minutes. Epilepsy is a brain disorder in which a patient has repeated seizures over time. CAUSES  There are many different problems that can cause seizures. In some cases, no cause is found. Common causes of seizures include:  Head injuries.  Brain tumors.  Infections.  Imbalance of chemicals in the blood.  Kidney failure or liver failure.  Heart disease.  Drug abuse.  Stroke.  Withdrawal from certain drugs or alcohol.  Birth defects.  Malfunction of a neurosurgical device placed in the brain. SYMPTOMS  Symptoms vary depending on the part of the brain that is involved. Right before a seizure, you may have a warning (aura) that a seizure is about to occur. An aura may include the following symptoms:   Fear or anxiety.  Nausea.  Feeling like the room is spinning (vertigo).  Vision changes, such as seeing flashing lights or spots. Common symptoms during a seizure include:  Convulsions.  Drooling.  Rapid eye movements.  Grunting.  Loss of bladder and bowel control.  Bitter taste in the mouth. After a seizure, you may feel confused and sleepy. You may also have an injury resulting from convulsions during the seizure. DIAGNOSIS  Your caregiver will perform a physical exam and run some tests to determine the type and cause of your seizure. These tests may include:  Blood tests.  A lumbar puncture test. In this test, a small amount of fluid is removed from the spine and examined.  Electrocardiography (ECG). This test records the electrical activity in your heart.  Imaging tests, such as computed tomography (CT) scans or magnetic resonance imaging (MRI).  Electroencephalography (EEG). This test records the electrical  activity in your brain. TREATMENT  Seizures usually stop on their own. Treatment will depend on the cause of your seizure. In some cases, medicine may be given to prevent future seizures. HOME CARE INSTRUCTIONS   If you are given medicines, take them exactly as prescribed by your caregiver.  Keep all follow-up appointments as directed by your caregiver.  Do not swim or drive until your caregiver says it is okay.  Teach friends and family what to do if you have a seizure. They should:  Lay you on the ground to prevent a fall.  Put a cushion under your head.  Loosen any tight clothing around your neck.  Turn you on your side. If vomiting occurs, this helps keep your airway clear.  Stay with you until you recover. SEEK IMMEDIATE MEDICAL CARE IF:  The seizure lasts longer than 2 to 5 minutes.  The seizure is severe or the person does not wake up after the seizure.  The person has altered mental status. Drive the person to the emergency department or call your local emergency services (911 in [place] MAKE SURE YOU:  Understand these instructions.  Will watch your condition.  Will get help right away if you are not doing well or get worse. Document Released: 07/17/2000 Document Revised: 10/12/2011 Document Reviewed: 07/08/2011 [employer] Patient Information 2014 [employer].  

## 2013-06-21 ENCOUNTER — Encounter (HOSPITAL_COMMUNITY): Payer: Self-pay | Admitting: Emergency Medicine

## 2013-06-21 ENCOUNTER — Other Ambulatory Visit (HOSPITAL_COMMUNITY): Payer: Medicare Other

## 2013-06-21 ENCOUNTER — Observation Stay (HOSPITAL_COMMUNITY): Payer: Medicare Other

## 2013-06-21 ENCOUNTER — Inpatient Hospital Stay (HOSPITAL_COMMUNITY)
Admission: EM | Admit: 2013-06-21 | Discharge: 2013-06-23 | DRG: 065 | Disposition: A | Discharge status: Home Health | Payer: Medicare Other | Attending: Internal Medicine | Admitting: Internal Medicine

## 2013-06-21 ENCOUNTER — Emergency Department (HOSPITAL_COMMUNITY): Payer: Medicare Other

## 2013-06-21 DIAGNOSIS — C8510 Unspecified B-cell lymphoma, unspecified site: Secondary | ICD-10-CM

## 2013-06-21 DIAGNOSIS — R569 Unspecified convulsions: Secondary | ICD-10-CM

## 2013-06-21 DIAGNOSIS — I4891 Unspecified atrial fibrillation: Secondary | ICD-10-CM

## 2013-06-21 DIAGNOSIS — R079 Chest pain, unspecified: Secondary | ICD-10-CM

## 2013-06-21 DIAGNOSIS — E860 Dehydration: Secondary | ICD-10-CM

## 2013-06-21 DIAGNOSIS — R609 Edema, unspecified: Secondary | ICD-10-CM

## 2013-06-21 DIAGNOSIS — M7501 Adhesive capsulitis of right shoulder: Secondary | ICD-10-CM

## 2013-06-21 DIAGNOSIS — Z87891 Personal history of nicotine dependence: Secondary | ICD-10-CM

## 2013-06-21 DIAGNOSIS — N179 Acute kidney failure, unspecified: Secondary | ICD-10-CM

## 2013-06-21 DIAGNOSIS — J4489 Other specified chronic obstructive pulmonary disease: Secondary | ICD-10-CM

## 2013-06-21 DIAGNOSIS — I739 Peripheral vascular disease, unspecified: Secondary | ICD-10-CM

## 2013-06-21 DIAGNOSIS — I8310 Varicose veins of unspecified lower extremity with inflammation: Secondary | ICD-10-CM

## 2013-06-21 DIAGNOSIS — Z800 Family history of malignant neoplasm of digestive organs: Secondary | ICD-10-CM

## 2013-06-21 DIAGNOSIS — Z79899 Other long term (current) drug therapy: Secondary | ICD-10-CM

## 2013-06-21 DIAGNOSIS — R4789 Other speech disturbances: Secondary | ICD-10-CM

## 2013-06-21 DIAGNOSIS — I6529 Occlusion and stenosis of unspecified carotid artery: Secondary | ICD-10-CM | POA: Diagnosis present

## 2013-06-21 DIAGNOSIS — F3289 Other specified depressive episodes: Secondary | ICD-10-CM

## 2013-06-21 DIAGNOSIS — R1319 Other dysphagia: Secondary | ICD-10-CM

## 2013-06-21 DIAGNOSIS — L659 Nonscarring hair loss, unspecified: Secondary | ICD-10-CM

## 2013-06-21 DIAGNOSIS — M1990 Unspecified osteoarthritis, unspecified site: Secondary | ICD-10-CM

## 2013-06-21 DIAGNOSIS — Z8711 Personal history of peptic ulcer disease: Secondary | ICD-10-CM

## 2013-06-21 DIAGNOSIS — D649 Anemia, unspecified: Secondary | ICD-10-CM | POA: Diagnosis present

## 2013-06-21 DIAGNOSIS — Z7901 Long term (current) use of anticoagulants: Secondary | ICD-10-CM

## 2013-06-21 DIAGNOSIS — G459 Transient cerebral ischemic attack, unspecified: Secondary | ICD-10-CM

## 2013-06-21 DIAGNOSIS — R4701 Aphasia: Secondary | ICD-10-CM

## 2013-06-21 DIAGNOSIS — E785 Hyperlipidemia, unspecified: Secondary | ICD-10-CM

## 2013-06-21 DIAGNOSIS — Z86718 Personal history of other venous thrombosis and embolism: Secondary | ICD-10-CM

## 2013-06-21 DIAGNOSIS — J449 Chronic obstructive pulmonary disease, unspecified: Secondary | ICD-10-CM | POA: Diagnosis present

## 2013-06-21 DIAGNOSIS — R197 Diarrhea, unspecified: Secondary | ICD-10-CM

## 2013-06-21 DIAGNOSIS — F801 Expressive language disorder: Secondary | ICD-10-CM | POA: Diagnosis present

## 2013-06-21 DIAGNOSIS — Z8521 Personal history of malignant neoplasm of larynx: Secondary | ICD-10-CM

## 2013-06-21 DIAGNOSIS — Z8673 Personal history of transient ischemic attack (TIA), and cerebral infarction without residual deficits: Secondary | ICD-10-CM | POA: Diagnosis present

## 2013-06-21 DIAGNOSIS — M542 Cervicalgia: Secondary | ICD-10-CM

## 2013-06-21 DIAGNOSIS — R0602 Shortness of breath: Secondary | ICD-10-CM

## 2013-06-21 DIAGNOSIS — K219 Gastro-esophageal reflux disease without esophagitis: Secondary | ICD-10-CM

## 2013-06-21 DIAGNOSIS — M1710 Unilateral primary osteoarthritis, unspecified knee: Secondary | ICD-10-CM

## 2013-06-21 DIAGNOSIS — K632 Fistula of intestine: Secondary | ICD-10-CM

## 2013-06-21 DIAGNOSIS — I2510 Atherosclerotic heart disease of native coronary artery without angina pectoris: Secondary | ICD-10-CM | POA: Diagnosis present

## 2013-06-21 DIAGNOSIS — R509 Fever, unspecified: Secondary | ICD-10-CM

## 2013-06-21 DIAGNOSIS — K5733 Diverticulitis of large intestine without perforation or abscess with bleeding: Secondary | ICD-10-CM

## 2013-06-21 DIAGNOSIS — I10 Essential (primary) hypertension: Secondary | ICD-10-CM

## 2013-06-21 DIAGNOSIS — M79641 Pain in right hand: Secondary | ICD-10-CM

## 2013-06-21 DIAGNOSIS — Z96659 Presence of unspecified artificial knee joint: Secondary | ICD-10-CM

## 2013-06-21 DIAGNOSIS — I639 Cerebral infarction, unspecified: Secondary | ICD-10-CM

## 2013-06-21 DIAGNOSIS — R5383 Other fatigue: Secondary | ICD-10-CM

## 2013-06-21 DIAGNOSIS — F411 Generalized anxiety disorder: Secondary | ICD-10-CM | POA: Diagnosis present

## 2013-06-21 DIAGNOSIS — M19049 Primary osteoarthritis, unspecified hand: Secondary | ICD-10-CM

## 2013-06-21 DIAGNOSIS — Z823 Family history of stroke: Secondary | ICD-10-CM

## 2013-06-21 DIAGNOSIS — K625 Hemorrhage of anus and rectum: Secondary | ICD-10-CM

## 2013-06-21 DIAGNOSIS — K432 Incisional hernia without obstruction or gangrene: Secondary | ICD-10-CM

## 2013-06-21 DIAGNOSIS — I6350 Cerebral infarction due to unspecified occlusion or stenosis of unspecified cerebral artery: Principal | ICD-10-CM | POA: Diagnosis present

## 2013-06-21 DIAGNOSIS — R5381 Other malaise: Secondary | ICD-10-CM

## 2013-06-21 DIAGNOSIS — K279 Peptic ulcer, site unspecified, unspecified as acute or chronic, without hemorrhage or perforation: Secondary | ICD-10-CM

## 2013-06-21 DIAGNOSIS — R4781 Slurred speech: Secondary | ICD-10-CM

## 2013-06-21 DIAGNOSIS — F329 Major depressive disorder, single episode, unspecified: Secondary | ICD-10-CM | POA: Diagnosis present

## 2013-06-21 DIAGNOSIS — F32A Depression, unspecified: Secondary | ICD-10-CM | POA: Diagnosis present

## 2013-06-21 LAB — POCT I-STAT, CHEM 8
Calcium, Ion: 1.31 mmol/L — ABNORMAL HIGH (ref 1.13–1.30)
Chloride: 103 mEq/L (ref 96–112)
Glucose, Bld: 105 mg/dL — ABNORMAL HIGH (ref 70–99)
HCT: 41 % (ref 36.0–46.0)
Sodium: 143 mEq/L (ref 135–145)
TCO2: 27 mmol/L (ref 0–100)

## 2013-06-21 LAB — CBC
HCT: 42.9 % (ref 36.0–46.0)
MCHC: 33.1 g/dL (ref 30.0–36.0)
MCV: 89.6 fL (ref 78.0–100.0)
Platelets: 177 10*3/uL (ref 150–400)
RDW: 13.7 % (ref 11.5–15.5)
WBC: 5.5 10*3/uL (ref 4.0–10.5)

## 2013-06-21 LAB — DIFFERENTIAL
Basophils Absolute: 0 10*3/uL (ref 0.0–0.1)
Basophils Relative: 1 % (ref 0–1)
Eosinophils Absolute: 0.3 10*3/uL (ref 0.0–0.7)
Eosinophils Relative: 5 % (ref 0–5)
Monocytes Absolute: 0.6 10*3/uL (ref 0.1–1.0)
Neutro Abs: 3.2 10*3/uL (ref 1.7–7.7)

## 2013-06-21 LAB — COMPREHENSIVE METABOLIC PANEL
ALT: 25 U/L (ref 0–35)
AST: 30 U/L (ref 0–37)
Albumin: 4.3 g/dL (ref 3.5–5.2)
Calcium: 10.3 mg/dL (ref 8.4–10.5)
Creatinine, Ser: 1.03 mg/dL (ref 0.50–1.10)
Sodium: 140 mEq/L (ref 135–145)
Total Protein: 8 g/dL (ref 6.0–8.3)

## 2013-06-21 LAB — APTT: aPTT: 29 seconds (ref 24–37)

## 2013-06-21 LAB — GLUCOSE, CAPILLARY
Glucose-Capillary: 96 mg/dL (ref 70–99)
Glucose-Capillary: 82 mg/dL (ref 70–99)
Glucose-Capillary: 112 mg/dL — ABNORMAL HIGH (ref 70–99)

## 2013-06-21 LAB — URINALYSIS, ROUTINE W REFLEX MICROSCOPIC
Bilirubin Urine: NEGATIVE
Ketones, ur: NEGATIVE mg/dL
Nitrite: NEGATIVE
Protein, ur: NEGATIVE mg/dL
Urobilinogen, UA: 0.2 mg/dL (ref 0.0–1.0)
pH: 7 (ref 5.0–8.0)

## 2013-06-21 LAB — RAPID URINE DRUG SCREEN, HOSP PERFORMED
Amphetamines: NOT DETECTED
Barbiturates: NOT DETECTED
Benzodiazepines: NOT DETECTED
Cocaine: NOT DETECTED
Tetrahydrocannabinol: NOT DETECTED

## 2013-06-21 LAB — POCT I-STAT TROPONIN I: Troponin i, poc: 0.01 ng/mL (ref 0.00–0.08)

## 2013-06-21 LAB — PROTIME-INR: Prothrombin Time: 13.3 seconds (ref 11.6–15.2)

## 2013-06-21 LAB — ETHANOL: Alcohol, Ethyl (B): <11 mg/dL (ref 0–11)

## 2013-06-21 MED ORDER — ALPRAZOLAM 0.5 MG PO TABS
0.5000 mg | ORAL_TABLET | Freq: Every evening | ORAL | Status: DC | PRN
  Administered 2013-06-22: 0.5 mg via ORAL
  Filled 2013-06-21: qty 1

## 2013-06-21 MED ORDER — NEOMYCIN-POLYMYXIN-DEXAMETH 3.5-10000-0.1 OP SUSP
1.0000 [drp] | Freq: Four times a day (QID) | OPHTHALMIC | Status: DC
  Administered 2013-06-21 – 2013-06-23 (×3): 1 [drp] via OPHTHALMIC
  Filled 2013-06-21 (×2): qty 5

## 2013-06-21 MED ORDER — ALPRAZOLAM 0.5 MG PO TABS
0.5000 mg | ORAL_TABLET | Freq: Once | ORAL | Status: AC | PRN
  Administered 2013-06-21: 0.5 mg via ORAL
  Filled 2013-06-21: qty 1

## 2013-06-21 MED ORDER — APIXABAN 2.5 MG PO TABS
2.5000 mg | ORAL_TABLET | Freq: Two times a day (BID) | ORAL | Status: DC
  Administered 2013-06-21 – 2013-06-22 (×2): 2.5 mg via ORAL
  Filled 2013-06-21 (×3): qty 1

## 2013-06-21 MED ORDER — BISOPROLOL FUMARATE 5 MG PO TABS
5.0000 mg | ORAL_TABLET | Freq: Every day | ORAL | Status: DC
  Administered 2013-06-21: 5 mg via ORAL
  Filled 2013-06-21 (×3): qty 1

## 2013-06-21 MED ORDER — DULOXETINE HCL 30 MG PO CPEP
30.0000 mg | ORAL_CAPSULE | Freq: Every day | ORAL | Status: DC
  Administered 2013-06-21 – 2013-06-23 (×3): 30 mg via ORAL
  Filled 2013-06-21 (×3): qty 1

## 2013-06-21 MED ORDER — SODIUM CHLORIDE 0.9 % IV SOLN
INTRAVENOUS | Status: DC
  Administered 2013-06-22 (×2): via INTRAVENOUS

## 2013-06-21 MED ORDER — LEVETIRACETAM 500 MG PO TABS
1000.0000 mg | ORAL_TABLET | Freq: Two times a day (BID) | ORAL | Status: DC
  Administered 2013-06-21 – 2013-06-22 (×2): 1000 mg via ORAL
  Filled 2013-06-21 (×3): qty 2

## 2013-06-21 MED ORDER — ACETAMINOPHEN 325 MG PO TABS
650.0000 mg | ORAL_TABLET | ORAL | Status: DC | PRN
  Administered 2013-06-22: 650 mg via ORAL
  Filled 2013-06-21: qty 2

## 2013-06-21 NOTE — H&P (Signed)
[facility] History and Physical  [name] ZOX:[id number] DOB: [birth year] DOA: 06/21/2013  Referring physician: PCP: Lisa Mew, MD  Specialists:   Chief Complaint: Abnormal speech  HPI: [name] is a 79 y.o. female  History of stroke and frequent admissions for TIAs, recent seizure, hypertension hyperlipidemia, COPD, atrial fibrillation requiring anticoagulation that presents emergency room with abnormal speech. Patient was seen approximately 10:30 this morning by her husband and was found to be normal at that time. Approximately 30, the husband states that his wife called out to him. After that point patient was no longer able to speak. After approximately 10 minutes, patient did not recognize her family and was only oriented to self. She began having garbled speech which made no sense. Patient's family stated that yesterday and this morning she was normal and having breakfast able to carry out her normal daily activities. Patient has been on Coumadin the past and was started on Pradaxa in approximately one month ago. However due to history of stroke and several TIAs patient was switched over to Eliquis. At this time, patient is able to speak clearly and understand. She currently has no complaints. She does not remember the events that occurred this morning. Patient was very hesitant regarding being admitted as well as obtaining another MRI. She denies any recent illness, chest pain, shortness of breath, travel.  Review of Systems:  Constitutional: Denies fever, chills, diaphoresis, appetite change and fatigue.  HEENT: Denies photophobia, eye pain, redness, hearing loss, ear pain, congestion, sore throat, rhinorrhea, sneezing, mouth sores, trouble swallowing, neck pain, neck stiffness and tinnitus.   Respiratory: Denies SOB, DOE, cough, chest tightness,  and wheezing.   Cardiovascular: Denies chest pain, palpitations and leg swelling.  Gastrointestinal: Denies  nausea, vomiting, abdominal pain, diarrhea, constipation, blood in stool and abdominal distention.  Genitourinary: Denies dysuria, urgency, frequency, hematuria, flank pain and difficulty urinating.  Musculoskeletal: Denies myalgias, back pain, joint swelling, arthralgias and gait problem.  Skin: Denies pallor, rash and wound.  Neurological: Denies dizziness, seizures, syncope, weakness, light-headedness, numbness and headaches.  Hematological: Denies adenopathy. Easy bruising, personal or family bleeding history  Psychiatric/Behavioral: Denies suicidal ideation, mood changes, confusion, nervousness, sleep disturbance and agitation  Past Medical History  Diagnosis Date  . GERD (gastroesophageal reflux disease)   . History of TIAs   . Diverticulosis   . PUD (peptic ulcer disease)   . PVD (peripheral vascular disease)   . Hyperlipidemia   . Anxiety   . Depression   . Blood transfusion     "w/1st miscarriage" (05/26/2013)  . COPD (chronic obstructive pulmonary disease)   . Hypertension   . PONV (postoperative nausea and vomiting)   . Family history of anesthesia complication     "sisters also get sick as a dog" (05/26/2013)  . Coronary artery disease   . Heart murmur     "when I was a child" (05/26/2013)  . DVT (deep venous thrombosis) 1954    "after I had my first child" (05/26/2013)  . Pneumonia 2013    "once" (05/26/2013)  . Exertional shortness of breath   . H/O hiatal hernia   . Stroke 10/2002    no residual  . Stroke 05/26/2013    "very mild; affected my speech for a while" (05/26/2013)  . Arthritis     "lots; hands" (05/26/2013)  . 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 Lymphona, mantle cell, inguinal region/lower limb     "  large c-cell" (05/26/2013)   Past Surgical History  Procedure Laterality Date  . 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 Tubal ligation  1972   . Radical neck dissection  1986    "larynx cancer" (05/26/2013)  . Total knee arthroplasty Bilateral     2002 left 2008 right  . Ileostomy closure  01/2010  . Cataract extraction  2005    bilateral  . Carotid endarterectomy Bilateral 2004    2004 a month apart right and left  . Tonsillectomy and adenoidectomy  1936    "tonsils grew back; no 2nd OR" (05/26/2013)  . Hernia repair  05/2010    ventral hernia repair  . Carpal tunnel release Left ?1960's  . Abdominal exploration surgery  12/01/2012  . Appendectomy  09/13/2008  . Colon surgery  09/13/2008    Laparoscopic assisted converted to open sigmoid colectomy.Hattie Perch 09/13/2008 (05/26/2013)  . Cataract extraction w/ intraocular lens  implant, bilateral Bilateral ~ 2003  . Dilation and curettage of uterus  1950's    "had 2 for miscarriages" (05/26/2013)   Social History:  reports that she quit smoking about 28 years ago. Her smoking use included Cigarettes. She has a 92.5 pack-year smoking history. She has never used smokeless tobacco. She reports that she does not drink alcohol or use illicit drugs. Patient lives at home with her husband. She is currently undergoing speech as well as physical therapy.  Allergies  Allergen Reactions  . Propoxyphene Hcl     Rxn: unknown    Family History  Problem Relation Age of Onset  . Colon cancer Sister     colon  . Hyperlipidemia Mother   . Stroke Mother   . Cancer Father     mesothelioma    Prior to Admission medications   Medication Sig Start Date End Date Taking? Authorizing Provider  ALPRAZolam Prudy Feeler) 0.5 MG tablet Take 1 tablet (0.5 mg total) by mouth at bedtime as needed for sleep. 05/15/13  Yes Stacie Glaze, MD  apixaban (ELIQUIS) 2.5 MG TABS tablet Take 1 tablet (2.5 mg total) by mouth 2 (two) times daily. 05/29/13  Yes Leroy Sea, MD  bisoprolol (ZEBETA) 5 MG tablet Take 5 mg by mouth daily at 12 noon.    Yes Historical Provider, MD  Cyanocobalamin (VITAMIN B12 PO) Take  5,000 mcg by mouth daily.   Yes Historical Provider, MD  diclofenac sodium (VOLTAREN) 1 % GEL Apply 2 g topically 4 (four) times daily as needed (pain; apply to hand).    Yes Historical Provider, MD  DULoxetine (CYMBALTA) 30 MG capsule Take 1 capsule (30 mg total) by mouth daily. 06/09/13  Yes Baker Pierini, FNP  levETIRAcetam (KEPPRA) 500 MG tablet Take 1,000 mg by mouth 2 (two) times daily.   Yes Historical Provider, MD  loperamide (IMODIUM) 2 MG capsule Take 2 mg by mouth daily as needed for diarrhea or loose stools.    Yes Historical Provider, MD  midodrine (PROAMATINE) 5 MG tablet Take 1 tablet (5 mg total) by mouth 3 (three) times daily with meals. 05/29/13  Yes Leroy Sea, MD  neomycin-polymyxin b-dexamethasone (MAXITROL) 3.5-10000-0.1 SUSP Place 1 drop into the left eye every 6 (six) hours. 06/14/13  Yes Baker Pierini, FNP  Vitamin D, Ergocalciferol, (DRISDOL) 50000 UNITS CAPS Take 50,000 Units by mouth every Sunday.   Yes Historical Provider, MD   Physical Exam: Filed Vitals:   06/21/13 1443  BP:   Pulse:  Temp: 98.7 F (37.1 C)  Resp:      General: Well developed, well nourished, NAD, appears stated age  HEENT: NCAT, PERRLA, EOMI, Anicteic Sclera, mucous membranes moist. No pharyngeal erythema or exudates  Neck: Supple, no JVD, no masses  Cardiovascular: S1 S2 auscultated, no rubs, murmurs or gallops. Regular rate and rhythm.  Respiratory: Clear to auscultation bilaterally with equal chest rise  Abdomen: Soft, nontender, nondistended, + bowel sounds  Extremities: warm dry without cyanosis clubbing or edema  Neuro: AAOx3, cranial nerves grossly intact. Strength 5/5 in patient's upper and lower extremities bilaterally  Skin: Without rashes exudates or nodules  Psych: Normal affect and demeanor with intact judgement and insight  Labs on Admission:  Basic Metabolic Panel:  Recent Labs Lab 06/16/13 0857 06/16/13 0906 06/21/13 1305 06/21/13 1335   NA 140 142 140 143  K 3.8 3.7 4.2 4.1  CL 104 104 102 103  CO2 24  --  26  --   GLUCOSE 138* 140* 102* 105*  BUN 13 12 12 12   CREATININE 1.13* 1.40* 1.03 1.30*  CALCIUM 9.6  --  10.3  --    Liver Function Tests:  Recent Labs Lab 06/16/13 0857 06/21/13 1305  AST 26 30  ALT 23 25  ALKPHOS 64 75  BILITOT 0.7 0.6  PROT 7.1 8.0  ALBUMIN 3.9 4.3   No results found for this basename: LIPASE, AMYLASE,  in the last 168 hours No results found for this basename: AMMONIA,  in the last 168 hours CBC:  Recent Labs Lab 06/16/13 0857 06/16/13 0906 06/21/13 1305 06/21/13 1335  WBC 3.9*  --  5.5  --   NEUTROABS 2.4  --  3.2  --   HGB 13.4 13.3 14.2 13.9  HCT 40.9 39.0 42.9 41.0  MCV 89.9  --  89.6  --   PLT 177  --  177  --    Cardiac Enzymes:  Recent Labs Lab 06/16/13 0857  TROPONINI <0.30    BNP (last 3 results) No results found for this basename: PROBNP,  in the last 8760 hours CBG:  Recent Labs Lab 06/16/13 0910 06/21/13 1256  GLUCAP 115* 96    Radiological Exams on Admission: Ct Head Wo Contrast  06/21/2013   ADDENDUM REPORT: 06/21/2013 13:16  ADDENDUM: Critical Value/emergent results were called by telephone at the time of interpretation on 06/21/2013 at 1:16 PM to Dr.Camilo, who verbally acknowledged these results.   Electronically Signed   By: Marlan Palau M.D.   On: 06/21/2013 13:16   06/21/2013   CLINICAL DATA:  Code stroke  EXAM: CT HEAD WITHOUT CONTRAST  TECHNIQUE: Contiguous axial images were obtained from the base of the skull through the vertex without intravenous contrast.  COMPARISON:  CT 06/16/2013  FINDINGS: Generalized atrophy. Chronic microvascular ischemic changes in the white matter. Chronic infarct in the deep white matter on the left is unchanged.  Negative for acute infarct. Negative for hemorrhage or mass lesion. Negative for skull lesion.  IMPRESSION: Chronic ischemic changes are stable from the prior CT. No acute abnormality.   Electronically Signed: By: Marlan Palau M.D. On: 06/21/2013 13:14    EKG: Independently reviewed. Sinus rhythm,  rate 84, normal axis  Assessment/Plan Principal Problem:   Aphasia Active Problems:   HYPERLIPIDEMIA   ANEMIA-NOS   ANXIETY   DEPRESSION   HYPERTENSION   TIA (transient ischemic attack)   AKI (acute kidney injury)   Focal seizure  Aphasia/ TIA, rule out CVA Patient will be  admitted for observation. Will conduct neuro checks. Dr. Cyril Mourning, neurologist has ordered a been consulted. Patient had frequent past admissions for stroke, TIA. She is currently on Eliquis. She has been on Coumadin as well as digoxin the past. Her previous workup approximately 2 weeks ago was negative for stroke. The last hemoglobin A 1C was conducted on 05/27/2013 found to be 5.9. Patient's last admission she was placed on Keppra and continued on a Eliquis. At this time patient's aphasia has resolved.  Carotid Doppler and October 2014 showed: Right: Mixed plaque distal CCA origin ICA, left: Mild calcific plaque distal CCA, mild to moderate calcific origin and proximal ICA, RICA 1-39% ICA stenosis, LICA 20-49% stenosis, vertebral artery flow is antegrade. Echocardiogram conducted on October 2014 shows an ejection fraction of 50-55%, grade 2 diastolic dysfunction, no cardiac source of embolism was identified.  Hypertension, uncontrolled Will hold Midodrine at this time. Will allow for permissive hypertension.  Hyperlipidemia Lipid profile October 2014: total cholesterol 163, triglycerides 176, HDL 31, LDL 97.  Patient has not seen them in the home medications were cholesterol. Consider starting statin, although LDL is below 100.  Seizure Continue Keppra.  Neurology also consulted, possible EEG.  Depression and anxiety Continue Xanax and Cymbalta  Atrial fibrillation Currently rate and rhythm controlled. Continue Eliquis.   DVT prophylaxis: Eliquis  Code Status: Full  Condition:  Guarded  Family Communication: Family at bedside. Admission, patients condition and plan of care including tests being ordered have been discussed with the patient and family  who indicate understanding and agree with the plan and Code Status.  Disposition Plan: Admitted for observation  Time spent: 45 minutes  Joanette Silveria D.O. [facility] Pager [phone]  If 7PM-7AM, please contact night-coverage [email or web address] Password [email or web address] 06/21/2013, 2:52 PM

## 2013-06-21 NOTE — ED Provider Notes (Signed)
TIME SEEN: 1:15 PM  CHIEF COMPLAINT: Code stroke  HPI: Patient is an 79 year old female with a history of hypertension, hyperlipidemia, COPD, multiple prior strokes and TIAs, possible recent seizures who presents the emergency department with acute onset of slurred speech and speech difficulty at 11:30 AM. Family reports her last no normal was 10:30 AM. They report the patient has been having difficulty with her speech today. They report that it is slurred and that when she is asked a question she responds but uses inappropriate words.  Family reports the patient had similar symptoms but at that time was having difficulty with word finding.  No recent head injury.  Pt is on Eliquis and Keppra.  No other new meds.  No drugs or ETOH.  No recent infection.  Pt denies any pain.   ROS: Unable to obtain due to patient's aphasia  PAST MEDICAL HISTORY/PAST SURGICAL HISTORY:  Past Medical History  Diagnosis Date  . GERD (gastroesophageal reflux disease)   . History of TIAs   . Diverticulosis   . PUD (peptic ulcer disease)   . PVD (peripheral vascular disease)   . Hyperlipidemia   . Anxiety   . Depression   . Blood transfusion     "w/1st miscarriage" (05/26/2013)  . COPD (chronic obstructive pulmonary disease)   . Hypertension   . PONV (postoperative nausea and vomiting)   . Family history of anesthesia complication     "sisters also get sick as a dog" (05/26/2013)  . Coronary artery disease   . Heart murmur     "when I was a child" (05/26/2013)  . DVT (deep venous thrombosis) 1954    "after I had my first child" (05/26/2013)  . Pneumonia 2013    "once" (05/26/2013)  . Exertional shortness of breath   . H/O hiatal hernia   . Stroke 10/2002    no residual  . Stroke 05/26/2013    "very mild; affected my speech for a while" (05/26/2013)  . Arthritis     "lots; hands" (05/26/2013)  . 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 Lymphona, mantle  cell, inguinal region/lower limb     "large c-cell" (05/26/2013)    MEDICATIONS:  Prior to Admission medications   Medication Sig Start Date End Date Taking? Authorizing Provider  ALPRAZolam Prudy Feeler) 0.5 MG tablet Take 1 tablet (0.5 mg total) by mouth at bedtime as needed for sleep. 05/15/13   Stacie Glaze, MD  apixaban (ELIQUIS) 2.5 MG TABS tablet Take 1 tablet (2.5 mg total) by mouth 2 (two) times daily. 05/29/13   Leroy Sea, MD  bisoprolol (ZEBETA) 5 MG tablet Take 5 mg by mouth daily at 12 noon.     Historical Provider, MD  Cyanocobalamin (VITAMIN B12 PO) Take 5,000 mcg by mouth daily.    Historical Provider, MD  diclofenac sodium (VOLTAREN) 1 % GEL Apply 2 g topically 4 (four) times daily as needed (pain; apply to hand).     Historical Provider, MD  DULoxetine (CYMBALTA) 30 MG capsule Take 1 capsule (30 mg total) by mouth daily. 06/09/13   Baker Pierini, FNP  levETIRAcetam (KEPPRA) 250 MG tablet Take 2 tablets (500 mg total) by mouth 2 (two) times daily. 06/16/13   Juliet Rude. Pickering, MD  loperamide (IMODIUM) 2 MG capsule Take 2 mg by mouth daily as needed for diarrhea or loose stools.     Historical Provider, MD  midodrine (PROAMATINE) 5 MG tablet Take  1 tablet (5 mg total) by mouth 3 (three) times daily with meals. 05/29/13   Leroy Sea, MD  neomycin-polymyxin b-dexamethasone (MAXITROL) 3.5-10000-0.1 SUSP Place 1 drop into the left eye every 6 (six) hours. 06/14/13   Baker Pierini, FNP  Vitamin D, Ergocalciferol, (DRISDOL) 50000 UNITS CAPS Take 50,000 Units by mouth every Sunday.    Historical Provider, MD    ALLERGIES:  Allergies  Allergen Reactions  . Propoxyphene Hcl     Rxn: unknown    SOCIAL HISTORY:  History  Substance Use Topics  . Smoking status: Former Smoker -- 2.50 packs/day for 37 years    Types: Cigarettes    Quit date: 09/05/1984  . Smokeless tobacco: Never Used  . Alcohol Use: No    FAMILY HISTORY: Family History  Problem Relation  Age of Onset  . Colon cancer Sister     colon  . Hyperlipidemia Mother   . Stroke Mother   . Cancer Father     mesothelioma    EXAM: There were no vitals taken for this visit. CONSTITUTIONAL: Alert but unable to answer questions appropriately and appears to be confused when asked questions; difficulty following commands; answers questions intermittently with inappropriate words HEAD: Normocephalic EYES: Conjunctivae clear, PERRL ENT: normal nose; no rhinorrhea; moist mucous membranes; pharynx without lesions noted NECK: Supple, no meningismus, no LAD  CARD: RRR; S1 and S2 appreciated; no murmurs, no clicks, no rubs, no gallops RESP: Normal chest excursion without splinting or tachypnea; breath sounds clear and equal bilaterally; no wheezes, no rhonchi, no rales,  ABD/GI: Normal bowel sounds; non-distended; soft, non-tender, no rebound, no guarding BACK:  The back appears normal and is non-tender to palpation, there is no CVA tenderness EXT: Normal ROM in all joints; non-tender to palpation; no edema; normal capillary refill; no cyanosis    SKIN: Normal color for age and race; warm NEURO: Moves all extremities equally; no drift; no obvious facial droop but will not follow commands for me to test CN; has dysarthria and receptive aphasia; NIHSS 7 PSYCH: The patient's mood and manner are appropriate. Grooming and personal hygiene are appropriate.  MEDICAL DECISION MAKING: Pt here with dysarthria and receptive aphasia that started at 10:30 AM.  NIHSS is Pt is not a TPA candidate given she recently had stroke in October 2014.  She has never received TPA.  Head CT shows no head bleed.  Glucose is 97.  She is hemodynamically stable.  Stroke team at bedside.  Labs, urine pending.   EKG Interpretation    Date/Time:  Wednesday June 21 2013 13:14:46 EST Ventricular Rate:  84 PR Interval:  190 QRS Duration: 88 QT Interval:  404 QTC Calculation: 478 R Axis:   72 Text Interpretation:  Sinus  rhythm Left atrial enlargement Anteroseptal infarct, old Baseline wander in lead(s) V3 Unchanged compared to prior EKG 06/16/13 Confirmed by Jonnatan Hanners  DO, Zakiah Gauthreaux ([id number]) on 06/21/2013 1:24:57 PM             ED PROGRESS: NIHSS has improved to 3.  Dr. Iverson Alamin with neurology has seen.  Would like medical admission for MRI.  Not TPA candidate given recent prior CVA.  Labs and urine unremarkable. Patient is still stable. Will discuss with hospitalist for admission. PCP is Dr. Darryll Capers with [facility].   Results for orders placed during the hospital encounter of 06/21/13  GLUCOSE, CAPILLARY      Result Value Range   Glucose-Capillary 96  70 - 99 mg/dL  ETHANOL  Result Value Range   Alcohol, Ethyl (B) <11  0 - 11 mg/dL  PROTIME-INR      Result Value Range   Prothrombin Time 13.3  11.6 - 15.2 seconds   INR 1.03  0.00 - 1.49  APTT      Result Value Range   aPTT 29  24 - 37 seconds  CBC      Result Value Range   WBC 5.5  4.0 - 10.5 K/uL   RBC 4.79  3.87 - 5.11 MIL/uL   Hemoglobin 14.2  12.0 - 15.0 g/dL   HCT 16.1  09.6 - 04.5 %   MCV 89.6  78.0 - 100.0 fL   MCH 29.6  26.0 - 34.0 pg   MCHC 33.1  30.0 - 36.0 g/dL   RDW 40.9  81.1 - 91.4 %   Platelets 177  150 - 400 K/uL  DIFFERENTIAL      Result Value Range   Neutrophils Relative % 58  43 - 77 %   Neutro Abs 3.2  1.7 - 7.7 K/uL   Lymphocytes Relative 26  12 - 46 %   Lymphs Abs 1.4  0.7 - 4.0 K/uL   Monocytes Relative 11  3 - 12 %   Monocytes Absolute 0.6  0.1 - 1.0 K/uL   Eosinophils Relative 5  0 - 5 %   Eosinophils Absolute 0.3  0.0 - 0.7 K/uL   Basophils Relative 1  0 - 1 %   Basophils Absolute 0.0  0.0 - 0.1 K/uL  COMPREHENSIVE METABOLIC PANEL      Result Value Range   Sodium 140  135 - 145 mEq/L   Potassium 4.2  3.5 - 5.1 mEq/L   Chloride 102  96 - 112 mEq/L   CO2 26  19 - 32 mEq/L   Glucose, Bld 102 (*) 70 - 99 mg/dL   BUN 12  6 - 23 mg/dL   Creatinine, Ser 7.82  0.50 - 1.10 mg/dL   Calcium 95.6  8.4 - 21.3  mg/dL   Total Protein 8.0  6.0 - 8.3 g/dL   Albumin 4.3  3.5 - 5.2 g/dL   AST 30  0 - 37 U/L   ALT 25  0 - 35 U/L   Alkaline Phosphatase 75  39 - 117 U/L   Total Bilirubin 0.6  0.3 - 1.2 mg/dL   GFR calc non Af Amer 49 (*) >90 mL/min   GFR calc Af Amer 57 (*) >90 mL/min  URINALYSIS, ROUTINE W REFLEX MICROSCOPIC      Result Value Range   Color, Urine YELLOW  YELLOW   APPearance CLEAR  CLEAR   Specific Gravity, Urine 1.006  1.005 - 1.030   pH 7.0  5.0 - 8.0   Glucose, UA NEGATIVE  NEGATIVE mg/dL   Hgb urine dipstick NEGATIVE  NEGATIVE   Bilirubin Urine NEGATIVE  NEGATIVE   Ketones, ur NEGATIVE  NEGATIVE mg/dL   Protein, ur NEGATIVE  NEGATIVE mg/dL   Urobilinogen, UA 0.2  0.0 - 1.0 mg/dL   Nitrite NEGATIVE  NEGATIVE   Leukocytes, UA TRACE (*) NEGATIVE  URINE MICROSCOPIC-ADD ON      Result Value Range   Squamous Epithelial / LPF RARE  RARE   WBC, UA 0-2  <3 WBC/hpf   Bacteria, UA RARE  RARE  POCT I-STAT, CHEM 8      Result Value Range   Sodium 143  135 - 145 mEq/L   Potassium 4.1  3.5 -  5.1 mEq/L   Chloride 103  96 - 112 mEq/L   BUN 12  6 - 23 mg/dL   Creatinine, Ser 4.09 (*) 0.50 - 1.10 mg/dL   Glucose, Bld 811 (*) 70 - 99 mg/dL   Calcium, Ion 9.14 (*) 1.13 - 1.30 mmol/L   TCO2 27  0 - 100 mmol/L   Hemoglobin 13.9  12.0 - 15.0 g/dL   HCT 78.2  95.6 - 21.3 %  POCT I-STAT TROPONIN I      Result Value Range   Troponin i, poc 0.01  0.00 - 0.08 ng/mL   Comment 3             Ct Head Wo Contrast  06/21/2013   ADDENDUM REPORT: 06/21/2013 13:16  ADDENDUM: Critical Value/emergent results were called by telephone at the time of interpretation on 06/21/2013 at 1:16 PM to Dr.Camilo, who verbally acknowledged these results.   Electronically Signed   By: Marlan Palau M.D.   On: 06/21/2013 13:16   06/21/2013   CLINICAL DATA:  Code stroke  EXAM: CT HEAD WITHOUT CONTRAST  TECHNIQUE: Contiguous axial images were obtained from the base of the skull through the vertex without intravenous  contrast.  COMPARISON:  CT 06/16/2013  FINDINGS: Generalized atrophy. Chronic microvascular ischemic changes in the white matter. Chronic infarct in the deep white matter on the left is unchanged.  Negative for acute infarct. Negative for hemorrhage or mass lesion. Negative for skull lesion.  IMPRESSION: Chronic ischemic changes are stable from the prior CT. No acute abnormality.  Electronically Signed: By: Marlan Palau M.D. On: 06/21/2013 13:14        Layla Maw Kandis Henry, DO 06/21/13 1438

## 2013-06-21 NOTE — ED Notes (Signed)
Family stated  That pt. Is unable to do MRI due to Claustrophobia.  Family is requesting Xanax prior to MRI.  PT. Is going to her room.  MRI notified and Reported to [place], [place]

## 2013-06-21 NOTE — Code Documentation (Signed)
79yo female arriving to [facility] via private vehicle at 1249.  Family reports that patient began having difficulty speaking at 1030.  Code Stroke called at 1301, LKW 1030, EDP exam/cleared for CT 1250, stroke team arrival at 1305, patient arrival in CT at 1300.  NIHSS 3 on arrival, see documentation for details.  Patient contraindicated for treatment with tPA d/t taking Eliquis and stroke in October 2014.  Bedside handoff with ED RN Lawson Fiscal.

## 2013-06-21 NOTE — ED Notes (Signed)
Pt. returned from CT.  Stroke team at the beside

## 2013-06-21 NOTE — Consult Note (Signed)
Referring Physician: ED    Chief Complaint: CODE STROKE: INABILITY TO SPEAK  HPI:                                                                                                                                         [name] is an 79 y.o. 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 She was last known well at 1030 am today when she called her husband and couldn't speak fluently. According to family, she was not able to talk, understand, or read. However, they didn't notice face weakness, focal weakness, and no reported HA, vertigo, double vision, imbalance, or falls. She is on apixaban. NIHSS 3. CT brain showed no acute abnormality. Patient was seen at [facility] ED 06/16/13 with possible seizure.    Date last known well:  Time last known well:  tPA Given: no, patient on apixaban. NIHSS: 3 MRS: 1  Past Medical History  Diagnosis Date  . GERD (gastroesophageal reflux disease)   . History of TIAs   . Diverticulosis   . PUD (peptic ulcer disease)   . PVD (peripheral vascular disease)   . Hyperlipidemia   . Anxiety   . Depression   . Blood transfusion     "w/1st miscarriage" (05/26/2013)  . COPD (chronic obstructive pulmonary disease)   . Hypertension   . PONV (postoperative nausea and vomiting)   . Family history of anesthesia complication     "sisters also get sick as a dog" (05/26/2013)  . Coronary artery disease   . Heart murmur     "when I was a child" (05/26/2013)  . DVT (deep venous thrombosis) 1954    "after I had my first child" (05/26/2013)  . Pneumonia 2013    "once" (05/26/2013)  . Exertional shortness of breath   . H/O hiatal hernia   . Stroke 10/2002    no residual  . Stroke 05/26/2013    "very mild; affected my speech for a while"  (05/26/2013)  . Arthritis     "lots; hands" (05/26/2013)  . 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 Lymphona, mantle cell, inguinal region/lower limb     "large c-cell" (05/26/2013)    Past Surgical History  Procedure Laterality Date  . 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 Tubal ligation  1972  . Radical neck dissection  1986    "larynx cancer" (05/26/2013)  . Total knee arthroplasty Bilateral     2002 left 2008 right  . Ileostomy closure  01/2010  . Cataract extraction  2005    bilateral  . Carotid endarterectomy Bilateral 2004    2004 a  month apart right and left  . Tonsillectomy and adenoidectomy  1936    "tonsils grew back; no 2nd OR" (05/26/2013)  . Hernia repair  05/2010    ventral hernia repair  . Carpal tunnel release Left ?1960's  . Abdominal exploration surgery  12/01/2012  . Appendectomy  09/13/2008  . Colon surgery  09/13/2008    Laparoscopic assisted converted to open sigmoid colectomy.Hattie Perch 09/13/2008 (05/26/2013)  . Cataract extraction w/ intraocular lens  implant, bilateral Bilateral ~ 2003  . Dilation and curettage of uterus  1950's    "had 2 for miscarriages" (05/26/2013)    Family History  Problem Relation Age of Onset  . Colon cancer Sister     colon  . Hyperlipidemia Mother   . Stroke Mother   . Cancer Father     mesothelioma   Social History:  reports that she quit smoking about 28 years ago. Her smoking use included Cigarettes. She has a 92.5 pack-year smoking history. She has never used smokeless tobacco. She reports that she does not drink alcohol or use illicit drugs.  Allergies:  Allergies  Allergen Reactions  . Propoxyphene Hcl     Rxn: unknown    Medications:                                                                                                                           I have reviewed the patient's current  medications.  ROS:                                                                                                                                       History obtained from chart review and family.  General ROS: negative for - chills, fatigue, fever, night sweats, weight gain or weight loss Psychological ROS: negative for - behavioral disorder, hallucinations, memory difficulties, mood swings or suicidal ideation Ophthalmic ROS: negative for - blurry vision, double vision, eye pain or loss of vision ENT ROS: negative for - epistaxis, nasal discharge, oral lesions, sore throat, tinnitus or vertigo Allergy and Immunology ROS: negative for - hives or itchy/watery eyes Hematological and Lymphatic ROS: negative for - bleeding problems, bruising or swollen lymph nodes Endocrine ROS: negative for - galactorrhea, hair pattern changes, polydipsia/polyuria or temperature intolerance Respiratory ROS: negative for - cough, hemoptysis, shortness of breath or wheezing Cardiovascular ROS: negative for - chest pain, dyspnea on exertion,  edema or irregular heartbeat Gastrointestinal ROS: negative for - abdominal pain, diarrhea, hematemesis, nausea/vomiting or stool incontinence Genito-Urinary ROS: negative for - dysuria, hematuria, incontinence or urinary frequency/urgency Musculoskeletal ROS: negative for - joint swelling or muscular weakness Neurological ROS: as noted in HPI Dermatological ROS: negative for rash and skin lesion changes   Physical exam: pleasant female in no apparent distress.Blood pressure 182/96, pulse 83, resp. rate 16, height 5\' 3"  (1.6 m), weight 86.183 kg (190 lb), SpO2 94.00%.  Head: normocephalic. Neck: supple, no bruits, no JVD. Cardiac: no murmurs. Lungs: clear. Abdomen: soft, no tender, no mass. Extremities: no edema.  Neurologic Examination:                                                                                                      Mental Status: Alert,  oriented, thought content appropriate. Mild expressive dysphasia.  Able to follow 3 step commands without difficulty. Cranial Nerves: II: Discs flat bilaterally; Visual fields grossly normal, pupils equal, round, reactive to light and accommodation III,IV, VI: ptosis not present, extra-ocular motions intact bilaterally V,VII: smile symmetric, facial light touch sensation normal bilaterally VIII: hearing normal bilaterally IX,X: gag reflex present XI: bilateral shoulder shrug XII: midline tongue extension without atrophy or fasciculations  Motor: Right : Upper extremity   5/5    Left:     Upper extremity   5/5  Lower extremity   5/5     Lower extremity   5/5 Tone and bulk:normal tone throughout; no atrophy noted Sensory: Pinprick and light touch intact throughout, bilaterally Deep Tendon Reflexes:  Right: Upper Extremity   Left: Upper extremity   biceps (C-5 to C-6) 2/4   biceps (C-5 to C-6) 2/4 tricep (C7) 2/4    triceps (C7) 2/4 Brachioradialis (C6) 2/4  Brachioradialis (C6) 2/4  Lower Extremity Lower Extremity  quadriceps (L-2 to L-4) 2/4   quadriceps (L-2 to L-4) 2/4 Achilles (S1) 2/4   Achilles (S1) 2/4  Plantars: Right: downgoing   Left: downgoing Cerebellar: normal finger-to-nose,  normal heel-to-shin test Gait:  No ataxia. CV: pulses palpable throughout    Results for orders placed during the hospital encounter of 06/21/13 (from the past 48 hour(s))  GLUCOSE, CAPILLARY     Status: None   Collection Time    06/21/13 12:56 PM      Result Value Range   Glucose-Capillary 96  70 - 99 mg/dL   Ct Head Wo Contrast  06/21/2013   ADDENDUM REPORT: 06/21/2013 13:16  ADDENDUM: Critical Value/emergent results were called by telephone at the time of interpretation on 06/21/2013 at 1:16 PM to Dr.Camilo, who verbally acknowledged these results.   Electronically Signed   By: Marlan Palau M.D.   On: 06/21/2013 13:16   06/21/2013   CLINICAL DATA:  Code stroke  EXAM: CT HEAD WITHOUT  CONTRAST  TECHNIQUE: Contiguous axial images were obtained from the base of the skull through the vertex without intravenous contrast.  COMPARISON:  CT 06/16/2013  FINDINGS: Generalized atrophy. Chronic microvascular ischemic changes in the white matter. Chronic infarct in the deep white matter on  the left is unchanged.  Negative for acute infarct. Negative for hemorrhage or mass lesion. Negative for skull lesion.  IMPRESSION: Chronic ischemic changes are stable from the prior CT. No acute abnormality.  Electronically Signed: By: Marlan Palau M.D. On: 06/21/2013 13:14     Assessment: 79 y.o. female with multiple risk factors for stroke, comes in with dysphasia and NIHSS 3. She is on apixaban and thus not a candidate for IV thrombolysis. Low NIHSS and thus doubt involvement major arterial intracranial involvement. Ictal dysphasia?. Continue keppra and Apixaban. Admit to medicine and get MRI brain and EEG. Will follow up.   Wyatt Portela, MD [facility] [phone]  06/21/2013, 1:29 PM

## 2013-06-21 NOTE — Progress Notes (Signed)
Report received from Lorrie, RN from ED.

## 2013-06-22 ENCOUNTER — Observation Stay (HOSPITAL_COMMUNITY): Payer: Medicare Other

## 2013-06-22 DIAGNOSIS — I4891 Unspecified atrial fibrillation: Secondary | ICD-10-CM

## 2013-06-22 DIAGNOSIS — I6350 Cerebral infarction due to unspecified occlusion or stenosis of unspecified cerebral artery: Principal | ICD-10-CM

## 2013-06-22 LAB — BASIC METABOLIC PANEL
BUN: 10 mg/dL (ref 6–23)
Chloride: 104 mEq/L (ref 96–112)
Creatinine, Ser: 1 mg/dL (ref 0.50–1.10)
GFR calc Af Amer: 59 mL/min — ABNORMAL LOW (ref >90)
Potassium: 3.8 mEq/L (ref 3.5–5.1)
Sodium: 139 mEq/L (ref 135–145)

## 2013-06-22 LAB — GLUCOSE, CAPILLARY
Glucose-Capillary: 92 mg/dL (ref 70–99)
Glucose-Capillary: 90 mg/dL (ref 70–99)
Glucose-Capillary: 95 mg/dL (ref 70–99)
Glucose-Capillary: 98 mg/dL (ref 70–99)

## 2013-06-22 MED ORDER — LEVETIRACETAM 750 MG PO TABS
750.0000 mg | ORAL_TABLET | Freq: Two times a day (BID) | ORAL | Status: DC
  Administered 2013-06-22 – 2013-06-23 (×2): 750 mg via ORAL
  Filled 2013-06-22 (×4): qty 1

## 2013-06-22 MED ORDER — ATORVASTATIN CALCIUM 10 MG PO TABS
10.0000 mg | ORAL_TABLET | Freq: Every day | ORAL | Status: DC
  Administered 2013-06-22: 10 mg via ORAL
  Filled 2013-06-22 (×2): qty 1

## 2013-06-22 MED ORDER — RIVAROXABAN 15 MG PO TABS
15.0000 mg | ORAL_TABLET | Freq: Every day | ORAL | Status: DC
  Administered 2013-06-22: 15 mg via ORAL
  Filled 2013-06-22 (×2): qty 1

## 2013-06-22 NOTE — Evaluation (Signed)
Speech Language Pathology Evaluation Patient Details Name: [name] MRN: [id number] DOB: 11-17-1927 Today's Date: 06/22/2013 Time: 7425-9563 SLP Time Calculation (min): 50 min  Problem List:  Patient Active Problem List   Diagnosis Date Noted  . Focal seizure 06/16/2013  . Slurred speech 05/31/2013  . TIA (transient ischemic attack) 05/31/2013  . AKI (acute kidney injury) 05/31/2013  . Aphasia 05/26/2013  . Bilateral hand pain 01/09/2013  . Fever 03/27/2012  . Dehydration 03/27/2012  . Large B-cell lymphoma 02/18/2012  . Hemorrhage of rectum and anus 01/19/2012  . Family history of malignant neoplasm of gastrointestinal tract 01/19/2012  . Bursitis of shoulder, right, adhesive 10/30/2010  . ABDOMINAL INCISIONAL HERNIA 04/03/2010  . KNEE JOINT REPLACEMENT BY OTHER MEANS 02/21/2010  . CHEST PAIN 12/23/2009  . DIARRHEA 09/11/2009  . NECK AND BACK PAIN 03/18/2009  . VARICOSE VEINS LOWER EXTREMITIES W/INFLAMMATION 01/15/2009  . EDEMA 01/15/2009  . Atrial fibrillation 12/14/2008  . ANEMIA-NOS 10/20/2008  . ANXIETY 10/20/2008  . DEPRESSION 10/20/2008  . PERIPHERAL VASCULAR DISEASE 10/20/2008  . WEAKNESS 10/20/2008  . OTHER DYSPHAGIA 10/20/2008  . DIVERTICULITIS OF COLON WITH HEMORRHAGE 07/24/2008  . FISTULA OF INTESTINE EXCLUDING RECTUM AND ANUS 07/10/2008  . PEPTIC ULCER DISEASE 07/06/2008  . DYSPNEA 07/06/2008  . LOC OSTEOARTHROS NOT SPEC WHETHER PRIM/SEC HAND 04/03/2008  . COPD 03/26/2008  . HAIR LOSS 10/06/2007  . GERD 05/03/2007  . OSTEOARTHROSIS, LOCAL NOS, LOWER LEG 05/03/2007  . OSTEOARTHRITIS 05/03/2007  . HYPERLIPIDEMIA 03/18/2007  . HYPERTENSION 03/18/2007   Past Medical History:  Past Medical History  Diagnosis Date  . GERD (gastroesophageal reflux disease)   . History of TIAs   . Diverticulosis   . PUD (peptic ulcer disease)   . PVD (peripheral vascular disease)   . Hyperlipidemia   . Anxiety   . Depression   . Blood transfusion     "w/1st  miscarriage" (05/26/2013)  . COPD (chronic obstructive pulmonary disease)   . Hypertension   . PONV (postoperative nausea and vomiting)   . Family history of anesthesia complication     "sisters also get sick as a dog" (05/26/2013)  . Coronary artery disease   . Heart murmur     "when I was a child" (05/26/2013)  . DVT (deep venous thrombosis) 1954    "after I had my first child" (05/26/2013)  . Pneumonia 2013    "once" (05/26/2013)  . Exertional shortness of breath   . H/O hiatal hernia   . Stroke 10/2002    no residual  . Stroke 05/26/2013    "very mild; affected my speech for a while" (05/26/2013)  . Arthritis     "lots; hands" (05/26/2013)  . 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 Lymphona, mantle cell, inguinal region/lower limb     "large c-cell" (05/26/2013)   Past Surgical History:  Past Surgical History  Procedure Laterality Date  . 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 Tubal ligation  1972  . Radical neck dissection  1986    "larynx cancer" (05/26/2013)  . Total knee arthroplasty Bilateral     2002 left 2008 right  . Ileostomy closure  01/2010  . Cataract extraction  2005    bilateral  . Carotid endarterectomy Bilateral 2004    2004 a month apart right and left  . Tonsillectomy and adenoidectomy  1936    "tonsils grew back; no 2nd OR" (05/26/2013)  .  Hernia repair  05/2010    ventral hernia repair  . Carpal tunnel release Left ?1960's  . Abdominal exploration surgery  12/01/2012  . Appendectomy  09/13/2008  . Colon surgery  09/13/2008    Laparoscopic assisted converted to open sigmoid colectomy.Hattie Perch 09/13/2008 (05/26/2013)  . Cataract extraction w/ intraocular lens  implant, bilateral Bilateral ~ 2003  . Dilation and curettage of uterus  1950's    "had 2 for miscarriages" (05/26/2013)   HPI:  79 y.o. female, admitted 06/21/13 with abnormal speech.  Pt  has had recent 2 admissions suspicious for CVA and then TIA, switched from Pradaxa to Eliquis 2 admissions ago.  Recently, severe orthostatic hypotension, peptic ulcer disease, PAD, depression, COPD, hypertension, CAD, DVT in the past.    Assessment / Plan / Recommendation Clinical Impression  Pt presents with very different deficits than seen on prior SLP evaluations.  Pt was resistant to answer yes/no questions, refused to follow verbal commands.  She refused to complete automatic sequences, and would not repeat words longer than 3 syllables or phrases.  Confrontation naming of objects around the room was accurate, but her definition of "continue" was "probably wouldn't know".  She was able to answer some biographical questions accurately - hsbands name, length of marriage, grew up in [place].  Open ended questions revealed neologistic paraphasias and perseverative responses.  Pt appears to present with language of confusion, but specific findings regarding receptive ability could not be determined at this time due to pt's unwillingness to answer some questions and follow some instructions.  Pt reported the year as being "215 soon", and the month as "june 15", however, cognitive status cannot be accurately determined due to receptive and expressive language impairments.  Information in H&P indicates abnormal speech has resolved.  Family input regarding actual baseline (dis)abilities would be helpful in appropriate goal setting.    SLP Assessment  Patient needs continued Speech Lanaguage Pathology Services    Follow Up Recommendations  Outpatient SLP;Home health SLP    Frequency and Duration min 1 x/week  1 week   Pertinent Vitals/Pain VSS, no pain reported   SLP Goals  SLP Goals Potential to Achieve Goals: Fair Potential Considerations: Co-morbidities;Previous level of function;Severity of impairments;Cooperation/participation level;Ability to learn/carryover information  SLP Evaluation Prior  Functioning  Cognitive/Linguistic Baseline: Baseline deficits Baseline deficit details: aphasia Type of Home: House  Lives With: Spouse Available Help at Discharge: Available 24 hours/day;Family Education: high school graduate per pt? Vocation: Retired   IT consultant  Overall Cognitive Status: History of cognitive impairments - at baseline Arousal/Alertness: Awake/alert Orientation Level: Oriented to person;Disoriented to place;Disoriented to time;Disoriented to situation    Comprehension  Auditory Comprehension Overall Auditory Comprehension: Impaired at baseline Yes/No Questions: Impaired ("maybe" and shoulder shrugs frequently used) Basic Biographical Questions: 26-50% accurate Basic Immediate Environment Questions: 25-49% accurate Complex Questions: 25-49% accurate Commands: Impaired One Step Basic Commands: 0-24% accurate (pt refused, stating "no") Two Step Basic Commands: 0-24% accurate Multistep Basic Commands: 0-24% accurate Conversation: Simple (impaired) Visual Recognition/Discrimination Discrimination: Not tested Reading Comprehension Reading Status: Not tested    Expression Verbal Expression Overall Verbal Expression: Impaired at baseline Initiation: No impairment Automatic Speech:  (pt refused to cooperate) Level of Generative/Spontaneous Verbalization: Word;Phrase Repetition: Impaired Level of Impairment: Phrase level (unable or unwilling?) Naming: Impairment Responsive: 51-75% accurate Confrontation: Within functional limits Convergent: 0-24% accurate Divergent: 0-24% accurate Verbal Errors: Language of confusion;Not aware of errors;Perseveration;Jargon Non-Verbal Means of Communication: Not applicable Other Verbal Expression Comments: short responses (1-2 words) tended to  be accurate.  Responses to open ended questions were neologistic and perseverative. Written Expression Dominant Hand: Right Written Expression: Not tested   Oral / Motor Oral  Motor/Sensory Function Overall Oral Motor/Sensory Function: Appears within functional limits for tasks assessed Motor Speech Overall Motor Speech: Impaired at baseline Respiration: Within functional limits Phonation: Hoarse;Low vocal intensity (hx laryngeal CA) Resonance: Within functional limits Articulation: Within functional limitis Motor Planning: Witnin functional limits Motor Speech Errors: Not applicable   GO   Celia B. Murvin [facility], CCC-SLP [phone]  Leigh Aurora 06/22/2013, 4:22 PM

## 2013-06-22 NOTE — Progress Notes (Signed)
Utilization review completed. Asante Blanda, RN, BSN. 

## 2013-06-22 NOTE — Progress Notes (Signed)
[facility]                                                                                Patient Demographics  [name], is a 79 y.o. female, DOB - 08/07/1927, WUJ:[id number]  Admit date - 06/21/2013   Admitting Physician Lisa Petrin, DO  Outpatient Primary MD for the patient is Lisa Mew, MD  LOS - 1   No chief complaint on file.       Assessment & Plan    Principal Problem:   Aphasia Active Problems:   HYPERLIPIDEMIA   ANEMIA-NOS   ANXIETY   DEPRESSION   HYPERTENSION   TIA (transient ischemic attack)   AKI (acute kidney injury)   Focal seizure  Aphasia/ TIA, rule out CVA -MRI shows: Expected evolution of the recent left corona radiata lacunar  infarct, but now with 2 new punctate areas of restricted diffusion in the hemispheric white matter, 1 in each hemisphere, compatible with acute lacunar infarcts. Given the history of atrial fibrillation this suggests a recent mild embolic event. -Neurology consulted and following. -Patient will be switched to Xarelto.  Hypertension -Controlled, midodrine held.  Hyperlipidemia  Lipid profile October 2014: total cholesterol 163, triglycerides 176, HDL 31, LDL 97. Patient has not seen them in the home medications were cholesterol. Consider starting statin, although LDL is below 100.  Seizure  -Neurology consulted and following. -Continue Keppra, neurology increasing dose.   -Pending EEG  Depression and anxiety  Continue Xanax and Cymbalta   Atrial fibrillation  -Currently rate and rhythm controlled.  -Patient will switched to Xarelto by neurology.  Code Status: Full  Family Communication: Daughter at bedside  Disposition Plan: Admitted.  Will start on Xarelto and continue monitoring patient.  Likely discharge 06/23/2013.   Procedures  Echo 05/2013: ejection fraction of 50-55%, grade 2 diastolic dysfunction, no cardiac source of embolism was identified.  Carotid Doppler 05/2013 Right:  Mixed plaque distal CCA origin ICA, left: Mild calcific plaque distal CCA, mild to moderate calcific origin and proximal ICA, RICA 1-39% ICA stenosis, LICA 20-49% stenosis, vertebral artery flow is antegrade.  Consults   Neurology  DVT Prophylaxis  Eliquis switch to Xarelto  Lab Results  Component Value Date   PLT 177 06/21/2013    Medications  Scheduled Meds: . atorvastatin  10 mg Oral q1800  . bisoprolol  5 mg Oral Q1200  . DULoxetine  30 mg Oral Daily  . levETIRAcetam  750 mg Oral BID  . neomycin-polymyxin b-dexamethasone  1 drop Left Eye Q6H  . rivaroxaban  15 mg Oral QPC supper   Continuous Infusions: . sodium chloride 75 mL/hr at 06/22/13 0050   PRN Meds:.acetaminophen, ALPRAZolam  Antibiotics    Anti-infectives   None       Time Spent in minutes   20 minutes   [name] D.O. on 06/22/2013 at 12:21 PM  Between 7am to 7pm - Pager - [phone]  After 7pm go to [email or web address] - password [email or web address]  And look for the night coverage person covering for me after hours  [facility] Office  [phone]    Subjective:   [name] seen and examined today. Patient has no complaints  this morning.   Patient able to answer all questions appropriately, however, still has some confusion.  Patient denies dizziness, chest pain, shortness of breath, abdominal pain, N/V/D/C, new weakness, numbess, tingling.    Objective:   Filed Vitals:   06/22/13 0330 06/22/13 0341 06/22/13 0514 06/22/13 1138  BP: 160/142 160/98 157/83 129/75  Pulse: 79  68 66  Temp: 98.4 F (36.9 C)  98.3 F (36.8 C) 98.3 F (36.8 C)  TempSrc: Oral  Oral Oral  Resp: 18  18 18   Height:      Weight:      SpO2: 93%  91% 95%    Wt Readings from Last 3 Encounters:  06/21/13 86.183 kg (190 lb)  06/20/13 84.823 kg (187 lb)  06/14/13 85.73 kg (189 lb)     Intake/Output Summary (Last 24 hours) at 06/22/13 1221 Last data filed at 06/22/13 0755  Gross per 24 hour  Intake       0 ml  Output   1000 ml  Net  -1000 ml    Exam General: Well developed, well nourished, NAD, appears stated age  HEENT: NCAT, PERRLA, EOMI, Anicteic Sclera, mucous membranes moist. No pharyngeal erythema or exudates  Neck: Supple, no JVD, no masses  Cardiovascular: S1 S2 auscultated, no rubs, murmurs or gallops. Regular rate and rhythm.  Respiratory: Clear to auscultation bilaterally with equal chest rise  Abdomen: Soft, nontender, nondistended, + bowel sounds  Extremities: warm dry without cyanosis clubbing or edema  Neuro: AAOx3, cranial nerves grossly intact. Strength 5/5 in patient's upper and lower extremities bilaterally  Skin: Without rashes exudates or nodules  Psych: Normal affect and demeanor with intact judgement and insight   Data Review   Micro Results No results found for this or any previous visit (from the past 240 hour(s)).  Radiology Reports Ct Angio Head W/cm &/or Wo Cm  05/26/2013   CLINICAL DATA:  Stroke symptoms.  Aphasia beginning at 4:30 p.m.  EXAM: CT ANGIOGRAPHY HEAD AND NECK  TECHNIQUE: Multidetector CT imaging of the head and neck was performed using the standard protocol during bolus administration of intravenous contrast. Multiplanar CT image reconstructions including MIPs were obtained to evaluate the vascular anatomy. Carotid stenosis measurements (when applicable) are obtained utilizing NASCET criteria, using the distal internal carotid diameter as the denominator.  CONTRAST:  80mL OMNIPAQUE IOHEXOL 350 MG/ML SOLN  COMPARISON:  CT head without contrast 05/26/2013.  FINDINGS: CTA HEAD FINDINGS  Atrophy and diffuse white matter disease the similar to the prior exams. No pathologic enhancement is evident. The basal ganglia are intact. The source images demonstrate normal gray-white differentiation. Ventricles are of normal size. No significant extra-axial fluid collection is present.  Dense atherosclerotic calcifications are present within the cavernous  carotid arteries bilaterally without significant stenoses. The ICA termini are within normal limits bilaterally. The A1 and M1 segments are within normal limits. The MCA bifurcations are unremarkable. There is some attenuation of MCA branch vessels bilaterally. The basilar artery is within normal limits. Both posterior cerebral arteries originate from the basilar tip. The PCA branch vessels are within normal limits.  The dural sinuses are patent. The right transverse sinus is dominant.  Review of the MIP images confirms the above findings.  CTA NECK FINDINGS  A standard 3 vessel arch configuration is present. Atherosclerotic calcifications are present in the aortic arch without significant stenosis of the great vessel origins.  The vertebral arteries both originate from the subclavian arteries. The left vertebral artery is the dominant vessel.  There is no significant stenosis of the vertebral arteries in the neck. The right vertebral artery essentially terminates at the PICA ascending only a very small branch to the vertebrobasilar junction. There is some ectasia of the left vertebral artery.  The right common carotid artery demonstrates some atherosclerotic irregularity distally without significant stenosis. The bifurcation is unremarkable. The cervical right ICA demonstrates a focal 50% stenosis in the mid cervical ICA. The distal ICA is unremarkable.  The left common carotid artery demonstrates more calcification distally without a significant stenosis. The bifurcation demonstrates no focal stenosis. The left cervical ICA is unremarkable otherwise.  Of note, there is significant medial deviation of the carotid arteries bilaterally focused at the level of the bifurcations.  And advanced spondylosis of the cervical spine is evident. There is chronic loss of disc height in kyphosis C4-5, C5-6, and C6-7. Degenerative grade 1/2 anterolisthesis at C3-4 measures 5 mm.  The lung apices demonstrate biapical scarring. Mild  edema is suggested.  Review of the MIP images confirms the above findings.  IMPRESSION: CTA HEAD IMPRESSION  1. Atrophy and diffuse white matter disease without evidence for an acute infarct. 2. Atherosclerotic changes of the cavernous carotid arteries without significant stenoses.  CTA NECK IMPRESSION  1. Atherosclerotic irregularity within the distal common carotid arteries bilaterally with medial deviation. 2. Focal 50% stenosis of the mid right cervical ICA without other significant stenoses. 3. Mild ectasia of the dominant left vertebral artery.   Electronically Signed   By: Gennette Pac M.D.   On: 05/26/2013 20:12   Dg Chest 2 View  06/08/2013   CLINICAL DATA:  History of stroke  EXAM: CHEST  2 VIEW  COMPARISON:  Chest x-ray of 03/27/2012 this Port-A-Cath appears to have been removed. Aeration has improved with resolution of the opacity in the periphery  FINDINGS: The heart size and mediastinal contours are within normal limits. No active infiltrate or effusion is seen. There is mild peribronchial thickening present. The Port-A-Cath has been removed. Mild cardiomegaly is stable. Mild degenerative changes present throughout the thoracic spine  IMPRESSION: No active lung disease. Mild peribronchial thickening. Stable cardiomegaly.   Electronically Signed   By: Dwyane Dee M.D.   On: 06/08/2013 07:59   Ct Head Wo Contrast  06/21/2013   ADDENDUM REPORT: 06/21/2013 13:16  ADDENDUM: Critical Value/emergent results were called by telephone at the time of interpretation on 06/21/2013 at 1:16 PM to Dr.Camilo, who verbally acknowledged these results.   Electronically Signed   By: Marlan Palau M.D.   On: 06/21/2013 13:16   06/21/2013   CLINICAL DATA:  Code stroke  EXAM: CT HEAD WITHOUT CONTRAST  TECHNIQUE: Contiguous axial images were obtained from the base of the skull through the vertex without intravenous contrast.  COMPARISON:  CT 06/16/2013  FINDINGS: Generalized atrophy. Chronic microvascular ischemic  changes in the white matter. Chronic infarct in the deep white matter on the left is unchanged.  Negative for acute infarct. Negative for hemorrhage or mass lesion. Negative for skull lesion.  IMPRESSION: Chronic ischemic changes are stable from the prior CT. No acute abnormality.  Electronically Signed: By: Marlan Palau M.D. On: 06/21/2013 13:14   Ct Head Wo Contrast  06/16/2013   CLINICAL DATA:  Code stroke, left-sided weakness  EXAM: CT HEAD WITHOUT CONTRAST  TECHNIQUE: Contiguous axial images were obtained from the base of the skull through the vertex without intravenous contrast.  COMPARISON:  06/07/2013  FINDINGS: No skull fracture is noted. Atherosclerotic calcifications of carotid siphon  again noted.  No intracranial hemorrhage, mass effect or midline shift. No definite acute cortical infarction. Stable cerebral atrophy. Again noted periventricular and subcortical white matter decreased attenuation consistent with chronic small vessel ischemic changes. Stable small lacunar infarct left lentiform nucleus and coronal radiata.  IMPRESSION: No intracranial hemorrhage, mass effect or midline shift. No definite acute cortical infarction. Stable cerebral atrophy. Again noted periventricular and subcortical white matter decreased attenuation consistent with chronic small vessel ischemic changes. Stable small lacunar infarct left lentiform nucleus and coronal radiata.   Electronically Signed   By: Natasha Mead M.D.   On: 06/16/2013 09:10   Ct Head (brain) Wo Contrast  06/07/2013   CLINICAL DATA:  Dysphasia. Nausea  EXAM: CT HEAD WITHOUT CONTRAST  TECHNIQUE: Contiguous axial images were obtained from the base of the skull through the vertex without intravenous contrast.  COMPARISON:  CT 05/31/2013  FINDINGS: Generalized atrophy. Moderate chronic microvascular ischemic change in the white matter. Negative for acute infarct. Negative for hemorrhage or mass lesion. Ventricle size is normal. No shift of the midline  structures. Calvarium is intact.  IMPRESSION: Chronic microvascular ischemic change. No acute abnormality.   Electronically Signed   By: Marlan Palau M.D.   On: 06/07/2013 10:17   Ct Head (brain) Wo Contrast  05/31/2013   CLINICAL DATA:  Code stroke.  Difficulty speaking  EXAM: CT HEAD WITHOUT CONTRAST  TECHNIQUE: Contiguous axial images were obtained from the base of the skull through the vertex without intravenous contrast.  COMPARISON:  05/27/2013  FINDINGS: There is patchy diffuse low-attenuation within the subcortical and periventricular white matter compatible with chronic microvascular disease.  There is prominence of the sulci and ventricles consistent with brain atrophy. Evolutionary changes of subacute infarct within the left lentiform nucleus and coronal radiata is again noted.  No midline shift, ventriculomegaly, mass effect, evidence of mass lesion, intracranial hemorrhage or evidence of cortically based acute infarction. Gray-white matter differentiation is within normal limits throughout the brain.  The paranasal sinuses and mastoid air cells are clear. The skull is intact.  IMPRESSION: 1. Subacute infarct involving the left lentiform nucleus and coronal radiata. No acute intracranial abnormalities noted.  2. Chronic small vessel ischemic disease and brain atrophy.   Electronically Signed   By: Signa Kell M.D.   On: 05/31/2013 16:43   Ct Head Wo Contrast  05/26/2013   CLINICAL DATA:  A CHF began at 1630 hr today, history of stroke  EXAM: CT HEAD WITHOUT CONTRAST  TECHNIQUE: Contiguous axial images were obtained from the base of the skull through the vertex without intravenous contrast.  COMPARISON:  05/24/2013  FINDINGS: There is no hemorrhage or extra-axial fluid. There is no evidence of mass or hydrocephalus. There is mild to moderate atrophy and there is moderate diffuse bilateral deep white matter low attenuation. Lacunar infarct left basal ganglia measuring about 1 cm, stable. No  acute vascular territory infarct. Calvarium intact.  IMPRESSION: No acute findings. Stable involutional change and left lacunar infarct.   Electronically Signed   By: Esperanza Heir M.D.   On: 05/26/2013 18:05   Ct Head Wo Contrast  05/24/2013   CLINICAL DATA:  Altered mental status ; tingling in both upper extremities  EXAM: CT HEAD WITHOUT CONTRAST  TECHNIQUE: Contiguous axial images were obtained from the base of the skull through the vertex without intravenous contrast. Study was obtained within 24 hr of patient's arrival at the emergency department.  COMPARISON:  Brain CT June 09, 2008 and brain MRI  October 20, 2008  FINDINGS: There is mild diffuse atrophy. There is no mass, hemorrhage, extra-axial fluid collection, or midline shift. There is small vessel disease throughout the centra semiovale bilaterally. There is evidence of a prior small infarct in the left external capsule. There is no acute appearing infarct.  Bony calvarium appears intact. The mastoid air cells are clear.  IMPRESSION: Atrophy with small vessel disease. Prior infarct left external capsule. No intracranial mass, hemorrhage, or acute appearing infarct.   Electronically Signed   By: Bretta Bang M.D.   On: 05/24/2013 18:38   [name] Head Wo Contrast  06/07/2013   CLINICAL DATA:  Recurrent aphasia. Recent infarct. Patient is anticoagulated.  EXAM: MRI HEAD WITHOUT CONTRAST  MRA HEAD WITHOUT CONTRAST  TECHNIQUE: Multiplanar, multiecho pulse sequences of the brain and surrounding structures were obtained without intravenous contrast. Angiographic images of the head were obtained using MRA technique without contrast.  COMPARISON:  MR brain 05/31/2013. MR brain 05/27/2013. CT head earlier today.  FINDINGS: MRI HEAD FINDINGS  Pseudonormalization on DWI series of the more acute punctate nonhemorrhagic infarct (demonstrated on MR 05/31/2013) involving the posterior left coronal radiata. The older subacute infarct of the left lentiform  nucleus and anterior coronal radiata shows typical evolutionary change with "normalized" peripheral hyperintensity on DWI sequence but some central restricted diffusion. No intervening hemorrhage within the infarct.  There is a subcentimeter focus of T1 shortening in the posterior putamen on the left. This could be located at the very lower most extent of the infarct based on prior images showing the area of acute restricted diffusion at its largest. I cannot exclude a tiny focus of subacute microhemorrhage in the left putamen as this area has evolved compared with 10/25 and 10/29. This area is not visible on today's CT scan.  No new infarcts. Continued atrophy with chronic microvascular ischemic change.  MRA HEAD FINDINGS  Widely patent internal carotid arteries. Basilar artery widely patent with left vertebral dominant. Right vertebral maintains a hypoplastic connection to the basilar and appears to primarily supply the PICA. There is no proximal intracranial stenosis or aneurysm.  IMPRESSION: No new infarction is evident. Typical evolutionary change of the previously identified left deep white matter and basal ganglia infarcts.  There is a subcentimeter focus of T1 shortening, possible subacute hemorrhage, in the left posterior putamen marginally adjacent to the area of previous acute infarction displayed on 05/27/2013. Significance uncertain.  Unremarkable MRA intracranial circulation.   Electronically Signed   By: Davonna Belling M.D.   On: 06/07/2013 15:44   Mri Brain Without Contrast  06/22/2013   CLINICAL DATA:  79 year old female with history of stroke and frequent TIAs. Recent seizure, abnormal speech. Garbled speech. Initial encounter. History of atrial fibrillation.  EXAM: MRI HEAD WITHOUT CONTRAST  MRA HEAD WITHOUT CONTRAST  TECHNIQUE: Multiplanar, multiecho pulse sequences of the brain and surrounding structures were obtained without intravenous contrast. Angiographic images of the head were obtained  using MRA technique without contrast.  COMPARISON:  06/07/2013 and earlier.  FINDINGS: MRI HEAD FINDINGS  Recent left corona radiata lacunar infarct with expected evolution on diffusion weighted imaging and other sequences. However, there are 2 new punctate areas of restricted diffusion in each hemisphere at the level of the centrum semiovale (right side series 5, image 23, left side image 22). No associated mass effect or hemorrhage. No other acute diffusion abnormality identified.  Major intracranial vascular flow voids are stable.  Otherwise stable brain with confluent cerebral white matter T2 and  FLAIR hyperintensity. No midline shift, mass effect, or evidence of intracranial mass lesion. No acute intracranial hemorrhage identified. Negative pituitary, cervicomedullary junction and stable visualized cervical spine. Normal bone marrow signal. Stable orbits soft tissues, paranasal sinuses, mastoids and scalp soft tissues.  MRA HEAD FINDINGS  Stable antegrade flow in the posterior circulation with dominant and somewhat dolichoectatic distal left vertebral artery. PICA origins remain patent. Basilar artery and AICA origins remain patent without stenosis. SCA and PCA origins are stable and within normal limits. Bilateral PCA branches are stable and within normal limits. Posterior communicating arteries are diminutive or absent as before.  Stable antegrade flow in both ICA siphons. Stable Mild ICA irregularity without stenosis. Ophthalmic artery origins are stable and normal. Carotid termini are stable and normal. MCA and ACA origins are stable and normal. Diminutive anterior communicating artery and visualized ACA branches remain within normal limits. Visualized bilateral MCA branches are stable and within normal limits.  IMPRESSION: 1. Expected evolution of the recent left corona radiata lacunar infarct, but now with 2 new punctate areas of restricted diffusion in the hemispheric white matter, 1 in each hemisphere,  compatible with acute lacunar infarcts. Given the history of atrial fibrillation this suggests a recent mild embolic event.  2. No mass effect or acute hemorrhage and otherwise stable MRI appearance of the brain.  3. Stable and negative for age intracranial MRA.   Electronically Signed   By: Augusto Gamble M.D.   On: 06/22/2013 09:05   Mr Brain Wo Contrast  06/07/2013   CLINICAL DATA:  Recurrent aphasia. Recent infarct. Patient is anticoagulated.  EXAM: MRI HEAD WITHOUT CONTRAST  MRA HEAD WITHOUT CONTRAST  TECHNIQUE: Multiplanar, multiecho pulse sequences of the brain and surrounding structures were obtained without intravenous contrast. Angiographic images of the head were obtained using MRA technique without contrast.  COMPARISON:  MR brain 05/31/2013. MR brain 05/27/2013. CT head earlier today.  FINDINGS: MRI HEAD FINDINGS  Pseudonormalization on DWI series of the more acute punctate nonhemorrhagic infarct (demonstrated on MR 05/31/2013) involving the posterior left coronal radiata. The older subacute infarct of the left lentiform nucleus and anterior coronal radiata shows typical evolutionary change with "normalized" peripheral hyperintensity on DWI sequence but some central restricted diffusion. No intervening hemorrhage within the infarct.  There is a subcentimeter focus of T1 shortening in the posterior putamen on the left. This could be located at the very lower most extent of the infarct based on prior images showing the area of acute restricted diffusion at its largest. I cannot exclude a tiny focus of subacute microhemorrhage in the left putamen as this area has evolved compared with 10/25 and 10/29. This area is not visible on today's CT scan.  No new infarcts. Continued atrophy with chronic microvascular ischemic change.  MRA HEAD FINDINGS  Widely patent internal carotid arteries. Basilar artery widely patent with left vertebral dominant. Right vertebral maintains a hypoplastic connection to the basilar  and appears to primarily supply the PICA. There is no proximal intracranial stenosis or aneurysm.  IMPRESSION: No new infarction is evident. Typical evolutionary change of the previously identified left deep white matter and basal ganglia infarcts.  There is a subcentimeter focus of T1 shortening, possible subacute hemorrhage, in the left posterior putamen marginally adjacent to the area of previous acute infarction displayed on 05/27/2013. Significance uncertain.  Unremarkable MRA intracranial circulation.   Electronically Signed   By: Davonna Belling M.D.   On: 06/07/2013 15:44   Mr Brain Wo Contrast  05/31/2013  CLINICAL DATA:  Code stroke. Recent infarcts 4 days ago. New onset of slurred speech today. Right lower extremity weakness and heaviness.  EXAM: MRI HEAD WITHOUT CONTRAST  TECHNIQUE: Multiplanar, multisequence MR imaging was performed. No intravenous contrast was administered.  COMPARISON:  MRI brain 05/27/2013  FINDINGS: The previously-seen acute infarct involving the right lentiform nucleus and coronal radiata is stable. There is a new punctate infarct involving the more posterior left coronal radiata. Atrophy and diffuse white matter disease is otherwise stable. No hemorrhage or mass lesion is present. The ventricles are proportionate to the degree of atrophy. Flow is present in the major intracranial arteries. The patient is status post bilateral lens extractions. The paranasal sinuses and mastoid air cells are clear. The degenerative changes of the upper cervical spine are again noted.  IMPRESSION: 1. New punctate nonhemorrhagic infarct involving the posterior left coronal radiata. 2. The acute infarct of the left lentiform nucleus and coronal radiata is otherwise stable. 3. Atrophy and diffuse white matter disease is also stable. 4. Moderate cervical spondylosis.   Electronically Signed   By: Gennette Pac M.D.   On: 05/31/2013 20:21   Mr Brain Wo Contrast  05/27/2013   *RADIOLOGY REPORT*   Clinical Data: Aphasic.  Stroke.  MRI HEAD WITHOUT CONTRAST  Technique:  Multiplanar, multiecho pulse sequences of the brain and surrounding structures were obtained according to standard protocol without intravenous contrast.  Comparison: CTA head without contrast 05/26/2013  Findings: The diffusion weighted images demonstrate no acute non hemorrhagic infarct within the left lentiform nucleus and corona radiata.  No hemorrhage or mass lesion is present.  The ventricles are proportionate to the degree of atrophy.  Diffuse remote white matter disease is present.  Moderate generalized atrophy is evident.  Remote lacunar infarcts dilated perivascular spaces are present bilaterally.  Flow is present in the major intracranial arteries.  IMPRESSION:  1.  Acute non hemorrhagic infarct of the left lentiform nucleus and corona radiata measures 13 mm maximally. 2.  Atrophy and diffuse white matter disease compatible with chronic microvascular ischemia.   Original Report Authenticated By: Marin Roberts, M.D.   Mr Mra Head/brain Wo Cm  06/22/2013   CLINICAL DATA:  79 year old female with history of stroke and frequent TIAs. Recent seizure, abnormal speech. Garbled speech. Initial encounter. History of atrial fibrillation.  EXAM: MRI HEAD WITHOUT CONTRAST  MRA HEAD WITHOUT CONTRAST  TECHNIQUE: Multiplanar, multiecho pulse sequences of the brain and surrounding structures were obtained without intravenous contrast. Angiographic images of the head were obtained using MRA technique without contrast.  COMPARISON:  06/07/2013 and earlier.  FINDINGS: MRI HEAD FINDINGS  Recent left corona radiata lacunar infarct with expected evolution on diffusion weighted imaging and other sequences. However, there are 2 new punctate areas of restricted diffusion in each hemisphere at the level of the centrum semiovale (right side series 5, image 23, left side image 22). No associated mass effect or hemorrhage. No other acute diffusion  abnormality identified.  Major intracranial vascular flow voids are stable.  Otherwise stable brain with confluent cerebral white matter T2 and FLAIR hyperintensity. No midline shift, mass effect, or evidence of intracranial mass lesion. No acute intracranial hemorrhage identified. Negative pituitary, cervicomedullary junction and stable visualized cervical spine. Normal bone marrow signal. Stable orbits soft tissues, paranasal sinuses, mastoids and scalp soft tissues.  MRA HEAD FINDINGS  Stable antegrade flow in the posterior circulation with dominant and somewhat dolichoectatic distal left vertebral artery. PICA origins remain patent. Basilar artery and AICA origins remain  patent without stenosis. SCA and PCA origins are stable and within normal limits. Bilateral PCA branches are stable and within normal limits. Posterior communicating arteries are diminutive or absent as before.  Stable antegrade flow in both ICA siphons. Stable Mild ICA irregularity without stenosis. Ophthalmic artery origins are stable and normal. Carotid termini are stable and normal. MCA and ACA origins are stable and normal. Diminutive anterior communicating artery and visualized ACA branches remain within normal limits. Visualized bilateral MCA branches are stable and within normal limits.  IMPRESSION: 1. Expected evolution of the recent left corona radiata lacunar infarct, but now with 2 new punctate areas of restricted diffusion in the hemispheric white matter, 1 in each hemisphere, compatible with acute lacunar infarcts. Given the history of atrial fibrillation this suggests a recent mild embolic event.  2. No mass effect or acute hemorrhage and otherwise stable MRI appearance of the brain.  3. Stable and negative for age intracranial MRA.   Electronically Signed   By: Augusto Gamble M.D.   On: 06/22/2013 09:05    CBC  Recent Labs Lab 06/16/13 0857 06/16/13 0906 06/21/13 1305 06/21/13 1335  WBC 3.9*  --  5.5  --   HGB 13.4 13.3  14.2 13.9  HCT 40.9 39.0 42.9 41.0  PLT 177  --  177  --   MCV 89.9  --  89.6  --   MCH 29.5  --  29.6  --   MCHC 32.8  --  33.1  --   RDW 13.9  --  13.7  --   LYMPHSABS 1.1  --  1.4  --   MONOABS 0.3  --  0.6  --   EOSABS 0.2  --  0.3  --   BASOSABS 0.0  --  0.0  --     Chemistries   Recent Labs Lab 06/16/13 0857 06/16/13 0906 06/21/13 1305 06/21/13 1335  NA 140 142 140 143  K 3.8 3.7 4.2 4.1  CL 104 104 102 103  CO2 24  --  26  --   GLUCOSE 138* 140* 102* 105*  BUN 13 12 12 12   CREATININE 1.13* 1.40* 1.03 1.30*  CALCIUM 9.6  --  10.3  --   AST 26  --  30  --   ALT 23  --  25  --   ALKPHOS 64  --  75  --   BILITOT 0.7  --  0.6  --    ------------------------------------------------------------------------------------------------------------------ estimated creatinine clearance is 34.1 ml/min (by C-G formula based on Cr of 1.3). ------------------------------------------------------------------------------------------------------------------ No results found for this basename: HGBA1C,  in the last 72 hours ------------------------------------------------------------------------------------------------------------------ No results found for this basename: CHOL, HDL, LDLCALC, TRIG, CHOLHDL, LDLDIRECT,  in the last 72 hours ------------------------------------------------------------------------------------------------------------------ No results found for this basename: TSH, T4TOTAL, FREET3, T3FREE, THYROIDAB,  in the last 72 hours ------------------------------------------------------------------------------------------------------------------ No results found for this basename: VITAMINB12, FOLATE, FERRITIN, TIBC, IRON, RETICCTPCT,  in the last 72 hours  Coagulation profile  Recent Labs Lab 06/16/13 0857 06/21/13 1305  INR 1.14 1.03    No results found for this basename: DDIMER,  in the last 72 hours  Cardiac Enzymes  Recent Labs Lab 06/16/13 0857   TROPONINI <0.30   ------------------------------------------------------------------------------------------------------------------ No components found with this basename: POCBNP,

## 2013-06-22 NOTE — Progress Notes (Signed)
Pt. Is presenting expressive and receptive aphasia.  Speech is trying to evaluate, however, patient not being cooperative.  MD. Notified no orders given at this time.  Will continue to monitor patient.  Call bell placed within reach of patient.

## 2013-06-22 NOTE — Progress Notes (Signed)
Stroke Team Progress Note  HISTORY [name] is an 79 y.o. 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  She was last known well at 1030 am today when she called her husband and couldn't speak fluently. According to family, she was not able to talk, understand, or read. However, they didn't notice face weakness, focal weakness, and no reported HA, vertigo, double vision, imbalance, or falls.  She is on apixaban.  NIHSS 3.  CT brain showed no acute abnormality.  Patient was seen at [facility] ED 06/16/13 with possible seizure.   Date last known well:  Time last known well:  tPA Given: no, patient on apixaban.  NIHSS: 3  MRS: 1     She was admitted for further evaluation and treatment.  SUBJECTIVE Her husband is at the bedside.  Overall she feels her condition is completely resolved.   OBJECTIVE Most recent Vital Signs: Filed Vitals:   06/22/13 0130 06/22/13 0330 06/22/13 0341 06/22/13 0514  BP: 187/86 160/142 160/98 157/83  Pulse: 74 79  68  Temp: 97.9 F (36.6 C) 98.4 F (36.9 C)  98.3 F (36.8 C)  TempSrc: Oral Oral  Oral  Resp: 18 18  18   Height:      Weight:      SpO2: 94% 93%  91%   CBG (last 3)   Recent Labs  06/21/13 1649 06/21/13 2314 06/22/13 0637  GLUCAP 82 112* 92    IV Fluid Intake:   . sodium chloride 75 mL/hr at 06/22/13 0050    MEDICATIONS  . apixaban  2.5 mg Oral BID  . bisoprolol  5 mg Oral Q1200  . DULoxetine  30 mg Oral Daily  . levETIRAcetam  1,000 mg Oral BID  . neomycin-polymyxin b-dexamethasone  1 drop Left Eye Q6H   PRN:  acetaminophen, ALPRAZolam  Diet:  Cardiac thin liquids Activity:  Bedrest  DVT Prophylaxis:  xarelto  CLINICALLY SIGNIFICANT STUDIES Basic Metabolic Panel:  Recent Labs Lab 06/16/13 0857   06/21/13 1305 06/21/13 1335  NA 140  < > 140 143  K 3.8  < > 4.2 4.1  CL 104  < > 102 103  CO2 24  --  26  --   GLUCOSE 138*  < > 102* 105*  BUN 13  < > 12 12  CREATININE 1.13*  < > 1.03 1.30*  CALCIUM 9.6  --  10.3  --   < > = values in this interval not displayed. Liver Function Tests:  Recent Labs Lab 06/16/13 0857 06/21/13 1305  AST 26 30  ALT 23 25  ALKPHOS 64 75  BILITOT 0.7 0.6  PROT 7.1 8.0  ALBUMIN 3.9 4.3   CBC:  Recent Labs Lab 06/16/13 0857  06/21/13 1305 06/21/13 1335  WBC 3.9*  --  5.5  --   NEUTROABS 2.4  --  3.2  --   HGB 13.4  < > 14.2 13.9  HCT 40.9  < > 42.9 41.0  MCV 89.9  --  89.6  --   PLT 177  --  177  --   < > = values in this interval not displayed. Coagulation:  Recent Labs Lab 06/16/13 0857 06/21/13 1305  LABPROT 14.4 13.3  INR 1.14 1.03   Cardiac Enzymes:  Recent Labs Lab 06/16/13 0857  TROPONINI <  0.30   Urinalysis:  Recent Labs Lab 06/16/13 1228 06/21/13 1339  COLORURINE YELLOW YELLOW  LABSPEC 1.012 1.006  PHURINE 7.0 7.0  GLUCOSEU NEGATIVE NEGATIVE  HGBUR NEGATIVE NEGATIVE  BILIRUBINUR NEGATIVE NEGATIVE  KETONESUR NEGATIVE NEGATIVE  PROTEINUR NEGATIVE NEGATIVE  UROBILINOGEN 0.2 0.2  NITRITE NEGATIVE NEGATIVE  LEUKOCYTESUR TRACE* TRACE*   Lipid Panel    Component Value Date/Time   CHOL 163 05/27/2013 0425   TRIG 176* 05/27/2013 0425   HDL 31* 05/27/2013 0425   CHOLHDL 5.3 05/27/2013 0425   VLDL 35 05/27/2013 0425   LDLCALC 97 05/27/2013 0425   HgbA1C  Lab Results  Component Value Date   HGBA1C 5.9* 05/27/2013    Urine Drug Screen:     Component Value Date/Time   LABOPIA NONE DETECTED 06/21/2013 1339   COCAINSCRNUR NONE DETECTED 06/21/2013 1339   LABBENZ NONE DETECTED 06/21/2013 1339   AMPHETMU NONE DETECTED 06/21/2013 1339   THCU NONE DETECTED 06/21/2013 1339   LABBARB NONE DETECTED 06/21/2013 1339    Alcohol Level:  Recent Labs Lab 06/16/13 0857 06/21/13 1305  ETH <11 <11    Ct Head Wo  Contrast 06/21/2013  : Chronic ischemic changes are stable from the prior CT. No acute abnormality.  Mri Brain Without Contrast 06/22/2013   Recent left corona radiata lacunar infarct with expected evolution on diffusion weighted imaging and other sequences. However, there are 2 new punctate areas of restricted diffusion in each hemisphere at the level of the centrum semiovale (right side series 5, image 23, left side image 22). No associated mass effect or hemorrhage. No other acute diffusion abnormality identified.  Major intracranial vascular flow voids are stable.  Otherwise stable brain with confluent cerebral white matter T2 and FLAIR hyperintensity. No midline shift, mass effect, or evidence of intracranial mass lesion. No acute intracranial hemorrhage identified. Negative pituitary, cervicomedullary junction and stable visualized cervical spine. Normal bone marrow signal. Stable orbits soft tissues, paranasal sinuses, mastoids and scalp soft tissues.   Mr [name] Head/brain Wo Cm 06/22/2013  1. Expected evolution of the recent left corona radiata lacunar infarct, but now with 2 new punctate areas of restricted diffusion in the hemispheric white matter, 1 in each hemisphere, compatible with acute lacunar infarcts. Given the history of atrial fibrillation this suggests a recent mild embolic event.  2. No mass effect or acute hemorrhage and otherwise stable MRI appearance of the brain.  3. Stable and negative for age intracranial MRA.   EEG: This is a normal awake and asleep EEG  2D Echocardiogram   EF 50%, no cardiac source of embolism identified.  Carotid Doppler   Right: mild mixed plaque distal CCA and origin ICA. Left: mild calcific plaque distal CCA. Mild to moderate calcific plaque origin and proximal ICA. RICA 1-39% ICA stenosis. LICA 20-49% stenosis.Vertebral artery flow is antegrade.  CXR    EKG  normal sinus rhythm.   Therapy Recommendations   Physical Exam   Mental Status:   Alert, oriented, thought content appropriate. Mild expressive dysphasia. Able to follow 3 step commands without difficulty.  Cranial Nerves:  II: Discs flat bilaterally; Visual fields grossly normal, pupils equal, round, reactive to light and accommodation  III,IV, VI: ptosis not present, extra-ocular motions intact bilaterally  V,VII: smile symmetric, facial light touch sensation normal bilaterally  VIII: hearing normal bilaterally  IX,X: gag reflex present  XI: bilateral shoulder shrug  XII: midline tongue extension without atrophy or fasciculations  Motor:  Right : Upper extremity 5/5 Left: Upper extremity  5/5  Lower extremity 5/5 Lower extremity 5/5  Tone and bulk:normal tone throughout; no atrophy noted  Sensory: Pinprick and light touch intact throughout, bilaterally  Deep Tendon Reflexes:  Right: Upper Extremity Left: Upper extremity  biceps (C-5 to C-6) 2/4 biceps (C-5 to C-6) 2/4  tricep (C7) 2/4 triceps (C7) 2/4  Brachioradialis (C6) 2/4 Brachioradialis (C6) 2/4  Lower Extremity Lower Extremity  quadriceps (L-2 to L-4) 2/4 quadriceps (L-2 to L-4) 2/4  Achilles (S1) 2/4 Achilles (S1) 2/4  Plantars:  Right: downgoing Left: downgoing  Cerebellar:  normal finger-to-nose, normal heel-to-shin test  Gait:  No ataxia.  CV: pulses palpable throughout    ASSESSMENT Ms. LAEL WETHERBEE is a 79 y.o. female presenting with dysarthria/dysphagia. Imaging confirms two small acute bilateral lacunar infarcts. Infarct felt to be embolic secondary to unknown source.  On eilquis 2.5mg  twice a day prior to admission. Now on eliquis 2.5mg  twice a day for secondary stroke prevention. Patient with resultant transient ability of aphasia. The family was unable to talk, understand or read. She does have a new history of seizure as well. Work up underway.   Prior stroke/transient ischemic attack  Peripheral vascular disease  Hyperlipidemia, LDL 97, add statin as patient wants aggressive  treatment.  Hypertension  Coronary artery disease  History of laryngeal carcinoma (s/p resection)  Lympoma, mantle cell  Hospital day # 1  TREATMENT/PLAN  Change to xarelto 15 mg daily for secondary stroke prevention.  Patient will be changed to keppra 750mg  twice daily as husband states that patient was on 250mg  tablets, 2 tablets twice a day prior to admission.  Add statin  Therapy evaluations  Have patient follow up with Dr. Pearlean Brownie in stroke clinic in 2 months.  Gwendolyn Lima. Manson Passey, [facility], MBA, MHA [facility] Pager: [phone] 06/22/2013 1:21 PM  I have personally obtained a history, examined the patient, evaluated imaging results, and formulated the assessment and plan of care. I agree with the above.

## 2013-06-22 NOTE — Progress Notes (Signed)
UR completed.  Patient changed to IP status r/t new infarcts on MRI.

## 2013-06-22 NOTE — Procedures (Signed)
EEG report.  Brief clinical history: 79 years old female with prior stroke and seizures on keppra, admitted to [facility] due to language impairment.  Technique: this is a 17 channel routine scalp EEG performed at the bedside with bipolar and monopolar montages arranged in accordance to the international 10/20 system of electrode placement. One channel was dedicated to EKG recording.  The study was performed during wakefulness, drowsiness, and stage 2 sleep. No activating procedures performed.  Description:In the wakeful state, the best background consisted of a medium amplitude, posterior dominant, poorly sustained, symmetric and reactive 9 Hz rhythm.. Drowsiness demonstrated dropout of the alpha rhythm. Stage 2 sleep showed symmetric and synchronous sleep spindles without intermixed epileptiform discharges. No focal or generalized epileptiform discharges noted.  No slowing seen.  EKG showed sinus rhythm.  Impression: this is a normal awake and asleep EEG. Please, be aware that a normal EEG does not exclude the possibility of epilepsy.  Clinical correlation is advised.  Wyatt Portela, MD

## 2013-06-22 NOTE — Progress Notes (Signed)
Pt is picked up for the MRI.

## 2013-06-22 NOTE — Progress Notes (Signed)
Pt is back in the unit.

## 2013-06-22 NOTE — Progress Notes (Signed)
EEG completed; results pending.    

## 2013-06-23 DIAGNOSIS — F411 Generalized anxiety disorder: Secondary | ICD-10-CM

## 2013-06-23 DIAGNOSIS — D649 Anemia, unspecified: Secondary | ICD-10-CM

## 2013-06-23 LAB — RAPID URINE DRUG SCREEN, HOSP PERFORMED
Amphetamines: NOT DETECTED
Barbiturates: NOT DETECTED
Cocaine: NOT DETECTED
Opiates: NOT DETECTED
Tetrahydrocannabinol: NOT DETECTED

## 2013-06-23 LAB — GLUCOSE, CAPILLARY: Glucose-Capillary: 96 mg/dL (ref 70–99)

## 2013-06-23 MED ORDER — RIVAROXABAN 15 MG PO TABS: 15.0000 mg | ORAL_TABLET | Freq: Every day | ORAL | Status: DC

## 2013-06-23 MED ORDER — ATORVASTATIN CALCIUM 10 MG PO TABS: 10.0000 mg | ORAL_TABLET | Freq: Every day | ORAL | Status: DC

## 2013-06-23 MED ORDER — LEVETIRACETAM 750 MG PO TABS: 750.0000 mg | ORAL_TABLET | Freq: Two times a day (BID) | ORAL | Status: DC

## 2013-06-23 MED ORDER — LORAZEPAM 2 MG/ML IJ SOLN
INTRAMUSCULAR | Status: AC
  Filled 2013-06-23: qty 1

## 2013-06-23 MED ORDER — LORAZEPAM 2 MG/ML IJ SOLN
0.5000 mg | INTRAMUSCULAR | Status: DC | PRN
  Administered 2013-06-23: 0.5 mg via INTRAVENOUS

## 2013-06-23 NOTE — Discharge Summary (Signed)
Physician Discharge Summary  [name] GNF:[id number] DOB: 1927/08/10 DOA: 06/21/2013  PCP: Lisa Mew, MD  Admit date: 06/21/2013 Discharge date: 06/23/2013  Time spent: 45 minutes  Recommendations for Outpatient Follow-up:  Patient will need to follow up with Dr. Pearlean [name] in 2 months as well as her primary care physician within one week of discharge. This was discussed with the patient's daughter and she does agree. Patient will also need to continue physical therapy as well as occupational and speech therapy. She will have home health. Patient should not drive. Patient should continue taking medications as prescribed.  Discharge Diagnoses:  Principal Problem:   Aphasia Active Problems:   HYPERLIPIDEMIA   ANEMIA-NOS   ANXIETY   DEPRESSION   HYPERTENSION   TIA (transient ischemic attack)   AKI (acute kidney injury)   Focal seizure   Discharge Condition: Stable  Diet recommendation: Heart healthy  Filed Weights   06/21/13 1315  Weight: 86.183 kg (190 lb)    History of present illness:  [name] is a 79 y.o. female  History of stroke and frequent admissions for TIAs, recent seizure, hypertension hyperlipidemia, COPD, atrial fibrillation requiring anticoagulation that presents emergency room with abnormal speech. Patient was seen approximately 10:30 this morning by her husband and was found to be normal at that time. Approximately 30, the husband states that his wife called out to him. After that point patient was no longer able to speak. After approximately 10 minutes, patient did not recognize her family and was only oriented to self. She began having garbled speech which made no sense. Patient's family stated that yesterday and this morning she was normal and having breakfast able to carry out her normal daily activities. Patient has been on Coumadin the past and was started on Pradaxa in approximately one month ago. However due to history of stroke and  several TIAs patient was switched over to Eliquis. At this time, patient is able to speak clearly and understand. She currently has no complaints. She does not remember the events that occurred this morning. Patient was very hesitant regarding being admitted as well as obtaining another MRI. She denies any recent illness, chest pain, shortness of breath, travel.  Hospital Course:  This is a 79 year old female history of strokes frequent admissions for TIAs and recent seizure that was brought to the emergency department by her family due to altered mental status. Patient was admitted for aphasia with TIA rule out CVA.  Neurology was consulted for for further recommendations and intervention. MRI was conducted showing patient had expected evolution of recent left corona radiata lacunar infarct and now with 2 new punctate areas of restricted diffusion in the hemispheric white matter which could be compatible with acute lacunar infarcts. Patient does have a history of atrial fibrillation therefore this event was considered to be an embolic event. Patient has been on Coumadin, Pradaxa, Eliquis this in the past. She was placed on Xarelto during this hospital admission. Patient was also placed on Keppra during her last admission. Her dosage was increased to 750 mg twice daily. Patient did have an EEG was conducted showing a normal awake and asleep EEG.  Echocardiogram was conducted in 05/12/2013 showing an EF of 5055% with a grade 2 diastolic dysfunction no cardiac source of embolism. She also got carotid Doppler conducted October 2014 showing a mixed plaque distal CCA origin ICA, left: Mild calcific plaque distal CCA, mild to moderate calcific origin and proximal ICA, RICA 1-39% ICA stenosis, LICA 20-49% stenosis,  vertebral artery flow is antegrade. Of note patient was found to have an LDL 97. Lipitor was added to her medications. This was discussed with patient's daughter as well as other family members. Patient will  need follow up with her primary care physician within one week of discharge. There is question of possible underlying dementia. This will need to be evaluated by patient's primary care physician. Patient should also follow Dr. Pearlean [name] in 2 months.  This was discussed with the patient and her daughter she does understand and agrees to the plan.  On day of discharge patient was seen and examined and found to be medically stable.  Procedures: EEG Impression: this is a normal awake and asleep EEG. Please, be aware that a normal EEG does not exclude the possibility of epilepsy.   Echo 05/2013: ejection fraction of 50-55%, grade 2 diastolic dysfunction, no cardiac source of embolism was identified.   Carotid Doppler 05/2013  Right: Mixed plaque distal CCA origin ICA, left: Mild calcific plaque distal CCA, mild to moderate calcific origin and proximal ICA, RICA 1-39% ICA stenosis, LICA 20-49% stenosis, vertebral artery flow is antegrade.  Consultations: Neurology  Discharge Exam: Filed Vitals:   06/23/13 1042  BP: 143/77  Pulse: 78  Temp: 98.1 F (36.7 C)  Resp: 20    Exam  General: Well developed, well nourished, NAD, appears stated age  HEENT: NCAT, PERRLA, EOMI, Anicteic Sclera, mucous membranes moist. No pharyngeal erythema or exudates  Neck: Supple, no JVD, no masses  Cardiovascular: S1 S2 auscultated, no rubs, murmurs or gallops. Regular rate and rhythm.  Respiratory: Clear to auscultation bilaterally with equal chest rise  Abdomen: Soft, nontender, nondistended, + bowel sounds  Extremities: warm dry without cyanosis clubbing or edema  Neuro: AAOx3, cranial nerves grossly intact. Strength 5/5 in patient's upper and lower extremities bilaterally  Skin: Without rashes exudates or nodules  Psych: Normal affect and demeanor with intact judgement and insight  Discharge Instructions   Future Appointments Provider Department Dept Phone   06/26/2013 1:15 PM Barron Alvine, CCC-SLP [facility] [phone]   06/26/2013 2:00 PM Clarita Crane, PT [facility] [phone]   06/27/2013 8:00 AM Clarita Crane, PT [facility] [phone]   06/27/2013 8:45 AM Barron Alvine, CCC-SLP [facility] [phone]   07/04/2013 1:15 PM Newell Coral, [place] [facility] [phone]   07/04/2013 2:00 PM Ellen Henri, CCC-SLP [facility] [phone]   07/06/2013 1:15 PM Barron Alvine, CCC-SLP [facility] [phone]   07/06/2013 2:00 PM Clarita Crane, PT [facility] [phone]   07/10/2013 10:00 AM Madelaine Bhat Gus Rankin, DO [facility] Neurology [facility] [phone]   07/11/2013 1:15 PM Ellen Henri, CCC-SLP [facility] [phone]   07/11/2013 2:00 PM Newell Coral, [place] [facility] [phone]   07/13/2013 1:15 PM Newell Coral, [place] [facility] [phone]   07/13/2013 2:00 PM Ellen Henri, CCC-SLP [facility] [phone]   07/18/2013 1:15 PM Ellen Henri, CCC-SLP [facility] [phone]   07/18/2013 2:00 PM Newell Coral, [place] [facility] [phone]   07/20/2013 2:00 PM Newell Coral, [place] [facility] [phone]   07/20/2013 2:45 PM Nona Dell [facility] [phone]   08/10/2013 9:45 AM Rana Snare, NP [facility]  MEDICAL ONCOLOGY [phone]  09/11/2013 11:30 AM Stacie Glaze, MD [facility] [place] [phone]       Medication List    ASK your doctor about these medications       ALPRAZolam 0.5 MG tablet  Commonly known as:  XANAX  Take 1 tablet (0.5 mg total) by mouth at bedtime as needed for sleep.     apixaban 2.5 MG Tabs tablet  Commonly known as:  ELIQUIS  Take 1 tablet (2.5 mg total) by mouth 2 (two) times daily.     bisoprolol 5 MG tablet  Commonly known as:  ZEBETA  Take 5 mg by mouth daily at 12 noon.     diclofenac sodium 1 % Gel  Commonly known as:  VOLTAREN  Apply 2 g topically 4 (four) times daily as needed (pain; apply to hand).     DULoxetine 30 MG capsule  Commonly known as:  CYMBALTA  Take 1 capsule (30 mg total) by mouth daily.     levETIRAcetam 500 MG tablet  Commonly known as:  KEPPRA  Take 1,000 mg by mouth 2 (two) times daily.     loperamide 2 MG capsule  Commonly known as:  IMODIUM  Take 2 mg by mouth daily as needed for diarrhea or loose stools.     midodrine 5 MG tablet  Commonly known as:  PROAMATINE  Take 1 tablet (5 mg total) by mouth 3 (three) times daily with meals.     neomycin-polymyxin b-dexamethasone 3.5-10000-0.1 Susp  Commonly known as:  MAXITROL  Place 1 drop into the left eye every 6 (six) hours.     VITAMIN B12 PO  Take 5,000 mcg by mouth daily.     Vitamin D (Ergocalciferol) 50000 UNITS Caps capsule  Commonly known as:  DRISDOL  Take 50,000 Units by mouth every Sunday.       Allergies  Allergen Reactions  . Propoxyphene Hcl     Rxn: unknown      The results of significant diagnostics from this hospitalization (including imaging, microbiology, ancillary and laboratory) are listed below for reference.    Significant Diagnostic Studies: Ct Angio Head W/cm &/or Wo Cm  05/26/2013   CLINICAL DATA:  Stroke symptoms.  Aphasia beginning at 4:30 p.m.  EXAM: CT ANGIOGRAPHY HEAD AND NECK  TECHNIQUE: Multidetector CT  imaging of the head and neck was performed using the standard protocol during bolus administration of intravenous contrast. Multiplanar CT image reconstructions including MIPs were obtained to evaluate the vascular anatomy. Carotid stenosis measurements (when applicable) are obtained utilizing NASCET criteria, using the distal internal carotid diameter as the denominator.  CONTRAST:  80mL OMNIPAQUE IOHEXOL 350 MG/ML SOLN  COMPARISON:  CT head without contrast 05/26/2013.  FINDINGS: CTA HEAD FINDINGS  Atrophy and diffuse white matter disease the similar to the prior exams. No pathologic enhancement is evident. The basal ganglia are intact. The source images demonstrate normal gray-white differentiation. Ventricles are of normal size. No significant extra-axial fluid collection is present.  Dense atherosclerotic calcifications are present within the cavernous carotid arteries bilaterally without significant stenoses. The ICA termini are within normal limits bilaterally. The A1 and M1 segments are within normal limits. The MCA bifurcations are unremarkable. There is some attenuation of MCA branch vessels bilaterally. The basilar artery is within normal limits. Both posterior cerebral arteries originate from the basilar tip. The PCA branch vessels are within normal limits.  The dural sinuses are patent. The right transverse sinus is dominant.  Review of the MIP images confirms the  above findings.  CTA NECK FINDINGS  A standard 3 vessel arch configuration is present. Atherosclerotic calcifications are present in the aortic arch without significant stenosis of the great vessel origins.  The vertebral arteries both originate from the subclavian arteries. The left vertebral artery is the dominant vessel. There is no significant stenosis of the vertebral arteries in the neck. The right vertebral artery essentially terminates at the PICA ascending only a very small branch to the vertebrobasilar junction. There is some ectasia  of the left vertebral artery.  The right common carotid artery demonstrates some atherosclerotic irregularity distally without significant stenosis. The bifurcation is unremarkable. The cervical right ICA demonstrates a focal 50% stenosis in the mid cervical ICA. The distal ICA is unremarkable.  The left common carotid artery demonstrates more calcification distally without a significant stenosis. The bifurcation demonstrates no focal stenosis. The left cervical ICA is unremarkable otherwise.  Of note, there is significant medial deviation of the carotid arteries bilaterally focused at the level of the bifurcations.  And advanced spondylosis of the cervical spine is evident. There is chronic loss of disc height in kyphosis C4-5, C5-6, and C6-7. Degenerative grade 1/2 anterolisthesis at C3-4 measures 5 mm.  The lung apices demonstrate biapical scarring. Mild edema is suggested.  Review of the MIP images confirms the above findings.  IMPRESSION: CTA HEAD IMPRESSION  1. Atrophy and diffuse white matter disease without evidence for an acute infarct. 2. Atherosclerotic changes of the cavernous carotid arteries without significant stenoses.  CTA NECK IMPRESSION  1. Atherosclerotic irregularity within the distal common carotid arteries bilaterally with medial deviation. 2. Focal 50% stenosis of the mid right cervical ICA without other significant stenoses. 3. Mild ectasia of the dominant left vertebral artery.   Electronically Signed   By: Gennette Pac M.D.   On: 05/26/2013 20:12   Dg Chest 2 View  06/08/2013   CLINICAL DATA:  History of stroke  EXAM: CHEST  2 VIEW  COMPARISON:  Chest x-ray of 03/27/2012 this Port-A-Cath appears to have been removed. Aeration has improved with resolution of the opacity in the periphery  FINDINGS: The heart size and mediastinal contours are within normal limits. No active infiltrate or effusion is seen. There is mild peribronchial thickening present. The Port-A-Cath has been removed.  Mild cardiomegaly is stable. Mild degenerative changes present throughout the thoracic spine  IMPRESSION: No active lung disease. Mild peribronchial thickening. Stable cardiomegaly.   Electronically Signed   By: Dwyane Dee M.D.   On: 06/08/2013 07:59   Ct Head Wo Contrast  06/21/2013   ADDENDUM REPORT: 06/21/2013 13:16  ADDENDUM: Critical Value/emergent results were called by telephone at the time of interpretation on 06/21/2013 at 1:16 PM to Dr.Camilo, who verbally acknowledged these results.   Electronically Signed   By: Marlan Palau M.D.   On: 06/21/2013 13:16   06/21/2013   CLINICAL DATA:  Code stroke  EXAM: CT HEAD WITHOUT CONTRAST  TECHNIQUE: Contiguous axial images were obtained from the base of the skull through the vertex without intravenous contrast.  COMPARISON:  CT 06/16/2013  FINDINGS: Generalized atrophy. Chronic microvascular ischemic changes in the white matter. Chronic infarct in the deep white matter on the left is unchanged.  Negative for acute infarct. Negative for hemorrhage or mass lesion. Negative for skull lesion.  IMPRESSION: Chronic ischemic changes are stable from the prior CT. No acute abnormality.  Electronically Signed: By: Marlan Palau M.D. On: 06/21/2013 13:14   Ct Head Wo Contrast  06/16/2013  CLINICAL DATA:  Code stroke, left-sided weakness  EXAM: CT HEAD WITHOUT CONTRAST  TECHNIQUE: Contiguous axial images were obtained from the base of the skull through the vertex without intravenous contrast.  COMPARISON:  06/07/2013  FINDINGS: No skull fracture is noted. Atherosclerotic calcifications of carotid siphon again noted.  No intracranial hemorrhage, mass effect or midline shift. No definite acute cortical infarction. Stable cerebral atrophy. Again noted periventricular and subcortical white matter decreased attenuation consistent with chronic small vessel ischemic changes. Stable small lacunar infarct left lentiform nucleus and coronal radiata.  IMPRESSION: No  intracranial hemorrhage, mass effect or midline shift. No definite acute cortical infarction. Stable cerebral atrophy. Again noted periventricular and subcortical white matter decreased attenuation consistent with chronic small vessel ischemic changes. Stable small lacunar infarct left lentiform nucleus and coronal radiata.   Electronically Signed   By: Natasha Mead M.D.   On: 06/16/2013 09:10   Ct Head (brain) Wo Contrast  06/07/2013   CLINICAL DATA:  Dysphasia. Nausea  EXAM: CT HEAD WITHOUT CONTRAST  TECHNIQUE: Contiguous axial images were obtained from the base of the skull through the vertex without intravenous contrast.  COMPARISON:  CT 05/31/2013  FINDINGS: Generalized atrophy. Moderate chronic microvascular ischemic change in the white matter. Negative for acute infarct. Negative for hemorrhage or mass lesion. Ventricle size is normal. No shift of the midline structures. Calvarium is intact.  IMPRESSION: Chronic microvascular ischemic change. No acute abnormality.   Electronically Signed   By: Marlan Palau M.D.   On: 06/07/2013 10:17   Ct Head (brain) Wo Contrast  05/31/2013   CLINICAL DATA:  Code stroke.  Difficulty speaking  EXAM: CT HEAD WITHOUT CONTRAST  TECHNIQUE: Contiguous axial images were obtained from the base of the skull through the vertex without intravenous contrast.  COMPARISON:  05/27/2013  FINDINGS: There is patchy diffuse low-attenuation within the subcortical and periventricular white matter compatible with chronic microvascular disease.  There is prominence of the sulci and ventricles consistent with brain atrophy. Evolutionary changes of subacute infarct within the left lentiform nucleus and coronal radiata is again noted.  No midline shift, ventriculomegaly, mass effect, evidence of mass lesion, intracranial hemorrhage or evidence of cortically based acute infarction. Gray-white matter differentiation is within normal limits throughout the brain.  The paranasal sinuses and mastoid  air cells are clear. The skull is intact.  IMPRESSION: 1. Subacute infarct involving the left lentiform nucleus and coronal radiata. No acute intracranial abnormalities noted.  2. Chronic small vessel ischemic disease and brain atrophy.   Electronically Signed   By: Signa Kell M.D.   On: 05/31/2013 16:43   Ct Head Wo Contrast  05/26/2013   CLINICAL DATA:  A CHF began at 1630 hr today, history of stroke  EXAM: CT HEAD WITHOUT CONTRAST  TECHNIQUE: Contiguous axial images were obtained from the base of the skull through the vertex without intravenous contrast.  COMPARISON:  05/24/2013  FINDINGS: There is no hemorrhage or extra-axial fluid. There is no evidence of mass or hydrocephalus. There is mild to moderate atrophy and there is moderate diffuse bilateral deep white matter low attenuation. Lacunar infarct left basal ganglia measuring about 1 cm, stable. No acute vascular territory infarct. Calvarium intact.  IMPRESSION: No acute findings. Stable involutional change and left lacunar infarct.   Electronically Signed   By: Esperanza Heir M.D.   On: 05/26/2013 18:05   Ct Head Wo Contrast  05/24/2013   CLINICAL DATA:  Altered mental status ; tingling in both upper extremities  EXAM: CT HEAD WITHOUT CONTRAST  TECHNIQUE: Contiguous axial images were obtained from the base of the skull through the vertex without intravenous contrast. Study was obtained within 24 hr of patient's arrival at the emergency department.  COMPARISON:  Brain CT June 09, 2008 and brain MRI October 20, 2008  FINDINGS: There is mild diffuse atrophy. There is no mass, hemorrhage, extra-axial fluid collection, or midline shift. There is small vessel disease throughout the centra semiovale bilaterally. There is evidence of a prior small infarct in the left external capsule. There is no acute appearing infarct.  Bony calvarium appears intact. The mastoid air cells are clear.  IMPRESSION: Atrophy with small vessel disease. Prior infarct left  external capsule. No intracranial mass, hemorrhage, or acute appearing infarct.   Electronically Signed   By: Bretta Bang M.D.   On: 05/24/2013 18:38   Ct Angio Neck W/cm &/or Wo/cm  06/22/2013   CLINICAL DATA: Stroke symptoms. Aphasia beginning at 4:30 p.m.  EXAM: CT ANGIOGRAPHY HEAD AND NECK  TECHNIQUE: Multidetector CT imaging of the head and neck was performed using the standard protocol during bolus administration of intravenous contrast. Multiplanar CT image reconstructions including MIPs were obtained to evaluate the vascular anatomy. Carotid stenosis measurements (when applicable) are obtained utilizing NASCET criteria, using the distal internal carotid diameter as the denominator.  CONTRAST: 80mL OMNIPAQUE IOHEXOL 350 MG/ML SOLN  COMPARISON: CT head without contrast 05/26/2013.  FINDINGS: CTA HEAD FINDINGS  Atrophy and diffuse white matter disease the similar to the prior exams. No pathologic enhancement is evident. The basal ganglia are intact. The source images demonstrate normal gray-white differentiation. Ventricles are of normal size. No significant extra-axial fluid collection is present.  Dense atherosclerotic calcifications are present within the cavernous carotid arteries bilaterally without significant stenoses. The ICA termini are within normal limits bilaterally. The A1 and M1 segments are within normal limits. The MCA bifurcations are unremarkable. There is some attenuation of MCA branch vessels bilaterally. The basilar artery is within normal limits. Both posterior cerebral arteries originate from the basilar tip. The PCA branch vessels are within normal limits.  The dural sinuses are patent. The right transverse sinus is dominant.  Review of the MIP images confirms the above findings.  CTA NECK FINDINGS  A standard 3 vessel arch configuration is present. Atherosclerotic calcifications are present in the aortic arch without significant stenosis of the great vessel origins.  The  vertebral arteries both originate from the subclavian arteries. The left vertebral artery is the dominant vessel. There is no significant stenosis of the vertebral arteries in the neck. The right vertebral artery essentially terminates at the PICA ascending only a very small branch to the vertebrobasilar junction. There is some ectasia of the left vertebral artery.  The right common carotid artery demonstrates some atherosclerotic irregularity distally without significant stenosis. The bifurcation is unremarkable. The cervical right ICA demonstrates a focal 50% stenosis in the mid cervical ICA. The distal ICA is unremarkable.  The left common carotid artery demonstrates more calcification distally without a significant stenosis. The bifurcation demonstrates no focal stenosis. The left cervical ICA is unremarkable otherwise.  Of note, there is significant medial deviation of the carotid arteries bilaterally focused at the level of the bifurcations.  And advanced spondylosis of the cervical spine is evident. There is chronic loss of disc height in kyphosis C4-5, C5-6, and C6-7. Degenerative grade 1/2 anterolisthesis at C3-4 measures 5 mm.  The lung apices demonstrate biapical scarring. Mild edema is suggested.  Review of  the MIP images confirms the above findings.  IMPRESSION: CTA HEAD IMPRESSION  1. Atrophy and diffuse white matter disease without evidence for an acute infarct. Atherosclerotic changes of the cavernous carotid arteries without significant stenoses.  CTA NECK IMPRESSION  1. Atherosclerotic irregularity within the distal common carotid arteries bilaterally with medial deviation. Focal 50% stenosis of the mid right cervical ICA without other significant stenoses. Mild ectasia of the dominant left vertebral artery.   Electronically Signed   By: Gennette Pac M.D.   On: 06/22/2013 15:39   [name] Head Wo Contrast  06/07/2013   CLINICAL DATA:  Recurrent aphasia. Recent infarct. Patient is anticoagulated.   EXAM: MRI HEAD WITHOUT CONTRAST  MRA HEAD WITHOUT CONTRAST  TECHNIQUE: Multiplanar, multiecho pulse sequences of the brain and surrounding structures were obtained without intravenous contrast. Angiographic images of the head were obtained using MRA technique without contrast.  COMPARISON:  MR brain 05/31/2013. MR brain 05/27/2013. CT head earlier today.  FINDINGS: MRI HEAD FINDINGS  Pseudonormalization on DWI series of the more acute punctate nonhemorrhagic infarct (demonstrated on MR 05/31/2013) involving the posterior left coronal radiata. The older subacute infarct of the left lentiform nucleus and anterior coronal radiata shows typical evolutionary change with "normalized" peripheral hyperintensity on DWI sequence but some central restricted diffusion. No intervening hemorrhage within the infarct.  There is a subcentimeter focus of T1 shortening in the posterior putamen on the left. This could be located at the very lower most extent of the infarct based on prior images showing the area of acute restricted diffusion at its largest. I cannot exclude a tiny focus of subacute microhemorrhage in the left putamen as this area has evolved compared with 10/25 and 10/29. This area is not visible on today's CT scan.  No new infarcts. Continued atrophy with chronic microvascular ischemic change.  MRA HEAD FINDINGS  Widely patent internal carotid arteries. Basilar artery widely patent with left vertebral dominant. Right vertebral maintains a hypoplastic connection to the basilar and appears to primarily supply the PICA. There is no proximal intracranial stenosis or aneurysm.  IMPRESSION: No new infarction is evident. Typical evolutionary change of the previously identified left deep white matter and basal ganglia infarcts.  There is a subcentimeter focus of T1 shortening, possible subacute hemorrhage, in the left posterior putamen marginally adjacent to the area of previous acute infarction displayed on 05/27/2013.  Significance uncertain.  Unremarkable MRA intracranial circulation.   Electronically Signed   By: Davonna Belling M.D.   On: 06/07/2013 15:44   Mri Brain Without Contrast  06/22/2013   CLINICAL DATA:  79 year old female with history of stroke and frequent TIAs. Recent seizure, abnormal speech. Garbled speech. Initial encounter. History of atrial fibrillation.  EXAM: MRI HEAD WITHOUT CONTRAST  MRA HEAD WITHOUT CONTRAST  TECHNIQUE: Multiplanar, multiecho pulse sequences of the brain and surrounding structures were obtained without intravenous contrast. Angiographic images of the head were obtained using MRA technique without contrast.  COMPARISON:  06/07/2013 and earlier.  FINDINGS: MRI HEAD FINDINGS  Recent left corona radiata lacunar infarct with expected evolution on diffusion weighted imaging and other sequences. However, there are 2 new punctate areas of restricted diffusion in each hemisphere at the level of the centrum semiovale (right side series 5, image 23, left side image 22). No associated mass effect or hemorrhage. No other acute diffusion abnormality identified.  Major intracranial vascular flow voids are stable.  Otherwise stable brain with confluent cerebral white matter T2 and FLAIR hyperintensity. No midline shift, mass  effect, or evidence of intracranial mass lesion. No acute intracranial hemorrhage identified. Negative pituitary, cervicomedullary junction and stable visualized cervical spine. Normal bone marrow signal. Stable orbits soft tissues, paranasal sinuses, mastoids and scalp soft tissues.  MRA HEAD FINDINGS  Stable antegrade flow in the posterior circulation with dominant and somewhat dolichoectatic distal left vertebral artery. PICA origins remain patent. Basilar artery and AICA origins remain patent without stenosis. SCA and PCA origins are stable and within normal limits. Bilateral PCA branches are stable and within normal limits. Posterior communicating arteries are diminutive or  absent as before.  Stable antegrade flow in both ICA siphons. Stable Mild ICA irregularity without stenosis. Ophthalmic artery origins are stable and normal. Carotid termini are stable and normal. MCA and ACA origins are stable and normal. Diminutive anterior communicating artery and visualized ACA branches remain within normal limits. Visualized bilateral MCA branches are stable and within normal limits.  IMPRESSION: 1. Expected evolution of the recent left corona radiata lacunar infarct, but now with 2 new punctate areas of restricted diffusion in the hemispheric white matter, 1 in each hemisphere, compatible with acute lacunar infarcts. Given the history of atrial fibrillation this suggests a recent mild embolic event.  2. No mass effect or acute hemorrhage and otherwise stable MRI appearance of the brain.  3. Stable and negative for age intracranial MRA.   Electronically Signed   By: Augusto Gamble M.D.   On: 06/22/2013 09:05   Mr Brain Wo Contrast  06/07/2013   CLINICAL DATA:  Recurrent aphasia. Recent infarct. Patient is anticoagulated.  EXAM: MRI HEAD WITHOUT CONTRAST  MRA HEAD WITHOUT CONTRAST  TECHNIQUE: Multiplanar, multiecho pulse sequences of the brain and surrounding structures were obtained without intravenous contrast. Angiographic images of the head were obtained using MRA technique without contrast.  COMPARISON:  MR brain 05/31/2013. MR brain 05/27/2013. CT head earlier today.  FINDINGS: MRI HEAD FINDINGS  Pseudonormalization on DWI series of the more acute punctate nonhemorrhagic infarct (demonstrated on MR 05/31/2013) involving the posterior left coronal radiata. The older subacute infarct of the left lentiform nucleus and anterior coronal radiata shows typical evolutionary change with "normalized" peripheral hyperintensity on DWI sequence but some central restricted diffusion. No intervening hemorrhage within the infarct.  There is a subcentimeter focus of T1 shortening in the posterior putamen on  the left. This could be located at the very lower most extent of the infarct based on prior images showing the area of acute restricted diffusion at its largest. I cannot exclude a tiny focus of subacute microhemorrhage in the left putamen as this area has evolved compared with 10/25 and 10/29. This area is not visible on today's CT scan.  No new infarcts. Continued atrophy with chronic microvascular ischemic change.  MRA HEAD FINDINGS  Widely patent internal carotid arteries. Basilar artery widely patent with left vertebral dominant. Right vertebral maintains a hypoplastic connection to the basilar and appears to primarily supply the PICA. There is no proximal intracranial stenosis or aneurysm.  IMPRESSION: No new infarction is evident. Typical evolutionary change of the previously identified left deep white matter and basal ganglia infarcts.  There is a subcentimeter focus of T1 shortening, possible subacute hemorrhage, in the left posterior putamen marginally adjacent to the area of previous acute infarction displayed on 05/27/2013. Significance uncertain.  Unremarkable MRA intracranial circulation.   Electronically Signed   By: Davonna Belling M.D.   On: 06/07/2013 15:44   Mr Brain Wo Contrast  05/31/2013   CLINICAL DATA:  Code stroke.  Recent infarcts 4 days ago. New onset of slurred speech today. Right lower extremity weakness and heaviness.  EXAM: MRI HEAD WITHOUT CONTRAST  TECHNIQUE: Multiplanar, multisequence MR imaging was performed. No intravenous contrast was administered.  COMPARISON:  MRI brain 05/27/2013  FINDINGS: The previously-seen acute infarct involving the right lentiform nucleus and coronal radiata is stable. There is a new punctate infarct involving the more posterior left coronal radiata. Atrophy and diffuse white matter disease is otherwise stable. No hemorrhage or mass lesion is present. The ventricles are proportionate to the degree of atrophy. Flow is present in the major intracranial  arteries. The patient is status post bilateral lens extractions. The paranasal sinuses and mastoid air cells are clear. The degenerative changes of the upper cervical spine are again noted.  IMPRESSION: 1. New punctate nonhemorrhagic infarct involving the posterior left coronal radiata. 2. The acute infarct of the left lentiform nucleus and coronal radiata is otherwise stable. 3. Atrophy and diffuse white matter disease is also stable. 4. Moderate cervical spondylosis.   Electronically Signed   By: Gennette Pac M.D.   On: 05/31/2013 20:21   Mr Brain Wo Contrast  05/27/2013   *RADIOLOGY REPORT*  Clinical Data: Aphasic.  Stroke.  MRI HEAD WITHOUT CONTRAST  Technique:  Multiplanar, multiecho pulse sequences of the brain and surrounding structures were obtained according to standard protocol without intravenous contrast.  Comparison: CTA head without contrast 05/26/2013  Findings: The diffusion weighted images demonstrate no acute non hemorrhagic infarct within the left lentiform nucleus and corona radiata.  No hemorrhage or mass lesion is present.  The ventricles are proportionate to the degree of atrophy.  Diffuse remote white matter disease is present.  Moderate generalized atrophy is evident.  Remote lacunar infarcts dilated perivascular spaces are present bilaterally.  Flow is present in the major intracranial arteries.  IMPRESSION:  1.  Acute non hemorrhagic infarct of the left lentiform nucleus and corona radiata measures 13 mm maximally. 2.  Atrophy and diffuse white matter disease compatible with chronic microvascular ischemia.   Original Report Authenticated By: Marin Roberts, M.D.   Mr Mra Head/brain Wo Cm  06/22/2013   CLINICAL DATA:  79 year old female with history of stroke and frequent TIAs. Recent seizure, abnormal speech. Garbled speech. Initial encounter. History of atrial fibrillation.  EXAM: MRI HEAD WITHOUT CONTRAST  MRA HEAD WITHOUT CONTRAST  TECHNIQUE: Multiplanar, multiecho pulse  sequences of the brain and surrounding structures were obtained without intravenous contrast. Angiographic images of the head were obtained using MRA technique without contrast.  COMPARISON:  06/07/2013 and earlier.  FINDINGS: MRI HEAD FINDINGS  Recent left corona radiata lacunar infarct with expected evolution on diffusion weighted imaging and other sequences. However, there are 2 new punctate areas of restricted diffusion in each hemisphere at the level of the centrum semiovale (right side series 5, image 23, left side image 22). No associated mass effect or hemorrhage. No other acute diffusion abnormality identified.  Major intracranial vascular flow voids are stable.  Otherwise stable brain with confluent cerebral white matter T2 and FLAIR hyperintensity. No midline shift, mass effect, or evidence of intracranial mass lesion. No acute intracranial hemorrhage identified. Negative pituitary, cervicomedullary junction and stable visualized cervical spine. Normal bone marrow signal. Stable orbits soft tissues, paranasal sinuses, mastoids and scalp soft tissues.  MRA HEAD FINDINGS  Stable antegrade flow in the posterior circulation with dominant and somewhat dolichoectatic distal left vertebral artery. PICA origins remain patent. Basilar artery and AICA origins remain patent without stenosis. SCA and  PCA origins are stable and within normal limits. Bilateral PCA branches are stable and within normal limits. Posterior communicating arteries are diminutive or absent as before.  Stable antegrade flow in both ICA siphons. Stable Mild ICA irregularity without stenosis. Ophthalmic artery origins are stable and normal. Carotid termini are stable and normal. MCA and ACA origins are stable and normal. Diminutive anterior communicating artery and visualized ACA branches remain within normal limits. Visualized bilateral MCA branches are stable and within normal limits.  IMPRESSION: 1. Expected evolution of the recent left  corona radiata lacunar infarct, but now with 2 new punctate areas of restricted diffusion in the hemispheric white matter, 1 in each hemisphere, compatible with acute lacunar infarcts. Given the history of atrial fibrillation this suggests a recent mild embolic event.  2. No mass effect or acute hemorrhage and otherwise stable MRI appearance of the brain.  3. Stable and negative for age intracranial MRA.   Electronically Signed   By: Augusto Gamble M.D.   On: 06/22/2013 09:05    Microbiology: No results found for this or any previous visit (from the past 240 hour(s)).   Labs: Basic Metabolic Panel:  Recent Labs Lab 06/21/13 1305 06/21/13 1335 06/22/13 1425  NA 140 143 139  K 4.2 4.1 3.8  CL 102 103 104  CO2 26  --  24  GLUCOSE 102* 105* 104*  BUN 12 12 10   CREATININE 1.03 1.30* 1.00  CALCIUM 10.3  --  9.5   Liver Function Tests:  Recent Labs Lab 06/21/13 1305  AST 30  ALT 25  ALKPHOS 75  BILITOT 0.6  PROT 8.0  ALBUMIN 4.3   No results found for this basename: LIPASE, AMYLASE,  in the last 168 hours No results found for this basename: AMMONIA,  in the last 168 hours CBC:  Recent Labs Lab 06/21/13 1305 06/21/13 1335  WBC 5.5  --   NEUTROABS 3.2  --   HGB 14.2 13.9  HCT 42.9 41.0  MCV 89.6  --   PLT 177  --    Cardiac Enzymes: No results found for this basename: CKTOTAL, CKMB, CKMBINDEX, TROPONINI,  in the last 168 hours BNP: BNP (last 3 results) No results found for this basename: PROBNP,  in the last 8760 hours CBG:  Recent Labs Lab 06/22/13 0637 06/22/13 1205 06/22/13 1642 06/22/13 2210 06/23/13 0654  GLUCAP 92 90 95 98 91       Signed:  Taite Schoeppner  [facility] 06/23/2013, 11:18 AM

## 2013-06-23 NOTE — Progress Notes (Signed)
Pt. DC'd home via car with husband and daughter.  DC instructions given to both husband and daughter.  Assessments were stable.  Home health was set up for patient prior to DC.

## 2013-06-23 NOTE — Evaluation (Signed)
Occupational Therapy Evaluation Patient Details Name: [name] MRN: [id number] DOB: April 28, 1928 Today's Date: 06/23/2013 Time: 4098-1191 OT Time Calculation (min): 9 min  OT Assessment / Plan / Recommendation History of present illness 79 y.o. female, with recent 2 admissions suspicious for CVA and then TIA, switched from Pradaxa to Eliquis 2 admissions ago recently, severe orthostatic hypotension, peptic ulcer disease, PAD, depression, COPD, hypertension, CAD, DVT in the past, comes in for a another episode of forgetfulness, disoriented behavior and slurred speech which started sometime last night, patient does not recall going to the bed, she did not wear her usual clothes last night and was wearing only a T-shirt when she went to bed which is very unusual for her, husband saw her being extremely anxious the whole night trying to get out of the bed, this morning she was noted wearing some strange clothes and standing in the closet looking lost, she was also found to have slurred speech, and then brought to the ER where she was seen by neurologist, an MRI MRA of the brain was done which showed no new changes, stroke was ruled out and hospitalist service was requested to admit the patient for further workup including workup for seizures   Clinical Impression   Pt admitted with above.  She marginally participated with OT.  She required mod A to move to EOB (resisted movement), was able to sit EOB x 1-2 mins with supervision, stood with min A, then returned herself back to bed with min guard assist for safety.  Pt would not participate in any further activity despite max attempts.  Pt resisted all efforts, and shook head "no".  She did spontaneously answer 3 questions re: PLOF, but then answers became inconsistent, and language at times made no sense.  No family present.  Noted pt with orders for discharge home today.  She will require 24 hour physical assistance as, she appears to be at risk for  fall.  When standing, she demonstrated an anterior lean making no attempt to correct.  Unsure what pt's baseline level of functioning is, and how far off her baseline she is at this time.  Recommend HHOT.    OT Assessment  All further OT needs can be met in the next venue of care    Follow Up Recommendations  Home health OT;Supervision/Assistance - 24 hour    Barriers to Discharge      Equipment Recommendations  None recommended by OT    Recommendations for Other Services    Frequency       Precautions / Restrictions Precautions Precautions: Fall   Pertinent Vitals/Pain     ADL  Eating/Feeding: +1 Total assistance Where Assessed - Eating/Feeding: Bed level Grooming: +1 Total assistance Where Assessed - Grooming: Supine, head of bed up Upper Body Bathing: +1 Total assistance Where Assessed - Upper Body Bathing: Unsupported sitting Lower Body Bathing: +1 Total assistance Where Assessed - Lower Body Bathing: Supported sit to stand Upper Body Dressing: +1 Total assistance Where Assessed - Upper Body Dressing: Supported sitting Lower Body Dressing: +1 Total assistance Where Assessed - Lower Body Dressing: Supported sit to Pharmacist, hospital: Minimal Dentist Method: Sit to stand Toileting - Clothing Manipulation and Hygiene: +1 Total assistance Where Assessed - Toileting Clothing Manipulation and Hygiene: Standing Transfers/Ambulation Related to ADLs: Pt stood with min A. Pt with anterior lean requiring min A to maintain balance.  Pt made no attempt to correct balance, but returned herself to bed ADL Comments: Pt  resisted OOB, but once legs were off bed, she did stand up with cues.  Stood briefly, then returned to supine.  She followed no commands and would not participate in any ADL activity.     OT Diagnosis: Generalized weakness;Cognitive deficits  OT Problem List: Decreased strength;Decreased activity tolerance;Impaired balance (sitting and/or  standing);Decreased cognition;Impaired vision/perception;Decreased knowledge of use of DME or AE OT Treatment Interventions:     OT Goals(Current goals can be found in the care plan section)    Visit Information  Last OT Received On: 06/23/13 Assistance Needed: +1 History of Present Illness: 79 y.o. female, with recent 2 admissions suspicious for CVA and then TIA, switched from Pradaxa to Eliquis 2 admissions ago recently, severe orthostatic hypotension, peptic ulcer disease, PAD, depression, COPD, hypertension, CAD, DVT in the past, comes in for a another episode of forgetfulness, disoriented behavior and slurred speech which started sometime last night, patient does not recall going to the bed, she did not wear her usual clothes last night and was wearing only a T-shirt when she went to bed which is very unusual for her, husband saw her being extremely anxious the whole night trying to get out of the bed, this morning she was noted wearing some strange clothes and standing in the closet looking lost, she was also found to have slurred speech, and then brought to the ER where she was seen by neurologist, an MRI MRA of the brain was done which showed no new changes, stroke was ruled out and hospitalist service was requested to admit the patient for further workup including workup for seizures       Prior Functioning     Home Living Family/patient expects to be discharged to:: Private residence Living Arrangements: Spouse/significant other Available Help at Discharge: Available 24 hours/day;Family Type of Home: House Home Access: Level entry Home Layout: Multi-level;1/2 bath on main level Alternate Level Stairs-Number of Steps: flight Alternate Level Stairs-Rails: Right Home Equipment: Walker - 2 wheels;Bedside commode;Shower seat Additional Comments: Information above taken from last admission as pt was unable to provide info other than she lives with her spouse Prior Function Comments:  Pt unable to provide info Communication Communication: Expressive difficulties;Other (comment) (difficult to accurately determine as pt with minimal interac) Dominant Hand: Right         Vision/Perception Vision - Assessment Vision Assessment: Vision not tested Additional Comments: Pt would not participate - unsure if this is volitional or due to cognitive or language deficits   Cognition  Cognition Arousal/Alertness: Lethargic Behavior During Therapy:  (lethargic) Overall Cognitive Status: No family/caregiver present to determine baseline cognitive functioning General Comments: Pt awakened with max verbal and tactile cues. Pt moved to EOB with mod A, stood, then returned herself back to bed.  Pt would not follow verbal or gestural commands for further participation    Extremity/Trunk Assessment Upper Extremity Assessment Upper Extremity Assessment: Overall WFL for tasks assessed (Through observation ) Lower Extremity Assessment Lower Extremity Assessment: Defer to PT evaluation Cervical / Trunk Assessment Cervical / Trunk Assessment: Normal     Mobility Bed Mobility Bed Mobility: Supine to Sit;Sitting - Scoot to Edge of Bed;Sit to Supine Supine to Sit: 3: Mod assist;HOB flat Sitting - Scoot to Edge of Bed: 4: Min assist Sit to Supine: 4: Min guard Details for Bed Mobility Assistance: Pt resisted OOB.  Required assist to move LEs off bed and to lift trunk.  She was able to return to supine with min guard assist for safety  Transfers Transfers: Sit to Stand;Stand to Sit Sit to Stand: 4: Min assist;With upper extremity assist;From bed Stand to Sit: 4: Min guard;With upper extremity assist;To bed Details for Transfer Assistance: Pt with anterior lean which she did not spontaneously attempt to correct thus requiring min A      Exercise     Balance Balance Balance Assessed: Yes Static Sitting Balance Static Sitting - Balance Support: Bilateral upper extremity supported;Feet  supported Static Sitting - Level of Assistance: 5: Stand by assistance Static Sitting - Comment/# of Minutes: 2 Static Standing Balance Static Standing - Balance Support: No upper extremity supported Static Standing - Level of Assistance: 4: Min assist Static Standing - Comment/# of Minutes: anterior lean - stood ~30 seconds   End of Session OT - End of Session Activity Tolerance: Patient limited by lethargy;Other (comment) (limited participation ) Patient left: in bed;with call bell/phone within reach;with bed alarm set  GO     Chayden Garrelts, Ursula Alert M 06/23/2013, 12:04 PM

## 2013-06-23 NOTE — Progress Notes (Signed)
Talked to patient with family members present about DCP; patient has a walker and a cane at home; Home Health Care choices offered; pt/ family chose [facility] for [name] services; [facility] with [facility] called for arrangements; Attending MD at discharge please order HHRN/ PT/ OT/ ST/ nurses aide; Abelino Derrick RN,BSN,MHA [phone]

## 2013-06-23 NOTE — Evaluation (Signed)
Physical Therapy Evaluation Patient Details Name: [name] MRN: [id number] DOB: 06/16/1928 Today's Date: 06/23/2013 Time: 1345-1420 PT Time Calculation (min): 35 min  PT Assessment / Plan / Recommendation History of Present Illness  79 y.o. female, with recent 2 admissions suspicious for CVA and then TIA, switched from Pradaxa to Eliquis 2 admissions ago recently, severe orthostatic hypotension, peptic ulcer disease, PAD, depression, COPD, hypertension, CAD, DVT in the past, comes in for a another episode of forgetfulness, disoriented behavior and slurred speech which started sometime last night, patient does not recall going to the bed, she did not wear her usual clothes last night and was wearing only a T-shirt when she went to bed which is very unusual for her, husband saw her being extremely anxious the whole night trying to get out of the bed, this morning she was noted wearing some strange clothes and standing in the closet looking lost, she was also found to have slurred speech, and then brought to the ER where she was seen by neurologist, an MRI MRA of the brain was done which showed no new changes, stroke was ruled out and hospitalist service was requested to admit the patient for further workup including workup for seizures  Clinical Impression  This patient presents with expressive difficulties and decreased functional independence following the above mentioned events. At the time of PT eval, pt was able to perform transfers and ambulation with min assist for balance. Per nursing, pt gets very anxious and agitated when no family is present, however pt overall did not show anxiety in the session. Per husband, pt is to d/c this afternoon. This patient is appropriate for skilled PT interventions to address functional limitations, improve safety and independence with functional mobility, and return to PLOF.     PT Assessment  Patient needs continued PT services    Follow Up  Recommendations  Home health PT    Does the patient have the potential to tolerate intense rehabilitation      Barriers to Discharge        Equipment Recommendations  None recommended by PT    Recommendations for Other Services     Frequency Min 4X/week    Precautions / Restrictions Precautions Precautions: Fall Restrictions Weight Bearing Restrictions: No   Pertinent Vitals/Pain Pt does not report pain throughout session       Mobility  Bed Mobility Bed Mobility: Supine to Sit;Sitting - Scoot to Edge of Bed Supine to Sit: 3: Mod assist;HOB flat Sitting - Scoot to Edge of Bed: 4: Min assist Sit to Supine: 4: Min assist Details for Bed Mobility Assistance: Pt was able to transition to EOB with mod assist. She reached for the bed rails with hand-over-hand assist, but did not utilize it for support when rolling and scooting legs towards edge.  Transfers Transfers: Sit to Stand;Stand to Sit Sit to Stand: 4: Min guard;From bed;With upper extremity assist;From chair/3-in-1 Stand to Sit: 4: Min guard;To chair/3-in-1;To bed;With upper extremity assist Details for Transfer Assistance: Pt able to push up from chair well using arm rests, however from bed pt wanted to grab at walker to stand. Somewhat imprulsive with stand>sit transfer. Ambulation/Gait Ambulation/Gait Assistance: 4: Min assist Ambulation Distance (Feet): 30 Feet Assistive device: Rolling walker Ambulation/Gait Assistance Details: Frequent assist to direct walker, as pt did not steer walker away from obstacles. She reports that she sees objects in her way however does not attempt to go around them.  Gait Pattern: Step-through pattern;Shuffle Gait velocity: Decreased Modified  Rankin (Stroke Patients Only) Pre-Morbid Rankin Score: No significant disability Modified Rankin: Moderate disability    Exercises     PT Diagnosis: Difficulty walking  PT Problem List: Decreased strength;Decreased range of motion;Decreased  activity tolerance;Decreased balance;Decreased mobility;Decreased knowledge of use of DME;Decreased safety awareness;Pain PT Treatment Interventions: DME instruction;Gait training;Stair training;Functional mobility training;Therapeutic activities;Therapeutic exercise;Neuromuscular re-education;Patient/family education     PT Goals(Current goals can be found in the care plan section) Acute Rehab PT Goals Patient Stated Goal: to go home. PT Goal Formulation: With patient Time For Goal Achievement: 07/07/13 Potential to Achieve Goals: Good  Visit Information  Last PT Received On: 06/23/13 Assistance Needed: +1 History of Present Illness: 79 y.o. female, with recent 2 admissions suspicious for CVA and then TIA, switched from Pradaxa to Eliquis 2 admissions ago recently, severe orthostatic hypotension, peptic ulcer disease, PAD, depression, COPD, hypertension, CAD, DVT in the past, comes in for a another episode of forgetfulness, disoriented behavior and slurred speech which started sometime last night, patient does not recall going to the bed, she did not wear her usual clothes last night and was wearing only a T-shirt when she went to bed which is very unusual for her, husband saw her being extremely anxious the whole night trying to get out of the bed, this morning she was noted wearing some strange clothes and standing in the closet looking lost, she was also found to have slurred speech, and then brought to the ER where she was seen by neurologist, an MRI MRA of the brain was done which showed no new changes, stroke was ruled out and hospitalist service was requested to admit the patient for further workup including workup for seizures       Prior Functioning  Home Living Family/patient expects to be discharged to:: Private residence Living Arrangements: Spouse/significant other Available Help at Discharge: Available 24 hours/day;Family Type of Home: House (townhouse) Home Access: Level  entry Home Layout: Multi-level;1/2 bath on main level Alternate Level Stairs-Number of Steps: 13 Alternate Level Stairs-Rails: Can reach both (both 1/2 way, and then wall on right, and railing on left go) Home Equipment: Walker - 2 wheels;Bedside commode;Shower seat Additional Comments: Information confirmed with husband who arrived at end of session  Lives With: Spouse Prior Function Level of Independence: Independent Comments: Per husband, pt would not use her walker and was slightly unsteady, however was able to go up and down the stairs with use of the railings and he always was available for close guard. She was independent with her ADL's. Communication Communication: Expressive difficulties Dominant Hand: Right    Cognition  Cognition Arousal/Alertness: Lethargic Behavior During Therapy: Anxious;Flat affect;Impulsive Overall Cognitive Status: Impaired/Different from baseline Area of Impairment: Orientation;Attention;Following commands;Safety/judgement;Problem solving Orientation Level:  (Does not answer orientation questions appropriately.) Current Attention Level: Selective Following Commands: Follows one step commands inconsistently Safety/Judgement: Decreased awareness of safety;Decreased awareness of deficits Problem Solving: Slow processing;Decreased initiation    Extremity/Trunk Assessment Upper Extremity Assessment Upper Extremity Assessment: Defer to OT evaluation Lower Extremity Assessment Lower Extremity Assessment: Overall WFL for tasks assessed Cervical / Trunk Assessment Cervical / Trunk Assessment: Normal   Balance Balance Balance Assessed: Yes Static Sitting Balance Static Sitting - Balance Support: Feet supported;No upper extremity supported Static Sitting - Level of Assistance: 5: Stand by assistance Static Standing Balance Static Standing - Balance Support: No upper extremity supported Static Standing - Level of Assistance: 4: Min assist  End of Session  PT - End of Session Equipment Utilized During Treatment: Gait  belt Activity Tolerance: Patient tolerated treatment well Patient left: in bed;with call bell/phone within reach;with family/visitor present Nurse Communication: Mobility status  GP     Ruthann Cancer 06/23/2013, 3:10 PM  Ruthann Cancer, PT, DPT [phone]

## 2013-06-26 ENCOUNTER — Telehealth: Payer: Self-pay | Admitting: Internal Medicine

## 2013-06-26 ENCOUNTER — Encounter (HOSPITAL_COMMUNITY): Payer: Self-pay | Admitting: Emergency Medicine

## 2013-06-26 ENCOUNTER — Emergency Department (HOSPITAL_COMMUNITY): Payer: Medicare Other

## 2013-06-26 ENCOUNTER — Ambulatory Visit: Payer: Medicare Other

## 2013-06-26 ENCOUNTER — Inpatient Hospital Stay (HOSPITAL_COMMUNITY)
Admission: EM | Admit: 2013-06-26 | Discharge: 2013-06-30 | DRG: 193 | Disposition: A | Discharge status: Home Health | Payer: Medicare Other | Attending: Internal Medicine | Admitting: Internal Medicine

## 2013-06-26 ENCOUNTER — Ambulatory Visit: Payer: Medicare Other | Admitting: Physical Therapy

## 2013-06-26 DIAGNOSIS — Z8521 Personal history of malignant neoplasm of larynx: Secondary | ICD-10-CM

## 2013-06-26 DIAGNOSIS — F3289 Other specified depressive episodes: Secondary | ICD-10-CM | POA: Diagnosis present

## 2013-06-26 DIAGNOSIS — D649 Anemia, unspecified: Secondary | ICD-10-CM

## 2013-06-26 DIAGNOSIS — Z8711 Personal history of peptic ulcer disease: Secondary | ICD-10-CM

## 2013-06-26 DIAGNOSIS — F32A Depression, unspecified: Secondary | ICD-10-CM | POA: Diagnosis present

## 2013-06-26 DIAGNOSIS — Z86718 Personal history of other venous thrombosis and embolism: Secondary | ICD-10-CM

## 2013-06-26 DIAGNOSIS — I4891 Unspecified atrial fibrillation: Secondary | ICD-10-CM

## 2013-06-26 DIAGNOSIS — R627 Adult failure to thrive: Secondary | ICD-10-CM | POA: Diagnosis present

## 2013-06-26 DIAGNOSIS — R197 Diarrhea, unspecified: Secondary | ICD-10-CM

## 2013-06-26 DIAGNOSIS — Z515 Encounter for palliative care: Secondary | ICD-10-CM

## 2013-06-26 DIAGNOSIS — I739 Peripheral vascular disease, unspecified: Secondary | ICD-10-CM | POA: Diagnosis present

## 2013-06-26 DIAGNOSIS — M129 Arthropathy, unspecified: Secondary | ICD-10-CM | POA: Diagnosis present

## 2013-06-26 DIAGNOSIS — R1319 Other dysphagia: Secondary | ICD-10-CM

## 2013-06-26 DIAGNOSIS — Z87891 Personal history of nicotine dependence: Secondary | ICD-10-CM

## 2013-06-26 DIAGNOSIS — J449 Chronic obstructive pulmonary disease, unspecified: Secondary | ICD-10-CM | POA: Diagnosis present

## 2013-06-26 DIAGNOSIS — F411 Generalized anxiety disorder: Secondary | ICD-10-CM

## 2013-06-26 DIAGNOSIS — N179 Acute kidney failure, unspecified: Secondary | ICD-10-CM

## 2013-06-26 DIAGNOSIS — R5381 Other malaise: Secondary | ICD-10-CM

## 2013-06-26 DIAGNOSIS — I10 Essential (primary) hypertension: Secondary | ICD-10-CM | POA: Diagnosis present

## 2013-06-26 DIAGNOSIS — J4489 Other specified chronic obstructive pulmonary disease: Secondary | ICD-10-CM | POA: Diagnosis present

## 2013-06-26 DIAGNOSIS — K219 Gastro-esophageal reflux disease without esophagitis: Secondary | ICD-10-CM | POA: Diagnosis present

## 2013-06-26 DIAGNOSIS — I480 Paroxysmal atrial fibrillation: Secondary | ICD-10-CM | POA: Diagnosis present

## 2013-06-26 DIAGNOSIS — R509 Fever, unspecified: Secondary | ICD-10-CM

## 2013-06-26 DIAGNOSIS — Z96659 Presence of unspecified artificial knee joint: Secondary | ICD-10-CM

## 2013-06-26 DIAGNOSIS — R531 Weakness: Secondary | ICD-10-CM

## 2013-06-26 DIAGNOSIS — Z823 Family history of stroke: Secondary | ICD-10-CM

## 2013-06-26 DIAGNOSIS — R569 Unspecified convulsions: Secondary | ICD-10-CM | POA: Diagnosis present

## 2013-06-26 DIAGNOSIS — J189 Pneumonia, unspecified organism: Secondary | ICD-10-CM

## 2013-06-26 DIAGNOSIS — E785 Hyperlipidemia, unspecified: Secondary | ICD-10-CM | POA: Diagnosis present

## 2013-06-26 DIAGNOSIS — F329 Major depressive disorder, single episode, unspecified: Secondary | ICD-10-CM | POA: Diagnosis present

## 2013-06-26 DIAGNOSIS — Z66 Do not resuscitate: Secondary | ICD-10-CM | POA: Diagnosis present

## 2013-06-26 DIAGNOSIS — Z8673 Personal history of transient ischemic attack (TIA), and cerebral infarction without residual deficits: Secondary | ICD-10-CM

## 2013-06-26 DIAGNOSIS — I2510 Atherosclerotic heart disease of native coronary artery without angina pectoris: Secondary | ICD-10-CM | POA: Diagnosis present

## 2013-06-26 DIAGNOSIS — J9601 Acute respiratory failure with hypoxia: Secondary | ICD-10-CM | POA: Diagnosis present

## 2013-06-26 DIAGNOSIS — J9600 Acute respiratory failure, unspecified whether with hypoxia or hypercapnia: Secondary | ICD-10-CM

## 2013-06-26 HISTORY — DX: Pneumonia, unspecified organism: J18.9

## 2013-06-26 LAB — POCT I-STAT TROPONIN I

## 2013-06-26 LAB — CBC WITH DIFFERENTIAL/PLATELET
Basophils Relative: 0 % (ref 0–1)
Eosinophils Absolute: 0.1 10*3/uL (ref 0.0–0.7)
Eosinophils Relative: 4 % (ref 0–5)
Hemoglobin: 7.2 g/dL — ABNORMAL LOW (ref 12.0–15.0)
Lymphocytes Relative: 25 % (ref 12–46)
Lymphs Abs: 0.6 10*3/uL — ABNORMAL LOW (ref 0.7–4.0)
MCH: 29.5 pg (ref 26.0–34.0)
MCV: 90.2 fL (ref 78.0–100.0)
Monocytes Relative: 13 % — ABNORMAL HIGH (ref 3–12)
Neutro Abs: 1.5 10*3/uL — ABNORMAL LOW (ref 1.7–7.7)
Neutrophils Relative %: 58 % (ref 43–77)
Platelets: 97 10*3/uL — ABNORMAL LOW (ref 150–400)
RBC: 2.44 MIL/uL — ABNORMAL LOW (ref 3.87–5.11)
WBC: 2.6 10*3/uL — ABNORMAL LOW (ref 4.0–10.5)

## 2013-06-26 LAB — CBC
HCT: 37.7 % (ref 36.0–46.0)
Hemoglobin: 12.6 g/dL (ref 12.0–15.0)
MCH: 29.4 pg (ref 26.0–34.0)
MCHC: 33.4 g/dL (ref 30.0–36.0)
Platelets: 159 10*3/uL (ref 150–400)

## 2013-06-26 LAB — CG4 I-STAT (LACTIC ACID): Lactic Acid, Venous: 1.26 mmol/L (ref 0.5–2.2)

## 2013-06-26 LAB — PREPARE RBC (CROSSMATCH)

## 2013-06-26 LAB — GLUCOSE, CAPILLARY: Glucose-Capillary: 100 mg/dL — ABNORMAL HIGH (ref 70–99)

## 2013-06-26 MED ORDER — FUROSEMIDE 10 MG/ML IJ SOLN
40.0000 mg | Freq: Once | INTRAMUSCULAR | Status: AC
  Administered 2013-06-26: 40 mg via INTRAVENOUS
  Filled 2013-06-26: qty 4

## 2013-06-26 MED ORDER — LEVETIRACETAM 750 MG PO TABS
750.0000 mg | ORAL_TABLET | Freq: Two times a day (BID) | ORAL | Status: DC
  Administered 2013-06-26 – 2013-06-30 (×8): 750 mg via ORAL
  Filled 2013-06-26 (×9): qty 1

## 2013-06-26 MED ORDER — DULOXETINE HCL 30 MG PO CPEP
30.0000 mg | ORAL_CAPSULE | Freq: Every day | ORAL | Status: DC
  Filled 2013-06-26 (×2): qty 1

## 2013-06-26 MED ORDER — ALBUTEROL SULFATE (5 MG/ML) 0.5% IN NEBU
5.0000 mg | INHALATION_SOLUTION | Freq: Once | RESPIRATORY_TRACT | Status: AC
  Administered 2013-06-26: 5 mg via RESPIRATORY_TRACT
  Filled 2013-06-26: qty 1

## 2013-06-26 MED ORDER — BISOPROLOL FUMARATE 5 MG PO TABS
5.0000 mg | ORAL_TABLET | Freq: Every day | ORAL | Status: DC
  Administered 2013-06-27 – 2013-06-30 (×4): 5 mg via ORAL
  Filled 2013-06-26 (×4): qty 1

## 2013-06-26 MED ORDER — VANCOMYCIN HCL IN DEXTROSE 750-5 MG/150ML-% IV SOLN
750.0000 mg | Freq: Two times a day (BID) | INTRAVENOUS | Status: DC
  Administered 2013-06-27: 750 mg via INTRAVENOUS
  Filled 2013-06-26 (×2): qty 150

## 2013-06-26 MED ORDER — DEXTROSE-NACL 5-0.45 % IV SOLN
INTRAVENOUS | Status: AC
  Administered 2013-06-26: 50 mL/h via INTRAVENOUS
  Administered 2013-06-27: via INTRAVENOUS

## 2013-06-26 MED ORDER — PIPERACILLIN-TAZOBACTAM 3.375 G IVPB
3.3750 g | Freq: Three times a day (TID) | INTRAVENOUS | Status: DC
  Filled 2013-06-26 (×2): qty 50

## 2013-06-26 MED ORDER — RIVAROXABAN 15 MG PO TABS
15.0000 mg | ORAL_TABLET | Freq: Every day | ORAL | Status: DC
  Administered 2013-06-26 – 2013-06-29 (×4): 15 mg via ORAL
  Filled 2013-06-26 (×5): qty 1

## 2013-06-26 MED ORDER — ALBUTEROL SULFATE (5 MG/ML) 0.5% IN NEBU: 2.5000 mg | INHALATION_SOLUTION | Freq: Four times a day (QID) | RESPIRATORY_TRACT | Status: DC | PRN

## 2013-06-26 MED ORDER — ALPRAZOLAM 0.5 MG PO TABS
0.5000 mg | ORAL_TABLET | Freq: Every evening | ORAL | Status: DC | PRN
  Administered 2013-06-26 – 2013-06-29 (×3): 0.5 mg via ORAL
  Filled 2013-06-26 (×3): qty 1

## 2013-06-26 MED ORDER — NEOMYCIN-POLYMYXIN-DEXAMETH 3.5-10000-0.1 OP SUSP
1.0000 [drp] | Freq: Four times a day (QID) | OPHTHALMIC | Status: DC
  Administered 2013-06-26 – 2013-06-30 (×15): 1 [drp] via OPHTHALMIC
  Filled 2013-06-26: qty 5

## 2013-06-26 MED ORDER — ATORVASTATIN CALCIUM 10 MG PO TABS
10.0000 mg | ORAL_TABLET | Freq: Every day | ORAL | Status: DC
  Filled 2013-06-26 (×2): qty 1

## 2013-06-26 MED ORDER — VANCOMYCIN HCL IN DEXTROSE 1-5 GM/200ML-% IV SOLN: 1000.0000 mg | Freq: Once | INTRAVENOUS | Status: DC

## 2013-06-26 MED ORDER — PIPERACILLIN-TAZOBACTAM 3.375 G IVPB
3.3750 g | Freq: Three times a day (TID) | INTRAVENOUS | Status: DC
  Administered 2013-06-26 – 2013-06-30 (×12): 3.375 g via INTRAVENOUS
  Filled 2013-06-26 (×14): qty 50

## 2013-06-26 MED ORDER — SODIUM CHLORIDE 0.9 % IV BOLUS (SEPSIS)
1000.0000 mL | Freq: Once | INTRAVENOUS | Status: AC
  Administered 2013-06-26: 1000 mL via INTRAVENOUS

## 2013-06-26 MED ORDER — VANCOMYCIN HCL IN DEXTROSE 1-5 GM/200ML-% IV SOLN
1000.0000 mg | Freq: Two times a day (BID) | INTRAVENOUS | Status: DC
  Filled 2013-06-26: qty 200

## 2013-06-26 MED ORDER — SODIUM CHLORIDE 0.9 % IV SOLN
1250.0000 mg | INTRAVENOUS | Status: AC
  Administered 2013-06-26: 1250 mg via INTRAVENOUS
  Filled 2013-06-26: qty 1250

## 2013-06-26 MED ORDER — BUDESONIDE 0.25 MG/2ML IN SUSP
0.2500 mg | Freq: Two times a day (BID) | RESPIRATORY_TRACT | Status: DC
  Administered 2013-06-26 – 2013-06-30 (×7): 0.25 mg via RESPIRATORY_TRACT
  Filled 2013-06-26 (×11): qty 2

## 2013-06-26 MED ORDER — PANTOPRAZOLE SODIUM 40 MG PO TBEC
40.0000 mg | DELAYED_RELEASE_TABLET | Freq: Two times a day (BID) | ORAL | Status: DC
  Administered 2013-06-26 – 2013-06-30 (×7): 40 mg via ORAL
  Filled 2013-06-26 (×7): qty 1

## 2013-06-26 MED ORDER — DIPHENHYDRAMINE HCL 50 MG/ML IJ SOLN
25.0000 mg | Freq: Four times a day (QID) | INTRAMUSCULAR | Status: DC | PRN
  Administered 2013-06-26: 25 mg via INTRAVENOUS

## 2013-06-26 MED ORDER — PIPERACILLIN-TAZOBACTAM 3.375 G IVPB: 3.3750 g | Freq: Once | INTRAVENOUS | Status: DC

## 2013-06-26 MED ORDER — DIPHENHYDRAMINE HCL 50 MG/ML IJ SOLN
25.0000 mg | Freq: Four times a day (QID) | INTRAMUSCULAR | Status: DC | PRN
  Filled 2013-06-26 (×2): qty 1

## 2013-06-26 NOTE — Telephone Encounter (Signed)
Per Oran Rein, call EMS and have pt transported to hospital.   Kris/[facility] is aware and verbalized understanding

## 2013-06-26 NOTE — Progress Notes (Signed)
ANTIBIOTIC CONSULT NOTE - INITIAL  Pharmacy Consult:  Pharmacy to renally adjust antibiotics  Allergies  Allergen Reactions  . Ativan [Lorazepam] Other (See Comments)    Severe hallucinations   . Propoxyphene Hcl     Rxn: unknown    Patient Measurements: Height: 5\' 4"  (162.6 cm) Weight: 185 lb 9.6 oz (84.188 kg) IBW/kg (Calculated) : 54.7  Vital Signs: Temp: 98.4 F (36.9 C) (11/24 1707) Temp src: Oral (11/24 1707) BP: 139/80 mmHg (11/24 1707) Pulse Rate: 97 (11/24 1707)  Labs:  Recent Labs  06/26/13 1458  WBC 2.6*  HGB 7.2*  PLT 97*   Estimated Creatinine Clearance: 44.7 ml/min (by C-G formula based on Cr of 1). No results found for this basename: VANCOTROUGH, VANCOPEAK, VANCORANDOM, GENTTROUGH, GENTPEAK, GENTRANDOM, TOBRATROUGH, TOBRAPEAK, TOBRARND, AMIKACINPEAK, AMIKACINTROU, AMIKACIN,  in the last 72 hours   Microbiology: No results found for this or any previous visit (from the past 720 hour(s)).  Medical History: Past Medical History  Diagnosis Date  . GERD (gastroesophageal reflux disease)   . History of TIAs   . Diverticulosis   . PUD (peptic ulcer disease)   . PVD (peripheral vascular disease)   . Hyperlipidemia   . Anxiety   . Depression   . Blood transfusion     "w/1st miscarriage" (05/26/2013)  . COPD (chronic obstructive pulmonary disease)   . Hypertension   . PONV (postoperative nausea and vomiting)   . Family history of anesthesia complication     "sisters also get sick as a dog" (05/26/2013)  . Coronary artery disease   . Heart murmur     "when I was a child" (05/26/2013)  . DVT (deep venous thrombosis) 1954    "after I had my first child" (05/26/2013)  . Pneumonia 2013    "once" (05/26/2013)  . Exertional shortness of breath   . H/O hiatal hernia   . Stroke 10/2002    no residual  . Stroke 05/26/2013    "very mild; affected my speech for a while" (05/26/2013)  . Arthritis     "lots; hands" (05/26/2013)  . Laryngeal carcinoma hx  of ca    remotely with resection and xrt/chronic hoarsemenss  . Lymphona, mantle cell, inguinal region/lower limb     "large c-cell" (05/26/2013)      Assessment: [name] recently discharged on 06/23/13 post TIA/stroke admission.  Patient presented 06/26/13 with increased SOB, coughing and generalized weakness.  Found to have PNA and Pharmacy consulted to renally adjust antibiotics (vancomycin and Zosyn).  Patient's renal function has been stable.  Patient has not received any antibiotics in the ED per [facility].   Goal of Therapy:  Vancomycin trough level 15-20 mcg/ml   Plan:  - Change vanc to 1250mg  IV x 1, then 750mg  IV Q12H - Zosyn 3.375gm IV Q8H, 4 hr infusion - Monitor renal fxn, clinical course, vanc trough as indicated - Monitor closely for s/sx of bleeding while on Xarelto    Shaterica Mcclatchy D. Laney Potash, PharmD, BCPS Pager:  [phone] 06/26/2013, 5:41 PM

## 2013-06-26 NOTE — ED Provider Notes (Signed)
CSN: [id number]     Arrival date & time 06/26/13  1253 History   First MD Initiated Contact with Patient 06/26/13 1254     Chief Complaint  Patient presents with  . Weakness   (Consider location/radiation/quality/duration/timing/severity/associated sxs/prior Treatment) HPI  79 year old female, history of TIA, heart murmur, stroke, laryngeal carcinoma presents for evaluations of generalized weakness. History obtained through family member who is at bedside. Patient was brought here via EMS. According to family member patient has increased generalized weakness ongoing for the past weeks. She normally able to walk with assist however today she was unable to get out of bed. For the past 3 weeks patient has had a deterioration of her health. Last week she was aphasic, he evaluated ER and admitted for evidence of TIA with finding consistence with embolic lacunar infarct. She also had an episode of seizure in which she currently taking Keppra. She was discharged from the hospital a week ago, has been doing okay at home however is having increased generalized weakness. Her home health nurse felt the patient is dehydrated, family member states she has been eating and drinking as usual. They worries patient may be dehydrated and brought to ER for further management. No 5 caveat apply due to altered mental status.    Past Medical History  Diagnosis Date  . GERD (gastroesophageal reflux disease)   . History of TIAs   . Diverticulosis   . PUD (peptic ulcer disease)   . PVD (peripheral vascular disease)   . Hyperlipidemia   . Anxiety   . Depression   . Blood transfusion     "w/1st miscarriage" (05/26/2013)  . COPD (chronic obstructive pulmonary disease)   . Hypertension   . PONV (postoperative nausea and vomiting)   . Family history of anesthesia complication     "sisters also get sick as a dog" (05/26/2013)  . Coronary artery disease   . Heart murmur     "when I was a child" (05/26/2013)  . DVT  (deep venous thrombosis) 1954    "after I had my first child" (05/26/2013)  . Pneumonia 2013    "once" (05/26/2013)  . Exertional shortness of breath   . H/O hiatal hernia   . Stroke 10/2002    no residual  . Stroke 05/26/2013    "very mild; affected my speech for a while" (05/26/2013)  . Arthritis     "lots; hands" (05/26/2013)  . 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 Lymphona, mantle cell, inguinal region/lower limb     "large c-cell" (05/26/2013)   Past Surgical History  Procedure Laterality Date  . 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 Tubal ligation  1972  . Radical neck dissection  1986    "larynx cancer" (05/26/2013)  . Total knee arthroplasty Bilateral     2002 left 2008 right  . Ileostomy closure  01/2010  . Cataract extraction  2005    bilateral  . Carotid endarterectomy Bilateral 2004    2004 a month apart right and left  . Tonsillectomy and adenoidectomy  1936    "tonsils grew back; no 2nd OR" (05/26/2013)  . Hernia repair  05/2010    ventral hernia repair  . Carpal tunnel release Left ?1960's  . Abdominal exploration surgery  12/01/2012  . Appendectomy  09/13/2008  . Colon surgery  09/13/2008    Laparoscopic assisted converted to open sigmoid colectomy.Hattie Perch 09/13/2008 (05/26/2013)  .  Cataract extraction w/ intraocular lens  implant, bilateral Bilateral ~ 2003  . Dilation and curettage of uterus  1950's    "had 2 for miscarriages" (05/26/2013)   Family History  Problem Relation Age of Onset  . Colon cancer Sister     colon  . Hyperlipidemia Mother   . Stroke Mother   . Cancer Father     mesothelioma   History  Substance Use Topics  . Smoking status: Former Smoker -- 2.50 packs/day for 37 years    Types: Cigarettes    Quit date: 09/05/1984  . Smokeless tobacco: Never Used  . Alcohol Use: No   OB History   Grav Para Term Preterm Abortions TAB SAB  Ect Mult Living                 Review of Systems  Unable to perform ROS   Allergies  Propoxyphene hcl  Home Medications   Current Outpatient Rx  Name  Route  Sig  Dispense  Refill  . ALPRAZolam (XANAX) 0.5 MG tablet   Oral   Take 1 tablet (0.5 mg total) by mouth at bedtime as needed for sleep.   30 tablet   3   . atorvastatin (LIPITOR) 10 MG tablet   Oral   Take 1 tablet (10 mg total) by mouth daily at 6 PM.   30 tablet   0   . bisoprolol (ZEBETA) 5 MG tablet   Oral   Take 5 mg by mouth daily at 12 noon.          . Cyanocobalamin (VITAMIN B12 PO)   Oral   Take 5,000 mcg by mouth daily.         . diclofenac sodium (VOLTAREN) 1 % GEL   Topical   Apply 2 g topically 4 (four) times daily as needed (pain; apply to hand).          . DULoxetine (CYMBALTA) 30 MG capsule   Oral   Take 1 capsule (30 mg total) by mouth daily.   30 capsule   0   . levETIRAcetam (KEPPRA) 750 MG tablet   Oral   Take 1 tablet (750 mg total) by mouth 2 (two) times daily.   60 tablet   0   . loperamide (IMODIUM) 2 MG capsule   Oral   Take 2 mg by mouth daily as needed for diarrhea or loose stools.          Marland Kitchen neomycin-polymyxin b-dexamethasone (MAXITROL) 3.5-10000-0.1 SUSP   Left Eye   Place 1 drop into the left eye every 6 (six) hours.   1 Bottle   0   . Rivaroxaban (XARELTO) 15 MG TABS tablet   Oral   Take 1 tablet (15 mg total) by mouth daily after supper.   30 tablet   0   . Vitamin D, Ergocalciferol, (DRISDOL) 50000 UNITS CAPS   Oral   Take 50,000 Units by mouth every Sunday.          There were no vitals taken for this visit. Physical Exam  Nursing note and vitals reviewed. Constitutional: She appears well-developed and well-nourished. No distress.  Patient appears drowsy but arousable  HENT:  Head: Normocephalic and atraumatic.  Lips are dry but mouth is moist  Eyes: Conjunctivae and EOM are normal. Pupils are equal, round, and reactive to light.  Neck:  Normal range of motion. Neck supple.  Cardiovascular:  Irregularly irregular without obvious murmurs rubs or gallops  Pulmonary/Chest:  Decreased breath  sounds and poor respiratory effort. No obvious rales  Abdominal: Soft. There is no tenderness.  Musculoskeletal: She exhibits no edema.  Neurological: She is alert. No sensory deficit. GCS eye subscore is 4. GCS verbal subscore is 5. GCS motor subscore is 5.  Poor effort, 5 out of 5 strength to upper extremities, 4/5 strength to lower extremities bilaterally  Gait not tested.  Coordination not tested as pt does not follow direction.  Skin: No rash noted.  Psychiatric: She has a normal mood and affect.    ED Course  Procedures (including critical care time)  1:28 PM Pt with increased generalized weakness.  Recently hospitalized for TIA.  Pt sats at 91% on RA.  Hx difficult to obtain although pt able to follow direction and having mild resp distress. Work up initiated.  Concern for possible HCAP.  Care discussed with attending.    3:16 PM CXR shows poor inspiration with mild basilar atelectasis.  Mild basilar pna cannot be excluded.  Evidence of COPD.  In light of this CXR, recent hospitalization, and presenting hypoxia will treat for suspected HCAP with vanc/zosyn.  Albuterol nebs started.  Blood cultures ordered.    3:59 PM I have consulted with [facility], Dr. Gwenlyn Perking who agrees to admit pt to med surg, team 10, under his care.    Labs Review Labs Reviewed  CBC WITH DIFFERENTIAL - Abnormal; Notable for the following:    Platelets 97 (*)    All other components within normal limits  GLUCOSE, CAPILLARY - Abnormal; Notable for the following:    Glucose-Capillary 100 (*)    All other components within normal limits  CULTURE, BLOOD (ROUTINE X 2)  CULTURE, BLOOD (ROUTINE X 2)  URINALYSIS, ROUTINE W REFLEX MICROSCOPIC  CG4 I-STAT (LACTIC ACID)  POCT I-STAT TROPONIN I   Imaging Review Dg Chest 2 View  06/26/2013   CLINICAL  DATA:  Hypoxia.  COPD.  EXAM: CHEST  2 VIEW  COMPARISON:  06/08/2013.  FINDINGS: Mediastinum and hilar structures are normal. Patient has taken a poor inspiration with mild basilar atelectasis. Mild pneumonia in the lung bases cannot be excluded. COPD. No pleural effusion or pneumothorax. Cardiomegaly, no pulmonary venous congestion. Degenerative changes both shoulders. Degenerative changes thoracic spine.  IMPRESSION: 1. Poor inspiration with mild basilar atelectasis. Mild basilar pneumonia cannot be excluded. COPD. 2. Cardiomegaly.  No CHF.   Electronically Signed   By: Maisie Fus  Register   On: 06/26/2013 14:29   Ct Head Wo Contrast  06/26/2013   CLINICAL DATA:  Weakness, history of CVA.  EXAM: CT HEAD WITHOUT CONTRAST  TECHNIQUE: Contiguous axial images were obtained from the base of the skull through the vertex without intravenous contrast.  COMPARISON:  06/21/2013  FINDINGS: There is further evolution and decrease in density of the left-sided deep white matter infarct centered in the region of the corona radiata and superior aspect of the basal ganglia. Advanced small vessel ischemic changes are noted in the periventricular white matter. The brain demonstrates no evidence of hemorrhage, new infarction, edema, mass effect, extra-axial fluid collection, hydrocephalus or mass lesion. The skull is unremarkable.  IMPRESSION: No new or acute findings. Further decrease in density of the left-sided deep white matter infarct centered in the corona radiata.   Electronically Signed   By: Irish Lack M.D.   On: 06/26/2013 15:03    EKG Interpretation   None       MDM   1. Generalized weakness   2. HCAP (healthcare-associated pneumonia)  BP 146/68  Pulse 79  Temp(Src) 98.6 F (37 C) (Rectal)  Resp 18  SpO2 96%  I have reviewed nursing notes and vital signs. I personally reviewed the imaging tests through PACS system  I reviewed available ER/hospitalization records thought the EMR     Fayrene Helper, [place] 06/26/13 1601

## 2013-06-26 NOTE — Progress Notes (Signed)
[facility]  Patient Status: Active (receiving services up to time of hospitalization)  [facility] is providing the following services: RN, PT, OT and HHA  If patient discharges after hours, please call [phone].   [name] 06/26/2013, 7:28 PM

## 2013-06-26 NOTE — ED Notes (Signed)
MD at bedside. 

## 2013-06-26 NOTE — Progress Notes (Signed)
Pt with pinkness to hair and scalp from recent EEG. Pt also now with redness to forehead and rash. Per MD pt is to get 25mg  of benadryl IV and to stop antibiotics. RN will continue to monitor pt.

## 2013-06-26 NOTE — ED Notes (Signed)
PA at bedside.

## 2013-06-26 NOTE — ED Notes (Signed)
Per EMS- Pt comes from home where she has had increasing weakness over past few weeks reported by family and home health. Today her weakness was worse and she wasn't able to get out of bed. At baseline she walks with stand by assist. BP 130/80, HR 80 regular, CBG 103, 90% RA, 95% with 4 L. Pt is alert and oriented, notices patient changes topics frequently when talking. Has 22 gauge to right hand.

## 2013-06-26 NOTE — Progress Notes (Signed)
[name] [id number]  Code Status: Full  Admission Data: 06/26/2013 5:10 PM  Attending Provider: [place]  WUJ:[id number],WGNF EDWARD, MD  Consults/ Treatment Team:    [name] is a 79 y.o. female patient admitted from ED awake, alert - oriented X 3 - no acute distress noted. VSS - Blood pressure 139/80, pulse 97, temperature 98.4 F (36.9 C), temperature source Oral, resp. rate 20, height 5\' 4"  (1.626 m), weight 84.188 kg (185 lb 9.6 oz), SpO2 93.00%. no c/o shortness of breath, no c/o chest pain.  O2: @2  l/min per [place].  IV Fluids: IV in place, occlusive dsg intact without redness, IV cath forearm right, condition patent and no redness  D5W/0.45 NaCl.  Allergies:  Allergies  Allergen Reactions  . Ativan [Lorazepam] Other (See Comments)    Severe hallucinations   . Propoxyphene Hcl     Rxn: unknown      Past Medical History  Diagnosis Date  . GERD (gastroesophageal reflux disease)   . History of TIAs   . Diverticulosis   . PUD (peptic ulcer disease)   . PVD (peripheral vascular disease)   . Hyperlipidemia   . Anxiety   . Depression   . Blood transfusion     "w/1st miscarriage" (05/26/2013)  . COPD (chronic obstructive pulmonary disease)   . Hypertension   . PONV (postoperative nausea and vomiting)   . Family history of anesthesia complication     "sisters also get sick as a dog" (05/26/2013)  . Coronary artery disease   . Heart murmur     "when I was a child" (05/26/2013)  . DVT (deep venous thrombosis) 1954    "after I had my first child" (05/26/2013)  . Pneumonia 2013    "once" (05/26/2013)  . Exertional shortness of breath   . H/O hiatal hernia   . Stroke 10/2002    no residual  . Stroke 05/26/2013    "very mild; affected my speech for a while" (05/26/2013)  . Arthritis     "lots; hands" (05/26/2013)  . 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 Lymphona, mantle cell, inguinal region/lower limb    "large c-cell" (05/26/2013)    Medications Prior to Admission  Medication Sig Dispense Refill  . ALPRAZolam (XANAX) 0.5 MG tablet Take 1 tablet (0.5 mg total) by mouth at bedtime as needed for sleep.  30 tablet  3  . atorvastatin (LIPITOR) 10 MG tablet Take 1 tablet (10 mg total) by mouth daily at 6 PM.  30 tablet  0  . bisoprolol (ZEBETA) 5 MG tablet Take 5 mg by mouth daily at 12 noon.       . Cyanocobalamin (VITAMIN B12 PO) Take 5,000 mcg by mouth daily.      . DULoxetine (CYMBALTA) 30 MG capsule Take 1 capsule (30 mg total) by mouth daily.  30 capsule  0  . levETIRAcetam (KEPPRA) 750 MG tablet Take 1 tablet (750 mg total) by mouth 2 (two) times daily.  60 tablet  0  . loperamide (IMODIUM) 2 MG capsule Take 2 mg by mouth daily as needed for diarrhea or loose stools.       Marland Kitchen neomycin-polymyxin b-dexamethasone (MAXITROL) 3.5-10000-0.1 SUSP Place 1 drop into the left eye every 6 (six) hours.  1 Bottle  0  . Rivaroxaban (XARELTO) 15 MG TABS tablet Take 1 tablet (15 mg total) by mouth daily after supper.  30 tablet  0  . Vitamin D,  Ergocalciferol, (DRISDOL) 50000 UNITS CAPS Take 50,000 Units by mouth every Sunday.        History: obtained from daughter Clydie Braun  Tobacco/alcohol: denied none  Orientation to room, and floor completed with information packet given to patient/family. Patient declined safety video at this time. Admission INP armband ID verified with patient/family, and in place.  SR up x 2, fall assessment complete, with patient and family able to verbalize understanding of risk associated with falls, and verbalized understanding to call nsg before up out of bed. Call light within reach, patient able to voice, and demonstrate understanding. Skin, clean-dry- intact without evidence of bruising, or skin tears.  No evidence of skin break down noted on exam.  ?  Will cont to eval and treat per MD orders.  Al Decant, [place]  06/26/2013 5:10 PM

## 2013-06-26 NOTE — H&P (Signed)
[facility] History and Physical  [name] RUE:[id number] DOB: 03-14-1928 DOA: 06/26/2013  Referring physician: Dr. Gwendolyn Grant PCP: [name], MD  Specialists: Dr. Pearlean Brownie neurology  Chief Complaint: increase SOB, coughing and generalized weakness  HPI: [name] is a 79 y.o. female with PMH of stroke and frequent admissions for TIAs, recent seizure, hypertension hyperlipidemia, COPD, atrial fibrillation requiring anticoagulation that presents emergency room complaining of generalized weakness, increased SOB and non productive cough. Patient and family denies fever, chills, nausea, vomiting, CP, palpitations, seizure activity or any other complaints. Patient was just discharge home on 11/21 due to TIA/stroke admission. In ED was found to be hypoxic on RA, with Hgb of 7.2, leukopenia and CXR with infiltrates. [facility] called to admit patient for further evaluation and treatment.   Review of Systems:  Negative except as mentioned on HPI.  Past Medical History  Diagnosis Date  . GERD (gastroesophageal reflux disease)   . History of TIAs   . Diverticulosis   . PUD (peptic ulcer disease)   . PVD (peripheral vascular disease)   . Hyperlipidemia   . Anxiety   . Depression   . Blood transfusion     "w/1st miscarriage" (05/26/2013)  . COPD (chronic obstructive pulmonary disease)   . Hypertension   . PONV (postoperative nausea and vomiting)   . Family history of anesthesia complication     "sisters also get sick as a dog" (05/26/2013)  . Coronary artery disease   . Heart murmur     "when I was a child" (05/26/2013)  . DVT (deep venous thrombosis) 1954    "after I had my first child" (05/26/2013)  . Pneumonia 2013    "once" (05/26/2013)  . Exertional shortness of breath   . H/O hiatal hernia   . Stroke 10/2002    no residual  . Stroke 05/26/2013    "very mild; affected my speech for a while" (05/26/2013)  . Arthritis     "lots; hands" (05/26/2013)  .  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 Lymphona, mantle cell, inguinal region/lower limb     "large c-cell" (05/26/2013)   Past Surgical History  Procedure Laterality Date  . 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 Tubal ligation  1972  . Radical neck dissection  1986    "larynx cancer" (05/26/2013)  . Total knee arthroplasty Bilateral     2002 left 2008 right  . Ileostomy closure  01/2010  . Cataract extraction  2005    bilateral  . Carotid endarterectomy Bilateral 2004    2004 a month apart right and left  . Tonsillectomy and adenoidectomy  1936    "tonsils grew back; no 2nd OR" (05/26/2013)  . Hernia repair  05/2010    ventral hernia repair  . Carpal tunnel release Left ?1960's  . Abdominal exploration surgery  12/01/2012  . Appendectomy  09/13/2008  . Colon surgery  09/13/2008    Laparoscopic assisted converted to open sigmoid colectomy.Hattie Perch 09/13/2008 (05/26/2013)  . Cataract extraction w/ intraocular lens  implant, bilateral Bilateral ~ 2003  . Dilation and curettage of uterus  1950's    "had 2 for miscarriages" (05/26/2013)   Social History:  reports that she quit smoking about 28 years ago. Her smoking use included Cigarettes. She has a 92.5 pack-year smoking history. She has never used smokeless tobacco. She reports that she does not drink alcohol or use illicit drugs.  Allergies  Allergen Reactions  . Ativan [Lorazepam] Other (See Comments)    Severe hallucinations   . Propoxyphene Hcl     Rxn: unknown    Family History  Problem Relation Age of Onset  . Colon cancer Sister     colon  . Hyperlipidemia Mother   . Stroke Mother   . Cancer Father     mesothelioma    Prior to Admission medications   Medication Sig Start Date End Date Taking? Authorizing Provider  ALPRAZolam Prudy Feeler) 0.5 MG tablet Take 1 tablet (0.5 mg total) by mouth at bedtime as needed for  sleep. 05/15/13  Yes Stacie Glaze, MD  atorvastatin (LIPITOR) 10 MG tablet Take 1 tablet (10 mg total) by mouth daily at 6 PM. 06/23/13  Yes Maryann Mikhail, DO  bisoprolol (ZEBETA) 5 MG tablet Take 5 mg by mouth daily at 12 noon.    Yes Historical Provider, MD  Cyanocobalamin (VITAMIN B12 PO) Take 5,000 mcg by mouth daily.   Yes Historical Provider, MD  DULoxetine (CYMBALTA) 30 MG capsule Take 1 capsule (30 mg total) by mouth daily. 06/09/13  Yes Baker Pierini, FNP  levETIRAcetam (KEPPRA) 750 MG tablet Take 1 tablet (750 mg total) by mouth 2 (two) times daily. 06/23/13  Yes Maryann Mikhail, DO  loperamide (IMODIUM) 2 MG capsule Take 2 mg by mouth daily as needed for diarrhea or loose stools.    Yes Historical Provider, MD  neomycin-polymyxin b-dexamethasone (MAXITROL) 3.5-10000-0.1 SUSP Place 1 drop into the left eye every 6 (six) hours. 06/14/13  Yes Baker Pierini, FNP  Rivaroxaban (XARELTO) 15 MG TABS tablet Take 1 tablet (15 mg total) by mouth daily after supper. 06/23/13  Yes Maryann Mikhail, DO  Vitamin D, Ergocalciferol, (DRISDOL) 50000 UNITS CAPS Take 50,000 Units by mouth every Sunday.   Yes Historical Provider, MD   Physical Exam: Filed Vitals:   06/26/13 1600  BP: 147/65  Pulse: 81  Temp:   Resp:      General:  Alert and able to follow commands, afebrile, no resp distress. Patient with aphasia, but answering yes/no to questions  Eyes: PERRL, EOMI, no icterus, no nystagmus  ENT: dry MM, no erythema or exudate sinside her mouth, no drainage out of ears or nostrils  Neck: supple, no bruits  Cardiovascular: regular rate, no rubs or hallops  Respiratory: diffuse rhonchi, decrease BS at bases and no wheezing  Abdomen: soft, NT, ND, positive BS  Skin: no rash or petechiae  Musculoskeletal: no joint swelling or erythema  Psychiatric: flat affect, no hallucinations  Neurologic: Alert and awake, oriented X2; patient able to follow commands and answering  appropriately yes/no to questions. CN intact; residual aphasia present and unchanged.  Labs on Admission:  Basic Metabolic Panel:  Recent Labs Lab 06/21/13 1305 06/21/13 1335 06/22/13 1425  NA 140 143 139  K 4.2 4.1 3.8  CL 102 103 104  CO2 26  --  24  GLUCOSE 102* 105* 104*  BUN 12 12 10   CREATININE 1.03 1.30* 1.00  CALCIUM 10.3  --  9.5   Liver Function Tests:  Recent Labs Lab 06/21/13 1305  AST 30  ALT 25  ALKPHOS 75  BILITOT 0.6  PROT 8.0  ALBUMIN 4.3   CBC:  Recent Labs Lab 06/21/13 1305 06/21/13 1335 06/26/13 1458  WBC 5.5  --  2.6*  NEUTROABS 3.2  --  1.5*  HGB 14.2 13.9 7.2*  HCT 42.9 41.0 22.0*  MCV 89.6  --  90.2  PLT 177  --  97*   CBG:  Recent Labs Lab 06/22/13 1642 06/22/13 2210 06/23/13 0654 06/23/13 1114 06/26/13 1348  GLUCAP 95 98 91 96 100*    Radiological Exams on Admission: Dg Chest 2 View  06/26/2013   CLINICAL DATA:  Hypoxia.  COPD.  EXAM: CHEST  2 VIEW  COMPARISON:  06/08/2013.  FINDINGS: Mediastinum and hilar structures are normal. Patient has taken a poor inspiration with mild basilar atelectasis. Mild pneumonia in the lung bases cannot be excluded. COPD. No pleural effusion or pneumothorax. Cardiomegaly, no pulmonary venous congestion. Degenerative changes both shoulders. Degenerative changes thoracic spine.  IMPRESSION: 1. Poor inspiration with mild basilar atelectasis. Mild basilar pneumonia cannot be excluded. COPD. 2. Cardiomegaly.  No CHF.   Electronically Signed   By: Maisie Fus  Register   On: 06/26/2013 14:29   Ct Head Wo Contrast  06/26/2013   CLINICAL DATA:  Weakness, history of CVA.  EXAM: CT HEAD WITHOUT CONTRAST  TECHNIQUE: Contiguous axial images were obtained from the base of the skull through the vertex without intravenous contrast.  COMPARISON:  06/21/2013  FINDINGS: There is further evolution and decrease in density of the left-sided deep white matter infarct centered in the region of the corona radiata and  superior aspect of the basal ganglia. Advanced small vessel ischemic changes are noted in the periventricular white matter. The brain demonstrates no evidence of hemorrhage, new infarction, edema, mass effect, extra-axial fluid collection, hydrocephalus or mass lesion. The skull is unremarkable.  IMPRESSION: No new or acute findings. Further decrease in density of the left-sided deep white matter infarct centered in the corona radiata.   Electronically Signed   By: Irish Lack M.D.   On: 06/26/2013 15:03    Assessment/Plan 1-Acute respiratory failure with hypoxia: with CXR findings suggesting PNA. Patient recently in the hospital and with episodes of TIA vs seizure. Will treat as HCAP -admit to med-surg -pulmicort BID and flutter valve -will treat with vanc and zosyn (will cover HCAP and asp PNA) -supportive care -PRN oxygen -pharmacy to help with medication dose -will check legionella and strep antigen in urine -will follow Blood cx's  2-HYPERLIPIDEMIA: continue statins  3-ANEMIA-NOS: with Hgb of 7.2 on admission -will check FOBT -transfuse 2 units -if FOBT positive will need GI consult -continue PPI BID -for now given hx of recurrent strokes will continue xarelto -follow Hgb trend  4-ANXIETY and DEPRESSION: continue cymbalta and PRN alprazolam  5-HYPERTENSION: stable and well controlled. Will continue home regimen  6-Atrial fibrillation: rate controlled. Will continue xarelto and bisoprolol  7-COPD: stable. Will continue PRN albuterol and pulmicort. Overall good air movement  8-GERD:continue PPI BID  9-Focal seizure: continue keppra  10-Hx of ischemic multifocal multiple vascular territories stroke. -no new deficit appreciated -patient with residual aphasia -will ask PT to reevaluate as family is finding difficult to provide care at home and find patient to weak.   Code Status: Full Family Communication: husband and sister at bedside Disposition Plan: LOS > 2  midnights, inpatient; med-surg bed  Time spent: 55 minutes  Chellie Vanlue [facility] Pager [phone]  If 7PM-7AM, please contact night-coverage [email or web address] Password [facility] 06/26/2013, 4:37 PM

## 2013-06-26 NOTE — Progress Notes (Signed)
Received report from ED.  

## 2013-06-26 NOTE — Progress Notes (Signed)
Pt is being very uncooperative and pulling on tubes. Pt has removed her oxygen and is attempting to pull out her IV. RN to page MD on call for further orders.

## 2013-06-26 NOTE — Telephone Encounter (Signed)
Client is more obtunded today. Family cannot get her out of bed. Pt was walking up and down steps yesterday w/ assistance. Pt is fairly alert, totally disoriented. O2 sat 90 Pt did not urinated at all sat, now Wynelle Link and today she is flooding, peeing lots and lots w/out putting anything in. Pt is dehydrated. Nurse would like some orders. pls call. No way to get pt to office. Can't get to car.  Transferred pt to Triage right away

## 2013-06-26 NOTE — Progress Notes (Signed)
Pt is having some redness to her scab and hives to left side of face. Stopped antibiotics and to page md for further orders

## 2013-06-26 NOTE — Progress Notes (Signed)
Pt is being very uncooperative and combative. Pt won't allow nurse to get her 15 min vital signs for her blood transfusion. Pt already given prn dose of xanax. rn paged MD on call and is awaiting further orders.

## 2013-06-27 ENCOUNTER — Ambulatory Visit: Payer: Medicare Other

## 2013-06-27 ENCOUNTER — Ambulatory Visit: Payer: Medicare Other | Admitting: Physical Therapy

## 2013-06-27 DIAGNOSIS — J189 Pneumonia, unspecified organism: Principal | ICD-10-CM

## 2013-06-27 DIAGNOSIS — R1319 Other dysphagia: Secondary | ICD-10-CM

## 2013-06-27 DIAGNOSIS — Z515 Encounter for palliative care: Secondary | ICD-10-CM

## 2013-06-27 LAB — BASIC METABOLIC PANEL
BUN: 18 mg/dL (ref 6–23)
CO2: 24 mEq/L (ref 19–32)
Chloride: 97 mEq/L (ref 96–112)
Creatinine, Ser: 1.14 mg/dL — ABNORMAL HIGH (ref 0.50–1.10)
Glucose, Bld: 141 mg/dL — ABNORMAL HIGH (ref 70–99)

## 2013-06-27 LAB — CBC
MCHC: 34 g/dL (ref 30.0–36.0)
MCV: 88.3 fL (ref 78.0–100.0)
Platelets: 190 10*3/uL (ref 150–400)
RBC: 5.23 MIL/uL — ABNORMAL HIGH (ref 3.87–5.11)
WBC: 7.5 10*3/uL (ref 4.0–10.5)

## 2013-06-27 LAB — TYPE AND SCREEN
Antibody Screen: NEGATIVE
Unit division: 0
Unit division: 0

## 2013-06-27 LAB — HIV ANTIBODY (ROUTINE TESTING W REFLEX): HIV: NONREACTIVE

## 2013-06-27 MED ORDER — ACETAMINOPHEN 325 MG PO TABS: 650.0000 mg | ORAL_TABLET | Freq: Four times a day (QID) | ORAL | Status: DC | PRN

## 2013-06-27 MED ORDER — ALBUTEROL SULFATE (5 MG/ML) 0.5% IN NEBU: 2.5000 mg | INHALATION_SOLUTION | RESPIRATORY_TRACT | Status: DC | PRN

## 2013-06-27 MED ORDER — HYDRALAZINE HCL 20 MG/ML IJ SOLN
5.0000 mg | Freq: Four times a day (QID) | INTRAMUSCULAR | Status: DC | PRN
  Administered 2013-06-27: 5 mg via INTRAVENOUS
  Filled 2013-06-27: qty 1

## 2013-06-27 MED ORDER — VANCOMYCIN HCL 500 MG IV SOLR
500.0000 mg | Freq: Two times a day (BID) | INTRAVENOUS | Status: DC
  Filled 2013-06-27 (×2): qty 500

## 2013-06-27 MED ORDER — AMLODIPINE BESYLATE 5 MG PO TABS
5.0000 mg | ORAL_TABLET | Freq: Every day | ORAL | Status: DC
  Administered 2013-06-27 – 2013-06-30 (×4): 5 mg via ORAL
  Filled 2013-06-27 (×4): qty 1

## 2013-06-27 MED ORDER — ALBUTEROL SULFATE (5 MG/ML) 0.5% IN NEBU
2.5000 mg | INHALATION_SOLUTION | Freq: Three times a day (TID) | RESPIRATORY_TRACT | Status: DC
  Administered 2013-06-27 – 2013-06-28 (×3): 2.5 mg via RESPIRATORY_TRACT
  Filled 2013-06-27 (×3): qty 0.5

## 2013-06-27 NOTE — Clinical Social Work Note (Signed)
CSW has attempted to assess patient for SNF placement today. SNF list and CSW contact information left in patient's room. CSW will attempt to reach family by phone.   Roddie Mc, [place], [place], [phone]

## 2013-06-27 NOTE — Evaluation (Signed)
Clinical/Bedside Swallow Evaluation Patient Details  Name: ELANIE HAMMITT MRN: [id number] Date of Birth: [birth year]  Today's Date: 06/27/2013 Time: 1442-1500 SLP Time Calculation (min): 18 min  Past Medical History:  Past Medical History  Diagnosis Date  . GERD (gastroesophageal reflux disease)   . History of TIAs   . Diverticulosis   . PUD (peptic ulcer disease)   . PVD (peripheral vascular disease)   . Hyperlipidemia   . Anxiety   . Depression   . Blood transfusion     "w/1st miscarriage" (05/26/2013)  . COPD (chronic obstructive pulmonary disease)   . Hypertension   . PONV (postoperative nausea and vomiting)   . Family history of anesthesia complication     "sisters also get sick as a dog" (05/26/2013)  . Coronary artery disease   . Heart murmur     "when I was a child" (05/26/2013)  . DVT (deep venous thrombosis) 1954    "after I had my first child" (05/26/2013)  . Pneumonia 2013    "once" (05/26/2013)  . Exertional shortness of breath   . H/O hiatal hernia   . Stroke 10/2002    no residual  . Stroke 05/26/2013    "very mild; affected my speech for a while" (05/26/2013)  . Arthritis     "lots; hands" (05/26/2013)  . 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 Lymphona, mantle cell, inguinal region/lower limb     "large c-cell" (05/26/2013)   Past Surgical History:  Past Surgical History  Procedure Laterality Date  . 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 Tubal ligation  1972  . Radical neck dissection  1986    "larynx cancer" (05/26/2013)  . Total knee arthroplasty Bilateral     2002 left 2008 right  . Ileostomy closure  01/2010  . Cataract extraction  2005    bilateral  . Carotid endarterectomy Bilateral 2004    2004 a month apart right and left  . Tonsillectomy and adenoidectomy  1936    "tonsils grew back; no 2nd OR" (05/26/2013)  . Hernia repair   05/2010    ventral hernia repair  . Carpal tunnel release Left ?1960's  . Abdominal exploration surgery  12/01/2012  . Appendectomy  09/13/2008  . Colon surgery  09/13/2008    Laparoscopic assisted converted to open sigmoid colectomy.Hattie Perch 09/13/2008 (05/26/2013)  . Cataract extraction w/ intraocular lens  implant, bilateral Bilateral ~ 2003  . Dilation and curettage of uterus  1950's    "had 2 for miscarriages" (05/26/2013)   HPI:  Mrs. Liptak is a 79 yo female admitted to the hospital from home with hypoxia, acute respiratory failure, HCAP vs. Aspiration PNA, failure to thrive. She also has a history of stroke, TIAs, and seizure activity. She has had 5 hospital admissions in the past month. The family has described the past month as a drastic decline in functional status as she was able to walk, eat, leave the house and go shopping independently.  Pt has been evaluated for speech/language deficits during last several admissions.  She has not had a prior swallowing evaluation as she has had success in passing RN stroke swallow screens.     Assessment / Plan / Recommendation Clinical Impression  Pt presents with functional oropharyngeal swallow with adequate mastication, consistent swallow response, and no overt indications of airway compromise.  Pt with reduced spontaneity of verbal output compared to prior visits; required  encouragement to eat.  Recommend continued regular consistencies, thin liquids to liberalize food choices per her preferences.  No SLP f/u for swallowing needed.      Aspiration Risk  Mild    Diet Recommendation Regular;Thin liquid   Liquid Administration via: Cup;Straw Medication Administration: Whole meds with liquid Supervision: Staff to assist with self feeding    Other  Recommendations Oral Care Recommendations: Oral care BID     Swallow Study Prior Functional Status       General Date of Onset: 06/26/13 HType of Study: Bedside swallow evaluation Previous  Swallow Assessment: none Diet Prior to this Study: Regular;Thin liquids Temperature Spikes Noted: No Respiratory Status: Nasal cannula Behavior/Cognition: Alert Oral Cavity - Dentition: Dentures, top;Dentures, bottom Self-Feeding Abilities: Able to feed self;Needs assist Patient Positioning: Upright in bed Baseline Vocal Quality: Clear Volitional Cough: Weak Volitional Swallow: Able to elicit    Oral/Motor/Sensory Function Overall Oral Motor/Sensory Function: Appears within functional limits for tasks assessed   Ice Chips Ice chips: Within functional limits   Thin Liquid Thin Liquid: Within functional limits Presentation: Straw    Nectar Thick Nectar Thick Liquid: Not tested   Honey Thick Honey Thick Liquid: Not tested   Puree Puree: Within functional limits   Solid  Hortence Charter L. [place], [place] CCC/SLP Pager [phone]     Solid: Within functional limits       Blenda Mounts Laurice 06/27/2013,3:16 PM

## 2013-06-27 NOTE — Clinical Social Work Psychosocial (Signed)
Clinical Social Work Department BRIEF PSYCHOSOCIAL ASSESSMENT 06/27/2013  Patient:  [name], [name]     Account Number:  [id number]     Admit date:  06/26/2013  Clinical Social Worker:  Lavell Luster  Date/Time:  06/27/2013 01:00 PM  Referred by:  Physician  Date Referred:  06/27/2013 Referred for  SNF Placement   Other Referral:   Interview type:  Family Other interview type:   Patient not oriented at time of assessment. Husband was interviewed.    PSYCHOSOCIAL DATA Living Status:  HUSBAND Admitted from facility:   Level of care:   Primary support name:  [name] [phone] Primary support relationship to patient:  SPOUSE Degree of support available:   Support is strong. Patient has several family members at bedside.    CURRENT CONCERNS Current Concerns  Post-Acute Placement   Other Concerns:    SOCIAL WORK ASSESSMENT / PLAN CSW spoke with husband via phone to discuss recommendation for SNF placement and to explain process. Patient is currently being considered for CIR vs SNF, or home with HHPT services if the family chooses this option. Husband is agreeable to looking at SNF options in [facility]. The family would have a preference for [facility]. Patient is from home with husband, which is not the most ideal disposition for patient, given the husbands inability to adequately care for patient.   Assessment/plan status:  Psychosocial Support/Ongoing Assessment of Needs Other assessment/ plan:   Complete FL2, Fax, PASRR   Information/referral to community resources:   CSW contact information and SNF list given to husband.    PATIENT'S/FAMILY'S RESPONSE TO PLAN OF CARE: Patient's husband is agreeable to looking at SNF options in [facility]. Patient's husband was pleasant and appreciative of CSW contact. Family awaits bed offers. CSW will continue to follow.       Roddie Mc, [place], [place], [phone]

## 2013-06-27 NOTE — Clinical Social Work Placement (Signed)
Clinical Social Work Department CLINICAL SOCIAL WORK PLACEMENT NOTE 06/27/2013  Patient:  [name], [name]  Account Number:  [id number] Admit date:  06/26/2013  Clinical Social Worker:  Cherre Blanc, [place]  Date/time:  06/27/2013 04:05 PM  Clinical Social Work is seeking post-discharge placement for this patient at the following level of care:   SKILLED NURSING   (*CSW will update this form in Epic as items are completed)   06/27/2013  Patient/family provided with [facility] Department of Clinical Social Work's list of facilities offering this level of care within the geographic area requested by the patient (or if unable, by the patient's family).  06/27/2013  Patient/family informed of their freedom to choose among providers that offer the needed level of care, that participate in Medicare, Medicaid or managed care program needed by the patient, have an available bed and are willing to accept the patient.  06/27/2013  Patient/family informed of [facility]' ownership interest in [facility], as well as of the fact that they are under no obligation to receive care at this facility.  PASARR submitted to EDS on 06/27/2013 PASARR number received from EDS on 06/27/2013  FL2 transmitted to all facilities in geographic area requested by pt/family on  06/27/2013 FL2 transmitted to all facilities within larger geographic area on   Patient informed that his/her managed care company has contracts with or will negotiate with  certain facilities, including the following:     Patient/family informed of bed offers received:   Patient chooses bed at  Physician recommends and patient chooses bed at    Patient to be transferred to  on   Patient to be transferred to facility by   The following physician request were entered in Epic:   Additional Comments:   Roddie Mc, [place], [place], [phone]

## 2013-06-27 NOTE — Care Management Note (Unsigned)
    Page 1 of 1   06/27/2013     3:29:01 PM   CARE MANAGEMENT NOTE 06/27/2013  Patient:  [name], [name]   Account Number:  [id number]  Date Initiated:  06/27/2013  Documentation initiated by:  Letha Cape  Subjective/Objective Assessment:   dx acute respiratory failure  admit- lives with spouse.     Action/Plan:   Palliative meeting 11/25  pt eval- rec CIR vs SNF.   Anticipated DC Date:  06/29/2013   Anticipated DC Plan:  IP REHAB FACILITY      DC Planning Services  CM consult      PAC Choice  IP REHAB   Choice offered to / List presented to:             Status of service:  In process, will continue to follow Medicare Important Message given?   (If response is "NO", the following Medicare IM given date fields will be blank) Date Medicare IM given:   Date Additional Medicare IM given:    Discharge Disposition:    Per UR Regulation:  Reviewed for med. necessity/level of care/duration of stay  If discussed at Long Length of Stay Meetings, dates discussed:    Comments:  06/27/13 15:26 Letha Cape RN, BSN [phone] patient lives with spouse, per physical therapy recs CIR vs SNF.  CIR has been consulted.  Palliative has also seen patient which patient wants CIR as well.  CSW following for SNF as well.   Await CIR recommendations.  NCM will continue to follow for dc needs.

## 2013-06-27 NOTE — ED Provider Notes (Signed)
Medical screening examination/treatment/procedure(s) were conducted as a shared visit with non-physician practitioner(s) and myself.  I personally evaluated the patient during the encounter.  EKG Interpretation    Date/Time:  Monday June 26 2013 12:58:18 EST Ventricular Rate:  81 PR Interval:  172 QRS Duration: 90 QT Interval:  407 QTC Calculation: 472 R Axis:   48 Text Interpretation:  Sinus rhythm Confirmed by Gwendolyn Grant  MD, Orla Estrin ([id number]) on 06/26/2013 4:26:39 PM             10F here with weakness. Patient was recently hospitalized for TIA. Here satting 91% on room air. Family in room states some altered mental status here - patient will answer some questions appropriately, daughter states this is not her baseline. Lungs with diffuse rhonchi, no respiratory distress. CXR with questionable pneumonia. Will treat with HCAP coverage, admitted to medicine.  Dagmar Hait, MD 06/27/13 [phone]

## 2013-06-27 NOTE — Progress Notes (Signed)
Pt is being very uncooperative and combative. Pt won't allow nurse to get her 15 min vital signs for her blood transfusion. Pt already given prn dose of xanax. rn paged MD on call and is awaiting further orders. Spoke with MD on call Magick-Myers who stated to stop the pt's blood and to give in the am when pt is more alert. RN Nelda Marseille stopped the pt's blood. VS were taken and have been charted.

## 2013-06-27 NOTE — Consult Note (Signed)
Physical Medicine and Rehabilitation Consult  Reason for Consult: FTT/ Deconditioning  Referring Physician:  Dr. Jerral Ralph.    HPI: [name] is a 79 y.o. female with history of A fib, multiple recent admission for MS changes and speech difficulty due to CVA as well as new onset of seizures. She was recently discharged to home on 06/23/13 but readmitted on  11/24 with increasing weakness with inability to get out of bed, productive cough, hypoxia and SOB. CXR with bibasilar changes and patient was started on IV antibiotics for suspicion of Aspiration PNA. Patient continues to be confused with question of encephalopathy v/s delirium. Palliative care consulted to discuss goals of care. Family wants aggressive treatment MD, PT recommending CIR.    Review of Systems  Respiratory: Positive for shortness of breath. Negative for cough.   Cardiovascular: Negative for chest pain and palpitations.  Gastrointestinal: Negative for heartburn and abdominal pain.  Musculoskeletal: Negative for myalgias.  Neurological: Negative for headaches.   Past Medical History  Diagnosis Date  . GERD (gastroesophageal reflux disease)   . History of TIAs   . Diverticulosis   . PUD (peptic ulcer disease)   . PVD (peripheral vascular disease)   . Hyperlipidemia   . Anxiety   . Depression   . Blood transfusion     "w/1st miscarriage" (05/26/2013)  . COPD (chronic obstructive pulmonary disease)   . Hypertension   . PONV (postoperative nausea and vomiting)   . Family history of anesthesia complication     "sisters also get sick as a dog" (05/26/2013)  . Coronary artery disease   . Heart murmur     "when I was a child" (05/26/2013)  . DVT (deep venous thrombosis) 1954    "after I had my first child" (05/26/2013)  . Pneumonia 2013    "once" (05/26/2013)  . Exertional shortness of breath   . H/O hiatal hernia   . Stroke 10/2002    no residual  . Stroke 05/26/2013    "very mild; affected my speech for a  while" (05/26/2013)  . Arthritis     "lots; hands" (05/26/2013)  . 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 Lymphona, mantle cell, inguinal region/lower limb     "large c-cell" (05/26/2013)   Past Surgical History  Procedure Laterality Date  . 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 Tubal ligation  1972  . Radical neck dissection  1986    "larynx cancer" (05/26/2013)  . Total knee arthroplasty Bilateral     2002 left 2008 right  . Ileostomy closure  01/2010  . Cataract extraction  2005    bilateral  . Carotid endarterectomy Bilateral 2004    2004 a month apart right and left  . Tonsillectomy and adenoidectomy  1936    "tonsils grew back; no 2nd OR" (05/26/2013)  . Hernia repair  05/2010    ventral hernia repair  . Carpal tunnel release Left ?1960's  . Abdominal exploration surgery  12/01/2012  . Appendectomy  09/13/2008  . Colon surgery  09/13/2008    Laparoscopic assisted converted to open sigmoid colectomy.Hattie Perch 09/13/2008 (05/26/2013)  . Cataract extraction w/ intraocular lens  implant, bilateral Bilateral ~ 2003  . Dilation and curettage of uterus  1950's    "had 2 for miscarriages" (05/26/2013)   Family History  Problem Relation Age of Onset  . Colon cancer Sister     colon  .  Hyperlipidemia Mother   . Stroke Mother   . Cancer Father     mesothelioma   Social History:  Married. Needed SBA for mobility due to unsteady gait. Per  reports that she quit smoking about 28 years ago. Her smoking use included Cigarettes. She has a 92.5 pack-year smoking history. She has never used smokeless tobacco. Per reports that she does not drink alcohol or use illicit drugs.   Allergies  Allergen Reactions  . Ativan [Lorazepam] Other (See Comments)    Severe hallucinations   . Propoxyphene Hcl     Rxn: unknown    Medications Prior to Admission  Medication Sig Dispense Refill  .  ALPRAZolam (XANAX) 0.5 MG tablet Take 1 tablet (0.5 mg total) by mouth at bedtime as needed for sleep.  30 tablet  3  . atorvastatin (LIPITOR) 10 MG tablet Take 1 tablet (10 mg total) by mouth daily at 6 PM.  30 tablet  0  . bisoprolol (ZEBETA) 5 MG tablet Take 5 mg by mouth daily at 12 noon.       . Cyanocobalamin (VITAMIN B12 PO) Take 5,000 mcg by mouth daily.      . DULoxetine (CYMBALTA) 30 MG capsule Take 1 capsule (30 mg total) by mouth daily.  30 capsule  0  . levETIRAcetam (KEPPRA) 750 MG tablet Take 1 tablet (750 mg total) by mouth 2 (two) times daily.  60 tablet  0  . loperamide (IMODIUM) 2 MG capsule Take 2 mg by mouth daily as needed for diarrhea or loose stools.       Marland Kitchen neomycin-polymyxin b-dexamethasone (MAXITROL) 3.5-10000-0.1 SUSP Place 1 drop into the left eye every 6 (six) hours.  1 Bottle  0  . Rivaroxaban (XARELTO) 15 MG TABS tablet Take 1 tablet (15 mg total) by mouth daily after supper.  30 tablet  0  . Vitamin D, Ergocalciferol, (DRISDOL) 50000 UNITS CAPS Take 50,000 Units by mouth every Sunday.        Home: Home Living Family/patient expects to be discharged to:: Private residence Living Arrangements: Spouse/significant other Available Help at Discharge: Available 24 hours/day;Family (3 daughters live locally) Type of Home: House (townhouse) Home Access: Level entry Home Layout: Multi-level;1/2 bath on main level Alternate Level Stairs-Number of Steps: 13 Alternate Level Stairs-Rails: Can reach both (both 1/2 way, and then wall on right, and railing on left go) Home Equipment: Walker - 2 wheels;Bedside commode;Shower seat  Lives With: Spouse  Functional History: Prior Function Comments: pt  Functional Status:  Mobility: Bed Mobility Bed Mobility: Rolling Right;Rolling Left;Supine to Sit;Sit to Supine Rolling Right: 2: Max assist;3: Mod assist;With rail Rolling Left: 3: Mod assist;2: Max assist;With rail Supine to Sit: 3: Mod assist;2: Max assist Sit to Supine:  1: +2 Total assist Transfers Transfers: Not assessed Ambulation/Gait Ambulation/Gait Assistance: Not tested (comment)    ADL:    Cognition: Cognition Overall Cognitive Status: Impaired/Different from baseline Orientation Level: Oriented to person;Oriented to place;Oriented to time;Disoriented to situation Cognition Arousal/Alertness: Lethargic Behavior During Therapy: Flat affect Overall Cognitive Status: Impaired/Different from baseline Area of Impairment: Orientation;Attention;Following commands;Safety/judgement;Problem solving Following Commands: Follows one step commands inconsistently Safety/Judgement: Decreased awareness of safety;Decreased awareness of deficits Problem Solving: Slow processing;Decreased initiation General Comments: pt had been incontinent of urine in the bed, but did not let me know   Blood pressure 97/63, pulse 87, temperature 97.7 F (36.5 C), temperature source Oral, resp. rate 18, height 5\' 4"  (1.626 m), weight 84.188 kg (185 lb 9.6 oz), SpO2 98.00%.  Physical Exam  Nursing note and vitals reviewed. Constitutional: She appears well-developed and well-nourished.  HENT:  Head: Atraumatic.  Eyes: Conjunctivae are normal. Pupils are equal, round, and reactive to light.  Neck: Normal range of motion. Neck supple.  Cardiovascular: Normal rate and regular rhythm.   Respiratory: Effort normal and breath sounds normal. No respiratory distress.  GI: Soft. Bowel sounds are normal. She exhibits no distension.  Musculoskeletal: She exhibits no edema and no tenderness.  Neurological: She is alert.  Oriented to self and place. Speech slow but clear. Able to follow basic commands without difficulty.   Skin: Skin is warm and dry.   motor strength is 4/5 bilateral deltoid, bicep, tricep, grip, hip flexion, knee extension, ankle dorsi flexion  Cerebellar finger nose to finger testing is slow but intact bilateral upper extremities Sensory intact to light touch in  bilateral upper and lower tremor these  Results for orders placed during the hospital encounter of 06/26/13 (from the past 24 hour(s))  CBC     Status: None   Collection Time    06/28/13  5:25 AM      Result Value Range   WBC 7.3  4.0 - 10.5 K/uL   RBC 4.85  3.87 - 5.11 MIL/uL   Hemoglobin 14.5  12.0 - 15.0 g/dL   HCT 16.1  09.6 - 04.5 %   MCV 89.9  78.0 - 100.0 fL   MCH 29.9  26.0 - 34.0 pg   MCHC 33.3  30.0 - 36.0 g/dL   RDW 40.9  81.1 - 91.4 %   Platelets 185  150 - 400 K/uL   Dg Chest 2 View  06/26/2013   CLINICAL DATA:  Hypoxia.  COPD.  EXAM: CHEST  2 VIEW  COMPARISON:  06/08/2013.  FINDINGS: Mediastinum and hilar structures are normal. Patient has taken a poor inspiration with mild basilar atelectasis. Mild pneumonia in the lung bases cannot be excluded. COPD. No pleural effusion or pneumothorax. Cardiomegaly, no pulmonary venous congestion. Degenerative changes both shoulders. Degenerative changes thoracic spine.  IMPRESSION: 1. Poor inspiration with mild basilar atelectasis. Mild basilar pneumonia cannot be excluded. COPD. 2. Cardiomegaly.  No CHF.   Electronically Signed   By: Maisie Fus  Register   On: 06/26/2013 14:29   Ct Head Wo Contrast  06/26/2013   CLINICAL DATA:  Weakness, history of CVA.  EXAM: CT HEAD WITHOUT CONTRAST  TECHNIQUE: Contiguous axial images were obtained from the base of the skull through the vertex without intravenous contrast.  COMPARISON:  06/21/2013  FINDINGS: There is further evolution and decrease in density of the left-sided deep white matter infarct centered in the region of the corona radiata and superior aspect of the basal ganglia. Advanced small vessel ischemic changes are noted in the periventricular white matter. The brain demonstrates no evidence of hemorrhage, new infarction, edema, mass effect, extra-axial fluid collection, hydrocephalus or mass lesion. The skull is unremarkable.  IMPRESSION: No new or acute findings. Further decrease in density of  the left-sided deep white matter infarct centered in the corona radiata.   Electronically Signed   By: Irish Lack M.D.   On: 06/26/2013 15:03    Assessment/Plan: Diagnosis: Acute right corona radiata infarct as well as a subacute left subcortical infarct 1. Does the need for close, 24 hr/day medical supervision in concert with the patient's rehab needs make it unreasonable for this patient to be served in a less intensive setting? Yes 2. Co-Morbidities requiring supervision/potential complications: Pneumonia, atrial fibrillation, COPD, hypertension 3.  Due to bladder management, bowel management, safety, skin/wound care, disease management, medication administration, pain management and patient education, does the patient require 24 hr/day rehab nursing? Yes 4. Does the patient require coordinated care of a physician, rehab nurse, PT (1-2 hrs/day, 5 days/week), OT (1-2 hrs/day, 5 days/week) and SLP (0.5-1 hrs/day, 5 days/week) to address physical and functional deficits in the context of the above medical diagnosis(es)? Potentially Addressing deficits in the following areas: balance, endurance, locomotion, strength, transferring, bowel/bladder control, bathing, dressing, feeding, grooming, toileting, cognition, speech, language and swallowing 5. Can the patient actively participate in an intensive therapy program of at least 3 hrs of therapy per day at least 5 days per week? Yes 6. The potential for patient to make measurable gains while on inpatient rehab is good 7. Anticipated functional outcomes upon discharge from inpatient rehab are supervision mobility with PT, supervision ADLs with OT, 100% orientation with SLP. 8. Estimated rehab length of stay to reach the above functional goals is: 10-14 days 9. Does the patient have adequate social supports to accommodate these discharge functional goals? Potentially 10. Anticipated D/C setting: Home 11. Anticipated post D/C treatments: HH  therapy 12. Overall Rehab/Functional Prognosis: good  RECOMMENDATIONS: This patient's condition is appropriate for continued rehabilitative care in the following setting: CIR Patient has agreed to participate in recommended program. Yes Note that insurance prior authorization may be required for reimbursement for recommended care.  Comment:     06/28/2013

## 2013-06-27 NOTE — Consult Note (Signed)
Patient [name]      DOB: 02/25/1928      ION:[id number]     Consult Note from the Palliative Medicine Team at [facility]    Consult Requested by: dr [name]      PCP: Lisa Mew, MD Reason for Consultation: Clarification of GOC and options   Phone Number:[phone]  Assessment of patients Current state:  79 yo female with HCAP vs aspiration PNA, failure to thrive this admission with history of COPD, Atrial Fibrillation, stroke, seizures, hypertension.  Rapid decline over the past month, poor response to interventions, family faced with decisions regarding medical interventions.  Consult is for review of medical treatment options, clarification of goals of care and end of life issues, disposition and options, and symptom recommendation.  This NP [name] reviewed medical records, received report from team, assessed the patient and then meet at the patient's bedside along with her husband [name] [phone]), and three daughter [name] [phone]), [name] [phone]) and [name] [phone]) to discuss diagnosis prognosis, GOC,  Advanced directive topics, disposition,  and options.   A detailed discussion was had today regarding advanced directives.  Concepts specific to code status, artifical feeding and hydration, continued IV antibiotics and rehospitalization was had.  The difference between a aggressive medical intervention path  and a palliative comfort care path for this patient at this time was had.  Values and goals of care important to patient and family were attempted to be elicited.  Concept of Palliative Care were discussed  Natural trajectory and expectations of overall failure to thrive were discussed.  Questions and concerns addressed.  Hard Choices booklet left for review. Family encouraged to call with questions or concerns.  PMT will continue to support holistically.    Goals of Care:  1.  Code Status: Full- introduced options and the family will  discuss these.  2. Scope of Treatment: 1. Vital Signs: per unit    Hx AF with HR 70-90  2. Respiratory/Oxygen: as needed for treatment       [place] 2 lpm 3. Nutritional Support/Tube Feeds: SLP eval to be done. Family encouraged to offer frequent fluids and feeds. 4. Antibiotics: continue for treatment    Vancomycin and Zosyn IV for HCAP vs. Asp PNA 5. Blood Products: as needed for treatment 6. IVF: yes 7. Review of Medications to be discontinued: Cymbalto and Lipitor  per family request.  Concerned with polypharmacy.  Family requesting consideration of decreasing Keppra 8. Labs: yes 9. Telemetry: yes-  10.  Consults: PT, OT, SLP, palliative   4. Disposition: Family is hopeful for return to baseline.  Considering short term rehabilitation on discharge.   Hopeful for consideration for CIR  inpt rehab.   3. Symptom Management:   1. Anxiety/Agitation: Xanax po qhs prn. Family presence helps. 2. Weakness: PT, OT, eval for inpt rehab 3. Dysphagia: SLP eval to be completed. Change diet to chopped. Problems with dentures per family-pt would not want pureed diet but we will try chopped diet.  4. Psychosocial: Emotional support provided to patient and family.  Today is the first time all have had the difficult/realistic discussion regarding possibility of overall failure to thrive and mortality.    Patient Documents Completed or Given: Document Given Completed  Advanced Directives Pkt    MOST yes   DNR    Gone from My Sight    Hard Choices yes     Brief HPI: [name] is a 79 yo female admitted to the hospital from home  with hypoxia, acute respiratory failure, HCAP vs. Aspiration PNA, failure to thrive. She also has a history of stroke, TIAs, and seizure activity. She has had 5 hospital admissions in the past month. The family has described the past month as a drastic decline in functional status as she was able to walk, eat, leave the house and go shopping independently.    ROS:  unable to illicit due to decreased cognition    PMH:  Past Medical History  Diagnosis Date  . GERD (gastroesophageal reflux disease)   . History of TIAs   . Diverticulosis   . PUD (peptic ulcer disease)   . PVD (peripheral vascular disease)   . Hyperlipidemia   . Anxiety   . Depression   . Blood transfusion     "w/1st miscarriage" (05/26/2013)  . COPD (chronic obstructive pulmonary disease)   . Hypertension   . PONV (postoperative nausea and vomiting)   . Family history of anesthesia complication     "sisters also get sick as a dog" (05/26/2013)  . Coronary artery disease   . Heart murmur     "when I was a child" (05/26/2013)  . DVT (deep venous thrombosis) 1954    "after I had my first child" (05/26/2013)  . Pneumonia 2013    "once" (05/26/2013)  . Exertional shortness of breath   . H/O hiatal hernia   . Stroke 10/2002    no residual  . Stroke 05/26/2013    "very mild; affected my speech for a while" (05/26/2013)  . Arthritis     "lots; hands" (05/26/2013)  . 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 Lymphona, mantle cell, inguinal region/lower limb     "large c-cell" (05/26/2013)     PSH: Past Surgical History  Procedure Laterality Date  . 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 Tubal ligation  1972  . Radical neck dissection  1986    "larynx cancer" (05/26/2013)  . Total knee arthroplasty Bilateral     2002 left 2008 right  . Ileostomy closure  01/2010  . Cataract extraction  2005    bilateral  . Carotid endarterectomy Bilateral 2004    2004 a month apart right and left  . Tonsillectomy and adenoidectomy  1936    "tonsils grew back; no 2nd OR" (05/26/2013)  . Hernia repair  05/2010    ventral hernia repair  . Carpal tunnel release Left ?1960's  . Abdominal exploration surgery  12/01/2012  . Appendectomy  09/13/2008  . Colon surgery  09/13/2008     Laparoscopic assisted converted to open sigmoid colectomy.[name] 09/13/2008 (05/26/2013)  . Cataract extraction w/ intraocular lens  implant, bilateral Bilateral ~ 2003  . Dilation and curettage of uterus  1950's    "had 2 for miscarriages" (05/26/2013)   I have reviewed the FH and SH and  If appropriate update it with new information. Allergies  Allergen Reactions  . Ativan [Lorazepam] Other (See Comments)    Severe hallucinations   . Propoxyphene Hcl     Rxn: unknown   Scheduled Meds: . albuterol  2.5 mg Nebulization TID  . amLODipine  5 mg Oral Daily  . bisoprolol  5 mg Oral Q1200  . budesonide (PULMICORT) nebulizer solution  0.25 mg Nebulization BID  . levETIRAcetam  750 mg Oral BID  . neomycin-polymyxin b-dexamethasone  1 drop Left Eye Q6H  . pantoprazole  40 mg Oral BID  .  piperacillin-tazobactam (ZOSYN)  IV  3.375 g Intravenous Q8H  . Rivaroxaban  15 mg Oral QPC supper  . vancomycin  750 mg Intravenous Q12H   PRN Meds:.albuterol, ALPRAZolam, diphenhydrAMINE, hydrALAZINE    BP 168/98  Pulse 86  Temp(Src) 97.9 F (36.6 C) (Oral)  Resp 18  Ht 5\' 4"  (1.626 m)  Wt 84.188 kg (185 lb 9.6 oz)  BMI 31.84 kg/m2  SpO2 99%   PPS: 30 % at best   Intake/Output Summary (Last 24 hours) at 06/27/13 1218 Last data filed at 06/27/13 0945  Gross per 24 hour  Intake   12.5 ml  Output      0 ml  Net   12.5 ml   LBM: 06/26/13                       Physical Exam:  General: Awake, alert, confused. NAD. HEENT:  Buccal membranes  Dry,  Chest:   Symmetrical rise, no tachypnea or inc WOB CVS: RRR Abdomen: soft NT +BS Ext: Bilateral trace ankle edema Neuro: intermittent confusion, unable to follow commands  Labs: CBC    Component Value Date/Time   WBC 7.5 06/27/2013 0640   WBC 6.7 09/05/2012 0938   RBC 5.23* 06/27/2013 0640   RBC 4.30 09/05/2012 0938   HGB 15.7* 06/27/2013 0640   HGB 12.4 09/05/2012 0938   HCT 46.2* 06/27/2013 0640   HCT 39.4 09/05/2012 0938   PLT 190  06/27/2013 0640   PLT 151 09/05/2012 0938   MCV 88.3 06/27/2013 0640   MCV 91.6 09/05/2012 0938   MCH 30.0 06/27/2013 0640   MCH 28.8 09/05/2012 0938   MCHC 34.0 06/27/2013 0640   MCHC 31.5 09/05/2012 0938   RDW 13.6 06/27/2013 0640   RDW 14.3 09/05/2012 0938   LYMPHSABS 0.6* 06/26/2013 1458   LYMPHSABS 0.8* 09/05/2012 0938   MONOABS 0.3 06/26/2013 1458   MONOABS 0.6 09/05/2012 0938   EOSABS 0.1 06/26/2013 1458   EOSABS 0.2 09/05/2012 0938   BASOSABS 0.0 06/26/2013 1458   BASOSABS 0.0 09/05/2012 0938    BMET    Component Value Date/Time   NA 139 06/27/2013 0640   NA 139 07/06/2012 0949   K 3.4* 06/27/2013 0640   K 4.4 07/06/2012 0949   CL 97 06/27/2013 0640   CL 105 07/06/2012 0949   CO2 24 06/27/2013 0640   CO2 21* 07/06/2012 0949   GLUCOSE 141* 06/27/2013 0640   GLUCOSE 118* 07/06/2012 0949   BUN 18 06/27/2013 0640   BUN 11.0 07/06/2012 0949   CREATININE 1.14* 06/27/2013 0640   CREATININE 1.0 07/06/2012 0949   CALCIUM 10.3 06/27/2013 0640   CALCIUM 9.3 07/06/2012 0949   GFRNONAA 43* 06/27/2013 0640   GFRAA 50* 06/27/2013 0640    CMP     Component Value Date/Time   NA 139 06/27/2013 0640   NA 139 07/06/2012 0949   K 3.4* 06/27/2013 0640   K 4.4 07/06/2012 0949   CL 97 06/27/2013 0640   CL 105 07/06/2012 0949   CO2 24 06/27/2013 0640   CO2 21* 07/06/2012 0949   GLUCOSE 141* 06/27/2013 0640   GLUCOSE 118* 07/06/2012 0949   BUN 18 06/27/2013 0640   BUN 11.0 07/06/2012 0949   CREATININE 1.14* 06/27/2013 0640   CREATININE 1.0 07/06/2012 0949   CALCIUM 10.3 06/27/2013 0640   CALCIUM 9.3 07/06/2012 0949   PROT 8.0 06/21/2013 1305   PROT 6.5 07/06/2012 0949   ALBUMIN 4.3 06/21/2013 1305  ALBUMIN 3.9 07/06/2012 0949   AST 30 06/21/2013 1305   AST 26 07/06/2012 0949   ALT 25 06/21/2013 1305   ALT 24 07/06/2012 0949   ALKPHOS 75 06/21/2013 1305   ALKPHOS 74 07/06/2012 0949   BILITOT 0.6 06/21/2013 1305   BILITOT 0.51 07/06/2012 0949   GFRNONAA 43* 06/27/2013 0640   GFRAA 50* 06/27/2013 0640     Time In Time Out Total Time Spent with Patient Total Overall Time  1020 1200 85 min 100 min    Greater than 50%  of this time was spent counseling and coordinating care related to the above assessment and plan.  Lisa Creed NP  Palliative Medicine Team Team Phone # [phone] Pager [phone]  Discussed with Dr [name]

## 2013-06-27 NOTE — Progress Notes (Signed)
ANTIBIOTIC CONSULT NOTE - INITIAL  Pharmacy Consult:  Pharmacy to renally adjust antibiotics  Allergies  Allergen Reactions  . Ativan [Lorazepam] Other (See Comments)    Severe hallucinations   . Propoxyphene Hcl     Rxn: unknown    Patient Measurements: Height: 5\' 4"  (162.6 cm) Weight: 185 lb 9.6 oz (84.188 kg) IBW/kg (Calculated) : 54.7  Vital Signs: Temp: 98.4 F (36.9 C) (11/25 1337) Temp src: Oral (11/25 1337) BP: 107/62 mmHg (11/25 1337) Pulse Rate: 99 (11/25 1337)  Labs:  Recent Labs  06/26/13 1458 06/26/13 1930 06/27/13 0640  WBC 2.6* 4.5 7.5  HGB 7.2* 12.6 15.7*  PLT 97* 159 190  CREATININE  --   --  1.14*   Estimated Creatinine Clearance: 39.3 ml/min (by C-G formula based on Cr of 1.14). No results found for this basename: VANCOTROUGH, VANCOPEAK, VANCORANDOM, GENTTROUGH, GENTPEAK, GENTRANDOM, TOBRATROUGH, TOBRAPEAK, TOBRARND, AMIKACINPEAK, AMIKACINTROU, AMIKACIN,  in the last 72 hours   Microbiology: Recent Results (from the past 720 hour(s))  CULTURE, BLOOD (ROUTINE X 2)     Status: None   Collection Time    06/26/13  4:15 PM      Result Value Range Status   Specimen Description BLOOD HAND LEFT   Final   Special Requests BOTTLES DRAWN AEROBIC ONLY 5CC   Final   Culture  Setup Time     Final   Value: 06/26/2013 21:18     Performed at [facility]   Culture     Final   Value:        BLOOD CULTURE RECEIVED NO GROWTH TO DATE CULTURE WILL BE HELD FOR 5 DAYS BEFORE ISSUING A FINAL NEGATIVE REPORT     Performed at [facility]   Report Status PENDING   Incomplete    Medical History: Past Medical History  Diagnosis Date  . GERD (gastroesophageal reflux disease)   . History of TIAs   . Diverticulosis   . PUD (peptic ulcer disease)   . PVD (peripheral vascular disease)   . Hyperlipidemia   . Anxiety   . Depression   . Blood transfusion     "w/1st miscarriage" (05/26/2013)  . COPD (chronic obstructive pulmonary disease)   .  Hypertension   . PONV (postoperative nausea and vomiting)   . Family history of anesthesia complication     "sisters also get sick as a dog" (05/26/2013)  . Coronary artery disease   . Heart murmur     "when I was a child" (05/26/2013)  . DVT (deep venous thrombosis) 1954    "after I had my first child" (05/26/2013)  . Pneumonia 2013    "once" (05/26/2013)  . Exertional shortness of breath   . H/O hiatal hernia   . Stroke 10/2002    no residual  . Stroke 05/26/2013    "very mild; affected my speech for a while" (05/26/2013)  . Arthritis     "lots; hands" (05/26/2013)  . 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 Lymphona, mantle cell, inguinal region/lower limb     "large c-cell" (05/26/2013)      Assessment: 85 YOF recently discharged on 06/23/13 post TIA/stroke admission.  Patient presented 06/26/13 with increased SOB, coughing and generalized weakness.  Found to have PNA and Pharmacy consulted to renally adjust antibiotics (vancomycin and Zosyn).  SCr has trended up slightly to 1.14.  WBC 7.5, afebrile   Vancomycin Kinetics with CrCl~ 39 mL/min Ke =  0.037 Vd= 60.6L T1/2 = 19 hours  Goal of Therapy:  Vancomycin trough level 15-20 mcg/ml   Plan:  - Change Vancomycin to 500 mg IV Q 12 hours  - Zosyn 3.375gm IV Q8H, 4 hr infusion - Monitor renal fxn, clinical course, vanc trough as indicated - Monitor closely for s/sx of bleeding while on Xarelto   Vinnie Level, PharmD.  Clinical Pharmacist Pager [phone]

## 2013-06-27 NOTE — Evaluation (Signed)
Physical Therapy Evaluation Patient Details Name: [name] MRN: [id number] DOB: 07-Apr-1928 Today's Date: 06/27/2013 Time: 8295-6213 PT Time Calculation (min): 32 min  PT Assessment / Plan / Recommendation History of Present Illness   SHUNTEL FISHBURN is a 79 y.o. female with PMH of stroke and frequent admissions for TIAs, recent seizure, hypertension hyperlipidemia, COPD, atrial fibrillation requiring anticoagulation that presents emergency room complaining of generalized weakness, increased SOB and non productive cough.  Husband reports about 4 admissions in past month with pt becoming progressively weaker especially on right side.  Clinical Impression  Pt lethargic on evaluation,but she was able to participate with bed mobility and supine <> sit. Will try to optimize treatment time with meds to provide pt best opportunity for functional mobility    PT Assessment  Patient needs continued PT services    Follow Up Recommendations  CIR;Supervision/Assistance - 24 hour ( CIR vs SNF, HHPT if family wants to take her home)    Does the patient have the potential to tolerate intense rehabilitation      Barriers to Discharge        Equipment Recommendations  None recommended by PT    Recommendations for Other Services Rehab consult   Frequency Min 3X/week    Precautions / Restrictions Precautions Precautions: Fall Restrictions Weight Bearing Restrictions: No   Pertinent Vitals/Pain No indication of pain      Mobility  Bed Mobility Bed Mobility: Rolling Right;Rolling Left;Supine to Sit;Sit to Supine Rolling Right: 2: Max assist;3: Mod assist;With rail Rolling Left: 3: Mod assist;2: Max assist;With rail Supine to Sit: 3: Mod assist;2: Max assist Sit to Supine: 1: +2 Total assist Details for Bed Mobility Assistance: pt needs extra time and assis to get into position, but is able to hold position once she is there.  Transfers Transfers: Not  assessed Ambulation/Gait Ambulation/Gait Assistance: Not tested (comment)    Exercises     PT Diagnosis: Difficulty walking;Generalized weakness;Hemiplegia dominant side  PT Problem List: Decreased strength;Decreased range of motion;Decreased activity tolerance;Decreased balance;Decreased mobility;Decreased knowledge of use of DME;Decreased safety awareness;Pain;Decreased cognition PT Treatment Interventions: DME instruction;Gait training;Stair training;Functional mobility training;Therapeutic activities;Therapeutic exercise;Neuromuscular re-education;Patient/family education     PT Goals(Current goals can be found in the care plan section) Acute Rehab PT Goals Patient Stated Goal: family wants pt to get stronger PT Goal Formulation: With patient/family Time For Goal Achievement: 07/11/13 Potential to Achieve Goals: Good  Visit Information  Last PT Received On: 06/27/13 Assistance Needed: +2 History of Present Illness:  [name] is a 79 y.o. female with PMH of stroke and frequent admissions for TIAs, recent seizure, hypertension hyperlipidemia, COPD, atrial fibrillation requiring anticoagulation that presents emergency room complaining of generalized weakness, increased SOB and non productive cough.  Husband reports about 4 admissions in past month with pt becoming progressively weaker especially on right side.       Prior Functioning  Home Living Family/patient expects to be discharged to:: Private residence Living Arrangements: Spouse/significant other Available Help at Discharge: Available 24 hours/day;Family (3 daughters live locally) Type of Home: House (townhouse) Home Access: Level entry Home Layout: Multi-level;1/2 bath on main level Alternate Level Stairs-Number of Steps: 13 Alternate Level Stairs-Rails: Can reach both (both 1/2 way, and then wall on right, and railing on left go) Home Equipment: Walker - 2 wheels;Bedside commode;Shower seat  Lives With:  Spouse Prior Function Level of Independence: Needs assistance Gait / Transfers Assistance Needed: pt needing progressively more assit Comments: pt  Communication Communication: Expressive  difficulties Dominant Hand: Right    Cognition  Cognition Arousal/Alertness: Lethargic Behavior During Therapy: Flat affect Overall Cognitive Status: Impaired/Different from baseline Area of Impairment: Orientation;Attention;Following commands;Safety/judgement;Problem solving Following Commands: Follows one step commands inconsistently Safety/Judgement: Decreased awareness of safety;Decreased awareness of deficits Problem Solving: Slow processing;Decreased initiation General Comments: pt had been incontinent of urine in the bed, but did not let me know     Extremity/Trunk Assessment Upper Extremity Assessment Upper Extremity Assessment: RUE deficits/detail RUE Deficits / Details: pt was unable to raise her arm against gravity to command but was obeserved to move it randomly Lower Extremity Assessment Lower Extremity Assessment: RLE deficits/detail RLE Deficits / Details: Obeserved random movements and used leg to help with rolling, but pt did not follow commands to movel leg Cervical / Trunk Assessment Cervical / Trunk Assessment: Normal;Other exceptions Cervical / Trunk Exceptions: generalized weakness   Balance Balance Balance Assessed: Yes Static Sitting Balance Static Sitting - Balance Support: Feet supported;Bilateral upper extremity supported Static Sitting - Level of Assistance: 4: Min assist Static Sitting - Comment/# of Minutes: pt woke up a little more and could support herself in sitting.  Extra support needed for safety  End of Session PT - End of Session Equipment Utilized During Treatment: Oxygen Activity Tolerance: Patient limited by lethargy Patient left: in bed;with call bell/phone within reach;with family/visitor present  GP    Rosey Bath K. Manson Passey, [place] [phone] 06/27/2013,  11:07 AM

## 2013-06-27 NOTE — Progress Notes (Signed)
PATIENT DETAILS Name: [name] Age: 79 y.o. Sex: female Date of Birth: 12-01-1927 Admit Date: 06/26/2013 Admitting Physician No admitting provider for patient encounter. ZOX:[id number],UJWJ EDWARD, MD  Subjective: Weak-but mostly alert-follows commands-daughter at bedside  Assessment/Plan: Principal Problem:   Acute respiratory failure with hypoxia -better, suspected multifactorial from probable Aspiration PNA/COPD -treat above, and continue to O2 as needed  Active Problems: HCAP vs Asp PNA -c/w Vanco/Zosyn day 2 -SLP eval -Blood culture-11/24-neg  ?Anemia -Hb of 7.2 on admit-however Hb 12.6 on repeat yesterday and 15.7 today-PRBC  transfusionstopped yesterday-as patient became combative-no overt blood loss-per daughter no bowel movements in the past 4 days -hold of on PRBC transfusion, continue with Xarelto and follow Hb trend and clinical course  Atrial fibrillation: - rate controlled. Will continue xarelto and bisoprolol  HYPERTENSION -continue Bisoprolol -add amlodipine -follow BP trend  COPD:  -stable. - continue albuterol and pulmicort.   Hx of seizures -No seizures noted-on Keppra-continue  Hx of recurrent Embolic CVA -c/w Xarelto, Statins -with residual aphasia-but follows some commands  GERD: -continue PPI BID  Failure to Thrive -multiple recent admission-no non verbal, very minimal ambulation over the past few days, poor appetitie -SLP, PT eval -Palliative eval -suspect will require SNF on discharge-await further recommendations from Palliative care  Disposition: Remain inpatient  DVT Prophylaxis: On Xarelto  Code Status: Full code  Family Communication Daughter at bedside  Procedures:  None  CONSULTS:  None   MEDICATIONS: Scheduled Meds: . atorvastatin  10 mg Oral q1800  . bisoprolol  5 mg Oral Q1200  . budesonide (PULMICORT) nebulizer solution  0.25 mg Nebulization BID  . DULoxetine  30 mg Oral Daily  .  levETIRAcetam  750 mg Oral BID  . neomycin-polymyxin b-dexamethasone  1 drop Left Eye Q6H  . pantoprazole  40 mg Oral BID  . piperacillin-tazobactam (ZOSYN)  IV  3.375 g Intravenous Q8H  . Rivaroxaban  15 mg Oral QPC supper  . vancomycin  750 mg Intravenous Q12H   Continuous Infusions:  PRN Meds:.albuterol, ALPRAZolam, diphenhydrAMINE, hydrALAZINE  Antibiotics: Anti-infectives   Start     Dose/Rate Route Frequency Ordered Stop   06/27/13 0600  vancomycin (VANCOCIN) IVPB 750 mg/150 ml premix     750 mg 150 mL/hr over 60 Minutes Intravenous Every 12 hours 06/26/13 1743     06/27/13 0315  vancomycin (VANCOCIN) IVPB 1000 mg/200 mL premix  Status:  Discontinued     1,000 mg 200 mL/hr over 60 Minutes Intravenous Every 12 hours 06/26/13 1702 06/26/13 1742   06/26/13 2200  piperacillin-tazobactam (ZOSYN) IVPB 3.375 g  Status:  Discontinued     3.375 g 12.5 mL/hr over 240 Minutes Intravenous 3 times per day 06/26/13 1702 06/26/13 1743   06/26/13 1800  piperacillin-tazobactam (ZOSYN) IVPB 3.375 g     3.375 g 12.5 mL/hr over 240 Minutes Intravenous Every 8 hours 06/26/13 1743     06/26/13 1745  vancomycin (VANCOCIN) 1,250 mg in sodium chloride 0.9 % 250 mL IVPB     1,250 mg 166.7 mL/hr over 90 Minutes Intravenous NOW 06/26/13 1743 06/26/13 2013   06/26/13 1515  vancomycin (VANCOCIN) IVPB 1000 mg/200 mL premix  Status:  Discontinued     1,000 mg 200 mL/hr over 60 Minutes Intravenous  Once 06/26/13 1514 06/26/13 1702   06/26/13 1515  piperacillin-tazobactam (ZOSYN) IVPB 3.375 g  Status:  Discontinued     3.375 g 12.5 mL/hr over 240 Minutes Intravenous  Once 06/26/13 1514 06/26/13 1702  PHYSICAL EXAM: Vital signs in last 24 hours: Filed Vitals:   06/26/13 2325 06/27/13 0017 06/27/13 0652 06/27/13 0841  BP: 178/127 156/77 173/105 168/98  Pulse: 105 91 99 86  Temp: 97.7 F (36.5 C) 97.7 F (36.5 C) 97.9 F (36.6 C)   TempSrc: Axillary Axillary Oral   Resp: 20 20 18    Height:       Weight:      SpO2: 93% 100% 99%     Weight change:  Filed Weights   06/26/13 1707  Weight: 84.188 kg (185 lb 9.6 oz)   Body mass index is 31.84 kg/(m^2).   Gen Exam: Awake, aphasic, follows commands  Neck: Supple, No JVD.   Chest: B/L Clear.   CVS: S1 S2 Regular, no murmurs.  Abdomen: soft, BS +, non tender, non distended.  Extremities: no edema, lower extremities warm to touch. Neurologic: Aphasic, moves all 4 ext-but gen weak all over-difficult exam   Skin: No Rash.   Wounds: N/A.   Intake/Output from previous day:  Intake/Output Summary (Last 24 hours) at 06/27/13 0941 Last data filed at 06/27/13 0004  Gross per 24 hour  Intake   12.5 ml  Output      0 ml  Net   12.5 ml     LAB RESULTS: CBC  Recent Labs Lab 06/21/13 1305 06/21/13 1335 06/26/13 1458 06/26/13 1930 06/27/13 0640  WBC 5.5  --  2.6* 4.5 7.5  HGB 14.2 13.9 7.2* 12.6 15.7*  HCT 42.9 41.0 22.0* 37.7 46.2*  PLT 177  --  97* 159 190  MCV 89.6  --  90.2 88.1 88.3  MCH 29.6  --  29.5 29.4 30.0  MCHC 33.1  --  32.7 33.4 34.0  RDW 13.7  --  13.6 13.7 13.6  LYMPHSABS 1.4  --  0.6*  --   --   MONOABS 0.6  --  0.3  --   --   EOSABS 0.3  --  0.1  --   --   BASOSABS 0.0  --  0.0  --   --     Chemistries   Recent Labs Lab 06/21/13 1305 06/21/13 1335 06/22/13 1425 06/27/13 0640  NA 140 143 139 139  K 4.2 4.1 3.8 3.4*  CL 102 103 104 97  CO2 26  --  24 24  GLUCOSE 102* 105* 104* 141*  BUN 12 12 10 18   CREATININE 1.03 1.30* 1.00 1.14*  CALCIUM 10.3  --  9.5 10.3    CBG:  Recent Labs Lab 06/22/13 1642 06/22/13 2210 06/23/13 0654 06/23/13 1114 06/26/13 1348  GLUCAP 95 98 91 96 100*    GFR Estimated Creatinine Clearance: 39.3 ml/min (by C-G formula based on Cr of 1.14).  Coagulation profile  Recent Labs Lab 06/21/13 1305  INR 1.03    Cardiac Enzymes No results found for this basename: CK, CKMB, TROPONINI, MYOGLOBIN,  in the last 168 hours  No components found with this  basename: POCBNP,  No results found for this basename: DDIMER,  in the last 72 hours No results found for this basename: HGBA1C,  in the last 72 hours No results found for this basename: CHOL, HDL, LDLCALC, TRIG, CHOLHDL, LDLDIRECT,  in the last 72 hours No results found for this basename: TSH, T4TOTAL, FREET3, T3FREE, THYROIDAB,  in the last 72 hours No results found for this basename: VITAMINB12, FOLATE, FERRITIN, TIBC, IRON, RETICCTPCT,  in the last 72 hours No results found for this basename: LIPASE, AMYLASE,  in the last 72 hours  Urine Studies No results found for this basename: UACOL, UAPR, USPG, UPH, UTP, UGL, UKET, UBIL, UHGB, UNIT, UROB, ULEU, UEPI, UWBC, URBC, UBAC, CAST, CRYS, UCOM, BILUA,  in the last 72 hours  MICROBIOLOGY: Recent Results (from the past 240 hour(s))  CULTURE, BLOOD (ROUTINE X 2)     Status: None   Collection Time    06/26/13  4:15 PM      Result Value Range Status   Specimen Description BLOOD HAND LEFT   Final   Special Requests BOTTLES DRAWN AEROBIC ONLY 5CC   Final   Culture  Setup Time     Final   Value: 06/26/2013 21:18     Performed at [facility]   Culture     Final   Value:        BLOOD CULTURE RECEIVED NO GROWTH TO DATE CULTURE WILL BE HELD FOR 5 DAYS BEFORE ISSUING A FINAL NEGATIVE REPORT     Performed at [facility]   Report Status PENDING   Incomplete    RADIOLOGY STUDIES/RESULTS: Dg Chest 2 View  06/26/2013   CLINICAL DATA:  Hypoxia.  COPD.  EXAM: CHEST  2 VIEW  COMPARISON:  06/08/2013.  FINDINGS: Mediastinum and hilar structures are normal. Patient has taken a poor inspiration with mild basilar atelectasis. Mild pneumonia in the lung bases cannot be excluded. COPD. No pleural effusion or pneumothorax. Cardiomegaly, no pulmonary venous congestion. Degenerative changes both shoulders. Degenerative changes thoracic spine.  IMPRESSION: 1. Poor inspiration with mild basilar atelectasis. Mild basilar pneumonia cannot be excluded.  COPD. 2. Cardiomegaly.  No CHF.   Electronically Signed   By: Maisie Fus  Register   On: 06/26/2013 14:29   Dg Chest 2 View  06/08/2013   CLINICAL DATA:  History of stroke  EXAM: CHEST  2 VIEW  COMPARISON:  Chest x-ray of 03/27/2012 this Port-A-Cath appears to have been removed. Aeration has improved with resolution of the opacity in the periphery  FINDINGS: The heart size and mediastinal contours are within normal limits. No active infiltrate or effusion is seen. There is mild peribronchial thickening present. The Port-A-Cath has been removed. Mild cardiomegaly is stable. Mild degenerative changes present throughout the thoracic spine  IMPRESSION: No active lung disease. Mild peribronchial thickening. Stable cardiomegaly.   Electronically Signed   By: Dwyane Dee M.D.   On: 06/08/2013 07:59   Ct Head Wo Contrast  06/26/2013   CLINICAL DATA:  Weakness, history of CVA.  EXAM: CT HEAD WITHOUT CONTRAST  TECHNIQUE: Contiguous axial images were obtained from the base of the skull through the vertex without intravenous contrast.  COMPARISON:  06/21/2013  FINDINGS: There is further evolution and decrease in density of the left-sided deep white matter infarct centered in the region of the corona radiata and superior aspect of the basal ganglia. Advanced small vessel ischemic changes are noted in the periventricular white matter. The brain demonstrates no evidence of hemorrhage, new infarction, edema, mass effect, extra-axial fluid collection, hydrocephalus or mass lesion. The skull is unremarkable.  IMPRESSION: No new or acute findings. Further decrease in density of the left-sided deep white matter infarct centered in the corona radiata.   Electronically Signed   By: Irish Lack M.D.   On: 06/26/2013 15:03   Ct Head Wo Contrast  06/21/2013   ADDENDUM REPORT: 06/21/2013 13:16  ADDENDUM: Critical Value/emergent results were called by telephone at the time of interpretation on 06/21/2013 at 1:16 PM to Dr.Camilo,  who  verbally acknowledged these results.   Electronically Signed   By: Marlan Palau M.D.   On: 06/21/2013 13:16   06/21/2013   CLINICAL DATA:  Code stroke  EXAM: CT HEAD WITHOUT CONTRAST  TECHNIQUE: Contiguous axial images were obtained from the base of the skull through the vertex without intravenous contrast.  COMPARISON:  CT 06/16/2013  FINDINGS: Generalized atrophy. Chronic microvascular ischemic changes in the white matter. Chronic infarct in the deep white matter on the left is unchanged.  Negative for acute infarct. Negative for hemorrhage or mass lesion. Negative for skull lesion.  IMPRESSION: Chronic ischemic changes are stable from the prior CT. No acute abnormality.  Electronically Signed: By: Marlan Palau M.D. On: 06/21/2013 13:14   Ct Head Wo Contrast  06/16/2013   CLINICAL DATA:  Code stroke, left-sided weakness  EXAM: CT HEAD WITHOUT CONTRAST  TECHNIQUE: Contiguous axial images were obtained from the base of the skull through the vertex without intravenous contrast.  COMPARISON:  06/07/2013  FINDINGS: No skull fracture is noted. Atherosclerotic calcifications of carotid siphon again noted.  No intracranial hemorrhage, mass effect or midline shift. No definite acute cortical infarction. Stable cerebral atrophy. Again noted periventricular and subcortical white matter decreased attenuation consistent with chronic small vessel ischemic changes. Stable small lacunar infarct left lentiform nucleus and coronal radiata.  IMPRESSION: No intracranial hemorrhage, mass effect or midline shift. No definite acute cortical infarction. Stable cerebral atrophy. Again noted periventricular and subcortical white matter decreased attenuation consistent with chronic small vessel ischemic changes. Stable small lacunar infarct left lentiform nucleus and coronal radiata.   Electronically Signed   By: Natasha Mead M.D.   On: 06/16/2013 09:10   Ct Head (brain) Wo Contrast  06/07/2013   CLINICAL DATA:  Dysphasia.  Nausea  EXAM: CT HEAD WITHOUT CONTRAST  TECHNIQUE: Contiguous axial images were obtained from the base of the skull through the vertex without intravenous contrast.  COMPARISON:  CT 05/31/2013  FINDINGS: Generalized atrophy. Moderate chronic microvascular ischemic change in the white matter. Negative for acute infarct. Negative for hemorrhage or mass lesion. Ventricle size is normal. No shift of the midline structures. Calvarium is intact.  IMPRESSION: Chronic microvascular ischemic change. No acute abnormality.   Electronically Signed   By: Marlan Palau M.D.   On: 06/07/2013 10:17   Ct Head (brain) Wo Contrast  05/31/2013   CLINICAL DATA:  Code stroke.  Difficulty speaking  EXAM: CT HEAD WITHOUT CONTRAST  TECHNIQUE: Contiguous axial images were obtained from the base of the skull through the vertex without intravenous contrast.  COMPARISON:  05/27/2013  FINDINGS: There is patchy diffuse low-attenuation within the subcortical and periventricular white matter compatible with chronic microvascular disease.  There is prominence of the sulci and ventricles consistent with brain atrophy. Evolutionary changes of subacute infarct within the left lentiform nucleus and coronal radiata is again noted.  No midline shift, ventriculomegaly, mass effect, evidence of mass lesion, intracranial hemorrhage or evidence of cortically based acute infarction. Gray-white matter differentiation is within normal limits throughout the brain.  The paranasal sinuses and mastoid air cells are clear. The skull is intact.  IMPRESSION: 1. Subacute infarct involving the left lentiform nucleus and coronal radiata. No acute intracranial abnormalities noted.  2. Chronic small vessel ischemic disease and brain atrophy.   Electronically Signed   By: Signa Kell M.D.   On: 05/31/2013 16:43   [name] Head Wo Contrast  06/07/2013   CLINICAL DATA:  Recurrent aphasia. Recent infarct. Patient is anticoagulated.  EXAM: MRI HEAD WITHOUT CONTRAST  MRA  HEAD WITHOUT CONTRAST  TECHNIQUE: Multiplanar, multiecho pulse sequences of the brain and surrounding structures were obtained without intravenous contrast. Angiographic images of the head were obtained using MRA technique without contrast.  COMPARISON:  MR brain 05/31/2013. MR brain 05/27/2013. CT head earlier today.  FINDINGS: MRI HEAD FINDINGS  Pseudonormalization on DWI series of the more acute punctate nonhemorrhagic infarct (demonstrated on MR 05/31/2013) involving the posterior left coronal radiata. The older subacute infarct of the left lentiform nucleus and anterior coronal radiata shows typical evolutionary change with "normalized" peripheral hyperintensity on DWI sequence but some central restricted diffusion. No intervening hemorrhage within the infarct.  There is a subcentimeter focus of T1 shortening in the posterior putamen on the left. This could be located at the very lower most extent of the infarct based on prior images showing the area of acute restricted diffusion at its largest. I cannot exclude a tiny focus of subacute microhemorrhage in the left putamen as this area has evolved compared with 10/25 and 10/29. This area is not visible on today's CT scan.  No new infarcts. Continued atrophy with chronic microvascular ischemic change.  MRA HEAD FINDINGS  Widely patent internal carotid arteries. Basilar artery widely patent with left vertebral dominant. Right vertebral maintains a hypoplastic connection to the basilar and appears to primarily supply the PICA. There is no proximal intracranial stenosis or aneurysm.  IMPRESSION: No new infarction is evident. Typical evolutionary change of the previously identified left deep white matter and basal ganglia infarcts.  There is a subcentimeter focus of T1 shortening, possible subacute hemorrhage, in the left posterior putamen marginally adjacent to the area of previous acute infarction displayed on 05/27/2013. Significance uncertain.  Unremarkable MRA  intracranial circulation.   Electronically Signed   By: Davonna Belling M.D.   On: 06/07/2013 15:44   Mri Brain Without Contrast  06/22/2013   CLINICAL DATA:  79 year old female with history of stroke and frequent TIAs. Recent seizure, abnormal speech. Garbled speech. Initial encounter. History of atrial fibrillation.  EXAM: MRI HEAD WITHOUT CONTRAST  MRA HEAD WITHOUT CONTRAST  TECHNIQUE: Multiplanar, multiecho pulse sequences of the brain and surrounding structures were obtained without intravenous contrast. Angiographic images of the head were obtained using MRA technique without contrast.  COMPARISON:  06/07/2013 and earlier.  FINDINGS: MRI HEAD FINDINGS  Recent left corona radiata lacunar infarct with expected evolution on diffusion weighted imaging and other sequences. However, there are 2 new punctate areas of restricted diffusion in each hemisphere at the level of the centrum semiovale (right side series 5, image 23, left side image 22). No associated mass effect or hemorrhage. No other acute diffusion abnormality identified.  Major intracranial vascular flow voids are stable.  Otherwise stable brain with confluent cerebral white matter T2 and FLAIR hyperintensity. No midline shift, mass effect, or evidence of intracranial mass lesion. No acute intracranial hemorrhage identified. Negative pituitary, cervicomedullary junction and stable visualized cervical spine. Normal bone marrow signal. Stable orbits soft tissues, paranasal sinuses, mastoids and scalp soft tissues.  MRA HEAD FINDINGS  Stable antegrade flow in the posterior circulation with dominant and somewhat dolichoectatic distal left vertebral artery. PICA origins remain patent. Basilar artery and AICA origins remain patent without stenosis. SCA and PCA origins are stable and within normal limits. Bilateral PCA branches are stable and within normal limits. Posterior communicating arteries are diminutive or absent as before.  Stable antegrade flow in  both ICA siphons. Stable Mild ICA irregularity without stenosis.  Ophthalmic artery origins are stable and normal. Carotid termini are stable and normal. MCA and ACA origins are stable and normal. Diminutive anterior communicating artery and visualized ACA branches remain within normal limits. Visualized bilateral MCA branches are stable and within normal limits.  IMPRESSION: 1. Expected evolution of the recent left corona radiata lacunar infarct, but now with 2 new punctate areas of restricted diffusion in the hemispheric white matter, 1 in each hemisphere, compatible with acute lacunar infarcts. Given the history of atrial fibrillation this suggests a recent mild embolic event.  2. No mass effect or acute hemorrhage and otherwise stable MRI appearance of the brain.  3. Stable and negative for age intracranial MRA.   Electronically Signed   By: Augusto Gamble M.D.   On: 06/22/2013 09:05   Mr Brain Wo Contrast  06/07/2013   CLINICAL DATA:  Recurrent aphasia. Recent infarct. Patient is anticoagulated.  EXAM: MRI HEAD WITHOUT CONTRAST  MRA HEAD WITHOUT CONTRAST  TECHNIQUE: Multiplanar, multiecho pulse sequences of the brain and surrounding structures were obtained without intravenous contrast. Angiographic images of the head were obtained using MRA technique without contrast.  COMPARISON:  MR brain 05/31/2013. MR brain 05/27/2013. CT head earlier today.  FINDINGS: MRI HEAD FINDINGS  Pseudonormalization on DWI series of the more acute punctate nonhemorrhagic infarct (demonstrated on MR 05/31/2013) involving the posterior left coronal radiata. The older subacute infarct of the left lentiform nucleus and anterior coronal radiata shows typical evolutionary change with "normalized" peripheral hyperintensity on DWI sequence but some central restricted diffusion. No intervening hemorrhage within the infarct.  There is a subcentimeter focus of T1 shortening in the posterior putamen on the left. This could be located at the very  lower most extent of the infarct based on prior images showing the area of acute restricted diffusion at its largest. I cannot exclude a tiny focus of subacute microhemorrhage in the left putamen as this area has evolved compared with 10/25 and 10/29. This area is not visible on today's CT scan.  No new infarcts. Continued atrophy with chronic microvascular ischemic change.  MRA HEAD FINDINGS  Widely patent internal carotid arteries. Basilar artery widely patent with left vertebral dominant. Right vertebral maintains a hypoplastic connection to the basilar and appears to primarily supply the PICA. There is no proximal intracranial stenosis or aneurysm.  IMPRESSION: No new infarction is evident. Typical evolutionary change of the previously identified left deep white matter and basal ganglia infarcts.  There is a subcentimeter focus of T1 shortening, possible subacute hemorrhage, in the left posterior putamen marginally adjacent to the area of previous acute infarction displayed on 05/27/2013. Significance uncertain.  Unremarkable MRA intracranial circulation.   Electronically Signed   By: Davonna Belling M.D.   On: 06/07/2013 15:44   Mr Brain Wo Contrast  05/31/2013   CLINICAL DATA:  Code stroke. Recent infarcts 4 days ago. New onset of slurred speech today. Right lower extremity weakness and heaviness.  EXAM: MRI HEAD WITHOUT CONTRAST  TECHNIQUE: Multiplanar, multisequence MR imaging was performed. No intravenous contrast was administered.  COMPARISON:  MRI brain 05/27/2013  FINDINGS: The previously-seen acute infarct involving the right lentiform nucleus and coronal radiata is stable. There is a new punctate infarct involving the more posterior left coronal radiata. Atrophy and diffuse white matter disease is otherwise stable. No hemorrhage or mass lesion is present. The ventricles are proportionate to the degree of atrophy. Flow is present in the major intracranial arteries. The patient is status post bilateral  lens extractions.  The paranasal sinuses and mastoid air cells are clear. The degenerative changes of the upper cervical spine are again noted.  IMPRESSION: 1. New punctate nonhemorrhagic infarct involving the posterior left coronal radiata. 2. The acute infarct of the left lentiform nucleus and coronal radiata is otherwise stable. 3. Atrophy and diffuse white matter disease is also stable. 4. Moderate cervical spondylosis.   Electronically Signed   By: Gennette Pac M.D.   On: 05/31/2013 20:21   [name] Head/brain Wo Cm  06/22/2013   CLINICAL DATA:  79 year old female with history of stroke and frequent TIAs. Recent seizure, abnormal speech. Garbled speech. Initial encounter. History of atrial fibrillation.  EXAM: MRI HEAD WITHOUT CONTRAST  MRA HEAD WITHOUT CONTRAST  TECHNIQUE: Multiplanar, multiecho pulse sequences of the brain and surrounding structures were obtained without intravenous contrast. Angiographic images of the head were obtained using MRA technique without contrast.  COMPARISON:  06/07/2013 and earlier.  FINDINGS: MRI HEAD FINDINGS  Recent left corona radiata lacunar infarct with expected evolution on diffusion weighted imaging and other sequences. However, there are 2 new punctate areas of restricted diffusion in each hemisphere at the level of the centrum semiovale (right side series 5, image 23, left side image 22). No associated mass effect or hemorrhage. No other acute diffusion abnormality identified.  Major intracranial vascular flow voids are stable.  Otherwise stable brain with confluent cerebral white matter T2 and FLAIR hyperintensity. No midline shift, mass effect, or evidence of intracranial mass lesion. No acute intracranial hemorrhage identified. Negative pituitary, cervicomedullary junction and stable visualized cervical spine. Normal bone marrow signal. Stable orbits soft tissues, paranasal sinuses, mastoids and scalp soft tissues.  MRA HEAD FINDINGS  Stable antegrade flow in the  posterior circulation with dominant and somewhat dolichoectatic distal left vertebral artery. PICA origins remain patent. Basilar artery and AICA origins remain patent without stenosis. SCA and PCA origins are stable and within normal limits. Bilateral PCA branches are stable and within normal limits. Posterior communicating arteries are diminutive or absent as before.  Stable antegrade flow in both ICA siphons. Stable Mild ICA irregularity without stenosis. Ophthalmic artery origins are stable and normal. Carotid termini are stable and normal. MCA and ACA origins are stable and normal. Diminutive anterior communicating artery and visualized ACA branches remain within normal limits. Visualized bilateral MCA branches are stable and within normal limits.  IMPRESSION: 1. Expected evolution of the recent left corona radiata lacunar infarct, but now with 2 new punctate areas of restricted diffusion in the hemispheric white matter, 1 in each hemisphere, compatible with acute lacunar infarcts. Given the history of atrial fibrillation this suggests a recent mild embolic event.  2. No mass effect or acute hemorrhage and otherwise stable MRI appearance of the brain.  3. Stable and negative for age intracranial MRA.   Electronically Signed   By: Augusto Gamble M.D.   On: 06/22/2013 09:05    Jeoffrey Massed, MD  [facility] Pager:[phone]  If 7PM-7AM, please contact night-coverage [email or web address] Password [email or web address] 06/27/2013, 9:41 AM   LOS: 1 day

## 2013-06-28 DIAGNOSIS — G929 Unspecified toxic encephalopathy: Secondary | ICD-10-CM

## 2013-06-28 DIAGNOSIS — G92 Toxic encephalopathy: Secondary | ICD-10-CM

## 2013-06-28 LAB — CBC
MCH: 29.9 pg (ref 26.0–34.0)
MCHC: 33.3 g/dL (ref 30.0–36.0)
Platelets: 185 10*3/uL (ref 150–400)
RDW: 14 % (ref 11.5–15.5)
WBC: 7.3 10*3/uL (ref 4.0–10.5)

## 2013-06-28 LAB — URINALYSIS, ROUTINE W REFLEX MICROSCOPIC
Glucose, UA: NEGATIVE mg/dL
Ketones, ur: NEGATIVE mg/dL
Nitrite: NEGATIVE
Protein, ur: NEGATIVE mg/dL
Specific Gravity, Urine: 1.02 (ref 1.005–1.030)

## 2013-06-28 LAB — URINE MICROSCOPIC-ADD ON

## 2013-06-28 MED ORDER — ALBUTEROL SULFATE (5 MG/ML) 0.5% IN NEBU
2.5000 mg | INHALATION_SOLUTION | Freq: Two times a day (BID) | RESPIRATORY_TRACT | Status: DC
  Administered 2013-06-28 – 2013-06-30 (×4): 2.5 mg via RESPIRATORY_TRACT
  Filled 2013-06-28 (×4): qty 0.5

## 2013-06-28 MED ORDER — POTASSIUM CHLORIDE CRYS ER 20 MEQ PO TBCR
40.0000 meq | EXTENDED_RELEASE_TABLET | Freq: Once | ORAL | Status: AC
  Administered 2013-06-28: 40 meq via ORAL
  Filled 2013-06-28: qty 2

## 2013-06-28 NOTE — Clinical Documentation Improvement (Signed)
  To Hospitalist MD's/ PA's and NP's  Patient's BMI is 17.44 per nutrition consult note. Please document appropriate secondary diagnosis.  Thank you   Possible Clinical conditions  Cachexia   Underweight   Other condition  Cannot clinically determine   Risk Factors: SS pain crisis, Schizoaffective disorder   Recommendation: Ensure Complete po BID   Thank You, Lavonda Jumbo ,RN Clinical Documentation Specialist:  [phone]  [facility]- Health Information Management

## 2013-06-28 NOTE — Clinical Social Work Placement (Signed)
Clinical Social Work Department CLINICAL SOCIAL WORK PLACEMENT NOTE 06/28/2013  Patient:  [name], [name]  Account Number:  [id number] Admit date:  06/26/2013  Clinical Social Worker:  Cherre Blanc, [place]  Date/time:  06/27/2013 04:05 PM  Clinical Social Work is seeking post-discharge placement for this patient at the following level of care:   SKILLED NURSING   (*CSW will update this form in Epic as items are completed)   06/27/2013  Patient/family provided with [facility] Department of Clinical Social Work's list of facilities offering this level of care within the geographic area requested by the patient (or if unable, by the patient's family).  06/27/2013  Patient/family informed of their freedom to choose among providers that offer the needed level of care, that participate in Medicare, Medicaid or managed care program needed by the patient, have an available bed and are willing to accept the patient.  06/27/2013  Patient/family informed of [facility]' ownership interest in [facility], as well as of the fact that they are under no obligation to receive care at this facility.  PASARR submitted to [employer] on 06/27/2013 PASARR number received from [employer] on 06/27/2013  FL2 transmitted to all facilities in geographic area requested by pt/family on  06/27/2013 FL2 transmitted to all facilities within larger geographic area on   Patient informed that his/her managed care company has contracts with or will negotiate with  certain facilities, including the following:     Patient/family informed of bed offers received:  06/28/2013 Patient chooses bed at  Physician recommends and patient chooses bed at    Patient to be transferred to  on   Patient to be transferred to facility by   The following physician request were entered in Epic:   Additional Comments:   Roddie Mc, Bryon Lions, [phone]

## 2013-06-28 NOTE — Progress Notes (Signed)
PATIENT DETAILS Name: [name] Age: 79 y.o. Sex: female Date of Birth: 12-12-1927 Admit Date: 06/26/2013 Admitting Physician No admitting provider for patient encounter. GNF:[id number],VHQI EDWARD, MD  Subjective: Awake and alert, she was eating breakfast this morning. Husband at bedside.  Assessment/Plan:  Acute respiratory failure with hypoxia -better, suspected multifactorial from probable Aspiration PNA/COPD -treat above, and continue to O2 as needed  Active Problems: HCAP vs Asp PNA -Was on vancomycin and Zosyn, now on Zosyn and hold it. -SLP recommended regular diet with thin liquids -Blood culture NGTD, still needs oxygen.  ?Anemia -Hb of 7.2 on admit-however Hb 12.6 on repeat yesterday and 15.7 today-PRBC   -Transfusion stopped, as patient became combative-no overt blood loss-per daughter no bowel movements in the past 4 days. -hold of on PRBC transfusion, continue with Xarelto and follow Hb trend and clinical course  Atrial fibrillation: - rate controlled. Will continue Xarelto and bisoprolol  HYPERTENSION -continue Bisoprolol -add amlodipine -follow BP trend  COPD:  -stable. - continue albuterol and pulmicort.   Hx of seizures -No seizures noted-on Keppra-continue  Hx of recurrent Embolic CVA -c/w Xarelto, Statins -with residual aphasia-but follows some commands  GERD: -continue PPI BID  Failure to Thrive -multiple recent admission-no non verbal, very minimal ambulation over the past few days, poor appetitie -SLP, PT eval -Palliative eval -suspect will require SNF Vs CIR  Disposition: Remain inpatient  DVT Prophylaxis: On Xarelto  Code Status: Full code  Family Communication Daughter at bedside  Procedures:  None  CONSULTS:  None   MEDICATIONS: Scheduled Meds: . albuterol  2.5 mg Nebulization BID  . amLODipine  5 mg Oral Daily  . bisoprolol  5 mg Oral Q1200  . budesonide (PULMICORT) nebulizer solution  0.25 mg  Nebulization BID  . levETIRAcetam  750 mg Oral BID  . neomycin-polymyxin b-dexamethasone  1 drop Left Eye Q6H  . pantoprazole  40 mg Oral BID  . piperacillin-tazobactam (ZOSYN)  IV  3.375 g Intravenous Q8H  . Rivaroxaban  15 mg Oral QPC supper   Continuous Infusions:  PRN Meds:.acetaminophen, albuterol, ALPRAZolam, diphenhydrAMINE, hydrALAZINE  Antibiotics: Anti-infectives   Start     Dose/Rate Route Frequency Ordered Stop   06/27/13 1800  vancomycin (VANCOCIN) 500 mg in sodium chloride 0.9 % 100 mL IVPB  Status:  Discontinued     500 mg 100 mL/hr over 60 Minutes Intravenous Every 12 hours 06/27/13 1347 06/27/13 1646   06/27/13 0600  vancomycin (VANCOCIN) IVPB 750 mg/150 ml premix  Status:  Discontinued     750 mg 150 mL/hr over 60 Minutes Intravenous Every 12 hours 06/26/13 1743 06/27/13 1347   06/27/13 0315  vancomycin (VANCOCIN) IVPB 1000 mg/200 mL premix  Status:  Discontinued     1,000 mg 200 mL/hr over 60 Minutes Intravenous Every 12 hours 06/26/13 1702 06/26/13 1742   06/26/13 2200  piperacillin-tazobactam (ZOSYN) IVPB 3.375 g  Status:  Discontinued     3.375 g 12.5 mL/hr over 240 Minutes Intravenous 3 times per day 06/26/13 1702 06/26/13 1743   06/26/13 1800  piperacillin-tazobactam (ZOSYN) IVPB 3.375 g     3.375 g 12.5 mL/hr over 240 Minutes Intravenous Every 8 hours 06/26/13 1743     06/26/13 1745  vancomycin (VANCOCIN) 1,250 mg in sodium chloride 0.9 % 250 mL IVPB     1,250 mg 166.7 mL/hr over 90 Minutes Intravenous NOW 06/26/13 1743 06/26/13 2013   06/26/13 1515  vancomycin (VANCOCIN) IVPB 1000 mg/200 mL premix  Status:  Discontinued  1,000 mg 200 mL/hr over 60 Minutes Intravenous  Once 06/26/13 1514 06/26/13 1702   06/26/13 1515  piperacillin-tazobactam (ZOSYN) IVPB 3.375 g  Status:  Discontinued     3.375 g 12.5 mL/hr over 240 Minutes Intravenous  Once 06/26/13 1514 06/26/13 1702       PHYSICAL EXAM: Vital signs in last 24 hours: Filed Vitals:   06/27/13  2110 06/28/13 0608 06/28/13 0930 06/28/13 0956  BP:  97/63  111/64  Pulse: 82 87    Temp:  97.7 F (36.5 C)    TempSrc:  Oral    Resp: 18 18    Height:      Weight:      SpO2: 94% 98% 85%     Weight change:  Filed Weights   06/26/13 1707  Weight: 84.188 kg (185 lb 9.6 oz)   Body mass index is 31.84 kg/(m^2).   Gen Exam: Awake, aphasic, follows commands  Neck: Supple, No JVD.   Chest: B/L Clear.   CVS: S1 S2 Regular, no murmurs.  Abdomen: soft, BS +, non tender, non distended.  Extremities: no edema, lower extremities warm to touch. Neurologic: Aphasic, moves all 4 ext-but gen weak all over-difficult exam   Skin: No Rash.   Wounds: N/A.   Intake/Output from previous day:  Intake/Output Summary (Last 24 hours) at 06/28/13 1338 Last data filed at 06/27/13 1901  Gross per 24 hour  Intake      0 ml  Output      0 ml  Net      0 ml     LAB RESULTS: CBC  Recent Labs Lab 06/26/13 1458 06/26/13 1930 06/27/13 0640 06/28/13 0525  WBC 2.6* 4.5 7.5 7.3  HGB 7.2* 12.6 15.7* 14.5  HCT 22.0* 37.7 46.2* 43.6  PLT 97* 159 190 185  MCV 90.2 88.1 88.3 89.9  MCH 29.5 29.4 30.0 29.9  MCHC 32.7 33.4 34.0 33.3  RDW 13.6 13.7 13.6 14.0  LYMPHSABS 0.6*  --   --   --   MONOABS 0.3  --   --   --   EOSABS 0.1  --   --   --   BASOSABS 0.0  --   --   --     Chemistries   Recent Labs Lab 06/22/13 1425 06/27/13 0640  NA 139 139  K 3.8 3.4*  CL 104 97  CO2 24 24  GLUCOSE 104* 141*  BUN 10 18  CREATININE 1.00 1.14*  CALCIUM 9.5 10.3    CBG:  Recent Labs Lab 06/22/13 1642 06/22/13 2210 06/23/13 0654 06/23/13 1114 06/26/13 1348  GLUCAP 95 98 91 96 100*    GFR Estimated Creatinine Clearance: 39.3 ml/min (by C-G formula based on Cr of 1.14).  Coagulation profile No results found for this basename: INR, PROTIME,  in the last 168 hours  Cardiac Enzymes No results found for this basename: CK, CKMB, TROPONINI, MYOGLOBIN,  in the last 168 hours  No components  found with this basename: POCBNP,  No results found for this basename: DDIMER,  in the last 72 hours No results found for this basename: HGBA1C,  in the last 72 hours No results found for this basename: CHOL, HDL, LDLCALC, TRIG, CHOLHDL, LDLDIRECT,  in the last 72 hours No results found for this basename: TSH, T4TOTAL, FREET3, T3FREE, THYROIDAB,  in the last 72 hours No results found for this basename: VITAMINB12, FOLATE, FERRITIN, TIBC, IRON, RETICCTPCT,  in the last 72 hours No results found for  this basename: LIPASE, AMYLASE,  in the last 72 hours  Urine Studies No results found for this basename: UACOL, UAPR, USPG, UPH, UTP, UGL, UKET, UBIL, UHGB, UNIT, UROB, ULEU, UEPI, UWBC, URBC, UBAC, CAST, CRYS, UCOM, BILUA,  in the last 72 hours  MICROBIOLOGY: Recent Results (from the past 240 hour(s))  CULTURE, BLOOD (ROUTINE X 2)     Status: None   Collection Time    06/26/13  4:15 PM      Result Value Range Status   Specimen Description BLOOD HAND LEFT   Final   Special Requests BOTTLES DRAWN AEROBIC ONLY 5CC   Final   Culture  Setup Time     Final   Value: 06/26/2013 21:18     Performed at [facility]   Culture     Final   Value:        BLOOD CULTURE RECEIVED NO GROWTH TO DATE CULTURE WILL BE HELD FOR 5 DAYS BEFORE ISSUING A FINAL NEGATIVE REPORT     Performed at [facility]   Report Status PENDING   Incomplete  CULTURE, BLOOD (ROUTINE X 2)     Status: None   Collection Time    06/26/13  7:30 PM      Result Value Range Status   Specimen Description BLOOD LEFT HAND   Final   Special Requests BOTTLES DRAWN AEROBIC ONLY 6CC   Final   Culture  Setup Time     Final   Value: 06/27/2013 01:08     Performed at [facility]   Culture     Final   Value:        BLOOD CULTURE RECEIVED NO GROWTH TO DATE CULTURE WILL BE HELD FOR 5 DAYS BEFORE ISSUING A FINAL NEGATIVE REPORT     Performed at [facility]   Report Status PENDING   Incomplete    RADIOLOGY  STUDIES/RESULTS: Dg Chest 2 View  06/26/2013   CLINICAL DATA:  Hypoxia.  COPD.  EXAM: CHEST  2 VIEW  COMPARISON:  06/08/2013.  FINDINGS: Mediastinum and hilar structures are normal. Patient has taken a poor inspiration with mild basilar atelectasis. Mild pneumonia in the lung bases cannot be excluded. COPD. No pleural effusion or pneumothorax. Cardiomegaly, no pulmonary venous congestion. Degenerative changes both shoulders. Degenerative changes thoracic spine.  IMPRESSION: 1. Poor inspiration with mild basilar atelectasis. Mild basilar pneumonia cannot be excluded. COPD. 2. Cardiomegaly.  No CHF.   Electronically Signed   By: Maisie Fus  Register   On: 06/26/2013 14:29   Dg Chest 2 View  06/08/2013   CLINICAL DATA:  History of stroke  EXAM: CHEST  2 VIEW  COMPARISON:  Chest x-ray of 03/27/2012 this Port-A-Cath appears to have been removed. Aeration has improved with resolution of the opacity in the periphery  FINDINGS: The heart size and mediastinal contours are within normal limits. No active infiltrate or effusion is seen. There is mild peribronchial thickening present. The Port-A-Cath has been removed. Mild cardiomegaly is stable. Mild degenerative changes present throughout the thoracic spine  IMPRESSION: No active lung disease. Mild peribronchial thickening. Stable cardiomegaly.   Electronically Signed   By: Dwyane Dee M.D.   On: 06/08/2013 07:59   Ct Head Wo Contrast  06/26/2013   CLINICAL DATA:  Weakness, history of CVA.  EXAM: CT HEAD WITHOUT CONTRAST  TECHNIQUE: Contiguous axial images were obtained from the base of the skull through the vertex without intravenous contrast.  COMPARISON:  06/21/2013  FINDINGS: There is  further evolution and decrease in density of the left-sided deep white matter infarct centered in the region of the corona radiata and superior aspect of the basal ganglia. Advanced small vessel ischemic changes are noted in the periventricular white matter. The brain demonstrates no  evidence of hemorrhage, new infarction, edema, mass effect, extra-axial fluid collection, hydrocephalus or mass lesion. The skull is unremarkable.  IMPRESSION: No new or acute findings. Further decrease in density of the left-sided deep white matter infarct centered in the corona radiata.   Electronically Signed   By: Irish Lack M.D.   On: 06/26/2013 15:03   Ct Head Wo Contrast  06/21/2013   ADDENDUM REPORT: 06/21/2013 13:16  ADDENDUM: Critical Value/emergent results were called by telephone at the time of interpretation on 06/21/2013 at 1:16 PM to Dr.Camilo, who verbally acknowledged these results.   Electronically Signed   By: Marlan Palau M.D.   On: 06/21/2013 13:16   06/21/2013   CLINICAL DATA:  Code stroke  EXAM: CT HEAD WITHOUT CONTRAST  TECHNIQUE: Contiguous axial images were obtained from the base of the skull through the vertex without intravenous contrast.  COMPARISON:  CT 06/16/2013  FINDINGS: Generalized atrophy. Chronic microvascular ischemic changes in the white matter. Chronic infarct in the deep white matter on the left is unchanged.  Negative for acute infarct. Negative for hemorrhage or mass lesion. Negative for skull lesion.  IMPRESSION: Chronic ischemic changes are stable from the prior CT. No acute abnormality.  Electronically Signed: By: Marlan Palau M.D. On: 06/21/2013 13:14   Ct Head Wo Contrast  06/16/2013   CLINICAL DATA:  Code stroke, left-sided weakness  EXAM: CT HEAD WITHOUT CONTRAST  TECHNIQUE: Contiguous axial images were obtained from the base of the skull through the vertex without intravenous contrast.  COMPARISON:  06/07/2013  FINDINGS: No skull fracture is noted. Atherosclerotic calcifications of carotid siphon again noted.  No intracranial hemorrhage, mass effect or midline shift. No definite acute cortical infarction. Stable cerebral atrophy. Again noted periventricular and subcortical white matter decreased attenuation consistent with chronic small vessel  ischemic changes. Stable small lacunar infarct left lentiform nucleus and coronal radiata.  IMPRESSION: No intracranial hemorrhage, mass effect or midline shift. No definite acute cortical infarction. Stable cerebral atrophy. Again noted periventricular and subcortical white matter decreased attenuation consistent with chronic small vessel ischemic changes. Stable small lacunar infarct left lentiform nucleus and coronal radiata.   Electronically Signed   By: Natasha Mead M.D.   On: 06/16/2013 09:10   Ct Head (brain) Wo Contrast  06/07/2013   CLINICAL DATA:  Dysphasia. Nausea  EXAM: CT HEAD WITHOUT CONTRAST  TECHNIQUE: Contiguous axial images were obtained from the base of the skull through the vertex without intravenous contrast.  COMPARISON:  CT 05/31/2013  FINDINGS: Generalized atrophy. Moderate chronic microvascular ischemic change in the white matter. Negative for acute infarct. Negative for hemorrhage or mass lesion. Ventricle size is normal. No shift of the midline structures. Calvarium is intact.  IMPRESSION: Chronic microvascular ischemic change. No acute abnormality.   Electronically Signed   By: Marlan Palau M.D.   On: 06/07/2013 10:17   Ct Head (brain) Wo Contrast  05/31/2013   CLINICAL DATA:  Code stroke.  Difficulty speaking  EXAM: CT HEAD WITHOUT CONTRAST  TECHNIQUE: Contiguous axial images were obtained from the base of the skull through the vertex without intravenous contrast.  COMPARISON:  05/27/2013  FINDINGS: There is patchy diffuse low-attenuation within the subcortical and periventricular white matter compatible with chronic microvascular  disease.  There is prominence of the sulci and ventricles consistent with brain atrophy. Evolutionary changes of subacute infarct within the left lentiform nucleus and coronal radiata is again noted.  No midline shift, ventriculomegaly, mass effect, evidence of mass lesion, intracranial hemorrhage or evidence of cortically based acute infarction.  Gray-white matter differentiation is within normal limits throughout the brain.  The paranasal sinuses and mastoid air cells are clear. The skull is intact.  IMPRESSION: 1. Subacute infarct involving the left lentiform nucleus and coronal radiata. No acute intracranial abnormalities noted.  2. Chronic small vessel ischemic disease and brain atrophy.   Electronically Signed   By: Signa Kell M.D.   On: 05/31/2013 16:43   [name] Head Wo Contrast  06/07/2013   CLINICAL DATA:  Recurrent aphasia. Recent infarct. Patient is anticoagulated.  EXAM: MRI HEAD WITHOUT CONTRAST  MRA HEAD WITHOUT CONTRAST  TECHNIQUE: Multiplanar, multiecho pulse sequences of the brain and surrounding structures were obtained without intravenous contrast. Angiographic images of the head were obtained using MRA technique without contrast.  COMPARISON:  MR brain 05/31/2013. MR brain 05/27/2013. CT head earlier today.  FINDINGS: MRI HEAD FINDINGS  Pseudonormalization on DWI series of the more acute punctate nonhemorrhagic infarct (demonstrated on MR 05/31/2013) involving the posterior left coronal radiata. The older subacute infarct of the left lentiform nucleus and anterior coronal radiata shows typical evolutionary change with "normalized" peripheral hyperintensity on DWI sequence but some central restricted diffusion. No intervening hemorrhage within the infarct.  There is a subcentimeter focus of T1 shortening in the posterior putamen on the left. This could be located at the very lower most extent of the infarct based on prior images showing the area of acute restricted diffusion at its largest. I cannot exclude a tiny focus of subacute microhemorrhage in the left putamen as this area has evolved compared with 10/25 and 10/29. This area is not visible on today's CT scan.  No new infarcts. Continued atrophy with chronic microvascular ischemic change.  MRA HEAD FINDINGS  Widely patent internal carotid arteries. Basilar artery widely patent  with left vertebral dominant. Right vertebral maintains a hypoplastic connection to the basilar and appears to primarily supply the PICA. There is no proximal intracranial stenosis or aneurysm.  IMPRESSION: No new infarction is evident. Typical evolutionary change of the previously identified left deep white matter and basal ganglia infarcts.  There is a subcentimeter focus of T1 shortening, possible subacute hemorrhage, in the left posterior putamen marginally adjacent to the area of previous acute infarction displayed on 05/27/2013. Significance uncertain.  Unremarkable MRA intracranial circulation.   Electronically Signed   By: Davonna Belling M.D.   On: 06/07/2013 15:44   Mri Brain Without Contrast  06/22/2013   CLINICAL DATA:  79 year old female with history of stroke and frequent TIAs. Recent seizure, abnormal speech. Garbled speech. Initial encounter. History of atrial fibrillation.  EXAM: MRI HEAD WITHOUT CONTRAST  MRA HEAD WITHOUT CONTRAST  TECHNIQUE: Multiplanar, multiecho pulse sequences of the brain and surrounding structures were obtained without intravenous contrast. Angiographic images of the head were obtained using MRA technique without contrast.  COMPARISON:  06/07/2013 and earlier.  FINDINGS: MRI HEAD FINDINGS  Recent left corona radiata lacunar infarct with expected evolution on diffusion weighted imaging and other sequences. However, there are 2 new punctate areas of restricted diffusion in each hemisphere at the level of the centrum semiovale (right side series 5, image 23, left side image 22). No associated mass effect or hemorrhage. No other acute diffusion  abnormality identified.  Major intracranial vascular flow voids are stable.  Otherwise stable brain with confluent cerebral white matter T2 and FLAIR hyperintensity. No midline shift, mass effect, or evidence of intracranial mass lesion. No acute intracranial hemorrhage identified. Negative pituitary, cervicomedullary junction and stable  visualized cervical spine. Normal bone marrow signal. Stable orbits soft tissues, paranasal sinuses, mastoids and scalp soft tissues.  MRA HEAD FINDINGS  Stable antegrade flow in the posterior circulation with dominant and somewhat dolichoectatic distal left vertebral artery. PICA origins remain patent. Basilar artery and AICA origins remain patent without stenosis. SCA and PCA origins are stable and within normal limits. Bilateral PCA branches are stable and within normal limits. Posterior communicating arteries are diminutive or absent as before.  Stable antegrade flow in both ICA siphons. Stable Mild ICA irregularity without stenosis. Ophthalmic artery origins are stable and normal. Carotid termini are stable and normal. MCA and ACA origins are stable and normal. Diminutive anterior communicating artery and visualized ACA branches remain within normal limits. Visualized bilateral MCA branches are stable and within normal limits.  IMPRESSION: 1. Expected evolution of the recent left corona radiata lacunar infarct, but now with 2 new punctate areas of restricted diffusion in the hemispheric white matter, 1 in each hemisphere, compatible with acute lacunar infarcts. Given the history of atrial fibrillation this suggests a recent mild embolic event.  2. No mass effect or acute hemorrhage and otherwise stable MRI appearance of the brain.  3. Stable and negative for age intracranial MRA.   Electronically Signed   By: Augusto Gamble M.D.   On: 06/22/2013 09:05   Mr Brain Wo Contrast  06/07/2013   CLINICAL DATA:  Recurrent aphasia. Recent infarct. Patient is anticoagulated.  EXAM: MRI HEAD WITHOUT CONTRAST  MRA HEAD WITHOUT CONTRAST  TECHNIQUE: Multiplanar, multiecho pulse sequences of the brain and surrounding structures were obtained without intravenous contrast. Angiographic images of the head were obtained using MRA technique without contrast.  COMPARISON:  MR brain 05/31/2013. MR brain 05/27/2013. CT head earlier  today.  FINDINGS: MRI HEAD FINDINGS  Pseudonormalization on DWI series of the more acute punctate nonhemorrhagic infarct (demonstrated on MR 05/31/2013) involving the posterior left coronal radiata. The older subacute infarct of the left lentiform nucleus and anterior coronal radiata shows typical evolutionary change with "normalized" peripheral hyperintensity on DWI sequence but some central restricted diffusion. No intervening hemorrhage within the infarct.  There is a subcentimeter focus of T1 shortening in the posterior putamen on the left. This could be located at the very lower most extent of the infarct based on prior images showing the area of acute restricted diffusion at its largest. I cannot exclude a tiny focus of subacute microhemorrhage in the left putamen as this area has evolved compared with 10/25 and 10/29. This area is not visible on today's CT scan.  No new infarcts. Continued atrophy with chronic microvascular ischemic change.  MRA HEAD FINDINGS  Widely patent internal carotid arteries. Basilar artery widely patent with left vertebral dominant. Right vertebral maintains a hypoplastic connection to the basilar and appears to primarily supply the PICA. There is no proximal intracranial stenosis or aneurysm.  IMPRESSION: No new infarction is evident. Typical evolutionary change of the previously identified left deep white matter and basal ganglia infarcts.  There is a subcentimeter focus of T1 shortening, possible subacute hemorrhage, in the left posterior putamen marginally adjacent to the area of previous acute infarction displayed on 05/27/2013. Significance uncertain.  Unremarkable MRA intracranial circulation.   Electronically Signed  By: Davonna Belling M.D.   On: 06/07/2013 15:44   Mr Brain Wo Contrast  05/31/2013   CLINICAL DATA:  Code stroke. Recent infarcts 4 days ago. New onset of slurred speech today. Right lower extremity weakness and heaviness.  EXAM: MRI HEAD WITHOUT CONTRAST   TECHNIQUE: Multiplanar, multisequence MR imaging was performed. No intravenous contrast was administered.  COMPARISON:  MRI brain 05/27/2013  FINDINGS: The previously-seen acute infarct involving the right lentiform nucleus and coronal radiata is stable. There is a new punctate infarct involving the more posterior left coronal radiata. Atrophy and diffuse white matter disease is otherwise stable. No hemorrhage or mass lesion is present. The ventricles are proportionate to the degree of atrophy. Flow is present in the major intracranial arteries. The patient is status post bilateral lens extractions. The paranasal sinuses and mastoid air cells are clear. The degenerative changes of the upper cervical spine are again noted.  IMPRESSION: 1. New punctate nonhemorrhagic infarct involving the posterior left coronal radiata. 2. The acute infarct of the left lentiform nucleus and coronal radiata is otherwise stable. 3. Atrophy and diffuse white matter disease is also stable. 4. Moderate cervical spondylosis.   Electronically Signed   By: Gennette Pac M.D.   On: 05/31/2013 20:21   [name] Head/brain Wo Cm  06/22/2013   CLINICAL DATA:  79 year old female with history of stroke and frequent TIAs. Recent seizure, abnormal speech. Garbled speech. Initial encounter. History of atrial fibrillation.  EXAM: MRI HEAD WITHOUT CONTRAST  MRA HEAD WITHOUT CONTRAST  TECHNIQUE: Multiplanar, multiecho pulse sequences of the brain and surrounding structures were obtained without intravenous contrast. Angiographic images of the head were obtained using MRA technique without contrast.  COMPARISON:  06/07/2013 and earlier.  FINDINGS: MRI HEAD FINDINGS  Recent left corona radiata lacunar infarct with expected evolution on diffusion weighted imaging and other sequences. However, there are 2 new punctate areas of restricted diffusion in each hemisphere at the level of the centrum semiovale (right side series 5, image 23, left side image 22).  No associated mass effect or hemorrhage. No other acute diffusion abnormality identified.  Major intracranial vascular flow voids are stable.  Otherwise stable brain with confluent cerebral white matter T2 and FLAIR hyperintensity. No midline shift, mass effect, or evidence of intracranial mass lesion. No acute intracranial hemorrhage identified. Negative pituitary, cervicomedullary junction and stable visualized cervical spine. Normal bone marrow signal. Stable orbits soft tissues, paranasal sinuses, mastoids and scalp soft tissues.  MRA HEAD FINDINGS  Stable antegrade flow in the posterior circulation with dominant and somewhat dolichoectatic distal left vertebral artery. PICA origins remain patent. Basilar artery and AICA origins remain patent without stenosis. SCA and PCA origins are stable and within normal limits. Bilateral PCA branches are stable and within normal limits. Posterior communicating arteries are diminutive or absent as before.  Stable antegrade flow in both ICA siphons. Stable Mild ICA irregularity without stenosis. Ophthalmic artery origins are stable and normal. Carotid termini are stable and normal. MCA and ACA origins are stable and normal. Diminutive anterior communicating artery and visualized ACA branches remain within normal limits. Visualized bilateral MCA branches are stable and within normal limits.  IMPRESSION: 1. Expected evolution of the recent left corona radiata lacunar infarct, but now with 2 new punctate areas of restricted diffusion in the hemispheric white matter, 1 in each hemisphere, compatible with acute lacunar infarcts. Given the history of atrial fibrillation this suggests a recent mild embolic event.  2. No mass effect or acute hemorrhage  and otherwise stable MRI appearance of the brain.  3. Stable and negative for age intracranial MRA.   Electronically Signed   By: Augusto Gamble M.D.   On: 06/22/2013 09:05    Clydia Llano A, MD  [facility] Pager:[phone]  [phone]  If 7PM-7AM, please contact night-coverage [email or web address] Password [email or web address] 06/28/2013, 1:38 PM   LOS: 2 days

## 2013-06-28 NOTE — Progress Notes (Signed)
Physical Therapy Treatment Patient Details Name: [name] MRN: [id number] DOB: Dec 12, 1927 Today's Date: 06/28/2013 Time: 1324-4010 PT Time Calculation (min): 25 min  PT Assessment / Plan / Recommendation  History of Present Illness  [name] is a 79 y.o. female with PMH of stroke and frequent admissions for TIAs, recent seizure, hypertension hyperlipidemia, COPD, atrial fibrillation requiring anticoagulation that presents emergency room complaining of generalized weakness, increased SOB and non productive cough.  Husband reports about 4 admissions in past month with pt becoming progressively weaker especially on right side.   PT Comments   Pt continues to be a great candidate for CIR. Is progressing slowly. Continues to demo decreased balance and decreased independence with mobility/transfers. Pt has very supportive family who was present throughout session. Encouraged pt to continue getting OOB with nursing; educated family on general LE exercises to increased and maintain strength. Will cont to follow per POC.   Follow Up Recommendations  CIR;Supervision/Assistance - 24 hour     Does the patient have the potential to tolerate intense rehabilitation     Barriers to Discharge        Equipment Recommendations  None recommended by PT    Recommendations for Other Services Rehab consult  Frequency Min 3X/week   Progress towards PT Goals Progress towards PT goals: Progressing toward goals  Plan Current plan remains appropriate    Precautions / Restrictions Precautions Precautions: Fall Restrictions Weight Bearing Restrictions: No   Pertinent Vitals/Pain No complaints of pain.     Mobility  Bed Mobility Bed Mobility: Supine to Sit;Sitting - Scoot to Edge of Bed Supine to Sit: 4: Min assist;HOB elevated;With rails Sitting - Scoot to Edge of Bed: 4: Min assist Details for Bed Mobility Assistance: pt requires (A) to bring trunk to upright sitting position at EOB; pt  able to initate transition to EOB but required max cues and (A) to acheive Transfers Transfers: Stand to Sit;Sit to Stand;Stand Pivot Transfers Sit to Stand: 3: Mod assist;From bed;From chair/3-in-1;With upper extremity assist;With armrests Stand to Sit: 3: Mod assist;To chair/3-in-1;With armrests;With upper extremity assist Stand Pivot Transfers: 3: Mod assist;From elevated surface;With armrests Details for Transfer Assistance: pt requesting to transfer to [facility]; requies (A) to maintain balance and shift weight to pivot to [facility] then to chair; cues for hand placement and sequencing; pt unsteady  Ambulation/Gait Ambulation/Gait Assistance: 3: Mod assist Ambulation Distance (Feet): 4 Feet (pivotal steps BSC to chair ) Assistive device: 1 person hand held assist Ambulation/Gait Assistance Details: multimodal cues for sequencing and hand placement; pt unsteady with ambulation and requried HHA and (A) around waist to maintain balance  Gait Pattern:  (pivotal shuffled steps) Gait velocity: Decreased Stairs: No Wheelchair Mobility Wheelchair Mobility: No    Exercises General Exercises - Lower Extremity Ankle Circles/Pumps: AROM;Both;10 reps;Supine Long Arc Quad: AROM;Both;10 reps;Seated;AAROM;Other (comment) (pt with difficulty follow directions and sequencing ) Hip Flexion/Marching: AROM;Both;10 reps;Seated (increased cues; due to decr sequencing )   PT Diagnosis:    PT Problem List:   PT Treatment Interventions:     PT Goals (current goals can now be found in the care plan section) Acute Rehab PT Goals Patient Stated Goal: husband really wants pt to get rehab in hospital then come home PT Goal Formulation: With patient/family Time For Goal Achievement: 07/11/13 Potential to Achieve Goals: Good  Visit Information  Last PT Received On: 06/28/13 Assistance Needed: +2 History of Present Illness:  [name] is a 79 y.o. female with PMH of stroke  and frequent admissions for TIAs,  recent seizure, hypertension hyperlipidemia, COPD, atrial fibrillation requiring anticoagulation that presents emergency room complaining of generalized weakness, increased SOB and non productive cough.  Husband reports about 4 admissions in past month with pt becoming progressively weaker especially on right side.    Subjective Data  Subjective: pt lying supine; more alert today; continues to require max cues and incr time when answering questions; agreeable to therapy  Patient Stated Goal: husband really wants pt to get rehab in hospital then come home   Cognition  Cognition Arousal/Alertness: Awake/alert Behavior During Therapy: Flat affect Overall Cognitive Status: Impaired/Different from baseline Area of Impairment: Orientation;Attention;Memory;Following commands;Safety/judgement;Problem solving Orientation Level: Disoriented to;Situation;Time Current Attention Level: Selective Memory: Decreased short-term memory Following Commands: Follows one step commands inconsistently;Follows one step commands with increased time Safety/Judgement: Decreased awareness of safety;Decreased awareness of deficits Problem Solving: Slow processing;Decreased initiation    Balance  Balance Balance Assessed: Yes Static Sitting Balance Static Sitting - Balance Support: Feet supported;Bilateral upper extremity supported Static Sitting - Level of Assistance: 5: Stand by assistance;4: Min assist Static Sitting - Comment/# of Minutes: LOB constatly throughout sitting EOB ~5 min; pt unaware of LOB and required multimodal cues and (A) to return to upright sitting position at EOB  End of Session PT - End of Session Equipment Utilized During Treatment: Oxygen;Gait belt Activity Tolerance: Patient tolerated treatment well Patient left: in chair;with call bell/phone within reach;with chair alarm set;with family/visitor present Nurse Communication: Mobility status   GP     Donell Sievert,  [place] [phone] 06/28/2013, 1:50 PM

## 2013-06-28 NOTE — Progress Notes (Signed)
Progress Note from the Palliative Medicine Team at [facility]  Subjective: patient is OOB to chair, husband at bedsdie  -family hopeful for return to baseline and opportunity for rehabilitation   Objective: Allergies  Allergen Reactions  . Ativan [Lorazepam] Other (See Comments)    Severe hallucinations   . Propoxyphene Hcl     Rxn: unknown  . Vancomycin    Scheduled Meds: . albuterol  2.5 mg Nebulization BID  . amLODipine  5 mg Oral Daily  . bisoprolol  5 mg Oral Q1200  . budesonide (PULMICORT) nebulizer solution  0.25 mg Nebulization BID  . levETIRAcetam  750 mg Oral BID  . neomycin-polymyxin b-dexamethasone  1 drop Left Eye Q6H  . pantoprazole  40 mg Oral BID  . piperacillin-tazobactam (ZOSYN)  IV  3.375 g Intravenous Q8H  . potassium chloride  40 mEq Oral Once  . Rivaroxaban  15 mg Oral QPC supper   Continuous Infusions:  PRN Meds:.acetaminophen, albuterol, ALPRAZolam, hydrALAZINE  BP 111/64  Pulse 87  Temp(Src) 97.7 F (36.5 C) (Oral)  Resp 18  Ht 5\' 4"  (1.626 m)  Wt 84.188 kg (185 lb 9.6 oz)  BMI 31.84 kg/m2  SpO2 85%   PPS:30 %  Pain Score:denies   Intake/Output Summary (Last 24 hours) at 06/28/13 1405 Last data filed at 06/28/13 1400  Gross per 24 hour  Intake    500 ml  Output      0 ml  Net    500 ml      Physical Exam: General: Awake, alert  NAD.  HEENT: Buccal membranes mosit, no exudate  Chest:  WOB  CVS: RRR  Abdomen: soft NT +BS  Ext: Bilateral trace ankle edema  Neuro: intermittent confusion, unable to follow commands   Labs: CBC    Component Value Date/Time   WBC 7.3 06/28/2013 0525   WBC 6.7 09/05/2012 0938   RBC 4.85 06/28/2013 0525   RBC 4.30 09/05/2012 0938   HGB 14.5 06/28/2013 0525   HGB 12.4 09/05/2012 0938   HCT 43.6 06/28/2013 0525   HCT 39.4 09/05/2012 0938   PLT 185 06/28/2013 0525   PLT 151 09/05/2012 0938   MCV 89.9 06/28/2013 0525   MCV 91.6 09/05/2012 0938   MCH 29.9 06/28/2013 0525   MCH 28.8 09/05/2012 0938   MCHC  33.3 06/28/2013 0525   MCHC 31.5 09/05/2012 0938   RDW 14.0 06/28/2013 0525   RDW 14.3 09/05/2012 0938   LYMPHSABS 0.6* 06/26/2013 1458   LYMPHSABS 0.8* 09/05/2012 0938   MONOABS 0.3 06/26/2013 1458   MONOABS 0.6 09/05/2012 0938   EOSABS 0.1 06/26/2013 1458   EOSABS 0.2 09/05/2012 0938   BASOSABS 0.0 06/26/2013 1458   BASOSABS 0.0 09/05/2012 0938    BMET    Component Value Date/Time   NA 139 06/27/2013 0640   NA 139 07/06/2012 0949   K 3.4* 06/27/2013 0640   K 4.4 07/06/2012 0949   CL 97 06/27/2013 0640   CL 105 07/06/2012 0949   CO2 24 06/27/2013 0640   CO2 21* 07/06/2012 0949   GLUCOSE 141* 06/27/2013 0640   GLUCOSE 118* 07/06/2012 0949   BUN 18 06/27/2013 0640   BUN 11.0 07/06/2012 0949   CREATININE 1.14* 06/27/2013 0640   CREATININE 1.0 07/06/2012 0949   CALCIUM 10.3 06/27/2013 0640   CALCIUM 9.3 07/06/2012 0949   GFRNONAA 43* 06/27/2013 0640   GFRAA 50* 06/27/2013 0640    CMP     Component Value Date/Time   NA  139 06/27/2013 0640   NA 139 07/06/2012 0949   K 3.4* 06/27/2013 0640   K 4.4 07/06/2012 0949   CL 97 06/27/2013 0640   CL 105 07/06/2012 0949   CO2 24 06/27/2013 0640   CO2 21* 07/06/2012 0949   GLUCOSE 141* 06/27/2013 0640   GLUCOSE 118* 07/06/2012 0949   BUN 18 06/27/2013 0640   BUN 11.0 07/06/2012 0949   CREATININE 1.14* 06/27/2013 0640   CREATININE 1.0 07/06/2012 0949   CALCIUM 10.3 06/27/2013 0640   CALCIUM 9.3 07/06/2012 0949   PROT 8.0 06/21/2013 1305   PROT 6.5 07/06/2012 0949   ALBUMIN 4.3 06/21/2013 1305   ALBUMIN 3.9 07/06/2012 0949   AST 30 06/21/2013 1305   AST 26 07/06/2012 0949   ALT 25 06/21/2013 1305   ALT 24 07/06/2012 0949   ALKPHOS 75 06/21/2013 1305   ALKPHOS 74 07/06/2012 0949   BILITOT 0.6 06/21/2013 1305   BILITOT 0.51 07/06/2012 0949   GFRNONAA 43* 06/27/2013 0640   GFRAA 50* 06/27/2013 0640        Assessment and Plan: 1. Code Status: Full code 2. Symptom Control:        Weakness: continue with PT/OT and dc to short term  rehab  3. Psycho/Social:  Emotional support offered to family and patient at bedside 4. Disposition: Short term rehabilitation, Palliative to follow  Patient Documents Completed or Given: Document Given Completed  Advanced Directives Pkt    MOST yes   DNR    Gone from [facility]    Hard Choices yes    Lorinda Creed NP  Palliative Medicine Team Team Phone # [phone] Pager [phone]   1

## 2013-06-28 NOTE — Progress Notes (Signed)
COPD GOLD referral received. Patient has been followed by [facility] Care Management for pharmacy assistance in the past. Noted patient is supposed to go to SNF at discharge. She reports she will be for short-term SNF placement. Therefore, consents signed for further [facility] Care Management needs. Made inpatient RNCM aware that referral was received for COPD GOLD. Raiford Noble, MSN-Ed, RN, BSN- [facility] [phone]

## 2013-06-29 DIAGNOSIS — N179 Acute kidney failure, unspecified: Secondary | ICD-10-CM

## 2013-06-29 DIAGNOSIS — I10 Essential (primary) hypertension: Secondary | ICD-10-CM

## 2013-06-29 LAB — CBC
Hemoglobin: 14.3 g/dL (ref 12.0–15.0)
MCH: 29 pg (ref 26.0–34.0)
MCHC: 32.8 g/dL (ref 30.0–36.0)
MCV: 88.4 fL (ref 78.0–100.0)
Platelets: 189 10*3/uL (ref 150–400)
RBC: 4.93 MIL/uL (ref 3.87–5.11)
RDW: 13.8 % (ref 11.5–15.5)

## 2013-06-29 LAB — BASIC METABOLIC PANEL
BUN: 29 mg/dL — ABNORMAL HIGH (ref 6–23)
CO2: 27 mEq/L (ref 19–32)
Calcium: 10 mg/dL (ref 8.4–10.5)
Creatinine, Ser: 1.81 mg/dL — ABNORMAL HIGH (ref 0.50–1.10)
Glucose, Bld: 109 mg/dL — ABNORMAL HIGH (ref 70–99)
Potassium: 3.4 mEq/L — ABNORMAL LOW (ref 3.5–5.1)

## 2013-06-29 LAB — LEGIONELLA ANTIGEN, URINE: Legionella Antigen, Urine: NEGATIVE

## 2013-06-29 MED ORDER — HALOPERIDOL LACTATE 5 MG/ML IJ SOLN: 0.5000 mg | Freq: Once | INTRAMUSCULAR | Status: DC | PRN

## 2013-06-29 MED ORDER — POTASSIUM CHLORIDE CRYS ER 20 MEQ PO TBCR
40.0000 meq | EXTENDED_RELEASE_TABLET | Freq: Once | ORAL | Status: AC
  Administered 2013-06-29: 40 meq via ORAL
  Filled 2013-06-29: qty 2

## 2013-06-29 MED ORDER — ALPRAZOLAM 0.5 MG PO TABS
0.5000 mg | ORAL_TABLET | ORAL | Status: AC
  Administered 2013-06-29: 0.5 mg via ORAL
  Filled 2013-06-29: qty 1

## 2013-06-29 MED ORDER — SODIUM CHLORIDE 0.9 % IV SOLN
INTRAVENOUS | Status: DC
  Administered 2013-06-29: 200 mL via INTRAVENOUS

## 2013-06-29 MED ORDER — HALOPERIDOL LACTATE 5 MG/ML IJ SOLN: 0.5000 mg | Freq: Every day | INTRAMUSCULAR | Status: DC | PRN

## 2013-06-29 NOTE — Progress Notes (Signed)
PATIENT DETAILS Name: [name] Age: 79 y.o. Sex: female Date of Birth: March 03, 1928 Admit Date: 06/26/2013 Admitting Physician No admitting provider for patient encounter. UJW:[email or web address],GNFA EDWARD, MD  Subjective: Awake and alert, feels much better, denies any specific complaints. Husband at bedside.  Assessment/Plan:  Acute respiratory failure with hypoxia -better, suspected multifactorial from probable Aspiration PNA/COPD -treat above, and continue to O2 as needed  HCAP vs Asp PNA -Was on vancomycin and Zosyn, now on Zosyn and hold it. -SLP recommended regular diet with thin liquids -Blood culture NGTD, still needs oxygen.  Acute renal failure -Creatinine is 1.8 this morning from 1.1 yesterday. -Discontinue any nephrotoxic medications, start IV fluids. -Check BMP in a.m.  ?Anemia -Hb of 7.2 on admit-however Hb 12.6 on repeat yesterday and 15.7 today-PRBC.? Lab error.   -Transfusion stopped, as patient became combative-no overt blood loss-per daughter no bowel movements in the past 4 days. -hold of on PRBC transfusion, continue with Xarelto and follow Hb trend and clinical course  Atrial fibrillation: - rate controlled. Will continue Xarelto and bisoprolol  HYPERTENSION -continue Bisoprolol -add amlodipine -follow BP trend  COPD:  -stable. - continue albuterol and pulmicort.   Hx of seizures -No seizures noted-on Keppra-continue  Hx of recurrent Embolic CVA -c/w Xarelto, Statins -with residual aphasia-but follows some commands  GERD: -continue PPI BID  Failure to Thrive -multiple recent admission-no non verbal, very minimal ambulation over the past few days, poor appetitie -SLP, PT eval -Palliative eval -suspect will require SNF Vs CIR  Disposition: Remain inpatient  DVT Prophylaxis: On Xarelto  Code Status: Full code  Family Communication Daughter at bedside  Procedures:  None  CONSULTS:  None   MEDICATIONS: Scheduled  Meds: . albuterol  2.5 mg Nebulization BID  . amLODipine  5 mg Oral Daily  . bisoprolol  5 mg Oral Q1200  . budesonide (PULMICORT) nebulizer solution  0.25 mg Nebulization BID  . levETIRAcetam  750 mg Oral BID  . neomycin-polymyxin b-dexamethasone  1 drop Left Eye Q6H  . pantoprazole  40 mg Oral BID  . piperacillin-tazobactam (ZOSYN)  IV  3.375 g Intravenous Q8H  . Rivaroxaban  15 mg Oral QPC supper   Continuous Infusions:  PRN Meds:.acetaminophen, albuterol, ALPRAZolam, hydrALAZINE  Antibiotics: Anti-infectives   Start     Dose/Rate Route Frequency Ordered Stop   06/27/13 1800  vancomycin (VANCOCIN) 500 mg in sodium chloride 0.9 % 100 mL IVPB  Status:  Discontinued     500 mg 100 mL/hr over 60 Minutes Intravenous Every 12 hours 06/27/13 1347 06/27/13 1646   06/27/13 0600  vancomycin (VANCOCIN) IVPB 750 mg/150 ml premix  Status:  Discontinued     750 mg 150 mL/hr over 60 Minutes Intravenous Every 12 hours 06/26/13 1743 06/27/13 1347   06/27/13 0315  vancomycin (VANCOCIN) IVPB 1000 mg/200 mL premix  Status:  Discontinued     1,000 mg 200 mL/hr over 60 Minutes Intravenous Every 12 hours 06/26/13 1702 06/26/13 1742   06/26/13 2200  piperacillin-tazobactam (ZOSYN) IVPB 3.375 g  Status:  Discontinued     3.375 g 12.5 mL/hr over 240 Minutes Intravenous 3 times per day 06/26/13 1702 06/26/13 1743   06/26/13 1800  piperacillin-tazobactam (ZOSYN) IVPB 3.375 g     3.375 g 12.5 mL/hr over 240 Minutes Intravenous Every 8 hours 06/26/13 1743     06/26/13 1745  vancomycin (VANCOCIN) 1,250 mg in sodium chloride 0.9 % 250 mL IVPB     1,250 mg 166.7 mL/hr over 90 Minutes  Intravenous NOW 06/26/13 1743 06/26/13 2013   06/26/13 1515  vancomycin (VANCOCIN) IVPB 1000 mg/200 mL premix  Status:  Discontinued     1,000 mg 200 mL/hr over 60 Minutes Intravenous  Once 06/26/13 1514 06/26/13 1702   06/26/13 1515  piperacillin-tazobactam (ZOSYN) IVPB 3.375 g  Status:  Discontinued     3.375 g 12.5 mL/hr  over 240 Minutes Intravenous  Once 06/26/13 1514 06/26/13 1702       PHYSICAL EXAM: Vital signs in last 24 hours: Filed Vitals:   06/28/13 1556 06/28/13 2023 06/28/13 2209 06/29/13 0642  BP: 98/68 115/73  119/76  Pulse:  80 82 75  Temp:  98.3 F (36.8 C)  98.3 F (36.8 C)  TempSrc:  Oral  Oral  Resp:  18 18 18   Height:      Weight:      SpO2:  98% 97% 89%    Weight change:  Filed Weights   06/26/13 1707  Weight: 84.188 kg (185 lb 9.6 oz)   Body mass index is 31.84 kg/(m^2).   Gen Exam: Awake, aphasic, follows commands  Neck: Supple, No JVD.   Chest: B/L Clear.   CVS: S1 S2 Regular, no murmurs.  Abdomen: soft, BS +, non tender, non distended.  Extremities: no edema, lower extremities warm to touch. Neurologic: Aphasic, moves all 4 ext-but gen weak all over-difficult exam   Skin: No Rash.   Wounds: N/A.   Intake/Output from previous day:  Intake/Output Summary (Last 24 hours) at 06/29/13 1223 Last data filed at 06/29/13 0845  Gross per 24 hour  Intake   1040 ml  Output      0 ml  Net   1040 ml     LAB RESULTS: CBC  Recent Labs Lab 06/26/13 1458 06/26/13 1930 06/27/13 0640 06/28/13 0525 06/29/13 0620  WBC 2.6* 4.5 7.5 7.3 6.1  HGB 7.2* 12.6 15.7* 14.5 14.3  HCT 22.0* 37.7 46.2* 43.6 43.6  PLT 97* 159 190 185 189  MCV 90.2 88.1 88.3 89.9 88.4  MCH 29.5 29.4 30.0 29.9 29.0  MCHC 32.7 33.4 34.0 33.3 32.8  RDW 13.6 13.7 13.6 14.0 13.8  LYMPHSABS 0.6*  --   --   --   --   MONOABS 0.3  --   --   --   --   EOSABS 0.1  --   --   --   --   BASOSABS 0.0  --   --   --   --     Chemistries   Recent Labs Lab 06/22/13 1425 06/27/13 0640 06/29/13 0620  NA 139 139 139  K 3.8 3.4* 3.4*  CL 104 97 100  CO2 24 24 27   GLUCOSE 104* 141* 109*  BUN 10 18 29*  CREATININE 1.00 1.14* 1.81*  CALCIUM 9.5 10.3 10.0    CBG:  Recent Labs Lab 06/22/13 1642 06/22/13 2210 06/23/13 0654 06/23/13 1114 06/26/13 1348  GLUCAP 95 98 91 96 100*     GFR Estimated Creatinine Clearance: 24.7 ml/min (by C-G formula based on Cr of 1.81).  Coagulation profile No results found for this basename: INR, PROTIME,  in the last 168 hours  Cardiac Enzymes No results found for this basename: CK, CKMB, TROPONINI, MYOGLOBIN,  in the last 168 hours  No components found with this basename: POCBNP,  No results found for this basename: DDIMER,  in the last 72 hours No results found for this basename: HGBA1C,  in the last 72 hours No results  found for this basename: CHOL, HDL, LDLCALC, TRIG, CHOLHDL, LDLDIRECT,  in the last 72 hours No results found for this basename: TSH, T4TOTAL, FREET3, T3FREE, THYROIDAB,  in the last 72 hours No results found for this basename: VITAMINB12, FOLATE, FERRITIN, TIBC, IRON, RETICCTPCT,  in the last 72 hours No results found for this basename: LIPASE, AMYLASE,  in the last 72 hours  Urine Studies No results found for this basename: UACOL, UAPR, USPG, UPH, UTP, UGL, UKET, UBIL, UHGB, UNIT, UROB, ULEU, UEPI, UWBC, URBC, UBAC, CAST, CRYS, UCOM, BILUA,  in the last 72 hours  MICROBIOLOGY: Recent Results (from the past 240 hour(s))  CULTURE, BLOOD (ROUTINE X 2)     Status: None   Collection Time    06/26/13  4:15 PM      Result Value Range Status   Specimen Description BLOOD HAND LEFT   Final   Special Requests BOTTLES DRAWN AEROBIC ONLY 5CC   Final   Culture  Setup Time     Final   Value: 06/26/2013 21:18     Performed at [facility]   Culture     Final   Value:        BLOOD CULTURE RECEIVED NO GROWTH TO DATE CULTURE WILL BE HELD FOR 5 DAYS BEFORE ISSUING A FINAL NEGATIVE REPORT     Performed at [facility]   Report Status PENDING   Incomplete  CULTURE, BLOOD (ROUTINE X 2)     Status: None   Collection Time    06/26/13  7:30 PM      Result Value Range Status   Specimen Description BLOOD LEFT HAND   Final   Special Requests BOTTLES DRAWN AEROBIC ONLY 6CC   Final   Culture  Setup Time      Final   Value: 06/27/2013 01:08     Performed at [facility]   Culture     Final   Value:        BLOOD CULTURE RECEIVED NO GROWTH TO DATE CULTURE WILL BE HELD FOR 5 DAYS BEFORE ISSUING A FINAL NEGATIVE REPORT     Performed at [facility]   Report Status PENDING   Incomplete    RADIOLOGY STUDIES/RESULTS: Dg Chest 2 View  06/26/2013   CLINICAL DATA:  Hypoxia.  COPD.  EXAM: CHEST  2 VIEW  COMPARISON:  06/08/2013.  FINDINGS: Mediastinum and hilar structures are normal. Patient has taken a poor inspiration with mild basilar atelectasis. Mild pneumonia in the lung bases cannot be excluded. COPD. No pleural effusion or pneumothorax. Cardiomegaly, no pulmonary venous congestion. Degenerative changes both shoulders. Degenerative changes thoracic spine.  IMPRESSION: 1. Poor inspiration with mild basilar atelectasis. Mild basilar pneumonia cannot be excluded. COPD. 2. Cardiomegaly.  No CHF.   Electronically Signed   By: Maisie Fus  Register   On: 06/26/2013 14:29   Dg Chest 2 View  06/08/2013   CLINICAL DATA:  History of stroke  EXAM: CHEST  2 VIEW  COMPARISON:  Chest x-ray of 03/27/2012 this Port-A-Cath appears to have been removed. Aeration has improved with resolution of the opacity in the periphery  FINDINGS: The heart size and mediastinal contours are within normal limits. No active infiltrate or effusion is seen. There is mild peribronchial thickening present. The Port-A-Cath has been removed. Mild cardiomegaly is stable. Mild degenerative changes present throughout the thoracic spine  IMPRESSION: No active lung disease. Mild peribronchial thickening. Stable cardiomegaly.   Electronically Signed   By: Lucienne Minks.D.  On: 06/08/2013 07:59   Ct Head Wo Contrast  06/26/2013   CLINICAL DATA:  Weakness, history of CVA.  EXAM: CT HEAD WITHOUT CONTRAST  TECHNIQUE: Contiguous axial images were obtained from the base of the skull through the vertex without intravenous contrast.  COMPARISON:   06/21/2013  FINDINGS: There is further evolution and decrease in density of the left-sided deep white matter infarct centered in the region of the corona radiata and superior aspect of the basal ganglia. Advanced small vessel ischemic changes are noted in the periventricular white matter. The brain demonstrates no evidence of hemorrhage, new infarction, edema, mass effect, extra-axial fluid collection, hydrocephalus or mass lesion. The skull is unremarkable.  IMPRESSION: No new or acute findings. Further decrease in density of the left-sided deep white matter infarct centered in the corona radiata.   Electronically Signed   By: Irish Lack M.D.   On: 06/26/2013 15:03   Ct Head Wo Contrast  06/21/2013   ADDENDUM REPORT: 06/21/2013 13:16  ADDENDUM: Critical Value/emergent results were called by telephone at the time of interpretation on 06/21/2013 at 1:16 PM to Dr.Camilo, who verbally acknowledged these results.   Electronically Signed   By: Marlan Palau M.D.   On: 06/21/2013 13:16   06/21/2013   CLINICAL DATA:  Code stroke  EXAM: CT HEAD WITHOUT CONTRAST  TECHNIQUE: Contiguous axial images were obtained from the base of the skull through the vertex without intravenous contrast.  COMPARISON:  CT 06/16/2013  FINDINGS: Generalized atrophy. Chronic microvascular ischemic changes in the white matter. Chronic infarct in the deep white matter on the left is unchanged.  Negative for acute infarct. Negative for hemorrhage or mass lesion. Negative for skull lesion.  IMPRESSION: Chronic ischemic changes are stable from the prior CT. No acute abnormality.  Electronically Signed: By: Marlan Palau M.D. On: 06/21/2013 13:14   Ct Head Wo Contrast  06/16/2013   CLINICAL DATA:  Code stroke, left-sided weakness  EXAM: CT HEAD WITHOUT CONTRAST  TECHNIQUE: Contiguous axial images were obtained from the base of the skull through the vertex without intravenous contrast.  COMPARISON:  06/07/2013  FINDINGS: No skull  fracture is noted. Atherosclerotic calcifications of carotid siphon again noted.  No intracranial hemorrhage, mass effect or midline shift. No definite acute cortical infarction. Stable cerebral atrophy. Again noted periventricular and subcortical white matter decreased attenuation consistent with chronic small vessel ischemic changes. Stable small lacunar infarct left lentiform nucleus and coronal radiata.  IMPRESSION: No intracranial hemorrhage, mass effect or midline shift. No definite acute cortical infarction. Stable cerebral atrophy. Again noted periventricular and subcortical white matter decreased attenuation consistent with chronic small vessel ischemic changes. Stable small lacunar infarct left lentiform nucleus and coronal radiata.   Electronically Signed   By: Natasha Mead M.D.   On: 06/16/2013 09:10   Ct Head (brain) Wo Contrast  06/07/2013   CLINICAL DATA:  Dysphasia. Nausea  EXAM: CT HEAD WITHOUT CONTRAST  TECHNIQUE: Contiguous axial images were obtained from the base of the skull through the vertex without intravenous contrast.  COMPARISON:  CT 05/31/2013  FINDINGS: Generalized atrophy. Moderate chronic microvascular ischemic change in the white matter. Negative for acute infarct. Negative for hemorrhage or mass lesion. Ventricle size is normal. No shift of the midline structures. Calvarium is intact.  IMPRESSION: Chronic microvascular ischemic change. No acute abnormality.   Electronically Signed   By: Marlan Palau M.D.   On: 06/07/2013 10:17   Ct Head (brain) Wo Contrast  05/31/2013   CLINICAL DATA:  Code stroke.  Difficulty speaking  EXAM: CT HEAD WITHOUT CONTRAST  TECHNIQUE: Contiguous axial images were obtained from the base of the skull through the vertex without intravenous contrast.  COMPARISON:  05/27/2013  FINDINGS: There is patchy diffuse low-attenuation within the subcortical and periventricular white matter compatible with chronic microvascular disease.  There is prominence of the  sulci and ventricles consistent with brain atrophy. Evolutionary changes of subacute infarct within the left lentiform nucleus and coronal radiata is again noted.  No midline shift, ventriculomegaly, mass effect, evidence of mass lesion, intracranial hemorrhage or evidence of cortically based acute infarction. Gray-white matter differentiation is within normal limits throughout the brain.  The paranasal sinuses and mastoid air cells are clear. The skull is intact.  IMPRESSION: 1. Subacute infarct involving the left lentiform nucleus and coronal radiata. No acute intracranial abnormalities noted.  2. Chronic small vessel ischemic disease and brain atrophy.   Electronically Signed   By: Signa Kell M.D.   On: 05/31/2013 16:43   [name] Head Wo Contrast  06/07/2013   CLINICAL DATA:  Recurrent aphasia. Recent infarct. Patient is anticoagulated.  EXAM: MRI HEAD WITHOUT CONTRAST  MRA HEAD WITHOUT CONTRAST  TECHNIQUE: Multiplanar, multiecho pulse sequences of the brain and surrounding structures were obtained without intravenous contrast. Angiographic images of the head were obtained using MRA technique without contrast.  COMPARISON:  MR brain 05/31/2013. MR brain 05/27/2013. CT head earlier today.  FINDINGS: MRI HEAD FINDINGS  Pseudonormalization on DWI series of the more acute punctate nonhemorrhagic infarct (demonstrated on MR 05/31/2013) involving the posterior left coronal radiata. The older subacute infarct of the left lentiform nucleus and anterior coronal radiata shows typical evolutionary change with "normalized" peripheral hyperintensity on DWI sequence but some central restricted diffusion. No intervening hemorrhage within the infarct.  There is a subcentimeter focus of T1 shortening in the posterior putamen on the left. This could be located at the very lower most extent of the infarct based on prior images showing the area of acute restricted diffusion at its largest. I cannot exclude a tiny focus of  subacute microhemorrhage in the left putamen as this area has evolved compared with 10/25 and 10/29. This area is not visible on today's CT scan.  No new infarcts. Continued atrophy with chronic microvascular ischemic change.  MRA HEAD FINDINGS  Widely patent internal carotid arteries. Basilar artery widely patent with left vertebral dominant. Right vertebral maintains a hypoplastic connection to the basilar and appears to primarily supply the PICA. There is no proximal intracranial stenosis or aneurysm.  IMPRESSION: No new infarction is evident. Typical evolutionary change of the previously identified left deep white matter and basal ganglia infarcts.  There is a subcentimeter focus of T1 shortening, possible subacute hemorrhage, in the left posterior putamen marginally adjacent to the area of previous acute infarction displayed on 05/27/2013. Significance uncertain.  Unremarkable MRA intracranial circulation.   Electronically Signed   By: Davonna Belling M.D.   On: 06/07/2013 15:44   Mri Brain Without Contrast  06/22/2013   CLINICAL DATA:  79 year old female with history of stroke and frequent TIAs. Recent seizure, abnormal speech. Garbled speech. Initial encounter. History of atrial fibrillation.  EXAM: MRI HEAD WITHOUT CONTRAST  MRA HEAD WITHOUT CONTRAST  TECHNIQUE: Multiplanar, multiecho pulse sequences of the brain and surrounding structures were obtained without intravenous contrast. Angiographic images of the head were obtained using MRA technique without contrast.  COMPARISON:  06/07/2013 and earlier.  FINDINGS: MRI HEAD FINDINGS  Recent left corona radiata  lacunar infarct with expected evolution on diffusion weighted imaging and other sequences. However, there are 2 new punctate areas of restricted diffusion in each hemisphere at the level of the centrum semiovale (right side series 5, image 23, left side image 22). No associated mass effect or hemorrhage. No other acute diffusion abnormality identified.   Major intracranial vascular flow voids are stable.  Otherwise stable brain with confluent cerebral white matter T2 and FLAIR hyperintensity. No midline shift, mass effect, or evidence of intracranial mass lesion. No acute intracranial hemorrhage identified. Negative pituitary, cervicomedullary junction and stable visualized cervical spine. Normal bone marrow signal. Stable orbits soft tissues, paranasal sinuses, mastoids and scalp soft tissues.  MRA HEAD FINDINGS  Stable antegrade flow in the posterior circulation with dominant and somewhat dolichoectatic distal left vertebral artery. PICA origins remain patent. Basilar artery and AICA origins remain patent without stenosis. SCA and PCA origins are stable and within normal limits. Bilateral PCA branches are stable and within normal limits. Posterior communicating arteries are diminutive or absent as before.  Stable antegrade flow in both ICA siphons. Stable Mild ICA irregularity without stenosis. Ophthalmic artery origins are stable and normal. Carotid termini are stable and normal. MCA and ACA origins are stable and normal. Diminutive anterior communicating artery and visualized ACA branches remain within normal limits. Visualized bilateral MCA branches are stable and within normal limits.  IMPRESSION: 1. Expected evolution of the recent left corona radiata lacunar infarct, but now with 2 new punctate areas of restricted diffusion in the hemispheric white matter, 1 in each hemisphere, compatible with acute lacunar infarcts. Given the history of atrial fibrillation this suggests a recent mild embolic event.  2. No mass effect or acute hemorrhage and otherwise stable MRI appearance of the brain.  3. Stable and negative for age intracranial MRA.   Electronically Signed   By: Augusto Gamble M.D.   On: 06/22/2013 09:05   Mr Brain Wo Contrast  06/07/2013   CLINICAL DATA:  Recurrent aphasia. Recent infarct. Patient is anticoagulated.  EXAM: MRI HEAD WITHOUT CONTRAST  MRA  HEAD WITHOUT CONTRAST  TECHNIQUE: Multiplanar, multiecho pulse sequences of the brain and surrounding structures were obtained without intravenous contrast. Angiographic images of the head were obtained using MRA technique without contrast.  COMPARISON:  MR brain 05/31/2013. MR brain 05/27/2013. CT head earlier today.  FINDINGS: MRI HEAD FINDINGS  Pseudonormalization on DWI series of the more acute punctate nonhemorrhagic infarct (demonstrated on MR 05/31/2013) involving the posterior left coronal radiata. The older subacute infarct of the left lentiform nucleus and anterior coronal radiata shows typical evolutionary change with "normalized" peripheral hyperintensity on DWI sequence but some central restricted diffusion. No intervening hemorrhage within the infarct.  There is a subcentimeter focus of T1 shortening in the posterior putamen on the left. This could be located at the very lower most extent of the infarct based on prior images showing the area of acute restricted diffusion at its largest. I cannot exclude a tiny focus of subacute microhemorrhage in the left putamen as this area has evolved compared with 10/25 and 10/29. This area is not visible on today's CT scan.  No new infarcts. Continued atrophy with chronic microvascular ischemic change.  MRA HEAD FINDINGS  Widely patent internal carotid arteries. Basilar artery widely patent with left vertebral dominant. Right vertebral maintains a hypoplastic connection to the basilar and appears to primarily supply the PICA. There is no proximal intracranial stenosis or aneurysm.  IMPRESSION: No new infarction is evident. Typical evolutionary change  of the previously identified left deep white matter and basal ganglia infarcts.  There is a subcentimeter focus of T1 shortening, possible subacute hemorrhage, in the left posterior putamen marginally adjacent to the area of previous acute infarction displayed on 05/27/2013. Significance uncertain.  Unremarkable MRA  intracranial circulation.   Electronically Signed   By: Davonna Belling M.D.   On: 06/07/2013 15:44   Mr Brain Wo Contrast  05/31/2013   CLINICAL DATA:  Code stroke. Recent infarcts 4 days ago. New onset of slurred speech today. Right lower extremity weakness and heaviness.  EXAM: MRI HEAD WITHOUT CONTRAST  TECHNIQUE: Multiplanar, multisequence MR imaging was performed. No intravenous contrast was administered.  COMPARISON:  MRI brain 05/27/2013  FINDINGS: The previously-seen acute infarct involving the right lentiform nucleus and coronal radiata is stable. There is a new punctate infarct involving the more posterior left coronal radiata. Atrophy and diffuse white matter disease is otherwise stable. No hemorrhage or mass lesion is present. The ventricles are proportionate to the degree of atrophy. Flow is present in the major intracranial arteries. The patient is status post bilateral lens extractions. The paranasal sinuses and mastoid air cells are clear. The degenerative changes of the upper cervical spine are again noted.  IMPRESSION: 1. New punctate nonhemorrhagic infarct involving the posterior left coronal radiata. 2. The acute infarct of the left lentiform nucleus and coronal radiata is otherwise stable. 3. Atrophy and diffuse white matter disease is also stable. 4. Moderate cervical spondylosis.   Electronically Signed   By: Gennette Pac M.D.   On: 05/31/2013 20:21   [name] Head/brain Wo Cm  06/22/2013   CLINICAL DATA:  79 year old female with history of stroke and frequent TIAs. Recent seizure, abnormal speech. Garbled speech. Initial encounter. History of atrial fibrillation.  EXAM: MRI HEAD WITHOUT CONTRAST  MRA HEAD WITHOUT CONTRAST  TECHNIQUE: Multiplanar, multiecho pulse sequences of the brain and surrounding structures were obtained without intravenous contrast. Angiographic images of the head were obtained using MRA technique without contrast.  COMPARISON:  06/07/2013 and earlier.  FINDINGS:  MRI HEAD FINDINGS  Recent left corona radiata lacunar infarct with expected evolution on diffusion weighted imaging and other sequences. However, there are 2 new punctate areas of restricted diffusion in each hemisphere at the level of the centrum semiovale (right side series 5, image 23, left side image 22). No associated mass effect or hemorrhage. No other acute diffusion abnormality identified.  Major intracranial vascular flow voids are stable.  Otherwise stable brain with confluent cerebral white matter T2 and FLAIR hyperintensity. No midline shift, mass effect, or evidence of intracranial mass lesion. No acute intracranial hemorrhage identified. Negative pituitary, cervicomedullary junction and stable visualized cervical spine. Normal bone marrow signal. Stable orbits soft tissues, paranasal sinuses, mastoids and scalp soft tissues.  MRA HEAD FINDINGS  Stable antegrade flow in the posterior circulation with dominant and somewhat dolichoectatic distal left vertebral artery. PICA origins remain patent. Basilar artery and AICA origins remain patent without stenosis. SCA and PCA origins are stable and within normal limits. Bilateral PCA branches are stable and within normal limits. Posterior communicating arteries are diminutive or absent as before.  Stable antegrade flow in both ICA siphons. Stable Mild ICA irregularity without stenosis. Ophthalmic artery origins are stable and normal. Carotid termini are stable and normal. MCA and ACA origins are stable and normal. Diminutive anterior communicating artery and visualized ACA branches remain within normal limits. Visualized bilateral MCA branches are stable and within normal limits.  IMPRESSION: 1. Expected  evolution of the recent left corona radiata lacunar infarct, but now with 2 new punctate areas of restricted diffusion in the hemispheric white matter, 1 in each hemisphere, compatible with acute lacunar infarcts. Given the history of atrial fibrillation this  suggests a recent mild embolic event.  2. No mass effect or acute hemorrhage and otherwise stable MRI appearance of the brain.  3. Stable and negative for age intracranial MRA.   Electronically Signed   By: Augusto Gamble M.D.   On: 06/22/2013 09:05    Clydia Llano A, MD  [facility] Pager:[phone]  If 7PM-7AM, please contact night-coverage [email or web address] Password [email or web address] 06/29/2013, 12:23 PM   LOS: 3 days

## 2013-06-29 NOTE — Progress Notes (Signed)
Pt fidgety, anxious, and restless. Xanax was given with no relief. MD on call notified. Orders given. V/s were stable with o2 sats of 97% with 2L of oxygen. Pt continues to take nasal cannula off. Told her to be sure she keeps it on. Will continue to monitor .

## 2013-06-30 ENCOUNTER — Telehealth: Payer: Self-pay | Admitting: Internal Medicine

## 2013-06-30 DIAGNOSIS — R531 Weakness: Secondary | ICD-10-CM

## 2013-06-30 DIAGNOSIS — R509 Fever, unspecified: Secondary | ICD-10-CM

## 2013-06-30 LAB — BASIC METABOLIC PANEL
CO2: 26 mEq/L (ref 19–32)
Calcium: 9.8 mg/dL (ref 8.4–10.5)
Creatinine, Ser: 1.46 mg/dL — ABNORMAL HIGH (ref 0.50–1.10)
GFR calc non Af Amer: 32 mL/min — ABNORMAL LOW (ref >90)
Potassium: 3.5 mEq/L (ref 3.5–5.1)

## 2013-06-30 MED ORDER — AMOXICILLIN-POT CLAVULANATE 875-125 MG PO TABS: 1.0000 | ORAL_TABLET | Freq: Two times a day (BID) | ORAL | Status: DC

## 2013-06-30 MED ORDER — ALPRAZOLAM 0.5 MG PO TABS: 0.5000 mg | ORAL_TABLET | Freq: Every evening | ORAL | Status: DC | PRN

## 2013-06-30 NOTE — Discharge Summary (Addendum)
Physician Discharge Summary  [name] VHQ:[id number] DOB: 1927-08-12 DOA: 06/26/2013  PCP: [name], MD  Admit date: 06/26/2013 Discharge date: 06/30/2013  Time spent: 40 minutes  Recommendations for Outpatient Follow-up:  1. Followup with primary care physician within one week  Discharge Diagnoses:  Principal Problem:   Acute respiratory failure with hypoxia Active Problems:   HYPERLIPIDEMIA   ANEMIA-NOS   ANXIETY   DEPRESSION   HYPERTENSION   Atrial fibrillation   COPD   GERD   Focal seizure   HCAP (healthcare-associated pneumonia)   Hx of ischemic multifocal multiple vascular territories stroke   Palliative care encounter   Weakness generalized   Discharge Condition: Stable  Diet recommendation: Heart healthy diet.  Filed Weights   06/26/13 1707  Weight: 84.188 kg (185 lb 9.6 oz)    History of present illness:  [name] is a 79 y.o. female with PMH of stroke and frequent admissions for TIAs, recent seizure, hypertension hyperlipidemia, COPD, atrial fibrillation requiring anticoagulation that presents emergency room complaining of generalized weakness, increased SOB and non productive cough. Patient and family denies fever, chills, nausea, vomiting, CP, palpitations, seizure activity or any other complaints. Patient was just discharge home on 11/21 due to TIA/stroke admission. In ED was found to be hypoxic on RA, with Hgb of 7.2, leukopenia and CXR with infiltrates. [facility] called to admit patient for further evaluation and treatment.  Hospital Course:   1. Pneumonia: Patient was recently discharged from the hospital stay she was treated for acute/subacute stroke. Presented back to the hospital with shortness of breath, hypoxia on room air and leukopenia. Chest x-ray showed infiltrates. Patient started empirically on Zosyn and vancomycin. Treated as a health care associated pneumonia versus aspiration pneumonia. Patient was seen by SLP and  recommended regular diet with liquids, it appears that patient had very low risk for aspiration. Vancomycin was discontinued Zosyn continued while she was in the hospital. On discharge patient discharged on Augmentin for 5 more days. I explained to the patient and her family patient might have loose stools from Augmentin.  2. Respiratory distress and hypoxia: Acute hypoxic respiratory failure, mild with oxygen saturation of 88% on room air without respiratory distress. This is likely secondary to pneumonia. Patient is still on 2 L of oxygen continue to wean off of oxygen.  3.? Anemia: Patient presented to the hospital with hemoglobin of 7.2, started on transfusion and patient did refuse it. Recheck hemoglobin was 12.6 and the very next day hemoglobin went up to 15.7. This is very concerning for lab error as patient did not have intervention.  4. Atrial fibrillation: Patient rate is controlled, patient is on Xarelto and bisoprolol.  5. Recent CVA: Recurrent embolic CVA, MRI on previous admission on 06/21/2013 showed no evidence of acute strokes. Patient to continue on statin and Xarelto.  6. History of seizure: No seizure-like activity noticed while she is in the hospital this time. Patient on Keppra, continue.  7. Failure to thrive: Recent deconditioning secondary to recent stroke and pneumonia, PT/OT recommended SNF versus inpatient rehabilitation. Patient was ready for discharge and family did not like the SNF bed options(their first option was not available), they chose to go home with home health services.  Procedures:  None  Consultations:  None  Discharge Exam: Filed Vitals:   06/30/13 0514  BP: 113/75  Pulse: 85  Temp: 98.1 F (36.7 C)  Resp: 18   General: Alert and awake, oriented x3, not in any acute distress. HEENT: anicteric  sclera, pupils reactive to light and accommodation, EOMI CVS: S1-S2 clear, no murmur rubs or gallops Chest: clear to auscultation bilaterally, no  wheezing, rales or rhonchi Abdomen: soft nontender, nondistended, normal bowel sounds, no organomegaly Extremities: no cyanosis, clubbing or edema noted bilaterally Neuro: Cranial nerves II-XII intact, gait was not tested, work finding difficulty but no slurred speech.  Discharge Instructions   Future Appointments Provider Department Dept Phone   07/10/2013 10:00 AM Adam Gus Rankin, DO [facility] Neurology [facility] [phone]   08/10/2013 9:45 AM Rana Snare, NP [facility] MEDICAL ONCOLOGY [phone]   09/11/2013 11:30 AM Stacie Glaze, MD [facility] [phone]       Medication List         ALPRAZolam 0.5 MG tablet  Commonly known as:  XANAX  Take 1 tablet (0.5 mg total) by mouth at bedtime as needed for sleep.     amoxicillin-clavulanate 875-125 MG per tablet  Commonly known as:  AUGMENTIN  Take 1 tablet by mouth 2 (two) times daily.     atorvastatin 10 MG tablet  Commonly known as:  LIPITOR  Take 1 tablet (10 mg total) by mouth daily at 6 PM.     bisoprolol 5 MG tablet  Commonly known as:  ZEBETA  Take 5 mg by mouth daily at 12 noon.     DULoxetine 30 MG capsule  Commonly known as:  CYMBALTA  Take 1 capsule (30 mg total) by mouth daily.     levETIRAcetam 750 MG tablet  Commonly known as:  KEPPRA  Take 1 tablet (750 mg total) by mouth 2 (two) times daily.     loperamide 2 MG capsule  Commonly known as:  IMODIUM  Take 2 mg by mouth daily as needed for diarrhea or loose stools.     neomycin-polymyxin b-dexamethasone 3.5-10000-0.1 Susp  Commonly known as:  MAXITROL  Place 1 drop into the left eye every 6 (six) hours.     Rivaroxaban 15 MG Tabs tablet  Commonly known as:  XARELTO  Take 1 tablet (15 mg total) by mouth daily after supper.     VITAMIN B12 PO  Take 5,000 mcg by mouth daily.     Vitamin D (Ergocalciferol) 50000 UNITS Caps capsule  Commonly known as:  DRISDOL  Take 50,000 Units by mouth every Sunday.        Allergies  Allergen Reactions  . Ativan [Lorazepam] Other (See Comments)    Severe hallucinations   . Propoxyphene Hcl     Rxn: unknown  . Vancomycin       The results of significant diagnostics from this hospitalization (including imaging, microbiology, ancillary and laboratory) are listed below for reference.    Significant Diagnostic Studies: Dg Chest 2 View  06/26/2013   CLINICAL DATA:  Hypoxia.  COPD.  EXAM: CHEST  2 VIEW  COMPARISON:  06/08/2013.  FINDINGS: Mediastinum and hilar structures are normal. Patient has taken a poor inspiration with mild basilar atelectasis. Mild pneumonia in the lung bases cannot be excluded. COPD. No pleural effusion or pneumothorax. Cardiomegaly, no pulmonary venous congestion. Degenerative changes both shoulders. Degenerative changes thoracic spine.  IMPRESSION: 1. Poor inspiration with mild basilar atelectasis. Mild basilar pneumonia cannot be excluded. COPD. 2. Cardiomegaly.  No CHF.   Electronically Signed   By: Maisie Fus  Register   On: 06/26/2013 14:29   Dg Chest 2 View  06/08/2013   CLINICAL DATA:  History of stroke  EXAM: CHEST  2 VIEW  COMPARISON:  Chest x-ray of 03/27/2012 this Port-A-Cath appears to have been removed. Aeration has improved with resolution of the opacity in the periphery  FINDINGS: The heart size and mediastinal contours are within normal limits. No active infiltrate or effusion is seen. There is mild peribronchial thickening present. The Port-A-Cath has been removed. Mild cardiomegaly is stable. Mild degenerative changes present throughout the thoracic spine  IMPRESSION: No active lung disease. Mild peribronchial thickening. Stable cardiomegaly.   Electronically Signed   By: Dwyane Dee M.D.   On: 06/08/2013 07:59   Ct Head Wo Contrast  06/26/2013   CLINICAL DATA:  Weakness, history of CVA.  EXAM: CT HEAD WITHOUT CONTRAST  TECHNIQUE: Contiguous axial images were obtained from the base of the skull through the vertex without  intravenous contrast.  COMPARISON:  06/21/2013  FINDINGS: There is further evolution and decrease in density of the left-sided deep white matter infarct centered in the region of the corona radiata and superior aspect of the basal ganglia. Advanced small vessel ischemic changes are noted in the periventricular white matter. The brain demonstrates no evidence of hemorrhage, new infarction, edema, mass effect, extra-axial fluid collection, hydrocephalus or mass lesion. The skull is unremarkable.  IMPRESSION: No new or acute findings. Further decrease in density of the left-sided deep white matter infarct centered in the corona radiata.   Electronically Signed   By: Irish Lack M.D.   On: 06/26/2013 15:03   Ct Head Wo Contrast  06/21/2013   ADDENDUM REPORT: 06/21/2013 13:16  ADDENDUM: Critical Value/emergent results were called by telephone at the time of interpretation on 06/21/2013 at 1:16 PM to Dr.Camilo, who verbally acknowledged these results.   Electronically Signed   By: Marlan Palau M.D.   On: 06/21/2013 13:16   06/21/2013   CLINICAL DATA:  Code stroke  EXAM: CT HEAD WITHOUT CONTRAST  TECHNIQUE: Contiguous axial images were obtained from the base of the skull through the vertex without intravenous contrast.  COMPARISON:  CT 06/16/2013  FINDINGS: Generalized atrophy. Chronic microvascular ischemic changes in the white matter. Chronic infarct in the deep white matter on the left is unchanged.  Negative for acute infarct. Negative for hemorrhage or mass lesion. Negative for skull lesion.  IMPRESSION: Chronic ischemic changes are stable from the prior CT. No acute abnormality.  Electronically Signed: By: Marlan Palau M.D. On: 06/21/2013 13:14   Ct Head Wo Contrast  06/16/2013   CLINICAL DATA:  Code stroke, left-sided weakness  EXAM: CT HEAD WITHOUT CONTRAST  TECHNIQUE: Contiguous axial images were obtained from the base of the skull through the vertex without intravenous contrast.  COMPARISON:   06/07/2013  FINDINGS: No skull fracture is noted. Atherosclerotic calcifications of carotid siphon again noted.  No intracranial hemorrhage, mass effect or midline shift. No definite acute cortical infarction. Stable cerebral atrophy. Again noted periventricular and subcortical white matter decreased attenuation consistent with chronic small vessel ischemic changes. Stable small lacunar infarct left lentiform nucleus and coronal radiata.  IMPRESSION: No intracranial hemorrhage, mass effect or midline shift. No definite acute cortical infarction. Stable cerebral atrophy. Again noted periventricular and subcortical white matter decreased attenuation consistent with chronic small vessel ischemic changes. Stable small lacunar infarct left lentiform nucleus and coronal radiata.   Electronically Signed   By: Natasha Mead M.D.   On: 06/16/2013 09:10   Ct Head (brain) Wo Contrast  06/07/2013   CLINICAL DATA:  Dysphasia. Nausea  EXAM: CT HEAD WITHOUT CONTRAST  TECHNIQUE: Contiguous axial images were obtained from the base  of the skull through the vertex without intravenous contrast.  COMPARISON:  CT 05/31/2013  FINDINGS: Generalized atrophy. Moderate chronic microvascular ischemic change in the white matter. Negative for acute infarct. Negative for hemorrhage or mass lesion. Ventricle size is normal. No shift of the midline structures. Calvarium is intact.  IMPRESSION: Chronic microvascular ischemic change. No acute abnormality.   Electronically Signed   By: Marlan Palau M.D.   On: 06/07/2013 10:17   Ct Head (brain) Wo Contrast  05/31/2013   CLINICAL DATA:  Code stroke.  Difficulty speaking  EXAM: CT HEAD WITHOUT CONTRAST  TECHNIQUE: Contiguous axial images were obtained from the base of the skull through the vertex without intravenous contrast.  COMPARISON:  05/27/2013  FINDINGS: There is patchy diffuse low-attenuation within the subcortical and periventricular white matter compatible with chronic microvascular  disease.  There is prominence of the sulci and ventricles consistent with brain atrophy. Evolutionary changes of subacute infarct within the left lentiform nucleus and coronal radiata is again noted.  No midline shift, ventriculomegaly, mass effect, evidence of mass lesion, intracranial hemorrhage or evidence of cortically based acute infarction. Gray-white matter differentiation is within normal limits throughout the brain.  The paranasal sinuses and mastoid air cells are clear. The skull is intact.  IMPRESSION: 1. Subacute infarct involving the left lentiform nucleus and coronal radiata. No acute intracranial abnormalities noted.  2. Chronic small vessel ischemic disease and brain atrophy.   Electronically Signed   By: Signa Kell M.D.   On: 05/31/2013 16:43   [name] Head Wo Contrast  06/07/2013   CLINICAL DATA:  Recurrent aphasia. Recent infarct. Patient is anticoagulated.  EXAM: MRI HEAD WITHOUT CONTRAST  MRA HEAD WITHOUT CONTRAST  TECHNIQUE: Multiplanar, multiecho pulse sequences of the brain and surrounding structures were obtained without intravenous contrast. Angiographic images of the head were obtained using MRA technique without contrast.  COMPARISON:  MR brain 05/31/2013. MR brain 05/27/2013. CT head earlier today.  FINDINGS: MRI HEAD FINDINGS  Pseudonormalization on DWI series of the more acute punctate nonhemorrhagic infarct (demonstrated on MR 05/31/2013) involving the posterior left coronal radiata. The older subacute infarct of the left lentiform nucleus and anterior coronal radiata shows typical evolutionary change with "normalized" peripheral hyperintensity on DWI sequence but some central restricted diffusion. No intervening hemorrhage within the infarct.  There is a subcentimeter focus of T1 shortening in the posterior putamen on the left. This could be located at the very lower most extent of the infarct based on prior images showing the area of acute restricted diffusion at its largest.  I cannot exclude a tiny focus of subacute microhemorrhage in the left putamen as this area has evolved compared with 10/25 and 10/29. This area is not visible on today's CT scan.  No new infarcts. Continued atrophy with chronic microvascular ischemic change.  MRA HEAD FINDINGS  Widely patent internal carotid arteries. Basilar artery widely patent with left vertebral dominant. Right vertebral maintains a hypoplastic connection to the basilar and appears to primarily supply the PICA. There is no proximal intracranial stenosis or aneurysm.  IMPRESSION: No new infarction is evident. Typical evolutionary change of the previously identified left deep white matter and basal ganglia infarcts.  There is a subcentimeter focus of T1 shortening, possible subacute hemorrhage, in the left posterior putamen marginally adjacent to the area of previous acute infarction displayed on 05/27/2013. Significance uncertain.  Unremarkable MRA intracranial circulation.   Electronically Signed   By: Davonna Belling M.D.   On: 06/07/2013 15:44  Mri Brain Without Contrast  06/22/2013   CLINICAL DATA:  79 year old female with history of stroke and frequent TIAs. Recent seizure, abnormal speech. Garbled speech. Initial encounter. History of atrial fibrillation.  EXAM: MRI HEAD WITHOUT CONTRAST  MRA HEAD WITHOUT CONTRAST  TECHNIQUE: Multiplanar, multiecho pulse sequences of the brain and surrounding structures were obtained without intravenous contrast. Angiographic images of the head were obtained using MRA technique without contrast.  COMPARISON:  06/07/2013 and earlier.  FINDINGS: MRI HEAD FINDINGS  Recent left corona radiata lacunar infarct with expected evolution on diffusion weighted imaging and other sequences. However, there are 2 new punctate areas of restricted diffusion in each hemisphere at the level of the centrum semiovale (right side series 5, image 23, left side image 22). No associated mass effect or hemorrhage. No other acute  diffusion abnormality identified.  Major intracranial vascular flow voids are stable.  Otherwise stable brain with confluent cerebral white matter T2 and FLAIR hyperintensity. No midline shift, mass effect, or evidence of intracranial mass lesion. No acute intracranial hemorrhage identified. Negative pituitary, cervicomedullary junction and stable visualized cervical spine. Normal bone marrow signal. Stable orbits soft tissues, paranasal sinuses, mastoids and scalp soft tissues.  MRA HEAD FINDINGS  Stable antegrade flow in the posterior circulation with dominant and somewhat dolichoectatic distal left vertebral artery. PICA origins remain patent. Basilar artery and AICA origins remain patent without stenosis. SCA and PCA origins are stable and within normal limits. Bilateral PCA branches are stable and within normal limits. Posterior communicating arteries are diminutive or absent as before.  Stable antegrade flow in both ICA siphons. Stable Mild ICA irregularity without stenosis. Ophthalmic artery origins are stable and normal. Carotid termini are stable and normal. MCA and ACA origins are stable and normal. Diminutive anterior communicating artery and visualized ACA branches remain within normal limits. Visualized bilateral MCA branches are stable and within normal limits.  IMPRESSION: 1. Expected evolution of the recent left corona radiata lacunar infarct, but now with 2 new punctate areas of restricted diffusion in the hemispheric white matter, 1 in each hemisphere, compatible with acute lacunar infarcts. Given the history of atrial fibrillation this suggests a recent mild embolic event.  2. No mass effect or acute hemorrhage and otherwise stable MRI appearance of the brain.  3. Stable and negative for age intracranial MRA.   Electronically Signed   By: Augusto Gamble M.D.   On: 06/22/2013 09:05   Mr Brain Wo Contrast  06/07/2013   CLINICAL DATA:  Recurrent aphasia. Recent infarct. Patient is anticoagulated.   EXAM: MRI HEAD WITHOUT CONTRAST  MRA HEAD WITHOUT CONTRAST  TECHNIQUE: Multiplanar, multiecho pulse sequences of the brain and surrounding structures were obtained without intravenous contrast. Angiographic images of the head were obtained using MRA technique without contrast.  COMPARISON:  MR brain 05/31/2013. MR brain 05/27/2013. CT head earlier today.  FINDINGS: MRI HEAD FINDINGS  Pseudonormalization on DWI series of the more acute punctate nonhemorrhagic infarct (demonstrated on MR 05/31/2013) involving the posterior left coronal radiata. The older subacute infarct of the left lentiform nucleus and anterior coronal radiata shows typical evolutionary change with "normalized" peripheral hyperintensity on DWI sequence but some central restricted diffusion. No intervening hemorrhage within the infarct.  There is a subcentimeter focus of T1 shortening in the posterior putamen on the left. This could be located at the very lower most extent of the infarct based on prior images showing the area of acute restricted diffusion at its largest. I cannot exclude a tiny focus of  subacute microhemorrhage in the left putamen as this area has evolved compared with 10/25 and 10/29. This area is not visible on today's CT scan.  No new infarcts. Continued atrophy with chronic microvascular ischemic change.  MRA HEAD FINDINGS  Widely patent internal carotid arteries. Basilar artery widely patent with left vertebral dominant. Right vertebral maintains a hypoplastic connection to the basilar and appears to primarily supply the PICA. There is no proximal intracranial stenosis or aneurysm.  IMPRESSION: No new infarction is evident. Typical evolutionary change of the previously identified left deep white matter and basal ganglia infarcts.  There is a subcentimeter focus of T1 shortening, possible subacute hemorrhage, in the left posterior putamen marginally adjacent to the area of previous acute infarction displayed on 05/27/2013.  Significance uncertain.  Unremarkable MRA intracranial circulation.   Electronically Signed   By: Davonna Belling M.D.   On: 06/07/2013 15:44   Mr Brain Wo Contrast  05/31/2013   CLINICAL DATA:  Code stroke. Recent infarcts 4 days ago. New onset of slurred speech today. Right lower extremity weakness and heaviness.  EXAM: MRI HEAD WITHOUT CONTRAST  TECHNIQUE: Multiplanar, multisequence MR imaging was performed. No intravenous contrast was administered.  COMPARISON:  MRI brain 05/27/2013  FINDINGS: The previously-seen acute infarct involving the right lentiform nucleus and coronal radiata is stable. There is a new punctate infarct involving the more posterior left coronal radiata. Atrophy and diffuse white matter disease is otherwise stable. No hemorrhage or mass lesion is present. The ventricles are proportionate to the degree of atrophy. Flow is present in the major intracranial arteries. The patient is status post bilateral lens extractions. The paranasal sinuses and mastoid air cells are clear. The degenerative changes of the upper cervical spine are again noted.  IMPRESSION: 1. New punctate nonhemorrhagic infarct involving the posterior left coronal radiata. 2. The acute infarct of the left lentiform nucleus and coronal radiata is otherwise stable. 3. Atrophy and diffuse white matter disease is also stable. 4. Moderate cervical spondylosis.   Electronically Signed   By: Gennette Pac M.D.   On: 05/31/2013 20:21   [name] Head/brain Wo Cm  06/22/2013   CLINICAL DATA:  79 year old female with history of stroke and frequent TIAs. Recent seizure, abnormal speech. Garbled speech. Initial encounter. History of atrial fibrillation.  EXAM: MRI HEAD WITHOUT CONTRAST  MRA HEAD WITHOUT CONTRAST  TECHNIQUE: Multiplanar, multiecho pulse sequences of the brain and surrounding structures were obtained without intravenous contrast. Angiographic images of the head were obtained using MRA technique without contrast.   COMPARISON:  06/07/2013 and earlier.  FINDINGS: MRI HEAD FINDINGS  Recent left corona radiata lacunar infarct with expected evolution on diffusion weighted imaging and other sequences. However, there are 2 new punctate areas of restricted diffusion in each hemisphere at the level of the centrum semiovale (right side series 5, image 23, left side image 22). No associated mass effect or hemorrhage. No other acute diffusion abnormality identified.  Major intracranial vascular flow voids are stable.  Otherwise stable brain with confluent cerebral white matter T2 and FLAIR hyperintensity. No midline shift, mass effect, or evidence of intracranial mass lesion. No acute intracranial hemorrhage identified. Negative pituitary, cervicomedullary junction and stable visualized cervical spine. Normal bone marrow signal. Stable orbits soft tissues, paranasal sinuses, mastoids and scalp soft tissues.  MRA HEAD FINDINGS  Stable antegrade flow in the posterior circulation with dominant and somewhat dolichoectatic distal left vertebral artery. PICA origins remain patent. Basilar artery and AICA origins remain patent without stenosis. SCA  and PCA origins are stable and within normal limits. Bilateral PCA branches are stable and within normal limits. Posterior communicating arteries are diminutive or absent as before.  Stable antegrade flow in both ICA siphons. Stable Mild ICA irregularity without stenosis. Ophthalmic artery origins are stable and normal. Carotid termini are stable and normal. MCA and ACA origins are stable and normal. Diminutive anterior communicating artery and visualized ACA branches remain within normal limits. Visualized bilateral MCA branches are stable and within normal limits.  IMPRESSION: 1. Expected evolution of the recent left corona radiata lacunar infarct, but now with 2 new punctate areas of restricted diffusion in the hemispheric white matter, 1 in each hemisphere, compatible with acute lacunar infarcts.  Given the history of atrial fibrillation this suggests a recent mild embolic event.  2. No mass effect or acute hemorrhage and otherwise stable MRI appearance of the brain.  3. Stable and negative for age intracranial MRA.   Electronically Signed   By: Augusto Gamble M.D.   On: 06/22/2013 09:05    Microbiology: Recent Results (from the past 240 hour(s))  CULTURE, BLOOD (ROUTINE X 2)     Status: None   Collection Time    06/26/13  4:15 PM      Result Value Range Status   Specimen Description BLOOD HAND LEFT   Final   Special Requests BOTTLES DRAWN AEROBIC ONLY 5CC   Final   Culture  Setup Time     Final   Value: 06/26/2013 21:18     Performed at [facility]   Culture     Final   Value:        BLOOD CULTURE RECEIVED NO GROWTH TO DATE CULTURE WILL BE HELD FOR 5 DAYS BEFORE ISSUING A FINAL NEGATIVE REPORT     Performed at [facility]   Report Status PENDING   Incomplete  CULTURE, BLOOD (ROUTINE X 2)     Status: None   Collection Time    06/26/13  7:30 PM      Result Value Range Status   Specimen Description BLOOD LEFT HAND   Final   Special Requests BOTTLES DRAWN AEROBIC ONLY 6CC   Final   Culture  Setup Time     Final   Value: 06/27/2013 01:08     Performed at [facility]   Culture     Final   Value:        BLOOD CULTURE RECEIVED NO GROWTH TO DATE CULTURE WILL BE HELD FOR 5 DAYS BEFORE ISSUING A FINAL NEGATIVE REPORT     Performed at [facility]   Report Status PENDING   Incomplete     Labs: Basic Metabolic Panel:  Recent Labs Lab 06/27/13 0640 06/29/13 0620 06/30/13 0455  NA 139 139 141  K 3.4* 3.4* 3.5  CL 97 100 103  CO2 24 27 26   GLUCOSE 141* 109* 92  BUN 18 29* 22  CREATININE 1.14* 1.81* 1.46*  CALCIUM 10.3 10.0 9.8   Liver Function Tests: No results found for this basename: AST, ALT, ALKPHOS, BILITOT, PROT, ALBUMIN,  in the last 168 hours No results found for this basename: LIPASE, AMYLASE,  in the last 168 hours No results found  for this basename: AMMONIA,  in the last 168 hours CBC:  Recent Labs Lab 06/26/13 1458 06/26/13 1930 06/27/13 0640 06/28/13 0525 06/29/13 0620  WBC 2.6* 4.5 7.5 7.3 6.1  NEUTROABS 1.5*  --   --   --   --  HGB 7.2* 12.6 15.7* 14.5 14.3  HCT 22.0* 37.7 46.2* 43.6 43.6  MCV 90.2 88.1 88.3 89.9 88.4  PLT 97* 159 190 185 189   Cardiac Enzymes: No results found for this basename: CKTOTAL, CKMB, CKMBINDEX, TROPONINI,  in the last 168 hours BNP: BNP (last 3 results) No results found for this basename: PROBNP,  in the last 8760 hours CBG:  Recent Labs Lab 06/26/13 1348  GLUCAP 100*       Signed:  Doralene Glanz A  [facility] 06/30/2013, 12:02 PM

## 2013-06-30 NOTE — Telephone Encounter (Signed)
Patient Information:  Caller Name: Molli Hazard  Phone: [phone]  Patient: [name]  Gender: Female  DOB: 04/12/1928  Age: 79 Years  PCP: Darryll Capers (Adults only)  Office Follow Up:  Does the office need to follow up with this patient?: No  Instructions For The Office: N/A  RN Note:  Madlyn Frankel to tell pt's RN he would like to speak with her MD before pt's d/c to discuss is reservations and concerns. Also advised that an appt needed to wait until pt was actually d/c as 1. There is no availability in [facility] today and 2. Ninfa Meeker is open on Sat, 07/01/13 for any urgent needs. Matt to f/u with MD in hospital now.  Symptoms  Reason For Call & Symptoms: Matthew/son calling stating pt is in the hospital for TIA's and seizures and the hospital wants to d/c her today on Keppra. Want's an appt in office today for f/u. Pt has not been d/c yet.  Reviewed Health History In EMR: N/A  Reviewed Medications In EMR: N/A  Reviewed Allergies In EMR: N/A  Reviewed Surgeries / Procedures: N/A  Date of Onset of Symptoms: 06/30/2013  Guideline(s) Used:  No Protocol Available - Sick Adult  Disposition Per Guideline:   See Within 3 Days in Office  Reason For Disposition Reached:   Nursing judgment  Advice Given:  N/A  Patient Will Follow Care Advice:  YES

## 2013-06-30 NOTE — Progress Notes (Signed)
SATURATION QUALIFICATIONS: (This note is used to comply with regulatory documentation for home oxygen)  Patient Saturations on Room Air at Rest = 95%  Patient Saturations on Room Air while Ambulating = 87%  Patient Saturations on 2 Liters of oxygen while Ambulating = 95%  Please briefly explain why patient needs home oxygen: 

## 2013-06-30 NOTE — Progress Notes (Addendum)
Physical Therapy Treatment Patient Details Name: [name] MRN: [id number] DOB: 04/27/1928 Today's Date: 06/30/2013 Time: 7829-5621 PT Time Calculation (min): 31 min  PT Assessment / Plan / Recommendation  History of Present Illness  [name] is a 79 y.o. female with PMH of stroke and frequent admissions for TIAs, recent seizure, hypertension hyperlipidemia, COPD, atrial fibrillation requiring anticoagulation that presents emergency room complaining of generalized weakness, increased SOB and non productive cough.  Husband reports about 4 admissions in past month with pt becoming progressively weaker especially on right side.   PT Comments   Pt. Is going to DC home as no insurance verification is available for rehab admission per pt. Husband.  He reports he will have 24hour assist for pt. Between himself and his 2 daughters.  She is making gains in her mobility and will need HHPT follow up at DC.  Follow up recommendations changed to reflect change in plans.  Follow Up Recommendations  Home health PT;Supervision/Assistance - 24 hour;Supervision for mobility/OOB;Other (comment) (insurance verfifcation not available, pt. Dcing home)     Does the patient have the potential to tolerate intense rehabilitation     Barriers to Discharge        Equipment Recommendations  w/c    Recommendations for Other Services    Frequency Min 3X/week   Progress towards PT Goals Progress towards PT goals: Progressing toward goals  Plan Discharge plan needs to be updated    Precautions / Restrictions Precautions Precautions: Fall Restrictions Weight Bearing Restrictions: No   Pertinent Vitals/Pain See vitals tab    Mobility  Bed Mobility Bed Mobility: Supine to Sit;Sitting - Scoot to Edge of Bed Supine to Sit: 4: Min assist;HOB elevated;With rails Sitting - Scoot to Edge of Bed: 4: Min assist Sit to Supine: 4: Min assist Details for Bed Mobility Assistance: min assist to trnaslate  to sit and supine Transfers Transfers: Sit to Stand;Stand to Sit Sit to Stand: 4: Min assist;From bed;Without upper extremity assist Stand to Sit: 4: Min assist;To bed;With upper extremity assist Details for Transfer Assistance: min assist for rising to stand and to control descent Ambulation/Gait Ambulation/Gait Assistance: 4: Min assist Ambulation Distance (Feet): 125 Feet Assistive device: Rolling walker Ambulation/Gait Assistance Details: pt. with improved steadiness with use of RW, though she needed assist to correct self once when she was about to run into wall on L side Gait Pattern: Step-through pattern;Decreased dorsiflexion - right;Decreased hip/knee flexion - right    Exercises     PT Diagnosis:    PT Problem List:   PT Treatment Interventions:     PT Goals (current goals can now be found in the care plan section)    Visit Information  Last PT Received On: 06/30/13 Assistance Needed: +2 (second person to manage IV/O2 when applied and check o2 sats) History of Present Illness:  [name] is a 79 y.o. female with PMH of stroke and frequent admissions for TIAs, recent seizure, hypertension hyperlipidemia, COPD, atrial fibrillation requiring anticoagulation that presents emergency room complaining of generalized weakness, increased SOB and non productive cough.  Husband reports about 4 admissions in past month with pt becoming progressively weaker especially on right side.    Subjective Data  Subjective: Pt. agreeable to therapy   Cognition  Cognition Arousal/Alertness: Awake/alert Behavior During Therapy: Flat affect Overall Cognitive Status: Impaired/Different from baseline Area of Impairment: Memory;Following commands;Attention Current Attention Level: Sustained Memory: Decreased short-term memory Following Commands: Follows one step commands inconsistently Safety/Judgement: Decreased awareness  of safety;Decreased awareness of deficits Problem Solving: Slow  processing    Balance     End of Session PT - End of Session Equipment Utilized During Treatment: Gait belt;Oxygen (O2 at midpoint due to drop in sats to 87% on RA) Activity Tolerance: Patient tolerated treatment well;Patient limited by fatigue Patient left: in bed;with call bell/phone within reach;with family/visitor present (at pt request) Nurse Communication: Mobility status;Other (comment) (O2 sats as documented)   GP     Ferman Hamming 06/30/2013, 3:30 PM Weldon Picking PT Acute Rehab Services [phone] Beeper [phone]

## 2013-06-30 NOTE — Clinical Social Work Note (Signed)
Per daughter and patient's husband. Family is not happy with SNF bed options and will be taking patient home. RNCM notified of patient's need for HHPT services. CSW signing off.   [name], [name], [name], [phone]

## 2013-06-30 NOTE — Progress Notes (Signed)
   CARE MANAGEMENT NOTE 06/30/2013  Patient:  [name], [name]   Account Number:  [id number]  Date Initiated:  06/27/2013  Documentation initiated by:  Letha Cape  Subjective/Objective Assessment:   dx acute respiratory failure  admit- lives with spouse.     Action/Plan:   Palliative meeting 11/25  pt eval- rec CIR vs SNF.   Anticipated DC Date:  06/29/2013   Anticipated DC Plan:  IP REHAB FACILITY      DC Planning Services  CM consult      [facility] Choice  HOME HEALTH   Choice offered to / List presented to:  C-4 Adult Children   DME arranged  WHEELCHAIR - MANUAL  OXYGEN        HH arranged  HH-2 PT  HH-1 RN  HH-6 SOCIAL WORKER  HH-4 NURSE'S AIDE      HH agency  [facility]   Status of service:  Completed, signed off Medicare Important Message given?   (If response is "NO", the following Medicare IM given date fields will be blank) Date Medicare IM given:   Date Additional Medicare IM given:    Discharge Disposition:  HOME W HOME HEALTH SERVICES  Per UR Regulation:  Reviewed for med. necessity/level of care/duration of stay  If discussed at Long Length of Stay Meetings, dates discussed:    Comments:  06/30/2013 [facility] wasn't able to accept pt and [facility] did not have bed availability. Dtr did not like other options. Plans to take home with [facility]. Pt active with [facility], -CSW, aide and PT added to [facility] orders. Pt will need wheelchair and oxygen for home. She has bedside commode and RW at home. Notified [facility] rep for [facility] and DME rep for equipment for home. NCM spoke to husband and explained Home Health CSW can work with them on options once dc for SNF placement if pt was not able to stay at home with [facility]. Pt states she wants to go home. Her husband and daughters will be able to provide 24 hour supervision. Isidoro Donning RN Case Mgmt phone [phone]  06/30/2013 1030 NCM spoke with dtr, Clydie Braun and husband. Explained that IP Rehab RN reviewed pt's case. IP  rehab was not option and pt's would have to review SNF-rehab options. CSW presented dtr and husband with bed offers for SNF. Dtr will go and look at facilities. Isidoro Donning RN CCM Case Mgmt phone [phone]  06/27/13 15:26 Letha Cape RN, BSN [phone] patient lives with spouse, per physical therapy recs CIR vs SNF.  CIR has been consulted.  Palliative has also seen patient which patient wants CIR as well.  CSW following for SNF as well.   Await CIR recommendations.  NCM will continue to follow for dc needs.

## 2013-06-30 NOTE — Progress Notes (Signed)
ANTIBIOTIC CONSULT NOTE - FOLLOW UP  Pharmacy Consult for zosyn Indication: pneumonia  Allergies  Allergen Reactions  . Ativan [Lorazepam] Other (See Comments)    Severe hallucinations   . Propoxyphene Hcl     Rxn: unknown  . Vancomycin     Patient Measurements: Height: 5\' 4"  (162.6 cm) Weight: 185 lb 9.6 oz (84.188 kg) IBW/kg (Calculated) : 54.7 Temp: 98.1 F (36.7 C) (11/28 0514) Temp src: Oral (11/28 0514) BP: 113/75 mmHg (11/28 0514) Pulse Rate: 85 (11/28 0514) Intake/Output from previous day: 11/27 0701 - 11/28 0700 In: 360 [P.O.:360] Out: -  Intake/Output from this shift:    Labs:  Recent Labs  06/28/13 0525 06/29/13 0620 06/30/13 0455  WBC 7.3 6.1  --   HGB 14.5 14.3  --   PLT 185 189  --   CREATININE  --  1.81* 1.46*   Estimated Creatinine Clearance: 30.6 ml/min (by C-G formula based on Cr of 1.46). No results found for this basename: VANCOTROUGH, VANCOPEAK, VANCORANDOM, GENTTROUGH, GENTPEAK, GENTRANDOM, TOBRATROUGH, TOBRAPEAK, TOBRARND, AMIKACINPEAK, AMIKACINTROU, AMIKACIN,  in the last 72 hours   Microbiology: Recent Results (from the past 720 hour(s))  CULTURE, BLOOD (ROUTINE X 2)     Status: None   Collection Time    06/26/13  4:15 PM      Result Value Range Status   Specimen Description BLOOD HAND LEFT   Final   Special Requests BOTTLES DRAWN AEROBIC ONLY 5CC   Final   Culture  Setup Time     Final   Value: 06/26/2013 21:18     Performed at [facility]   Culture     Final   Value:        BLOOD CULTURE RECEIVED NO GROWTH TO DATE CULTURE WILL BE HELD FOR 5 DAYS BEFORE ISSUING A FINAL NEGATIVE REPORT     Performed at [facility]   Report Status PENDING   Incomplete  CULTURE, BLOOD (ROUTINE X 2)     Status: None   Collection Time    06/26/13  7:30 PM      Result Value Range Status   Specimen Description BLOOD LEFT HAND   Final   Special Requests BOTTLES DRAWN AEROBIC ONLY 6CC   Final   Culture  Setup Time     Final   Value: 06/27/2013 01:08     Performed at [facility]   Culture     Final   Value:        BLOOD CULTURE RECEIVED NO GROWTH TO DATE CULTURE WILL BE HELD FOR 5 DAYS BEFORE ISSUING A FINAL NEGATIVE REPORT     Performed at [facility]   Report Status PENDING   Incomplete      Assessment: Day # 5 of zosyn for HCAP vs aspiration PNA.  Vanc stopped 11/25.  AF 11/24 BC ngtd. Creat down to 1.46 from 1.81 yesterday.  WBC 6.1 yesterday.   Goal of Therapy:  Treat PNA  Plan:  1. Continue zosyn 3.375 gm IV q8h, infuse each dose over 4 hours 2. F/u for de-escalation of abx therapy 3. F/u renal fxn, temp, wbc, CXR, culture data, plans Herby Abraham, Pharm.D. [phone] 06/30/2013 11:17 AM

## 2013-06-30 NOTE — Progress Notes (Signed)
NURSING PROGRESS NOTE  [name] [id number] Discharge Data: 06/30/2013 5:51 PM Attending Provider: Clydia Llano, MD WGN:[id number] [name], MD     [name] to be D/C'd Home per MD order.  Discussed with the patient the After Visit Summary and all questions fully answered. All IV's discontinued with no bleeding noted. All belongings returned to patient for patient to take home.   Last Vital Signs:  Blood pressure 117/74, pulse 87, temperature 98.5 F (36.9 C), temperature source Oral, resp. rate 18, height 5\' 4"  (1.626 m), weight 84.188 kg (185 lb 9.6 oz), SpO2 87.00%.  Discharge Medication List   Medication List         ALPRAZolam 0.5 MG tablet  Commonly known as:  XANAX  Take 1 tablet (0.5 mg total) by mouth at bedtime as needed for sleep.     amoxicillin-clavulanate 875-125 MG per tablet  Commonly known as:  AUGMENTIN  Take 1 tablet by mouth 2 (two) times daily.     atorvastatin 10 MG tablet  Commonly known as:  LIPITOR  Take 1 tablet (10 mg total) by mouth daily at 6 PM.     bisoprolol 5 MG tablet  Commonly known as:  ZEBETA  Take 5 mg by mouth daily at 12 noon.     DULoxetine 30 MG capsule  Commonly known as:  CYMBALTA  Take 1 capsule (30 mg total) by mouth daily.     levETIRAcetam 750 MG tablet  Commonly known as:  KEPPRA  Take 1 tablet (750 mg total) by mouth 2 (two) times daily.     loperamide 2 MG capsule  Commonly known as:  IMODIUM  Take 2 mg by mouth daily as needed for diarrhea or loose stools.     neomycin-polymyxin b-dexamethasone 3.5-10000-0.1 Susp  Commonly known as:  MAXITROL  Place 1 drop into the left eye every 6 (six) hours.     Rivaroxaban 15 MG Tabs tablet  Commonly known as:  XARELTO  Take 1 tablet (15 mg total) by mouth daily after supper.     VITAMIN B12 PO  Take 5,000 mcg by mouth daily.     Vitamin D (Ergocalciferol) 50000 UNITS Caps capsule  Commonly known as:  DRISDOL  Take 50,000 Units by mouth every Sunday.

## 2013-06-30 NOTE — Progress Notes (Signed)
Pt has rested comfortably through the night with no complications.

## 2013-06-30 NOTE — Telephone Encounter (Signed)
Emergent call on d/c when rec'd by triager. Wrong call back # and tried to reach cell phone on file. LM on unidentified vm of cell number.

## 2013-07-02 LAB — CULTURE, BLOOD (ROUTINE X 2)

## 2013-07-03 LAB — CULTURE, BLOOD (ROUTINE X 2): Culture: NO GROWTH

## 2013-07-04 ENCOUNTER — Ambulatory Visit: Payer: Medicare Other | Admitting: Physical Therapy

## 2013-07-04 ENCOUNTER — Ambulatory Visit: Payer: Medicare Other | Admitting: Speech Pathology

## 2013-07-06 ENCOUNTER — Ambulatory Visit: Payer: Medicare Other | Admitting: Physical Therapy

## 2013-07-07 ENCOUNTER — Encounter: Payer: Self-pay | Admitting: Internal Medicine

## 2013-07-07 ENCOUNTER — Telehealth: Payer: Self-pay | Admitting: Internal Medicine

## 2013-07-07 ENCOUNTER — Ambulatory Visit (INDEPENDENT_AMBULATORY_CARE_PROVIDER_SITE_OTHER): Payer: Medicare Other | Admitting: Internal Medicine

## 2013-07-07 VITALS — BP 132/82 | Temp 98.2°F | Ht 64.0 in | Wt 185.0 lb

## 2013-07-07 DIAGNOSIS — G459 Transient cerebral ischemic attack, unspecified: Secondary | ICD-10-CM

## 2013-07-07 DIAGNOSIS — J189 Pneumonia, unspecified organism: Secondary | ICD-10-CM

## 2013-07-07 DIAGNOSIS — I10 Essential (primary) hypertension: Secondary | ICD-10-CM

## 2013-07-07 DIAGNOSIS — L270 Generalized skin eruption due to drugs and medicaments taken internally: Secondary | ICD-10-CM

## 2013-07-07 MED ORDER — PREDNISONE 20 MG PO TABS: ORAL_TABLET | ORAL | Status: DC

## 2013-07-07 NOTE — Patient Instructions (Signed)
Please contact our office if your symptoms do not improve or gets worse. Follow up with Dr. Lovell Sheehan within 2 weeks

## 2013-07-07 NOTE — Telephone Encounter (Signed)
Patient Information:  Caller Name: Clydie Braun  Phone: [phone]  Patient: [name], [name]  Gender: Female  DOB: 10/21/1927  Age: 79 Years  PCP: Darryll Capers (Adults only)  Office Follow Up:  Does the office need to follow up with this patient?: No  Instructions For The Office: N/A  RN Note:  ER CALL.  Rash on Torso and Face, evlauted by Home Health Nurse on 12-5, RN advised Pt to be seen today.  Daughter is not w/ Pt, unable to assess Rash.  Pt has history of being seen at Hospital 5 x in 1 week, has had 2 TIA.  No availability w/ Dr Lovell Sheehan, appt scheduled w/ Dr Artist Pais at [phone] on 12-5.  Daughter verbalized understanding.  Symptoms  Reason For Call & Symptoms: ER CALL.  Rash on Torso and Face  Reviewed Health History In EMR: N/A  Reviewed Medications In EMR: N/A  Reviewed Allergies In EMR: N/A  Reviewed Surgeries / Procedures: N/A  Date of Onset of Symptoms: 07/06/2013  Guideline(s) Used:  No Protocol Available - Sick Adult  Disposition Per Guideline:   See Today in Office  Reason For Disposition Reached:   Nursing judgment  Advice Given:  N/A  Patient Will Follow Care Advice:  YES  Appointment Scheduled:  07/07/2013 15:45:00 Appointment Scheduled Provider:  Artist Pais, Doe-Hyun Molly Maduro) (Adults only)

## 2013-07-07 NOTE — Assessment & Plan Note (Signed)
79 year old white female with probable drug rash/eruption. I suspect penicillin is etiology. Treat with prednisone taper over the next 12 days. Patient has followup with her neurologist. If persistent rash, Keppra may also be a trigger.

## 2013-07-07 NOTE — Telephone Encounter (Signed)
FYI

## 2013-07-07 NOTE — Progress Notes (Signed)
Pre visit review using our clinic review tool, if applicable. No additional management support is needed unless otherwise documented below in the visit note. 

## 2013-07-07 NOTE — Assessment & Plan Note (Signed)
Patient on xarelto.  Monitor CBCD and renal function.

## 2013-07-07 NOTE — Progress Notes (Signed)
Subjective:    Patient ID: [name], female    DOB: 1927/12/30, 79 y.o.   MRN: [id number]  HPI  79 year old white female with complex medical history for hospital followup. Patient admitted on 06/24/1999 1414 a stroke. She later presented on the 24th with shortness of breath coughing and generalized weakness. Patient found to have pneumonia she was treated with IV vancomycin and Zosyn. Her hospital course was complicated by allergic reaction to vancomycin. 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  Patient also diagnosed with new onset seizure disorder approximately 1 to 2 months ago. She has been evaluated by neurologist during her hospitalization and started on Keppra. Her Keppra dose has been upwardly titrated over last 2-3 weeks.  She was discharged on Augmentin today for 5 days after hospital discharge.  Her husband and daughter noticed that she started itching even while she was in the hospital near her abdomen. Her rash got gradually worse over the last week. She describes pruritic area of near abdomen chest and neck. She denies any tongue or lip swelling.  Rash is not painful  Review of Systems Negative for fever chills, negative for shortness of breath    Past Medical History  Diagnosis Date  . GERD (gastroesophageal reflux disease)   . History of TIAs   . Diverticulosis   . PUD (peptic ulcer disease)   . PVD (peripheral vascular disease)   . Hyperlipidemia   . Anxiety   . Depression   . Blood transfusion     "w/1st miscarriage" (05/26/2013)  . COPD (chronic obstructive pulmonary disease)   . Hypertension   . PONV (postoperative nausea and vomiting)   . Family history of anesthesia complication     "sisters also get sick as a dog" (05/26/2013)  . Coronary artery disease   . Heart murmur     "when I was a child" (05/26/2013)  . DVT (deep venous thrombosis) 1954    "after I had my first child" (05/26/2013)  . Pneumonia 2013    "once" (05/26/2013)  . Exertional shortness  of breath   . H/O hiatal hernia   . Stroke 10/2002    no residual  . Stroke 05/26/2013    "very mild; affected my speech for a while" (05/26/2013)  . Arthritis     "lots; hands" (05/26/2013)  . 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 Lymphona, mantle cell, inguinal region/lower limb     "large c-cell" (05/26/2013)    History   Social History  . Marital Status: Married    Spouse Name: N/A    Number of Children: 6  . Years of Education: N/A   Occupational History  . retired    Social History Main Topics  . Smoking status: Former Smoker -- 2.50 packs/day for 37 years    Types: Cigarettes    Quit date: 09/05/1984  . Smokeless tobacco: Never Used  . Alcohol Use: No  . Drug Use: No  . Sexual Activity: Not Currently   Other Topics Concern  . Not on file   Social History Narrative  . No narrative on file    Past Surgical History  Procedure Laterality Date  . 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 Tubal ligation  1972  . Radical neck dissection  1986    "larynx cancer" (05/26/2013)  . Total knee arthroplasty Bilateral     2002 left 2008 right  . Ileostomy  closure  01/2010  . Cataract extraction  2005    bilateral  . Carotid endarterectomy Bilateral 2004    2004 a month apart right and left  . Tonsillectomy and adenoidectomy  1936    "tonsils grew back; no 2nd OR" (05/26/2013)  . Hernia repair  05/2010    ventral hernia repair  . Carpal tunnel release Left ?1960's  . Abdominal exploration surgery  12/01/2012  . Appendectomy  09/13/2008  . Colon surgery  09/13/2008    Laparoscopic assisted converted to open sigmoid colectomy.Hattie Perch 09/13/2008 (05/26/2013)  . Cataract extraction w/ intraocular lens  implant, bilateral Bilateral ~ 2003  . Dilation and curettage of uterus  1950's    "had 2 for miscarriages" (05/26/2013)    Family History  Problem Relation Age of  Onset  . Colon cancer Sister     colon  . Hyperlipidemia Mother   . Stroke Mother   . Cancer Father     mesothelioma    Allergies  Allergen Reactions  . Ativan [Lorazepam] Other (See Comments)    Severe hallucinations   . Propoxyphene Hcl     Rxn: unknown  . Vancomycin   . Penicillins Rash    Current Outpatient Prescriptions on File Prior to Visit  Medication Sig Dispense Refill  . ALPRAZolam (XANAX) 0.5 MG tablet Take 1 tablet (0.5 mg total) by mouth at bedtime as needed for sleep.  30 tablet  0  . atorvastatin (LIPITOR) 10 MG tablet Take 1 tablet (10 mg total) by mouth daily at 6 PM.  30 tablet  0  . bisoprolol (ZEBETA) 5 MG tablet Take 5 mg by mouth daily at 12 noon.       . Cyanocobalamin (VITAMIN B12 PO) Take 5,000 mcg by mouth daily.      Marland Kitchen levETIRAcetam (KEPPRA) 750 MG tablet Take 1 tablet (750 mg total) by mouth 2 (two) times daily.  60 tablet  0  . loperamide (IMODIUM) 2 MG capsule Take 2 mg by mouth daily as needed for diarrhea or loose stools.       Marland Kitchen neomycin-polymyxin b-dexamethasone (MAXITROL) 3.5-10000-0.1 SUSP Place 1 drop into the left eye every 6 (six) hours.  1 Bottle  0  . Vitamin D, Ergocalciferol, (DRISDOL) 50000 UNITS CAPS Take 50,000 Units by mouth every Sunday.       No current facility-administered medications on file prior to visit.    BP 132/82  Temp(Src) 98.2 F (36.8 C) (Oral)  Ht 5\' 4"  (1.626 m)  Wt 185 lb (83.915 kg)  BMI 31.74 kg/m2    Objective:   Physical Exam  Constitutional: She is oriented to person, place, and time. She appears well-developed and well-nourished. No distress.  HENT:  Head: Normocephalic and atraumatic.  Mouth/Throat: Oropharynx is clear and moist.  No tongue or lip swelling  Eyes: Conjunctivae and EOM are normal. Pupils are equal, round, and reactive to light.  Neck: Neck supple.  Cardiovascular: Normal rate, regular rhythm and normal heart sounds.   No murmur heard. Pulmonary/Chest: Effort normal and breath  sounds normal. She has no wheezes.  Musculoskeletal: She exhibits no edema.  Lymphadenopathy:    She has no cervical adenopathy.  Neurological: She is alert and oriented to person, place, and time. No cranial nerve deficit.  Skin:  Diffuse macular papular rash on upper chest, neck lower face and back  Psychiatric: She has a normal mood and affect. Her behavior is normal.          Assessment &  Plan:

## 2013-07-07 NOTE — Assessment & Plan Note (Signed)
Continue bisoprolol.  BP: 132/82 mmHg  Lab Results  Component Value Date   NA 141 06/30/2013   K 3.5 06/30/2013   CL 103 06/30/2013   CO2 26 06/30/2013   Lab Results  Component Value Date   CREATININE 1.46* 06/30/2013

## 2013-07-07 NOTE — Assessment & Plan Note (Signed)
Pneumonia clinically improved. She finished a 5 day outpatient course of Augmentin 875/125 twice daily.  Lung exam is clear.  List penicillin as allergy.

## 2013-07-10 ENCOUNTER — Ambulatory Visit (INDEPENDENT_AMBULATORY_CARE_PROVIDER_SITE_OTHER): Payer: Medicare Other | Admitting: Neurology

## 2013-07-10 ENCOUNTER — Encounter: Payer: Self-pay | Admitting: Neurology

## 2013-07-10 VITALS — BP 128/76 | HR 68 | Temp 98.1°F | Ht 64.5 in | Wt 182.0 lb

## 2013-07-10 DIAGNOSIS — L270 Generalized skin eruption due to drugs and medicaments taken internally: Secondary | ICD-10-CM

## 2013-07-10 DIAGNOSIS — G43109 Migraine with aura, not intractable, without status migrainosus: Secondary | ICD-10-CM

## 2013-07-10 DIAGNOSIS — I639 Cerebral infarction, unspecified: Secondary | ICD-10-CM

## 2013-07-10 DIAGNOSIS — G40209 Localization-related (focal) (partial) symptomatic epilepsy and epileptic syndromes with complex partial seizures, not intractable, without status epilepticus: Secondary | ICD-10-CM

## 2013-07-10 DIAGNOSIS — I6340 Cerebral infarction due to embolism of unspecified cerebral artery: Secondary | ICD-10-CM

## 2013-07-10 NOTE — Patient Instructions (Signed)
1.  Continue Xarelto for secondary stroke prevention. 2.  We will continue the Keppra 750mg  twice daily for now.  The rash seems to be improving, however you are still on prednisone.  We will monitor if the rash continues to improve once the prednisone is finished.  If the rash worsens again, then we will have to switch the Keppra to another anti-seizure medication. 3.  Use the walker AT ALL TIMES.  You are unsteady on your feet and you are on a heavy blood thinner.  If you fall, then you can develop significant bleeding such as in the head, which can be lethal. 4.  Eat a Mediterranean diet, drink plenty of water. 5.  Follow up in 4 weeks.  Call sooner with any questions or concerns.

## 2013-07-10 NOTE — Progress Notes (Signed)
NEUROLOGY CONSULTATION NOTE  [name] MRN: [id number] DOB: 1927-08-09  Referring provider: Adline Mango, FNP Primary care provider: Darryll Capers, MD  Reason for consult:  Recent strokes and seizure.  HISTORY OF PRESENT ILLNESS: [name] is an 79 year old right-handed woman with history of hypertension, atrial fibrillation, peripheral vascular disease, anxiety, depression, B Cell lymphoma status post chemotherapy, and COPD who presents for recent history of stroke and seizure.  She is accompanied by her husband and her daughter.  Records and images were personally reviewed where available.    She presented to the ED on 05/24/13 with confusion.  She had a negative head CT and was discharged.  She returned to the ED on 05/26/13 with aphasia.  An MRI of the brain was performed, which revealed an acute lacunar infarct in the left anterior corona radiata and lentiform nucleus.  CTA of the head revealed atherosclerotic changes in the cavernous carotid arteries without significant stenosis.  CTA of neck revealed focal 50% stenosis of mid right ICA.  2D Echo revealed 50-55% EF with no source of embolus.  Hgb A1c 5.9, LDL 97.  She was switched from [name] to Eliquis.  She was readmitted to the hospital on 05/31/13 with recurrent aphasia.  She had trouble getting her words out and then couldn't speak at all.  Repeat MRI of brain revealed new punctate infarct in the left posterior corona radiata, the same location as previous infarct.  No changes in management were made.  She was readmitted to the hospital on 06/07/13 for altered mental status.  During the night, her husband found her standing naked in the closet putting on a summer dress with a sweater hanging out of one arm.  She was amnestic to this event.  When she woke up early in the morning, she exhibited again expressive aphasia, gait imbalance and nausea.  MRI of brain revealed pseudonormalization on DWI of the more acute  punctate infarct involving the posterior left corona radiata as well as typical evolution of the older subacute infarct involving the left lentiform nucleus and anterior corona radiata.  EEG was performed, which revealed intermittent infrequent left fronto-parieto-temporal theta slowing.  Due to possibility of seizure, she was started on Keppra 250mg  BID.  No change made to Eliquis.  She presented to the ED on 06/16/13 after waking up in the morning with expressive aphasia and right-sided weakness.  Symptoms improved in the ED.  CT head revealed prior lacunar infarct in left lentiform nucleus and corona radiata, but no new abnormalities.  It was suspected that she may have had another seizure and her Keppra was increased to 500mg  BID.  She was admitted to the hospital again on 06/21/13 again for expressive aphasia.  MRI brain revealed 2 new punctate infarcts in the bilateral hemispheres at level of centrum semiovale.  MRA of head was stable.  Carotid dopplers revealed right 1-39% ICA stenosis and left 20-49% ICA stenosis.  2D echo stable.  Eliquis was changed to Xarelto.  Keppra was increased to 750mg  BID.  A statin was added, as well.  She developed lethargy, so her Cymbalta and statin was discontinued and she felt better.  She was readmitted to the hospital on 06/26/13 for generalized weakness and shortness of breath.  She was found to have pneumonia and started on antibiotics.  A couple of days after finishing the antibiotics, she developed a significant maculopapular rash on her neck and chest.  She was started on prednisone, and it is  slowly improving.  Previously, she had a erythemic rash across her forehead, thought to be secondary to Vancomycin.  Throughout all her hospital stays, she has been rate-controlled.  Due to stroke and pneumonia, she was undergone deconditioning and currently will be having physical therapy.  She has a walker but doesn't use it all the time.  She does not have a walker now.   She has history of gait problems after knee surgery, but she has been more unsteady since her prolonged hospital admissions.  Of note, she has longstanding history of migraines, described as scotomas and flashing lights in the right eye.  There is no associated headache.  PAST MEDICAL HISTORY: Past Medical History  Diagnosis Date  . GERD (gastroesophageal reflux disease)   . History of TIAs   . Diverticulosis   . PUD (peptic ulcer disease)   . PVD (peripheral vascular disease)   . Hyperlipidemia   . Anxiety   . Depression   . Blood transfusion     "w/1st miscarriage" (05/26/2013)  . COPD (chronic obstructive pulmonary disease)   . Hypertension   . PONV (postoperative nausea and vomiting)   . Family history of anesthesia complication     "sisters also get sick as a dog" (05/26/2013)  . Coronary artery disease   . Heart murmur     "when I was a child" (05/26/2013)  . DVT (deep venous thrombosis) 1954    "after I had my first child" (05/26/2013)  . Pneumonia 2013    "once" (05/26/2013)  . Exertional shortness of breath   . H/O hiatal hernia   . Stroke 10/2002    no residual  . Stroke 05/26/2013    "very mild; affected my speech for a while" (05/26/2013)  . Arthritis     "lots; hands" (05/26/2013)  . 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 Lymphona, mantle cell, inguinal region/lower limb     "large c-cell" (05/26/2013)    PAST SURGICAL HISTORY: Past Surgical History  Procedure Laterality Date  . 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 Tubal ligation  1972  . Radical neck dissection  1986    "larynx cancer" (05/26/2013)  . Total knee arthroplasty Bilateral     2002 left 2008 right  . Ileostomy closure  01/2010  . Cataract extraction  2005    bilateral  . Carotid endarterectomy Bilateral 2004    2004 a month apart right and left  . Tonsillectomy and adenoidectomy   1936    "tonsils grew back; no 2nd OR" (05/26/2013)  . Hernia repair  05/2010    ventral hernia repair  . Carpal tunnel release Left ?1960's  . Abdominal exploration surgery  12/01/2012  . Appendectomy  09/13/2008  . Colon surgery  09/13/2008    Laparoscopic assisted converted to open sigmoid colectomy.Hattie Perch 09/13/2008 (05/26/2013)  . Cataract extraction w/ intraocular lens  implant, bilateral Bilateral ~ 2003  . Dilation and curettage of uterus  1950's    "had 2 for miscarriages" (05/26/2013)    MEDICATIONS: Current Outpatient Prescriptions on File Prior to Visit  Medication Sig Dispense Refill  . ALPRAZolam (XANAX) 0.5 MG tablet Take 1 tablet (0.5 mg total) by mouth at bedtime as needed for sleep.  30 tablet  0  . atorvastatin (LIPITOR) 10 MG tablet Take 1 tablet (10 mg total) by mouth daily at 6 PM.  30 tablet  0  . bisoprolol (ZEBETA)  5 MG tablet Take 5 mg by mouth daily at 12 noon.       . Cyanocobalamin (VITAMIN B12 PO) Take 5,000 mcg by mouth daily.      Marland Kitchen levETIRAcetam (KEPPRA) 750 MG tablet Take 1 tablet (750 mg total) by mouth 2 (two) times daily.  60 tablet  0  . loperamide (IMODIUM) 2 MG capsule Take 2 mg by mouth daily as needed for diarrhea or loose stools.       Marland Kitchen neomycin-polymyxin b-dexamethasone (MAXITROL) 3.5-10000-0.1 SUSP Place 1 drop into the left eye every 6 (six) hours.  1 Bottle  0  . predniSONE (DELTASONE) 20 MG tablet Take one tab 2 x daily for 4 days, then 1/2 tab twice daily for 4 days, then 1/2 tab once daily for 4 days  14 tablet  0  . Vitamin D, Ergocalciferol, (DRISDOL) 50000 UNITS CAPS Take 50,000 Units by mouth every Sunday.       No current facility-administered medications on file prior to visit.  Xarelto  ALLERGIES: Allergies  Allergen Reactions  . Ativan [Lorazepam] Other (See Comments)    Severe hallucinations   . Propoxyphene Hcl     Rxn: unknown  . Vancomycin   . Penicillins Rash    FAMILY HISTORY: Family History  Problem Relation Age  of Onset  . Colon cancer Sister     colon  . Hyperlipidemia Mother   . Stroke Mother   . Cancer Father     mesothelioma    SOCIAL HISTORY: History   Social History  . Marital Status: Married    Spouse Name: N/A    Number of Children: 6  . Years of Education: N/A   Occupational History  . retired    Social History Main Topics  . Smoking status: Former Smoker -- 2.50 packs/day for 37 years    Types: Cigarettes    Quit date: 09/05/1984  . Smokeless tobacco: Never Used  . Alcohol Use: No  . Drug Use: No  . Sexual Activity: Not Currently   Other Topics Concern  . Not on file   Social History Narrative  . No narrative on file    REVIEW OF SYSTEMS: Constitutional: No fevers, chills, or sweats, no generalized fatigue, change in appetite Eyes: No visual changes, double vision, eye pain Ear, nose and throat: No hearing loss, ear pain, nasal congestion, sore throat Cardiovascular: No chest pain, palpitations Respiratory:  No shortness of breath at rest or with exertion, wheezes GastrointestinaI: No nausea, vomiting, diarrhea, abdominal pain, fecal incontinence Genitourinary:  No dysuria, urinary retention or frequency Musculoskeletal:  No neck pain, back pain Integumentary: No rash, pruritus, skin lesions Neurological: as above Psychiatric: No depression, insomnia, anxiety Endocrine: No palpitations, fatigue, diaphoresis, mood swings, change in appetite, change in weight, increased thirst Hematologic/Lymphatic:  No anemia, purpura, petechiae. Allergic/Immunologic: no itchy/runny eyes, nasal congestion, recent allergic reactions, rashes  PHYSICAL EXAM: Filed Vitals:   07/10/13 1005  BP: 128/76  Pulse: 68  Temp: 98.1 F (36.7 C)   General: No acute distress Head:  Normocephalic/atraumatic Neck: supple, no paraspinal tenderness, full range of motion Back: No paraspinal tenderness Heart: regular rate and rhythm Lungs: Clear to auscultation bilaterally. Vascular: No  carotid bruits. Neurological Exam: Mental status: alert and oriented to person, place, and time, speech fluent and not dysarthric, language intact. Cranial nerves: CN I: not tested CN II: pupils equal, round and reactive to light, visual fields intact, fundi unremarkable. CN III, IV, VI:  full range of motion,  no nystagmus, no ptosis CN V: facial sensation intact CN VII: upper and lower face symmetric CN VIII: hearing intact CN IX, X: gag intact, uvula midline CN XI: sternocleidomastoid and trapezius muscles intact CN XII: tongue midline Bulk & Tone: normal, no fasciculations. Motor: 5/5 throughout Sensation: temperature intact.  Reduced vibration in the toes.  Deep Tendon Reflexes: 2+ throughout, except absent in ankles.  Toes down Finger to nose testing: postural and kinetic tremor noted bilaterally.  No dysmetria Heel to shin: no dysmetria Gait: wide-based, sometimes veers toward the right. Romberg negative.  IMPRESSION: 1.  Cardio-embolic strokes 2.  Seizure likely secondary to stroke.  I'm not quite sure why the Keppra was increased to 750mg  BID, as it did not seem like she had another seizure. 3.  Rash, likely drug-related.  It can certainly be due to Keppra.  However, I would think that the rash is more likely secondary to antibiotics.  She has a history of drug allergy to antibiotics.  Also, it developed a couple of days after finishing antibiotics, while she had already been on the Keppra for some time. 4.  Visual migraine aura without headache.  PLAN: 1.  Continue Xarelto 2.  We will continue the Keppra 750mg  BID for now.  We will monitor the rash and see if it further improves after finishing the prednisone.  If it worsens, then we will likely have to switch to another anticonvulsant. 3.  I stressed to patient and her family that she must now use the walker at all times (even in the house).  She is a fall-risk and on an anticoagulant, which makes her high-risk for significant  bleed or intracranial bleed, which can be lethal. 4.  Stay hydrated with water, Mediterranean diet, continue physical therapy. 5.  Regarding the visual migraine aura, I would not treat with any new medications at this time. 6.  Follow up in 4 weeks.  45 minute spent with patient and family, over 50% spent counseling and coordinating care.  Thank you for allowing me to take part in the care of this patient.  Shon Millet, DO  CC:  Lisa Capers, MD  Adline Mango, FNP

## 2013-07-11 ENCOUNTER — Ambulatory Visit: Payer: Medicare Other | Admitting: Speech Pathology

## 2013-07-11 ENCOUNTER — Ambulatory Visit: Payer: Medicare Other | Admitting: Physical Therapy

## 2013-07-13 ENCOUNTER — Ambulatory Visit: Payer: Medicare Other | Admitting: Speech Pathology

## 2013-07-13 ENCOUNTER — Ambulatory Visit: Payer: Medicare Other | Admitting: Physical Therapy

## 2013-07-14 ENCOUNTER — Ambulatory Visit (INDEPENDENT_AMBULATORY_CARE_PROVIDER_SITE_OTHER): Payer: Medicare Other | Admitting: Internal Medicine

## 2013-07-14 ENCOUNTER — Encounter: Payer: Self-pay | Admitting: Internal Medicine

## 2013-07-14 VITALS — BP 130/80 | HR 76 | Temp 98.0°F | Resp 18 | Ht 64.5 in | Wt 182.0 lb

## 2013-07-14 DIAGNOSIS — J189 Pneumonia, unspecified organism: Secondary | ICD-10-CM

## 2013-07-14 DIAGNOSIS — I4891 Unspecified atrial fibrillation: Secondary | ICD-10-CM

## 2013-07-14 DIAGNOSIS — R5381 Other malaise: Secondary | ICD-10-CM

## 2013-07-14 NOTE — Progress Notes (Signed)
Subjective:    Patient ID: [name], female    DOB: April 29, 1928, 79 y.o.   MRN: [id number]  HPI   Multiple recent hospitalizations for respiratory issues Stable 4 COPD and pneumonia Patient is concerned about "aspiration" as she had an episode prior to the admission of swallowing issues  The patient is concerned about hydration about diarrhea about a drug rash and had a conversation about safety and fall prevention   Review of Systems  Constitutional: Positive for fatigue and unexpected weight change.  HENT: Positive for facial swelling.   Eyes: Negative.   Respiratory: Positive for cough and shortness of breath.   Cardiovascular: Positive for leg swelling.  Skin: Positive for rash.  Neurological: Positive for tremors and syncope.   Past Medical History  Diagnosis Date  . GERD (gastroesophageal reflux disease)   . History of TIAs   . Diverticulosis   . PUD (peptic ulcer disease)   . PVD (peripheral vascular disease)   . Hyperlipidemia   . Anxiety   . Depression   . Blood transfusion     "w/1st miscarriage" (05/26/2013)  . COPD (chronic obstructive pulmonary disease)   . Hypertension   . PONV (postoperative nausea and vomiting)   . Family history of anesthesia complication     "sisters also get sick as a dog" (05/26/2013)  . Coronary artery disease   . Heart murmur     "when I was a child" (05/26/2013)  . DVT (deep venous thrombosis) 1954    "after I had my first child" (05/26/2013)  . Pneumonia 2013    "once" (05/26/2013)  . Exertional shortness of breath   . H/O hiatal hernia   . Stroke 10/2002    no residual  . Stroke 05/26/2013    "very mild; affected my speech for a while" (05/26/2013)  . Arthritis     "lots; hands" (05/26/2013)  . 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 Lymphona, mantle cell, inguinal region/lower limb     "large c-cell" (05/26/2013)    History   Social History  . Marital  Status: Married    Spouse Name: N/A    Number of Children: 6  . Years of Education: N/A   Occupational History  . retired    Social History Main Topics  . Smoking status: Former Smoker -- 2.50 packs/day for 37 years    Types: Cigarettes    Quit date: 09/05/1984  . Smokeless tobacco: Never Used  . Alcohol Use: No  . Drug Use: No  . Sexual Activity: Not Currently   Other Topics Concern  . Not on file   Social History Narrative  . No narrative on file    Past Surgical History  Procedure Laterality Date  . 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 Tubal ligation  1972  . Radical neck dissection  1986    "larynx cancer" (05/26/2013)  . Total knee arthroplasty Bilateral     2002 left 2008 right  . Ileostomy closure  01/2010  . Cataract extraction  2005    bilateral  . Carotid endarterectomy Bilateral 2004    2004 a month apart right and left  . Tonsillectomy and adenoidectomy  1936    "tonsils grew back; no 2nd OR" (05/26/2013)  . Hernia repair  05/2010    ventral hernia repair  . Carpal tunnel release Left ?1960's  . Abdominal exploration surgery  12/01/2012  .  Appendectomy  09/13/2008  . Colon surgery  09/13/2008    Laparoscopic assisted converted to open sigmoid colectomy.Hattie Perch 09/13/2008 (05/26/2013)  . Cataract extraction w/ intraocular lens  implant, bilateral Bilateral ~ 2003  . Dilation and curettage of uterus  1950's    "had 2 for miscarriages" (05/26/2013)    Family History  Problem Relation Age of Onset  . Colon cancer Sister     colon  . Hyperlipidemia Mother   . Stroke Mother   . Cancer Father     mesothelioma    Allergies  Allergen Reactions  . Ativan [Lorazepam] Other (See Comments)    Severe hallucinations   . Propoxyphene Hcl     Rxn: unknown  . Vancomycin   . Penicillins Rash    Current Outpatient Prescriptions on File Prior to Visit  Medication Sig Dispense Refill  . ALPRAZolam  (XANAX) 0.5 MG tablet Take 1 tablet (0.5 mg total) by mouth at bedtime as needed for sleep.  30 tablet  0  . bisoprolol (ZEBETA) 5 MG tablet Take 5 mg by mouth daily at 12 noon.       . Cyanocobalamin (VITAMIN B12 PO) Take 5,000 mcg by mouth daily.      Marland Kitchen levETIRAcetam (KEPPRA) 750 MG tablet Take 1 tablet (750 mg total) by mouth 2 (two) times daily.  60 tablet  0  . loperamide (IMODIUM) 2 MG capsule Take 2 mg by mouth daily as needed for diarrhea or loose stools.       Marland Kitchen neomycin-polymyxin b-dexamethasone (MAXITROL) 3.5-10000-0.1 SUSP Place 1 drop into the left eye every 6 (six) hours.  1 Bottle  0  . predniSONE (DELTASONE) 20 MG tablet Take one tab 2 x daily for 4 days, then 1/2 tab twice daily for 4 days, then 1/2 tab once daily for 4 days  14 tablet  0  . rivaroxaban (XARELTO) 10 MG TABS tablet Take 10 mg by mouth daily.      . Vitamin D, Ergocalciferol, (DRISDOL) 50000 UNITS CAPS Take 50,000 Units by mouth every Sunday.       No current facility-administered medications on file prior to visit.    There were no vitals taken for this visit.       Objective:   Physical Exam  Constitutional: She is oriented to person, place, and time. She appears well-developed and well-nourished.  HENT:  Head: Atraumatic.  Eyes: Conjunctivae are normal.  Cardiovascular:  Murmur heard. Irregular HR  Pulmonary/Chest: Effort normal and breath sounds normal.  Abdominal: Soft. Bowel sounds are normal.  Neurological: She is alert and oriented to person, place, and time.          Assessment & Plan:  Probiotic  CVA with  No significant residual Resolving diarrhea  Reconditioning goals  Use of the anticoagulant

## 2013-07-14 NOTE — Progress Notes (Signed)
Pre visit review using our clinic review tool, if applicable. No additional management support is needed unless otherwise documented below in the visit note. 

## 2013-07-14 NOTE — Patient Instructions (Signed)
1 we have agreed that we will use a fit bit other monitoring device to be able to build up to about 10,000 steps a day 2. We will use G2 per Smart water as a way of keeping her hydration up 3 we will take a probiotic every day We will use a walker until we reached some of our rehabilitation goals   For the rash 3/4 Eucerin and 1/4 benadryl lotion

## 2013-07-18 ENCOUNTER — Ambulatory Visit: Payer: Medicare Other | Admitting: Speech Pathology

## 2013-07-18 ENCOUNTER — Telehealth: Payer: Self-pay | Admitting: Internal Medicine

## 2013-07-18 ENCOUNTER — Ambulatory Visit: Payer: Medicare Other | Admitting: Physical Therapy

## 2013-07-18 DIAGNOSIS — R569 Unspecified convulsions: Secondary | ICD-10-CM

## 2013-07-18 DIAGNOSIS — J449 Chronic obstructive pulmonary disease, unspecified: Secondary | ICD-10-CM

## 2013-07-18 DIAGNOSIS — I69959 Hemiplegia and hemiparesis following unspecified cerebrovascular disease affecting unspecified side: Secondary | ICD-10-CM

## 2013-07-18 DIAGNOSIS — I10 Essential (primary) hypertension: Secondary | ICD-10-CM

## 2013-07-18 NOTE — Telephone Encounter (Signed)
Heather from [facility] states she needs an order for speech therapy for pt.  She states it can be a verbal order.

## 2013-07-18 NOTE — Telephone Encounter (Signed)
ORDER GIVEN

## 2013-07-19 ENCOUNTER — Telehealth: Payer: Self-pay | Admitting: Internal Medicine

## 2013-07-19 NOTE — Telephone Encounter (Signed)
Pt hus is requesting [facility] [phone] to pick up oxygen from his wife.

## 2013-07-19 NOTE — Telephone Encounter (Signed)
Keep o2 for now. Pt agrees

## 2013-07-20 ENCOUNTER — Telehealth: Payer: Self-pay

## 2013-07-20 ENCOUNTER — Ambulatory Visit: Payer: Medicare Other

## 2013-07-20 ENCOUNTER — Ambulatory Visit: Payer: Medicare Other | Admitting: Physical Therapy

## 2013-07-20 NOTE — Telephone Encounter (Signed)
Would like for PCP to know that pt reported she is having trouble urinating and feels as if her bladder is not empty when she goes. Nurse attempted to obtain urine sample to check for uti, however, pt was unable to give sample before nurse was to leave. Herbert Seta also wanted to advise that pt is being discharged for homehealth as they have reached their goals with her.

## 2013-07-26 ENCOUNTER — Other Ambulatory Visit: Payer: Self-pay | Admitting: *Deleted

## 2013-07-26 ENCOUNTER — Telehealth: Payer: Self-pay | Admitting: Internal Medicine

## 2013-07-26 MED ORDER — RIVAROXABAN 15 MG PO TABS: 15.0000 mg | ORAL_TABLET | Freq: Every day | ORAL | Status: DC

## 2013-07-26 NOTE — Telephone Encounter (Signed)
Sent to pharmacy 

## 2013-07-26 NOTE — Telephone Encounter (Signed)
Pt's husband called to get a refill on pt's rivaroxaban (XARELTO)TABS tablet. Husband states its 15 mg although chart states 10 mg. Pt forgot to get a refill after post hospital  fup  and now is out. Dr Lovell Sheehan out and pt request that padonda might refill for her. Pt saw dr Lovell Sheehan 07/14/13 Pharm: [facility]/ [address]

## 2013-07-31 ENCOUNTER — Other Ambulatory Visit: Payer: Self-pay | Admitting: *Deleted

## 2013-07-31 ENCOUNTER — Telehealth: Payer: Self-pay | Admitting: Internal Medicine

## 2013-07-31 MED ORDER — LEVETIRACETAM 750 MG PO TABS: 750.0000 mg | ORAL_TABLET | Freq: Two times a day (BID) | ORAL | Status: DC

## 2013-07-31 NOTE — Telephone Encounter (Signed)
done

## 2013-07-31 NOTE — Telephone Encounter (Signed)
Pt husband called very concerned because his wife is out of her levETIRAcetam (KEPPRA) 750 MG tablet. He stated that she is now out of the hospital and needs this medication to prevent her from having a stroke. He would like it called in to [facility] on [address] asap. Thank you!

## 2013-08-07 ENCOUNTER — Other Ambulatory Visit: Payer: Self-pay | Admitting: *Deleted

## 2013-08-07 ENCOUNTER — Telehealth: Payer: Self-pay | Admitting: Internal Medicine

## 2013-08-07 DIAGNOSIS — R0602 Shortness of breath: Secondary | ICD-10-CM

## 2013-08-07 NOTE — Telephone Encounter (Signed)
Put order in epic and Left message on machine For melissa to check on it to make sure it was received- ok to discontinue per dr Arnoldo Morale

## 2013-08-07 NOTE — Telephone Encounter (Signed)
Pt husband calling on behalf of patient to request order to d/c oyxgen therapy and have [facility] come out and pick up 02 tanks.  Mr.Asbury also states they will be moving in a few weeks.

## 2013-08-08 ENCOUNTER — Telehealth: Payer: Self-pay | Admitting: Neurology

## 2013-08-08 ENCOUNTER — Ambulatory Visit (INDEPENDENT_AMBULATORY_CARE_PROVIDER_SITE_OTHER): Payer: Medicare Other | Admitting: Neurology

## 2013-08-08 ENCOUNTER — Encounter: Payer: Self-pay | Admitting: Neurology

## 2013-08-08 VITALS — BP 136/62 | HR 92 | Resp 18 | Ht 64.5 in | Wt 183.1 lb

## 2013-08-08 DIAGNOSIS — I6340 Cerebral infarction due to embolism of unspecified cerebral artery: Secondary | ICD-10-CM

## 2013-08-08 DIAGNOSIS — G40209 Localization-related (focal) (partial) symptomatic epilepsy and epileptic syndromes with complex partial seizures, not intractable, without status epilepticus: Secondary | ICD-10-CM

## 2013-08-08 DIAGNOSIS — G43809 Other migraine, not intractable, without status migrainosus: Secondary | ICD-10-CM

## 2013-08-08 DIAGNOSIS — G43109 Migraine with aura, not intractable, without status migrainosus: Secondary | ICD-10-CM

## 2013-08-08 DIAGNOSIS — I639 Cerebral infarction, unspecified: Secondary | ICD-10-CM

## 2013-08-08 NOTE — Telephone Encounter (Signed)
Received a vm message from the patient's husband stating, "We saw Dr. Tomi Likens this morning and there is no need for PT at this time." Thank you.

## 2013-08-08 NOTE — Progress Notes (Signed)
NEUROLOGY FOLLOW UP OFFICE NOTE  [name] [id number]  HISTORY OF PRESENT ILLNESS: [name] is an 80 year old right-handed woman with history of hypertension, atrial fibrillation, peripheral vascular disease, anxiety, depression, B Cell lymphoma status post chemotherapy, and COPD who follows up for cardio-embolic stroke and seizure secondary to stroke.  She is accompanied by her husband and her daughter.  Records and images were personally reviewed where available.    UPDATE: Since last month, she has finished the prednisone taper and she continues on Keppra.  The rash has improved with very little residual effect in one small area of chest.  [name] she experienced before on Keppra has also improved.  No seizures.  She still has not been using the walker consistently.  She has been undergoing home PT but feels she needs a little more to help with balance.  She had one episode of ocular migraine since last visit, lasting 15 minutes or so.  Inquiring about being able to drive again.  Her daughter is concerned because she feels that when she was driving prior to hospitalizations, she was slow to respond.  HISTORY: She presented to the ED on 05/24/13 with confusion.  She had a negative head CT and was discharged.  She returned to the ED on 05/26/13 with aphasia.  An MRI of the brain was performed, which revealed an acute lacunar infarct in the left anterior corona radiata and lentiform nucleus.  CTA of the head revealed atherosclerotic changes in the cavernous carotid arteries without significant stenosis.  CTA of neck revealed focal 50% stenosis of mid right ICA.  2D Echo revealed 50-55% EF with no source of embolus.  Hgb A1c 5.9, LDL 97.  She was switched from [name] to Eliquis.  She was readmitted to the hospital on 05/31/13 with recurrent aphasia.  She had trouble getting her words out and then couldn't speak at all.  Repeat MRI of brain revealed new punctate infarct in the  left posterior corona radiata, the same location as previous infarct. No changes in management were made.  She was readmitted to the hospital on 06/07/13 for altered mental status.  During the night, her husband found her standing naked in the closet putting on a summer dress with a sweater hanging out of one arm.  She was amnestic to this event.  When she woke up early in the morning, she exhibited again expressive aphasia, gait imbalance and nausea.  MRI of brain revealed pseudonormalization on DWI of the more acute punctate infarct involving the posterior left corona radiata as well as typical evolution of the older subacute infarct involving the left lentiform nucleus and anterior corona radiata.  EEG was performed, which revealed intermittent infrequent left fronto-parieto-temporal theta slowing.  Due to possibility of seizure, she was started on Keppra 250mg  BID.  No change made to Eliquis.  She presented to the ED on 06/16/13 after waking up in the morning with expressive aphasia and right-sided weakness.  Symptoms improved in the ED.  CT head revealed prior lacunar infarct in left lentiform nucleus and corona radiata, but no new abnormalities.  It was suspected that she may have had another seizure and her Keppra was increased to 500mg  BID.  She was admitted to the hospital again on 06/21/13 again for expressive aphasia.  MRI brain revealed 2 new punctate infarcts in the bilateral hemispheres at level of centrum semiovale.  MRA of head was stable.  Carotid dopplers revealed right 1-39% ICA stenosis and left  20-49% ICA stenosis.  2D echo stable.  Eliquis was changed to Xarelto. Keppra was increased to 750mg  BID.  A statin was added, as well.  She developed lethargy, so her Cymbalta and statin was discontinued and she felt better.  She was readmitted to the hospital on 06/26/13 for generalized weakness and shortness of breath.  She was found to have pneumonia and started on antibiotics.  A couple of  days after finishing the antibiotics, she developed a significant maculopapular rash on her neck and chest.  She was started on prednisone, and it is slowly improving.  Previously, she had an erythemic rash across her forehead, thought to be secondary to Vancomycin.  Throughout all her hospital stays, she has been rate-controlled.    Due to stroke and pneumonia, she was undergone deconditioning and currently will be having physical therapy.  She has a walker but doesn't use it all the time.  She does not have a walker now.  She has history of gait problems after knee surgery, but she has been more unsteady since her prolonged hospital admissions.  Of note, she has longstanding history of migraines, described as scotomas, fortification spectrum and flashing lights in the right eye.  There is no associated headache.  PAST MEDICAL HISTORY: Past Medical History  Diagnosis Date  . GERD (gastroesophageal reflux disease)   . History of TIAs   . Diverticulosis   . PUD (peptic ulcer disease)   . PVD (peripheral vascular disease)   . Hyperlipidemia   . Anxiety   . Depression   . Blood transfusion     "w/1st miscarriage" (05/26/2013)  . COPD (chronic obstructive pulmonary disease)   . Hypertension   . PONV (postoperative nausea and vomiting)   . Family history of anesthesia complication     "sisters also get sick as a dog" (05/26/2013)  . Coronary artery disease   . Heart murmur     "when I was a child" (05/26/2013)  . DVT (deep venous thrombosis) 1954    "after I had my first child" (05/26/2013)  . Pneumonia 2013    "once" (05/26/2013)  . Exertional shortness of breath   . H/O hiatal hernia   . Stroke 10/2002    no residual  . Stroke 05/26/2013    "very mild; affected my speech for a while" (05/26/2013)  . Arthritis     "lots; hands" (05/26/2013)  . 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 Lymphona, mantle cell, inguinal region/lower limb       "large c-cell" (05/26/2013)    MEDICATIONS: Current Outpatient Prescriptions on File Prior to Visit  Medication Sig Dispense Refill  . ALPRAZolam (XANAX) 0.5 MG tablet Take 1 tablet (0.5 mg total) by mouth at bedtime as needed for sleep.  30 tablet  0  . bisoprolol (ZEBETA) 5 MG tablet Take 5 mg by mouth daily at 12 noon.       . levETIRAcetam (KEPPRA) 750 MG tablet Take 1 tablet (750 mg total) by mouth 2 (two) times daily.  60 tablet  3  . loperamide (IMODIUM) 2 MG capsule Take 2 mg by mouth daily as needed for diarrhea or loose stools.       Marland Kitchen neomycin-polymyxin b-dexamethasone (MAXITROL) 3.5-10000-0.1 SUSP Place 1 drop into the left eye every 6 (six) hours.  1 Bottle  0  . Rivaroxaban (XARELTO) 15 MG TABS tablet Take 1 tablet (15 mg total) by mouth daily.  30 tablet  2  . Vitamin D, Ergocalciferol, (DRISDOL) 50000 UNITS CAPS Take 50,000 Units by mouth every Sunday.       No current facility-administered medications on file prior to visit.    ALLERGIES: Allergies  Allergen Reactions  . Ativan [Lorazepam] Other (See Comments)    Severe hallucinations   . Propoxyphene Hcl     Rxn: unknown  . Vancomycin   . Penicillins Rash    FAMILY HISTORY: Family History  Problem Relation Age of Onset  . Colon cancer Sister     colon  . Hyperlipidemia Mother   . Stroke Mother   . Cancer Father     mesothelioma    SOCIAL HISTORY: History   Social History  . Marital Status: Married    Spouse Name: N/A    Number of Children: 6  . Years of Education: N/A   Occupational History  . retired    Social History Main Topics  . Smoking status: Former Smoker -- 2.50 packs/day for 37 years    Types: Cigarettes    Quit date: 09/05/1984  . Smokeless tobacco: Never Used  . Alcohol Use: No  . Drug Use: No  . Sexual Activity: Not Currently   Other Topics Concern  . Not on file   Social History Narrative  . No narrative on file    REVIEW OF SYSTEMS: Constitutional: No fevers,  chills, or sweats, no generalized fatigue, change in appetite Eyes: No visual changes, double vision, eye pain Ear, nose and throat: No hearing loss, ear pain, nasal congestion, sore throat Cardiovascular: No chest pain, palpitations Respiratory:  No shortness of breath at rest or with exertion, wheezes GastrointestinaI: No nausea, vomiting, diarrhea, abdominal pain, fecal incontinence Genitourinary:  No dysuria, urinary retention or frequency Musculoskeletal:  No neck pain, back pain Integumentary: No rash, pruritus, skin lesions Neurological: as above Psychiatric: No depression, insomnia, anxiety Endocrine: No palpitations, fatigue, diaphoresis, mood swings, change in appetite, change in weight, increased thirst Hematologic/Lymphatic:  No anemia, purpura, petechiae. Allergic/Immunologic: no itchy/runny eyes, nasal congestion, recent allergic reactions, rashes  PHYSICAL EXAM: Filed Vitals:   08/08/13 0834  BP: 136/62  Pulse: 92  Resp: 18   General: No acute distress Head:  Normocephalic/atraumatic Neck: supple, no paraspinal tenderness, full range of motion Heart:  Regular rate and rhythm Lungs:  Clear to auscultation bilaterally Back: No paraspinal tenderness Neurological Exam: wide-based gait  1. Cardio-embolic strokes  2. Seizure likely secondary to stroke. I'm not quite sure why the Keppra was increased to 750mg  BID, as it did not seem like she had another seizure.  3. Visual migraine aura without headache.  PLAN:  1. Continue Xarelto  2. Keppra 750mg  BID 3. I stressed to patient and her family that she must now use the walker at all times (even in the house). She is a fall-risk and on an anticoagulant, which makes her high-risk for significant bleed or intracranial bleed, which can be lethal.  4. Stay hydrated with water, Mediterranean diet, continue physical therapy.  5. Regarding the visual migraine aura, I would not treat with any new medications at this time.  6. No  driving 7.  Follow up in 5 months, at end of May or early June.  30 minutes spent with patient, 99% spent counseling and coordinating care.   Lisa Clines, DO  CC:  Lisa Pillow, MD

## 2013-08-08 NOTE — Patient Instructions (Signed)
1.  Continue Keppra 2.  Will refer for more physical therapy. 3.  MUST USE WALKER AT ALL TIMES 4.  No driving 5.  Follow up at end of May.

## 2013-08-10 ENCOUNTER — Ambulatory Visit (HOSPITAL_BASED_OUTPATIENT_CLINIC_OR_DEPARTMENT_OTHER): Payer: Medicare Other | Admitting: Nurse Practitioner

## 2013-08-10 ENCOUNTER — Ambulatory Visit: Payer: Self-pay | Admitting: Neurology

## 2013-08-10 VITALS — BP 107/69 | HR 85 | Temp 96.9°F | Resp 19 | Ht 64.5 in | Wt 183.2 lb

## 2013-08-10 DIAGNOSIS — Z8673 Personal history of transient ischemic attack (TIA), and cerebral infarction without residual deficits: Secondary | ICD-10-CM

## 2013-08-10 DIAGNOSIS — C8588 Other specified types of non-Hodgkin lymphoma, lymph nodes of multiple sites: Secondary | ICD-10-CM

## 2013-08-10 DIAGNOSIS — C8510 Unspecified B-cell lymphoma, unspecified site: Secondary | ICD-10-CM

## 2013-08-10 DIAGNOSIS — I4891 Unspecified atrial fibrillation: Secondary | ICD-10-CM

## 2013-08-10 NOTE — Progress Notes (Signed)
OFFICE PROGRESS NOTE  Interval history:  [name] returns for scheduled followup of non-Hodgkin's lymphoma. She and her family report multiple recent admissions for strokes related to atrial fibrillation, seizures and most recently pneumonia.  She denies fevers or sweats. Appetite varies. She feels this is medication related. She denies weight loss. No pain. She has not noticed any enlarged lymph nodes. She chronically has loose stools. She reports intermittent partial expressive aphasia. Her family reports she will be beginning speech therapy and physical therapy in the near future. She is utilizing a walker.   Objective: Filed Vitals:   08/10/13 0946  BP: 107/69  Pulse: 85  Temp: 96.9 F (36.1 C)  Resp: 19   Oropharynx is without thrush or ulceration. No palpable cervical, supraclavicular, axillary or inguinal lymph nodes. Rales left lung base. Lungs otherwise clear. Regular cardiac rhythm. Abdomen soft and nontender. No organomegaly. No leg edema.   Lab Results: Lab Results  Component Value Date   WBC 6.1 06/29/2013   HGB 14.3 06/29/2013   HCT 43.6 06/29/2013   MCV 88.4 06/29/2013   PLT 189 06/29/2013   NEUTROABS 1.5* 06/26/2013    Chemistry:    Chemistry      Component Value Date/Time   NA 141 06/30/2013 0455   NA 139 07/06/2012 0949   K 3.5 06/30/2013 0455   K 4.4 07/06/2012 0949   CL 103 06/30/2013 0455   CL 105 07/06/2012 0949   CO2 26 06/30/2013 0455   CO2 21* 07/06/2012 0949   BUN 22 06/30/2013 0455   BUN 11.0 07/06/2012 0949   CREATININE 1.46* 06/30/2013 0455   CREATININE 1.0 07/06/2012 0949      Component Value Date/Time   CALCIUM 9.8 06/30/2013 0455   CALCIUM 9.3 07/06/2012 0949   ALKPHOS 75 06/21/2013 1305   ALKPHOS 74 07/06/2012 0949   AST 30 06/21/2013 1305   AST 26 07/06/2012 0949   ALT 25 06/21/2013 1305   ALT 24 07/06/2012 0949   BILITOT 0.6 06/21/2013 1305   BILITOT 0.51 07/06/2012 0949       Studies/Results: No results found.  Medications:  I have reviewed the patient's current medications.  Assessment/Plan: 1. Non-Hodgkin's lymphoma, large B-cell lymphoma, CD20 positive involving 8 needle core biopsy of a right iliac lymph node 02/12/2012. CT of the abdomen and pelvis on 01/15/2012 showed abdominal/pelvic lymphadenopathy. Serum LDH in normal range on 02/25/2012. Staging PET scan 02/25/2012 with a small lymph node posteriolateral to the oropharynx on the left measuring 9 x 7 mm with SUV 5.4; large cluster of hypermetabolic lymph nodes in the right hilum with SUV 4.3; cluster of mildly enlarged hepatoduodenal ligament lymph nodes and mildly enlarged portacaval lymph node with hypermetabolic activity (SUV max 37.1); numerous borderline enlarged and mildly enlarged periaortic retroperitoneal lymph nodes with extensive hypermetabolic activity (SUV max 43.6-47.6); numerous borderline enlarged and mildly enlarged hypermetabolic lymph nodes along the pelvic sidewall bilaterally most pronounced on the right with a right external iliac lymph node measuring 2.9 x 4.1 cm (SUV max 52.5). Bone marrow aspiration/biopsy 03/09/2012 negative for involvement with lymphoma. Status post cycle 1 of CHOP-Rituxan on 03/15/2012. She completed cycle 3 on 05/04/2012 and cycle 4 on 05/25/2012. Restaging PET scan on 06/10/2012 was markedly improved with resolution of the thoracic and retroperitoneal adenopathy and near complete resolution of porta hepatis adenopathy. She completed cycle 6 of CHOP/Rituxan on 07/06/2012. 2. History of night sweats likely secondary to #1. 3. Rectal bleeding June 2013 status post negative colonoscopy 01/28/2012 and upper  endoscopy 02/11/2012. The bleeding was likely related to hemorrhoids. 4. COPD. 5. Remote history of larynx cancer. 6. Status post Port-A-Cath placement 03/09/2012. Removed 09/13/2012. 7. 2-D echo 03/11/2012 with left ventricular ejection fraction 60-65%. 8. Admission with febrile neutropenia 03/22/2012 with no source for  infection identified 9. Admission with fever and a chest x-ray consistent with "pneumonia"on 03/27/2012. She completed a course of Levaquin. 10. Pancytopenia following cycle 1 of CHOP-rituximab-resolved. 11. History of atrial fibrillation-maintained on Pradaxa 12. Numbness/tingling in the fingertips and toes. The numbness in the toes predated the start of chemotherapy.. She also reports the tingling in the fingertips was present prior to the start of chemotherapy. The vincristine was dose reduced with cycle 2. Vincristine was discontinued beginning with cycle 3. 13. Cardioembolic strokes, seizures. She is followed by neurology. 7. Hospitalization with pneumonia November 2014.   Dispositon-she remains in clinical remission from non-Hodgkin's lymphoma. She will return for a followup visit in 6 months. She will contact the office in the interim with any problems.  Plan reviewed with Dr. Benay Spice.   [name] ANP/GNP-BC

## 2013-08-11 ENCOUNTER — Telehealth: Payer: Self-pay | Admitting: Oncology

## 2013-08-11 NOTE — Telephone Encounter (Signed)
s/w pt re appt for 7/16 @ 3:15pm.

## 2013-08-21 ENCOUNTER — Other Ambulatory Visit: Payer: Self-pay | Admitting: Family

## 2013-08-21 ENCOUNTER — Ambulatory Visit (INDEPENDENT_AMBULATORY_CARE_PROVIDER_SITE_OTHER): Payer: Medicare Other | Admitting: Family

## 2013-08-21 ENCOUNTER — Encounter: Payer: Self-pay | Admitting: Family

## 2013-08-21 VITALS — BP 118/70 | HR 64 | Wt 185.0 lb

## 2013-08-21 DIAGNOSIS — R319 Hematuria, unspecified: Secondary | ICD-10-CM

## 2013-08-21 DIAGNOSIS — R350 Frequency of micturition: Secondary | ICD-10-CM

## 2013-08-21 LAB — POCT URINALYSIS DIPSTICK
GLUCOSE UA: NEGATIVE
Leukocytes, UA: NEGATIVE
NITRITE UA: NEGATIVE
SPEC GRAV UA: 1.02
UROBILINOGEN UA: 1
pH, UA: 6

## 2013-08-21 MED ORDER — SULFAMETHOXAZOLE-TRIMETHOPRIM 800-160 MG PO TABS: 1.0000 | ORAL_TABLET | Freq: Two times a day (BID) | ORAL | Status: AC

## 2013-08-21 NOTE — Progress Notes (Signed)
Subjective:    Patient ID: [name], female    DOB: [birth year], 80 y.o.   MRN: [id number]  HPI  80 year old caucasian female, nonsmoker presenting with blood in urin x 5 days.  Denies urinary frequency and urgency, burning on urination, or itching.  She has occasional cramps in her stomach.  Has not used any over the counter medications.  She has drank cranberry juice over the last few days.    Review of Systems  Constitutional: Negative.   Respiratory: Negative.   Cardiovascular: Negative.   Gastrointestinal: Positive for diarrhea.       Diarrhea  Endocrine: Negative.   Genitourinary: Positive for hematuria.       Hematuria  Musculoskeletal: Negative.   Skin: Negative.   Allergic/Immunologic: Negative.   Neurological: Negative.   Psychiatric/Behavioral: Negative.    Past Medical History  Diagnosis Date  . GERD (gastroesophageal reflux disease)   . History of TIAs   . Diverticulosis   . PUD (peptic ulcer disease)   . PVD (peripheral vascular disease)   . Hyperlipidemia   . Anxiety   . Depression   . Blood transfusion     "w/1st miscarriage" (05/26/2013)  . COPD (chronic obstructive pulmonary disease)   . Hypertension   . PONV (postoperative nausea and vomiting)   . Family history of anesthesia complication     "sisters also get sick as a dog" (05/26/2013)  . Coronary artery disease   . Heart murmur     "when I was a child" (05/26/2013)  . DVT (deep venous thrombosis) 1954    "after I had my first child" (05/26/2013)  . Pneumonia 2013    "once" (05/26/2013)  . Exertional shortness of breath   . H/O hiatal hernia   . Stroke 10/2002    no residual  . Stroke 05/26/2013    "very mild; affected my speech for a while" (05/26/2013)  . Arthritis     "lots; hands" (05/26/2013)  . 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 Lymphona, mantle cell, inguinal region/lower limb     "large c-cell" (05/26/2013)     History   Social History  . Marital Status: Married    Spouse Name: N/A    Number of Children: 6  . Years of Education: N/A   Occupational History  . retired    Social History Main Topics  . Smoking status: Former Smoker -- 2.50 packs/day for 37 years    Types: Cigarettes    Quit date: 09/05/1984  . Smokeless tobacco: Never Used  . Alcohol Use: No  . Drug Use: No  . Sexual Activity: Not Currently   Other Topics Concern  . Not on file   Social History Narrative  . No narrative on file    Past Surgical History  Procedure Laterality Date  . 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 Tubal ligation  1972  . Radical neck dissection  1986    "larynx cancer" (05/26/2013)  . Total knee arthroplasty Bilateral     2002 left 2008 right  . Ileostomy closure  01/2010  . Cataract extraction  2005    bilateral  . Carotid endarterectomy Bilateral 2004    2004 a month apart right and left  . Tonsillectomy and adenoidectomy  1936    "tonsils grew back; no 2nd OR" (05/26/2013)  . Hernia repair  05/2010    ventral hernia  repair  . Carpal tunnel release Left ?1960's  . Abdominal exploration surgery  12/01/2012  . Appendectomy  09/13/2008  . Colon surgery  09/13/2008    Laparoscopic assisted converted to open sigmoid colectomy.Archie Endo 09/13/2008 (05/26/2013)  . Cataract extraction w/ intraocular lens  implant, bilateral Bilateral ~ 2003  . Dilation and curettage of uterus  1950's    "had 2 for miscarriages" (05/26/2013)    Family History  Problem Relation Age of Onset  . Colon cancer Sister     colon  . Hyperlipidemia Mother   . Stroke Mother   . Cancer Father     mesothelioma    Allergies  Allergen Reactions  . Ativan [Lorazepam] Other (See Comments)    Severe hallucinations   . Propoxyphene Hcl     Rxn: unknown  . Vancomycin   . Penicillins Rash    Current Outpatient Prescriptions on File Prior to Visit   Medication Sig Dispense Refill  . ALPRAZolam (XANAX) 0.5 MG tablet Take 1 tablet (0.5 mg total) by mouth at bedtime as needed for sleep.  30 tablet  0  . bisoprolol (ZEBETA) 5 MG tablet Take 5 mg by mouth daily at 12 noon.       . levETIRAcetam (KEPPRA) 750 MG tablet Take 1 tablet (750 mg total) by mouth 2 (two) times daily.  60 tablet  3  . loperamide (IMODIUM) 2 MG capsule Take 2 mg by mouth daily as needed for diarrhea or loose stools.       Marland Kitchen neomycin-polymyxin b-dexamethasone (MAXITROL) 3.5-10000-0.1 SUSP Place 1 drop into the left eye every 6 (six) hours.  1 Bottle  0  . Rivaroxaban (XARELTO) 15 MG TABS tablet Take 1 tablet (15 mg total) by mouth daily.  30 tablet  2  . Vitamin D, Ergocalciferol, (DRISDOL) 50000 UNITS CAPS Take 50,000 Units by mouth every Sunday.       No current facility-administered medications on file prior to visit.    BP 118/70  Pulse 64  Wt 185 lb (83.915 kg)chart    Objective:   Physical Exam  Constitutional: She is oriented to person, place, and time. She appears well-developed and well-nourished.  HENT:  Head: Normocephalic.  Cardiovascular: Normal rate and regular rhythm.   Pulmonary/Chest: Effort normal and breath sounds normal.  Abdominal: Soft. Bowel sounds are normal. There is no tenderness. There is no rebound and no guarding.  Genitourinary:  Hematuria and diarrhea noted  Neurological: She is alert and oriented to person, place, and time.  Skin: Skin is warm and dry.  Psychiatric: She has a normal mood and affect.          Assessment & Plan:  Assessment 1. Hematuria 2. Diarrhea  Plan 1. Do not take Xarelto tonight and tomorrow. 2.Patient decided to take urine cup home for specimen.  3. Obtain C & S on urine specimen.

## 2013-08-23 LAB — URINE CULTURE: Colony Count: 85000

## 2013-09-11 ENCOUNTER — Ambulatory Visit (INDEPENDENT_AMBULATORY_CARE_PROVIDER_SITE_OTHER): Payer: Medicare Other | Admitting: Internal Medicine

## 2013-09-11 ENCOUNTER — Encounter: Payer: Self-pay | Admitting: Internal Medicine

## 2013-09-11 VITALS — BP 146/74 | HR 80 | Temp 97.4°F | Resp 18 | Ht 64.5 in | Wt 184.0 lb

## 2013-09-11 DIAGNOSIS — R5383 Other fatigue: Secondary | ICD-10-CM

## 2013-09-11 DIAGNOSIS — I10 Essential (primary) hypertension: Secondary | ICD-10-CM

## 2013-09-11 DIAGNOSIS — R531 Weakness: Secondary | ICD-10-CM

## 2013-09-11 DIAGNOSIS — R5381 Other malaise: Secondary | ICD-10-CM

## 2013-09-11 DIAGNOSIS — R197 Diarrhea, unspecified: Secondary | ICD-10-CM

## 2013-09-11 DIAGNOSIS — I4891 Unspecified atrial fibrillation: Secondary | ICD-10-CM

## 2013-09-11 LAB — COMPREHENSIVE METABOLIC PANEL
ALK PHOS: 68 U/L (ref 39–117)
ALT: 20 U/L (ref 0–35)
AST: 19 U/L (ref 0–37)
Albumin: 4 g/dL (ref 3.5–5.2)
BILIRUBIN TOTAL: 0.7 mg/dL (ref 0.3–1.2)
BUN: 17 mg/dL (ref 6–23)
CO2: 26 mEq/L (ref 19–32)
Calcium: 9.9 mg/dL (ref 8.4–10.5)
Chloride: 103 mEq/L (ref 96–112)
Creatinine, Ser: 1 mg/dL (ref 0.4–1.2)
GFR: 54.32 mL/min — ABNORMAL LOW (ref >60.00)
GLUCOSE: 81 mg/dL (ref 70–99)
POTASSIUM: 5 meq/L (ref 3.5–5.1)
Sodium: 140 mEq/L (ref 135–145)
Total Protein: 7.5 g/dL (ref 6.0–8.3)

## 2013-09-11 MED ORDER — COLESTIPOL HCL 5 G PO PACK: 5.0000 g | PACK | Freq: Two times a day (BID) | ORAL | Status: DC

## 2013-09-11 MED ORDER — ALPRAZOLAM 0.5 MG PO TABS: 0.5000 mg | ORAL_TABLET | Freq: Every evening | ORAL | Status: DC | PRN

## 2013-09-11 NOTE — Patient Instructions (Signed)
mucinex DM  Is OK to take at night

## 2013-09-11 NOTE — Progress Notes (Signed)
Subjective:    Patient ID: [name], female    DOB: [birth year], 80 y.o.   MRN: [id number]  Hypertension Associated symptoms include headaches, palpitations and shortness of breath.  Atrial Fibrillation Symptoms include dizziness, hypertension, palpitations, shortness of breath and weakness. Past medical history includes atrial fibrillation.   Has between 3-4 loose stools a day ( accidents) Has cramping and immediate need. Increased SOB, cough, and chronic hoarseness. Increased mucous and cough. Worse at night Hx of AF   Has some "normal" BMs but most are loose Blood pressure is good    Review of Systems  Eyes: Negative.   Respiratory: Positive for cough and shortness of breath.   Cardiovascular: Positive for palpitations.  Genitourinary: Positive for frequency.  Neurological: Positive for dizziness, tremors, weakness, light-headedness and headaches.   Past Medical History  Diagnosis Date  . GERD (gastroesophageal reflux disease)   . History of TIAs   . Diverticulosis   . PUD (peptic ulcer disease)   . PVD (peripheral vascular disease)   . Hyperlipidemia   . Anxiety   . Depression   . Blood transfusion     "w/1st miscarriage" (05/26/2013)  . COPD (chronic obstructive pulmonary disease)   . Hypertension   . PONV (postoperative nausea and vomiting)   . Family history of anesthesia complication     "sisters also get sick as a dog" (05/26/2013)  . Coronary artery disease   . Heart murmur     "when I was a child" (05/26/2013)  . DVT (deep venous thrombosis) 1954    "after I had my first child" (05/26/2013)  . Pneumonia 2013    "once" (05/26/2013)  . Exertional shortness of breath   . H/O hiatal hernia   . Stroke 10/2002    no residual  . Stroke 05/26/2013    "very mild; affected my speech for a while" (05/26/2013)  . Arthritis     "lots; hands" (05/26/2013)  . 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  Lymphona, mantle cell, inguinal region/lower limb     "large c-cell" (05/26/2013)    History   Social History  . Marital Status: Married    Spouse Name: N/A    Number of Children: 6  . Years of Education: N/A   Occupational History  . retired    Social History Main Topics  . Smoking status: Former Smoker -- 2.50 packs/day for 37 years    Types: Cigarettes    Quit date: 09/05/1984  . Smokeless tobacco: Never Used  . Alcohol Use: No  . Drug Use: No  . Sexual Activity: Not Currently   Other Topics Concern  . Not on file   Social History Narrative  . No narrative on file    Past Surgical History  Procedure Laterality Date  . 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 Tubal ligation  1972  . Radical neck dissection  1986    "larynx cancer" (05/26/2013)  . Total knee arthroplasty Bilateral     2002 left 2008 right  . Ileostomy closure  01/2010  . Cataract extraction  2005    bilateral  . Carotid endarterectomy Bilateral 2004    2004 a month apart right and left  . Tonsillectomy and adenoidectomy  1936    "tonsils grew back; no 2nd OR" (05/26/2013)  . Hernia repair  05/2010    ventral hernia repair  . Carpal tunnel release  Left ?1960's  . Abdominal exploration surgery  12/01/2012  . Appendectomy  09/13/2008  . Colon surgery  09/13/2008    Laparoscopic assisted converted to open sigmoid colectomy.Archie Endo 09/13/2008 (05/26/2013)  . Cataract extraction w/ intraocular lens  implant, bilateral Bilateral ~ 2003  . Dilation and curettage of uterus  1950's    "had 2 for miscarriages" (05/26/2013)    Family History  Problem Relation Age of Onset  . Colon cancer Sister     colon  . Hyperlipidemia Mother   . Stroke Mother   . Cancer Father     mesothelioma    Allergies  Allergen Reactions  . Ativan [Lorazepam] Other (See Comments)    Severe hallucinations   . Propoxyphene Hcl     Rxn: unknown  . Vancomycin   .  Penicillins Rash    Current Outpatient Prescriptions on File Prior to Visit  Medication Sig Dispense Refill  . bisoprolol (ZEBETA) 5 MG tablet Take 5 mg by mouth daily at 12 noon.       . levETIRAcetam (KEPPRA) 750 MG tablet Take 1 tablet (750 mg total) by mouth 2 (two) times daily.  60 tablet  3  . loperamide (IMODIUM) 2 MG capsule Take 2 mg by mouth daily as needed for diarrhea or loose stools.       . Rivaroxaban (XARELTO) 15 MG TABS tablet Take 1 tablet (15 mg total) by mouth daily.  30 tablet  2  . Vitamin D, Ergocalciferol, (DRISDOL) 50000 UNITS CAPS Take 50,000 Units by mouth every Sunday.       No current facility-administered medications on file prior to visit.    BP 146/74  Pulse 80  Temp(Src) 97.4 F (36.3 C)  Resp 18  Ht 5' 4.5" (1.638 m)  Wt 184 lb (83.462 kg)  BMI 31.11 kg/m2       Objective:   Physical Exam  Nursing note and vitals reviewed. Constitutional: She is oriented to person, place, and time. She appears well-developed and well-nourished.  HENT:  Head: Normocephalic and atraumatic.  Cardiovascular: Normal rate and regular rhythm.   Murmur heard. Pulmonary/Chest: Breath sounds normal.  Abdominal: Soft. Bowel sounds are normal.  Musculoskeletal: She exhibits edema and tenderness.  Neurological: She is oriented to person, place, and time.  Skin: Skin is warm and dry.          Assessment & Plan:  Chronic intermittent diarrhea: Trial of colestid Monitor electrolytes to make sure the potassium is not low with the chronic loose stools. Stay hydrated

## 2013-09-11 NOTE — Progress Notes (Signed)
Pre visit review using our clinic review tool, if applicable. No additional management support is needed unless otherwise documented below in the visit note. 

## 2013-09-12 ENCOUNTER — Telehealth: Payer: Self-pay | Admitting: Internal Medicine

## 2013-09-12 NOTE — Telephone Encounter (Signed)
Relevant patient education assigned to patient using Emmi. ° °

## 2013-09-21 ENCOUNTER — Encounter: Payer: Self-pay | Admitting: Family

## 2013-09-21 ENCOUNTER — Ambulatory Visit (INDEPENDENT_AMBULATORY_CARE_PROVIDER_SITE_OTHER): Payer: Medicare Other | Admitting: Family

## 2013-09-21 VITALS — BP 120/76 | HR 76 | Ht 64.5 in | Wt 182.0 lb

## 2013-09-21 DIAGNOSIS — R197 Diarrhea, unspecified: Secondary | ICD-10-CM

## 2013-09-21 DIAGNOSIS — K5732 Diverticulitis of large intestine without perforation or abscess without bleeding: Secondary | ICD-10-CM

## 2013-09-21 LAB — CBC WITH DIFFERENTIAL/PLATELET
BASOS ABS: 0 10*3/uL (ref 0.0–0.1)
Basophils Relative: 0.4 % (ref 0.0–3.0)
Eosinophils Absolute: 0.2 10*3/uL (ref 0.0–0.7)
Eosinophils Relative: 3.6 % (ref 0.0–5.0)
HEMATOCRIT: 39.3 % (ref 36.0–46.0)
HEMOGLOBIN: 12.8 g/dL (ref 12.0–15.0)
LYMPHS ABS: 1.4 10*3/uL (ref 0.7–4.0)
Lymphocytes Relative: 22.3 % (ref 12.0–46.0)
MCHC: 32.4 g/dL (ref 30.0–36.0)
MCV: 87.9 fl (ref 78.0–100.0)
MONO ABS: 0.7 10*3/uL (ref 0.1–1.0)
Monocytes Relative: 10.6 % (ref 3.0–12.0)
Neutro Abs: 3.9 10*3/uL (ref 1.4–7.7)
Neutrophils Relative %: 63.1 % (ref 43.0–77.0)
PLATELETS: 230 10*3/uL (ref 150.0–400.0)
RBC: 4.47 Mil/uL (ref 3.87–5.11)
RDW: 14.6 % (ref 11.5–14.6)
WBC: 6.2 10*3/uL (ref 4.5–10.5)

## 2013-09-21 LAB — BASIC METABOLIC PANEL
BUN: 16 mg/dL (ref 6–23)
CHLORIDE: 103 meq/L (ref 96–112)
CO2: 27 meq/L (ref 19–32)
Calcium: 10.1 mg/dL (ref 8.4–10.5)
Creatinine, Ser: 1 mg/dL (ref 0.4–1.2)
GFR: 58.92 mL/min — ABNORMAL LOW (ref >60.00)
GLUCOSE: 85 mg/dL (ref 70–99)
POTASSIUM: 5.2 meq/L — AB (ref 3.5–5.1)
SODIUM: 139 meq/L (ref 135–145)

## 2013-09-21 MED ORDER — MOXIFLOXACIN HCL 400 MG PO TABS: 400.0000 mg | ORAL_TABLET | Freq: Every day | ORAL | Status: DC

## 2013-09-21 NOTE — Progress Notes (Signed)
Pre visit review using our clinic review tool, if applicable. No additional management support is needed unless otherwise documented below in the visit note. 

## 2013-09-21 NOTE — Patient Instructions (Signed)
Diverticulitis °A diverticulum is a small pouch or sac on the colon. Diverticulosis is the presence of these diverticula on the colon. Diverticulitis is the irritation (inflammation) or infection of diverticula. °CAUSES  °The colon and its diverticula contain bacteria. If food particles block the tiny opening to a diverticulum, the bacteria inside can grow and cause an increase in pressure. This leads to infection and inflammation and is called diverticulitis. °SYMPTOMS  °· Abdominal pain and tenderness. Usually, the pain is located on the left side of your abdomen. However, it could be located elsewhere. °· Fever. °· Bloating. °· Feeling sick to your stomach (nausea). °· Throwing up (vomiting). °· Abnormal stools. °DIAGNOSIS  °Your caregiver will take a history and perform a physical exam. Since many things can cause abdominal pain, other tests may be necessary. Tests may include: °· Blood tests. °· Urine tests. °· X-ray of the abdomen. °· CT scan of the abdomen. °Sometimes, surgery is needed to determine if diverticulitis or other conditions are causing your symptoms. °TREATMENT  °Most of the time, you can be treated without surgery. Treatment includes: °· Resting the bowels by only having liquids for a few days. As you improve, you will need to eat a low-fiber diet. °· Intravenous (IV) fluids if you are losing body fluids (dehydrated). °· Antibiotic medicines that treat infections may be given. °· Pain and nausea medicine, if needed. °· Surgery if the inflamed diverticulum has burst. °HOME CARE INSTRUCTIONS  °· Try a clear liquid diet (broth, tea, or water for as long as directed by your caregiver). You may then gradually begin a low-fiber diet as tolerated.  °A low-fiber diet is a diet with less than 10 grams of fiber. Choose the foods below to reduce fiber in the diet: °· White breads, cereals, rice, and pasta. °· Cooked fruits and vegetables or soft fresh fruits and vegetables without the skin. °· Ground or  well-cooked tender beef, ham, veal, lamb, pork, or poultry. °· Eggs and seafood. °· After your diverticulitis symptoms have improved, your caregiver may put you on a high-fiber diet. A high-fiber diet includes 14 grams of fiber for every 1000 calories consumed. For a standard 2000 calorie diet, you would need 28 grams of fiber. Follow these diet guidelines to help you increase the fiber in your diet. It is important to slowly increase the amount fiber in your diet to avoid gas, constipation, and bloating. °· Choose whole-grain breads, cereals, pasta, and brown rice. °· Choose fresh fruits and vegetables with the skin on. Do not overcook vegetables because the more vegetables are cooked, the more fiber is lost. °· Choose more nuts, seeds, legumes, dried peas, beans, and lentils. °· Look for food products that have greater than 3 grams of fiber per serving on the Nutrition Facts label. °· Take all medicine as directed by your caregiver. °· If your caregiver has given you a follow-up appointment, it is very important that you go. Not going could result in lasting (chronic) or permanent injury, pain, and disability. If there is any problem keeping the appointment, call to reschedule. °SEEK MEDICAL CARE IF:  °· Your pain does not improve. °· You have a hard time advancing your diet beyond clear liquids. °· Your bowel movements do not return to normal. °SEEK IMMEDIATE MEDICAL CARE IF:  °· Your pain becomes worse. °· You have an oral temperature above 102° F (38.9° C), not controlled by medicine. °· You have repeated vomiting. °· You have bloody or black, tarry stools. °·   Symptoms that brought you to your caregiver become worse or are not getting better. °MAKE SURE YOU:  °· Understand these instructions. °· Will watch your condition. °· Will get help right away if you are not doing well or get worse. °Document Released: 04/29/2005 Document Revised: 10/12/2011 Document Reviewed: 08/25/2010 °[employer]® Patient Information  ©2014 [employer]. ° °

## 2013-09-21 NOTE — Progress Notes (Signed)
Subjective:    Patient ID: [name], female    DOB: August 28, 1927, 80 y.o.   MRN: [id number]  Diarrhea  This is a recurrent problem. The current episode started more than 1 month ago. The problem occurs 2 to 4 times per day. The problem has been waxing and waning. Diarrhea characteristics: small finger width size stool that is described as a "explosion" Pertinent negatives include no abdominal pain, fever or vomiting. Exacerbated by: history of diverticulitis. She has tried nothing for the symptoms. Her past medical history is significant for bowel resection. hemrrhoids      Review of Systems  Constitutional: Negative.  Negative for fever.  Gastrointestinal: Positive for diarrhea and rectal pain. Negative for vomiting and abdominal pain.       Bright red blood with passing of stool; hx of hemrrhoids  Genitourinary: Negative.   Musculoskeletal: Negative.   Neurological: Negative.   Hematological: Negative.        Past Medical History  Diagnosis Date  . GERD (gastroesophageal reflux disease)   . History of TIAs   . Diverticulosis   . PUD (peptic ulcer disease)   . PVD (peripheral vascular disease)   . Hyperlipidemia   . Anxiety   . Depression   . Blood transfusion     "w/1st miscarriage" (05/26/2013)  . COPD (chronic obstructive pulmonary disease)   . Hypertension   . PONV (postoperative nausea and vomiting)   . Family history of anesthesia complication     "sisters also get sick as a dog" (05/26/2013)  . Coronary artery disease   . Heart murmur     "when I was a child" (05/26/2013)  . DVT (deep venous thrombosis) 1954    "after I had my first child" (05/26/2013)  . Pneumonia 2013    "once" (05/26/2013)  . Exertional shortness of breath   . H/O hiatal hernia   . Stroke 10/2002    no residual  . Stroke 05/26/2013    "very mild; affected my speech for a while" (05/26/2013)  . Arthritis     "lots; hands" (05/26/2013)  . Laryngeal carcinoma hx of ca    remotely  with resection and xrt/chronic hoarsemenss  . Lymphona, mantle cell, inguinal region/lower limb     "large c-cell" (05/26/2013)    History   Social History  . Marital Status: Married    Spouse Name: N/A    Number of Children: 6  . Years of Education: N/A   Occupational History  . retired    Social History Main Topics  . Smoking status: Former Smoker -- 2.50 packs/day for 37 years    Types: Cigarettes    Quit date: 09/05/1984  . Smokeless tobacco: Never Used  . Alcohol Use: No  . Drug Use: No  . Sexual Activity: Not Currently   Other Topics Concern  . Not on file   Social History Narrative  . No narrative on file    Past Surgical History  Procedure Laterality Date  . 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 Tubal ligation  1972  . Radical neck dissection  1986    "larynx cancer" (05/26/2013)  . Total knee arthroplasty Bilateral     2002 left 2008 right  . Ileostomy closure  01/2010  . Cataract extraction  2005    bilateral  . Carotid endarterectomy Bilateral 2004    2004 a month apart right and left  . Tonsillectomy and adenoidectomy  1936    "  tonsils grew back; no 2nd OR" (05/26/2013)  . Hernia repair  05/2010    ventral hernia repair  . Carpal tunnel release Left ?1960's  . Abdominal exploration surgery  12/01/2012  . Appendectomy  09/13/2008  . Colon surgery  09/13/2008    Laparoscopic assisted converted to open sigmoid colectomy.Archie Endo 09/13/2008 (05/26/2013)  . Cataract extraction w/ intraocular lens  implant, bilateral Bilateral ~ 2003  . Dilation and curettage of uterus  1950's    "had 2 for miscarriages" (05/26/2013)    Family History  Problem Relation Age of Onset  . Colon cancer Sister     colon  . Hyperlipidemia Mother   . Stroke Mother   . Cancer Father     mesothelioma    Allergies  Allergen Reactions  . Ativan [Lorazepam] Other (See Comments)    Severe hallucinations   .  Propoxyphene Hcl     Rxn: unknown  . Vancomycin   . Penicillins Rash    Current Outpatient Prescriptions on File Prior to Visit  Medication Sig Dispense Refill  . ALPRAZolam (XANAX) 0.5 MG tablet Take 1 tablet (0.5 mg total) by mouth at bedtime as needed for sleep.  30 tablet  3  . bisoprolol (ZEBETA) 5 MG tablet Take 5 mg by mouth daily at 12 noon.       . colestipol (COLESTID) 5 G packet Take 5 g by mouth 2 (two) times daily.  30 each  12  . levETIRAcetam (KEPPRA) 750 MG tablet Take 1 tablet (750 mg total) by mouth 2 (two) times daily.  60 tablet  3  . loperamide (IMODIUM) 2 MG capsule Take 2 mg by mouth daily as needed for diarrhea or loose stools.       . Rivaroxaban (XARELTO) 15 MG TABS tablet Take 1 tablet (15 mg total) by mouth daily.  30 tablet  2  . Vitamin D, Ergocalciferol, (DRISDOL) 50000 UNITS CAPS Take 50,000 Units by mouth every Sunday.       No current facility-administered medications on file prior to visit.    BP 120/76  Pulse 76  Ht 5' 4.5" (1.638 m)  Wt 182 lb (82.555 kg)  BMI 30.77 kg/m2chart Objective:   Physical Exam  Constitutional: She appears well-developed and well-nourished.  HENT:  Head: Normocephalic and atraumatic.  Neck: Normal range of motion.  Cardiovascular: Normal rate, regular rhythm and normal heart sounds.   Pulmonary/Chest: Effort normal and breath sounds normal.  Abdominal: Soft.  Hyperactive bowel sounds to RUQ  Skin: Skin is warm and dry.          Assessment & Plan:  [name] was seen today for diverticulitis.  Diagnoses and associated orders for this visit:  Diarrhea - Basic Metabolic Panel - CBC with Differential  Diverticulitis of colon (without mention of hemorrhage)  Other Orders - moxifloxacin (AVELOX) 400 MG tablet; Take 1 tablet (400 mg total) by mouth daily.   Note by Wilmon Pali, FNP Student

## 2013-09-22 ENCOUNTER — Other Ambulatory Visit: Payer: Self-pay | Admitting: *Deleted

## 2013-09-22 MED ORDER — CHOLESTYRAMINE 4 GM/DOSE PO POWD: 4.0000 g | Freq: Two times a day (BID) | ORAL | Status: DC

## 2013-09-26 ENCOUNTER — Other Ambulatory Visit: Payer: Self-pay | Admitting: Internal Medicine

## 2013-10-19 ENCOUNTER — Telehealth: Payer: Self-pay | Admitting: Internal Medicine

## 2013-10-26 ENCOUNTER — Encounter: Payer: Self-pay | Admitting: Family

## 2013-10-26 ENCOUNTER — Telehealth: Payer: Self-pay | Admitting: Internal Medicine

## 2013-10-26 ENCOUNTER — Ambulatory Visit (INDEPENDENT_AMBULATORY_CARE_PROVIDER_SITE_OTHER): Payer: Medicare Other | Admitting: Family

## 2013-10-26 VITALS — BP 124/62 | HR 84 | Temp 98.6°F | Ht 64.25 in | Wt 186.0 lb

## 2013-10-26 DIAGNOSIS — M2550 Pain in unspecified joint: Secondary | ICD-10-CM

## 2013-10-26 DIAGNOSIS — M129 Arthropathy, unspecified: Secondary | ICD-10-CM

## 2013-10-26 DIAGNOSIS — M1990 Unspecified osteoarthritis, unspecified site: Secondary | ICD-10-CM

## 2013-10-26 LAB — COMPREHENSIVE METABOLIC PANEL
ALT: 20 U/L (ref 0–35)
AST: 21 U/L (ref 0–37)
Albumin: 4.1 g/dL (ref 3.5–5.2)
Alkaline Phosphatase: 74 U/L (ref 39–117)
BUN: 13 mg/dL (ref 6–23)
CO2: 32 mEq/L (ref 19–32)
Calcium: 9.9 mg/dL (ref 8.4–10.5)
Chloride: 103 mEq/L (ref 96–112)
Creatinine, Ser: 1 mg/dL (ref 0.4–1.2)
GFR: 55.55 mL/min — ABNORMAL LOW (ref >60.00)
Glucose, Bld: 91 mg/dL (ref 70–99)
POTASSIUM: 4.5 meq/L (ref 3.5–5.1)
SODIUM: 139 meq/L (ref 135–145)
TOTAL PROTEIN: 7.3 g/dL (ref 6.0–8.3)
Total Bilirubin: 0.4 mg/dL (ref 0.3–1.2)

## 2013-10-26 MED ORDER — CELECOXIB 100 MG PO CAPS: 100.0000 mg | ORAL_CAPSULE | Freq: Two times a day (BID) | ORAL | Status: DC

## 2013-10-26 NOTE — Progress Notes (Signed)
Subjective:    Patient ID: [name], female    DOB: 06-22-1928, 80 y.o.   MRN: [id number]  HPI   80 year old white female, nonsmoker is in today with complaints of multiple joint pain and swelling x10 days. She has not taken any medication for relief. It parent about mixing medications and is requesting advice. Has a known history of osteoarthritis. Pain is a 6/10, worse with movement.   Review of Systems  Constitutional: Negative.   Cardiovascular: Negative.   Gastrointestinal: Negative.   Endocrine: Negative.   Genitourinary: Negative.   Musculoskeletal: Positive for arthralgias.  Skin: Negative.   Neurological: Negative.   Psychiatric/Behavioral: Negative.    Past Medical History  Diagnosis Date  . GERD (gastroesophageal reflux disease)   . History of TIAs   . Diverticulosis   . PUD (peptic ulcer disease)   . PVD (peripheral vascular disease)   . Hyperlipidemia   . Anxiety   . Depression   . Blood transfusion     "w/1st miscarriage" (05/26/2013)  . COPD (chronic obstructive pulmonary disease)   . Hypertension   . PONV (postoperative nausea and vomiting)   . Family history of anesthesia complication     "sisters also get sick as a dog" (05/26/2013)  . Coronary artery disease   . Heart murmur     "when I was a child" (05/26/2013)  . DVT (deep venous thrombosis) 1954    "after I had my first child" (05/26/2013)  . Pneumonia 2013    "once" (05/26/2013)  . Exertional shortness of breath   . H/O hiatal hernia   . Stroke 10/2002    no residual  . Stroke 05/26/2013    "very mild; affected my speech for a while" (05/26/2013)  . Arthritis     "lots; hands" (05/26/2013)  . 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 Lymphona, mantle cell, inguinal region/lower limb     "large c-cell" (05/26/2013)    History   Social History  . Marital Status: Married    Spouse Name: N/A    Number of Children: 6  . Years of  Education: N/A   Occupational History  . retired    Social History Main Topics  . Smoking status: Former Smoker -- 2.50 packs/day for 37 years    Types: Cigarettes    Quit date: 09/05/1984  . Smokeless tobacco: Never Used  . Alcohol Use: No  . Drug Use: No  . Sexual Activity: Not Currently   Other Topics Concern  . Not on file   Social History Narrative  . No narrative on file    Past Surgical History  Procedure Laterality Date  . 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 Tubal ligation  1972  . Radical neck dissection  1986    "larynx cancer" (05/26/2013)  . Total knee arthroplasty Bilateral     2002 left 2008 right  . Ileostomy closure  01/2010  . Cataract extraction  2005    bilateral  . Carotid endarterectomy Bilateral 2004    2004 a month apart right and left  . Tonsillectomy and adenoidectomy  1936    "tonsils grew back; no 2nd OR" (05/26/2013)  . Hernia repair  05/2010    ventral hernia repair  . Carpal tunnel release Left ?1960's  . Abdominal exploration surgery  12/01/2012  . Appendectomy  09/13/2008  . Colon surgery  09/13/2008  Laparoscopic assisted converted to open sigmoid colectomy.Archie Endo 09/13/2008 (05/26/2013)  . Cataract extraction w/ intraocular lens  implant, bilateral Bilateral ~ 2003  . Dilation and curettage of uterus  1950's    "had 2 for miscarriages" (05/26/2013)    Family History  Problem Relation Age of Onset  . Colon cancer Sister     colon  . Hyperlipidemia Mother   . Stroke Mother   . Cancer Father     mesothelioma    Allergies  Allergen Reactions  . Ativan [Lorazepam] Other (See Comments)    Severe hallucinations   . Propoxyphene Hcl     Rxn: unknown  . Vancomycin   . Penicillins Rash    Current Outpatient Prescriptions on File Prior to Visit  Medication Sig Dispense Refill  . ALPRAZolam (XANAX) 0.5 MG tablet Take 1 tablet (0.5 mg total) by mouth at bedtime as needed  for sleep.  30 tablet  3  . bisoprolol (ZEBETA) 5 MG tablet Take 5 mg by mouth daily at 12 noon.       . cholestyramine (QUESTRAN) 4 GM/DOSE powder Take 1 packet (4 g total) by mouth 2 (two) times daily with a meal.  378 g  12  . levETIRAcetam (KEPPRA) 750 MG tablet Take 1 tablet (750 mg total) by mouth 2 (two) times daily.  60 tablet  3  . loperamide (IMODIUM) 2 MG capsule Take 2 mg by mouth daily as needed for diarrhea or loose stools.       . Vitamin D, Ergocalciferol, (DRISDOL) 50000 UNITS CAPS Take 50,000 Units by mouth every Sunday.      Alveda Reasons 15 MG TABS tablet TAKE ONE TABLET BY MOUTH ONCE DAILY AFTER SUPPER  30 tablet  6  . moxifloxacin (AVELOX) 400 MG tablet Take 1 tablet (400 mg total) by mouth daily.  10 tablet  0   No current facility-administered medications on file prior to visit.    BP 124/62  Pulse 84  Temp(Src) 98.6 F (37 C)  Ht 5' 4.25" (1.632 m)  Wt 186 lb (84.369 kg)  BMI 31.68 kg/m2chart    Objective:   Physical Exam  Constitutional: She is oriented to person, place, and time. She appears well-developed and well-nourished.  HENT:  Right Ear: External ear normal.  Left Ear: External ear normal.  Nose: Nose normal.  Mouth/Throat: Oropharynx is clear and moist.  Neck: Normal range of motion. Neck supple.  Cardiovascular: Normal rate, regular rhythm and normal heart sounds.   Pulmonary/Chest: Effort normal and breath sounds normal.  Abdominal: Soft. Bowel sounds are normal.  Musculoskeletal: She exhibits tenderness.  Multiple joint tenderness to include the hands bilaterally, knees, ankles, wrists. Mild swelling. No warmth to touch.  Neurological: She is alert and oriented to person, place, and time.  Skin: Skin is warm and dry.  Psychiatric: She has a normal mood and affect.          Assessment & Plan:  Derika was seen today for arthritis.  Diagnoses and associated orders for this visit:  Joint pain - Rheumatoid  factor - CMP  Arthritis  Other Orders - celecoxib (CELEBREX) 100 MG capsule; Take 1 capsule (100 mg total) by mouth 2 (two) times daily.

## 2013-10-26 NOTE — Telephone Encounter (Signed)
Error/gd °

## 2013-10-26 NOTE — Patient Instructions (Signed)

## 2013-10-26 NOTE — Telephone Encounter (Signed)
Error

## 2013-10-27 LAB — RHEUMATOID FACTOR: Rhuematoid fact SerPl-aCnc: <10 IU/mL (ref <=14)

## 2013-10-30 ENCOUNTER — Encounter: Payer: Self-pay | Admitting: Family

## 2013-10-30 ENCOUNTER — Ambulatory Visit: Payer: Medicare Other | Admitting: Neurology

## 2013-11-01 ENCOUNTER — Telehealth: Payer: Self-pay | Admitting: Internal Medicine

## 2013-11-01 MED ORDER — METHYLPREDNISOLONE (PAK) 4 MG PO TABS: ORAL_TABLET | ORAL | Status: DC

## 2013-11-01 NOTE — Telephone Encounter (Signed)
Medrol dosepak sent to pharmacy, per [facility]

## 2013-11-01 NOTE — Telephone Encounter (Signed)
Pt is still having severe hand pains [facility] [address].

## 2013-11-07 ENCOUNTER — Other Ambulatory Visit: Payer: Self-pay | Admitting: Family

## 2013-11-07 ENCOUNTER — Telehealth: Payer: Self-pay | Admitting: Internal Medicine

## 2013-11-07 NOTE — Telephone Encounter (Signed)
Ok to fill 

## 2013-11-07 NOTE — Telephone Encounter (Signed)
Left message to advise pt Rx sent

## 2013-11-07 NOTE — Telephone Encounter (Signed)
Patient Information:  Caller Name: Percell Miller  Phone: [phone]  Patient: [name], [name]  Gender: Female  DOB: Apr 03, 1928  Age: 80 Years  PCP: Benay Pillow (Adults only)  Office Follow Up:  Does the office need to follow up with this patient?: Yes  Instructions For The Office: This pt. is still having a lot of pain and swelling in her hands and thumbs. Unable to use them and in a lot of pain. Prednisone helped the swelling but ends today. Did not stay on the Celebrex. Did not think it helped. Needs an appt. Please advise.  RN Note:  Will need an appt.  Symptoms  Reason For Call & Symptoms: Has arthritis in both hands and saw the Dr. Lajoyce Corners  days ago.Did Blood work for RA and it was negative. Started on Celebrex and it did not help. Called in after one week and they gave Prednisone last week. Worked great, but today is the last pill and thumbs are swollen and the joints are so painful, cannot move her hands. Has been moving and that has not helped.  Reviewed Health History In EMR: Yes  Reviewed Medications In EMR: Yes  Reviewed Allergies In EMR: Yes  Reviewed Surgeries / Procedures: No  Date of Onset of Symptoms: 10/26/2013  Treatments Tried: Prednisone and Celebrex  Treatments Tried Worked: No  Guideline(s) Used:  Hand and Wrist Pain  Disposition Per Guideline:   Go to Office Now  Reason For Disposition Reached:   Severe pain (e.g., excruciating, unable to use hand at all)  Advice Given:  Call Back If:  You become worse.  Patient Will Follow Care Advice:  YES

## 2013-11-07 NOTE — Telephone Encounter (Signed)
I spoke with pt's spouse and she is going to schedule to see another provider here on 11/08/13.

## 2013-11-08 ENCOUNTER — Ambulatory Visit (INDEPENDENT_AMBULATORY_CARE_PROVIDER_SITE_OTHER): Payer: Medicare Other | Admitting: Family Medicine

## 2013-11-08 ENCOUNTER — Encounter: Payer: Self-pay | Admitting: Family Medicine

## 2013-11-08 VITALS — BP 120/76 | HR 72 | Temp 97.6°F | Wt 186.0 lb

## 2013-11-08 DIAGNOSIS — M159 Polyosteoarthritis, unspecified: Secondary | ICD-10-CM

## 2013-11-08 MED ORDER — TRAMADOL HCL 50 MG PO TABS: 50.0000 mg | ORAL_TABLET | Freq: Four times a day (QID) | ORAL | Status: DC | PRN

## 2013-11-08 NOTE — Progress Notes (Signed)
Subjective:    Patient ID: [name], female    DOB: 07-Aug-1927, 80 y.o.   MRN: [id number]  HPI Patient seen with bilateral arthralgias mostly involving hands. She was seen recently and prescribed prednisone which did help. She tried some Celebrex which did not help. She is describing involvement mostly involvement of DIP and PIP joints of both hands. No visible erythema. No warmth. She also has bilateral ankle pains. She has known osteoarthritis and has bilateral knee replacements previously. Duration of symptoms about 2 weeks. Worse after doing frequent use of hands with moving and boxing. She had recent rheumatoid factor and comprehensive metabolic panel which were unremarkable  Heat tends to help her symptoms somewhat. No skin rashes.  Past Medical History  Diagnosis Date  . GERD (gastroesophageal reflux disease)   . History of TIAs   . Diverticulosis   . PUD (peptic ulcer disease)   . PVD (peripheral vascular disease)   . Hyperlipidemia   . Anxiety   . Depression   . Blood transfusion     "w/1st miscarriage" (05/26/2013)  . COPD (chronic obstructive pulmonary disease)   . Hypertension   . PONV (postoperative nausea and vomiting)   . Family history of anesthesia complication     "sisters also get sick as a dog" (05/26/2013)  . Coronary artery disease   . Heart murmur     "when I was a child" (05/26/2013)  . DVT (deep venous thrombosis) 1954    "after I had my first child" (05/26/2013)  . Pneumonia 2013    "once" (05/26/2013)  . Exertional shortness of breath   . H/O hiatal hernia   . Stroke 10/2002    no residual  . Stroke 05/26/2013    "very mild; affected my speech for a while" (05/26/2013)  . Arthritis     "lots; hands" (05/26/2013)  . 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 Lymphona, mantle cell, inguinal region/lower limb     "large c-cell" (05/26/2013)   Past Surgical History  Procedure Laterality Date    . 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 Tubal ligation  1972  . Radical neck dissection  1986    "larynx cancer" (05/26/2013)  . Total knee arthroplasty Bilateral     2002 left 2008 right  . Ileostomy closure  01/2010  . Cataract extraction  2005    bilateral  . Carotid endarterectomy Bilateral 2004    2004 a month apart right and left  . Tonsillectomy and adenoidectomy  1936    "tonsils grew back; no 2nd OR" (05/26/2013)  . Hernia repair  05/2010    ventral hernia repair  . Carpal tunnel release Left ?1960's  . Abdominal exploration surgery  12/01/2012  . Appendectomy  09/13/2008  . Colon surgery  09/13/2008    Laparoscopic assisted converted to open sigmoid colectomy.Archie Endo 09/13/2008 (05/26/2013)  . Cataract extraction w/ intraocular lens  implant, bilateral Bilateral ~ 2003  . Dilation and curettage of uterus  1950's    "had 2 for miscarriages" (05/26/2013)    reports that she quit smoking about 29 years ago. Her smoking use included Cigarettes. She has a 92.5 pack-year smoking history. She has never used smokeless tobacco. She reports that she does not drink alcohol or use illicit drugs. family history includes Cancer in her father; Colon cancer in her sister; Hyperlipidemia in her mother; Stroke in her mother. Allergies  Allergen Reactions  . Ativan [Lorazepam] Other (See Comments)    Severe hallucinations   . Propoxyphene Hcl     Rxn: unknown  . Vancomycin   . Penicillins Rash      Review of Systems  Constitutional: Negative for fever and chills.  Musculoskeletal: Positive for arthralgias.  Skin: Negative for rash.  Hematological: Negative for adenopathy.       Objective:   Physical Exam  Constitutional: She appears well-developed and well-nourished. No distress.  Cardiovascular: Normal rate.   Pulmonary/Chest: Effort normal and breath sounds normal. No respiratory distress. She has no wheezes. She has no  rales.  Musculoskeletal: She exhibits no edema.  Hands and ankles revealed no erythema or warmth. She has some mild tenderness in palpating the PIP and DIP joints of both hands bilaterally. No MCP joint tenderness          Assessment & Plan:  Osteoarthritis involving hands. She is not a candidate for nonsteroidals with her history of chronic anticoagulants and also history of chronic kidney disease. She has not tried any Tylenol consistently. She'll try Tylenol and not seeing relief with that we wrote for limited tramadol 50 mg one every 6 hours as needed

## 2013-11-08 NOTE — Progress Notes (Signed)
Pre visit review using our clinic review tool, if applicable. No additional management support is needed unless otherwise documented below in the visit note. 

## 2013-11-08 NOTE — Patient Instructions (Signed)
Osteoarthritis Osteoarthritis is a disease that causes soreness and swelling (inflammation) of a joint. It occurs when the cartilage at the affected joint wears down. Cartilage acts as a cushion, covering the ends of bones where they meet to form a joint. Osteoarthritis is the most common form of arthritis. It often occurs in older people. The joints affected most often by this condition include those in the:  Ends of the fingers.  Thumbs.  Neck.  Lower back.  Knees.  Hips. CAUSES  Over time, the cartilage that covers the ends of bones begins to wear away. This causes bone to rub on bone, producing pain and stiffness in the affected joints.  RISK FACTORS Certain factors can increase your chances of having osteoarthritis, including:  Older age.  Excessive body weight.  Overuse of joints. SIGNS AND SYMPTOMS   Pain, swelling, and stiffness in the joint.  Over time, the joint may lose its normal shape.  Small deposits of bone (osteophytes) may grow on the edges of the joint.  Bits of bone or cartilage can break off and float inside the joint space. This may cause more pain and damage. DIAGNOSIS  Your health care provider will do a physical exam and ask about your symptoms. Various tests may be ordered, such as:  X-rays of the affected joint.  An MRI scan.  Blood tests to rule out other types of arthritis.  Joint fluid tests. This involves using a needle to draw fluid from the joint and examining the fluid under a microscope. TREATMENT  Goals of treatment are to control pain and improve joint function. Treatment plans may include:  A prescribed exercise program that allows for rest and joint relief.  A weight control plan.  Pain relief techniques, such as:  Properly applied heat and cold.  Electric pulses delivered to nerve endings under the skin (transcutaneous electrical nerve stimulation, TENS).  Massage.  Certain nutritional supplements.  Medicines to  control pain, such as:  Acetaminophen.  Nonsteroidal anti-inflammatory drugs (NSAIDs), such as naproxen.  Narcotic or central-acting agents, such as tramadol.  Corticosteroids. These can be given orally or as an injection.  Surgery to reposition the bones and relieve pain (osteotomy) or to remove loose pieces of bone and cartilage. Joint replacement may be needed in advanced states of osteoarthritis. HOME CARE INSTRUCTIONS   Only take over-the-counter or prescription medicines as directed by your health care provider. Take all medicines exactly as instructed.  Maintain a healthy weight. Follow your health care provider's instructions for weight control. This may include dietary instructions.  Exercise as directed. Your health care provider can recommend specific types of exercise. These may include:  Strengthening exercises These are done to strengthen the muscles that support joints affected by arthritis. They can be performed with weights or with exercise bands to add resistance.  Aerobic activities These are exercises, such as brisk walking or low-impact aerobics, that get your heart pumping.  Range-of-motion activities These keep your joints limber.  Balance and agility exercises These help you maintain daily living skills.  Rest your affected joints as directed by your health care provider.  Follow up with your health care provider as directed. SEEK MEDICAL CARE IF:   Your skin turns red.  You develop a rash in addition to your joint pain.  You have worsening joint pain. SEEK IMMEDIATE MEDICAL CARE IF:  You have a significant loss of weight or appetite.  You have a fever along with joint or muscle aches.  You have   night sweats. FOR MORE INFORMATION  National Institute of Arthritis and Musculoskeletal and Skin Diseases: www.niams.nih.gov National Institute on Aging: www.nia.nih.gov American College of Rheumatology: www.rheumatology.org Document Released: 07/20/2005  Document Revised: 05/10/2013 Document Reviewed: 03/27/2013 [employer] Patient Information 2014 [employer].  

## 2013-11-27 ENCOUNTER — Other Ambulatory Visit: Payer: Self-pay | Admitting: Internal Medicine

## 2013-12-23 ENCOUNTER — Encounter: Payer: Self-pay | Admitting: Family Medicine

## 2013-12-23 ENCOUNTER — Ambulatory Visit (INDEPENDENT_AMBULATORY_CARE_PROVIDER_SITE_OTHER): Payer: Medicare Other | Admitting: Family Medicine

## 2013-12-23 VITALS — BP 100/70 | HR 68 | Ht 64.25 in | Wt 185.2 lb

## 2013-12-23 DIAGNOSIS — R3915 Urgency of urination: Secondary | ICD-10-CM

## 2013-12-23 DIAGNOSIS — N390 Urinary tract infection, site not specified: Secondary | ICD-10-CM

## 2013-12-23 LAB — POCT URINALYSIS DIPSTICK
Bilirubin, UA: NEGATIVE
Glucose, UA: NEGATIVE
KETONES UA: NEGATIVE
Nitrite, UA: NEGATIVE
PROTEIN UA: NEGATIVE
Urobilinogen, UA: 0.2
pH, UA: 5

## 2013-12-23 MED ORDER — CIPROFLOXACIN HCL 500 MG PO TABS: 500.0000 mg | ORAL_TABLET | Freq: Two times a day (BID) | ORAL | Status: DC

## 2013-12-23 NOTE — Progress Notes (Signed)
Subjective:    Patient ID: [name], female    DOB: 1928/04/23, 80 y.o.   MRN: [id number]  Urinary Tract Infection  Associated symptoms include frequency. Pertinent negatives include no chills, nausea or vomiting.   Acute Saturday clinic visit. Patient seen with one week history of some urine frequency and burning with urination. She's not had any fever, nausea, vomiting, or any flank pain. No abdominal pain. She has history of allergy to penicillin. She has taken Cipro without difficulty in the past.  Past Medical History  Diagnosis Date  . GERD (gastroesophageal reflux disease)   . History of TIAs   . Diverticulosis   . PUD (peptic ulcer disease)   . PVD (peripheral vascular disease)   . Hyperlipidemia   . Anxiety   . Depression   . Blood transfusion     "w/1st miscarriage" (05/26/2013)  . COPD (chronic obstructive pulmonary disease)   . Hypertension   . PONV (postoperative nausea and vomiting)   . Family history of anesthesia complication     "sisters also get sick as a dog" (05/26/2013)  . Coronary artery disease   . Heart murmur     "when I was a child" (05/26/2013)  . DVT (deep venous thrombosis) 1954    "after I had my first child" (05/26/2013)  . Pneumonia 2013    "once" (05/26/2013)  . Exertional shortness of breath   . H/O hiatal hernia   . Stroke 10/2002    no residual  . Stroke 05/26/2013    "very mild; affected my speech for a while" (05/26/2013)  . Arthritis     "lots; hands" (05/26/2013)  . 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 Lymphona, mantle cell, inguinal region/lower limb     "large c-cell" (05/26/2013)   Past Surgical History  Procedure Laterality Date  . 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 Tubal ligation  1972  . Radical neck dissection  1986    "larynx cancer" (05/26/2013)  . Total knee arthroplasty Bilateral     2002 left  2008 right  . Ileostomy closure  01/2010  . Cataract extraction  2005    bilateral  . Carotid endarterectomy Bilateral 2004    2004 a month apart right and left  . Tonsillectomy and adenoidectomy  1936    "tonsils grew back; no 2nd OR" (05/26/2013)  . Hernia repair  05/2010    ventral hernia repair  . Carpal tunnel release Left ?1960's  . Abdominal exploration surgery  12/01/2012  . Appendectomy  09/13/2008  . Colon surgery  09/13/2008    Laparoscopic assisted converted to open sigmoid colectomy.Archie Endo 09/13/2008 (05/26/2013)  . Cataract extraction w/ intraocular lens  implant, bilateral Bilateral ~ 2003  . Dilation and curettage of uterus  1950's    "had 2 for miscarriages" (05/26/2013)    reports that she quit smoking about 29 years ago. Her smoking use included Cigarettes. She has a 92.5 pack-year smoking history. She has never used smokeless tobacco. She reports that she does not drink alcohol or use illicit drugs. family history includes Cancer in her father; Colon cancer in her sister; Hyperlipidemia in her mother; Stroke in her mother. Allergies  Allergen Reactions  . Ativan [Lorazepam] Other (See Comments)    Severe hallucinations   . Propoxyphene Hcl     Rxn: unknown  . Vancomycin   . Penicillins Rash  Review of Systems  Constitutional: Negative for fever, chills and appetite change.  Gastrointestinal: Negative for nausea, vomiting, abdominal pain, diarrhea and constipation.  Genitourinary: Positive for dysuria and frequency.  Musculoskeletal: Negative for back pain.  Neurological: Negative for dizziness.       Objective:   Physical Exam  Constitutional: She appears well-developed and well-nourished.  Cardiovascular: Normal rate.   Pulmonary/Chest: Effort normal and breath sounds normal. No respiratory distress. She has no wheezes. She has no rales.  Musculoskeletal:  No flank tenderness          Assessment & Plan:  Probable uncomplicated cystitis.  Ciprofloxacin 500 milligrams twice a day for 7 days. Increase fluid intake. Urine cx sent.

## 2013-12-23 NOTE — Patient Instructions (Signed)
Urinary Tract Infection  Urinary tract infections (UTIs) can develop anywhere along your urinary tract. Your urinary tract is your body's drainage system for removing wastes and extra water. Your urinary tract includes two kidneys, two ureters, a bladder, and a urethra. Your kidneys are a pair of bean-shaped organs. Each kidney is about the size of your fist. They are located below your ribs, one on each side of your spine.  CAUSES  Infections are caused by microbes, which are microscopic organisms, including fungi, viruses, and bacteria. These organisms are so small that they can only be seen through a microscope. Bacteria are the microbes that most commonly cause UTIs.  SYMPTOMS   Symptoms of UTIs may vary by age and gender of the patient and by the location of the infection. Symptoms in young women typically include a frequent and intense urge to urinate and a painful, burning feeling in the bladder or urethra during urination. Older women and men are more likely to be tired, shaky, and weak and have muscle aches and abdominal pain. A fever may mean the infection is in your kidneys. Other symptoms of a kidney infection include pain in your back or sides below the ribs, nausea, and vomiting.  DIAGNOSIS  To diagnose a UTI, your caregiver will ask you about your symptoms. Your caregiver also will ask to provide a urine sample. The urine sample will be tested for bacteria and white blood cells. White blood cells are made by your body to help fight infection.  TREATMENT   Typically, UTIs can be treated with medication. Because most UTIs are caused by a bacterial infection, they usually can be treated with the use of antibiotics. The choice of antibiotic and length of treatment depend on your symptoms and the type of bacteria causing your infection.  HOME CARE INSTRUCTIONS   If you were prescribed antibiotics, take them exactly as your caregiver instructs you. Finish the medication even if you feel better after you  have only taken some of the medication.   Drink enough water and fluids to keep your urine clear or pale yellow.   Avoid caffeine, tea, and carbonated beverages. They tend to irritate your bladder.   Empty your bladder often. Avoid holding urine for long periods of time.   Empty your bladder before and after sexual intercourse.   After a bowel movement, women should cleanse from front to back. Use each tissue only once.  SEEK MEDICAL CARE IF:    You have back pain.   You develop a fever.   Your symptoms do not begin to resolve within 3 days.  SEEK IMMEDIATE MEDICAL CARE IF:    You have severe back pain or lower abdominal pain.   You develop chills.   You have nausea or vomiting.   You have continued burning or discomfort with urination.  MAKE SURE YOU:    Understand these instructions.   Will watch your condition.   Will get help right away if you are not doing well or get worse.  Document Released: 04/29/2005 Document Revised: 01/19/2012 Document Reviewed: 08/28/2011  [employer] Patient Information 2014 [employer].

## 2013-12-23 NOTE — Progress Notes (Signed)
Pre visit review using our clinic review tool, if applicable. No additional management support is needed unless otherwise documented below in the visit note. 

## 2013-12-26 ENCOUNTER — Ambulatory Visit: Payer: Medicare Other | Admitting: Neurology

## 2013-12-26 ENCOUNTER — Other Ambulatory Visit: Payer: Self-pay | Admitting: Internal Medicine

## 2013-12-26 ENCOUNTER — Encounter: Payer: Self-pay | Admitting: Family Medicine

## 2013-12-26 LAB — CULTURE, URINE COMPREHENSIVE: Colony Count: 55000

## 2014-01-02 ENCOUNTER — Telehealth: Payer: Self-pay | Admitting: Internal Medicine

## 2014-01-02 ENCOUNTER — Encounter: Payer: Self-pay | Admitting: Neurology

## 2014-01-02 ENCOUNTER — Ambulatory Visit (INDEPENDENT_AMBULATORY_CARE_PROVIDER_SITE_OTHER): Payer: Medicare Other | Admitting: Neurology

## 2014-01-02 VITALS — BP 140/80 | HR 70 | Temp 98.0°F | Resp 16 | Ht 64.0 in | Wt 184.3 lb

## 2014-01-02 DIAGNOSIS — I6340 Cerebral infarction due to embolism of unspecified cerebral artery: Secondary | ICD-10-CM

## 2014-01-02 DIAGNOSIS — G40209 Localization-related (focal) (partial) symptomatic epilepsy and epileptic syndromes with complex partial seizures, not intractable, without status epilepticus: Secondary | ICD-10-CM

## 2014-01-02 DIAGNOSIS — I639 Cerebral infarction, unspecified: Secondary | ICD-10-CM

## 2014-01-02 NOTE — Patient Instructions (Addendum)
I think you seem okay to drive.  I think the main concern is your confidence.  It has been several months since the stroke and seizures and you haven't driven all that time. 1.  We will refer you for a driving assessment. If you are interested in the driving assessment, you can contact [facility] [employer] in [place]. [phone]. 2.  We will refer you to physical therapy 3.  Continue Keppra 750mg  twice daily and Xarelto 4.  Follow up in 6 months.

## 2014-01-02 NOTE — Progress Notes (Signed)
NEUROLOGY FOLLOW UP OFFICE NOTE  [name] [id number]  HISTORY OF PRESENT ILLNESS: [name] is an 80 year old right-handed woman with history of hypertension, atrial fibrillation, peripheral vascular disease, anxiety, depression, B Cell lymphoma status post chemotherapy, and COPD who follows up for cardio-embolic stroke and seizure secondary to stroke.  She is accompanied by her husband and her daughter.  Records and images were personally reviewed where available.     UPDATE: She has been doing well.  She is on Keppra 750mg  twice daily.  No seizures.  She only occasionally uses the walker, such as at church, but overall has not been using it.  At home, she doesn't need to walk much.  Outside, she will mostly hold onto her husband or daughter.  She has not fallen but has had some near-falls, as she sometimes loses her balance. Her husband feels she tends to shuffle when she walks.  She was supposed to resume PT last visit, but this was never set up.  She is still not driving. She and her family feel hesitant about it but she would like to start driving again if possible.   HISTORY: She presented to the ED on 05/24/13 with confusion.  She had a negative head CT and was discharged.  She returned to the ED on 05/26/13 with aphasia.  An MRI of the brain was performed, which revealed an acute lacunar infarct in the left anterior corona radiata and lentiform nucleus.  CTA of the head revealed atherosclerotic changes in the cavernous carotid arteries without significant stenosis.  CTA of neck revealed focal 50% stenosis of mid right ICA.  2D Echo revealed 50-55% EF with no source of embolus.  Hgb A1c 5.9, LDL 97.  She was switched from [name] to Eliquis.  She was readmitted to the hospital on 05/31/13 with recurrent aphasia.  She had trouble getting her words out and then couldn't speak at all.  Repeat MRI of brain revealed new punctate infarct in the left posterior corona radiata, the  same location as previous infarct. No changes in management were made.  She was readmitted to the hospital on 06/07/13 for altered mental status.  During the night, her husband found her standing naked in the closet putting on a summer dress with a sweater hanging out of one arm.  She was amnestic to this event.  When she woke up early in the morning, she exhibited again expressive aphasia, gait imbalance and nausea.  MRI of brain revealed pseudonormalization on DWI of the more acute punctate infarct involving the posterior left corona radiata as well as typical evolution of the older subacute infarct involving the left lentiform nucleus and anterior corona radiata.  EEG was performed, which revealed intermittent infrequent left fronto-parieto-temporal theta slowing.  Due to possibility of seizure, she was started on Keppra 250mg  BID.  No change made to Eliquis.  She presented to the ED on 06/16/13 after waking up in the morning with expressive aphasia and right-sided weakness.  Symptoms improved in the ED.  CT head revealed prior lacunar infarct in left lentiform nucleus and corona radiata, but no new abnormalities.  It was suspected that she may have had another seizure and her Keppra was increased to 500mg  BID.  She was admitted to the hospital again on 06/21/13 again for expressive aphasia.  MRI brain revealed 2 new punctate infarcts in the bilateral hemispheres at level of centrum semiovale.  MRA of head was stable.  Carotid dopplers revealed right 1-39%  ICA stenosis and left 20-49% ICA stenosis.  2D echo stable.  Eliquis was changed to Xarelto. Keppra was increased to 750mg  BID.  A statin was added, as well.  She developed lethargy, so her Cymbalta and statin was discontinued and she felt better.  She was readmitted to the hospital on 06/26/13 for generalized weakness and shortness of breath.  She was found to have pneumonia and started on antibiotics.  A couple of days after finishing the antibiotics,  she developed a significant maculopapular rash on her neck and chest.  She was started on prednisone, and it is slowly improving.  Previously, she had an erythemic rash across her forehead, thought to be secondary to Vancomycin.  Throughout all her hospital stays, she has been rate-controlled.  The rash has improved with very little residual effect in one small area of chest.  Fogginess she experienced before on Keppra has also improved.  No seizures.  Due to stroke and pneumonia, she was undergone deconditioning and currently will be having physical therapy.  She has a walker but doesn't use it all the time.  She does not have a walker now.  She has history of gait problems after knee surgery, but she has been more unsteady since her prolonged hospital admissions.  Retinal migraines:  Of note, she has longstanding history of migraines, described as scotomas, fortification spectrum and flashing lights in the right eye.  There is no associated headache.  PAST MEDICAL HISTORY: Past Medical History  Diagnosis Date  . GERD (gastroesophageal reflux disease)   . History of TIAs   . Diverticulosis   . PUD (peptic ulcer disease)   . PVD (peripheral vascular disease)   . Hyperlipidemia   . Anxiety   . Depression   . Blood transfusion     "w/1st miscarriage" (05/26/2013)  . COPD (chronic obstructive pulmonary disease)   . Hypertension   . PONV (postoperative nausea and vomiting)   . Family history of anesthesia complication     "sisters also get sick as a dog" (05/26/2013)  . Coronary artery disease   . Heart murmur     "when I was a child" (05/26/2013)  . DVT (deep venous thrombosis) 1954    "after I had my first child" (05/26/2013)  . Pneumonia 2013    "once" (05/26/2013)  . Exertional shortness of breath   . H/O hiatal hernia   . Stroke 10/2002    no residual  . Stroke 05/26/2013    "very mild; affected my speech for a while" (05/26/2013)  . Arthritis     "lots; hands" (05/26/2013)  .  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 Lymphona, mantle cell, inguinal region/lower limb     "large c-cell" (05/26/2013)    MEDICATIONS: Current Outpatient Prescriptions on File Prior to Visit  Medication Sig Dispense Refill  . ALPRAZolam (XANAX) 0.5 MG tablet Take 1 tablet (0.5 mg total) by mouth at bedtime as needed for sleep.  30 tablet  3  . bisoprolol (ZEBETA) 5 MG tablet Take 5 mg by mouth daily at 12 noon.       . cholestyramine (QUESTRAN) 4 GM/DOSE powder Take 1 packet (4 g total) by mouth 2 (two) times daily with a meal.  378 g  12  . ciprofloxacin (CIPRO) 500 MG tablet Take 1 tablet (500 mg total) by mouth 2 (two) times daily.  14 tablet  0  . levETIRAcetam (KEPPRA) 750 MG tablet TAKE ONE  TABLET BY MOUTH TWICE DAILY  60 tablet  0  . loperamide (IMODIUM) 2 MG capsule Take 2 mg by mouth daily as needed for diarrhea or loose stools.       . traMADol (ULTRAM) 50 MG tablet Take 1 tablet (50 mg total) by mouth every 6 (six) hours as needed.  60 tablet  3  . Vitamin D, Ergocalciferol, (DRISDOL) 50000 UNITS CAPS Take 50,000 Units by mouth every Sunday.      Alveda Reasons 15 MG TABS tablet TAKE ONE TABLET BY MOUTH ONCE DAILY AFTER SUPPER  30 tablet  6   No current facility-administered medications on file prior to visit.    ALLERGIES: Allergies  Allergen Reactions  . Ativan [Lorazepam] Other (See Comments)    Severe hallucinations   . Propoxyphene Hcl     Rxn: unknown  . Vancomycin   . Penicillins Rash    FAMILY HISTORY: Family History  Problem Relation Age of Onset  . Colon cancer Sister     colon  . Hyperlipidemia Mother   . Stroke Mother   . Cancer Father     mesothelioma    SOCIAL HISTORY: History   Social History  . Marital Status: Married    Spouse Name: N/A    Number of Children: 6  . Years of Education: N/A   Occupational History  . retired    Social History Main Topics  . Smoking status: Former Smoker --  2.50 packs/day for 37 years    Types: Cigarettes    Quit date: 09/05/1984  . Smokeless tobacco: Never Used  . Alcohol Use: No  . Drug Use: No  . Sexual Activity: Not Currently   Other Topics Concern  . Not on file   Social History Narrative  . No narrative on file    REVIEW OF SYSTEMS: Constitutional: No fevers, chills, or sweats, no generalized fatigue, change in appetite Eyes: No visual changes, double vision, eye pain Ear, nose and throat: No hearing loss, ear pain, nasal congestion, sore throat Cardiovascular: No chest pain, palpitations Respiratory:  No shortness of breath at rest or with exertion, wheezes GastrointestinaI: No nausea, vomiting, diarrhea, abdominal pain, fecal incontinence Genitourinary:  No dysuria, urinary retention or frequency Musculoskeletal:  No neck pain, back pain Integumentary: No rash, pruritus, skin lesions Neurological: as above Psychiatric: No depression, insomnia, anxiety Endocrine: No palpitations, fatigue, diaphoresis, mood swings, change in appetite, change in weight, increased thirst Hematologic/Lymphatic:  No anemia, purpura, petechiae. Allergic/Immunologic: no itchy/runny eyes, nasal congestion, recent allergic reactions, rashes  PHYSICAL EXAM: Filed Vitals:   01/02/14 1539  BP: 140/80  Pulse: 70  Temp: 98 F (36.7 C)  Resp: 16   General: No acute distress Head:  Normocephalic/atraumatic Neck: supple, no paraspinal tenderness, full range of motion Heart:  Regular rate and rhythm Lungs:  Clear to auscultation bilaterally Back: No paraspinal tenderness Neurological Exam: alert and oriented to person, place, and time. Attention span and concentration intact, recent and remote memory intact, fund of knowledge intact.  Speech fluent and not dysarthric, language intact.  CN II-XII intact. Fundi not visualized.  Bulk and tone normal, muscle strength 5/5 throughout.  Sensation to temperature and vibration intact.  Deep tendon reflexes 2+  throughout, toes downgoing.  Finger to nose  intact.  Gait mildly wide-based but improved stride, Romberg negative.  IMPRESSION: 1. Cardio-embolic strokes   2. Seizure likely secondary to stroke.  No recurrent seizure since November. 3. Visual migraine aura without headache.  PLAN: 1.  Due to lack of confidence after several months of no driving since stroke and seizure, we will refer to OT for safety driving assessment. 2.  Refer back to PT to help with ambulation.  Her gait does seem improved.  However, she does not ambulate much at home and is still off-balance at times, so I feel PT would be helpful. 3.  Continue Keppra 750mg  twice daily 4.  Follow up in 6 months.  Metta Clines, DO  CC:  Benay Pillow, MD

## 2014-01-02 NOTE — Telephone Encounter (Signed)
Dr Tomi Likens wants pt to get a test for b12 and pt would like to get that here. Is that ok and can you put the order in if ok?

## 2014-01-02 NOTE — Telephone Encounter (Signed)
Pt and her husband are parents of bhumi godbey, who is a pt of yours. Most of her family sees you and wold like to know if you will accept her parents. pls advise if you will accept.

## 2014-01-03 NOTE — Telephone Encounter (Signed)
Unfortunately, I cannot take on any new patients at this time.  We should encourage follow up with one of new providers.

## 2014-01-03 NOTE — Telephone Encounter (Signed)
Its ok but pt needs to bring order with him

## 2014-01-04 NOTE — Telephone Encounter (Signed)
Pt's daughter states that the pt has been seeing dr. Elease Hashimoto for a while and is very comfortable with him, daughter states she understands that he will not accept her as a new pt, however daughter wants to know if she can find her another pcp and give her pt slot to her mother. States her mom is 78 yrs old and she is willing to do this for her because her mom doesn't want to see anyone else but dr. Elease Hashimoto. Daughter is a pt of his and she states she switch if dr. Elease Hashimoto will take her mom.

## 2014-01-04 NOTE — Telephone Encounter (Signed)
Dr. Elease Hashimoto stated that he is not taking any new patients at this time.

## 2014-01-05 ENCOUNTER — Other Ambulatory Visit (INDEPENDENT_AMBULATORY_CARE_PROVIDER_SITE_OTHER): Payer: Medicare Other

## 2014-01-05 DIAGNOSIS — G40209 Localization-related (focal) (partial) symptomatic epilepsy and epileptic syndromes with complex partial seizures, not intractable, without status epilepticus: Secondary | ICD-10-CM

## 2014-01-05 DIAGNOSIS — I6340 Cerebral infarction due to embolism of unspecified cerebral artery: Secondary | ICD-10-CM

## 2014-01-05 LAB — VITAMIN B12: VITAMIN B 12: 267 pg/mL (ref 211–911)

## 2014-01-11 ENCOUNTER — Other Ambulatory Visit: Payer: Self-pay | Admitting: Internal Medicine

## 2014-01-16 NOTE — Telephone Encounter (Signed)
Pt aware dr Elease Hashimoto is not taking new pts. Pt states she has made other arrangements for a dr..

## 2014-01-16 NOTE — Telephone Encounter (Signed)
Pt aware and brought order in

## 2014-01-22 ENCOUNTER — Encounter: Payer: Self-pay | Admitting: Family

## 2014-01-22 ENCOUNTER — Ambulatory Visit (INDEPENDENT_AMBULATORY_CARE_PROVIDER_SITE_OTHER): Payer: Medicare Other | Admitting: Family

## 2014-01-22 ENCOUNTER — Ambulatory Visit: Payer: Medicare Other | Admitting: Internal Medicine

## 2014-01-22 VITALS — BP 120/60 | HR 89 | Temp 97.5°F | Ht 64.0 in | Wt 188.0 lb

## 2014-01-22 DIAGNOSIS — I4891 Unspecified atrial fibrillation: Secondary | ICD-10-CM

## 2014-01-22 DIAGNOSIS — M19042 Primary osteoarthritis, left hand: Secondary | ICD-10-CM

## 2014-01-22 DIAGNOSIS — M19049 Primary osteoarthritis, unspecified hand: Secondary | ICD-10-CM

## 2014-01-22 DIAGNOSIS — M19041 Primary osteoarthritis, right hand: Secondary | ICD-10-CM

## 2014-01-22 MED ORDER — DICLOFENAC SODIUM 1 % TD GEL: 2.0000 g | Freq: Four times a day (QID) | TRANSDERMAL | Status: DC

## 2014-01-22 MED ORDER — PREDNISONE 20 MG PO TABS: ORAL_TABLET | ORAL | Status: AC

## 2014-01-22 NOTE — Progress Notes (Signed)
Pre visit review using our clinic review tool, if applicable. No additional management support is needed unless otherwise documented below in the visit note. 

## 2014-01-22 NOTE — Progress Notes (Signed)
Subjective:    Patient ID: [name], female    DOB: 08-13-1927, 80 y.o.   MRN: [id number]  Hand Pain    80 y.o. White female presents with chief complaint of "pain and swelling in both hands". Pt states that this pain is an ongoing problem. She was given a prescription for tramadol which she states made her nauseous and also for celebrex which pt states did not help. Pt rates the pain as a 10 on a scale of 0-10, it is in bilateral hands to proximal and distal interphalangeal joints, the pain does not radiate, it is worse in the morning and with exercise, warm water soaks and tylenol help the pain "some". Pt denies fever, chills, fatigue and change in appetite.     Review of Systems  Constitutional: Negative for fever, chills, appetite change and fatigue.  HENT: Negative.   Cardiovascular: Negative.   Gastrointestinal: Negative.   Musculoskeletal: Positive for arthralgias.       Rates pain a 10 on a scale of 0-10 to bilateral hands.   Skin: Negative.   Neurological: Negative.   Hematological: Negative.   Psychiatric/Behavioral: Negative.    Past Medical History  Diagnosis Date  . GERD (gastroesophageal reflux disease)   . History of TIAs   . Diverticulosis   . PUD (peptic ulcer disease)   . PVD (peripheral vascular disease)   . Hyperlipidemia   . Anxiety   . Depression   . Blood transfusion     "w/1st miscarriage" (05/26/2013)  . COPD (chronic obstructive pulmonary disease)   . Hypertension   . PONV (postoperative nausea and vomiting)   . Family history of anesthesia complication     "sisters also get sick as a dog" (05/26/2013)  . Coronary artery disease   . Heart murmur     "when I was a child" (05/26/2013)  . DVT (deep venous thrombosis) 1954    "after I had my first child" (05/26/2013)  . Pneumonia 2013    "once" (05/26/2013)  . Exertional shortness of breath   . H/O hiatal hernia   . Stroke 10/2002    no residual  . Stroke 05/26/2013    "very mild;  affected my speech for a while" (05/26/2013)  . Arthritis     "lots; hands" (05/26/2013)  . 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 Lymphona, mantle cell, inguinal region/lower limb     "large c-cell" (05/26/2013)    History   Social History  . Marital Status: Married    Spouse Name: N/A    Number of Children: 6  . Years of Education: N/A   Occupational History  . retired    Social History Main Topics  . Smoking status: Former Smoker -- 2.50 packs/day for 37 years    Types: Cigarettes    Quit date: 09/05/1984  . Smokeless tobacco: Never Used  . Alcohol Use: No  . Drug Use: No  . Sexual Activity: Not Currently   Other Topics Concern  . Not on file   Social History Narrative  . No narrative on file    Past Surgical History  Procedure Laterality Date  . 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 Tubal ligation  1972  . Radical neck dissection  1986    "larynx cancer" (05/26/2013)  . Total knee arthroplasty Bilateral     2002 left 2008 right  . Ileostomy closure  01/2010  . Cataract extraction  2005    bilateral  . Carotid endarterectomy Bilateral 2004    2004 a month apart right and left  . Tonsillectomy and adenoidectomy  1936    "tonsils grew back; no 2nd OR" (05/26/2013)  . Hernia repair  05/2010    ventral hernia repair  . Carpal tunnel release Left ?1960's  . Abdominal exploration surgery  12/01/2012  . Appendectomy  09/13/2008  . Colon surgery  09/13/2008    Laparoscopic assisted converted to open sigmoid colectomy.Archie Endo 09/13/2008 (05/26/2013)  . Cataract extraction w/ intraocular lens  implant, bilateral Bilateral ~ 2003  . Dilation and curettage of uterus  1950's    "had 2 for miscarriages" (05/26/2013)    Family History  Problem Relation Age of Onset  . Colon cancer Sister     colon  . Hyperlipidemia Mother   . Stroke Mother   . Cancer Father      mesothelioma    Allergies  Allergen Reactions  . Ativan [Lorazepam] Other (See Comments)    Severe hallucinations   . Propoxyphene Hcl     Rxn: unknown  . Vancomycin   . Penicillins Rash    Current Outpatient Prescriptions on File Prior to Visit  Medication Sig Dispense Refill  . ALPRAZolam (XANAX) 0.5 MG tablet Take 1 tablet (0.5 mg total) by mouth at bedtime as needed for sleep.  30 tablet  3  . bisoprolol (ZEBETA) 5 MG tablet TAKE ONE TABLET BY MOUTH ONCE DAILY  30 tablet  11  . cholestyramine (QUESTRAN) 4 GM/DOSE powder Take 1 packet (4 g total) by mouth 2 (two) times daily with a meal.  378 g  12  . levETIRAcetam (KEPPRA) 750 MG tablet TAKE ONE TABLET BY MOUTH TWICE DAILY  60 tablet  0  . loperamide (IMODIUM) 2 MG capsule Take 2 mg by mouth daily as needed for diarrhea or loose stools.       . Vitamin D, Ergocalciferol, (DRISDOL) 50000 UNITS CAPS Take 50,000 Units by mouth every Sunday.      Alveda Reasons 15 MG TABS tablet TAKE ONE TABLET BY MOUTH ONCE DAILY AFTER SUPPER  30 tablet  6   No current facility-administered medications on file prior to visit.    BP 120/60  Pulse 89  Temp(Src) 97.5 F (36.4 C) (Oral)  Ht 5\' 4"  (1.626 m)  Wt 188 lb (85.276 kg)  BMI 32.25 kg/m2  SpO2 91%chart    Objective:   Physical Exam  Constitutional: She is oriented to person, place, and time. She appears well-developed and well-nourished. She is active.  Cardiovascular: Normal rate, regular rhythm, normal heart sounds and normal pulses.   Pulmonary/Chest: Effort normal and breath sounds normal.  Abdominal: Soft. Normal appearance and bowel sounds are normal.  Musculoskeletal:  Decreased ROM to bilateral phalanges.   Neurological: She is alert and oriented to person, place, and time.  Skin: Skin is warm and dry. Lesion noted.  Lesion to left first knuckle   Psychiatric: She has a normal mood and affect. Her speech is normal and behavior is normal. Thought content normal.            Assessment & Plan:  Prescilla was seen today for hand pain.  Diagnoses and associated orders for this visit:  Primary osteoarthritis of both hands  Other Orders - predniSONE (DELTASONE) 20 MG tablet; 60mg  PO qam x 3 days, 40mg  po qam x 3 days, 20mg  qam x 3 days - diclofenac sodium (VOLTAREN)  1 % GEL; Apply 2 g topically 4 (four) times daily.   Education: Continue to use heat to hands as needed to help with pain. Perform ROM exercises frequently to bilateral hands.   Follow up: 2 weeks

## 2014-01-22 NOTE — Patient Instructions (Signed)
Osteoarthritis Osteoarthritis is a disease that causes soreness and swelling (inflammation) of a joint. It occurs when the cartilage at the affected joint wears down. Cartilage acts as a cushion, covering the ends of bones where they meet to form a joint. Osteoarthritis is the most common form of arthritis. It often occurs in older people. The joints affected most often by this condition include those in the:  Ends of the fingers.  Thumbs.  Neck.  Lower back.  Knees.  Hips. CAUSES  Over time, the cartilage that covers the ends of bones begins to wear away. This causes bone to rub on bone, producing pain and stiffness in the affected joints.  RISK FACTORS Certain factors can increase your chances of having osteoarthritis, including:  Older age.  Excessive body weight.  Overuse of joints. SIGNS AND SYMPTOMS   Pain, swelling, and stiffness in the joint.  Over time, the joint may lose its normal shape.  Small deposits of bone (osteophytes) may grow on the edges of the joint.  Bits of bone or cartilage can break off and float inside the joint space. This may cause more pain and damage. DIAGNOSIS  Your health care provider will do a physical exam and ask about your symptoms. Various tests may be ordered, such as:  X-rays of the affected joint.  An MRI scan.  Blood tests to rule out other types of arthritis.  Joint fluid tests. This involves using a needle to draw fluid from the joint and examining the fluid under a microscope. TREATMENT  Goals of treatment are to control pain and improve joint function. Treatment plans may include:  A prescribed exercise program that allows for rest and joint relief.  A weight control plan.  Pain relief techniques, such as:  Properly applied heat and cold.  Electric pulses delivered to nerve endings under the skin (transcutaneous electrical nerve stimulation, TENS).  Massage.  Certain nutritional supplements.  Medicines to  control pain, such as:  Acetaminophen.  Nonsteroidal anti-inflammatory drugs (NSAIDs), such as naproxen.  Narcotic or central-acting agents, such as tramadol.  Corticosteroids. These can be given orally or as an injection.  Surgery to reposition the bones and relieve pain (osteotomy) or to remove loose pieces of bone and cartilage. Joint replacement may be needed in advanced states of osteoarthritis. HOME CARE INSTRUCTIONS   Only take over-the-counter or prescription medicines as directed by your health care provider. Take all medicines exactly as instructed.  Maintain a healthy weight. Follow your health care provider's instructions for weight control. This may include dietary instructions.  Exercise as directed. Your health care provider can recommend specific types of exercise. These may include:  Strengthening exercises--These are done to strengthen the muscles that support joints affected by arthritis. They can be performed with weights or with exercise bands to add resistance.  Aerobic activities--These are exercises, such as brisk walking or low-impact aerobics, that get your heart pumping.  Range-of-motion activities--These keep your joints limber.  Balance and agility exercises--These help you maintain daily living skills.  Rest your affected joints as directed by your health care provider.  Follow up with your health care provider as directed. SEEK MEDICAL CARE IF:   Your skin turns red.  You develop a rash in addition to your joint pain.  You have worsening joint pain. SEEK IMMEDIATE MEDICAL CARE IF:  You have a significant loss of weight or appetite.  You have a fever along with joint or muscle aches.  You have night sweats. FOR MORE   INFORMATION  National Institute of Arthritis and Musculoskeletal and Skin Diseases: www.niams.nih.gov National Institute on Aging: www.nia.nih.gov American College of Rheumatology: www.rheumatology.org Document Released:  07/20/2005 Document Revised: 05/10/2013 Document Reviewed: 03/27/2013 [employer] Patient Information 2015 [employer]. This information is not intended to replace advice given to you by your health care provider. Make sure you discuss any questions you have with your health care provider.  

## 2014-01-26 ENCOUNTER — Other Ambulatory Visit: Payer: Self-pay | Admitting: Internal Medicine

## 2014-01-26 ENCOUNTER — Ambulatory Visit: Payer: Medicare Other | Admitting: Physical Therapy

## 2014-02-05 ENCOUNTER — Ambulatory Visit: Payer: Medicare Other | Attending: Neurology | Admitting: Physical Therapy

## 2014-02-05 DIAGNOSIS — R5381 Other malaise: Secondary | ICD-10-CM | POA: Diagnosis not present

## 2014-02-05 DIAGNOSIS — I69998 Other sequelae following unspecified cerebrovascular disease: Secondary | ICD-10-CM | POA: Insufficient documentation

## 2014-02-05 DIAGNOSIS — M6281 Muscle weakness (generalized): Secondary | ICD-10-CM | POA: Diagnosis not present

## 2014-02-05 DIAGNOSIS — I69920 Aphasia following unspecified cerebrovascular disease: Secondary | ICD-10-CM | POA: Insufficient documentation

## 2014-02-05 DIAGNOSIS — R488 Other symbolic dysfunctions: Secondary | ICD-10-CM | POA: Insufficient documentation

## 2014-02-05 DIAGNOSIS — R269 Unspecified abnormalities of gait and mobility: Secondary | ICD-10-CM | POA: Diagnosis not present

## 2014-02-05 DIAGNOSIS — Z5189 Encounter for other specified aftercare: Secondary | ICD-10-CM | POA: Diagnosis present

## 2014-02-05 DIAGNOSIS — G40209 Localization-related (focal) (partial) symptomatic epilepsy and epileptic syndromes with complex partial seizures, not intractable, without status epilepticus: Secondary | ICD-10-CM | POA: Insufficient documentation

## 2014-02-07 ENCOUNTER — Ambulatory Visit (INDEPENDENT_AMBULATORY_CARE_PROVIDER_SITE_OTHER): Payer: Medicare Other | Admitting: Family

## 2014-02-07 ENCOUNTER — Ambulatory Visit: Payer: Medicare Other | Admitting: Physical Therapy

## 2014-02-07 ENCOUNTER — Encounter: Payer: Self-pay | Admitting: Family

## 2014-02-07 VITALS — BP 90/62 | HR 92 | Ht 64.0 in | Wt 188.0 lb

## 2014-02-07 DIAGNOSIS — M1990 Unspecified osteoarthritis, unspecified site: Secondary | ICD-10-CM

## 2014-02-07 DIAGNOSIS — I4891 Unspecified atrial fibrillation: Secondary | ICD-10-CM

## 2014-02-07 DIAGNOSIS — R200 Anesthesia of skin: Secondary | ICD-10-CM

## 2014-02-07 DIAGNOSIS — I482 Chronic atrial fibrillation, unspecified: Secondary | ICD-10-CM

## 2014-02-07 DIAGNOSIS — Z5189 Encounter for other specified aftercare: Secondary | ICD-10-CM | POA: Diagnosis not present

## 2014-02-07 DIAGNOSIS — R209 Unspecified disturbances of skin sensation: Secondary | ICD-10-CM

## 2014-02-07 DIAGNOSIS — H6120 Impacted cerumen, unspecified ear: Secondary | ICD-10-CM

## 2014-02-07 DIAGNOSIS — H6123 Impacted cerumen, bilateral: Secondary | ICD-10-CM

## 2014-02-07 LAB — BASIC METABOLIC PANEL
BUN: 20 mg/dL (ref 6–23)
CHLORIDE: 106 meq/L (ref 96–112)
CO2: 23 meq/L (ref 19–32)
CREATININE: 1.3 mg/dL — AB (ref 0.4–1.2)
Calcium: 9.3 mg/dL (ref 8.4–10.5)
GFR: 43.01 mL/min — ABNORMAL LOW (ref >60.00)
Glucose, Bld: 113 mg/dL — ABNORMAL HIGH (ref 70–99)
POTASSIUM: 4.3 meq/L (ref 3.5–5.1)
Sodium: 140 mEq/L (ref 135–145)

## 2014-02-07 LAB — TSH: TSH: 0.76 u[IU]/mL (ref 0.35–4.50)

## 2014-02-07 NOTE — Progress Notes (Signed)
Subjective:    Patient ID: [name], female    DOB: 10/13/1927, 80 y.o.   MRN: [id number]  HPI 80 year old WF, nonsmoker, with a history of Atrial Fibrillation, Hypertension, Depression, and Osteoarthritis. She was placed on a round of prednisone and Voltaren gel for arthritis. She is much better and has no pain but reports numbness in her fingertips and feet.     Review of Systems  Constitutional: Negative.   HENT: Negative.  Negative for ear discharge and postnasal drip.        Ear clogged  Respiratory: Negative.   Cardiovascular: Negative.   Gastrointestinal: Negative.   Endocrine: Negative.   Musculoskeletal: Negative.  Negative for arthralgias.  Skin: Negative.   Allergic/Immunologic: Negative.   Neurological: Negative.   Psychiatric/Behavioral: Negative.    Past Medical History  Diagnosis Date  . GERD (gastroesophageal reflux disease)   . History of TIAs   . Diverticulosis   . PUD (peptic ulcer disease)   . PVD (peripheral vascular disease)   . Hyperlipidemia   . Anxiety   . Depression   . Blood transfusion     "w/1st miscarriage" (05/26/2013)  . COPD (chronic obstructive pulmonary disease)   . Hypertension   . PONV (postoperative nausea and vomiting)   . Family history of anesthesia complication     "sisters also get sick as a dog" (05/26/2013)  . Coronary artery disease   . Heart murmur     "when I was a child" (05/26/2013)  . DVT (deep venous thrombosis) 1954    "after I had my first child" (05/26/2013)  . Pneumonia 2013    "once" (05/26/2013)  . Exertional shortness of breath   . H/O hiatal hernia   . Stroke 10/2002    no residual  . Stroke 05/26/2013    "very mild; affected my speech for a while" (05/26/2013)  . Arthritis     "lots; hands" (05/26/2013)  . 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 Lymphona, mantle cell, inguinal region/lower limb     "large c-cell" (05/26/2013)    History    Social History  . Marital Status: Married    Spouse Name: N/A    Number of Children: 6  . Years of Education: N/A   Occupational History  . retired    Social History Main Topics  . Smoking status: Former Smoker -- 2.50 packs/day for 37 years    Types: Cigarettes    Quit date: 09/05/1984  . Smokeless tobacco: Never Used  . Alcohol Use: No  . Drug Use: No  . Sexual Activity: Not Currently   Other Topics Concern  . Not on file   Social History Narrative  . No narrative on file    Past Surgical History  Procedure Laterality Date  . 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 Tubal ligation  1972  . Radical neck dissection  1986    "larynx cancer" (05/26/2013)  . Total knee arthroplasty Bilateral     2002 left 2008 right  . Ileostomy closure  01/2010  . Cataract extraction  2005    bilateral  . Carotid endarterectomy Bilateral 2004    2004 a month apart right and left  . Tonsillectomy and adenoidectomy  1936    "tonsils grew back; no 2nd OR" (05/26/2013)  . Hernia repair  05/2010    ventral hernia repair  . Carpal tunnel release  Left ?1960's  . Abdominal exploration surgery  12/01/2012  . Appendectomy  09/13/2008  . Colon surgery  09/13/2008    Laparoscopic assisted converted to open sigmoid colectomy.Archie Endo 09/13/2008 (05/26/2013)  . Cataract extraction w/ intraocular lens  implant, bilateral Bilateral ~ 2003  . Dilation and curettage of uterus  1950's    "had 2 for miscarriages" (05/26/2013)    Family History  Problem Relation Age of Onset  . Colon cancer Sister     colon  . Hyperlipidemia Mother   . Stroke Mother   . Cancer Father     mesothelioma    Allergies  Allergen Reactions  . Ativan [Lorazepam] Other (See Comments)    Severe hallucinations   . Propoxyphene Hcl     Rxn: unknown  . Vancomycin   . Penicillins Rash    Current Outpatient Prescriptions on File Prior to Visit  Medication Sig  Dispense Refill  . ALPRAZolam (XANAX) 0.5 MG tablet Take 1 tablet (0.5 mg total) by mouth at bedtime as needed for sleep.  30 tablet  3  . bisoprolol (ZEBETA) 5 MG tablet TAKE ONE TABLET BY MOUTH ONCE DAILY  30 tablet  11  . cholestyramine (QUESTRAN) 4 GM/DOSE powder Take 1 packet (4 g total) by mouth 2 (two) times daily with a meal.  378 g  12  . diclofenac sodium (VOLTAREN) 1 % GEL Apply 2 g topically 4 (four) times daily.  1 Tube  1  . levETIRAcetam (KEPPRA) 750 MG tablet TAKE ONE TABLET BY MOUTH TWICE DAILY  60 tablet  0  . loperamide (IMODIUM) 2 MG capsule Take 2 mg by mouth daily as needed for diarrhea or loose stools.       . Vitamin D, Ergocalciferol, (DRISDOL) 50000 UNITS CAPS Take 50,000 Units by mouth every Sunday.      Alveda Reasons 15 MG TABS tablet TAKE ONE TABLET BY MOUTH ONCE DAILY AFTER SUPPER  30 tablet  6   No current facility-administered medications on file prior to visit.    BP 90/62  Pulse 92  Ht 5\' 4"  (1.626 m)  Wt 188 lb (85.276 kg)  BMI 32.25 kg/m2chart    Objective:   Physical Exam  Constitutional: She is oriented to person, place, and time. She appears well-developed and well-nourished.  HENT:  Nose: Nose normal.  Mouth/Throat: Oropharynx is clear and moist.  Ears clogged bilaterally.   Neck: Normal range of motion. Neck supple.  Cardiovascular: Normal rate, regular rhythm and normal heart sounds.   Pulmonary/Chest: Effort normal and breath sounds normal.  Abdominal: Soft. Bowel sounds are normal.  Musculoskeletal: Normal range of motion.  Neurological: She is alert and oriented to person, place, and time.  Skin: Skin is warm and dry.  Psychiatric: She has a normal mood and affect.          Assessment & Plan:  [name] was seen today for follow-up.  Diagnoses and associated orders for this visit:  Numbness of fingers of both hands - Basic Metabolic Panel - TSH  Osteoarthritis, unspecified osteoarthritis type, unspecified site - Basic Metabolic  Panel - TSH  Chronic atrial fibrillation - Basic Metabolic Panel  Cerumen impaction, bilateral   Call the office with any questions or concerns. Recheck as scheduled and as needed.

## 2014-02-07 NOTE — Patient Instructions (Signed)
Cerumen Impaction °A cerumen impaction is when the wax in your ear forms a plug. This plug usually causes reduced hearing. Sometimes it also causes an earache or dizziness. Removing a cerumen impaction can be difficult and painful. The wax sticks to the ear canal. The canal is sensitive and bleeds easily. If you try to remove a heavy wax buildup with a cotton tipped swab, you may push it in further. °Irrigation with water, suction, and small ear curettes may be used to clear out the wax. If the impaction is fixed to the skin in the ear canal, ear drops may be needed for a few days to loosen the wax. People who build up a lot of wax frequently can use ear wax removal products available in your local drugstore. °SEEK MEDICAL CARE IF:  °You develop an earache, increased hearing loss, or marked dizziness. °Document Released: 08/27/2004 Document Revised: 10/12/2011 Document Reviewed: 10/17/2009 °ExitCare® Patient Information ©2015 [employer]. This information is not intended to replace advice given to you by your health care provider. Make sure you discuss any questions you have with your health care provider. ° °

## 2014-02-07 NOTE — Progress Notes (Signed)
Pre visit review using our clinic review tool, if applicable. No additional management support is needed unless otherwise documented below in the visit note. 

## 2014-02-13 ENCOUNTER — Ambulatory Visit: Payer: Medicare Other | Admitting: Physical Therapy

## 2014-02-13 DIAGNOSIS — Z5189 Encounter for other specified aftercare: Secondary | ICD-10-CM | POA: Diagnosis not present

## 2014-02-15 ENCOUNTER — Ambulatory Visit: Payer: Medicare Other | Admitting: Physical Therapy

## 2014-02-15 ENCOUNTER — Telehealth: Payer: Self-pay | Admitting: Oncology

## 2014-02-15 ENCOUNTER — Ambulatory Visit (HOSPITAL_BASED_OUTPATIENT_CLINIC_OR_DEPARTMENT_OTHER): Payer: Medicare Other | Admitting: Oncology

## 2014-02-15 VITALS — BP 141/80 | HR 86 | Temp 97.7°F | Resp 18 | Ht 64.0 in | Wt 185.5 lb

## 2014-02-15 DIAGNOSIS — C8510 Unspecified B-cell lymphoma, unspecified site: Secondary | ICD-10-CM

## 2014-02-15 DIAGNOSIS — Z87898 Personal history of other specified conditions: Secondary | ICD-10-CM

## 2014-02-15 NOTE — Progress Notes (Signed)
  [facility] OFFICE PROGRESS NOTE   Diagnosis: Non-Hodgkin's lymphoma  INTERVAL HISTORY:   She returns as scheduled. She feels well. Good appetite. No new complaint. She is participating an outpatient physical therapy.  Objective:  Vital signs in last 24 hours:  Blood pressure 141/80, pulse 86, temperature 97.7 F (36.5 C), temperature source Oral, resp. rate 18, height 5\' 4"  (1.626 m), weight 185 lb 8 oz (84.142 kg).    HEENT: Neck without mass Lymphatics: No cervical, supra-clavicular, axillary, or inguinal nodes Resp: Lungs clear bilaterally, no respiratory distress Cardio: Regular rate and rhythm GI: No hepatosplenomegaly, nontender, no mass Vascular: No leg edema  Medications: I have reviewed the patient's current medications.  Assessment/Plan: 1. Non-Hodgkin's lymphoma, large B-cell lymphoma, CD20 positive involving 8 needle core biopsy of a right iliac lymph node 02/12/2012. CT of the abdomen and pelvis on 01/15/2012 showed abdominal/pelvic lymphadenopathy. Serum LDH in normal range on 02/25/2012. Staging PET scan 02/25/2012 with a small lymph node posteriolateral to the oropharynx on the left measuring 9 x 7 mm with SUV 5.4; large cluster of hypermetabolic lymph nodes in the right hilum with SUV 4.3; cluster of mildly enlarged hepatoduodenal ligament lymph nodes and mildly enlarged portacaval lymph node with hypermetabolic activity (SUV max 37.1); numerous borderline enlarged and mildly enlarged periaortic retroperitoneal lymph nodes with extensive hypermetabolic activity (SUV max 43.6-47.6); numerous borderline enlarged and mildly enlarged hypermetabolic lymph nodes along the pelvic sidewall bilaterally most pronounced on the right with a right external iliac lymph node measuring 2.9 x 4.1 cm (SUV max 52.5). Bone marrow aspiration/biopsy 03/09/2012 negative for involvement with lymphoma. Status post cycle 1 of CHOP-Rituxan on 03/15/2012. She completed cycle 3 on  05/04/2012 and cycle 4 on 05/25/2012. Restaging PET scan on 06/10/2012 was markedly improved with resolution of the thoracic and retroperitoneal adenopathy and near complete resolution of porta hepatis adenopathy. She completed cycle 6 of CHOP/Rituxan on 07/06/2012. 2. History of night sweats likely secondary to #1. 3. Rectal bleeding June 2013 status post negative colonoscopy 01/28/2012 and upper endoscopy 02/11/2012. The bleeding was likely related to hemorrhoids. 4. COPD. 5. Remote history of larynx cancer. 6. Status post Port-A-Cath placement 03/09/2012. Removed 09/13/2012. 7. 2-D echo 03/11/2012 with left ventricular ejection fraction 60-65%. 8. Admission with febrile neutropenia 03/22/2012 with no source for infection identified 9. Admission with fever and a chest x-ray consistent with "pneumonia"on 03/27/2012. She completed a course of Levaquin. 10. Pancytopenia following cycle 1 of CHOP-rituximab-resolved. 11. History of atrial fibrillation-maintained on xarelto 12. Numbness/tingling in the fingertips and toes. The numbness in the toes predated the start of chemotherapy.. She also reports the tingling in the fingertips was present prior to the start of chemotherapy. The vincristine was dose reduced with cycle 2. Vincristine was discontinued beginning with cycle 3. 13. Cardioembolic strokes, seizures 2014. She is followed by neurology. 32. Hospitalization with pneumonia November 2014.   Disposition:  [name] remains in clinical remission from non-Hodgkin's lymphoma. She will return for an office visit in 6 months.  Betsy Coder, MD  02/15/2014  4:10 PM

## 2014-02-15 NOTE — Telephone Encounter (Signed)
gv adn rpinted appt sched and avs for pt for Jan 2016

## 2014-02-19 NOTE — Progress Notes (Signed)
   Subjective:    Patient ID: [name], female    DOB: July 21, 1928, 80 y.o.   MRN: [id number]  HPI    Review of Systems     Objective:   Physical Exam    Informed consent was obtained and peroxide gel was inserted into the ears bilaterally using the lavage kit the ears were lavaged until clean.Inspection with a cerumen spoon removed residual wax. Patient tolerated the procedure well.     Assessment & Plan:

## 2014-02-20 ENCOUNTER — Ambulatory Visit: Payer: Medicare Other

## 2014-02-20 ENCOUNTER — Ambulatory Visit: Payer: Medicare Other | Admitting: Physical Therapy

## 2014-02-20 DIAGNOSIS — Z5189 Encounter for other specified aftercare: Secondary | ICD-10-CM | POA: Diagnosis not present

## 2014-02-22 ENCOUNTER — Ambulatory Visit: Payer: Medicare Other | Admitting: Speech Pathology

## 2014-02-22 ENCOUNTER — Ambulatory Visit: Payer: Medicare Other | Admitting: Physical Therapy

## 2014-02-22 DIAGNOSIS — Z5189 Encounter for other specified aftercare: Secondary | ICD-10-CM | POA: Diagnosis not present

## 2014-02-26 ENCOUNTER — Ambulatory Visit (INDEPENDENT_AMBULATORY_CARE_PROVIDER_SITE_OTHER)
Admission: RE | Admit: 2014-02-26 | Discharge: 2014-02-26 | Disposition: A | Discharge status: Home / Self Care | Payer: Medicare Other | Source: Ambulatory Visit | Attending: Family Medicine | Admitting: Family Medicine

## 2014-02-26 ENCOUNTER — Encounter: Payer: Self-pay | Admitting: Family Medicine

## 2014-02-26 ENCOUNTER — Other Ambulatory Visit: Payer: Self-pay | Admitting: *Deleted

## 2014-02-26 ENCOUNTER — Telehealth: Payer: Self-pay | Admitting: Neurology

## 2014-02-26 ENCOUNTER — Ambulatory Visit (INDEPENDENT_AMBULATORY_CARE_PROVIDER_SITE_OTHER): Payer: Medicare Other | Admitting: Family Medicine

## 2014-02-26 VITALS — BP 140/80 | HR 80 | Temp 98.1°F | Wt 187.0 lb

## 2014-02-26 DIAGNOSIS — M533 Sacrococcygeal disorders, not elsewhere classified: Secondary | ICD-10-CM

## 2014-02-26 DIAGNOSIS — M5430 Sciatica, unspecified side: Secondary | ICD-10-CM

## 2014-02-26 DIAGNOSIS — M5440 Lumbago with sciatica, unspecified side: Secondary | ICD-10-CM

## 2014-02-26 MED ORDER — LEVETIRACETAM 750 MG PO TABS: 750.0000 mg | ORAL_TABLET | Freq: Two times a day (BID) | ORAL | Status: DC

## 2014-02-26 NOTE — Telephone Encounter (Signed)
Pt called stating that she is having a hard time getting a refill for Levetiracetam 750mg  Pharmacy: [facility] on [address]  [phone]

## 2014-02-26 NOTE — Telephone Encounter (Signed)
RX has been called to pharmacy on [address] with refills

## 2014-02-26 NOTE — Progress Notes (Signed)
Lisa Reddish, MD Phone: [phone]  Subjective:   [name] is a 80 y.o. year old very pleasant female patient who presents with the following:  Low Back Pain L>R with sciatica 3 weeks ago Slid out of bed one night trying to get up. Very high mattress and slid onto buttocks. History double knee replacement so hard to get up from injury. Had some low level pain at time of injury. Husband helped her up. 2 weeks ago pain worsened in low back. Pain in low back radiating into both legs. No numbness/tingling in her legs. 8/10 pain feels like tight muscle. Never had pain like this before but has had some low back pain. Pain worse with walking. In therapy for right knee currently to regain words and right leg strength but no changes in leg strength since fall (no decrease)  Previous imaging-none  ROS-No saddle anesthesia, bladder incontinence, fecal incontinence, weakness in extremity (other than weakness from stroke), numbness or tingling in extremity. History negative for history of cancer, fever, chills, unintentional weight loss, recent IV drug use, HIV, pain worse at night or while supine. Cancer of pharynx many years ago and lymphoma 1.5 years ago. Had UTI 2 months ago  Past Medical History- osteoarthritis, history CVA, a fib, depression, PVD, GERD, HLD, HTN  Medications- reviewed and updated Current Outpatient Prescriptions  Medication Sig Dispense Refill  . bisoprolol (ZEBETA) 5 MG tablet TAKE ONE TABLET BY MOUTH ONCE DAILY  30 tablet  11  . levETIRAcetam (KEPPRA) 750 MG tablet TAKE ONE TABLET BY MOUTH TWICE DAILY  60 tablet  0  . XARELTO 15 MG TABS tablet TAKE ONE TABLET BY MOUTH ONCE DAILY AFTER SUPPER  30 tablet  6  . ALPRAZolam (XANAX) 0.5 MG tablet Take 1 tablet (0.5 mg total) by mouth at bedtime as needed for sleep.  30 tablet  3  . diclofenac sodium (VOLTAREN) 1 % GEL Apply 2 g topically 4 (four) times daily.  1 Tube  1  . loperamide (IMODIUM) 2 MG capsule Take 2 mg by  mouth daily as needed for diarrhea or loose stools.       . Vitamin D, Ergocalciferol, (DRISDOL) 50000 UNITS CAPS Take 50,000 Units by mouth every Sunday.       No current facility-administered medications for this visit.    Objective: BP 140/80  Pulse 80  Temp(Src) 98.1 F (36.7 C)  Wt 187 lb (84.823 kg) Gen: NAD, resting comfortably in chair, moves slowly to table CV: RRR no murmurs rubs or gallops Lungs: CTAB no crackles, wheeze, rhonchi Back - Normal skin, Spine with normal alignment and no deformity.  No tenderness to vertebral process palpation.  Paraspinous muscles are not tender and without spasm.   Range of motion is full at neck and lumbar sacral regions. Negative Straight leg raise. Patient does have pain over SI joint and pain with FABER that radiates down the leg. Pain L >R.  Neuro- no saddle anesthesia, 4/5 strength right lower extremity compared to 5/5 on left (baseline since CVA), 1+ reflexes Skin: warm, dry, no rash   Dg Lumbar Spine Complete  02/26/2014   CLINICAL DATA:  [place] 3 weeks ago with bilateral sciatica  EXAM: LUMBAR SPINE - COMPLETE 4+ VIEW  COMPARISON:  None.  FINDINGS: Minimal anterior listhesis of L4 on L5 which appears to be due to degenerative facet change. No fracture. Mild L3-4 and L4-5 degenerative disc disease. Extensive calcification of the aorta.  IMPRESSION: Degenerative changes with no acute findings  Electronically Signed   By: Skipper Cliche M.D.   On: 02/26/2014 16:47   Dg Hip Bilateral W/pelvis  02/26/2014   CLINICAL DATA:  Fall.  EXAM: BILATERAL HIP WITH PELVIS - 4+ VIEW  COMPARISON:  None.  FINDINGS: Degenerative changes lumbar spine and both hips. Aortoiliac atherosclerotic vascular disease. No evidence of fracture or dislocation.  IMPRESSION: 1. No acute abnormality. Degenerative changes lumbar spine and both hips.  2. Peripheral vascular disease.   Electronically Signed   By: Marcello Moores  Register   On: 02/26/2014 16:49   Assessment/Plan:  Low  Back Pain with radiation to legs Suspect this is due to SI joint dysfunction given pain over SI joint and pain with Corky Sox much greater than any other pain I could produce. Patient would like to avoid nsaids so opted for tylenol scheduled and she will discuss SI joint exercises with her physical therapist. Sent for x-ray to rule out fracture given history of fall and these were negative both of hip and lumbar spine for fracture/compression fracture. If no improvement with PT and conservative measures, patient to return. We discussed this may take 6 weeks or longer to feel better.

## 2014-02-26 NOTE — Patient Instructions (Signed)
I suspect you are dealing with SI joint pain. Given your slide down the bed, I need to rule out fracture. Please go to the office and get the x-rays. We will call with results. For pain, i would take tylenol 650mg  every 6-8 hours scheduled for next 5 days. Also, ice area 2-3 x a day for next 3 days then use heat.   Next appointment talk to your PCP about: Health Maintenance Due  Topic Date Due  . Zostavax  03/10/1990

## 2014-02-27 ENCOUNTER — Ambulatory Visit: Payer: Medicare Other | Admitting: Physical Therapy

## 2014-02-27 ENCOUNTER — Encounter: Payer: Medicare Other | Admitting: Speech Pathology

## 2014-03-01 ENCOUNTER — Ambulatory Visit: Payer: Medicare Other | Admitting: Physical Therapy

## 2014-03-01 ENCOUNTER — Ambulatory Visit: Payer: Medicare Other | Admitting: Speech Pathology

## 2014-03-01 DIAGNOSIS — Z5189 Encounter for other specified aftercare: Secondary | ICD-10-CM | POA: Diagnosis not present

## 2014-03-05 ENCOUNTER — Ambulatory Visit: Payer: Medicare Other | Attending: Neurology | Admitting: Physical Therapy

## 2014-03-05 DIAGNOSIS — R5381 Other malaise: Secondary | ICD-10-CM | POA: Insufficient documentation

## 2014-03-05 DIAGNOSIS — R488 Other symbolic dysfunctions: Secondary | ICD-10-CM | POA: Insufficient documentation

## 2014-03-05 DIAGNOSIS — I69998 Other sequelae following unspecified cerebrovascular disease: Secondary | ICD-10-CM | POA: Diagnosis not present

## 2014-03-05 DIAGNOSIS — M6281 Muscle weakness (generalized): Secondary | ICD-10-CM | POA: Diagnosis not present

## 2014-03-05 DIAGNOSIS — G40209 Localization-related (focal) (partial) symptomatic epilepsy and epileptic syndromes with complex partial seizures, not intractable, without status epilepticus: Secondary | ICD-10-CM | POA: Insufficient documentation

## 2014-03-05 DIAGNOSIS — Z5189 Encounter for other specified aftercare: Secondary | ICD-10-CM | POA: Diagnosis present

## 2014-03-05 DIAGNOSIS — R269 Unspecified abnormalities of gait and mobility: Secondary | ICD-10-CM | POA: Insufficient documentation

## 2014-03-05 DIAGNOSIS — I69920 Aphasia following unspecified cerebrovascular disease: Secondary | ICD-10-CM | POA: Insufficient documentation

## 2014-03-12 ENCOUNTER — Ambulatory Visit: Payer: Medicare Other | Admitting: Physical Therapy

## 2014-03-12 ENCOUNTER — Ambulatory Visit: Payer: Medicare Other

## 2014-03-12 DIAGNOSIS — Z5189 Encounter for other specified aftercare: Secondary | ICD-10-CM | POA: Diagnosis not present

## 2014-03-14 ENCOUNTER — Ambulatory Visit: Payer: Medicare Other | Admitting: Physical Therapy

## 2014-03-14 DIAGNOSIS — Z5189 Encounter for other specified aftercare: Secondary | ICD-10-CM | POA: Diagnosis not present

## 2014-03-19 ENCOUNTER — Ambulatory Visit: Payer: Medicare Other | Admitting: Physical Therapy

## 2014-03-19 DIAGNOSIS — Z5189 Encounter for other specified aftercare: Secondary | ICD-10-CM | POA: Diagnosis not present

## 2014-03-20 ENCOUNTER — Other Ambulatory Visit: Payer: Self-pay | Admitting: Internal Medicine

## 2014-03-21 ENCOUNTER — Ambulatory Visit: Payer: Medicare Other | Admitting: Physical Therapy

## 2014-03-21 DIAGNOSIS — Z5189 Encounter for other specified aftercare: Secondary | ICD-10-CM | POA: Diagnosis not present

## 2014-03-26 ENCOUNTER — Ambulatory Visit: Payer: Medicare Other | Admitting: Physical Therapy

## 2014-03-26 DIAGNOSIS — Z5189 Encounter for other specified aftercare: Secondary | ICD-10-CM | POA: Diagnosis not present

## 2014-03-28 ENCOUNTER — Ambulatory Visit: Payer: Medicare Other | Admitting: Physical Therapy

## 2014-03-28 DIAGNOSIS — Z5189 Encounter for other specified aftercare: Secondary | ICD-10-CM | POA: Diagnosis not present

## 2014-04-02 ENCOUNTER — Ambulatory Visit: Payer: Medicare Other | Admitting: Physical Therapy

## 2014-04-02 DIAGNOSIS — Z5189 Encounter for other specified aftercare: Secondary | ICD-10-CM | POA: Diagnosis not present

## 2014-04-04 ENCOUNTER — Ambulatory Visit: Payer: Medicare Other | Attending: Neurology | Admitting: Physical Therapy

## 2014-04-04 ENCOUNTER — Ambulatory Visit: Payer: Medicare Other | Admitting: Physical Therapy

## 2014-04-04 DIAGNOSIS — R488 Other symbolic dysfunctions: Secondary | ICD-10-CM | POA: Insufficient documentation

## 2014-04-04 DIAGNOSIS — I69920 Aphasia following unspecified cerebrovascular disease: Secondary | ICD-10-CM | POA: Diagnosis not present

## 2014-04-04 DIAGNOSIS — Z5189 Encounter for other specified aftercare: Secondary | ICD-10-CM | POA: Insufficient documentation

## 2014-04-04 DIAGNOSIS — I69998 Other sequelae following unspecified cerebrovascular disease: Secondary | ICD-10-CM | POA: Diagnosis not present

## 2014-04-04 DIAGNOSIS — G40209 Localization-related (focal) (partial) symptomatic epilepsy and epileptic syndromes with complex partial seizures, not intractable, without status epilepticus: Secondary | ICD-10-CM | POA: Diagnosis not present

## 2014-04-04 DIAGNOSIS — R5381 Other malaise: Secondary | ICD-10-CM | POA: Diagnosis not present

## 2014-04-04 DIAGNOSIS — M6281 Muscle weakness (generalized): Secondary | ICD-10-CM | POA: Diagnosis not present

## 2014-04-04 DIAGNOSIS — R269 Unspecified abnormalities of gait and mobility: Secondary | ICD-10-CM | POA: Insufficient documentation

## 2014-04-06 ENCOUNTER — Ambulatory Visit (INDEPENDENT_AMBULATORY_CARE_PROVIDER_SITE_OTHER): Payer: Medicare Other | Admitting: Family Medicine

## 2014-04-06 ENCOUNTER — Encounter: Payer: Self-pay | Admitting: Family Medicine

## 2014-04-06 VITALS — BP 140/86 | HR 80 | Temp 98.2°F | Wt 186.0 lb

## 2014-04-06 DIAGNOSIS — M5441 Lumbago with sciatica, right side: Secondary | ICD-10-CM

## 2014-04-06 DIAGNOSIS — M5442 Lumbago with sciatica, left side: Principal | ICD-10-CM

## 2014-04-06 DIAGNOSIS — M5440 Lumbago with sciatica, unspecified side: Secondary | ICD-10-CM | POA: Insufficient documentation

## 2014-04-06 DIAGNOSIS — M5430 Sciatica, unspecified side: Secondary | ICD-10-CM

## 2014-04-06 NOTE — Patient Instructions (Signed)
Likely SI joint pain.   For pain, i would take tylenol 650mg  every 6-8 hours scheduled for next 7 days. Also, ice area 2-3 x a day for next 3 days then use heat. Let's get you into physical therapy. See [place] back in 3-4 weeks unless symptoms worsen, you start to urinate or stool on yourself, or you have weakness not related to pain

## 2014-04-06 NOTE — Progress Notes (Signed)
[name], MD Phone: [phone]  Subjective:   [name] is a 80 y.o. year old very pleasant female patient who presents with the following:  Low Back Pain L>R with sciatica. Thought to be SI joint dysfunction  Formal PT was for right knee and they did not do it for low back. She was working on help with balance but this has not helped. Pain worse with standing or walking. Pain is so severe that she feels that the legs are giving out. Pain relieved by sitting down. At times position changes can make pain worse.   Pain now for total 8 weeks. Patient never took scheduled tylenol and states she was given some exercises for the back but was not faithful in them. She is trying to get extension with PT to continue to focus on low back. B12 3 months ago was fine. She has a history of low b12 as well as neuropathy in bilateral bottom of feet. Neuropathy is not worse at this time. She has no physical weakness but the pain makes her want to sit. No worsening numbness/tingling in her legs.  Pain at last visit thought to be SI joint pain-patient continues to point to SI joint as source of pain.   Previous imaging- Dg Lumbar Spine Complete  02/26/2014 CLINICAL DATA: [place] 3 weeks ago with bilateral sciatica EXAM: LUMBAR SPINE - COMPLETE 4+ VIEW COMPARISON: None. FINDINGS: Minimal anterior listhesis of L4 on L5 which appears to be due to degenerative facet change. No fracture. Mild L3-4 and L4-5 degenerative disc disease. Extensive calcification of the aorta. IMPRESSION: Degenerative changes with no acute findings Electronically Signed By: Skipper Cliche M.D. On: 02/26/2014 16:47  Dg Hip Bilateral W/pelvis  02/26/2014 CLINICAL DATA: Fall. EXAM: BILATERAL HIP WITH PELVIS - 4+ VIEW COMPARISON: None. FINDINGS: Degenerative changes lumbar spine and both hips. Aortoiliac atherosclerotic vascular disease. No evidence of fracture or dislocation. IMPRESSION: 1. No acute abnormality. Degenerative changes  lumbar spine and both hips. 2. Peripheral vascular disease. Electronically Signed By: Marcello Moores Register On: 02/26/2014 16:49   ROS-No saddle anesthesia, bladder incontinence, fecal incontinence, weakness in extremity (other than weakness from stroke/from pain), numbness or tingling in extremity other than several year history of tingling in bottom of feet. History negative for history of cancer, fever, chills, unintentional weight loss, recent IV drug use, HIV, pain worse at night or while supine. Cancer of pharynx many years ago and lymphoma 1.5 years ago. Had UTI 2 months ago  Past Medical History- osteoarthritis, history CVA, a fib, depression, PVD, GERD, HLD, HTN  Medications- reviewed and updated Current Outpatient Prescriptions  Medication Sig Dispense Refill  . ALPRAZolam (XANAX) 0.5 MG tablet TAKE ONE TABLET BY MOUTH AT BEDTIME AS NEEDED FOR SLEEP  30 tablet  5  . bisoprolol (ZEBETA) 5 MG tablet TAKE ONE TABLET BY MOUTH ONCE DAILY  30 tablet  11  . diclofenac sodium (VOLTAREN) 1 % GEL Apply 2 g topically 4 (four) times daily.  1 Tube  1  . levETIRAcetam (KEPPRA) 750 MG tablet TAKE ONE TABLET BY MOUTH TWICE DAILY  60 tablet  0  . loperamide (IMODIUM) 2 MG capsule Take 2 mg by mouth daily as needed for diarrhea or loose stools.       . Vitamin D, Ergocalciferol, (DRISDOL) 50000 UNITS CAPS Take 50,000 Units by mouth every Sunday.      Alveda Reasons 15 MG TABS tablet TAKE ONE TABLET BY MOUTH ONCE DAILY AFTER SUPPER  30 tablet  6  No current facility-administered medications for this visit.    Objective: BP 140/86  Pulse 80  Temp(Src) 98.2 F (36.8 C)  Wt 186 lb (84.369 kg) Gen: NAD, resting comfortably in chair, moves slowly to table but able to get onto table without assist CV: RRR no murmurs rubs or gallops Lungs: CTAB no crackles, wheeze, rhonchi Back - Normal skin, Spine with normal alignment and no deformity.  No tenderness to vertebral process palpation.  Paraspinous muscles are not  tender and without spasm.   Range of motion is full at neck and lumbar sacral regions. Negative Straight leg raise. Patient does have pain over SI joint, but FABER no longer with clear radiation-pain only noted in knee with test even when avoiding pressing directly on knee. Pain L >R.  Neuro- no saddle anesthesia, 4/5 strength right lower extremity compared to 5/5 on left (baseline since CVA), 1+ reflexes Skin: warm, dry, no rash   Assessment/Plan:  Low Back Pain with radiation to legs Continue to suspect SI joint dysfunction. Patient did not use regular icing or tylenol after last visit. I have reinforced these to her and sent in referral to PT for formal therapy. If she were to have persistent pain with 6 weeks of above planned therapy, would consider further imaging vs. Referral to sports medicine for decision on therapies/imaging (already with x-rays). Follow up 3 weeks to assess progress.

## 2014-04-11 ENCOUNTER — Ambulatory Visit: Payer: Medicare Other | Admitting: Physical Therapy

## 2014-04-11 DIAGNOSIS — Z5189 Encounter for other specified aftercare: Secondary | ICD-10-CM | POA: Diagnosis not present

## 2014-04-13 ENCOUNTER — Ambulatory Visit: Payer: Medicare Other | Admitting: Physical Therapy

## 2014-04-13 DIAGNOSIS — Z5189 Encounter for other specified aftercare: Secondary | ICD-10-CM | POA: Diagnosis not present

## 2014-04-16 ENCOUNTER — Ambulatory Visit: Payer: Medicare Other | Admitting: Physical Therapy

## 2014-04-16 DIAGNOSIS — Z5189 Encounter for other specified aftercare: Secondary | ICD-10-CM | POA: Diagnosis not present

## 2014-04-18 ENCOUNTER — Ambulatory Visit: Payer: Medicare Other | Admitting: Physical Therapy

## 2014-04-18 DIAGNOSIS — Z5189 Encounter for other specified aftercare: Secondary | ICD-10-CM | POA: Diagnosis not present

## 2014-04-19 ENCOUNTER — Telehealth: Payer: Self-pay | Admitting: *Deleted

## 2014-04-19 NOTE — Telephone Encounter (Signed)
called stating patient has had episode of hand numbness B/P 160/95 aggitated and loss of words unable to get words out . was advised to take to ed

## 2014-04-23 ENCOUNTER — Other Ambulatory Visit: Payer: Self-pay | Admitting: Internal Medicine

## 2014-04-23 ENCOUNTER — Ambulatory Visit: Payer: Medicare Other | Admitting: Physical Therapy

## 2014-04-24 ENCOUNTER — Encounter: Payer: Self-pay | Admitting: Family Medicine

## 2014-04-24 ENCOUNTER — Ambulatory Visit (INDEPENDENT_AMBULATORY_CARE_PROVIDER_SITE_OTHER): Payer: Medicare Other | Admitting: Family Medicine

## 2014-04-24 VITALS — BP 110/72 | HR 80 | Temp 97.7°F | Wt 185.0 lb

## 2014-04-24 DIAGNOSIS — M5441 Lumbago with sciatica, right side: Secondary | ICD-10-CM

## 2014-04-24 DIAGNOSIS — M5442 Lumbago with sciatica, left side: Principal | ICD-10-CM

## 2014-04-24 DIAGNOSIS — M5430 Sciatica, unspecified side: Secondary | ICD-10-CM

## 2014-04-24 DIAGNOSIS — Z23 Encounter for immunization: Secondary | ICD-10-CM

## 2014-04-24 NOTE — Patient Instructions (Signed)
Sorry you are not better. Since this is the 3rd visit and we have not gotten you better, I want to get some help and refer you to Dr. Charlann Boxer of Sports medicine.   Hold off on PT for now since it seemed to make things worse. Dr. Tamala Julian may tweak your regimen so don't cancel yet.   Please schedule an establish visit with me so we can go over your past history more thoroughly

## 2014-04-24 NOTE — Progress Notes (Signed)
[name], MD Phone: [phone]  Subjective:   [name] is a 80 y.o. year old very pleasant female patient who presents with the following:  Low Back Pain L>R with sciatica. Thought to be SI joint dysfunction  [facility] went to [facility] for at least 3 sessions and states after every session pain was worse and even the next day did not feel better and did not want to get out of bed. She has tried scheduled tylenol. Icing she could not tolerate since it was too cold in her mind but never tried it. Pain worse with standing or walking (cant satand more than a minute a too). No pain with sitting or laying down (unless sits for more than an hour)  Pain now for total 10 weeks. She has a history of low b12 as well as neuropathy in bilateral bottom of feet. Neuropathy is not worse at this time. She has no physical weakness but the pain makes her want to sit. No worsening numbness/tingling in her legs.  Previous imaging- Dg Lumbar Spine Complete  02/26/2014 CLINICAL DATA: [place] 3 weeks ago with bilateral sciatica EXAM: LUMBAR SPINE - COMPLETE 4+ VIEW COMPARISON: None. FINDINGS: Minimal anterior listhesis of L4 on L5 which appears to be due to degenerative facet change. No fracture. Mild L3-4 and L4-5 degenerative disc disease. Extensive calcification of the aorta. IMPRESSION: Degenerative changes with no acute findings Electronically Signed By: Skipper Cliche M.D. On: 02/26/2014 16:47  Dg Hip Bilateral W/pelvis  02/26/2014 CLINICAL DATA: Fall. EXAM: BILATERAL HIP WITH PELVIS - 4+ VIEW COMPARISON: None. FINDINGS: Degenerative changes lumbar spine and both hips. Aortoiliac atherosclerotic vascular disease. No evidence of fracture or dislocation. IMPRESSION: 1. No acute abnormality. Degenerative changes lumbar spine and both hips. 2. Peripheral vascular disease. Electronically Signed By: Marcello Moores Register On: 02/26/2014 16:49   ROS-No saddle anesthesia, bladder incontinence, fecal  incontinence, weakness in extremity (other than weakness from stroke/from pain), numbness or tingling in extremity other than several year history of tingling in bottom of feet. History negative for history of cancer, fever, chills, unintentional weight loss, recent IV drug use, HIV, pain worse at night or while supine. Cancer of pharynx many years ago and lymphoma 1.5 years ago. Had UTI 2 months ago  Past Medical History- osteoarthritis, history CVA, a fib, depression, PVD, GERD, HLD, HTN  Medications- reviewed and updated Current Outpatient Prescriptions  Medication Sig Dispense Refill  . ALPRAZolam (XANAX) 0.5 MG tablet TAKE ONE TABLET BY MOUTH AT BEDTIME AS NEEDED FOR SLEEP  30 tablet  5  . bisoprolol (ZEBETA) 5 MG tablet TAKE ONE TABLET BY MOUTH ONCE DAILY  30 tablet  11  . diclofenac sodium (VOLTAREN) 1 % GEL Apply 2 g topically 4 (four) times daily.  1 Tube  1  . levETIRAcetam (KEPPRA) 750 MG tablet TAKE ONE TABLET BY MOUTH TWICE DAILY  60 tablet  0  . loperamide (IMODIUM) 2 MG capsule Take 2 mg by mouth daily as needed for diarrhea or loose stools.       . Vitamin D, Ergocalciferol, (DRISDOL) 50000 UNITS CAPS Take 50,000 Units by mouth every Sunday.      Alveda Reasons 15 MG TABS tablet TAKE ONE TABLET BY MOUTH ONCE DAILY AFTER  SUPPER  30 tablet  1   No current facility-administered medications for this visit.    Objective: BP 110/72  Pulse 80  Temp(Src) 97.7 F (36.5 C)  Wt 185 lb (83.915 kg) Gen: NAD, resting comfortably in  chair, moves slowly to table but able to get onto table without assist CV: RRR no murmurs rubs or gallops Lungs: CTAB no crackles, wheeze, rhonchi Back - antalgic gait. No pain over SI joint at this time.  Neuro- no saddle anesthesia, 4/5 strength right lower extremity compared to 5/5 on left (baseline since CVA), 1+ reflexes Skin: warm, dry, no rash   Assessment/Plan:  Low Back Pain with radiation to legs No improvement despite PT, scheduled tylenol and in  fact seems to be worsening. Initial x-rays without changes other than degenerative disc disease. Will refer to Dr. Charlann Boxer for further evaluation/management.

## 2014-04-25 ENCOUNTER — Ambulatory Visit (INDEPENDENT_AMBULATORY_CARE_PROVIDER_SITE_OTHER): Payer: Medicare Other | Admitting: Neurology

## 2014-04-25 ENCOUNTER — Encounter: Payer: Self-pay | Admitting: Neurology

## 2014-04-25 ENCOUNTER — Ambulatory Visit: Payer: Medicare Other | Admitting: Physical Therapy

## 2014-04-25 VITALS — BP 110/64 | HR 78 | Ht 64.0 in | Wt 185.4 lb

## 2014-04-25 DIAGNOSIS — M5441 Lumbago with sciatica, right side: Secondary | ICD-10-CM

## 2014-04-25 DIAGNOSIS — M5442 Lumbago with sciatica, left side: Principal | ICD-10-CM

## 2014-04-25 DIAGNOSIS — M5430 Sciatica, unspecified side: Secondary | ICD-10-CM

## 2014-04-25 DIAGNOSIS — Z8673 Personal history of transient ischemic attack (TIA), and cerebral infarction without residual deficits: Secondary | ICD-10-CM

## 2014-04-25 DIAGNOSIS — R569 Unspecified convulsions: Secondary | ICD-10-CM

## 2014-04-25 MED ORDER — GABAPENTIN 300 MG PO CAPS: ORAL_CAPSULE | ORAL | Status: DC

## 2014-04-25 NOTE — Progress Notes (Signed)
NEUROLOGY FOLLOW UP OFFICE NOTE  [name] [id number]  HISTORY OF PRESENT ILLNESS: [name] is an 80 year old right-handed woman with history of hypertension, atrial fibrillation, peripheral vascular disease, anxiety, depression, B Cell lymphoma status post chemotherapy, and COPD who follows up for cardio-embolic stroke and seizure secondary to stroke.  She is accompanied by her husband and her daughter.  Records and images were personally reviewed.    UPDATE: About 3 months ago, she had sudden onset of bilateral lower back pain radiating down the legs, causing great difficulty walking and with balance.  She describes severe shooting pain radiating from the hips down the posterior and lateral thighs up to the knees.  It is most pronounced while walking and appears to be worse after having been in a sedentary position for a prolonged period of time.  She can usually stand up okay.  When she is laying down, she usually feels fine.  She had XRays of the lumbar spine, hips and pelvis in July, which revealed some degenerative changes in the lumbar spine and hips.  She has been getting PT, which has not helped.  On 04/19/14, she had an episode of right hand numbness and inability to get words out.  Her blood pressure was reportedly 160/95.  It lasted for about 2 minutes.  It should be known that she has had these episodes since childhood.  They present with difficulty getting words out, in association with numbness of the hands (both or either one).  Sometimes it was associated with headache.  She had them about twice a year until young adulthood, when they resolved.  Last fall, she had several severe episodes and was started on Keppra at that time, for possible seizures.  This was the first episode all year.  HISTORY: She presented to the ED on 05/24/13 with confusion.  She had a negative head CT and was discharged.  She returned to the ED on 05/26/13 with aphasia.  An MRI of the brain  was performed, which revealed an acute lacunar infarct in the left anterior corona radiata and lentiform nucleus.  CTA of the head revealed atherosclerotic changes in the cavernous carotid arteries without significant stenosis.  CTA of neck revealed focal 50% stenosis of mid right ICA.  2D Echo revealed 50-55% EF with no source of embolus.  Hgb A1c 5.9, LDL 97.  She was switched from [name] to Eliquis. M She was readmitted to the hospital on 05/31/13 with recurrent aphasia.  She had trouble getting her words out and then couldn't speak at all.  Repeat MRI of brain revealed new punctate infarct in the left posterior corona radiata, the same location as previous infarct. No changes in management were made.  She was readmitted to the hospital on 06/07/13 for altered mental status.  During the night, her husband found her standing naked in the closet putting on a summer dress with a sweater hanging out of one arm.  She was amnestic to this event.  When she woke up early in the morning, she exhibited again expressive aphasia, gait imbalance and nausea.  MRI of brain revealed pseudonormalization on DWI of the more acute punctate infarct involving the posterior left corona radiata as well as typical evolution of the older subacute infarct involving the left lentiform nucleus and anterior corona radiata.  EEG was performed, which revealed intermittent infrequent left fronto-parieto-temporal theta slowing.  Due to possibility of seizure, she was started on Keppra 250mg  BID.  No change made  to Eliquis.  She presented to the ED on 06/16/13 after waking up in the morning with expressive aphasia and right-sided weakness.  Symptoms improved in the ED.  CT head revealed prior lacunar infarct in left lentiform nucleus and corona radiata, but no new abnormalities.  It was suspected that she may have had another seizure and her Keppra was increased to 500mg  BID.  She was admitted to the hospital again on 06/21/13 again for  expressive aphasia.  MRI brain revealed 2 new punctate infarcts in the bilateral hemispheres at level of centrum semiovale.  MRA of head was stable.  Carotid dopplers revealed right 1-39% ICA stenosis and left 20-49% ICA stenosis.  2D echo stable.  Eliquis was changed to Xarelto. Keppra was increased to 750mg  BID.  A statin was added, as well.  She developed lethargy, so her Cymbalta and statin was discontinued and she felt better.  She was readmitted to the hospital on 06/26/13 for generalized weakness and shortness of breath.  She was found to have pneumonia and started on antibiotics.  A couple of days after finishing the antibiotics, she developed a significant maculopapular rash on her neck and chest.  She was started on prednisone, and it is slowly improving.  Previously, she had an erythemic rash across her forehead, thought to be secondary to Vancomycin.  Throughout all her hospital stays, she has been rate-controlled.  The rash has improved with very little residual effect in one small area of chest.  Fogginess she experienced before on Keppra has also improved.  No seizures.  Due to stroke and pneumonia, she was undergone deconditioning and currently will be having physical therapy.  She has a walker but doesn't use it all the time.  She does not have a walker now.  She has history of gait problems after knee surgery, but she has been more unsteady since her prolonged hospital admissions.  She has history of numbness on the bottom of her feet.  Retinal migraines:  Of note, she has longstanding history of migraines, described as scotomas, fortification spectrum and flashing lights in the right eye.  There is no associated headache.  Currently, she remains on Keppra 750mg  twice daily, Xarelto, Zebeta.  PAST MEDICAL HISTORY: Past Medical History  Diagnosis Date  . GERD (gastroesophageal reflux disease)   . History of TIAs   . Diverticulosis   . PUD (peptic ulcer disease)   . PVD (peripheral  vascular disease)   . Hyperlipidemia   . Anxiety   . Depression   . Blood transfusion     "w/1st miscarriage" (05/26/2013)  . COPD (chronic obstructive pulmonary disease)   . Hypertension   . PONV (postoperative nausea and vomiting)   . Family history of anesthesia complication     "sisters also get sick as a dog" (05/26/2013)  . Coronary artery disease   . Heart murmur     "when I was a child" (05/26/2013)  . DVT (deep venous thrombosis) 1954    "after I had my first child" (05/26/2013)  . Pneumonia 2013    "once" (05/26/2013)  . Exertional shortness of breath   . H/O hiatal hernia   . Stroke 10/2002    no residual  . Stroke 05/26/2013    "very mild; affected my speech for a while" (05/26/2013)  . Arthritis     "lots; hands" (05/26/2013)  . 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 Lymphona, mantle cell, inguinal region/lower limb     "  large c-cell" (05/26/2013)    MEDICATIONS: Current Outpatient Prescriptions on File Prior to Visit  Medication Sig Dispense Refill  . ALPRAZolam (XANAX) 0.5 MG tablet TAKE ONE TABLET BY MOUTH AT BEDTIME AS NEEDED FOR SLEEP  30 tablet  5  . bisoprolol (ZEBETA) 5 MG tablet TAKE ONE TABLET BY MOUTH ONCE DAILY  30 tablet  11  . diclofenac sodium (VOLTAREN) 1 % GEL Apply 2 g topically 4 (four) times daily.  1 Tube  1  . levETIRAcetam (KEPPRA) 750 MG tablet TAKE ONE TABLET BY MOUTH TWICE DAILY  60 tablet  0  . loperamide (IMODIUM) 2 MG capsule Take 2 mg by mouth daily as needed for diarrhea or loose stools.       . Vitamin D, Ergocalciferol, (DRISDOL) 50000 UNITS CAPS Take 50,000 Units by mouth every Sunday.      Alveda Reasons 15 MG TABS tablet TAKE ONE TABLET BY MOUTH ONCE DAILY AFTER  SUPPER  30 tablet  1   No current facility-administered medications on file prior to visit.    ALLERGIES: Allergies  Allergen Reactions  . Ativan [Lorazepam] Other (See Comments)    Severe hallucinations   .  Propoxyphene Hcl     Rxn: unknown  . Vancomycin   . Penicillins Rash    FAMILY HISTORY: Family History  Problem Relation Age of Onset  . Colon cancer Sister     colon  . Hyperlipidemia Mother   . Stroke Mother   . Cancer Father     mesothelioma    SOCIAL HISTORY: History   Social History  . Marital Status: Married    Spouse Name: N/A    Number of Children: 6  . Years of Education: N/A   Occupational History  . retired    Social History Main Topics  . Smoking status: Former Smoker -- 2.50 packs/day for 37 years    Types: Cigarettes    Quit date: 09/05/1984  . Smokeless tobacco: Never Used  . Alcohol Use: No  . Drug Use: No  . Sexual Activity: Not Currently   Other Topics Concern  . Not on file   Social History Narrative  . No narrative on file    REVIEW OF SYSTEMS: Constitutional: No fevers, chills, or sweats, no generalized fatigue, change in appetite Eyes: No visual changes, double vision, eye pain Ear, nose and throat: No hearing loss, ear pain, nasal congestion, sore throat Cardiovascular: No chest pain, palpitations Respiratory:  No shortness of breath at rest or with exertion, wheezes GastrointestinaI: No nausea, vomiting, diarrhea, abdominal pain, fecal incontinence Genitourinary:  No dysuria, urinary retention or frequency Musculoskeletal:  Back pain Integumentary: No rash, pruritus, skin lesions Neurological: as above Psychiatric: anxiety Endocrine: No palpitations, fatigue, diaphoresis, mood swings, change in appetite, change in weight, increased thirst Hematologic/Lymphatic:  No anemia, purpura, petechiae. Allergic/Immunologic: no itchy/runny eyes, nasal congestion, recent allergic reactions, rashes  PHYSICAL EXAM: Filed Vitals:   04/25/14 1114  BP: 110/64  Pulse: 78   General: No acute distress Head:  Normocephalic/atraumatic Neck: supple, no paraspinal tenderness, full range of motion Heart:  Regular rate and rhythm Lungs:  Clear to  auscultation bilaterally Back: No paraspinal tenderness Neurological Exam: alert and oriented to person, place, and time. Attention span and concentration intact, recent and remote memory intact, fund of knowledge intact.  Speech fluent and not dysarthric, language intact.  CN II-XII intact. Fundi not visualized.  Bulk and tone normal, muscle strength 5/5 throughout.  Reduced pinprick and vibration sensation in  the toes.  Deep tendon reflexes 2+ throughout except absent in the ankles, toes downgoing.  Finger to nose and heel to shin testing intact.  Gait unsteady with stiffening of the legs. Romberg positive.  IMPRESSION: Lower back and bilateral leg pain.  Differential diagnoses include lumbar stenosis or sacroiliac joint dysfunction. Gait instability, likely related to pain.  She does not have signs to suggest cervical myelopathy History of simple partial seizures vs possible complex migraine History of stroke  PLAN: 1.  Will get MRI of the lumbar spine 2.  Start gabapentin 300mg  at bedtime and titrate to 300mg  three times daily 3.  Continue Keppra 750mg  twice daily 4.  She has an appointment with Dr. Tamala [facility].  Will try to get the MRI performed prior to this visit. 5.  Follow up in 2 months.  Metta Clines, DO  CC: [name], MD

## 2014-04-25 NOTE — Patient Instructions (Addendum)
1.  For the leg pain, we will start gabapentin 300mg  capsules.  Take 1 pill at bedtime for 7 days, then 1 pill twice daily for 7 days, then 1 pill three times daily. 2.  We will get MRI of the lumbar spine. We have scheduled you at [facility] for your MRI on 05/07/2014 at 3:00 pm. Please arrive 30 minutes prior and go to [facility]. If you need to change this appt please call [phone]. 3.  Keep the appointment with Dr. Tamala Julian for now.  We will try to get the MRI done prior to your appointment. 4.  Follow up in 2 months.

## 2014-04-30 ENCOUNTER — Ambulatory Visit: Payer: Medicare Other | Admitting: Physical Therapy

## 2014-05-02 ENCOUNTER — Ambulatory Visit: Payer: Medicare Other | Admitting: Physical Therapy

## 2014-05-03 ENCOUNTER — Ambulatory Visit: Payer: Medicare Other | Admitting: Family Medicine

## 2014-05-07 ENCOUNTER — Ambulatory Visit: Payer: Medicare Other | Admitting: Physical Therapy

## 2014-05-07 ENCOUNTER — Ambulatory Visit
Admission: RE | Admit: 2014-05-07 | Discharge: 2014-05-07 | Disposition: A | Discharge status: Home / Self Care | Payer: Medicare Other | Source: Ambulatory Visit | Attending: Neurology | Admitting: Neurology

## 2014-05-07 ENCOUNTER — Other Ambulatory Visit: Payer: Medicare Other

## 2014-05-07 DIAGNOSIS — M5441 Lumbago with sciatica, right side: Secondary | ICD-10-CM

## 2014-05-07 DIAGNOSIS — M5442 Lumbago with sciatica, left side: Principal | ICD-10-CM

## 2014-05-09 ENCOUNTER — Ambulatory Visit (INDEPENDENT_AMBULATORY_CARE_PROVIDER_SITE_OTHER): Payer: Medicare Other | Admitting: Family Medicine

## 2014-05-09 ENCOUNTER — Ambulatory Visit: Payer: Medicare Other | Admitting: Physical Therapy

## 2014-05-09 ENCOUNTER — Encounter: Payer: Self-pay | Admitting: Family Medicine

## 2014-05-09 VITALS — BP 124/64 | HR 88 | Ht 64.5 in | Wt 190.0 lb

## 2014-05-09 DIAGNOSIS — M48062 Spinal stenosis, lumbar region with neurogenic claudication: Secondary | ICD-10-CM | POA: Insufficient documentation

## 2014-05-09 DIAGNOSIS — M4806 Spinal stenosis, lumbar region: Secondary | ICD-10-CM

## 2014-05-09 MED ORDER — GABAPENTIN 300 MG PO CAPS: ORAL_CAPSULE | ORAL | Status: DC

## 2014-05-09 NOTE — Progress Notes (Signed)
  [facility] [place] [place], [place] [address] Phone: [phone] Subjective:    I'm seeing this patient by the request  of:  [name], MD   CC: Back pain with radiculopathy  PPJ:[id number] [name] is a 80 y.o. female coming in with complaint of low back pain. Patient states that the cortisone last 3 months she has had significant amount of bilateral low back pain and does have radiation going down the legs. Patient states at the radiation seems to be going down the posterior lateral aspect of the legs and stockinette her knees. Patient states that this is more of a sharp shooting pain that occurs and is worse with walking or standing for a long amount of time. Patient has been seen by many provider soon did have recent imaging. Patient did have x-rays the lumbar spine show degenerative changes of the hip as well as the spine. Patient did go to urgent care which unfortunately may have made the pain for she states. Patient was seen by neurology recently and an MRI was ordered. MRI was reviewed by me today and showed the patient does have moderate to severe spinal stenosis at the L3-L4 area as well as L4-L5. Slight retrolisthesis at L5-S1.  Patient states though that since she has been given gabapentin by neurology she is doing a little bit better. Patient is to ambulate with the aid of a walker in 3 months ago she was not had any difficulty.     Past medical history, social, surgical and family history all reviewed in electronic medical record.   Review of Systems: No headache, visual changes, nausea, vomiting, diarrhea, constipation, dizziness, abdominal pain, skin rash, fevers, chills, night sweats, weight loss, swollen lymph nodes, body aches, joint swelling, muscle aches, chest pain, shortness of breath, mood changes.   Objective Blood pressure 124/64, pulse 88, height 5' 4.5" (1.638 m), weight 190 lb (86.183 kg), SpO2 94.00%.  General:  No apparent distress alert and oriented x3 mood and affect normal, dressed appropriately.  HEENT: Pupils equal, extraocular movements intact  Respiratory: Patient's speak in full sentences and does not appear short of breath  Cardiovascular: No lower extremity edema, non tender, no erythema  Skin: Warm dry intact with no signs of infection or rash on extremities or on axial skeleton.  Abdomen: Soft nontender  Neuro: Cranial nerves II through XII are intact, neurovascularly intact in all extremities with 2+ DTRs and 2+ pulses.  Lymph: No lymphadenopathy of posterior or anterior cervical chain or axillae bilaterally.  Gait mild antalgic gait with patient using walker.  MSK:  Non tender with full range of motion and good stability and symmetric strength and tone of shoulders, elbows, wrist, hip, knee and ankles bilaterally.  Back Exam:  Inspection: Unremarkable  Motion: Flexion 25 deg, Extension 15 deg with pain, Side Bending to 35 deg bilaterally,  Rotation to 25 deg bilaterally  SLR laying: Negative  XSLR laying: Negative  Palpable tenderness: None. FABER: negative. Sensory change: Gross sensation intact to all lumbar and sacral dermatomes.  Reflexes: 2+ at both patellar tendons, 2+ at achilles tendons, Babinski's downgoing.  Strength at foot  Plantar-flexion: 5/5 Dorsi-flexion: 5/5 Eversion: 5/5 Inversion: 5/5  Leg strength  Quad: 4/5 Hamstring: 4/5 Hip flexor: 4/5 Hip abductors: 4/5  But all seem to be symmetric    Impression and Recommendations:     This case required medical decision making of moderate complexity.

## 2014-05-09 NOTE — Patient Instructions (Addendum)
Very good to meet you Continue the gabapentin and I refilled 300mg  at night.  Ice 20 minutes 2 times daily after activity and before bed.  Exercises 3 times a week.  Take tylenol 650 mg three times a day is the best evidence based medicine we have for arthritis.  Glucosamine sulfate 750mg  twice a day is a supplement that has been shown to help moderate to severe arthritis. Vitamin D 2000 IU daily Fish oil 2 grams daily.  Tumeric 500mg   daily.  Continue voltaren gel when needed.  Capsaicin topically up to four times a day may also help with pain. Shoe inserts with good arch support may be helpful.  [employer] orthotics at [employer] could help. Or good shoes such as Eugenie Birks and [employer] aerobics and cycling with low resistance are the best two types of exercise for arthritis. Come back and see me in 3 weeks.

## 2014-05-09 NOTE — Assessment & Plan Note (Signed)
I do believe the patient does have this moderate to severe spinal stenosis patient's symptoms to correlate well. I do agree with primary care provider on the gabapentin and I hope that this will be beneficial. I discussed other safe over-the-counter medications he can be beneficial as well and patient was given home exercises to do 3 times a week. We did sure proper technique. We discussed icing protocol as well. I do think that the differential also includes a vascular claudication based on the patient's history and we may wind need to consider further workup with ABI if pain continues. We can also try a diagnostic and we'll hopefully as well therapeutic epidural steroid injection if necessary. We may have to correlate toe was getting patient off her anticoagulation during that time. Patient will try the conservative therapy and then come back and see me in 3 weeks we can discuss further. Hold on PT for now.

## 2014-05-11 ENCOUNTER — Telehealth: Payer: Self-pay | Admitting: Family Medicine

## 2014-05-11 NOTE — Telephone Encounter (Signed)
Patient has a question about medicine Gabapentin 300 mg, is it the correct dosage?  Please advise

## 2014-05-11 NOTE — Telephone Encounter (Signed)
Spoke to pt's husband and advised him that the pt needs to take gabapentin 300mg  at bedtime.

## 2014-05-14 ENCOUNTER — Ambulatory Visit: Payer: Medicare Other | Admitting: Physical Therapy

## 2014-05-16 ENCOUNTER — Ambulatory Visit: Payer: Medicare Other | Admitting: Physical Therapy

## 2014-05-21 ENCOUNTER — Ambulatory Visit: Payer: Medicare Other | Admitting: Physical Therapy

## 2014-05-23 ENCOUNTER — Ambulatory Visit: Payer: Medicare Other | Admitting: Physical Therapy

## 2014-05-28 ENCOUNTER — Ambulatory Visit: Payer: Medicare Other | Admitting: Physical Therapy

## 2014-05-30 ENCOUNTER — Ambulatory Visit (INDEPENDENT_AMBULATORY_CARE_PROVIDER_SITE_OTHER): Payer: Medicare Other | Admitting: Family Medicine

## 2014-05-30 ENCOUNTER — Ambulatory Visit: Payer: Medicare Other | Admitting: Physical Therapy

## 2014-05-30 ENCOUNTER — Encounter: Payer: Self-pay | Admitting: Family Medicine

## 2014-05-30 VITALS — BP 112/64 | HR 79 | Ht 64.5 in | Wt 187.0 lb

## 2014-05-30 DIAGNOSIS — R531 Weakness: Secondary | ICD-10-CM

## 2014-05-30 DIAGNOSIS — R29818 Other symptoms and signs involving the nervous system: Secondary | ICD-10-CM

## 2014-05-30 DIAGNOSIS — M48062 Spinal stenosis, lumbar region with neurogenic claudication: Secondary | ICD-10-CM

## 2014-05-30 DIAGNOSIS — R2689 Other abnormalities of gait and mobility: Secondary | ICD-10-CM

## 2014-05-30 DIAGNOSIS — M4806 Spinal stenosis, lumbar region: Secondary | ICD-10-CM

## 2014-05-30 DIAGNOSIS — R279 Unspecified lack of coordination: Secondary | ICD-10-CM

## 2014-05-30 NOTE — Progress Notes (Signed)
[facility] [address] [place], [place] [address] Phone: [phone] Subjective:    I'm seeing this patient by the request  of:  [name], MD   CC: Back pain with radiculopathy follow up  [id number] [name] is a 80 y.o. female coming in with complaint of low back pain. MRI was reviewed by me today and showed the patient does have moderate to severe spinal stenosis at the L3-L4 area as well as L4-L5. Slight retrolisthesis at L5-S1. Patient was given conservative therapy with home exercises, icing protocol, as well as put on gabapentin. Patient states she is doing significantly better. Patient states that the gabapentin does help her at night. Patient has been able to ambulate significantly better. Patient states that she'll he needs her walker when she wakes up in the morning or after sitting greater than 1 hour. Otherwise patient is able to ambulate with a cane which has been multiple months since she's been able to do this. Patient still has some weakness she states in the legs bilaterally as well as some trouble with balance. Patient is wondering if she can make any improvement with this. Patient is taking the over-the-counter medications and natural supplements we discussed as well and has noticed improvement as well with no side effects. Patient states that she is approximately 85% better.   Patient states though that since she has been given gabapentin by neurology she is doing a little bit better. Patient is to ambulate with the aid of a walker in 3 months ago she was not had any difficulty.     Past medical history, social, surgical and family history all reviewed in electronic medical record.   Review of Systems: No headache, visual changes, nausea, vomiting, diarrhea, constipation, dizziness, abdominal pain, skin rash, fevers, chills, night sweats, weight loss, swollen lymph nodes, body aches, joint swelling, muscle aches, chest  pain, shortness of breath, mood changes.   Objective Blood pressure 112/64, pulse 79, height 5' 4.5" (1.638 m), weight 187 lb (84.823 kg), SpO2 92.00%.  General: No apparent distress alert and oriented x3 mood and affect normal, dressed appropriately.  HEENT: Pupils equal, extraocular movements intact  Respiratory: Patient's speak in full sentences and does not appear short of breath  Cardiovascular: No lower extremity edema, non tender, no erythema  Skin: Warm dry intact with no signs of infection or rash on extremities or on axial skeleton.  Abdomen: Soft nontender  Neuro: Cranial nerves II through XII are intact, neurovascularly intact in all extremities with 2+ DTRs and 2+ pulses.  Lymph: No lymphadenopathy of posterior or anterior cervical chain or axillae bilaterally.  Gait mild antalgic gait with patient using walker.  MSK:  Non tender with full range of motion and good stability and symmetric strength and tone of shoulders, elbows, wrist, hip, knee and ankles bilaterally.  Back Exam:  Inspection: Unremarkable  Motion: Flexion 25 deg, Extension 15 deg with pain, Side Bending to 35 deg bilaterally,  Rotation to 25 deg bilaterally  SLR laying: Negative  XSLR laying: Negative  Palpable tenderness: None. FABER: negative. Sensory change: Gross sensation intact to all lumbar and sacral dermatomes.  Reflexes: 2+ at both patellar tendons, 2+ at achilles tendons, Babinski's downgoing.  Strength at foot  Plantar-flexion: 5/5 Dorsi-flexion: 5/5 Eversion: 5/5 Inversion: 5/5  Leg strength  Quad: 4/5 Hamstring: 4/5 Hip flexor: 4/5 Hip abductors: 4/5  But all seem to be symmetric No significant change from previous exam.   Impression and  Recommendations:     This case required medical decision making of moderate complexity.

## 2014-05-30 NOTE — Assessment & Plan Note (Signed)
Patient does have a history of lymphoma. This could cause some of the weakness and deconditioning. We do need to start getting patient to increase her activity slowly. Patient will start on aquatic formal physical therapy we discussed different diet changes and over-the-counter medications a candidate beneficial. Patient will follow up and see me again in 4-6 weeks for further evaluation and treatment.

## 2014-05-30 NOTE — Assessment & Plan Note (Signed)
I do believe the patient's pain is mostly from the spinal stenosis and patient's weakness of the lower extremity skin be been neurogenic claudication. Differential also includes vascular claudication but I do not feel that further workup is necessary with no significant breakdown of the skin in the lower extremity. We discussed continuing the current therapy but we will also add in aquatic formal physical therapy to help with patient strength and balance. I do think the patient's weakness is secondary to deconditioning. Patient will try these interventions and come back and see me again in 4-6 weeks for further evaluation.

## 2014-05-30 NOTE — Patient Instructions (Signed)
Good to see you You are doing great!!!! Ice is your friend Continue the vitamins and the gabapentin at 300mg  at night Physical therapy will be calling you.  See me again in 4-5 weeks.

## 2014-06-07 ENCOUNTER — Telehealth: Payer: Self-pay | Admitting: Interventional Cardiology

## 2014-06-07 NOTE — Telephone Encounter (Signed)
Pt called the incorrect office. Pt was trying to reach Alda @ [facility].correct office tel# provided to pt

## 2014-06-07 NOTE — Telephone Encounter (Signed)
New message          Pt is wanting [name] to give her a call back to set up water exercise

## 2014-06-25 ENCOUNTER — Ambulatory Visit (INDEPENDENT_AMBULATORY_CARE_PROVIDER_SITE_OTHER): Payer: Medicare Other | Admitting: Family Medicine

## 2014-06-25 ENCOUNTER — Encounter: Payer: Self-pay | Admitting: Family Medicine

## 2014-06-25 ENCOUNTER — Other Ambulatory Visit: Payer: Self-pay | Admitting: Family Medicine

## 2014-06-25 VITALS — BP 126/72 | HR 82 | Ht 64.5 in | Wt 189.0 lb

## 2014-06-25 DIAGNOSIS — M48062 Spinal stenosis, lumbar region with neurogenic claudication: Secondary | ICD-10-CM

## 2014-06-25 DIAGNOSIS — M4806 Spinal stenosis, lumbar region: Secondary | ICD-10-CM

## 2014-06-25 NOTE — Assessment & Plan Note (Signed)
Patient's pain as well as weakness I think is still from her spinal stenosis. Patient is starting formal physical therapy this week which I think could be beneficial. Patient will continue with all the other medications at this time. We discussed icing regimen. I do not feel that further imaging is necessary at this time. Patient and will follow-up and see me again in 6 weeks for further evaluation. Patient will continue to work with the neurologist as well for patient's poststroke symptoms that is also contributing to some of her weakness.  Spent greater than 25 minutes with patient face-to-face and had greater than 50% of counseling including as described above in assessment and plan.

## 2014-06-25 NOTE — Patient Instructions (Addendum)
It is good to see you as always.  Continue the exercises and finish up with physical therapy for next 3 visits.  Continue to do the exercises 3 times a week that I gave you  Stay active, best thing you can do.  The stationary bike is great! See me again in 6 weeks.

## 2014-06-25 NOTE — Progress Notes (Signed)
  [facility] [place] [place], [place] [address] Phone: [phone] Subjective:     CC: Back pain with radiculopathy follow up  [id number] [name] is a 80 y.o. female coming in with complaint of low back pain. MRI w showed the patient does have moderate to severe spinal stenosis at the L3-L4 area as well as L4-L5. Slight retrolisthesis at L5-S1.  Patient was given conservative therapy with home exercises, icing protocol, as well as put on gabapentin. Patient overall at last visit was having a decrease of 85% of the pain. Patient still had weakness in the legs bilaterally and has started formal physical therapy. Patient states she is willing gone to one physical therapy visit since last follow-up. Patient states overall she is doing relatively well. Patient states that the weakness in his legs and may be improve slowly. Patient also had CVA and is following up with neurology. Patient denies any new symptoms. States that the gabapentin does help her sleep. Overall she would consider herself improving slowly. Still ambulates in the morning with a walker but otherwise barely uses a cane during the day.    Past medical history, social, surgical and family history all reviewed in electronic medical record.   Review of Systems: No headache, visual changes, nausea, vomiting, diarrhea, constipation, dizziness, abdominal pain, skin rash, fevers, chills, night sweats, weight loss, swollen lymph nodes, body aches, joint swelling, muscle aches, chest pain, shortness of breath, mood changes.   Objective Blood pressure 126/72, pulse 82, height 5' 4.5" (1.638 m), weight 189 lb (85.73 kg), SpO2 92 %.  General: No apparent distress alert and oriented x3 mood and affect normal, dressed appropriately.  HEENT: Pupils equal, extraocular movements intact  Respiratory: Patient's speak in full sentences and does not appear short of breath  Cardiovascular: No lower  extremity edema, non tender, no erythema  Skin: Warm dry intact with no signs of infection or rash on extremities or on axial skeleton.  Abdomen: Soft nontender  Neuro: Cranial nerves II through XII are intact, neurovascularly intact in all extremities with 2+ DTRs and 2+ pulses.  Lymph: No lymphadenopathy of posterior or anterior cervical chain or axillae bilaterally.  Gait mild antalgic gait with patient using walker.  MSK:  Non tender with full range of motion and good stability and symmetric strength and tone of shoulders, elbows, wrist, hip, knee and ankles bilaterally.  Back Exam:  Inspection: Unremarkable  Motion: Flexion 25 deg, Extension 15 deg with pain, Side Bending to 35 deg bilaterally,  Rotation to 25 deg bilaterally  SLR laying: Negative  XSLR laying: Negative  Palpable tenderness: Mild paraspinal musculature tenderness which is new from previous exam. FABER: negative. Sensory change: Gross sensation intact to all lumbar and sacral dermatomes.  Reflexes: 2+ at both patellar tendons, 2+ at achilles tendons, Babinski's downgoing.  Strength at foot  Plantar-flexion: 5/5 Dorsi-flexion: 5/5 Eversion: 5/5 Inversion: 5/5  Leg strength  Quad: 4/5 Hamstring: 4/5 Hip flexor: 4/5 Hip abductors: 4/5  But all seem to be symmetric   Impression and Recommendations:     This case required medical decision making of moderate complexity.

## 2014-06-26 ENCOUNTER — Ambulatory Visit (INDEPENDENT_AMBULATORY_CARE_PROVIDER_SITE_OTHER): Payer: Medicare Other | Admitting: Neurology

## 2014-06-26 ENCOUNTER — Encounter: Payer: Self-pay | Admitting: Neurology

## 2014-06-26 VITALS — BP 130/76 | HR 74 | Temp 98.2°F | Resp 16 | Ht 64.5 in | Wt 190.4 lb

## 2014-06-26 DIAGNOSIS — R569 Unspecified convulsions: Secondary | ICD-10-CM

## 2014-06-26 DIAGNOSIS — M4806 Spinal stenosis, lumbar region: Secondary | ICD-10-CM

## 2014-06-26 DIAGNOSIS — M48062 Spinal stenosis, lumbar region with neurogenic claudication: Secondary | ICD-10-CM

## 2014-06-26 DIAGNOSIS — Z8673 Personal history of transient ischemic attack (TIA), and cerebral infarction without residual deficits: Secondary | ICD-10-CM

## 2014-06-26 NOTE — Patient Instructions (Signed)
You seem to be doing well. Continue the [place] Start physical therapy for the back and balance Follow up in 6 months but call with questions or concerns.

## 2014-06-26 NOTE — Progress Notes (Signed)
NEUROLOGY FOLLOW UP OFFICE NOTE  [facility] [id number]  HISTORY OF PRESENT ILLNESS: [name] is an 80 year old right-handed woman with history of hypertension, atrial fibrillation, peripheral vascular disease, anxiety, depression, B Cell lymphoma status post chemotherapy, and COPD who follows up for cardio-embolic stroke and seizure secondary to stroke.  She is accompanied by her husband.  Records and MRI of lumbar spine personally reviewed.  UPDATE: She currently is taking Keppra 750mg  twice daily.  She is doing well Due to sudden onset bilateral low back pain radiating down the legs, she had an MRI of the lumbar spine performed on 05/08/14, which revealed moderate multifactorial spinal stenosis at L4-5 and mild spinal stenosis at L3-4 and L5-S1, due to progressive degenerative changes.  She has been receiving therapy by Dr. Tamala Julian of sport's medicine..  She has been doing well with him and she is to start PT.  She notes continued balance problems  HISTORY: Since childhood, she has episodes of numbness in either of both hands and inability to get words out.  It lasts for about 2 minutes.  It should be known that she has had these episodes since childhood.  Sometimes it was associated with headache.  She had them about twice a year until young adulthood, when they resolved.  Last fall, she had several severe episodes and was started on Keppra at that time, for possible seizures.    She presented to the ED on 05/24/13 with confusion.  She had a negative head CT and was discharged.  She returned to the ED on 05/26/13 with aphasia.  An MRI of the brain was performed, which revealed an acute lacunar infarct in the left anterior corona radiata and lentiform nucleus.  CTA of the head revealed atherosclerotic changes in the cavernous carotid arteries without significant stenosis.  CTA of neck revealed focal 50% stenosis of mid right ICA.  2D Echo revealed 50-55% EF with no source of embolus.   Hgb A1c 5.9, LDL 97.  She was switched from [name] to Eliquis.  She was readmitted to the hospital on 05/31/13 with recurrent aphasia.  She had trouble getting her words out and then couldn't speak at all.  Repeat MRI of brain revealed new punctate infarct in the left posterior corona radiata, the same location as previous infarct. No changes in management were made.  She was readmitted to the hospital on 06/07/13 for altered mental status.  During the night, her husband found her standing naked in the closet putting on a summer dress with a sweater hanging out of one arm.  She was amnestic to this event.  When she woke up early in the morning, she exhibited again expressive aphasia, gait imbalance and nausea.  MRI of brain revealed no acute abnormality.  EEG was performed, which revealed intermittent infrequent left fronto-parieto-temporal theta slowing.  Due to possibility of seizure, she was started on Keppra 250mg  BID.  No change made to Eliquis.  She presented to the ED on 06/16/13 after waking up in the morning with expressive aphasia and right-sided weakness.  Symptoms improved in the ED.  CT head revealed prior lacunar infarct in left lentiform nucleus and corona radiata, but no new abnormalities.  It was suspected that she may have had another seizure and her Keppra was increased to 500mg  BID.  She was admitted to the hospital again on 06/21/13 again for expressive aphasia.  MRI brain revealed 2 new punctate infarcts in the bilateral hemispheres at level of centrum  semiovale.  MRA of head was stable.  Carotid dopplers revealed right 1-39% ICA stenosis and left 20-49% ICA stenosis.  2D echo stable.  Eliquis was changed to Xarelto. Keppra was increased to 750mg  BID.  A statin was added, as well.  She developed lethargy, so her Cymbalta and statin was discontinued and she felt better.  She was readmitted to the hospital on 06/26/13 for generalized weakness and shortness of breath.  She was found to  have pneumonia and started on antibiotics.  A couple of days after finishing the antibiotics, she developed a significant maculopapular rash on her neck and chest.  She was started on prednisone, and it is slowly improving.  Previously, she had an erythemic rash across her forehead, thought to be secondary to Vancomycin.  Throughout all her hospital stays, she has been rate-controlled.  The rash has improved with very little residual effect in one small area of chest.  Fogginess she experienced before on Keppra has also improved.  No seizures.  Due to stroke and pneumonia, she was undergone deconditioning and currently will be having physical therapy.  She has a walker but doesn't use it all the time.  She does not have a walker now.  She has history of gait problems after knee surgery, but she has been more unsteady since her prolonged hospital admissions.  She has history of numbness on the bottom of her feet.  Retinal migraines:  Of note, she has longstanding history of migraines, described as scotomas, fortification spectrum and flashing lights in the right eye.  There is no associated headache.  Currently, she remains on Keppra 750mg  twice daily, Xarelto, Zebeta.  PAST MEDICAL HISTORY: Past Medical History  Diagnosis Date  . GERD (gastroesophageal reflux disease)   . History of TIAs   . Diverticulosis   . PUD (peptic ulcer disease)   . PVD (peripheral vascular disease)   . Hyperlipidemia   . Anxiety   . Depression   . Blood transfusion     "w/1st miscarriage" (05/26/2013)  . COPD (chronic obstructive pulmonary disease)   . Hypertension   . PONV (postoperative nausea and vomiting)   . Family history of anesthesia complication     "sisters also get sick as a dog" (05/26/2013)  . Coronary artery disease   . Heart murmur     "when I was a child" (05/26/2013)  . DVT (deep venous thrombosis) 1954    "after I had my first child" (05/26/2013)  . Pneumonia 2013    "once" (05/26/2013)  .  Exertional shortness of breath   . H/O hiatal hernia   . Stroke 10/2002    no residual  . Stroke 05/26/2013    "very mild; affected my speech for a while" (05/26/2013)  . Arthritis     "lots; hands" (05/26/2013)  . 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 Lymphona, mantle cell, inguinal region/lower limb     "large c-cell" (05/26/2013)    MEDICATIONS: Current Outpatient Prescriptions on File Prior to Visit  Medication Sig Dispense Refill  . ALPRAZolam (XANAX) 0.5 MG tablet TAKE ONE TABLET BY MOUTH AT BEDTIME AS NEEDED FOR SLEEP 30 tablet 5  . bisoprolol (ZEBETA) 5 MG tablet TAKE ONE TABLET BY MOUTH ONCE DAILY 30 tablet 11  . diclofenac sodium (VOLTAREN) 1 % GEL Apply 2 g topically 4 (four) times daily. 1 Tube 1  . gabapentin (NEURONTIN) 300 MG capsule nightly 90 capsule 3  . levETIRAcetam (  KEPPRA) 750 MG tablet TAKE ONE TABLET BY MOUTH TWICE DAILY 60 tablet 0  . loperamide (IMODIUM) 2 MG capsule Take 2 mg by mouth daily as needed for diarrhea or loose stools.     . Vitamin D, Ergocalciferol, (DRISDOL) 50000 UNITS CAPS Take 50,000 Units by mouth every Sunday.    Alveda Reasons 15 MG TABS tablet TAKE ONE TABLET BY MOUTH ONCE DAILY AFTER SUPPER. 30 tablet 0   No current facility-administered medications on file prior to visit.    ALLERGIES: Allergies  Allergen Reactions  . Ativan [Lorazepam] Other (See Comments)    Severe hallucinations   . Propoxyphene Hcl     Rxn: unknown  . Vancomycin   . Penicillins Rash    FAMILY HISTORY: Family History  Problem Relation Age of Onset  . Colon cancer Sister     colon  . Hyperlipidemia Mother   . Stroke Mother   . Cancer Father     mesothelioma    SOCIAL HISTORY: History   Social History  . Marital Status: Married    Spouse Name: N/A    Number of Children: 6  . Years of Education: N/A   Occupational History  . retired    Social History Main Topics  . Smoking status: Former Smoker  -- 2.50 packs/day for 37 years    Types: Cigarettes    Quit date: 09/05/1984  . Smokeless tobacco: Never Used  . Alcohol Use: No  . Drug Use: No  . Sexual Activity: Not Currently   Other Topics Concern  . Not on file   Social History Narrative    REVIEW OF SYSTEMS: Constitutional: No fevers, chills, or sweats, no generalized fatigue, change in appetite Eyes: No visual changes, double vision, eye pain Ear, nose and throat: No hearing loss, ear pain, nasal congestion, sore throat Cardiovascular: No chest pain, palpitations Respiratory:  No shortness of breath at rest or with exertion, wheezes GastrointestinaI: No nausea, vomiting, diarrhea, abdominal pain, fecal incontinence Genitourinary:  No dysuria, urinary retention or frequency Musculoskeletal:  No neck pain, back pain Integumentary: No rash, pruritus, skin lesions Neurological: as above Psychiatric: No depression, insomnia, anxiety Endocrine: No palpitations, fatigue, diaphoresis, mood swings, change in appetite, change in weight, increased thirst Hematologic/Lymphatic:  No anemia, purpura, petechiae. Allergic/Immunologic: no itchy/runny eyes, nasal congestion, recent allergic reactions, rashes  PHYSICAL EXAM: Filed Vitals:   06/26/14 1056  BP: 130/76  Pulse: 74  Temp: 98.2 F (36.8 C)  Resp: 16   General: No acute distress Head:  Normocephalic/atraumatic Eyes:  Fundi not visualized on inspection Neck: supple, no paraspinal tenderness, full range of motion Heart:  Regular rate and rhythm Lungs:  Clear to auscultation bilaterally Back: No paraspinal tenderness Neurological Exam: alert and oriented to person, place, and time. Attention span and concentration intact, recent and remote memory intact, fund of knowledge intact.  Speech fluent and not dysarthric, language intact.  CN II-XII intact. Fundi not visualized on inspection.  Bulk and tone normal, muscle strength 5/5 in upper extremities and 5-/5 lower extremities.   Deep tendon reflexes 2+ throughout, toes downgoing.  Finger to nose testing with postural and kinetic tremor but no dysmetria.  Gait mildly wide-based and antalgic.  IMPRESSION: History of simple partial seizures vs possible complex migraine History of stroke Lumbar spinal stenosis  PLAN: Keppra 750mg  twice daily Xarelto and Zebeta PT Follow up in 6 months.  Metta Clines, DO  CC:  [name], MD

## 2014-07-04 ENCOUNTER — Ambulatory Visit: Payer: Medicare Other | Admitting: Neurology

## 2014-07-25 ENCOUNTER — Telehealth: Payer: Self-pay | Admitting: Family Medicine

## 2014-07-25 MED ORDER — RIVAROXABAN 15 MG PO TABS: 15.0000 mg | ORAL_TABLET | Freq: Every day | ORAL | Status: DC

## 2014-07-25 NOTE — Telephone Encounter (Signed)
Medication refilled

## 2014-07-25 NOTE — Telephone Encounter (Signed)
Pt request refill of the following: XARELTO 15 MG TABS tablet   Phamacy: [facility]

## 2014-08-03 ENCOUNTER — Emergency Department (HOSPITAL_COMMUNITY): Payer: Medicare Other

## 2014-08-03 ENCOUNTER — Encounter (HOSPITAL_COMMUNITY): Payer: Self-pay | Admitting: Emergency Medicine

## 2014-08-03 ENCOUNTER — Emergency Department (HOSPITAL_COMMUNITY)
Admission: EM | Admit: 2014-08-03 | Discharge: 2014-08-03 | Disposition: A | Discharge status: Home / Self Care | Payer: Medicare Other | Attending: Emergency Medicine | Admitting: Emergency Medicine

## 2014-08-03 DIAGNOSIS — Z7901 Long term (current) use of anticoagulants: Secondary | ICD-10-CM | POA: Diagnosis not present

## 2014-08-03 DIAGNOSIS — Y9301 Activity, walking, marching and hiking: Secondary | ICD-10-CM | POA: Diagnosis not present

## 2014-08-03 DIAGNOSIS — Z8719 Personal history of other diseases of the digestive system: Secondary | ICD-10-CM | POA: Diagnosis not present

## 2014-08-03 DIAGNOSIS — Z8701 Personal history of pneumonia (recurrent): Secondary | ICD-10-CM | POA: Insufficient documentation

## 2014-08-03 DIAGNOSIS — Z86008 Personal history of in-situ neoplasm of other site: Secondary | ICD-10-CM | POA: Insufficient documentation

## 2014-08-03 DIAGNOSIS — X58XXXA Exposure to other specified factors, initial encounter: Secondary | ICD-10-CM | POA: Diagnosis not present

## 2014-08-03 DIAGNOSIS — J449 Chronic obstructive pulmonary disease, unspecified: Secondary | ICD-10-CM | POA: Diagnosis not present

## 2014-08-03 DIAGNOSIS — M1990 Unspecified osteoarthritis, unspecified site: Secondary | ICD-10-CM | POA: Diagnosis not present

## 2014-08-03 DIAGNOSIS — Z8711 Personal history of peptic ulcer disease: Secondary | ICD-10-CM | POA: Diagnosis not present

## 2014-08-03 DIAGNOSIS — Z791 Long term (current) use of non-steroidal anti-inflammatories (NSAID): Secondary | ICD-10-CM | POA: Insufficient documentation

## 2014-08-03 DIAGNOSIS — R011 Cardiac murmur, unspecified: Secondary | ICD-10-CM | POA: Diagnosis not present

## 2014-08-03 DIAGNOSIS — I10 Essential (primary) hypertension: Secondary | ICD-10-CM | POA: Insufficient documentation

## 2014-08-03 DIAGNOSIS — S76011A Strain of muscle, fascia and tendon of right hip, initial encounter: Secondary | ICD-10-CM | POA: Insufficient documentation

## 2014-08-03 DIAGNOSIS — Y9289 Other specified places as the place of occurrence of the external cause: Secondary | ICD-10-CM | POA: Diagnosis not present

## 2014-08-03 DIAGNOSIS — Y998 Other external cause status: Secondary | ICD-10-CM | POA: Insufficient documentation

## 2014-08-03 DIAGNOSIS — Z8639 Personal history of other endocrine, nutritional and metabolic disease: Secondary | ICD-10-CM | POA: Insufficient documentation

## 2014-08-03 DIAGNOSIS — Z880 Allergy status to penicillin: Secondary | ICD-10-CM | POA: Diagnosis not present

## 2014-08-03 DIAGNOSIS — S79911A Unspecified injury of right hip, initial encounter: Secondary | ICD-10-CM | POA: Diagnosis present

## 2014-08-03 DIAGNOSIS — F419 Anxiety disorder, unspecified: Secondary | ICD-10-CM | POA: Diagnosis not present

## 2014-08-03 DIAGNOSIS — J441 Chronic obstructive pulmonary disease with (acute) exacerbation: Secondary | ICD-10-CM | POA: Diagnosis not present

## 2014-08-03 DIAGNOSIS — F329 Major depressive disorder, single episode, unspecified: Secondary | ICD-10-CM | POA: Diagnosis not present

## 2014-08-03 DIAGNOSIS — Z79899 Other long term (current) drug therapy: Secondary | ICD-10-CM | POA: Insufficient documentation

## 2014-08-03 DIAGNOSIS — Z87891 Personal history of nicotine dependence: Secondary | ICD-10-CM | POA: Diagnosis not present

## 2014-08-03 DIAGNOSIS — I2510 Atherosclerotic heart disease of native coronary artery without angina pectoris: Secondary | ICD-10-CM | POA: Diagnosis not present

## 2014-08-03 DIAGNOSIS — Z86718 Personal history of other venous thrombosis and embolism: Secondary | ICD-10-CM | POA: Insufficient documentation

## 2014-08-03 DIAGNOSIS — Z8673 Personal history of transient ischemic attack (TIA), and cerebral infarction without residual deficits: Secondary | ICD-10-CM | POA: Insufficient documentation

## 2014-08-03 NOTE — ED Notes (Signed)
Pt states that she thinks she may have been "walking wrong" and now is c/o rt hip pain.  Denies specific injury.  Pain since 2000 last night.

## 2014-08-03 NOTE — ED Notes (Signed)
RETURNED FROM X-RAY

## 2014-08-03 NOTE — ED Notes (Signed)
MD at bedside. EDP [facility] PRESENT TO EVALUATE THIS PT

## 2014-08-03 NOTE — ED Notes (Signed)
Patient ambulated well in hallway with walker.

## 2014-08-03 NOTE — ED Provider Notes (Signed)
CSN: [id number]     Arrival date & time 08/03/14  1012 History   First MD Initiated Contact with Patient 08/03/14 1025     Chief Complaint  Patient presents with  . Hip Pain     (Consider location/radiation/quality/duration/timing/severity/associated sxs/prior Treatment) HPI  81 -year-old female presents with right hip pain since last night. Patient was walking with her walker and felt pain when she planted her right foot. This pain is in her right hip. Pain is not present at rest and only occurs with weightbearing. She denies any direct trauma. No weakness or numbness. She has chronic back pain and bilateral sciatica. This pain is different than that pain. She declines any pain medicine. No abdominal pain or new back pain.  Past Medical History  Diagnosis Date  . GERD (gastroesophageal reflux disease)   . History of TIAs   . Diverticulosis   . PUD (peptic ulcer disease)   . PVD (peripheral vascular disease)   . Hyperlipidemia   . Anxiety   . Depression   . Blood transfusion     "w/1st miscarriage" (05/26/2013)  . COPD (chronic obstructive pulmonary disease)   . Hypertension   . PONV (postoperative nausea and vomiting)   . Family history of anesthesia complication     "sisters also get sick as a dog" (05/26/2013)  . Coronary artery disease   . Heart murmur     "when I was a child" (05/26/2013)  . DVT (deep venous thrombosis) 1954    "after I had my first child" (05/26/2013)  . Pneumonia 2013    "once" (05/26/2013)  . Exertional shortness of breath   . H/O hiatal hernia   . Stroke 10/2002    no residual  . Stroke 05/26/2013    "very mild; affected my speech for a while" (05/26/2013)  . Arthritis     "lots; hands" (05/26/2013)  . 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 Lymphona, mantle cell, inguinal region/lower limb     "large c-cell" (05/26/2013)   Past Surgical History  Procedure Laterality Date  . Diverting ileostomy   09/22/2008    iliostomy for diverticulitis/notes 09/22/2008 (05/26/2013)  . Tubal ligation  1972  . Radical neck dissection  1986    "larynx cancer" (05/26/2013)  . Total knee arthroplasty Bilateral     2002 left 2008 right  . Ileostomy closure  01/2010  . Cataract extraction  2005    bilateral  . Carotid endarterectomy Bilateral 2004    2004 a month apart right and left  . Tonsillectomy and adenoidectomy  1936    "tonsils grew back; no 2nd OR" (05/26/2013)  . Hernia repair  05/2010    ventral hernia repair  . Carpal tunnel release Left ?1960's  . Abdominal exploration surgery  12/01/2012  . Appendectomy  09/13/2008  . Colon surgery  09/13/2008    Laparoscopic assisted converted to open sigmoid colectomy.Archie Endo 09/13/2008 (05/26/2013)  . Cataract extraction w/ intraocular lens  implant, bilateral Bilateral ~ 2003  . Dilation and curettage of uterus  1950's    "had 2 for miscarriages" (05/26/2013)   Family History  Problem Relation Age of Onset  . Colon cancer Sister     colon  . Hyperlipidemia Mother   . Stroke Mother   . Cancer Father     mesothelioma   History  Substance Use Topics  . Smoking status: Former Smoker -- 2.50 packs/day for 37 years    Types:  Cigarettes    Quit date: 09/05/1984  . Smokeless tobacco: Never Used  . Alcohol Use: No   OB History    No data available     Review of Systems  Gastrointestinal: Negative for abdominal pain.  Musculoskeletal: Positive for back pain (chronic) and arthralgias.  Neurological: Negative for weakness and numbness.  All other systems reviewed and are negative.     Allergies  Ativan; Propoxyphene hcl; Vancomycin; and Penicillins  Home Medications   Prior to Admission medications   Medication Sig Start Date End Date Taking? Authorizing Provider  ALPRAZolam Duanne Moron) 0.5 MG tablet TAKE ONE TABLET BY MOUTH AT BEDTIME AS NEEDED FOR SLEEP 03/20/14   Ricard Dillon, MD  bisoprolol (ZEBETA) 5 MG tablet TAKE ONE TABLET BY MOUTH  ONCE DAILY    Ricard Dillon, MD  diclofenac sodium (VOLTAREN) 1 % GEL Apply 2 g topically 4 (four) times daily. 01/22/14   Timoteo Gaul, FNP  gabapentin (NEURONTIN) 300 MG capsule nightly 05/09/14   Lyndal Pulley, DO  levETIRAcetam (KEPPRA) 750 MG tablet TAKE ONE TABLET BY MOUTH TWICE DAILY 01/26/14   Dudley Major, DO  loperamide (IMODIUM) 2 MG capsule Take 2 mg by mouth daily as needed for diarrhea or loose stools.     Historical Provider, MD  Rivaroxaban (XARELTO) 15 MG TABS tablet Take 1 tablet (15 mg total) by mouth daily with supper. 07/25/14   Marin Olp, MD  Vitamin D, Ergocalciferol, (DRISDOL) 50000 UNITS CAPS Take 50,000 Units by mouth every Sunday.    Historical Provider, MD   BP 126/50 mmHg  Pulse 73  Temp(Src) 97.4 F (36.3 C) (Oral)  Resp 18  SpO2 100% Physical Exam  Constitutional: She is oriented to person, place, and time. She appears well-developed and well-nourished. No distress.  HENT:  Head: Normocephalic and atraumatic.  Right Ear: External ear normal.  Left Ear: External ear normal.  Nose: Nose normal.  Eyes: Right eye exhibits no discharge. Left eye exhibits no discharge.  Cardiovascular: Normal rate, regular rhythm and normal heart sounds.   Pulmonary/Chest: Effort normal and breath sounds normal.  Abdominal: Soft. She exhibits no distension. There is no tenderness.  Musculoskeletal:       Right hip: She exhibits tenderness. She exhibits normal range of motion.       Right knee: She exhibits normal range of motion. No tenderness found.       Legs: Neurological: She is alert and oriented to person, place, and time.  Skin: Skin is warm and dry.  Nursing note and vitals reviewed.   ED Course  Procedures (including critical care time) Labs Review Labs Reviewed - No data to display  Imaging Review Dg Hip Complete Right  08/03/2014   CLINICAL DATA:  Right-sided hip pain.  EXAM: RIGHT HIP - COMPLETE 2+ VIEW  COMPARISON:  None.  FINDINGS: No  fracture or dislocation is identified. There is mild osteoarthritis involving the hip joint. Bones are osteopenic. The bony pelvis is intact. No soft tissue abnormalities or bony lesions.  IMPRESSION: No fracture or dislocation.  Mild osteoarthritis of the right hip.   Electronically Signed   By: Aletta Edouard M.D.   On: 08/03/2014 11:56     EKG Interpretation None      MDM   Final diagnoses:  Hip strain, right, initial encounter    Patient's hip x-ray is benign. Patient is able to get up and ambulate normally with her walker. Low suspicion for occult hip fracture.  This most likely a muscular strain given the nature of injury. She declines pain meds in the ER. Will discharge back to follow-up with her PCP.    Ephraim Hamburger, MD 08/03/14 [phone]

## 2014-08-03 NOTE — ED Notes (Signed)
MD at bedside. 

## 2014-08-05 ENCOUNTER — Emergency Department (HOSPITAL_COMMUNITY): Payer: Medicare Other

## 2014-08-05 ENCOUNTER — Encounter (HOSPITAL_COMMUNITY): Payer: Self-pay | Admitting: Emergency Medicine

## 2014-08-05 ENCOUNTER — Emergency Department (HOSPITAL_COMMUNITY)
Admission: EM | Admit: 2014-08-05 | Discharge: 2014-08-05 | Disposition: A | Discharge status: Home / Self Care | Payer: Medicare Other | Attending: Emergency Medicine | Admitting: Emergency Medicine

## 2014-08-05 DIAGNOSIS — Z87891 Personal history of nicotine dependence: Secondary | ICD-10-CM | POA: Diagnosis not present

## 2014-08-05 DIAGNOSIS — Z8701 Personal history of pneumonia (recurrent): Secondary | ICD-10-CM | POA: Insufficient documentation

## 2014-08-05 DIAGNOSIS — I2510 Atherosclerotic heart disease of native coronary artery without angina pectoris: Secondary | ICD-10-CM | POA: Diagnosis not present

## 2014-08-05 DIAGNOSIS — Z86718 Personal history of other venous thrombosis and embolism: Secondary | ICD-10-CM | POA: Diagnosis not present

## 2014-08-05 DIAGNOSIS — Z8521 Personal history of malignant neoplasm of larynx: Secondary | ICD-10-CM | POA: Insufficient documentation

## 2014-08-05 DIAGNOSIS — J449 Chronic obstructive pulmonary disease, unspecified: Secondary | ICD-10-CM | POA: Insufficient documentation

## 2014-08-05 DIAGNOSIS — Z8711 Personal history of peptic ulcer disease: Secondary | ICD-10-CM | POA: Insufficient documentation

## 2014-08-05 DIAGNOSIS — R4701 Aphasia: Secondary | ICD-10-CM

## 2014-08-05 DIAGNOSIS — I639 Cerebral infarction, unspecified: Secondary | ICD-10-CM

## 2014-08-05 DIAGNOSIS — M1990 Unspecified osteoarthritis, unspecified site: Secondary | ICD-10-CM | POA: Insufficient documentation

## 2014-08-05 DIAGNOSIS — F329 Major depressive disorder, single episode, unspecified: Secondary | ICD-10-CM | POA: Insufficient documentation

## 2014-08-05 DIAGNOSIS — F801 Expressive language disorder: Secondary | ICD-10-CM | POA: Diagnosis not present

## 2014-08-05 DIAGNOSIS — R011 Cardiac murmur, unspecified: Secondary | ICD-10-CM | POA: Insufficient documentation

## 2014-08-05 DIAGNOSIS — F419 Anxiety disorder, unspecified: Secondary | ICD-10-CM | POA: Insufficient documentation

## 2014-08-05 DIAGNOSIS — Z791 Long term (current) use of non-steroidal anti-inflammatories (NSAID): Secondary | ICD-10-CM | POA: Diagnosis not present

## 2014-08-05 DIAGNOSIS — Z8673 Personal history of transient ischemic attack (TIA), and cerebral infarction without residual deficits: Secondary | ICD-10-CM | POA: Insufficient documentation

## 2014-08-05 DIAGNOSIS — I10 Essential (primary) hypertension: Secondary | ICD-10-CM | POA: Insufficient documentation

## 2014-08-05 DIAGNOSIS — Z79899 Other long term (current) drug therapy: Secondary | ICD-10-CM | POA: Diagnosis not present

## 2014-08-05 DIAGNOSIS — Z880 Allergy status to penicillin: Secondary | ICD-10-CM | POA: Diagnosis not present

## 2014-08-05 LAB — COMPREHENSIVE METABOLIC PANEL
ALBUMIN: 4.1 g/dL (ref 3.5–5.2)
ALK PHOS: 74 U/L (ref 39–117)
ALT: 17 U/L (ref 0–35)
AST: 20 U/L (ref 0–37)
Anion gap: 10 (ref 5–15)
BUN: 15 mg/dL (ref 6–23)
CO2: 23 mmol/L (ref 19–32)
Calcium: 9.6 mg/dL (ref 8.4–10.5)
Chloride: 106 mEq/L (ref 96–112)
Creatinine, Ser: 1.07 mg/dL (ref 0.50–1.10)
GFR calc Af Amer: 54 mL/min — ABNORMAL LOW (ref >90)
GFR calc non Af Amer: 46 mL/min — ABNORMAL LOW (ref >90)
GLUCOSE: 97 mg/dL (ref 70–99)
POTASSIUM: 4.6 mmol/L (ref 3.5–5.1)
SODIUM: 139 mmol/L (ref 135–145)
Total Bilirubin: 0.7 mg/dL (ref 0.3–1.2)
Total Protein: 7.4 g/dL (ref 6.0–8.3)

## 2014-08-05 LAB — CBC
HEMATOCRIT: 39.9 % (ref 36.0–46.0)
Hemoglobin: 12.7 g/dL (ref 12.0–15.0)
MCH: 28.2 pg (ref 26.0–34.0)
MCHC: 31.8 g/dL (ref 30.0–36.0)
MCV: 88.5 fL (ref 78.0–100.0)
Platelets: 186 10*3/uL (ref 150–400)
RBC: 4.51 MIL/uL (ref 3.87–5.11)
RDW: 14 % (ref 11.5–15.5)
WBC: 5.9 10*3/uL (ref 4.0–10.5)

## 2014-08-05 LAB — DIFFERENTIAL
BASOS ABS: 0 10*3/uL (ref 0.0–0.1)
Basophils Relative: 0 % (ref 0–1)
Eosinophils Absolute: 0.2 10*3/uL (ref 0.0–0.7)
Eosinophils Relative: 4 % (ref 0–5)
Lymphocytes Relative: 31 % (ref 12–46)
Lymphs Abs: 1.8 10*3/uL (ref 0.7–4.0)
Monocytes Absolute: 0.5 10*3/uL (ref 0.1–1.0)
Monocytes Relative: 9 % (ref 3–12)
NEUTROS PCT: 56 % (ref 43–77)
Neutro Abs: 3.3 10*3/uL (ref 1.7–7.7)

## 2014-08-05 LAB — URINALYSIS, ROUTINE W REFLEX MICROSCOPIC
BILIRUBIN URINE: NEGATIVE
Glucose, UA: NEGATIVE mg/dL
Hgb urine dipstick: NEGATIVE
Ketones, ur: NEGATIVE mg/dL
Leukocytes, UA: NEGATIVE
NITRITE: NEGATIVE
Protein, ur: NEGATIVE mg/dL
Specific Gravity, Urine: 1.008 (ref 1.005–1.030)
Urobilinogen, UA: 0.2 mg/dL (ref 0.0–1.0)
pH: 7 (ref 5.0–8.0)

## 2014-08-05 LAB — PROTIME-INR
INR: 1.21 (ref 0.00–1.49)
PROTHROMBIN TIME: 15.4 s — AB (ref 11.6–15.2)

## 2014-08-05 LAB — APTT: aPTT: 33 seconds (ref 24–37)

## 2014-08-05 LAB — I-STAT TROPONIN, ED: TROPONIN I, POC: 0.01 ng/mL (ref 0.00–0.08)

## 2014-08-05 MED ORDER — DIAZEPAM 5 MG/ML IJ SOLN
5.0000 mg | Freq: Once | INTRAMUSCULAR | Status: AC
  Administered 2014-08-05: 5 mg via INTRAVENOUS
  Filled 2014-08-05: qty 2

## 2014-08-05 NOTE — ED Notes (Signed)
Pt here with multiple episodes of aphasia x 3 days that is now resolved; pt with aphasia prior to arrival; pt with hx of stroke in past

## 2014-08-05 NOTE — Consult Note (Signed)
Referring Physician: Dr. Reather Converse    Chief Complaint: transient dysphasia  HPI:                                                                                                                                         [name] is an 81 y.o. female with a past medical history significant for HTN, hyperlipidemia, PVD, CAD, atrial fibrillation on xarelto, s/p bilateral CEA, ischemic infarcts without residual deficits (last stroke 11/15), brought in for further evaluation of recurrent episodes of language impairment Patient and family report at least 3 episodes of difficulty expressing herself in the last 3 days. Patient said that the episodes are quite similar, she knows what she wants to say but she can not get it out or she will says the wrong word. Her episode today lasted for approximately one hour. No associated HA, vertigo, double vision, difficulty swallowing, confusion, imbalance, or vision impairment. Stated that she takes her xarelto religiously. CT brain reviewed my myself showed no acute intracranial abnormality. Serologies are pending. Date last known well: uncertain  Time last known well: uncertain tPA Given: no, symptoms resolved   Past Medical History  Diagnosis Date  . GERD (gastroesophageal reflux disease)   . History of TIAs   . Diverticulosis   . PUD (peptic ulcer disease)   . PVD (peripheral vascular disease)   . Hyperlipidemia   . Anxiety   . Depression   . Blood transfusion     "w/1st miscarriage" (05/26/2013)  . COPD (chronic obstructive pulmonary disease)   . Hypertension   . PONV (postoperative nausea and vomiting)   . Family history of anesthesia complication     "sisters also get sick as a dog" (05/26/2013)  . Coronary artery disease   . Heart murmur     "when I was a child" (05/26/2013)  . DVT (deep venous thrombosis) 1954    "after I had my first child" (05/26/2013)  . Pneumonia 2013    "once" (05/26/2013)  . Exertional shortness of breath   .  H/O hiatal hernia   . Stroke 10/2002    no residual  . Stroke 05/26/2013    "very mild; affected my speech for a while" (05/26/2013)  . Arthritis     "lots; hands" (05/26/2013)  . 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 Lymphona, mantle cell, inguinal region/lower limb     "large c-cell" (05/26/2013)    Past Surgical History  Procedure Laterality Date  . 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 Tubal ligation  1972  . Radical neck dissection  1986    "larynx cancer" (05/26/2013)  . Total knee arthroplasty Bilateral     2002 left 2008 right  . Ileostomy closure  01/2010  . Cataract extraction  2005    bilateral  . Carotid  endarterectomy Bilateral 2004    2004 a month apart right and left  . Tonsillectomy and adenoidectomy  1936    "tonsils grew back; no 2nd OR" (05/26/2013)  . Hernia repair  05/2010    ventral hernia repair  . Carpal tunnel release Left ?1960's  . Abdominal exploration surgery  12/01/2012  . Appendectomy  09/13/2008  . Colon surgery  09/13/2008    Laparoscopic assisted converted to open sigmoid colectomy.Archie Endo 09/13/2008 (05/26/2013)  . Cataract extraction w/ intraocular lens  implant, bilateral Bilateral ~ 2003  . Dilation and curettage of uterus  1950's    "had 2 for miscarriages" (05/26/2013)    Family History  Problem Relation Age of Onset  . Colon cancer Sister     colon  . Hyperlipidemia Mother   . Stroke Mother   . Cancer Father     mesothelioma  Family history: no brain tumor, epilepsy, or brain aneurysm. Social History:  reports that she quit smoking about 29 years ago. Her smoking use included Cigarettes. She has a 92.5 pack-year smoking history. She has never used smokeless tobacco. She reports that she does not drink alcohol or use illicit drugs.  Allergies:  Allergies  Allergen Reactions  . Ativan [Lorazepam] Other (See Comments)    Severe  hallucinations   . Codeine Itching  . Propoxyphene Hcl     Rxn: unknown  . Vancomycin     unknown  . Penicillins Rash    Medications:                                                                                                                           I have reviewed the patient's current medications.  ROS:                                                                                                                                       History obtained from the patient, family, and chart review  General ROS: negative for - chills, fatigue, fever, night sweats, weight gain or weight loss Psychological ROS: negative for - behavioral disorder, hallucinations, memory difficulties, mood swings or suicidal ideation Ophthalmic ROS: negative for - blurry vision, double vision, eye pain or loss of vision ENT ROS: negative for - epistaxis, nasal discharge, oral lesions, sore throat, tinnitus or vertigo Allergy and Immunology ROS: negative for - hives or itchy/watery eyes Hematological and  Lymphatic ROS: negative for - bleeding problems, bruising or swollen lymph nodes Endocrine ROS: negative for - galactorrhea, hair pattern changes, polydipsia/polyuria or temperature intolerance Respiratory ROS: negative for - cough, hemoptysis, shortness of breath or wheezing Cardiovascular ROS: negative for - chest pain, dyspnea on exertion, edema or irregular heartbeat Gastrointestinal ROS: negative for - abdominal pain, diarrhea, hematemesis, nausea/vomiting or stool incontinence Genito-Urinary ROS: negative for - dysuria, hematuria, incontinence or urinary frequency/urgency Musculoskeletal ROS: negative for - joint swelling or muscular weakness Neurological ROS: as noted in HPI Dermatological ROS: negative for rash and skin lesion changes  Physical exam: pleasant female in no apparent distress. Blood pressure 151/93, pulse 74, temperature 97.5 F (36.4 C), temperature source Oral, resp. rate 16,  SpO2 96 %. Head: normocephalic. Neck: supple, no bruits, no JVD. Cardiac: no murmurs. Lungs: clear. Abdomen: soft, no tender, no mass. Extremities: no edema. Neurologic Examination:                                                                                                      General: Mental Status: Alert, oriented, thought content appropriate.  Speech fluent without evidence of aphasia.  Able to follow 3 step commands without difficulty. Cranial Nerves: II: Discs flat bilaterally; Visual fields grossly normal, pupils equal, round, reactive to light and accommodation III,IV, VI: ptosis not present, extra-ocular motions intact bilaterally V,VII: smile symmetric, facial light touch sensation normal bilaterally VIII: hearing normal bilaterally IX,X: gag reflex present XI: bilateral shoulder shrug XII: midline tongue extension without atrophy or fasciculations Motor: Right : Upper extremity   5/5    Left:     Upper extremity   5/5  Lower extremity   5/5     Lower extremity   5/5 Tone and bulk:normal tone throughout; no atrophy noted Sensory: Pinprick and light touch intact throughout, bilaterally Deep Tendon Reflexes:  1+ all over Plantars: Right: downgoing   Left: downgoing Cerebellar: Mild bilateral intention tremor,  normal heel-to-shin test Gait:  No tested CV: pulses palpable throughout    Results for orders placed or performed during the hospital encounter of 08/05/14 (from the past 48 hour(s))  Protime-INR     Status: Abnormal   Collection Time: 08/05/14  4:33 PM  Result Value Ref Range   Prothrombin Time 15.4 (H) 11.6 - 15.2 seconds   INR 1.21 0.00 - 1.49  APTT     Status: None   Collection Time: 08/05/14  4:33 PM  Result Value Ref Range   aPTT 33 24 - 37 seconds  CBC     Status: None   Collection Time: 08/05/14  4:33 PM  Result Value Ref Range   WBC 5.9 4.0 - 10.5 K/uL   RBC 4.51 3.87 - 5.11 MIL/uL   Hemoglobin 12.7 12.0 - 15.0 g/dL   HCT 39.9 36.0 - 46.0  %   MCV 88.5 78.0 - 100.0 fL   MCH 28.2 26.0 - 34.0 pg   MCHC 31.8 30.0 - 36.0 g/dL   RDW 14.0 11.5 - 15.5 %   Platelets 186 150 - 400 K/uL  Differential  Status: None   Collection Time: 08/05/14  4:33 PM  Result Value Ref Range   Neutrophils Relative % 56 43 - 77 %   Neutro Abs 3.3 1.7 - 7.7 K/uL   Lymphocytes Relative 31 12 - 46 %   Lymphs Abs 1.8 0.7 - 4.0 K/uL   Monocytes Relative 9 3 - 12 %   Monocytes Absolute 0.5 0.1 - 1.0 K/uL   Eosinophils Relative 4 0 - 5 %   Eosinophils Absolute 0.2 0.0 - 0.7 K/uL   Basophils Relative 0 0 - 1 %   Basophils Absolute 0.0 0.0 - 0.1 K/uL  Comprehensive metabolic panel     Status: Abnormal   Collection Time: 08/05/14  4:33 PM  Result Value Ref Range   Sodium 139 135 - 145 mmol/L    Comment: Please note change in reference range.   Potassium 4.6 3.5 - 5.1 mmol/L    Comment: Please note change in reference range. DELTA CHECK NOTED    Chloride 106 96 - 112 mEq/L   CO2 23 19 - 32 mmol/L   Glucose, Bld 97 70 - 99 mg/dL   BUN 15 6 - 23 mg/dL   Creatinine, Ser 1.07 0.50 - 1.10 mg/dL   Calcium 9.6 8.4 - 10.5 mg/dL   Total Protein 7.4 6.0 - 8.3 g/dL   Albumin 4.1 3.5 - 5.2 g/dL   AST 20 0 - 37 U/L   ALT 17 0 - 35 U/L   Alkaline Phosphatase 74 39 - 117 U/L   Total Bilirubin 0.7 0.3 - 1.2 mg/dL   GFR calc non Af Amer 46 (L) >90 mL/min   GFR calc Af Amer 54 (L) >90 mL/min    Comment: (NOTE) The eGFR has been calculated using the CKD EPI equation. This calculation has not been validated in all clinical situations. eGFR's persistently <90 mL/min signify possible Chronic Kidney Disease.    Anion gap 10 5 - 15   Ct Head (brain) Wo Contrast  08/05/2014   CLINICAL DATA:  Aphasia.  EXAM: CT HEAD WITHOUT CONTRAST  TECHNIQUE: Contiguous axial images were obtained from the base of the skull through the vertex without intravenous contrast.  COMPARISON:  CT scan of June 26, 2013.  FINDINGS: Bony calvarium appears intact. Mild diffuse cortical  atrophy is noted. Moderate chronic ischemic white matter disease is noted. No mass effect or midline shift is noted. Ventricular size is within normal limits. There is no evidence of mass lesion, hemorrhage or acute infarction.  IMPRESSION: Mild diffuse cortical atrophy. Moderate chronic ischemic white matter disease. No acute intracranial abnormality seen.   Electronically Signed   By: Sabino Dick M.D.   On: 08/05/2014 17:04     Assessment: 81 y.o. female with multiple risk factors for stroke, comes in with complains of recurrent episodes of transient expressive dysphasia. Non focal neuro-exam at this time. CT brain showed no acute abnormality. Concern for crescendo TIA's versus cerebral infarct left cortical. Ordered MRI brain. Patient can be discharged home if MRI negative. If evidence of stroke, admit to medicine for observation, no need to repeat stroke work up as this was completed 11/15 and patient already on xarelto. Will need sedation prior MRI (CAN NOT TAKE ATIVAN). Will follow up.  Dorian Pod ,MD [facility] [phone]  08/05/2014, 5:37 PM

## 2014-08-05 NOTE — ED Notes (Signed)
MRI called, pt needing sedation for procedure.

## 2014-08-05 NOTE — ED Provider Notes (Signed)
CSN: [id number]     Arrival date & time 08/05/14  1540 History   First MD Initiated Contact with Patient 08/05/14 1616     Chief Complaint  Patient presents with  . Aphasia     (Consider location/radiation/quality/duration/timing/severity/associated sxs/prior Treatment) HPI Comments: Patient with history of TIAs stroke, atrial  fibrillation, high blood pressure, lipids presents with aphasia. Patient had episode 2 days prior lasting approximately one hour and then had another episode lasting 1.5 hours prior to arrival that resolved. Patient currently has no signs or symptoms. This is similar to previous. Patient was recent change to Xarelto, was on Coumadin in the past. Patient had recent stroke workup. Currently patient feels fine.  The history is provided by the patient and a relative.    Past Medical History  Diagnosis Date  . GERD (gastroesophageal reflux disease)   . History of TIAs   . Diverticulosis   . PUD (peptic ulcer disease)   . PVD (peripheral vascular disease)   . Hyperlipidemia   . Anxiety   . Depression   . Blood transfusion     "w/1st miscarriage" (05/26/2013)  . COPD (chronic obstructive pulmonary disease)   . Hypertension   . PONV (postoperative nausea and vomiting)   . Family history of anesthesia complication     "sisters also get sick as a dog" (05/26/2013)  . Coronary artery disease   . Heart murmur     "when I was a child" (05/26/2013)  . DVT (deep venous thrombosis) 1954    "after I had my first child" (05/26/2013)  . Pneumonia 2013    "once" (05/26/2013)  . Exertional shortness of breath   . H/O hiatal hernia   . Stroke 10/2002    no residual  . Stroke 05/26/2013    "very mild; affected my speech for a while" (05/26/2013)  . Arthritis     "lots; hands" (05/26/2013)  . 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 Lymphona, mantle cell, inguinal region/lower limb     "large c-cell" (05/26/2013)   Past  Surgical History  Procedure Laterality Date  . 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 Tubal ligation  1972  . Radical neck dissection  1986    "larynx cancer" (05/26/2013)  . Total knee arthroplasty Bilateral     2002 left 2008 right  . Ileostomy closure  01/2010  . Cataract extraction  2005    bilateral  . Carotid endarterectomy Bilateral 2004    2004 a month apart right and left  . Tonsillectomy and adenoidectomy  1936    "tonsils grew back; no 2nd OR" (05/26/2013)  . Hernia repair  05/2010    ventral hernia repair  . Carpal tunnel release Left ?1960's  . Abdominal exploration surgery  12/01/2012  . Appendectomy  09/13/2008  . Colon surgery  09/13/2008    Laparoscopic assisted converted to open sigmoid colectomy.Archie Endo 09/13/2008 (05/26/2013)  . Cataract extraction w/ intraocular lens  implant, bilateral Bilateral ~ 2003  . Dilation and curettage of uterus  1950's    "had 2 for miscarriages" (05/26/2013)   Family History  Problem Relation Age of Onset  . Colon cancer Sister     colon  . Hyperlipidemia Mother   . Stroke Mother   . Cancer Father     mesothelioma   History  Substance Use Topics  . Smoking status: Former Smoker -- 2.50 packs/day for 37 years  Types: Cigarettes    Quit date: 09/05/1984  . Smokeless tobacco: Never Used  . Alcohol Use: No   OB History    No data available     Review of Systems  Constitutional: Negative for fever and chills.  HENT: Negative for congestion.   Eyes: Negative for visual disturbance.  Respiratory: Negative for shortness of breath.   Cardiovascular: Negative for chest pain.  Gastrointestinal: Negative for vomiting and abdominal pain.  Genitourinary: Negative for dysuria and flank pain.  Musculoskeletal: Negative for back pain, neck pain and neck stiffness.  Skin: Negative for rash.  Neurological: Positive for speech difficulty. Negative for weakness, light-headedness  and headaches.      Allergies  Ativan; Codeine; Propoxyphene hcl; Vancomycin; and Penicillins  Home Medications   Prior to Admission medications   Medication Sig Start Date End Date Taking? Authorizing Provider  acetaminophen (TYLENOL) 650 MG CR tablet Take 650 mg by mouth every 8 (eight) hours as needed for pain.   Yes Historical Provider, MD  ALPRAZolam Duanne Moron) 0.5 MG tablet TAKE ONE TABLET BY MOUTH AT BEDTIME AS NEEDED FOR SLEEP 03/20/14  Yes Ricard Dillon, MD  bisoprolol (ZEBETA) 5 MG tablet TAKE ONE TABLET BY MOUTH ONCE DAILY   Yes Ricard Dillon, MD  cholecalciferol (VITAMIN D) 1000 UNITS tablet Take 1,000 Units by mouth daily.   Yes Historical Provider, MD  diclofenac sodium (VOLTAREN) 1 % GEL Apply 2 g topically 4 (four) times daily. 01/22/14  Yes Timoteo Gaul, FNP  gabapentin (NEURONTIN) 300 MG capsule nightly Patient taking differently: Take 300 mg by mouth at bedtime. nightly 05/09/14  Yes Lyndal Pulley, DO  Glucosamine HCl (GLUCOSAMINE PO) Take 1 tablet by mouth 2 (two) times daily.   Yes Historical Provider, MD  levETIRAcetam (KEPPRA) 750 MG tablet TAKE ONE TABLET BY MOUTH TWICE DAILY 01/26/14  Yes Adam Melvern Sample, DO  loperamide (IMODIUM) 2 MG capsule Take 2 mg by mouth daily as needed for diarrhea or loose stools.    Yes Historical Provider, MD  Rivaroxaban (XARELTO) 15 MG TABS tablet Take 1 tablet (15 mg total) by mouth daily with supper. 07/25/14  Yes Marin Olp, MD   BP 161/74 mmHg  Pulse 69  Temp(Src) 97.5 F (36.4 C) (Oral)  Resp 20  SpO2 95% Physical Exam  Constitutional: She is oriented to person, place, and time. She appears well-developed and well-nourished.  HENT:  Head: Normocephalic and atraumatic.  Eyes: Conjunctivae are normal. Right eye exhibits no discharge. Left eye exhibits no discharge.  Neck: Normal range of motion. Neck supple. No tracheal deviation present.  Cardiovascular: Normal rate and regular rhythm.   Pulmonary/Chest: Effort  normal and breath sounds normal.  Abdominal: Soft. She exhibits no distension. There is no tenderness. There is no guarding.  Musculoskeletal: She exhibits no edema.  Neurological: She is alert and oriented to person, place, and time. Coordination normal. GCS eye subscore is 4. GCS verbal subscore is 5. GCS motor subscore is 6.  5+ strength in UE and LE with f/e at major joints. Sensation to palpation intact in UE and LE. CNs 2-12 grossly intact.  EOMFI.  PERRL.   Finger nose and coordination intact bilateral.   Visual fields intact to finger testing.   Skin: Skin is warm. No rash noted.  Psychiatric: She has a normal mood and affect.  Nursing note and vitals reviewed.   ED Course  Procedures (including critical care time) Labs Review Labs Reviewed  PROTIME-INR - Abnormal;  Notable for the following:    Prothrombin Time 15.4 (*)    All other components within normal limits  COMPREHENSIVE METABOLIC PANEL - Abnormal; Notable for the following:    GFR calc non Af Amer 46 (*)    GFR calc Af Amer 54 (*)    All other components within normal limits  URINALYSIS, ROUTINE W REFLEX MICROSCOPIC - Abnormal; Notable for the following:    APPearance CLOUDY (*)    All other components within normal limits  APTT  CBC  DIFFERENTIAL  CBG MONITORING, ED  I-STAT TROPOININ, ED    Imaging Review Ct Head (brain) Wo Contrast  08/05/2014   CLINICAL DATA:  Aphasia.  EXAM: CT HEAD WITHOUT CONTRAST  TECHNIQUE: Contiguous axial images were obtained from the base of the skull through the vertex without intravenous contrast.  COMPARISON:  CT scan of June 26, 2013.  FINDINGS: Bony calvarium appears intact. Mild diffuse cortical atrophy is noted. Moderate chronic ischemic white matter disease is noted. No mass effect or midline shift is noted. Ventricular size is within normal limits. There is no evidence of mass lesion, hemorrhage or acute infarction.  IMPRESSION: Mild diffuse cortical atrophy. Moderate  chronic ischemic white matter disease. No acute intracranial abnormality seen.   Electronically Signed   By: Sabino Dick M.D.   On: 08/05/2014 17:04   Mr Brain Wo Contrast  08/05/2014   CLINICAL DATA:  Recurrent language impairment. Evaluate for stroke. Initial evaluation.  EXAM: MRI HEAD WITHOUT CONTRAST  TECHNIQUE: Multiplanar, multiecho pulse sequences of the brain and surrounding structures were obtained without intravenous contrast.  COMPARISON:  Prior CT performed earlier on the same day.  FINDINGS: Diffuse prominence of the CSF containing spaces is compatible with generalized cerebral atrophy. Patchy and confluent T2/FLAIR hyperintensity within the periventricular and deep white matter both cerebral hemispheres most consistent with moderate chronic small vessel ischemic disease. Multiple scattered remote lacunar infarct present within the basal ganglia and corona radiata.  No abnormal foci of restricted diffusion to suggest acute intracranial infarct. Gray-white matter differentiation maintained. Normal intravascular flow voids present. No intracranial hemorrhage.  Craniocervical junction within normal limits. Degenerative changes present within the upper cervical spine. There is 4 mm of anterolisthesis of C3 on C4, stable.  Pituitary gland within normal limits. No acute abnormality seen about the orbits.  Paranasal sinuses and mastoid air cells are clear.  Number signal intensity within normal limits. No acute abnormality seen within the scalp soft tissues.  IMPRESSION: 1. No acute intracranial infarct or other process identified. 2. Atrophy with moderate chronic microvascular ischemic disease and remote lacunar infarcts involving the white matter of the corona radiata/deep white matter bilaterally.   Electronically Signed   By: Jeannine Boga M.D.   On: 08/05/2014 22:50     EKG Interpretation   Date/Time:  Sunday August 05 2014 15:48:44 EST Ventricular Rate:  76 PR Interval:  178 QRS  Duration: 82 QT Interval:  398 QTC Calculation: 447 R Axis:   60 Text Interpretation:  Normal sinus rhythm artifact/ poor baseline inferior  Confirmed by Mauri Tolen  MD, Neil Errickson ([id number]) on 08/05/2014 6:27:11 PM      MDM   Final diagnoses:  Expressive aphasia   Patient was stroke history presents after episode of aphasia. Normal neuro exam on arrival. With recurrent episodes that resolved and on Xarelto patient does not meet criteria for code stroke. Neurology consult at, recommended MRI and if unremarkable follow-up outpatient as patient on maximum medical therapy and normal neuro  exam.  MRI results reviewed no acute stroke, old strokes visualized. Outpatient follow-up recommended by neurology. Results and differential diagnosis were discussed with the patient/parent/guardian. Close follow up outpatient was discussed, comfortable with the plan.   Medications  diazepam (VALIUM) injection 5 mg (5 mg Intravenous Given 08/05/14 2059)    Filed Vitals:   08/05/14 1732 08/05/14 1815 08/05/14 1945 08/05/14 1954  BP:  181/97 161/74   Pulse:  79 72 69  Temp:      TempSrc:      Resp:  18 14 20   SpO2: 96% 93%  95%    Final diagnoses:  Expressive aphasia       Mariea Clonts, MD 08/05/14 2307

## 2014-08-05 NOTE — Discharge Instructions (Signed)
If you were given medicines take as directed.  If you are on coumadin or contraceptives realize their levels and effectiveness is altered by many different medicines.  If you have any reaction (rash, tongues swelling, other) to the medicines stop taking and see a physician.  Take medicines as previously directed. Please follow up as directed and return to the ER or see a physician for new or worsening symptoms.  Thank you. Filed Vitals:   08/05/14 1714 08/05/14 1730 08/05/14 1732 08/05/14 1815  BP: 159/70 151/93  181/97  Pulse:  74  79  Temp:      TempSrc:      Resp:  16  18  SpO2: 94% 91% 96% 93%

## 2014-08-06 ENCOUNTER — Telehealth: Payer: Self-pay | Admitting: Family Medicine

## 2014-08-06 NOTE — Telephone Encounter (Signed)
Pt husband called to say that wife is set up for a Echo cardio  on 1//8/16 and is asking if pt need to keep this appt since she had this done in the hospital over the weekend.

## 2014-08-06 NOTE — Telephone Encounter (Signed)
Dr. Hunter please advise. 

## 2014-08-06 NOTE — Telephone Encounter (Signed)
It appears she had an EKG not an echocardiogram-she needs this test. Tell patient this information and husband only if DPR on file.

## 2014-08-07 ENCOUNTER — Ambulatory Visit: Payer: Medicare Other | Admitting: Family Medicine

## 2014-08-07 ENCOUNTER — Ambulatory Visit (INDEPENDENT_AMBULATORY_CARE_PROVIDER_SITE_OTHER): Payer: Medicare Other | Admitting: Neurology

## 2014-08-07 ENCOUNTER — Encounter: Payer: Self-pay | Admitting: Neurology

## 2014-08-07 VITALS — BP 138/56 | HR 58 | Temp 97.6°F | Resp 16 | Ht 64.5 in | Wt 191.2 lb

## 2014-08-07 DIAGNOSIS — G458 Other transient cerebral ischemic attacks and related syndromes: Secondary | ICD-10-CM

## 2014-08-07 DIAGNOSIS — R4701 Aphasia: Secondary | ICD-10-CM

## 2014-08-07 DIAGNOSIS — Z8673 Personal history of transient ischemic attack (TIA), and cerebral infarction without residual deficits: Secondary | ICD-10-CM

## 2014-08-07 DIAGNOSIS — I4891 Unspecified atrial fibrillation: Secondary | ICD-10-CM

## 2014-08-07 NOTE — Progress Notes (Signed)
NEUROLOGY FOLLOW UP OFFICE NOTE  [name] [id number]  HISTORY OF PRESENT ILLNESS: [name] is an 81 year old right-handed woman with history of hypertension, atrial fibrillation, peripheral vascular disease, anxiety, depression, B Cell lymphoma status post chemotherapy, and COPD who follows up for cardio-embolic stroke and seizure secondary to stroke.  She is accompanied by her husband and grandson who provide some history.  ED records, labs and MRI reviewed.  UPDATE: She presented to the ED on 08/05/14 for recurrent episode of aphasia, lasting 1.5 hours.  This was more significant than last year.  She described it as knowing what she wanted to say but having trouble getting the words out.  She had trouble writing as well.  MRI of brain revealed no acute infarct.  She had an episode two days prior, lasting 1 hour.  HISTORY: Since childhood, she has episodes of numbness in either of both hands and inability to get words out.  It lasts for about 2 minutes.  It should be known that she has had these episodes since childhood.  Sometimes it was associated with headache.  She had them about twice a year until young adulthood, when they resolved.  Last fall, she had several severe episodes and was started on Keppra at that time, for possible seizures.    She has had several episodes of confusion and aphasia in October and November of 2014, associated with stroke and presumed seizure.  On 05/26/13, MRI of the brain was performed, which revealed an acute lacunar infarct in the left anterior corona radiata and lentiform nucleus.  CTA of the head revealed atherosclerotic changes in the cavernous carotid arteries without significant stenosis.  CTA of neck revealed focal 50% stenosis of mid right ICA.  She was switched from [name] to Eliquis.  She was readmitted to the hospital on 05/31/13 with recurrent aphasia.  She had trouble getting her words out and then couldn't speak at all.  Repeat MRI  of brain revealed new punctate infarct in the left posterior corona radiata, the same location as previous infarct. No changes in management were made.  She was readmitted to the hospital on 06/07/13 for altered mental status.  EEG was performed, showing left fronto-parieto-temporal theta slowing.  Due to possibility of seizure, she was started on Keppra 250mg  BID.  No change made to Eliquis.  She presented to the ED on 06/16/13 after waking up in the morning with expressive aphasia and right-sided weakness.  Symptoms improved in the ED.  CT head revealed prior lacunar infarct in left lentiform nucleus and corona radiata, but no new abnormalities.  It was suspected that she may have had another seizure and her Keppra was increased to 500mg  BID.  She was admitted to the hospital again on 06/21/13 again for expressive aphasia.  MRI brain revealed 2 new punctate infarcts in the bilateral hemispheres at level of centrum semiovale.  Eliquis was changed to Xarelto. Keppra was increased to 750mg  BID.    Due to stroke and pneumonia, she had undergone deconditioning so she started physical therapy.  She has a walker but doesn't use it all the time.  She does not have a walker now.  She has history of gait problems after knee surgery, but she has been more unsteady since her prolonged hospital admissions.  She has history of numbness on the bottom of her feet.  Retinal migraines:  Of note, she has longstanding history of migraines, described as scotomas, fortification spectrum and flashing lights in  the right eye.  There is no associated headache.  Currently, she remains on Keppra 750mg  twice daily, Xarelto, Zebeta.  PAST MEDICAL HISTORY: Past Medical History  Diagnosis Date  . GERD (gastroesophageal reflux disease)   . History of TIAs   . Diverticulosis   . PUD (peptic ulcer disease)   . PVD (peripheral vascular disease)   . Hyperlipidemia   . Anxiety   . Depression   . Blood transfusion     "w/1st  miscarriage" (05/26/2013)  . COPD (chronic obstructive pulmonary disease)   . Hypertension   . PONV (postoperative nausea and vomiting)   . Family history of anesthesia complication     "sisters also get sick as a dog" (05/26/2013)  . Coronary artery disease   . Heart murmur     "when I was a child" (05/26/2013)  . DVT (deep venous thrombosis) 1954    "after I had my first child" (05/26/2013)  . Pneumonia 2013    "once" (05/26/2013)  . Exertional shortness of breath   . H/O hiatal hernia   . Stroke 10/2002    no residual  . Stroke 05/26/2013    "very mild; affected my speech for a while" (05/26/2013)  . Arthritis     "lots; hands" (05/26/2013)  . 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 Lymphona, mantle cell, inguinal region/lower limb     "large c-cell" (05/26/2013)    MEDICATIONS: Current Outpatient Prescriptions on File Prior to Visit  Medication Sig Dispense Refill  . acetaminophen (TYLENOL) 650 MG CR tablet Take 650 mg by mouth every 8 (eight) hours as needed for pain.    Marland Kitchen ALPRAZolam (XANAX) 0.5 MG tablet TAKE ONE TABLET BY MOUTH AT BEDTIME AS NEEDED FOR SLEEP 30 tablet 5  . bisoprolol (ZEBETA) 5 MG tablet TAKE ONE TABLET BY MOUTH ONCE DAILY 30 tablet 11  . cholecalciferol (VITAMIN D) 1000 UNITS tablet Take 1,000 Units by mouth daily.    . diclofenac sodium (VOLTAREN) 1 % GEL Apply 2 g topically 4 (four) times daily. 1 Tube 1  . gabapentin (NEURONTIN) 300 MG capsule nightly (Patient taking differently: Take 300 mg by mouth at bedtime. nightly) 90 capsule 3  . Glucosamine HCl (GLUCOSAMINE PO) Take 1 tablet by mouth 2 (two) times daily.    Marland Kitchen levETIRAcetam (KEPPRA) 750 MG tablet TAKE ONE TABLET BY MOUTH TWICE DAILY 60 tablet 0  . loperamide (IMODIUM) 2 MG capsule Take 2 mg by mouth daily as needed for diarrhea or loose stools.     . Rivaroxaban (XARELTO) 15 MG TABS tablet Take 1 tablet (15 mg total) by mouth daily with supper. 30  tablet 2   No current facility-administered medications on file prior to visit.    ALLERGIES: Allergies  Allergen Reactions  . Ativan [Lorazepam] Other (See Comments)    Severe hallucinations   . Codeine Itching  . Propoxyphene Hcl     Rxn: unknown  . Vancomycin     unknown  . Penicillins Rash    FAMILY HISTORY: Family History  Problem Relation Age of Onset  . Colon cancer Sister     colon  . Hyperlipidemia Mother   . Stroke Mother   . Cancer Father     mesothelioma    SOCIAL HISTORY: History   Social History  . Marital Status: Married    Spouse Name: N/A    Number of Children: 6  . Years of Education: N/A  Occupational History  . retired    Social History Main Topics  . Smoking status: Former Smoker -- 2.50 packs/day for 37 years    Types: Cigarettes    Quit date: 09/05/1984  . Smokeless tobacco: Never Used  . Alcohol Use: No  . Drug Use: No  . Sexual Activity: No   Other Topics Concern  . Not on file   Social History Narrative    REVIEW OF SYSTEMS: Constitutional: No fevers, chills, or sweats, no generalized fatigue, change in appetite Eyes: No visual changes, double vision, eye pain Ear, nose and throat: No hearing loss, ear pain, nasal congestion, sore throat Cardiovascular: No chest pain, palpitations Respiratory:  No shortness of breath at rest or with exertion, wheezes GastrointestinaI: No nausea, vomiting, diarrhea, abdominal pain, fecal incontinence Genitourinary:  No dysuria, urinary retention or frequency Musculoskeletal:  No neck pain, back pain Integumentary: No rash, pruritus, skin lesions Neurological: as above Psychiatric: No depression, insomnia, anxiety Endocrine: No palpitations, fatigue, diaphoresis, mood swings, change in appetite, change in weight, increased thirst Hematologic/Lymphatic:  No anemia, purpura, petechiae. Allergic/Immunologic: no itchy/runny eyes, nasal congestion, recent allergic reactions, rashes  PHYSICAL  EXAM: Filed Vitals:   08/07/14 1404  BP: 138/56  Pulse: 58  Temp: 97.6 F (36.4 C)  Resp: 16   General: No acute distress Head:  Normocephalic/atraumatic Eyes:  Fundoscopic exam unremarkable without vessel changes, exudates, hemorrhages or papilledema. Neck: supple, no paraspinal tenderness, full range of motion Heart:  Regular rate and rhythm Lungs:  Clear to auscultation bilaterally Back: No paraspinal tenderness Neurological Exam: alert and oriented to person, place, and time. Attention span and concentration intact, recent and remote memory intact, fund of knowledge intact.  Speech fluent and not dysarthric, language intact.  CN II-XII intact. Fundi not visualized on inspection.  Bulk and tone normal, muscle strength 5/5 in upper extremities and 5-/5 lower extremities.  Deep tendon reflexes 2+ throughout, toes downgoing.  Finger to nose testing with postural and kinetic tremor but no dysmetria.  Gait mildly wide-based   IMPRESSION: TIA presenting as expressive aphasia Recurrent aphasia.  Possibly cerebrovascular versus simple partial seizure History of cardioembolic stroke secondary to atrial fibrillation  PLAN: 1.  Continue Xarelto and Keppra 750mg  twice daily. 2.  Check carotid doppler 3.  Check lipid panel 4.  Follow up in 3 months.  Metta Clines, DO  CC: [name], MD

## 2014-08-07 NOTE — Patient Instructions (Signed)
1.  Continue current medications at this time 2.  We will check carotid doppler 3.  We will check lipid panel 4.  Follow up in 3 months.

## 2014-08-09 LAB — LIPID PANEL
CHOL/HDL RATIO: 5.2 ratio
CHOLESTEROL: 170 mg/dL (ref 0–200)
HDL: 33 mg/dL — AB (ref >39)
LDL Cholesterol: 90 mg/dL (ref 0–99)
TRIGLYCERIDES: 235 mg/dL — AB (ref <150)
VLDL: 47 mg/dL — AB (ref 0–40)

## 2014-08-10 ENCOUNTER — Other Ambulatory Visit (HOSPITAL_COMMUNITY): Payer: Self-pay | Admitting: Internal Medicine

## 2014-08-10 ENCOUNTER — Ambulatory Visit (HOSPITAL_COMMUNITY)
Admission: RE | Admit: 2014-08-10 | Discharge: 2014-08-10 | Disposition: A | Discharge status: Home / Self Care | Payer: Medicare Other | Source: Ambulatory Visit | Attending: Family Medicine | Admitting: Family Medicine

## 2014-08-10 ENCOUNTER — Other Ambulatory Visit (HOSPITAL_COMMUNITY): Payer: Medicare Other

## 2014-08-10 ENCOUNTER — Other Ambulatory Visit (HOSPITAL_COMMUNITY): Payer: Self-pay | Admitting: Family Medicine

## 2014-08-10 ENCOUNTER — Telehealth: Payer: Self-pay | Admitting: *Deleted

## 2014-08-10 ENCOUNTER — Ambulatory Visit (HOSPITAL_BASED_OUTPATIENT_CLINIC_OR_DEPARTMENT_OTHER): Payer: Medicare Other | Admitting: Radiology

## 2014-08-10 DIAGNOSIS — R011 Cardiac murmur, unspecified: Secondary | ICD-10-CM

## 2014-08-10 DIAGNOSIS — X58XXXA Exposure to other specified factors, initial encounter: Secondary | ICD-10-CM | POA: Diagnosis not present

## 2014-08-10 DIAGNOSIS — T670XXA Heatstroke and sunstroke, initial encounter: Secondary | ICD-10-CM | POA: Insufficient documentation

## 2014-08-10 DIAGNOSIS — I6300 Cerebral infarction due to thrombosis of unspecified precerebral artery: Secondary | ICD-10-CM

## 2014-08-10 DIAGNOSIS — T6701XA Heatstroke and sunstroke, initial encounter: Secondary | ICD-10-CM

## 2014-08-10 NOTE — Progress Notes (Signed)
VASCULAR LAB PRELIMINARY  PRELIMINARY  PRELIMINARY  PRELIMINARY  Carotid doppler completed.    Preliminary report:  Carotid findings suggestive of 0-39% stenosis.   Lindwood Coke, RVT 08/10/2014, 11:36 AM

## 2014-08-10 NOTE — Telephone Encounter (Signed)
-----   Message from Dudley Major, DO sent at 08/10/2014  6:37 AM EST ----- LDL at goal of less than 100.  However, she has elevated triglycerides which may need to be addressed.  She should contact her PCP. ----- Message -----    From: Lab in [place]: 08/09/2014  11:44 AM      To: Dudley Major, DO

## 2014-08-10 NOTE — Progress Notes (Signed)
Echocardiogram performed.  

## 2014-08-10 NOTE — Telephone Encounter (Signed)
Patient is aware of elevated labs and will consult with PCP regarding that

## 2014-08-13 ENCOUNTER — Other Ambulatory Visit (HOSPITAL_COMMUNITY): Payer: Medicare Other

## 2014-08-13 DIAGNOSIS — I6300 Cerebral infarction due to thrombosis of unspecified precerebral artery: Secondary | ICD-10-CM | POA: Insufficient documentation

## 2014-08-16 ENCOUNTER — Telehealth: Payer: Self-pay | Admitting: Oncology

## 2014-08-16 ENCOUNTER — Telehealth: Payer: Self-pay | Admitting: Nurse Practitioner

## 2014-08-16 ENCOUNTER — Ambulatory Visit (HOSPITAL_BASED_OUTPATIENT_CLINIC_OR_DEPARTMENT_OTHER): Payer: Medicare Other | Admitting: Nurse Practitioner

## 2014-08-16 ENCOUNTER — Encounter: Payer: Self-pay | Admitting: Nurse Practitioner

## 2014-08-16 VITALS — BP 125/61 | HR 80 | Temp 97.8°F | Resp 18 | Ht 64.5 in | Wt 190.0 lb

## 2014-08-16 DIAGNOSIS — C8330 Diffuse large B-cell lymphoma, unspecified site: Secondary | ICD-10-CM

## 2014-08-16 DIAGNOSIS — C8510 Unspecified B-cell lymphoma, unspecified site: Secondary | ICD-10-CM

## 2014-08-16 DIAGNOSIS — R200 Anesthesia of skin: Secondary | ICD-10-CM

## 2014-08-16 NOTE — Telephone Encounter (Signed)
Closed encounter °

## 2014-08-16 NOTE — Progress Notes (Signed)
[facility] OFFICE PROGRESS NOTE   Diagnosis:  Non-Hodgkin's lymphoma  INTERVAL HISTORY:   [name] returns as scheduled. She reports having a TIA earlier this year. She was evaluated in the emergency department. She is following up with neurology. No fevers or sweats. She has a good appetite. No nausea or vomiting. She notes her balance is worse. She continues physical therapy.  Objective:  Vital signs in last 24 hours:  Blood pressure 125/61, pulse 80, temperature 97.8 F (36.6 C), temperature source Oral, resp. rate 18, height 5' 4.5" (1.638 m), weight 190 lb (86.183 kg).    HEENT: No thrush or ulcers. Lymphatics: No palpable cervical, supraclavicular, axillary or inguinal lymph nodes. Resp: Lungs clear bilaterally. Cardio: Regular rate and rhythm. GI: Abdomen soft. No organomegaly. No mass. Vascular: No leg edema.   Lab Results:  Lab Results  Component Value Date   WBC 5.9 08/05/2014   HGB 12.7 08/05/2014   HCT 39.9 08/05/2014   MCV 88.5 08/05/2014   PLT 186 08/05/2014   NEUTROABS 3.3 08/05/2014    Imaging:  No results found.  Medications: I have reviewed the patient's current medications.  Assessment/Plan: 1. Non-Hodgkin's lymphoma, large B-cell lymphoma, CD20 positive involving 8 needle core biopsy of a right iliac lymph node 02/12/2012. CT of the abdomen and pelvis on 01/15/2012 showed abdominal/pelvic lymphadenopathy. Serum LDH in normal range on 02/25/2012. Staging PET scan 02/25/2012 with a small lymph node posteriolateral to the oropharynx on the left measuring 9 x 7 mm with SUV 5.4; large cluster of hypermetabolic lymph nodes in the right hilum with SUV 4.3; cluster of mildly enlarged hepatoduodenal ligament lymph nodes and mildly enlarged portacaval lymph node with hypermetabolic activity (SUV max 37.1); numerous borderline enlarged and mildly enlarged periaortic retroperitoneal lymph nodes with extensive hypermetabolic activity (SUV max  43.6-47.6); numerous borderline enlarged and mildly enlarged hypermetabolic lymph nodes along the pelvic sidewall bilaterally most pronounced on the right with a right external iliac lymph node measuring 2.9 x 4.1 cm (SUV max 52.5). Bone marrow aspiration/biopsy 03/09/2012 negative for involvement with lymphoma. Status post cycle 1 of CHOP-Rituxan on 03/15/2012. She completed cycle 3 on 05/04/2012 and cycle 4 on 05/25/2012. Restaging PET scan on 06/10/2012 was markedly improved with resolution of the thoracic and retroperitoneal adenopathy and near complete resolution of porta hepatis adenopathy. She completed cycle 6 of CHOP/Rituxan on 07/06/2012. 2. History of night sweats likely secondary to #1. 3. Rectal bleeding June 2013 status post negative colonoscopy 01/28/2012 and upper endoscopy 02/11/2012. The bleeding was likely related to hemorrhoids. 4. COPD. 5. Remote history of larynx cancer. 6. Status post Port-A-Cath placement 03/09/2012. Removed 09/13/2012. 7. 2-D echo 03/11/2012 with left ventricular ejection fraction 60-65%. 8. Admission with febrile neutropenia 03/22/2012 with no source for infection identified 9. Admission with fever and a chest x-ray consistent with "pneumonia"on 03/27/2012. She completed a course of Levaquin. 10. Pancytopenia following cycle 1 of CHOP-rituximab-resolved. 11. History of atrial fibrillation-maintained on xarelto 12. Numbness/tingling in the fingertips and toes. The numbness in the toes predated the start of chemotherapy.. She also reports the tingling in the fingertips was present prior to the start of chemotherapy. The vincristine was dose reduced with cycle 2. Vincristine was discontinued beginning with cycle 3. 13. Cardioembolic strokes, seizures 2014. She is followed by neurology. 52. Hospitalization with pneumonia November 2014.   Disposition: [name] remains in clinical remission from non-Hodgkin's lymphoma. She will return for a follow-up visit in 8  months. She will contact the office  in the interim with any problems.  Plan reviewed with Dr. Benay Spice.    [name] ANP/GNP-BC   08/16/2014  2:35 PM

## 2014-08-16 NOTE — Telephone Encounter (Signed)
Pt confirmed labs/ov per 01/14 POF, gave pt AVS.... KJ °

## 2014-08-21 ENCOUNTER — Telehealth: Payer: Self-pay | Admitting: Neurology

## 2014-08-21 NOTE — Telephone Encounter (Signed)
Patient is requesting results from doppler on 08/10/14

## 2014-08-21 NOTE — Telephone Encounter (Signed)
Wants the results of the test that was done on 08-07-14 pt phone number is [phone]

## 2014-08-21 NOTE — Telephone Encounter (Signed)
I don't see any results for carotid doppler.  There is a carotid doppler listed but it is the results of the echo that is under it.  Can we look into this?

## 2014-08-21 NOTE — Telephone Encounter (Signed)
Patient is aware of doppler at 39% I will mail a copy received

## 2014-08-30 ENCOUNTER — Encounter: Payer: Self-pay | Admitting: Family Medicine

## 2014-08-30 ENCOUNTER — Ambulatory Visit (INDEPENDENT_AMBULATORY_CARE_PROVIDER_SITE_OTHER): Payer: Medicare Other | Admitting: Family Medicine

## 2014-08-30 VITALS — BP 122/74 | Temp 98.0°F | Wt 196.0 lb

## 2014-08-30 DIAGNOSIS — R569 Unspecified convulsions: Secondary | ICD-10-CM

## 2014-08-30 DIAGNOSIS — E785 Hyperlipidemia, unspecified: Secondary | ICD-10-CM

## 2014-08-30 DIAGNOSIS — I480 Paroxysmal atrial fibrillation: Secondary | ICD-10-CM

## 2014-08-30 DIAGNOSIS — G458 Other transient cerebral ischemic attacks and related syndromes: Secondary | ICD-10-CM

## 2014-08-30 DIAGNOSIS — I10 Essential (primary) hypertension: Secondary | ICD-10-CM

## 2014-08-30 MED ORDER — ATORVASTATIN CALCIUM 20 MG PO TABS: 20.0000 mg | ORAL_TABLET | Freq: Every day | ORAL | Status: DC

## 2014-08-30 NOTE — Assessment & Plan Note (Signed)
Lipids reveal low HDL, LDL 90, triglycerides >200. Given possible ischemic strokes-try to push LDL below 70 and triglycerides >200 would qualify for statin regardless.

## 2014-08-30 NOTE — Assessment & Plan Note (Signed)
Follow up 1 week. Consider titrating to 1000mg  keppra as discussed with Dr. Tomi Likens. Further titration would be arranged through his office.

## 2014-08-30 NOTE — Assessment & Plan Note (Signed)
We had a long discussion that if these events are in fact TIAs there is little we can do from a anticoagulant perspective. She has failed coumadin, pradaxa, eliquis at this point. We will continue xarelto. Discussed case with Dr. Tomi Likens and no evidence that ASA would provide additional benefit.

## 2014-08-30 NOTE — Progress Notes (Signed)
[name], MD Phone: [phone]  Subjective:   [name] is a 81 y.o. year old very pleasant female patient who presents with the following:  expressive aphasia With ? Etiology due to TIA or seizures Blood pressure higher on day of event as had not taken blood pressure medicine. Diagnosed in ED with TIA and instructed to follow up with neurology after  MRI negative in ED. Workup after ED visit showed carotid dopplers with <40% plaque and Echo 08/10/14 EF 55%, mild aortic stenosis, moderate mitral regurg. Patient has bene continued on Xarelto for potential cardioembolic stroke with known paroxysmal atrial fibrillation. She is also taking keppra 750mg  BID for potential seizures as cause of expressive aphasia. We discussed risk factor of untreated dysplipidemia on no medication.   Since she saw neurology last, Expressive aphasia has occurred 7 more times in the last week. Yesterday for example was trying to say sausage and repeated 7 multiple times. 5 minutes later after calming down was able to say the word sausage. Lasting 10 minutes to half an hour at longest.   ROS- no extremity or facial weakness, no difficulty understanding  Past Medical History- Patient Active Problem List   Diagnosis Date Noted  . Hx of ischemic multifocal multiple vascular territories stroke 06/26/2013    Priority: High  . Focal seizure 06/16/2013    Priority: High  . TIA (transient ischemic attack) 05/31/2013    Priority: High  . Large B-cell lymphoma 02/18/2012    Priority: High  . Atrial fibrillation 12/14/2008    Priority: High  . Anxiety state 10/20/2008    Priority: Medium  . DEPRESSION 10/20/2008    Priority: Medium  . COPD 03/26/2008    Priority: Medium  . Hyperlipidemia 03/18/2007    Priority: Medium  . Essential hypertension 03/18/2007    Priority: Medium  . Spinal stenosis, lumbar region, with neurogenic claudication 05/09/2014    Priority: Low  . Weakness generalized  06/30/2013    Priority: Low  . Bilateral hand pain 01/09/2013    Priority: Low   Medications- reviewed and updated Current Outpatient Prescriptions  Medication Sig Dispense Refill  . bisoprolol (ZEBETA) 5 MG tablet TAKE ONE TABLET BY MOUTH ONCE DAILY 30 tablet 11  . cholecalciferol (VITAMIN D) 1000 UNITS tablet Take 1,000 Units by mouth daily.    Marland Kitchen gabapentin (NEURONTIN) 300 MG capsule nightly (Patient taking differently: Take 300 mg by mouth at bedtime. nightly) 90 capsule 3  . Glucosamine HCl (GLUCOSAMINE PO) Take 1 tablet by mouth 2 (two) times daily.    Marland Kitchen levETIRAcetam (KEPPRA) 750 MG tablet TAKE ONE TABLET BY MOUTH TWICE DAILY 60 tablet 0  . loperamide (IMODIUM) 2 MG capsule Take 2 mg by mouth daily as needed for diarrhea or loose stools.     . Rivaroxaban (XARELTO) 15 MG TABS tablet Take 1 tablet (15 mg total) by mouth daily with supper. 30 tablet 2  . acetaminophen (TYLENOL) 650 MG CR tablet Take 650 mg by mouth every 8 (eight) hours as needed for pain.    Marland Kitchen ALPRAZolam (XANAX) 0.5 MG tablet TAKE ONE TABLET BY MOUTH AT BEDTIME AS NEEDED FOR SLEEP (Patient not taking: Reported on 08/30/2014) 30 tablet 5  . atorvastatin (LIPITOR) 20 MG tablet Take 1 tablet (20 mg total) by mouth daily. 30 tablet 5  . diclofenac sodium (VOLTAREN) 1 % GEL Apply 2 g topically 4 (four) times daily. (Patient not taking: Reported on 08/30/2014) 1 Tube 1   No current facility-administered medications for  this visit.    Objective: BP 122/74 mmHg  Temp(Src) 98 F (36.7 C)  Wt 196 lb (88.905 kg) Gen: NAD, resting comfortably in bed CV: RRR no mrg  Lungs: CTAB anteriorly MSK: moves all extremities Skin: warm and dry, no rash  Neuro: CN II-XII intact, sensation and reflexes normal throughout, 5/5 muscle strength in bilateral upper and lower extremities. Normal finger to nose. Normal rapid alternating movements. No pronator drift.    Assessment/Plan:  Atrial fibrillation We had a long discussion that if  these events are in fact TIAs there is little we can do from a anticoagulant perspective. She has failed coumadin, pradaxa, eliquis at this point. We will continue xarelto. Discussed case with Dr. Tomi Likens and no evidence that ASA would provide additional benefit.    TIA (transient ischemic attack) Expressive aphasia could be caused by TIA or seizures. To minimize ischemic risk, we have started patient on atorvastatin 20mg  daily. Would like to push LDL below 70 and triglycerides >200 so may benefit from cardiac and potential TIA/CVA reduction. We also discussed an increase to keppra 1000mg  BID after discussion with Dr. Tomi Likens. We decided to only make statin change and follow up within a week.    Hyperlipidemia Lipids reveal low HDL, LDL 90, triglycerides >200. Given possible ischemic strokes-try to push LDL below 70 and triglycerides >200 would qualify for statin regardless.    Focal seizure Follow up 1 week. Consider titrating to 1000mg  keppra as discussed with Dr. Tomi Likens. Further titration would be arranged through his office.     >50% of 50 minute office visit was spent on counseling (comforting patient and husband, daughter who are all appropriately very concerned, discussing we are moving into palliative measures given extensive history and multiple failed therapies, discussing risks of lipids and treatment, consideration of Dr. Georgie Chard thoughts) and coordination of care (discussing case with Dr. Tomi Likens by phone).   Additionally family wants to discuss further an anti anxiety medicine and we will consider at follow up visit next week. Discussed there are definitely risks to this in this rather frail woman but may improve quality of life as she becomes very anxious with episodes. If seizures, may actually help episodes. I would prefer a longer acting low dose klonopin.   Meds ordered this encounter  Medications  . atorvastatin (LIPITOR) 20 MG tablet    Sig: Take 1 tablet (20 mg total) by mouth  daily.    Dispense:  30 tablet    Refill:  5

## 2014-08-30 NOTE — Assessment & Plan Note (Addendum)
Expressive aphasia could be caused by TIA or seizures. To minimize ischemic risk, we have started patient on atorvastatin 20mg  daily. Would like to push LDL below 70 and triglycerides >200 so may benefit from cardiac and potential TIA/CVA reduction. We also discussed an increase to keppra 1000mg  BID after discussion with Dr. Tomi Likens. We decided to only make statin change and follow up within a week.

## 2014-08-30 NOTE — Patient Instructions (Signed)
Start lipitor/atorvastatin 20mg  daily to lower cardiac and stroke risk  We opted to hold off on increasing keppra and will see you back in a week to discuss further  We can also consider an anxiety medicine at that point.

## 2014-09-06 ENCOUNTER — Encounter: Payer: Self-pay | Admitting: Family Medicine

## 2014-09-06 ENCOUNTER — Ambulatory Visit (INDEPENDENT_AMBULATORY_CARE_PROVIDER_SITE_OTHER): Payer: Medicare Other | Admitting: Family Medicine

## 2014-09-06 VITALS — BP 122/60 | Temp 98.3°F | Wt 187.0 lb

## 2014-09-06 DIAGNOSIS — G458 Other transient cerebral ischemic attacks and related syndromes: Secondary | ICD-10-CM

## 2014-09-06 DIAGNOSIS — R569 Unspecified convulsions: Secondary | ICD-10-CM

## 2014-09-06 NOTE — Assessment & Plan Note (Signed)
Patient very pleased with no further expressive aphasia. She does not want to increase keppra and I agreed this is reasonable. Family had been pushing for an increase in anxiety medication last visit but patient declines need for this today. We have follow up later this month for establish visit/review history further and I am hopeful she continues to do so well.

## 2014-09-06 NOTE — Assessment & Plan Note (Signed)
After having 7 episodes within 5 days last week of expressive aphasia, she has had none since that time. Once again unclear if these are seizure or TIA related though would favor seizure related.

## 2014-09-06 NOTE — Progress Notes (Signed)
Lisa Reddish, MD Phone: [phone]  Subjective:   [name] is a 81 y.o. year old very pleasant female patient who presents with the following:  expressive aphasia With ? Etiology due to TIA or seizures No further episodes since having 7 in 5 days last week. Has been able to do housework and has felt much better which is more related to her back pain than to expressive aphasia but she is encouraged overall that she is doing better. She is doing her stretches for her low back and walking and feels the best she has felt in months. She does not want to take a new anxiety medicine, increase gabapentin or keppra at this time.   ROS- no extremity or facial weakness, no difficulty understanding, no word searching difficulties.   Past Medical History- Patient Active Problem List   Diagnosis Date Noted  . Hx of ischemic multifocal multiple vascular territories stroke 06/26/2013    Priority: High  . Focal seizure 06/16/2013    Priority: High  . TIA (transient ischemic attack) 05/31/2013    Priority: High  . Large B-cell lymphoma 02/18/2012    Priority: High  . Atrial fibrillation 12/14/2008    Priority: High  . Anxiety state 10/20/2008    Priority: Medium  . DEPRESSION 10/20/2008    Priority: Medium  . COPD 03/26/2008    Priority: Medium  . Hyperlipidemia 03/18/2007    Priority: Medium  . Essential hypertension 03/18/2007    Priority: Medium  . Spinal stenosis, lumbar region, with neurogenic claudication 05/09/2014    Priority: Low  . Weakness generalized 06/30/2013    Priority: Low  . Bilateral hand pain 01/09/2013    Priority: Low   Medications- reviewed and updated Current Outpatient Prescriptions  Medication Sig Dispense Refill  . atorvastatin (LIPITOR) 20 MG tablet Take 1 tablet (20 mg total) by mouth daily. 30 tablet 5  . bisoprolol (ZEBETA) 5 MG tablet TAKE ONE TABLET BY MOUTH ONCE DAILY 30 tablet 11  . cholecalciferol (VITAMIN D) 1000 UNITS tablet Take  1,000 Units by mouth daily.    . diclofenac sodium (VOLTAREN) 1 % GEL Apply 2 g topically 4 (four) times daily. 1 Tube 1  . gabapentin (NEURONTIN) 300 MG capsule nightly (Patient taking differently: Take 300 mg by mouth at bedtime. nightly) 90 capsule 3  . Glucosamine HCl (GLUCOSAMINE PO) Take 1 tablet by mouth 2 (two) times daily.    Marland Kitchen levETIRAcetam (KEPPRA) 750 MG tablet TAKE ONE TABLET BY MOUTH TWICE DAILY 60 tablet 0  . loperamide (IMODIUM) 2 MG capsule Take 2 mg by mouth daily as needed for diarrhea or loose stools.     . Rivaroxaban (XARELTO) 15 MG TABS tablet Take 1 tablet (15 mg total) by mouth daily with supper. 30 tablet 2  . acetaminophen (TYLENOL) 650 MG CR tablet Take 650 mg by mouth every 8 (eight) hours as needed for pain.    Marland Kitchen ALPRAZolam (XANAX) 0.5 MG tablet TAKE ONE TABLET BY MOUTH AT BEDTIME AS NEEDED FOR SLEEP (Patient not taking: Reported on 08/30/2014) 30 tablet 5   No current facility-administered medications for this visit.    Objective: BP 122/60 mmHg  Temp(Src) 98.3 F (36.8 C)  Wt 187 lb (84.823 kg) Gen: NAD, resting comfortably in chair CV: RRR no mrg  Lungs: CTAB anteriorly MSK: moves all extremities Skin: warm and dry, no rash  Neuro: CN II-XII intact, sensation and reflexes normal throughout, 5/5 muscle strength in bilateral upper and lower extremities. Normal finger  to nose. Normal rapid alternating movements. No pronator drift. Walks with cane.   Assessment/Plan:  TIA (transient ischemic attack) After having 7 episodes within 5 days last week of expressive aphasia, she has had none since that time. Once again unclear if these are seizure or TIA related though would favor seizure related.    Focal seizure Patient very pleased with no further expressive aphasia. She does not want to increase keppra and I agreed this is reasonable. Family had been pushing for an increase in anxiety medication last visit but patient declines need for this today. We have  follow up later this month for establish visit/review history further and I am hopeful she continues to do so well.     Return precautions advised. In addition, she is tolerating lipitor added last week.

## 2014-09-06 NOTE — Patient Instructions (Signed)
Thrilled you had a good week.   I want you to keep doing your stretching and walking.   Follow up with me in 3-4 months unless you need me sooner. Definitely see Dr. Tomi Likens before you see me.

## 2014-09-26 ENCOUNTER — Encounter: Payer: Self-pay | Admitting: Family Medicine

## 2014-09-26 ENCOUNTER — Ambulatory Visit (INDEPENDENT_AMBULATORY_CARE_PROVIDER_SITE_OTHER): Payer: Medicare Other | Admitting: Family Medicine

## 2014-09-26 VITALS — BP 138/76 | Temp 98.1°F | Wt 185.0 lb

## 2014-09-26 DIAGNOSIS — E785 Hyperlipidemia, unspecified: Secondary | ICD-10-CM

## 2014-09-26 DIAGNOSIS — N183 Chronic kidney disease, stage 3 unspecified: Secondary | ICD-10-CM | POA: Insufficient documentation

## 2014-09-26 DIAGNOSIS — M79641 Pain in right hand: Secondary | ICD-10-CM

## 2014-09-26 DIAGNOSIS — C329 Malignant neoplasm of larynx, unspecified: Secondary | ICD-10-CM | POA: Insufficient documentation

## 2014-09-26 DIAGNOSIS — M48062 Spinal stenosis, lumbar region with neurogenic claudication: Secondary | ICD-10-CM

## 2014-09-26 DIAGNOSIS — N1831 Chronic kidney disease, stage 3a: Secondary | ICD-10-CM | POA: Insufficient documentation

## 2014-09-26 DIAGNOSIS — M4806 Spinal stenosis, lumbar region: Secondary | ICD-10-CM

## 2014-09-26 DIAGNOSIS — K5732 Diverticulitis of large intestine without perforation or abscess without bleeding: Secondary | ICD-10-CM | POA: Insufficient documentation

## 2014-09-26 DIAGNOSIS — I10 Essential (primary) hypertension: Secondary | ICD-10-CM

## 2014-09-26 DIAGNOSIS — M79642 Pain in left hand: Secondary | ICD-10-CM

## 2014-09-26 NOTE — Patient Instructions (Addendum)
Great to be able to review your history with you.   Let's check in at least every 3 months or sooner if you need me.   I am sorry for your aches and pain. i recommend you be as active as possible without worsening pain. Consider water aerobics or walking again.

## 2014-09-26 NOTE — Progress Notes (Signed)
[name], MD Phone: [phone]  Subjective:   [name] is a 81 y.o. year old very pleasant female patient who presents with the following:  Hypertension-reasonable control  BP Readings from Last 3 Encounters:  09/26/14 138/76  09/06/14 122/60  08/30/14 122/74  Home BP monitoring-no Compliant with medications-yes without side effects ROS-Denies any CP, HA, SOB, blurry vision. No further aphasia episodes.   Hyperlipidemia-suspect improved control  Lab Results  Component Value Date   LDLCALC 90 08/07/2014  On statin: atorvastatin 20mg  Regular exercise: no Diet: poor choices at times ROS- no chest pain or shortness of breath. No myalgias  Spinal Stenosis- stable Arthritis- stable COntinues to have chronic pain in hands, low back, and down into legs. Has some weakness with standing at times. voltaren gel helps for hands. Tylenol extended release 3x a day ROS- no fecal or urinary incontinence. No hot swollen joints  Past Medical History- Patient Active Problem List   Diagnosis Date Noted  . Hx of ischemic multifocal multiple vascular territories stroke 06/26/2013    Priority: High  . Focal seizure 06/16/2013    Priority: High  . TIA (transient ischemic attack) 05/31/2013    Priority: High  . Large B-cell lymphoma 02/18/2012    Priority: High  . Atrial fibrillation 12/14/2008    Priority: High  . Diverticulitis of colon 09/26/2014    Priority: Medium  . CKD (chronic kidney disease), stage III 09/26/2014    Priority: Medium  . Laryngeal carcinoma     Priority: Medium  . Anxiety state 10/20/2008    Priority: Medium  . Depression 10/20/2008    Priority: Medium  . COPD (chronic obstructive pulmonary disease) 03/26/2008    Priority: Medium  . Hyperlipidemia 03/18/2007    Priority: Medium  . Essential hypertension 03/18/2007    Priority: Medium  . Spinal stenosis, lumbar region, with neurogenic claudication 05/09/2014    Priority: Low  . Weakness  generalized 06/30/2013    Priority: Low  . Bilateral hand pain 01/09/2013    Priority: Low   Medications- reviewed and updated Current Outpatient Prescriptions  Medication Sig Dispense Refill  . atorvastatin (LIPITOR) 20 MG tablet Take 1 tablet (20 mg total) by mouth daily. 30 tablet 5  . bisoprolol (ZEBETA) 5 MG tablet TAKE ONE TABLET BY MOUTH ONCE DAILY 30 tablet 11  . cholecalciferol (VITAMIN D) 1000 UNITS tablet Take 1,000 Units by mouth daily.    Marland Kitchen gabapentin (NEURONTIN) 300 MG capsule nightly (Patient taking differently: Take 300 mg by mouth at bedtime. nightly) 90 capsule 3  . Glucosamine HCl (GLUCOSAMINE PO) Take 1 tablet by mouth 2 (two) times daily.    Marland Kitchen levETIRAcetam (KEPPRA) 750 MG tablet TAKE ONE TABLET BY MOUTH TWICE DAILY 60 tablet 0  . loperamide (IMODIUM) 2 MG capsule Take 2 mg by mouth daily as needed for diarrhea or loose stools.     . Rivaroxaban (XARELTO) 15 MG TABS tablet Take 1 tablet (15 mg total) by mouth daily with supper. 30 tablet 2  . acetaminophen (TYLENOL) 650 MG CR tablet Take 650 mg by mouth every 8 (eight) hours as needed for pain.    Marland Kitchen diclofenac sodium (VOLTAREN) 1 % GEL Apply 2 g topically 4 (four) times daily. (Patient not taking: Reported on 09/26/2014) 1 Tube 1   Objective: BP 138/76 mmHg  Temp(Src) 98.1 F (36.7 C)  Wt 185 lb (83.915 kg) Gen: NAD, resting comfortably in chair, slow to rise, uses cane CV: RRR no murmurs rubs or gallops  Lungs: CTAB no crackles, wheeze, rhonchi Abdomen: soft/nontender/nondistended/normal bowel sounds.  Ext: no edema, 5/5 leg strength against my resistance Skin: warm, dry, no rash   Assessment/Plan:  Essential hypertension Reasonable control on bisoprolol.    Hyperlipidemia LDL 90 on last check. Has been tolerating atorvastatin. Hopeful for LDL <70 with history CVA. Also triglycerides >200 and hopeful <200 on recheck.    Spinal stenosis, lumbar region, with neurogenic claudication Pain in back and both  legs at times, some weakness. Patient would not be surgical candidate. She will continue her tylenol 3x a day.    Bilateral hand pain Continue as needed voltaren/lotion/aspircrem.    3 month follow up.

## 2014-09-26 NOTE — Assessment & Plan Note (Signed)
Pain in back and both legs at times, some weakness. Patient would not be surgical candidate. She will continue her tylenol 3x a day.

## 2014-09-26 NOTE — Assessment & Plan Note (Signed)
LDL 90 on last check. Has been tolerating atorvastatin. Hopeful for LDL <70 with history CVA. Also triglycerides >200 and hopeful <200 on recheck.

## 2014-09-26 NOTE — Assessment & Plan Note (Signed)
Reasonable control on bisoprolol.

## 2014-09-26 NOTE — Assessment & Plan Note (Signed)
Continue as needed voltaren/lotion/aspircrem.

## 2014-10-22 ENCOUNTER — Other Ambulatory Visit: Payer: Self-pay | Admitting: Family Medicine

## 2014-11-06 ENCOUNTER — Encounter: Payer: Self-pay | Admitting: Neurology

## 2014-11-06 ENCOUNTER — Ambulatory Visit (INDEPENDENT_AMBULATORY_CARE_PROVIDER_SITE_OTHER): Payer: Medicare Other | Admitting: Neurology

## 2014-11-06 ENCOUNTER — Telehealth: Payer: Self-pay | Admitting: Neurology

## 2014-11-06 VITALS — BP 138/70 | HR 78 | Temp 98.2°F | Resp 18 | Ht 65.0 in | Wt 189.0 lb

## 2014-11-06 DIAGNOSIS — M48061 Spinal stenosis, lumbar region without neurogenic claudication: Secondary | ICD-10-CM

## 2014-11-06 DIAGNOSIS — Z8673 Personal history of transient ischemic attack (TIA), and cerebral infarction without residual deficits: Secondary | ICD-10-CM

## 2014-11-06 DIAGNOSIS — M4806 Spinal stenosis, lumbar region: Secondary | ICD-10-CM | POA: Diagnosis not present

## 2014-11-06 DIAGNOSIS — R4701 Aphasia: Secondary | ICD-10-CM

## 2014-11-06 MED ORDER — GABAPENTIN 300 MG PO CAPS: 300.0000 mg | ORAL_CAPSULE | Freq: Two times a day (BID) | ORAL | Status: DC

## 2014-11-06 NOTE — Telephone Encounter (Signed)
Pt called wanting to speak to a nurse regarding her physical therapy. Please call her # [phone]

## 2014-11-06 NOTE — Patient Instructions (Signed)
1.  Continue Keppra at 750mg  twice daily 2.  Increase gabapentin 300mg  to 1 capsule twice daily. 3. Follow up in 3 months.

## 2014-11-06 NOTE — Progress Notes (Signed)
NEUROLOGY FOLLOW UP OFFICE NOTE  [name] [id number]  HISTORY OF PRESENT ILLNESS: [name] is an 81 year old right-handed woman with history of hypertension, atrial fibrillation, peripheral vascular disease, anxiety, depression, B Cell lymphoma status post chemotherapy, and COPD who follows up for recurrent aphasia, cardio-embolic stroke and symptomatic seizure secondary to stroke, and lumbar stenosis.  Records reviewed.  Spoke with PCP.  UPDATE: Carotid doppler from January revealed no hemodynamically significant ICA stenosis.  Lipid panel showed cholesterol of 170, triglycerides of 235, HDL 33 and LDL 90.  Later in January, she experienced several episodes of expressive aphasia.  However, she has not had any further spells.  Her back pain and left leg pain is still an issue.  She exercises in the morning.  HISTORY: Since childhood, she has episodes of numbness in either of both hands and inability to get words out.  It lasts for about 2 minutes.  It should be known that she has had these episodes since childhood.  Sometimes it was associated with headache.  She had them about twice a year until young adulthood, when they resolved.  Last fall, she had several severe episodes and was started on Keppra at that time, for possible seizures.    She has had several episodes of confusion and aphasia in October and November of 2014, associated with stroke and presumed seizure.  On 05/26/13, MRI of the brain was performed, which revealed an acute lacunar infarct in the left anterior corona radiata and lentiform nucleus.  CTA of the head revealed atherosclerotic changes in the cavernous carotid arteries without significant stenosis.  CTA of neck revealed focal 50% stenosis of mid right ICA.  She was switched from [name] to Eliquis.  She was readmitted to the hospital on 05/31/13 with recurrent aphasia.  She had trouble getting her words out and then couldn't speak at all.  Repeat MRI of  brain revealed new punctate infarct in the left posterior corona radiata, the same location as previous infarct. No changes in management were made.  She was readmitted to the hospital on 06/07/13 for altered mental status.  EEG was performed, showing left fronto-parieto-temporal theta slowing.  Due to possibility of seizure, she was started on Keppra 250mg  BID.  No change made to Eliquis.  She presented to the ED on 06/16/13 after waking up in the morning with expressive aphasia and right-sided weakness.  Symptoms improved in the ED.  CT head revealed prior lacunar infarct in left lentiform nucleus and corona radiata, but no new abnormalities.  It was suspected that she may have had another seizure and her Keppra was increased to 500mg  BID.  She was admitted to the hospital again on 06/21/13 again for expressive aphasia.  MRI brain revealed 2 new punctate infarcts in the bilateral hemispheres at level of centrum semiovale.  Eliquis was changed to Xarelto. Keppra was increased to 750mg  BID.    She presented to the ED on 08/05/14 for recurrent episode of aphasia, lasting 1.5 hours.  This was more significant than last year.  She described it as knowing what she wanted to say but having trouble getting the words out.  She had trouble writing as well.  MRI of brain revealed no acute infarct.  She had an episode two days prior, lasting 1 hour.  Due to stroke and pneumonia, she had undergone deconditioning so she started physical therapy.  She has a walker but doesn't use it all the time.  She does not have  a walker now.  She has history of gait problems after knee surgery, but she has been more unsteady since her prolonged hospital admissions.  She has history of numbness on the bottom of her feet.  Retinal migraines:  Of note, she has longstanding history of migraines, described as scotomas, fortification spectrum and flashing lights in the right eye.  There is no associated headache.  Currently, she remains on  Keppra 1000mg  twice daily, Xarelto, Zebeta.  PAST MEDICAL HISTORY: Past Medical History  Diagnosis Date  . GERD (gastroesophageal reflux disease)   . History of TIAs   . PUD (peptic ulcer disease)   . PVD (peripheral vascular disease)   . Hyperlipidemia   . Anxiety   . Depression   . Blood transfusion     "w/1st miscarriage" (05/26/2013)  . COPD (chronic obstructive pulmonary disease)   . Hypertension   . Family history of anesthesia complication     "sisters also get sick as a dog" (05/26/2013)  . Coronary artery disease   . Heart murmur     "when I was a child" (05/26/2013)  . DVT (deep venous thrombosis) 1954    "after I had my first child" (05/26/2013)  . Pneumonia 2013    "once" (05/26/2013)  . Exertional shortness of breath   . H/O hiatal hernia   . Stroke 05/26/2013    "very mild; affected my speech for a while" (05/26/2013)  . Arthritis     "lots; hands" (05/26/2013)  . 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 Lymphona, mantle cell, inguinal region/lower limb     "large c-cell" (05/26/2013)  . VARICOSE VEINS LOWER EXTREMITIES W/INFLAMMATION 01/15/2009  . PEPTIC ULCER DISEASE 07/06/2008  . OSTEOARTHRITIS 05/03/2007  . HCAP (healthcare-associated pneumonia) 06/26/2013  . Diverticulitis of colon with hemorrhage 07/24/2008  . ABDOMINAL INCISIONAL HERNIA 04/03/2010       . GERD 05/03/2007         MEDICATIONS: Current Outpatient Prescriptions on File Prior to Visit  Medication Sig Dispense Refill  . acetaminophen (TYLENOL) 650 MG CR tablet Take 650 mg by mouth every 8 (eight) hours as needed for pain.    Marland Kitchen atorvastatin (LIPITOR) 20 MG tablet Take 1 tablet (20 mg total) by mouth daily. 30 tablet 5  . bisoprolol (ZEBETA) 5 MG tablet TAKE ONE TABLET BY MOUTH ONCE DAILY 30 tablet 11  . cholecalciferol (VITAMIN D) 1000 UNITS tablet Take 1,000 Units by mouth daily.    . diclofenac sodium (VOLTAREN) 1 % GEL Apply 2 g topically 4  (four) times daily. 1 Tube 1  . Glucosamine HCl (GLUCOSAMINE PO) Take 1 tablet by mouth 2 (two) times daily.    Marland Kitchen levETIRAcetam (KEPPRA) 750 MG tablet TAKE ONE TABLET BY MOUTH TWICE DAILY 60 tablet 0  . loperamide (IMODIUM) 2 MG capsule Take 2 mg by mouth daily as needed for diarrhea or loose stools.     Alveda Reasons 15 MG TABS tablet TAKE ONE TABLET BY MOUTH ONCE DAILY WITH SUPPER 30 tablet 5   No current facility-administered medications on file prior to visit.    ALLERGIES: Allergies  Allergen Reactions  . Ativan [Lorazepam] Other (See Comments)    Severe hallucinations   . Codeine Itching  . Propoxyphene Hcl     Rxn: unknown  . Vancomycin     unknown  . Penicillins Rash    FAMILY HISTORY: Family History  Problem Relation Age of Onset  . Colon cancer  Sister     colon  . Hyperlipidemia Mother   . Stroke Mother   . Cancer Father     mesothelioma    SOCIAL HISTORY: History   Social History  . Marital Status: Married    Spouse Name: N/A  . Number of Children: 6  . Years of Education: N/A   Occupational History  . retired    Social History Main Topics  . Smoking status: Former Smoker -- 2.50 packs/day for 37 years    Types: Cigarettes    Quit date: 09/05/1984  . Smokeless tobacco: Never Used  . Alcohol Use: No  . Drug Use: No  . Sexual Activity: No   Other Topics Concern  . Not on file   Social History Narrative   Married (husband patient of Dr. Yong Channel). 6 children (lost 2 for total 8). 15 grandkids. 6 greatgrandchildren.    Lives with husband. Husband drives. Patient does not drive.       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     REVIEW OF SYSTEMS: Constitutional: No fevers, chills, or sweats, no generalized fatigue, change in appetite Eyes: No visual changes, double vision, eye pain Ear, nose and throat: No hearing loss, ear pain, nasal congestion, sore throat Cardiovascular: No chest pain,  palpitations Respiratory:  No shortness of breath at rest or with exertion, wheezes GastrointestinaI: No nausea, vomiting, diarrhea, abdominal pain, fecal incontinence Genitourinary:  No dysuria, urinary retention or frequency Musculoskeletal:  No neck pain, back pain Integumentary: No rash, pruritus, skin lesions Neurological: as above Psychiatric: No depression, insomnia, anxiety Endocrine: No palpitations, fatigue, diaphoresis, mood swings, change in appetite, change in weight, increased thirst Hematologic/Lymphatic:  No anemia, purpura, petechiae. Allergic/Immunologic: no itchy/runny eyes, nasal congestion, recent allergic reactions, rashes  PHYSICAL EXAM: Filed Vitals:   11/06/14 0932  BP: 138/70  Pulse: 78  Temp: 98.2 F (36.8 C)  Resp: 18   General: No acute distress Head:  Normocephalic/atraumatic Eyes:  Fundoscopic exam unremarkable without vessel changes, exudates, hemorrhages or papilledema. Neck: supple, no paraspinal tenderness, full range of motion Heart:  Regular rate and rhythm Lungs:  Clear to auscultation bilaterally Back: No paraspinal tenderness Neurological Exam: alert and oriented to person, place, and time. Attention span and concentration intact, recent and remote memory intact, fund of knowledge intact.  Speech fluent and not dysarthric, language intact.  CN II-XII intact. Fundi not visualized on inspection.  Bulk and tone normal, muscle strength 5/5 in upper extremities and 5-/5 lower extremities.  Deep tendon reflexes 2+ throughout.  Finger to nose testing with postural and kinetic tremor but no dysmetria.  Gait mildly wide-based antalgic.  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 Lumbar spinal stenosis  PLAN: Xarelto Keppra 750mg  twice daily LDL at goal of less than 90.  Would try and optimize HDL and  triglycerides Increase gabapentin to 300mg  twice daily Follow up in 3 months.  Metta Clines, DO  CC: [name], MD

## 2014-11-06 NOTE — Telephone Encounter (Signed)
Patient will contact  Dr Tamala Julian to follow up with PT

## 2014-11-26 ENCOUNTER — Other Ambulatory Visit: Payer: Self-pay | Admitting: *Deleted

## 2014-11-26 ENCOUNTER — Ambulatory Visit (INDEPENDENT_AMBULATORY_CARE_PROVIDER_SITE_OTHER): Payer: Medicare Other | Admitting: Family Medicine

## 2014-11-26 ENCOUNTER — Encounter: Payer: Self-pay | Admitting: Family Medicine

## 2014-11-26 VITALS — BP 128/82 | HR 67 | Ht 65.0 in | Wt 194.0 lb

## 2014-11-26 DIAGNOSIS — M48062 Spinal stenosis, lumbar region with neurogenic claudication: Secondary | ICD-10-CM

## 2014-11-26 DIAGNOSIS — M4806 Spinal stenosis, lumbar region: Secondary | ICD-10-CM

## 2014-11-26 NOTE — Progress Notes (Signed)
Pre visit review using our clinic review tool, if applicable. No additional management support is needed unless otherwise documented below in the visit note. 

## 2014-11-26 NOTE — Patient Instructions (Signed)
Good to see you Increase gabapentin to 3 times daily Ice is still your friend Aquatic therapy will be calling you Call me if you change your mind on the epidural or worsening symptoms Try the pennsaid twice daily topically to back and neck, just a pinkie amount See me again in 4 weeks.

## 2014-11-26 NOTE — Progress Notes (Signed)
  [facility] [place],  [address] Phone: [phone] Subjective:    I'm seeing this patient by the request  of:  [name], MD Dr. Tomi Likens  CC: Low back pain  [id number] [name] is a 81 y.o. female coming in with complaint of low back pain. Patient has been seen previously. Patient is also seen neurology recently. Patient has had significant comorbidities for quite some time. Patient has known spinal stenosis. MRI of lumbar spine on 05/08/2014 showed that patient had significant progression of disc degeneration at L4-L5 with moderate multifactorial spinal stenosis. Patient also had significant osteophytic changes of multiple other areas in the lumbar spine. Patient has noticed some mild weakness of the lower extremities. Patient was to start aquatic formal physical therapy which patient did not do. Patient that she could do this on her own. Patient is though feeling less and less stable on her feet. Patient is ambulating with the aid of a walker. States that she has pain that seems to go down the posterior lateral aspect of both legs fairly consistently. Patient states that after walking about 50 feet she needs to usually take a rest.     Past medical history, social, surgical and family history all reviewed in electronic medical record.   Review of Systems: No headache, visual changes, nausea, vomiting, diarrhea, constipation, dizziness, abdominal pain, skin rash, fevers, chills, night sweats, weight loss, swollen lymph nodes, body aches, joint swelling, muscle aches, chest pain, shortness of breath, mood changes.   Objective Blood pressure 128/82, pulse 67, height 5\' 5"  (1.651 m), weight 194 lb (87.998 kg), SpO2 98 %.  General: No apparent distress alert and oriented x3 mood and affect normal, dressed appropriately.  HEENT: Pupils equal, extraocular movements intact  Respiratory: Patient's speak in full sentences and does  not appear short of breath  Cardiovascular: No lower extremity edema, non tender, no erythema  Skin: Warm dry intact with no signs of infection or rash on extremities or on axial skeleton.  Abdomen: Soft nontender  Neuro: Cranial nerves II through XII are intact, neurovascularly intact in all extremities with 2+ DTRs and 2+ pulses.  Lymph: No lymphadenopathy of posterior or anterior cervical chain or axillae bilaterally.  Gait mild antalgic gait with patient using walker.  MSK: Non tender with full range of motion and good stability and symmetric strength and tone of shoulders, elbows, wrist, hip, knee and ankles bilaterally.  Back Exam:  Inspection: Unremarkable  Motion: Flexion 25 deg, Extension 15 deg with pain, Side Bending to 35 deg bilaterally,  Rotation to 25 deg bilaterally  SLR laying: Negative  XSLR laying: Negative  Palpable tenderness: Mild paraspinal musculature tenderness which is new from previous exam. FABER: negative. Sensory change: Gross sensation intact to all lumbar and sacral dermatomes.  Reflexes: 2+ at both patellar tendons, 2+ at achilles tendons, Babinski's downgoing.  Strength at foot  Plantar-flexion: 5/5 Dorsi-flexion: 5/5 Eversion: 5/5 Inversion: 5/5  Leg strength  Quad: 4/5 Hamstring: 4/5 Hip flexor: 4/5 Hip abductors: 4/5  But all seem to be symmetric     Impression and Recommendations:     This case required medical decision making of moderate complexity.

## 2014-11-26 NOTE — Assessment & Plan Note (Signed)
Patient does have spinal stenosis with neurogenic claudication at L4-L5. This is bilateral. Patient though when looking at my last note has not made any significant worsening of symptoms clinically. Discussed with patient at great length and patient has agreed to try the aquatic formal physical therapy. Patient was sent in to do this today. We discussed the possibility of an epidural steroid injection with patient's weakness. Patient as well as her husband will discuss this and decide if they need to do it. Patient will continue to walk with the aid of a walker until patient starts doing more strengthening. We did discuss protein supplementation. Patient will also do an icing protocol. Patient will come back and see me again in 4 weeks for further evaluation and treatment.  Spent  25 minutes with patient face-to-face and had greater than 50% of counseling including as described above in assessment and plan.

## 2014-11-27 ENCOUNTER — Telehealth: Payer: Self-pay | Admitting: Family Medicine

## 2014-11-27 NOTE — Telephone Encounter (Signed)
Pt husband called in and is requesting call back from Dr Tamala Julian nurse.  Would not tell me what it was regarding    Best number [phone]

## 2014-11-27 NOTE — Telephone Encounter (Signed)
Spoke with pt's husband & made him aware that the epidural injection & PT referral were entered yesterday & they should be receiving a phone call this week to get those scheduled. Pt's husband understood.

## 2014-11-29 ENCOUNTER — Ambulatory Visit (INDEPENDENT_AMBULATORY_CARE_PROVIDER_SITE_OTHER): Payer: Medicare Other | Admitting: Family Medicine

## 2014-11-29 ENCOUNTER — Telehealth: Payer: Self-pay | Admitting: Family Medicine

## 2014-11-29 ENCOUNTER — Encounter: Payer: Self-pay | Admitting: Family Medicine

## 2014-11-29 VITALS — BP 140/72 | HR 82 | Temp 98.5°F | Wt 184.0 lb

## 2014-11-29 DIAGNOSIS — M4806 Spinal stenosis, lumbar region: Secondary | ICD-10-CM

## 2014-11-29 DIAGNOSIS — M48062 Spinal stenosis, lumbar region with neurogenic claudication: Secondary | ICD-10-CM

## 2014-11-29 MED ORDER — ALPRAZOLAM 0.25 MG PO TABS: 0.2500 mg | ORAL_TABLET | Freq: Once | ORAL | Status: DC | PRN

## 2014-11-29 NOTE — Telephone Encounter (Signed)
Are you aware of this Dr. Yong Channel? I dont see any documentation on any prior OV notes.

## 2014-11-29 NOTE — Telephone Encounter (Signed)
Mrs [name] have pt come in at 4:15pm today, create a slot or whatever needs to be done. Thanks.

## 2014-11-29 NOTE — Patient Instructions (Addendum)
We decided the potential benefit of having the injection and it potentially helping is greater in importance than potential risk of stroke coming off xarelto for 1 day. You may hold your xarelto for 1 day for the procedure.   I also gave you a xanax to take once you arrive at the imaging center as long as your husband is driving you. You did have a hallucinations on ativan in the past so there is some risk for this so watch closely.

## 2014-11-29 NOTE — Progress Notes (Signed)
Pre visit review using our clinic review tool, if applicable. No additional management support is needed unless otherwise documented below in the visit note. 

## 2014-11-29 NOTE — Progress Notes (Signed)
  [name], MD Phone: [phone]  [name] is a 81 y.o. year old very pleasant female patient who presents for discussion about coming off of Xarelto for her epidural steroid injection through [facility] imaging. Patient's symptoms from her spinal stenosis have been very difficult to manage. She has low back pain radiating into bilateral legs. and difficulty managing walking or standing for any period due to the discomfort. Her stability is worsening and she didn't even feel safe getting into the pool. She is walking with a walker the vast majority of the time. Her discomfort from this is great and it is affecting her quality of life. Has not cooked in months as does not want to stand for that period.   She does have real risk for CVA/TIA with her a fib and we discussed this.   After our discussion, patient would like to proceed forward with holding off with coumadin for a day given her quality of life and importance of potentially improving her symptoms when compared to risk of CVA/TIA. I am in agreement with her that this is a reasonable appraoch.   >50% of 15 minute office visit was spent on counseling (discussion above) and coordination of care

## 2014-11-29 NOTE — Telephone Encounter (Signed)
Pt thought she was going today for epidural today, but heard from someone that states dr hunter wants to see her prior to this procedure.  Pt wants to see him today so she can have this tomorrow. Epidural is not scheduled yet.  Pt states something to do w/ her xarelto dose. No appts today pls advise

## 2014-11-30 ENCOUNTER — Ambulatory Visit
Admission: RE | Admit: 2014-11-30 | Discharge: 2014-11-30 | Disposition: A | Discharge status: Home / Self Care | Payer: Medicare Other | Source: Ambulatory Visit | Attending: Family Medicine | Admitting: Family Medicine

## 2014-11-30 DIAGNOSIS — M48062 Spinal stenosis, lumbar region with neurogenic claudication: Secondary | ICD-10-CM

## 2014-11-30 MED ORDER — IOHEXOL 180 MG/ML  SOLN
1.0000 mL | Freq: Once | INTRAMUSCULAR | Status: AC | PRN
  Administered 2014-11-30: 1 mL via EPIDURAL

## 2014-11-30 MED ORDER — METHYLPREDNISOLONE ACETATE 40 MG/ML INJ SUSP (RADIOLOG
120.0000 mg | Freq: Once | INTRAMUSCULAR | Status: AC
  Administered 2014-11-30: 120 mg via EPIDURAL

## 2014-11-30 NOTE — Discharge Instructions (Signed)

## 2014-12-04 NOTE — Telephone Encounter (Signed)
done

## 2014-12-25 ENCOUNTER — Ambulatory Visit: Payer: Medicare Other | Admitting: Neurology

## 2014-12-26 ENCOUNTER — Ambulatory Visit (INDEPENDENT_AMBULATORY_CARE_PROVIDER_SITE_OTHER): Payer: Medicare Other | Admitting: Family Medicine

## 2014-12-26 ENCOUNTER — Encounter: Payer: Self-pay | Admitting: Family Medicine

## 2014-12-26 VITALS — BP 106/68 | HR 78 | Ht 65.0 in | Wt 195.0 lb

## 2014-12-26 DIAGNOSIS — M4806 Spinal stenosis, lumbar region: Secondary | ICD-10-CM

## 2014-12-26 DIAGNOSIS — M48062 Spinal stenosis, lumbar region with neurogenic claudication: Secondary | ICD-10-CM

## 2014-12-26 NOTE — Progress Notes (Signed)
  [facility] [address] [place], [place] [address] Phone: [phone] Subjective:    I'm seeing this patient by the request  of:  [name], MD Dr. Tomi Likens  CC: Low back pain  [id number] [name] is a 81 y.o. female coming in with complaint of low back pain. Patient has been seen previously. Patient is also seen neurology recently. Patient has had significant comorbidities for quite some time. Patient has known spinal stenosis. MRI of lumbar spine on 05/08/2014 showed that patient had significant progression of disc degeneration at L4-L5 with moderate multifactorial spinal stenosis. Patient also had significant osteophytic changes of multiple other areas in the lumbar spine. Patient was having symptomatic spinal stenosis with some weakness of the lower extremities and was unable to walk greater than 200 feet. Patient elected try aquatic therapy and patient was given an epidural in her back in April of this year. Patient states she is doing a proximally 60% better. Patient states that she can stand a long amount of time but still can be very aggravating. Patient states certain movements can have a severe pain but these seem to be less than usual. Patient states that she continues to improve very slowly she thinks and has not had any worsening symptoms.     Past medical history, social, surgical and family history all reviewed in electronic medical record.   Review of Systems: No headache, visual changes, nausea, vomiting, diarrhea, constipation, dizziness, abdominal pain, skin rash, fevers, chills, night sweats, weight loss, swollen lymph nodes, body aches, joint swelling, muscle aches, chest pain, shortness of breath, mood changes.   Objective Blood pressure 106/68, pulse 78, height 5\' 5"  (1.651 m), weight 195 lb (88.451 kg), SpO2 89 %.  General: No apparent distress alert and oriented x3 mood and affect normal, dressed appropriately.  HEENT:  Pupils equal, extraocular movements intact  Respiratory: Patient's speak in full sentences and does not appear short of breath  Cardiovascular: No lower extremity edema, non tender, no erythema  Skin: Warm dry intact with no signs of infection or rash on extremities or on axial skeleton.  Abdomen: Soft nontender  Neuro: Cranial nerves II through XII are intact, neurovascularly intact in all extremities with 2+ DTRs and 2+ pulses.  Lymph: No lymphadenopathy of posterior or anterior cervical chain or axillae bilaterally.  Gait mild antalgic gait with patient using walker.  MSK: Non tender with full range of motion and good stability and symmetric strength and tone of shoulders, elbows, wrist, hip, knee and ankles bilaterally.  Back Exam:  Inspection: Unremarkable  Motion: Flexion 25 deg, Extension 15 deg with pain, Side Bending to 35 deg bilaterally,  Rotation to 25 deg bilaterally  SLR laying: Negative  XSLR laying: Negative  Palpable tenderness: Mild increasing tenderness over the sacroiliac joints bilaterally  FABER: negative. Sensory change: Gross sensation intact to all lumbar and sacral dermatomes.  Reflexes: 2+ at both patellar tendons, 2+ at achilles tendons, Babinski's downgoing.  Strength at foot  Plantar-flexion: 5/5 Dorsi-flexion: 5/5 Eversion: 5/5 Inversion: 5/5  Leg strength  Quad: 4/5 Hamstring: 4/5 Hip flexor: 4/5 Hip abductors: 4/5  But all seem to be symmetric     Impression and Recommendations:     This case required medical decision making of moderate complexity.

## 2014-12-26 NOTE — Patient Instructions (Addendum)
Good to see you Ice can help Cointue the vitamins we have discussed. Especially vitamin D 2000 Iu daily Continue to stay active.  Call me in 2 weeks and if not better we can try an injection

## 2014-12-26 NOTE — Progress Notes (Signed)
Pre visit review using our clinic review tool, if applicable. No additional management support is needed unless otherwise documented below in the visit note. 

## 2014-12-26 NOTE — Assessment & Plan Note (Signed)
Patient overall did respond very well to the epidural sterile injection at L4-L5. Patient does have some mild neurogenic claudication with patient having the discomfort with standing a long amount of time but has responded fairly well to gabapentin without any significant side effects. We discussed the possibility of going up to 3 times daily but patient is already having some mild fatigue secondary to the medication. We discussed continuing the exercises and patient is no longer going to aquatic therapy. We discussed with patient to do the over-the-counter natural supplementation including vitamin D on a more regular basis. Patient and will come back and see me again in 4 weeks or call sooner if she decides to have a repeat epidural sterile injection. Patient knows if any worsening talar bladder incontinence or any significant weakness of the lower extremities see medical attention immediately.  Spent  25 minutes with patient face-to-face and had greater than 50% of counseling including as described above in assessment and plan.

## 2015-01-01 ENCOUNTER — Other Ambulatory Visit: Payer: Self-pay

## 2015-01-01 MED ORDER — BISOPROLOL FUMARATE 5 MG PO TABS: 5.0000 mg | ORAL_TABLET | Freq: Every day | ORAL | Status: DC

## 2015-01-03 ENCOUNTER — Telehealth: Payer: Self-pay | Admitting: Family Medicine

## 2015-01-03 DIAGNOSIS — M48062 Spinal stenosis, lumbar region with neurogenic claudication: Secondary | ICD-10-CM

## 2015-01-03 NOTE — Telephone Encounter (Signed)
Order entered, pt made aware.  

## 2015-01-03 NOTE — Telephone Encounter (Signed)
Patient is requesting her 2nd epidural shot

## 2015-01-14 ENCOUNTER — Ambulatory Visit
Admission: RE | Admit: 2015-01-14 | Discharge: 2015-01-14 | Disposition: A | Discharge status: Home / Self Care | Payer: Medicare Other | Source: Ambulatory Visit | Attending: Family Medicine | Admitting: Family Medicine

## 2015-01-14 VITALS — BP 127/90 | HR 72

## 2015-01-14 DIAGNOSIS — M48062 Spinal stenosis, lumbar region with neurogenic claudication: Secondary | ICD-10-CM

## 2015-01-14 MED ORDER — IOHEXOL 180 MG/ML  SOLN
1.0000 mL | Freq: Once | INTRAMUSCULAR | Status: AC | PRN
  Administered 2015-01-14: 1 mL via EPIDURAL

## 2015-01-14 MED ORDER — METHYLPREDNISOLONE ACETATE 40 MG/ML INJ SUSP (RADIOLOG
120.0000 mg | Freq: Once | INTRAMUSCULAR | Status: AC
  Administered 2015-01-14: 120 mg via EPIDURAL

## 2015-01-14 NOTE — Discharge Instructions (Signed)

## 2015-01-28 ENCOUNTER — Other Ambulatory Visit: Payer: Self-pay

## 2015-02-11 ENCOUNTER — Other Ambulatory Visit: Payer: Self-pay | Admitting: *Deleted

## 2015-02-11 DIAGNOSIS — M48062 Spinal stenosis, lumbar region with neurogenic claudication: Secondary | ICD-10-CM

## 2015-02-13 ENCOUNTER — Encounter: Payer: Self-pay | Admitting: Neurology

## 2015-02-13 ENCOUNTER — Ambulatory Visit (INDEPENDENT_AMBULATORY_CARE_PROVIDER_SITE_OTHER): Payer: Medicare Other | Admitting: Neurology

## 2015-02-13 VITALS — BP 112/64 | HR 78 | Resp 20 | Ht 65.0 in | Wt 182.6 lb

## 2015-02-13 DIAGNOSIS — G40109 Localization-related (focal) (partial) symptomatic epilepsy and epileptic syndromes with simple partial seizures, not intractable, without status epilepticus: Secondary | ICD-10-CM | POA: Diagnosis not present

## 2015-02-13 DIAGNOSIS — Z8673 Personal history of transient ischemic attack (TIA), and cerebral infarction without residual deficits: Secondary | ICD-10-CM | POA: Diagnosis not present

## 2015-02-13 DIAGNOSIS — F411 Generalized anxiety disorder: Secondary | ICD-10-CM

## 2015-02-13 DIAGNOSIS — M4806 Spinal stenosis, lumbar region: Secondary | ICD-10-CM | POA: Diagnosis not present

## 2015-02-13 DIAGNOSIS — R4789 Other speech disturbances: Secondary | ICD-10-CM | POA: Diagnosis not present

## 2015-02-13 DIAGNOSIS — M48061 Spinal stenosis, lumbar region without neurogenic claudication: Secondary | ICD-10-CM

## 2015-02-13 MED ORDER — GABAPENTIN 100 MG PO CAPS: 100.0000 mg | ORAL_CAPSULE | Freq: Three times a day (TID) | ORAL | Status: DC

## 2015-02-13 NOTE — Patient Instructions (Signed)
1.  Stop current gabapentin.  Start 100mg  capsules.  Take 1 capsule three times daily.  Call with updates or need to change dose. 2.  Continue Keppra 750mg  twice daily 3.  Follow up in 3 months.

## 2015-02-13 NOTE — Progress Notes (Signed)
NEUROLOGY FOLLOW UP OFFICE NOTE  [name] [id number]  HISTORY OF PRESENT ILLNESS: [name] is an 81 year old right-handed woman with history of hypertension, atrial fibrillation, peripheral vascular disease, anxiety, depression, B Cell lymphoma status post chemotherapy, and COPD who follows up for recurrent aphasia, cardio-embolic stroke and symptomatic seizure secondary to stroke, and lumbar stenosis.  She is accompanied by her husband and daughter who provide history.  UPDATE: She is taking Keppra 750mg  twice daily to address possibility that the recurrent episodes of aphasia are seizures.  She has not had any recurrent spells.  Her lumbar stenosis is still a problem.  She is currently receiving epidural injections with minimal benefit.  She is taking gabapentin 300mg  at bedtime.  She cannot take dosing during the day due to drowsiness.  It is really only painful when she walks.  She feels increased anxiety and sometimes if she talks too fast, she has some word-finding difficulties and has to stop and think a momemt.  HISTORY: Since childhood, she has episodes of numbness in either of both hands and inability to get words out.  It lasts for about 2 minutes.  It should be known that she has had these episodes since childhood.  Sometimes it was associated with headache.  She had them about twice a year until young adulthood, when they resolved.  Last fall, she had several severe episodes and was started on Keppra at that time, for possible seizures.    She has had several episodes of confusion and aphasia in October and November of 2014, associated with stroke and presumed seizure.  On 05/26/13, MRI of the brain was performed, which revealed an acute lacunar infarct in the left anterior corona radiata and lentiform nucleus.  CTA of the head revealed atherosclerotic changes in the cavernous carotid arteries without significant stenosis.  CTA of neck revealed focal 50% stenosis of  mid right ICA.  She was switched from [name] to Eliquis.  She was readmitted to the hospital on 05/31/13 with recurrent aphasia.  She had trouble getting her words out and then couldn't speak at all.  Repeat MRI of brain revealed new punctate infarct in the left posterior corona radiata, the same location as previous infarct. No changes in management were made.  She was readmitted to the hospital on 06/07/13 for altered mental status.  EEG was performed, showing left fronto-parieto-temporal theta slowing.  Due to possibility of seizure, she was started on Keppra 250mg  BID.  No change made to Eliquis.  She presented to the ED on 06/16/13 after waking up in the morning with expressive aphasia and right-sided weakness.  Symptoms improved in the ED.  CT head revealed prior lacunar infarct in left lentiform nucleus and corona radiata, but no new abnormalities.  It was suspected that she may have had another seizure and her Keppra was increased to 500mg  BID.  She was admitted to the hospital again on 06/21/13 again for expressive aphasia.  MRI brain revealed 2 new punctate infarcts in the bilateral hemispheres at level of centrum semiovale.  Eliquis was changed to Xarelto. Keppra was increased to 750mg  BID.    She presented to the ED on 08/05/14 for recurrent episode of aphasia, lasting 1.5 hours.  This was more significant than last year.  She described it as knowing what she wanted to say but having trouble getting the words out.  She had trouble writing as well.  MRI of brain revealed no acute infarct.  She had an  episode two days prior, lasting 1 hour.  Carotid doppler revealed no hemodynamically significant ICA stenosis.  Lipid panel showed cholesterol of 170, triglycerides of 235, HDL 33 and LDL 90.  Due to stroke and pneumonia, she had undergone deconditioning so she started physical therapy.  She has a walker but doesn't use it all the time.  She does not have a walker now.  She has history of gait problems  after knee surgery, but she has been more unsteady since her prolonged hospital admissions.  She has history of numbness on the bottom of her feet.  Retinal migraines:  Of note, she has longstanding history of migraines, described as scotomas, fortification spectrum and flashing lights in the right eye.  There is no associated headache.  She takes Keppra 750mg  twice daily, Xarelto, Zebeta, Lipitor  PAST MEDICAL HISTORY: Past Medical History  Diagnosis Date  . GERD (gastroesophageal reflux disease)   . History of TIAs   . PUD (peptic ulcer disease)   . PVD (peripheral vascular disease)   . Hyperlipidemia   . Anxiety   . Depression   . Blood transfusion     "w/1st miscarriage" (05/26/2013)  . COPD (chronic obstructive pulmonary disease)   . Hypertension   . Family history of anesthesia complication     "sisters also get sick as a dog" (05/26/2013)  . Coronary artery disease   . Heart murmur     "when I was a child" (05/26/2013)  . DVT (deep venous thrombosis) 1954    "after I had my first child" (05/26/2013)  . Pneumonia 2013    "once" (05/26/2013)  . Exertional shortness of breath   . H/O hiatal hernia   . Stroke 05/26/2013    "very mild; affected my speech for a while" (05/26/2013)  . Arthritis     "lots; hands" (05/26/2013)  . 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 Lymphona, mantle cell, inguinal region/lower limb     "large c-cell" (05/26/2013)  . VARICOSE VEINS LOWER EXTREMITIES W/INFLAMMATION 01/15/2009  . PEPTIC ULCER DISEASE 07/06/2008  . OSTEOARTHRITIS 05/03/2007  . HCAP (healthcare-associated pneumonia) 06/26/2013  . Diverticulitis of colon with hemorrhage 07/24/2008  . ABDOMINAL INCISIONAL HERNIA 04/03/2010       . GERD 05/03/2007         MEDICATIONS: Current Outpatient Prescriptions on File Prior to Visit  Medication Sig Dispense Refill  . acetaminophen (TYLENOL) 650 MG CR tablet Take 650 mg by mouth every 8  (eight) hours as needed for pain.    Marland Kitchen ALPRAZolam (XANAX) 0.25 MG tablet Take 1 tablet (0.25 mg total) by mouth once as needed for anxiety (before procedure). 1 tablet 0  . atorvastatin (LIPITOR) 20 MG tablet Take 1 tablet (20 mg total) by mouth daily. 30 tablet 5  . bisoprolol (ZEBETA) 5 MG tablet Take 1 tablet (5 mg total) by mouth daily. 30 tablet 5  . cholecalciferol (VITAMIN D) 1000 UNITS tablet Take 1,000 Units by mouth daily.    . diclofenac sodium (VOLTAREN) 1 % GEL Apply 2 g topically 4 (four) times daily. 1 Tube 1  . Glucosamine HCl (GLUCOSAMINE PO) Take 1 tablet by mouth 2 (two) times daily.    Marland Kitchen levETIRAcetam (KEPPRA) 750 MG tablet TAKE ONE TABLET BY MOUTH TWICE DAILY 60 tablet 0  . loperamide (IMODIUM) 2 MG capsule Take 2 mg by mouth daily as needed for diarrhea or loose stools.     Alveda Reasons 15 MG  TABS tablet TAKE ONE TABLET BY MOUTH ONCE DAILY WITH SUPPER 30 tablet 5   No current facility-administered medications on file prior to visit.    ALLERGIES: Allergies  Allergen Reactions  . Ativan [Lorazepam] Other (See Comments)    Severe hallucinations   . Propoxyphene Hcl   . Vancomycin   . Codeine Itching  . Penicillins Rash    FAMILY HISTORY: Family History  Problem Relation Age of Onset  . Colon cancer Sister     colon  . Hyperlipidemia Mother   . Stroke Mother   . Cancer Father     mesothelioma    SOCIAL HISTORY: History   Social History  . Marital Status: Married    Spouse Name: N/A  . Number of Children: 6  . Years of Education: N/A   Occupational History  . retired    Social History Main Topics  . Smoking status: Former Smoker -- 2.50 packs/day for 37 years    Types: Cigarettes    Quit date: 09/05/1984  . Smokeless tobacco: Never Used  . Alcohol Use: No  . Drug Use: No  . Sexual Activity: No   Other Topics Concern  . Not on file   Social History Narrative   Married (husband patient of Dr. Yong Channel). 6 children (lost 2 for total 8). 15  grandkids. 6 greatgrandchildren.    Lives with husband. Husband drives. Patient does not drive.       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     REVIEW OF SYSTEMS: Constitutional: No fevers, chills, or sweats, no generalized fatigue, change in appetite Eyes: No visual changes, double vision, eye pain Ear, nose and throat: No hearing loss, ear pain, nasal congestion, sore throat Cardiovascular: No chest pain, palpitations Respiratory:  No shortness of breath at rest or with exertion, wheezes GastrointestinaI: No nausea, vomiting, diarrhea, abdominal pain, fecal incontinence Genitourinary:  No dysuria, urinary retention or frequency Musculoskeletal:  Back and leg pain Integumentary: No rash, pruritus, skin lesions Neurological: as above Psychiatric: anxiety Endocrine: No palpitations, fatigue, diaphoresis, mood swings, change in appetite, change in weight, increased thirst Hematologic/Lymphatic:  No anemia, purpura, petechiae. Allergic/Immunologic: no itchy/runny eyes, nasal congestion, recent allergic reactions, rashes  PHYSICAL EXAM: Filed Vitals:   02/13/15 0911  BP: 112/64  Pulse: 78  Resp: 20   General: No acute distress.  Patient appears well-groomed.   Head:  Normocephalic/atraumatic Eyes:  Fundoscopic exam unremarkable without vessel changes, exudates, hemorrhages or papilledema. Neck: supple, no paraspinal tenderness, full range of motion Heart:  Regular rate and rhythm Lungs:  Clear to auscultation bilaterally Back: No paraspinal tenderness Neurological Exam: alert and oriented to person, place, and time. Attention span and concentration intact, recent and remote memory intact, fund of knowledge intact.  Speech fluent and not dysarthric, language intact.  CN II-XII intact. Fundi not visualized on inspection.  Bulk and tone normal, muscle strength 5/5 in upper extremities and 5-/5 lower extremities.  Deep tendon  reflexes 1+ throughout except absent in ankles.  Toes downgoing.  Finger to nose testing with postural and kinetic tremor but no dysmetria.  Gait mildly wide-based antalgic.  IMPRESSION: Cardioembolic stroke secondary to atrial fibrillation Recurrent episodes of aphasia.  Consider possible symptomatic localization-related epilepsy with simple partial seizures Lumbar spinal stenosis  PLAN: Xarelto Keppra 750mg  twice daily Continue Lipitor (LDL at goal of less than 90) Since she really only has neurogenic pain during the  day when she walks, we will switch her gabapentin to 100mg  capsules and start at low dosing during the day (100mg  three times daily) to minimize side effects Follow up in 3 months. Brief word-finding problems.  Suspect related to anxiety.  15 minutes spent face to face with patient, over 50% spent discussing management.  Metta Clines, DO  CC:  [name], MD

## 2015-02-19 ENCOUNTER — Ambulatory Visit
Admission: RE | Admit: 2015-02-19 | Discharge: 2015-02-19 | Disposition: A | Discharge status: Home / Self Care | Payer: Medicare Other | Source: Ambulatory Visit | Attending: Family Medicine | Admitting: Family Medicine

## 2015-02-19 DIAGNOSIS — M48062 Spinal stenosis, lumbar region with neurogenic claudication: Secondary | ICD-10-CM

## 2015-02-19 MED ORDER — IOHEXOL 180 MG/ML  SOLN
1.0000 mL | Freq: Once | INTRAMUSCULAR | Status: AC | PRN
  Administered 2015-02-19: 1 mL via EPIDURAL

## 2015-02-19 MED ORDER — METHYLPREDNISOLONE ACETATE 40 MG/ML INJ SUSP (RADIOLOG
120.0000 mg | Freq: Once | INTRAMUSCULAR | Status: AC
  Administered 2015-02-19: 120 mg via EPIDURAL

## 2015-02-19 NOTE — Discharge Instructions (Signed)

## 2015-03-01 ENCOUNTER — Other Ambulatory Visit: Payer: Self-pay | Admitting: Family Medicine

## 2015-03-20 ENCOUNTER — Other Ambulatory Visit: Payer: Self-pay | Admitting: Neurology

## 2015-04-11 ENCOUNTER — Ambulatory Visit (INDEPENDENT_AMBULATORY_CARE_PROVIDER_SITE_OTHER): Payer: Medicare Other | Admitting: Family Medicine

## 2015-04-11 ENCOUNTER — Encounter: Payer: Self-pay | Admitting: Family Medicine

## 2015-04-11 VITALS — BP 122/82 | HR 89 | Temp 98.6°F | Wt 195.0 lb

## 2015-04-11 DIAGNOSIS — M4806 Spinal stenosis, lumbar region: Secondary | ICD-10-CM

## 2015-04-11 DIAGNOSIS — M48062 Spinal stenosis, lumbar region with neurogenic claudication: Secondary | ICD-10-CM

## 2015-04-11 DIAGNOSIS — Z23 Encounter for immunization: Secondary | ICD-10-CM | POA: Diagnosis not present

## 2015-04-11 DIAGNOSIS — I10 Essential (primary) hypertension: Secondary | ICD-10-CM

## 2015-04-11 DIAGNOSIS — E785 Hyperlipidemia, unspecified: Secondary | ICD-10-CM | POA: Diagnosis not present

## 2015-04-11 NOTE — Patient Instructions (Addendum)
Received final pneumonia shot today.  We will call you within a week about your referral to neurosurgery. If you do not hear within 2 weeks, give [place] a call.

## 2015-04-11 NOTE — Progress Notes (Signed)
[name], MD  Subjective:  [name] is a 81 y.o. year old very pleasant female patient who presents for/with See problem oriented charting ROS- no falls but pain has made her feel weak, no fecal incontinence, no fever, chills. Does have some paresthesias. No headache or blurry vision or neck stiffness.   Past Medical History-  Patient Active Problem List   Diagnosis Date Noted  . History of cardioembolic stroke 19/50/9326    Priority: High  . Focal seizure 06/16/2013    Priority: High  . TIA (transient ischemic attack) 05/31/2013    Priority: High  . Large B-cell lymphoma 02/18/2012    Priority: High  . Atrial fibrillation 12/14/2008    Priority: High  . Diverticulitis of colon 09/26/2014    Priority: Medium  . CKD (chronic kidney disease), stage III 09/26/2014    Priority: Medium  . Laryngeal carcinoma     Priority: Medium  . Anxiety state 10/20/2008    Priority: Medium  . Depression 10/20/2008    Priority: Medium  . COPD (chronic obstructive pulmonary disease) 03/26/2008    Priority: Medium  . Hyperlipidemia 03/18/2007    Priority: Medium  . Essential hypertension 03/18/2007    Priority: Medium  . Spinal stenosis, lumbar region, with neurogenic claudication 05/09/2014    Priority: Low  . Weakness generalized 06/30/2013    Priority: Low  . Bilateral hand pain 01/09/2013    Priority: Low  . 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 atorvastatin (LIPITOR) 20 MG tablet TAKE ONE TABLET BY MOUTH ONCE DAILY 30 tablet 6  . bisoprolol (ZEBETA) 5 MG tablet Take 1 tablet (5 mg total) by mouth daily. 30 tablet 5  . cholecalciferol (VITAMIN D) 1000 UNITS tablet Take 1,000 Units by mouth daily.    . diclofenac sodium (VOLTAREN) 1 % GEL Apply 2 g topically 4 (four) times daily. 1 Tube 1  . gabapentin (NEURONTIN) 300 MG capsule      . Glucosamine HCl (GLUCOSAMINE PO) Take 1 tablet by mouth 2 (two) times daily.    Marland Kitchen levETIRAcetam (KEPPRA) 750 MG tablet TAKE ONE TABLET BY MOUTH TWICE DAILY 60 tablet 0  . loperamide (IMODIUM) 2 MG capsule Take 2 mg by mouth daily as needed for diarrhea or loose stools.     Alveda Reasons 15 MG TABS tablet TAKE ONE TABLET BY MOUTH ONCE DAILY WITH SUPPER 30 tablet 5  . acetaminophen (TYLENOL) 650 MG CR tablet Take 650 mg by mouth every 8 (eight) hours as needed for pain.    Marland Kitchen ALPRAZolam (XANAX) 0.25 MG tablet Take 1 tablet (0.25 mg total) by mouth once as needed for anxiety (before procedure). (Patient not taking: Reported on 04/11/2015) 1 tablet 0   No current facility-administered medications for this visit.    Objective: BP 122/82 mmHg  Pulse 89  Temp(Src) 98.6 F (37 C)  Wt 195 lb (88.451 kg)  SpO2 87% Gen: NAD, resting comfortably in chair, slow to rise, uses cane CV: RRR no murmurs rubs or gallops Lungs: CTAB no crackles, wheeze, rhonchi Abdomen: soft/nontender/nondistended/normal bowel sounds.  Ext: trace edema, 5/5 leg strength against my resistance Skin: warm, dry, no rash   Assessment/Plan:  Essential hypertension S: controlled. On bisoprolol 5mg  BP Readings from Last 3 Encounters:  04/11/15 122/82  02/19/15 130/81  02/13/15 112/64  A/P:Continue current meds   Hyperlipidemia S: mild poor control with goal 70  given history CVA. CVA likely cardioembolic from a fib though so 100 reasonable goal.  A/P: continue atorvastatin 20mg    Spinal stenosis, lumbar region, with neurogenic claudication S: Severe pain in low back with walking or sitting. Walking is certainly worse. She has pain radiating down left leg. She has had epidural injections without relief past about a month though did initially help. Gabapentin did not help and just makes her sleepy- she does not want to go up on dose (1400mg  max per day with her CKD). Tramadol sick on stomach, hydrocodone sick on stomach.  Ibuprofen should not be used on xarelto. Tylenol does not help.  MRI 05/07/14 1. Progressive disc degeneration at L4-5 with annular disc bulging contributing to moderate multifactorial spinal stenosis. 2. Lesser spondylosis at the other levels is stable. There is mild spinal stenosis at L3-4 and L5-S1. 3. Acute appearing Schmorl's node involving the superior endplate of L5 with associated surrounding marrow edema. This may contribute to axial back pain. Chronic Schmorl's nodes at L3 and L4 are stable. A/P: Patient is very frustrated by her pain. Has had multiple visits to sports medicine when originally not interested in surgical options. I do not think she is the best surgical candidate with a fib history, history of cardioembolic stroke, CKD III but she would like to pursue options. Referred to neurosurgery at this time.   PRN f/u for back issue. At least every 3-6 months to see me  Orders Placed This Encounter  Procedures  . Pneumococcal conjugate vaccine 13-valent  . Ambulatory referral to Neurosurgery    Referral Priority:  Routine    Referral Type:  Surgical    Referral Reason:  Specialty Services Required    Requested Specialty:  Neurosurgery    Number of Visits Requested:  1

## 2015-04-12 NOTE — Assessment & Plan Note (Signed)
S: mild poor control with goal 70 given history CVA. CVA likely cardioembolic from a fib though so 100 reasonable goal.  A/P: continue atorvastatin 20mg 

## 2015-04-12 NOTE — Assessment & Plan Note (Signed)
S: controlled. On bisoprolol 5mg  BP Readings from Last 3 Encounters:  04/11/15 122/82  02/19/15 130/81  02/13/15 112/64  A/P:Continue current meds

## 2015-04-12 NOTE — Assessment & Plan Note (Addendum)
S: Severe pain in low back with walking or sitting. Walking is certainly worse. She has pain radiating down left leg. She has had epidural injections without relief past about a month though did initially help. Gabapentin did not help and just makes her sleepy- she does not want to go up on dose (1400mg  max per day with her CKD). Tramadol sick on stomach, hydrocodone sick on stomach. Ibuprofen should not be used on xarelto. Tylenol does not help.  MRI 05/07/14 1. Progressive disc degeneration at L4-5 with annular disc bulging contributing to moderate multifactorial spinal stenosis. 2. Lesser spondylosis at the other levels is stable. There is mild spinal stenosis at L3-4 and L5-S1. 3. Acute appearing Schmorl's node involving the superior endplate of L5 with associated surrounding marrow edema. This may contribute to axial back pain. Chronic Schmorl's nodes at L3 and L4 are stable. A/P: Patient is very frustrated by her pain. Has had multiple visits to sports medicine when originally not interested in surgical options. I do not think she is the best surgical candidate with a fib history, history of cardioembolic stroke, CKD III but she would like to pursue options. Referred to neurosurgery at this time.

## 2015-04-15 ENCOUNTER — Other Ambulatory Visit: Payer: Self-pay | Admitting: Neurology

## 2015-04-22 ENCOUNTER — Ambulatory Visit (HOSPITAL_BASED_OUTPATIENT_CLINIC_OR_DEPARTMENT_OTHER): Payer: Medicare Other | Admitting: Oncology

## 2015-04-22 ENCOUNTER — Telehealth: Payer: Self-pay | Admitting: Oncology

## 2015-04-22 VITALS — BP 86/55 | HR 85 | Temp 98.0°F | Resp 19 | Ht 65.0 in | Wt 192.2 lb

## 2015-04-22 DIAGNOSIS — C8510 Unspecified B-cell lymphoma, unspecified site: Secondary | ICD-10-CM | POA: Diagnosis not present

## 2015-04-22 NOTE — Telephone Encounter (Signed)
Pt confirmed MD visit per 09/19 POF, gave pt AVS and Calendar... KJ

## 2015-04-22 NOTE — Progress Notes (Signed)
  [place] OFFICE PROGRESS NOTE   Diagnosis: Non-Hodgkin's lymphoma  INTERVAL HISTORY:    [name] returns as scheduled. No fever, night sweats, or anorexia. She complains of chronic back pain and underwent an MRI earlier today.  Objective:  Vital signs in last 24 hours:  Blood pressure 86/55, pulse 85, temperature 98 F (36.7 C), temperature source Oral, resp. rate 19, height 5\' 5"  (1.651 m), weight 192 lb 3.2 oz (87.181 kg), SpO2 98 %.    HEENT:  Neck without mass Lymphatics:  No cervical, supraclavicular, axillary, or inguinal nodes Resp:  Lungs clear bilaterally Cardio:  Regular rate and rhythm GI:  No hepatosplenomegaly Vascular:  No leg edema  neurologic: the leg strength is intact bilaterally  Medications: I have reviewed the patient's current medications.  Assessment/Plan: 1. Non-Hodgkin's lymphoma, large B-cell lymphoma, CD20 positive involving 8 needle core biopsy of a right iliac lymph node 02/12/2012. CT of the abdomen and pelvis on 01/15/2012 showed abdominal/pelvic lymphadenopathy. Serum LDH in normal range on 02/25/2012. Staging PET scan 02/25/2012 with a small lymph node posteriolateral to the oropharynx on the left measuring 9 x 7 mm with SUV 5.4; large cluster of hypermetabolic lymph nodes in the right hilum with SUV 4.3; cluster of mildly enlarged hepatoduodenal ligament lymph nodes and mildly enlarged portacaval lymph node with hypermetabolic activity (SUV max 37.1); numerous borderline enlarged and mildly enlarged periaortic retroperitoneal lymph nodes with extensive hypermetabolic activity (SUV max 43.6-47.6); numerous borderline enlarged and mildly enlarged hypermetabolic lymph nodes along the pelvic sidewall bilaterally most pronounced on the right with a right external iliac lymph node measuring 2.9 x 4.1 cm (SUV max 52.5). Bone marrow aspiration/biopsy 03/09/2012 negative for involvement with lymphoma. Status post cycle 1 of CHOP-Rituxan on  03/15/2012. She completed cycle 3 on 05/04/2012 and cycle 4 on 05/25/2012. Restaging PET scan on 06/10/2012 was markedly improved with resolution of the thoracic and retroperitoneal adenopathy and near complete resolution of porta hepatis adenopathy. She completed cycle 6 of CHOP/Rituxan on 07/06/2012. 2. History of night sweats likely secondary to #1. 3. Rectal bleeding June 2013 status post negative colonoscopy 01/28/2012 and upper endoscopy 02/11/2012. The bleeding was likely related to hemorrhoids. 4. COPD. 5. Remote history of larynx cancer. 6. Status post Port-A-Cath placement 03/09/2012. Removed 09/13/2012. 7. 2-D echo 03/11/2012 with left ventricular ejection fraction 60-65%. 8. Admission with febrile neutropenia 03/22/2012 with no source for infection identified 9. Admission with fever and a chest x-ray consistent with "pneumonia"on 03/27/2012. She completed a course of Levaquin. 10. Pancytopenia following cycle 1 of CHOP-rituximab-resolved. 11. History of atrial fibrillation-maintained on xarelto 12. Numbness/tingling in the fingertips and toes. The numbness in the toes predated the start of chemotherapy.. She also reports the tingling in the fingertips was present prior to the start of chemotherapy. The vincristine was dose reduced with cycle 2. Vincristine was discontinued beginning with cycle 3. 13. Cardioembolic strokes, seizures 2014. She is followed by neurology. 78. Hospitalization with pneumonia November 2014.    Disposition:   [name] remains in clinical remission from  Non-Hodgkin's lymphoma. She will return for an office visit in 8 months.  Betsy Coder, MD  04/22/2015  12:04 PM

## 2015-04-23 ENCOUNTER — Other Ambulatory Visit: Payer: Self-pay | Admitting: Family Medicine

## 2015-05-06 ENCOUNTER — Ambulatory Visit (INDEPENDENT_AMBULATORY_CARE_PROVIDER_SITE_OTHER): Payer: Medicare Other | Admitting: Family Medicine

## 2015-05-06 ENCOUNTER — Encounter: Payer: Self-pay | Admitting: Family Medicine

## 2015-05-06 VITALS — BP 132/70 | HR 81 | Temp 97.4°F | Wt 196.0 lb

## 2015-05-06 DIAGNOSIS — Z01818 Encounter for other preprocedural examination: Secondary | ICD-10-CM

## 2015-05-06 DIAGNOSIS — I480 Paroxysmal atrial fibrillation: Secondary | ICD-10-CM | POA: Diagnosis not present

## 2015-05-06 DIAGNOSIS — Z23 Encounter for immunization: Secondary | ICD-10-CM

## 2015-05-06 NOTE — Patient Instructions (Addendum)
Flu shot received today.  We will call you within a week about your referral to cardiology for potential clearance for surgery. If you do not hear within 2 weeks, give [place] a call.

## 2015-05-06 NOTE — Progress Notes (Signed)
BP 132/70 mmHg  Pulse 81  Temp(Src) 97.4 F (36.3 C)  Wt 196 lb (88.905 kg)  SpO2 93%   Patient seen for preoperative evaluation. I believe patient is at high risk for poor outcome after surgery. With her history of atrial fibrillation and cardioembolic stroke as well as TIA, I think she is at high risk for CVA if she undergoes this procedure and comes off xarelto. She has not had a recent episode of expressive aphasia but for some time was having extreme difficulty with this that may have been TIA but could also have been focal seizure- she is on Keppra 750mg  BID which has improved frequency which makes seizure more likely.    In addition, She is not able to complete enough METS for me to be able to clear her from a cardiac perspective. For this reason, we have referred her to cardiology to evaluate both above issues and will need their clearance. Otherwise, she is medically maximized for surgery (on atorvastatin for hyperlipidemia, blood pressure controlled on bisoprolol, history COPD but does not even have to use albuterol and not smoking) but need cardiology opinion.  Quality of life is important to patient and she thinks this surgery will benefit her. With her age and risk factors, I simply cannot recommend moving forward without further cardiac evaluation. Sent a letter to Dr. Ronnald Ramp of neurosurgery to that regard. Patient is very upset about this decision. I told patient her health and life are my top priority here and that is why she needs further evaluation. She is aware could take 1-2 months.   >50% of 20 minute office visit was spent on counseling (discussing surgical clearance and medical maximization, my concern about her surgery) and coordination of care

## 2015-05-07 ENCOUNTER — Other Ambulatory Visit: Payer: Self-pay | Admitting: Neurological Surgery

## 2015-05-07 DIAGNOSIS — R5381 Other malaise: Secondary | ICD-10-CM

## 2015-05-10 ENCOUNTER — Ambulatory Visit (INDEPENDENT_AMBULATORY_CARE_PROVIDER_SITE_OTHER): Payer: Medicare Other | Admitting: Neurology

## 2015-05-10 ENCOUNTER — Encounter: Payer: Self-pay | Admitting: Neurology

## 2015-05-10 VITALS — BP 140/70 | HR 66 | Ht 64.0 in | Wt 196.0 lb

## 2015-05-10 DIAGNOSIS — M48062 Spinal stenosis, lumbar region with neurogenic claudication: Secondary | ICD-10-CM

## 2015-05-10 DIAGNOSIS — Z8673 Personal history of transient ischemic attack (TIA), and cerebral infarction without residual deficits: Secondary | ICD-10-CM

## 2015-05-10 DIAGNOSIS — I10 Essential (primary) hypertension: Secondary | ICD-10-CM

## 2015-05-10 DIAGNOSIS — G40109 Localization-related (focal) (partial) symptomatic epilepsy and epileptic syndromes with simple partial seizures, not intractable, without status epilepticus: Secondary | ICD-10-CM

## 2015-05-10 DIAGNOSIS — E785 Hyperlipidemia, unspecified: Secondary | ICD-10-CM

## 2015-05-10 DIAGNOSIS — M4806 Spinal stenosis, lumbar region: Secondary | ICD-10-CM | POA: Diagnosis not present

## 2015-05-10 NOTE — Patient Instructions (Signed)
Xarelto Keppra 750mg  twice daily Lipitor Let it be known that stopping Xarelto does increase risk for stroke. Given the tiny size of the stroke and the deterioration of your walking, and the fact that you failed conservative treatment, surgery should be considered.  But you must weigh risks and benefits.  I would get opinion of cardiology. Follow up in 6 months.

## 2015-05-10 NOTE — Progress Notes (Signed)
NEUROLOGY FOLLOW UP OFFICE NOTE  [name] [id number]  HISTORY OF PRESENT ILLNESS: [name] is an 81 year old right-handed woman with history of hypertension, atrial fibrillation, peripheral vascular disease, anxiety, depression, B Cell lymphoma status post chemotherapy, and COPD who follows up for recurrent aphasia, cardio-embolic stroke and symptomatic seizure secondary to stroke, and lumbar stenosis.  She is accompanied by her husband who provides history.  UPDATE: Recurrent episodes of aphasia.  She is taking Keppra 750mg  twice daily to address possibility that the recurrent episodes of aphasia are seizures.  She has not had any recurrent spells.    Lumbar stenosis:  Both epidural injections and gabapentin have been ineffective.  She has had worsening back pain with neurogenic claudication.  She is unable to ambulate without a walker.  She reportedly had a repeat MRI of the lumbar spine which showed progression of stenosis and degeneration.  She saw a neurosurgeon, Dr. Ronnald Ramp, who would like to perform surgery.  Her PCP would like cardiac clearance first, given her atrial fibrillation and history of stroke.  HISTORY: Since childhood, she has episodes of numbness in either of both hands and inability to get words out.  It lasts for about 2 minutes.  It should be known that she has had these episodes since childhood.  Sometimes it was associated with headache.  She had them about twice a year until young adulthood, when they resolved.  Last fall, she had several severe episodes and was started on Keppra at that time, for possible seizures.    She has had several episodes of confusion and aphasia in October and November of 2014, associated with stroke and presumed seizure.  On 05/26/13, MRI of the brain was performed, which revealed an acute lacunar infarct in the left anterior corona radiata and lentiform nucleus.  CTA of the head revealed atherosclerotic changes in the cavernous  carotid arteries without significant stenosis.  CTA of neck revealed focal 50% stenosis of mid right ICA.  She was switched from [name] to Eliquis.  She was readmitted to the hospital on 05/31/13 with recurrent aphasia.  She had trouble getting her words out and then couldn't speak at all.  Repeat MRI of brain revealed new punctate infarct in the left posterior corona radiata, the same location as previous infarct. No changes in management were made.  She was readmitted to the hospital on 06/07/13 for altered mental status.  EEG was performed, showing left fronto-parieto-temporal theta slowing.  Due to possibility of seizure, she was started on Keppra 250mg  BID.  No change made to Eliquis.  She presented to the ED on 06/16/13 after waking up in the morning with expressive aphasia and right-sided weakness.  Symptoms improved in the ED.  CT head revealed prior lacunar infarct in left lentiform nucleus and corona radiata, but no new abnormalities.  It was suspected that she may have had another seizure and her Keppra was increased to 500mg  BID.  She was admitted to the hospital again on 06/21/13 again for expressive aphasia.  MRI brain revealed 2 new punctate infarcts in the bilateral hemispheres at level of centrum semiovale.  Eliquis was changed to Xarelto. Keppra was increased to 750mg  BID.    She presented to the ED on 08/05/14 for recurrent episode of aphasia, lasting 1.5 hours.  This was more significant than last year.  She described it as knowing what she wanted to say but having trouble getting the words out.  She had trouble writing as well.  MRI of brain revealed no acute infarct.  She had an episode two days prior, lasting 1 hour.  Carotid doppler revealed no hemodynamically significant ICA stenosis.  Lipid panel showed cholesterol of 170, triglycerides of 235, HDL 33 and LDL 90.  Due to stroke and pneumonia, she had undergone deconditioning so she started physical therapy.  She has a walker but doesn't  use it all the time.  She does not have a walker now.  She has history of gait problems after knee surgery, but she has been more unsteady since her prolonged hospital admissions.  She has history of numbness on the bottom of her feet.  Retinal migraines:  Of note, she has longstanding history of migraines, described as scotomas, fortification spectrum and flashing lights in the right eye.  There is no associated headache.  She takes Keppra 750mg  twice daily, Xarelto, Zebeta, Lipitor  PAST MEDICAL HISTORY: Past Medical History  Diagnosis Date  . GERD (gastroesophageal reflux disease)   . History of TIAs   . PUD (peptic ulcer disease)   . PVD (peripheral vascular disease) ([place])   . Hyperlipidemia   . Anxiety   . Depression   . Blood transfusion     "w/1st miscarriage" (05/26/2013)  . COPD (chronic obstructive pulmonary disease) ([place])   . Hypertension   . Family history of anesthesia complication     "sisters also get sick as a dog" (05/26/2013)  . Coronary artery disease   . Heart murmur     "when I was a child" (05/26/2013)  . DVT (deep venous thrombosis) ([place]) 1954    "after I had my first child" (05/26/2013)  . Pneumonia 2013    "once" (05/26/2013)  . Exertional shortness of breath   . H/O hiatal hernia   . Stroke ([place]) 05/26/2013    "very mild; affected my speech for a while" (05/26/2013)  . Arthritis     "lots; hands" (05/26/2013)  . Laryngeal carcinoma ([place]) hx of ca    remotely with resection and xrt/chronic hoarsemenss  . Lymphona, mantle cell, inguinal region/lower limb ([place])     "large c-cell" (05/26/2013)  . VARICOSE VEINS LOWER EXTREMITIES W/INFLAMMATION 01/15/2009  . PEPTIC ULCER DISEASE 07/06/2008  . OSTEOARTHRITIS 05/03/2007  . HCAP (healthcare-associated pneumonia) 06/26/2013  . Diverticulitis of colon with hemorrhage 07/24/2008  . ABDOMINAL INCISIONAL HERNIA 04/03/2010       . GERD 05/03/2007         MEDICATIONS: Current Outpatient Prescriptions on File  Prior to Visit  Medication Sig Dispense Refill  . acetaminophen (TYLENOL) 650 MG CR tablet Take 650 mg by mouth every 8 (eight) hours as needed for pain.    Marland Kitchen ALPRAZolam (XANAX) 0.25 MG tablet Take 1 tablet (0.25 mg total) by mouth once as needed for anxiety (before procedure). 1 tablet 0  . atorvastatin (LIPITOR) 20 MG tablet TAKE ONE TABLET BY MOUTH ONCE DAILY 30 tablet 6  . bisoprolol (ZEBETA) 5 MG tablet Take 1 tablet (5 mg total) by mouth daily. 30 tablet 5  . cholecalciferol (VITAMIN D) 1000 UNITS tablet Take 1,000 Units by mouth daily.    . diclofenac sodium (VOLTAREN) 1 % GEL Apply 2 g topically 4 (four) times daily. 1 Tube 1  . gabapentin (NEURONTIN) 100 MG capsule Take 100 mg by mouth 3 (three) times daily.    . Glucosamine HCl (GLUCOSAMINE PO) Take 1 tablet by mouth daily.     Marland Kitchen levETIRAcetam (KEPPRA) 750 MG tablet TAKE ONE TABLET BY MOUTH TWICE DAILY  60 tablet 5  . loperamide (IMODIUM) 2 MG capsule Take 2 mg by mouth daily as needed for diarrhea or loose stools.     Alveda Reasons 15 MG TABS tablet TAKE ONE TABLET BY MOUTH ONCE DAILY WITH SUPPER 30 tablet 5   No current facility-administered medications on file prior to visit.    ALLERGIES: Allergies  Allergen Reactions  . Ativan [Lorazepam] Other (See Comments)    Severe hallucinations   . Propoxyphene Hcl   . Vancomycin   . Codeine Itching  . Penicillins Rash    FAMILY HISTORY: Family History  Problem Relation Age of Onset  . Colon cancer Sister     colon  . Hyperlipidemia Mother   . Stroke Mother   . Cancer Father     mesothelioma    SOCIAL HISTORY: Social History   Social History  . Marital Status: Married    Spouse Name: N/A  . Number of Children: 6  . Years of Education: N/A   Occupational History  . retired    Social History Main Topics  . Smoking status: Former Smoker -- 2.50 packs/day for 37 years    Types: Cigarettes    Quit date: 09/05/1984  . Smokeless tobacco: Never Used  . Alcohol Use: No    . Drug Use: No  . Sexual Activity: No   Other Topics Concern  . Not on file   Social History Narrative   Married (husband patient of Dr. Yong Channel). 6 children (lost 2 for total 8). 15 grandkids. 6 greatgrandchildren.    Lives with husband. Husband drives. Patient does not drive.       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     REVIEW OF SYSTEMS: Constitutional: No fevers, chills, or sweats, no generalized fatigue, change in appetite Eyes: No visual changes, double vision, eye pain Ear, nose and throat: No hearing loss, ear pain, nasal congestion, sore throat Cardiovascular: No chest pain, palpitations Respiratory:  No shortness of breath at rest or with exertion, wheezes GastrointestinaI: No nausea, vomiting, diarrhea, abdominal pain, fecal incontinence Genitourinary:  No dysuria, urinary retention or frequency Musculoskeletal:  No neck pain, back pain Integumentary: No rash, pruritus, skin lesions Neurological: as above Psychiatric: No depression, insomnia, anxiety Endocrine: No palpitations, fatigue, diaphoresis, mood swings, change in appetite, change in weight, increased thirst Hematologic/Lymphatic:  No anemia, purpura, petechiae. Allergic/Immunologic: no itchy/runny eyes, nasal congestion, recent allergic reactions, rashes  PHYSICAL EXAM: Filed Vitals:   05/10/15 0753  BP: 140/70  Pulse: 66   General: No acute distress.  Patient appears well-groomed.   Head:  Normocephalic/atraumatic Eyes:  Fundoscopic exam unremarkable without vessel changes, exudates, hemorrhages or papilledema. Neck: supple, no paraspinal tenderness, full range of motion Heart:  Regular rate and rhythm Lungs:  Clear to auscultation bilaterally Back: No paraspinal tenderness Neurological Exam: alert and oriented to person, place, and time. Attention span and concentration intact, recent and remote memory intact, fund of knowledge intact.   Speech fluent and not dysarthric, language intact.  CN II-XII intact. Fundi not visualized on inspection.  Bulk and tone normal, muscle strength 5/5 in upper extremities and 5-/5 lower extremities.  Deep tendon reflexes 1+ throughout except absent in ankles.  Toes downgoing.  Finger to nose testing with postural and kinetic tremor but no dysmetria.  Unsteady wide-based stance.  Difficult to walk and cautious when taking short strides.  IMPRESSION: Cardioembolic stroke secondary to atrial  fibrillation Recurrent episodes of aphasia.  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  Progressed.  She clearly has deterioration of gait compared to prior evaluations. Hyperlipidemia hypertension  PLAN: Xarelto Keppra 750mg  twice daily Continue Lipitor  Blood pressure control Since the stroke was tiny, she has failed conservative treatment for her stenosis, and she has had deterioration of her gait and with increased pain, surgery should be considered.  However, I let her be known that discontinuing Xarelto will increase her risk for stroke.  The risks and benefits must be considered.   22 minutes spent face to face with patient, over 50% spent discussing diagnosis, increased risk for stroke if discontinuing Xarelto, and management.   Metta Clines, DO  CC:  [name], MD

## 2015-05-14 ENCOUNTER — Ambulatory Visit: Payer: Medicare Other | Admitting: Family Medicine

## 2015-05-23 NOTE — Progress Notes (Signed)
Cardiology Office Note   Date:  05/25/2015   ID:  [name], DOB June 04, 1928, MRN [id number]  PCP:  [name], MD  Cardiologist:   Sharol Harness, MD   Chief Complaint  Patient presents with  . New Evaluation    pt c/o feeling dizzy if getting up to fast  . Chest Pain    have some discomfort  . Shortness of Breath    sometimes when going up and down the steps  . Edema    both feet      History of Present Illness: [name] is a 81 y.o. female with paroxysmal atrial fibrillation, mild AS, moderate MR, ischemic stroke with residual seizures and aphasia, hypertension, COPD, CKD 3, hyperlipidemia, and B Cell Lymphoma who presents for pre-operative risk assessment.  [name] is preparing for spine surgery with Dr. Sherley Bounds.  She has been barely able to walk for 2-3 years due to back and leg pain.  He is willing to do the surgery if Xarelto can be held.  [name] was diagnosed with atrial fibrillation approximately 6 years ago.  She also has a history of recurrent cardioembolic stroke with expressive aphasia.  In November 2014 she had an acute lacunar infarct that resulted in confusion and aphasia.  She was switched from Pradaxa to Eliquis.  Five days later she was admitted with new lesions on MRI in the same region.  In November 2014 she had transient expressive aphasia and right-sided weakness and was found to have 2 punctate infarcts in bilateral hemispheres at the level of the centrum semiovale.  She was switched from Eliquis to Xarelto.    [name] denies any exertional chest pain or shortness of breath. She has been placing a tile backsplash and reports some arm and chest soreness since starting this project a week ago.  The pain occurs when moving her arm.  She denies any chest pain or shortness of breath with exertion.  She is limited by her bilateral leg pain and is unable to stand or walk for prolonged periods of time. .   [name] is in  remission from both her lymphoma and laryngeal cancer.  She notes mild LE edema that improves with wearing compression stockings.  She very occasionally uses a diuretic.  She denies orthopnea or PND.  [name] is originally from [place] and retired in [place].  She later moved to [place] because her three daughters live here.   Past Medical History  Diagnosis Date  . GERD (gastroesophageal reflux disease)   . History of TIAs   . PUD (peptic ulcer disease)   . PVD (peripheral vascular disease) ([place])   . Hyperlipidemia   . Anxiety   . Depression   . Blood transfusion     "w/1st miscarriage" (05/26/2013)  . COPD (chronic obstructive pulmonary disease) ([place])   . Hypertension   . Family history of anesthesia complication     "sisters also get sick as a dog" (05/26/2013)  . Coronary artery disease   . Heart murmur     "when I was a child" (05/26/2013)  . DVT (deep venous thrombosis) ([place]) 1954    "after I had my first child" (05/26/2013)  . Pneumonia 2013    "once" (05/26/2013)  . Exertional shortness of breath   . H/O hiatal hernia   . Stroke ([place]) 05/26/2013    "very mild; affected my speech for a while" (05/26/2013)  . Arthritis     "  lots; hands" (05/26/2013)  . Laryngeal carcinoma ([place]) hx of ca    remotely with resection and xrt/chronic hoarsemenss  . Lymphona, mantle cell, inguinal region/lower limb ([place])     "large c-cell" (05/26/2013)  . VARICOSE VEINS LOWER EXTREMITIES W/INFLAMMATION 01/15/2009  . PEPTIC ULCER DISEASE 07/06/2008  . OSTEOARTHRITIS 05/03/2007  . HCAP (healthcare-associated pneumonia) 06/26/2013  . Diverticulitis of colon with hemorrhage 07/24/2008  . ABDOMINAL INCISIONAL HERNIA 04/03/2010       . GERD 05/03/2007         Past Surgical History  Procedure Laterality Date  . 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 Tubal ligation  1972  . Radical neck dissection  1986    "larynx cancer"  (05/26/2013)  . Total knee arthroplasty Bilateral     2002 left 2008 right  . Ileostomy closure  01/2010  . Cataract extraction  2005    bilateral  . Carotid endarterectomy Bilateral 2004    2004 a month apart right and left  . Tonsillectomy and adenoidectomy  1936    "tonsils grew back; no 2nd OR" (05/26/2013)  . Hernia repair  05/2010    ventral hernia repair  . Carpal tunnel release Left ?1960's  . Abdominal exploration surgery  12/01/2012  . Appendectomy  09/13/2008  . Colon surgery  09/13/2008    Laparoscopic assisted converted to open sigmoid colectomy.Archie Endo 09/13/2008 (05/26/2013)  . Dilation and curettage of uterus  1950's    "had 2 for miscarriages" (05/26/2013)     Current Outpatient Prescriptions  Medication Sig Dispense Refill  . acetaminophen (TYLENOL) 650 MG CR tablet Take 650 mg by mouth every 8 (eight) hours as needed for pain.    Marland Kitchen ALPRAZolam (XANAX) 0.25 MG tablet Take 1 tablet (0.25 mg total) by mouth once as needed for anxiety (before procedure). 1 tablet 0  . atorvastatin (LIPITOR) 20 MG tablet TAKE ONE TABLET BY MOUTH ONCE DAILY 30 tablet 6  . bisoprolol (ZEBETA) 5 MG tablet Take 1 tablet (5 mg total) by mouth daily. 30 tablet 5  . cholecalciferol (VITAMIN D) 1000 UNITS tablet Take 1,000 Units by mouth daily.    . diclofenac sodium (VOLTAREN) 1 % GEL Apply 2 g topically 4 (four) times daily. 1 Tube 1  . gabapentin (NEURONTIN) 100 MG capsule Take 100 mg by mouth 3 (three) times daily.    . Glucosamine HCl (GLUCOSAMINE PO) Take 1 tablet by mouth daily.     Marland Kitchen levETIRAcetam (KEPPRA) 750 MG tablet TAKE ONE TABLET BY MOUTH TWICE DAILY 60 tablet 5  . loperamide (IMODIUM) 2 MG capsule Take 2 mg by mouth daily as needed for diarrhea or loose stools.     Alveda Reasons 15 MG TABS tablet TAKE ONE TABLET BY MOUTH ONCE DAILY WITH SUPPER 30 tablet 5   No current facility-administered medications for this visit.    Allergies:   Ativan; Propoxyphene hcl; Vancomycin; Codeine; and  Penicillins    Social History:  The patient  reports that she quit smoking about 30 years ago. Her smoking use included Cigarettes. She has a 92.5 pack-year smoking history. She has never used smokeless tobacco. She reports that she does not drink alcohol or use illicit drugs.   Family History:  The patient's family history includes Cancer in her father; Colon cancer in her sister; Hyperlipidemia in her mother; Stroke in her mother.    ROS:  Please see the history of present illness.   Otherwise, review of systems  are positive for none.   All other systems are reviewed and negative.    PHYSICAL EXAM: VS:  BP 134/76 mmHg  Pulse 75  Ht 5\' 4"  (1.626 m)  Wt 87.363 kg (192 lb 9.6 oz)  BMI 33.04 kg/m2 , BMI Body mass index is 33.04 kg/(m^2). GENERAL:  Well appearing HEENT:  Pupils equal round and reactive, fundi not visualized, oral mucosa unremarkable NECK:  No jugular venous distention, waveform within normal limits, carotid upstroke brisk and symmetric, no bruits, no thyromegaly LYMPHATICS:  No cervical adenopathy LUNGS:  Clear to auscultation bilaterally HEART:  RRR.  PMI not displaced or sustained,S1 and S2 within normal limits, no S3, no S4, no clicks, no rubs, II/VI holosystolic murmur ABD:  Flat, positive bowel sounds normal in frequency in pitch, no bruits, no rebound, no guarding, no midline pulsatile mass, no hepatomegaly, no splenomegaly EXT:  2 plus pulses throughout, trace edema, no cyanosis no clubbing SKIN:  No rashes no nodules NEURO:  Cranial nerves II through XII grossly intact, motor grossly intact throughout PSYCH:  Cognitively intact, oriented to person place and time   EKG:  EKG is ordered today. The ekg ordered today demonstrates sinus rhythm at 75 bpm.  Cannot rule out prior anteroseptal infarct.  Carotid doppler 08/10/14:  1-39% stenosis in bilateral internal carotid arteries.  Echo 08/10/14: Study Conclusions  - Left ventricle: The cavity size was mildly  dilated. There was mild focal basal hypertrophy of the septum. Systolic function was normal. The estimated ejection fraction was in the range of 55% to 60%. - Aortic valve: There was mild stenosis. - Mitral valve: There was moderate regurgitation. - Left atrium: The atrium was moderately dilated. - Right atrium: The atrium was mildly dilated. - Atrial septum: No defect or patent foramen ovale was identified.  Recent Labs: 08/05/2014: ALT 17; BUN 15; Creatinine, Ser 1.07; Hemoglobin 12.7; Platelets 186; Potassium 4.6; Sodium 139    Lipid Panel    Component Value Date/Time   CHOL 170 08/07/2014 0744   TRIG 235* 08/07/2014 0744   HDL 33* 08/07/2014 0744   CHOLHDL 5.2 08/07/2014 0744   VLDL 47* 08/07/2014 0744   LDLCALC 90 08/07/2014 0744   LDLDIRECT 100.9 02/13/2010 0838      Wt Readings from Last 3 Encounters:  05/24/15 87.363 kg (192 lb 9.6 oz)  05/10/15 88.905 kg (196 lb)  05/06/15 88.905 kg (196 lb)      ASSESSMENT AND PLAN:  # Pre-surgical risk assessment: Dr. Ronnald Ramp requested pre-surgical risk assessment prior to spinal surgery. On the surface, this patient's CHA2DS2-VASc Score and unadjusted Ischemic Stroke Rate (% per year) is equal to 7.2 % stroke rate/year from a score of 5. (score calculated as 1 point each if present [CHF, HTN, DM, Vascular=MI/PAD/Aortic Plaque, Age if 53-74, or Female and 2 points each if present [Age > 75, or Stroke/TIA/TE].  This indicated that her daily risk of stroke while holding anticoagulation is 0.02% or a 5 day risk of 0.1%. However, this likely underestimates her risk, as she has has had recurrent events on anticoagulation.  We discussed the fact that her risk is likely higher than that.  She reports that her quality of life is so poor that she is willing to accept that risk.  Unfortunately there are not any randomized studies or guidelines for these situations.  Assuming that her risk is 10-fold higher than average, this would still be 1%.   Therefore, I think it is reasonable for her to undergo surgery,  understanding that she is at a higher risk than average.  Ideally, Xarelto would be held for 48 hours (>5 half lives) and she would be bridged with heparin.  However, her Xarelto can be held up to 5 days (if necessary) while bridging with heparin..  Her pre-operative surgical risk of MI or SCD is 0.97%, which is low risk.  Therefore, she does not require any additional testing at this time.  # Chest pain: This is likely musculoskeletal. She does not have exertional symptoms and it only occurs when moving her arm.  No ischemia work up at this time.   # Carotid artery disease: Stable.  Lesions are not significant enough to be the source of her stroke/TIA.  Continue atorvastatin.  No aspirin on Xarelto.  Current medicines are reviewed at length with the patient today.  The patient does not have concerns regarding medicines.  The following changes have been made:  no change  Labs/ tests ordered today include:  No orders of the defined types were placed in this encounter.     Disposition:   FU with Nitika Jackowski C. Oval Linsey, MD in 1 year.   Signed, Sharol Harness, MD  05/25/2015 1:52 AM    [place]

## 2015-05-24 ENCOUNTER — Encounter: Payer: Self-pay | Admitting: Cardiovascular Disease

## 2015-05-24 ENCOUNTER — Ambulatory Visit (INDEPENDENT_AMBULATORY_CARE_PROVIDER_SITE_OTHER): Payer: Medicare Other | Admitting: Cardiovascular Disease

## 2015-05-24 VITALS — BP 134/76 | HR 75 | Ht 64.0 in | Wt 192.6 lb

## 2015-05-24 DIAGNOSIS — I480 Paroxysmal atrial fibrillation: Secondary | ICD-10-CM

## 2015-05-24 DIAGNOSIS — I340 Nonrheumatic mitral (valve) insufficiency: Secondary | ICD-10-CM

## 2015-05-24 NOTE — Patient Instructions (Signed)
Dr Oval Linsey recommends that you schedule a follow-up appointment in 1 year. You will receive a reminder letter in the mail two months in advance. If you don't receive a letter, please call our office to schedule the follow-up appointment.  If you need a refill on your cardiac medications before your next appointment, please call your pharmacy.

## 2015-05-25 ENCOUNTER — Encounter: Payer: Self-pay | Admitting: Cardiovascular Disease

## 2015-06-06 ENCOUNTER — Ambulatory Visit: Payer: Medicare Other | Admitting: Neurology

## 2015-06-26 ENCOUNTER — Other Ambulatory Visit: Payer: Self-pay | Admitting: Neurology

## 2015-06-26 ENCOUNTER — Other Ambulatory Visit: Payer: Self-pay

## 2015-06-26 MED ORDER — GABAPENTIN 100 MG PO CAPS: 100.0000 mg | ORAL_CAPSULE | Freq: Three times a day (TID) | ORAL | Status: DC

## 2015-06-26 NOTE — Telephone Encounter (Signed)
Last ov 02/13/15 Next OV: 11/08/15

## 2015-07-02 ENCOUNTER — Ambulatory Visit (INDEPENDENT_AMBULATORY_CARE_PROVIDER_SITE_OTHER): Payer: Medicare Other | Admitting: Neurology

## 2015-07-02 ENCOUNTER — Encounter: Payer: Self-pay | Admitting: Neurology

## 2015-07-02 DIAGNOSIS — M4806 Spinal stenosis, lumbar region: Secondary | ICD-10-CM

## 2015-07-02 DIAGNOSIS — M48062 Spinal stenosis, lumbar region with neurogenic claudication: Secondary | ICD-10-CM

## 2015-07-02 MED ORDER — PREGABALIN 75 MG PO CAPS: 75.0000 mg | ORAL_CAPSULE | Freq: Two times a day (BID) | ORAL | Status: DC

## 2015-07-02 NOTE — Progress Notes (Signed)
NEUROLOGY FOLLOW UP OFFICE NOTE  JILLION CARRIE RL:[id number]  HISTORY OF PRESENT ILLNESS: [name] is an 81 year old right-handed woman with history of hypertension, atrial fibrillation, peripheral vascular disease, anxiety, depression, B Cell lymphoma status post chemotherapy, COPD and seizure secondary to stroke who follows up for lumbar stenosis.  Prior lumbar MRI from 05/07/14 showed progressive disc degeneration and annular disc bulging at L4-5, contributing to spinal stenosis.  Both epidural injections and gabapentin have been ineffective.  She takes 100mg  at bedtime but higher doses causes drowsiness.  She has had worsening back pain with neurogenic claudication.  Gait had worsened.  She is unable to ambulate without a walker.  She reportedly had a repeat MRI of the lumbar spine which showed progression of stenosis and degeneration.  She saw a neurosurgeon, Dr. Ronnald Ramp, who would like to perform surgery.  However, it was recommended by her cardiologist not to discontinue anticoagulation so Dr. Ronnald Ramp has said surgery would now not be an option.  She is in significant pain and she cannot walk because of it.  PAST MEDICAL HISTORY: Past Medical History  Diagnosis Date  . GERD (gastroesophageal reflux disease)   . History of TIAs   . PUD (peptic ulcer disease)   . PVD (peripheral vascular disease) ([place])   . Hyperlipidemia   . Anxiety   . Depression   . Blood transfusion     "w/1st miscarriage" (05/26/2013)  . COPD (chronic obstructive pulmonary disease) ([place])   . Hypertension   . Family history of anesthesia complication     "sisters also get sick as a dog" (05/26/2013)  . Coronary artery disease   . Heart murmur     "when I was a child" (05/26/2013)  . DVT (deep venous thrombosis) ([place]) 1954    "after I had my first child" (05/26/2013)  . Pneumonia 2013    "once" (05/26/2013)  . Exertional shortness of breath   . H/O hiatal hernia   . Stroke ([place]) 05/26/2013    "very  mild; affected my speech for a while" (05/26/2013)  . Arthritis     "lots; hands" (05/26/2013)  . 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 Lymphona, mantle cell, inguinal region/lower limb ([place])     "large c-cell" (05/26/2013)  . VARICOSE VEINS LOWER EXTREMITIES W/INFLAMMATION 01/15/2009  . PEPTIC ULCER DISEASE 07/06/2008  . OSTEOARTHRITIS 05/03/2007  . HCAP (healthcare-associated pneumonia) 06/26/2013  . Diverticulitis of colon with hemorrhage 07/24/2008  . ABDOMINAL INCISIONAL HERNIA 04/03/2010       . GERD 05/03/2007         MEDICATIONS: Current Outpatient Prescriptions on File Prior to Visit  Medication Sig Dispense Refill  . acetaminophen (TYLENOL) 650 MG CR tablet Take 650 mg by mouth every 8 (eight) hours as needed for pain.    Marland Kitchen ALPRAZolam (XANAX) 0.25 MG tablet Take 1 tablet (0.25 mg total) by mouth once as needed for anxiety (before procedure). 1 tablet 0  . atorvastatin (LIPITOR) 20 MG tablet TAKE ONE TABLET BY MOUTH ONCE DAILY 30 tablet 6  . bisoprolol (ZEBETA) 5 MG tablet Take 1 tablet (5 mg total) by mouth daily. 30 tablet 5  . cholecalciferol (VITAMIN D) 1000 UNITS tablet Take 1,000 Units by mouth daily.    . diclofenac sodium (VOLTAREN) 1 % GEL Apply 2 g topically 4 (four) times daily. 1 Tube 1  . gabapentin (NEURONTIN) 100 MG capsule Take 1 capsule (100 mg total)  by mouth 3 (three) times daily. 90 capsule 4  . Glucosamine HCl (GLUCOSAMINE PO) Take 1 tablet by mouth daily.     Marland Kitchen levETIRAcetam (KEPPRA) 750 MG tablet TAKE ONE TABLET BY MOUTH TWICE DAILY 60 tablet 5  . loperamide (IMODIUM) 2 MG capsule Take 2 mg by mouth daily as needed for diarrhea or loose stools.     Alveda Reasons 15 MG TABS tablet TAKE ONE TABLET BY MOUTH ONCE DAILY WITH SUPPER 30 tablet 5   No current facility-administered medications on file prior to visit.    ALLERGIES: Allergies  Allergen Reactions  . Ativan [Lorazepam] Other (See Comments)     Severe hallucinations   . Propoxyphene Hcl   . Vancomycin   . Codeine Itching  . Penicillins Rash    FAMILY HISTORY: Family History  Problem Relation Age of Onset  . Colon cancer Sister     colon  . Hyperlipidemia Mother   . Stroke Mother   . Cancer Father     mesothelioma    SOCIAL HISTORY: Social History   Social History  . Marital Status: Married    Spouse Name: N/A  . Number of Children: 6  . Years of Education: N/A   Occupational History  . retired    Social History Main Topics  . Smoking status: Former Smoker -- 2.50 packs/day for 37 years    Types: Cigarettes    Quit date: 09/05/1984  . Smokeless tobacco: Never Used  . Alcohol Use: No  . Drug Use: No  . Sexual Activity: No   Other Topics Concern  . Not on file   Social History Narrative   Married (husband patient of Dr. Yong Channel). 6 children (lost 2 for total 8). 15 grandkids. 6 greatgrandchildren.    Lives with husband. Husband drives. Patient does not drive.       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     REVIEW OF SYSTEMS: Constitutional: No fevers, chills, or sweats, no generalized fatigue, change in appetite Eyes: No visual changes, double vision, eye pain Ear, nose and throat: No hearing loss, ear pain, nasal congestion, sore throat Cardiovascular: No chest pain, palpitations Respiratory:  No shortness of breath at rest or with exertion, wheezes GastrointestinaI: No nausea, vomiting, diarrhea, abdominal pain, fecal incontinence Genitourinary:  No dysuria, urinary retention or frequency Musculoskeletal:  As above Integumentary: No rash, pruritus, skin lesions Neurological: as above Psychiatric: No depression, insomnia, anxiety Endocrine: No palpitations, fatigue, diaphoresis, mood swings, change in appetite, change in weight, increased thirst Hematologic/Lymphatic:  No anemia, purpura, petechiae. Allergic/Immunologic: no itchy/runny  eyes, nasal congestion, recent allergic reactions, rashes  PHYSICAL EXAM: Filed Vitals:   07/02/15 1412  BP: 136/66  Pulse: 100   General: No acute distress.  Patient appears well-groomed.   Head:  Normocephalic/atraumatic  IMPRESSION: Lumbar stenosis with neurogenic claudication  PLAN: Of course, discontinuing Xarelto for surgery would increase risk for stroke.  The patient would have to consider risks and benefits.  From my standpoint, we should treat the pain.  We will try Lyrica 75mg  twice daily.  She will contact us with update.  Follow up as scheduled.  28 minutes spent face to face with patient, 100% spent discussing risks and benefits of discontinuing Xarelto, as well as conservative treatment for lumbar stenosis.  Metta Clines, DO  CC:  [name], MD

## 2015-07-02 NOTE — Patient Instructions (Signed)
1.  Start Lyrica 75mg  twice daily 2.  Follow up as scheduled but call with questions or concerns

## 2015-07-05 ENCOUNTER — Ambulatory Visit (INDEPENDENT_AMBULATORY_CARE_PROVIDER_SITE_OTHER): Payer: Medicare Other | Admitting: Family Medicine

## 2015-07-05 ENCOUNTER — Encounter: Payer: Self-pay | Admitting: Family Medicine

## 2015-07-05 VITALS — BP 108/60 | Temp 97.8°F | Wt 181.0 lb

## 2015-07-05 DIAGNOSIS — M4806 Spinal stenosis, lumbar region: Secondary | ICD-10-CM | POA: Diagnosis not present

## 2015-07-05 DIAGNOSIS — M48062 Spinal stenosis, lumbar region with neurogenic claudication: Secondary | ICD-10-CM

## 2015-07-05 MED ORDER — HYDROCODONE-ACETAMINOPHEN 5-325 MG PO TABS: 0.5000 | ORAL_TABLET | Freq: Four times a day (QID) | ORAL | Status: DC | PRN

## 2015-07-05 NOTE — Patient Instructions (Signed)
I am sorry you are in pain and the Lyrica has not helped a great deal yet. Hopeful that in coming days and weeks it helps more. Use Norco sparingly in the daytime to help you get up and get around hopefully.   I will be watching to see if any changes are made in your plan. i am sorry you are having such a difficult time with pain.

## 2015-07-05 NOTE — Assessment & Plan Note (Signed)
S: Patient continues to have severe pain from her spinal stenosis. She complains of severe pain that makes it difficult for her to get around. The fact she cannot move is tough on her. She would rather pass away due to surgical attempt to correct then to continue in current level of pain. She was recently started on Lyrica by neurology without significant improvement at this point (states can barely tell but makes her feel "drunk at times". She wants to see if it may help if she continues. Cardiology initially gave medical clearance for surgery but apparently after conversation other information was discovered which has changed this course.  A/P: We will attempt to help her with pain control with 1/2 tab of norco 5/325. She states she has has had difficulty with these at full dose in past so we will try 1/2 dose. She is not sure if she is going to take it but I suggested she have it on hand. If we can find a way to control her pain without surgery I think that would be ideal given her risks though I generally do not advise narcotics for patients.

## 2015-07-05 NOTE — Progress Notes (Signed)
Lisa Reddish, MD  Subjective:  [name] is a 81 y.o. year old very pleasant female patient who presents for/with See problem oriented charting ROS- some chills today, also mild runny nose. No chest pain or shortness of breath. Denies palpitations.   Past Medical History-  Patient Active Problem List   Diagnosis Date Noted  . History of cardioembolic stroke [id number]    Priority: High  . Spinal stenosis, lumbar region, with neurogenic claudication 05/09/2014    Priority: High  . Focal seizure ([place]) 06/16/2013    Priority: High  . TIA (transient ischemic attack) 05/31/2013    Priority: High  . Large B-cell lymphoma ([place]) 02/18/2012    Priority: High  . Atrial fibrillation ([place]) 12/14/2008    Priority: High  . Diverticulitis of colon 09/26/2014    Priority: Medium  . CKD (chronic kidney disease), stage III 09/26/2014    Priority: Medium  . Laryngeal carcinoma ([place])     Priority: Medium  . Anxiety state 10/20/2008    Priority: Medium  . Depression 10/20/2008    Priority: Medium  . COPD (chronic obstructive pulmonary disease) ([place]) 03/26/2008    Priority: Medium  . Hyperlipidemia 03/18/2007    Priority: Medium  . Essential hypertension 03/18/2007    Priority: Medium  . Weakness generalized 06/30/2013    Priority: Low  . Bilateral hand pain 01/09/2013    Priority: Low  . 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 ([place]) 02/13/2015    Medications- reviewed and updated Current Outpatient Prescriptions  Medication Sig Dispense Refill  . atorvastatin (LIPITOR) 20 MG tablet TAKE ONE TABLET BY MOUTH ONCE DAILY 30 tablet 6  . bisoprolol (ZEBETA) 5 MG tablet Take 1 tablet (5 mg total) by mouth daily. 30 tablet 5  . cholecalciferol (VITAMIN D) 1000 UNITS tablet Take 1,000 Units by mouth daily.    . diclofenac sodium (VOLTAREN) 1 % GEL Apply 2 g topically 4 (four) times daily. 1 Tube 1  . gabapentin (NEURONTIN) 100 MG capsule Take 1 capsule (100 mg  total) by mouth 3 (three) times daily. 90 capsule 4  . Glucosamine HCl (GLUCOSAMINE PO) Take 1 tablet by mouth daily.     Marland Kitchen levETIRAcetam (KEPPRA) 750 MG tablet TAKE ONE TABLET BY MOUTH TWICE DAILY 60 tablet 5  . loperamide (IMODIUM) 2 MG capsule Take 2 mg by mouth daily as needed for diarrhea or loose stools.     . pregabalin (LYRICA) 75 MG capsule Take 1 capsule (75 mg total) by mouth 2 (two) times daily. 60 capsule 1  . XARELTO 15 MG TABS tablet TAKE ONE TABLET BY MOUTH ONCE DAILY WITH SUPPER 30 tablet 5  . acetaminophen (TYLENOL) 650 MG CR tablet Take 650 mg by mouth every 8 (eight) hours as needed for pain.    Marland Kitchen ALPRAZolam (XANAX) 0.25 MG tablet Take 1 tablet (0.25 mg total) by mouth once as needed for anxiety (before procedure). (Patient not taking: Reported on 07/05/2015) 1 tablet 0  . HYDROcodone-acetaminophen (NORCO/VICODIN) 5-325 MG tablet Take 0.5 tablets by mouth every 6 (six) hours as needed. 30 tablet 0   No current facility-administered medications for this visit.    Objective: BP 108/60 mmHg  Temp(Src) 97.8 F (36.6 C)  Wt 181 lb (82.101 kg) Gen: NAD, resting comfortably Mild rhinorrhea clear CV: RRR no murmurs rubs or gallops (actually appears in sinus today) Lungs: CTAB no crackles, wheeze, rhonchi Abdomen: soft/nontender/nondistended/normal bowel sounds. No rebound or guarding.  Ext: no edema Skin: warm, dry Neuro:  grossly normal, moves all extremities  Assessment/Plan:  Spinal stenosis, lumbar region, with neurogenic claudication S: Patient continues to have severe pain from her spinal stenosis. She complains of severe pain that makes it difficult for her to get around. The fact she cannot move is tough on her. She would rather pass away due to surgical attempt to correct then to continue in current level of pain. She was recently started on Lyrica by neurology without significant improvement at this point (states can barely tell but makes her feel "drunk at times".  She wants to see if it may help if she continues. Cardiology initially gave medical clearance for surgery but apparently after conversation other information was discovered which has changed this course.  A/P: We will attempt to help her with pain control with 1/2 tab of norco 5/325. She states she has has had difficulty with these at full dose in past so we will try 1/2 dose. She is not sure if she is going to take it but I suggested she have it on hand. If we can find a way to control her pain without surgery I think that would be ideal given her risks though I generally do not advise narcotics for patients.    Return precautions advised.   Meds ordered this encounter  Medications  . HYDROcodone-acetaminophen (NORCO/VICODIN) 5-325 MG tablet    Sig: Take 0.5 tablets by mouth every 6 (six) hours as needed.    Dispense:  30 tablet    Refill:  0   >50% of 30 minute office visit was spent on counseling (options for pain control, discussing risks of narcotics and prior effects on patient, counseling in regards to stress of chronic pain, difficulties with complex medical history and risks/benefits of surgery) and coordination of care

## 2015-07-09 ENCOUNTER — Other Ambulatory Visit: Payer: Self-pay | Admitting: Family Medicine

## 2015-07-11 ENCOUNTER — Telehealth: Payer: Self-pay

## 2015-07-11 NOTE — Telephone Encounter (Signed)
She may just discontinue it.

## 2015-07-11 NOTE — Telephone Encounter (Signed)
Husband, [name], called [phone]) states wife is having side effects on the Lyrica. She has been "very sleepy, and lethargic". Would like to come off medication. Currently on 75 mg BID. Please advise.

## 2015-07-11 NOTE — Telephone Encounter (Signed)
Message relayed to husband. Verbalized understanding and denied questions.  Will d/c medication.

## 2015-07-18 ENCOUNTER — Ambulatory Visit (INDEPENDENT_AMBULATORY_CARE_PROVIDER_SITE_OTHER): Payer: Medicare Other | Admitting: Cardiovascular Disease

## 2015-07-18 ENCOUNTER — Telehealth: Payer: Self-pay | Admitting: Cardiovascular Disease

## 2015-07-18 VITALS — BP 136/84 | HR 78 | Ht 64.0 in | Wt 185.2 lb

## 2015-07-18 DIAGNOSIS — I10 Essential (primary) hypertension: Secondary | ICD-10-CM

## 2015-07-18 DIAGNOSIS — I480 Paroxysmal atrial fibrillation: Secondary | ICD-10-CM

## 2015-07-18 DIAGNOSIS — E785 Hyperlipidemia, unspecified: Secondary | ICD-10-CM | POA: Diagnosis not present

## 2015-07-18 NOTE — Telephone Encounter (Signed)
Pt was instructed to call in and give Dr. Oval Linsey Dr. Sherley Bounds' number. His number is [phone] and he works with [facility].   Thanks

## 2015-07-18 NOTE — Patient Instructions (Signed)
NO CHANGE IN CURRENT TREATMENT  Your physician wants you to follow-up in 12 month Dr Oval Linsey.  You will receive a reminder letter in the mail two months in advance. If you don't receive a letter, please call our office to schedule the follow-up appointment.  If you need a refill on your cardiac medications before your next appointment, please call your pharmacy.

## 2015-07-18 NOTE — Progress Notes (Signed)
Cardiology Office Note   Date:  07/21/2015   ID:  NECHELLE PERRINS, DOB 08/25/1927, MRN [id number]  PCP:  [name], MD  Cardiologist:   Sharol Harness, MD   Chief Complaint  Patient presents with  . Follow-up    AFIB//pt c/o swelling in bilateral legs, feet, ankles--wearing compression stockings    Patient ID: [name] is a 81 y.o. female with paroxysmal atrial fibrillation, mild AS, moderate MR, ischemic stroke with residual seizures and aphasia, hypertension, COPD, CKD 3, hyperlipidemia, and B Cell Lymphoma who presents for pre-operative risk assessment.  Interval History: [name] was evaluated on 10/20 for pre-surgical risk.  She was deemed to be low-risk, but bridging with heparin was reccommended pre-operattively while Xarelto is held.  Since that appointment she has been seen by her PCP, Dr. [name], who recommended that she treat her spinal stenosis pain with narcotics rather than undergo surgery, due to the risk of holding Xarelto.  She states that Dr. Ronnald Ramp is unwilling to operate on heparin.  She continues to have back and leg pain that inhibits her from walking and doing her ADLs.  She reports having no quality of life and is willing to risk recurrent stroke in order to have surgery.   History of Present Illness 05/23/15: [name] is preparing for spine surgery with Dr. Sherley Bounds.  She has been barely able to walk for 2-3 years due to back and leg pain.  He is willing to do the surgery if Xarelto can be held.  [name] was diagnosed with atrial fibrillation approximately 6 years ago.  She also has a history of recurrent cardioembolic stroke with expressive aphasia.   In November 2014 she had transient expressive aphasia and right-sided weakness and was found to have 2 punctate infarcts in bilateral hemispheres at the level of the centrum semiovale.  She was switched from Eliquis to Xarelto.  Five days later she was admitted with new  lesions on MRI in the same region.   [name] denies any exertional chest pain or shortness of breath. She has been placing a tile backsplash and reports some arm and chest soreness since starting this project a week ago.  The pain occurs when moving her arm.  She denies any chest pain or shortness of breath with exertion.  She is limited by her bilateral leg pain and is unable to stand or walk for prolonged periods of time. .   [name] is in remission from both her lymphoma and laryngeal cancer.  She notes mild LE edema that improves with wearing compression stockings.  She very occasionally uses a diuretic.  She denies orthopnea or PND.  [name] is originally from [place] and retired in [place].  She later moved to [place] because her three daughters live here.   Past Medical History  Diagnosis Date  . GERD (gastroesophageal reflux disease)   . History of TIAs   . PUD (peptic ulcer disease)   . PVD (peripheral vascular disease) ([place])   . Hyperlipidemia   . Anxiety   . Depression   . Blood transfusion     "w/1st miscarriage" (05/26/2013)  . COPD (chronic obstructive pulmonary disease) ([place])   . Hypertension   . Family history of anesthesia complication     "sisters also get sick as a dog" (05/26/2013)  . Coronary artery disease   . Heart murmur     "when I was a child" (05/26/2013)  .  DVT (deep venous thrombosis) ([place]) 1954    "after I had my first child" (05/26/2013)  . Pneumonia 2013    "once" (05/26/2013)  . Exertional shortness of breath   . H/O hiatal hernia   . Stroke ([place]) 05/26/2013    "very mild; affected my speech for a while" (05/26/2013)  . Arthritis     "lots; hands" (05/26/2013)  . Laryngeal carcinoma ([place]) hx of ca    remotely with resection and xrt/chronic hoarsemenss  . Lymphona, mantle cell, inguinal region/lower limb ([place])     "large c-cell" (05/26/2013)  . VARICOSE VEINS LOWER EXTREMITIES W/INFLAMMATION 01/15/2009  . PEPTIC ULCER DISEASE  07/06/2008  . OSTEOARTHRITIS 05/03/2007  . HCAP (healthcare-associated pneumonia) 06/26/2013  . Diverticulitis of colon with hemorrhage 07/24/2008  . ABDOMINAL INCISIONAL HERNIA 04/03/2010       . GERD 05/03/2007         Past Surgical History  Procedure Laterality Date  . 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 Tubal ligation  1972  . Radical neck dissection  1986    "larynx cancer" (05/26/2013)  . Total knee arthroplasty Bilateral     2002 left 2008 right  . Ileostomy closure  01/2010  . Cataract extraction  2005    bilateral  . Carotid endarterectomy Bilateral 2004    2004 a month apart right and left  . Tonsillectomy and adenoidectomy  1936    "tonsils grew back; no 2nd OR" (05/26/2013)  . Hernia repair  05/2010    ventral hernia repair  . Carpal tunnel release Left ?1960's  . Abdominal exploration surgery  12/01/2012  . Appendectomy  09/13/2008  . Colon surgery  09/13/2008    Laparoscopic assisted converted to open sigmoid colectomy.Archie Endo 09/13/2008 (05/26/2013)  . Dilation and curettage of uterus  1950's    "had 2 for miscarriages" (05/26/2013)     Current Outpatient Prescriptions  Medication Sig Dispense Refill  . acetaminophen (TYLENOL) 650 MG CR tablet Take 650 mg by mouth every 8 (eight) hours as needed for pain.    Marland Kitchen ALPRAZolam (XANAX) 0.25 MG tablet Take 1 tablet (0.25 mg total) by mouth once as needed for anxiety (before procedure). 1 tablet 0  . atorvastatin (LIPITOR) 20 MG tablet TAKE ONE TABLET BY MOUTH ONCE DAILY 30 tablet 6  . bisoprolol (ZEBETA) 5 MG tablet TAKE ONE TABLET BY MOUTH ONCE DAILY 30 tablet 5  . cholecalciferol (VITAMIN D) 1000 UNITS tablet Take 1,000 Units by mouth daily.    . diclofenac sodium (VOLTAREN) 1 % GEL Apply 2 g topically 4 (four) times daily. 1 Tube 1  . gabapentin (NEURONTIN) 100 MG capsule Take 1 capsule (100 mg total) by mouth 3 (three) times daily. 90 capsule 4  . Glucosamine HCl  (GLUCOSAMINE PO) Take 1 tablet by mouth daily.     Marland Kitchen HYDROcodone-acetaminophen (NORCO/VICODIN) 5-325 MG tablet Take 0.5 tablets by mouth every 6 (six) hours as needed. 30 tablet 0  . levETIRAcetam (KEPPRA) 750 MG tablet TAKE ONE TABLET BY MOUTH TWICE DAILY 60 tablet 5  . loperamide (IMODIUM) 2 MG capsule Take 2 mg by mouth daily as needed for diarrhea or loose stools.     . pregabalin (LYRICA) 75 MG capsule Take 1 capsule (75 mg total) by mouth 2 (two) times daily. 60 capsule 1  . XARELTO 15 MG TABS tablet TAKE ONE TABLET BY MOUTH ONCE DAILY WITH SUPPER 30 tablet 5   No current facility-administered medications for this  visit.    Allergies:   Ativan; Propoxyphene hcl; Vancomycin; Codeine; and Penicillins    Social History:  The patient  reports that she quit smoking about 30 years ago. Her smoking use included Cigarettes. She has a 92.5 pack-year smoking history. She has never used smokeless tobacco. She reports that she does not drink alcohol or use illicit drugs.   Family History:  The patient's family history includes Cancer in her father; Colon cancer in her sister; Hyperlipidemia in her mother; Stroke in her mother.    ROS:  Please see the history of present illness.   Otherwise, review of systems are positive for none.   All other systems are reviewed and negative.    PHYSICAL EXAM: VS:  BP 136/84 mmHg  Pulse 78  Ht 5\' 4"  (1.626 m)  Wt 84.006 kg (185 lb 3.2 oz)  BMI 31.77 kg/m2 , BMI Body mass index is 31.77 kg/(m^2). GENERAL:  Well appearing HEENT:  Pupils equal round and reactive, fundi not visualized, oral mucosa unremarkable NECK:  No jugular venous distention, waveform within normal limits, carotid upstroke brisk and symmetric, no bruits, no thyromegaly LYMPHATICS:  No cervical adenopathy LUNGS:  Clear to auscultation bilaterally HEART:  RRR.  PMI not displaced or sustained,S1 and S2 within normal limits, no S3, no S4, no clicks, no rubs, II/VI holosystolic murmur ABD:   Flat, positive bowel sounds normal in frequency in pitch, no bruits, no rebound, no guarding, no midline pulsatile mass, no hepatomegaly, no splenomegaly EXT:  2 plus pulses throughout, trace edema, no cyanosis no clubbing SKIN:  No rashes no nodules NEURO:  Cranial nerves II through XII grossly intact, motor grossly intact throughout PSYCH:  Cognitively intact, oriented to person place and time   EKG:  EKG is not ordered today.  Carotid doppler 08/10/14:  1-39% stenosis in bilateral internal carotid arteries.  Echo 08/10/14: Study Conclusions  - Left ventricle: The cavity size was mildly dilated. There was mild focal basal hypertrophy of the septum. Systolic function was normal. The estimated ejection fraction was in the range of 55% to 60%. - Aortic valve: There was mild stenosis. - Mitral valve: There was moderate regurgitation. - Left atrium: The atrium was moderately dilated. - Right atrium: The atrium was mildly dilated. - Atrial septum: No defect or patent foramen ovale was identified.  Recent Labs: 08/05/2014: ALT 17; BUN 15; Creatinine, Ser 1.07; Hemoglobin 12.7; Platelets 186; Potassium 4.6; Sodium 139    Lipid Panel    Component Value Date/Time   CHOL 170 08/07/2014 0744   TRIG 235* 08/07/2014 0744   HDL 33* 08/07/2014 0744   CHOLHDL 5.2 08/07/2014 0744   VLDL 47* 08/07/2014 0744   LDLCALC 90 08/07/2014 0744   LDLDIRECT 100.9 02/13/2010 0838      Wt Readings from Last 3 Encounters:  07/18/15 84.006 kg (185 lb 3.2 oz)  07/05/15 82.101 kg (181 lb)  07/02/15 87.998 kg (194 lb)      ASSESSMENT AND PLAN:  # Pre-surgical risk assessment: [name] is here again to discuss her surgical risk.  She wants to have spinal surgery due to debilitating back pain.  She is failing medical management. On the surface, this patient's CHA2DS2-VASc Score and unadjusted Ischemic Stroke Rate (% per year) is equal to 7.2 % stroke rate/year from a score of 5. (score calculated as  1 point each if present [CHF, HTN, DM, Vascular=MI/PAD/Aortic Plaque, Age if 55-74, or Female and 2 points each if present [Age > 75, or  Stroke/TIA/TE].  This indicated that her daily risk of stroke while holding anticoagulation is 0.02% or a 5 day risk of 0.1%. However, this likely underestimates her risk, as she has has had recurrent events on anticoagulation.  We discussed the fact that her risk is likely higher than that.  She reports that her quality of life is so poor that she is willing to accept that risk.  Unfortunately there are not any randomized studies or guidelines for these situations.  Assuming that her risk is 10-fold higher than average, this would still be 1%.  Therefore, I think it is reasonable for her to undergo surgery, understanding that she is at a higher risk than average.  Ideally, Xarelto would be held for 48 hours (>5 half lives) and she would be bridged with heparin.  Heparin can be discontinued 12 hours prior to surgery.   Xarelto can be held up to 5 days (if necessary) while bridging with heparin. Her pre-operative surgical risk of MI or SCD is 0.97%, which is low risk.  Therefore, she does not require any additional testing at this time.  # Hypertension: BP well-controlled.  Continue bisoprolol.  # Hyperlipidemia: Continue atorvastatin.  Current medicines are reviewed at length with the patient today.  The patient does not have concerns regarding medicines.  The following changes have been made:  no change  Labs/ tests ordered today include:  No orders of the defined types were placed in this encounter.     Disposition:   FU with Kemper Heupel C. Oval Linsey, MD in 1 year.   Signed, Sharol Harness, MD  07/21/2015 10:44 AM    [place]

## 2015-07-19 ENCOUNTER — Telehealth: Payer: Self-pay | Admitting: Cardiovascular Disease

## 2015-07-19 NOTE — Telephone Encounter (Signed)
Previous note regarding Dr. Ronnald Ramp' contact info was forwarded to Dr. Oval Linsey. I informed pt that Dr. Oval Linsey is not in, likely this would be followed up on next week.  She expressed thanks for the call, no further questions/concerns at this time.

## 2015-07-19 NOTE — Telephone Encounter (Signed)
Mr. Breuninger is calling to find out if Dr. Oval Linsey spoke with Dr. Ronnald Ramp on yesterday , please call   Thanks

## 2015-07-19 NOTE — Telephone Encounter (Signed)
Pt's husband called back in to speak with Dr. Blenda Mounts nurse. Please f/u with him  Thanks

## 2015-07-21 ENCOUNTER — Encounter: Payer: Self-pay | Admitting: Cardiovascular Disease

## 2015-07-22 NOTE — Telephone Encounter (Signed)
[name] is calling to regarding [name] to see if Dr. Oval Linsey have spoken w/ the Dr. Ronnald Ramp at [facility] .... The number is [phone]..  Thanks

## 2015-07-23 NOTE — Telephone Encounter (Signed)
°  Follow Up   Pt is calling following up on mess. below regarding a call made to Dr. Ronnald Ramp at [facility]. Please call.

## 2015-07-23 NOTE — Telephone Encounter (Signed)
Returned call to patient.Spoke to husband.He stated he was calling to find out if Dr.Heath has spoke to Dr.Jones at [facility].Message sent to [place].

## 2015-07-24 NOTE — Telephone Encounter (Signed)
I left a message for Dr. Ronnald Ramp but have not heard back from him.

## 2015-07-25 NOTE — Telephone Encounter (Signed)
Spoke with patient Informed patient of previous note Patient aware will resend last office note Routed 07/18/15 office visit

## 2015-08-06 ENCOUNTER — Telehealth: Payer: Self-pay | Admitting: Cardiovascular Disease

## 2015-08-06 NOTE — Telephone Encounter (Addendum)
Spoke with pt husband, aware per dr Oval Linsey we have faxed the last office note over to them with the recommendations outlined in the note. Dr Oval Linsey also called and left a message for the surgeon but we have not heard back from them.  Advised him to call the spinal surgeon's office.

## 2015-08-06 NOTE — Telephone Encounter (Signed)
Pt's husband called in wanting to know what Dr. Blenda Mounts recommendation is as far as is she is cleared for have spinal surgery and how long she can hold her blood thinner. Please call  Thanks

## 2015-08-28 ENCOUNTER — Ambulatory Visit (INDEPENDENT_AMBULATORY_CARE_PROVIDER_SITE_OTHER): Payer: Medicare Other | Admitting: Family Medicine

## 2015-08-28 ENCOUNTER — Encounter: Payer: Self-pay | Admitting: Family Medicine

## 2015-08-28 VITALS — BP 112/50 | HR 76 | Temp 98.6°F | Wt 194.0 lb

## 2015-08-28 DIAGNOSIS — I480 Paroxysmal atrial fibrillation: Secondary | ICD-10-CM

## 2015-08-28 DIAGNOSIS — M4806 Spinal stenosis, lumbar region: Secondary | ICD-10-CM | POA: Diagnosis not present

## 2015-08-28 DIAGNOSIS — M48062 Spinal stenosis, lumbar region with neurogenic claudication: Secondary | ICD-10-CM

## 2015-08-28 NOTE — Assessment & Plan Note (Signed)
S: Patient has had difficulty getting medically cleared for surgery in such a way that has been satisfactory to her neurosurgeon. She has at this point given up on pursuing surgery. She had stopped going to PT last year while hoping for surgery but did find it helpful. She would like to return. She continues to have severe pain at times. Better with resting A/P: we will [facility] at [facility]- she had good success with this program. Extensive counseling and consoling of patient/husband as they choose to not pursue surgery. Patient now states 1% risk of stroke is too high for her to be willing to move forward.

## 2015-08-28 NOTE — Progress Notes (Signed)
[name], MD  Subjective:  [name] is a 82 y.o. year old very pleasant female patient who presents for/with See problem oriented charting ROS- denies palpitations or chest pain, chronic back pain noted  Past Medical History-  Patient Active Problem List   Diagnosis Date Noted  . History of cardioembolic stroke [id number]    Priority: High  . Spinal stenosis, lumbar region, with neurogenic claudication 05/09/2014    Priority: High  . Focal seizure ([place]) 06/16/2013    Priority: High  . TIA (transient ischemic attack) 05/31/2013    Priority: High  . Large B-cell lymphoma ([place]) 02/18/2012    Priority: High  . Atrial fibrillation ([place]) 12/14/2008    Priority: High  . Diverticulitis of colon 09/26/2014    Priority: Medium  . CKD (chronic kidney disease), stage III 09/26/2014    Priority: Medium  . Laryngeal carcinoma ([place])     Priority: Medium  . Anxiety state 10/20/2008    Priority: Medium  . Depression 10/20/2008    Priority: Medium  . COPD (chronic obstructive pulmonary disease) ([place]) 03/26/2008    Priority: Medium  . Hyperlipidemia 03/18/2007    Priority: Medium  . Essential hypertension 03/18/2007    Priority: Medium  . Weakness generalized 06/30/2013    Priority: Low  . Bilateral hand pain 01/09/2013    Priority: Low  . 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 ([place]) 02/13/2015    Medications- reviewed and updated Current Outpatient Prescriptions  Medication Sig Dispense Refill  . atorvastatin (LIPITOR) 20 MG tablet TAKE ONE TABLET BY MOUTH ONCE DAILY 30 tablet 6  . bisoprolol (ZEBETA) 5 MG tablet TAKE ONE TABLET BY MOUTH ONCE DAILY 30 tablet 5  . cholecalciferol (VITAMIN D) 1000 UNITS tablet Take 1,000 Units by mouth daily.    . diclofenac sodium (VOLTAREN) 1 % GEL Apply 2 g topically 4 (four) times daily. 1 Tube 1  . gabapentin (NEURONTIN) 100 MG capsule Take 1 capsule (100 mg total) by mouth 3 (three) times daily. 90  capsule 4  . Glucosamine HCl (GLUCOSAMINE PO) Take 1 tablet by mouth daily.     Marland Kitchen levETIRAcetam (KEPPRA) 750 MG tablet TAKE ONE TABLET BY MOUTH TWICE DAILY 60 tablet 5  . loperamide (IMODIUM) 2 MG capsule Take 2 mg by mouth daily as needed for diarrhea or loose stools.     Alveda Reasons 15 MG TABS tablet TAKE ONE TABLET BY MOUTH ONCE DAILY WITH SUPPER 30 tablet 5  . acetaminophen (TYLENOL) 650 MG CR tablet Take 650 mg by mouth every 8 (eight) hours as needed for pain. Reported on 08/28/2015    . ALPRAZolam (XANAX) 0.25 MG tablet Take 1 tablet (0.25 mg total) by mouth once as needed for anxiety (before procedure). (Patient not taking: Reported on 08/28/2015) 1 tablet 0   No current facility-administered medications for this visit.    Objective: BP 112/50 mmHg  Pulse 76  Temp(Src) 98.6 F (37 C)  Wt 194 lb (87.998 kg) Gen: NAD, resting comfortably CV: RRR no murmurs rubs or gallops Lungs: CTAB no crackles, wheeze, rhonchi Abdomen: soft/nontender/nondistended/normal bowel sounds. No rebound or guarding.  Skin: warm, dry Neuro: antalgic gait uses walker  Assessment/Plan:  Spinal stenosis, lumbar region, with neurogenic claudication S: Patient has had difficulty getting medically cleared for surgery in such a way that has been satisfactory to her neurosurgeon. She has at this point given up on pursuing surgery. She had stopped going to PT last year while hoping for surgery but  did find it helpful. She would like to return. She continues to have severe pain at times. Better with resting A/P: we will [facility]- she had good success with this program. Extensive counseling and consoling of patient/husband as they choose to not pursue surgery. Patient now states 1% risk of stroke is too high for her to be willing to move forward.    Atrial fibrillation S: patient appears in sinus today. Rate controlled whenin a fib on bisoprolol. Her big question is cost of xarelto  and wondering if she can go back to coumadin A/P: I know patient has had potential TIAs despite multiple anticoagulants but honestly it is unclear whether these were in fact TIAs. I will reach out to Dr. Tomi Likens to see if we can have her back on coumadin. Would obviously plan on bridging with lovenox   Orders Placed This Encounter  Procedures  . Ambulatory referral to Physical Therapy    Referral Priority:  Routine    Referral Type:  Physical Medicine    Referral Reason:  Specialty Services Required    Requested Specialty:  Physical Therapy    Number of Visits Requested:  1    >50% of 25 minute office visit was spent on counseling (in regards to dealing with not having surgery, managing pain, deciding whether to go back to coumadin, cost concerns) and coordination of care

## 2015-08-28 NOTE — Patient Instructions (Signed)
We will call you within a week about your referral to [facility] at [facility]. If you do not hear within 2 weeks, give [place] a call.   I will reach out to Dr. Tomi Likens and see if we can switch you back to coumadin. If you don't hear within 2 weeks- give [place] a call or send mychart message

## 2015-08-28 NOTE — Assessment & Plan Note (Signed)
S: patient appears in sinus today. Rate controlled whenin a fib on bisoprolol. Her big question is cost of xarelto and wondering if she can go back to coumadin A/P: I know patient has had potential TIAs despite multiple anticoagulants but honestly it is unclear whether these were in fact TIAs. I will reach out to Dr. Tomi Likens to see if we can have her back on coumadin. Would obviously plan on bridging with lovenox

## 2015-09-02 ENCOUNTER — Telehealth: Payer: Self-pay | Admitting: General Practice

## 2015-09-02 ENCOUNTER — Other Ambulatory Visit: Payer: Self-pay | Admitting: Family Medicine

## 2015-09-02 ENCOUNTER — Other Ambulatory Visit: Payer: Self-pay | Admitting: General Practice

## 2015-09-02 ENCOUNTER — Telehealth: Payer: Self-pay | Admitting: Family Medicine

## 2015-09-02 MED ORDER — WARFARIN SODIUM 5 MG PO TABS: 5.0000 mg | ORAL_TABLET | Freq: Every day | ORAL | Status: DC

## 2015-09-02 NOTE — Telephone Encounter (Signed)
Patient is transitioning to warfarin from Xarelto with Dr. Ansel Bong approval.   Patient was given instructions to take Xarelto and 5 mg of coumadin on Sat, Sun and Monday (2-4, 2-5 and 2-6) and then to stop Xarelto and continue 5 mg of warfarin.  Patient will have INR checked on Wednesday, 2-8 at [facility] coumadin clinic. Spoke with Jinny Blossom, pharmacist at [facility] to verify transition instructions.  Patient verbalized instructions.

## 2015-09-02 NOTE — Telephone Encounter (Signed)
LMOM for patient to call Villa Herb, RN @ [phone] - Ext - [phone].

## 2015-09-02 NOTE — Telephone Encounter (Signed)
-----   Message from Pieter Partridge, DO sent at 08/28/2015  2:36 PM EST ----- From a secondary stroke prevention standpoint, I have no objections. Adam ----- Message -----    From: Marin Olp, MD    Sent: 08/28/2015   2:10 PM      To: Pieter Partridge, DO  Dr. Tomi Likens- patient wants to go back to coumadin due to cost. Saw a brief mention of this in your last note. Do you see any contraindication to doing this? We would plan on bridging with lovenox while getting her therapeutic.  Thanks for your consideration, [name]- PCP

## 2015-09-02 NOTE — Telephone Encounter (Signed)
Will bridge to coumadin using our pharmacy protocol. Villa Herb, will call to instruct patient. First INR next Wednesday. Xarelto and coumadin sat, sun, Monday. Tuesday coumadin alone, Wednesday check INR. We confirmed this plan with our pharmacist.

## 2015-09-04 ENCOUNTER — Other Ambulatory Visit: Payer: Self-pay

## 2015-09-04 MED ORDER — ALPRAZOLAM 0.25 MG PO TABS: 0.2500 mg | ORAL_TABLET | Freq: Once | ORAL | Status: DC | PRN

## 2015-09-06 ENCOUNTER — Telehealth: Payer: Self-pay | Admitting: Family Medicine

## 2015-09-06 NOTE — Telephone Encounter (Signed)
Returning call to patient.  Patient states that she is not going to transition to coumadin.  She is going to keep taking Xarelto.

## 2015-09-06 NOTE — Telephone Encounter (Signed)
Pt request to talk with cindy boyd. Pt cancelled her 09-11-15 appt and did not reschedule

## 2015-09-11 ENCOUNTER — Ambulatory Visit: Payer: Medicare Other

## 2015-09-28 ENCOUNTER — Other Ambulatory Visit: Payer: Self-pay | Admitting: Family Medicine

## 2015-10-18 ENCOUNTER — Other Ambulatory Visit: Payer: Self-pay | Admitting: Family Medicine

## 2015-10-18 NOTE — Telephone Encounter (Signed)
Yes thanks, she had wanted to switch to coumadin and we ordered it then she changed her mind again- back on xarelto- may refill #90 with 3 refills if she is ok with 90 days

## 2015-10-28 ENCOUNTER — Encounter: Payer: Self-pay | Admitting: Family Medicine

## 2015-10-28 ENCOUNTER — Ambulatory Visit (INDEPENDENT_AMBULATORY_CARE_PROVIDER_SITE_OTHER): Payer: Medicare Other | Admitting: Family Medicine

## 2015-10-28 VITALS — BP 140/72 | HR 88 | Temp 98.8°F | Wt 195.0 lb

## 2015-10-28 DIAGNOSIS — J069 Acute upper respiratory infection, unspecified: Secondary | ICD-10-CM

## 2015-10-28 NOTE — Progress Notes (Signed)
PCP: [name], MD  Subjective:  [name] is a 82 y.o. year old very pleasant female patient who presents with Upper Respiratory infection symptoms including nasal congestion, coughing up green sputum which is now clearing. She wanted to make sure no pneumonia. When she coughed she at times would feel some left upper back pain- that is now improving and almost resolved -started: about a week ago and symptoms  are improving -previous treatments: rest, hydration -sick contacts/travel/risks: denies flu exposure.  -Hx of: allergies  ROS-denies fever, SOB more than baseline, NVD, tooth pain  Pertinent Past Medical History-  Patient Active Problem List   Diagnosis Date Noted  . History of cardioembolic stroke [id number]    Priority: High  . Spinal stenosis, lumbar region, with neurogenic claudication 05/09/2014    Priority: High  . Focal seizure ([place]) 06/16/2013    Priority: High  . TIA (transient ischemic attack) 05/31/2013    Priority: High  . Large B-cell lymphoma ([place]) 02/18/2012    Priority: High  . Atrial fibrillation ([place]) 12/14/2008    Priority: High  . Diverticulitis of colon 09/26/2014    Priority: Medium  . CKD (chronic kidney disease), stage III 09/26/2014    Priority: Medium  . Laryngeal carcinoma ([place])     Priority: Medium  . Anxiety state 10/20/2008    Priority: Medium  . Depression 10/20/2008    Priority: Medium  . COPD (chronic obstructive pulmonary disease) ([place]) 03/26/2008    Priority: Medium  . Hyperlipidemia 03/18/2007    Priority: Medium  . Essential hypertension 03/18/2007    Priority: Medium  . Weakness generalized 06/30/2013    Priority: Low  . Bilateral hand pain 01/09/2013    Priority: Low  . 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 ([place]) 02/13/2015    Medications- reviewed  Current Outpatient Prescriptions  Medication Sig Dispense Refill  . ALPRAZolam (XANAX) 0.25 MG tablet Take 1 tablet (0.25 mg total)  by mouth once as needed for anxiety (before procedure). 15 tablet 0  . atorvastatin (LIPITOR) 20 MG tablet TAKE ONE TABLET BY MOUTH ONCE DAILY 30 tablet 5  . bisoprolol (ZEBETA) 5 MG tablet TAKE ONE TABLET BY MOUTH ONCE DAILY 30 tablet 5  . cholecalciferol (VITAMIN D) 1000 UNITS tablet Take 1,000 Units by mouth daily.    . diclofenac sodium (VOLTAREN) 1 % GEL Apply 2 g topically 4 (four) times daily. 1 Tube 1  . gabapentin (NEURONTIN) 100 MG capsule Take 1 capsule (100 mg total) by mouth 3 (three) times daily. 90 capsule 4  . Glucosamine HCl (GLUCOSAMINE PO) Take 1 tablet by mouth daily.     Marland Kitchen levETIRAcetam (KEPPRA) 750 MG tablet TAKE ONE TABLET BY MOUTH TWICE DAILY 60 tablet 5  . loperamide (IMODIUM) 2 MG capsule Take 2 mg by mouth daily as needed for diarrhea or loose stools.     Alveda Reasons 15 MG TABS tablet TAKE ONE TABLET BY MOUTH ONCE DAILY WITH  SUPPER 90 tablet 3  . acetaminophen (TYLENOL) 650 MG CR tablet Take 650 mg by mouth every 8 (eight) hours as needed for pain. Reported on 10/28/2015     No current facility-administered medications for this visit.    Objective: BP 140/72 mmHg  Pulse 88  Temp(Src) 98.8 F (37.1 C)  Wt 195 lb (88.451 kg)  SpO2 90% Gen: NAD, resting comfortably HEENT: Turbinates erythematous, TM normal, pharynx mildly erythematous with no tonsilar exudate or edema, no sinus tenderness CV: RRR no murmurs rubs or gallops  Lungs: CTAB no crackles, wheeze, rhonchi Skin: warm, dry, no rash Neuro: grossly normal, moves all extremities  Assessment/Plan:  Upper Respiratory infection History and exam today are suggestive of viral URI.  We discussed that a serious infection or illness is unlikely. She does have a history of COPD but sputum is now clearing and reducing in volume. She was concerned for pneumonia but improving, no fever, and no findings on exam so doubt this. We also discussed reasons why current illness does not meet criteria for bacterial illness and  therefore no antibiotics indicated. Also educated on signs that bacterial infection may have developed.   Symptomatic treatment with: continued rest and hydration  Finally, we reviewed reasons to return to care including if symptoms worsen or persist or new concerns arise.  The duration of face-to-face time during this visit was 15 minutes. Greater than 50% of this time was spent in counseling, explanation of diagnosis, planning of further management, and/or coordination of care.

## 2015-10-28 NOTE — Patient Instructions (Signed)
Sounds like you had an upper respiratory infection which is improving  If you have fever, shortness of breath, pain increases again, then please let us know- and let us reevaluate you

## 2015-11-07 ENCOUNTER — Encounter: Payer: Self-pay | Admitting: Neurology

## 2015-11-07 ENCOUNTER — Ambulatory Visit (INDEPENDENT_AMBULATORY_CARE_PROVIDER_SITE_OTHER): Payer: Medicare Other | Admitting: Neurology

## 2015-11-07 VITALS — BP 140/72 | Ht 66.0 in | Wt 196.0 lb

## 2015-11-07 DIAGNOSIS — I639 Cerebral infarction, unspecified: Secondary | ICD-10-CM

## 2015-11-07 DIAGNOSIS — M48062 Spinal stenosis, lumbar region with neurogenic claudication: Secondary | ICD-10-CM

## 2015-11-07 DIAGNOSIS — M4806 Spinal stenosis, lumbar region: Secondary | ICD-10-CM | POA: Diagnosis not present

## 2015-11-07 DIAGNOSIS — G40109 Localization-related (focal) (partial) symptomatic epilepsy and epileptic syndromes with simple partial seizures, not intractable, without status epilepticus: Secondary | ICD-10-CM | POA: Diagnosis not present

## 2015-11-07 NOTE — Patient Instructions (Signed)
1.  Start Lyrica 75mg  twice daily for nerve pain in the back and legs 2.  Continue levetiracetam 750mg  twice daily 3.  Continue Xarelto 4.  Follow up in 6 months.

## 2015-11-07 NOTE — Progress Notes (Signed)
NEUROLOGY FOLLOW UP OFFICE NOTE  [name] [id number]  HISTORY OF PRESENT ILLNESS: [name] is an 82 year old right-handed woman with history of hypertension, atrial fibrillation, peripheral vascular disease, anxiety, depression, B Cell lymphoma status post chemotherapy, and COPD who follows up for recurrent aphasia, cardio-embolic stroke and symptomatic seizure secondary to stroke, and lumbar stenosis.  She is accompanied by her husband who provides history.  UPDATE: Recurrent episodes of aphasia.  She is taking Keppra 750mg  twice daily to address possibility that the recurrent episodes of aphasia are seizures.  She has not had any recurrent spells.    Lumbar Stenosis:  She takes gabapentin at night.  It causes drowsiness during the day.  We started Lyrica 75mg  twice daily in November but she never started it.  She decided against surgery given the risk for stroke if Xarelto is discontinued.    HISTORY: Since childhood, she has episodes of numbness in either of both hands and inability to get words out.  It lasts for about 2 minutes.  It should be known that she has had these episodes since childhood.  Sometimes it was associated with headache.  She had them about twice a year until young adulthood, when they resolved.  Last fall, she had several severe episodes and was started on Keppra at that time, for possible seizures.   She has had several episodes of confusion and aphasia in October and November of 2014, associated with stroke and presumed seizure.  On 05/26/13, MRI of the brain was performed, which revealed an acute lacunar infarct in the left anterior corona radiata and lentiform nucleus.  CTA of the head revealed atherosclerotic changes in the cavernous carotid arteries without significant stenosis.  CTA of neck revealed focal 50% stenosis of mid right ICA.  She was switched from [name] to Eliquis.  She was readmitted to the hospital on 05/31/13 with recurrent aphasia.   She had trouble getting her words out and then couldn't speak at all.  Repeat MRI of brain revealed new punctate infarct in the left posterior corona radiata, the same location as previous infarct. No changes in management were made.  She was readmitted to the hospital on 06/07/13 for altered mental status.  EEG was performed, showing left fronto-parieto-temporal theta slowing.  Due to possibility of seizure, she was started on Keppra 250mg  BID.  No change made to Eliquis.  She presented to the ED on 06/16/13 after waking up in the morning with expressive aphasia and right-sided weakness.  Symptoms improved in the ED.  CT head revealed prior lacunar infarct in left lentiform nucleus and corona radiata, but no new abnormalities.  It was suspected that she may have had another seizure and her Keppra was increased to 500mg  BID.  She was admitted to the hospital again on 06/21/13 again for expressive aphasia.  MRI brain revealed 2 new punctate infarcts in the bilateral hemispheres at level of centrum semiovale.  Eliquis was changed to Xarelto. Keppra was increased to 750mg  BID.    She presented to the ED on 08/05/14 for recurrent episode of aphasia, lasting 1.5 hours.  This was more significant than last year.  She described it as knowing what she wanted to say but having trouble getting the words out.  She had trouble writing as well.  MRI of brain revealed no acute infarct.  She had an episode two days prior, lasting 1 hour.  Carotid doppler revealed no hemodynamically significant ICA stenosis.  Lipid panel showed cholesterol  of 170, triglycerides of 235, HDL 33 and LDL 90.  Lumbar stenosis:  Both epidural injections and gabapentin have been ineffective.  She has had worsening back pain with neurogenic claudication.  She is unable to ambulate without a walker.  She reportedly had a repeat MRI of the lumbar spine which showed progression of stenosis and degeneration.  She saw a neurosurgeon, Dr. Ronnald Ramp, who would like  to perform surgery.  I did inform them that discontinuing Xarelto for surgery would increase risk for stroke.    Retinal migraines:  Of note, she has longstanding history of migraines, described as scotomas, fortification spectrum and flashing lights in the right eye.  There is no associated headache.  She takes Keppra 750mg  twice daily, Xarelto, Zebeta, Lipitor  PAST MEDICAL HISTORY: Past Medical History  Diagnosis Date  . GERD (gastroesophageal reflux disease)   . History of TIAs   . PUD (peptic ulcer disease)   . PVD (peripheral vascular disease) ([place])   . Hyperlipidemia   . Anxiety   . Depression   . Blood transfusion     "w/1st miscarriage" (05/26/2013)  . COPD (chronic obstructive pulmonary disease) ([place])   . Hypertension   . Family history of anesthesia complication     "sisters also get sick as a dog" (05/26/2013)  . Coronary artery disease   . Heart murmur     "when I was a child" (05/26/2013)  . DVT (deep venous thrombosis) ([place]) 1954    "after I had my first child" (05/26/2013)  . Pneumonia 2013    "once" (05/26/2013)  . Exertional shortness of breath   . H/O hiatal hernia   . Stroke ([place]) 05/26/2013    "very mild; affected my speech for a while" (05/26/2013)  . Arthritis     "lots; hands" (05/26/2013)  . Laryngeal carcinoma ([place]) hx of ca    remotely with resection and xrt/chronic hoarsemenss  . Lymphona, mantle cell, inguinal region/lower limb ([place])     "large c-cell" (05/26/2013)  . VARICOSE VEINS LOWER EXTREMITIES W/INFLAMMATION 01/15/2009  . PEPTIC ULCER DISEASE 07/06/2008  . OSTEOARTHRITIS 05/03/2007  . HCAP (healthcare-associated pneumonia) 06/26/2013  . Diverticulitis of colon with hemorrhage 07/24/2008  . ABDOMINAL INCISIONAL HERNIA 04/03/2010       . GERD 05/03/2007         MEDICATIONS: Current Outpatient Prescriptions on File Prior to Visit  Medication Sig Dispense Refill  . acetaminophen (TYLENOL) 650 MG CR tablet Take 650 mg by mouth every 8  (eight) hours as needed for pain. Reported on 10/28/2015    . ALPRAZolam (XANAX) 0.25 MG tablet Take 1 tablet (0.25 mg total) by mouth once as needed for anxiety (before procedure). 15 tablet 0  . atorvastatin (LIPITOR) 20 MG tablet TAKE ONE TABLET BY MOUTH ONCE DAILY 30 tablet 5  . bisoprolol (ZEBETA) 5 MG tablet TAKE ONE TABLET BY MOUTH ONCE DAILY 30 tablet 5  . cholecalciferol (VITAMIN D) 1000 UNITS tablet Take 1,000 Units by mouth daily.    . diclofenac sodium (VOLTAREN) 1 % GEL Apply 2 g topically 4 (four) times daily. 1 Tube 1  . gabapentin (NEURONTIN) 100 MG capsule Take 1 capsule (100 mg total) by mouth 3 (three) times daily. 90 capsule 4  . Glucosamine HCl (GLUCOSAMINE PO) Take 1 tablet by mouth daily.     Marland Kitchen levETIRAcetam (KEPPRA) 750 MG tablet TAKE ONE TABLET BY MOUTH TWICE DAILY 60 tablet 5  . loperamide (IMODIUM) 2 MG capsule Take 2 mg by mouth daily  as needed for diarrhea or loose stools.     Alveda Reasons 15 MG TABS tablet TAKE ONE TABLET BY MOUTH ONCE DAILY WITH  SUPPER 90 tablet 3   No current facility-administered medications on file prior to visit.    ALLERGIES: Allergies  Allergen Reactions  . Ativan [Lorazepam] Other (See Comments)    Severe hallucinations   . Propoxyphene Hcl   . Vancomycin   . Codeine Itching  . Penicillins Rash    FAMILY HISTORY: Family History  Problem Relation Age of Onset  . Colon cancer Sister     colon  . Hyperlipidemia Mother   . Stroke Mother   . Cancer Father     mesothelioma    SOCIAL HISTORY: Social History   Social History  . Marital Status: Married    Spouse Name: N/A  . Number of Children: 6  . Years of Education: N/A   Occupational History  . retired    Social History Main Topics  . Smoking status: Former Smoker -- 2.50 packs/day for 37 years    Types: Cigarettes    Quit date: 09/05/1984  . Smokeless tobacco: Never Used  . Alcohol Use: No  . Drug Use: No  . Sexual Activity: No   Other Topics Concern  . Not on  file   Social History Narrative   Married (husband patient of Dr. Yong Channel). 6 children (lost 2 for total 8). 15 grandkids. 6 greatgrandchildren.    Lives with husband. Husband drives. Patient does not drive.       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     REVIEW OF SYSTEMS: Constitutional: No fevers, chills, or sweats, no generalized fatigue, change in appetite Eyes: No visual changes, double vision, eye pain Ear, nose and throat: No hearing loss, ear pain, nasal congestion, sore throat Cardiovascular: No chest pain, palpitations Respiratory:  No shortness of breath at rest or with exertion, wheezes GastrointestinaI: No nausea, vomiting, diarrhea, abdominal pain, fecal incontinence Genitourinary:  No dysuria, urinary retention or frequency Musculoskeletal:  No neck pain, back pain Integumentary: No rash, pruritus, skin lesions Neurological: as above Psychiatric: No depression, insomnia, anxiety Endocrine: No palpitations, fatigue, diaphoresis, mood swings, change in appetite, change in weight, increased thirst Hematologic/Lymphatic:  No anemia, purpura, petechiae. Allergic/Immunologic: no itchy/runny eyes, nasal congestion, recent allergic reactions, rashes  PHYSICAL EXAM: Filed Vitals:   11/07/15 1431  BP: 140/72   General: No acute distress.  Patient appears well-groomed.   Head:  Normocephalic/atraumatic Eyes:  Fundoscopic exam unremarkable without vessel changes, exudates, hemorrhages or papilledema. Neck: supple, no paraspinal tenderness, full range of motion Heart:  Regular rate and rhythm Lungs:  Clear to auscultation bilaterally Back: No paraspinal tenderness Neurological Exam: alert and oriented to person, place, and time. Attention span and concentration intact, recent and remote memory intact, fund of knowledge intact.  Speech fluent and not dysarthric, language intact.  CN II-XII intact. Fundi not visualized  on inspection.  Bulk and tone normal, muscle strength 5/5 in upper extremities and 5-/5 lower extremities.  Deep tendon reflexes 1+ throughout except absent in ankles.  Toes downgoing.  Finger to nose testing with postural and kinetic tremor but no dysmetria.  Unsteady wide-based stance.  Difficult to walk and cautious when taking short strides.  IMPRESSION: Cardioembolic stroke secondary to atrial fibrillation Recurrent episodes of aphasia.  Consider possible symptomatic localization-related epilepsy with simple partial seizures Lumbar spinal stenosis with  neurogenic claudication.   Hyperlipidemia Hypertension  PLAN: After discussion, she will try Lyrica 75mg  twice daily Xarelto Keppra 750mg  twice daily Continue Lipitor (LDL goal should be less than 70) Blood pressure control  15 minutes spent face to face with patient, over 50% spent discussing management.  Metta Clines, DO  CC: [name], MD

## 2015-11-08 ENCOUNTER — Ambulatory Visit: Payer: Medicare Other | Admitting: Neurology

## 2015-11-19 ENCOUNTER — Inpatient Hospital Stay (HOSPITAL_COMMUNITY)
Admission: EM | Admit: 2015-11-19 | Discharge: 2015-11-22 | DRG: 871 | Disposition: A | Discharge status: Home Health | Payer: Medicare Other | Attending: Family Medicine | Admitting: Family Medicine

## 2015-11-19 ENCOUNTER — Encounter (HOSPITAL_COMMUNITY): Payer: Self-pay

## 2015-11-19 ENCOUNTER — Emergency Department (HOSPITAL_COMMUNITY): Payer: Medicare Other

## 2015-11-19 DIAGNOSIS — I10 Essential (primary) hypertension: Secondary | ICD-10-CM | POA: Diagnosis present

## 2015-11-19 DIAGNOSIS — A408 Other streptococcal sepsis: Secondary | ICD-10-CM | POA: Diagnosis present

## 2015-11-19 DIAGNOSIS — Z7901 Long term (current) use of anticoagulants: Secondary | ICD-10-CM

## 2015-11-19 DIAGNOSIS — N183 Chronic kidney disease, stage 3 unspecified: Secondary | ICD-10-CM | POA: Diagnosis present

## 2015-11-19 DIAGNOSIS — I130 Hypertensive heart and chronic kidney disease with heart failure and stage 1 through stage 4 chronic kidney disease, or unspecified chronic kidney disease: Secondary | ICD-10-CM | POA: Diagnosis present

## 2015-11-19 DIAGNOSIS — I5031 Acute diastolic (congestive) heart failure: Secondary | ICD-10-CM | POA: Diagnosis present

## 2015-11-19 DIAGNOSIS — Z8521 Personal history of malignant neoplasm of larynx: Secondary | ICD-10-CM

## 2015-11-19 DIAGNOSIS — M7989 Other specified soft tissue disorders: Secondary | ICD-10-CM

## 2015-11-19 DIAGNOSIS — E875 Hyperkalemia: Secondary | ICD-10-CM | POA: Diagnosis present

## 2015-11-19 DIAGNOSIS — L03115 Cellulitis of right lower limb: Secondary | ICD-10-CM | POA: Diagnosis present

## 2015-11-19 DIAGNOSIS — J189 Pneumonia, unspecified organism: Secondary | ICD-10-CM

## 2015-11-19 DIAGNOSIS — J9621 Acute and chronic respiratory failure with hypoxia: Secondary | ICD-10-CM | POA: Diagnosis present

## 2015-11-19 DIAGNOSIS — I4891 Unspecified atrial fibrillation: Secondary | ICD-10-CM | POA: Diagnosis present

## 2015-11-19 DIAGNOSIS — Z883 Allergy status to other anti-infective agents status: Secondary | ICD-10-CM

## 2015-11-19 DIAGNOSIS — Z9841 Cataract extraction status, right eye: Secondary | ICD-10-CM | POA: Diagnosis not present

## 2015-11-19 DIAGNOSIS — Z8711 Personal history of peptic ulcer disease: Secondary | ICD-10-CM

## 2015-11-19 DIAGNOSIS — Z8673 Personal history of transient ischemic attack (TIA), and cerebral infarction without residual deficits: Secondary | ICD-10-CM

## 2015-11-19 DIAGNOSIS — R7881 Bacteremia: Secondary | ICD-10-CM

## 2015-11-19 DIAGNOSIS — E785 Hyperlipidemia, unspecified: Secondary | ICD-10-CM | POA: Diagnosis present

## 2015-11-19 DIAGNOSIS — I2510 Atherosclerotic heart disease of native coronary artery without angina pectoris: Secondary | ICD-10-CM | POA: Diagnosis present

## 2015-11-19 DIAGNOSIS — Z8701 Personal history of pneumonia (recurrent): Secondary | ICD-10-CM | POA: Diagnosis not present

## 2015-11-19 DIAGNOSIS — G629 Polyneuropathy, unspecified: Secondary | ICD-10-CM | POA: Diagnosis present

## 2015-11-19 DIAGNOSIS — I480 Paroxysmal atrial fibrillation: Secondary | ICD-10-CM | POA: Diagnosis not present

## 2015-11-19 DIAGNOSIS — A419 Sepsis, unspecified organism: Secondary | ICD-10-CM

## 2015-11-19 DIAGNOSIS — D696 Thrombocytopenia, unspecified: Secondary | ICD-10-CM | POA: Diagnosis present

## 2015-11-19 DIAGNOSIS — J449 Chronic obstructive pulmonary disease, unspecified: Secondary | ICD-10-CM | POA: Diagnosis present

## 2015-11-19 DIAGNOSIS — B955 Unspecified streptococcus as the cause of diseases classified elsewhere: Secondary | ICD-10-CM

## 2015-11-19 DIAGNOSIS — C329 Malignant neoplasm of larynx, unspecified: Secondary | ICD-10-CM | POA: Diagnosis present

## 2015-11-19 DIAGNOSIS — Z8579 Personal history of other malignant neoplasms of lymphoid, hematopoietic and related tissues: Secondary | ICD-10-CM | POA: Diagnosis present

## 2015-11-19 DIAGNOSIS — Z87891 Personal history of nicotine dependence: Secondary | ICD-10-CM

## 2015-11-19 DIAGNOSIS — Z9842 Cataract extraction status, left eye: Secondary | ICD-10-CM

## 2015-11-19 DIAGNOSIS — K219 Gastro-esophageal reflux disease without esophagitis: Secondary | ICD-10-CM | POA: Diagnosis present

## 2015-11-19 DIAGNOSIS — Z96653 Presence of artificial knee joint, bilateral: Secondary | ICD-10-CM | POA: Diagnosis present

## 2015-11-19 DIAGNOSIS — C8510 Unspecified B-cell lymphoma, unspecified site: Secondary | ICD-10-CM | POA: Diagnosis present

## 2015-11-19 DIAGNOSIS — J9601 Acute respiratory failure with hypoxia: Secondary | ICD-10-CM

## 2015-11-19 DIAGNOSIS — I739 Peripheral vascular disease, unspecified: Secondary | ICD-10-CM | POA: Diagnosis present

## 2015-11-19 DIAGNOSIS — G40109 Localization-related (focal) (partial) symptomatic epilepsy and epileptic syndromes with simple partial seizures, not intractable, without status epilepticus: Secondary | ICD-10-CM | POA: Diagnosis present

## 2015-11-19 DIAGNOSIS — M4806 Spinal stenosis, lumbar region: Secondary | ICD-10-CM | POA: Diagnosis present

## 2015-11-19 DIAGNOSIS — Z8572 Personal history of non-Hodgkin lymphomas: Secondary | ICD-10-CM | POA: Diagnosis not present

## 2015-11-19 DIAGNOSIS — J440 Chronic obstructive pulmonary disease with acute lower respiratory infection: Secondary | ICD-10-CM | POA: Diagnosis present

## 2015-11-19 DIAGNOSIS — R0602 Shortness of breath: Secondary | ICD-10-CM | POA: Diagnosis not present

## 2015-11-19 DIAGNOSIS — N1831 Chronic kidney disease, stage 3a: Secondary | ICD-10-CM | POA: Diagnosis present

## 2015-11-19 HISTORY — DX: Unspecified convulsions: R56.9

## 2015-11-19 LAB — CBC WITH DIFFERENTIAL/PLATELET
BASOS ABS: 0 10*3/uL (ref 0.0–0.1)
BASOS PCT: 0 %
EOS ABS: 0.1 10*3/uL (ref 0.0–0.7)
Eosinophils Relative: 1 %
HCT: 40.5 % (ref 36.0–46.0)
HEMOGLOBIN: 12.2 g/dL (ref 12.0–15.0)
Lymphocytes Relative: 12 %
Lymphs Abs: 1 10*3/uL (ref 0.7–4.0)
MCH: 26.7 pg (ref 26.0–34.0)
MCHC: 30.1 g/dL (ref 30.0–36.0)
MCV: 88.6 fL (ref 78.0–100.0)
MONOS PCT: 5 %
Monocytes Absolute: 0.4 10*3/uL (ref 0.1–1.0)
Neutro Abs: 6.9 10*3/uL (ref 1.7–7.7)
Neutrophils Relative %: 82 %
Platelets: 144 10*3/uL — ABNORMAL LOW (ref 150–400)
RBC: 4.57 MIL/uL (ref 3.87–5.11)
RDW: 14.4 % (ref 11.5–15.5)
WBC: 8.4 10*3/uL (ref 4.0–10.5)

## 2015-11-19 LAB — URINALYSIS, ROUTINE W REFLEX MICROSCOPIC
BILIRUBIN URINE: NEGATIVE
Glucose, UA: NEGATIVE mg/dL
Hgb urine dipstick: NEGATIVE
KETONES UR: NEGATIVE mg/dL
Leukocytes, UA: NEGATIVE
NITRITE: NEGATIVE
PROTEIN: NEGATIVE mg/dL
SPECIFIC GRAVITY, URINE: 1.011 (ref 1.005–1.030)
pH: 7.5 (ref 5.0–8.0)

## 2015-11-19 LAB — COMPREHENSIVE METABOLIC PANEL
ALBUMIN: 3.9 g/dL (ref 3.5–5.0)
ALT: 16 U/L (ref 14–54)
AST: 20 U/L (ref 15–41)
Alkaline Phosphatase: 72 U/L (ref 38–126)
Anion gap: 12 (ref 5–15)
BILIRUBIN TOTAL: 0.9 mg/dL (ref 0.3–1.2)
BUN: 12 mg/dL (ref 6–20)
CO2: 27 mmol/L (ref 22–32)
Calcium: 9.4 mg/dL (ref 8.9–10.3)
Chloride: 103 mmol/L (ref 101–111)
Creatinine, Ser: 1.13 mg/dL — ABNORMAL HIGH (ref 0.44–1.00)
GFR calc Af Amer: 50 mL/min — ABNORMAL LOW (ref >60)
GFR calc non Af Amer: 43 mL/min — ABNORMAL LOW (ref >60)
GLUCOSE: 125 mg/dL — AB (ref 65–99)
POTASSIUM: 4.5 mmol/L (ref 3.5–5.1)
SODIUM: 142 mmol/L (ref 135–145)
TOTAL PROTEIN: 7.1 g/dL (ref 6.5–8.1)

## 2015-11-19 LAB — I-STAT CG4 LACTIC ACID, ED
LACTIC ACID, VENOUS: 2.6 mmol/L — AB (ref 0.5–2.0)
Lactic Acid, Venous: 1.03 mmol/L (ref 0.5–2.0)

## 2015-11-19 LAB — TROPONIN I: Troponin I: <0.03 ng/mL (ref <0.031)

## 2015-11-19 LAB — INFLUENZA PANEL BY PCR (TYPE A & B)
H1N1 flu by pcr: NOT DETECTED
Influenza A By PCR: NEGATIVE
Influenza B By PCR: NEGATIVE

## 2015-11-19 LAB — PROCALCITONIN: Procalcitonin: 4.86 ng/mL

## 2015-11-19 MED ORDER — LEVETIRACETAM 750 MG PO TABS
750.0000 mg | ORAL_TABLET | Freq: Two times a day (BID) | ORAL | Status: DC
  Administered 2015-11-19 – 2015-11-22 (×6): 750 mg via ORAL
  Filled 2015-11-19 (×6): qty 1

## 2015-11-19 MED ORDER — ALPRAZOLAM 0.25 MG PO TABS
0.2500 mg | ORAL_TABLET | Freq: Once | ORAL | Status: DC | PRN
  Administered 2015-11-19: 0.25 mg via ORAL
  Filled 2015-11-19: qty 1

## 2015-11-19 MED ORDER — ACETAMINOPHEN 650 MG RE SUPP
650.0000 mg | Freq: Once | RECTAL | Status: AC
  Administered 2015-11-19: 650 mg via RECTAL
  Filled 2015-11-19: qty 1

## 2015-11-19 MED ORDER — ACETAMINOPHEN 500 MG PO TABS
1000.0000 mg | ORAL_TABLET | Freq: Three times a day (TID) | ORAL | Status: DC | PRN
  Administered 2015-11-19 – 2015-11-20 (×2): 1000 mg via ORAL
  Filled 2015-11-19 (×3): qty 2

## 2015-11-19 MED ORDER — BISOPROLOL FUMARATE 5 MG PO TABS
5.0000 mg | ORAL_TABLET | Freq: Every day | ORAL | Status: DC
  Administered 2015-11-20 – 2015-11-21 (×2): 5 mg via ORAL
  Filled 2015-11-19 (×3): qty 1

## 2015-11-19 MED ORDER — SODIUM CHLORIDE 0.9 % IV BOLUS (SEPSIS)
1000.0000 mL | Freq: Once | INTRAVENOUS | Status: AC
  Administered 2015-11-19: 1000 mL via INTRAVENOUS

## 2015-11-19 MED ORDER — ACETAMINOPHEN 325 MG PO TABS: 650.0000 mg | ORAL_TABLET | Freq: Once | ORAL | Status: DC

## 2015-11-19 MED ORDER — DEXTROSE 5 % IV SOLN
1.0000 g | INTRAVENOUS | Status: DC
  Administered 2015-11-19: 1 g via INTRAVENOUS
  Filled 2015-11-19 (×2): qty 10

## 2015-11-19 MED ORDER — IPRATROPIUM-ALBUTEROL 0.5-2.5 (3) MG/3ML IN SOLN: 3.0000 mL | RESPIRATORY_TRACT | Status: DC | PRN

## 2015-11-19 MED ORDER — METOPROLOL TARTRATE 1 MG/ML IV SOLN: 5.0000 mg | INTRAVENOUS | Status: DC | PRN

## 2015-11-19 MED ORDER — SODIUM CHLORIDE 0.9 % IV SOLN
INTRAVENOUS | Status: DC
  Administered 2015-11-19 (×2): via INTRAVENOUS

## 2015-11-19 MED ORDER — LINEZOLID 600 MG/300ML IV SOLN
600.0000 mg | Freq: Once | INTRAVENOUS | Status: AC
  Administered 2015-11-19: 600 mg via INTRAVENOUS
  Filled 2015-11-19: qty 300

## 2015-11-19 MED ORDER — RIVAROXABAN 15 MG PO TABS
15.0000 mg | ORAL_TABLET | Freq: Every day | ORAL | Status: DC
  Administered 2015-11-19 – 2015-11-22 (×4): 15 mg via ORAL
  Filled 2015-11-19 (×4): qty 1

## 2015-11-19 MED ORDER — PREGABALIN 25 MG PO CAPS
75.0000 mg | ORAL_CAPSULE | Freq: Two times a day (BID) | ORAL | Status: DC
  Administered 2015-11-19 – 2015-11-22 (×6): 75 mg via ORAL
  Filled 2015-11-19 (×6): qty 3

## 2015-11-19 MED ORDER — DEXTROSE 5 % IV SOLN: 2.0000 g | INTRAVENOUS | Status: DC

## 2015-11-19 MED ORDER — DEXTROSE 5 % IV SOLN
2.0000 g | Freq: Once | INTRAVENOUS | Status: AC
  Administered 2015-11-19: 2 g via INTRAVENOUS
  Filled 2015-11-19: qty 2

## 2015-11-19 MED ORDER — GABAPENTIN 100 MG PO CAPS
100.0000 mg | ORAL_CAPSULE | Freq: Three times a day (TID) | ORAL | Status: DC
  Administered 2015-11-19 – 2015-11-22 (×8): 100 mg via ORAL
  Filled 2015-11-19 (×8): qty 1

## 2015-11-19 MED ORDER — DEXTROSE 5 % IV SOLN
500.0000 mg | INTRAVENOUS | Status: DC
  Administered 2015-11-19: 500 mg via INTRAVENOUS
  Filled 2015-11-19: qty 500

## 2015-11-19 MED ORDER — ATORVASTATIN CALCIUM 20 MG PO TABS
20.0000 mg | ORAL_TABLET | Freq: Every day | ORAL | Status: DC
  Administered 2015-11-19 – 2015-11-22 (×4): 20 mg via ORAL
  Filled 2015-11-19 (×4): qty 1

## 2015-11-19 MED ORDER — GUAIFENESIN ER 600 MG PO TB12
1200.0000 mg | ORAL_TABLET | Freq: Two times a day (BID) | ORAL | Status: DC
  Administered 2015-11-19 – 2015-11-22 (×6): 1200 mg via ORAL
  Filled 2015-11-19 (×6): qty 2

## 2015-11-19 NOTE — ED Notes (Signed)
Attempted to call report

## 2015-11-19 NOTE — ED Notes (Signed)
Pt taken to CT.

## 2015-11-19 NOTE — ED Notes (Signed)
Per [facility]: Pt from home, pts family called out for stroke, pt was shaking, pt stated that she was just cold. Stroke screen negative with EMS, no neuro deficits. When fire arrived pts oxygen saturation level was in the 60's. Pt was placed on NRB. EMS tried to take pt off of NRB and sats dropped to 70%, Pt placed on 4 L [place], sats increased to 91%. Pt then placed on 12 L NRB. Pt also complaining of leg pain  For the last 2 days.

## 2015-11-19 NOTE — Progress Notes (Signed)
Pharmacy Antibiotic Note  [name] is a 82 y.o. female admitted on 11/19/2015 with sepsis.  Pharmacy has been consulted for cefepime dosing. Tmax is 105.7 and WBC is WNL. Lactic acid is elevated at 2.6 and SCr is 1.13. Also ordered linezolid x 1 dose for now due to vancomycin allergy.   Plan: - Cefepime 2gm IV Q24H - F/u renal fxn, C&S, clinical status and trough at SS  Height: 5\' 3"  (160 cm) Weight: 190 lb (86.183 kg) IBW/kg (Calculated) : 52.4  Temp (24hrs), Avg:104.4 F (40.2 C), Min:103.1 F (39.5 C), Max:105.7 F (40.9 C)   Recent Labs Lab 11/19/15 0650 11/19/15 0738  WBC 8.4  --   CREATININE 1.13*  --   LATICACIDVEN  --  2.60*    Estimated Creatinine Clearance: 37.9 mL/min (by C-G formula based on Cr of 1.13).    Allergies  Allergen Reactions  . Ativan [Lorazepam] Other (See Comments)    Severe hallucinations   . Propoxyphene Hcl   . Codeine Itching  . Penicillins Rash  . Vancomycin Rash    Antimicrobials this admission: Cefepime 4/18>> Linezolid x 1 4/18  Dose adjustments this admission: N/A  Microbiology results: Pending  Thank you for allowing pharmacy to be a part of this patient's care.  Taniqua Issa, Rande Lawman 11/19/2015 8:22 AM

## 2015-11-19 NOTE — Progress Notes (Addendum)
Flu PCR negative; PCT elevated at 4.86 c/w bacterial infection (ie PNA)  455 pm: BP has trended up so will resume home dose Zebeta- has normal LVEF and no diastolic dysfunction so will continue IVFs at 100/hr for now  Erin Hearing, ANP

## 2015-11-19 NOTE — H&P (Signed)
History and Physical    [name] [id number] DOB: [birth year] DOA: 11/19/2015  Referring MD/NP/PA: Venora Maples / ER PCP: [name], MD  Outpatient Specialists: Oval Linsey / Cardiology; Tomi Likens / Neurology; Sherrill / Oncology Patient coming from: Private residence  Chief Complaint: SOB with shaking chills  HPI: [name] is a 82 y.o. female with medical history significant of COPD (not on oxygen), hypertension, atrial fibrillation on Xarelto, stage III chronic kidney disease, dyslipidemia, history of large B-cell lymphoma as well as laryngeal carcinoma followed by oncology as an outpatient, history of focal epilepsy with simple partial seizures, history of cardioembolic stroke and peripheral neuropathy with known spinal stenosis and neurogenic claudication. The patient presented to the ER from home after her husband noticed she was shaking diffusely this morning and complaining of feeling cold. Upon EMS arrival to the home patient was found to be hypoxemic with O2 saturations in the 60s and was transported to the ER on a nonrebreather mask. Of note patient was diagnosed with a viral upper respiratory infection on 3/27 by her PCP. She reports that she did not get better after that visit and continued with coughing with green to yellow productive sputum. She's had diarrhea for the past 3-4 days. She reports poor oral intake. She also reports left upper back pain worse with coughing. She denied photophobia or neck pain or rigidity.  ED Course:  Initial temperature was 103.1 orally with a MAXIMUM TEMPERATURE of 105.7 rectal,  BP 174/96,  pulse 111 and respirations 26 Follow-up vital signs temp 102.3,  BP 108/71, pulse 103 and respirations 23 Patient has been transitioned to 3 L nasal cannula oxygen with saturations 99%. PCXR: Laser lung opacities concerning for bronchopneumonia-unexpected lucency below the right diaphragm (see CT below regarding gallstone) CT abdomen and pelvis without  contrast: Trace pleural effusions with increased dependent opacities both lung bases-there is a 2 cm peripherally calcified gallstone which is unchanged from previous studies-fatty pancreas-urinary tract unremarkable-sigmoid colon anastomosis normal-previously demonstrated retroperitoneal and pelvic adenopathy substantially improved-progressive disc degeneration and lower lumbar spine contribute into severe multifactorial spinal stenosis at L4 and L5 Lab data: Initial lactate 2.6 decreased 1.3 after treatment in ER, Na 142, K 4.5, BUN 12, Cr 1.13, glucose 125, troponin less than 0.03, WBC 8400 with neutrophils 82% and normal absolute neutrophils, platelets 144,000, hemoglobin 12.2,  urinalysis unremarkable; blood cultures and urine culture obtained in the ER Normal saline bolus 2 L Tylenol 650 mg PR 1 Maxipime 2 g IV 1 Zyvox 600 mg IV 1  Review of Systems:  In addition to the HPI above,  No Headache, changes with Vision or hearing, new weakness, tingling, numbness in any extremity No problems swallowing food or Liquids, indigestion/reflux No Chest pain, Cough or Shortness of Breath, palpitations, orthopnea or DOE No Abdominal pain, N/V; no melena or hematochezia, no dark tarry stools, Bowel movements are regular, No dysuria, hematuria or flank pain No new skin rashes, lesions, masses or bruises, No new joints pains-aches No recent weight gain or loss No polyuria, polydypsia or polyphagia,   Past Medical History  Diagnosis Date  . GERD (gastroesophageal reflux disease)   . History of TIAs   . PUD (peptic ulcer disease)   . PVD (peripheral vascular disease) ([place])   . Hyperlipidemia   . Anxiety   . Depression   . Blood transfusion     "w/1st miscarriage" (05/26/2013)  . COPD (chronic obstructive pulmonary disease) ([place])   . Hypertension   . Family history of anesthesia  complication     "sisters also get sick as a dog" (05/26/2013)  . Coronary artery disease   . Heart murmur      "when I was a child" (05/26/2013)  . DVT (deep venous thrombosis) ([place]) 1954    "after I had my first child" (05/26/2013)  . Pneumonia 2013    "once" (05/26/2013)  . Exertional shortness of breath   . H/O hiatal hernia   . Stroke ([place]) 05/26/2013    "very mild; affected my speech for a while" (05/26/2013)  . Arthritis     "lots; hands" (05/26/2013)  . Laryngeal carcinoma ([place]) hx of ca    remotely with resection and xrt/chronic hoarsemenss  . Lymphona, mantle cell, inguinal region/lower limb ([place])     "large c-cell" (05/26/2013)  . VARICOSE VEINS LOWER EXTREMITIES W/INFLAMMATION 01/15/2009  . PEPTIC ULCER DISEASE 07/06/2008  . OSTEOARTHRITIS 05/03/2007  . HCAP (healthcare-associated pneumonia) 06/26/2013  . Diverticulitis of colon with hemorrhage 07/24/2008  . ABDOMINAL INCISIONAL HERNIA 04/03/2010       . GERD 05/03/2007       . Seizures [facility])     Past Surgical History  Procedure Laterality Date  . 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 Tubal ligation  1972  . Radical neck dissection  1986    "larynx cancer" (05/26/2013)  . Total knee arthroplasty Bilateral     2002 left 2008 right  . Ileostomy closure  01/2010  . Cataract extraction  2005    bilateral  . Carotid endarterectomy Bilateral 2004    2004 a month apart right and left  . Tonsillectomy and adenoidectomy  1936    "tonsils grew back; no 2nd OR" (05/26/2013)  . Hernia repair  05/2010    ventral hernia repair  . Carpal tunnel release Left ?1960's  . Abdominal exploration surgery  12/01/2012  . Appendectomy  09/13/2008  . Colon surgery  09/13/2008    Laparoscopic assisted converted to open sigmoid colectomy.Archie Endo 09/13/2008 (05/26/2013)  . Dilation and curettage of uterus  1950's    "had 2 for miscarriages" (05/26/2013)     reports that she quit smoking about 31 years ago. Her smoking use included Cigarettes. She has a 92.5 pack-year smoking history. She has  never used smokeless tobacco. She reports that she does not drink alcohol or use illicit drugs.  Allergies  Allergen Reactions  . Ativan [Lorazepam] Other (See Comments)    Severe hallucinations   . Propoxyphene Hcl   . Codeine Itching  . Penicillins Rash  . Vancomycin Rash    Family History  Problem Relation Age of Onset  . Colon cancer Sister     colon  . Hyperlipidemia Mother   . Stroke Mother   . Cancer Father     mesothelioma    Prior to Admission medications   Medication Sig Start Date End Date Taking? Authorizing Provider  acetaminophen (TYLENOL) 650 MG CR tablet Take 650 mg by mouth every 8 (eight) hours as needed for pain. Reported on 10/28/2015   Yes Historical Provider, MD  ALPRAZolam Duanne Moron) 0.25 MG tablet Take 1 tablet (0.25 mg total) by mouth once as needed for anxiety (before procedure). 09/04/15  Yes Marin Olp, MD  atorvastatin (LIPITOR) 20 MG tablet TAKE ONE TABLET BY MOUTH ONCE DAILY 09/30/15  Yes Marin Olp, MD  bisoprolol (ZEBETA) 5 MG tablet TAKE ONE TABLET BY MOUTH ONCE DAILY 07/09/15  Yes Marin Olp, MD  cholecalciferol (VITAMIN  D) 1000 UNITS tablet Take 1,000 Units by mouth daily.   Yes Historical Provider, MD  diclofenac sodium (VOLTAREN) 1 % GEL Apply 2 g topically 4 (four) times daily. 01/22/14  Yes Kennyth Arnold, FNP  gabapentin (NEURONTIN) 100 MG capsule Take 1 capsule (100 mg total) by mouth 3 (three) times daily. 06/26/15  Yes Adam Telford Nab, DO  Glucosamine HCl (GLUCOSAMINE PO) Take 1 tablet by mouth daily.    Yes Historical Provider, MD  levETIRAcetam (KEPPRA) 750 MG tablet TAKE ONE TABLET BY MOUTH TWICE DAILY 04/15/15  Yes Pieter Partridge, DO  loperamide (IMODIUM) 2 MG capsule Take 2 mg by mouth daily as needed for diarrhea or loose stools.    Yes Historical Provider, MD  pregabalin (LYRICA) 75 MG capsule Take 75 mg by mouth 2 (two) times daily.   Yes Historical Provider, MD  XARELTO 15 MG TABS tablet TAKE ONE TABLET BY MOUTH ONCE DAILY  WITH  SUPPER 10/18/15  Yes Marin Olp, MD    Physical Exam: Filed Vitals:   11/19/15 0815 11/19/15 0830 11/19/15 0910 11/19/15 0915  BP: 124/73 120/77  108/71  Pulse:  109  102  Temp:   102.3 F (39.1 C)   TempSrc:   Oral   Resp:  18  23  Height:      Weight:      SpO2:  93%  95%      Constitutional: NAD, calm, comfortable Filed Vitals:   11/19/15 0815 11/19/15 0830 11/19/15 0910 11/19/15 0915  BP: 124/73 120/77  108/71  Pulse:  109  102  Temp:   102.3 F (39.1 C)   TempSrc:   Oral   Resp:  18  23  Height:      Weight:      SpO2:  93%  95%   Eyes: PERRL, lids and conjunctivae normal ENMT: Mucous membranes are dry with mild yellow exudate as well as dried expectorated sputum on the posterior tongue. Posterior pharynx clear of any lesions.Normal dentition. Vocalization is hoarse Neck: normal, supple, no masses, no thyromegaly Respiratory: clear to auscultation bilaterally, no wheezing, bibasilar crackles greater on the left. Normal respiratory effort. No accessory muscle use. 3 L oxygen Cardiovascular: Regular rate and rhythm, no murmurs / rubs / gallops. Trace bilateral lower extremity edema. 2+ pedal pulses. No carotid bruits.  Abdomen: no tenderness, no masses palpated. No hepatosplenomegaly. Bowel sounds positive.  Musculoskeletal: no clubbing / cyanosis. No joint deformity upper and lower extremities. Good ROM, no contractures. Normal muscle tone.  Skin: no esions, ulcers. No induration-patient has chronic recurrent rash on left anterior tibia that currently measures about 15 cm x 6 cm with irregular borders and confluent with redness and is raised and patient reports is "itchy" Neurologic: CN 2-12 grossly intact. Sensation intact, DTR normal. Strength 5/5 in all 4.  Psychiatric: Normal judgment and insight. Alert and oriented x 3. Normal mood.    Labs on Admission: I have personally reviewed following labs and imaging studies  CBC:  Recent Labs Lab  11/19/15 0650  WBC 8.4  NEUTROABS 6.9  HGB 12.2  HCT 40.5  MCV 88.6  PLT 123456*   Basic Metabolic Panel:  Recent Labs Lab 11/19/15 0650  NA 142  K 4.5  CL 103  CO2 27  GLUCOSE 125*  BUN 12  CREATININE 1.13*  CALCIUM 9.4   GFR: Estimated Creatinine Clearance: 37.9 mL/min (by C-G formula based on Cr of 1.13). Liver Function Tests:  Recent Labs Lab 11/19/15  0650  AST 20  ALT 16  ALKPHOS 72  BILITOT 0.9  PROT 7.1  ALBUMIN 3.9   No results for input(s): LIPASE, AMYLASE in the last 168 hours. No results for input(s): AMMONIA in the last 168 hours. Coagulation Profile: No results for input(s): INR, PROTIME in the last 168 hours. Cardiac Enzymes:  Recent Labs Lab 11/19/15 0650  TROPONINI <0.03   BNP (last 3 results) No results for input(s): PROBNP in the last 8760 hours. HbA1C: No results for input(s): HGBA1C in the last 72 hours. CBG: No results for input(s): GLUCAP in the last 168 hours. Lipid Profile: No results for input(s): CHOL, HDL, LDLCALC, TRIG, CHOLHDL, LDLDIRECT in the last 72 hours. Thyroid Function Tests: No results for input(s): TSH, T4TOTAL, FREET4, T3FREE, THYROIDAB in the last 72 hours. Anemia Panel: No results for input(s): VITAMINB12, FOLATE, FERRITIN, TIBC, IRON, RETICCTPCT in the last 72 hours. Urine analysis:    Component Value Date/Time   COLORURINE YELLOW 11/19/2015 0723   APPEARANCEUR CLEAR 11/19/2015 0723   LABSPEC 1.011 11/19/2015 0723   PHURINE 7.5 11/19/2015 0723   GLUCOSEU NEGATIVE 11/19/2015 0723   HGBUR NEGATIVE 11/19/2015 0723   HGBUR trace-lysed 02/13/2010 0830   BILIRUBINUR NEGATIVE 11/19/2015 0723   BILIRUBINUR neg 12/23/2013 1221   KETONESUR NEGATIVE 11/19/2015 0723   PROTEINUR NEGATIVE 11/19/2015 0723   PROTEINUR neg 12/23/2013 1221   UROBILINOGEN 0.2 08/05/2014 1715   UROBILINOGEN 0.2 12/23/2013 1221   NITRITE NEGATIVE 11/19/2015 0723   NITRITE neg 12/23/2013 1221   LEUKOCYTESUR NEGATIVE 11/19/2015 0723    Sepsis Labs: @LABRCNTIP (procalcitonin:4,lacticidven:4) )No results found for this or any previous visit (from the past 240 hour(s)).   Radiological Exams on Admission: Ct Abdomen Pelvis Wo Contrast  11/19/2015  CLINICAL DATA:  Altered mental status with fever. Possible free intraperitoneal air on chest radiographs. History of B-cell lymphoma. EXAM: CT ABDOMEN AND PELVIS WITHOUT CONTRAST TECHNIQUE: Multidetector CT imaging of the abdomen and pelvis was performed following the standard protocol without IV contrast. COMPARISON:  PET-CT 06/10/2012.  Abdominal pelvic CT 01/14/2012. FINDINGS: Examination is mildly motion degraded. Lower chest: Trace pleural effusions with increased dependent opacities at both lung bases, probably atelectasis. No evidence of basilar pneumothorax. The heart is enlarged. There is atherosclerosis of the aorta and coronary arteries. Hepatobiliary: The liver demonstrates mildly decreased density consistent with steatosis. No focal lesions are identified on noncontrast imaging. There is a 2 cm peripherally calcified gallstone which appears unchanged. No evidence of gallbladder wall thickening or biliary dilatation. Pancreas: Fatty replacement without focal abnormality or surrounding inflammation. Spleen: Normal in size without focal abnormality. Adrenals/Urinary Tract: Both adrenal glands appear normal. Bilateral renal calcifications appear vascular. No definite urinary tract calculus or hydronephrosis. There is mild renal cortical thinning bilaterally. The bladder appears unremarkable. Stomach/Bowel: No evidence of bowel wall thickening, distention or surrounding inflammatory change. The sigmoid colon anastomosis appears normal. Vascular/Lymphatic: The previously demonstrated retroperitoneal and pelvic adenopathy has substantially improved. The largest remaining node measures 11 mm short axis in the left periaortic region (image 33). Extensive aortic and branch vessel atherosclerosis  again noted. Reproductive: The uterus and ovaries appear normal. No evidence of adnexal mass. Other: No evidence of free intraperitoneal air, ascites or peritoneal nodularity. Musculoskeletal: No acute or significant osseous findings. There is progressive degenerative disc disease at L4-5 and L5-S1. A broad-based central disc protrusion at L4-5 contributes to severe multifactorial spinal stenosis. IMPRESSION: 1. No acute abdominal pelvic findings demonstrated. No evidence of pneumoperitoneum. 2. Increased dependent opacities at both  lung bases, probably atelectasis. 3. Diffuse atherosclerosis. 4. Improvement in previously demonstrated adenopathy consistent with treated lymphoma. 5. Progressive disc degeneration in the lower lumbar spine contributing to severe multifactorial spinal stenosis at L4-5. 6. Cholelithiasis. Electronically Signed   By: Richardean Sale M.D.   On: 11/19/2015 08:37   Dg Chest Portable 1 View  11/19/2015  CLINICAL DATA:  Fever and altered mental status EXAM: PORTABLE CHEST 1 VIEW COMPARISON:  06/26/2013 FINDINGS: Patchy lung opacity at bases. Chronic mild cardiomegaly with aortic tortuosity. No effusion or pneumothorax. Symmetric biapical pleural thickening that is stable. Unexpected and unexplained lucency projecting below the right diaphragm. This could be artifact from overlying interfaces, but would exclude pneumoperitoneum with dedicated imaging - upright KUB or noncontrast abdominal CT depending on suspicion. These results were called by telephone at the time of interpretation on 11/19/2015 at 7:13 am to Dr. Jola Schmidt , who verbally acknowledged these results. IMPRESSION: 1. Basilar lung opacities primarily concerning for bronchopneumonia or aspiration in this clinical setting. 2. Unexpected lucency below the right diaphragm, as above. Would obtain dedicated abdominal imaging to exclude pneumoperitoneum. Electronically Signed   By: Monte Fantasia M.D.   On: 11/19/2015 07:14      Assessment/Plan Principal Problem:   Sepsis due to pneumonia [facility]) -Patient meets sepsis criteria/physiology as follows: Temperature greater than 100.9, initial heart rate greater than 90, initial respiratory rate 20, initial lactate was slightly > 2, and altered mentation at presentation -Admit to stepdown/inpatient -Treat as community-acquired pneumonia with Rocephin and Zithromax -Ck PCT -Check influenza PCR, sputum culture, urinary strep -Follow up on blood cultures obtained in ER -Urinalysis unremarkable although patient does have thrombocytopenia so follow-up on urine culture -Continue fluid hydration at 100 mL/h -Second lactic acid has normalized to no indication to further cycle  Active Problems:   Acute respiratory failure with hypoxia 2/2 COPD w/PNA -Not actively wheezing but will provide bronchodilators prn -Add guaifenesin -Ensure adequate hydration -No indication for steroids -Flutter valve -Encourage mobilization    Essential hypertension -Blood pressure somewhat suboptimal in setting of acute infection and dehydration -Have decreased his Zebeta dosage from 5 to 2.5 mg daily    Atrial fibrillation  -Has borderline tachycardia without evidence of RVR -Continue beta blocker as above -Continue Xarelto -CHADVASc = 6    CKD (chronic kidney disease), stage III -Renal function stable with creatinine just slightly above baseline    Hyperlipidemia -Continue preadmission Lipitor    History of Large B-cell lymphoma /Laryngeal carcinoma  -Last evaluated by oncology September 2016 with determination that she is in clinical remission from non-Hodgkin's lymphoma with plans to return for office visit in 8 months    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  -Reported as "shaking" at home with this in setting of high fever and appears to be reflective of rigors since patient did not have any kind of focal tonic/clonic activity and did not have any  type of postictal phase -Continue preadmission Keppra    History of cardioembolic stroke -Chronic anticoagulation as above     DVT prophylaxis: Xarelto  Code Status: Full code  Family Communication: With spouse Anoush Plagman as well as daughter at bedside Disposition Plan: Anticipate discharge back to preadmission home environment once medically stable Consults called: None Admission status: Inpatient Mobility: Cane and walker prior to admission   Sangeeta Youse L. ANP-BC [facility] Pager [phone]   If 7PM-7AM, please contact night-coverage [email or web address] Password [facility]  11/19/2015, 10:17 AM

## 2015-11-19 NOTE — ED Notes (Signed)
[facility] notified. Spoke w/Thomas.

## 2015-11-19 NOTE — ED Provider Notes (Signed)
CSN: [id number]     Arrival date & time 11/19/15  [id number] History   First MD Initiated Contact with Patient 11/19/15 [phone]     Chief Complaint  Patient presents with  . Shortness of Breath     HPI   Patient is brought to the emergency department by EMS after family found the patient with diffuse shaking all over her body this morning.  She has been in her normal state of health over the past several days.  EMS states that initially she was hypoxic in the 60s and she was brought to the emergency department on a nonrebreather.  On arrival to emergency department the patient denies significant shortness of breath.  Family does not report any cough.  No confusion reported by family.  Patient found to have a fever of 103.1 orally on arrival to the ER.  Patient currently on 4 L nasal cannula.  EMS reports no weakness on their neurologic exam.  No reports of abdominal pain nausea vomiting or diarrhea.   On re-eval pt now more altered and can states she has no pain but cannot answer any further questions. Per nursing staff family reported that pt has some residual speech issues after past strokes but is at baseline A&O and very appropriate. EMS also reported that pt has had several days of diarrhea. She does not typically require oxygen at baseline.  Past Medical History  Diagnosis Date  . GERD (gastroesophageal reflux disease)   . History of TIAs   . PUD (peptic ulcer disease)   . PVD (peripheral vascular disease) ([place])   . Hyperlipidemia   . Anxiety   . Depression   . Blood transfusion     "w/1st miscarriage" (05/26/2013)  . COPD (chronic obstructive pulmonary disease) ([place])   . Hypertension   . Family history of anesthesia complication     "sisters also get sick as a dog" (05/26/2013)  . Coronary artery disease   . Heart murmur     "when I was a child" (05/26/2013)  . DVT (deep venous thrombosis) ([place]) 1954    "after I had my first child" (05/26/2013)  . Pneumonia 2013    "once"  (05/26/2013)  . Exertional shortness of breath   . H/O hiatal hernia   . Stroke ([place]) 05/26/2013    "very mild; affected my speech for a while" (05/26/2013)  . Arthritis     "lots; hands" (05/26/2013)  . Laryngeal carcinoma ([place]) hx of ca    remotely with resection and xrt/chronic hoarsemenss  . Lymphona, mantle cell, inguinal region/lower limb ([place])     "large c-cell" (05/26/2013)  . VARICOSE VEINS LOWER EXTREMITIES W/INFLAMMATION 01/15/2009  . PEPTIC ULCER DISEASE 07/06/2008  . OSTEOARTHRITIS 05/03/2007  . HCAP (healthcare-associated pneumonia) 06/26/2013  . Diverticulitis of colon with hemorrhage 07/24/2008  . ABDOMINAL INCISIONAL HERNIA 04/03/2010       . GERD 05/03/2007        Past Surgical History  Procedure Laterality Date  . 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 Tubal ligation  1972  . Radical neck dissection  1986    "larynx cancer" (05/26/2013)  . Total knee arthroplasty Bilateral     2002 left 2008 right  . Ileostomy closure  01/2010  . Cataract extraction  2005    bilateral  . Carotid endarterectomy Bilateral 2004    2004 a month apart right and left  . Tonsillectomy and adenoidectomy  1936    "tonsils grew  back; no 2nd OR" (05/26/2013)  . Hernia repair  05/2010    ventral hernia repair  . Carpal tunnel release Left ?1960's  . Abdominal exploration surgery  12/01/2012  . Appendectomy  09/13/2008  . Colon surgery  09/13/2008    Laparoscopic assisted converted to open sigmoid colectomy.Archie Endo 09/13/2008 (05/26/2013)  . Dilation and curettage of uterus  1950's    "had 2 for miscarriages" (05/26/2013)   Family History  Problem Relation Age of Onset  . Colon cancer Sister     colon  . Hyperlipidemia Mother   . Stroke Mother   . Cancer Father     mesothelioma   Social History  Substance Use Topics  . Smoking status: Former Smoker -- 2.50 packs/day for 37 years    Types: Cigarettes    Quit date: 09/05/1984   . Smokeless tobacco: Never Used  . Alcohol Use: No   OB History    No data available     Review of Systems  Unable to perform ROS: Mental status change      Allergies  Ativan; Propoxyphene hcl; Vancomycin; Codeine; and Penicillins  Home Medications   Prior to Admission medications   Medication Sig Start Date End Date Taking? Authorizing Provider  acetaminophen (TYLENOL) 650 MG CR tablet Take 650 mg by mouth every 8 (eight) hours as needed for pain. Reported on 10/28/2015    Historical Provider, MD  ALPRAZolam Duanne Moron) 0.25 MG tablet Take 1 tablet (0.25 mg total) by mouth once as needed for anxiety (before procedure). 09/04/15   Marin Olp, MD  atorvastatin (LIPITOR) 20 MG tablet TAKE ONE TABLET BY MOUTH ONCE DAILY 09/30/15   Marin Olp, MD  bisoprolol (ZEBETA) 5 MG tablet TAKE ONE TABLET BY MOUTH ONCE DAILY 07/09/15   Marin Olp, MD  cholecalciferol (VITAMIN D) 1000 UNITS tablet Take 1,000 Units by mouth daily.    Historical Provider, MD  diclofenac sodium (VOLTAREN) 1 % GEL Apply 2 g topically 4 (four) times daily. 01/22/14   Kennyth Arnold, FNP  gabapentin (NEURONTIN) 100 MG capsule Take 1 capsule (100 mg total) by mouth 3 (three) times daily. 06/26/15   Pieter Partridge, DO  Glucosamine HCl (GLUCOSAMINE PO) Take 1 tablet by mouth daily.     Historical Provider, MD  levETIRAcetam (KEPPRA) 750 MG tablet TAKE ONE TABLET BY MOUTH TWICE DAILY 04/15/15   Pieter Partridge, DO  loperamide (IMODIUM) 2 MG capsule Take 2 mg by mouth daily as needed for diarrhea or loose stools.     Historical Provider, MD  XARELTO 15 MG TABS tablet TAKE ONE TABLET BY MOUTH ONCE DAILY WITH  SUPPER 10/18/15   Marin Olp, MD   Pulse 109  Temp(Src) 103.1 F (39.5 C) (Oral)  Resp 24  Ht 5\' 3"  (1.6 m)  Wt 190 lb (86.183 kg)  BMI 33.67 kg/m2  SpO2 94% Physical Exam  Constitutional: She appears well-developed and well-nourished. No distress.  Tremulous/rigors  HENT:  Head: Normocephalic and  atraumatic.  Right Ear: External ear normal.  Left Ear: External ear normal.  Eyes: Conjunctivae and EOM are normal. Pupils are equal, round, and reactive to light.  Neck: Normal range of motion.  Cardiovascular: Normal rate, regular rhythm and normal heart sounds.   Pulmonary/Chest: Effort normal.  Mild wheezing bilaterally No increased WOB, no tachypnea  Abdominal: Soft. She exhibits no distension. There is no tenderness.  Abdomen mildly distended  Musculoskeletal: Normal range of motion.  Neurological: She is alert.  Moving all 4 extremities freely Will respond to some commands   Skin: Skin is warm and dry.  Psychiatric: She has a normal mood and affect. Judgment normal.  Nursing note and vitals reviewed.  Filed Vitals:   11/19/15 0745 11/19/15 0815 11/19/15 0830 11/19/15 0910  BP: 138/74 124/73 120/77   Pulse: 110  109   Temp:    102.3 F (39.1 C)  TempSrc:    Oral  Resp: 26  18   Height:      Weight:      SpO2: 100%  93%      ED Course  Procedures (including critical care time) Labs Review Labs Reviewed  CBC WITH DIFFERENTIAL/PLATELET - Abnormal; Notable for the following:    Platelets 144 (*)    All other components within normal limits  COMPREHENSIVE METABOLIC PANEL - Abnormal; Notable for the following:    Glucose, Bld 125 (*)    Creatinine, Ser 1.13 (*)    GFR calc non Af Amer 43 (*)    GFR calc Af Amer 50 (*)    All other components within normal limits  I-STAT CG4 LACTIC ACID, ED - Abnormal; Notable for the following:    Lactic Acid, Venous 2.60 (*)    All other components within normal limits  CULTURE, BLOOD (ROUTINE X 2)  CULTURE, BLOOD (ROUTINE X 2)  URINE CULTURE  TROPONIN I  URINALYSIS, ROUTINE W REFLEX MICROSCOPIC (NOT AT [facility])    Imaging Review Ct Abdomen Pelvis Wo Contrast  11/19/2015  CLINICAL DATA:  Altered mental status with fever. Possible free intraperitoneal air on chest radiographs. History of B-cell lymphoma. EXAM: CT ABDOMEN AND  PELVIS WITHOUT CONTRAST TECHNIQUE: Multidetector CT imaging of the abdomen and pelvis was performed following the standard protocol without IV contrast. COMPARISON:  PET-CT 06/10/2012.  Abdominal pelvic CT 01/14/2012. FINDINGS: Examination is mildly motion degraded. Lower chest: Trace pleural effusions with increased dependent opacities at both lung bases, probably atelectasis. No evidence of basilar pneumothorax. The heart is enlarged. There is atherosclerosis of the aorta and coronary arteries. Hepatobiliary: The liver demonstrates mildly decreased density consistent with steatosis. No focal lesions are identified on noncontrast imaging. There is a 2 cm peripherally calcified gallstone which appears unchanged. No evidence of gallbladder wall thickening or biliary dilatation. Pancreas: Fatty replacement without focal abnormality or surrounding inflammation. Spleen: Normal in size without focal abnormality. Adrenals/Urinary Tract: Both adrenal glands appear normal. Bilateral renal calcifications appear vascular. No definite urinary tract calculus or hydronephrosis. There is mild renal cortical thinning bilaterally. The bladder appears unremarkable. Stomach/Bowel: No evidence of bowel wall thickening, distention or surrounding inflammatory change. The sigmoid colon anastomosis appears normal. Vascular/Lymphatic: The previously demonstrated retroperitoneal and pelvic adenopathy has substantially improved. The largest remaining node measures 11 mm short axis in the left periaortic region (image 33). Extensive aortic and branch vessel atherosclerosis again noted. Reproductive: The uterus and ovaries appear normal. No evidence of adnexal mass. Other: No evidence of free intraperitoneal air, ascites or peritoneal nodularity. Musculoskeletal: No acute or significant osseous findings. There is progressive degenerative disc disease at L4-5 and L5-S1. A broad-based central disc protrusion at L4-5 contributes to severe  multifactorial spinal stenosis. IMPRESSION: 1. No acute abdominal pelvic findings demonstrated. No evidence of pneumoperitoneum. 2. Increased dependent opacities at both lung bases, probably atelectasis. 3. Diffuse atherosclerosis. 4. Improvement in previously demonstrated adenopathy consistent with treated lymphoma. 5. Progressive disc degeneration in the lower lumbar spine contributing to severe multifactorial spinal stenosis at L4-5. 6. Cholelithiasis. Electronically  Signed   By: Richardean Sale M.D.   On: 11/19/2015 08:37   Dg Chest Portable 1 View  11/19/2015  CLINICAL DATA:  Fever and altered mental status EXAM: PORTABLE CHEST 1 VIEW COMPARISON:  06/26/2013 FINDINGS: Patchy lung opacity at bases. Chronic mild cardiomegaly with aortic tortuosity. No effusion or pneumothorax. Symmetric biapical pleural thickening that is stable. Unexpected and unexplained lucency projecting below the right diaphragm. This could be artifact from overlying interfaces, but would exclude pneumoperitoneum with dedicated imaging - upright KUB or noncontrast abdominal CT depending on suspicion. These results were called by telephone at the time of interpretation on 11/19/2015 at 7:13 am to Dr. Jola Schmidt , who verbally acknowledged these results. IMPRESSION: 1. Basilar lung opacities primarily concerning for bronchopneumonia or aspiration in this clinical setting. 2. Unexpected lucency below the right diaphragm, as above. Would obtain dedicated abdominal imaging to exclude pneumoperitoneum. Electronically Signed   By: Monte Fantasia M.D.   On: 11/19/2015 07:14   I have personally reviewed and evaluated these images and lab results as part of my medical decision-making.   EKG Interpretation None      MDM   Final diagnoses:  Sepsis, due to unspecified organism [facility])  Community acquired pneumonia    Patient with fever 103.  No hypotension.  Mild tachycardia.  Fever control, infection workup.  1 L bolus at this time.   Lactate pending.  Lactate 2.60. CXR reveals basilar lung opacities concerning for pneumonia. Pt is febrile to 103.1 orally, 105.7 rectally. She is tachycardic. Will call code sepsis. Vancomycin/cefepime ordered. However, pt has vanc allergy. I spoke to pharmacist who recommended one time dose of linezolid. Tylenol ordered. Fluids are running. CXR also with area of concern under right diaphragm that could be artifact but also concern for pneumoperitoneum. CT abd/pelvis without contrast ordered.  CT reveals no e/o pneumoperitoneum or other acute abdominal abnormalities. Improvement in previously treated lymphoma. 2nd L bolus ordered. Pt receiving first dose of cefepime. Temp and HR improving. No hypoxia on nasal cannula. Now pt is much more alert, she is oriented and conversational. She does endorse cough recently. She lives at home with her husband.  Will call hospitalist for admission for sepsis likely secondary to CAP.  I spoke to Erin Hearing of hospitalist service who will admit pt to stepdown inpatient.   Anne Ng, PA-C 11/19/15 [place], PA-C 11/19/15 [place], MD 11/20/15 [phone]

## 2015-11-19 NOTE — ED Notes (Signed)
Pt's family member came to desk asking for help to get pt on to bed pain. I told her I would go find [place], her nurse assigned to her. She stated that the mother, the pt, did not want help from female nurses. Myself and Martie Round, another RN in the same pod went to go find another female nurse that could help [place]. Once Martie Round found a female nurse, the family was at the desk saying it was too late. Martie Round RN and [employer] RN telling them they would help clean the pt up, the family members declined their help. Family member came to desk asking where they can put dirty sheets. I go into room, open the dirty linen for her to put the sheets in and tell her that the female nurses would've helped clean the pt up, this same family member said that it was already too late.

## 2015-11-20 ENCOUNTER — Inpatient Hospital Stay (HOSPITAL_COMMUNITY): Payer: Medicare Other

## 2015-11-20 DIAGNOSIS — G40109 Localization-related (focal) (partial) symptomatic epilepsy and epileptic syndromes with simple partial seizures, not intractable, without status epilepticus: Secondary | ICD-10-CM

## 2015-11-20 DIAGNOSIS — I10 Essential (primary) hypertension: Secondary | ICD-10-CM

## 2015-11-20 DIAGNOSIS — I480 Paroxysmal atrial fibrillation: Secondary | ICD-10-CM

## 2015-11-20 DIAGNOSIS — C8510 Unspecified B-cell lymphoma, unspecified site: Secondary | ICD-10-CM

## 2015-11-20 DIAGNOSIS — C8589 Other specified types of non-Hodgkin lymphoma, extranodal and solid organ sites: Secondary | ICD-10-CM

## 2015-11-20 DIAGNOSIS — L03115 Cellulitis of right lower limb: Secondary | ICD-10-CM

## 2015-11-20 DIAGNOSIS — Z8673 Personal history of transient ischemic attack (TIA), and cerebral infarction without residual deficits: Secondary | ICD-10-CM

## 2015-11-20 DIAGNOSIS — I4891 Unspecified atrial fibrillation: Secondary | ICD-10-CM

## 2015-11-20 LAB — MRSA PCR SCREENING: MRSA by PCR: NEGATIVE

## 2015-11-20 LAB — CBC WITH DIFFERENTIAL/PLATELET
BASOS PCT: 0 %
Basophils Absolute: 0 10*3/uL (ref 0.0–0.1)
Eosinophils Absolute: 0 10*3/uL (ref 0.0–0.7)
Eosinophils Relative: 0 %
HEMATOCRIT: 34.3 % — AB (ref 36.0–46.0)
Hemoglobin: 10.7 g/dL — ABNORMAL LOW (ref 12.0–15.0)
LYMPHS ABS: 0.6 10*3/uL — AB (ref 0.7–4.0)
Lymphocytes Relative: 8 %
MCH: 27.8 pg (ref 26.0–34.0)
MCHC: 31.2 g/dL (ref 30.0–36.0)
MCV: 89.1 fL (ref 78.0–100.0)
MONO ABS: 0.4 10*3/uL (ref 0.1–1.0)
MONOS PCT: 6 %
NEUTROS ABS: 6 10*3/uL (ref 1.7–7.7)
Neutrophils Relative %: 86 %
Platelets: 112 10*3/uL — ABNORMAL LOW (ref 150–400)
RBC: 3.85 MIL/uL — ABNORMAL LOW (ref 3.87–5.11)
RDW: 15 % (ref 11.5–15.5)
WBC: 7 10*3/uL (ref 4.0–10.5)

## 2015-11-20 LAB — BRAIN NATRIURETIC PEPTIDE: B NATRIURETIC PEPTIDE 5: 423.6 pg/mL — AB (ref 0.0–100.0)

## 2015-11-20 LAB — COMPREHENSIVE METABOLIC PANEL
ALBUMIN: 2.9 g/dL — AB (ref 3.5–5.0)
ALK PHOS: 52 U/L (ref 38–126)
ALT: 17 U/L (ref 14–54)
ANION GAP: 9 (ref 5–15)
AST: 26 U/L (ref 15–41)
BUN: 12 mg/dL (ref 6–20)
CALCIUM: 8.3 mg/dL — AB (ref 8.9–10.3)
CHLORIDE: 108 mmol/L (ref 101–111)
CO2: 22 mmol/L (ref 22–32)
CREATININE: 0.96 mg/dL (ref 0.44–1.00)
GFR calc Af Amer: >60 mL/min (ref >60)
GFR calc non Af Amer: 52 mL/min — ABNORMAL LOW (ref >60)
GLUCOSE: 94 mg/dL (ref 65–99)
Potassium: 3.7 mmol/L (ref 3.5–5.1)
SODIUM: 139 mmol/L (ref 135–145)
Total Bilirubin: 0.9 mg/dL (ref 0.3–1.2)
Total Protein: 5.7 g/dL — ABNORMAL LOW (ref 6.5–8.1)

## 2015-11-20 LAB — HIV ANTIBODY (ROUTINE TESTING W REFLEX): HIV SCREEN 4TH GENERATION: NONREACTIVE

## 2015-11-20 LAB — URINE CULTURE: Culture: NO GROWTH

## 2015-11-20 MED ORDER — FUROSEMIDE 10 MG/ML IJ SOLN
20.0000 mg | Freq: Two times a day (BID) | INTRAMUSCULAR | Status: DC
  Administered 2015-11-20 (×2): 20 mg via INTRAVENOUS
  Filled 2015-11-20 (×2): qty 2

## 2015-11-20 MED ORDER — CEFTRIAXONE SODIUM 2 G IJ SOLR
2.0000 g | INTRAMUSCULAR | Status: DC
  Administered 2015-11-20 – 2015-11-22 (×2): 2 g via INTRAVENOUS
  Filled 2015-11-20 (×4): qty 2

## 2015-11-20 MED ORDER — POTASSIUM CHLORIDE CRYS ER 20 MEQ PO TBCR
20.0000 meq | EXTENDED_RELEASE_TABLET | Freq: Every day | ORAL | Status: DC
  Administered 2015-11-20 – 2015-11-21 (×2): 20 meq via ORAL
  Filled 2015-11-20 (×2): qty 1

## 2015-11-20 NOTE — Progress Notes (Signed)
PROGRESS NOTE    [name]  [id number] DOB: 11/30/1927 DOA: 11/19/2015 PCP: [name], MD    Outpatient Specialists: None  Brief Narrative:   82 year old female with a history of GERD, TIA, PVD, hyperlipidemia, COPD, hypertension, DVT, CVA lymphoma who came to the hospital with sepsis, hypotension. Patient initially was thought to have pneumonia and started on ceftriaxone and Zithromax.repeat chest x-ray PA and lateral this morning does not show pneumonia. BNP checked this morning showed 423.6. Echocardiogram has been ordered. Patient started on IV Lasix 20 mg every 12 hours.Patient also found to have erythema and tenderness of right lower extremity. Patient currently on ceftriaxone for cellulitis.  Assessment & Plan:   Principal Problem:   Sepsis due to pneumonia [facility]) Active Problems:   Hyperlipidemia   Essential hypertension   Atrial fibrillation (HCC)   COPD (chronic obstructive pulmonary disease) (HCC)   History of Large B-cell lymphoma (HCC)   CKD (chronic kidney disease), stage III   Laryngeal carcinoma (HCC)   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 ([place])   History of cardioembolic stroke   Acute respiratory failure with hypoxia (HCC)  Streptococcus group G bacteremia- patient blood cultures one out of 2 bottles growing group G strep, continue with ceftriaxone. Will await final culture and sensitivity.  Right lower extremity cellulitis- likely source of infection, continue with ceftriaxone. Will follow  Dyspnea on exertion- patient has long-standing history of dyspnea on exertion, will check echocardiogram, BNP elevated for 423.6.er previous echo from January 2016 showed left ventricle dilation, moderate mitral regurgitation left atrial dilation. We'll start Lasix 20 mg IV every 12 hours. Foley catheter inserted for accurate measurement of intake and output.  Hypertension-continue Zebeta  5 mg by mouth daily.  Atrial fibrillation-  chads vasc score is 6,heart rate is controlled, continue Zebeta. Continue anticoagulation with Xarelto.  History of large B-cell lymphoma/range of carcinoma- Last evaluated by oncology September 2016 with determination that she is in clinical remission from non-Hodgkin's lymphoma with plans to return for office visit in 8 months  History of simple partial seizures- stable, continue Keppra  History of cardio embolic stroke- continue anticoagulation with Xarelto   DVT prophylaxis: Patient on Xarelto Code Status: full code Family Communication: discussed with patient's daughter at bedside Disposition Plan: pending improvement in respiratory status and right lower extremity cellulitis   Consultants:   none  Procedures:  Foley catheter     Antimicrobials:   Ceftriaxone- 11/19/2015 to present  Zithromax-  11/19/2015 to 11/20/2015   Subjective: This morning patient denies shortness of breath. No chest pain. Patient has redness noted in the right lower extremity.  Objective: Filed Vitals:   11/20/15 0200 11/20/15 0400 11/20/15 0600 11/20/15 0800  BP: 120/53 112/67 131/67 147/63  Pulse: 80 80 84   Temp:  98.6 F (37 C)  98.2 F (36.8 C)  TempSrc:  Oral  Oral  Resp:   20 20  Height:      Weight:      SpO2: 88% 93% 94% 94%    Intake/Output Summary (Last 24 hours) at 11/20/15 1139 Last data filed at 11/20/15 1027  Gross per 24 hour  Intake   2450 ml  Output   2325 ml  Net    125 ml   Filed Weights   11/19/15 0635 11/19/15 2048  Weight: 86.183 kg (190 lb) 90.9 kg (200 lb 6.4 oz)    Examination:  General exam: Appears calm and comfortable  Respiratory system: Clear to auscultation. Respiratory effort  normal. Cardiovascular system: S1 & S2 heard, RRR. No JVD, murmurs, rubs, gallops or clicks.  Gastrointestinal system: Abdomen is nondistended, soft and nontender. No organomegaly or masses felt. Normal bowel sounds heard. Central nervous system: Alert and oriented.  No focal neurological deficits. Extremities: Symmetric 5 x 5 power. Skin: Erythema noted in the right lower extremity, warm to touch, tender to palpation. Psychiatry: Judgement and insight appear normal. Mood & affect appropriate.     Data Reviewed: I have personally reviewed following labs and imaging studies  CBC:  Recent Labs Lab 11/19/15 0650 11/20/15 0504  WBC 8.4 7.0  NEUTROABS 6.9 6.0  HGB 12.2 10.7*  HCT 40.5 34.3*  MCV 88.6 89.1  PLT 144* [id number]*   Basic Metabolic Panel:  Recent Labs Lab 11/19/15 0650 11/20/15 0504  NA 142 139  K 4.5 3.7  CL 103 108  CO2 27 22  GLUCOSE 125* 94  BUN 12 12  CREATININE 1.13* 0.96  CALCIUM 9.4 8.3*   GFR: Estimated Creatinine Clearance: 46.8 mL/min (by C-G formula based on Cr of 0.96). Liver Function Tests:  Recent Labs Lab 11/19/15 0650 11/20/15 0504  AST 20 26  ALT 16 17  ALKPHOS 72 52  BILITOT 0.9 0.9  PROT 7.1 5.7*  ALBUMIN 3.9 2.9*   Cardiac Enzymes:  Recent Labs Lab 11/19/15 0650  TROPONINI <0.03   Urine analysis:    Component Value Date/Time   COLORURINE YELLOW 11/19/2015 0723   APPEARANCEUR CLEAR 11/19/2015 0723   LABSPEC 1.011 11/19/2015 0723   PHURINE 7.5 11/19/2015 0723   GLUCOSEU NEGATIVE 11/19/2015 0723   HGBUR NEGATIVE 11/19/2015 0723   HGBUR trace-lysed 02/13/2010 0830   BILIRUBINUR NEGATIVE 11/19/2015 0723   BILIRUBINUR neg 12/23/2013 1221   KETONESUR NEGATIVE 11/19/2015 0723   PROTEINUR NEGATIVE 11/19/2015 0723   PROTEINUR neg 12/23/2013 1221   UROBILINOGEN 0.2 08/05/2014 1715   UROBILINOGEN 0.2 12/23/2013 1221   NITRITE NEGATIVE 11/19/2015 0723   NITRITE neg 12/23/2013 1221   LEUKOCYTESUR NEGATIVE 11/19/2015 0723   Sepsis Labs: @LABRCNTIP (procalcitonin:4,lacticidven:4)  ) Recent Results (from the past 240 hour(s))  Blood culture (routine x 2)     Status: Abnormal (Preliminary result)   Collection Time: 11/19/15  7:04 AM  Result Value Ref Range Status   Specimen Description  BLOOD RIGHT FOREARM  Final   Special Requests BOTTLES DRAWN AEROBIC AND ANAEROBIC 5CC  Final   Culture  Setup Time   Final    GRAM POSITIVE COCCI IN CHAINS ANAEROBIC BOTTLE ONLY CRITICAL RESULT CALLED TO, READ BACK BY AND VERIFIED WITH: LINDSEY BISHOP,RN AT 1945 ON 11/19/15 BY W Quraishi    Culture (A)  Final    STREPTOCOCCUS GROUP G SUSCEPTIBILITIES TO FOLLOW    Report Status PENDING  Incomplete  Urine culture     Status: None   Collection Time: 11/19/15  7:23 AM  Result Value Ref Range Status   Specimen Description URINE, CATHETERIZED  Final   Special Requests NONE  Final   Culture NO GROWTH 1 DAY  Final   Report Status 11/20/2015 FINAL  Final  MRSA PCR Screening     Status: None   Collection Time: 11/19/15 10:46 PM  Result Value Ref Range Status   MRSA by PCR NEGATIVE NEGATIVE Final    Comment:        The GeneXpert MRSA Assay (FDA approved for NASAL specimens only), is one component of a comprehensive MRSA colonization surveillance program. It is not intended to diagnose MRSA infection nor to guide or  monitor treatment for MRSA infections.          Radiology Studies: Ct Abdomen Pelvis Wo Contrast  11/19/2015  CLINICAL DATA:  Altered mental status with fever. Possible free intraperitoneal air on chest radiographs. History of B-cell lymphoma. EXAM: CT ABDOMEN AND PELVIS WITHOUT CONTRAST TECHNIQUE: Multidetector CT imaging of the abdomen and pelvis was performed following the standard protocol without IV contrast. COMPARISON:  PET-CT 06/10/2012.  Abdominal pelvic CT 01/14/2012. FINDINGS: Examination is mildly motion degraded. Lower chest: Trace pleural effusions with increased dependent opacities at both lung bases, probably atelectasis. No evidence of basilar pneumothorax. The heart is enlarged. There is atherosclerosis of the aorta and coronary arteries. Hepatobiliary: The liver demonstrates mildly decreased density consistent with steatosis. No focal lesions are identified  on noncontrast imaging. There is a 2 cm peripherally calcified gallstone which appears unchanged. No evidence of gallbladder wall thickening or biliary dilatation. Pancreas: Fatty replacement without focal abnormality or surrounding inflammation. Spleen: Normal in size without focal abnormality. Adrenals/Urinary Tract: Both adrenal glands appear normal. Bilateral renal calcifications appear vascular. No definite urinary tract calculus or hydronephrosis. There is mild renal cortical thinning bilaterally. The bladder appears unremarkable. Stomach/Bowel: No evidence of bowel wall thickening, distention or surrounding inflammatory change. The sigmoid colon anastomosis appears normal. Vascular/Lymphatic: The previously demonstrated retroperitoneal and pelvic adenopathy has substantially improved. The largest remaining node measures 11 mm short axis in the left periaortic region (image 33). Extensive aortic and branch vessel atherosclerosis again noted. Reproductive: The uterus and ovaries appear normal. No evidence of adnexal mass. Other: No evidence of free intraperitoneal air, ascites or peritoneal nodularity. Musculoskeletal: No acute or significant osseous findings. There is progressive degenerative disc disease at L4-5 and L5-S1. A broad-based central disc protrusion at L4-5 contributes to severe multifactorial spinal stenosis. IMPRESSION: 1. No acute abdominal pelvic findings demonstrated. No evidence of pneumoperitoneum. 2. Increased dependent opacities at both lung bases, probably atelectasis. 3. Diffuse atherosclerosis. 4. Improvement in previously demonstrated adenopathy consistent with treated lymphoma. 5. Progressive disc degeneration in the lower lumbar spine contributing to severe multifactorial spinal stenosis at L4-5. 6. Cholelithiasis. Electronically Signed   By: Richardean Sale M.D.   On: 11/19/2015 08:37   Dg Chest 2 View  11/20/2015  CLINICAL DATA:  Community-acquired pneumonia. EXAM: CHEST  2 VIEW  COMPARISON:  11/19/2015 FINDINGS: Cardiac enlargement without heart failure COPD Left lower lobe consolidation unchanged and may represent atelectasis or pneumonia. Mild improvement in right lower lobe atelectasis since the prior study. Small left pleural effusion on the lateral view. IMPRESSION: Left lower lobe atelectasis/ infiltrate unchanged with a small left effusion Improvement in right lower lobe atelectasis. Electronically Signed   By: Franchot Gallo M.D.   On: 11/20/2015 07:47   Dg Chest Portable 1 View  11/19/2015  CLINICAL DATA:  Fever and altered mental status EXAM: PORTABLE CHEST 1 VIEW COMPARISON:  06/26/2013 FINDINGS: Patchy lung opacity at bases. Chronic mild cardiomegaly with aortic tortuosity. No effusion or pneumothorax. Symmetric biapical pleural thickening that is stable. Unexpected and unexplained lucency projecting below the right diaphragm. This could be artifact from overlying interfaces, but would exclude pneumoperitoneum with dedicated imaging - upright KUB or noncontrast abdominal CT depending on suspicion. These results were called by telephone at the time of interpretation on 11/19/2015 at 7:13 am to Dr. Jola Schmidt , who verbally acknowledged these results. IMPRESSION: 1. Basilar lung opacities primarily concerning for bronchopneumonia or aspiration in this clinical setting. 2. Unexpected lucency below the right diaphragm, as above. Would  obtain dedicated abdominal imaging to exclude pneumoperitoneum. Electronically Signed   By: Monte Fantasia M.D.   On: 11/19/2015 07:14        Scheduled Meds: . atorvastatin  20 mg Oral Daily  . bisoprolol  5 mg Oral Daily  . cefTRIAXone (ROCEPHIN)  IV  1 g Intravenous Q24H  . furosemide  20 mg Intravenous Q12H  . gabapentin  100 mg Oral TID  . guaiFENesin  1,200 mg Oral BID  . levETIRAcetam  750 mg Oral BID  . pregabalin  75 mg Oral BID  . Rivaroxaban  15 mg Oral Daily   Continuous Infusions: . sodium chloride 10 mL/hr (11/20/15  0745)     LOS: 1 day    Time spent: 25 min    Ryland Tungate S, MD [facility] Pager [phone]  If 7PM-7AM, please contact night-coverage [email or web address] Password [email or web address] 11/20/2015, 11:39 AM

## 2015-11-20 NOTE — Clinical Documentation Improvement (Signed)
Internal Medicine  Can the diagnosis of Atrial Fibrillation be further specified? Please document response in next progress note. Thank you!   Chronic Atrial fibrillation  Paroxysmal Atrial fibrillation  Permanent Atrial fibrillation  Persistent Atrial fibrillation  Other  Clinically Undetermined  Document any associated diagnoses/conditions.  Supporting Information:  Being treated with PO Xarelto 15 mg once daily  Please exercise your independent, professional judgment when responding. A specific answer is not anticipated or expected.  Thank You,  Zoila Shutter RN, BSN, [name] [phone]; Cell: [phone]

## 2015-11-21 ENCOUNTER — Inpatient Hospital Stay (HOSPITAL_COMMUNITY): Payer: Medicare Other

## 2015-11-21 DIAGNOSIS — N183 Chronic kidney disease, stage 3 (moderate): Secondary | ICD-10-CM

## 2015-11-21 DIAGNOSIS — M7989 Other specified soft tissue disorders: Secondary | ICD-10-CM

## 2015-11-21 DIAGNOSIS — A419 Sepsis, unspecified organism: Secondary | ICD-10-CM

## 2015-11-21 DIAGNOSIS — B955 Unspecified streptococcus as the cause of diseases classified elsewhere: Secondary | ICD-10-CM

## 2015-11-21 DIAGNOSIS — J189 Pneumonia, unspecified organism: Secondary | ICD-10-CM

## 2015-11-21 DIAGNOSIS — R7881 Bacteremia: Secondary | ICD-10-CM

## 2015-11-21 LAB — BASIC METABOLIC PANEL
Anion gap: 11 (ref 5–15)
BUN: 14 mg/dL (ref 6–20)
CHLORIDE: 103 mmol/L (ref 101–111)
CO2: 28 mmol/L (ref 22–32)
Calcium: 8.7 mg/dL — ABNORMAL LOW (ref 8.9–10.3)
Creatinine, Ser: 1.15 mg/dL — ABNORMAL HIGH (ref 0.44–1.00)
GFR calc Af Amer: 49 mL/min — ABNORMAL LOW (ref >60)
GFR calc non Af Amer: 42 mL/min — ABNORMAL LOW (ref >60)
Glucose, Bld: 104 mg/dL — ABNORMAL HIGH (ref 65–99)
POTASSIUM: 4.2 mmol/L (ref 3.5–5.1)
Sodium: 142 mmol/L (ref 135–145)

## 2015-11-21 LAB — CULTURE, BLOOD (ROUTINE X 2)

## 2015-11-21 LAB — PROCALCITONIN: Procalcitonin: 17.83 ng/mL

## 2015-11-21 MED ORDER — HALOPERIDOL LACTATE 5 MG/ML IJ SOLN
5.0000 mg | Freq: Once | INTRAMUSCULAR | Status: AC
  Administered 2015-11-22: 5 mg via INTRAVENOUS
  Filled 2015-11-21: qty 1

## 2015-11-21 MED ORDER — FUROSEMIDE 40 MG PO TABS
20.0000 mg | ORAL_TABLET | Freq: Two times a day (BID) | ORAL | Status: DC
  Administered 2015-11-21 (×2): 20 mg via ORAL
  Filled 2015-11-21 (×2): qty 1

## 2015-11-21 MED ORDER — GI COCKTAIL ~~LOC~~
30.0000 mL | Freq: Once | ORAL | Status: AC
  Administered 2015-11-21: 30 mL via ORAL
  Filled 2015-11-21: qty 30

## 2015-11-21 NOTE — Progress Notes (Signed)
Pt rang call bell complaining of chest pain. Upon assessment pt complained of gas type pain mid sternally that she stated wouldn't move. Pt stated that pain did not radiate or move to any other places. Pts vital signs were WNL. Pt rated pain as 0/10, just stated that it was uncomfortable. Wrtier did EKG, while doing EKG pt stated that pain had resolved itself and upon more questioning stated that pain felt like indigestion she has had in the past. NP Schorr was notified and order for GI cocktail was given. Pt stated that the GI cocktail helped take away any pain

## 2015-11-21 NOTE — Progress Notes (Signed)
VASCULAR LAB PRELIMINARY  PRELIMINARY  PRELIMINARY  PRELIMINARY  Right lower extremity venous duplex completed.    No evidence of DVT in right leg,no superificial thrombus or baker's cyst.  Janifer Adie, RVT, RDMS 11/21/2015, 8:47 AM

## 2015-11-21 NOTE — Progress Notes (Signed)
PROGRESS NOTE    [name]  [id number] DOB: 18-Aug-1927 DOA: 11/19/2015 PCP: [name], MD    Outpatient Specialists: None  Brief Narrative:   82 year old female with a history of GERD, TIA, PVD, hyperlipidemia, COPD, hypertension, DVT, CVA lymphoma who came to the hospital with sepsis, hypotension. Patient initially was thought to have pneumonia and started on ceftriaxone and Zithromax.repeat chest x-ray PA and lateral this morning does not show pneumonia. BNP checked this morning showed 423.6. Echocardiogram has been ordered. Patient started on IV Lasix 20 mg every 12 hours.Patient also found to have erythema and tenderness of right lower extremity. Patient currently on ceftriaxone for cellulitis.  Assessment & Plan:   Principal Problem:   Sepsis due to pneumonia [facility]) Active Problems:   Hyperlipidemia   Essential hypertension   Atrial fibrillation (HCC)   COPD (chronic obstructive pulmonary disease) (HCC)   History of Large B-cell lymphoma (HCC)   CKD (chronic kidney disease), stage III   Laryngeal carcinoma (HCC)   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 ([place])   History of cardioembolic stroke   Acute respiratory failure with hypoxia (HCC)   Cellulitis of leg, right  Streptococcus group G bacteremia- patient blood cultures one out of 2 bottles growing group G strep, continue with ceftriaxone. Will await final culture and sensitivity.  Right lower extremity cellulitis- likely source of infection, continue with ceftriaxone. Will follow  Dyspnea on exertion/acute diastolic heart failure- patient has long-standing history of dyspnea on exertion,  Echocardiogram is pending, BNP elevated for 423.6.er previous echo from January 2016 showed left ventricle dilation, moderate mitral regurgitation left atrial dilation. Excellent response to Lasix 20 mg IV every 12 hours. Foley catheter inserted for accurate measurement of intake and  output.  Hypertension-continue Zebeta  5 mg by mouth daily.  Atrial fibrillation- chads vasc score is 6,heart rate is controlled, continue Zebeta. Continue anticoagulation with Xarelto.  History of large B-cell lymphoma/range of carcinoma- Last evaluated by oncology September 2016 with determination that she is in clinical remission from non-Hodgkin's lymphoma with plans to return for office visit in 8 months  History of simple partial seizures- stable, continue Keppra  History of cardio embolic stroke- continue anticoagulation with Xarelto   DVT prophylaxis: Patient on Xarelto Code Status: full code Family Communication: discussed with patient's daughter at bedside Disposition Plan: pending improvement in respiratory status and right lower extremity cellulitis   Consultants:   none  Procedures:  Foley catheter     Antimicrobials:   Ceftriaxone- 11/19/2015 to present  Zithromax-  11/19/2015 to 11/20/2015   Subjective: This morning patient is breathing much better.Had almost 5 L urine output yesterday  Objective: Filed Vitals:   11/21/15 0000 11/21/15 0058 11/21/15 0339 11/21/15 0900  BP: 132/56  143/69 137/67  Pulse: 65  74 80  Temp:  98.2 F (36.8 C) 98.4 F (36.9 C) 98.4 F (36.9 C)  TempSrc:  Oral Oral Oral  Resp: 19   23  Height:      Weight:      SpO2: 93%  95% 94%    Intake/Output Summary (Last 24 hours) at 11/21/15 1106 Last data filed at 11/21/15 0348  Gross per 24 hour  Intake    760 ml  Output   2450 ml  Net  -1690 ml   Filed Weights   11/19/15 0635 11/19/15 2048  Weight: 86.183 kg (190 lb) 90.9 kg (200 lb 6.4 oz)    Examination:  General exam: Appears calm and comfortable  Respiratory system: Clear to auscultation. Respiratory effort normal. Cardiovascular system: S1 & S2 heard, RRR. No JVD, murmurs, rubs, gallops or clicks.  Gastrointestinal system: Abdomen is nondistended, soft and nontender. No organomegaly or masses felt. Normal  bowel sounds heard. Central nervous system: Alert and oriented. No focal neurological deficits. Extremities: Symmetric 5 x 5 power. Skin: Erythema noted in the right lower extremity, warm to touch, tender to palpation, better than yesterday. Psychiatry: Judgement and insight appear normal. Mood & affect appropriate.     Data Reviewed: I have personally reviewed following labs and imaging studies  CBC:  Recent Labs Lab 11/19/15 0650 11/20/15 0504  WBC 8.4 7.0  NEUTROABS 6.9 6.0  HGB 12.2 10.7*  HCT 40.5 34.3*  MCV 88.6 89.1  PLT 144* [id number]*   Basic Metabolic Panel:  Recent Labs Lab 11/19/15 0650 11/20/15 0504 11/21/15 0408  NA 142 139 142  K 4.5 3.7 4.2  CL 103 108 103  CO2 27 22 28   GLUCOSE 125* 94 104*  BUN 12 12 14   CREATININE 1.13* 0.96 1.15*  CALCIUM 9.4 8.3* 8.7*   GFR: Estimated Creatinine Clearance: 39.1 mL/min (by C-G formula based on Cr of 1.15). Liver Function Tests:  Recent Labs Lab 11/19/15 0650 11/20/15 0504  AST 20 26  ALT 16 17  ALKPHOS 72 52  BILITOT 0.9 0.9  PROT 7.1 5.7*  ALBUMIN 3.9 2.9*   Cardiac Enzymes:  Recent Labs Lab 11/19/15 0650  TROPONINI <0.03   Urine analysis:    Component Value Date/Time   COLORURINE YELLOW 11/19/2015 0723   APPEARANCEUR CLEAR 11/19/2015 0723   LABSPEC 1.011 11/19/2015 0723   PHURINE 7.5 11/19/2015 0723   GLUCOSEU NEGATIVE 11/19/2015 0723   HGBUR NEGATIVE 11/19/2015 0723   HGBUR trace-lysed 02/13/2010 0830   BILIRUBINUR NEGATIVE 11/19/2015 0723   BILIRUBINUR neg 12/23/2013 1221   KETONESUR NEGATIVE 11/19/2015 0723   PROTEINUR NEGATIVE 11/19/2015 0723   PROTEINUR neg 12/23/2013 1221   UROBILINOGEN 0.2 08/05/2014 1715   UROBILINOGEN 0.2 12/23/2013 1221   NITRITE NEGATIVE 11/19/2015 0723   NITRITE neg 12/23/2013 1221   LEUKOCYTESUR NEGATIVE 11/19/2015 0723   Sepsis Labs: @LABRCNTIP (procalcitonin:4,lacticidven:4)  ) Recent Results (from the past 240 hour(s))  Blood culture (routine x 2)      Status: Abnormal (Preliminary result)   Collection Time: 11/19/15  7:04 AM  Result Value Ref Range Status   Specimen Description BLOOD RIGHT FOREARM  Final   Special Requests BOTTLES DRAWN AEROBIC AND ANAEROBIC 5CC  Final   Culture  Setup Time   Final    GRAM POSITIVE COCCI IN CHAINS ANAEROBIC BOTTLE ONLY CRITICAL RESULT CALLED TO, READ BACK BY AND VERIFIED WITH: LINDSEY BISHOP,RN AT 1945 ON 11/19/15 BY W Cicio    Culture (A)  Final    STREPTOCOCCUS GROUP G SUSCEPTIBILITIES TO FOLLOW    Report Status PENDING  Incomplete  Blood culture (routine x 2)     Status: None (Preliminary result)   Collection Time: 11/19/15  7:16 AM  Result Value Ref Range Status   Specimen Description BLOOD LEFT ANTECUBITAL  Final   Special Requests BOTTLES DRAWN AEROBIC ONLY [place]  Final   Culture NO GROWTH 1 DAY  Final   Report Status PENDING  Incomplete  Urine culture     Status: None   Collection Time: 11/19/15  7:23 AM  Result Value Ref Range Status   Specimen Description URINE, CATHETERIZED  Final   Special Requests NONE  Final   Culture NO GROWTH 1 DAY  Final   Report Status 11/20/2015 FINAL  Final  MRSA PCR Screening     Status: None   Collection Time: 11/19/15 10:46 PM  Result Value Ref Range Status   MRSA by PCR NEGATIVE NEGATIVE Final    Comment:        The GeneXpert MRSA Assay (FDA approved for NASAL specimens only), is one component of a comprehensive MRSA colonization surveillance program. It is not intended to diagnose MRSA infection nor to guide or monitor treatment for MRSA infections.          Radiology Studies: Dg Chest 2 View  11/20/2015  CLINICAL DATA:  Community-acquired pneumonia. EXAM: CHEST  2 VIEW COMPARISON:  11/19/2015 FINDINGS: Cardiac enlargement without heart failure COPD Left lower lobe consolidation unchanged and may represent atelectasis or pneumonia. Mild improvement in right lower lobe atelectasis since the prior study. Small left pleural effusion on the  lateral view. IMPRESSION: Left lower lobe atelectasis/ infiltrate unchanged with a small left effusion Improvement in right lower lobe atelectasis. Electronically Signed   By: Franchot Gallo M.D.   On: 11/20/2015 07:47        Scheduled Meds: . atorvastatin  20 mg Oral Daily  . bisoprolol  5 mg Oral Daily  . cefTRIAXone (ROCEPHIN)  IV  2 g Intravenous Q24H  . furosemide  20 mg Oral BID  . gabapentin  100 mg Oral TID  . guaiFENesin  1,200 mg Oral BID  . levETIRAcetam  750 mg Oral BID  . potassium chloride  20 mEq Oral Daily  . pregabalin  75 mg Oral BID  . Rivaroxaban  15 mg Oral Daily   Continuous Infusions: . sodium chloride Stopped (11/20/15 1300)     LOS: 2 days    Time spent: 25 min    Domitila Stetler S, MD [facility] Pager [phone]  If 7PM-7AM, please contact night-coverage [email or web address] Password [email or web address] 11/21/2015, 11:06 AM

## 2015-11-22 ENCOUNTER — Other Ambulatory Visit (HOSPITAL_COMMUNITY): Payer: Medicare Other

## 2015-11-22 DIAGNOSIS — B954 Other streptococcus as the cause of diseases classified elsewhere: Secondary | ICD-10-CM

## 2015-11-22 DIAGNOSIS — R7881 Bacteremia: Secondary | ICD-10-CM

## 2015-11-22 LAB — BASIC METABOLIC PANEL
ANION GAP: 12 (ref 5–15)
Anion gap: 12 (ref 5–15)
BUN: 18 mg/dL (ref 6–20)
BUN: 18 mg/dL (ref 6–20)
CALCIUM: 9.1 mg/dL (ref 8.9–10.3)
CHLORIDE: 103 mmol/L (ref 101–111)
CO2: 24 mmol/L (ref 22–32)
CO2: 29 mmol/L (ref 22–32)
CREATININE: 1.25 mg/dL — AB (ref 0.44–1.00)
Calcium: 8.6 mg/dL — ABNORMAL LOW (ref 8.9–10.3)
Chloride: 102 mmol/L (ref 101–111)
Creatinine, Ser: 1.34 mg/dL — ABNORMAL HIGH (ref 0.44–1.00)
GFR calc Af Amer: 44 mL/min — ABNORMAL LOW (ref >60)
GFR calc non Af Amer: 38 mL/min — ABNORMAL LOW (ref >60)
GFR, EST AFRICAN AMERICAN: 41 mL/min — AB (ref >60)
GFR, EST NON AFRICAN AMERICAN: 35 mL/min — AB (ref >60)
GLUCOSE: 110 mg/dL — AB (ref 65–99)
Glucose, Bld: 100 mg/dL — ABNORMAL HIGH (ref 65–99)
POTASSIUM: 5.6 mmol/L — AB (ref 3.5–5.1)
POTASSIUM: 4 mmol/L (ref 3.5–5.1)
Sodium: 139 mmol/L (ref 135–145)
Sodium: 143 mmol/L (ref 135–145)

## 2015-11-22 MED ORDER — SODIUM POLYSTYRENE SULFONATE 15 GM/60ML PO SUSP
30.0000 g | Freq: Once | ORAL | Status: AC
  Administered 2015-11-22: 30 g via ORAL
  Filled 2015-11-22: qty 120

## 2015-11-22 MED ORDER — FUROSEMIDE 20 MG PO TABS: 20.0000 mg | ORAL_TABLET | Freq: Every day | ORAL | Status: DC

## 2015-11-22 MED ORDER — CEPHALEXIN 500 MG PO CAPS: 500.0000 mg | ORAL_CAPSULE | Freq: Two times a day (BID) | ORAL | Status: DC

## 2015-11-22 NOTE — Progress Notes (Signed)
SATURATION QUALIFICATIONS: (This note is used to comply with regulatory documentation for home oxygen)  Patient Saturations on Room Air at Rest = 94%  Patient Saturations on Room Air while Ambulating = 86%  Patient Saturations on 3 Liters of oxygen while Ambulating = 90%  Please briefly explain why patient needs home oxygen: to maintain O2 levels while mobilizing/walking   11/22/2015 Barry Brunner, PT Pager: [phone]

## 2015-11-22 NOTE — Discharge Instructions (Signed)

## 2015-11-22 NOTE — Care Management Note (Signed)
Case Management Note  Patient Details  Name: GOLDYE LEHENBAUER MRN: [id number] Date of Birth: 05-08-1928  Subjective/Objective:      Pt will need home O2 and home health PT when discharged.  Provided list of home health agencies to spouse and referral made to [place] per his choice.                          Expected Discharge Plan:  [place]  Discharge planning Services  CM Consult  Post Acute Care Choice:  Durable Medical Equipment, Home Health Choice offered to:  Spouse  DME Arranged:  Oxygen DME Agency:  [place]:  PT [facility] Agency:  [place]  Status of Service:  Completed, signed off  Medicare Important Message Given:  Yes  Girard Cooter, RN 11/22/2015, 11:51 AM

## 2015-11-22 NOTE — Discharge Summary (Signed)
Physician Discharge Summary  [name] [id number] DOB: 11-04-1927 DOA: 11/19/2015  PCP: Lisa Reddish, MD  Admit date: 11/19/2015 Discharge date: 11/22/2015  Time spent:25* minutes  Recommendations for Outpatient Follow-up:  1. 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 female with medical history significant of COPD (not on oxygen), hypertension, atrial fibrillation on Xarelto, stage III chronic kidney disease, dyslipidemia, history of large B-cell lymphoma as well as laryngeal carcinoma followed by oncology as an outpatient, history of focal epilepsy with simple partial seizures, history of cardioembolic stroke and peripheral neuropathy with known spinal stenosis and neurogenic claudication. The patient presented to the ER from home after her husband noticed she was shaking diffusely this morning and complaining of feeling cold. Upon EMS arrival to the home patient was found to be hypoxemic with O2 saturations in the 60s and was transported to the ER on a nonrebreather mask. Of note patient was diagnosed with a viral upper respiratory infection on 3/27 by her PCP. She reports that she did not get better after that visit and continued with coughing with  green to yellow productive sputum. She's had diarrhea for the past 3-4 days. She reports poor oral intake. She also reports left upper back pain worse with coughing. She denied photophobia or neck pain or rigidity  Hospital Course:   Streptococcus group G bacteremia- patient blood cultures one out of 2 bottles growing group G strep, continue with ceftriaxone. Culture growing back as pan sensitive to beta lactams, I called and discussed with Dr Megan Salon, who recommends to discharge on Keflex. She has received antibiotics for four days in the hospital, will continue with keflex 500 mg po BID for five more days.  Right lower extremity cellulitis- much improved,  likely source of infection, was started on ceftriaxone, will change the antibiotics to keflex as above.  Dyspnea on exertion/acute diastolic heart failure- patient has long-standing history of dyspnea on exertion, Echocardiogram is pending, BNP elevated for 423.6.er previous echo from January 2016 showed left ventricle dilation, moderate mitral regurgitation left atrial dilation. Excellent response to Lasix 20 mg IV every 12 hours. Foley catheter inserted for accurate measurement of intake and output. Will cut down the dose of lasix to 20 mg po daily. Patient to go home on home oxygen.  Hypertension-continue Zebeta 5 mg by mouth daily.  Atrial fibrillation- chads vasc score is 6,heart rate is controlled, continue Zebeta. Continue anticoagulation with Xarelto.  History of large B-cell lymphoma/range of carcinoma- Last evaluated by oncology September 2016 with determination that she is in clinical remission from non-Hodgkin's lymphoma with plans to return for office visit in 8 months  History of simple partial seizures- stable, continue Keppra  History of cardio embolic stroke- continue anticoagulation  with Xarelto  Hyperkalemia- gave one dose of kayexalate in the hospital, repeat potassium is 4.0  Procedures: Echocardiogram    Consultations:  None   Discharge Exam: Filed Vitals:   11/22/15 0802 11/22/15 0814  BP: 103/51 103/51  Pulse: 69   Temp: 97.7 F (36.5 C)   Resp: 20     General: Appears in no acute distress Cardiovascular: S1S2 RRR Respiratory: Clear bilaterally  Discharge Instructions   Discharge Instructions    Diet - low sodium heart healthy    Complete by:  As directed      Increase activity slowly    Complete by:  As directed           Current Discharge Medication List    START taking these medications   Details  cephALEXin (KEFLEX) 500 MG capsule Take 1 capsule (500 mg total) by mouth 2 (two) times daily. Qty: 10 capsule, Refills: 0    furosemide (LASIX) 20 MG tablet Take 1 tablet (20 mg total) by mouth daily. Qty: 30 tablet, Refills: 2      CONTINUE these medications which have NOT CHANGED   Details  acetaminophen (TYLENOL) 650 MG CR tablet Take 650 mg by mouth every 8 (eight) hours as needed for pain. Reported on 10/28/2015    ALPRAZolam (XANAX) 0.25 MG tablet Take 1 tablet (0.25 mg total) by mouth once as needed for anxiety (before procedure). Qty: 15 tablet, Refills: 0    atorvastatin (LIPITOR) 20 MG tablet TAKE ONE TABLET BY MOUTH ONCE DAILY Qty: 30 tablet, Refills: 5    bisoprolol (ZEBETA) 5 MG tablet TAKE ONE TABLET BY MOUTH ONCE DAILY Qty: 30 tablet, Refills: 5    cholecalciferol (VITAMIN D) 1000 UNITS tablet Take 1,000 Units by mouth daily.    diclofenac sodium (VOLTAREN) 1 % GEL Apply 2 g topically 4 (four) times daily. Qty: 1 Tube, Refills: 1    gabapentin (NEURONTIN) 100 MG capsule Take 1 capsule (100 mg total) by mouth 3 (three) times daily. Qty: 90 capsule, Refills: 4    Glucosamine HCl (GLUCOSAMINE PO) Take 1 tablet by mouth daily.     levETIRAcetam (KEPPRA) 750 MG tablet TAKE ONE TABLET BY MOUTH TWICE DAILY Qty: 60 tablet, Refills: 5    loperamide (IMODIUM) 2 MG capsule Take 2 mg by mouth daily as needed for diarrhea or loose stools.      pregabalin (LYRICA) 75 MG capsule Take 75 mg by mouth 2 (two) times daily.    XARELTO 15 MG TABS tablet TAKE ONE TABLET BY MOUTH ONCE DAILY WITH  SUPPER Qty: 90 tablet, Refills: 3       Allergies  Allergen Reactions  . Ativan [Lorazepam] Other (See Comments)    Severe hallucinations   . Propoxyphene Hcl   . Codeine Itching  . Penicillins Rash  . Vancomycin Rash      The results of significant diagnostics from this hospitalization (including imaging, microbiology, ancillary and laboratory) are listed below for reference.    Significant Diagnostic Studies: Ct Abdomen Pelvis Wo Contrast  11/19/2015  CLINICAL DATA:  Altered mental status with fever. Possible free intraperitoneal air on chest radiographs. History of B-cell lymphoma. EXAM: CT ABDOMEN AND PELVIS WITHOUT CONTRAST TECHNIQUE: Multidetector CT imaging of the abdomen and pelvis was performed following the standard protocol without IV contrast. COMPARISON:  PET-CT 06/10/2012.  Abdominal pelvic CT 01/14/2012. FINDINGS: Examination is mildly motion degraded. Lower chest: Trace pleural effusions with increased dependent opacities at both lung bases, probably atelectasis. No evidence of  basilar pneumothorax. The heart is enlarged. There is atherosclerosis of the aorta and coronary arteries. Hepatobiliary: The liver demonstrates mildly decreased density consistent with steatosis. No focal lesions are identified on noncontrast imaging. There is a 2 cm peripherally calcified gallstone which appears unchanged. No evidence of gallbladder wall thickening or biliary dilatation. Pancreas: Fatty replacement without focal abnormality or surrounding inflammation. Spleen: Normal in size without focal abnormality. Adrenals/Urinary Tract: Both adrenal glands appear normal. Bilateral renal calcifications appear vascular. No definite urinary tract calculus or hydronephrosis. There is mild renal cortical thinning bilaterally. The bladder appears  unremarkable. Stomach/Bowel: No evidence of bowel wall thickening, distention or surrounding inflammatory change. The sigmoid colon anastomosis appears normal. Vascular/Lymphatic: The previously demonstrated retroperitoneal and pelvic adenopathy has substantially improved. The largest remaining node measures 11 mm short axis in the left periaortic region (image 33). Extensive aortic and branch vessel atherosclerosis again noted. Reproductive: The uterus and ovaries appear normal. No evidence of adnexal mass. Other: No evidence of free intraperitoneal air, ascites or peritoneal nodularity. Musculoskeletal: No acute or significant osseous findings. There is progressive degenerative disc disease at L4-5 and L5-S1. A broad-based central disc protrusion at L4-5 contributes to severe multifactorial spinal stenosis. IMPRESSION: 1. No acute abdominal pelvic findings demonstrated. No evidence of pneumoperitoneum. 2. Increased dependent opacities at both lung bases, probably atelectasis. 3. Diffuse atherosclerosis. 4. Improvement in previously demonstrated adenopathy consistent with treated lymphoma. 5. Progressive disc degeneration in the lower lumbar spine contributing to severe multifactorial spinal stenosis at L4-5. 6. Cholelithiasis. Electronically Signed   By: Richardean Sale M.D.   On: 11/19/2015 08:37   Dg Chest 2 View  11/20/2015  CLINICAL DATA:  Community-acquired pneumonia. EXAM: CHEST  2 VIEW COMPARISON:  11/19/2015 FINDINGS: Cardiac enlargement without heart failure COPD Left lower lobe consolidation unchanged and may represent atelectasis or pneumonia. Mild improvement in right lower lobe atelectasis since the prior study. Small left pleural effusion on the lateral view. IMPRESSION: Left lower lobe atelectasis/ infiltrate unchanged with a small left effusion Improvement in right lower lobe atelectasis. Electronically Signed   By: Franchot Gallo M.D.   On: 11/20/2015 07:47   Dg Chest Portable 1  View  11/19/2015  CLINICAL DATA:  Fever and altered mental status EXAM: PORTABLE CHEST 1 VIEW COMPARISON:  06/26/2013 FINDINGS: Patchy lung opacity at bases. Chronic mild cardiomegaly with aortic tortuosity. No effusion or pneumothorax. Symmetric biapical pleural thickening that is stable. Unexpected and unexplained lucency projecting below the right diaphragm. This could be artifact from overlying interfaces, but would exclude pneumoperitoneum with dedicated imaging - upright KUB or noncontrast abdominal CT depending on suspicion. These results were called by telephone at the time of interpretation on 11/19/2015 at 7:13 am to Dr. Jola Schmidt , who verbally acknowledged these results. IMPRESSION: 1. Basilar lung opacities primarily concerning for bronchopneumonia or aspiration in this clinical setting. 2. Unexpected lucency below the right diaphragm, as above. Would obtain dedicated abdominal imaging to exclude pneumoperitoneum. Electronically Signed   By: Monte Fantasia M.D.   On: 11/19/2015 07:14    Microbiology: Recent Results (from the past 240 hour(s))  Blood culture (routine x 2)     Status: Abnormal   Collection Time: 11/19/15  7:04 AM  Result Value Ref Range Status   Specimen Description BLOOD RIGHT FOREARM  Final   Special Requests BOTTLES DRAWN AEROBIC AND ANAEROBIC 5CC  Final   Culture  Setup Time   Final    GRAM POSITIVE COCCI IN CHAINS ANAEROBIC BOTTLE ONLY CRITICAL RESULT  CALLED TO, READ BACK BY AND VERIFIED WITH: LINDSEY BISHOP,RN AT 1945 ON 11/19/15 BY W Bouwman    Culture STREPTOCOCCUS GROUP G (A)  Final   Report Status 11/21/2015 FINAL  Final   Organism ID, Bacteria STREPTOCOCCUS GROUP G  Final      Susceptibility   Streptococcus group g - MIC*    CLINDAMYCIN <=0.25 SENSITIVE Sensitive     AMPICILLIN <=0.25 SENSITIVE Sensitive     ERYTHROMYCIN <=0.12 SENSITIVE Sensitive     VANCOMYCIN 0.5 SENSITIVE Sensitive     CEFTRIAXONE <=0.12 SENSITIVE Sensitive     LEVOFLOXACIN 0.5  SENSITIVE Sensitive     * STREPTOCOCCUS GROUP G  Blood culture (routine x 2)     Status: None (Preliminary result)   Collection Time: 11/19/15  7:16 AM  Result Value Ref Range Status   Specimen Description BLOOD LEFT ANTECUBITAL  Final   Special Requests BOTTLES DRAWN AEROBIC ONLY [place]  Final   Culture NO GROWTH 2 DAYS  Final   Report Status PENDING  Incomplete  Urine culture     Status: None   Collection Time: 11/19/15  7:23 AM  Result Value Ref Range Status   Specimen Description URINE, CATHETERIZED  Final   Special Requests NONE  Final   Culture NO GROWTH 1 DAY  Final   Report Status 11/20/2015 FINAL  Final  MRSA PCR Screening     Status: None   Collection Time: 11/19/15 10:46 PM  Result Value Ref Range Status   MRSA by PCR NEGATIVE NEGATIVE Final    Comment:        The GeneXpert MRSA Assay (FDA approved for NASAL specimens only), is one component of a comprehensive MRSA colonization surveillance program. It is not intended to diagnose MRSA infection nor to guide or monitor treatment for MRSA infections.      Labs: Basic Metabolic Panel:  Recent Labs Lab 11/19/15 0650 11/20/15 0504 11/21/15 0408 11/22/15 0442 11/22/15 1149  NA 142 139 142 139 143  K 4.5 3.7 4.2 5.6* 4.0  CL 103 108 103 103 102  CO2 27 22 28 24 29   GLUCOSE 125* 94 104* 100* 110*  BUN 12 12 14 18 18   CREATININE 1.13* 0.96 1.15* 1.25* 1.34*  CALCIUM 9.4 8.3* 8.7* 8.6* 9.1   Liver Function Tests:  Recent Labs Lab 11/19/15 0650 11/20/15 0504  AST 20 26  ALT 16 17  ALKPHOS 72 52  BILITOT 0.9 0.9  PROT 7.1 5.7*  ALBUMIN 3.9 2.9*   No results for input(s): LIPASE, AMYLASE in the last 168 hours. No results for input(s): AMMONIA in the last 168 hours. CBC:  Recent Labs Lab 11/19/15 0650 11/20/15 0504  WBC 8.4 7.0  NEUTROABS 6.9 6.0  HGB 12.2 10.7*  HCT 40.5 34.3*  MCV 88.6 89.1  PLT 144* 112*   Cardiac Enzymes:  Recent Labs Lab 11/19/15 0650  TROPONINI <0.03   BNP: BNP  (last 3 results)  Recent Labs  11/20/15 0833  BNP 423.6*        Signed:  Oswald Hillock MD.  [facility] 11/22/2015, 12:41 PM

## 2015-11-22 NOTE — Care Management Important Message (Signed)
Important Message  Patient Details  Name: [name] MRN: [id number] Date of Birth: 08-Mar-1928   Medicare Important Message Given:  Yes    Nathen May 11/22/2015, 10:18 AM

## 2015-11-22 NOTE — Progress Notes (Signed)
Discharge Note:  Patient is alert and oriented X 4 and in no distress.  Discharge instructions regarding signs and symptoms to report, medications, diet, activity, and upcoming appointments given to patient and her spouse. They both verbalized understanding.  Peripheral IV's and telemetry discontinued.  Patient transported out via wheelchair by hospital volunteer.
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
## 2015-11-22 NOTE — Evaluation (Addendum)
Physical Therapy Evaluation Patient Details Name: [name] MRN: [id number] DOB: 10-28-1927 Today's Date: 11/22/2015   History of Present Illness  Adm 4/18 presents with Sepsis and acute respiratory failure from CAP. Gradual decrease in BP. Also noted acute CHF and LE cellulitis PMHx-COPD, PVD, CVA, spinal stenosis  Clinical Impression  Pt admitted with above diagnosis. Patient normally modified independent with rollator with no home O2. Pt currently with functional limitations due to the deficits listed below (see PT Problem List).  Pt will benefit from skilled PT to increase their independence and safety with mobility to allow discharge to the venue listed below.    Husband present and educated on incr fall risk if patient is using home O2 due to tubing. Husband verbalized understanding of need for standby assist while pt acclimating to using home O2. Educated both on techniques to manage O2 tubing while walking.     Follow Up Recommendations Home health PT;Supervision/Assistance - 24 hour    Equipment Recommendations  None recommended by PT    Recommendations for Other Services OT consult     Precautions / Restrictions Precautions Precautions: Fall;Other (comment) Precaution Comments: monitor BP and O2      Mobility  Bed Mobility Overal bed mobility: Modified Independent             General bed mobility comments: with rail  Transfers Overall transfer level: Needs assistance Equipment used: Rolling walker (2 wheeled) Transfers: Sit to/from Stand Sit to Stand: Min guard         General transfer comment: pt accustomed to rollator with brakes; required vc and assist for proper use of 2 wheel RW; x 3 due to pt incontinence of urine and "clean up"   Ambulation/Gait Ambulation/Gait assistance: Min guard Ambulation Distance (Feet): 70 Feet Assistive device: Rolling walker (2 wheeled) Gait Pattern/deviations: Step-through pattern;Decreased stride length;Trunk  flexed Gait velocity: slow, as appropriate for resp status   General Gait Details: steady with use of RW; standing rest break x 1 to don O2  Stairs            Wheelchair Mobility    Modified Rankin (Stroke Patients Only)       Balance Overall balance assessment: Needs assistance         Standing balance support: Bilateral upper extremity supported Standing balance-Leahy Scale: Poor                               Pertinent Vitals/Pain See separate O2 note and flowsheet  Pain Assessment: Faces Faces Pain Scale: Hurts even more Pain Location: low back and legs Pain Descriptors / Indicators: Discomfort Pain Intervention(s): Limited activity within patient's tolerance;Monitored during session;Repositioned    Home Living Family/patient expects to be discharged to:: Private residence Living Arrangements: Spouse/significant other Available Help at Discharge: Family;Available 24 hours/day Type of Home: House Home Access: Level entry     Home Layout: One level Home Equipment: Walker - 4 wheels;Cane - single point;Wheelchair - power;Shower seat (electric w/c given to her, has not used it)      Prior Function Level of Independence: Independent with assistive device(s)         Comments: uses rollator at all times     Hand Dominance        Extremity/Trunk Assessment   Upper Extremity Assessment: Generalized weakness           Lower Extremity Assessment: Generalized weakness      Cervical /  Trunk Assessment: Kyphotic  Communication   Communication: HOH  Cognition Arousal/Alertness: Awake/alert Behavior During Therapy: WFL for tasks assessed/performed Overall Cognitive Status: Within Functional Limits for tasks assessed                      General Comments      Exercises        Assessment/Plan    PT Assessment Patient needs continued PT services  PT Diagnosis Difficulty walking;Generalized weakness   PT Problem List  Decreased strength;Decreased activity tolerance;Decreased balance;Decreased mobility;Decreased knowledge of use of DME;Decreased safety awareness;Cardiopulmonary status limiting activity;Impaired sensation;Obesity;Pain  PT Treatment Interventions DME instruction;Gait training;Functional mobility training;Therapeutic activities;Therapeutic exercise;Balance training;Cognitive remediation;Patient/family education   PT Goals (Current goals can be found in the Care Plan section) Acute Rehab PT Goals Patient Stated Goal: return home PT Goal Formulation: With patient/family Time For Goal Achievement: 11/29/15 Potential to Achieve Goals: Good    Frequency Min 3X/week   Barriers to discharge        Co-evaluation               End of Session Equipment Utilized During Treatment: Gait belt;Oxygen Activity Tolerance: Patient limited by fatigue Patient left: in chair;with call bell/phone within reach;with chair alarm set;with family/visitor present Nurse Communication: Mobility status;Other (comment) (decr SaO2 on RA)         Time: [id number] PT Time Calculation (min) (ACUTE ONLY): 41 min   Charges:   PT Evaluation $PT Eval High Complexity: 1 Procedure PT Treatments $Gait Training: 8-22 mins $Therapeutic Activity: 8-22 mins   PT G Codes:        Brigitte Soderberg 12-06-2015, 9:58 AM Pager [phone]

## 2015-11-24 LAB — CULTURE, BLOOD (ROUTINE X 2): Culture: NO GROWTH

## 2015-11-25 ENCOUNTER — Other Ambulatory Visit: Payer: Self-pay | Admitting: Neurology

## 2015-11-25 NOTE — Telephone Encounter (Signed)
Last OV: 11/07/15 Next OV: 05/12/16 Keppra 750mg  twice daily

## 2015-11-26 ENCOUNTER — Encounter: Payer: Self-pay | Admitting: Family Medicine

## 2015-11-26 ENCOUNTER — Ambulatory Visit (INDEPENDENT_AMBULATORY_CARE_PROVIDER_SITE_OTHER): Payer: Medicare Other | Admitting: Family Medicine

## 2015-11-26 VITALS — BP 100/54 | HR 96 | Temp 98.9°F | Wt 192.0 lb

## 2015-11-26 DIAGNOSIS — A419 Sepsis, unspecified organism: Secondary | ICD-10-CM

## 2015-11-26 DIAGNOSIS — J189 Pneumonia, unspecified organism: Secondary | ICD-10-CM

## 2015-11-26 DIAGNOSIS — J9601 Acute respiratory failure with hypoxia: Secondary | ICD-10-CM | POA: Diagnosis not present

## 2015-11-26 DIAGNOSIS — B954 Other streptococcus as the cause of diseases classified elsewhere: Secondary | ICD-10-CM | POA: Diagnosis not present

## 2015-11-26 DIAGNOSIS — R7881 Bacteremia: Secondary | ICD-10-CM | POA: Diagnosis not present

## 2015-11-26 DIAGNOSIS — L03115 Cellulitis of right lower limb: Secondary | ICD-10-CM | POA: Diagnosis not present

## 2015-11-26 DIAGNOSIS — B955 Unspecified streptococcus as the cause of diseases classified elsewhere: Secondary | ICD-10-CM

## 2015-11-26 MED ORDER — CEPHALEXIN 500 MG PO CAPS: 500.0000 mg | ORAL_CAPSULE | Freq: Two times a day (BID) | ORAL | Status: DC

## 2015-11-26 NOTE — Patient Instructions (Signed)
We will call you within a week about your referral to get echocardiogram they had intended to do in hospital. If you do not hear within 2 weeks, give [place] a call.  Continue lasix for now  Check labs before you go  Extend keflex for another 5 days so you can have 14 days total- pick up from pharmacy

## 2015-11-26 NOTE — Assessment & Plan Note (Signed)
Appears the primary concern was diastolic heart failure leading to hypoxia. The discharge summary primarily mentions diuresis improving her breathing status. We will continue her Lasix 20 mg daily and check a bmet today. Plan was for echocardiogram in the hospital but never completed so we ordered this as well. There were some changes on chest x-ray concerning for pneumonia as well but this was not specifically commented on in regards to treatment and discharge summary. She is still using oxygen at home but sats today were 95% without oxygen. I encouraged patient only to use if she was short of breath. She can be titrated off completely by physical therapy during her activities

## 2015-11-26 NOTE — Progress Notes (Signed)
Subjective:  RAYMIE GORDEN is a 82 y.o. year old very pleasant female patient who presents for hospital follow-up for group G streptococcus bacteremia ROS- no fever, chills, nausea, vomiting.  Patient is an 82 year old woman with history of COPD, hypertension, A. fib on Xarelto, stage III CKD, hyperlipidemia, history of large B-cell lymphoma as well as laryngeal carcinoma, history of focal epilepsy, history of cardioembolic stroke, history of spinal stenosis who presented to the hospital on 11/19/2015 and was found to be in sepsis due to group G strep bacteremia. She presented with fever of near 105 and she initially had O2 saturations in the 60s which improved on nonrebreather. She complained of leg pain on the right and has severe cellulitis. Chest x-ray also showed potential left lower lobe infiltrate concerning for pneumonia.  The source of her bacteremia was thought to be a right lower extremity cellulitis. Patient was treated with ceftriaxone in the hospital. She was converted to Keflex upon discharge for 5 more days. Appears case was discussed with infectious disease but no formal consult was completed. This appears to be a total of approximately 9 days. Blood cultures were positive in 1 out of 2 bottles.  Patient with known COPD but was not actively wheezing. Her dyspnea on exertion was thought to be due to acute diastolic heart failure. There was little comment in the discharge summary about pneumonias role into her shortness of breath. She does not have an echocardiogram with clear diastolic dysfunction and the plan was for echocardiogram in hospital but for unclear reasons this was never completed before discharge. The assumption diastolic heart failure was based on left ventricular dilation on echocardiogram in early 2016. She was started on Lasix 20 mg every 12 hours and had a good response in regards to her breathing patterns. She was converted to 20 mg Lasix by mouth daily before  discharge and has tolerated this well.  Her other medical problems were status quo in Hospital. She was continued on bisoprolol for her hypertension. She was continued on Xarelto and  bisoprolol for her atrial fibrillation. She is continued on Keppra for history of seizures. She did require 1 dose of Kayexalate for hyperkalemia. She was continued on atorvastatin for hyperlipidemia. She is continuing on gabapentin and Lyrica for her spinal stenosis.   Past Medical History-  Patient Active Problem List   Diagnosis Date Noted  . Acute respiratory failure with hypoxia ([place]) 11/19/2015    Priority: High  . History of cardioembolic stroke [id number]    Priority: High  . Spinal stenosis, lumbar region, with neurogenic claudication 05/09/2014    Priority: High  . History of Focal seizure ([place]) 06/16/2013    Priority: High  . TIA (transient ischemic attack) 05/31/2013    Priority: High  . History of Large B-cell lymphoma ([place]) 02/18/2012    Priority: High  . Atrial fibrillation ([place]) 12/14/2008    Priority: High  . Bacteremia due to Streptococcus 11/21/2015    Priority: Medium  . Cellulitis of leg, right 11/20/2015    Priority: Medium  . Diverticulitis of colon 09/26/2014    Priority: Medium  . CKD (chronic kidney disease), stage III 09/26/2014    Priority: Medium  . Laryngeal carcinoma ([place])     Priority: Medium  . Anxiety state 10/20/2008    Priority: Medium  . Depression 10/20/2008    Priority: Medium  . COPD (chronic obstructive pulmonary disease) ([place]) 03/26/2008    Priority: Medium  . Hyperlipidemia 03/18/2007    Priority: Medium  .  Essential hypertension 03/18/2007    Priority: Medium  . Weakness generalized 06/30/2013    Priority: Low  . Bilateral hand pain 01/09/2013    Priority: Low    Medications- reviewed and updated Current Outpatient Prescriptions  Medication Sig Dispense Refill  . atorvastatin (LIPITOR) 20 MG tablet TAKE ONE TABLET BY MOUTH ONCE DAILY 30 tablet  5  . bisoprolol (ZEBETA) 5 MG tablet TAKE ONE TABLET BY MOUTH ONCE DAILY 30 tablet 5  . cholecalciferol (VITAMIN D) 1000 UNITS tablet Take 1,000 Units by mouth daily.    . diclofenac sodium (VOLTAREN) 1 % GEL Apply 2 g topically 4 (four) times daily. 1 Tube 1  . furosemide (LASIX) 20 MG tablet Take 1 tablet (20 mg total) by mouth daily. 30 tablet 2  . gabapentin (NEURONTIN) 100 MG capsule Take 1 capsule (100 mg total) by mouth 3 (three) times daily. 90 capsule 4  . Glucosamine HCl (GLUCOSAMINE PO) Take 1 tablet by mouth daily.     Marland Kitchen levETIRAcetam (KEPPRA) 750 MG tablet TAKE ONE TABLET BY MOUTH TWICE DAILY 60 tablet 5  . loperamide (IMODIUM) 2 MG capsule Take 2 mg by mouth daily as needed for diarrhea or loose stools.     . pregabalin (LYRICA) 75 MG capsule Take 75 mg by mouth 2 (two) times daily.    Alveda Reasons 15 MG TABS tablet TAKE ONE TABLET BY MOUTH ONCE DAILY WITH  SUPPER 90 tablet 3  . acetaminophen (TYLENOL) 650 MG CR tablet Take 650 mg by mouth every 8 (eight) hours as needed for pain. Reported on 11/26/2015    . ALPRAZolam (XANAX) 0.25 MG tablet Take 1 tablet (0.25 mg total) by mouth once as needed for anxiety (before procedure). (Patient not taking: Reported on 11/26/2015) 15 tablet 0  . cephALEXin (KEFLEX) 500 MG capsule Take 1 capsule (500 mg total) by mouth 2 (two) times daily. 10 capsule 0   No current facility-administered medications for this visit.    Objective: BP 100/54 mmHg  Pulse 96  Temp(Src) 98.9 F (37.2 C)  Wt 192 lb (87.091 kg)  SpO2 95% Gen: NAD, resting comfortably CV: RRR no murmurs rubs or gallops Lungs: CTAB no crackles, wheeze, rhonchi Abdomen: soft/nontender/nondistended/normal bowel sounds. No rebound or guarding.  Ext: Trace edema on right, very slight erythema with no warmth at ankle left leg largely normal,  Skin: warm, dry Neuro: grossly normal, moves all extremities, Walks with walker  Assessment/Plan:  Acute respiratory failure with hypoxia  (HCC) Appears the primary concern was diastolic heart failure leading to hypoxia. The discharge summary primarily mentions diuresis improving her breathing status. We will continue her Lasix 20 mg daily and check a bmet today. Plan was for echocardiogram in the hospital but never completed so we ordered this as well. There were some changes on chest x-ray concerning for pneumonia as well but this was not specifically commented on in regards to treatment and discharge summary. She is still using oxygen at home but sats today were 95% without oxygen. I encouraged patient only to use if she was short of breath. She can be titrated off completely by physical therapy during her activities  Cellulitis of leg, right Cellulitis has resolved on exam. Extending Keflex course primarily due to bacteremia history.  Bacteremia due to Streptococcus Will extend course of full 14 days with an extra 5 days of Keflex due to Streptococcus group G bacteremia.  Sepsis due to pneumonia ([place]) Appears sepsis was primarily due to cellulitis leading to bacteremia  and not due to pneumonia. Will resolve the issue   Return precautions advised.   Orders Placed This Encounter  Procedures  . CBC with Differential/Platelet  . Comprehensive metabolic panel    [place]  . ECHOCARDIOGRAM COMPLETE    Standing Status: Future     Number of Occurrences:      Standing Expiration Date: 02/25/2017    Order Specific Question:  Where should this test be performed    Answer:  [facility])    Order Specific Question:  Complete or Limited study?    Answer:  Complete    Order Specific Question:  With Image Enhancing Agent or without Image Enhancing Agent?    Answer:  With Image Enhancing Agent    Order Specific Question:  Reason for exam-Echo    Answer:  Dyspnea  786.09 / R06.00    Meds ordered this encounter  Medications  . cephALEXin (KEFLEX) 500 MG capsule    Sig: Take 1 capsule (500 mg total) by mouth 2  (two) times daily.    Dispense:  10 capsule    Refill:  0    [name], MD

## 2015-11-26 NOTE — Assessment & Plan Note (Signed)
Will extend course of full 14 days with an extra 5 days of Keflex due to Streptococcus group G bacteremia.

## 2015-11-26 NOTE — Assessment & Plan Note (Signed)
Cellulitis has resolved on exam. Extending Keflex course primarily due to bacteremia history.

## 2015-11-26 NOTE — Assessment & Plan Note (Signed)
Appears sepsis was primarily due to cellulitis leading to bacteremia and not due to pneumonia. Will resolve the issue

## 2015-11-27 ENCOUNTER — Other Ambulatory Visit: Payer: Self-pay

## 2015-11-27 ENCOUNTER — Telehealth: Payer: Self-pay | Admitting: Family Medicine

## 2015-11-27 ENCOUNTER — Ambulatory Visit (HOSPITAL_COMMUNITY): Payer: Medicare Other | Attending: Cardiovascular Disease

## 2015-11-27 DIAGNOSIS — I340 Nonrheumatic mitral (valve) insufficiency: Secondary | ICD-10-CM | POA: Diagnosis not present

## 2015-11-27 DIAGNOSIS — J9601 Acute respiratory failure with hypoxia: Secondary | ICD-10-CM

## 2015-11-27 DIAGNOSIS — I1310 Hypertensive heart and chronic kidney disease without heart failure, with stage 1 through stage 4 chronic kidney disease, or unspecified chronic kidney disease: Secondary | ICD-10-CM | POA: Diagnosis not present

## 2015-11-27 DIAGNOSIS — Z87891 Personal history of nicotine dependence: Secondary | ICD-10-CM | POA: Insufficient documentation

## 2015-11-27 DIAGNOSIS — E785 Hyperlipidemia, unspecified: Secondary | ICD-10-CM | POA: Insufficient documentation

## 2015-11-27 DIAGNOSIS — I350 Nonrheumatic aortic (valve) stenosis: Secondary | ICD-10-CM | POA: Insufficient documentation

## 2015-11-27 DIAGNOSIS — R0600 Dyspnea, unspecified: Secondary | ICD-10-CM | POA: Diagnosis present

## 2015-11-27 DIAGNOSIS — N189 Chronic kidney disease, unspecified: Secondary | ICD-10-CM | POA: Insufficient documentation

## 2015-11-27 LAB — CBC WITH DIFFERENTIAL/PLATELET
BASOS ABS: 0 10*3/uL (ref 0.0–0.1)
Basophils Relative: 0.2 % (ref 0.0–3.0)
Eosinophils Absolute: 0.3 10*3/uL (ref 0.0–0.7)
Eosinophils Relative: 5.2 % — ABNORMAL HIGH (ref 0.0–5.0)
HCT: 35.2 % — ABNORMAL LOW (ref 36.0–46.0)
Hemoglobin: 11.6 g/dL — ABNORMAL LOW (ref 12.0–15.0)
LYMPHS ABS: 1.9 10*3/uL (ref 0.7–4.0)
Lymphocytes Relative: 29.2 % (ref 12.0–46.0)
MCHC: 33 g/dL (ref 30.0–36.0)
MCV: 84.8 fl (ref 78.0–100.0)
MONO ABS: 0.5 10*3/uL (ref 0.1–1.0)
MONOS PCT: 7.2 % (ref 3.0–12.0)
NEUTROS ABS: 3.7 10*3/uL (ref 1.4–7.7)
NEUTROS PCT: 58.2 % (ref 43.0–77.0)
PLATELETS: 239 10*3/uL (ref 150.0–400.0)
RBC: 4.16 Mil/uL (ref 3.87–5.11)
RDW: 15.3 % (ref 11.5–15.5)
WBC: 6.4 10*3/uL (ref 4.0–10.5)

## 2015-11-27 LAB — COMPREHENSIVE METABOLIC PANEL
ALT: 19 U/L (ref 0–35)
AST: 20 U/L (ref 0–37)
Albumin: 4.1 g/dL (ref 3.5–5.2)
Alkaline Phosphatase: 72 U/L (ref 39–117)
BILIRUBIN TOTAL: 0.6 mg/dL (ref 0.2–1.2)
BUN: 18 mg/dL (ref 6–23)
CO2: 34 meq/L — AB (ref 19–32)
Calcium: 10.2 mg/dL (ref 8.4–10.5)
Chloride: 102 mEq/L (ref 96–112)
Creatinine, Ser: 1.24 mg/dL — ABNORMAL HIGH (ref 0.40–1.20)
GFR: 43.62 mL/min — AB (ref >60.00)
GLUCOSE: 100 mg/dL — AB (ref 70–99)
POTASSIUM: 4.5 meq/L (ref 3.5–5.1)
Sodium: 144 mEq/L (ref 135–145)
Total Protein: 7.1 g/dL (ref 6.0–8.3)

## 2015-11-27 NOTE — Telephone Encounter (Signed)
See below

## 2015-11-27 NOTE — Telephone Encounter (Signed)
Pt has agreed to OT therapy for evaluation  for energy conservation, requesting verbal orders  Pt inconsistent with supplemental use of oxygen. Without oxygen, O2 ranges between 70 %- 80 % With oxygen 90 +  PT recommends skilled nursing however pt refuses.  Seated bp   120/70 Standing bp   94/60  Pt reports intermittent dizziness while standing.

## 2015-11-27 NOTE — Telephone Encounter (Signed)
Please call and encourage her to have skilled nursing come out. I am concerned that the oxygen readings are even accurate and if she needs oxygen. At our visit her oxygen was 95% when we appropriately warmed her fingers up

## 2015-11-28 NOTE — Telephone Encounter (Signed)
Called and lm for pt tcb. 

## 2015-11-29 NOTE — Telephone Encounter (Signed)
Returned pt call, no answer or machine on home phone and cell vm has not been set up, will try again later.

## 2015-11-29 NOTE — Telephone Encounter (Signed)
Pt had the echocardiogram this past Tues.  They were able to get her in sooner than they thought.   Husband wants to know if you have results, and if so, does pt need an appointment? Please advise.

## 2015-12-02 NOTE — Telephone Encounter (Signed)
Called back and spoke with pt and notified her of below message. Pt states she has a few other problems going on that she would like to discuss. Called transferred to scheduling.

## 2015-12-03 ENCOUNTER — Telehealth: Payer: Self-pay | Admitting: Family Medicine

## 2015-12-03 NOTE — Telephone Encounter (Signed)
See below

## 2015-12-03 NOTE — Telephone Encounter (Signed)
Can provide verbal orders for OT I am ok with patient using oxygen to keep saturations above 90% but hard to monitor her as cannot always get accurate reading We have her on fluid pills to keep fluid down in her body- her heart doesn't relax well (diastolic heart failure) Not sure what they mean by the blood pressure issue  Happy to clarify further at an office visit - she can schedule earlier if need be

## 2015-12-03 NOTE — Telephone Encounter (Signed)
[name] from [facility] called with various concerns:  -He is requesting verbal orders for occupational therapy for ADLs -Patient demonstrates inconsistent compliance with supplemental oxygen.  Please clarify if oxygen is 24/7 or PRN greater than 90% of the time. -Patient unclear about diagnosis of congestive heart failure -Patient demonstrates moderate blood pressure from sit to stand -Patient reports appointment with doctor on 12/09/15

## 2015-12-04 NOTE — Telephone Encounter (Signed)
[name] needs clarification if oxygen is supposed to be 24/7 OR PRN greater than %90.

## 2015-12-04 NOTE — Telephone Encounter (Signed)
Return Jim's call and provided below info. Informed him Dr. Yong Channel will go into further detail with pt at upcoming [place].

## 2015-12-04 NOTE — Telephone Encounter (Signed)
Yes thanks, prn above 90%

## 2015-12-05 DIAGNOSIS — I129 Hypertensive chronic kidney disease with stage 1 through stage 4 chronic kidney disease, or unspecified chronic kidney disease: Secondary | ICD-10-CM | POA: Diagnosis not present

## 2015-12-05 DIAGNOSIS — J449 Chronic obstructive pulmonary disease, unspecified: Secondary | ICD-10-CM | POA: Diagnosis not present

## 2015-12-05 DIAGNOSIS — J189 Pneumonia, unspecified organism: Secondary | ICD-10-CM | POA: Diagnosis not present

## 2015-12-05 DIAGNOSIS — N183 Chronic kidney disease, stage 3 (moderate): Secondary | ICD-10-CM | POA: Diagnosis not present

## 2015-12-09 ENCOUNTER — Other Ambulatory Visit: Payer: Self-pay

## 2015-12-09 ENCOUNTER — Ambulatory Visit (INDEPENDENT_AMBULATORY_CARE_PROVIDER_SITE_OTHER): Payer: Medicare Other | Admitting: Family Medicine

## 2015-12-09 ENCOUNTER — Encounter: Payer: Self-pay | Admitting: Family Medicine

## 2015-12-09 VITALS — BP 120/60 | HR 82 | Temp 98.1°F | Wt 199.0 lb

## 2015-12-09 DIAGNOSIS — I5032 Chronic diastolic (congestive) heart failure: Secondary | ICD-10-CM | POA: Diagnosis not present

## 2015-12-09 DIAGNOSIS — L03115 Cellulitis of right lower limb: Secondary | ICD-10-CM | POA: Diagnosis not present

## 2015-12-09 DIAGNOSIS — R059 Cough, unspecified: Secondary | ICD-10-CM

## 2015-12-09 DIAGNOSIS — R05 Cough: Secondary | ICD-10-CM

## 2015-12-09 DIAGNOSIS — I5030 Unspecified diastolic (congestive) heart failure: Secondary | ICD-10-CM | POA: Insufficient documentation

## 2015-12-09 DIAGNOSIS — K649 Unspecified hemorrhoids: Secondary | ICD-10-CM | POA: Diagnosis not present

## 2015-12-09 LAB — COMPREHENSIVE METABOLIC PANEL
ALBUMIN: 3.9 g/dL (ref 3.5–5.2)
ALK PHOS: 64 U/L (ref 39–117)
ALT: 17 U/L (ref 0–35)
AST: 17 U/L (ref 0–37)
BILIRUBIN TOTAL: 0.5 mg/dL (ref 0.2–1.2)
BUN: 17 mg/dL (ref 6–23)
CALCIUM: 9.4 mg/dL (ref 8.4–10.5)
CO2: 32 mEq/L (ref 19–32)
CREATININE: 1.08 mg/dL (ref 0.40–1.20)
Chloride: 103 mEq/L (ref 96–112)
GFR: 51.16 mL/min — AB (ref >60.00)
Glucose, Bld: 79 mg/dL (ref 70–99)
Potassium: 4.6 mEq/L (ref 3.5–5.1)
Sodium: 141 mEq/L (ref 135–145)
Total Protein: 6.9 g/dL (ref 6.0–8.3)

## 2015-12-09 LAB — CBC WITH DIFFERENTIAL/PLATELET
BASOS ABS: 0 10*3/uL (ref 0.0–0.1)
BASOS PCT: 0.5 % (ref 0.0–3.0)
EOS ABS: 0.3 10*3/uL (ref 0.0–0.7)
Eosinophils Relative: 5.4 % — ABNORMAL HIGH (ref 0.0–5.0)
HEMATOCRIT: 32.7 % — AB (ref 36.0–46.0)
HEMOGLOBIN: 10.8 g/dL — AB (ref 12.0–15.0)
LYMPHS PCT: 28.5 % (ref 12.0–46.0)
Lymphs Abs: 1.5 10*3/uL (ref 0.7–4.0)
MCHC: 33.1 g/dL (ref 30.0–36.0)
MCV: 84.8 fl (ref 78.0–100.0)
MONOS PCT: 10.1 % (ref 3.0–12.0)
Monocytes Absolute: 0.5 10*3/uL (ref 0.1–1.0)
NEUTROS ABS: 3 10*3/uL (ref 1.4–7.7)
Neutrophils Relative %: 55.5 % (ref 43.0–77.0)
PLATELETS: 193 10*3/uL (ref 150.0–400.0)
RBC: 3.86 Mil/uL — ABNORMAL LOW (ref 3.87–5.11)
RDW: 15.1 % (ref 11.5–15.5)
WBC: 5.4 10*3/uL (ref 4.0–10.5)

## 2015-12-09 MED ORDER — FUROSEMIDE 40 MG PO TABS: 40.0000 mg | ORAL_TABLET | Freq: Every day | ORAL | Status: DC

## 2015-12-09 NOTE — Progress Notes (Signed)
Subjective:  [name] is a 82 y.o. year old very pleasant female patient who presents for/with See problem oriented charting ROS- no chest pain, no SOB above baseline. Continued back pain.see any ROS included in HPI as well.   Past Medical History-  Patient Active Problem List   Diagnosis Date Noted  . Diastolic CHF ([place]) [id number]    Priority: High  . Acute respiratory failure with hypoxia ([place]) 11/19/2015    Priority: High  . History of cardioembolic stroke [id number]    Priority: High  . Spinal stenosis, lumbar region, with neurogenic claudication 05/09/2014    Priority: High  . History of Focal seizure ([place]) 06/16/2013    Priority: High  . TIA (transient ischemic attack) 05/31/2013    Priority: High  . History of Large B-cell lymphoma ([place]) 02/18/2012    Priority: High  . Atrial fibrillation ([place]) 12/14/2008    Priority: High  . Bacteremia due to Streptococcus 11/21/2015    Priority: Medium  . Cellulitis of leg, right 11/20/2015    Priority: Medium  . Diverticulitis of colon 09/26/2014    Priority: Medium  . CKD (chronic kidney disease), stage III 09/26/2014    Priority: Medium  . Laryngeal carcinoma ([place])     Priority: Medium  . Anxiety state 10/20/2008    Priority: Medium  . Depression 10/20/2008    Priority: Medium  . COPD (chronic obstructive pulmonary disease) ([place]) 03/26/2008    Priority: Medium  . Hyperlipidemia 03/18/2007    Priority: Medium  . Essential hypertension 03/18/2007    Priority: Medium  . Weakness generalized 06/30/2013    Priority: Low  . Bilateral hand pain 01/09/2013    Priority: Low    Medications- reviewed and updated Current Outpatient Prescriptions  Medication Sig Dispense Refill  . atorvastatin (LIPITOR) 20 MG tablet TAKE ONE TABLET BY MOUTH ONCE DAILY 30 tablet 5  . bisoprolol (ZEBETA) 5 MG tablet TAKE ONE TABLET BY MOUTH ONCE DAILY 30 tablet 5  . cholecalciferol (VITAMIN D) 1000 UNITS tablet Take 1,000 Units by mouth  daily.    . diclofenac sodium (VOLTAREN) 1 % GEL Apply 2 g topically 4 (four) times daily. 1 Tube 1  . furosemide (LASIX) 20 MG tablet Take 1 tablet (20 mg total) by mouth daily. 30 tablet 2  . gabapentin (NEURONTIN) 100 MG capsule Take 1 capsule (100 mg total) by mouth 3 (three) times daily. 90 capsule 4  . Glucosamine HCl (GLUCOSAMINE PO) Take 1 tablet by mouth daily.     Marland Kitchen loperamide (IMODIUM) 2 MG capsule Take 2 mg by mouth daily as needed for diarrhea or loose stools.     . pregabalin (LYRICA) 75 MG capsule Take 75 mg by mouth 2 (two) times daily.    Alveda Reasons 15 MG TABS tablet TAKE ONE TABLET BY MOUTH ONCE DAILY WITH  SUPPER 90 tablet 3  . acetaminophen (TYLENOL) 650 MG CR tablet Take 650 mg by mouth every 8 (eight) hours as needed for pain. Reported on 12/09/2015    . ALPRAZolam (XANAX) 0.25 MG tablet Take 1 tablet (0.25 mg total) by mouth once as needed for anxiety (before procedure). (Patient not taking: Reported on 11/26/2015) 15 tablet 0   No current facility-administered medications for this visit.    Objective: BP 120/60 mmHg  Pulse 82  Temp(Src) 98.1 F (36.7 C)  Wt 199 lb (90.266 kg)  SpO2 90% Gen: NAD, resting comfortably CV: RRR no murmurs rubs or gallops Lungs: CTAB no crackles, wheeze, rhonchi  Abdomen: soft/nontender/nondistended/normal bowel sounds. No rebound or guarding.  Ext: 1+ R slightly worse than left Skin: warm, dry, no erythema or rash on bilateral lower legs Neuro: grossly normal, moves all extremities, walks with 4 wheel walker  Declines rectal  Assessment/Plan:  Diastolic CHF ([place]) S: Long discussion on what diastolic CHF means- reviewed prior echocardiogram. She has been compliant with lasix 20mg  but despite this continues to have swelling in her legs (thinks slightly worse). Weight has also trended back up. Not wearing oxygen anymore when going out after prior pneumonia and no difficulty with this. ROS- fortunately no orthopnea or increased shortness  of breath, no PND Wt Readings from Last 3 Encounters:  12/09/15 199 lb (90.266 kg)  11/26/15 192 lb (87.091 kg)  11/19/15 200 lb 6.4 oz (90.9 kg)  A/P: check CBC, CMET with slight increase in edema and increased weight. Consider increasing lasix to 40mg  if kidney function can tolerate with known GFR in 45-55 range.    Cellulitis of leg, right S: history of cellulitis on right lower leg. Today on exam noted likely onychomychosis A/P: given onychomycosis is a risk factor for cellulitis advised sampling and consideration of treatment- patient firmly declines but agrees if recurrent would be agreeable to testing.  Last week started with cough, thermometer broke, heavy cough in AM. Lungs are clear on exam and she is well appearing. Advised CXR with history of pneumonia recently but patient declines  Blood on tissue with wiping- known hemorrhoids and feels a bulge when this happens. Advised preparation H and will check CBC to make sure no significant bleeding though only on tissues  PRN. Specific Return precautions advised.   Orders Placed This Encounter  Procedures  . CBC with Differential/Platelet  . Comprehensive metabolic panel    [name]   The duration of face-to-face time during this visit was 25 minutes. Greater than 50% of this time was spent in counseling, explanation of diagnosis, planning of further management, and/or coordination of care.    [name], MD

## 2015-12-09 NOTE — Patient Instructions (Signed)
Swelling in feet- may need to increase lasix  I wonder if that is fungus on your foot- you declined testing today- please come see me if you change your mind. If you get another skin infection we will have to treat this.   Let me know if you get a fever, cough worsens, or you get short of breath, or have worsening swelling as soon as possible  Can buy over the counter cream for hemorrhoid such as preparation H

## 2015-12-09 NOTE — Assessment & Plan Note (Signed)
S: history of cellulitis on right lower leg. Today on exam noted likely onychomychosis A/P: given onychomycosis is a risk factor for cellulitis advised sampling and consideration of treatment- patient firmly declines but agrees if recurrent would be agreeable to testing.

## 2015-12-09 NOTE — Assessment & Plan Note (Signed)
S: Long discussion on what diastolic CHF means- reviewed prior echocardiogram. She has been compliant with lasix 20mg  but despite this continues to have swelling in her legs (thinks slightly worse). Weight has also trended back up. Not wearing oxygen anymore when going out after prior pneumonia and no difficulty with this. ROS- fortunately no orthopnea or increased shortness of breath, no PND Wt Readings from Last 3 Encounters:  12/09/15 199 lb (90.266 kg)  11/26/15 192 lb (87.091 kg)  11/19/15 200 lb 6.4 oz (90.9 kg)  A/P: check CBC, CMET with slight increase in edema and increased weight. Consider increasing lasix to 40mg  if kidney function can tolerate with known GFR in 45-55 range.

## 2015-12-20 ENCOUNTER — Telehealth: Payer: Self-pay | Admitting: Oncology

## 2015-12-20 ENCOUNTER — Ambulatory Visit (HOSPITAL_BASED_OUTPATIENT_CLINIC_OR_DEPARTMENT_OTHER): Payer: Medicare Other | Admitting: Nurse Practitioner

## 2015-12-20 VITALS — BP 126/66 | HR 74 | Temp 98.0°F | Resp 17 | Ht 64.0 in | Wt 194.5 lb

## 2015-12-20 DIAGNOSIS — Z8521 Personal history of malignant neoplasm of larynx: Secondary | ICD-10-CM

## 2015-12-20 DIAGNOSIS — C8510 Unspecified B-cell lymphoma, unspecified site: Secondary | ICD-10-CM | POA: Diagnosis not present

## 2015-12-20 NOTE — Progress Notes (Signed)
[facility] OFFICE PROGRESS NOTE   Diagnosis:  Non-Hodgkin's lymphoma  INTERVAL HISTORY:   Ms. Schrimsher returns as scheduled. She was admitted with pneumonia and cellulitis about a month ago. She had fever at the time of admission. Otherwise she has had no fevers or sweats. She has a good appetite. No weight loss. Stable chronic back pain. She reports difficulty walking related to spinal stenosis. She has a walker.  She reports her daughter has been diagnosed with lung cancer and is receiving treatment in [place].  Objective:  Vital signs in last 24 hours:  Blood pressure 126/66, pulse 74, temperature 98 F (36.7 C), temperature source Oral, resp. rate 17, height 5\' 4"  (1.626 m), weight 194 lb 8 oz (88.225 kg), SpO2 92 %.    HEENT: No thrush or ulcers. Lymphatics: No palpable cervical, supraclavicular, axillary or inguinal lymph nodes. Resp: Lungs clear bilaterally. Cardio: Regular rate and rhythm. GI: Abdomen soft and nontender. No organomegaly. Vascular: No leg edema.   Lab Results:  Lab Results  Component Value Date   WBC 5.4 12/09/2015   HGB 10.8* 12/09/2015   HCT 32.7* 12/09/2015   MCV 84.8 12/09/2015   PLT 193.0 12/09/2015   NEUTROABS 3.0 12/09/2015    Imaging:  No results found.  Medications: I have reviewed the patient's current medications.  Assessment/Plan: 1. Non-Hodgkin's lymphoma, large B-cell lymphoma, CD20 positive involving 8 needle core biopsy of a right iliac lymph node 02/12/2012. CT of the abdomen and pelvis on 01/15/2012 showed abdominal/pelvic lymphadenopathy. Serum LDH in normal range on 02/25/2012. Staging PET scan 02/25/2012 with a small lymph node posteriolateral to the oropharynx on the left measuring 9 x 7 mm with SUV 5.4; large cluster of hypermetabolic lymph nodes in the right hilum with SUV 4.3; cluster of mildly enlarged hepatoduodenal ligament lymph nodes and mildly enlarged portacaval lymph node with  hypermetabolic activity (SUV max 37.1); numerous borderline enlarged and mildly enlarged periaortic retroperitoneal lymph nodes with extensive hypermetabolic activity (SUV max 43.6-47.6); numerous borderline enlarged and mildly enlarged hypermetabolic lymph nodes along the pelvic sidewall bilaterally most pronounced on the right with a right external iliac lymph node measuring 2.9 x 4.1 cm (SUV max 52.5). Bone marrow aspiration/biopsy 03/09/2012 negative for involvement with lymphoma. Status post cycle 1 of CHOP-Rituxan on 03/15/2012. She completed cycle 3 on 05/04/2012 and cycle 4 on 05/25/2012. Restaging PET scan on 06/10/2012 was markedly improved with resolution of the thoracic and retroperitoneal adenopathy and near complete resolution of porta hepatis adenopathy. She completed cycle 6 of CHOP/Rituxan on 07/06/2012. 2. History of night sweats likely secondary to #1. 3. Rectal bleeding June 2013 status post negative colonoscopy 01/28/2012 and upper endoscopy 02/11/2012. The bleeding was likely related to hemorrhoids. 4. COPD. 5. Remote history of larynx cancer. 6. Status post Port-A-Cath placement 03/09/2012. Removed 09/13/2012. 7. 2-D echo 03/11/2012 with left ventricular ejection fraction 60-65%. 8. Admission with febrile neutropenia 03/22/2012 with no source for infection identified 9. Admission with fever and a chest x-ray consistent with "pneumonia"on 03/27/2012. She completed a course of Levaquin. 10. Pancytopenia following cycle 1 of CHOP-rituximab-resolved. 11. History of atrial fibrillation-maintained on xarelto 12. Numbness/tingling in the fingertips and toes. The numbness in the toes predated the start of chemotherapy.. She also reports the tingling in the fingertips was present prior to the start of chemotherapy. The vincristine was dose reduced with cycle 2. Vincristine was discontinued beginning with cycle 3. 13. Cardioembolic strokes, seizures 2014. She is followed by  neurology. 14. Hospitalization with  pneumonia November 2014. 15. Hospitalization with pneumonia, cellulitis April 2017.   Disposition: Ms. Hoganson remains in clinical remission from non-Hodgkin's lymphoma. She will return for a follow-up visit in 8 months. She will contact the office in the interim with any problems.    [name] ANP/GNP-BC   12/20/2015  11:15 AM

## 2015-12-20 NOTE — Telephone Encounter (Signed)
Gave pt apt & avs °

## 2015-12-26 ENCOUNTER — Telehealth: Payer: Self-pay | Admitting: Family Medicine

## 2015-12-26 ENCOUNTER — Ambulatory Visit (INDEPENDENT_AMBULATORY_CARE_PROVIDER_SITE_OTHER): Payer: Medicare Other | Admitting: Family Medicine

## 2015-12-26 ENCOUNTER — Encounter: Payer: Self-pay | Admitting: Family Medicine

## 2015-12-26 VITALS — BP 128/80 | HR 74 | Wt 191.0 lb

## 2015-12-26 DIAGNOSIS — I5032 Chronic diastolic (congestive) heart failure: Secondary | ICD-10-CM | POA: Diagnosis not present

## 2015-12-26 DIAGNOSIS — M4806 Spinal stenosis, lumbar region: Secondary | ICD-10-CM

## 2015-12-26 DIAGNOSIS — M48062 Spinal stenosis, lumbar region with neurogenic claudication: Secondary | ICD-10-CM

## 2015-12-26 MED ORDER — FUROSEMIDE 20 MG PO TABS: 20.0000 mg | ORAL_TABLET | Freq: Every day | ORAL | Status: DC

## 2015-12-26 NOTE — Telephone Encounter (Signed)
I do not see any mention of O2 in today's note.  Would you like to order?

## 2015-12-26 NOTE — Telephone Encounter (Signed)
Pt was seen today and needs a order for pick up of her  oxygen tank [facility] to [facility]

## 2015-12-26 NOTE — Progress Notes (Signed)
Subjective:  [name] is a 82 y.o. year old very pleasant female patient who presents for/with See problem oriented charting ROS- no chest pain, continues to have low back and leg pain but manageable- walks with walker, no shortness of breath with basic exertion such as walking down hall. No headache or blurry vision..see any ROS included in HPI as well.   Past Medical History-  Patient Active Problem List   Diagnosis Date Noted  . Diastolic CHF ([place]) [id number]    Priority: High  . Acute respiratory failure with hypoxia ([place]) 11/19/2015    Priority: High  . History of cardioembolic stroke [id number]    Priority: High  . Spinal stenosis, lumbar region, with neurogenic claudication 05/09/2014    Priority: High  . History of Focal seizure ([place]) 06/16/2013    Priority: High  . TIA (transient ischemic attack) 05/31/2013    Priority: High  . History of Large B-cell lymphoma ([place]) 02/18/2012    Priority: High  . Atrial fibrillation ([place]) 12/14/2008    Priority: High  . Bacteremia due to Streptococcus 11/21/2015    Priority: Medium  . Cellulitis of leg, right 11/20/2015    Priority: Medium  . Diverticulitis of colon 09/26/2014    Priority: Medium  . CKD (chronic kidney disease), stage III 09/26/2014    Priority: Medium  . Laryngeal carcinoma ([place])     Priority: Medium  . Anxiety state 10/20/2008    Priority: Medium  . Depression 10/20/2008    Priority: Medium  . COPD (chronic obstructive pulmonary disease) ([place]) 03/26/2008    Priority: Medium  . Hyperlipidemia 03/18/2007    Priority: Medium  . Essential hypertension 03/18/2007    Priority: Medium  . Weakness generalized 06/30/2013    Priority: Low  . Bilateral hand pain 01/09/2013    Priority: Low    Medications- reviewed and updated Current Outpatient Prescriptions  Medication Sig Dispense Refill  . acetaminophen (TYLENOL) 650 MG CR tablet Take 650 mg by mouth every 8 (eight) hours as needed for pain. Reported  on 12/09/2015    . ALPRAZolam (XANAX) 0.25 MG tablet Take 1 tablet (0.25 mg total) by mouth once as needed for anxiety (before procedure). 15 tablet 0  . atorvastatin (LIPITOR) 20 MG tablet TAKE ONE TABLET BY MOUTH ONCE DAILY 30 tablet 5  . bisoprolol (ZEBETA) 5 MG tablet TAKE ONE TABLET BY MOUTH ONCE DAILY 30 tablet 5  . cholecalciferol (VITAMIN D) 1000 UNITS tablet Take 1,000 Units by mouth daily.    Marland Kitchen gabapentin (NEURONTIN) 100 MG capsule Take 1 capsule (100 mg total) by mouth 3 (three) times daily. 90 capsule 4  . Glucosamine HCl (GLUCOSAMINE PO) Take 1 tablet by mouth daily.     Marland Kitchen levETIRAcetam (KEPPRA) 750 MG tablet Take 750 mg by mouth 2 (two) times daily.    Marland Kitchen loperamide (IMODIUM) 2 MG capsule Take 2 mg by mouth daily as needed for diarrhea or loose stools.     Alveda Reasons 15 MG TABS tablet TAKE ONE TABLET BY MOUTH ONCE DAILY WITH  SUPPER 90 tablet 3  . diclofenac sodium (VOLTAREN) 1 % GEL Apply 2 g topically 4 (four) times daily. (Patient not taking: Reported on 12/26/2015) 1 Tube 1  . furosemide (LASIX) 20 MG tablet Take 1 tablet (20 mg total) by mouth daily. 30 tablet 5   No current facility-administered medications for this visit.    Objective: BP 128/80 mmHg  Pulse 74  Wt 191 lb (86.637 kg)  SpO2 93%  Gen: NAD, resting comfortably CV: RRR no murmurs rubs or gallops (appears in sinus today) Lungs: CTAB no crackles, wheeze, rhonchi Abdomen: soft/nontender/nondistended/normal bowel sounds. Ext: trace edema Skin: warm, dry, no rash Neuro: grossly normal, moves all extremities  Assessment/Plan:  Diastolic CHF (HCC) S: Last weight was up 9 lbs and had continued edema. Patient was increased to 40mg . She states she waited to run out of 20mg  and tried one 40mg  yesterday but simply could not tolerate. With only 20 mg- weight has reversed though and now down 8 lbs. Edema down to trace.  A/P: appears improved control on 20mg , advised her to continue 20mg  alone and sent in today. Follow up  3 months- sooner if worsening symptoms  Spinal stenosis, lumbar region, with neurogenic claudication S: we attempted lyrica for her spinal stenosis pain with neurogenic claudication. She tells me today she did nto tolerate this due to some confusion and she stopped 2 weeks ago. Feels like tylenol helps almost as much A/P: d/c lyrica and continue prn tylenol usage    Return in about 3 months (around 03/27/2016). Return precautions advised.   Meds ordered this encounter  Medications  . furosemide (LASIX) 20 MG tablet    Sig: Take 1 tablet (20 mg total) by mouth daily.    Dispense:  30 tablet    Refill:  5    [name], MD

## 2015-12-26 NOTE — Assessment & Plan Note (Signed)
S: we attempted lyrica for her spinal stenosis pain with neurogenic claudication. She tells me today she did nto tolerate this due to some confusion and she stopped 2 weeks ago. Feels like tylenol helps almost as much A/P: d/c lyrica and continue prn tylenol usage

## 2015-12-26 NOTE — Patient Instructions (Signed)
Weight and swelling are doing much better. I want you to continue 20mg  of the lasix to help maintain this.   Ok to be off lyrica if pain about the same on tylenol

## 2015-12-26 NOTE — Assessment & Plan Note (Signed)
S: Last weight was up 9 lbs and had continued edema. Patient was increased to 40mg . She states she waited to run out of 20mg  and tried one 40mg  yesterday but simply could not tolerate. With only 20 mg- weight has reversed though and now down 8 lbs. Edema down to trace.  A/P: appears improved control on 20mg , advised her to continue 20mg  alone and sent in today. Follow up 3 months- sooner if worsening symptoms

## 2015-12-26 NOTE — Telephone Encounter (Signed)
They mean to take oxygen away from the house- I am fine with verbal order to remove oxygen from home. She is doing much better after hospitalization

## 2015-12-27 NOTE — Telephone Encounter (Signed)
Order for oxygen discharge faxed to [place].

## 2016-01-21 ENCOUNTER — Other Ambulatory Visit: Payer: Self-pay | Admitting: Family Medicine

## 2016-03-02 ENCOUNTER — Other Ambulatory Visit: Payer: Self-pay | Admitting: Neurology

## 2016-03-09 ENCOUNTER — Telehealth: Payer: Self-pay | Admitting: Family Medicine

## 2016-03-09 NOTE — Telephone Encounter (Signed)
Pt states she is having a bowel issue. Pt has diarrhea, loose stools and cannot control her bowels. Does not know when it is coming. Pt has requested appointment today, however advised pt  Dr Yong Channel does not have anything. Pt states she was told he could see her any time she needed.  Refused to see another provider. Please advise

## 2016-03-09 NOTE — Telephone Encounter (Signed)
Pt scheduled at 8  Wednesday

## 2016-03-09 NOTE — Telephone Encounter (Signed)
I could do Wednesday 8 AM or she can see another provider. Its ok if you explain my situation to her

## 2016-03-11 ENCOUNTER — Encounter: Payer: Self-pay | Admitting: Family Medicine

## 2016-03-11 ENCOUNTER — Ambulatory Visit (INDEPENDENT_AMBULATORY_CARE_PROVIDER_SITE_OTHER): Payer: Medicare Other | Admitting: Family Medicine

## 2016-03-11 VITALS — BP 124/68 | HR 77 | Temp 97.5°F | Wt 192.4 lb

## 2016-03-11 DIAGNOSIS — K529 Noninfective gastroenteritis and colitis, unspecified: Secondary | ICD-10-CM | POA: Diagnosis not present

## 2016-03-11 NOTE — Progress Notes (Signed)
Subjective:  [name] is a 82 y.o. year old very pleasant female patient who presents for/with See problem oriented charting ROS- see any ROS included in HPI as well.   Past Medical History-  Patient Active Problem List   Diagnosis Date Noted  . Diastolic CHF ([place]) A999333    Priority: High  . Acute respiratory failure with hypoxia ([place]) 11/19/2015    Priority: High  . History of cardioembolic stroke [id number]    Priority: High  . Spinal stenosis, lumbar region, with neurogenic claudication 05/09/2014    Priority: High  . History of Focal seizure ([place]) 06/16/2013    Priority: High  . TIA (transient ischemic attack) 05/31/2013    Priority: High  . History of Large B-cell lymphoma ([place]) 02/18/2012    Priority: High  . Atrial fibrillation ([place]) 12/14/2008    Priority: High  . Bacteremia due to Streptococcus 11/21/2015    Priority: Medium  . Cellulitis of leg, right 11/20/2015    Priority: Medium  . Diverticulitis of colon 09/26/2014    Priority: Medium  . CKD (chronic kidney disease), stage III 09/26/2014    Priority: Medium  . Laryngeal carcinoma ([place])     Priority: Medium  . Anxiety state 10/20/2008    Priority: Medium  . Depression 10/20/2008    Priority: Medium  . COPD (chronic obstructive pulmonary disease) ([place]) 03/26/2008    Priority: Medium  . Hyperlipidemia 03/18/2007    Priority: Medium  . Essential hypertension 03/18/2007    Priority: Medium  . Weakness generalized 06/30/2013    Priority: Low  . Bilateral hand pain 01/09/2013    Priority: Low    Medications- reviewed and updated Current Outpatient Prescriptions  Medication Sig Dispense Refill  . acetaminophen (TYLENOL) 650 MG CR tablet Take 650 mg by mouth every 8 (eight) hours as needed for pain. Reported on 12/09/2015    . ALPRAZolam (XANAX) 0.25 MG tablet Take 1 tablet (0.25 mg total) by mouth once as needed for anxiety (before procedure). 15 tablet 0  . atorvastatin (LIPITOR) 20 MG tablet  TAKE ONE TABLET BY MOUTH ONCE DAILY 30 tablet 5  . bisoprolol (ZEBETA) 5 MG tablet TAKE ONE TABLET BY MOUTH ONCE DAILY 30 tablet 5  . cholecalciferol (VITAMIN D) 1000 UNITS tablet Take 1,000 Units by mouth daily.    . diclofenac sodium (VOLTAREN) 1 % GEL Apply 2 g topically 4 (four) times daily. 1 Tube 1  . gabapentin (NEURONTIN) 100 MG capsule TAKE ONE CAPSULE BY MOUTH THREE TIMES DAILY 90 capsule 0  . Glucosamine HCl (GLUCOSAMINE PO) Take 1 tablet by mouth daily.     Marland Kitchen levETIRAcetam (KEPPRA) 750 MG tablet Take 750 mg by mouth 2 (two) times daily.    Marland Kitchen loperamide (IMODIUM) 2 MG capsule Take 2 mg by mouth daily as needed for diarrhea or loose stools.     Alveda Reasons 15 MG TABS tablet TAKE ONE TABLET BY MOUTH ONCE DAILY WITH  SUPPER 90 tablet 3  . furosemide (LASIX) 20 MG tablet Take 1 tablet (20 mg total) by mouth daily. (Patient not taking: Reported on 03/11/2016) 30 tablet 5   No current facility-administered medications for this visit.     Objective: BP 124/68   Pulse 77   Temp 97.5 F (36.4 C) (Oral)   Wt 192 lb 6.4 oz (87.3 kg)   SpO2 (!) 89%   BMI 33.03 kg/m  Gen: NAD, resting comfortably CV: RRR no murmurs rubs or gallops Lungs: CTAB no crackles, wheeze,  rhonchi Abdomen: soft/nontender when distracted/nondistended/normal bowel sounds. No rebound or guarding.  Ext: no edema Skin: warm, dry, no rash Neuro: grossly normal, moves all extremities, walks with walker  Did not repeat o2- no signs of respiratory distress  Assessment/Plan:  Chronic loose stools/incontinence S: Patient called in 2 days ago complaining of diarrhea, loose stools and difficulty controlling her bowels. Started 7 years ago after operation for diverticulitis. Went away for her birthday and it just made it more obvious the issues she has as she cannot go out. Wears diapers at times. Comes on suddenly. Has tried fiber recently and stated that had helped- was more regular and much less diarrhea. Seemed to stop  working a few nights ago. Denies sick contact . Stools are thinner. Has hemorrhoids and those cause slight amount of blood- painful to have bowel movement. Immodium helps some. Overall- not recently worse and no worsening leg weakness ROS- no fever or chills. No nausea or vomiting A/P: 7 years of symptoms starting after prior diverticulosis surgery. Discussed GI referral option. Ultimately we just focused on symptomatic care with imodium prn and wearing a more dependable adult diaper such as dependz. No acute changes to suggest further workup- doubt this is spinal stenosis related.    Prn follow up  The duration of face-to-face time during this visit was 20 minutes. Greater than 50% of this time was spent in counseling, explanation of diagnosis, planning of further management, and/or coordination of care. Chronic issues distressing to patient.     Return precautions advised.  [name], MD

## 2016-03-11 NOTE — Progress Notes (Signed)
Pre visit review using our clinic review tool, if applicable. No additional management support is needed unless otherwise documented below in the visit note. 

## 2016-03-11 NOTE — Patient Instructions (Signed)
Can use imodium as needed for the rougher days- perhaps 1-2x a day  I would get some dependable adult diapers like dependz and wear regularly  I would stop the fiber

## 2016-03-17 ENCOUNTER — Telehealth: Payer: Self-pay | Admitting: Family Medicine

## 2016-03-17 DIAGNOSIS — R109 Unspecified abdominal pain: Secondary | ICD-10-CM

## 2016-03-17 NOTE — Telephone Encounter (Signed)
Yes thanks, may refer 

## 2016-03-17 NOTE — Telephone Encounter (Signed)
Pt is still having issues with her stomach. The diarrhea is continuing . Pretty bad today. Would like to precede with referral to GI doctor.

## 2016-03-30 ENCOUNTER — Other Ambulatory Visit: Payer: Self-pay | Admitting: Family Medicine

## 2016-04-20 ENCOUNTER — Other Ambulatory Visit: Payer: Self-pay | Admitting: Family Medicine

## 2016-04-20 ENCOUNTER — Other Ambulatory Visit: Payer: Self-pay | Admitting: Neurology

## 2016-04-21 ENCOUNTER — Other Ambulatory Visit: Payer: Self-pay | Admitting: *Deleted

## 2016-04-21 MED ORDER — GABAPENTIN 100 MG PO CAPS: 100.0000 mg | ORAL_CAPSULE | Freq: Three times a day (TID) | ORAL | 1 refills | Status: DC

## 2016-04-21 NOTE — Telephone Encounter (Signed)
Rx sent 

## 2016-04-24 ENCOUNTER — Encounter: Payer: Self-pay | Admitting: Gastroenterology

## 2016-04-24 ENCOUNTER — Ambulatory Visit (INDEPENDENT_AMBULATORY_CARE_PROVIDER_SITE_OTHER): Payer: Medicare Other | Admitting: Gastroenterology

## 2016-04-24 VITALS — BP 130/86 | HR 80 | Ht 64.5 in | Wt 192.0 lb

## 2016-04-24 DIAGNOSIS — R194 Change in bowel habit: Secondary | ICD-10-CM | POA: Diagnosis not present

## 2016-04-24 DIAGNOSIS — R198 Other specified symptoms and signs involving the digestive system and abdomen: Secondary | ICD-10-CM | POA: Diagnosis not present

## 2016-04-24 NOTE — Telephone Encounter (Signed)
Please advise if okay to refill Alprazolam?

## 2016-04-24 NOTE — Patient Instructions (Addendum)
Daily fiber supplement  As needed use of imodium  If you are age 80 or older, your body mass index should be between 23-30. Your Body mass index is 32.45 kg/m. If this is out of the aforementioned range listed, please consider follow up with your Primary Care Provider.  If you are age 2 or younger, your body mass index should be between 19-25. Your Body mass index is 32.45 kg/m. If this is out of the aformentioned range listed, please consider follow up with your Primary Care Provider.   Follow up as needed  Thank you for choosing [facility]  Dr Wilfrid Lund III

## 2016-04-24 NOTE — Telephone Encounter (Signed)
Yes thanks, same #, no refills- take out portion about before procedures.

## 2016-04-24 NOTE — Progress Notes (Signed)
[facility] Consult Note:  History: [name] 04/24/2016  Referring physician: [name], MD  Reason for consult/chief complaint: change in bowel habits (pt reports that ever since surgery her bm's were urgent, unpredictable, and unformed; often accompanied by abdominal pain)   Subjective  HPI:  [name] 01/2012: History of Present Illness: [name] is a pleasant 82 year old white female referred at the request of Dr. Arnoldo Morale for evaluation of rectal bleeding. She has a history of TIAs and is on Coumadin. Recently she's had several episodes of painless bright red blood per rectum consisting of blood in the toilet water. He denies change in bowel habits, abdominal or rectal pain. A CT scan, which I reviewed, demonstrated pathologically enlarged lymph nodes in the pelvis and aortic areas. A questionable kidney mass was raised. There were no obvious colonic abnormalities.  Family history is pertinent for a sister who had colon cancer in her 15s.  Approximately 3 years ago she underwent resection of the left colon for perforated diverticulitis. Colon Deatra Ina 6/13, left tics, random Bx nml. No mention of any surgical anastomosis seen.  Brianne describes aggravating lower digestive symptoms ever since her, located surgery for diverticulitis in 2010. It seems that she had a segmental resection for persistent diverticulitis, and it was complicated by perforation with abscess and need for further surgery. She has episodes almost daily of urgency with explosive and "hot" loose stool. She will use Imodium at times, but if she takes too much will make her constipated. She denies rectal bleeding. Chivonne denies abdominal pain anorexia or weight loss or vomiting.  ROS:  Review of Systems  Constitutional: Negative for appetite change and unexpected weight change.  HENT: Negative for mouth sores and voice change.   Eyes: Negative for pain and redness.  Respiratory: Negative  for cough and shortness of breath.   Cardiovascular: Negative for chest pain and palpitations.  Genitourinary: Negative for dysuria and hematuria.  Musculoskeletal: Positive for arthralgias. Negative for myalgias.  Skin: Negative for pallor and rash.  Neurological: Negative for weakness and headaches.  Hematological: Negative for adenopathy.     Past Medical History: Past Medical History:  Diagnosis Date  . ABDOMINAL INCISIONAL HERNIA 04/03/2010      . Anxiety   . Arthritis    "lots; hands" (05/26/2013)  . Blood transfusion    "w/1st miscarriage" (05/26/2013)  . COPD (chronic obstructive pulmonary disease) ([place])   . Coronary artery disease   . Depression   . Diverticulitis of colon with hemorrhage 07/24/2008  . DVT (deep venous thrombosis) ([place]) 1954   "after I had my first child" (05/26/2013)  . Exertional shortness of breath   . Family history of anesthesia complication    "sisters also get sick as a dog" (05/26/2013)  . GERD 05/03/2007      . GERD (gastroesophageal reflux disease)   . H/O hiatal hernia   . HCAP (healthcare-associated pneumonia) 06/26/2013  . Heart murmur    "when I was a child" (05/26/2013)  . History of TIAs   . Hyperlipidemia   . Hypertension   . Laryngeal carcinoma ([place]) hx of ca   remotely with resection and xrt/chronic hoarsemenss  . Lymphona, mantle cell, inguinal region/lower limb ([place])    "large c-cell" (05/26/2013)  . OSTEOARTHRITIS 05/03/2007  . PEPTIC ULCER DISEASE 07/06/2008  . Pneumonia 2013   "once" (05/26/2013)  . PUD (peptic ulcer disease)   . PVD (peripheral vascular disease) ([place])   . Seizures ([place])   . Stroke [facility])  05/26/2013   "very mild; affected my speech for a while" (05/26/2013)  . VARICOSE VEINS LOWER EXTREMITIES W/INFLAMMATION 01/15/2009     Past Surgical History: Past Surgical History:  Procedure Laterality Date  . ABDOMINAL EXPLORATION SURGERY  12/01/2012  . APPENDECTOMY  09/13/2008  . CAROTID ENDARTERECTOMY Bilateral  2004   2004 a month apart right and left  . CARPAL TUNNEL RELEASE Left ?1960's  . CATARACT EXTRACTION  2005   bilateral  . COLON SURGERY  09/13/2008   Laparoscopic assisted converted to open sigmoid colectomy.Archie Endo 09/13/2008 (05/26/2013)  . DILATION AND CURETTAGE OF UTERUS  1950's   "had 2 for miscarriages" (05/26/2013)  . DIVERTING ILEOSTOMY  09/22/2008   iliostomy for diverticulitis/notes 09/22/2008 (05/26/2013)  . HERNIA REPAIR  05/2010   ventral hernia repair  . ILEOSTOMY CLOSURE  01/2010  . RADICAL NECK DISSECTION  1986   "larynx cancer" (05/26/2013)  . TONSILLECTOMY AND ADENOIDECTOMY  1936   "tonsils grew back; no 2nd OR" (05/26/2013)  . TOTAL KNEE ARTHROPLASTY Bilateral    2002 left 2008 right  . TUBAL LIGATION  1972     Family History: Family History  Problem Relation Age of Onset  . Colon cancer Sister     colon  . Hyperlipidemia Mother   . Stroke Mother   . Cancer Father     mesothelioma    Social History: Social History   Social History  . Marital status: Married    Spouse name: N/A  . Number of children: 6  . Years of education: N/A   Occupational History  . retired Retired   Social History Main Topics  . Smoking status: Former Smoker    Packs/day: 2.50    Years: 37.00    Types: Cigarettes    Quit date: 09/05/1984  . Smokeless tobacco: Never Used  . Alcohol use No  . Drug use: No  . Sexual activity: No   Other Topics Concern  . None   Social History Narrative   Married (husband patient of Dr. Yong Channel). 6 children (lost 2 for total 8). 15 grandkids. 6 greatgrandchildren.    Lives with husband. Husband drives. Patient does not drive.       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     Allergies: Allergies  Allergen Reactions  . Ativan [Lorazepam] Other (See Comments)    Severe hallucinations   . Propoxyphene Hcl   . Codeine Itching  . Penicillins Rash  . Vancomycin Rash     Outpatient Meds: Current Outpatient Prescriptions  Medication Sig Dispense Refill  . acetaminophen (TYLENOL) 650 MG CR tablet Take 650 mg by mouth every 8 (eight) hours as needed for pain. Reported on 12/09/2015    . ALPRAZolam (XANAX) 0.25 MG tablet Take 1 tablet (0.25 mg total) by mouth once as needed for anxiety (before procedure). 15 tablet 0  . atorvastatin (LIPITOR) 20 MG tablet TAKE ONE TABLET BY MOUTH ONCE DAILY 30 tablet 5  . cholecalciferol (VITAMIN D) 1000 UNITS tablet Take 1,000 Units by mouth daily.    . diclofenac sodium (VOLTAREN) 1 % GEL Apply 2 g topically 4 (four) times daily. 1 Tube 1  . gabapentin (NEURONTIN) 100 MG capsule Take 1 capsule (100 mg total) by mouth 3 (three) times daily. 90 capsule 1  . Glucosamine HCl (GLUCOSAMINE PO) Take 1 tablet by mouth daily.     Marland Kitchen levETIRAcetam (KEPPRA) 750 MG tablet Take 750  mg by mouth 2 (two) times daily.    Marland Kitchen loperamide (IMODIUM) 2 MG capsule Take 2 mg by mouth daily as needed for diarrhea or loose stools.     Alveda Reasons 15 MG TABS tablet TAKE ONE TABLET BY MOUTH ONCE DAILY WITH  SUPPER 90 tablet 3   No current facility-administered medications for this visit.       ___________________________________________________________________ Objective   Exam:  BP 130/86   Pulse 80   Ht 5' 4.5" (1.638 m)   Wt 192 lb (87.1 kg)   BMI 32.45 kg/m    General: this is a(n) well-appearing elderly woman, pleasant and conversational. Husband was present for the entire encounter.   Eyes: sclera anicteric, no redness  ENT: oral mucosa moist without lesions, no cervical or supraclavicular lymphadenopathy, good dentition  CV: RRR without murmur, S1/S2, no JVD, no peripheral edema  Resp: clear to auscultation bilaterally, normal RR and effort noted  GI: soft, no tenderness, with active bowel sounds. No guarding or palpable organomegaly noted.  Skin; warm and dry, no rash or jaundice noted  Neuro: awake, alert and oriented x 3.  Normal gross motor function and fluent speech. Antalgic gait  Previous operative reports and multiple CT scans of abdomen and pelvis from 2010 and 2011 were reviewed. She also had a CT scan abdomen and pelvis in April 2017. This appears to been done in the setting of abdominal pain and altered mental status. There was evidence of the sigmoid colon anastomosis, and some stable retro peritoneal and pelvic adenopathy  Assessment: Encounter Diagnoses  Name Primary?  Marland Kitchen Altered bowel function Yes  . Tenesmus     Is a long-standing changes in her bowel habits related to resection of the sigmoid and proximal rectum. It is affecting the normal neuromuscular defecate Mervyn Gay function. Suspect there is a mild element of IBS as well, since seems to often occur after meals or when she anticipates needing to find a bathroom because she is going out of the house.  Plan:  Once daily fiber supplement Imodium as needed No further tests planned right now, follow up as needed.  Thank you for the courtesy of this consult.  Please call me with any questions or concerns.  Nelida Meuse III  CC: [name], MD

## 2016-04-27 ENCOUNTER — Other Ambulatory Visit: Payer: Self-pay | Admitting: Family Medicine

## 2016-04-28 ENCOUNTER — Encounter: Payer: Self-pay | Admitting: Family Medicine

## 2016-04-28 ENCOUNTER — Ambulatory Visit (INDEPENDENT_AMBULATORY_CARE_PROVIDER_SITE_OTHER): Payer: Medicare Other | Admitting: Family Medicine

## 2016-04-28 ENCOUNTER — Other Ambulatory Visit: Payer: Self-pay

## 2016-04-28 VITALS — BP 134/88 | HR 82 | Temp 97.8°F | Wt 193.8 lb

## 2016-04-28 DIAGNOSIS — Z23 Encounter for immunization: Secondary | ICD-10-CM | POA: Diagnosis not present

## 2016-04-28 DIAGNOSIS — E785 Hyperlipidemia, unspecified: Secondary | ICD-10-CM

## 2016-04-28 DIAGNOSIS — I5032 Chronic diastolic (congestive) heart failure: Secondary | ICD-10-CM | POA: Diagnosis not present

## 2016-04-28 DIAGNOSIS — I480 Paroxysmal atrial fibrillation: Secondary | ICD-10-CM | POA: Diagnosis not present

## 2016-04-28 MED ORDER — ALPRAZOLAM 0.25 MG PO TABS: 0.2500 mg | ORAL_TABLET | Freq: Once | ORAL | 0 refills | Status: DC | PRN

## 2016-04-28 MED ORDER — FUROSEMIDE 20 MG PO TABS: 20.0000 mg | ORAL_TABLET | Freq: Every day | ORAL | 2 refills | Status: DC | PRN

## 2016-04-28 MED ORDER — BISOPROLOL FUMARATE 5 MG PO TABS: 5.0000 mg | ORAL_TABLET | Freq: Every day | ORAL | 5 refills | Status: DC

## 2016-04-28 MED ORDER — DICLOFENAC SODIUM 1 % TD GEL: 2.0000 g | Freq: Four times a day (QID) | TRANSDERMAL | 1 refills | Status: DC

## 2016-04-28 NOTE — Assessment & Plan Note (Addendum)
S: weight up 1 lb. Edema slightly up. 20mg  lasix has been forgetting to take A/P: we discussed taking medicatoin every day for 53days when swelling worsens- chang eto prn for weight gain or edema. Monitor weights

## 2016-04-28 NOTE — Progress Notes (Signed)
Pre visit review using our clinic review tool, if applicable. No additional management support is needed unless otherwise documented below in the visit note. 

## 2016-04-28 NOTE — Assessment & Plan Note (Signed)
S: patient has seemed to remain stroke free on xarelto. She gets occasional palpitations but as far as she knows is in regular rhythm most of the time.  compliant with her bisoprolol.  A/P: continue current medications. Patient wants to consider going back to xarelto due to donut hole but we ultimately opted to wait until out of donut hole again in January befor emaking decision-

## 2016-04-28 NOTE — Progress Notes (Signed)
Subjective:  [name] is a 82 y.o. year old very pleasant female patient who presents for/with See problem oriented charting ROS- no shortness of breath, continued back pain, no recent falls, no abdominal pain. some increased edema and mild increase in weight.see any ROS included in HPI as well.   Past Medical History-  Patient Active Problem List   Diagnosis Date Noted  . Diastolic CHF ([place]) A999333    Priority: High  . Acute respiratory failure with hypoxia ([place]) 11/19/2015    Priority: High  . History of cardioembolic stroke [id number]    Priority: High  . Spinal stenosis, lumbar region, with neurogenic claudication 05/09/2014    Priority: High  . History of Focal seizure ([place]) 06/16/2013    Priority: High  . TIA (transient ischemic attack) 05/31/2013    Priority: High  . History of Large B-cell lymphoma ([place]) 02/18/2012    Priority: High  . Atrial fibrillation ([place]) 12/14/2008    Priority: High  . Bacteremia due to Streptococcus 11/21/2015    Priority: Medium  . Cellulitis of leg, right 11/20/2015    Priority: Medium  . Diverticulitis of colon 09/26/2014    Priority: Medium  . CKD (chronic kidney disease), stage III 09/26/2014    Priority: Medium  . Laryngeal carcinoma ([place])     Priority: Medium  . Anxiety state 10/20/2008    Priority: Medium  . Depression 10/20/2008    Priority: Medium  . COPD (chronic obstructive pulmonary disease) ([place]) 03/26/2008    Priority: Medium  . Hyperlipidemia 03/18/2007    Priority: Medium  . Essential hypertension 03/18/2007    Priority: Medium  . Weakness generalized 06/30/2013    Priority: Low  . Bilateral hand pain 01/09/2013    Priority: Low    Medications- reviewed and updated Current Outpatient Prescriptions  Medication Sig Dispense Refill  . acetaminophen (TYLENOL) 650 MG CR tablet Take 650 mg by mouth every 8 (eight) hours as needed for pain. Reported on 12/09/2015    . ALPRAZolam (XANAX) 0.25 MG tablet Take 1  tablet (0.25 mg total) by mouth once as needed for anxiety. 15 tablet 0  . atorvastatin (LIPITOR) 20 MG tablet TAKE ONE TABLET BY MOUTH ONCE DAILY 30 tablet 5  . cholecalciferol (VITAMIN D) 1000 UNITS tablet Take 1,000 Units by mouth daily.    Marland Kitchen gabapentin (NEURONTIN) 100 MG capsule Take 1 capsule (100 mg total) by mouth 3 (three) times daily. 90 capsule 1  . Glucosamine HCl (GLUCOSAMINE PO) Take 1 tablet by mouth daily.     Marland Kitchen levETIRAcetam (KEPPRA) 750 MG tablet Take 750 mg by mouth 2 (two) times daily.    Marland Kitchen loperamide (IMODIUM) 2 MG capsule Take 2 mg by mouth daily as needed for diarrhea or loose stools.     Alveda Reasons 15 MG TABS tablet TAKE ONE TABLET BY MOUTH ONCE DAILY WITH  SUPPER 90 tablet 3  . bisoprolol (ZEBETA) 5 MG tablet Take 1 tablet (5 mg total) by mouth daily. 30 tablet 5  . diclofenac sodium (VOLTAREN) 1 % GEL Apply 2 g topically 4 (four) times daily. 1 Tube 1  . furosemide (LASIX) 20 MG tablet Take 1 tablet (20 mg total) by mouth daily as needed for edema. 30 tablet 2   No current facility-administered medications for this visit.     Objective: BP 134/88   Pulse 82   Temp 97.8 F (36.6 C) (Oral)   Wt 193 lb 12.8 oz (87.9 kg)   SpO2 92%  BMI 32.75 kg/m  Gen: NAD, resting comfortably, appears stated age CV: RRR no murmurs rubs or gallops Lungs: CTAB no crackles, wheeze, rhonchi Abdomen: soft/nontender/nondistended/normal bowel sounds. obese Ext: 1+ edema Skin: warm, dry, some venous stasis changes on legs  Assessment/Plan:  Diastolic CHF ([facility]) S: weight up 1 lb. Edema slightly up. 20mg  lasix has been forgetting to take A/P: we discussed taking medicatoin every day for 53days when swelling worsens- chang eto prn for weight gain or edema. Monitor weights   Atrial fibrillation ([place]) S: patient has seemed to remain stroke free on xarelto. She gets occasional palpitations but as far as she knows is in regular rhythm most of the time.  compliant with her bisoprolol.   A/P: continue current medications. Patient wants to consider going back to xarelto due to donut hole but we ultimately opted to wait until out of donut hole again in January befor emaking decision-  Hyperlipidemia S: reasonably controlled on atorvastatin 20mg . No myalgias.  Lab Results  Component Value Date   CHOL 170 08/07/2014   HDL 33 (L) 08/07/2014   LDLCALC 90 08/07/2014   LDLDIRECT 100.9 02/13/2010   TRIG 235 (H) 08/07/2014   CHOLHDL 5.2 08/07/2014   A/P: consider updating at least direct LDL next visit  Declines bone density   Return in about 3 months (around 07/28/2016).  Orders Placed This Encounter  Procedures  . Flu vaccine HIGH DOSE PF    Meds ordered this encounter  Medications  . bisoprolol (ZEBETA) 5 MG tablet    Sig: Take 1 tablet (5 mg total) by mouth daily.    Dispense:  30 tablet    Refill:  5  . furosemide (LASIX) 20 MG tablet    Sig: Take 1 tablet (20 mg total) by mouth daily as needed for edema.    Dispense:  30 tablet    Refill:  2    Return precautions advised.  [name], MD

## 2016-04-28 NOTE — Patient Instructions (Addendum)
Let's let you get into next year and see what cost does before making a change to coumadin since you have done so well  Appear to be in normal rhythm today  Take lasix for next 3 days since swelling up some. Take if swelling increases or if weight goes up 3 lbs in a day or 5 lbs in 3 days. Weigh yourself daily at same time to [facility] Maintenance Due  Topic Date Due  . Bone density- declined for now 03/11/1995

## 2016-05-12 ENCOUNTER — Encounter: Payer: Self-pay | Admitting: Neurology

## 2016-05-12 ENCOUNTER — Ambulatory Visit (INDEPENDENT_AMBULATORY_CARE_PROVIDER_SITE_OTHER): Payer: Medicare Other | Admitting: Neurology

## 2016-05-12 VITALS — BP 124/64 | HR 80 | Ht 64.0 in | Wt 194.0 lb

## 2016-05-12 DIAGNOSIS — G40109 Localization-related (focal) (partial) symptomatic epilepsy and epileptic syndromes with simple partial seizures, not intractable, without status epilepticus: Secondary | ICD-10-CM

## 2016-05-12 DIAGNOSIS — M48062 Spinal stenosis, lumbar region with neurogenic claudication: Secondary | ICD-10-CM | POA: Diagnosis not present

## 2016-05-12 MED ORDER — PREGABALIN 75 MG PO CAPS: 75.0000 mg | ORAL_CAPSULE | Freq: Two times a day (BID) | ORAL | 5 refills | Status: DC

## 2016-05-12 NOTE — Patient Instructions (Signed)
1.  Continue levetiracetam 500mg  twice daily 2.  For back/leg pain, we will try Lyrica 75mg  twice daily. 3.  Follow up in 6 months.

## 2016-05-12 NOTE — Progress Notes (Signed)
NEUROLOGY FOLLOW UP OFFICE NOTE  [name] [id number]  HISTORY OF PRESENT ILLNESS: [name] is an 82 year old right-handed woman with history of hypertension, atrial fibrillation, peripheral vascular disease, anxiety, depression, B Cell lymphoma status post chemotherapy, and COPD who follows up for recurrent aphasia, cardio-embolic stroke and symptomatic seizure secondary to stroke, and lumbar stenosis.  She is accompanied by her husband who provides history.   UPDATE: She is taking Keppra 750mg  twice daily.  She has not had recurrent spells.   Lumbar Stenosis:  She takes gabapentin at night.  It causes drowsiness during the day.  She did not start Lyrica.  She has good days and bad days.     HISTORY: Since childhood, she has episodes of numbness in either of both hands and inability to get words out.  It lasts for about 2 minutes.  It should be known that she has had these episodes since childhood.  Sometimes it was associated with headache.  She had them about twice a year until young adulthood, when they resolved.  Last fall, she had several severe episodes and was started on Keppra at that time, for possible seizures.   She has had several episodes of confusion and aphasia in October and November of 2014, associated with stroke and presumed seizure.  On 05/26/13, MRI of the brain was performed, which revealed an acute lacunar infarct in the left anterior corona radiata and lentiform nucleus.  CTA of the head revealed atherosclerotic changes in the cavernous carotid arteries without significant stenosis.  CTA of neck revealed focal 50% stenosis of mid right ICA.  She was switched from [name] to Eliquis.  She was readmitted to the hospital on 05/31/13 with recurrent aphasia.  She had trouble getting her words out and then couldn't speak at all.  Repeat MRI of brain revealed new punctate infarct in the left posterior corona radiata, the same location as previous infarct. No  changes in management were made.  She was readmitted to the hospital on 06/07/13 for altered mental status.  EEG was performed, showing left fronto-parieto-temporal theta slowing.  Due to possibility of seizure, she was started on Keppra 250mg  BID.  No change made to Eliquis.  She presented to the ED on 06/16/13 after waking up in the morning with expressive aphasia and right-sided weakness.  Symptoms improved in the ED.  CT head revealed prior lacunar infarct in left lentiform nucleus and corona radiata, but no new abnormalities.  It was suspected that she may have had another seizure and her Keppra was increased to 500mg  BID.  She was admitted to the hospital again on 06/21/13 again for expressive aphasia.  MRI brain revealed 2 new punctate infarcts in the bilateral hemispheres at level of centrum semiovale.  Eliquis was changed to Xarelto. Keppra was increased to 750mg  BID.     She presented to the ED on 08/05/14 for recurrent episode of aphasia, lasting 1.5 hours.  This was more significant than last year.  She described it as knowing what she wanted to say but having trouble getting the words out.  She had trouble writing as well.  MRI of brain revealed no acute infarct.  She had an episode two days prior, lasting 1 hour.  Carotid doppler revealed no hemodynamically significant ICA stenosis.  Lipid panel showed cholesterol of 170, triglycerides of 235, HDL 33 and LDL 90.   Lumbar stenosis:  Both epidural injections and gabapentin have been ineffective.  She has had worsening back  pain with neurogenic claudication.  She is unable to ambulate without a walker.  She reportedly had a repeat MRI of the lumbar spine which showed progression of stenosis and degeneration.  She saw a neurosurgeon, Dr. Ronnald Ramp, who would like to perform surgery.  I did inform them that discontinuing Xarelto for surgery would increase risk for stroke.     Retinal migraines:  Of note, she has longstanding history of migraines, described  as scotomas, fortification spectrum and flashing lights in the right eye.  There is no associated headache.   She takes Keppra 750mg  twice daily, Xarelto, Zebeta, Lipitor  PAST MEDICAL HISTORY: Past Medical History:  Diagnosis Date  . ABDOMINAL INCISIONAL HERNIA 04/03/2010      . Anxiety   . Arthritis    "lots; hands" (05/26/2013)  . Blood transfusion    "w/1st miscarriage" (05/26/2013)  . COPD (chronic obstructive pulmonary disease) ([place])   . Coronary artery disease   . Depression   . Diverticulitis of colon with hemorrhage 07/24/2008  . DVT (deep venous thrombosis) ([place]) 1952   "after I had my first child" (05/26/2013)  . Exertional shortness of breath   . Family history of anesthesia complication    "sisters also get sick as a dog" (05/26/2013)  . GERD 05/03/2007      . GERD (gastroesophageal reflux disease)   . H/O hiatal hernia   . HCAP (healthcare-associated pneumonia) 06/26/2013  . Heart murmur    "when I was a child" (05/26/2013)  . History of TIAs   . Hyperlipidemia   . Hypertension   . Laryngeal carcinoma ([place]) hx of ca   remotely with resection and xrt/chronic hoarsemenss  . Lymphona, mantle cell, inguinal region/lower limb ([place])    "large c-cell" (05/26/2013)  . OSTEOARTHRITIS 05/03/2007  . PEPTIC ULCER DISEASE 07/06/2008  . Pneumonia 2013   "once" (05/26/2013)  . PUD (peptic ulcer disease)   . PVD (peripheral vascular disease) ([place])   . Seizures ([place])   . Stroke ([place]) 05/26/2013   "very mild; affected my speech for a while" (05/26/2013)  . VARICOSE VEINS LOWER EXTREMITIES W/INFLAMMATION 01/15/2009    MEDICATIONS: Current Outpatient Prescriptions on File Prior to Visit  Medication Sig Dispense Refill  . acetaminophen (TYLENOL) 650 MG CR tablet Take 650 mg by mouth every 8 (eight) hours as needed for pain. Reported on 12/09/2015    . ALPRAZolam (XANAX) 0.25 MG tablet Take 1 tablet (0.25 mg total) by mouth once as needed for anxiety. 15 tablet 0  . atorvastatin  (LIPITOR) 20 MG tablet TAKE ONE TABLET BY MOUTH ONCE DAILY 30 tablet 5  . bisoprolol (ZEBETA) 5 MG tablet Take 1 tablet (5 mg total) by mouth daily. 30 tablet 5  . cholecalciferol (VITAMIN D) 1000 UNITS tablet Take 1,000 Units by mouth daily.    . diclofenac sodium (VOLTAREN) 1 % GEL Apply 2 g topically 4 (four) times daily. 1 Tube 1  . furosemide (LASIX) 20 MG tablet Take 1 tablet (20 mg total) by mouth daily as needed for edema. 30 tablet 2  . gabapentin (NEURONTIN) 100 MG capsule Take 1 capsule (100 mg total) by mouth 3 (three) times daily. 90 capsule 1  . Glucosamine HCl (GLUCOSAMINE PO) Take 1 tablet by mouth daily.     Marland Kitchen levETIRAcetam (KEPPRA) 750 MG tablet Take 750 mg by mouth 2 (two) times daily.    Marland Kitchen loperamide (IMODIUM) 2 MG capsule Take 2 mg by mouth daily as needed for diarrhea or loose stools.     Marland Kitchen  XARELTO 15 MG TABS tablet TAKE ONE TABLET BY MOUTH ONCE DAILY WITH  SUPPER 90 tablet 3   No current facility-administered medications on file prior to visit.     ALLERGIES: Allergies  Allergen Reactions  . Ativan [Lorazepam] Other (See Comments)    Severe hallucinations   . Propoxyphene Hcl   . Codeine Itching  . Penicillins Rash  . Vancomycin Rash    FAMILY HISTORY: Family History  Problem Relation Age of Onset  . Colon cancer Sister     colon  . Hyperlipidemia Mother   . Stroke Mother   . Cancer Father     mesothelioma    SOCIAL HISTORY: Social History   Social History  . Marital status: Married    Spouse name: N/A  . Number of children: 6  . Years of education: N/A   Occupational History  . retired Retired   Social History Main Topics  . Smoking status: Former Smoker    Packs/day: 2.50    Years: 37.00    Types: Cigarettes    Quit date: 09/05/1984  . Smokeless tobacco: Never Used  . Alcohol use No  . Drug use: No  . Sexual activity: No   Other Topics Concern  . Not on file   Social History Narrative   Married (husband patient of Dr. Yong Channel). 6  children (lost 2 for total 8). 15 grandkids. 6 greatgrandchildren.    Lives with husband. Husband drives. Patient does not drive.       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     REVIEW OF SYSTEMS: Constitutional: No fevers, chills, or sweats, no generalized fatigue, change in appetite Eyes: No visual changes, double vision, eye pain Ear, nose and throat: No hearing loss, ear pain, nasal congestion, sore throat Cardiovascular: No chest pain, palpitations Respiratory:  No shortness of breath at rest or with exertion, wheezes GastrointestinaI: No nausea, vomiting, diarrhea, abdominal pain, fecal incontinence Genitourinary:  No dysuria, urinary retention or frequency Musculoskeletal:  Back and leg pain Integumentary: No rash, pruritus, skin lesions Neurological: as above Psychiatric: No insomnia Endocrine: No palpitations, fatigue, diaphoresis, mood swings, change in appetite, change in weight, increased thirst Hematologic/Lymphatic:  No purpura, petechiae. Allergic/Immunologic: no itchy/runny eyes, nasal congestion, recent allergic reactions, rashes  PHYSICAL EXAM: Vitals:   05/12/16 1407  BP: 124/64  Pulse: 80   General: No acute distress.  Patient appears well-groomed.  normal body habitus. Head:  Normocephalic/atraumatic Eyes:  Fundi examined but not visualized Neck: supple, no paraspinal tenderness, full range of motion Heart:  Regular rate and rhythm Lungs:  Clear to auscultation bilaterally Back: No paraspinal tenderness Neurological Exam: alert and oriented to person, place, and time. Attention span and concentration intact, recent and remote memory intact, fund of knowledge intact.  Speech fluent and not dysarthric, language intact.  CN II-XII intact. Fundi not visualized on inspection.  Bulk and tone normal, muscle strength 5/5 throughout.  Finger to nose testing with postural and kinetic tremor but no dysmetria.   Unsteady wide-based stance.  Difficult to walk and cautious when taking short strides.  IMPRESSION: 1.  Recurrent episodes of aphasia.  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  Lumbar spinal stenosis with neurogenic claudication.    PLAN: Continue Keppra 500mg  twice daily She is willing to try Lyrica 75mg  twice daily Follow up in 6 months.  25 minutes spent face to face with  patient, over 50% spent counseling.  Metta Clines, DO  CC:  [name], MD

## 2016-05-18 ENCOUNTER — Other Ambulatory Visit: Payer: Self-pay | Admitting: Neurology

## 2016-05-18 MED ORDER — LEVETIRACETAM 500 MG PO TABS: 500.0000 mg | ORAL_TABLET | Freq: Two times a day (BID) | ORAL | 3 refills | Status: DC

## 2016-05-18 NOTE — Telephone Encounter (Signed)
Continue Keppra 500 mg twice daily 

## 2016-06-23 ENCOUNTER — Telehealth: Payer: Self-pay | Admitting: Family Medicine

## 2016-06-23 NOTE — Telephone Encounter (Signed)
Pt has been sch

## 2016-06-23 NOTE — Telephone Encounter (Signed)
Yes

## 2016-06-23 NOTE — Telephone Encounter (Signed)
Pt states she has a bad cough, bruised from a fall several days ago, and states dr hunter told her to let him know when she needs to come in.  Is it ok to work this pt in tomorrow?

## 2016-06-24 ENCOUNTER — Encounter: Payer: Self-pay | Admitting: Family Medicine

## 2016-06-24 ENCOUNTER — Ambulatory Visit (INDEPENDENT_AMBULATORY_CARE_PROVIDER_SITE_OTHER): Payer: Medicare Other | Admitting: Family Medicine

## 2016-06-24 ENCOUNTER — Other Ambulatory Visit: Payer: Self-pay | Admitting: Family Medicine

## 2016-06-24 VITALS — BP 96/58 | HR 85 | Temp 98.5°F | Wt 195.6 lb

## 2016-06-24 DIAGNOSIS — J441 Chronic obstructive pulmonary disease with (acute) exacerbation: Secondary | ICD-10-CM

## 2016-06-24 DIAGNOSIS — I5032 Chronic diastolic (congestive) heart failure: Secondary | ICD-10-CM

## 2016-06-24 MED ORDER — ALBUTEROL SULFATE HFA 108 (90 BASE) MCG/ACT IN AERS: 2.0000 | INHALATION_SPRAY | Freq: Four times a day (QID) | RESPIRATORY_TRACT | 0 refills | Status: DC | PRN

## 2016-06-24 MED ORDER — DOXYCYCLINE HYCLATE 100 MG PO TABS: 100.0000 mg | ORAL_TABLET | Freq: Two times a day (BID) | ORAL | 0 refills | Status: DC

## 2016-06-24 MED ORDER — PREDNISONE 20 MG PO TABS: ORAL_TABLET | ORAL | 0 refills | Status: DC

## 2016-06-24 NOTE — Progress Notes (Signed)
Subjective:  [name] is a 82 y.o. year old very pleasant female patient who presents for/with See problem oriented charting ROS- admits to increased SOB, coughing up white sputum more than normal, wheezing. No chest pain.see any ROS included in HPI as well.   Past Medical History-  Patient Active Problem List   Diagnosis Date Noted  . Diastolic CHF ([place]) A999333    Priority: High  . Acute respiratory failure with hypoxia ([place]) 11/19/2015    Priority: High  . History of cardioembolic stroke [id number]    Priority: High  . Spinal stenosis, lumbar region, with neurogenic claudication 05/09/2014    Priority: High  . History of Focal seizure ([place]) 06/16/2013    Priority: High  . TIA (transient ischemic attack) 05/31/2013    Priority: High  . History of Large B-cell lymphoma ([place]) 02/18/2012    Priority: High  . Atrial fibrillation ([place]) 12/14/2008    Priority: High  . Bacteremia due to Streptococcus 11/21/2015    Priority: Medium  . Cellulitis of leg, right 11/20/2015    Priority: Medium  . Diverticulitis of colon 09/26/2014    Priority: Medium  . CKD (chronic kidney disease), stage III 09/26/2014    Priority: Medium  . Laryngeal carcinoma ([place])     Priority: Medium  . Anxiety state 10/20/2008    Priority: Medium  . Depression 10/20/2008    Priority: Medium  . COPD (chronic obstructive pulmonary disease) ([place]) 03/26/2008    Priority: Medium  . Hyperlipidemia 03/18/2007    Priority: Medium  . Essential hypertension 03/18/2007    Priority: Medium  . Weakness generalized 06/30/2013    Priority: Low  . Bilateral hand pain 01/09/2013    Priority: Low    Medications- reviewed and updated Current Outpatient Prescriptions  Medication Sig Dispense Refill  . acetaminophen (TYLENOL) 650 MG CR tablet Take 650 mg by mouth every 8 (eight) hours as needed for pain. Reported on 12/09/2015    . ALPRAZolam (XANAX) 0.25 MG tablet Take 1 tablet (0.25 mg total) by mouth once as  needed for anxiety. 15 tablet 0  . atorvastatin (LIPITOR) 20 MG tablet TAKE ONE TABLET BY MOUTH ONCE DAILY 30 tablet 5  . bisoprolol (ZEBETA) 5 MG tablet Take 1 tablet (5 mg total) by mouth daily. 30 tablet 5  . cholecalciferol (VITAMIN D) 1000 UNITS tablet Take 1,000 Units by mouth daily.    . diclofenac sodium (VOLTAREN) 1 % GEL Apply 2 g topically 4 (four) times daily. 1 Tube 1  . furosemide (LASIX) 20 MG tablet Take 1 tablet (20 mg total) by mouth daily as needed for edema. 30 tablet 2  . gabapentin (NEURONTIN) 100 MG capsule Take 1 capsule (100 mg total) by mouth 3 (three) times daily. 90 capsule 1  . Glucosamine HCl (GLUCOSAMINE PO) Take 1 tablet by mouth daily.     Marland Kitchen levETIRAcetam (KEPPRA) 500 MG tablet Take 1 tablet (500 mg total) by mouth 2 (two) times daily. 60 tablet 3  . levETIRAcetam (KEPPRA) 750 MG tablet TAKE ONE TABLET BY MOUTH TWICE DAILY 60 tablet 5  . loperamide (IMODIUM) 2 MG capsule Take 2 mg by mouth daily as needed for diarrhea or loose stools.     . pregabalin (LYRICA) 75 MG capsule Take 1 capsule (75 mg total) by mouth 2 (two) times daily. 60 capsule 5  . XARELTO 15 MG TABS tablet TAKE ONE TABLET BY MOUTH ONCE DAILY WITH  SUPPER 90 tablet 3   No current facility-administered  medications for this visit.     Objective: BP (!) 96/58 (BP Location: Left Arm, Patient Position: Sitting, Cuff Size: Normal)   Pulse 85   Temp 98.5 F (36.9 C) (Oral)   Wt 195 lb 9.6 oz (88.7 kg)   SpO2 (!) 88%   BMI 33.57 kg/m  Gen: , appears more fatigued than prior visits, not lightheaded with standing CV: RRR no murmurs rubs or gallops Lungs: CTAB no crackles, wheeze at present Ext: no edema on left, on right mild edema with extensive bruising from recent fall Neuro: walks with walker  Assessment/Plan:  COPD exacerbation S: 1 month symptoms, worse over 3 days- coughing up white sputum, wheezing more, more sob. Known COPD- taking. Took a lasix this morning just in case a fluid  issue- but no recent weigh tgain or edema (other than from recent fall) A/P:Oxygen 88- lower than normal- has oxygen at home available and advised to target 88-92% (also has pulse ox). Will treat as copd exacerbation with doxycycline and prednisone though odd not wheezing very much today. Strict return precautions given and follow up next Tuesday planned. Suspect if not improving would need x-ray. I do nto think this is fluid overload- weight up slightly but no edema other than bruised leg( fell on loose/upturned carpet which has since been fixed).   Also not lightheaded- patient knows if this develops to seek care as well with BP running lower.   Meds ordered this encounter  Medications  . predniSONE (DELTASONE) 20 MG tablet    Sig: Take 2 pills for 3 days, 1 pill for 4 days    Dispense:  10 tablet    Refill:  0  . doxycycline (VIBRA-TABS) 100 MG tablet    Sig: Take 1 tablet (100 mg total) by mouth 2 (two) times daily.    Dispense:  14 tablet    Refill:  0  . albuterol (PROVENTIL HFA;VENTOLIN HFA) 108 (90 Base) MCG/ACT inhaler    Sig: Inhale 2 puffs into the lungs every 6 (six) hours as needed for wheezing or shortness of breath.    Dispense:  1 Inhaler    Refill:  0    Return precautions advised.  [name], MD

## 2016-06-24 NOTE — Telephone Encounter (Signed)
Medication was called in to [facility].

## 2016-06-24 NOTE — Progress Notes (Signed)
Pre visit review using our clinic review tool, if applicable. No additional management support is needed unless otherwise documented below in the visit note. 

## 2016-06-24 NOTE — Progress Notes (Signed)
Spoke to patient verbalize understanding that Dr Yong Channel send the Albuterol prescription to pharmacy and that she needs to use at least every 6-8 hours for the next 24 hours then as needed.

## 2016-06-24 NOTE — Telephone Encounter (Signed)
Medication was called in to [place].

## 2016-06-24 NOTE — Telephone Encounter (Signed)
Pt requesting refill on Alprazolam, okay to fill?

## 2016-06-24 NOTE — Patient Instructions (Addendum)
Oxygen lower than normal- wear oxygen at home to keep oxygen between 88-92%- likely need low level at least at night  Prednisone to reduce inflammation. DOxycycline to treat infection. Schedule to see me Tuesday the 28th at 1pm for a recheck  If worsening symptoms despite treatment- need to go to the hospital. Including if you get lightheaded- blood pressure lower than normal for you today  Bruise will take a long time to heal- 2 months is my guess

## 2016-06-24 NOTE — Telephone Encounter (Signed)
Yes thanks 

## 2016-06-30 ENCOUNTER — Ambulatory Visit (INDEPENDENT_AMBULATORY_CARE_PROVIDER_SITE_OTHER)
Admission: RE | Admit: 2016-06-30 | Discharge: 2016-06-30 | Disposition: A | Discharge status: Home / Self Care | Payer: Medicare Other | Source: Ambulatory Visit | Attending: Family Medicine | Admitting: Family Medicine

## 2016-06-30 ENCOUNTER — Encounter: Payer: Self-pay | Admitting: Family Medicine

## 2016-06-30 ENCOUNTER — Ambulatory Visit (INDEPENDENT_AMBULATORY_CARE_PROVIDER_SITE_OTHER): Payer: Medicare Other | Admitting: Family Medicine

## 2016-06-30 VITALS — BP 138/72 | HR 89 | Temp 97.8°F | Ht 64.0 in | Wt 194.8 lb

## 2016-06-30 DIAGNOSIS — R05 Cough: Secondary | ICD-10-CM

## 2016-06-30 DIAGNOSIS — R059 Cough, unspecified: Secondary | ICD-10-CM

## 2016-06-30 DIAGNOSIS — J441 Chronic obstructive pulmonary disease with (acute) exacerbation: Secondary | ICD-10-CM

## 2016-06-30 DIAGNOSIS — I700 Atherosclerosis of aorta: Secondary | ICD-10-CM | POA: Insufficient documentation

## 2016-06-30 NOTE — Progress Notes (Signed)
Pre visit review using our clinic review tool, if applicable. No additional management support is needed unless otherwise documented below in the visit note. 

## 2016-06-30 NOTE — Progress Notes (Signed)
Subjective:  [name] is a 82 y.o. year old very pleasant female patient who presents for/with See problem oriented charting ROS- admits to improving SOB after treatment, coughing up white sputum- now getting closer to normal levels. Continues to wheeze. No chest pain.  Past Medical History-  Patient Active Problem List   Diagnosis Date Noted  . Diastolic CHF ([place]) A999333    Priority: High  . Acute respiratory failure with hypoxia ([place]) 11/19/2015    Priority: High  . History of cardioembolic stroke [id number]    Priority: High  . Spinal stenosis, lumbar region, with neurogenic claudication 05/09/2014    Priority: High  . History of Focal seizure ([place]) 06/16/2013    Priority: High  . TIA (transient ischemic attack) 05/31/2013    Priority: High  . History of Large B-cell lymphoma ([place]) 02/18/2012    Priority: High  . Atrial fibrillation ([place]) 12/14/2008    Priority: High  . Bacteremia due to Streptococcus 11/21/2015    Priority: Medium  . Cellulitis of leg, right 11/20/2015    Priority: Medium  . Diverticulitis of colon 09/26/2014    Priority: Medium  . CKD (chronic kidney disease), stage III 09/26/2014    Priority: Medium  . Laryngeal carcinoma ([place])     Priority: Medium  . Anxiety state 10/20/2008    Priority: Medium  . Depression 10/20/2008    Priority: Medium  . COPD (chronic obstructive pulmonary disease) ([place]) 03/26/2008    Priority: Medium  . Hyperlipidemia 03/18/2007    Priority: Medium  . Essential hypertension 03/18/2007    Priority: Medium  . Weakness generalized 06/30/2013    Priority: Low  . Bilateral hand pain 01/09/2013    Priority: Low    Medications- reviewed and updated Current Outpatient Prescriptions  Medication Sig Dispense Refill  . acetaminophen (TYLENOL) 650 MG CR tablet Take 650 mg by mouth every 8 (eight) hours as needed for pain. Reported on 12/09/2015    . albuterol (PROVENTIL HFA;VENTOLIN HFA) 108 (90 Base) MCG/ACT inhaler  Inhale 2 puffs into the lungs every 6 (six) hours as needed for wheezing or shortness of breath. 1 Inhaler 0  . ALPRAZolam (XANAX) 0.25 MG tablet TAKE ONE TABLET BY MOUTH ONCE DAILY AS NEEDED FOR ANXIETY. 15 tablet 0  . atorvastatin (LIPITOR) 20 MG tablet TAKE ONE TABLET BY MOUTH ONCE DAILY 30 tablet 5  . bisoprolol (ZEBETA) 5 MG tablet Take 1 tablet (5 mg total) by mouth daily. 30 tablet 5  . cholecalciferol (VITAMIN D) 1000 UNITS tablet Take 1,000 Units by mouth daily.    . diclofenac sodium (VOLTAREN) 1 % GEL Apply 2 g topically 4 (four) times daily. 1 Tube 1  . furosemide (LASIX) 20 MG tablet Take 1 tablet (20 mg total) by mouth daily as needed for edema. 30 tablet 2  . gabapentin (NEURONTIN) 100 MG capsule Take 1 capsule (100 mg total) by mouth 3 (three) times daily. 90 capsule 1  . Glucosamine HCl (GLUCOSAMINE PO) Take 1 tablet by mouth daily.     Marland Kitchen levETIRAcetam (KEPPRA) 500 MG tablet Take 1 tablet (500 mg total) by mouth 2 (two) times daily. 60 tablet 3  . levETIRAcetam (KEPPRA) 750 MG tablet TAKE ONE TABLET BY MOUTH TWICE DAILY 60 tablet 5  . loperamide (IMODIUM) 2 MG capsule Take 2 mg by mouth daily as needed for diarrhea or loose stools.     . pregabalin (LYRICA) 75 MG capsule Take 1 capsule (75 mg total) by mouth 2 (two) times  daily. 60 capsule 5  . XARELTO 15 MG TABS tablet TAKE ONE TABLET BY MOUTH ONCE DAILY WITH  SUPPER 90 tablet 3   Objective: BP 138/72 (BP Location: Left Arm, Patient Position: Sitting, Cuff Size: Large)   Pulse 89   Temp 97.8 F (36.6 C) (Oral)   Ht 5\' 4"  (1.626 m)   Wt 194 lb 12.8 oz (88.4 kg)   BMI 33.44 kg/m  Gen: NAD, appears more like herself, no lightheadedness with standing Mucous membranes are moist. CV: RRR no murmurs rubs or gallops Lungs: CTAB no crackles, wheeze at present Ext: no edema on left, on right mild edema with extensive bruising from recent fall Neuro: walks with walker  Assessment/Plan:  COPD exacerbation Cough S: Treated  with doxycycline and prednisone for a week starting about a week ago. Saturations were around 88% at that time. Thought patient had home oxygen but informs me today she was referencing in past. Since treatment, has started to feel better with less sputum production and SOB. Still some wheezing and cough does keep her up at night still. Albuterol- does not think this helps much. Oxygen today in 88-90% range when last visit persistently at 88%.  A/P: Seems to be improving but had hoped for more improvement at this point. We will get chest x-ray to rule out pneumonia. Return precautions given- will return if does not continue to improve. BP no longer 99991111 systolic and not feeling lightheaded. Do not think this is fluid overload issue with stable only mild edema on right.   Also not lightheaded- patient knows if this develops to seek care as well with BP running lower.   Orders Placed This Encounter  Procedures  . DG Chest 2 View    Standing Status:   Future    Standing Expiration Date:   08/30/2017    Order Specific Question:   Reason for Exam (SYMPTOM  OR DIAGNOSIS REQUIRED)    Answer:   cough for over a month, recently treated copd exacerbation for worsening in last week. rule out pneumonia. sats 88-90% but has oxygne at home    Order Specific Question:   Preferred imaging location?    Answer:   [facility]   Return precautions advised.  [name], MD

## 2016-06-30 NOTE — Patient Instructions (Signed)
Please go to [facility] (rule out pneumonemia)  - located [address] across [address] from [place] - in the basement - Hours: 8:30-5:30 PM M-F. Do not need appointment.    See me for new or worsening symptoms or if still bothering you within another 2 weeks to reevaluate. If pneumonia we will treat with a different round of antibiotics

## 2016-07-03 ENCOUNTER — Telehealth: Payer: Self-pay | Admitting: Family Medicine

## 2016-07-03 NOTE — Telephone Encounter (Signed)
° ° ° ° °  Pt call to say her cough is no better and is asking if cough syrup can be called in for her     [facility]

## 2016-07-06 MED ORDER — BENZONATATE 100 MG PO CAPS: 100.0000 mg | ORAL_CAPSULE | Freq: Two times a day (BID) | ORAL | 0 refills | Status: DC | PRN

## 2016-07-06 NOTE — Telephone Encounter (Signed)
[name] pt returned your call. °

## 2016-07-06 NOTE — Telephone Encounter (Signed)
Called in [place] - please let patient know

## 2016-07-06 NOTE — Telephone Encounter (Signed)
Called and left a voicemail message for patient asking for a return phone call

## 2016-07-06 NOTE — Telephone Encounter (Signed)
Spoke with patient who is aware that med was called in for her.

## 2016-07-20 ENCOUNTER — Other Ambulatory Visit: Payer: Self-pay | Admitting: Neurology

## 2016-07-31 ENCOUNTER — Other Ambulatory Visit: Payer: Self-pay | Admitting: Nurse Practitioner

## 2016-08-20 ENCOUNTER — Telehealth: Payer: Self-pay | Admitting: Oncology

## 2016-08-20 ENCOUNTER — Ambulatory Visit: Payer: Medicare Other | Admitting: Oncology

## 2016-08-20 NOTE — Telephone Encounter (Signed)
Patient need to reschedule due to icy roads call back number is [phone]

## 2016-08-21 ENCOUNTER — Telehealth: Payer: Self-pay | Admitting: Oncology

## 2016-08-21 ENCOUNTER — Encounter: Payer: Self-pay | Admitting: Oncology

## 2016-08-21 NOTE — Telephone Encounter (Signed)
R/s pt appt due to weather . Gave pt new appt date/time 1/22 at noon

## 2016-08-24 ENCOUNTER — Ambulatory Visit (HOSPITAL_BASED_OUTPATIENT_CLINIC_OR_DEPARTMENT_OTHER): Payer: Medicare Other | Admitting: Oncology

## 2016-08-24 ENCOUNTER — Telehealth: Payer: Self-pay | Admitting: Nurse Practitioner

## 2016-08-24 VITALS — BP 118/52 | HR 82 | Temp 98.4°F | Resp 17 | Ht 64.0 in | Wt 191.6 lb

## 2016-08-24 DIAGNOSIS — J449 Chronic obstructive pulmonary disease, unspecified: Secondary | ICD-10-CM

## 2016-08-24 DIAGNOSIS — R490 Dysphonia: Secondary | ICD-10-CM

## 2016-08-24 DIAGNOSIS — Z8572 Personal history of non-Hodgkin lymphomas: Secondary | ICD-10-CM

## 2016-08-24 DIAGNOSIS — C8510 Unspecified B-cell lymphoma, unspecified site: Secondary | ICD-10-CM

## 2016-08-24 NOTE — Progress Notes (Signed)
  [facility] OFFICE PROGRESS NOTE   Diagnosis: Non-Hodgkin's lymphoma  INTERVAL HISTORY:   Mr. Arehart returns as scheduled. No fever. Good appetite. She has chronic hoarseness. She has left-sided posterior chest discomfort in the winter. No consistent night sweats.  Objective:  Vital signs in last 24 hours:  Blood pressure (!) 118/52, pulse 82, temperature 98.4 F (36.9 C), temperature source Oral, resp. rate 17, height 5\' 4"  (1.626 m), weight 191 lb 9.6 oz (86.9 kg), SpO2 92 %.    HEENT: Neck without mass Lymphatics: No cervical, supraclavicular, axillary, or inguinal nodes Resp: Lungs clear bilaterally, no respiratory distress Cardio: Regular rate and rhythm GI: No hepatosplenomegaly, no mass, nontender Vascular: No leg edema    Medications: I have reviewed the patient's current medications.  Assessment/Plan: 1. Non-Hodgkin's lymphoma, large B-cell lymphoma, CD20 positive involving 8 needle core biopsy of a right iliac lymph node 02/12/2012. CT of the abdomen and pelvis on 01/15/2012 showed abdominal/pelvic lymphadenopathy. Serum LDH in normal range on 02/25/2012. Staging PET scan 02/25/2012 with a small lymph node posteriolateral to the oropharynx on the left measuring 9 x 7 mm with SUV 5.4; large cluster of hypermetabolic lymph nodes in the right hilum with SUV 4.3; cluster of mildly enlarged hepatoduodenal ligament lymph nodes and mildly enlarged portacaval lymph node with hypermetabolic activity (SUV max 37.1); numerous borderline enlarged and mildly enlarged periaortic retroperitoneal lymph nodes with extensive hypermetabolic activity (SUV max 43.6-47.6); numerous borderline enlarged and mildly enlarged hypermetabolic lymph nodes along the pelvic sidewall bilaterally most pronounced on the right with a right external iliac lymph node measuring 2.9 x 4.1 cm (SUV max 52.5). Bone marrow aspiration/biopsy 03/09/2012 negative for involvement with lymphoma. Status post  cycle 1 of CHOP-Rituxan on 03/15/2012. She completed cycle 3 on 05/04/2012 and cycle 4 on 05/25/2012. Restaging PET scan on 06/10/2012 was markedly improved with resolution of the thoracic and retroperitoneal adenopathy and near complete resolution of porta hepatis adenopathy. She completed cycle 6 of CHOP/Rituxan on 07/06/2012. 2. History of night sweats likely secondary to #1. 3. Rectal bleeding June 2013 status post negative colonoscopy 01/28/2012 and upper endoscopy 02/11/2012. The bleeding was likely related to hemorrhoids. 4. COPD. 5. Remote history of larynx cancer. 6. Status post Port-A-Cath placement 03/09/2012. Removed 09/13/2012. 7. 2-D echo 03/11/2012 with left ventricular ejection fraction 60-65%. 8. Admission with febrile neutropenia 03/22/2012 with no source for infection identified 9. Admission with fever and a chest x-ray consistent with "pneumonia"on 03/27/2012. She completed a course of Levaquin. 10. Pancytopenia following cycle 1 of CHOP-rituximab-resolved. 11. History of atrial fibrillation-maintained on xarelto 12. Numbness/tingling in the fingertips and toes. The numbness in the toes predated the start of chemotherapy.. She also reports the tingling in the fingertips was present prior to the start of chemotherapy. The vincristine was dose reduced with cycle 2. Vincristine was discontinued beginning with cycle 3. 13. Cardioembolic strokes, seizures 2014. She is followed by neurology. 51. Hospitalization with pneumonia November 2014 and April 2017      Disposition: Ms. Freundlich remains in clinical remission from non-Hodgkin's lymphoma. She would like to continue follow-up at the [facility]. She will return for an office visit in 8 months. 15 minutes were spent with the patient today. The majority of the time was used for counseling and coordination of care.  Betsy Coder, MD  08/24/2016  12:16 PM

## 2016-08-24 NOTE — Telephone Encounter (Signed)
Appointments scheduled per 1/22 LOS. Patient given AVS report and calendars with future scheduled appointments.  °

## 2016-08-27 ENCOUNTER — Encounter: Payer: Self-pay | Admitting: Family Medicine

## 2016-08-27 ENCOUNTER — Ambulatory Visit (INDEPENDENT_AMBULATORY_CARE_PROVIDER_SITE_OTHER): Payer: Medicare Other | Admitting: Family Medicine

## 2016-08-27 VITALS — BP 112/60 | HR 76 | Temp 97.9°F | Ht 64.0 in | Wt 191.6 lb

## 2016-08-27 DIAGNOSIS — I480 Paroxysmal atrial fibrillation: Secondary | ICD-10-CM

## 2016-08-27 DIAGNOSIS — E785 Hyperlipidemia, unspecified: Secondary | ICD-10-CM

## 2016-08-27 DIAGNOSIS — I5032 Chronic diastolic (congestive) heart failure: Secondary | ICD-10-CM | POA: Diagnosis not present

## 2016-08-27 DIAGNOSIS — R569 Unspecified convulsions: Secondary | ICD-10-CM | POA: Diagnosis not present

## 2016-08-27 DIAGNOSIS — J432 Centrilobular emphysema: Secondary | ICD-10-CM

## 2016-08-27 DIAGNOSIS — I700 Atherosclerosis of aorta: Secondary | ICD-10-CM | POA: Diagnosis not present

## 2016-08-27 LAB — CBC
HCT: 37.7 % (ref 36.0–46.0)
Hemoglobin: 12.4 g/dL (ref 12.0–15.0)
MCHC: 33 g/dL (ref 30.0–36.0)
MCV: 83.4 fl (ref 78.0–100.0)
PLATELETS: 211 10*3/uL (ref 150.0–400.0)
RBC: 4.52 Mil/uL (ref 3.87–5.11)
RDW: 15 % (ref 11.5–15.5)
WBC: 6.6 10*3/uL (ref 4.0–10.5)

## 2016-08-27 LAB — COMPREHENSIVE METABOLIC PANEL
ALT: 8 U/L (ref 0–35)
AST: 10 U/L (ref 0–37)
Albumin: 4 g/dL (ref 3.5–5.2)
Alkaline Phosphatase: 80 U/L (ref 39–117)
BILIRUBIN TOTAL: 0.6 mg/dL (ref 0.2–1.2)
BUN: 13 mg/dL (ref 6–23)
CO2: 30 mEq/L (ref 19–32)
CREATININE: 1.12 mg/dL (ref 0.40–1.20)
Calcium: 9.5 mg/dL (ref 8.4–10.5)
Chloride: 104 mEq/L (ref 96–112)
GFR: 48.97 mL/min — ABNORMAL LOW (ref >60.00)
Glucose, Bld: 99 mg/dL (ref 70–99)
Potassium: 4.8 mEq/L (ref 3.5–5.1)
SODIUM: 140 meq/L (ref 135–145)
Total Protein: 6.8 g/dL (ref 6.0–8.3)

## 2016-08-27 LAB — LDL CHOLESTEROL, DIRECT: Direct LDL: 48 mg/dL

## 2016-08-27 NOTE — Assessment & Plan Note (Signed)
S: seizure free on keppra 750mg  BID. Prior expressive aphasia thought due to seizure over stroke. Sees Dr. Tomi Likens on regular basis as well A/P: continue current medication

## 2016-08-27 NOTE — Progress Notes (Signed)
Pre visit review using our clinic review tool, if applicable. No additional management support is needed unless otherwise documented below in the visit note. 

## 2016-08-27 NOTE — Assessment & Plan Note (Addendum)
S: not using oxygen as long as fluid status controlled. No recent SOb or wheezing- does not use inhaler A/P: continue without medication but will monitor. Has albuterol on hand

## 2016-08-27 NOTE — Assessment & Plan Note (Addendum)
S: stroke free on xarelto- compliant with 15mg  daily. Compliant with medicine. Occasional palpitations. Compliant with bisoprolol. GFR near 50 so that is why she is on 15mg  dose A/P: rate controlled and anticoagulated continue current medicines, though appears to be in sinus today

## 2016-08-27 NOTE — Assessment & Plan Note (Signed)
S: since september visit weight down 2 lbs. Knows to use lasix 3-5 days for weight gain or edema. Has not used since last visit and no weight gain.  A/P: well controlled recently- will keep lasix on hand

## 2016-08-27 NOTE — Patient Instructions (Addendum)
Labs before you leave  No changes in medicine today  See Dr. Yong Channel in 4 months.   Before that visit.Marland KitchenMarland Kitchen I would also like for you to sign up for an annual wellness visit with our nurse, Manuela Schwartz, who specializes in the annual wellness exam. This is a free benefit under medicare that may help [place] find additional ways to help you. Some highlights are reviewing medications, lifestyle, and doing a dementia screen.

## 2016-08-27 NOTE — Assessment & Plan Note (Signed)
S: noted CXR 06/30/16. Discussed today importance of bp and lipid control A/P: patient understands will modify risk factors as best as able. For example- continue statin and bp control

## 2016-08-27 NOTE — Progress Notes (Addendum)
Subjective:  [name] is a 83 y.o. year old very pleasant female patient who presents for/with See problem oriented charting ROS- has incontinence of stool- immodium helps, continued back pain, no fever or chills . No chest pain. Rare palpitations  Past Medical History-  Patient Active Problem List   Diagnosis Date Noted  . Diastolic CHF ([place]) A999333    Priority: High  . History of cardioembolic stroke [id number]    Priority: High  . Spinal stenosis, lumbar region, with neurogenic claudication 05/09/2014    Priority: High  . History of Focal seizure ([place]) 06/16/2013    Priority: High  . TIA (transient ischemic attack) 05/31/2013    Priority: High  . History of Large B-cell lymphoma ([place]) 02/18/2012    Priority: High  . Atrial fibrillation ([place]) 12/14/2008    Priority: High  . Bacteremia due to Streptococcus 11/21/2015    Priority: Medium  . Cellulitis of leg, right 11/20/2015    Priority: Medium  . Diverticulitis of colon 09/26/2014    Priority: Medium  . CKD (chronic kidney disease), stage III 09/26/2014    Priority: Medium  . Laryngeal carcinoma ([place])     Priority: Medium  . Anxiety state 10/20/2008    Priority: Medium  . Depression 10/20/2008    Priority: Medium  . COPD (chronic obstructive pulmonary disease) ([place]) 03/26/2008    Priority: Medium  . Hyperlipidemia 03/18/2007    Priority: Medium  . Essential hypertension 03/18/2007    Priority: Medium  . Aortic atherosclerosis ([place]) 06/30/2016    Priority: Low  . Weakness generalized 06/30/2013    Priority: Low  . Bilateral hand pain 01/09/2013    Priority: Low    Medications- reviewed and updated Current Outpatient Prescriptions  Medication Sig Dispense Refill  . acetaminophen (TYLENOL) 650 MG CR tablet Take 650 mg by mouth every 8 (eight) hours as needed for pain. Reported on 12/09/2015    . albuterol (PROVENTIL HFA;VENTOLIN HFA) 108 (90 Base) MCG/ACT inhaler Inhale 2 puffs into the lungs every 6  (six) hours as needed for wheezing or shortness of breath. 1 Inhaler 0  . ALPRAZolam (XANAX) 0.25 MG tablet TAKE ONE TABLET BY MOUTH ONCE DAILY AS NEEDED FOR ANXIETY. 15 tablet 0  . atorvastatin (LIPITOR) 20 MG tablet TAKE ONE TABLET BY MOUTH ONCE DAILY 30 tablet 5  . benzonatate (TESSALON) 100 MG capsule Take 1 capsule (100 mg total) by mouth 2 (two) times daily as needed for cough. 20 capsule 0  . bisoprolol (ZEBETA) 5 MG tablet Take 1 tablet (5 mg total) by mouth daily. 30 tablet 5  . cholecalciferol (VITAMIN D) 1000 UNITS tablet Take 1,000 Units by mouth daily.    . diclofenac sodium (VOLTAREN) 1 % GEL Apply 2 g topically 4 (four) times daily. 1 Tube 1  . furosemide (LASIX) 20 MG tablet Take 1 tablet (20 mg total) by mouth daily as needed for edema. 30 tablet 2  . gabapentin (NEURONTIN) 100 MG capsule TAKE ONE CAPSULE BY MOUTH THREE TIMES DAILY. 90 capsule 1  . levETIRAcetam (KEPPRA) 500 MG tablet Take 1 tablet (500 mg total) by mouth 2 (two) times daily. 60 tablet 3  . loperamide (IMODIUM) 2 MG capsule Take 2 mg by mouth daily as needed for diarrhea or loose stools.     . pregabalin (LYRICA) 75 MG capsule Take 1 capsule (75 mg total) by mouth 2 (two) times daily. 60 capsule 5  . XARELTO 15 MG TABS tablet TAKE ONE TABLET BY MOUTH  ONCE DAILY WITH  SUPPER 90 tablet 3   No current facility-administered medications for this visit.     Objective: BP 112/60 (BP Location: Left Arm, Patient Position: Sitting, Cuff Size: Normal)   Pulse 76   Temp 97.9 F (36.6 C) (Oral)   Ht 5\' 4"  (1.626 m)   Wt 191 lb 9.6 oz (86.9 kg)   SpO2 90%   BMI 32.89 kg/m  Gen: NAD, resting comfortably CV: RRR no murmurs rubs or gallops Lungs: CTAB no crackles, wheeze, rhonchi Abdomen: soft/nontender/nondistended/normal bowel sounds. obese.  Ext: 1+ edema down to trace Skin: warm, dry  Assessment/Plan:  Diastolic CHF ([place]) S: since september visit weight down 2 lbs. Knows to use lasix 3-5 days for weight gain  or edema. Has not used since last visit and no weight gain.  A/P: well controlled recently- will keep lasix on hand  Atrial fibrillation ([place]) S: stroke free on xarelto- compliant with 15mg  daily. Compliant with medicine. Occasional palpitations. Compliant with bisoprolol. GFR near 50 so that is why she is on 15mg  dose A/P: rate controlled and anticoagulated continue current medicines, though appears to be in sinus today  History of Focal seizure ([place]) S: seizure free on keppra 750mg  BID. Prior expressive aphasia thought due to seizure over stroke. Sees Dr. Tomi Likens on regular basis as well A/P: continue current medication  Aortic atherosclerosis ([place]) S: noted CXR 06/30/16. Discussed today importance of bp and lipid control A/P: patient understands will modify risk factors as best as able. For example- continue statin and bp control   COPD (chronic obstructive pulmonary disease) ([facility]) S: not using oxygen as long as fluid status controlled. No recent SOb or wheezing- does not use inhaler A/P: continue without medication but will monitor. Has albuterol on hand  4 months Declines DEXA  Orders Placed This Encounter  Procedures  . CBC    [place]  . Comprehensive metabolic panel     [facility]  . LDL cholesterol, direct    [place]    No orders of the defined types were placed in this encounter.   Return precautions advised.  [name], MD

## 2016-09-20 ENCOUNTER — Other Ambulatory Visit: Payer: Self-pay | Admitting: Neurology

## 2016-10-05 ENCOUNTER — Other Ambulatory Visit: Payer: Self-pay | Admitting: Family Medicine

## 2016-10-08 ENCOUNTER — Telehealth: Payer: Self-pay | Admitting: Family Medicine

## 2016-10-08 ENCOUNTER — Ambulatory Visit (INDEPENDENT_AMBULATORY_CARE_PROVIDER_SITE_OTHER): Payer: Medicare Other | Admitting: Family Medicine

## 2016-10-08 VITALS — BP 140/72 | HR 74 | Temp 98.0°F | Ht 64.0 in | Wt 191.0 lb

## 2016-10-08 DIAGNOSIS — R309 Painful micturition, unspecified: Secondary | ICD-10-CM | POA: Diagnosis not present

## 2016-10-08 LAB — POC URINALSYSI DIPSTICK (AUTOMATED)
BILIRUBIN UA: NEGATIVE
GLUCOSE UA: NEGATIVE
KETONES UA: NEGATIVE
Nitrite, UA: NEGATIVE
SPEC GRAV UA: 1.02
Urobilinogen, UA: 0.2
pH, UA: 6.5

## 2016-10-08 MED ORDER — CEPHALEXIN 500 MG PO CAPS: 500.0000 mg | ORAL_CAPSULE | Freq: Two times a day (BID) | ORAL | 0 refills | Status: DC

## 2016-10-08 NOTE — Progress Notes (Signed)
Subjective:  [name] is a 83 y.o. year old very pleasant female patient who presents for/with See problem oriented charting ROS- no fever,chills, nausea, vomiting. Has chronic low back pain but no worsening. Subjectively mild warm at times.    Past Medical History-  Patient Active Problem List   Diagnosis Date Noted  . Diastolic CHF ([place]) 67/20/9470    Priority: High  . History of cardioembolic stroke 96/28/3662    Priority: High  . Spinal stenosis, lumbar region, with neurogenic claudication 05/09/2014    Priority: High  . History of Focal seizure ([place]) 06/16/2013    Priority: High  . TIA (transient ischemic attack) 05/31/2013    Priority: High  . History of Large B-cell lymphoma ([place]) 02/18/2012    Priority: High  . Atrial fibrillation ([place]) 12/14/2008    Priority: High  . Bacteremia due to Streptococcus 11/21/2015    Priority: Medium  . Cellulitis of leg, right 11/20/2015    Priority: Medium  . Diverticulitis of colon 09/26/2014    Priority: Medium  . CKD (chronic kidney disease), stage III 09/26/2014    Priority: Medium  . Laryngeal carcinoma ([place])     Priority: Medium  . Anxiety state 10/20/2008    Priority: Medium  . Depression 10/20/2008    Priority: Medium  . COPD (chronic obstructive pulmonary disease) ([place]) 03/26/2008    Priority: Medium  . Hyperlipidemia 03/18/2007    Priority: Medium  . Essential hypertension 03/18/2007    Priority: Medium  . Aortic atherosclerosis ([place]) 06/30/2016    Priority: Low  . Weakness generalized 06/30/2013    Priority: Low  . Bilateral hand pain 01/09/2013    Priority: Low    Medications- reviewed and updated Current Outpatient Prescriptions  Medication Sig Dispense Refill  . acetaminophen (TYLENOL) 650 MG CR tablet Take 650 mg by mouth every 8 (eight) hours as needed for pain. Reported on 12/09/2015    . albuterol (PROVENTIL HFA;VENTOLIN HFA) 108 (90 Base) MCG/ACT inhaler Inhale 2 puffs into the lungs every 6 (six)  hours as needed for wheezing or shortness of breath. 1 Inhaler 0  . ALPRAZolam (XANAX) 0.25 MG tablet TAKE ONE TABLET BY MOUTH ONCE DAILY AS NEEDED FOR ANXIETY. 15 tablet 0  . atorvastatin (LIPITOR) 20 MG tablet TAKE ONE TABLET BY MOUTH ONCE DAILY 30 tablet 5  . benzonatate (TESSALON) 100 MG capsule Take 1 capsule (100 mg total) by mouth 2 (two) times daily as needed for cough. 20 capsule 0  . bisoprolol (ZEBETA) 5 MG tablet Take 1 tablet (5 mg total) by mouth daily. 30 tablet 5  . cholecalciferol (VITAMIN D) 1000 UNITS tablet Take 1,000 Units by mouth daily.    . diclofenac sodium (VOLTAREN) 1 % GEL Apply 2 g topically 4 (four) times daily. 1 Tube 1  . furosemide (LASIX) 20 MG tablet Take 1 tablet (20 mg total) by mouth daily as needed for edema. 30 tablet 2  . gabapentin (NEURONTIN) 100 MG capsule TAKE ONE CAPSULE BY MOUTH THREE TIMES DAILY. 90 capsule 1  . levETIRAcetam (KEPPRA) 500 MG tablet TAKE ONE TABLET BY MOUTH TWICE DAILY 60 tablet 3  . loperamide (IMODIUM) 2 MG capsule Take 2 mg by mouth daily as needed for diarrhea or loose stools.     . pregabalin (LYRICA) 75 MG capsule Take 1 capsule (75 mg total) by mouth 2 (two) times daily. 60 capsule 5  . XARELTO 15 MG TABS tablet TAKE ONE TABLET BY MOUTH ONCE DAILY WITH  SUPPER  90 tablet 3  . cephALEXin (KEFLEX) 500 MG capsule Take 1 capsule (500 mg total) by mouth 2 (two) times daily. 14 capsule 0   No current facility-administered medications for this visit.     Objective: BP 140/72 (BP Location: Left Arm, Patient Position: Sitting, Cuff Size: Large)   Pulse 74   Temp 98 F (36.7 C) (Oral)   Ht 5\' 4"  (1.626 m)   Wt 191 lb (86.6 kg)   SpO2 94%   BMI 32.79 kg/m  Gen: NAD, resting comfortably CV: RRR no murmurs rubs or gallops Lungs: CTAB no crackles, wheeze, rhonchi Abdomen: soft/nontender/nondistended/normal bowel sounds. No rebound or guarding.  No worsened pain with palpation of CVA region Ext: no edema Skin: warm, dry, no rash  on lower abdomen Neuro: walks with walker  Assessment/Plan:  Urinary pain - Plan: POCT Urinalysis Dipstick (Automated), Urine culture S: Patient started with dysuria 2 days ago. Seems to be getting worse. Polyuria but has had to take lasix in last few years so unsure if due to lasix or due to potential UTI. No worsened back pain. No abdominal pain. Rates the burning with peeing as moderate and usually only lasts for short period. No treatments tried.  A/P: Suspect UTI. Unable to urinate today- given cup and husband will bring back by for UA and culture. We will go ahead and treat regardless with keflex BID x 7 days. Adjusted down due to CKD III from QID or TID.   Orders Placed This Encounter  Procedures  . Urine culture    [facility]  . POCT Urinalysis Dipstick (Automated)    Meds ordered this encounter  Medications  . cephALEXin (KEFLEX) 500 MG capsule    Sig: Take 1 capsule (500 mg total) by mouth 2 (two) times daily.    Dispense:  14 capsule    Refill:  0   Return precautions advised.  [name], MD

## 2016-10-08 NOTE — Telephone Encounter (Signed)
Pt stated that she is having urgency and it is very painful for her to go to the bathroom she just would like to have medication.  Pt refuse to see another provider and stated that Dr. Yong Channel stated that she need to ask to speak with him and he will see her.  Pharm:  [facility] on [address]

## 2016-10-08 NOTE — Patient Instructions (Signed)
Keflex twice a day for 7 days  Please bring urine back by to lab today. This will help [place] make sure you are on the right antibiotics

## 2016-10-08 NOTE — Telephone Encounter (Signed)
Called and spoke to patient who I scheduled for 11:45 this morning

## 2016-10-08 NOTE — Progress Notes (Signed)
Pre visit review using our clinic review tool, if applicable. No additional management support is needed unless otherwise documented below in the visit note. 

## 2016-10-10 LAB — URINE CULTURE

## 2016-10-14 ENCOUNTER — Other Ambulatory Visit: Payer: Self-pay | Admitting: Family Medicine

## 2016-10-19 ENCOUNTER — Other Ambulatory Visit: Payer: Self-pay | Admitting: Neurology

## 2016-11-10 ENCOUNTER — Ambulatory Visit (INDEPENDENT_AMBULATORY_CARE_PROVIDER_SITE_OTHER): Payer: Medicare Other | Admitting: Neurology

## 2016-11-10 ENCOUNTER — Encounter: Payer: Self-pay | Admitting: Neurology

## 2016-11-10 VITALS — BP 124/70 | HR 82 | Ht 64.0 in | Wt 192.4 lb

## 2016-11-10 DIAGNOSIS — M48062 Spinal stenosis, lumbar region with neurogenic claudication: Secondary | ICD-10-CM | POA: Diagnosis not present

## 2016-11-10 DIAGNOSIS — G40109 Localization-related (focal) (partial) symptomatic epilepsy and epileptic syndromes with simple partial seizures, not intractable, without status epilepticus: Secondary | ICD-10-CM

## 2016-11-10 MED ORDER — PREGABALIN 75 MG PO CAPS: 75.0000 mg | ORAL_CAPSULE | Freq: Two times a day (BID) | ORAL | 5 refills | Status: DC

## 2016-11-10 NOTE — Progress Notes (Signed)
NEUROLOGY FOLLOW UP OFFICE NOTE  LATOYIA TECSON [id number]  HISTORY OF PRESENT ILLNESS: [name] is an 83 year old right-handed woman with history of hypertension, atrial fibrillation, peripheral vascular disease, anxiety, depression, B Cell lymphoma status post chemotherapy, and COPD who follows up for recurrent aphasia, cardio-embolic stroke and symptomatic seizure secondary to stroke, and lumbar stenosis.  She is accompanied by her husband who provides history.   UPDATE: Seizure disorder:  She is taking Keppra 750mg  twice daily.  She has not had recurrent spells.   Lumbar Stenosis:  She takes gabapentin 100mg  twice daily (three times daily causes drowsiness).  She never gave Lyrica a chance.  She has worsening bilateral leg pain.  History of cardioembolic stroke:  She takes Xarelto for secon   HISTORY: Seizure disorder: Since childhood, she has episodes of numbness in either of both hands and inability to get words out.  It lasts for about 2 minutes.  It should be known that she has had these episodes since childhood.  Sometimes it was associated with headache.  She had them about twice a year until young adulthood, when they resolved.  Last fall, she had several severe episodes and was started on Keppra at that time, for possible seizures.    Cardio-embolic stroke: She has had several episodes of confusion and aphasia in October and November of 2014, associated with stroke and presumed seizure.  On 05/26/13, MRI of the brain was performed, which revealed an acute lacunar infarct in the left anterior corona radiata and lentiform nucleus.  CTA of the head revealed atherosclerotic changes in the cavernous carotid arteries without significant stenosis.  CTA of neck revealed focal 50% stenosis of mid right ICA.  She was switched from [name] to Eliquis.  She was readmitted to the hospital on 05/31/13 with recurrent aphasia.  She had trouble getting her words out and then couldn't  speak at all.  Repeat MRI of brain revealed new punctate infarct in the left posterior corona radiata, the same location as previous infarct. No changes in management were made.  She was readmitted to the hospital on 06/07/13 for altered mental status.  EEG was performed, showing left fronto-parieto-temporal theta slowing.  Due to possibility of seizure, she was started on Keppra 250mg  BID.  No change made to Eliquis.  She presented to the ED on 06/16/13 after waking up in the morning with expressive aphasia and right-sided weakness.  Symptoms improved in the ED.  CT head revealed prior lacunar infarct in left lentiform nucleus and corona radiata, but no new abnormalities.  It was suspected that she may have had another seizure and her Keppra was increased to 500mg  BID.  She was admitted to the hospital again on 06/21/13 again for expressive aphasia.  MRI brain revealed 2 new punctate infarcts in the bilateral hemispheres at level of centrum semiovale.  Eliquis was changed to Xarelto. Keppra was increased to 750mg  BID.     She presented to the ED on 08/05/14 for recurrent episode of aphasia, lasting 1.5 hours.  This was more significant than the previous year.  She described it as knowing what she wanted to say but having trouble getting the words out.  She had trouble writing as well.  MRI of brain revealed no acute infarct.  She had an episode two days prior, lasting 1 hour.  Carotid doppler revealed no hemodynamically significant ICA stenosis.  Lipid panel showed cholesterol of 170, triglycerides of 235, HDL 33 and LDL 90.  She  takes Xarelto for secondary stroke prevention, Lipitor for hyperlipidemia, and Zebeta and Lasix for hypertension.   Lumbar stenosis:   Both epidural injections and gabapentin have been ineffective.  She has had worsening back pain with neurogenic claudication.  She is unable to ambulate without a walker.  She reportedly had a repeat MRI of the lumbar spine which showed progression of  stenosis and degeneration.  She saw a neurosurgeon, Dr. Ronnald Ramp, who would like to perform surgery.  I did inform them that discontinuing Xarelto for surgery would increase risk for stroke.     Retinal migraines:  Of note, she has longstanding history of migraines, described as scotomas, fortification spectrum and flashing lights in the right eye.  There is no associated headache.  PAST MEDICAL HISTORY: Past Medical History:  Diagnosis Date  . ABDOMINAL INCISIONAL HERNIA 04/03/2010      . Anxiety   . Arthritis    "lots; hands" (05/26/2013)  . Blood transfusion    "w/1st miscarriage" (05/26/2013)  . COPD (chronic obstructive pulmonary disease) ([place])   . Coronary artery disease   . Depression   . Diverticulitis of colon with hemorrhage 07/24/2008  . DVT (deep venous thrombosis) ([place]) 1952   "after I had my first child" (05/26/2013)  . Exertional shortness of breath   . Family history of anesthesia complication    "sisters also get sick as a dog" (05/26/2013)  . GERD 05/03/2007      . GERD (gastroesophageal reflux disease)   . H/O hiatal hernia   . HCAP (healthcare-associated pneumonia) 06/26/2013  . Heart murmur    "when I was a child" (05/26/2013)  . History of TIAs   . Hyperlipidemia   . Hypertension   . 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 Lymphona, mantle cell, inguinal region/lower limb ([place])    "large c-cell" (05/26/2013)  . OSTEOARTHRITIS 05/03/2007  . PEPTIC ULCER DISEASE 07/06/2008  . Pneumonia 2013   "once" (05/26/2013)  . PUD (peptic ulcer disease)   . PVD (peripheral vascular disease) ([place])   . Seizures ([place])   . Stroke ([place]) 05/26/2013   "very mild; affected my speech for a while" (05/26/2013)  . VARICOSE VEINS LOWER EXTREMITIES W/INFLAMMATION 01/15/2009    MEDICATIONS: Current Outpatient Prescriptions on File Prior to Visit  Medication Sig Dispense Refill  . acetaminophen (TYLENOL) 650 MG CR tablet Take 650  mg by mouth every 8 (eight) hours as needed for pain. Reported on 12/09/2015    . albuterol (PROVENTIL HFA;VENTOLIN HFA) 108 (90 Base) MCG/ACT inhaler Inhale 2 puffs into the lungs every 6 (six) hours as needed for wheezing or shortness of breath. 1 Inhaler 0  . ALPRAZolam (XANAX) 0.25 MG tablet TAKE ONE TABLET BY MOUTH ONCE DAILY AS NEEDED FOR ANXIETY. 15 tablet 0  . atorvastatin (LIPITOR) 20 MG tablet TAKE ONE TABLET BY MOUTH ONCE DAILY 30 tablet 5  . benzonatate (TESSALON) 100 MG capsule Take 1 capsule (100 mg total) by mouth 2 (two) times daily as needed for cough. 20 capsule 0  . bisoprolol (ZEBETA) 5 MG tablet Take 1 tablet (5 mg total) by mouth daily. 30 tablet 5  . cephALEXin (KEFLEX) 500 MG capsule Take 1 capsule (500 mg total) by mouth 2 (two) times daily. 14 capsule 0  . cholecalciferol (VITAMIN D) 1000 UNITS tablet Take 1,000 Units by mouth daily.    . diclofenac sodium (VOLTAREN) 1 % GEL Apply 2 g topically 4 (four) times daily.  1 Tube 1  . furosemide (LASIX) 20 MG tablet Take 1 tablet (20 mg total) by mouth daily as needed for edema. 30 tablet 2  . gabapentin (NEURONTIN) 100 MG capsule TAKE ONE CAPSULE BY MOUTH THREE TIMES DAILY 270 capsule 1  . levETIRAcetam (KEPPRA) 500 MG tablet TAKE ONE TABLET BY MOUTH TWICE DAILY 60 tablet 3  . loperamide (IMODIUM) 2 MG capsule Take 2 mg by mouth daily as needed for diarrhea or loose stools.     Alveda Reasons 15 MG TABS tablet TAKE ONE TABLET BY MOUTH ONCE DAILY WITH  SUPPER 90 tablet 3   No current facility-administered medications on file prior to visit.     ALLERGIES: Allergies  Allergen Reactions  . Ativan [Lorazepam] Other (See Comments)    Severe hallucinations   . Propoxyphene Hcl   . Codeine Itching  . Penicillins Rash  . Vancomycin Rash    FAMILY HISTORY: Family History  Problem Relation Age of Onset  . Colon cancer Sister     colon  . Hyperlipidemia Mother   . Stroke Mother   . Cancer Father     mesothelioma    SOCIAL  HISTORY: Social History   Social History  . Marital status: Married    Spouse name: N/A  . Number of children: 6  . Years of education: N/A   Occupational History  . retired Retired   Social History Main Topics  . Smoking status: Former Smoker    Packs/day: 2.50    Years: 37.00    Types: Cigarettes    Quit date: 09/05/1984  . Smokeless tobacco: Never Used  . Alcohol use No  . Drug use: No  . Sexual activity: No   Other Topics Concern  . Not on file   Social History Narrative   Married (husband patient of Dr. Yong Channel). 6 children (lost 2 for total 8). 15 grandkids. 6 greatgrandchildren.    Lives with husband. Husband drives. Patient does not drive.       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     REVIEW OF SYSTEMS: Constitutional: No fevers, chills, or sweats, no generalized fatigue, change in appetite Eyes: No visual changes, double vision, eye pain Ear, nose and throat: No hearing loss, ear pain, nasal congestion, sore throat Cardiovascular: No chest pain, palpitations Respiratory:  No shortness of breath at rest or with exertion, wheezes GastrointestinaI: No nausea, vomiting, diarrhea, abdominal pain, fecal incontinence Genitourinary:  No dysuria, urinary retention or frequency Musculoskeletal:  back pain Integumentary: No rash, pruritus, skin lesions Neurological: as above Psychiatric: No depression, insomnia, anxiety Endocrine: No palpitations, fatigue, diaphoresis, mood swings, change in appetite, change in weight, increased thirst Hematologic/Lymphatic:  No purpura, petechiae. Allergic/Immunologic: no itchy/runny eyes, nasal congestion, recent allergic reactions, rashes  PHYSICAL EXAM: Vitals:   11/10/16 1422  BP: 124/70  Pulse: 82   General: No acute distress.  Patient appears well-groomed.  normal body habitus. Head:  Normocephalic/atraumatic Eyes:  Fundi examined but not visualized Neck:  supple, no paraspinal tenderness, full range of motion Heart:  Regular rate and rhythm Lungs:  Clear to auscultation bilaterally Back: No paraspinal tenderness Neurological Exam: alert and oriented to person, place, and time. Attention span and concentration intact, recent and remote memory intact, fund of knowledge intact.  Speech fluent and not dysarthric, language intact.  CN II-XII intact. Bulk and tone normal, muscle strength 5/5 throughout.  Finger to nose  testing with postural and kinetic tremor but no dysmetria.  Unsteady wide-based stance.  Difficult to walk and cautious when taking short strides.  IMPRESSION: 1.  Recurrent episodes of aphasia.  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  Lumbar spinal stenosis with neurogenic claudication.  3.  O2 86%, which she and her husband report is baseline.  Should still follow up with PCP if worsens.  PLAN: 1.  She is willing to try Lyrica 75mg  twice daily.  2.  She will continue gabapentin 100mg  twice daily for now, since she notes improvement. 3.  She will contact us if she feels she needs more physical therapy to help with treating pain from lumbar stenosis and help with ambulation.  15 minutes spent face to face with patient, over 50% spent discussing pain management.  Metta Clines, DO  CC:  [name], MD

## 2016-11-10 NOTE — Patient Instructions (Signed)
1.  Please try the Lyrica 75mg  twice daily 2.  Continue Keppra 750mg  twice daily and gabapentin 100mg  twice daily 3.  Follow up in 9 months.

## 2016-12-31 ENCOUNTER — Telehealth: Payer: Self-pay | Admitting: Family Medicine

## 2016-12-31 ENCOUNTER — Ambulatory Visit (INDEPENDENT_AMBULATORY_CARE_PROVIDER_SITE_OTHER): Payer: Medicare Other | Admitting: Family Medicine

## 2016-12-31 ENCOUNTER — Encounter: Payer: Self-pay | Admitting: Family Medicine

## 2016-12-31 VITALS — BP 98/52 | HR 69 | Temp 98.5°F | Ht 64.0 in | Wt 197.2 lb

## 2016-12-31 DIAGNOSIS — J441 Chronic obstructive pulmonary disease with (acute) exacerbation: Secondary | ICD-10-CM

## 2016-12-31 DIAGNOSIS — J432 Centrilobular emphysema: Secondary | ICD-10-CM

## 2016-12-31 MED ORDER — ALBUTEROL SULFATE HFA 108 (90 BASE) MCG/ACT IN AERS
2.0000 | INHALATION_SPRAY | Freq: Four times a day (QID) | RESPIRATORY_TRACT | 1 refills | Status: DC | PRN
Start: 2016-12-31 — End: 2017-03-22

## 2016-12-31 MED ORDER — PREDNISONE 20 MG PO TABS: ORAL_TABLET | ORAL | 0 refills | Status: DC

## 2016-12-31 NOTE — Assessment & Plan Note (Signed)
S: Patient presents with 21 days of symptoms at least. Symptoms started with coughign up clear sputum, watery itcy eyes, wheezing, mild increase in windedness. Sick contacts: none.  Symptoms include: Increase in cough Increased shortness of breath increase in sputum production no change in sputum color- still clear Increase in frequency of wheeze.  She apparently ran out of prior albuterol   A/P: Patient presents with mild COPD exacerbation based on wheeze, mild shortness of breath. Will cover with course of prednisone for 7 days. Considered antibiotic and opted out for now- will monitor. Refilled albuterol and encouraged q6 hours over next 24 hours. I also think with her clear rhinorrhea, watery itchy eyes may have allergic element but prednisone should help that as well.

## 2016-12-31 NOTE — Telephone Encounter (Signed)
error 

## 2016-12-31 NOTE — Patient Instructions (Signed)
Prednisone for 7 days  Use inhaler every 6 hours for next 24 hours  Use lasix for next 3 days (fluid pill)  Return to see [place] if not doing better in next 7 days or if new or worsening symptoms particularly fever, shortness of breath

## 2016-12-31 NOTE — Progress Notes (Signed)
Subjective:  [name] is a 83 y.o. year old very pleasant female patient who presents for/with See problem oriented charting ROS- no fever or chills. Mild shortness of breath. No chest pain   Past Medical History-  Patient Active Problem List   Diagnosis Date Noted  . Diastolic CHF ([place]) 56/38/7564    Priority: High  . History of cardioembolic stroke 33/29/5188    Priority: High  . Spinal stenosis, lumbar region, with neurogenic claudication 05/09/2014    Priority: High  . History of Focal seizure ([place]) 06/16/2013    Priority: High  . TIA (transient ischemic attack) 05/31/2013    Priority: High  . History of Large B-cell lymphoma ([place]) 02/18/2012    Priority: High  . Atrial fibrillation ([place]) 12/14/2008    Priority: High  . Bacteremia due to Streptococcus 11/21/2015    Priority: Medium  . Cellulitis of leg, right 11/20/2015    Priority: Medium  . Diverticulitis of colon 09/26/2014    Priority: Medium  . CKD (chronic kidney disease), stage III 09/26/2014    Priority: Medium  . Laryngeal carcinoma ([place])     Priority: Medium  . Anxiety state 10/20/2008    Priority: Medium  . Depression 10/20/2008    Priority: Medium  . COPD (chronic obstructive pulmonary disease) ([place]) 03/26/2008    Priority: Medium  . Hyperlipidemia 03/18/2007    Priority: Medium  . Essential hypertension 03/18/2007    Priority: Medium  . Aortic atherosclerosis ([place]) 06/30/2016    Priority: Low  . Weakness generalized 06/30/2013    Priority: Low  . Bilateral hand pain 01/09/2013    Priority: Low    Medications- reviewed and updated Current Outpatient Prescriptions  Medication Sig Dispense Refill  . acetaminophen (TYLENOL) 650 MG CR tablet Take 650 mg by mouth every 8 (eight) hours as needed for pain. Reported on 12/09/2015    . albuterol (PROVENTIL HFA;VENTOLIN HFA) 108 (90 Base) MCG/ACT inhaler Inhale 2 puffs into the lungs every 6 (six) hours as needed for wheezing or shortness of breath.  1 Inhaler 1  . ALPRAZolam (XANAX) 0.25 MG tablet TAKE ONE TABLET BY MOUTH ONCE DAILY AS NEEDED FOR ANXIETY. 15 tablet 0  . atorvastatin (LIPITOR) 20 MG tablet TAKE ONE TABLET BY MOUTH ONCE DAILY 30 tablet 5  . bisoprolol (ZEBETA) 5 MG tablet Take 1 tablet (5 mg total) by mouth daily. 30 tablet 5  . cholecalciferol (VITAMIN D) 1000 UNITS tablet Take 1,000 Units by mouth daily.    . diclofenac sodium (VOLTAREN) 1 % GEL Apply 2 g topically 4 (four) times daily. 1 Tube 1  . furosemide (LASIX) 20 MG tablet Take 1 tablet (20 mg total) by mouth daily as needed for edema. 30 tablet 2  . gabapentin (NEURONTIN) 100 MG capsule TAKE ONE CAPSULE BY MOUTH THREE TIMES DAILY 270 capsule 1  . levETIRAcetam (KEPPRA) 500 MG tablet TAKE ONE TABLET BY MOUTH TWICE DAILY 60 tablet 3  . loperamide (IMODIUM) 2 MG capsule Take 2 mg by mouth daily as needed for diarrhea or loose stools.     . pregabalin (LYRICA) 75 MG capsule Take 1 capsule (75 mg total) by mouth 2 (two) times daily. 60 capsule 5  . XARELTO 15 MG TABS tablet TAKE ONE TABLET BY MOUTH ONCE DAILY WITH  SUPPER 90 tablet 3  . predniSONE (DELTASONE) 20 MG tablet Take 2 pills for 3 days, 1 pill for 4 days 10 tablet 0   No current facility-administered medications for this visit.  Objective: BP (!) 98/52 (BP Location: Left Arm, Patient Position: Sitting, Cuff Size: Large)   Pulse 69   Temp 98.5 F (36.9 C) (Oral)   Ht 5\' 4"  (1.626 m)   Wt 197 lb 3.2 oz (89.4 kg)   SpO2 93%   BMI 33.85 kg/m  Gen: NAD, resting comfortably TM normal bilaterally, nares with erythema as well as throat.  CV: RRR no murmurs rubs or gallops Lungs: CTAB no crackles, wheeze, rhonchi. No signs of respiratory distress today Abdomen: soft/nontender/nondistended/normal bowel sounds. No rebound or guarding.  Ext: no edema Skin: warm, dry  Assessment/Plan:  COPD (chronic obstructive pulmonary disease) ([facility]) S: Patient presents with 21 days of symptoms at least. Symptoms  started with coughign up clear sputum, watery itcy eyes, wheezing, mild increase in windedness. Sick contacts: none.  Symptoms include: Increase in cough Increased shortness of breath increase in sputum production no change in sputum color- still clear Increase in frequency of wheeze.  She apparently ran out of prior albuterol   A/P: Patient presents with mild COPD exacerbation based on wheeze, mild shortness of breath. Will cover with course of prednisone for 7 days. Considered antibiotic and opted out for now- will monitor. Refilled albuterol and encouraged q6 hours over next 24 hours. I also think with her clear rhinorrhea, watery itchy eyes may have allergic element but prednisone should help that as well.    Meds ordered this encounter  Medications  . albuterol (PROVENTIL HFA;VENTOLIN HFA) 108 (90 Base) MCG/ACT inhaler    Sig: Inhale 2 puffs into the lungs every 6 (six) hours as needed for wheezing or shortness of breath.    Dispense:  1 Inhaler    Refill:  1  . predniSONE (DELTASONE) 20 MG tablet    Sig: Take 2 pills for 3 days, 1 pill for 4 days    Dispense:  10 tablet    Refill:  0    Return precautions advised.  [name], MD

## 2017-01-12 ENCOUNTER — Ambulatory Visit (INDEPENDENT_AMBULATORY_CARE_PROVIDER_SITE_OTHER): Payer: Medicare Other | Admitting: Family Medicine

## 2017-01-12 ENCOUNTER — Encounter: Payer: Self-pay | Admitting: Family Medicine

## 2017-01-12 VITALS — BP 136/82 | HR 93 | Temp 99.5°F | Ht 64.0 in | Wt 197.0 lb

## 2017-01-12 DIAGNOSIS — J181 Lobar pneumonia, unspecified organism: Secondary | ICD-10-CM

## 2017-01-12 DIAGNOSIS — J189 Pneumonia, unspecified organism: Secondary | ICD-10-CM

## 2017-01-12 DIAGNOSIS — J432 Centrilobular emphysema: Secondary | ICD-10-CM | POA: Diagnosis not present

## 2017-01-12 MED ORDER — DOXYCYCLINE HYCLATE 100 MG PO TABS: 100.0000 mg | ORAL_TABLET | Freq: Two times a day (BID) | ORAL | 0 refills | Status: DC

## 2017-01-12 NOTE — Patient Instructions (Signed)
Possible pneumonia right upper lobe  Considering you never improved with prednisone will not repeat right now but we could if needed. No wheeze heard today.   Will treat with doxycycline since you tolerated that well in November. If you are not improving by Friday or if you worsen, lets reevaluate

## 2017-01-12 NOTE — Progress Notes (Signed)
Subjective:  [name] is a 83 y.o. year old very pleasant female patient who presents for/with See problem oriented charting ROS- temperature up to 100 yesterday. Continued cough, congestion, wheeze. Cough mainly dry. No hemoptysis. No abdominal pain. No dysuria or polyuria. No rash  Past Medical History-  Patient Active Problem List   Diagnosis Date Noted  . Diastolic CHF ([place]) 76/28/3151    Priority: High  . History of cardioembolic stroke 76/16/0737    Priority: High  . Spinal stenosis, lumbar region, with neurogenic claudication 05/09/2014    Priority: High  . History of Focal seizure ([place]) 06/16/2013    Priority: High  . TIA (transient ischemic attack) 05/31/2013    Priority: High  . History of Large B-cell lymphoma ([place]) 02/18/2012    Priority: High  . Atrial fibrillation ([place]) 12/14/2008    Priority: High  . Bacteremia due to Streptococcus 11/21/2015    Priority: Medium  . Cellulitis of leg, right 11/20/2015    Priority: Medium  . Diverticulitis of colon 09/26/2014    Priority: Medium  . CKD (chronic kidney disease), stage III 09/26/2014    Priority: Medium  . Laryngeal carcinoma ([place])     Priority: Medium  . Anxiety state 10/20/2008    Priority: Medium  . Depression 10/20/2008    Priority: Medium  . COPD (chronic obstructive pulmonary disease) ([place]) 03/26/2008    Priority: Medium  . Hyperlipidemia 03/18/2007    Priority: Medium  . Essential hypertension 03/18/2007    Priority: Medium  . Aortic atherosclerosis ([place]) 06/30/2016    Priority: Low  . Weakness generalized 06/30/2013    Priority: Low  . Bilateral hand pain 01/09/2013    Priority: Low    Medications- reviewed and updated Current Outpatient Prescriptions  Medication Sig Dispense Refill  . acetaminophen (TYLENOL) 650 MG CR tablet Take 650 mg by mouth every 8 (eight) hours as needed for pain. Reported on 12/09/2015    . albuterol (PROVENTIL HFA;VENTOLIN HFA) 108 (90 Base) MCG/ACT inhaler  Inhale 2 puffs into the lungs every 6 (six) hours as needed for wheezing or shortness of breath. 1 Inhaler 1  . ALPRAZolam (XANAX) 0.25 MG tablet TAKE ONE TABLET BY MOUTH ONCE DAILY AS NEEDED FOR ANXIETY. 15 tablet 0  . atorvastatin (LIPITOR) 20 MG tablet TAKE ONE TABLET BY MOUTH ONCE DAILY 30 tablet 5  . bisoprolol (ZEBETA) 5 MG tablet Take 1 tablet (5 mg total) by mouth daily. 30 tablet 5  . cholecalciferol (VITAMIN D) 1000 UNITS tablet Take 1,000 Units by mouth daily.    . diclofenac sodium (VOLTAREN) 1 % GEL Apply 2 g topically 4 (four) times daily. 1 Tube 1  . furosemide (LASIX) 20 MG tablet Take 1 tablet (20 mg total) by mouth daily as needed for edema. 30 tablet 2  . gabapentin (NEURONTIN) 100 MG capsule TAKE ONE CAPSULE BY MOUTH THREE TIMES DAILY 270 capsule 1  . levETIRAcetam (KEPPRA) 500 MG tablet TAKE ONE TABLET BY MOUTH TWICE DAILY 60 tablet 3  . loperamide (IMODIUM) 2 MG capsule Take 2 mg by mouth daily as needed for diarrhea or loose stools.     . pregabalin (LYRICA) 75 MG capsule Take 1 capsule (75 mg total) by mouth 2 (two) times daily. 60 capsule 5  . XARELTO 15 MG TABS tablet TAKE ONE TABLET BY MOUTH ONCE DAILY WITH  SUPPER 90 tablet 3   No current facility-administered medications for this visit.     Objective: BP 136/82 (BP Location: Left  Arm, Patient Position: Sitting, Cuff Size: Large)   Pulse 93   Temp 99.5 F (37.5 C) (Oral)   Ht 5\' 4"  (1.626 m)   Wt 197 lb (89.4 kg)   SpO2 90%   BMI 33.81 kg/m  Gen: NAD, resting comfortably CV: RRR no murmurs rubs or gallops Lungs: prolonged expiratory phase but no wheeze. Slight crackles right upper lobe Abdomen: soft/nontender/nondistended/normal bowel sounds. obese Ext: trace edema Skin: warm, dry Neuro: grossly normal, moves all extremities  Assessment/Plan:  Community acquired pneumonia of right upper lobe of lung ([place]) S: seen about 2 weeks ago for cough, congestion, wheeze, increased clear sputum production thought  COPD exacerbation in setting of worsening allergies. Treated with prednisone but denies improvement. Then yesterday noted temperature to 100 degrees.  A/P: Concern for Right upper lobe pneumonia with crackles on exam and elevated temperature from baseline in setting of cough, congestion not improving with prednisone for COPD exacerbation. Advised x-ray and patient declines. She is a high risk patient given age, COPD, a fib history, hisotry of stroke, diastolic CHF. She tolerated doxycycline well in November so will retrial. Her GFR is 52- noting this as next step would be levaquin along with prednisone- she states firmly she would not go to hospital even if got worse so would have to work on outpatient treatment.   No wheeze on exam today to suggest COPD. Oxygen down slightly to 90% from 93% last visit.   Agrees to follow up Friday if not improving or sooner if worsens  Meds ordered this encounter  Medications  . doxycycline (VIBRA-TABS) 100 MG tablet    Sig: Take 1 tablet (100 mg total) by mouth 2 (two) times daily.    Dispense:  14 tablet    Refill:  0    Return precautions advised.  [name], MD

## 2017-01-17 ENCOUNTER — Other Ambulatory Visit: Payer: Self-pay | Admitting: Neurology

## 2017-01-27 ENCOUNTER — Encounter: Payer: Self-pay | Admitting: Adult Health

## 2017-01-27 ENCOUNTER — Other Ambulatory Visit: Payer: Self-pay | Admitting: Adult Health

## 2017-01-27 ENCOUNTER — Ambulatory Visit (INDEPENDENT_AMBULATORY_CARE_PROVIDER_SITE_OTHER): Payer: Medicare Other | Admitting: Adult Health

## 2017-01-27 ENCOUNTER — Ambulatory Visit (INDEPENDENT_AMBULATORY_CARE_PROVIDER_SITE_OTHER)
Admission: RE | Admit: 2017-01-27 | Discharge: 2017-01-27 | Disposition: A | Discharge status: Home / Self Care | Payer: Medicare Other | Source: Ambulatory Visit | Attending: Adult Health | Admitting: Adult Health

## 2017-01-27 VITALS — BP 130/70 | HR 76 | Temp 97.9°F

## 2017-01-27 DIAGNOSIS — J189 Pneumonia, unspecified organism: Secondary | ICD-10-CM

## 2017-01-27 MED ORDER — LEVOFLOXACIN 500 MG PO TABS: 500.0000 mg | ORAL_TABLET | Freq: Every day | ORAL | 0 refills | Status: DC

## 2017-01-27 MED ORDER — PREDNISONE 10 MG PO TABS: ORAL_TABLET | ORAL | 0 refills | Status: DC

## 2017-01-27 NOTE — Progress Notes (Signed)
Subjective:    Patient ID: [name], female    DOB: 08-Mar-1928, 83 y.o.   MRN: [id number]  HPI  83 year old female who  has a past medical history of ABDOMINAL INCISIONAL HERNIA (04/03/2010); Anxiety; Arthritis; Blood transfusion; COPD (chronic obstructive pulmonary disease) ([place]); Coronary artery disease; Depression; Diverticulitis of colon with hemorrhage (07/24/2008); DVT (deep venous thrombosis) ([place]) (1954); Exertional shortness of breath; Family history of anesthesia complication; GERD (04/18/9449); GERD (gastroesophageal reflux disease); H/O hiatal hernia; HCAP (healthcare-associated pneumonia) (06/26/2013); Heart murmur; History of TIAs; Hyperlipidemia; Hypertension; Laryngeal carcinoma (HCC) (hx of ca); Lymphona, mantle cell, inguinal region/lower limb ([place]); OSTEOARTHRITIS (05/03/2007); PEPTIC ULCER DISEASE (07/06/2008); Pneumonia (2013); PUD (peptic ulcer disease); PVD (peripheral vascular disease) ([place]); Seizures ([place]); Stroke ([place]) (05/26/2013); and VARICOSE VEINS LOWER EXTREMITIES W/INFLAMMATION (01/15/2009).   She is a patient of Dr. Yong Channel who I am seeing today for the first time. She was seen by Dr. Yong Channel 1.5 weeks ago for cough, congestion, and fever up to 100. He was concerned for right upper lobe pneumonia ( she declined chest x ray). She was prescribed Doxycycline for one week.   At that office visit her oxygen saturation was down slightly to 90% from 93% on previous visits. Today her pulse oxygen was in the 86-88%   She reports that she has not improved. She continues to have chest congestion, semi productive cough, shortness of breath, and fatigue.   She denies any fevers, n/v/d    Review of Systems See HPI   Past Medical History:  Diagnosis Date  . ABDOMINAL INCISIONAL HERNIA 04/03/2010      . Anxiety   . Arthritis    "lots; hands" (05/26/2013)  . Blood transfusion    "w/1st miscarriage" (05/26/2013)  . COPD (chronic obstructive pulmonary disease) ([place])   .  Coronary artery disease   . Depression   . Diverticulitis of colon with hemorrhage 07/24/2008  . DVT (deep venous thrombosis) ([place]) 1954   "after I had my first child" (05/26/2013)  . Exertional shortness of breath   . Family history of anesthesia complication    "sisters also get sick as a dog" (05/26/2013)  . GERD 05/03/2007      . GERD (gastroesophageal reflux disease)   . H/O hiatal hernia   . HCAP (healthcare-associated pneumonia) 06/26/2013  . Heart murmur    "when I was a child" (05/26/2013)  . History of TIAs   . Hyperlipidemia   . Hypertension   . Laryngeal carcinoma ([place]) hx of ca   remotely with resection and xrt/chronic hoarsemenss  . Lymphona, mantle cell, inguinal region/lower limb ([place])    "large c-cell" (05/26/2013)  . OSTEOARTHRITIS 05/03/2007  . PEPTIC ULCER DISEASE 07/06/2008  . Pneumonia 2013   "once" (05/26/2013)  . PUD (peptic ulcer disease)   . PVD (peripheral vascular disease) ([place])   . Seizures ([place])   . Stroke ([place]) 05/26/2013   "very mild; affected my speech for a while" (05/26/2013)  . VARICOSE VEINS LOWER EXTREMITIES W/INFLAMMATION 01/15/2009    Social History   Social History  . Marital status: Married    Spouse name: N/A  . Number of children: 6  . Years of education: N/A   Occupational History  . retired Retired   Social History Main Topics  . Smoking status: Former Smoker    Packs/day: 2.50    Years: 37.00    Types: Cigarettes    Quit date: 09/05/1984  . Smokeless tobacco: Never Used  . Alcohol use  No  . Drug use: No  . Sexual activity: No   Other Topics Concern  . Not on file   Social History Narrative   Married (husband patient of Dr. Yong Channel). 6 children (lost 2 for total 8). 15 grandkids. 6 greatgrandchildren.    Lives with husband. Husband drives. Patient does not drive.       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     Past Surgical History:  Procedure  Laterality Date  . ABDOMINAL EXPLORATION SURGERY  12/01/2012  . APPENDECTOMY  09/13/2008  . CAROTID ENDARTERECTOMY Bilateral 2004   2004 a month apart right and left  . CARPAL TUNNEL RELEASE Left ?1960's  . CATARACT EXTRACTION  2005   bilateral  . COLON SURGERY  09/13/2008   Laparoscopic assisted converted to open sigmoid colectomy.Archie Endo 09/13/2008 (05/26/2013)  . DILATION AND CURETTAGE OF UTERUS  1950's   "had 2 for miscarriages" (05/26/2013)  . DIVERTING ILEOSTOMY  09/22/2008   iliostomy for diverticulitis/notes 09/22/2008 (05/26/2013)  . HERNIA REPAIR  05/2010   ventral hernia repair  . ILEOSTOMY CLOSURE  01/2010  . RADICAL NECK DISSECTION  1986   "larynx cancer" (05/26/2013)  . TONSILLECTOMY AND ADENOIDECTOMY  1936   "tonsils grew back; no 2nd OR" (05/26/2013)  . TOTAL KNEE ARTHROPLASTY Bilateral    2002 left 2008 right  . TUBAL LIGATION  1972    Family History  Problem Relation Age of Onset  . Colon cancer Sister        colon  . Hyperlipidemia Mother   . Stroke Mother   . Cancer Father        mesothelioma    Allergies  Allergen Reactions  . Ativan [Lorazepam] Other (See Comments)    Severe hallucinations   . Propoxyphene Hcl   . Codeine Itching  . Penicillins Rash  . Vancomycin Rash    Current Outpatient Prescriptions on File Prior to Visit  Medication Sig Dispense Refill  . acetaminophen (TYLENOL) 650 MG CR tablet Take 650 mg by mouth every 8 (eight) hours as needed for pain. Reported on 12/09/2015    . albuterol (PROVENTIL HFA;VENTOLIN HFA) 108 (90 Base) MCG/ACT inhaler Inhale 2 puffs into the lungs every 6 (six) hours as needed for wheezing or shortness of breath. 1 Inhaler 1  . ALPRAZolam (XANAX) 0.25 MG tablet TAKE ONE TABLET BY MOUTH ONCE DAILY AS NEEDED FOR ANXIETY. 15 tablet 0  . atorvastatin (LIPITOR) 20 MG tablet TAKE ONE TABLET BY MOUTH ONCE DAILY 30 tablet 5  . bisoprolol (ZEBETA) 5 MG tablet Take 1 tablet (5 mg total) by mouth daily. 30 tablet 5  .  cholecalciferol (VITAMIN D) 1000 UNITS tablet Take 1,000 Units by mouth daily.    . diclofenac sodium (VOLTAREN) 1 % GEL Apply 2 g topically 4 (four) times daily. 1 Tube 1  . furosemide (LASIX) 20 MG tablet Take 1 tablet (20 mg total) by mouth daily as needed for edema. 30 tablet 2  . gabapentin (NEURONTIN) 100 MG capsule TAKE ONE CAPSULE BY MOUTH THREE TIMES DAILY 270 capsule 1  . levETIRAcetam (KEPPRA) 500 MG tablet TAKE 1 TABLET BY MOUTH TWICE DAILY 60 tablet 3  . loperamide (IMODIUM) 2 MG capsule Take 2 mg by mouth daily as needed for diarrhea or loose stools.     . pregabalin (LYRICA) 75 MG capsule Take 1 capsule (75 mg total) by mouth 2 (two) times daily. Max  capsule 5  . XARELTO 15 MG TABS tablet TAKE ONE TABLET BY MOUTH ONCE DAILY WITH  SUPPER 90 tablet 3   No current facility-administered medications on file prior to visit.     BP 130/70 (BP Location: Left Arm, Patient Position: Sitting, Cuff Size: Normal)   Pulse 76   Temp 97.9 F (36.6 C) (Oral)   SpO2 (!) 86%       Objective:   Physical Exam  Constitutional: She is oriented to person, place, and time. She appears well-developed and well-nourished. No distress.  Neck: Normal range of motion. Neck supple.  Cardiovascular: Normal rate, regular rhythm, normal heart sounds and intact distal pulses.  Exam reveals no gallop and no friction rub.   No murmur heard. Pulmonary/Chest: Effort normal. No respiratory distress. She has decreased breath sounds in the right middle field. She has rhonchi in the right middle field, the right lower field, the left upper field and the left middle field. She has no rales. She exhibits no tenderness.  Lymphadenopathy:    She has no cervical adenopathy.  Neurological: She is alert and oriented to person, place, and time.  Skin: Skin is warm and dry. No rash noted. She is not diaphoretic. No erythema. No pallor.  Psychiatric: She has a normal mood and affect. Her behavior is normal. Judgment and  thought content normal.  Nursing note and vitals reviewed.      Assessment & Plan:  1. Pneumonia due to infectious organism, unspecified laterality, unspecified part of lung - She is willing to go to get a chest x ray today.  - DG Chest 2 View; Future - Will likely prescribe Levaquin and prednisone - follow up with PCP if no improvement   Dorothyann Peng, NP

## 2017-02-25 ENCOUNTER — Other Ambulatory Visit: Payer: Self-pay | Admitting: Family Medicine

## 2017-03-16 ENCOUNTER — Other Ambulatory Visit: Payer: Self-pay

## 2017-03-16 ENCOUNTER — Telehealth: Payer: Self-pay

## 2017-03-16 DIAGNOSIS — R053 Chronic cough: Secondary | ICD-10-CM

## 2017-03-16 DIAGNOSIS — R05 Cough: Secondary | ICD-10-CM

## 2017-03-16 NOTE — Telephone Encounter (Signed)
Below message received through My Chart from patient's husband:   My wife [name](07/04/1928) cough no better. Could you give [place] a reccomendation  for a pulmonary doctor as you suggested? Thanks Ed [phone])   I responded stating in the future to please send messages related to his wife through her My Chart only.

## 2017-03-16 NOTE — Telephone Encounter (Signed)
May refer to Dr. Wert of pulmonary under chronic cough 

## 2017-03-16 NOTE — Telephone Encounter (Signed)
Referral placed as requested.

## 2017-03-18 ENCOUNTER — Telehealth: Payer: Self-pay | Admitting: Internal Medicine

## 2017-03-18 NOTE — Telephone Encounter (Signed)
Patient rescheduled to 03/22/17 at 10am with Dr Melvyn Novas per patient husband request.  Nothing further needed.

## 2017-03-22 ENCOUNTER — Other Ambulatory Visit (INDEPENDENT_AMBULATORY_CARE_PROVIDER_SITE_OTHER): Payer: Medicare Other

## 2017-03-22 ENCOUNTER — Ambulatory Visit (INDEPENDENT_AMBULATORY_CARE_PROVIDER_SITE_OTHER): Payer: Medicare Other | Admitting: Internal Medicine

## 2017-03-22 ENCOUNTER — Encounter: Payer: Self-pay | Admitting: Internal Medicine

## 2017-03-22 ENCOUNTER — Ambulatory Visit (INDEPENDENT_AMBULATORY_CARE_PROVIDER_SITE_OTHER)
Admission: RE | Admit: 2017-03-22 | Discharge: 2017-03-22 | Disposition: A | Discharge status: Home / Self Care | Payer: Medicare Other | Source: Ambulatory Visit | Attending: Internal Medicine | Admitting: Internal Medicine

## 2017-03-22 VITALS — BP 128/70 | HR 97 | Ht 62.5 in | Wt 194.0 lb

## 2017-03-22 DIAGNOSIS — R0600 Dyspnea, unspecified: Secondary | ICD-10-CM

## 2017-03-22 DIAGNOSIS — J9612 Chronic respiratory failure with hypercapnia: Secondary | ICD-10-CM | POA: Diagnosis not present

## 2017-03-22 DIAGNOSIS — R0609 Other forms of dyspnea: Secondary | ICD-10-CM

## 2017-03-22 DIAGNOSIS — J9611 Chronic respiratory failure with hypoxia: Secondary | ICD-10-CM | POA: Insufficient documentation

## 2017-03-22 DIAGNOSIS — J449 Chronic obstructive pulmonary disease, unspecified: Secondary | ICD-10-CM

## 2017-03-22 LAB — CBC WITH DIFFERENTIAL/PLATELET
BASOS PCT: 0.7 % (ref 0.0–3.0)
Basophils Absolute: 0.1 10*3/uL (ref 0.0–0.1)
Eosinophils Absolute: 0.1 10*3/uL (ref 0.0–0.7)
Eosinophils Relative: 1.5 % (ref 0.0–5.0)
HEMATOCRIT: 41.5 % (ref 36.0–46.0)
Hemoglobin: 13 g/dL (ref 12.0–15.0)
LYMPHS ABS: 1.7 10*3/uL (ref 0.7–4.0)
LYMPHS PCT: 18.1 % (ref 12.0–46.0)
MCHC: 31.4 g/dL (ref 30.0–36.0)
MCV: 86.6 fl (ref 78.0–100.0)
MONOS PCT: 6.7 % (ref 3.0–12.0)
Monocytes Absolute: 0.6 10*3/uL (ref 0.1–1.0)
NEUTROS ABS: 6.7 10*3/uL (ref 1.4–7.7)
Neutrophils Relative %: 73 % (ref 43.0–77.0)
PLATELETS: 203 10*3/uL (ref 150.0–400.0)
RBC: 4.79 Mil/uL (ref 3.87–5.11)
RDW: 15.6 % — AB (ref 11.5–15.5)
WBC: 9.1 10*3/uL (ref 4.0–10.5)

## 2017-03-22 LAB — TSH: TSH: 0.94 u[IU]/mL (ref 0.35–4.50)

## 2017-03-22 LAB — BASIC METABOLIC PANEL
BUN: 12 mg/dL (ref 6–23)
CHLORIDE: 104 meq/L (ref 96–112)
CO2: 30 meq/L (ref 19–32)
CREATININE: 1.03 mg/dL (ref 0.40–1.20)
Calcium: 9.6 mg/dL (ref 8.4–10.5)
GFR: 53.87 mL/min — ABNORMAL LOW (ref >60.00)
Glucose, Bld: 103 mg/dL — ABNORMAL HIGH (ref 70–99)
Potassium: 4.8 mEq/L (ref 3.5–5.1)
SODIUM: 142 meq/L (ref 135–145)

## 2017-03-22 LAB — BRAIN NATRIURETIC PEPTIDE: PRO B NATRI PEPTIDE: 243 pg/mL — AB (ref 0.0–100.0)

## 2017-03-22 LAB — HEPATIC FUNCTION PANEL
ALT: 6 U/L (ref 0–35)
AST: 11 U/L (ref 0–37)
Albumin: 3.8 g/dL (ref 3.5–5.2)
Alkaline Phosphatase: 79 U/L (ref 39–117)
Bilirubin, Direct: 0.2 mg/dL (ref 0.0–0.3)
TOTAL PROTEIN: 7.7 g/dL (ref 6.0–8.3)
Total Bilirubin: 0.9 mg/dL (ref 0.2–1.2)

## 2017-03-22 MED ORDER — PREDNISONE 10 MG PO TABS: ORAL_TABLET | ORAL | 0 refills | Status: DC

## 2017-03-22 MED ORDER — TIOTROPIUM BROMIDE-OLODATEROL 2.5-2.5 MCG/ACT IN AERS: 2.0000 | INHALATION_SPRAY | Freq: Every day | RESPIRATORY_TRACT | 0 refills | Status: DC

## 2017-03-22 MED ORDER — TIOTROPIUM BROMIDE-OLODATEROL 2.5-2.5 MCG/ACT IN AERS: 2.0000 | INHALATION_SPRAY | Freq: Every day | RESPIRATORY_TRACT | 6 refills | Status: DC

## 2017-03-22 NOTE — Progress Notes (Signed)
ATC, NA and unable to leave msg due to VM box full

## 2017-03-22 NOTE — Progress Notes (Signed)
Subjective:     Patient ID: [name], female   DOB: 1928/01/19,     MRN: [id number]  HPI   79 yowf stopped smoking due to larynx ca 1986 s/p RT with gruff voice ever worse sob/cough since May 2018 so referred to pulmonary clinic 03/22/2017 by Dr   Yong Channel    03/22/2017 1st [facility] office visit/ [name]   Chief Complaint  Patient presents with  . Pulmonary Consult    Referred by Dr. [name]. Pt c/o cough for 3 months- prod at times with yellow sputum.  She states cough is esp worse after eating and at night, sometimes waking her up. She states she gets choked every time she eats or drinks something. She states that she gets SOB when she walks up stairs or an incline.    prior to deterioration  In May 2018 baseline Chronic doe = MMRC2 = can't walk a nl pace on a flat grade s sob but does fine slow and flat eg HT hanging on cart  Flare of sob assoc with dysphagia/ breathing  does better when swallowing better  No better with inhalers but use is extremely limited  Breathing/ coughing better on prednisone than off  Cough is more dry than wet, more day than noct   No obvious patterns day to day or daytime variability or assoc currently excess/ purulent sputum or mucus plugs or hemoptysis or cp or chest tightness, subjective wheeze or overt sinus or hb symptoms. No unusual exp hx or h/o childhood pna/ asthma or knowledge of premature birth.  Sleeping ok most noct without nocturnal  or early am exacerbation  of respiratory  c/o's or need for noct saba. Also denies any obvious fluctuation of symptoms with weather or environmental changes or other aggravating or alleviating factors except as outlined above   Current Medications, Allergies, Complete Past Medical History, Past Surgical History, Family History, and Social History were reviewed in [employer] record.  ROS  The following are not active complaints unless bolded sore throat, dysphagia, dental  problems, itching, sneezing,  nasal congestion or excess/ purulent secretions, ear ache,   fever, chills, sweats, unintended wt loss, classically pleuritic or exertional cp,  orthopnea pnd or leg swelling, presyncope, palpitations, abdominal pain, anorexia, nausea, vomiting, diarrhea  or change in bowel or bladder habits, change in stools or urine, dysuria,hematuria,  rash, arthralgias, visual complaints, headache, numbness, weakness or ataxia or problems with walking or coordination,  change in mood/affect or memory.           Review of Systems     Objective:   Physical Exam    very hoarse amb wf with harsh upper airway coughing fits - she and husband very sketch with details of meds/ symptoms  Wt Readings from Last 3 Encounters:  03/22/17 194 lb (88 kg)  01/12/17 197 lb (89.4 kg)  12/31/16 197 lb 3.2 oz (89.4 kg)    Vital signs reviewed - - Note on arrival 02 sats  85% on RA,  95% on 2lpm       HEENT: nl dentition, turbinates bilaterally, and oropharynx. Nl external ear canals without cough reflex   NECK :  without JVD/Nodes/TM/ nl carotid upstrokes bilaterally   LUNGS: no acc muscle use,  Nl contour chest with minimal insp and exp rhonchi bilaterally and lots of upper airway noises transmitted to lower airways     CV:  RRR  no s3 or murmur or increase in P2, and  1-2+ symb lower ext pitting  edema   ABD:  soft and nontender with nl inspiratory excursion in the supine position. No bruits or organomegaly appreciated, bowel sounds nl  MS:  Nl gait/ ext warm without deformities, calf tenderness, cyanosis or clubbing No obvious joint restrictions   SKIN: warm and dry without lesions    NEURO:  alert, approp, nl sensorium with  no motor or cerebellar deficits apparent.     CXR PA and Lateral:   03/22/2017 :    I personally reviewed images and agree with radiology impression as follows:    COPD-smoking related changes, stable. Acute atelectasis or early infiltrate lateral  to the left heart border which likely lies in the anterior aspect of the lower lobe My impression: extremely mild atx changes not clinically relevant at this point    Labs ordered/ reviewed:      Chemistry      Component Value Date/Time   NA 142 03/22/2017 1149   NA 139 07/06/2012 0949   K 4.8 03/22/2017 1149   K 4.4 07/06/2012 0949   CL 104 03/22/2017 1149   CL 105 07/06/2012 0949   CO2 30 03/22/2017 1149   CO2 21 (L) 07/06/2012 0949   BUN 12 03/22/2017 1149   BUN 11.0 07/06/2012 0949   CREATININE 1.03 03/22/2017 1149   CREATININE 1.0 07/06/2012 0949      Component Value Date/Time   CALCIUM 9.6 03/22/2017 1149   CALCIUM 9.3 07/06/2012 0949   ALKPHOS 79 03/22/2017 1149   ALKPHOS 74 07/06/2012 0949   AST 11 03/22/2017 1149   AST 26 07/06/2012 0949   ALT 6 03/22/2017 1149   ALT 24 07/06/2012 0949   BILITOT 0.9 03/22/2017 1149   BILITOT 0.51 07/06/2012 0949        Lab Results  Component Value Date   WBC 9.1 03/22/2017   HGB 13.0 03/22/2017   HCT 41.5 03/22/2017   MCV 86.6 03/22/2017   PLT 203.0 03/22/2017       Lab Results  Component Value Date   TSH 0.94 03/22/2017     Lab Results  Component Value Date   PROBNP 243.0 (H) 03/22/2017               Assessment:

## 2017-03-22 NOTE — Patient Instructions (Addendum)
Prednisone 10 mg take  4 each am x 2 days,   2 each am x 2 days,  1 each am x 2 days and stop   02 2lpm 24/7 for now   Stiolto 2 pffs each am    Pantoprazole (protonix) 40 mg   Take  30-60 min before first meal of the day and Pepcid (famotidine)  20 mg one @  bedtime until return to office - this is the best way to tell whether stomach acid is contributing to your problem.     GERD (REFLUX)  is an extremely common cause of respiratory symptoms just like yours , many times with no obvious heartburn at all.    It can be treated with medication, but also with lifestyle changes including elevation of the head of your bed (ideally with 6 inch  bed blocks),  Smoking cessation, avoidance of late meals, excessive alcohol, and avoid fatty foods, chocolate, peppermint, colas, red wine, and acidic juices such as orange juice.  NO MINT OR MENTHOL PRODUCTS SO NO COUGH DROPS   USE SUGARLESS CANDY INSTEAD (Jolley ranchers or Stover's or Life Savers) or even ice chips will also do - the key is to swallow to prevent all throat clearing. NO OIL BASED VITAMINS - use powdered substitutes.    Please remember to go to the lab and x-ray department downstairs in the basement  for your tests - we will call you with the results when they are available.     See Tammy NP w/in 2 weeks with all your medications, even over the counter meds, separated in two separate bags, the ones you take no matter what vs the ones you stop once you feel better and take only as needed when you feel you need them.   Tammy  will generate for you a new user friendly medication calendar that will put [place] all on the same page re: your medication use.     Without this process, it simply isn't possible to assure that we are providing  your outpatient care  with  the attention to detail we feel you deserve.   If we cannot assure that you're getting that kind of care,  then we cannot manage your problem effectively from this clinic.  Once you  have seen Tammy and we are sure that we're all on the same page with your medication use she will arrange follow up with me.

## 2017-03-23 NOTE — Assessment & Plan Note (Signed)
Qualifying sats  03/22/2017 = 85% RA at rest/ 95 % on 2lpm   rec as of 03/22/2017  = 2lpm 24/7

## 2017-03-23 NOTE — Assessment & Plan Note (Signed)
Spirometry 03/22/2017  FEV1 0.81 (52%)  Ratio 64 with typical curvature off all rx  - 03/22/2017  After extensive coaching HFA effectiveness =    50% from 0 baseline > try stiolto   DDX of  difficult airways management almost all start with A and  include Adherence, Ace Inhibitors, Acid Reflux, Active Sinus Disease, Alpha 1 Antitripsin deficiency, Anxiety masquerading as Airways dz,  ABPA,  Allergy(esp in young), Aspiration (esp in elderly), Adverse effects of meds,  Active smokers, A bunch of PE's (a small clot burden can't cause this syndrome unless there is already severe underlying pulm or vascular dz with poor reserve) plus two Bs  = Bronchiectasis and Beta blocker use..and one C= CHF   Adherence is always the initial "prime suspect" and is a multilayered concern that requires a "trust but verify" approach in every patient - starting with knowing how to use medications, especially inhalers, correctly, keeping up with refills and understanding the fundamental difference between maintenance and prns vs those medications only taken for a very short course and then stopped and not refilled.  - see hfa teaching - return with all meds in hand using a trust but verify approach to confirm accurate Medication  Reconciliation The principal here is that until we are certain that the  patients are doing what we've asked, it makes no sense to ask them to do more.   ? Acid (or non-acid) GERD > always difficult to exclude as up to 75% of pts in some series report no assoc GI/ Heartburn symptoms> rec max (24h)  acid suppression and diet restrictions/ reviewed and instructions given in writing.   ? Allergy/ asthma component > Prednisone 10 mg take  4 each am x 2 days,   2 each am x 2 days,  1 each am x 2 days and stop /  Consider change to ICS/LABA nebs if not mastering SMI or not responding when returns in 2 weeks  ? A bunch of pe's > unlikely on xarelt  ? BB effect > very unlikely on zebeta   ? chf > bnp  intermediate range and she has significant peripheral edema "for years"  With diastolic dysfunction on last echo 11/2015    Total time devoted to counseling  > 50 % of initial 60 min office visit:  review case with pt/ discussion of options/alternatives/ personally creating written customized instructions  in presence of pt  then going over those specific  Instructions directly with the pt including how to use all of the meds but in particular covering each new medication in detail and the difference between the maintenance= "automatic" meds and the prns using an action plan format for the latter (If this problem/symptom => do that organization reading Left to right).  Please see AVS from this visit for a full list of these instructions which I personally wrote for this pt and  are unique to this visit.

## 2017-03-23 NOTE — Assessment & Plan Note (Signed)
Clearly multifactorial:  Copd/ diastolic dysfunction / geriatric decline   No evidence anemia or thyroid dz

## 2017-03-24 ENCOUNTER — Telehealth: Payer: Self-pay | Admitting: Neurology

## 2017-03-24 NOTE — Telephone Encounter (Signed)
Called and spoke with dghtr, [name]. Advsd her Dr Tomi Likens did add the Lyrica in April for leg pain, which the Pt was to have tried before, but apparently did not give it a chance. Pt was to continue Gabapentin as well. [name] verbalized understanding.

## 2017-03-24 NOTE — Progress Notes (Signed)
Pt aware of results 

## 2017-03-24 NOTE — Telephone Encounter (Signed)
PT's daughter called and said she is on Lyraca and Gabapentin and should she be on both, please advise

## 2017-03-24 NOTE — Progress Notes (Signed)
Spoke with pt and notified of results per Dr. Wert. Pt verbalized understanding and denied any questions. 

## 2017-04-06 ENCOUNTER — Other Ambulatory Visit: Payer: Self-pay | Admitting: Family Medicine

## 2017-04-09 ENCOUNTER — Emergency Department (HOSPITAL_COMMUNITY)
Admission: EM | Admit: 2017-04-09 | Discharge: 2017-04-09 | Disposition: A | Discharge status: Home / Self Care | Payer: Medicare Other | Attending: Emergency Medicine | Admitting: Emergency Medicine

## 2017-04-09 ENCOUNTER — Emergency Department (HOSPITAL_COMMUNITY): Payer: Medicare Other

## 2017-04-09 ENCOUNTER — Encounter (HOSPITAL_COMMUNITY): Payer: Self-pay | Admitting: Emergency Medicine

## 2017-04-09 ENCOUNTER — Telehealth: Payer: Self-pay | Admitting: Internal Medicine

## 2017-04-09 DIAGNOSIS — Z79899 Other long term (current) drug therapy: Secondary | ICD-10-CM | POA: Diagnosis not present

## 2017-04-09 DIAGNOSIS — Z8521 Personal history of malignant neoplasm of larynx: Secondary | ICD-10-CM | POA: Diagnosis not present

## 2017-04-09 DIAGNOSIS — I2510 Atherosclerotic heart disease of native coronary artery without angina pectoris: Secondary | ICD-10-CM | POA: Insufficient documentation

## 2017-04-09 DIAGNOSIS — R0902 Hypoxemia: Secondary | ICD-10-CM | POA: Diagnosis not present

## 2017-04-09 DIAGNOSIS — Z7901 Long term (current) use of anticoagulants: Secondary | ICD-10-CM | POA: Insufficient documentation

## 2017-04-09 DIAGNOSIS — Z87891 Personal history of nicotine dependence: Secondary | ICD-10-CM | POA: Diagnosis not present

## 2017-04-09 DIAGNOSIS — Z96653 Presence of artificial knee joint, bilateral: Secondary | ICD-10-CM | POA: Insufficient documentation

## 2017-04-09 DIAGNOSIS — I5032 Chronic diastolic (congestive) heart failure: Secondary | ICD-10-CM | POA: Insufficient documentation

## 2017-04-09 DIAGNOSIS — J449 Chronic obstructive pulmonary disease, unspecified: Secondary | ICD-10-CM | POA: Diagnosis not present

## 2017-04-09 DIAGNOSIS — R400 Somnolence: Secondary | ICD-10-CM | POA: Diagnosis not present

## 2017-04-09 DIAGNOSIS — N183 Chronic kidney disease, stage 3 (moderate): Secondary | ICD-10-CM | POA: Insufficient documentation

## 2017-04-09 DIAGNOSIS — Z8673 Personal history of transient ischemic attack (TIA), and cerebral infarction without residual deficits: Secondary | ICD-10-CM | POA: Diagnosis not present

## 2017-04-09 DIAGNOSIS — I130 Hypertensive heart and chronic kidney disease with heart failure and stage 1 through stage 4 chronic kidney disease, or unspecified chronic kidney disease: Secondary | ICD-10-CM | POA: Insufficient documentation

## 2017-04-09 DIAGNOSIS — Z8572 Personal history of non-Hodgkin lymphomas: Secondary | ICD-10-CM | POA: Diagnosis not present

## 2017-04-09 DIAGNOSIS — R4182 Altered mental status, unspecified: Secondary | ICD-10-CM | POA: Diagnosis present

## 2017-04-09 LAB — CBC WITH DIFFERENTIAL/PLATELET
Basophils Absolute: 0 10*3/uL (ref 0.0–0.1)
Basophils Relative: 0 %
Eosinophils Absolute: 0.2 10*3/uL (ref 0.0–0.7)
Eosinophils Relative: 4 %
HEMATOCRIT: 39.9 % (ref 36.0–46.0)
Hemoglobin: 12.6 g/dL (ref 12.0–15.0)
LYMPHS ABS: 1.9 10*3/uL (ref 0.7–4.0)
LYMPHS PCT: 35 %
MCH: 27.6 pg (ref 26.0–34.0)
MCHC: 31.6 g/dL (ref 30.0–36.0)
MCV: 87.3 fL (ref 78.0–100.0)
MONO ABS: 0.4 10*3/uL (ref 0.1–1.0)
MONOS PCT: 7 %
NEUTROS ABS: 2.9 10*3/uL (ref 1.7–7.7)
Neutrophils Relative %: 54 %
Platelets: 180 10*3/uL (ref 150–400)
RBC: 4.57 MIL/uL (ref 3.87–5.11)
RDW: 14.9 % (ref 11.5–15.5)
WBC: 5.4 10*3/uL (ref 4.0–10.5)

## 2017-04-09 LAB — COMPREHENSIVE METABOLIC PANEL
ALT: 11 U/L — ABNORMAL LOW (ref 14–54)
ANION GAP: 8 (ref 5–15)
AST: 12 U/L — AB (ref 15–41)
Albumin: 3.8 g/dL (ref 3.5–5.0)
Alkaline Phosphatase: 72 U/L (ref 38–126)
BILIRUBIN TOTAL: 0.8 mg/dL (ref 0.3–1.2)
BUN: 12 mg/dL (ref 6–20)
CO2: 27 mmol/L (ref 22–32)
Calcium: 9.3 mg/dL (ref 8.9–10.3)
Chloride: 105 mmol/L (ref 101–111)
Creatinine, Ser: 0.97 mg/dL (ref 0.44–1.00)
GFR calc Af Amer: 59 mL/min — ABNORMAL LOW (ref >60)
GFR, EST NON AFRICAN AMERICAN: 51 mL/min — AB (ref >60)
Glucose, Bld: 98 mg/dL (ref 65–99)
POTASSIUM: 3.9 mmol/L (ref 3.5–5.1)
Sodium: 140 mmol/L (ref 135–145)
TOTAL PROTEIN: 7.4 g/dL (ref 6.5–8.1)

## 2017-04-09 LAB — BLOOD GAS, VENOUS
ACID-BASE EXCESS: 2.4 mmol/L — AB (ref 0.0–2.0)
Bicarbonate: 27.5 mmol/L (ref 20.0–28.0)
FIO2: 21
O2 Saturation: 75.2 %
PH VEN: 7.388 (ref 7.250–7.430)
Patient temperature: 98.1
pCO2, Ven: 46.5 mmHg (ref 44.0–60.0)
pO2, Ven: 43.2 mmHg (ref 32.0–45.0)

## 2017-04-09 NOTE — Telephone Encounter (Signed)
Spoke with the pt's spouse [name]  He reports that the pt has been extremely fatigued since around 03/25/17  He states that usually she is very talkative and now she does not speak at all She has been sleeping alot She was seen here on 03/22/17 and was started on o2 2lpm 24/7  She has dx of resp failure with hypercapnia  I advised she may have an elevated co2 level and needs to be taken to the ED ASAP  He verbalized understanding and states he is going to take her now  She has planned med cal with TP next wk and I urged him to make sure she keeps ov if she can  Will forward to MW as Juluis Rainier

## 2017-04-09 NOTE — ED Provider Notes (Signed)
[facility] DEPT Provider Note   CSN: [id number] Arrival date & time: 04/09/17  1928     History   Chief Complaint Chief Complaint  Patient presents with  . Altered Mental Status  . Hypoxia    HPI [name] is a 83 y.o. female.  HPI  83 year old female with an extensive past medical history listed below presents to emergency department for 1-1.5 weeks of somnolence. The patient lost her first born child recently, and has been trying to deal with the loss. Additionally patient was recently treated for acute bronchitis by her primary care provider who noted that the patient was hypoxic on room air. She was treated with prednisone and antibiotics. Patient was also placed on 2 L nasal cannula. Following the treatment is when the patient started having a somnolent episodes. Family reports that the patient has been spending a lot of time in bed and would only respond to them with gestures. They report that she had a difficult time keeping her head up on the sitting position. They deny any recent fevers. They report that the patient's cough has significantly improved with the treatment. Patient has significant improvement just prior to arrival, after being taken off her oxygen. They spoke with the primary care provider who was concerned that she had elevated carbon dioxide levels. They recommended she present to the emergency department for evaluation. Patient denies any chest pain, shortness of breath, nausea, vomiting, and abdominal pain. Patient has chronic diarrhea but no acute change.  Patient is on Xanax but she denies any self-medication and family corroborates.  Past Medical History:  Diagnosis Date  . ABDOMINAL INCISIONAL HERNIA 04/03/2010      . Anxiety   . Arthritis    "lots; hands" (05/26/2013)  . Blood transfusion    "w/1st miscarriage" (05/26/2013)  . COPD (chronic obstructive pulmonary disease) ([place])   . Coronary artery disease   . Depression   . Diverticulitis  of colon with hemorrhage 07/24/2008  . DVT (deep venous thrombosis) ([place]) 1954   "after I had my first child" (05/26/2013)  . Exertional shortness of breath   . Family history of anesthesia complication    "sisters also get sick as a dog" (05/26/2013)  . GERD 05/03/2007      . GERD (gastroesophageal reflux disease)   . H/O hiatal hernia   . HCAP (healthcare-associated pneumonia) 06/26/2013  . Heart murmur    "when I was a child" (05/26/2013)  . History of TIAs   . Hyperlipidemia   . Hypertension   . 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 Lymphona, mantle cell, inguinal region/lower limb ([place])    "large c-cell" (05/26/2013)  . OSTEOARTHRITIS 05/03/2007  . PEPTIC ULCER DISEASE 07/06/2008  . Pneumonia 2013   "once" (05/26/2013)  . PUD (peptic ulcer disease)   . PVD (peripheral vascular disease) ([employer])   . Seizures ([place])   . Stroke ([place]) 05/26/2013   "very mild; affected my speech for a while" (05/26/2013)  . VARICOSE VEINS LOWER EXTREMITIES W/INFLAMMATION 01/15/2009    Patient Active Problem List   Diagnosis Date Noted  . Chronic respiratory failure with hypoxia and hypercapnia ([place]) 03/22/2017  . Aortic atherosclerosis ([place]) 06/30/2016  . Diastolic CHF ([place]) 09/32/3557  . Bacteremia due to Streptococcus 11/21/2015  . Cellulitis of leg, right 11/20/2015  . History of cardioembolic stroke 32/20/2542  . Diverticulitis of colon 09/26/2014  . CKD (chronic kidney disease), stage III 09/26/2014  .  Laryngeal carcinoma ([place])   . Spinal stenosis, lumbar region, with neurogenic claudication 05/09/2014  . Weakness generalized 06/30/2013  . History of Focal seizure ([place]) 06/16/2013  . TIA (transient ischemic attack) 05/31/2013  . Bilateral hand pain 01/09/2013  . History of Large B-cell lymphoma ([place]) 02/18/2012  . Atrial fibrillation ([place]) 12/14/2008  . Anxiety state 10/20/2008  . Depression 10/20/2008  . Dyspnea on exertion  07/06/2008  . COPD GOLD II 03/26/2008  . Hyperlipidemia 03/18/2007  . Essential hypertension 03/18/2007    Past Surgical History:  Procedure Laterality Date  . ABDOMINAL EXPLORATION SURGERY  12/01/2012  . APPENDECTOMY  09/13/2008  . CAROTID ENDARTERECTOMY Bilateral 2004   2004 a month apart right and left  . CARPAL TUNNEL RELEASE Left ?1960's  . CATARACT EXTRACTION  2005   bilateral  . COLON SURGERY  09/13/2008   Laparoscopic assisted converted to open sigmoid colectomy.Archie Endo 09/13/2008 (05/26/2013)  . DILATION AND CURETTAGE OF UTERUS  1950's   "had 2 for miscarriages" (05/26/2013)  . DIVERTING ILEOSTOMY  09/22/2008   iliostomy for diverticulitis/notes 09/22/2008 (05/26/2013)  . HERNIA REPAIR  05/2010   ventral hernia repair  . ILEOSTOMY CLOSURE  01/2010  . RADICAL NECK DISSECTION  1986   "larynx cancer" (05/26/2013)  . TONSILLECTOMY AND ADENOIDECTOMY  1936   "tonsils grew back; no 2nd OR" (05/26/2013)  . TOTAL KNEE ARTHROPLASTY Bilateral    2002 left 2008 right  . TUBAL LIGATION  1972    OB History    No data available       Home Medications    Prior to Admission medications   Medication Sig Start Date End Date Taking? Authorizing Provider  ALPRAZolam (XANAX) 0.25 MG tablet TAKE ONE TABLET BY MOUTH ONCE DAILY AS NEEDED FOR ANXIETY. 06/24/16  Yes Marin Olp, MD  atorvastatin (LIPITOR) 20 MG tablet TAKE 1 TABLET BY MOUTH ONCE DAILY 04/06/17  Yes Marin Olp, MD  bisoprolol (ZEBETA) 5 MG tablet Take 1 tablet (5 mg total) by mouth daily. 04/28/16  Yes Marin Olp, MD  cholecalciferol (VITAMIN D) 1000 UNITS tablet Take 1,000 Units by mouth daily.   Yes [provider]  Cyanocobalamin (VITAMIN B-12 PO) Take 1 tablet by mouth daily.   Yes [provider]  gabapentin (NEURONTIN) 100 MG capsule TAKE ONE CAPSULE BY MOUTH THREE TIMES DAILY Patient taking differently: TAKE ONE CAPSULE BY MOUTH IN THE EVENINIG 10/19/16  Yes Jaffe, Adam R, DO    levETIRAcetam (KEPPRA) 500 MG tablet TAKE 1 TABLET BY MOUTH TWICE DAILY 01/18/17  Yes Jaffe, Adam R, DO  Multiple Vitamins-Minerals (MULTIVITAMIN ADULTS PO) Take 1 tablet by mouth daily.   Yes [provider]  Tiotropium Bromide-Olodaterol (STIOLTO RESPIMAT) 2.5-2.5 MCG/ACT AERS Inhale 2 puffs into the lungs daily. 03/22/17  Yes Tanda Rockers, MD  XARELTO 15 MG TABS tablet TAKE ONE TABLET BY MOUTH ONCE DAILY WITH  SUPPER 10/14/16  Yes Marin Olp, MD  acetaminophen (TYLENOL) 650 MG CR tablet Take 650 mg by mouth every 8 (eight) hours as needed for pain. Reported on 12/09/2015    [provider]  diclofenac sodium (VOLTAREN) 1 % GEL Apply 2 g topically 4 (four) times daily. Patient not taking: Reported on 04/09/2017 04/28/16   Marin Olp, MD  furosemide (LASIX) 20 MG tablet Take 1 tablet (20 mg total) by mouth daily as needed for edema. 04/28/16   Marin Olp, MD  loperamide (IMODIUM) 2 MG capsule Take 2 mg by mouth daily  as needed for diarrhea or loose stools.     [provider]  predniSONE (DELTASONE) 10 MG tablet Take  4 each am x 2 days,   2 each am x 2 days,  1 each am x 2 days and stop Patient not taking: Reported on 04/09/2017 03/22/17   Tanda Rockers, MD  pregabalin (LYRICA) 75 MG capsule Take 1 capsule (75 mg total) by mouth 2 (two) times daily. 11/10/16   Tomi Likens, Adam R, DO  VENTOLIN HFA 108 (90 Base) MCG/ACT inhaler Inhale 2 puffs into the lungs every 6 (six) hours as needed for wheezing or shortness of breath.  12/31/16   [provider]    Family History Family History  Problem Relation Age of Onset  . Colon cancer Sister        colon  . Hyperlipidemia Mother   . Stroke Mother   . Cancer Father        mesothelioma    Social History Social History  Substance Use Topics  . Smoking status: Former Smoker    Packs/day: 2.50    Years: 37.00    Types: Cigarettes    Quit date: 09/05/1984  . Smokeless tobacco: Never Used  . Alcohol  use No     Allergies   Ativan [lorazepam]; Propoxyphene hcl; Codeine; Penicillins; and Vancomycin   Review of Systems Review of Systems All other systems are reviewed and are negative for acute change except as noted in the HPI   Physical Exam Updated Vital Signs BP (!) 169/90 (BP Location: Left Arm)   Pulse 84   Temp 98.1 F (36.7 C) (Oral)   Resp 20   SpO2 (S) (!) 85% Comment: RA  Physical Exam  Constitutional: She is oriented to person, place, and time. She appears well-developed and well-nourished. No distress.  HENT:  Head: Normocephalic and atraumatic.  Nose: Nose normal.  Eyes: Pupils are equal, round, and reactive to light. Conjunctivae and EOM are normal. Right eye exhibits no discharge. Left eye exhibits no discharge. No scleral icterus.  Neck: Normal range of motion. Neck supple.  Cardiovascular: Normal rate and regular rhythm.  Exam reveals no gallop and no friction rub.   No murmur heard. Pulmonary/Chest: Effort normal and breath sounds normal. No stridor. No respiratory distress. She has no rales.  Abdominal: Soft. She exhibits no distension. There is no tenderness.  Musculoskeletal: She exhibits no edema or tenderness.  Neurological: She is alert and oriented to person, place, and time.  Skin: Skin is warm and dry. No rash noted. She is not diaphoretic. No erythema.  Psychiatric: She has a normal mood and affect.  Vitals reviewed.    ED Treatments / Results  Labs (all labs ordered are listed, but only abnormal results are displayed) Labs Reviewed  COMPREHENSIVE METABOLIC PANEL - Abnormal; Notable for the following:       Result Value   AST 12 (*)    ALT 11 (*)    GFR calc non Af Amer 51 (*)    GFR calc Af Amer 59 (*)    All other components within normal limits  BLOOD GAS, VENOUS - Abnormal; Notable for the following:    Acid-Base Excess 2.4 (*)    All other components within normal limits  CBC WITH DIFFERENTIAL/PLATELET    EKG  EKG  Interpretation None       Radiology Dg Chest 2 View  Result Date: 04/09/2017 CLINICAL DATA:  Hypoxia. Prescribed oxygen last week for low oxygen saturations.  Increased lethargy and confusion over 2 weeks. Cough for 3 months. Shortness of breath. Former smoker. EXAM: CHEST  2 VIEW COMPARISON:  03/22/2017 FINDINGS: Mild cardiac enlargement without pulmonary vascular congestion. Probable atelectasis in the left lung base. No consolidation or airspace disease. No blunting of costophrenic angles. No pneumothorax. Calcified and tortuous aorta. IMPRESSION: Cardiac enlargement. Atelectasis in the left base. No active disease. Electronically Signed   By: Lucienne Capers M.D.   On: 04/09/2017 21:12    Procedures Procedures (including critical care time)  Medications Ordered in ED Medications - No data to display   Initial Impression / Assessment and Plan / ED Course  I have reviewed the triage vital signs and the nursing notes.  Pertinent labs & imaging results that were available during my care of the patient were reviewed by me and considered in my medical decision making (see chart for details).     Patient is afebrile with stable vital signs, satting 94% on 2 L nasal cannula. She is well-appearing and well-hydrated. No evidence of volume overload on exam. No respiratory distress. Labs grossly reassuring. VBG within normal limits. Chest x-ray without evidence of pneumonia, pleural effusions, pulmonary edema.   The patient appears reasonably screened and/or stabilized for discharge and I doubt any other medical condition or other [facility] requiring further screening, evaluation, or treatment in the ED at this time prior to discharge.  The patient is safe for discharge with strict return precautions.   Final Clinical Impressions(s) / ED Diagnoses   Final diagnoses:  Hypoxia  Somnolence   Disposition: Discharge  Condition: Good  I have discussed the results, Dx and Tx plan with the  patient and family who expressed understanding and agree(s) with the plan. Discharge instructions discussed at great length. The patient and family were given strict return precautions who verbalized understanding of the instructions. No further questions at time of discharge.    New Prescriptions   No medications on file    Follow Up: Marin Olp, [place]  [id number] [phone]  Schedule an appointment as soon as possible for a visit  in 3-5 days      Daeshaun Specht, Grayce Sessions, MD 04/09/17 2211

## 2017-04-09 NOTE — ED Triage Notes (Signed)
Pt arrived POV after patient's PCP wanted patient to come to ER to have CO2 level checked. Patient has been low 80s on RA, prescribed O2 but over the past two weeks has been lethargic, confused, not speaking. Patient NAD upon resting in the bed, but is having difficulty speaking in full sentences. States that it hurts to talk, which is a chronic problem, and that's why she is not talking but family states that it has not been an issue for her before. Patient using hand gestures, not speaking. Patient states she has not felt more tired or out of breath than usual.

## 2017-04-13 ENCOUNTER — Encounter: Payer: Self-pay | Admitting: Adult Health

## 2017-04-13 ENCOUNTER — Ambulatory Visit (INDEPENDENT_AMBULATORY_CARE_PROVIDER_SITE_OTHER): Payer: Medicare Other | Admitting: Adult Health

## 2017-04-13 VITALS — BP 124/62 | HR 72 | Ht 62.5 in | Wt 192.0 lb

## 2017-04-13 DIAGNOSIS — J449 Chronic obstructive pulmonary disease, unspecified: Secondary | ICD-10-CM

## 2017-04-13 DIAGNOSIS — J9612 Chronic respiratory failure with hypercapnia: Secondary | ICD-10-CM

## 2017-04-13 DIAGNOSIS — R0609 Other forms of dyspnea: Secondary | ICD-10-CM | POA: Diagnosis not present

## 2017-04-13 DIAGNOSIS — J9611 Chronic respiratory failure with hypoxia: Secondary | ICD-10-CM

## 2017-04-13 DIAGNOSIS — R0600 Dyspnea, unspecified: Secondary | ICD-10-CM

## 2017-04-13 NOTE — Assessment & Plan Note (Signed)
Doing well on O2 .  Cont on O2 at 2l/m

## 2017-04-13 NOTE — Assessment & Plan Note (Signed)
Continue on Stiolto  Refer to pulmonary rehab   Plan  Patient Instructions  Hold Fish all and flaxseed all Begin Prilosec 20 mg daily before a meal. This is over-the-counter. Begin Pepcid 20 mg at bedtime. This is over-the-counter. Begin Mucinex twice daily as needed for cough and congestion Follow med calendar closely and bring to each visit. Continue on Stiolto 2 puffs daily .  Continue on Oxygen 2 L Follow with Dr. Melvyn Novas in 2 months and as needed Please contact office for sooner follow up if symptoms do not improve or worsen or seek emergency care

## 2017-04-13 NOTE — Progress Notes (Signed)
@Patient  ID: [name], female    DOB: [birth year], 83 y.o.   MRN: [id number]  Chief Complaint  Patient presents with  . Follow-up    Dyspnea     Referring provider: Marin Olp, MD  HPI: 83 yo female former smoker , seen for pulmonary consult for dyspnea/cough  03/22/17.  Pt has moderate COPD (on PFT )  Hx of larynx cancer 1986 s/p radiation.   TEST  Spirometry 03/22/17 >FEV1 52%, ratio 64, FVC 60%.   04/13/2017 Follow up : COPD /O2 RF , /Cough /Med Review  Pt returns for 3 week follow up . Seen last ov for pulmonary consult for persistent cough and dyspnea. She is a former smoker . Spirometry in August showed Moderate COPD with FEV1 at 52%. Patient was started on a prednisone taper. Recommended to start on oxygen  2 L continuously. And started on Stiolto. She is feeling only mildly better. Cough is some better. She denies chest pain , orthopnea, edema or fever.  She is somewhat tearful as daughter died recently from ?COPD. Support provided. Since last visit. Patient did have an episode of feeling very weak and fatigued. She was seen at the emergency room. Workup was unrevealing. VBG did not show any significant hypercarbia. Chest x-ray with chronic changes. CBC nml .  We reviewed all her medications organize them into a medication count with patient education. Patient appears to be taking her medications correctly except that she had not started Protonix and Pepcid. Prescriptions were not sent to the pharmacy. We discussed getting over-the-counter medications, which be more affordable for her with Prilosec and Pepcid..  Allergies  Allergen Reactions  . Ativan [Lorazepam] Other (See Comments)    Severe hallucinations   . Propoxyphene Hcl   . Codeine Itching  . Penicillins Rash  . Vancomycin Rash    Immunization History  Administered Date(s) Administered  . Influenza, High Dose Seasonal PF 05/05/2013, 04/28/2016  . Influenza,inj,Quad PF,6+ Mos 04/24/2014, 05/06/2015   . Pneumococcal Conjugate-13 04/11/2015  . Pneumococcal Polysaccharide-23 05/05/2013  . Tdap 01/27/2011    Past Medical History:  Diagnosis Date  . ABDOMINAL INCISIONAL HERNIA 04/03/2010      . Anxiety   . Arthritis    "lots; hands" (05/26/2013)  . Blood transfusion    "w/1st miscarriage" (05/26/2013)  . COPD (chronic obstructive pulmonary disease) ([place])   . Coronary artery disease   . Depression   . Diverticulitis of colon with hemorrhage 07/24/2008  . DVT (deep venous thrombosis) ([place]) 1954   "after I had my first child" (05/26/2013)  . Exertional shortness of breath   . Family history of anesthesia complication    "sisters also get sick as a dog" (05/26/2013)  . GERD 05/03/2007      . GERD (gastroesophageal reflux disease)   . H/O hiatal hernia   . HCAP (healthcare-associated pneumonia) 06/26/2013  . Heart murmur    "when I was a child" (05/26/2013)  . History of TIAs   . Hyperlipidemia   . Hypertension   . Laryngeal carcinoma ([place]) hx of ca   remotely with resection and xrt/chronic hoarsemenss  . Lymphona, mantle cell, inguinal region/lower limb ([place])    "large c-cell" (05/26/2013)  . OSTEOARTHRITIS 05/03/2007  . PEPTIC ULCER DISEASE 07/06/2008  . Pneumonia 2013   "once" (05/26/2013)  . PUD (peptic ulcer disease)   . PVD (peripheral vascular disease) ([place])   . Seizures ([place])   . Stroke ([place]) 05/26/2013   "very mild; affected my  speech for a while" (05/26/2013)  . VARICOSE VEINS LOWER EXTREMITIES W/INFLAMMATION 01/15/2009    Tobacco History: History  Smoking Status  . Former Smoker  . Packs/day: 2.50  . Years: 37.00  . Types: Cigarettes  . Quit date: 09/05/1984  Smokeless Tobacco  . Never Used   Counseling given: Not Answered   Outpatient Encounter Prescriptions as of 04/13/2017  Medication Sig  . acetaminophen (TYLENOL) 650 MG CR tablet Take 650 mg by mouth every 8 (eight) hours as needed for pain. Reported on 12/09/2015  . ALPRAZolam (XANAX) 0.25 MG tablet  TAKE ONE TABLET BY MOUTH ONCE DAILY AS NEEDED FOR ANXIETY.  Marland Kitchen atorvastatin (LIPITOR) 20 MG tablet TAKE 1 TABLET BY MOUTH ONCE DAILY  . bisoprolol (ZEBETA) 5 MG tablet Take 1 tablet (5 mg total) by mouth daily.  . cholecalciferol (VITAMIN D) 1000 UNITS tablet Take 1,000 Units by mouth daily.  . Cyanocobalamin (VITAMIN B-12 PO) Take 1 tablet by mouth daily.  . diclofenac sodium (VOLTAREN) 1 % GEL Apply 2 g topically 4 (four) times daily.  . furosemide (LASIX) 20 MG tablet Take 1 tablet (20 mg total) by mouth daily as needed for edema.  . gabapentin (NEURONTIN) 100 MG capsule TAKE ONE CAPSULE BY MOUTH THREE TIMES DAILY (Patient taking differently: TAKE ONE CAPSULE BY MOUTH IN THE EVENINIG)  . levETIRAcetam (KEPPRA) 500 MG tablet TAKE 1 TABLET BY MOUTH TWICE DAILY  . loperamide (IMODIUM) 2 MG capsule Take 2 mg by mouth daily as needed for diarrhea or loose stools.   . Multiple Vitamins-Minerals (MULTIVITAMIN ADULTS PO) Take 1 tablet by mouth daily.  . predniSONE (DELTASONE) 10 MG tablet Take  4 each am x 2 days,   2 each am x 2 days,  1 each am x 2 days and stop  . pregabalin (LYRICA) 75 MG capsule Take 1 capsule (75 mg total) by mouth 2 (two) times daily.  . Tiotropium Bromide-Olodaterol (STIOLTO RESPIMAT) 2.5-2.5 MCG/ACT AERS Inhale 2 puffs into the lungs daily.  . VENTOLIN HFA 108 (90 Base) MCG/ACT inhaler Inhale 2 puffs into the lungs every 6 (six) hours as needed for wheezing or shortness of breath.   Alveda Reasons 15 MG TABS tablet TAKE ONE TABLET BY MOUTH ONCE DAILY WITH  SUPPER   No facility-administered encounter medications on file as of 04/13/2017.      Review of Systems  Constitutional:   No  weight loss, night sweats,  Fevers, chills, + fatigue, or  lassitude.  HEENT:   No headaches,  Difficulty swallowing,  Tooth/dental problems, or  Sore throat,                No sneezing, itching, ear ache,  +nasal congestion, post nasal drip,   CV:  No chest pain,  Orthopnea, PND, swelling in  lower extremities, anasarca, dizziness, palpitations, syncope.   GI  No heartburn, indigestion, abdominal pain, nausea, vomiting, diarrhea, change in bowel habits, loss of appetite, bloody stools.   Resp:    No chest wall deformity  Skin: no rash or lesions.  GU: no dysuria, change in color of urine, no urgency or frequency.  No flank pain, no hematuria   MS:  No joint pain or swelling.  No decreased range of motion.  No back pain.    Physical Exam  BP 124/62 (BP Location: Left Arm, Cuff Size: Normal)   Pulse 72   Ht 5' 2.5" (1.588 m)   Wt 192 lb (87.1 kg)   SpO2 96%  BMI 34.56 kg/m   GEN: A/Ox3; pleasant , NAD, elderly , on O2 and walks with rolling walker    HEENT:  [place]/AT,  EACs-clear, TMs-wnl, NOSE-clear, THROAT-clear, no lesions, no postnasal drip or exudate noted.   NECK:  Supple w/ fair ROM; no JVD; normal carotid impulses w/o bruits; no thyromegaly or nodules palpated; no lymphadenopathy.    RESP  Clear  P & A; w/o, wheezes/ rales/ or rhonchi. no accessory muscle use, no dullness to percussion  CARD:  RRR, no m/r/g, tr  peripheral edema, pulses intact, no cyanosis or clubbing.  GI:   Soft & nt; nml bowel sounds; no organomegaly or masses detected.   Musco: Warm bil, no deformities or joint swelling noted.   Neuro: alert, no focal deficits noted.    Skin: Warm, no lesions or rashes    Lab Results:  CBC    Component Value Date/Time   WBC 5.4 04/09/2017 2044   RBC 4.57 04/09/2017 2044   HGB 12.6 04/09/2017 2044   HGB 12.4 09/05/2012 0938   HCT 39.9 04/09/2017 2044   HCT 39.4 09/05/2012 0938   PLT 180 04/09/2017 2044   PLT 151 09/05/2012 0938   MCV 87.3 04/09/2017 2044   MCV 91.6 09/05/2012 0938   MCH 27.6 04/09/2017 2044   MCHC 31.6 04/09/2017 2044   RDW 14.9 04/09/2017 2044   RDW 14.3 09/05/2012 0938   LYMPHSABS 1.9 04/09/2017 2044   LYMPHSABS 0.8 (L) 09/05/2012 0938   MONOABS 0.4 04/09/2017 2044   MONOABS 0.6 09/05/2012 0938   EOSABS 0.2  04/09/2017 2044   EOSABS 0.2 09/05/2012 0938   BASOSABS 0.0 04/09/2017 2044   BASOSABS 0.0 09/05/2012 0938    BMET    Component Value Date/Time   NA 140 04/09/2017 2044   NA 139 07/06/2012 0949   K 3.9 04/09/2017 2044   K 4.4 07/06/2012 0949   CL 105 04/09/2017 2044   CL 105 07/06/2012 0949   CO2 27 04/09/2017 2044   CO2 21 (L) 07/06/2012 0949   GLUCOSE 98 04/09/2017 2044   GLUCOSE 118 (H) 07/06/2012 0949   BUN 12 04/09/2017 2044   BUN 11.0 07/06/2012 0949   CREATININE 0.97 04/09/2017 2044   CREATININE 1.0 07/06/2012 0949   CALCIUM 9.3 04/09/2017 2044   CALCIUM 9.3 07/06/2012 0949   GFRNONAA 51 (L) 04/09/2017 2044   GFRAA 59 (L) 04/09/2017 2044    BNP    Component Value Date/Time   BNP 423.6 (H) 11/20/2015 0833    ProBNP    Component Value Date/Time   PROBNP 243.0 (H) 03/22/2017 1149    Imaging: Dg Chest 2 View  Result Date: 04/09/2017 CLINICAL DATA:  Hypoxia. Prescribed oxygen last week for low oxygen saturations. Increased lethargy and confusion over 2 weeks. Cough for 3 months. Shortness of breath. Former smoker. EXAM: CHEST  2 VIEW COMPARISON:  03/22/2017 FINDINGS: Mild cardiac enlargement without pulmonary vascular congestion. Probable atelectasis in the left lung base. No consolidation or airspace disease. No blunting of costophrenic angles. No pneumothorax. Calcified and tortuous aorta. IMPRESSION: Cardiac enlargement. Atelectasis in the left base. No active disease. Electronically Signed   By: Lucienne Capers M.D.   On: 04/09/2017 21:12   Dg Chest 2 View  Result Date: 03/22/2017 CLINICAL DATA:  Three months of cough. Former smoker. History of COPD, previous episodes of pneumonia, coronary artery disease. EXAM: CHEST  2 VIEW COMPARISON:  PA and lateral chest x-ray of January 27, 2017 FINDINGS: The lungs are well-expanded. There  is subtle increased density lateral to the left heart border which is new. The right lung exhibits no acute abnormalities. The heart and  pulmonary vascularity are normal. There is calcification in the wall of the thoracic aorta. There is no pleural effusion. The bony thorax exhibits no acute abnormality. IMPRESSION: COPD-smoking related changes, stable. Acute atelectasis or early infiltrate lateral to the left heart border which likely lies in the anterior aspect of the lower lobe. Followup PA and lateral chest X-ray is recommended in 3-4 weeks following trial of antibiotic therapy to ensure resolution and exclude underlying malignancy. Thoracic aortic atherosclerosis. Electronically Signed   By: David  Martinique M.D.   On: 03/22/2017 14:04     Assessment & Plan:   COPD GOLD II Continue on Stiolto  Refer to pulmonary rehab   Plan  Patient Instructions  Hold Fish all and flaxseed all Begin Prilosec 20 mg daily before a meal. This is over-the-counter. Begin Pepcid 20 mg at bedtime. This is over-the-counter. Begin Mucinex twice daily as needed for cough and congestion Follow med calendar closely and bring to each visit. Continue on Stiolto 2 puffs daily .  Continue on Oxygen 2 L Follow with Dr. Melvyn Novas in 2 months and as needed Please contact office for sooner follow up if symptoms do not improve or worsen or seek emergency care      Chronic respiratory failure with hypoxia and hypercapnia ([place]) Doing well on O2 .  Cont on O2 at 2l/m   Dyspnea on exertion Suspect is secondary to Moderate COPD and decondiitoning . CXR , labs unrevealing .  Would cont on current regimen  Refer to pulm rehab      Rexene Edison, NP 04/13/2017

## 2017-04-13 NOTE — Progress Notes (Signed)
Chart and office note reviewed in detail  > agree with a/p as outlined    

## 2017-04-13 NOTE — Patient Instructions (Signed)
Hold Fish all and flaxseed all Begin Prilosec 20 mg daily before a meal. This is over-the-counter. Begin Pepcid 20 mg at bedtime. This is over-the-counter. Begin Mucinex twice daily as needed for cough and congestion Follow med calendar closely and bring to each visit. Continue on Stiolto 2 puffs daily .  Continue on Oxygen 2 L Follow with Dr. Melvyn Novas in 2 months and as needed Please contact office for sooner follow up if symptoms do not improve or worsen or seek emergency care

## 2017-04-13 NOTE — Assessment & Plan Note (Signed)
Suspect is secondary to Moderate COPD and decondiitoning . CXR , labs unrevealing .  Would cont on current regimen  Refer to pulm rehab

## 2017-04-15 NOTE — Addendum Note (Signed)
Addended by: Parke Poisson E on: 04/15/2017 09:55 AM   Modules accepted: Orders

## 2017-04-15 NOTE — Addendum Note (Signed)
Addended by: Della Goo C on: 04/15/2017 05:13 PM   Modules accepted: Orders

## 2017-04-20 ENCOUNTER — Telehealth: Payer: Self-pay | Admitting: Internal Medicine

## 2017-04-20 DIAGNOSIS — J9611 Chronic respiratory failure with hypoxia: Secondary | ICD-10-CM

## 2017-04-20 NOTE — Telephone Encounter (Signed)
Duplicate message. 

## 2017-04-20 NOTE — Telephone Encounter (Signed)
lmtcb X1 for pt. Need to make her aware of why we need to schedule before and after PFT.  Will order after speaking to pt.

## 2017-04-20 NOTE — Telephone Encounter (Signed)
Spirometry 03/22/2017  FEV1 0.81 (52%)  Ratio 64 with typical curvature off all rx  This is GOLD II copd

## 2017-04-20 NOTE — Telephone Encounter (Signed)
Spoke with [name] at rehab and she will relay message to [place]. She will call me back if there is a problem using the Spirometry from 03/22/2017.

## 2017-04-20 NOTE — Telephone Encounter (Signed)
Ok to order spirometry before and after then

## 2017-04-20 NOTE — Telephone Encounter (Signed)
Called [name], she states Medicare will not accept a COPD diagnosis because the pt has no PFT's. Can wee order PFT or give them another diagnosis? Perhaps we can use hypoxia with respiratory failure? MW please advise if you want to change DX code or corder PFT.

## 2017-04-20 NOTE — Telephone Encounter (Signed)
[facility]  That Medicare does not recognize spirometry unless it is post bronchodilator and documented.  This pt will need a new dx for pulmonary rehab. [phone]

## 2017-04-20 NOTE — Telephone Encounter (Signed)
Spoke with [employer]. She stated that Medicare will not pay for pulmonary rehab with a diagnosis on COPD Gold 2,3 or 4 without a PFT or a pre&post spirometry. Per Medicare guidelines, the spirometry can be used but it must clearly say pre&post.   She wants to know if MW would be willing to change the diagnosis so that Medicare will pay.   MW, please advise. Thanks!

## 2017-04-21 ENCOUNTER — Ambulatory Visit (INDEPENDENT_AMBULATORY_CARE_PROVIDER_SITE_OTHER): Payer: Medicare Other | Admitting: Adult Health

## 2017-04-21 VITALS — BP 130/70 | Temp 98.2°F | Ht 62.5 in | Wt 192.0 lb

## 2017-04-21 DIAGNOSIS — N39498 Other specified urinary incontinence: Secondary | ICD-10-CM | POA: Diagnosis not present

## 2017-04-21 NOTE — Progress Notes (Signed)
Subjective:    Patient ID: [name], female    DOB: 1928/04/06, 83 y.o.   MRN: [id number]  HPI  83 year old female who  has a past medical history of ABDOMINAL INCISIONAL HERNIA (04/03/2010); Anxiety; Arthritis; Blood transfusion; COPD (chronic obstructive pulmonary disease) ([place]); Coronary artery disease; Depression; Diverticulitis of colon with hemorrhage (07/24/2008); DVT (deep venous thrombosis) ([place]) (1954); Exertional shortness of breath; Family history of anesthesia complication; GERD (8/85/0277); GERD (gastroesophageal reflux disease); H/O hiatal hernia; HCAP (healthcare-associated pneumonia) (06/26/2013); Heart murmur; History of TIAs; Hyperlipidemia; Hypertension; Laryngeal carcinoma ([facility]) (hx of ca); Lymphona, mantle cell, inguinal region/lower limb ([place]); OSTEOARTHRITIS (05/03/2007); PEPTIC ULCER DISEASE (07/06/2008); Pneumonia (2013); PUD (peptic ulcer disease); PVD (peripheral vascular disease) ([place]); Seizures ([place]); Stroke ([place]) (05/26/2013); and VARICOSE VEINS LOWER EXTREMITIES W/INFLAMMATION (01/15/2009).  She presents to the office today for an issue of urinary incontinence. She reports first noticing her incontinence yesterday. Her husband reports that he noticed about 2 weeks ago. She denies any dysuria, hematuria, or feeling as though she is not emptying her bladder completely. She does report frequency and the "urge to go"     Review of Systems See HPI   Past Medical History:  Diagnosis Date  . ABDOMINAL INCISIONAL HERNIA 04/03/2010      . Anxiety   . Arthritis    "lots; hands" (05/26/2013)  . Blood transfusion    "w/1st miscarriage" (05/26/2013)  . COPD (chronic obstructive pulmonary disease) ([place])   . Coronary artery disease   . Depression   . Diverticulitis of colon with hemorrhage 07/24/2008  . DVT (deep venous thrombosis) ([place]) 1954   "after I had my first child" (05/26/2013)  . Exertional shortness of breath   . Family history of anesthesia complication     "sisters also get sick as a dog" (05/26/2013)  . GERD 05/03/2007      . GERD (gastroesophageal reflux disease)   . H/O hiatal hernia   . HCAP (healthcare-associated pneumonia) 06/26/2013  . Heart murmur    "when I was a child" (05/26/2013)  . History of TIAs   . Hyperlipidemia   . Hypertension   . Laryngeal carcinoma ([place]) hx of ca   remotely with resection and xrt/chronic hoarsemenss  . Lymphona, mantle cell, inguinal region/lower limb ([place])    "large c-cell" (05/26/2013)  . OSTEOARTHRITIS 05/03/2007  . PEPTIC ULCER DISEASE 07/06/2008  . Pneumonia 2013   "once" (05/26/2013)  . PUD (peptic ulcer disease)   . PVD (peripheral vascular disease) ([place])   . Seizures ([place])   . Stroke ([place]) 05/26/2013   "very mild; affected my speech for a while" (05/26/2013)  . VARICOSE VEINS LOWER EXTREMITIES W/INFLAMMATION 01/15/2009    Social History   Social History  . Marital status: Married    Spouse name: N/A  . Number of children: 6  . Years of education: N/A   Occupational History  . retired Retired   Social History Main Topics  . Smoking status: Former Smoker    Packs/day: 2.50    Years: 37.00    Types: Cigarettes    Quit date: 09/05/1984  . Smokeless tobacco: Never Used  . Alcohol use No  . Drug use: No  . Sexual activity: No   Other Topics Concern  . Not on file   Social History Narrative   Married (husband patient of Dr. Yong Channel). 6 children (lost 2 for total 8). 15 grandkids. 6 greatgrandchildren.    Lives with husband. Husband drives. Patient does not drive.  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     Past Surgical History:  Procedure Laterality Date  . ABDOMINAL EXPLORATION SURGERY  12/01/2012  . APPENDECTOMY  09/13/2008  . CAROTID ENDARTERECTOMY Bilateral 2004   2004 a month apart right and left  . CARPAL TUNNEL RELEASE Left ?1960's  . CATARACT EXTRACTION  2005   bilateral  . COLON SURGERY   09/13/2008   Laparoscopic assisted converted to open sigmoid colectomy.Archie Endo 09/13/2008 (05/26/2013)  . DILATION AND CURETTAGE OF UTERUS  1950's   "had 2 for miscarriages" (05/26/2013)  . DIVERTING ILEOSTOMY  09/22/2008   iliostomy for diverticulitis/notes 09/22/2008 (05/26/2013)  . HERNIA REPAIR  05/2010   ventral hernia repair  . ILEOSTOMY CLOSURE  01/2010  . RADICAL NECK DISSECTION  1986   "larynx cancer" (05/26/2013)  . TONSILLECTOMY AND ADENOIDECTOMY  1936   "tonsils grew back; no 2nd OR" (05/26/2013)  . TOTAL KNEE ARTHROPLASTY Bilateral    2002 left 2008 right  . TUBAL LIGATION  1972    Family History  Problem Relation Age of Onset  . Colon cancer Sister        colon  . Hyperlipidemia Mother   . Stroke Mother   . Cancer Father        mesothelioma    Allergies  Allergen Reactions  . Ativan [Lorazepam] Other (See Comments)    Severe hallucinations   . Propoxyphene Hcl   . Codeine Itching  . Penicillins Rash  . Vancomycin Rash    Current Outpatient Prescriptions on File Prior to Visit  Medication Sig Dispense Refill  . acetaminophen (TYLENOL) 650 MG CR tablet Take 650 mg by mouth every 8 (eight) hours as needed for pain. Reported on 12/09/2015    . atorvastatin (LIPITOR) 20 MG tablet TAKE 1 TABLET BY MOUTH ONCE DAILY 30 tablet 5  . bisoprolol (ZEBETA) 5 MG tablet Take 1 tablet (5 mg total) by mouth daily. 30 tablet 5  . cholecalciferol (VITAMIN D) 1000 UNITS tablet Take 1,000 Units by mouth daily.    . Cyanocobalamin (VITAMIN B-12 PO) Take 1 tablet by mouth daily.    . furosemide (LASIX) 20 MG tablet Take 1 tablet (20 mg total) by mouth daily as needed for edema. 30 tablet 2  . gabapentin (NEURONTIN) 100 MG capsule TAKE ONE CAPSULE BY MOUTH THREE TIMES DAILY (Patient taking differently: TAKE ONE CAPSULE BY MOUTH IN THE EVENINIG) 270 capsule 1  . levETIRAcetam (KEPPRA) 500 MG tablet TAKE 1 TABLET BY MOUTH TWICE DAILY 60 tablet 3  . loperamide (IMODIUM) 2 MG capsule Take 2  mg by mouth daily as needed for diarrhea or loose stools.     . Tiotropium Bromide-Olodaterol (STIOLTO RESPIMAT) 2.5-2.5 MCG/ACT AERS Inhale 2 puffs into the lungs daily. 1 Inhaler 6  . VENTOLIN HFA 108 (90 Base) MCG/ACT inhaler Inhale 2 puffs into the lungs every 6 (six) hours as needed for wheezing or shortness of breath.     Alveda Reasons 15 MG TABS tablet TAKE ONE TABLET BY MOUTH ONCE DAILY WITH  SUPPER 90 tablet 3   No current facility-administered medications on file prior to visit.     BP 130/70 (BP Location: Left Arm)   Temp 98.2 F (36.8 C) (Oral)   Ht 5' 2.5" (1.588 m)   Wt 192 lb (87.1 kg)   BMI 34.56 kg/m       Objective:   Physical Exam  Constitutional:  She is oriented to person, place, and time. She appears well-developed and well-nourished. No distress.  Cardiovascular: Normal rate, regular rhythm, normal heart sounds and intact distal pulses.  Exam reveals no gallop and no friction rub.   No murmur heard. Pulmonary/Chest: Effort normal and breath sounds normal. No respiratory distress. She has no wheezes. She has no rales. She exhibits no tenderness.  Abdominal: Soft. Bowel sounds are normal. She exhibits no distension and no mass. There is no tenderness. There is no rebound, no guarding and no CVA tenderness.  Neurological: She is alert and oriented to person, place, and time.  Skin: Skin is warm and dry. No rash noted. She is not diaphoretic. No erythema. No pallor.  Psychiatric: She has a normal mood and affect. Her behavior is normal. Judgment and thought content normal.  Nursing note and vitals reviewed.      Assessment & Plan:  1. Other urinary incontinence - Exam more consistent with urge incontinence. Her QT interval is normal and most recent GFR was 59. I spoke with her PCP, Dr. Yong Channel who is ok with her trying a sample of Myrbetriq. She is to follow up in 2 weeks with him  - POC Urinalysis Dipstick- she was unable to void in the office  - Sample of Myrbetriq  25 mg given to patient to try   Dorothyann Peng, NP

## 2017-04-23 ENCOUNTER — Telehealth: Payer: Self-pay | Admitting: Internal Medicine

## 2017-04-23 NOTE — Telephone Encounter (Signed)
Called and spoke with pt and she is aware of PFT   Before and after that needs to be scheduled.  This has been done and pt is aware of appt for next Tuesday at 3.

## 2017-04-23 NOTE — Telephone Encounter (Signed)
[facility] but they are closed. Left voicemail on machine to call back on Monday. Pt has appt for spirometry on Tuesday. See previous note.

## 2017-04-26 ENCOUNTER — Telehealth: Payer: Self-pay | Admitting: Nurse Practitioner

## 2017-04-26 ENCOUNTER — Other Ambulatory Visit: Payer: Self-pay | Admitting: Internal Medicine

## 2017-04-26 ENCOUNTER — Ambulatory Visit (HOSPITAL_BASED_OUTPATIENT_CLINIC_OR_DEPARTMENT_OTHER): Payer: Medicare Other | Admitting: Nurse Practitioner

## 2017-04-26 ENCOUNTER — Telehealth: Payer: Self-pay | Admitting: Adult Health

## 2017-04-26 VITALS — BP 148/71 | HR 75 | Temp 98.8°F | Resp 16 | Ht 62.5 in | Wt 191.0 lb

## 2017-04-26 DIAGNOSIS — R0600 Dyspnea, unspecified: Secondary | ICD-10-CM

## 2017-04-26 DIAGNOSIS — Z8572 Personal history of non-Hodgkin lymphomas: Secondary | ICD-10-CM

## 2017-04-26 DIAGNOSIS — Z8521 Personal history of malignant neoplasm of larynx: Secondary | ICD-10-CM | POA: Diagnosis not present

## 2017-04-26 DIAGNOSIS — Z23 Encounter for immunization: Secondary | ICD-10-CM

## 2017-04-26 MED ORDER — INFLUENZA VAC SPLIT QUAD 0.5 ML IM SUSY
0.5000 mL | PREFILLED_SYRINGE | Freq: Once | INTRAMUSCULAR | Status: AC
  Administered 2017-04-26: 0.5 mL via INTRAMUSCULAR
  Filled 2017-04-26: qty 0.5

## 2017-04-26 NOTE — Telephone Encounter (Signed)
FYI PCC's 

## 2017-04-26 NOTE — Telephone Encounter (Signed)
Spoke with [facility], spirometry would not be enough for Medicare to pay for pulmonary rehab. I changed diagnosis and re-sent referral.

## 2017-04-26 NOTE — Progress Notes (Addendum)
[facility] OFFICE PROGRESS NOTE   Diagnosis:  Non-Hodgkin's lymphoma  INTERVAL HISTORY:   [name] returns as scheduled. She reports she is undergoing treatment for "bronchitis". Cough is better. She denies significant shortness of breath. She utilizes supplemental oxygen 2 L. No fevers or sweats. Weight is stable.  Objective:  Vital signs in last 24 hours:  Blood pressure (!) 148/71, pulse 75, temperature 98.8 F (37.1 C), temperature source Oral, resp. rate 16, height 5' 2.5" (1.588 m), weight 191 lb (86.6 kg), SpO2 93 %, peak flow (!) 2 L/min.    HEENT: White coating over tongue. No buccal thrush. Lymphatics: No palpable cervical, supra clavicular, axillary or inguinal lymph nodes. Resp: Distant breath sounds. No respiratory distress. Cardio: Regular rate and rhythm. GI: No hepatosplenomegaly. Vascular: No leg edema.    Lab Results:  Lab Results  Component Value Date   WBC 5.4 04/09/2017   HGB 12.6 04/09/2017   HCT 39.9 04/09/2017   MCV 87.3 04/09/2017   PLT 180 04/09/2017   NEUTROABS 2.9 04/09/2017    Imaging:  No results found.  Medications: I have reviewed the patient's current medications.  Assessment/Plan: 1. Non-Hodgkin's lymphoma, large B-cell lymphoma, CD20 positive involving 8 needle core biopsy of a right iliac lymph node 02/12/2012. CT of the abdomen and pelvis on 01/15/2012 showed abdominal/pelvic lymphadenopathy. Serum LDH in normal range on 02/25/2012. Staging PET scan 02/25/2012 with a small lymph node posteriolateral to the oropharynx on the left measuring 9 x 7 mm with SUV 5.4; large cluster of hypermetabolic lymph nodes in the right hilum with SUV 4.3; cluster of mildly enlarged hepatoduodenal ligament lymph nodes and mildly enlarged portacaval lymph node with hypermetabolic activity (SUV max 37.1); numerous borderline enlarged and mildly enlarged periaortic retroperitoneal lymph nodes with extensive hypermetabolic activity (SUV  max 43.6-47.6); numerous borderline enlarged and mildly enlarged hypermetabolic lymph nodes along the pelvic sidewall bilaterally most pronounced on the right with a right external iliac lymph node measuring 2.9 x 4.1 cm (SUV max 52.5). Bone marrow aspiration/biopsy 03/09/2012 negative for involvement with lymphoma. Status post cycle 1 of CHOP-Rituxan on 03/15/2012. She completed cycle 3 on 05/04/2012 and cycle 4 on 05/25/2012. Restaging PET scan on 06/10/2012 was markedly improved with resolution of the thoracic and retroperitoneal adenopathy and near complete resolution of porta hepatis adenopathy. She completed cycle 6 of CHOP/Rituxan on 07/06/2012. 2. History of night sweats likely secondary to #1. 3. Rectal bleeding June 2013 status post negative colonoscopy 01/28/2012 and upper endoscopy 02/11/2012. The bleeding was likely related to hemorrhoids. 4. COPD. 5. Remote history of larynx cancer. 6. Status post Port-A-Cath placement 03/09/2012. Removed 09/13/2012. 7. 2-D echo 03/11/2012 with left ventricular ejection fraction 60-65%. 8. Admission with febrile neutropenia 03/22/2012 with no source for infection identified 9. Admission with fever and a chest x-ray consistent with "pneumonia"on 03/27/2012. She completed a course of Levaquin. 10. Pancytopenia following cycle 1 of CHOP-rituximab-resolved. 11. History of atrial fibrillation-maintained on xarelto 12. Numbness/tingling in the fingertips and toes. The numbness in the toes predated the start of chemotherapy.. She also reports the tingling in the fingertips was present prior to the start of chemotherapy. The vincristine was dose reduced with cycle 2. Vincristine was discontinued beginning with cycle 3. 13. Cardioembolic strokes, seizures 2014. She is followed by neurology. 71. Hospitalization with pneumonia November 2014 and April 2017    Disposition: [name] remains in clinical remission from non-Hodgkin's lymphoma. She would like to  continue follow-up at the [facility]. She will  return for an office visit in 8 months. We administered the influenza vaccine at today's visit.  Patient seen with Dr. Benay Spice.    [name] ANP/GNP-BC   04/26/2017  12:17 PM This was a shared visit with [name]. [name] would like to continue follow-up at the [facility]. She remains in remission from lymphoma.  Julieanne Manson, M.D.

## 2017-04-26 NOTE — Telephone Encounter (Signed)
Scheduled appt per 9/24 los - f/u in 8 months. - gave patient AVS and calender per los.

## 2017-04-26 NOTE — Telephone Encounter (Signed)
[name] [facility] came to me with a note from [place] - "pt needs to be walked to prove she still needs O2 off of prednisone." Pt is scheduled for a B&A tomorrow 9/25 Called spoke with patient to see if she can come earlier/later than her B&A so that she may be walked - pt okay with coming at 1430.  There should be no charge for this visit  **NOTE: pt was not walked at the time of her last ov because it had been 3 weeks since her last, however those qualifying sats are now outside that 30 day window**

## 2017-04-26 NOTE — Addendum Note (Signed)
Addended by: Jannette Spanner on: 04/26/2017 09:26 AM   Modules accepted: Orders

## 2017-04-27 ENCOUNTER — Ambulatory Visit (INDEPENDENT_AMBULATORY_CARE_PROVIDER_SITE_OTHER): Payer: Medicare Other | Admitting: Internal Medicine

## 2017-04-27 ENCOUNTER — Ambulatory Visit (INDEPENDENT_AMBULATORY_CARE_PROVIDER_SITE_OTHER): Payer: Medicare Other | Admitting: *Deleted

## 2017-04-27 DIAGNOSIS — J9611 Chronic respiratory failure with hypoxia: Secondary | ICD-10-CM

## 2017-04-27 DIAGNOSIS — R0600 Dyspnea, unspecified: Secondary | ICD-10-CM | POA: Diagnosis not present

## 2017-04-27 DIAGNOSIS — J9612 Chronic respiratory failure with hypercapnia: Secondary | ICD-10-CM

## 2017-04-27 LAB — PULMONARY FUNCTION TEST
FEF 25-75 PRE: 1.37 L/s
FEF 25-75 Post: 0.49 L/sec
FEF2575-%CHANGE-POST: -64 %
FEF2575-%PRED-POST: 51 %
FEF2575-%Pred-Pre: 143 %
FEV1-%CHANGE-POST: -16 %
FEV1-%PRED-POST: 81 %
FEV1-%Pred-Pre: 98 %
FEV1-PRE: 1.51 L
FEV1-Post: 1.26 L
FEV1FVC-%Change-Post: -2 %
FEV1FVC-%Pred-Pre: 108 %
FEV6-%Change-Post: -14 %
FEV6-%PRED-POST: 83 %
FEV6-%Pred-Pre: 97 %
FEV6-POST: 1.65 L
FEV6-Pre: 1.93 L
FEV6FVC-%PRED-POST: 107 %
FEV6FVC-%Pred-Pre: 107 %
FVC-%CHANGE-POST: -14 %
FVC-%PRED-POST: 78 %
FVC-%PRED-PRE: 91 %
FVC-Post: 1.65 L
FVC-Pre: 1.93 L
POST FEV1/FVC RATIO: 76 %
PRE FEV6/FVC RATIO: 100 %
Post FEV6/FVC ratio: 100 %
Pre FEV1/FVC ratio: 78 %

## 2017-04-27 NOTE — Progress Notes (Signed)
Spirometry pre and post done today. 

## 2017-04-27 NOTE — Telephone Encounter (Signed)
[facility] pulmonary rehab, they are currently at lunch. WCB.

## 2017-04-28 NOTE — Telephone Encounter (Signed)
Pulmonary Rehab does not work on Wednesdays. Will call back tomorrow.

## 2017-04-28 NOTE — Progress Notes (Signed)
Spoke with pt and notified of results per Dr. Wert. Pt verbalized understanding and denied any questions. 

## 2017-04-29 NOTE — Telephone Encounter (Signed)
Lm for [place] with pulmonary rehab.

## 2017-04-30 ENCOUNTER — Telehealth: Payer: Self-pay | Admitting: Neurology

## 2017-04-30 ENCOUNTER — Institutional Professional Consult (permissible substitution): Payer: Medicare Other | Admitting: Internal Medicine

## 2017-04-30 ENCOUNTER — Encounter (HOSPITAL_COMMUNITY): Payer: Self-pay

## 2017-04-30 ENCOUNTER — Emergency Department (HOSPITAL_COMMUNITY)
Admission: EM | Admit: 2017-04-30 | Discharge: 2017-04-30 | Disposition: A | Discharge status: Home / Self Care | Payer: Medicare Other | Source: Home / Self Care | Attending: Emergency Medicine | Admitting: Emergency Medicine

## 2017-04-30 ENCOUNTER — Emergency Department (HOSPITAL_COMMUNITY): Payer: Medicare Other

## 2017-04-30 DIAGNOSIS — Z87891 Personal history of nicotine dependence: Secondary | ICD-10-CM | POA: Insufficient documentation

## 2017-04-30 DIAGNOSIS — I2510 Atherosclerotic heart disease of native coronary artery without angina pectoris: Secondary | ICD-10-CM

## 2017-04-30 DIAGNOSIS — R404 Transient alteration of awareness: Secondary | ICD-10-CM

## 2017-04-30 DIAGNOSIS — Z79899 Other long term (current) drug therapy: Secondary | ICD-10-CM

## 2017-04-30 DIAGNOSIS — N183 Chronic kidney disease, stage 3 (moderate): Secondary | ICD-10-CM

## 2017-04-30 DIAGNOSIS — J449 Chronic obstructive pulmonary disease, unspecified: Secondary | ICD-10-CM

## 2017-04-30 DIAGNOSIS — Z7901 Long term (current) use of anticoagulants: Secondary | ICD-10-CM

## 2017-04-30 DIAGNOSIS — Z96653 Presence of artificial knee joint, bilateral: Secondary | ICD-10-CM | POA: Insufficient documentation

## 2017-04-30 DIAGNOSIS — N390 Urinary tract infection, site not specified: Secondary | ICD-10-CM | POA: Insufficient documentation

## 2017-04-30 DIAGNOSIS — R4182 Altered mental status, unspecified: Secondary | ICD-10-CM | POA: Diagnosis not present

## 2017-04-30 DIAGNOSIS — I130 Hypertensive heart and chronic kidney disease with heart failure and stage 1 through stage 4 chronic kidney disease, or unspecified chronic kidney disease: Secondary | ICD-10-CM | POA: Insufficient documentation

## 2017-04-30 DIAGNOSIS — Z8572 Personal history of non-Hodgkin lymphomas: Secondary | ICD-10-CM

## 2017-04-30 DIAGNOSIS — I5032 Chronic diastolic (congestive) heart failure: Secondary | ICD-10-CM

## 2017-04-30 DIAGNOSIS — R4189 Other symptoms and signs involving cognitive functions and awareness: Secondary | ICD-10-CM

## 2017-04-30 DIAGNOSIS — N3000 Acute cystitis without hematuria: Secondary | ICD-10-CM | POA: Diagnosis not present

## 2017-04-30 DIAGNOSIS — R7989 Other specified abnormal findings of blood chemistry: Secondary | ICD-10-CM

## 2017-04-30 LAB — CBC
HCT: 37.1 % (ref 36.0–46.0)
Hemoglobin: 11.9 g/dL — ABNORMAL LOW (ref 12.0–15.0)
MCH: 27.7 pg (ref 26.0–34.0)
MCHC: 32.1 g/dL (ref 30.0–36.0)
MCV: 86.3 fL (ref 78.0–100.0)
PLATELETS: 146 10*3/uL — AB (ref 150–400)
RBC: 4.3 MIL/uL (ref 3.87–5.11)
RDW: 14.7 % (ref 11.5–15.5)
WBC: 6.3 10*3/uL (ref 4.0–10.5)

## 2017-04-30 LAB — URINALYSIS, ROUTINE W REFLEX MICROSCOPIC
BILIRUBIN URINE: NEGATIVE
GLUCOSE, UA: NEGATIVE mg/dL
HGB URINE DIPSTICK: NEGATIVE
Ketones, ur: NEGATIVE mg/dL
NITRITE: NEGATIVE
PH: 6 (ref 5.0–8.0)
Protein, ur: NEGATIVE mg/dL
SPECIFIC GRAVITY, URINE: 1.011 (ref 1.005–1.030)

## 2017-04-30 LAB — COMPREHENSIVE METABOLIC PANEL
ALT: 9 U/L — ABNORMAL LOW (ref 14–54)
ANION GAP: 8 (ref 5–15)
AST: 12 U/L — ABNORMAL LOW (ref 15–41)
Albumin: 3.7 g/dL (ref 3.5–5.0)
Alkaline Phosphatase: 71 U/L (ref 38–126)
BUN: 14 mg/dL (ref 6–20)
CALCIUM: 8.9 mg/dL (ref 8.9–10.3)
CO2: 27 mmol/L (ref 22–32)
CREATININE: 1.32 mg/dL — AB (ref 0.44–1.00)
Chloride: 106 mmol/L (ref 101–111)
GFR, EST AFRICAN AMERICAN: 41 mL/min — AB (ref >60)
GFR, EST NON AFRICAN AMERICAN: 35 mL/min — AB (ref >60)
Glucose, Bld: 118 mg/dL — ABNORMAL HIGH (ref 65–99)
Potassium: 3.7 mmol/L (ref 3.5–5.1)
Sodium: 141 mmol/L (ref 135–145)
Total Bilirubin: 1.3 mg/dL — ABNORMAL HIGH (ref 0.3–1.2)
Total Protein: 7.3 g/dL (ref 6.5–8.1)

## 2017-04-30 LAB — CBG MONITORING, ED: GLUCOSE-CAPILLARY: 113 mg/dL — AB (ref 65–99)

## 2017-04-30 LAB — I-STAT CG4 LACTIC ACID, ED: Lactic Acid, Venous: 0.94 mmol/L (ref 0.5–1.9)

## 2017-04-30 MED ORDER — CEPHALEXIN 500 MG PO CAPS: 500.0000 mg | ORAL_CAPSULE | Freq: Two times a day (BID) | ORAL | 0 refills | Status: DC

## 2017-04-30 MED ORDER — CEPHALEXIN 500 MG PO CAPS
500.0000 mg | ORAL_CAPSULE | Freq: Once | ORAL | Status: AC
  Administered 2017-04-30: 500 mg via ORAL
  Filled 2017-04-30: qty 1

## 2017-04-30 NOTE — Telephone Encounter (Signed)
Patient's husband Percell Miller) called regarding his wife [name]. She was taken to the ER early this morning due to not knowing anything or what was going on. She also was talking "Gibberish". She was found past out in her walking chair early this morning.  I have her on a wait list. Is there anywhere she can be seen sooner? Please Advise. Thanks

## 2017-04-30 NOTE — ED Provider Notes (Signed)
[facility] DEPT Provider Note   CSN: [id number] Arrival date & time: 04/30/17  0840     History   Chief Complaint Chief Complaint  Patient presents with  . Altered Mental Status    HPI [name] is a 83 y.o. female.  Patient is an 83 year old female with an extensive past medical history including Non Hodgkins lymphoma, hypertension, hyperlipidemia, COPD on home oxygen, DVT, atrial fibrillation on Xarelto, seizures who presents with altered mental status.Per the patient's daughter, she found the patient sitting in her walker which also has a chair attached to it in front of the refrigerator unresponsive this morning. When the daughter found her, she was slumped back in the walker/chair not responding. She had rule out the side of her mouth. EMS was called and once they arrived she was awake and answering questions but more somnolent than normal.  Following this, patient became more responsive and answering questions appropriately per EMS.  Family states that she's been a little bit more sleepy over the last week. They also noted her to have an episode of right facial drooping with slurred speech which occurred about 4 days ago and lasted about 90 seconds. She was not evaluated for this. She does have a history of seizures but no seizure activity was noted this morning.      Past Medical History:  Diagnosis Date  . ABDOMINAL INCISIONAL HERNIA 04/03/2010      . Anxiety   . Arthritis    "lots; hands" (05/26/2013)  . Blood transfusion    "w/1st miscarriage" (05/26/2013)  . COPD (chronic obstructive pulmonary disease) ([place])   . Coronary artery disease   . Depression   . Diverticulitis of colon with hemorrhage 07/24/2008  . DVT (deep venous thrombosis) ([place]) 1954   "after I had my first child" (05/26/2013)  . Exertional shortness of breath   . Family history of anesthesia complication    "sisters also get sick as a dog" (05/26/2013)  . GERD 05/03/2007      . GERD  (gastroesophageal reflux disease)   . H/O hiatal hernia   . HCAP (healthcare-associated pneumonia) 06/26/2013  . Heart murmur    "when I was a child" (05/26/2013)  . History of TIAs   . Hyperlipidemia   . Hypertension   . Laryngeal carcinoma ([place]) hx of ca   remotely with resection and xrt/chronic hoarsemenss  . Lymphona, mantle cell, inguinal region/lower limb ([place])    "large c-cell" (05/26/2013)  . OSTEOARTHRITIS 05/03/2007  . PEPTIC ULCER DISEASE 07/06/2008  . Pneumonia 2013   "once" (05/26/2013)  . PUD (peptic ulcer disease)   . PVD (peripheral vascular disease) ([place])   . Seizures ([place])   . Stroke ([place]) 05/26/2013   "very mild; affected my speech for a while" (05/26/2013)  . VARICOSE VEINS LOWER EXTREMITIES W/INFLAMMATION 01/15/2009    Patient Active Problem List   Diagnosis Date Noted  . Chronic respiratory failure with hypoxia and hypercapnia ([place]) 03/22/2017  . Aortic atherosclerosis ([place]) 06/30/2016  . Diastolic CHF ([place]) 44/31/5400  . Bacteremia due to Streptococcus 11/21/2015  . Cellulitis of leg, right 11/20/2015  . History of cardioembolic stroke 86/76/1950  . Diverticulitis of colon 09/26/2014  . CKD (chronic kidney disease), stage III 09/26/2014  . Laryngeal carcinoma ([place])   . Spinal stenosis, lumbar region, with neurogenic claudication 05/09/2014  . Weakness generalized 06/30/2013  . History of Focal seizure ([place]) 06/16/2013  . TIA (transient ischemic attack) 05/31/2013  . Bilateral hand pain 01/09/2013  .  History of Large B-cell lymphoma ([place]) 02/18/2012  . Atrial fibrillation ([place]) 12/14/2008  . Anxiety state 10/20/2008  . Depression 10/20/2008  . Dyspnea on exertion 07/06/2008  . COPD GOLD II 03/26/2008  . Hyperlipidemia 03/18/2007  . Essential hypertension 03/18/2007    Past Surgical History:  Procedure Laterality Date  . ABDOMINAL EXPLORATION SURGERY  12/01/2012  . APPENDECTOMY  09/13/2008  . CAROTID ENDARTERECTOMY Bilateral 2004   2004 a month  apart right and left  . CARPAL TUNNEL RELEASE Left ?1960's  . CATARACT EXTRACTION  2005   bilateral  . COLON SURGERY  09/13/2008   Laparoscopic assisted converted to open sigmoid colectomy.Archie Endo 09/13/2008 (05/26/2013)  . DILATION AND CURETTAGE OF UTERUS  1950's   "had 2 for miscarriages" (05/26/2013)  . DIVERTING ILEOSTOMY  09/22/2008   iliostomy for diverticulitis/notes 09/22/2008 (05/26/2013)  . HERNIA REPAIR  05/2010   ventral hernia repair  . ILEOSTOMY CLOSURE  01/2010  . RADICAL NECK DISSECTION  1986   "larynx cancer" (05/26/2013)  . TONSILLECTOMY AND ADENOIDECTOMY  1936   "tonsils grew back; no 2nd OR" (05/26/2013)  . TOTAL KNEE ARTHROPLASTY Bilateral    2002 left 2008 right  . TUBAL LIGATION  1972    OB History    No data available       Home Medications    Prior to Admission medications   Medication Sig Start Date End Date Taking? Authorizing Provider  acetaminophen (TYLENOL) 650 MG CR tablet Take 650 mg by mouth every 8 (eight) hours as needed for pain. Reported on 12/09/2015   Yes [provider]  atorvastatin (LIPITOR) 20 MG tablet TAKE 1 TABLET BY MOUTH ONCE DAILY 04/06/17  Yes Marin Olp, MD  bisoprolol (ZEBETA) 5 MG tablet Take 1 tablet (5 mg total) by mouth daily. 04/28/16  Yes Marin Olp, MD  cholecalciferol (VITAMIN D) 1000 UNITS tablet Take 1,000 Units by mouth daily.   Yes [provider]  Cyanocobalamin (VITAMIN B-12 PO) Take 1 tablet by mouth daily.   Yes [provider]  famotidine (PEPCID) 20 MG tablet Take 20 mg by mouth at bedtime.   Yes [provider]  furosemide (LASIX) 20 MG tablet Take 1 tablet (20 mg total) by mouth daily as needed for edema. 04/28/16  Yes Marin Olp, MD  gabapentin (NEURONTIN) 100 MG capsule TAKE ONE CAPSULE BY MOUTH THREE TIMES DAILY Patient taking differently: TAKE ONE CAPSULE BY MOUTH IN THE EVENINIG 10/19/16  Yes Jaffe, Adam R, DO  levETIRAcetam (KEPPRA) 500 MG tablet TAKE 1  TABLET BY MOUTH TWICE DAILY 01/18/17  Yes Tomi Likens, Adam R, DO  loperamide (IMODIUM) 2 MG capsule Take 2 mg by mouth daily as needed for diarrhea or loose stools.    Yes [provider]  Tiotropium Bromide-Olodaterol (STIOLTO RESPIMAT) 2.5-2.5 MCG/ACT AERS Inhale 2 puffs into the lungs daily. 03/22/17  Yes Tanda Rockers, MD  VENTOLIN HFA 108 (90 Base) MCG/ACT inhaler Inhale 2 puffs into the lungs every 6 (six) hours as needed for wheezing or shortness of breath.  12/31/16  Yes [provider]  XARELTO 15 MG TABS tablet TAKE ONE TABLET BY MOUTH ONCE DAILY WITH  SUPPER 10/14/16  Yes Marin Olp, MD  cephALEXin (KEFLEX) 500 MG capsule Take 1 capsule (500 mg total) by mouth 2 (two) times daily. 04/30/17   Malvin Johns, MD    Family History Family History  Problem Relation Age of Onset  . Colon cancer Sister  colon  . Hyperlipidemia Mother   . Stroke Mother   . Cancer Father        mesothelioma    Social History Social History  Substance Use Topics  . Smoking status: Former Smoker    Packs/day: 2.50    Years: 37.00    Types: Cigarettes    Quit date: 09/05/1984  . Smokeless tobacco: Never Used  . Alcohol use No     Allergies   Ativan [lorazepam]; Propoxyphene hcl; Codeine; Penicillins; and Vancomycin   Review of Systems Review of Systems  Constitutional: Positive for fatigue. Negative for chills, diaphoresis and fever.  HENT: Negative for congestion, rhinorrhea and sneezing.   Eyes: Negative.   Respiratory: Negative for cough, chest tightness and shortness of breath.   Cardiovascular: Negative for chest pain and leg swelling.  Gastrointestinal: Negative for abdominal pain, blood in stool, diarrhea, nausea and vomiting.  Genitourinary: Negative for difficulty urinating, flank pain, frequency and hematuria.  Musculoskeletal: Negative for arthralgias and back pain.  Skin: Negative for rash.  Neurological: Positive for speech difficulty (4 days ago).  Negative for dizziness, weakness, numbness and headaches.     Physical Exam Updated Vital Signs BP 138/80 (BP Location: Left Arm)   Pulse 91   Temp 99 F (37.2 C) (Oral) Comment: Pt refused rectal temp  Resp 19   SpO2 95%   Physical Exam  Constitutional: She is oriented to person, place, and time. She appears well-developed and well-nourished.  HENT:  Head: Normocephalic and atraumatic.  Eyes: Pupils are equal, round, and reactive to light.  Neck: Normal range of motion. Neck supple.  Cardiovascular: Normal rate, regular rhythm and normal heart sounds.   Pulmonary/Chest: Effort normal and breath sounds normal. No respiratory distress. She has no wheezes. She has no rales. She exhibits no tenderness.  Abdominal: Soft. Bowel sounds are normal. There is no tenderness. There is no rebound and no guarding.  Musculoskeletal: Normal range of motion. She exhibits no edema.  Lymphadenopathy:    She has no cervical adenopathy.  Neurological: She is alert and oriented to person, place, and time.  Motor 5/5 all extremities Sensation grossly intact to LT all extremities Finger to Nose intact, no pronator drift CN II-XII grossly intact    Skin: Skin is warm and dry. No rash noted.  Psychiatric: She has a normal mood and affect.     ED Treatments / Results  Labs (all labs ordered are listed, but only abnormal results are displayed) Labs Reviewed  COMPREHENSIVE METABOLIC PANEL - Abnormal; Notable for the following:       Result Value   Glucose, Bld 118 (*)    Creatinine, Ser 1.32 (*)    AST 12 (*)    ALT 9 (*)    Total Bilirubin 1.3 (*)    GFR calc non Af Amer 35 (*)    GFR calc Af Amer 41 (*)    All other components within normal limits  CBC - Abnormal; Notable for the following:    Hemoglobin 11.9 (*)    Platelets 146 (*)    All other components within normal limits  URINALYSIS, ROUTINE W REFLEX MICROSCOPIC - Abnormal; Notable for the following:    Leukocytes, UA SMALL (*)      Bacteria, UA MANY (*)    Squamous Epithelial / LPF 0-5 (*)    All other components within normal limits  CBG MONITORING, ED - Abnormal; Notable for the following:    Glucose-Capillary 113 (*)  All other components within normal limits  URINE CULTURE  CBG MONITORING, ED  I-STAT CG4 LACTIC ACID, ED    EKG  EKG Interpretation  Date/Time:  Friday April 30 2017 09:07:35 EDT Ventricular Rate:  91 PR Interval:    QRS Duration: 94 QT Interval:  383 QTC Calculation: 472 R Axis:   61 Text Interpretation:  Sinus rhythm Borderline prolonged PR interval Baseline wander in lead(s) I III aVL since last tracing no significant change Confirmed by Malvin Johns [phone]) on 04/30/2017 9:22:00 AM       Radiology Dg Chest 2 View  Result Date: 04/30/2017 CLINICAL DATA:  Altered mental status. Shortness of breath this morning EXAM: CHEST  2 VIEW COMPARISON:  04/09/2017 FINDINGS: Chronic cardiomegaly. Stable mediastinal contours. There is interstitial coarsening that is similar to priors. Patient has history of COPD. No Kerley lines, air bronchogram, effusion, or pneumothorax. Biapical pleural based thickening. IMPRESSION: No acute finding when compared to prior. Chronic cardiomegaly and interstitial opacities. Electronically Signed   By: Monte Fantasia M.D.   On: 04/30/2017 09:41   Ct Head Wo Contrast  Result Date: 04/30/2017 CLINICAL DATA:  Altered mental status. History of laryngeal cancer and lymphoma. Last treatment 5 years ago. EXAM: CT HEAD WITHOUT CONTRAST TECHNIQUE: Contiguous axial images were obtained from the base of the skull through the vertex without intravenous contrast. COMPARISON:  08/05/2014 FINDINGS: Brain: Ventricles and cisterns are within normal. There is mild age related atrophic change. There is moderate chronic ischemic microvascular disease. Small old lacune infarct over the left periventricular white matter. No mass, mass effect, shift of midline structures or acute  hemorrhage. No evidence of acute infarction. Vascular: No hyperdense vessel or unexpected calcification. Skull: Normal. Negative for fracture or focal lesion. Sinuses/Orbits: No acute finding. Other: None. IMPRESSION: No acute intracranial findings. Chronic ischemic microvascular disease and age related atrophic change. Small old lacunar infarct in the left periventricular white matter. Electronically Signed   By: Marin Olp M.D.   On: 04/30/2017 10:13    Procedures Procedures (including critical care time)  Medications Ordered in ED Medications  cephALEXin (KEFLEX) capsule 500 mg (not administered)     Initial Impression / Assessment and Plan / ED Course  I have reviewed the triage vital signs and the nursing notes.  Pertinent labs & imaging results that were available during my care of the patient were reviewed by me and considered in my medical decision making (see chart for details).     Patient presents after an episode of decreased responsiveness.  It is suspected that she may have had a seizure. She did not fall. There is no injuries. She is currently back to baseline. There is no evidence of arrhythmias. She doesn't report any dizziness chest pain or other presyncopal type of events. She has had some decreased energy over the last week or so. She does have some evidence of a urinary tract infection. She has no suggestions of sepsis. She otherwise is currently back to baseline. Her family states that she's back to her normal self. She is alert and oriented 4. She was discharged home in good condition. Urine culture was sent. I will start her on Keflex. She reports a penicillin allergy but only reports a rash and no history of anaphylaxis. Her family states that she has a follow-up appointment with her neurologist this upcoming week. She had reportedly an episode of facial drooping lasted about 90 seconds 4 days ago. I encouraged the family to speak with her neurologist  regarding this.  She doesn't have any current strokelike symptoms or other recent strokelike symptoms. I don't feel this point she needs to be admitted for further evaluation. She does have some minor lab abnormalities such as a mildly elevated creatinine and mild thrombocytopenia. I encouraged the family to have a follow-up appointment with her primary care physician follow-up on these issues.  Return precautions were given.  Final Clinical Impressions(s) / ED Diagnoses   Final diagnoses:  Unresponsive episode  Urinary tract infection without hematuria, site unspecified  Elevated serum creatinine    New Prescriptions New Prescriptions   CEPHALEXIN (KEFLEX) 500 MG CAPSULE    Take 1 capsule (500 mg total) by mouth 2 (two) times daily.     Malvin Johns, MD 04/30/17 [phone]

## 2017-04-30 NOTE — Telephone Encounter (Signed)
Spoke with Percell Miller, Pt was not admitted, believes something was said about dehydration and maybe UTI? He said Pt had some sort of scan whil at [facility] today. He would feel much beeter if you could see her next week, he feels like she was acting like she did when she had a stroke.

## 2017-04-30 NOTE — ED Triage Notes (Signed)
Per EMS, Pt, from home, presents w/ AMS.  Family reports she was found slumped in walker chair.  Pt was lethargic and confused.  Denies pain.  Denies new n/v/d.  EMS reports orthostatic changes.  Hx of COPD and recurrent UTIs.    Family reports brief R side facial droop x 3 days ago.

## 2017-05-01 ENCOUNTER — Emergency Department (HOSPITAL_COMMUNITY): Payer: Medicare Other

## 2017-05-01 ENCOUNTER — Encounter (HOSPITAL_COMMUNITY): Payer: Self-pay | Admitting: Emergency Medicine

## 2017-05-01 ENCOUNTER — Inpatient Hospital Stay (HOSPITAL_COMMUNITY)
Admission: EM | Admit: 2017-05-01 | Discharge: 2017-05-03 | DRG: 690 | Disposition: A | Discharge status: Home / Self Care | Payer: Medicare Other | Attending: Family Medicine | Admitting: Family Medicine

## 2017-05-01 DIAGNOSIS — Z8349 Family history of other endocrine, nutritional and metabolic diseases: Secondary | ICD-10-CM

## 2017-05-01 DIAGNOSIS — N183 Chronic kidney disease, stage 3 unspecified: Secondary | ICD-10-CM | POA: Diagnosis present

## 2017-05-01 DIAGNOSIS — N1831 Chronic kidney disease, stage 3a: Secondary | ICD-10-CM | POA: Diagnosis present

## 2017-05-01 DIAGNOSIS — N179 Acute kidney failure, unspecified: Secondary | ICD-10-CM | POA: Diagnosis present

## 2017-05-01 DIAGNOSIS — Z9842 Cataract extraction status, left eye: Secondary | ICD-10-CM

## 2017-05-01 DIAGNOSIS — Z8711 Personal history of peptic ulcer disease: Secondary | ICD-10-CM

## 2017-05-01 DIAGNOSIS — Z8521 Personal history of malignant neoplasm of larynx: Secondary | ICD-10-CM

## 2017-05-01 DIAGNOSIS — Z96653 Presence of artificial knee joint, bilateral: Secondary | ICD-10-CM | POA: Diagnosis present

## 2017-05-01 DIAGNOSIS — Z87891 Personal history of nicotine dependence: Secondary | ICD-10-CM

## 2017-05-01 DIAGNOSIS — Z8572 Personal history of non-Hodgkin lymphomas: Secondary | ICD-10-CM

## 2017-05-01 DIAGNOSIS — R531 Weakness: Secondary | ICD-10-CM

## 2017-05-01 DIAGNOSIS — I700 Atherosclerosis of aorta: Secondary | ICD-10-CM | POA: Diagnosis present

## 2017-05-01 DIAGNOSIS — Z800 Family history of malignant neoplasm of digestive organs: Secondary | ICD-10-CM

## 2017-05-01 DIAGNOSIS — M19042 Primary osteoarthritis, left hand: Secondary | ICD-10-CM | POA: Diagnosis present

## 2017-05-01 DIAGNOSIS — I4891 Unspecified atrial fibrillation: Secondary | ICD-10-CM | POA: Diagnosis present

## 2017-05-01 DIAGNOSIS — N390 Urinary tract infection, site not specified: Secondary | ICD-10-CM | POA: Diagnosis present

## 2017-05-01 DIAGNOSIS — Z9841 Cataract extraction status, right eye: Secondary | ICD-10-CM

## 2017-05-01 DIAGNOSIS — K219 Gastro-esophageal reflux disease without esophagitis: Secondary | ICD-10-CM | POA: Diagnosis present

## 2017-05-01 DIAGNOSIS — Z888 Allergy status to other drugs, medicaments and biological substances status: Secondary | ICD-10-CM

## 2017-05-01 DIAGNOSIS — I2510 Atherosclerotic heart disease of native coronary artery without angina pectoris: Secondary | ICD-10-CM | POA: Diagnosis present

## 2017-05-01 DIAGNOSIS — R569 Unspecified convulsions: Secondary | ICD-10-CM | POA: Diagnosis not present

## 2017-05-01 DIAGNOSIS — E860 Dehydration: Secondary | ICD-10-CM | POA: Diagnosis present

## 2017-05-01 DIAGNOSIS — I480 Paroxysmal atrial fibrillation: Secondary | ICD-10-CM | POA: Diagnosis not present

## 2017-05-01 DIAGNOSIS — N3000 Acute cystitis without hematuria: Secondary | ICD-10-CM

## 2017-05-01 DIAGNOSIS — Z881 Allergy status to other antibiotic agents status: Secondary | ICD-10-CM

## 2017-05-01 DIAGNOSIS — Z7901 Long term (current) use of anticoagulants: Secondary | ICD-10-CM

## 2017-05-01 DIAGNOSIS — I10 Essential (primary) hypertension: Secondary | ICD-10-CM | POA: Diagnosis not present

## 2017-05-01 DIAGNOSIS — B961 Klebsiella pneumoniae [K. pneumoniae] as the cause of diseases classified elsewhere: Secondary | ICD-10-CM | POA: Diagnosis present

## 2017-05-01 DIAGNOSIS — M19041 Primary osteoarthritis, right hand: Secondary | ICD-10-CM | POA: Diagnosis present

## 2017-05-01 DIAGNOSIS — G40909 Epilepsy, unspecified, not intractable, without status epilepticus: Secondary | ICD-10-CM | POA: Diagnosis present

## 2017-05-01 DIAGNOSIS — J449 Chronic obstructive pulmonary disease, unspecified: Secondary | ICD-10-CM | POA: Diagnosis present

## 2017-05-01 DIAGNOSIS — Z885 Allergy status to narcotic agent status: Secondary | ICD-10-CM

## 2017-05-01 DIAGNOSIS — I739 Peripheral vascular disease, unspecified: Secondary | ICD-10-CM | POA: Diagnosis present

## 2017-05-01 DIAGNOSIS — Z823 Family history of stroke: Secondary | ICD-10-CM

## 2017-05-01 DIAGNOSIS — Z880 Allergy status to penicillin: Secondary | ICD-10-CM

## 2017-05-01 DIAGNOSIS — E785 Hyperlipidemia, unspecified: Secondary | ICD-10-CM | POA: Diagnosis present

## 2017-05-01 DIAGNOSIS — I129 Hypertensive chronic kidney disease with stage 1 through stage 4 chronic kidney disease, or unspecified chronic kidney disease: Secondary | ICD-10-CM | POA: Diagnosis present

## 2017-05-01 DIAGNOSIS — R4182 Altered mental status, unspecified: Secondary | ICD-10-CM

## 2017-05-01 LAB — COMPREHENSIVE METABOLIC PANEL
ALK PHOS: 58 U/L (ref 38–126)
ALT: 9 U/L — ABNORMAL LOW (ref 14–54)
AST: 14 U/L — ABNORMAL LOW (ref 15–41)
Albumin: 3.7 g/dL (ref 3.5–5.0)
Anion gap: 8 (ref 5–15)
BILIRUBIN TOTAL: 1.4 mg/dL — AB (ref 0.3–1.2)
BUN: 15 mg/dL (ref 6–20)
CALCIUM: 9.1 mg/dL (ref 8.9–10.3)
CO2: 25 mmol/L (ref 22–32)
Chloride: 103 mmol/L (ref 101–111)
Creatinine, Ser: 1.43 mg/dL — ABNORMAL HIGH (ref 0.44–1.00)
GFR calc Af Amer: 37 mL/min — ABNORMAL LOW (ref >60)
GFR calc non Af Amer: 32 mL/min — ABNORMAL LOW (ref >60)
GLUCOSE: 119 mg/dL — AB (ref 65–99)
POTASSIUM: 3.7 mmol/L (ref 3.5–5.1)
Sodium: 136 mmol/L (ref 135–145)
TOTAL PROTEIN: 6.6 g/dL (ref 6.5–8.1)

## 2017-05-01 LAB — CBC
HEMATOCRIT: 37.5 % (ref 36.0–46.0)
Hemoglobin: 11.8 g/dL — ABNORMAL LOW (ref 12.0–15.0)
MCH: 27.5 pg (ref 26.0–34.0)
MCHC: 31.5 g/dL (ref 30.0–36.0)
MCV: 87.4 fL (ref 78.0–100.0)
PLATELETS: 132 10*3/uL — AB (ref 150–400)
RBC: 4.29 MIL/uL (ref 3.87–5.11)
RDW: 15.1 % (ref 11.5–15.5)
WBC: 6 10*3/uL (ref 4.0–10.5)

## 2017-05-01 LAB — I-STAT CG4 LACTIC ACID, ED: Lactic Acid, Venous: 0.91 mmol/L (ref 0.5–1.9)

## 2017-05-01 MED ORDER — SODIUM CHLORIDE 0.9 % IV BOLUS (SEPSIS)
1000.0000 mL | Freq: Once | INTRAVENOUS | Status: AC
  Administered 2017-05-01: 1000 mL via INTRAVENOUS

## 2017-05-01 MED ORDER — LEVETIRACETAM 500 MG/5ML IV SOLN
1000.0000 mg | Freq: Once | INTRAVENOUS | Status: AC
  Administered 2017-05-01: 1000 mg via INTRAVENOUS
  Filled 2017-05-01: qty 10

## 2017-05-01 MED ORDER — HALOPERIDOL LACTATE 5 MG/ML IJ SOLN
2.0000 mg | Freq: Four times a day (QID) | INTRAMUSCULAR | Status: DC | PRN
  Administered 2017-05-03: 2 mg via INTRAVENOUS
  Filled 2017-05-01: qty 1

## 2017-05-01 MED ORDER — DEXTROSE 5 % IV SOLN
1.0000 g | Freq: Once | INTRAVENOUS | Status: AC
  Administered 2017-05-01: 1 g via INTRAVENOUS
  Filled 2017-05-01: qty 10

## 2017-05-01 NOTE — ED Notes (Signed)
Pt sheets are soiled, this rn, Herbalist and [facility] EDT attempted to change sheets and clean patient up, pt refused after multiple attempts.

## 2017-05-01 NOTE — ED Notes (Signed)
Patient transported to X-ray 

## 2017-05-01 NOTE — ED Notes (Signed)
MD at bedside. 

## 2017-05-01 NOTE — ED Notes (Signed)
Got patient undress on the monitor patient is resting with family at bedside call bell in reach

## 2017-05-01 NOTE — ED Triage Notes (Signed)
Pt in from home via [facility] with AMS since 0800 this morning. Per EMS, pt was in chair and suddenly had "fixed stare and was nonverbal x 5 min". Hx of CVA, seizures, was seen yesterday at [facility] and dx with UTI. Started abx yesterday. Takes 500 mg Keppra daily, CBG 136. Pt baseline a&ox2, confused and occasionally speaking now, VSS. MAE's equally, face equal, speech clear

## 2017-05-01 NOTE — ED Notes (Signed)
Loma Sousa RN spoke with hospitalist about pts family request to be updated on test results. MD stated they would come see patient

## 2017-05-01 NOTE — ED Notes (Signed)
ED Provider at bedside. 

## 2017-05-01 NOTE — ED Notes (Signed)
[name] pts daughter wanted to leave a contact number in case she was needed or when pt gets a bed [phone]

## 2017-05-01 NOTE — H&P (Signed)
History and Physical    [name] ATF:[id number] DOB: November 01, 1927 DOA: 05/01/2017  PCP: Marin Olp, MD  Patient coming from: home  I have personally briefly reviewed patient's old medical records in [place]  Chief Complaint: weakness  HPI: [name] is a 83 y.o. female with medical history significant of seizure disorder, atrial fibrillation, COPD, GERD. Patient's family reports that she's had generalized weakness, which is progressively worsening over the past several weeks. By mouth intake has remained poor. Over the past 2-3 days, she's had a rapid decline. Family is unsure whether she has been febrile. She did suffer a seizure yesterday and was brought to the emergency room for evaluation. It was noted that she may have recurrent tract infection, she received a dose of Rocephin and was discharged home with Keflex. She is brought back to the emergency room today after suffering another seizure. She was noted to be postictal for approximately 2 hours. She's not had any nausea, vomiting, diarrhea, cough. She does not complain of dysuria.  ED Course: On arrival to the emergency room, she is noted to be mildly febrile with temperature 100.3. Vitals are otherwise stable. She's had a bump in creatinine to 1.4. Today's account is stable. Lactic acid is normal. Chest x-ray does not show any evidence of pneumonia. Urine culture that was drawn yesterday is positive for gram-negative rods. Since she had another seizure this morning, she was loaded with Keppra in the emergency room. She's been referred for admission.  Review of Systems: As per HPI otherwise 10 point review of systems negative.    Past Medical History:  Diagnosis Date  . ABDOMINAL INCISIONAL HERNIA 04/03/2010      . Anxiety   . Arthritis    "lots; hands" (05/26/2013)  . Blood transfusion    "w/1st miscarriage" (05/26/2013)  . COPD (chronic obstructive pulmonary disease) ([place])   . Coronary artery  disease   . Depression   . Diverticulitis of colon with hemorrhage 07/24/2008  . DVT (deep venous thrombosis) ([place]) 1954   "after I had my first child" (05/26/2013)  . Exertional shortness of breath   . Family history of anesthesia complication    "sisters also get sick as a dog" (05/26/2013)  . GERD 05/03/2007      . GERD (gastroesophageal reflux disease)   . H/O hiatal hernia   . HCAP (healthcare-associated pneumonia) 06/26/2013  . Heart murmur    "when I was a child" (05/26/2013)  . History of TIAs   . Hyperlipidemia   . Hypertension   . Laryngeal carcinoma ([place]) hx of ca   remotely with resection and xrt/chronic hoarsemenss  . Lymphona, mantle cell, inguinal region/lower limb ([place])    "large c-cell" (05/26/2013)  . OSTEOARTHRITIS 05/03/2007  . PEPTIC ULCER DISEASE 07/06/2008  . Pneumonia 2013   "once" (05/26/2013)  . PUD (peptic ulcer disease)   . PVD (peripheral vascular disease) ([place])   . Seizures ([place])   . Stroke ([place]) 05/26/2013   "very mild; affected my speech for a while" (05/26/2013)  . VARICOSE VEINS LOWER EXTREMITIES W/INFLAMMATION 01/15/2009    Past Surgical History:  Procedure Laterality Date  . ABDOMINAL EXPLORATION SURGERY  12/01/2012  . APPENDECTOMY  09/13/2008  . CAROTID ENDARTERECTOMY Bilateral 2004   2004 a month apart right and left  . CARPAL TUNNEL RELEASE Left ?1960's  . CATARACT EXTRACTION  2005   bilateral  . COLON SURGERY  09/13/2008   Laparoscopic assisted converted to open sigmoid colectomy.Archie Endo  09/13/2008 (05/26/2013)  . DILATION AND CURETTAGE OF UTERUS  1950's   "had 2 for miscarriages" (05/26/2013)  . DIVERTING ILEOSTOMY  09/22/2008   iliostomy for diverticulitis/notes 09/22/2008 (05/26/2013)  . HERNIA REPAIR  05/2010   ventral hernia repair  . ILEOSTOMY CLOSURE  01/2010  . RADICAL NECK DISSECTION  1986   "larynx cancer" (05/26/2013)  . TONSILLECTOMY AND ADENOIDECTOMY  1936   "tonsils grew back; no 2nd OR" (05/26/2013)  . TOTAL KNEE  ARTHROPLASTY Bilateral    2002 left 2008 right  . TUBAL LIGATION  1972     reports that she quit smoking about 32 years ago. Her smoking use included Cigarettes. She has a 92.50 pack-year smoking history. She has never used smokeless tobacco. She reports that she does not drink alcohol or use drugs.  Allergies  Allergen Reactions  . Ativan [Lorazepam] Other (See Comments)    Severe hallucinations   . Propoxyphene Hcl   . Codeine Itching  . Penicillins Rash  . Vancomycin Rash    Family History  Problem Relation Age of Onset  . Colon cancer Sister        colon  . Hyperlipidemia Mother   . Stroke Mother   . Cancer Father        mesothelioma     Prior to Admission medications   Medication Sig Start Date End Date Taking? Authorizing Provider  acetaminophen (TYLENOL) 650 MG CR tablet Take 650 mg by mouth every 8 (eight) hours as needed for pain. Reported on 12/09/2015   Yes [provider]  atorvastatin (LIPITOR) 20 MG tablet TAKE 1 TABLET BY MOUTH ONCE DAILY Patient taking differently: TAKE 20mg  BY MOUTH ONCE DAILY 04/06/17  Yes Marin Olp, MD  bisoprolol (ZEBETA) 5 MG tablet Take 1 tablet (5 mg total) by mouth daily. 04/28/16  Yes Marin Olp, MD  cephALEXin (KEFLEX) 500 MG capsule Take 1 capsule (500 mg total) by mouth 2 (two) times daily. 04/30/17  Yes Malvin Johns, MD  cholecalciferol (VITAMIN D) 1000 UNITS tablet Take 1,000 Units by mouth daily.   Yes [provider]  Cyanocobalamin (VITAMIN B-12 PO) Take 1 tablet by mouth daily.   Yes [provider]  famotidine (PEPCID) 20 MG tablet Take 20 mg by mouth at bedtime.   Yes [provider]  furosemide (LASIX) 20 MG tablet Take 1 tablet (20 mg total) by mouth daily as needed for edema. 04/28/16  Yes Marin Olp, MD  gabapentin (NEURONTIN) 100 MG capsule TAKE ONE CAPSULE BY MOUTH THREE TIMES DAILY Patient taking differently: TAKE 100mg  BY MOUTH IN THE EVENINIG 10/19/16  Yes Jaffe,  Adam R, DO  levETIRAcetam (KEPPRA) 500 MG tablet TAKE 1 TABLET BY MOUTH TWICE DAILY Patient taking differently: TAKE 500mg  BY MOUTH TWICE DAILY 01/18/17  Yes Tomi Likens, Adam R, DO  loperamide (IMODIUM) 2 MG capsule Take 2 mg by mouth daily as needed for diarrhea or loose stools.    Yes [provider]  Tiotropium Bromide-Olodaterol (STIOLTO RESPIMAT) 2.5-2.5 MCG/ACT AERS Inhale 2 puffs into the lungs daily. 03/22/17  Yes Tanda Rockers, MD  VENTOLIN HFA 108 (90 Base) MCG/ACT inhaler Inhale 2 puffs into the lungs every 6 (six) hours as needed for wheezing or shortness of breath.  12/31/16  Yes [provider]  XARELTO 15 MG TABS tablet TAKE ONE TABLET BY MOUTH ONCE DAILY WITH  SUPPER Patient taking differently: TAKE 15mg  BY MOUTH ONCE DAILY WITH  SUPPER 10/14/16  Yes Marin Olp, MD  Physical Exam: Vitals:   05/01/17 1015 05/01/17 1030 05/01/17 1045 05/01/17 1100  BP: (!) 145/78 132/77 115/71 118/65  Pulse: 88 89    Resp:      Temp:      TempSrc:      SpO2:   93%   Weight:      Height:        Constitutional: NAD, calm, comfortable Vitals:   05/01/17 1015 05/01/17 1030 05/01/17 1045 05/01/17 1100  BP: (!) 145/78 132/77 115/71 118/65  Pulse: 88 89    Resp:      Temp:      TempSrc:      SpO2:   93%   Weight:      Height:       Eyes: PERRL, lids and conjunctivae normal ENMT: Mucous membranes are moist. Posterior pharynx clear of any exudate or lesions.Normal dentition.  Neck: normal, supple, no masses, no thyromegaly Respiratory: clear to auscultation bilaterally, no wheezing, no crackles. Normal respiratory effort. No accessory muscle use.  Cardiovascular: Regular rate and rhythm, no murmurs / rubs / gallops. No extremity edema. 2+ pedal pulses. No carotid bruits.  Abdomen: no tenderness, no masses palpated. No hepatosplenomegaly. Bowel sounds positive.  Musculoskeletal: no clubbing / cyanosis. No joint deformity upper and lower extremities. Good ROM, no  contractures. Normal muscle tone.  Skin: no rashes, lesions, ulcers. No induration Neurologic: CN 2-12 grossly intact. Sensation intact, DTR normal. Strength 5/5 in all 4.  Psychiatric: mild confusion, pleasant, able to follow commands   Labs on Admission: I have personally reviewed following labs and imaging studies  CBC:  Recent Labs Lab 04/30/17 0950 05/01/17 0915  WBC 6.3 6.0  HGB 11.9* 11.8*  HCT 37.1 37.5  MCV 86.3 87.4  PLT 146* 151*   Basic Metabolic Panel:  Recent Labs Lab 04/30/17 0950 05/01/17 0915  NA 141 136  K 3.7 3.7  CL 106 103  CO2 27 25  GLUCOSE 118* 119*  BUN 14 15  CREATININE 1.32* 1.43*  CALCIUM 8.9 9.1   GFR: Estimated Creatinine Clearance: 28.3 mL/min (A) (by C-G formula based on SCr of 1.43 mg/dL (H)). Liver Function Tests:  Recent Labs Lab 04/30/17 0950 05/01/17 0915  AST 12* 14*  ALT 9* 9*  ALKPHOS 71 58  BILITOT 1.3* 1.4*  PROT 7.3 6.6  ALBUMIN 3.7 3.7   No results for input(s): LIPASE, AMYLASE in the last 168 hours. No results for input(s): AMMONIA in the last 168 hours. Coagulation Profile: No results for input(s): INR, PROTIME in the last 168 hours. Cardiac Enzymes: No results for input(s): CKTOTAL, CKMB, CKMBINDEX, TROPONINI in the last 168 hours. BNP (last 3 results)  Recent Labs  03/22/17 1149  PROBNP 243.0*   HbA1C: No results for input(s): HGBA1C in the last 72 hours. CBG:  Recent Labs Lab 04/30/17 0950  GLUCAP 113*   Lipid Profile: No results for input(s): CHOL, HDL, LDLCALC, TRIG, CHOLHDL, LDLDIRECT in the last 72 hours. Thyroid Function Tests: No results for input(s): TSH, T4TOTAL, FREET4, T3FREE, THYROIDAB in the last 72 hours. Anemia Panel: No results for input(s): VITAMINB12, FOLATE, FERRITIN, TIBC, IRON, RETICCTPCT in the last 72 hours. Urine analysis:    Component Value Date/Time   COLORURINE YELLOW 04/30/2017 1000   APPEARANCEUR CLEAR 04/30/2017 1000   LABSPEC 1.011 04/30/2017 1000   PHURINE  6.0 04/30/2017 1000   GLUCOSEU NEGATIVE 04/30/2017 1000   HGBUR NEGATIVE 04/30/2017 1000   HGBUR trace-lysed 02/13/2010 0830   BILIRUBINUR NEGATIVE 04/30/2017 1000  BILIRUBINUR n 10/08/2016 1554   KETONESUR NEGATIVE 04/30/2017 1000   PROTEINUR NEGATIVE 04/30/2017 1000   UROBILINOGEN 0.2 10/08/2016 1554   UROBILINOGEN 0.2 08/05/2014 1715   NITRITE NEGATIVE 04/30/2017 1000   LEUKOCYTESUR SMALL (A) 04/30/2017 1000    Radiological Exams on Admission: Dg Chest 2 View  Result Date: 05/01/2017 CLINICAL DATA:  Shortness of breath.  Cough and weakness EXAM: CHEST  2 VIEW COMPARISON:  Yesterday FINDINGS: Asymmetric retrocardiac density in the frontal projection is not visualized laterally and likely summation shadows and mild atelectasis. No edema, effusion, or suspected consolidation. Normal heart size. Mild aortic tortuosity. IMPRESSION: Low volume chest without acute finding when compared to prior. If persistent or progressive respiratory symptoms, a short follow-up is suggested to re-evaluate the left base. Electronically Signed   By: Monte Fantasia M.D.   On: 05/01/2017 11:01   Dg Chest 2 View  Result Date: 04/30/2017 CLINICAL DATA:  Altered mental status. Shortness of breath this morning EXAM: CHEST  2 VIEW COMPARISON:  04/09/2017 FINDINGS: Chronic cardiomegaly. Stable mediastinal contours. There is interstitial coarsening that is similar to priors. Patient has history of COPD. No Kerley lines, air bronchogram, effusion, or pneumothorax. Biapical pleural based thickening. IMPRESSION: No acute finding when compared to prior. Chronic cardiomegaly and interstitial opacities. Electronically Signed   By: Monte Fantasia M.D.   On: 04/30/2017 09:41   Ct Head Wo Contrast  Result Date: 05/01/2017 CLINICAL DATA:  Altered mental status.  History of CVA and seizures. EXAM: CT HEAD WITHOUT CONTRAST TECHNIQUE: Contiguous axial images were obtained from the base of the skull through the vertex without  intravenous contrast. COMPARISON:  CT head 04/30/2017 and 08/05/2014. FINDINGS: Brain: There is no evidence of acute intracranial hemorrhage, mass lesion, brain edema or extra-axial fluid collection. The ventricles and subarachnoid spaces are appropriately sized for age. There is no CT evidence of acute cortical infarction. Chronic small vessel ischemic changes are again noted within the periventricular and subcortical white matter with asymmetric involvement of the left basal ganglia. Vascular: Intracranial vascular calcifications. No hyperdense vessel identified. Skull: Negative for fracture or focal lesion. Sinuses/Orbits: Cerumen noted in both external auditory canals. The visualized paranasal sinuses and mastoid air cells are clear. No orbital abnormalities are seen. There are postsurgical changes in both globes. Other: None. IMPRESSION: No acute intracranial findings. Stable chronic small vessel ischemic changes. Electronically Signed   By: Richardean Sale M.D.   On: 05/01/2017 10:35   Ct Head Wo Contrast  Result Date: 04/30/2017 CLINICAL DATA:  Altered mental status. History of laryngeal cancer and lymphoma. Last treatment 5 years ago. EXAM: CT HEAD WITHOUT CONTRAST TECHNIQUE: Contiguous axial images were obtained from the base of the skull through the vertex without intravenous contrast. COMPARISON:  08/05/2014 FINDINGS: Brain: Ventricles and cisterns are within normal. There is mild age related atrophic change. There is moderate chronic ischemic microvascular disease. Small old lacune infarct over the left periventricular white matter. No mass, mass effect, shift of midline structures or acute hemorrhage. No evidence of acute infarction. Vascular: No hyperdense vessel or unexpected calcification. Skull: Normal. Negative for fracture or focal lesion. Sinuses/Orbits: No acute finding. Other: None. IMPRESSION: No acute intracranial findings. Chronic ischemic microvascular disease and age related atrophic  change. Small old lacunar infarct in the left periventricular white matter. Electronically Signed   By: Marin Olp M.D.   On: 04/30/2017 10:13    EKG: Independently reviewed. Sinus rhythm without acute changes  Assessment/Plan Active Problems:   Essential hypertension  Atrial fibrillation ([place])   AKI (acute kidney injury) ([place])   Weakness generalized   CKD (chronic kidney disease), stage III   UTI (urinary tract infection)   Seizure ([place])     1. Urinary tract infection. Follow-up urine culture, currently growing gram-negative rods. Continue on Rocephin. 2. Seizure disorder. Patient has had 2 breakthrough seizures yesterday and today. She's not been started on any new precipitating medications. Case was discussed with Dr. Leonel Ramsay on call for neurology who recommended increasing her Keppra dose to 1000 mg twice a day . if she continues to have breakthrough seizure, could consider formal neurology consultation. I suspect that her urinary tract infection has lowered her seizure threshold. 3. Acute kidney injury and chronic kidney disease stage III. She has had a mild bump in creatinine related to dehydration. Continue IV fluids and recheck labs in a.m . 4. Atrial fibrillation. Currently in sinus rhythm. Continue on bisoprolol. She is anticoagulated with xarelto. 5. Hypertension. Continue bisoprolol. Pressures currently stable 6. Generalized weakness. We'll get physical therapy evaluation.  DVT prophylaxis: xarelto Code Status: full code Family Communication: discussed with daughter over the phone Disposition Plan: discharge home once improved Consults called:  Admission status: observation/telemetry   MEMON,JEHANZEB MD [facility] Pager [phone]  If 7PM-7AM, please contact night-coverage [email or web address] Password [email or web address]  05/01/2017, 1:38 PM

## 2017-05-01 NOTE — ED Notes (Signed)
Family at bedside, attempted to convince patient to allow staff to change her soiled linen, pt continues to refuse.

## 2017-05-01 NOTE — ED Provider Notes (Signed)
[facility] DEPT Provider Note   CSN: [id number] Arrival date & time: 05/01/17  0272     History   Chief Complaint Chief Complaint  Patient presents with  . Altered Mental Status    HPI [name] is a 83 y.o. female.  HPI Patient is an 83 year old female who is brought to the emergency department by EMS after found to be increasingly confused and altered per family.  The patient suddenly had a fixed stare was nonverbal for about 5 minutes.  Similar episode happened yesterday.  She does have a history of seizures.  She is on 500 mg of Keppra twice a day.  She is compliant with her medications but did not take her This morning.  Family is concerned because of 3-4 weeks of increasing generalized weakness and fatigue.  Decreased exercise tolerance at home with many days spent just lying in bed.  Questionable fever and chills at home.  Patient denies weakness of the arms or legs.  No recent changes to her medications.  Patient was seen at the [facility] long emergency department yesterday and worked up and was found to have a urinary tract infection.  Urine culture growing greater than 100,000 gram-negative rods thus far.  Urine sensitivities are not back.  Some cough over the past several weeks without significant change.  No recent injury or trauma.   Past Medical History:  Diagnosis Date  . ABDOMINAL INCISIONAL HERNIA 04/03/2010      . Anxiety   . Arthritis    "lots; hands" (05/26/2013)  . Blood transfusion    "w/1st miscarriage" (05/26/2013)  . COPD (chronic obstructive pulmonary disease) ([place])   . Coronary artery disease   . Depression   . Diverticulitis of colon with hemorrhage 07/24/2008  . DVT (deep venous thrombosis) ([place]) 1954   "after I had my first child" (05/26/2013)  . Exertional shortness of breath   . Family history of anesthesia complication    "sisters also get sick as a dog" (05/26/2013)  . GERD 05/03/2007      . GERD (gastroesophageal reflux disease)   .  H/O hiatal hernia   . HCAP (healthcare-associated pneumonia) 06/26/2013  . Heart murmur    "when I was a child" (05/26/2013)  . History of TIAs   . Hyperlipidemia   . Hypertension   . 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 Lymphona, mantle cell, inguinal region/lower limb ([place])    "large c-cell" (05/26/2013)  . OSTEOARTHRITIS 05/03/2007  . PEPTIC ULCER DISEASE 07/06/2008  . Pneumonia 2013   "once" (05/26/2013)  . PUD (peptic ulcer disease)   . PVD (peripheral vascular disease) ([place])   . Seizures ([place])   . Stroke ([place]) 05/26/2013   "very mild; affected my speech for a while" (05/26/2013)  . VARICOSE VEINS LOWER EXTREMITIES W/INFLAMMATION 01/15/2009    Patient Active Problem List   Diagnosis Date Noted  . Chronic respiratory failure with hypoxia and hypercapnia ([place]) 03/22/2017  . Aortic atherosclerosis ([place]) 06/30/2016  . Diastolic CHF ([place]) 53/66/4403  . Bacteremia due to Streptococcus 11/21/2015  . Cellulitis of leg, right 11/20/2015  . History of cardioembolic stroke 47/42/5956  . Diverticulitis of colon 09/26/2014  . CKD (chronic kidney disease), stage III 09/26/2014  . Laryngeal carcinoma ([place])   . Spinal stenosis, lumbar region, with neurogenic claudication 05/09/2014  . Weakness generalized 06/30/2013  . History of Focal seizure ([place]) 06/16/2013  . TIA (transient ischemic attack) 05/31/2013  .  Bilateral hand pain 01/09/2013  . History of Large B-cell lymphoma ([place]) 02/18/2012  . Atrial fibrillation ([place]) 12/14/2008  . Anxiety state 10/20/2008  . Depression 10/20/2008  . Dyspnea on exertion 07/06/2008  . COPD GOLD II 03/26/2008  . Hyperlipidemia 03/18/2007  . Essential hypertension 03/18/2007    Past Surgical History:  Procedure Laterality Date  . ABDOMINAL EXPLORATION SURGERY  12/01/2012  . APPENDECTOMY  09/13/2008  . CAROTID ENDARTERECTOMY Bilateral 2004   2004 a month apart right and left  . CARPAL TUNNEL  RELEASE Left ?1960's  . CATARACT EXTRACTION  2005   bilateral  . COLON SURGERY  09/13/2008   Laparoscopic assisted converted to open sigmoid colectomy.Archie Endo 09/13/2008 (05/26/2013)  . DILATION AND CURETTAGE OF UTERUS  1950's   "had 2 for miscarriages" (05/26/2013)  . DIVERTING ILEOSTOMY  09/22/2008   iliostomy for diverticulitis/notes 09/22/2008 (05/26/2013)  . HERNIA REPAIR  05/2010   ventral hernia repair  . ILEOSTOMY CLOSURE  01/2010  . RADICAL NECK DISSECTION  1986   "larynx cancer" (05/26/2013)  . TONSILLECTOMY AND ADENOIDECTOMY  1936   "tonsils grew back; no 2nd OR" (05/26/2013)  . TOTAL KNEE ARTHROPLASTY Bilateral    2002 left 2008 right  . TUBAL LIGATION  1972    OB History    No data available       Home Medications    Prior to Admission medications   Medication Sig Start Date End Date Taking? Authorizing Provider  acetaminophen (TYLENOL) 650 MG CR tablet Take 650 mg by mouth every 8 (eight) hours as needed for pain. Reported on 12/09/2015    [provider]  atorvastatin (LIPITOR) 20 MG tablet TAKE 1 TABLET BY MOUTH ONCE DAILY 04/06/17   Marin Olp, MD  bisoprolol (ZEBETA) 5 MG tablet Take 1 tablet (5 mg total) by mouth daily. 04/28/16   Marin Olp, MD  cephALEXin (KEFLEX) 500 MG capsule Take 1 capsule (500 mg total) by mouth 2 (two) times daily. 04/30/17   Malvin Johns, MD  cholecalciferol (VITAMIN D) 1000 UNITS tablet Take 1,000 Units by mouth daily.    [provider]  Cyanocobalamin (VITAMIN B-12 PO) Take 1 tablet by mouth daily.    [provider]  famotidine (PEPCID) 20 MG tablet Take 20 mg by mouth at bedtime.    [provider]  furosemide (LASIX) 20 MG tablet Take 1 tablet (20 mg total) by mouth daily as needed for edema. 04/28/16   Marin Olp, MD  gabapentin (NEURONTIN) 100 MG capsule TAKE ONE CAPSULE BY MOUTH THREE TIMES DAILY Patient taking differently: TAKE ONE CAPSULE BY MOUTH IN THE EVENINIG 10/19/16    Jaffe, Adam R, DO  levETIRAcetam (KEPPRA) 500 MG tablet TAKE 1 TABLET BY MOUTH TWICE DAILY 01/18/17   Pieter Partridge, DO  loperamide (IMODIUM) 2 MG capsule Take 2 mg by mouth daily as needed for diarrhea or loose stools.     [provider]  Tiotropium Bromide-Olodaterol (STIOLTO RESPIMAT) 2.5-2.5 MCG/ACT AERS Inhale 2 puffs into the lungs daily. 03/22/17   Tanda Rockers, MD  VENTOLIN HFA 108 (90 Base) MCG/ACT inhaler Inhale 2 puffs into the lungs every 6 (six) hours as needed for wheezing or shortness of breath.  12/31/16   [provider]  XARELTO 15 MG TABS tablet TAKE ONE TABLET BY MOUTH ONCE DAILY WITH  SUPPER 10/14/16   Marin Olp, MD    Family History Family History  Problem Relation Age of Onset  . Colon cancer  Sister        colon  . Hyperlipidemia Mother   . Stroke Mother   . Cancer Father        mesothelioma    Social History Social History  Substance Use Topics  . Smoking status: Former Smoker    Packs/day: 2.50    Years: 37.00    Types: Cigarettes    Quit date: 09/05/1984  . Smokeless tobacco: Never Used  . Alcohol use No     Allergies   Ativan [lorazepam]; Propoxyphene hcl; Codeine; Penicillins; and Vancomycin   Review of Systems Review of Systems  All other systems reviewed and are negative.    Physical Exam Updated Vital Signs BP 118/65   Pulse 89   Temp 100.3 F (37.9 C) (Oral)   Resp 20   Ht 5\' 2"  (1.575 m)   Wt 86.6 kg (191 lb)   SpO2 93%   BMI 34.93 kg/m   Physical Exam  Constitutional: She is oriented to person, place, and time. She appears well-developed and well-nourished. No distress.  HENT:  Head: Normocephalic and atraumatic.  Eyes: Pupils are equal, round, and reactive to light. EOM are normal.  Neck: Normal range of motion.  Cardiovascular: Normal rate, regular rhythm and normal heart sounds.   Pulmonary/Chest: Effort normal and breath sounds normal.  Abdominal: Soft. She exhibits no distension. There is no  tenderness.  Musculoskeletal: Normal range of motion.  Neurological: She is alert and oriented to person, place, and time.  5/5 strength in major muscle groups of  bilateral upper and lower extremities. Speech normal. No facial asymetry.   Skin: Skin is warm and dry.  Psychiatric: She has a normal mood and affect. Judgment normal.  Nursing note and vitals reviewed.    ED Treatments / Results  Labs (all labs ordered are listed, but only abnormal results are displayed) Labs Reviewed  CBC - Abnormal; Notable for the following:       Result Value   Hemoglobin 11.8 (*)    Platelets 132 (*)    All other components within normal limits  COMPREHENSIVE METABOLIC PANEL - Abnormal; Notable for the following:    Glucose, Bld 119 (*)    Creatinine, Ser 1.43 (*)    AST 14 (*)    ALT 9 (*)    Total Bilirubin 1.4 (*)    GFR calc non Af Amer 32 (*)    GFR calc Af Amer 37 (*)    All other components within normal limits  CULTURE, BLOOD (ROUTINE X 2)  CULTURE, BLOOD (ROUTINE X 2)  I-STAT CG4 LACTIC ACID, ED    EKG  EKG Interpretation  Date/Time:  Saturday May 01 2017 08:48:27 EDT Ventricular Rate:  88 PR Interval:    QRS Duration: 105 QT Interval:  372 QTC Calculation: 451 R Axis:   39 Text Interpretation:  Sinus rhythm No significant change was found Confirmed by Jola Schmidt [phone]) on 05/01/2017 10:19:43 AM       Radiology Dg Chest 2 View  Result Date: 05/01/2017 CLINICAL DATA:  Shortness of breath.  Cough and weakness EXAM: CHEST  2 VIEW COMPARISON:  Yesterday FINDINGS: Asymmetric retrocardiac density in the frontal projection is not visualized laterally and likely summation shadows and mild atelectasis. No edema, effusion, or suspected consolidation. Normal heart size. Mild aortic tortuosity. IMPRESSION: Low volume chest without acute finding when compared to prior. If persistent or progressive respiratory symptoms, a short follow-up is suggested to re-evaluate the left  base. Electronically Signed  By: Monte Fantasia M.D.   On: 05/01/2017 11:01   Dg Chest 2 View  Result Date: 04/30/2017 CLINICAL DATA:  Altered mental status. Shortness of breath this morning EXAM: CHEST  2 VIEW COMPARISON:  04/09/2017 FINDINGS: Chronic cardiomegaly. Stable mediastinal contours. There is interstitial coarsening that is similar to priors. Patient has history of COPD. No Kerley lines, air bronchogram, effusion, or pneumothorax. Biapical pleural based thickening. IMPRESSION: No acute finding when compared to prior. Chronic cardiomegaly and interstitial opacities. Electronically Signed   By: Monte Fantasia M.D.   On: 04/30/2017 09:41   Ct Head Wo Contrast  Result Date: 05/01/2017 CLINICAL DATA:  Altered mental status.  History of CVA and seizures. EXAM: CT HEAD WITHOUT CONTRAST TECHNIQUE: Contiguous axial images were obtained from the base of the skull through the vertex without intravenous contrast. COMPARISON:  CT head 04/30/2017 and 08/05/2014. FINDINGS: Brain: There is no evidence of acute intracranial hemorrhage, mass lesion, brain edema or extra-axial fluid collection. The ventricles and subarachnoid spaces are appropriately sized for age. There is no CT evidence of acute cortical infarction. Chronic small vessel ischemic changes are again noted within the periventricular and subcortical white matter with asymmetric involvement of the left basal ganglia. Vascular: Intracranial vascular calcifications. No hyperdense vessel identified. Skull: Negative for fracture or focal lesion. Sinuses/Orbits: Cerumen noted in both external auditory canals. The visualized paranasal sinuses and mastoid air cells are clear. No orbital abnormalities are seen. There are postsurgical changes in both globes. Other: None. IMPRESSION: No acute intracranial findings. Stable chronic small vessel ischemic changes. Electronically Signed   By: Richardean Sale M.D.   On: 05/01/2017 10:35   Ct Head Wo  Contrast  Result Date: 04/30/2017 CLINICAL DATA:  Altered mental status. History of laryngeal cancer and lymphoma. Last treatment 5 years ago. EXAM: CT HEAD WITHOUT CONTRAST TECHNIQUE: Contiguous axial images were obtained from the base of the skull through the vertex without intravenous contrast. COMPARISON:  08/05/2014 FINDINGS: Brain: Ventricles and cisterns are within normal. There is mild age related atrophic change. There is moderate chronic ischemic microvascular disease. Small old lacune infarct over the left periventricular white matter. No mass, mass effect, shift of midline structures or acute hemorrhage. No evidence of acute infarction. Vascular: No hyperdense vessel or unexpected calcification. Skull: Normal. Negative for fracture or focal lesion. Sinuses/Orbits: No acute finding. Other: None. IMPRESSION: No acute intracranial findings. Chronic ischemic microvascular disease and age related atrophic change. Small old lacunar infarct in the left periventricular white matter. Electronically Signed   By: Marin Olp M.D.   On: 04/30/2017 10:13    Procedures Procedures (including critical care time)  Medications Ordered in ED Medications  levETIRAcetam (KEPPRA) 1,000 mg in sodium chloride 0.9 % 100 mL IVPB (1,000 mg Intravenous New Bag/Given 05/01/17 1216)  cefTRIAXone (ROCEPHIN) 1 g in dextrose 5 % 50 mL IVPB (0 g Intravenous Stopped 05/01/17 1204)  sodium chloride 0.9 % bolus 1,000 mL (1,000 mLs Intravenous New Bag/Given 05/01/17 1103)     Initial Impression / Assessment and Plan / ED Course  I have reviewed the triage vital signs and the nursing notes.  Pertinent labs & imaging results that were available during my care of the patient were reviewed by me and considered in my medical decision making (see chart for details).     Increasing confusion and likely delirium in the setting of culture positive urinary tract infection.  IV Rocephin now.  Her episode of staring this morning  and her episode of  unresponsiveness yesterday sound most consistent with seizure.  Patient be low with IV Keppra now.  She'll need to be monitored for ongoing seizure activities if she has any recurrent seizures in the emergency department may benefit from neurology consultation.  Head CT without acute abnormality.  No need for acute neurology involvement at this time    Final Clinical Impressions(s) / ED Diagnoses   Final diagnoses:  Altered mental status, unspecified altered mental status type  Acute cystitis without hematuria    New Prescriptions New Prescriptions   No medications on file     Jola Schmidt, MD 05/01/17 1225

## 2017-05-01 NOTE — ED Notes (Signed)
Charge RN called to room - pt's family asking for patient advocate and advocate not available in ED at this time. Concerned as patient has become more hypertensive and has not been given ice, oral meds, food etc. Concerned that patient's IV bag as been going in "all day." Unhappy about communications from staff and MDs regarding results. Apologized for delays, confusion and explained reasoning to the questions. Family members appreciated explanations, time spent by leadership. En route to unit at this time.

## 2017-05-02 DIAGNOSIS — N3000 Acute cystitis without hematuria: Secondary | ICD-10-CM | POA: Diagnosis present

## 2017-05-02 DIAGNOSIS — Z87891 Personal history of nicotine dependence: Secondary | ICD-10-CM | POA: Diagnosis not present

## 2017-05-02 DIAGNOSIS — N179 Acute kidney failure, unspecified: Secondary | ICD-10-CM | POA: Diagnosis present

## 2017-05-02 DIAGNOSIS — K219 Gastro-esophageal reflux disease without esophagitis: Secondary | ICD-10-CM | POA: Diagnosis present

## 2017-05-02 DIAGNOSIS — Z8349 Family history of other endocrine, nutritional and metabolic diseases: Secondary | ICD-10-CM | POA: Diagnosis not present

## 2017-05-02 DIAGNOSIS — Z881 Allergy status to other antibiotic agents status: Secondary | ICD-10-CM | POA: Diagnosis not present

## 2017-05-02 DIAGNOSIS — M19041 Primary osteoarthritis, right hand: Secondary | ICD-10-CM | POA: Diagnosis present

## 2017-05-02 DIAGNOSIS — Z8521 Personal history of malignant neoplasm of larynx: Secondary | ICD-10-CM | POA: Diagnosis not present

## 2017-05-02 DIAGNOSIS — I129 Hypertensive chronic kidney disease with stage 1 through stage 4 chronic kidney disease, or unspecified chronic kidney disease: Secondary | ICD-10-CM | POA: Diagnosis present

## 2017-05-02 DIAGNOSIS — I700 Atherosclerosis of aorta: Secondary | ICD-10-CM | POA: Diagnosis present

## 2017-05-02 DIAGNOSIS — J449 Chronic obstructive pulmonary disease, unspecified: Secondary | ICD-10-CM | POA: Diagnosis present

## 2017-05-02 DIAGNOSIS — Z8711 Personal history of peptic ulcer disease: Secondary | ICD-10-CM | POA: Diagnosis not present

## 2017-05-02 DIAGNOSIS — R4182 Altered mental status, unspecified: Secondary | ICD-10-CM | POA: Diagnosis present

## 2017-05-02 DIAGNOSIS — Z823 Family history of stroke: Secondary | ICD-10-CM | POA: Diagnosis not present

## 2017-05-02 DIAGNOSIS — Z96653 Presence of artificial knee joint, bilateral: Secondary | ICD-10-CM | POA: Diagnosis present

## 2017-05-02 DIAGNOSIS — I2510 Atherosclerotic heart disease of native coronary artery without angina pectoris: Secondary | ICD-10-CM | POA: Diagnosis present

## 2017-05-02 DIAGNOSIS — I739 Peripheral vascular disease, unspecified: Secondary | ICD-10-CM | POA: Diagnosis present

## 2017-05-02 DIAGNOSIS — Z9842 Cataract extraction status, left eye: Secondary | ICD-10-CM | POA: Diagnosis not present

## 2017-05-02 DIAGNOSIS — I4891 Unspecified atrial fibrillation: Secondary | ICD-10-CM | POA: Diagnosis present

## 2017-05-02 DIAGNOSIS — Z800 Family history of malignant neoplasm of digestive organs: Secondary | ICD-10-CM | POA: Diagnosis not present

## 2017-05-02 DIAGNOSIS — E785 Hyperlipidemia, unspecified: Secondary | ICD-10-CM | POA: Diagnosis present

## 2017-05-02 DIAGNOSIS — M19042 Primary osteoarthritis, left hand: Secondary | ICD-10-CM | POA: Diagnosis present

## 2017-05-02 DIAGNOSIS — Z885 Allergy status to narcotic agent status: Secondary | ICD-10-CM | POA: Diagnosis not present

## 2017-05-02 DIAGNOSIS — Z9841 Cataract extraction status, right eye: Secondary | ICD-10-CM | POA: Diagnosis not present

## 2017-05-02 DIAGNOSIS — N183 Chronic kidney disease, stage 3 (moderate): Secondary | ICD-10-CM | POA: Diagnosis present

## 2017-05-02 DIAGNOSIS — R569 Unspecified convulsions: Secondary | ICD-10-CM

## 2017-05-02 LAB — URINE CULTURE: Culture: >=100000 — AB

## 2017-05-02 MED ORDER — BISOPROLOL FUMARATE 5 MG PO TABS
5.0000 mg | ORAL_TABLET | Freq: Every day | ORAL | Status: DC
  Administered 2017-05-02 – 2017-05-03 (×3): 5 mg via ORAL
  Filled 2017-05-02 (×3): qty 1

## 2017-05-02 MED ORDER — ONDANSETRON HCL 4 MG/2ML IJ SOLN: 4.0000 mg | Freq: Four times a day (QID) | INTRAMUSCULAR | Status: DC | PRN

## 2017-05-02 MED ORDER — SODIUM CHLORIDE 0.9 % IV SOLN
INTRAVENOUS | Status: DC
Start: 2017-05-02 — End: 2017-05-03
  Administered 2017-05-02 (×2): via INTRAVENOUS

## 2017-05-02 MED ORDER — ACETAMINOPHEN 650 MG RE SUPP
650.0000 mg | Freq: Four times a day (QID) | RECTAL | Status: DC | PRN
Start: 2017-05-02 — End: 2017-05-03

## 2017-05-02 MED ORDER — ONDANSETRON HCL 4 MG PO TABS: 4.0000 mg | ORAL_TABLET | Freq: Four times a day (QID) | ORAL | Status: DC | PRN

## 2017-05-02 MED ORDER — ACETAMINOPHEN 325 MG PO TABS: 650.0000 mg | ORAL_TABLET | Freq: Four times a day (QID) | ORAL | Status: DC | PRN

## 2017-05-02 MED ORDER — DEXTROSE 5 % IV SOLN
1.0000 g | INTRAVENOUS | Status: DC
  Administered 2017-05-02: 1 g via INTRAVENOUS
  Filled 2017-05-02 (×2): qty 10

## 2017-05-02 MED ORDER — ATORVASTATIN CALCIUM 20 MG PO TABS
20.0000 mg | ORAL_TABLET | Freq: Every day | ORAL | Status: DC
  Administered 2017-05-02 – 2017-05-03 (×2): 20 mg via ORAL
  Filled 2017-05-02 (×2): qty 1

## 2017-05-02 MED ORDER — LEVETIRACETAM 500 MG PO TABS
1000.0000 mg | ORAL_TABLET | Freq: Two times a day (BID) | ORAL | Status: DC
  Administered 2017-05-02 – 2017-05-03 (×4): 1000 mg via ORAL
  Filled 2017-05-02 (×4): qty 2

## 2017-05-02 MED ORDER — RIVAROXABAN 15 MG PO TABS
15.0000 mg | ORAL_TABLET | Freq: Every day | ORAL | Status: DC
  Administered 2017-05-02 (×2): 15 mg via ORAL
  Filled 2017-05-02 (×2): qty 1

## 2017-05-02 MED ORDER — GABAPENTIN 100 MG PO CAPS
100.0000 mg | ORAL_CAPSULE | Freq: Every day | ORAL | Status: DC
  Administered 2017-05-02 (×2): 100 mg via ORAL
  Filled 2017-05-02 (×2): qty 1

## 2017-05-02 MED ORDER — FAMOTIDINE 20 MG PO TABS
20.0000 mg | ORAL_TABLET | Freq: Every day | ORAL | Status: DC
  Administered 2017-05-02 (×2): 20 mg via ORAL
  Filled 2017-05-02 (×2): qty 1

## 2017-05-02 NOTE — Progress Notes (Signed)
PROGRESS NOTE    [name]  QIH:[id number] DOB: 12-05-1927 DOA: 05/01/2017 PCP: Marin Olp, MD   BriefNarrative89 y.o. female with medical history significant of seizure disorder, atrial fibrillation, COPD, GERD. Patient's family reports that she's had generalized weakness, which is progressively worsening over the past several weeks. By mouth intake has remained poor. Over the past 2-3 days, she's had a rapid decline. Family is unsure whether she has been febrile. She did suffer a seizure yesterday and was brought to the emergency room for evaluation. It was noted that she may have recurrent tract infection, she received a dose of Rocephin and was discharged home with Keflex. She is brought back to the emergency room today after suffering another seizure. She was noted to be postictal for approximately 2 hours. She's not had any nausea, vomiting, diarrhea, cough. She does not complain of dysuria. Urine culture that was drawn yesterday is positive for gram-negative rods. Since she had another seizure this morning, she was loaded with Keppra in the er.  Assessment & Plan:   Active Problems:   Essential hypertension   Atrial fibrillation (HCC)   AKI (acute kidney injury) ([place])   Weakness generalized   CKD (chronic kidney disease), stage III   UTI (urinary tract infection)   Seizure ([place])  1. Urinary tract infection. Follow-up urine culture, currently growing gram-negative rods. Continue on Rocephin. 2. Seizure disorder. Patient has had 2 breakthrough seizures yesterday and today. She's not been started on any new precipitating medications. Case was discussed with Dr. Leonel Ramsay on call for neurology who recommended increasing her Keppra dose to 1000 mg twice a day . if she continues to have breakthrough seizure, could consider formal neurology consultation. I suspect that her urinary tract infection has lowered her seizure threshold.she has not had any further seizures  here. 3. Acute kidney injury and chronic kidney disease stage III. She has had a mild bump in creatinine related to dehydration. Continue IV fluids and recheck labs in a.m . 4. Atrial fibrillation. Currently in sinus rhythm. Continue on bisoprolol. She is anticoagulated with xarelto. 5. Hypertension. Continue bisoprolol. Pressures currently stable 6. Generalized weakness. We'll get physical therapy evaluation.  DVT prophylaxis xarelto  Code Status: full Family Communication: none Disposition Plan:  Home with family  Consultants: none   Procedures: none Antimicrobials:  rocephin  Subjective:denies head ache,nausea,vomiting.   Objective:sitting by the side of the bed I no distress.answered my questions appropriately Vitals:   05/01/17 2030 05/01/17 2103 05/02/17 0514 05/02/17 1602  BP: (!) 164/102 (!) 173/91 (!) 153/80 121/65  Pulse:  95 82 78  Resp:  20 19 20   Temp:  (!) 100.7 F (38.2 C) 98.7 F (37.1 C) 97.9 F (36.6 C)  TempSrc:  Oral Oral Oral  SpO2:  92% 90% 96%  Weight:  85.3 kg (188 lb)    Height:  5\' 4"  (1.626 m)      Intake/Output Summary (Last 24 hours) at 05/02/17 1711 Last data filed at 05/02/17 1500  Gross per 24 hour  Intake             2230 ml  Output                0 ml  Net             2230 ml   Filed Weights   05/01/17 0849 05/01/17 2103  Weight: 86.6 kg (191 lb) 85.3 kg (188 lb)    Examination:  General exam: Appears calm and  comfortable  Respiratory system: Clear to auscultation. Respiratory effort normal. Cardiovascular system: S1 & S2 heard, RRR. No JVD, murmurs, rubs, gallops or clicks. No pedal edema. Gastrointestinal system: Abdomen is nondistended, soft and nontender. No organomegaly or masses felt. Normal bowel sounds heard. Central nervous system: Alert and oriented. No focal neurological deficits. Extremities: Symmetric 5 x 5 power. Skin: No rashes, lesions or ulcers Psychiatry: Judgement and insight appear normal. Mood & affect  appropriate.     Data Reviewed: I have personally reviewed following labs and imaging studies  CBC:  Recent Labs Lab 04/30/17 0950 05/01/17 0915  WBC 6.3 6.0  HGB 11.9* 11.8*  HCT 37.1 37.5  MCV 86.3 87.4  PLT 146* 010*   Basic Metabolic Panel:  Recent Labs Lab 04/30/17 0950 05/01/17 0915  NA 141 136  K 3.7 3.7  CL 106 103  CO2 27 25  GLUCOSE 118* 119*  BUN 14 15  CREATININE 1.32* 1.43*  CALCIUM 8.9 9.1   GFR: Estimated Creatinine Clearance: 29.3 mL/min (A) (by C-G formula based on SCr of 1.43 mg/dL (H)). Liver Function Tests:  Recent Labs Lab 04/30/17 0950 05/01/17 0915  AST 12* 14*  ALT 9* 9*  ALKPHOS 71 58  BILITOT 1.3* 1.4*  PROT 7.3 6.6  ALBUMIN 3.7 3.7   No results for input(s): LIPASE, AMYLASE in the last 168 hours. No results for input(s): AMMONIA in the last 168 hours. Coagulation Profile: No results for input(s): INR, PROTIME in the last 168 hours. Cardiac Enzymes: No results for input(s): CKTOTAL, CKMB, CKMBINDEX, TROPONINI in the last 168 hours. BNP (last 3 results)  Recent Labs  03/22/17 1149  PROBNP 243.0*   HbA1C: No results for input(s): HGBA1C in the last 72 hours. CBG:  Recent Labs Lab 04/30/17 0950  GLUCAP 113*   Lipid Profile: No results for input(s): CHOL, HDL, LDLCALC, TRIG, CHOLHDL, LDLDIRECT in the last 72 hours. Thyroid Function Tests: No results for input(s): TSH, T4TOTAL, FREET4, T3FREE, THYROIDAB in the last 72 hours. Anemia Panel: No results for input(s): VITAMINB12, FOLATE, FERRITIN, TIBC, IRON, RETICCTPCT in the last 72 hours. Sepsis Labs:  Recent Labs Lab 04/30/17 1001 05/01/17 0934  LATICACIDVEN 0.94 0.91    Recent Results (from the past 240 hour(s))  Urine culture     Status: Abnormal   Collection Time: 04/30/17 10:00 AM  Result Value Ref Range Status   Specimen Description URINE, CLEAN CATCH  Final   Special Requests NONE  Final   Culture >=100,000 COLONIES/mL KLEBSIELLA PNEUMONIAE (A)  Final    Report Status 05/02/2017 FINAL  Final   Organism ID, Bacteria KLEBSIELLA PNEUMONIAE (A)  Final      Susceptibility   Klebsiella pneumoniae - MIC*    AMPICILLIN >=32 RESISTANT Resistant     CEFAZOLIN <=4 SENSITIVE Sensitive     CEFTRIAXONE <=1 SENSITIVE Sensitive     CIPROFLOXACIN <=0.25 SENSITIVE Sensitive     GENTAMICIN <=1 SENSITIVE Sensitive     IMIPENEM <=0.25 SENSITIVE Sensitive     NITROFURANTOIN <=16 SENSITIVE Sensitive     TRIMETH/SULFA <=20 SENSITIVE Sensitive     AMPICILLIN/SULBACTAM 8 SENSITIVE Sensitive     PIP/TAZO <=4 SENSITIVE Sensitive     Extended ESBL NEGATIVE Sensitive     * >=100,000 COLONIES/mL KLEBSIELLA PNEUMONIAE  Blood culture (routine x 2)     Status: None (Preliminary result)   Collection Time: 05/01/17 11:10 AM  Result Value Ref Range Status   Specimen Description BLOOD LEFT FOREARM  Final   Special Requests  Final    BOTTLES DRAWN AEROBIC AND ANAEROBIC Blood Culture adequate volume   Culture NO GROWTH 1 DAY  Final   Report Status PENDING  Incomplete  Blood culture (routine x 2)     Status: None (Preliminary result)   Collection Time: 05/01/17 11:10 AM  Result Value Ref Range Status   Specimen Description BLOOD RIGHT ANTECUBITAL  Final   Special Requests   Final    BOTTLES DRAWN AEROBIC AND ANAEROBIC Blood Culture results may not be optimal due to an excessive volume of blood received in culture bottles   Culture NO GROWTH 1 DAY  Final   Report Status PENDING  Incomplete         Radiology Studies: Dg Chest 2 View  Result Date: 05/01/2017 CLINICAL DATA:  Shortness of breath.  Cough and weakness EXAM: CHEST  2 VIEW COMPARISON:  Yesterday FINDINGS: Asymmetric retrocardiac density in the frontal projection is not visualized laterally and likely summation shadows and mild atelectasis. No edema, effusion, or suspected consolidation. Normal heart size. Mild aortic tortuosity. IMPRESSION: Low volume chest without acute finding when compared to prior.  If persistent or progressive respiratory symptoms, a short follow-up is suggested to re-evaluate the left base. Electronically Signed   By: Monte Fantasia M.D.   On: 05/01/2017 11:01   Ct Head Wo Contrast  Result Date: 05/01/2017 CLINICAL DATA:  Altered mental status.  History of CVA and seizures. EXAM: CT HEAD WITHOUT CONTRAST TECHNIQUE: Contiguous axial images were obtained from the base of the skull through the vertex without intravenous contrast. COMPARISON:  CT head 04/30/2017 and 08/05/2014. FINDINGS: Brain: There is no evidence of acute intracranial hemorrhage, mass lesion, brain edema or extra-axial fluid collection. The ventricles and subarachnoid spaces are appropriately sized for age. There is no CT evidence of acute cortical infarction. Chronic small vessel ischemic changes are again noted within the periventricular and subcortical white matter with asymmetric involvement of the left basal ganglia. Vascular: Intracranial vascular calcifications. No hyperdense vessel identified. Skull: Negative for fracture or focal lesion. Sinuses/Orbits: Cerumen noted in both external auditory canals. The visualized paranasal sinuses and mastoid air cells are clear. No orbital abnormalities are seen. There are postsurgical changes in both globes. Other: None. IMPRESSION: No acute intracranial findings. Stable chronic small vessel ischemic changes. Electronically Signed   By: Richardean Sale M.D.   On: 05/01/2017 10:35        Scheduled Meds: . atorvastatin  20 mg Oral Daily  . bisoprolol  5 mg Oral Daily  . famotidine  20 mg Oral QHS  . gabapentin  100 mg Oral QHS  . levETIRAcetam  1,000 mg Oral BID  . Rivaroxaban  15 mg Oral Q supper   Continuous Infusions: . sodium chloride 100 mL/hr at 05/02/17 1130  . cefTRIAXone (ROCEPHIN)  IV Stopped (05/02/17 1200)     LOS: 0 days       Georgette Shell, MD [facility]   If 7PM-7AM, please contact night-coverage [email or web address] Password  [email or web address] 05/02/2017, 5:11 PM

## 2017-05-02 NOTE — Progress Notes (Signed)
Pt admitted to unit via transport stretcher at 2115 and was assisted into be safely. Pt  A & O x 2. Inc b/b. Self removed #20 to L wrist because "it was bothering me." New # 24 placed to right hand and wrapped with kerlix to maintain patency. Ns infusing at 19ml per order. Without any noted s/s of pain via grimace, groan or moan. T/c placed to on call provider to inform of pt's refusal. Pt also refused am labs this shift. Safety maintained. callbell within reach. Will continue to monitor.

## 2017-05-02 NOTE — Evaluation (Signed)
Physical Therapy Evaluation Patient Details Name: [name] MRN: [id number] DOB: 02-03-1928 Today's Date: 05/02/2017   History of Present Illness  [name] is a 83 y.o. female with medical history significant of seizure disorder, atrial fibrillation, COPD, GERD. Patient's family reports that she's had generalized weakness, which is progressively worsening over the past several weeks. By mouth intake has remained poor. Over the past 2-3 days, she's had a rapid decline. Family is unsure whether she has been febrile. She did suffer a seizure yesterday and was brought to the emergency room for evaluation. It was noted that she may have recurrent tract infection, she received a dose of Rocephin and was discharged home with Keflex. She is brought back to the emergency room today after suffering another seizure. She was noted to be postictal for approximately 2 hours. She's not had any nausea, vomiting, diarrhea, cough.  Clinical Impression   Pt admitted with above diagnosis. Pt currently with functional limitations due to the deficits listed below (see PT Problem List).    [name] showed paranoid-like behaviors during session, choosing not to give information re: her home setup, home equipment, and prior level of function ("It's not your concern"); when I explained to her that my job is to make recommendations to make getting home better, easier, and safer, and that I need that information to make recommendations, she told me to "let go of that part of the job"; It is possible that this interaction withholding information is masking memory deficits (did not see short-term memory deficits in her past medical history); It could alos represent a cognitive decline; she agreed that we were in [place], but did not agree that we were at [facility]; Will need time to establish rapport and trust to garner participation;   Will need further information re: prior level of function, home  setup and available assist, and home equipment;    Pt will benefit from skilled PT to increase their independence and safety with mobility to allow discharge to the venue listed below.       Follow Up Recommendations Home health PT;Supervision/Assistance - 24 hour;Other (comment) ([facility] SN[facility] if no 24 hour assist)    Equipment Recommendations  Rolling walker with 5" wheels;3in1 (PT) (May already have)    Recommendations for Other Services OT consult     Precautions / Restrictions Precautions Precautions: Fall      Mobility  Bed Mobility                  Transfers Overall transfer level: Needs assistance   Transfers: Sit to/from Stand Sit to Stand: Min guard         General transfer comment: Close guard for safety to stand from bed at standard height; did not accept physical assist, noted dependent on UE push and support for balance in standing  Ambulation/Gait             General Gait Details: Declined  Stairs            Wheelchair Mobility    Modified Rankin (Stroke Patients Only)       Balance                                             Pertinent Vitals/Pain Pain Assessment: No/denies pain    Home Living Family/patient expects to be discharged to:: Private residence  Living Arrangements: Spouse/significant other;Other relatives Available Help at Discharge: Family (Will need more information) Type of Home: House Home Access: Level entry (per chart review as of 4/17, her haome has level entry)     Home Layout: One level Home Equipment: Other (comment) (Oxygen 2L at baseline) Additional Comments: Pt not forthcoming with information re: home setup and prior level of function; Will need to verify above info and find out more about bathroom setup and home assist; she did volunteer that she is on Home O2 2L at baseline    Prior Function           Comments: Unknown; Pt chose not to give any information re: PLOF or  home situation; will need more reliable info from family members     Hand Dominance        Extremity/Trunk Assessment   Upper Extremity Assessment Upper Extremity Assessment: Difficult to assess due to impaired cognition    Lower Extremity Assessment Lower Extremity Assessment: Generalized weakness (Dependent on UE support for sit to stand )       Communication   Communication: No difficulties  Cognition Arousal/Alertness: Awake/alert Behavior During Therapy: Agitated Overall Cognitive Status: No family/caregiver present to determine baseline cognitive functioning Area of Impairment: Orientation                 Orientation Level: Disoriented to;Place;Time;Situation             General Comments: [name] showed paranoid-like behaviors during session, choosing not to give information re: her home setup, home equipment, and prior level of function; when I explained to her that my job is to make recommendations to make getting home better, easier, and safer, and that I need that information to make recommendations, she told me to "let go of that part of the job"; It is possible that this interaction withholding information is masking memory deficits (did not see short-term memory deficits in her past medical history); It could alos represent a cognitive decline; she agreed that we were in [place], but did not agree that we were at [place] comments (skin integrity, edema, etc.): Incontinent of urine    Exercises     Assessment/Plan    PT Assessment Patient needs continued PT services (Trial basis, pending ability to participate)  PT Problem List Decreased strength;Decreased activity tolerance;Decreased balance;Decreased mobility;Decreased cognition;Decreased knowledge of use of DME;Decreased safety awareness;Decreased knowledge of precautions       PT Treatment Interventions DME instruction;Gait training;Functional  mobility training;Therapeutic activities;Therapeutic exercise;Balance training;Neuromuscular re-education;Cognitive remediation;Patient/family education    PT Goals (Current goals can be found in the Care Plan section)  Acute Rehab PT Goals Patient Stated Goal: Tells me she is going home today PT Goal Formulation: Patient unable to participate in goal setting Time For Goal Achievement: 05/16/17 Potential to Achieve Goals: Good    Frequency Min 3X/week   Barriers to discharge   Need to verify 24 hour assist at home; if not 24 hour assist, consider SNF for rehab, although I anticipate she would better participate in familiar environment with familiar routine and familiar caregivers    Co-evaluation               AM-PAC PT "6 Clicks" Daily Activity  Outcome Measure Difficulty turning over in bed (including adjusting bedclothes, sheets and blankets)?: A Little Difficulty moving from lying on back to sitting on the side of the bed? : A Little Difficulty sitting  down on and standing up from a chair with arms (e.g., wheelchair, bedside commode, etc,.)?: A Little Help needed moving to and from a bed to chair (including a wheelchair)?: A Little Help needed walking in hospital room?: A Lot Help needed climbing 3-5 steps with a railing? : A Lot 6 Click Score: 16    End of Session Equipment Utilized During Treatment: Oxygen Activity Tolerance: Other (comment) (limtied by decr participation) Patient left: in bed;with bed alarm set;with call bell/phone within reach;Other (comment) (Sitting EOB)   PT Visit Diagnosis: Muscle weakness (generalized) (M62.81)    Time: 2878-6767 PT Time Calculation (min) (ACUTE ONLY): 19 min   Charges:   PT Evaluation $PT Eval Moderate Complexity: 1 Mod     PT G Codes:   PT G-Codes **NOT FOR INPATIENT CLASS** Functional Assessment Tool Used: Clinical judgement Functional Limitation: Mobility: Walking and moving around Mobility: Walking and Moving  Around Current Status (M0947): At least 20 percent but less than 40 percent impaired, limited or restricted Mobility: Walking and Moving Around Goal Status [phone]): 0 percent impaired, limited or restricted    Roney Marion, PT  [place] Pager [phone] Office [place] 05/02/2017, 10:09 AM

## 2017-05-03 ENCOUNTER — Telehealth: Payer: Self-pay | Admitting: Emergency Medicine

## 2017-05-03 DIAGNOSIS — R4182 Altered mental status, unspecified: Secondary | ICD-10-CM

## 2017-05-03 LAB — CBC WITH DIFFERENTIAL/PLATELET
BASOS ABS: 0 10*3/uL (ref 0.0–0.1)
Basophils Relative: 0 %
EOS ABS: 0.1 10*3/uL (ref 0.0–0.7)
EOS PCT: 2 %
HCT: 37.9 % (ref 36.0–46.0)
Hemoglobin: 11.6 g/dL — ABNORMAL LOW (ref 12.0–15.0)
Lymphocytes Relative: 20 %
Lymphs Abs: 1.2 10*3/uL (ref 0.7–4.0)
MCH: 26.8 pg (ref 26.0–34.0)
MCHC: 30.6 g/dL (ref 30.0–36.0)
MCV: 87.5 fL (ref 78.0–100.0)
Monocytes Absolute: 0.7 10*3/uL (ref 0.1–1.0)
Monocytes Relative: 13 %
Neutro Abs: 3.9 10*3/uL (ref 1.7–7.7)
Neutrophils Relative %: 65 %
PLATELETS: 141 10*3/uL — AB (ref 150–400)
RBC: 4.33 MIL/uL (ref 3.87–5.11)
RDW: 14.6 % (ref 11.5–15.5)
WBC: 5.9 10*3/uL (ref 4.0–10.5)

## 2017-05-03 LAB — BASIC METABOLIC PANEL
ANION GAP: 8 (ref 5–15)
BUN: 16 mg/dL (ref 6–20)
CALCIUM: 8.9 mg/dL (ref 8.9–10.3)
CO2: 27 mmol/L (ref 22–32)
Chloride: 104 mmol/L (ref 101–111)
Creatinine, Ser: 1.08 mg/dL — ABNORMAL HIGH (ref 0.44–1.00)
GFR calc Af Amer: 52 mL/min — ABNORMAL LOW (ref >60)
GFR, EST NON AFRICAN AMERICAN: 45 mL/min — AB (ref >60)
Glucose, Bld: 98 mg/dL (ref 65–99)
POTASSIUM: 4 mmol/L (ref 3.5–5.1)
SODIUM: 139 mmol/L (ref 135–145)

## 2017-05-03 MED ORDER — CIPROFLOXACIN HCL 500 MG PO TABS
500.0000 mg | ORAL_TABLET | Freq: Two times a day (BID) | ORAL | Status: DC
  Administered 2017-05-03: 500 mg via ORAL
  Filled 2017-05-03: qty 1

## 2017-05-03 MED ORDER — CIPROFLOXACIN HCL 500 MG PO TABS: 500.0000 mg | ORAL_TABLET | Freq: Two times a day (BID) | ORAL | 0 refills | Status: DC

## 2017-05-03 MED ORDER — LEVETIRACETAM 1000 MG PO TABS
1000.0000 mg | ORAL_TABLET | Freq: Two times a day (BID) | ORAL | 0 refills | Status: DC
Start: 2017-05-03 — End: 2017-12-13

## 2017-05-03 NOTE — Progress Notes (Signed)
Pt is calm and follows commend, is been discharged home with family, is aware of follow up plans and discharged teaching was done with pt and family.

## 2017-05-03 NOTE — Telephone Encounter (Signed)
Called and spoke to [place] with pulmonary rehab. Thayer Headings states Truddie Crumble is on vacation for the week and [name] is unavailable at this time. A message will be given to [facility] to call back. Will await call.

## 2017-05-03 NOTE — Telephone Encounter (Signed)
Post ED Visit - Positive Culture Follow-up  Culture report reviewed by antimicrobial stewardship pharmacist:  []  Elenor Quinones, Pharm.D. []  Heide Guile, Pharm.D., BCPS AQ-ID []  Parks Neptune, Pharm.D., BCPS []  Alycia Rossetti, Pharm.D., BCPS []  [place], Pharm.D., BCPS, AAHIVP []  Legrand Como, Pharm.D., BCPS, AAHIVP []  Salome Arnt, PharmD, BCPS []  Dimitri Ped, PharmD, BCPS []  Vincenza Hews, PharmD, BCPS Pearson Grippe PharmD  Positive urine culture Treated with cephalexin, organism sensitive to the same and no further patient follow-up is required at this time.  Hazle Nordmann 05/03/2017, 1:14 PM

## 2017-05-03 NOTE — Discharge Summary (Signed)
Physician Discharge Summary  [name] WNI:[id number] DOB: 09/06/1927 DOA: 05/01/2017  PCP: Marin Olp, MD  Admit date: 05/01/2017 Discharge date: 05/03/2017  Time spent: 25 minutes  Recommendations for Outpatient Follow-up:  1. Please note the dosage change of Keppra from 500 to 1000 twice a day 2. New medications ciprofloxacin 1 tablet 2 times a day for 3 days 3. We'll cC her primary neurologist Dr. Tomi Likens regarding her care and admission 4. Recommend outpatient therapy as a follow-up 5. Patient is beyond the age of 83 and has been reports that she is having some dark stool at times and was noted to have hemorrhoids--I will defer to the collective decision of her primary physician to determine if the patient would benefit from a colonoscopy--she is not having melena and is having formed stools from her own account--anusol suppository was recommended on discharge/witch hazel/Tucks pad--would continue Xarelto regardless-hemoglobin has been stable in the 11-13 range  Discharge Diagnoses:  Active Problems:   Essential hypertension   Atrial fibrillation (HCC)   AKI (acute kidney injury) ([place])   Weakness generalized   CKD (chronic kidney disease), stage III ([place])   UTI (urinary tract infection)   Seizure ([place])   Seizures ([place])   Discharge Condition: improved  Diet recommendation: heart healthy low-salt  Filed Weights   05/01/17 0849 05/01/17 2103  Weight: 86.6 kg (191 lb) 85.3 kg (188 lb)    History of present illness: 83 y.o.  seizure disorder,  atrial fibrillation,  COPD,  GERD. History of large B-cell lymphoma History of laryngeal CA followed by oncology History of cardioembolic stroke      Patient's family reports that she's had generalized weakness, which is progressively worsening over the past several weeks. By mouth intake has remained poor. Over the past 2-3 days, she's had a rapid decline.    Family is unsure whether she has been febrile.    She did  suffer a seizure yesterday and was brought to the emergency room for evaluation. It was noted that she may have recurrent tract infection, she received a dose of Rocephin and was discharged home with Keflex.    She is brought back to the emergency room today after suffering another seizure.     postictal for approximately 2 hours. She's not had any nausea, vomiting, diarrhea, cough. She does not complain of dysuria.   Urine culture that was drawn yesterday is positive for gram-negative rods. Since she had another seizure this morning, she was loaded with Keppra in the er.    Hospital Course:  Patient was hospitalized to inpatient status and was loaded with Her and did not sustain any further seizures Urinary culture showed that the patient had a Klebsiella UTI/cystitis and that the patient had pansensitive Klebsiella therefore patient was placed on ciprofloxacin on discharge Therapy saw the patient and evaluated her and felt that she was stable for discharge 24 7 assistance physical and occupational therapy follow-up She was seen on day of discharge and found to be stable without any further issue Patient was noted to have hemorrhoids and was recommended to have OTC witch hazel or Anusol--her stools are formed and they are not melanotic  Procedures:  none   Consultations:  neurology telephone consulted on admission  Discharge Exam: Vitals:   05/02/17 1602 05/02/17 2121  BP: 121/65 (!) 108/49  Pulse: 78 83  Resp: 20 18  Temp: 97.9 F (36.6 C) 98.4 F (36.9 C)  SpO2: 96% 97%    General: alert pleasant  and oriented no distress Quiet but alert and oriented Seizure-free overnight And did not have breakfast but did not sleep well overnight Cardiovascular: S1-S2 no murmur rub or gallop Respiratory: clinically clear Abdomen soft nontender nondistended Hemorrhoid 12:00 to 6:00 nonbleeding  Discharge Instructions    Current Discharge Medication List    START taking these  medications   Details  ciprofloxacin (CIPRO) 500 MG tablet Take 1 tablet (500 mg total) by mouth 2 (two) times daily. Qty: 6 tablet, Refills: 0      CONTINUE these medications which have CHANGED   Details  levETIRAcetam (KEPPRA) 1000 MG tablet Take 1 tablet (1,000 mg total) by mouth 2 (two) times daily. Qty: 60 tablet, Refills: 0      CONTINUE these medications which have NOT CHANGED   Details  acetaminophen (TYLENOL) 650 MG CR tablet Take 650 mg by mouth every 8 (eight) hours as needed for pain. Reported on 12/09/2015    atorvastatin (LIPITOR) 20 MG tablet TAKE 1 TABLET BY MOUTH ONCE DAILY Qty: 30 tablet, Refills: 5    bisoprolol (ZEBETA) 5 MG tablet Take 1 tablet (5 mg total) by mouth daily. Qty: 30 tablet, Refills: 5    cholecalciferol (VITAMIN D) 1000 UNITS tablet Take 1,000 Units by mouth daily.    Cyanocobalamin (VITAMIN B-12 PO) Take 1 tablet by mouth daily.    famotidine (PEPCID) 20 MG tablet Take 20 mg by mouth at bedtime.    furosemide (LASIX) 20 MG tablet Take 1 tablet (20 mg total) by mouth daily as needed for edema. Qty: 30 tablet, Refills: 2    gabapentin (NEURONTIN) 100 MG capsule TAKE ONE CAPSULE BY MOUTH THREE TIMES DAILY Qty: 270 capsule, Refills: 1    loperamide (IMODIUM) 2 MG capsule Take 2 mg by mouth daily as needed for diarrhea or loose stools.     Tiotropium Bromide-Olodaterol (STIOLTO RESPIMAT) 2.5-2.5 MCG/ACT AERS Inhale 2 puffs into the lungs daily. Qty: 1 Inhaler, Refills: 6    VENTOLIN HFA 108 (90 Base) MCG/ACT inhaler Inhale 2 puffs into the lungs every 6 (six) hours as needed for wheezing or shortness of breath.     XARELTO 15 MG TABS tablet TAKE ONE TABLET BY MOUTH ONCE DAILY WITH  SUPPER Qty: 90 tablet, Refills: 3      STOP taking these medications     cephALEXin (KEFLEX) 500 MG capsule        Allergies  Allergen Reactions  . Ativan [Lorazepam] Other (See Comments)    Severe hallucinations   . Propoxyphene Hcl   . Codeine Itching   . Penicillins Rash  . Vancomycin Rash   Follow-up Information    Marin Olp, MD.   Specialty:  Family Medicine Contact information: [address] [place] [address] [phone]            The results of significant diagnostics from this hospitalization (including imaging, microbiology, ancillary and laboratory) are listed below for reference.    Significant Diagnostic Studies: Dg Chest 2 View  Result Date: 05/01/2017 CLINICAL DATA:  Shortness of breath.  Cough and weakness EXAM: CHEST  2 VIEW COMPARISON:  Yesterday FINDINGS: Asymmetric retrocardiac density in the frontal projection is not visualized laterally and likely summation shadows and mild atelectasis. No edema, effusion, or suspected consolidation. Normal heart size. Mild aortic tortuosity. IMPRESSION: Low volume chest without acute finding when compared to prior. If persistent or progressive respiratory symptoms, a short follow-up is suggested to re-evaluate the left base. Electronically Signed   By:  Monte Fantasia M.D.   On: 05/01/2017 11:01   Dg Chest 2 View  Result Date: 04/30/2017 CLINICAL DATA:  Altered mental status. Shortness of breath this morning EXAM: CHEST  2 VIEW COMPARISON:  04/09/2017 FINDINGS: Chronic cardiomegaly. Stable mediastinal contours. There is interstitial coarsening that is similar to priors. Patient has history of COPD. No Kerley lines, air bronchogram, effusion, or pneumothorax. Biapical pleural based thickening. IMPRESSION: No acute finding when compared to prior. Chronic cardiomegaly and interstitial opacities. Electronically Signed   By: Monte Fantasia M.D.   On: 04/30/2017 09:41   Dg Chest 2 View  Result Date: 04/09/2017 CLINICAL DATA:  Hypoxia. Prescribed oxygen last week for low oxygen saturations. Increased lethargy and confusion over 2 weeks. Cough for 3 months. Shortness of breath. Former smoker. EXAM: CHEST  2 VIEW COMPARISON:  03/22/2017 FINDINGS: Mild cardiac  enlargement without pulmonary vascular congestion. Probable atelectasis in the left lung base. No consolidation or airspace disease. No blunting of costophrenic angles. No pneumothorax. Calcified and tortuous aorta. IMPRESSION: Cardiac enlargement. Atelectasis in the left base. No active disease. Electronically Signed   By: Lucienne Capers M.D.   On: 04/09/2017 21:12   Ct Head Wo Contrast  Result Date: 05/01/2017 CLINICAL DATA:  Altered mental status.  History of CVA and seizures. EXAM: CT HEAD WITHOUT CONTRAST TECHNIQUE: Contiguous axial images were obtained from the base of the skull through the vertex without intravenous contrast. COMPARISON:  CT head 04/30/2017 and 08/05/2014. FINDINGS: Brain: There is no evidence of acute intracranial hemorrhage, mass lesion, brain edema or extra-axial fluid collection. The ventricles and subarachnoid spaces are appropriately sized for age. There is no CT evidence of acute cortical infarction. Chronic small vessel ischemic changes are again noted within the periventricular and subcortical white matter with asymmetric involvement of the left basal ganglia. Vascular: Intracranial vascular calcifications. No hyperdense vessel identified. Skull: Negative for fracture or focal lesion. Sinuses/Orbits: Cerumen noted in both external auditory canals. The visualized paranasal sinuses and mastoid air cells are clear. No orbital abnormalities are seen. There are postsurgical changes in both globes. Other: None. IMPRESSION: No acute intracranial findings. Stable chronic small vessel ischemic changes. Electronically Signed   By: Richardean Sale M.D.   On: 05/01/2017 10:35   Ct Head Wo Contrast  Result Date: 04/30/2017 CLINICAL DATA:  Altered mental status. History of laryngeal cancer and lymphoma. Last treatment 5 years ago. EXAM: CT HEAD WITHOUT CONTRAST TECHNIQUE: Contiguous axial images were obtained from the base of the skull through the vertex without intravenous contrast.  COMPARISON:  08/05/2014 FINDINGS: Brain: Ventricles and cisterns are within normal. There is mild age related atrophic change. There is moderate chronic ischemic microvascular disease. Small old lacune infarct over the left periventricular white matter. No mass, mass effect, shift of midline structures or acute hemorrhage. No evidence of acute infarction. Vascular: No hyperdense vessel or unexpected calcification. Skull: Normal. Negative for fracture or focal lesion. Sinuses/Orbits: No acute finding. Other: None. IMPRESSION: No acute intracranial findings. Chronic ischemic microvascular disease and age related atrophic change. Small old lacunar infarct in the left periventricular white matter. Electronically Signed   By: Marin Olp M.D.   On: 04/30/2017 10:13    Microbiology: Recent Results (from the past 240 hour(s))  Urine culture     Status: Abnormal   Collection Time: 04/30/17 10:00 AM  Result Value Ref Range Status   Specimen Description URINE, CLEAN CATCH  Final   Special Requests NONE  Final   Culture >=100,000 COLONIES/mL KLEBSIELLA  PNEUMONIAE (A)  Final   Report Status 05/02/2017 FINAL  Final   Organism ID, Bacteria KLEBSIELLA PNEUMONIAE (A)  Final      Susceptibility   Klebsiella pneumoniae - MIC*    AMPICILLIN >=32 RESISTANT Resistant     CEFAZOLIN <=4 SENSITIVE Sensitive     CEFTRIAXONE <=1 SENSITIVE Sensitive     CIPROFLOXACIN <=0.25 SENSITIVE Sensitive     GENTAMICIN <=1 SENSITIVE Sensitive     IMIPENEM <=0.25 SENSITIVE Sensitive     NITROFURANTOIN <=16 SENSITIVE Sensitive     TRIMETH/SULFA <=20 SENSITIVE Sensitive     AMPICILLIN/SULBACTAM 8 SENSITIVE Sensitive     PIP/TAZO <=4 SENSITIVE Sensitive     Extended ESBL NEGATIVE Sensitive     * >=100,000 COLONIES/mL KLEBSIELLA PNEUMONIAE  Blood culture (routine x 2)     Status: None (Preliminary result)   Collection Time: 05/01/17 11:10 AM  Result Value Ref Range Status   Specimen Description BLOOD LEFT FOREARM  Final    Special Requests   Final    BOTTLES DRAWN AEROBIC AND ANAEROBIC Blood Culture adequate volume   Culture NO GROWTH 1 DAY  Final   Report Status PENDING  Incomplete  Blood culture (routine x 2)     Status: None (Preliminary result)   Collection Time: 05/01/17 11:10 AM  Result Value Ref Range Status   Specimen Description BLOOD RIGHT ANTECUBITAL  Final   Special Requests   Final    BOTTLES DRAWN AEROBIC AND ANAEROBIC Blood Culture results may not be optimal due to an excessive volume of blood received in culture bottles   Culture NO GROWTH 1 DAY  Final   Report Status PENDING  Incomplete     Labs: Basic Metabolic Panel:  Recent Labs Lab 04/30/17 0950 05/01/17 0915 05/03/17 0806  NA 141 136 139  K 3.7 3.7 4.0  CL 106 103 104  CO2 27 25 27   GLUCOSE 118* 119* 98  BUN 14 15 16   CREATININE 1.32* 1.43* 1.08*  CALCIUM 8.9 9.1 8.9   Liver Function Tests:  Recent Labs Lab 04/30/17 0950 05/01/17 0915  AST 12* 14*  ALT 9* 9*  ALKPHOS 71 58  BILITOT 1.3* 1.4*  PROT 7.3 6.6  ALBUMIN 3.7 3.7   No results for input(s): LIPASE, AMYLASE in the last 168 hours. No results for input(s): AMMONIA in the last 168 hours. CBC:  Recent Labs Lab 04/30/17 0950 05/01/17 0915 05/03/17 0806  WBC 6.3 6.0 5.9  NEUTROABS  --   --  3.9  HGB 11.9* 11.8* 11.6*  HCT 37.1 37.5 37.9  MCV 86.3 87.4 87.5  PLT 146* 132* 141*   Cardiac Enzymes: No results for input(s): CKTOTAL, CKMB, CKMBINDEX, TROPONINI in the last 168 hours. BNP: BNP (last 3 results) No results for input(s): BNP in the last 8760 hours.  ProBNP (last 3 results)  Recent Labs  03/22/17 1149  PROBNP 243.0*    CBG:  Recent Labs Lab 04/30/17 0950  GLUCAP 113*       Signed:  Nita Sells MD   [facility] 05/03/2017, 10:27 AM

## 2017-05-03 NOTE — Telephone Encounter (Signed)
Spoke with [name], only appt available was on Wedn morning 8:45am, scheduled

## 2017-05-03 NOTE — Telephone Encounter (Signed)
She has since been admitted to the hospital (currently admitted).  We should have her follow up within the next week.

## 2017-05-04 ENCOUNTER — Telehealth: Payer: Self-pay | Admitting: *Deleted

## 2017-05-04 NOTE — Telephone Encounter (Signed)
Transition Care Management Follow-up Telephone Call  Per Discharge Summary: Admit date: 05/01/2017 Discharge date: 05/03/2017   Recommendations for Outpatient Follow-up:  1. Please note the dosage change of Keppra from 500 to 1000 twice a day 2. New medications ciprofloxacin 1 tablet 2 times a day for 3 days 3. We'll cC her primary neurologist Dr. Tomi Likens regarding her care and admission 4. Recommend outpatient therapy as a follow-up 5. Patient is beyond the age of 64 and has been reports that she is having some dark stool at times and was noted to have hemorrhoids--I will defer to the collective decision of her primary physician to determine if the patient would benefit from a colonoscopy--she is not having melena and is having formed stools from her own account--anusol suppository was recommended on discharge/witch hazel/Tucks pad--would continue Xarelto regardless-hemoglobin has been stable in the 11-13 range  Discharge Diagnoses:  Active Problems:   Essential hypertension   Atrial fibrillation (HCC)   AKI (acute kidney injury) ([place])   Weakness generalized   CKD (chronic kidney disease), stage III ([place])   UTI (urinary tract infection)   Seizure ([place])   Seizures ([place])  --  Discharge Condition: improved  Diet recommendation: heart healthy low-salt   How have you been since you were released from the hospital? "Good, very good."   Do you understand why you were in the hospital? yes   Do you understand the discharge instructions? yes   Where were you discharged to? Home   Items Reviewed:  Medications reviewed: yes  Allergies reviewed: yes  Dietary changes reviewed: yes  Referrals reviewed: no, but patient is interested in seeing a nutritionist to help with meal planning   Functional Questionnaire:   Activities of Daily Living (ADLs):   She states they are independent in the following: ambulation, bathing and hygiene, feeding, continence, grooming, toileting  and dressing States they require assistance with the following: none   Any transportation issues/concerns?: no, family member/friend will drive   Any patient concerns? no   Confirmed importance and date/time of follow-up visits scheduled yes  Provider Appointment booked with Dr. [name] 05/06/17 @ 2:30pm. Reminded patient that appt will be at [place] office.  Confirmed with patient if condition begins to worsen call PCP or go to the ER.  Patient was given the office number and encouraged to call back with question or concerns.  : yes

## 2017-05-04 NOTE — Telephone Encounter (Signed)
noted thanks  

## 2017-05-05 ENCOUNTER — Encounter: Payer: Self-pay | Admitting: Neurology

## 2017-05-05 ENCOUNTER — Ambulatory Visit (INDEPENDENT_AMBULATORY_CARE_PROVIDER_SITE_OTHER): Payer: Medicare Other | Admitting: Neurology

## 2017-05-05 VITALS — BP 124/64 | HR 82 | Ht 62.0 in | Wt 189.0 lb

## 2017-05-05 DIAGNOSIS — Z634 Disappearance and death of family member: Secondary | ICD-10-CM | POA: Diagnosis not present

## 2017-05-05 DIAGNOSIS — G40109 Localization-related (focal) (partial) symptomatic epilepsy and epileptic syndromes with simple partial seizures, not intractable, without status epilepticus: Secondary | ICD-10-CM

## 2017-05-05 NOTE — Patient Instructions (Signed)
1.  Continue Keppra 1000mg  twice daily 2.  If you don't hear from home health, let me know and I will refer you for somebody to come to the house 3.  Follow up in January

## 2017-05-05 NOTE — Progress Notes (Signed)
NEUROLOGY FOLLOW UP OFFICE NOTE  [name] [id number]  HISTORY OF PRESENT ILLNESS: [name] is an 83 year old right-handed woman with history of hypertension, atrial fibrillation, peripheral vascular disease, anxiety, depression, B Cell lymphoma status post chemotherapy, and COPD who follows up for symptomatic seizure secondary to stroke.  She is accompanied by her husband and younger daughter who provides history.   UPDATE: About 2 months ago, her older daughter passed away.  She has been bereaving.  She had no longer been taking care of herself.  She hasn't been bathing, eating or drinking water.  She was admitted to [facility] from 05/01/17 to 05/03/17 for altered mental status and generalized weakness with 2 seizures over the weekend.  She was found by her daughter sitting in the dark moaning with foaming at the mouth and left sided shaking lasting a couple of minutes.  She was altered for much longer time.  CT of head from 04/30/17 and 05/01/17 were personally reviewed and revealed no acute changes. She was found to have a temperature of 100.3.  She was found to have Klebsiella UTI/cystitis on UA and culture.  She was loaded with Keppra and treated with Cipro  Keppra was increased from 500mg  twice daily to 1000mg  twice daily.  About a week prior, she reportedly had another brief episode of jibberish speech and right facial droop, lasting 2-3 minutes.   HISTORY: Seizure disorder: Since childhood, she has episodes of numbness in either of both hands and inability to get words out.  It lasts for about 2 minutes.  It should be known that she has had these episodes since childhood.  Sometimes it was associated with headache.  She had them about twice a year until young adulthood, when they resolved.  Last fall, she had several severe episodes and was started on Keppra at that time, for possible seizures.     Cardio-embolic stroke: She has had several episodes of confusion and  aphasia in October and November of 2014, associated with stroke and presumed seizure.  On 05/26/13, MRI of the brain was performed, which revealed an acute lacunar infarct in the left anterior corona radiata and lentiform nucleus.  CTA of the head revealed atherosclerotic changes in the cavernous carotid arteries without significant stenosis.  CTA of neck revealed focal 50% stenosis of mid right ICA.  She was switched from [name] to Eliquis.  She was readmitted to the hospital on 05/31/13 with recurrent aphasia.  She had trouble getting her words out and then couldn't speak at all.  Repeat MRI of brain revealed new punctate infarct in the left posterior corona radiata, the same location as previous infarct. No changes in management were made.  She was readmitted to the hospital on 06/07/13 for altered mental status.  EEG was performed, showing left fronto-parieto-temporal theta slowing.  Due to possibility of seizure, she was started on Keppra 250mg  BID.  No change made to Eliquis.  She presented to the ED on 06/16/13 after waking up in the morning with expressive aphasia and right-sided weakness.  Symptoms improved in the ED.  CT head revealed prior lacunar infarct in left lentiform nucleus and corona radiata, but no new abnormalities.  It was suspected that she may have had another seizure and her Keppra was increased to 500mg  BID.  She was admitted to the hospital again on 06/21/13 again for expressive aphasia.  MRI brain revealed 2 new punctate infarcts in the bilateral hemispheres at level of centrum semiovale.  Eliquis was changed to Xarelto. Keppra was increased to 750mg  BID.     She presented to the ED on 08/05/14 for recurrent episode of aphasia, lasting 1.5 hours.  This was more significant than the previous year.  She described it as knowing what she wanted to say but having trouble getting the words out.  She had trouble writing as well.  MRI of brain revealed no acute infarct.  She had an episode two  days prior, lasting 1 hour.  Carotid doppler revealed no hemodynamically significant ICA stenosis.  Lipid panel showed cholesterol of 170, triglycerides of 235, HDL 33 and LDL 90.   She takes Xarelto for secondary stroke prevention, Lipitor for hyperlipidemia, and Zebeta and Lasix for hypertension.   Lumbar stenosis:   Both epidural injections and gabapentin have been ineffective.  She has had worsening back pain with neurogenic claudication.  She is unable to ambulate without a walker.  She reportedly had a repeat MRI of the lumbar spine which showed progression of stenosis and degeneration.  She saw a neurosurgeon, Dr. Ronnald Ramp, who would like to perform surgery.  I did inform them that discontinuing Xarelto for surgery would increase risk for stroke.     Retinal migraines:  Of note, she has longstanding history of migraines, described as scotomas, fortification spectrum and flashing lights in the right eye.  There is no associated headache.  PAST MEDICAL HISTORY: Past Medical History:  Diagnosis Date  . ABDOMINAL INCISIONAL HERNIA 04/03/2010      . Anxiety   . Arthritis    "lots; hands" (05/26/2013)  . Blood transfusion    "w/1st miscarriage" (05/26/2013)  . COPD (chronic obstructive pulmonary disease) ([place])   . Coronary artery disease   . Depression   . Diverticulitis of colon with hemorrhage 07/24/2008  . DVT (deep venous thrombosis) ([place]) 1952   "after I had my first child" (05/26/2013)  . Exertional shortness of breath   . Family history of anesthesia complication    "sisters also get sick as a dog" (05/26/2013)  . GERD 05/03/2007      . GERD (gastroesophageal reflux disease)   . H/O hiatal hernia   . HCAP (healthcare-associated pneumonia) 06/26/2013  . Heart murmur    "when I was a child" (05/26/2013)  . History of TIAs   . Hyperlipidemia   . Hypertension   . 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 Lymphona, mantle cell,  inguinal region/lower limb ([place])    "large c-cell" (05/26/2013)  . OSTEOARTHRITIS 05/03/2007  . PEPTIC ULCER DISEASE 07/06/2008  . Pneumonia 2013   "once" (05/26/2013)  . PUD (peptic ulcer disease)   . PVD (peripheral vascular disease) ([place])   . Seizures ([place])   . Stroke ([place]) 05/26/2013   "very mild; affected my speech for a while" (05/26/2013)  . VARICOSE VEINS LOWER EXTREMITIES W/INFLAMMATION 01/15/2009    MEDICATIONS: Current Outpatient Prescriptions on File Prior to Visit  Medication Sig Dispense Refill  . acetaminophen (TYLENOL) 650 MG CR tablet Take 650 mg by mouth every 8 (eight) hours as needed for pain. Reported on 12/09/2015    . atorvastatin (LIPITOR) 20 MG tablet TAKE 1 TABLET BY MOUTH ONCE DAILY (Patient taking differently: TAKE 20mg  BY MOUTH ONCE DAILY) 30 tablet 5  . bisoprolol (ZEBETA) 5 MG tablet Take 1 tablet (5 mg total) by mouth daily. 30 tablet 5  . cholecalciferol (VITAMIN D) 1000 UNITS tablet Take 1,000 Units by mouth daily.    Marland Kitchen  ciprofloxacin (CIPRO) 500 MG tablet Take 1 tablet (500 mg total) by mouth 2 (two) times daily. 6 tablet 0  . Cyanocobalamin (VITAMIN B-12 PO) Take 1 tablet by mouth daily.    . famotidine (PEPCID) 20 MG tablet Take 20 mg by mouth at bedtime.    . furosemide (LASIX) 20 MG tablet Take 1 tablet (20 mg total) by mouth daily as needed for edema. (Patient not taking: Reported on 05/05/2017) 30 tablet 2  . gabapentin (NEURONTIN) 100 MG capsule TAKE ONE CAPSULE BY MOUTH THREE TIMES DAILY (Patient taking differently: TAKE 100mg  BY MOUTH IN THE EVENINIG) 270 capsule 1  . levETIRAcetam (KEPPRA) 1000 MG tablet Take 1 tablet (1,000 mg total) by mouth 2 (two) times daily. 60 tablet 0  . loperamide (IMODIUM) 2 MG capsule Take 2 mg by mouth daily as needed for diarrhea or loose stools.     . Tiotropium Bromide-Olodaterol (STIOLTO RESPIMAT) 2.5-2.5 MCG/ACT AERS Inhale 2 puffs into the lungs daily. (Patient not taking: Reported on 05/05/2017) 1 Inhaler 6  .  VENTOLIN HFA 108 (90 Base) MCG/ACT inhaler Inhale 2 puffs into the lungs every 6 (six) hours as needed for wheezing or shortness of breath.     Alveda Reasons 15 MG TABS tablet TAKE ONE TABLET BY MOUTH ONCE DAILY WITH  SUPPER (Patient taking differently: TAKE 15mg  BY MOUTH ONCE DAILY WITH  SUPPER) 90 tablet 3   No current facility-administered medications on file prior to visit.     ALLERGIES: Allergies  Allergen Reactions  . Ativan [Lorazepam] Other (See Comments)    Severe hallucinations   . Propoxyphene Hcl   . Codeine Itching  . Penicillins Rash  . Vancomycin Rash    FAMILY HISTORY: Family History  Problem Relation Age of Onset  . Colon cancer Sister        colon  . Hyperlipidemia Mother   . Stroke Mother   . Cancer Father        mesothelioma    SOCIAL HISTORY: Social History   Social History  . Marital status: Married    Spouse name: N/A  . Number of children: 6  . Years of education: N/A   Occupational History  . retired Retired   Social History Main Topics  . Smoking status: Former Smoker    Packs/day: 2.50    Years: 37.00    Types: Cigarettes    Quit date: 09/05/1984  . Smokeless tobacco: Never Used  . Alcohol use No  . Drug use: No  . Sexual activity: No   Other Topics Concern  . Not on file   Social History Narrative   Married (husband patient of Dr. Yong Channel). 6 children (lost 2 for total 8). 15 grandkids. 6 greatgrandchildren.    Lives with husband. Husband drives. Patient does not drive.       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     REVIEW OF SYSTEMS: Constitutional: No fevers, chills, or sweats, no generalized fatigue, change in appetite Eyes: No visual changes, double vision, eye pain Ear, nose and throat: No hearing loss, ear pain, nasal congestion, sore throat Cardiovascular: No chest pain, palpitations Respiratory:  No shortness of breath at rest or with exertion,  wheezes GastrointestinaI: No nausea, vomiting, diarrhea, abdominal pain, fecal incontinence Genitourinary:  No dysuria, urinary retention or frequency Musculoskeletal:  No neck pain, back pain Integumentary: No rash, pruritus, skin lesions Neurological: as above Psychiatric: No  depression, insomnia, anxiety Endocrine: No palpitations, fatigue, diaphoresis, mood swings, change in appetite, change in weight, increased thirst Hematologic/Lymphatic:  No purpura, petechiae. Allergic/Immunologic: no itchy/runny eyes, nasal congestion, recent allergic reactions, rashes  PHYSICAL EXAM: Vitals:   05/05/17 0910  BP: 124/64  Pulse: 82  SpO2: (!) 89%   General: No acute distress.  Patient appears well-groomed.   Head:  Normocephalic/atraumatic Eyes:  Fundi examined but not visualized Neck: supple, no paraspinal tenderness, full range of motion Heart:  Regular rate and rhythm Lungs:  Clear to auscultation bilaterally Back: No paraspinal tenderness Neurological Exam: alert and oriented to person, place, and time. Attention span and concentration intact, recent and remote memory intact, fund of knowledge intact.  Speech fluent and not dysarthric, language intact.  CN II-XII intact. Bulk and tone normal, muscle strength 5/5 throughout.  Finger to nose testing with postural and kinetic tremor but no dysmetria.  Unsteady wide-based stance.  Cautious gait.  IMPRESSION: Localization-related symptomatic epilepsy.  Breakthrough seizures likely provoked by underlying UTI and possibly emotional stressors.  PLAN: 1.  Continue Keppra 1000mg  twice daily 2.  Recommend home health assessment.  She will likely need PT, safety assessment and evaluation for appropriate aide (caregiver services).  Somebody is already supposed to contact them.  If they don't hear from anyone, they will contact me for referral 3.  Follow up in January as scheduled.  27 minutes spent face to face with patient, over 50% spent  discussing management.  Metta Clines, DO  CC: [name], MD

## 2017-05-05 NOTE — Telephone Encounter (Signed)
Left message to call back. x2

## 2017-05-06 ENCOUNTER — Ambulatory Visit (INDEPENDENT_AMBULATORY_CARE_PROVIDER_SITE_OTHER): Payer: Medicare Other | Admitting: Family Medicine

## 2017-05-06 ENCOUNTER — Encounter: Payer: Self-pay | Admitting: Family Medicine

## 2017-05-06 ENCOUNTER — Telehealth: Payer: Self-pay | Admitting: Internal Medicine

## 2017-05-06 VITALS — BP 100/58 | HR 81 | Temp 97.8°F | Ht 62.0 in | Wt 188.2 lb

## 2017-05-06 DIAGNOSIS — R569 Unspecified convulsions: Secondary | ICD-10-CM

## 2017-05-06 DIAGNOSIS — N183 Chronic kidney disease, stage 3 unspecified: Secondary | ICD-10-CM

## 2017-05-06 DIAGNOSIS — D696 Thrombocytopenia, unspecified: Secondary | ICD-10-CM | POA: Diagnosis not present

## 2017-05-06 DIAGNOSIS — R195 Other fecal abnormalities: Secondary | ICD-10-CM | POA: Diagnosis not present

## 2017-05-06 DIAGNOSIS — D649 Anemia, unspecified: Secondary | ICD-10-CM | POA: Diagnosis not present

## 2017-05-06 DIAGNOSIS — R531 Weakness: Secondary | ICD-10-CM

## 2017-05-06 DIAGNOSIS — Z87440 Personal history of urinary (tract) infections: Secondary | ICD-10-CM | POA: Diagnosis not present

## 2017-05-06 DIAGNOSIS — N3000 Acute cystitis without hematuria: Secondary | ICD-10-CM

## 2017-05-06 LAB — CULTURE, BLOOD (ROUTINE X 2)
CULTURE: NO GROWTH
CULTURE: NO GROWTH
Special Requests: ADEQUATE

## 2017-05-06 NOTE — Progress Notes (Signed)
Subjective:  [name] is a 83 y.o. year old very pleasant female patient who presents for transitional care management and hospital follow up for seizures, UTI, generalized weakness. Patient was hospitalized from 05/01/17 to 05/03/17. A TCM phone call was completed on 05/04/17. Medical complexity moderate   Patient and her family reports she felt weak for a couple weeks before hospitalization. Her fatigue and weakness worsened substantially in the few days before hospitalization. They report to me she had a low-grade temperature. She had a seizure the day before admission and had an emergency room visit where she was discharged back home. Also thought to have a urinary tract infection at that time and was given ceftriaxone and Keflex during initial ER visit  Patient had a chest x-ray at that time of admission. I personally reviewed this film and there was no obvious acute finding. Some asymmetry behind the heart on AP but not on the lateral film. Possible atelectasis at the left base-she has follow-up with pulmonology in about a month and they can discuss if repeat chest x-rays necessary at that time. She also had a CT of her head due to her delirium and there were no acute intracranial findings. She has had some chronic small vessel ischemic changes which were stable. I also independently reviewed the CT scan.  She returned on the day of admission with another seizure. She was postictal for at least 2 hours. Dysuria had worsened. The urine culture from the day prior was growing gram-negative rods and eventually grew out Klebsiella. Her urinary tract infection was treated with IV antibiotics and at time of discharge she was placed on ciprofloxacin 1 tablet twice a day for 3 days. She finished her last dose this morning. This bacteria was sensitive to all antibiotics tested except for ampicillin. She also had blood cultures which were negative.  At the time of the second ER trip she was loaded with  Keppra. She was hospitalized for 2 days and did not have recurrent seizures. Her daily Keppra was increased from 500 mg twice a day to 1000 mg twice a day. She has not had recurrent seizures since discharge. She saw Dr. Tomi Likens her primary neurologist yesterday who was in agreement with current plan.  Plan for generalized weakness  has been home health physical therapy. She asks my opinion on this and I strongly encouraged her to proceed forward  Patient also complained of dark stools to the hospitalist team. She apparently had some hemorrhoids. I reviewed with her today some pictures of melena and she states her stools are a darker brown but certainly not black or similar to pictures of melena. One of the questions of the hospitalist team was whether patient needed a repeat colonoscopy. I do not think this is necessary. In addition I discussed potentially doing a colonoscopy with patient and she fiercely declines this as does her family. She is on Xarelto but without melena doubt upper GI bleed.  Reviewed hospital laboratory work. GFR stable in 50s with calculated creatinine clearance at 37.Mild anemia at 11.6 -could've been due to blood draws. Mild thrombocytopenia Mild platelet low   See problem oriented charting as well ROS- no further seizures. Occasional cough. No fever or chills. No headaches or blurry vision.   Past Medical History-  Patient Active Problem List   Diagnosis Date Noted  . Diastolic CHF ([place]) 55/73/2202    Priority: High  . History of cardioembolic stroke 54/27/0623    Priority: High  . Spinal stenosis,  lumbar region, with neurogenic claudication 05/09/2014    Priority: High  . History of Focal seizure ([place]) 06/16/2013    Priority: High  . TIA (transient ischemic attack) 05/31/2013    Priority: High  . History of Large B-cell lymphoma ([place]) 02/18/2012    Priority: High  . Atrial fibrillation ([place]) 12/14/2008    Priority: High  . Bacteremia due to Streptococcus  11/21/2015    Priority: Medium  . Cellulitis of leg, right 11/20/2015    Priority: Medium  . Diverticulitis of colon 09/26/2014    Priority: Medium  . CKD (chronic kidney disease), stage III ([place]) 09/26/2014    Priority: Medium  . Laryngeal carcinoma ([place])     Priority: Medium  . Anxiety state 10/20/2008    Priority: Medium  . Depression 10/20/2008    Priority: Medium  . COPD GOLD II 03/26/2008    Priority: Medium  . Hyperlipidemia 03/18/2007    Priority: Medium  . Essential hypertension 03/18/2007    Priority: Medium  . Aortic atherosclerosis ([place]) 06/30/2016    Priority: Low  . Weakness generalized 06/30/2013    Priority: Low  . Bilateral hand pain 01/09/2013    Priority: Low  . Seizures ([place]) 05/02/2017  . UTI (urinary tract infection) 05/01/2017  . Seizure ([place]) 05/01/2017  . Chronic respiratory failure with hypoxia and hypercapnia ([place]) 03/22/2017  . AKI (acute kidney injury) ([place]) 05/31/2013  . Dyspnea on exertion 07/06/2008    Medications- reviewed and updated  A medical reconciliation was performed comparing current medicines to hospital discharge medications. Current Outpatient Prescriptions  Medication Sig Dispense Refill  . acetaminophen (TYLENOL) 650 MG CR tablet Take 650 mg by mouth every 8 (eight) hours as needed for pain. Reported on 12/09/2015    . atorvastatin (LIPITOR) 20 MG tablet TAKE 1 TABLET BY MOUTH ONCE DAILY (Patient taking differently: TAKE 20mg  BY MOUTH ONCE DAILY) 30 tablet 5  . bisoprolol (ZEBETA) 5 MG tablet Take 1 tablet (5 mg total) by mouth daily. 30 tablet 5  . cholecalciferol (VITAMIN D) 1000 UNITS tablet Take 1,000 Units by mouth daily.    . Cyanocobalamin (VITAMIN B-12 PO) Take 1 tablet by mouth daily.    . famotidine (PEPCID) 20 MG tablet Take 20 mg by mouth at bedtime.    . furosemide (LASIX) 20 MG tablet Take 1 tablet (20 mg total) by mouth daily as needed for edema. 30 tablet 2  . gabapentin (NEURONTIN) 100 MG capsule TAKE ONE  CAPSULE BY MOUTH THREE TIMES DAILY (Patient taking differently: TAKE 200mg  at Bedtime) 270 capsule 1  . levETIRAcetam (KEPPRA) 1000 MG tablet Take 1 tablet (1,000 mg total) by mouth 2 (two) times daily. 60 tablet 0  . loperamide (IMODIUM) 2 MG capsule Take 2 mg by mouth daily as needed for diarrhea or loose stools.     Marland Kitchen omeprazole (PRILOSEC) 20 MG capsule Take 20 mg by mouth daily.    . Tiotropium Bromide-Olodaterol (STIOLTO RESPIMAT) 2.5-2.5 MCG/ACT AERS Inhale 2 puffs into the lungs daily. 1 Inhaler 6  . XARELTO 15 MG TABS tablet TAKE ONE TABLET BY MOUTH ONCE DAILY WITH  SUPPER (Patient taking differently: TAKE 15mg  BY MOUTH ONCE DAILY WITH  SUPPER) 90 tablet 3   No current facility-administered medications for this visit.     Objective: BP (!) 100/58   Pulse 81   Temp 97.8 F (36.6 C) (Oral)   Ht 5\' 2"  (1.575 m)   Wt 188 lb 3.2 oz (85.4 kg)   SpO2 93%  BMI 34.42 kg/m  Gen: NAD, resting comfortably CV: RRR no murmurs rubs or gallops Lungs: CTAB no crackles, wheeze, rhonchi Abdomen:no suprapubic pain Ext: no edema Skin: warm, dry Neuro: walks with walker, mentation seems slowed- can be tangential at times.   Assessment/Plan:  Seizures- no recurrence after keppra load and increase to 1000mg  BID UTI- symptoms have resolved after finishing ciprofloxacin. UTI may have led to delirium  Generalized weakness- strongly encouraged her to follow through with HHPT Dark stools- does not sound like melena- discussed possible colonoscopy and patient and family decline due to age and co morbidities- I do not think it would be advisable regardless.  Mild anemia- likely due to blood draws. If were to dip much under 11 would consider stool cards and may affect decision on dark stools Mild thrombocytopenia- update CBC CKD III- stable, update bmp Chronic cough- improved- has follow up with Dr. Melvyn Novas. May need follow up x-ray  Future Appointments Date Time Provider [place]  06/14/2017  10:45 AM Tanda Rockers, MD [facility] None  08/13/2017 2:00 PM Pieter Partridge, DO [facility] None  12/24/2017 12:00 PM Ladell Pier, MD [facility] None   We did not plan a specific follow up. Prn only at present. Feels she has had large # of physician visits.   Orders Placed This Encounter  Procedures  . CBC    [place]  . Basic metabolic panel    [place]    Meds ordered this encounter  Medications  . omeprazole (PRILOSEC) 20 MG capsule    Sig: Take 20 mg by mouth daily.    Return precautions advised.  [name], MD

## 2017-05-06 NOTE — Patient Instructions (Signed)
Continue current medications- glad you are feeling better  Please stop by lab before you go

## 2017-05-06 NOTE — Telephone Encounter (Signed)
[name] returned phone call

## 2017-05-06 NOTE — Telephone Encounter (Signed)
LMTC x 1 for Molly 

## 2017-05-06 NOTE — Telephone Encounter (Signed)
I have called and lm with staff for [facility] to call back about this pt.

## 2017-05-07 LAB — BASIC METABOLIC PANEL
BUN: 17 mg/dL (ref 6–23)
CALCIUM: 9.5 mg/dL (ref 8.4–10.5)
CO2: 24 mEq/L (ref 19–32)
Chloride: 103 mEq/L (ref 96–112)
Creatinine, Ser: 1.55 mg/dL — ABNORMAL HIGH (ref 0.40–1.20)
GFR: 33.6 mL/min — AB (ref >60.00)
GLUCOSE: 104 mg/dL — AB (ref 70–99)
POTASSIUM: 3.9 meq/L (ref 3.5–5.1)
Sodium: 138 mEq/L (ref 135–145)

## 2017-05-07 LAB — CBC
HEMATOCRIT: 37.5 % (ref 36.0–46.0)
Hemoglobin: 11.9 g/dL — ABNORMAL LOW (ref 12.0–15.0)
MCHC: 31.6 g/dL (ref 30.0–36.0)
MCV: 87.7 fl (ref 78.0–100.0)
PLATELETS: 203 10*3/uL (ref 150.0–400.0)
RBC: 4.27 Mil/uL (ref 3.87–5.11)
RDW: 15.2 % (ref 11.5–15.5)
WBC: 6.5 10*3/uL (ref 4.0–10.5)

## 2017-05-07 NOTE — Telephone Encounter (Signed)
I have lmom for molly to call us back about this pt.

## 2017-05-07 NOTE — Telephone Encounter (Signed)
I have left another message for [name]---we need to speak with her when she calls back.

## 2017-05-07 NOTE — Telephone Encounter (Signed)
Spoke with [name], pt does not wish to participate in pulmonary rehab at this time.  Will close encounter.

## 2017-05-07 NOTE — Telephone Encounter (Signed)
[name] returned phone call

## 2017-05-17 ENCOUNTER — Encounter: Payer: Self-pay | Admitting: Adult Health

## 2017-05-17 NOTE — Telephone Encounter (Signed)
Patient husband called and is asking about a refill. Please see below message. Who needs to refill this medication. Please notify patient. Her contact number [phone]. Please advise. Thank you

## 2017-05-18 ENCOUNTER — Other Ambulatory Visit: Payer: Self-pay

## 2017-05-18 ENCOUNTER — Encounter: Payer: Self-pay | Admitting: Family Medicine

## 2017-05-18 ENCOUNTER — Other Ambulatory Visit: Payer: Self-pay | Admitting: Adult Health

## 2017-05-18 ENCOUNTER — Telehealth: Payer: Self-pay | Admitting: Family Medicine

## 2017-05-18 ENCOUNTER — Ambulatory Visit (INDEPENDENT_AMBULATORY_CARE_PROVIDER_SITE_OTHER): Payer: Medicare Other | Admitting: Family Medicine

## 2017-05-18 VITALS — BP 102/66 | HR 78 | Temp 97.9°F

## 2017-05-18 DIAGNOSIS — R509 Fever, unspecified: Secondary | ICD-10-CM | POA: Diagnosis not present

## 2017-05-18 DIAGNOSIS — R5383 Other fatigue: Secondary | ICD-10-CM

## 2017-05-18 LAB — POCT URINALYSIS DIPSTICK
Bilirubin, UA: NEGATIVE
Glucose, UA: NEGATIVE
Ketones, UA: NEGATIVE
Leukocytes, UA: NEGATIVE
NITRITE UA: NEGATIVE
PH UA: 6 (ref 5.0–8.0)
PROTEIN UA: NEGATIVE
RBC UA: NEGATIVE
Spec Grav, UA: 1.015 (ref 1.010–1.025)
UROBILINOGEN UA: 0.2 U/dL

## 2017-05-18 LAB — CBC WITH DIFFERENTIAL/PLATELET
BASOS ABS: 0.1 10*3/uL (ref 0.0–0.1)
Basophils Relative: 0.6 % (ref 0.0–3.0)
EOS ABS: 0.1 10*3/uL (ref 0.0–0.7)
Eosinophils Relative: 0.7 % (ref 0.0–5.0)
HCT: 37.4 % (ref 36.0–46.0)
Hemoglobin: 12 g/dL (ref 12.0–15.0)
LYMPHS ABS: 1.8 10*3/uL (ref 0.7–4.0)
LYMPHS PCT: 19.5 % (ref 12.0–46.0)
MCHC: 32 g/dL (ref 30.0–36.0)
MCV: 86.8 fl (ref 78.0–100.0)
Monocytes Absolute: 0.7 10*3/uL (ref 0.1–1.0)
Monocytes Relative: 7.3 % (ref 3.0–12.0)
NEUTROS ABS: 6.6 10*3/uL (ref 1.4–7.7)
NEUTROS PCT: 71.9 % (ref 43.0–77.0)
PLATELETS: 209 10*3/uL (ref 150.0–400.0)
RBC: 4.31 Mil/uL (ref 3.87–5.11)
RDW: 15.5 % (ref 11.5–15.5)
WBC: 9.1 10*3/uL (ref 4.0–10.5)

## 2017-05-18 LAB — COMPREHENSIVE METABOLIC PANEL
ALK PHOS: 81 U/L (ref 39–117)
ALT: 8 U/L (ref 0–35)
AST: 9 U/L (ref 0–37)
Albumin: 3.9 g/dL (ref 3.5–5.2)
BILIRUBIN TOTAL: 1 mg/dL (ref 0.2–1.2)
BUN: 14 mg/dL (ref 6–23)
CALCIUM: 9.7 mg/dL (ref 8.4–10.5)
CO2: 29 meq/L (ref 19–32)
CREATININE: 1.16 mg/dL (ref 0.40–1.20)
Chloride: 101 mEq/L (ref 96–112)
GFR: 46.95 mL/min — ABNORMAL LOW (ref >60.00)
GLUCOSE: 106 mg/dL — AB (ref 70–99)
Potassium: 4.3 mEq/L (ref 3.5–5.1)
Sodium: 137 mEq/L (ref 135–145)
TOTAL PROTEIN: 7.6 g/dL (ref 6.0–8.3)

## 2017-05-18 MED ORDER — CIPROFLOXACIN HCL 500 MG PO TABS: 500.0000 mg | ORAL_TABLET | Freq: Two times a day (BID) | ORAL | 0 refills | Status: DC

## 2017-05-18 MED ORDER — MIRABEGRON ER 25 MG PO TB24: 25.0000 mg | ORAL_TABLET | Freq: Every day | ORAL | 3 refills | Status: DC

## 2017-05-18 NOTE — Progress Notes (Signed)
Subjective:  KRISHAWNA STIEFEL is a 83 y.o. year old very pleasant female patient who presents for/with See problem oriented charting ROS- has had fever and fatigue. Cough actually slightly better. Denies polyuria or nocturia. When fever high, seems more confused and irritable  Past Medical History-  Patient Active Problem List   Diagnosis Date Noted  . Diastolic CHF ([place]) 29/52/8413    Priority: High  . History of cardioembolic stroke 24/40/1027    Priority: High  . Spinal stenosis, lumbar region, with neurogenic claudication 05/09/2014    Priority: High  . History of Focal seizure ([place]) 06/16/2013    Priority: High  . TIA (transient ischemic attack) 05/31/2013    Priority: High  . History of Large B-cell lymphoma ([place]) 02/18/2012    Priority: High  . Atrial fibrillation ([place]) 12/14/2008    Priority: High  . Bacteremia due to Streptococcus 11/21/2015    Priority: Medium  . Cellulitis of leg, right 11/20/2015    Priority: Medium  . Diverticulitis of colon 09/26/2014    Priority: Medium  . CKD (chronic kidney disease), stage III ([place]) 09/26/2014    Priority: Medium  . Laryngeal carcinoma ([place])     Priority: Medium  . Anxiety state 10/20/2008    Priority: Medium  . Depression 10/20/2008    Priority: Medium  . COPD GOLD II 03/26/2008    Priority: Medium  . Hyperlipidemia 03/18/2007    Priority: Medium  . Essential hypertension 03/18/2007    Priority: Medium  . Aortic atherosclerosis ([place]) 06/30/2016    Priority: Low  . Weakness generalized 06/30/2013    Priority: Low  . Bilateral hand pain 01/09/2013    Priority: Low  . Seizures ([place]) 05/02/2017  . UTI (urinary tract infection) 05/01/2017  . Seizure ([place]) 05/01/2017  . Chronic respiratory failure with hypoxia and hypercapnia ([place]) 03/22/2017  . AKI (acute kidney injury) ([place]) 05/31/2013  . Dyspnea on exertion 07/06/2008    Medications- reviewed and updated Current Outpatient Prescriptions  Medication Sig  Dispense Refill  . acetaminophen (TYLENOL) 650 MG CR tablet Take 650 mg by mouth every 8 (eight) hours as needed for pain. Reported on 12/09/2015    . atorvastatin (LIPITOR) 20 MG tablet TAKE 1 TABLET BY MOUTH ONCE DAILY (Patient taking differently: TAKE 20mg  BY MOUTH ONCE DAILY) 30 tablet 5  . bisoprolol (ZEBETA) 5 MG tablet Take 1 tablet (5 mg total) by mouth daily. 30 tablet 5  . cholecalciferol (VITAMIN D) 1000 UNITS tablet Take 1,000 Units by mouth daily.    . Cyanocobalamin (VITAMIN B-12 PO) Take 1 tablet by mouth daily.    . famotidine (PEPCID) 20 MG tablet Take 20 mg by mouth at bedtime.    . furosemide (LASIX) 20 MG tablet Take 1 tablet (20 mg total) by mouth daily as needed for edema. 30 tablet 2  . gabapentin (NEURONTIN) 100 MG capsule TAKE ONE CAPSULE BY MOUTH THREE TIMES DAILY (Patient taking differently: TAKE 200mg  at Bedtime) 270 capsule 1  . levETIRAcetam (KEPPRA) 1000 MG tablet Take 1 tablet (1,000 mg total) by mouth 2 (two) times daily. 60 tablet 0  . loperamide (IMODIUM) 2 MG capsule Take 2 mg by mouth daily as needed for diarrhea or loose stools.     . mirabegron ER (MYRBETRIQ) 25 MG TB24 tablet Take 1 tablet (25 mg total) by mouth daily. 90 tablet 3  . omeprazole (PRILOSEC) 20 MG capsule Take 20 mg by mouth daily.    . Tiotropium Bromide-Olodaterol (STIOLTO RESPIMAT) 2.5-2.5 MCG/ACT  AERS Inhale 2 puffs into the lungs daily. 1 Inhaler 6  . XARELTO 15 MG TABS tablet TAKE ONE TABLET BY MOUTH ONCE DAILY WITH  SUPPER (Patient taking differently: TAKE 15mg  BY MOUTH ONCE DAILY WITH  SUPPER) 90 tablet 3  . ciprofloxacin (CIPRO) 500 MG tablet Take 1 tablet (500 mg total) by mouth 2 (two) times daily. 20 tablet 0   No current facility-administered medications for this visit.     Objective: BP 102/66 (BP Location: Left Arm, Cuff Size: Large)   Pulse 78   Temp 97.9 F (36.6 C)   SpO2 94%  on 2 L [place]. Down into low 80s  Gen: NAD, appears fatigued, slight confusion at first- seems to  improve on oxygen CV: RRR no murmurs rubs or gallops Lungs: CTAB no crackles, wheeze, rhonchi Abdomen: soft/nontender other than mild suprapubic pain/nondistended/normal bowel sounds. No rebound or guarding.  Ext: trace edema Skin: warm, dry, no rash  Results for orders placed or performed in visit on 05/18/17 (from the past 24 hour(s))  Comprehensive metabolic panel     Status: Abnormal   Collection Time: 05/18/17  2:52 PM  Result Value Ref Range   Sodium 137 135 - 145 mEq/L   Potassium 4.3 3.5 - 5.1 mEq/L   Chloride 101 96 - 112 mEq/L   CO2 29 19 - 32 mEq/L   Glucose, Bld 106 (H) 70 - 99 mg/dL   BUN 14 6 - 23 mg/dL   Creatinine, Ser 1.16 0.40 - 1.20 mg/dL   Total Bilirubin 1.0 0.2 - 1.2 mg/dL   Alkaline Phosphatase 81 39 - 117 U/L   AST 9 0 - 37 U/L   ALT 8 0 - 35 U/L   Total Protein 7.6 6.0 - 8.3 g/dL   Albumin 3.9 3.5 - 5.2 g/dL   Calcium 9.7 8.4 - 10.5 mg/dL   GFR 46.95 (L) >60.00 mL/min  CBC with Differential/Platelet     Status: None   Collection Time: 05/18/17  2:52 PM  Result Value Ref Range   WBC 9.1 4.0 - 10.5 K/uL   RBC 4.31 3.87 - 5.11 Mil/uL   Hemoglobin 12.0 12.0 - 15.0 g/dL   HCT 37.4 36.0 - 46.0 %   MCV 86.8 78.0 - 100.0 fl   MCHC 32.0 30.0 - 36.0 g/dL   RDW 15.5 11.5 - 15.5 %   Platelets 209.0 150.0 - 400.0 K/uL   Neutrophils Relative % 71.9 43.0 - 77.0 %   Lymphocytes Relative 19.5 12.0 - 46.0 %   Monocytes Relative 7.3 3.0 - 12.0 %   Eosinophils Relative 0.7 0.0 - 5.0 %   Basophils Relative 0.6 0.0 - 3.0 %   Neutro Abs 6.6 1.4 - 7.7 K/uL   Lymphs Abs 1.8 0.7 - 4.0 K/uL   Monocytes Absolute 0.7 0.1 - 1.0 K/uL   Eosinophils Absolute 0.1 0.0 - 0.7 K/uL   Basophils Absolute 0.1 0.0 - 0.1 K/uL  POCT urinalysis dipstick     Status: Normal   Collection Time: 05/18/17  4:56 PM  Result Value Ref Range   Color, UA Yellow    Clarity, UA Clear    Glucose, UA Negative    Bilirubin, UA Negative    Ketones, UA Negative    Spec Grav, UA 1.015 1.010 - 1.025    Blood, UA Negative    pH, UA 6.0 5.0 - 8.0   Protein, UA Negative    Urobilinogen, UA 0.2 0.2 or 1.0 E.U./dL   Nitrite, UA  Negative    Leukocytes, UA Negative Negative   Assessment/Plan:  Fever, unspecified fever cause - Plan: Comprehensive metabolic panel, CBC with Differential/Platelet, Urine Culture, POCT urinalysis dipstick, Urine Culture, POCT urinalysis dipstick, CANCELED: Urine Culture, CANCELED: POCT urinalysis dipstick  Fatigue, unspecified type - Plan: Comprehensive metabolic panel, CBC with Differential/Platelet, Urine Culture, POCT urinalysis dipstick, Urine Culture, POCT urinalysis dipstick S: Patient has been very fatigued for last 2 days. Noted yesterday temperature into 99s then trended up to 100.5. Came back down with tylenol. Increased confusion and agitation with temperature elevations. Slight lower abdominal pain. Denies dysuria or polyuria. Has baseline cough but actually improving slightly. Supposed to wear oxygen at home but is not wearing. No increased shortness of breath. Baseline oxygen still in 88-90 on most good days but can dip lower. Today running in mid to low 80s and placed her on oxygen. If takes good deep breaths then can get up to 87 or 88. She had a very similar decline around the time of last UTI which led to hospitalization A/P: First UA positive for leukocytes but did not get great sample- we opted to start on ciprofloxacin 500mg  BID x 10 days at that point (with fever, suprapubic pain). Return UA was much clearer without obvious UTI. Initially there was concern for recurrent urosepsis with low BP, reported fever. REpeat BP was at her baseline though after she was placed on oxygen.  We were able to get urine at end of day but patient had to go home to obtain this. She was to start cipro only after culture obtaied.   Per family cough has improved and no increased shortness of breath. With oxygen level lower considered x-ray but lungs clear and initially thought  UTI likely higher on differential. We will reevaluate on Thursday. She appeared in better health/less confusion with oxygen on and has home oxygen so was encouraged to use. No clear COPD flare with no wheezing, increased ocugh or sputum production. Weight stbale and no increased edema- doubt CHF. High risk of hospitalization.   Considered holding bisoprolol but Bp essentially at baseline since hospital discharge for urosepsis within last month  Future Appointments Date Time Provider [place]  06/14/2017 10:45 AM Tanda Rockers, MD [facility] None  08/13/2017 2:00 PM Pieter Partridge, DO [facility] None  12/24/2017 12:00 PM Ladell Pier, MD [facility] None   Roselyn Reef to advise Thursday  Orders Placed This Encounter  Procedures  . Urine Culture    Standing Status:   Future    Number of Occurrences:   1    Standing Expiration Date:   05/18/2018  . Comprehensive metabolic panel    [place]  . CBC with Differential/Platelet  . POCT urinalysis dipstick    Standing Status:   Future    Number of Occurrences:   1    Standing Expiration Date:   08/18/2017   The duration of face-to-face time during this visit was greater than 45 minutes (patient in office from 1:00 PM to 2:54 PM- initially spent from 1 to 1:30 PM with patient with additional 15 minutes face to face counseling about plan. Greater than 50% of this time was spent in counseling, explanation of diagnosis, planning of further management, and/or coordination of care including  Extended period of time used to discuss importance o fgetting urine sample, my concern for her illness and ER precautions  Meds ordered this encounter  Medications  . ciprofloxacin (CIPRO) 500 MG tablet    Sig: Take 1 tablet (  500 mg total) by mouth 2 (two) times daily.    Dispense:  20 tablet    Refill:  0   Return precautions advised.  [name], MD

## 2017-05-18 NOTE — Patient Instructions (Signed)
Need urine culture before we start antibiotics and I want antibiotics started as soon as we are sure sample size is large enough  Start ciprofloxacin twice a day for 10 days given with how sick she has gotten in the passed.   If symptoms worsen- confusion or higher fever or shortness of breath needs to go to the hospital  Please make sure to use your oxygen at home

## 2017-05-18 NOTE — Telephone Encounter (Signed)
[place] at [place] Patient Name: [name] DOB: March 18, 1928 Initial Comment Caller states his wife has been running a fever. She seems to have a UTI again. Pt of Dr. Yong Channel. Nurse Assessment Nurse: Markus Daft, RN, Sherre Poot Date/Time (Eastern Time): 05/18/2017 9:16:48 AM Confirm and document reason for call. If symptomatic, describe symptoms. ---Caller states his wife has been running a fever last night. Given Tylenol for temp around 100.5 by forehead. She has had a cough for 3 months and seeing pulmonary MD for chronic cough which is better than it was and not as frequent. No runny/stuffy nose, rash, sore throat. Denies pain with urination, urgency/frequency. She seems to have a UTI again. Finished antibiotics for UTI recently, 2 wks ago. Does the patient have any new or worsening symptoms? ---Yes Will a triage be completed? ---Yes Related visit to physician within the last 2 weeks? ---No Does the PT have any chronic conditions? (i.e. diabetes, asthma, etc.) ---Yes List chronic conditions. ---wears O2 constant - day/night; COPD; HTN; CHF; UTI's - about 3-4 in last year. Is this a behavioral health or substance abuse call? ---No Guidelines Guideline Title Affirmed Question Affirmed Notes Fever [1] Intermittent fever > 100.0 F (37.8 C) AND [2] lasts > 3 weeks Final Disposition User See PCP When Office is Open (within 3 days) Markus Daft, Therapist, sports, Sherre Poot Comments Had flu shot 2 wks ago. Appt with Dr. Yong Channel made for today at 1 pm Caller Disagree/Comply Comply Caller Understands Yes PreDisposition Go to Urgent Care/Walk-In Clinic

## 2017-05-18 NOTE — Telephone Encounter (Signed)
FYI -- appointment with Dr. Yong Channel at 1:00pm today

## 2017-05-18 NOTE — Telephone Encounter (Signed)
Patient's husband called to advise that the patient had a fever and needed to see the doctor. The fever was 100.5 or over. I transferred the call to triage to advise due to the fever being a trigger word. Awaiting triage note.

## 2017-05-19 LAB — URINE CULTURE
MICRO NUMBER: 81153145
SPECIMEN QUALITY:: ADEQUATE

## 2017-05-20 ENCOUNTER — Ambulatory Visit (INDEPENDENT_AMBULATORY_CARE_PROVIDER_SITE_OTHER): Payer: Medicare Other

## 2017-05-20 ENCOUNTER — Encounter: Payer: Self-pay | Admitting: Family Medicine

## 2017-05-20 ENCOUNTER — Ambulatory Visit (INDEPENDENT_AMBULATORY_CARE_PROVIDER_SITE_OTHER): Payer: Medicare Other | Admitting: Family Medicine

## 2017-05-20 ENCOUNTER — Other Ambulatory Visit: Payer: Self-pay | Admitting: Family Medicine

## 2017-05-20 VITALS — BP 126/70 | HR 83 | Temp 97.5°F | Wt 187.6 lb

## 2017-05-20 DIAGNOSIS — R509 Fever, unspecified: Secondary | ICD-10-CM | POA: Diagnosis not present

## 2017-05-20 DIAGNOSIS — R059 Cough, unspecified: Secondary | ICD-10-CM

## 2017-05-20 DIAGNOSIS — R05 Cough: Secondary | ICD-10-CM

## 2017-05-20 LAB — POCT INFLUENZA A/B
Influenza A, POC: NEGATIVE
Influenza B, POC: NEGATIVE

## 2017-05-20 MED ORDER — LEVOFLOXACIN 250 MG PO TABS: ORAL_TABLET | ORAL | 0 refills | Status: DC

## 2017-05-20 NOTE — Progress Notes (Signed)
Conversion for 750 mg dose daily for cr cl 20-50.

## 2017-05-20 NOTE — Patient Instructions (Addendum)
X-ray today  Flu test negative  5 days of ciprofloxacin  Sounds like finally improving- if new or worsening symptoms- please seek care with [place] if during day or emergency room if after hours  If confusion rapidly worsens or does not improve with temperature going back down please seek care immediately.   Restart medications Dr. Melvyn Novas prescribed including inhaler  Use home oxygen until this has resolved  If still having fevers by Monday even if other symptoms stable- return to see [place]

## 2017-05-20 NOTE — Addendum Note (Signed)
Addended by: Juanda Chance on: 05/20/2017 01:47 PM   Modules accepted: Orders

## 2017-05-20 NOTE — Progress Notes (Signed)
Subjective:  [name] is a 83 y.o. year old very pleasant female patient who presents for/with See problem oriented charting ROS- still with fever last night but not today. Mild cough- not worsening. No increased shortness of breath from baseline. Stable trace edema. No wheezing   Past Medical History-  Patient Active Problem List   Diagnosis Date Noted  . Diastolic CHF ([place]) 62/70/3500    Priority: High  . History of cardioembolic stroke 93/81/8299    Priority: High  . Spinal stenosis, lumbar region, with neurogenic claudication 05/09/2014    Priority: High  . History of Focal seizure ([place]) 06/16/2013    Priority: High  . TIA (transient ischemic attack) 05/31/2013    Priority: High  . History of Large B-cell lymphoma ([place]) 02/18/2012    Priority: High  . Atrial fibrillation ([place]) 12/14/2008    Priority: High  . Bacteremia due to Streptococcus 11/21/2015    Priority: Medium  . Cellulitis of leg, right 11/20/2015    Priority: Medium  . Diverticulitis of colon 09/26/2014    Priority: Medium  . CKD (chronic kidney disease), stage III ([place]) 09/26/2014    Priority: Medium  . Laryngeal carcinoma ([place])     Priority: Medium  . Anxiety state 10/20/2008    Priority: Medium  . Depression 10/20/2008    Priority: Medium  . COPD GOLD II 03/26/2008    Priority: Medium  . Hyperlipidemia 03/18/2007    Priority: Medium  . Essential hypertension 03/18/2007    Priority: Medium  . Aortic atherosclerosis ([place]) 06/30/2016    Priority: Low  . Weakness generalized 06/30/2013    Priority: Low  . Bilateral hand pain 01/09/2013    Priority: Low  . Seizures ([place]) 05/02/2017  . UTI (urinary tract infection) 05/01/2017  . Seizure ([place]) 05/01/2017  . Chronic respiratory failure with hypoxia and hypercapnia ([place]) 03/22/2017  . AKI (acute kidney injury) ([place]) 05/31/2013  . Dyspnea on exertion 07/06/2008    Medications- reviewed and updated Current Outpatient Prescriptions  Medication  Sig Dispense Refill  . acetaminophen (TYLENOL) 650 MG CR tablet Take 650 mg by mouth every 8 (eight) hours as needed for pain. Reported on 12/09/2015    . atorvastatin (LIPITOR) 20 MG tablet TAKE 1 TABLET BY MOUTH ONCE DAILY (Patient taking differently: TAKE 20mg  BY MOUTH ONCE DAILY) 30 tablet 5  . bisoprolol (ZEBETA) 5 MG tablet Take 1 tablet (5 mg total) by mouth daily. 30 tablet 5  . cholecalciferol (VITAMIN D) 1000 UNITS tablet Take 1,000 Units by mouth daily.    . ciprofloxacin (CIPRO) 500 MG tablet Take 1 tablet (500 mg total) by mouth 2 (two) times daily. 20 tablet 0  . Cyanocobalamin (VITAMIN B-12 PO) Take 1 tablet by mouth daily.    . famotidine (PEPCID) 20 MG tablet Take 20 mg by mouth at bedtime.    . furosemide (LASIX) 20 MG tablet Take 1 tablet (20 mg total) by mouth daily as needed for edema. 30 tablet 2  . gabapentin (NEURONTIN) 100 MG capsule TAKE ONE CAPSULE BY MOUTH THREE TIMES DAILY (Patient taking differently: TAKE 200mg  at Bedtime) 270 capsule 1  . levETIRAcetam (KEPPRA) 1000 MG tablet Take 1 tablet (1,000 mg total) by mouth 2 (two) times daily. 60 tablet 0  . loperamide (IMODIUM) 2 MG capsule Take 2 mg by mouth daily as needed for diarrhea or loose stools.     . mirabegron ER (MYRBETRIQ) 25 MG TB24 tablet Take 1 tablet (25 mg total) by mouth daily.  90 tablet 3  . omeprazole (PRILOSEC) 20 MG capsule Take 20 mg by mouth daily.    . Tiotropium Bromide-Olodaterol (STIOLTO RESPIMAT) 2.5-2.5 MCG/ACT AERS Inhale 2 puffs into the lungs daily. 1 Inhaler 6  . XARELTO 15 MG TABS tablet TAKE ONE TABLET BY MOUTH ONCE DAILY WITH  SUPPER (Patient taking differently: TAKE 15mg  BY MOUTH ONCE DAILY WITH  SUPPER) 90 tablet 3   No current facility-administered medications for this visit.     Objective: BP 126/70   Pulse 83   Temp (!) 97.5 F (36.4 C) (Oral)   Wt 187 lb 9.6 oz (85.1 kg)   SpO2 93%   BMI 34.31 kg/m  o2 improved on 2l oxygen, 86% originally Gen: NAD, resting comfortably TM  normal, oropharynx normal CV: RRR no murmurs rubs or gallops Lungs: CTAB no crackles, wheeze, rhonchi Abdomen: soft/very mild tenderness with deep palpation/nondistended/normal bowel sounds. No rebound or guarding.  Ext: trace edema Skin: warm, dry Neuro: walks with walker  Assessment/Plan:  Cough - Plan: DG Chest 2 View  Fever, unspecified fever cause - Plan: DG Chest 2 View S: Patient has continued to have some confusion when temperature elevated. Seems fevers have finally broken. Up to 100.5 last night but has not had one yet today. Resolved with tylenol last night.   Other daughter with her today and states cough had gotten better and then worsened lately. Husband informs me ad not restarted inhalers  Urine culture was negative- multiple morphotype's only A/P: Fever unclear cause. Possible viral upper respiratory infection. No wheezing- no obvious increased sputum production- will not use prednisone at present.   Flu test today negative. Will get chest x-ray considering oxygen remains down at 86% (once again has home o2 but usually oxygn levels at least 88). Given improvement- will do 5 days of ciprofloxacin but not full 10 (family and patient hesitant to stop). Restart home inhalers.   Use home oxygen regularly until improved  Patient high risk hospitalization and family does not know when to see her. Also high risk for transition to nursing home. Discussed [facility]- declines. She is considering care connection but declines for now.   Patient Instructions  X-ray today  Flu test negative  Sounds like finally improving- if new or worsening symptoms- please seek care with [place] if during day or emergency room if after hours  If confusion rapidly worsens or does not improve with temperature going back down please seek care immediately.   Restart medications Dr. Melvyn Novas prescribed including inhaler  Use home oxygen until this has resolved  Future Appointments Date Time Provider  [place]  06/14/2017 10:45 AM Tanda Rockers, MD [facility] None  08/13/2017 2:00 PM Pieter Partridge, DO [facility] None  12/24/2017 12:00 PM Ladell Pier, MD [facility] None   If still having fevers by Monday even if other symptoms stable- return to see [place]  Orders Placed This Encounter  Procedures  . DG Chest 2 View    Standing Status:   Future    Standing Expiration Date:   07/20/2018    Order Specific Question:   Reason for Exam (SYMPTOM  OR DIAGNOSIS REQUIRED)    Answer:   cough, fever, rule out pneumonia.    Order Specific Question:   Preferred imaging location?    Answer:   [facility]   Return precautions advised.  [name], MD

## 2017-06-02 ENCOUNTER — Encounter: Payer: Self-pay | Admitting: Family Medicine

## 2017-06-02 ENCOUNTER — Ambulatory Visit (INDEPENDENT_AMBULATORY_CARE_PROVIDER_SITE_OTHER): Payer: Medicare Other | Admitting: Family Medicine

## 2017-06-02 VITALS — BP 130/68 | HR 70 | Temp 97.7°F | Ht 62.0 in | Wt 189.8 lb

## 2017-06-02 DIAGNOSIS — J181 Lobar pneumonia, unspecified organism: Secondary | ICD-10-CM

## 2017-06-02 DIAGNOSIS — M549 Dorsalgia, unspecified: Secondary | ICD-10-CM

## 2017-06-02 DIAGNOSIS — J189 Pneumonia, unspecified organism: Secondary | ICD-10-CM

## 2017-06-02 NOTE — Patient Instructions (Signed)
Appears you are doing much better  Pneumonia was on the right side so the left sided back pain does not mean the pneumonia is back  Lets have you schedule an x-ray here in 3 weeks to make sure everything cleared on x-ray- if not then we would probably get a Ct scan

## 2017-06-02 NOTE — Progress Notes (Signed)
Subjective:  [name] is a 83 y.o. year old very pleasant female patient who presents for/with See problem oriented charting ROS- no fever or chills. Still some lingering cough and congestion. No confusion   Past Medical History-  Patient Active Problem List   Diagnosis Date Noted  . Diastolic CHF ([place]) 85/88/5027    Priority: High  . History of cardioembolic stroke 74/07/8785    Priority: High  . Spinal stenosis, lumbar region, with neurogenic claudication 05/09/2014    Priority: High  . History of Focal seizure ([place]) 06/16/2013    Priority: High  . TIA (transient ischemic attack) 05/31/2013    Priority: High  . History of Large B-cell lymphoma ([place]) 02/18/2012    Priority: High  . Atrial fibrillation ([place]) 12/14/2008    Priority: High  . Bacteremia due to Streptococcus 11/21/2015    Priority: Medium  . Cellulitis of leg, right 11/20/2015    Priority: Medium  . Diverticulitis of colon 09/26/2014    Priority: Medium  . CKD (chronic kidney disease), stage III ([place]) 09/26/2014    Priority: Medium  . Laryngeal carcinoma ([place])     Priority: Medium  . Anxiety state 10/20/2008    Priority: Medium  . Depression 10/20/2008    Priority: Medium  . COPD GOLD II 03/26/2008    Priority: Medium  . Hyperlipidemia 03/18/2007    Priority: Medium  . Essential hypertension 03/18/2007    Priority: Medium  . Aortic atherosclerosis ([place]) 06/30/2016    Priority: Low  . Weakness generalized 06/30/2013    Priority: Low  . Bilateral hand pain 01/09/2013    Priority: Low  . Seizures ([place]) 05/02/2017  . UTI (urinary tract infection) 05/01/2017  . Seizure ([place]) 05/01/2017  . Chronic respiratory failure with hypoxia and hypercapnia ([place]) 03/22/2017  . AKI (acute kidney injury) ([place]) 05/31/2013  . Dyspnea on exertion 07/06/2008    Medications- reviewed and updated Current Outpatient Prescriptions  Medication Sig Dispense Refill  . acetaminophen (TYLENOL) 650 MG CR tablet Take 650  mg by mouth every 8 (eight) hours as needed for pain. Reported on 12/09/2015    . atorvastatin (LIPITOR) 20 MG tablet TAKE 1 TABLET BY MOUTH ONCE DAILY (Patient taking differently: TAKE 20mg  BY MOUTH ONCE DAILY) 30 tablet 5  . bisoprolol (ZEBETA) 5 MG tablet Take 1 tablet (5 mg total) by mouth daily. 30 tablet 5  . cholecalciferol (VITAMIN D) 1000 UNITS tablet Take 1,000 Units by mouth daily.    . Cyanocobalamin (VITAMIN B-12 PO) Take 1 tablet by mouth daily.    . famotidine (PEPCID) 20 MG tablet Take 20 mg by mouth at bedtime.    . furosemide (LASIX) 20 MG tablet Take 1 tablet (20 mg total) by mouth daily as needed for edema. 30 tablet 2  . gabapentin (NEURONTIN) 100 MG capsule TAKE ONE CAPSULE BY MOUTH THREE TIMES DAILY (Patient taking differently: TAKE 200mg  at Bedtime) 270 capsule 1  . levETIRAcetam (KEPPRA) 1000 MG tablet Take 1 tablet (1,000 mg total) by mouth 2 (two) times daily. 60 tablet 0  . loperamide (IMODIUM) 2 MG capsule Take 2 mg by mouth daily as needed for diarrhea or loose stools.     . mirabegron ER (MYRBETRIQ) 25 MG TB24 tablet Take 1 tablet (25 mg total) by mouth daily. 90 tablet 3  . omeprazole (PRILOSEC) 20 MG capsule Take 20 mg by mouth daily.    . Tiotropium Bromide-Olodaterol (STIOLTO RESPIMAT) 2.5-2.5 MCG/ACT AERS Inhale 2 puffs into the lungs daily.  1 Inhaler 6  . XARELTO 15 MG TABS tablet TAKE ONE TABLET BY MOUTH ONCE DAILY WITH  SUPPER (Patient taking differently: TAKE 15mg  BY MOUTH ONCE DAILY WITH  SUPPER) 90 tablet 3   No current facility-administered medications for this visit.     Objective: BP 130/68 (BP Location: Left Arm, Patient Position: Sitting, Cuff Size: Large)   Pulse 70   Temp 97.7 F (36.5 C) (Oral)   Ht 5\' 2"  (1.575 m)   Wt 189 lb 12.8 oz (86.1 kg)   SpO2 91%   BMI 34.71 kg/m  Gen: NAD, resting comfortably, not wearing oxygen today, appears more alert CV: RRR no murmurs rubs or gallops Lungs: CTAB no crackles, wheeze, rhonchi Abdomen:  soft/nontender/nondistended/normal bowel sounds.  Ext: trace edema Skin: warm, dry  Assessment/Plan:  Community acquired pneumonia of right lower lobe of lung ([place]) - Plan: DG Chest 2 View S: Patient was being treated for UTI starting 05/18/17 based on fever, fatigue, concerning UA in patient with recent urosepsis. We also did x-ray with lower o2 sats then typical which showed RLL pneumonia. She was changed from ciprofloxacin to levaquin. She returns today as still has some cough and now has bck pain and was worried about pneumonia. Pain in Left mid back laterally but also goes towards center of back- worse with cough. She has been afebrile, less fatigued and with no confusion reported A/P: Patient with improved vital signs now at 91% oxygen with no supplemental o2, no fever, slightly improved cough- has chronic cough at baseline. I believe pneumonia has cleared. In regards to back pain- likely spasm associated with cough- its on opposite side of pneumonia and this reassured patient. She would still like to make sure pna has cleared so we scheduled x-ray in 3 weeks.   Of note listed as CAP but has had recent hospitalization so could be HCAP but given improvement- continue with plan above. Has chronic cough follow upin 13 days  Future Appointments Date Time Provider [place]  06/14/2017 10:45 AM Tanda Rockers, MD [facility] None  08/13/2017 2:00 PM Pieter Partridge, DO [facility] None  12/24/2017 12:00 PM Ladell Pier, MD [facility] None    Orders Placed This Encounter  Procedures  . DG Chest 2 View    Standing Status:   Future    Standing Expiration Date:   08/02/2018    Order Specific Question:   Reason for Exam (SYMPTOM  OR DIAGNOSIS REQUIRED)    Answer:   pneumonia follow up to look for clearance- 5 weeks out from diagnosis    Order Specific Question:   Preferred imaging location?    Answer:   [place]   Return precautions advised. PRN only for this acute  issue [name], MD

## 2017-06-04 ENCOUNTER — Other Ambulatory Visit: Payer: Self-pay | Admitting: Neurology

## 2017-06-04 NOTE — Telephone Encounter (Signed)
Rx called in 

## 2017-06-04 NOTE — Telephone Encounter (Signed)
Patient's husband called to request a refill on the Keppra medication. They use [facility] on [address]. Thanks

## 2017-06-14 ENCOUNTER — Ambulatory Visit (INDEPENDENT_AMBULATORY_CARE_PROVIDER_SITE_OTHER): Payer: Medicare Other | Admitting: Internal Medicine

## 2017-06-14 ENCOUNTER — Encounter: Payer: Self-pay | Admitting: Internal Medicine

## 2017-06-14 ENCOUNTER — Ambulatory Visit (INDEPENDENT_AMBULATORY_CARE_PROVIDER_SITE_OTHER)
Admission: RE | Admit: 2017-06-14 | Discharge: 2017-06-14 | Disposition: A | Discharge status: Home / Self Care | Payer: Medicare Other | Source: Ambulatory Visit | Attending: Internal Medicine | Admitting: Internal Medicine

## 2017-06-14 VITALS — BP 114/60 | HR 83 | Ht 62.0 in | Wt 189.0 lb

## 2017-06-14 DIAGNOSIS — J9611 Chronic respiratory failure with hypoxia: Secondary | ICD-10-CM

## 2017-06-14 DIAGNOSIS — J449 Chronic obstructive pulmonary disease, unspecified: Secondary | ICD-10-CM

## 2017-06-14 MED ORDER — FAMOTIDINE 20 MG PO TABS: 20.0000 mg | ORAL_TABLET | Freq: Every day | ORAL | Status: DC

## 2017-06-14 NOTE — Patient Instructions (Addendum)
Goal for 02 is to keep above 90% during the day and go ahead and use it at bedtime automatically   Please remember to go to the  x-ray department downstairs in the basement  for your tests - we will call you with the results when they are available.      Add pepcid 20 mg at bedtime to see if helps with the cough - take it for at least a month to see if helps   GERD (REFLUX)  is an extremely common cause of respiratory symptoms just like yours , many times with no obvious heartburn at all.    It can be treated with medication, but also with lifestyle changes including elevation of the head of your bed (ideally with 6 inch  bed blocks),  Smoking cessation, avoidance of late meals, excessive alcohol, and avoid fatty foods, chocolate, peppermint, colas, red wine, and acidic juices such as orange juice.  NO MINT OR MENTHOL PRODUCTS SO NO COUGH DROPS   USE SUGARLESS CANDY INSTEAD (Jolley ranchers or Stover's or Life Savers) or even ice chips will also do - the key is to swallow to prevent all throat clearing. NO OIL BASED VITAMINS - use powdered substitutes.     If you are satisfied with your treatment plan,  let your doctor know and he/she can either refill your medications or you can return here when your prescription runs out.     If in any way you are not 100% satisfied,  please tell us.  If 100% better, tell your friends!  Pulmonary follow up is as needed

## 2017-06-14 NOTE — Progress Notes (Signed)
Subjective:     Patient ID: [name], female   DOB: [birth year],     MRN: [id number]    Brief patient profile:  64 yowf stopped smoking due to larynx ca 1986 s/p RT with gruff voice ever since  worse sob/cough since May 2018 so referred to pulmonary clinic 03/22/2017 by Dr   Yong Channel     History of Present Illness  03/22/2017 1st [facility] office visit/ [name]   Chief Complaint  Patient presents with  . Pulmonary Consult    Referred by Dr. [name]. Pt c/o cough for 3 months- prod at times with yellow sputum.  She states cough is esp worse after eating and at night, sometimes waking her up. She states she gets choked every time she eats or drinks something. She states that she gets SOB when she walks up stairs or an incline.    prior to deterioration  In May 2018 baseline Chronic doe = MMRC2 = can't walk a nl pace on a flat grade s sob but does fine slow and flat eg HT hanging on cart  Flare of sob assoc with dysphagia/ breathing  does better when swallowing better  No better with inhalers but use is extremely limited  Breathing/ coughing better on prednisone than off  Cough is more dry than wet, more day than noct  rec Prednisone 10 mg take  4 each am x 2 days,   2 each am x 2 days,  1 each am x 2 days and stop  02 2lpm 24/7 for now  Stiolto 2 pffs each am  Pantoprazole (protonix) 40 mg   Take  30-60 min before first meal of the day and Pepcid (famotidine)  20 mg one @  bedtime until return to office - this is the best way to tell whether stomach acid is contributing to your problem.   GERD diet   04/13/17  NP eval  rec Hold Fish all and flaxseed all Begin Prilosec 20 mg daily before a meal. This is over-the-counter. Begin Pepcid 20 mg at bedtime. This is over-the-counter. Begin Mucinex twice daily as needed for cough and congestion Follow med calendar closely and bring to each visit. Continue on Stiolto 2 puffs daily .  Continue on Oxygen 2 L Follow with Dr. Melvyn Novas  in 2 months and as needed     06/14/2017  f/u ov/[name] re: copd gold II/ did not bring med calendar/ no longer using stiolto apparently no worse off it Chief Complaint  Patient presents with  . Follow-up    Her voice seems more hoarse. She states her breathing is some better.   some cough at hs  Not taking h2 as rec Using 2lpm hs and prn  Very inactive and really Not limited by breathing from desired activities     No obvious day to day or daytime variability or assoc excess/ purulent sputum or mucus plugs or hemoptysis or cp or chest tightness, subjective wheeze or overt sinus or hb symptoms. No unusual exp hx or h/o childhood pna/ asthma or knowledge of premature birth.    Also denies any obvious fluctuation of symptoms with weather or environmental changes or other aggravating or alleviating factors except as outlined above   Current Allergies, Complete Past Medical History, Past Surgical History, Family History, and Social History were reviewed in [employer] record.  ROS  The following are not active complaints unless bolded Hoarseness, sore throat, dysphagia, dental problems, itching, sneezing,  nasal  congestion  or discharge of excess mucus or purulent secretions, ear ache,   fever, chills, sweats, unintended wt loss or wt gain, classically pleuritic or exertional cp,  orthopnea pnd or leg swelling, presyncope, palpitations, abdominal pain, anorexia, nausea, vomiting, diarrhea  or change in bowel habits or change in bladder habits, change in stools or change in urine, dysuria, hematuria,  rash, arthralgias, visual complaints, headache, numbness, weakness or ataxia or problems with walking or coordination,  change in mood/affect or memory.        Current Meds  Medication Sig  . acetaminophen (TYLENOL) 650 MG CR tablet Take 650 mg by mouth every 8 (eight) hours as needed for pain. Reported on 12/09/2015  . atorvastatin (LIPITOR) 20 MG tablet TAKE 1 TABLET BY MOUTH  ONCE DAILY (Patient taking differently: TAKE 20mg  BY MOUTH ONCE DAILY)  . bisoprolol (ZEBETA) 5 MG tablet Take 1 tablet (5 mg total) by mouth daily.  . cholecalciferol (VITAMIN D) 1000 UNITS tablet Take 1,000 Units by mouth daily.  . Cyanocobalamin (VITAMIN B-12 PO) Take 1 tablet by mouth daily.  . furosemide (LASIX) 20 MG tablet Take 1 tablet (20 mg total) by mouth daily as needed for edema.  . gabapentin (NEURONTIN) 100 MG capsule TAKE ONE CAPSULE BY MOUTH THREE TIMES DAILY (Patient taking differently: TAKE 200mg  at Bedtime)  . levETIRAcetam (KEPPRA) 1000 MG tablet Take 1 tablet (1,000 mg total) by mouth 2 (two) times daily.  Marland Kitchen loperamide (IMODIUM) 2 MG capsule Take 2 mg by mouth daily as needed for diarrhea or loose stools.   Marland Kitchen omeprazole (PRILOSEC) 20 MG capsule Take 20 mg by mouth daily.  . OXYGEN 2lpm with sleep and occ during the day  . XARELTO 15 MG TABS tablet TAKE ONE TABLET BY MOUTH ONCE DAILY WITH  SUPPER (Patient taking differently: TAKE 15mg  BY MOUTH ONCE DAILY WITH  SUPPER)               Objective:   Physical Exam    Elderly wf nad / gruff voice = baseline / easily confused with details of care/ names of meds   06/14/2017      189   03/22/17 194 lb (88 kg)  01/12/17 197 lb (89.4 kg)  12/31/16 197 lb 3.2 oz (89.4 kg)    Vital signs reviewed - - Note on arrival 02 sats  95% RA       HEENT: nl  turbinates bilaterally, and oropharynx. Nl external ear canals without cough reflex - edentulous    NECK :  without JVD/Nodes/TM/ nl carotid upstrokes bilaterally   LUNGS: no acc muscle use,  Nl contour chest with mostly transmittted upper airway noises    CV:  RRR  no s3 or murmur or increase in P2, and no edema   ABD:  soft and nontender with nl inspiratory excursion in the supine position. No bruits or organomegaly appreciated, bowel sounds nl  MS:  Nl gait/ ext warm without deformities, calf tenderness, cyanosis or clubbing No obvious joint restrictions   SKIN:  warm and dry without lesions    NEURO:  alert, approp, nl sensorium with  no motor or cerebellar deficits apparent.          CXR PA and Lateral:   06/14/2017 :    I personally reviewed images and agree with radiology impression as follows:     1. COPD. 2. Resolution of right lower lobe pneumonia.       Assessment:

## 2017-06-15 ENCOUNTER — Encounter: Payer: Self-pay | Admitting: Internal Medicine

## 2017-06-15 DIAGNOSIS — J9611 Chronic respiratory failure with hypoxia: Secondary | ICD-10-CM

## 2017-06-15 DIAGNOSIS — Z8709 Personal history of other diseases of the respiratory system: Secondary | ICD-10-CM | POA: Insufficient documentation

## 2017-06-15 NOTE — Assessment & Plan Note (Signed)
Qualifying sats  03/22/2017 = 85% RA at rest/ 95 % on 2lpm  - sats at rest RA = 95% 06/14/2017   As of 06/14/2017 rec 2lpm hs and prn daytime with goal > 90% sats by self monitor

## 2017-06-15 NOTE — Assessment & Plan Note (Signed)
Spirometry 03/22/2017  FEV1 0.81 (52%)  Ratio 64 with typical curvature off all rx  - 03/22/2017  After extensive coaching HFA effectiveness =    50% from 0 baseline > try stiolto     I had an extended final summary discussion with the patient reviewing all relevant studies completed to date and  lasting 15 to 20 minutes of a 25 minute visit on the following issues:        As I explained to this patient in detail:  although there is  copd present, it may not be clinically relevant:   it does not appear to be limiting activity tolerance any more than a set of worn tires limits someone from driving a car  around a parking lot.  A new set of Michelins might look good but would have no perceived impact on the performance of the car and would not be worth the cost.  That is to say:   this pt is so sedentary I don't recommend aggressive pulmonary rx at this point unless limiting symptoms arise or acute exacerbations become as issue, neither of which is the case now.  I asked the patient to contact this office at any time in the future should either of these problems arise.     Pulmonary f/u is prn

## 2017-06-15 NOTE — Progress Notes (Signed)
LMTCB

## 2017-06-15 NOTE — Progress Notes (Signed)
Spoke with pt and notified of results per Dr. Wert. Pt verbalized understanding and denied any questions. 

## 2017-06-16 ENCOUNTER — Other Ambulatory Visit: Payer: Self-pay

## 2017-06-16 MED ORDER — LEVETIRACETAM 1000 MG PO TABS: 1000.0000 mg | ORAL_TABLET | Freq: Two times a day (BID) | ORAL | 2 refills | Status: DC

## 2017-07-23 ENCOUNTER — Other Ambulatory Visit: Payer: Self-pay

## 2017-07-23 ENCOUNTER — Telehealth: Payer: Self-pay | Admitting: Internal Medicine

## 2017-07-23 ENCOUNTER — Other Ambulatory Visit: Payer: Self-pay | Admitting: Neurology

## 2017-07-23 DIAGNOSIS — J449 Chronic obstructive pulmonary disease, unspecified: Secondary | ICD-10-CM

## 2017-07-23 NOTE — Telephone Encounter (Signed)
Spoke with pt and advised message from [facility]. She states she understands but her oxygen levels are between 90-92 when she checks her oxygen at home. She does not want to keep her appt but I did advise her to call us if she wanted to see MW about this if she fell into trouble. She doesn't want the oxygen and would like for the DME company to pick it up.   Order placed to discontinue oxygen. Will route to PCP

## 2017-07-23 NOTE — Telephone Encounter (Signed)
Spoke with Bethanne Ginger at [facility], states that pt's husband called [employer] today requesting that they pick up all O2 equipment.  Pt's spouse states that this was d/t pt not seeing MW any more and no longer needing O2.  Pt was seen last month and advised to continue wearing O2, and is to follow up next month with MW.    [employer] needs an order from our office in order to pick up O2.   MW please advise.  Thanks.

## 2017-07-23 NOTE — Telephone Encounter (Signed)
[name]- can you call them? She on a regular basis appears to have low oxygen in office and if this is Dr. Gustavus Bryant advice then it needs to be followed. In fact when her oxygen is low she does not appear to think as clearly and this is likely affecting her decision making.

## 2017-07-23 NOTE — Telephone Encounter (Signed)
Would strongly prefer then they follow my instructions but if they decline then document that and let the PCP know she's refusing my recs and I'll leave it to pt whether to keep her appt and address it again then or seek another source of care

## 2017-07-30 NOTE — Telephone Encounter (Signed)
Called patient and encouraged her to continue to follow Dr. Gustavus Bryant recommendations regarding oxygen. She states that she did a follow up visit and was told she didn't need a follow up. She wants to schedule an appointment with Dr. Yong Channel. Patient is noted to be out of breath on phone. Patient states she is taking her bed apart and cleaning doing a lot of bending and lifting.

## 2017-07-30 NOTE — Telephone Encounter (Signed)
We can check her oxygen at that time at least. thanks

## 2017-07-30 NOTE — Telephone Encounter (Signed)
I did schedule patient with Dr. Yong Channel for August 31, 2017 but this was for digestive issues. She is stating she hasn't used the oxygen in a month.

## 2017-08-09 ENCOUNTER — Ambulatory Visit: Payer: Self-pay | Admitting: *Deleted

## 2017-08-09 NOTE — Telephone Encounter (Addendum)
Same day appointment made with Dr. Yong Channel. Reason for Disposition . All other urine symptoms  Answer Assessment - Initial Assessment Questions [name] was treated for UTI last month by Dr. Yong Channel. She feels this is the same. Offered her appointments with other [place] physicians but she refused all doctors except Dr. Elease Hashimoto and Dr. Sarajane Jews.   1. SYMPTOM: "What's the main symptom you're concerned about?" (e.g., frequency, incontinence)     Pain with voiding  2. ONSET: "When did the  ________  start?"     Several days ago 3. PAIN: "Is there any pain?" If so, ask: "How bad is it?" (Scale: 1-10; mild, moderate, severe)  It just hurts when I try to void. 4. CAUSE: "What do you think is causing the symptoms?"    Had UTI about a month ago, feels the same 5. OTHER SYMPTOMS: "Do you have any other symptoms?" (e.g., fever, flank pain, blood in urine, pain with urination)     Blood on tissue sometimes 6. PREGNANCY: "Is there any chance you are pregnant?" "When was your last menstrual period?"    na  Protocols used: URINARY [facility]

## 2017-08-10 ENCOUNTER — Encounter: Payer: Self-pay | Admitting: Family Medicine

## 2017-08-10 ENCOUNTER — Ambulatory Visit: Payer: Medicare Other | Admitting: Family Medicine

## 2017-08-10 VITALS — BP 150/84 | HR 76 | Temp 97.4°F | Ht 62.0 in | Wt 188.6 lb

## 2017-08-10 DIAGNOSIS — N3000 Acute cystitis without hematuria: Secondary | ICD-10-CM

## 2017-08-10 DIAGNOSIS — R309 Painful micturition, unspecified: Secondary | ICD-10-CM

## 2017-08-10 LAB — POC URINALSYSI DIPSTICK (AUTOMATED)
BILIRUBIN UA: NEGATIVE
Blood, UA: NEGATIVE
GLUCOSE UA: NEGATIVE
KETONES UA: NEGATIVE
Nitrite, UA: NEGATIVE
Protein, UA: NEGATIVE
SPEC GRAV UA: 1.015 (ref 1.010–1.025)
Urobilinogen, UA: 0.2 E.U./dL
pH, UA: 6 (ref 5.0–8.0)

## 2017-08-10 MED ORDER — CIPROFLOXACIN HCL 250 MG PO TABS: 250.0000 mg | ORAL_TABLET | Freq: Two times a day (BID) | ORAL | 0 refills | Status: DC

## 2017-08-10 NOTE — Patient Instructions (Addendum)
Treat with 7 days of ciprofloxacin. Follow up if you have new or worsening symptoms or if symptoms fail to improve on this. Should have culture data back on urine by Thursday or Friday

## 2017-08-10 NOTE — Progress Notes (Signed)
Subjective:  [name] is a 84 y.o. year old very pleasant female patient who presents for/with See problem oriented charting ROS- no fever or chills. Has had dysuria and polyuria as well as trouble starting stream.    Past Medical History-  Patient Active Problem List   Diagnosis Date Noted  . Diastolic CHF ([place]) 40/98/1191    Priority: High  . History of cardioembolic stroke 47/82/9562    Priority: High  . Spinal stenosis, lumbar region, with neurogenic claudication 05/09/2014    Priority: High  . History of Focal seizure ([place]) 06/16/2013    Priority: High  . TIA (transient ischemic attack) 05/31/2013    Priority: High  . History of Large B-cell lymphoma ([place]) 02/18/2012    Priority: High  . Atrial fibrillation ([place]) 12/14/2008    Priority: High  . Bacteremia due to Streptococcus 11/21/2015    Priority: Medium  . Cellulitis of leg, right 11/20/2015    Priority: Medium  . Diverticulitis of colon 09/26/2014    Priority: Medium  . CKD (chronic kidney disease), stage III ([place]) 09/26/2014    Priority: Medium  . Laryngeal carcinoma ([place])     Priority: Medium  . Anxiety state 10/20/2008    Priority: Medium  . Depression 10/20/2008    Priority: Medium  . COPD GOLD II 03/26/2008    Priority: Medium  . Hyperlipidemia 03/18/2007    Priority: Medium  . Essential hypertension 03/18/2007    Priority: Medium  . Aortic atherosclerosis ([place]) 06/30/2016    Priority: Low  . Weakness generalized 06/30/2013    Priority: Low  . Bilateral hand pain 01/09/2013    Priority: Low  . Chronic respiratory failure with hypoxia ([place]) 06/15/2017  . Seizures ([place]) 05/02/2017  . UTI (urinary tract infection) 05/01/2017  . Seizure ([place]) 05/01/2017  . Chronic respiratory failure with hypoxia and hypercapnia ([place]) 03/22/2017  . AKI (acute kidney injury) ([place]) 05/31/2013  . Dyspnea on exertion 07/06/2008    Medications- reviewed and updated Current Outpatient Medications  Medication Sig  Dispense Refill  . acetaminophen (TYLENOL) 650 MG CR tablet Take 650 mg by mouth every 8 (eight) hours as needed for pain. Reported on 12/09/2015    . atorvastatin (LIPITOR) 20 MG tablet TAKE 1 TABLET BY MOUTH ONCE DAILY (Patient taking differently: TAKE 20mg  BY MOUTH ONCE DAILY) 30 tablet 5  . bisoprolol (ZEBETA) 5 MG tablet Take 1 tablet (5 mg total) by mouth daily. 30 tablet 5  . cholecalciferol (VITAMIN D) 1000 UNITS tablet Take 1,000 Units by mouth daily.    . Cyanocobalamin (VITAMIN B-12 PO) Take 1 tablet by mouth daily.    . famotidine (PEPCID) 20 MG tablet Take 1 tablet (20 mg total) at bedtime by mouth.    . furosemide (LASIX) 20 MG tablet Take 1 tablet (20 mg total) by mouth daily as needed for edema. 30 tablet 2  . gabapentin (NEURONTIN) 100 MG capsule TAKE 1 CAPSULE BY MOUTH THREE TIMES DAILY 270 capsule 1  . levETIRAcetam (KEPPRA) 1000 MG tablet Take 1 tablet (1,000 mg total) by mouth 2 (two) times daily. 60 tablet 0  . levETIRAcetam (KEPPRA) 1000 MG tablet Take 1 tablet (1,000 mg total) 2 (two) times daily by mouth. 60 tablet 2  . loperamide (IMODIUM) 2 MG capsule Take 2 mg by mouth daily as needed for diarrhea or loose stools.     Marland Kitchen omeprazole (PRILOSEC) 20 MG capsule Take 20 mg by mouth daily.    . OXYGEN 2lpm with sleep  and occ during the day    . XARELTO 15 MG TABS tablet TAKE ONE TABLET BY MOUTH ONCE DAILY WITH  SUPPER (Patient taking differently: TAKE 15mg  BY MOUTH ONCE DAILY WITH  SUPPER) 90 tablet 3   No current facility-administered medications for this visit.     Objective: BP (!) 150/84 (BP Location: Left Arm, Patient Position: Sitting, Cuff Size: Large)   Pulse 76   Temp (!) 97.4 F (36.3 C) (Oral)   Ht 5\' 2"  (1.575 m)   Wt 188 lb 9.6 oz (85.5 kg)   SpO2 92%   BMI 34.50 kg/m  Gen: NAD, resting comfortably CV: RRR no murmurs rubs or gallops Lungs: CTAB no crackles, wheeze, rhonchi Abdomen: soft/nontender/nondistended/normal bowel sounds. No rebound or guarding.   No cva tenderness Neuro: walks with walker  Results for orders placed or performed in visit on 08/10/17 (from the past 24 hour(s))  POCT Urinalysis Dipstick (Automated)     Status: Abnormal   Collection Time: 08/10/17 12:05 PM  Result Value Ref Range   Color, UA Yellow    Clarity, UA Cloudy    Glucose, UA Negative    Bilirubin, UA Negative    Ketones, UA Negative    Spec Grav, UA 1.015 1.010 - 1.025   Blood, UA Negative    pH, UA 6.0 5.0 - 8.0   Protein, UA Negative    Urobilinogen, UA 0.2 0.2 or 1.0 E.U./dL   Nitrite, UA Negative    Leukocytes, UA Moderate (2+) (A) Negative   Assessment/Plan:  Concern for UTI S: Patients symptoms started a week ago with .  Complains of dysuria: yes; polyuria: yes; nocturia: yes; trouble starting stream even if just rushed to bathroom. No abdominal pain reported but husband states she rubs on lower abdomen.  Symptoms are getting slightly worse. Marland Kitchen  ROS- no fever, chills, nausea, vomiting, flank pain. No blood in urine.  A/P: UA concerning for UTI with positive leukocytes. Likely UTI. High risk patient who has ended up hospitalized in the past for UTI- we discussed watching this while waiting culture or going ahead and treating. She prefers to go ahead and start treatment. Will still get culture. Treat with ciprofloxacin for 7 days- 250mg  with GFR and CrCl likely between 30-50- use extended course given patient age. Marland Kitchen PCN allergic- per last klebsiella UTI- otherwise augmentin or keflex would be a good choice. Also considered bactrim.   Patient to follow up if new or worsening symptoms or failure to improve.   Future Appointments  Date Time Provider [place]  08/13/2017  2:00 PM Pieter Partridge, DO [facility] None  08/31/2017  1:15 PM Marin Olp, MD [facility]  12/24/2017 12:00 PM Ladell Pier, MD [facility] None  has had prior UTI but completely healed- this is a new acute concition. Higher risk patient due to age and history of  delirium with UTI.   Return precautions advised.  [name], MD

## 2017-08-11 NOTE — Telephone Encounter (Signed)
Opened a 2nd RN Triage in error

## 2017-08-13 ENCOUNTER — Other Ambulatory Visit: Payer: Self-pay | Admitting: Family Medicine

## 2017-08-13 ENCOUNTER — Ambulatory Visit (INDEPENDENT_AMBULATORY_CARE_PROVIDER_SITE_OTHER): Payer: Medicare Other | Admitting: Neurology

## 2017-08-13 ENCOUNTER — Encounter: Payer: Self-pay | Admitting: Neurology

## 2017-08-13 VITALS — BP 112/64 | HR 84 | Ht 62.5 in | Wt 187.2 lb

## 2017-08-13 DIAGNOSIS — I639 Cerebral infarction, unspecified: Secondary | ICD-10-CM

## 2017-08-13 DIAGNOSIS — F329 Major depressive disorder, single episode, unspecified: Secondary | ICD-10-CM

## 2017-08-13 DIAGNOSIS — M48062 Spinal stenosis, lumbar region with neurogenic claudication: Secondary | ICD-10-CM | POA: Diagnosis not present

## 2017-08-13 DIAGNOSIS — G40109 Localization-related (focal) (partial) symptomatic epilepsy and epileptic syndromes with simple partial seizures, not intractable, without status epilepticus: Secondary | ICD-10-CM

## 2017-08-13 DIAGNOSIS — F32A Depression, unspecified: Secondary | ICD-10-CM

## 2017-08-13 DIAGNOSIS — R4586 Emotional lability: Secondary | ICD-10-CM

## 2017-08-13 LAB — URINE CULTURE
MICRO NUMBER: 90029155
SPECIMEN QUALITY: ADEQUATE

## 2017-08-13 NOTE — Progress Notes (Signed)
NEUROLOGY FOLLOW UP OFFICE NOTE  [name] [id number]  HISTORY OF PRESENT ILLNESS: [name] is an 84 year old right-handed woman with history of hypertension, atrial fibrillation, peripheral vascular disease, anxiety, depression, B Cell lymphoma status post chemotherapy, and COPD who follows up for symptomatic seizure secondary to stroke.  She is accompanied by her husband and younger daughter who provides history.  UPDATE: She is taking Keppra 1000mg  twice daily She has been depressed.  Her daughter states that she sits on the couch all day.  She is very irritable.   HISTORY: I  SEIZURE DISORDER & CARDIOEMBOLIC STROKE: Since childhood, she has episodes of numbness in either of both hands and inability to get words out.  It lasts for about 2 minutes.  It should be known that she has had these episodes since childhood.  Sometimes it was associated with headache.  She had them about twice a year until young adulthood, when they resolved.   She has had several episodes of confusion and aphasia in October and November of 2014, associated with stroke and presumed seizure.  On 05/26/13, MRI of the brain was performed, which revealed an acute lacunar infarct in the left anterior corona radiata and lentiform nucleus.  CTA of the head revealed atherosclerotic changes in the cavernous carotid arteries without significant stenosis.  CTA of neck revealed focal 50% stenosis of mid right ICA.  She was switched from [name] to Eliquis.  She was readmitted to the hospital on 05/31/13 with recurrent aphasia.  She had trouble getting her words out and then couldn't speak at all.  Repeat MRI of brain revealed new punctate infarct in the left posterior corona radiata, the same location as previous infarct. No changes in management were made.  She was readmitted to the hospital on 06/07/13 for altered mental status.  EEG was performed, showing left fronto-parieto-temporal theta slowing.  Due to  possibility of seizure, she was started on Keppra 250mg  BID.  No change made to Eliquis.  She presented to the ED on 06/16/13 after waking up in the morning with expressive aphasia and right-sided weakness.  Symptoms improved in the ED.  CT head revealed prior lacunar infarct in left lentiform nucleus and corona radiata, but no new abnormalities.  It was suspected that she may have had another seizure and her Keppra was increased to 500mg  BID.  She was admitted to the hospital again on 06/21/13 again for expressive aphasia.  MRI brain revealed 2 new punctate infarcts in the bilateral hemispheres at level of centrum semiovale.  Eliquis was changed to Xarelto. Keppra was increased to 750mg  BID.    She presented to the ED on 08/05/14 for recurrent episode of aphasia, lasting 1.5 hours.  This was more significant than the previous year.  She described it as knowing what she wanted to say but having trouble getting the words out.  She had trouble writing as well.  MRI of brain revealed no acute infarct.  She had an episode two days prior, lasting 1 hour.  Carotid doppler revealed no hemodynamically significant ICA stenosis.    She was admitted to [facility] from 05/01/17 to 05/03/17 for altered mental status and generalized weakness with 2 seizures over the weekend.  She was found by her daughter sitting in the dark moaning with foaming at the mouth and left sided shaking lasting a couple of minutes.  She was altered for much longer time.  CT of head from 04/30/17 and 05/01/17 revealed  no acute changes. She was found to have a temperature of 100.3.  She was found to have Klebsiella UTI/cystitis on UA and culture.  She was loaded with Keppra and treated with Cipro.  Keppra was increased to 1000mg  twice daily.  About a week prior, she reportedly had another brief episode of jibberish speech and right facial droop, lasting 2-3 minutes.     She takes Xarelto for secondary stroke prevention, Lipitor for  hyperlipidemia, and Zebeta and Lasix for hypertension.   II LUMBAR STENOSIS Both epidural injections and gabapentin have been ineffective.  She has had worsening back pain with neurogenic claudication.  She is unable to ambulate without a walker.  She reportedly had a repeat MRI of the lumbar spine which showed progression of stenosis and degeneration.  She saw a neurosurgeon, Dr. Ronnald Ramp, who would like to perform surgery.  I did inform them that discontinuing Xarelto for surgery would increase risk for stroke.     III RETINAL MIGRAINES:  Of note, she has longstanding history of migraines, described as scotomas, fortification spectrum and flashing lights in the right eye.  There is no associated headache.  PAST MEDICAL HISTORY: Past Medical History:  Diagnosis Date  . ABDOMINAL INCISIONAL HERNIA 04/03/2010      . Anxiety   . Arthritis    "lots; hands" (05/26/2013)  . Blood transfusion    "w/1st miscarriage" (05/26/2013)  . COPD (chronic obstructive pulmonary disease) ([place])   . Coronary artery disease   . Depression   . Diverticulitis of colon with hemorrhage 07/24/2008  . DVT (deep venous thrombosis) ([place]) 1954   "after I had my first child" (05/26/2013)  . Exertional shortness of breath   . Family history of anesthesia complication    "sisters also get sick as a dog" (05/26/2013)  . GERD 05/03/2007      . GERD (gastroesophageal reflux disease)   . H/O hiatal hernia   . HCAP (healthcare-associated pneumonia) 06/26/2013  . Heart murmur    "when I was a child" (05/26/2013)  . History of TIAs   . Hyperlipidemia   . Hypertension   . Laryngeal carcinoma ([place]) hx of ca   remotely with resection and xrt/chronic hoarsemenss  . Lymphona, mantle cell, inguinal region/lower limb ([place])    "large c-cell" (05/26/2013)  . OSTEOARTHRITIS 05/03/2007  . PEPTIC ULCER DISEASE 07/06/2008  . Pneumonia 2013   "once" (05/26/2013)  . PUD (peptic ulcer disease)   . PVD (peripheral vascular disease)  ([place])   . Seizures ([place])   . Stroke ([place]) 05/26/2013   "very mild; affected my speech for a while" (05/26/2013)  . VARICOSE VEINS LOWER EXTREMITIES W/INFLAMMATION 01/15/2009    MEDICATIONS: Current Outpatient Medications on File Prior to Visit  Medication Sig Dispense Refill  . acetaminophen (TYLENOL) 650 MG CR tablet Take 650 mg by mouth every 8 (eight) hours as needed for pain. Reported on 12/09/2015    . atorvastatin (LIPITOR) 20 MG tablet TAKE 1 TABLET BY MOUTH ONCE DAILY (Patient taking differently: TAKE 20mg  BY MOUTH ONCE DAILY) 30 tablet 5  . bisoprolol (ZEBETA) 5 MG tablet Take 1 tablet (5 mg total) by mouth daily. 30 tablet 5  . cholecalciferol (VITAMIN D) 1000 UNITS tablet Take 1,000 Units by mouth daily.    . ciprofloxacin (CIPRO) 250 MG tablet Take 1 tablet (250 mg total) by mouth 2 (two) times daily. 14 tablet 0  . Cyanocobalamin (VITAMIN B-12 PO) Take 1 tablet by mouth daily.    . famotidine (PEPCID)  20 MG tablet Take 1 tablet (20 mg total) at bedtime by mouth.    . furosemide (LASIX) 20 MG tablet Take 1 tablet (20 mg total) by mouth daily as needed for edema. 30 tablet 2  . gabapentin (NEURONTIN) 100 MG capsule TAKE 1 CAPSULE BY MOUTH THREE TIMES DAILY 270 capsule 1  . levETIRAcetam (KEPPRA) 1000 MG tablet Take 1 tablet (1,000 mg total) by mouth 2 (two) times daily. 60 tablet 0  . levETIRAcetam (KEPPRA) 1000 MG tablet Take 1 tablet (1,000 mg total) 2 (two) times daily by mouth. 60 tablet 2  . loperamide (IMODIUM) 2 MG capsule Take 2 mg by mouth daily as needed for diarrhea or loose stools.     Marland Kitchen omeprazole (PRILOSEC) 20 MG capsule Take 20 mg by mouth daily.    . OXYGEN 2lpm with sleep and occ during the day    . XARELTO 15 MG TABS tablet TAKE ONE TABLET BY MOUTH ONCE DAILY WITH  SUPPER (Patient taking differently: TAKE 15mg  BY MOUTH ONCE DAILY WITH  SUPPER) 90 tablet 3   No current facility-administered medications on file prior to visit.     ALLERGIES: Allergies  Allergen  Reactions  . Ativan [Lorazepam] Other (See Comments)    Severe hallucinations   . Propoxyphene Hcl   . Codeine Itching  . Penicillins Rash  . Vancomycin Rash    FAMILY HISTORY: Family History  Problem Relation Age of Onset  . Colon cancer Sister        colon  . Hyperlipidemia Mother   . Stroke Mother   . Cancer Father        mesothelioma    SOCIAL HISTORY: Social History   Socioeconomic History  . Marital status: Married    Spouse name: Not on file  . Number of children: 6  . Years of education: Not on file  . Highest education level: Not on file  Social Needs  . Financial resource strain: Not on file  . Food insecurity - worry: Not on file  . Food insecurity - inability: Not on file  . Transportation needs - medical: Not on file  . Transportation needs - non-medical: Not on file  Occupational History  . Occupation: retired    Fish farm manager: RETIRED  Tobacco Use  . Smoking status: Former Smoker    Packs/day: 2.50    Years: 37.00    Pack years: 92.50    Types: Cigarettes    Last attempt to quit: 09/05/1984    Years since quitting: 32.9  . Smokeless tobacco: Never Used  Substance and Sexual Activity  . Alcohol use: No    Alcohol/week: 0.0 oz  . Drug use: No  . Sexual activity: No  Other Topics Concern  . Not on file  Social History Narrative   Married (husband patient of Dr. Yong Channel). 6 children (lost 2 for total 8). 15 grandkids. 6 greatgrandchildren.    Lives with husband. Husband drives. Patient does not drive.       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     REVIEW OF SYSTEMS: Constitutional: No fevers, chills, or sweats, no generalized fatigue, change in appetite Eyes: No visual changes, double vision, eye pain Ear, nose and throat: No hearing loss, ear pain, nasal congestion, sore throat Cardiovascular: No chest pain, palpitations Respiratory:  No shortness of breath at rest or with exertion,  wheezes GastrointestinaI: No nausea, vomiting, diarrhea, abdominal pain,  fecal incontinence Genitourinary:  No dysuria, urinary retention or frequency Musculoskeletal:  No neck pain, back pain Integumentary: No rash, pruritus, skin lesions Neurological: as above Psychiatric: No depression, insomnia, anxiety Endocrine: No palpitations, fatigue, diaphoresis, mood swings, change in appetite, change in weight, increased thirst Hematologic/Lymphatic:  No purpura, petechiae. Allergic/Immunologic: no itchy/runny eyes, nasal congestion, recent allergic reactions, rashes  PHYSICAL EXAM: Vitals:   08/13/17 1429  BP: 112/64  Pulse: 84  SpO2: (!) 86%   General: No acute distress.    IMPRESSION: 1.  Localization-related symptomatic epilepsy. 2.  History of cardioembolic stroke 3.  Lumbar stenosis 4.  Mood changes.  She may be just experiencing depression related to the death of her daughter, her chronic back pain and loss of some independence (such as driving).  However, it may be side effect of Keppra as well.  PLAN: We discussed switching Keppra to another AED.  At this time, she does not wish to make any changes.  She will continue 1000mg  twice daily.  If she decides she would like to change AEDs, then she will contact the office. I also told the daughter that she may want to see a psychiatrist as well.  Otherwise, follow up in 5 months.  30 minutes spent face to face with patient, 100% spent discussing management.  Metta Clines, DO  CC:  [name], MD

## 2017-08-13 NOTE — Patient Instructions (Addendum)
1.  We will continue the levetiracetam (Keppra) 1000mg  twice daily.  If you would like to switch to another medication, feel free to contact the office and we can make that change. 2.  Otherwise, follow up in 5 months

## 2017-08-31 ENCOUNTER — Ambulatory Visit: Payer: Medicare Other | Admitting: Family Medicine

## 2017-09-03 ENCOUNTER — Ambulatory Visit: Payer: Medicare Other | Admitting: Family Medicine

## 2017-09-03 ENCOUNTER — Encounter: Payer: Self-pay | Admitting: Family Medicine

## 2017-09-03 VITALS — BP 136/68 | HR 76 | Temp 98.1°F | Ht 62.5 in | Wt 189.6 lb

## 2017-09-03 DIAGNOSIS — J449 Chronic obstructive pulmonary disease, unspecified: Secondary | ICD-10-CM

## 2017-09-03 DIAGNOSIS — I480 Paroxysmal atrial fibrillation: Secondary | ICD-10-CM | POA: Diagnosis not present

## 2017-09-03 DIAGNOSIS — J9611 Chronic respiratory failure with hypoxia: Secondary | ICD-10-CM

## 2017-09-03 DIAGNOSIS — C8510 Unspecified B-cell lymphoma, unspecified site: Secondary | ICD-10-CM | POA: Diagnosis not present

## 2017-09-03 DIAGNOSIS — K529 Noninfective gastroenteritis and colitis, unspecified: Secondary | ICD-10-CM | POA: Diagnosis not present

## 2017-09-03 DIAGNOSIS — I5032 Chronic diastolic (congestive) heart failure: Secondary | ICD-10-CM

## 2017-09-03 NOTE — Progress Notes (Signed)
Subjective:  [name] is a 84 y.o. year old very pleasant female patient who presents for/with See problem oriented charting ROS- still with chronic low back pain and diarrhea. Denies increased edema. Denies chest pain. Denies increased SOB   Past Medical History-  Patient Active Problem List   Diagnosis Date Noted  . Chronic respiratory failure with hypoxia (HCC) 06/15/2017    Priority: High  . Diastolic CHF ([place]) 01/60/1093    Priority: High  . History of cardioembolic stroke 23/55/7322    Priority: High  . Spinal stenosis, lumbar region, with neurogenic claudication 05/09/2014    Priority: High  . History of Focal seizure ([place]) 06/16/2013    Priority: High  . TIA (transient ischemic attack) 05/31/2013    Priority: High  . History of Large B-cell lymphoma ([place]) 02/18/2012    Priority: High  . Atrial fibrillation ([place]) 12/14/2008    Priority: High  . Bacteremia due to Streptococcus 11/21/2015    Priority: Medium  . Cellulitis of leg, right 11/20/2015    Priority: Medium  . Diverticulitis of colon 09/26/2014    Priority: Medium  . CKD (chronic kidney disease), stage III ([place]) 09/26/2014    Priority: Medium  . Laryngeal carcinoma ([place])     Priority: Medium  . Anxiety state 10/20/2008    Priority: Medium  . Depression 10/20/2008    Priority: Medium  . Dyspnea on exertion 07/06/2008    Priority: Medium  . COPD GOLD II 03/26/2008    Priority: Medium  . Hyperlipidemia 03/18/2007    Priority: Medium  . Essential hypertension 03/18/2007    Priority: Medium  . Aortic atherosclerosis ([place]) 06/30/2016    Priority: Low  . Weakness generalized 06/30/2013    Priority: Low  . Bilateral hand pain 01/09/2013    Priority: Low  . Chronic diarrhea 09/04/2017    Medications- reviewed and updated Current Outpatient Medications  Medication Sig Dispense Refill  . acetaminophen (TYLENOL) 650 MG CR tablet Take 650 mg by mouth every 8 (eight) hours as needed for pain.  Reported on 12/09/2015    . atorvastatin (LIPITOR) 20 MG tablet TAKE 1 TABLET BY MOUTH ONCE DAILY (Patient taking differently: TAKE 20mg  BY MOUTH ONCE DAILY) 30 tablet 5  . bisoprolol (ZEBETA) 5 MG tablet TAKE ONE TABLET BY MOUTH ONCE DAILY 30 tablet 5  . cholecalciferol (VITAMIN D) 1000 UNITS tablet Take 1,000 Units by mouth daily.    . Cyanocobalamin (VITAMIN B-12 PO) Take 1 tablet by mouth daily.    . famotidine (PEPCID) 20 MG tablet Take 1 tablet (20 mg total) at bedtime by mouth.    . furosemide (LASIX) 20 MG tablet Take 1 tablet (20 mg total) by mouth daily as needed for edema. 30 tablet 2  . gabapentin (NEURONTIN) 100 MG capsule TAKE 1 CAPSULE BY MOUTH THREE TIMES DAILY 270 capsule 1  . levETIRAcetam (KEPPRA) 1000 MG tablet Take 1 tablet (1,000 mg total) by mouth 2 (two) times daily. 60 tablet 0  . loperamide (IMODIUM) 2 MG capsule Take 2 mg by mouth daily as needed for diarrhea or loose stools.     Marland Kitchen omeprazole (PRILOSEC) 20 MG capsule Take 20 mg by mouth daily.    . OXYGEN 2lpm with sleep and occ during the day    . XARELTO 15 MG TABS tablet TAKE ONE TABLET BY MOUTH ONCE DAILY WITH  SUPPER (Patient taking differently: TAKE 15mg  BY MOUTH ONCE DAILY WITH  SUPPER) 90 tablet 3   No current facility-administered  medications for this visit.      Objective: BP 136/68 (BP Location: Left Arm, Patient Position: Sitting, Cuff Size: Large)   Pulse 76   Temp 98.1 F (36.7 C) (Oral)   Ht 5' 2.5" (1.588 m)   Wt 189 lb 9.6 oz (86 kg)   SpO2 93%   BMI 34.13 kg/m  Gen: NAD, resting comfortably CV: RRR no murmurs rubs or gallops Lungs: CTAB no crackles, wheeze, rhonchi Abdomen: soft/nontender/nondistended/normal bowel sounds.  Ext: no edema Skin: warm, dry tangenial in thought at times, easily agitated with husband, walks with cane  Assessment/Plan  Chronic respiratory failure with hypoxia (HCC) Patient continues to decline oxygen at home despite the fact that when she has mild hypoxia she  has some confusion- luckily today maintaining sats at 93%. Pulmonary has advised her to have this on hand but she is opposed  Chronic diarrhea S: Diarrhea issues since her diverting ileostomy and then ileostomy closure. She takes immodium once daily max 3 days a week. She does sometimes overdo dietary items such as sweet tea or chocolate donuts.  A/P: Patient states this is the main reason for her visit- wants to know if she can do anything differently. Could potentially cut down on excess sweets- also could potentially try probiotic though not sure with chronicity that this would help.    Atrial fibrillation ([place]) Compliant with xarelto 15mg  daily (with GFR near 50). She is compliant with bisoprolol. Remains paroxysmal- not obviously in atrial fibrillation today.   COPD GOLD II Reason for her hypoxemia and DOE- not on any inhalers- she is so sedentary at baseline- that it is not clear that aggressive therapy would benefit her as per notes from Dr. Melvyn Novas. Also luckily she has not been having acute flare ups to necessitate controller medications  Diastolic CHF ([place]) Patient knows to use lasix if she has weight gain or progressive edema- no worsening edema at this point so will monitor only.  Weight up slightly but that could be from the holiday season and overall relatively consistent compared to last year.    History of Large B-cell lymphoma ([place]) Diagnosed 2013. Follows with oncology. Last notes state that patient remains in remission.   States 2nd reason for visit per patient- She also asks if stroke could come off her diagnosis list but with prior findings on MRi we discussed could not remove that. We used visit to review several of her chronic medical problems.   Future Appointments  Date Time Provider [place]  09/23/2017  1:00 PM Williemae Area, RN [facility]  12/24/2017 12:00 PM Ladell Pier, MD [facility] None  01/11/2018  2:30 PM Pieter Partridge, DO [facility] None    Return precautions advised.  [name], MD

## 2017-09-03 NOTE — Patient Instructions (Addendum)
Have you had an annual wellness visit yet?  I would also like for you to sign up for an annual wellness visit with one of our nurses, Cassie or Manuela Schwartz, who both specialize in the annual wellness visit. This is a free benefit under medicare that may help [place] find additional ways to help you. Some highlights are reviewing medications, lifestyle, and doing a dementia screen.   For bowels- try eating a healthier diet with lower processed sugar

## 2017-09-04 DIAGNOSIS — K529 Noninfective gastroenteritis and colitis, unspecified: Secondary | ICD-10-CM | POA: Insufficient documentation

## 2017-09-04 NOTE — Assessment & Plan Note (Signed)
Diagnosed 2013. Follows with oncology. Last notes state that patient remains in remission.

## 2017-09-04 NOTE — Assessment & Plan Note (Signed)
Compliant with xarelto 15mg  daily (with GFR near 50). She is compliant with bisoprolol. Remains paroxysmal- not obviously in atrial fibrillation today.

## 2017-09-04 NOTE — Assessment & Plan Note (Signed)
Patient knows to use lasix if she has weight gain or progressive edema- no worsening edema at this point so will monitor only.  Weight up slightly but that could be from the holiday season and overall relatively consistent compared to last year.

## 2017-09-04 NOTE — Assessment & Plan Note (Signed)
Patient continues to decline oxygen at home despite the fact that when she has mild hypoxia she has some confusion- luckily today maintaining sats at 93%. Pulmonary has advised her to have this on hand but she is opposed

## 2017-09-04 NOTE — Assessment & Plan Note (Signed)
S: Diarrhea issues since her diverting ileostomy and then ileostomy closure. She takes immodium once daily max 3 days a week. She does sometimes overdo dietary items such as sweet tea or chocolate donuts.  A/P: Patient states this is the main reason for her visit- wants to know if she can do anything differently. Could potentially cut down on excess sweets- also could potentially try probiotic though not sure with chronicity that this would help.

## 2017-09-04 NOTE — Assessment & Plan Note (Signed)
Reason for her hypoxemia and DOE- not on any inhalers- she is so sedentary at baseline- that it is not clear that aggressive therapy would benefit her as per notes from Dr. Melvyn Novas. Also luckily she has not been having acute flare ups to necessitate controller medications

## 2017-09-20 ENCOUNTER — Other Ambulatory Visit: Payer: Self-pay | Admitting: Family Medicine

## 2017-09-23 ENCOUNTER — Ambulatory Visit: Payer: Medicare Other | Admitting: *Deleted

## 2017-09-30 ENCOUNTER — Encounter: Payer: Self-pay | Admitting: *Deleted

## 2017-09-30 ENCOUNTER — Ambulatory Visit (INDEPENDENT_AMBULATORY_CARE_PROVIDER_SITE_OTHER): Payer: Medicare Other | Admitting: *Deleted

## 2017-09-30 VITALS — BP 142/70 | HR 90 | Ht 63.0 in | Wt 192.0 lb

## 2017-09-30 DIAGNOSIS — Z0000 Encounter for general adult medical examination without abnormal findings: Secondary | ICD-10-CM | POA: Diagnosis not present

## 2017-09-30 NOTE — Progress Notes (Signed)
Subjective:   [name] is a 84 y.o. female who presents for Medicare Annual (Subsequent) preventive examination.  Reports health good  Both she and spouse of 9 years live at home and youngest dtr; 84 yo lives with them with grandson  OV 09/03/17 diarrhea  Hx pneumonia Epilepsy; Dr. Tomi Likens Laryngeal carcinoma; 63'  Diet  Chol 170 hdl 33; trig 235 A1c 5.9 BMI 34  Breakfast peanut and jelly Lunch sandwich Ham and swiss Sometimes soup Dinner; dtr cooks    Exercise Starting to walk some  Balance is an issue  Hx colon cancer with sister  Colonoscopy 02/2012 Mammogram 2009  No bone density - spouse thinks she has had this and chooses to defer at this time   92 pack years hc of smoking, but quit in 86 with neck dissection    There are no preventive care reminders to display for this patient. Cardiac Risk Factors include: advanced age (>39men, >66 women);dyslipidemia;family history of premature cardiovascular disease;hypertension;obesity (BMI >30kg/m2);sedentary lifestyle       Objective:     Vitals: BP (!) 142/70   Pulse 90   Ht 5\' 3"  (1.6 m)   Wt 192 lb (87.1 kg)   SpO2 92%   BMI 34.01 kg/m   Body mass index is 34.01 kg/m.  Advanced Directives 09/30/2017 05/01/2017 05/01/2017 04/30/2017 04/09/2017 08/24/2016 12/26/2015  Does Patient Have a Medical Advance Directive? No Yes Yes Yes No No No  Type of Advance Directive - Public librarian;Living will [place];Living will - - -  Does patient want to make changes to medical advance directive? - No - Patient declined - - - - -  Copy of [place] in Chart? - No - copy requested No - copy requested No - copy requested - - -  Would patient like information on creating a medical advance directive? - - No - Patient declined No - Patient declined - No - Patient declined Yes - Educational materials given  Pre-existing out of facility DNR order (yellow form or pink MOST form)  - - - - - - -   In the process of completing.   Tobacco Social History   Tobacco Use  Smoking Status Former Smoker  . Packs/day: 2.50  . Years: 37.00  . Pack years: 92.50  . Types: Cigarettes  . Last attempt to quit: 09/05/1984  . Years since quitting: 33.0  Smokeless Tobacco Never Used  Tobacco Comment   quit when she was dx with cancer     Counseling given: Yes Comment: quit when she was dx with cancer   Clinical Intake:     Past Medical History:  Diagnosis Date  . ABDOMINAL INCISIONAL HERNIA 04/03/2010      . Anxiety   . Arthritis    "lots; hands" (05/26/2013)  . Blood transfusion    "w/1st miscarriage" (05/26/2013)  . COPD (chronic obstructive pulmonary disease) ([place])   . Coronary artery disease   . Depression   . Diverticulitis of colon with hemorrhage 07/24/2008  . DVT (deep venous thrombosis) ([place]) 1954   "after I had my first child" (05/26/2013)  . Exertional shortness of breath   . Family history of anesthesia complication    "sisters also get sick as a dog" (05/26/2013)  . GERD 05/03/2007      . GERD (gastroesophageal reflux disease)   . H/O hiatal hernia   . HCAP (healthcare-associated pneumonia) 06/26/2013  . Heart murmur    "  when I was a child" (05/26/2013)  . History of TIAs   . Hyperlipidemia   . Hypertension   . Laryngeal carcinoma ([place]) hx of ca   remotely with resection and xrt/chronic hoarsemenss  . Lymphona, mantle cell, inguinal region/lower limb ([place])    "large c-cell" (05/26/2013)  . OSTEOARTHRITIS 05/03/2007  . PEPTIC ULCER DISEASE 07/06/2008  . Pneumonia 2013   "once" (05/26/2013)  . PUD (peptic ulcer disease)   . PVD (peripheral vascular disease) ([place])   . Seizures ([place])   . Stroke ([place]) 05/26/2013   "very mild; affected my speech for a while" (05/26/2013)  . VARICOSE VEINS LOWER EXTREMITIES W/INFLAMMATION 01/15/2009   Past Surgical History:  Procedure Laterality Date  . ABDOMINAL EXPLORATION SURGERY  12/01/2012  . APPENDECTOMY   09/13/2008  . CAROTID ENDARTERECTOMY Bilateral 2004   2004 a month apart right and left  . CARPAL TUNNEL RELEASE Left ?1960's  . CATARACT EXTRACTION  2005   bilateral  . COLON SURGERY  09/13/2008   Laparoscopic assisted converted to open sigmoid colectomy.Archie Endo 09/13/2008 (05/26/2013)  . DILATION AND CURETTAGE OF UTERUS  1950's   "had 2 for miscarriages" (05/26/2013)  . DIVERTING ILEOSTOMY  09/22/2008   iliostomy for diverticulitis/notes 09/22/2008 (05/26/2013)  . HERNIA REPAIR  05/2010   ventral hernia repair  . ILEOSTOMY CLOSURE  01/2010  . RADICAL NECK DISSECTION  1986   "larynx cancer" (05/26/2013)  . TONSILLECTOMY AND ADENOIDECTOMY  1936   "tonsils grew back; no 2nd OR" (05/26/2013)  . TOTAL KNEE ARTHROPLASTY Bilateral    2002 left 2008 right  . TUBAL LIGATION  1972   Family History  Problem Relation Age of Onset  . Colon cancer Sister        colon  . Hyperlipidemia Mother   . Stroke Mother   . Cancer Father        mesothelioma   Social History   Socioeconomic History  . Marital status: Married    Spouse name: Not on file  . Number of children: 6  . Years of education: Not on file  . Highest education level: Not on file  Social Needs  . Financial resource strain: Not on file  . Food insecurity - worry: Not on file  . Food insecurity - inability: Not on file  . Transportation needs - medical: Not on file  . Transportation needs - non-medical: Not on file  Occupational History  . Occupation: retired    Fish farm manager: RETIRED  Tobacco Use  . Smoking status: Former Smoker    Packs/day: 2.50    Years: 37.00    Pack years: 92.50    Types: Cigarettes    Last attempt to quit: 09/05/1984    Years since quitting: 33.0  . Smokeless tobacco: Never Used  . Tobacco comment: quit when she was dx with cancer  Substance and Sexual Activity  . Alcohol use: No    Alcohol/week: 0.0 oz  . Drug use: No  . Sexual activity: No  Other Topics Concern  . Not on file  Social History  Narrative   Married (husband patient of Dr. Yong Channel). 6 children (lost 2 for total 8). 15 grandkids. 6 greatgrandchildren.    Lives with husband. Husband drives. Patient does not drive.       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     Outpatient Encounter Medications as of 09/30/2017  Medication Sig  . atorvastatin (LIPITOR)  20 MG tablet TAKE 1 TABLET BY MOUTH ONCE DAILY (Patient taking differently: TAKE 20mg  BY MOUTH ONCE DAILY)  . bisoprolol (ZEBETA) 5 MG tablet TAKE ONE TABLET BY MOUTH ONCE DAILY  . cholecalciferol (VITAMIN D) 1000 UNITS tablet Take 1,000 Units by mouth daily.  . Cyanocobalamin (VITAMIN B-12 PO) Take 1 tablet by mouth daily.  . furosemide (LASIX) 20 MG tablet Take 1 tablet (20 mg total) by mouth daily as needed for edema.  . gabapentin (NEURONTIN) 100 MG capsule TAKE 1 CAPSULE BY MOUTH THREE TIMES DAILY  . levETIRAcetam (KEPPRA) 1000 MG tablet Take 1 tablet (1,000 mg total) by mouth 2 (two) times daily.  Marland Kitchen loperamide (IMODIUM) 2 MG capsule Take 2 mg by mouth daily as needed for diarrhea or loose stools.   Marland Kitchen omeprazole (PRILOSEC) 20 MG capsule Take 20 mg by mouth daily.  Alveda Reasons 15 MG TABS tablet TAKE 1 TABLET BY MOUTH ONCE DAILY WITH SUPPER  . acetaminophen (TYLENOL) 650 MG CR tablet Take 650 mg by mouth every 8 (eight) hours as needed for pain. Reported on 12/09/2015  . famotidine (PEPCID) 20 MG tablet Take 1 tablet (20 mg total) at bedtime by mouth. (Patient not taking: Reported on 09/30/2017)  . OXYGEN 2lpm with sleep and occ during the day   No facility-administered encounter medications on file as of 09/30/2017.     Activities of Daily Living In your present state of health, do you have any difficulty performing the following activities: 09/30/2017 05/01/2017  Hearing? Y N  Vision? N N  Difficulty concentrating or making decisions? N N  Comment spouse agrees  -  Walking or climbing stairs? Y N  Dressing  or bathing? N Y  Doing errands, shopping? N Y  Conservation officer, nature and eating ? N -  Comment or dtr helps -  Using the Toilet? N -  In the past six months, have you accidently leaked urine? N -  Do you have problems with loss of bowel control? Y -  Comment some diarrhea but is controlled -  Managing your Medications? N -  Managing your Finances? N -  Housekeeping or managing your Housekeeping? N -  Some recent data might be hidden    Patient Care Team: Marin Olp, MD as PCP - General (Family Medicine) Eustace Moore, MD as Consulting Physician (Neurosurgery)    Assessment:   This is a routine wellness examination for [facility].  Exercise Activities and Dietary recommendations    Goals    . Patient Stated     Find an activity that you find stimulating Given resources May try to find a pool somewhere close        Fall Risk Fall Risk  09/30/2017 08/13/2017 05/05/2017 12/31/2016 11/10/2016  Falls in the past year? No No Yes No No  Number falls in past yr: - - 2 or more - -  Injury with Fall? - - - - -  Risk for fall due to : - - - - -  Follow up - - Falls evaluation completed - -     Depression Screen PHQ 2/9 Scores 09/30/2017 12/31/2016 12/26/2015 10/28/2015  PHQ - 2 Score 0 0 0 0    States no but is still adjusting to being disabled since her stroke. She was used to being very active and now she can't drive. Given resources for her to explore activities in the community   Cognitive Function MMSE - Mini Mental State Exam 09/30/2017  Not completed: (No  Data)   Ad8 score reviewed for issues:  Issues making decisions:  Less interest in hobbies / activities:  Repeats questions, stories (family complaining):  Trouble using ordinary gadgets (microwave, computer, phone):  Forgets the month or year:   Mismanaging finances:   Remembering appts:  Daily problems with thinking and/or memory: Ad8 score is=0          Immunization History  Administered Date(s)  Administered  . Influenza, High Dose Seasonal PF 05/05/2013, 04/28/2016  . Influenza,inj,Quad PF,6+ Mos 04/24/2014, 05/06/2015, 04/26/2017  . Pneumococcal Conjugate-13 04/11/2015  . Pneumococcal Polysaccharide-23 05/05/2013  . Tdap 01/27/2011     Screening Tests Health Maintenance  Topic Date Due  . DEXA SCAN  09/30/2018 (Originally 03/11/1995)  . TETANUS/TDAP  01/26/2021  . INFLUENZA VACCINE  Completed  . PNA vac Low Risk Adult  Completed         Plan:      PCP Notes   Health Maintenance To schedule eye exam Educated regarding shingrix    Abnormal Screens  Hearing loss difficult to measure with in house tool Agrees to have a hearing screen and given resources for one free hearing aid if needed    Referrals  Given resources for activities in the community  May try pool as she felt "free" as this may help her to feel "free" and not so disabled. (if tolerated )  Hx of playing tennis into her 28's and golf in her 60's. Misses sports  Discussed playing cards or other things she can get involved in at the [facility] etc.  Patient concerns; Denies depression but still coping with loss of dtr last July and aftermath of stroke / loss of driving license. Spouse agreed to go with her if she wanted to try the pool and explained all exercise had to be approved by her doctors and that she would need a handicapped pool to enter and get out. Feels she could do this. States she is not using her oxygen; told her to get up and walk in the home as much as possible.  Moves with her walker well with some balance issues but no hx of falls. Nice couple; 56 years of marriage!  Nurse Concerns; As noted    Next PCP apt TBS with Dr. Yong Channel    I have personally reviewed and noted the following in the patient's chart:   . Medical and social history . Use of alcohol, tobacco or illicit drugs  . Current medications and supplements . Functional ability and status . Nutritional  status . Physical activity . Advanced directives . List of other physicians . Hospitalizations, surgeries, and ER visits in previous 12 months . Vitals . Screenings to include cognitive, depression, and falls . Referrals and appointments  In addition, I have reviewed and discussed with patient certain preventive protocols, quality metrics, and best practice recommendations. A written personalized care plan for preventive services as well as general preventive health recommendations were provided to patient.     Wynetta Fines, RN  09/30/2017

## 2017-09-30 NOTE — Patient Instructions (Addendum)
[name] , Thank you for taking time to come for your Medicare Wellness Visit. I appreciate your ongoing commitment to your health goals. Please review the following plan we discussed and let me know if I can assist you in the future.   Please schedule an eye exam    Shingrix is a vaccine for the prevention of Shingles in Adults 50 and older.  If you are on Medicare, you can request a prescription from your doctor to be filled at a pharmacy.  Please check with your benefits regarding applicable copays or out of pocket expenses.  The Shingrix is given in 2 vaccines approx 8 weeks apart. You must receive the 2nd dose prior to 6 months from receipt of the first.   May want a hearing screen. Deaf & Hard of Hearing Division Services - can assist with hearing aid x 1  No reviews  [employer] Office  [address]  [phone]  http://clienthiadev.devcloud.acquia-sites.com/sites/default/files/hearingpedia/Guide_How_to_Buy_Hearing_Aids.pdf    [employer]; [phone] -[place]; [phone] Get resource to get information on any and all community programs for Seniors  Community solutions; "Aging Gracefully In Place" program; can request or apply  [place]: [phone] Community Health Response Program -026-378-5885 Public Health Dept; Need to be a skilled visit but can assist with bathing as well; ([phone]  Adult center for Enrichment;  Call Senior Line; [phone]  Adult day services include Adult Day Care, Adult Day Healthcare, Group Respite, Care Partners, Volunteer In [place], Education and Support Program  Help with Rx at [facility]  Monday - Friday 8am to 10pm EST Sat- Sunday 9am to 7pm Patient help line [phone] Email support online at GeminiCard.gl  Dept of Social Services; Call [phone] and ask for SW on call  Options for Medicaid include the Community Alternatives program; Deepstep-PCS.org  (personal care services) or PACE program, which is a medical and social program combined    MobileCycles.pl general resources for food etc   Http://nihseniorhealth.giv  Deaf & Hard of Hearing Division Services - can assist with hearing aid x 1  No reviews  [place]  [address]  [phone]  Resource to find disability equipment (used) online; https://bradley.com/  Up walker for those that need more support!      These are the goals we discussed: Goals    . Patient Stated     Find an activity that you find stimulating Given resources May try to find a pool somewhere close        This is a list of the screening recommended for you and due dates:  Health Maintenance  Topic Date Due  . DEXA scan (bone density measurement)  09/30/2018*  . Tetanus Vaccine  01/26/2021  . Flu Shot  Completed  . Pneumonia vaccines  Completed  *Topic was postponed. The date shown is not the original due date.    Community Occupational psychologist of Services Cost  A Matter of Balance Class locations vary. Call [place] on Aging for more information.  http://dawson-may.com/ ([phone] 8-Session program addressing the fear of falling and increasing activity levels of older adults Free to minimal cost  A.C.T. By [employer] [address] [place], [place] [address].  BetaBlues.dk [phone]  Personal training, gym, classes including Silver Sneakers* and ACTion for Aging Adults Fee-based  A.H.O.Y. (Add Health to Our Years) Airs on Time [employer] 13, M-F at 8am and 1pm  [place] Locations: [employer],  [place] [employer] [place],  [place], [place] [facility], [address] [facility], [place], [place], [place] [address]  [place] Location: Sharrell Ku. [employer] [place]      [phone]  [phone]  [phone]  [phone]  [phone]  [phone]  [phone]  [phone]  [phone]  [phone]    [phone] A total-body conditioning class for adults 39 and older; designed to increase muscular strength, endurance, range of movement, flexibility, balance, agility and coordination Free  [place] [facility] [place], [place] [address] [place]      [address]      [phone]      Pilate's class for individualsreturning to exercise after an injury, before or after surgery or for individuals with complex musculoskeletal issues; designed to improve strength, balance , flexibility      $15/class  [place] [address] [place], [place] [address] www.[email or web address].dk [place] classes for beginners to advanced [place], [place] [address] Seniorcenter_0 -resources-[place].org www.senior-rescources-[place].org/sr.center.cfm [place] Chair Exercises Free, ages 75 and older; Ages 74-59 fee based  Marvia Pickles, [facility] [address] [place], [place] [address] Seniorcenter_1 .Beverlee Nims [phone]  A.H.O.Y. Tai Chi Fee-based Donation based or free  [place] Class locations vary.  Call or email Angela Burke or view website for more information. Info_[email or web address] .com GainPain.com.cy.html [phone] Ongoing classes at local YMCAs and gyms Fee-based  Silver Sneakers A.C.T. By [place]  [employer]: [place] [phone]  [phone] [phone] Classes designed for older adults who want to improve their strength, flexibility, balance and endurance.   Silver sneakers is covered by some insurance plans and includes a fitness center membership at participating locations. Find out more by calling [phone] or visiting www.[email or web address] Covered by some insurance plans  [facility] [place] [phone] A.H.O.Y., fitness room, personal training, fitness classes for injury prevention, strength, balance, flexibility, water fitness classes Ages 55+: $59 for 6 months; Ages 43-54: $18 for 6 months  Tai Chi for Everybody [facility] [address] [place],  [address] Taichiforeverybody_3 .Patsi Sears [phone] Tai Chi classes for beginners to advanced; geared for seniors Donation Based      [employer] (Hel[place] Others Participate in Exercise     Loyal Gambler. Rosana Hoes, PhD, [place] pgdavis_[email or web address] .edu [place]     [phone]     A comprehensive fitness program for adults.  The program paris senior-level undergraduates Kinesiology students with adults who desire to learn how to exercise safely.  Includes a structural exercise class focusing on functional fitnesss     $100/semester in fall and spring; $75 in summer (no trainers)    *Silver Sneakers is covered by some Personal assistant and includes a  Radio producer  at participating locations.  Find out more by calling [phone] or visiting [email or web address]  For additional health and human services resources for senior adults, please contact  SeniorLine at [phone] in [place] and [place] at [phone] in all other areas.  Fall Prevention in the Home Falls can cause injuries. They can happen to people of all ages. There are many things you can do to make your home safe and to help prevent falls. What can I do on the outside of my home?  Regularly fix the edges of walkways and driveways and fix any cracks.  Remove anything that might make you trip as you walk through a door, such as a raised step or threshold.  Trim any bushes or trees on the path to your home.  Use bright outdoor lighting.  Clear any walking paths of anything that might make someone trip, such as rocks or tools.  Regularly check to see if handrails are loose or broken. Make sure that both sides of any steps have handrails.  Any raised decks and porches should have guardrails on the edges.  Have any leaves, snow, or ice cleared regularly.  Use sand or salt on walking paths during winter.  Clean up any spills in your garage right away. This includes oil or grease spills. What can I do in the bathroom?  Use night lights.  Install grab bars by the toilet and in the tub and shower. Do not use towel bars as grab bars.  Use non-skid mats or decals in the tub or shower.  If you need to sit down in the shower, use a plastic, non-slip stool.  Keep the floor dry. Clean up any water that spills on the floor as soon as it happens.  Remove soap buildup in the tub or shower regularly.  Attach bath mats securely with double-sided non-slip rug tape.  Do not have throw rugs and other things on the floor that can make you trip. What can I do in the bedroom?  Use night lights.  Make sure that you have a light by your bed that is easy to reach.  Do not use any sheets or blankets that are too big for your bed. They should not hang down onto the floor.  Have a firm chair that has side arms. You can use this for support while you get dressed.  Do not have  throw rugs and other things on the floor that can make you trip. What can I do in the kitchen?  Clean up any spills right away.  Avoid walking on wet floors.  Keep items that you use a lot in easy-to-reach places.  If you need to reach something above you, use a strong step stool that has a grab bar.  Keep electrical cords out of the way.  Do not use floor polish or wax that makes floors slippery. If you must use wax, use non-skid floor wax.  Do not have throw rugs and other things on the floor that can make you trip. What can I do with my stairs?  Do not leave any items on the stairs.  Make sure that there are handrails on both sides of the stairs and use them. Fix handrails that are broken or loose. Make sure that handrails are as long as the stairways.  Check any carpeting to make sure that it is firmly attached to the stairs. Fix any carpet that is loose or worn.  Avoid having throw rugs at the top or  bottom of the stairs. If you do have throw rugs, attach them to the floor with carpet tape.  Make sure that you have a light switch at the top of the stairs and the bottom of the stairs. If you do not have them, ask someone to add them for you. What else can I do to help prevent falls?  Wear shoes that: ? Do not have high heels. ? Have rubber bottoms. ? Are comfortable and fit you well. ? Are closed at the toe. Do not wear sandals.  If you use a stepladder: ? Make sure that it is fully opened. Do not climb a closed stepladder. ? Make sure that both sides of the stepladder are locked into place. ? Ask someone to hold it for you, if possible.  Clearly mark and make sure that you can see: ? Any grab bars or handrails. ? First and last steps. ? Where the edge of each step is.  Use tools that help you move around (mobility aids) if they are needed. These include: ? Canes. ? Walkers. ? Scooters. ? Crutches.  Turn on the lights when you go into a dark area. Replace any  light bulbs as soon as they burn out.  Set up your furniture so you have a clear path. Avoid moving your furniture around.  If any of your floors are uneven, fix them.  If there are any pets around you, be aware of where they are.  Review your medicines with your doctor. Some medicines can make you feel dizzy. This can increase your chance of falling. Ask your doctor what other things that you can do to help prevent falls. This information is not intended to replace advice given to you by your health care provider. Make sure you discuss any questions you have with your health care provider. Document Released: 05/16/2009 Document Revised: 12/26/2015 Document Reviewed: 08/24/2014 Elsevier Interactive Patient Education  2018 [place] Maintenance, Female Adopting a healthy lifestyle and getting preventive care can go a long way to promote health and wellness. Talk with your health care provider about what schedule of regular examinations is right for you. This is a good chance for you to check in with your provider about disease prevention and staying healthy. In between checkups, there are plenty of things you can do on your own. Experts have done a lot of research about which lifestyle changes and preventive measures are most likely to keep you healthy. Ask your health care provider for more information. Weight and diet Eat a healthy diet  Be sure to include plenty of vegetables, fruits, low-fat dairy products, and lean protein.  Do not eat a lot of foods high in solid fats, added sugars, or salt.  Get regular exercise. This is one of the most important things you can do for your health. ? Most adults should exercise for at least 150 minutes each week. The exercise should increase your heart rate and make you sweat (moderate-intensity exercise). ? Most adults should also do strengthening exercises at least twice a week. This is in addition to the moderate-intensity  exercise.  Maintain a healthy weight  Body mass index (BMI) is a measurement that can be used to identify possible weight problems. It estimates body fat based on height and weight. Your health care provider can help determine your BMI and help you achieve or maintain a healthy weight.  For females 42 years of age and older: ? A BMI below 18.5 is considered  underweight. ? A BMI of 18.5 to 24.9 is normal. ? A BMI of 25 to 29.9 is considered overweight. ? A BMI of 30 and above is considered obese.  Watch levels of cholesterol and blood lipids  You should start having your blood tested for lipids and cholesterol at 82 years of age, then have this test every 5 years.  You may need to have your cholesterol levels checked more often if: ? Your lipid or cholesterol levels are high. ? You are older than 82 years of age. ? You are at high risk for heart disease.  Cancer screening Lung Cancer  Lung cancer screening is recommended for adults 58-59 years old who are at high risk for lung cancer because of a history of smoking.  A yearly low-dose CT scan of the lungs is recommended for people who: ? Currently smoke. ? Have quit within the past 15 years. ? Have at least a 30-pack-year history of smoking. A pack year is smoking an average of one pack of cigarettes a day for 1 year.  Yearly screening should continue until it has been 15 years since you quit.  Yearly screening should stop if you develop a health problem that would prevent you from having lung cancer treatment.  Breast Cancer  Practice breast self-awareness. This means understanding how your breasts normally appear and feel.  It also means doing regular breast self-exams. Let your health care provider know about any changes, no matter how small.  If you are in your 20s or 30s, you should have a clinical breast exam (CBE) by a health care provider every 1-3 years as part of a regular health exam.  If you are 81 or older, have  a CBE every year. Also consider having a breast X-ray (mammogram) every year.  If you have a family history of breast cancer, talk to your health care provider about genetic screening.  If you are at high risk for breast cancer, talk to your health care provider about having an MRI and a mammogram every year.  Breast cancer gene (BRCA) assessment is recommended for women who have family members with BRCA-related cancers. BRCA-related cancers include: ? Breast. ? Ovarian. ? Tubal. ? Peritoneal cancers.  Results of the assessment will determine the need for genetic counseling and BRCA1 and BRCA2 testing.  Cervical Cancer Your health care provider may recommend that you be screened regularly for cancer of the pelvic organs (ovaries, uterus, and vagina). This screening involves a pelvic examination, including checking for microscopic changes to the surface of your cervix (Pap test). You may be encouraged to have this screening done every 3 years, beginning at age 12.  For women ages 67-65, health care providers may recommend pelvic exams and Pap testing every 3 years, or they may recommend the Pap and pelvic exam, combined with testing for human papilloma virus (HPV), every 5 years. Some types of HPV increase your risk of cervical cancer. Testing for HPV may also be done on women of any age with unclear Pap test results.  Other health care providers may not recommend any screening for nonpregnant women who are considered low risk for pelvic cancer and who do not have symptoms. Ask your health care provider if a screening pelvic exam is right for you.  If you have had past treatment for cervical cancer or a condition that could lead to cancer, you need Pap tests and screening for cancer for at least 20 years after your treatment. If Pap tests  have been discontinued, your risk factors (such as having a new sexual partner) need to be reassessed to determine if screening should resume. Some women have  medical problems that increase the chance of getting cervical cancer. In these cases, your health care provider may recommend more frequent screening and Pap tests.  Colorectal Cancer  This type of cancer can be detected and often prevented.  Routine colorectal cancer screening usually begins at 82 years of age and continues through 82 years of age.  Your health care provider may recommend screening at an earlier age if you have risk factors for colon cancer.  Your health care provider may also recommend using home test kits to check for hidden blood in the stool.  A small camera at the end of a tube can be used to examine your colon directly (sigmoidoscopy or colonoscopy). This is done to check for the earliest forms of colorectal cancer.  Routine screening usually begins at age 95.  Direct examination of the colon should be repeated every 5-10 years through 82 years of age. However, you may need to be screened more often if early forms of precancerous polyps or small growths are found.  Skin Cancer  Check your skin from head to toe regularly.  Tell your health care provider about any new moles or changes in moles, especially if there is a change in a mole's shape or color.  Also tell your health care provider if you have a mole that is larger than the size of a pencil eraser.  Always use sunscreen. Apply sunscreen liberally and repeatedly throughout the day.  Protect yourself by wearing long sleeves, pants, a wide-brimmed hat, and sunglasses whenever you are outside.  Heart disease, diabetes, and high blood pressure  High blood pressure causes heart disease and increases the risk of stroke. High blood pressure is more likely to develop in: ? People who have blood pressure in the high end of the normal range (130-139/85-89 mm Hg). ? People who are overweight or obese. ? People who are African American.  If you are 10-33 years of age, have your blood pressure checked every 3-5  years. If you are 63 years of age or older, have your blood pressure checked every year. You should have your blood pressure measured twice-once when you are at a hospital or clinic, and once when you are not at a hospital or clinic. Record the average of the two measurements. To check your blood pressure when you are not at a hospital or clinic, you can use: ? An automated blood pressure machine at a pharmacy. ? A home blood pressure monitor.  If you are between 57 years and 65 years old, ask your health care provider if you should take aspirin to prevent strokes.  Have regular diabetes screenings. This involves taking a blood sample to check your fasting blood sugar level. ? If you are at a normal weight and have a low risk for diabetes, have this test once every three years after 82 years of age. ? If you are overweight and have a high risk for diabetes, consider being tested at a younger age or more often. Preventing infection Hepatitis B  If you have a higher risk for hepatitis B, you should be screened for this virus. You are considered at high risk for hepatitis B if: ? You were born in a country where hepatitis B is common. Ask your health care provider which countries are considered high risk. ? Your parents were  born in a high-risk country, and you have not been immunized against hepatitis B (hepatitis B vaccine). ? You have HIV or AIDS. ? You use needles to inject street drugs. ? You live with someone who has hepatitis B. ? You have had sex with someone who has hepatitis B. ? You get hemodialysis treatment. ? You take certain medicines for conditions, including cancer, organ transplantation, and autoimmune conditions.  Hepatitis C  Blood testing is recommended for: ? Everyone born from [birth year] through 1963. ? Anyone with known risk factors for hepatitis C.  Sexually transmitted infections (STIs)  You should be screened for sexually transmitted infections (STIs) including  gonorrhea and chlamydia if: ? You are sexually active and are younger than 83 years of age. ? You are older than 82 years of age and your health care provider tells you that you are at risk for this type of infection. ? Your sexual activity has changed since you were last screened and you are at an increased risk for chlamydia or gonorrhea. Ask your health care provider if you are at risk.  If you do not have HIV, but are at risk, it may be recommended that you take a prescription medicine daily to prevent HIV infection. This is called pre-exposure prophylaxis (PrEP). You are considered at risk if: ? You are sexually active and do not regularly use condoms or know the HIV status of your partner(s). ? You take drugs by injection. ? You are sexually active with a partner who has HIV.  Talk with your health care provider about whether you are at high risk of being infected with HIV. If you choose to begin PrEP, you should first be tested for HIV. You should then be tested every 3 months for as long as you are taking PrEP. Pregnancy  If you are premenopausal and you may become pregnant, ask your health care provider about preconception counseling.  If you may become pregnant, take 400 to 800 micrograms (mcg) of folic acid every day.  If you want to prevent pregnancy, talk to your health care provider about birth control (contraception). Osteoporosis and menopause  Osteoporosis is a disease in which the bones lose minerals and strength with aging. This can result in serious bone fractures. Your risk for osteoporosis can be identified using a bone density scan.  If you are 38 years of age or older, or if you are at risk for osteoporosis and fractures, ask your health care provider if you should be screened.  Ask your health care provider whether you should take a calcium or vitamin D supplement to lower your risk for osteoporosis.  Menopause may have certain physical symptoms and  risks.  Hormone replacement therapy may reduce some of these symptoms and risks. Talk to your health care provider about whether hormone replacement therapy is right for you. Follow these instructions at home:  Schedule regular health, dental, and eye exams.  Stay current with your immunizations.  Do not use any tobacco products including cigarettes, chewing tobacco, or electronic cigarettes.  If you are pregnant, do not drink alcohol.  If you are breastfeeding, limit how much and how often you drink alcohol.  Limit alcohol intake to no more than 1 drink per day for nonpregnant women. One drink equals 12 ounces of beer, 5 ounces of wine, or 1 ounces of hard liquor.  Do not use street drugs.  Do not share needles.  Ask your health care provider for help if you need support or  information about quitting drugs.  Tell your health care provider if you often feel depressed.  Tell your health care provider if you have ever been abused or do not feel safe at home. This information is not intended to replace advice given to you by your health care provider. Make sure you discuss any questions you have with your health care provider. Document Released: 02/02/2011 Document Revised: 12/26/2015 Document Reviewed: 04/23/2015 Elsevier Interactive Patient Education  2018 [employer].   Hearing Loss Hearing loss is a partial or total loss of the ability to hear. This can be temporary or permanent, and it can happen in one or both ears. Hearing loss may be referred to as deafness. Medical care is necessary to treat hearing loss properly and to prevent the condition from getting worse. Your hearing may partially or completely come back, depending on what caused your hearing loss and how severe it is. In some cases, hearing loss is permanent. What are the causes? Common causes of hearing loss include:  Too much wax in the ear canal.  Infection of the ear canal or middle ear.  Fluid in the  middle ear.  Injury to the ear or surrounding area.  An object stuck in the ear.  Prolonged exposure to loud sounds, such as music.  Less common causes of hearing loss include:  Tumors in the ear.  Viral or bacterial infections, such as meningitis.  A hole in the eardrum (perforated eardrum).  Problems with the hearing nerve that sends signals between the brain and the ear.  Certain medicines.  What are the signs or symptoms? Symptoms of this condition may include:  Difficulty telling the difference between sounds.  Difficulty following a conversation when there is background noise.  Lack of response to sounds in your environment. This may be most noticeable when you do not respond to startling sounds.  Needing to turn up the volume on the television, radio, etc.  Ringing in the ears.  Dizziness.  Pain in the ears.  How is this diagnosed? This condition is diagnosed based on a physical exam and a hearing test (audiometry). The audiometry test will be performed by a hearing specialist (audiologist). You may also be referred to an ear, nose, and throat (ENT) specialist (otolaryngologist). How is this treated? Treatment for recent onset of hearing loss may include:  Ear wax removal.  Being prescribed medicines to prevent infection (antibiotics).  Being prescribed medicines to reduce inflammation (corticosteroids).  Follow these instructions at home:  If you were prescribed an antibiotic medicine, take it as told by your health care provider. Do not stop taking the antibiotic even if you start to feel better.  Take over-the-counter and prescription medicines only as told by your health care provider.  Avoid loud noises.  Return to your normal activities as told by your health care provider. Ask your health care provider what activities are safe for you.  Keep all follow-up visits as told by your health care provider. This is important. Contact a health care  provider if:  You feel dizzy.  You develop new symptoms.  You vomit or feel nauseous.  You have a fever. Get help right away if:  You develop sudden changes in your vision.  You have severe ear pain.  You have new or increased weakness.  You have a severe headache. This information is not intended to replace advice given to you by your health care provider. Make sure you discuss any questions you have with your  health care provider. Document Released: 07/20/2005 Document Revised: 12/26/2015 Document Reviewed: 12/05/2014 Elsevier Interactive Patient Education  2018 [employer].

## 2017-09-30 NOTE — Progress Notes (Signed)
I have personally reviewed the Medicare Annual Wellness Visit and agree with the assessment and plan.  Algis Greenhouse. Jerline Pain, MD 09/30/2017 5:04 PM

## 2017-10-06 ENCOUNTER — Other Ambulatory Visit: Payer: Self-pay

## 2017-10-06 MED ORDER — ATORVASTATIN CALCIUM 20 MG PO TABS: 20.0000 mg | ORAL_TABLET | Freq: Every day | ORAL | 5 refills | Status: DC

## 2017-10-07 ENCOUNTER — Other Ambulatory Visit: Payer: Self-pay | Admitting: Family Medicine

## 2017-11-23 ENCOUNTER — Encounter: Payer: Self-pay | Admitting: Family Medicine

## 2017-11-23 ENCOUNTER — Ambulatory Visit: Payer: Medicare Other | Admitting: Family Medicine

## 2017-11-23 ENCOUNTER — Ambulatory Visit: Payer: Self-pay

## 2017-11-23 ENCOUNTER — Ambulatory Visit (INDEPENDENT_AMBULATORY_CARE_PROVIDER_SITE_OTHER): Payer: Medicare Other

## 2017-11-23 VITALS — BP 142/88 | HR 76 | Temp 97.3°F | Ht 63.0 in | Wt 192.0 lb

## 2017-11-23 DIAGNOSIS — R0602 Shortness of breath: Secondary | ICD-10-CM

## 2017-11-23 DIAGNOSIS — I10 Essential (primary) hypertension: Secondary | ICD-10-CM

## 2017-11-23 DIAGNOSIS — J441 Chronic obstructive pulmonary disease with (acute) exacerbation: Secondary | ICD-10-CM

## 2017-11-23 MED ORDER — DOXYCYCLINE HYCLATE 100 MG PO TABS: 100.0000 mg | ORAL_TABLET | Freq: Two times a day (BID) | ORAL | 0 refills | Status: AC

## 2017-11-23 MED ORDER — PREDNISONE 20 MG PO TABS: ORAL_TABLET | ORAL | 0 refills | Status: DC

## 2017-11-23 NOTE — Patient Instructions (Addendum)
Possible COPD flare up vs pneumonia (waiting on radiology read)  Lets use prednisone and doxycycline (antibiotic) to calm this down  See [place] back if having more fevers, worsening shortness of breath or wheeze or if you had exertional chest pain or pain in back not improving after treatment.

## 2017-11-23 NOTE — Assessment & Plan Note (Signed)
BP Readings from Last 3 Encounters:  11/23/17 (!) 142/88  09/30/17 (!) 142/70  09/03/17 136/68  Blood pressure slightly elevated today but ill.  Continue only bisoprolol for now.  If blood pressure remains up at follow-up may need to increase medication

## 2017-11-23 NOTE — Telephone Encounter (Signed)
noted 

## 2017-11-23 NOTE — Telephone Encounter (Signed)
Pt. C/o cough starting over the weekend. Had a fever over the weekend that came down with Tylenol. States O2 sats have been good. No longer uses O2 at home. Denies any shortness of breath. Does have some back pain and is concerned about getting pneumonia. Appointment made by agent. Instructed to go to ED if symptoms worsen.Verbalizes understanding.  Answer Assessment - Initial Assessment Questions 1. ONSET: "When did the cough begin?"      Started over the weekend 2. SEVERITY: "How bad is the cough today?"      Moderate 3. RESPIRATORY DISTRESS: "Describe your breathing."      No distress 4. FEVER: "Do you have a fever?" If so, ask: "What is your temperature, how was it measured, and when did it start?"     100.9 - came down with Tylenol 5. SPUTUM: "Describe the color of your sputum" (clear, white, yellow, green)     Clear 6. HEMOPTYSIS: "Are you coughing up any blood?" If so ask: "How much?" (flecks, streaks, tablespoons, etc.)     No 7. CARDIAC HISTORY: "Do you have any history of heart disease?" (e.g., heart attack, congestive heart failure)      CVA  4-5 years 8. LUNG HISTORY: "Do you have any history of lung disease?"  (e.g., pulmonary embolus, asthma, emphysema)     COPD 9. PE RISK FACTORS: "Do you have a history of blood clots?" (or: recent major surgery, recent prolonged travel, bedridden )     DVT 10. OTHER SYMPTOMS: "Do you have any other symptoms?" (e.g., runny nose, wheezing, chest pain)       Wheezing 11. PREGNANCY: "Is there any chance you are pregnant?" "When was your last menstrual period?"       No 12. TRAVEL: "Have you traveled out of the country in the last month?" (e.g., travel history, exposures)       No  Protocols used: [place]

## 2017-11-23 NOTE — Telephone Encounter (Signed)
See note

## 2017-11-23 NOTE — Progress Notes (Signed)
Subjective:  [name] is a 84 y.o. year old very pleasant female patient who presents for/with See problem oriented charting ROS-has had congestion, wheeze, mild increased shortness of breath.  No chest pain.  Some left upper back pain.  Did have fever  Past Medical History-  Patient Active Problem List   Diagnosis Date Noted  . Chronic respiratory failure with hypoxia (HCC) 06/15/2017    Priority: High  . Diastolic CHF ([place]) 95/18/8416    Priority: High  . History of cardioembolic stroke 60/63/0160    Priority: High  . Spinal stenosis, lumbar region, with neurogenic claudication 05/09/2014    Priority: High  . History of Focal seizure ([place]) 06/16/2013    Priority: High  . TIA (transient ischemic attack) 05/31/2013    Priority: High  . History of Large B-cell lymphoma ([place]) 02/18/2012    Priority: High  . Atrial fibrillation ([place]) 12/14/2008    Priority: High  . Bacteremia due to Streptococcus 11/21/2015    Priority: Medium  . Cellulitis of leg, right 11/20/2015    Priority: Medium  . Diverticulitis of colon 09/26/2014    Priority: Medium  . CKD (chronic kidney disease), stage III ([place]) 09/26/2014    Priority: Medium  . Laryngeal carcinoma ([place])     Priority: Medium  . Anxiety state 10/20/2008    Priority: Medium  . Depression 10/20/2008    Priority: Medium  . Dyspnea on exertion 07/06/2008    Priority: Medium  . COPD GOLD II 03/26/2008    Priority: Medium  . Hyperlipidemia 03/18/2007    Priority: Medium  . Essential hypertension 03/18/2007    Priority: Medium  . Aortic atherosclerosis ([place]) 06/30/2016    Priority: Low  . Weakness generalized 06/30/2013    Priority: Low  . Bilateral hand pain 01/09/2013    Priority: Low  . Chronic diarrhea 09/04/2017    Medications- reviewed and updated Current Outpatient Medications  Medication Sig Dispense Refill  . acetaminophen (TYLENOL) 650 MG CR tablet Take 650 mg by mouth every 8 (eight) hours as needed for  pain. Reported on 12/09/2015    . atorvastatin (LIPITOR) 20 MG tablet Take 1 tablet (20 mg total) by mouth daily. 30 tablet 5  . atorvastatin (LIPITOR) 20 MG tablet TAKE 1 TABLET BY MOUTH ONCE DAILY 30 tablet 5  . bisoprolol (ZEBETA) 5 MG tablet TAKE ONE TABLET BY MOUTH ONCE DAILY 30 tablet 5  . cholecalciferol (VITAMIN D) 1000 UNITS tablet Take 1,000 Units by mouth daily.    . Cyanocobalamin (VITAMIN B-12 PO) Take 1 tablet by mouth daily.    Marland Kitchen doxycycline (VIBRA-TABS) 100 MG tablet Take 1 tablet (100 mg total) by mouth 2 (two) times daily for 7 days. 14 tablet 0  . furosemide (LASIX) 20 MG tablet Take 1 tablet (20 mg total) by mouth daily as needed for edema. 30 tablet 2  . gabapentin (NEURONTIN) 100 MG capsule TAKE 1 CAPSULE BY MOUTH THREE TIMES DAILY 270 capsule 1  . levETIRAcetam (KEPPRA) 1000 MG tablet Take 1 tablet (1,000 mg total) by mouth 2 (two) times daily. 60 tablet 0  . loperamide (IMODIUM) 2 MG capsule Take 2 mg by mouth daily as needed for diarrhea or loose stools.     Marland Kitchen omeprazole (PRILOSEC) 20 MG capsule Take 20 mg by mouth daily.    . OXYGEN 2lpm with sleep and occ during the day    . predniSONE (DELTASONE) 20 MG tablet Take 1 tablet by mouth daily for 5 days, then  1/2 tablet daily for 2 days 6 tablet 0  . XARELTO 15 MG TABS tablet TAKE 1 TABLET BY MOUTH ONCE DAILY WITH SUPPER 30 tablet 11   No current facility-administered medications for this visit.     Objective: BP (!) 142/88 (BP Location: Left Arm, Patient Position: Sitting, Cuff Size: Normal)   Pulse 76   Temp (!) 97.3 F (36.3 C) (Oral)   Ht 5\' 3"  (1.6 m)   Wt 192 lb (87.1 kg)   SpO2 92%   BMI 34.01 kg/m  Gen: NAD, appears slightly fatigued CV: RRR no murmurs rubs or gallops Lungs: No obvious wheeze or rhonchi.  Slight crackles left lower lobe Abdomen: soft/nontender/nondistended/normal bowel sounds.  Ext: no edema Skin: warm, dry, no rash  Chest x-ray results pending.  My preliminary review did not show any  acute cardiopulmonary findings when compared to last x-ray  Assessment/Plan:  COPD exacerbation (HCC)  Shortness of breath - Plan: DG Chest 2 View S: For a few days-has some mild chest congestion. Coughing up fair amount and hard to catch her breath with those cought spells. Had some shivering and shaking and had temp to 100.9 last night.  Feels slightly more short of breath than normal.  Tylenol brought temperature back down and hadnt recurred. Wheezing more. has noted when she lays down at night a pain in her left upper back. One night it was very sharp in that area.  A/P: Patient is high risk for hospitalization.  She is high risk for delirium when she becomes ill.  For this reason we will be go ahead and be aggressive and treat with both doxycycline and prednisone for potential COPD exacerbation.  I did not see obvious pneumonia on chest x-ray but I did hear some crackles in the left lower lung.  She also had a fever last night so this could be pneumonia-we will await radiology read.  Regardless we will have some coverage with doxycycline.  we can be more aggressive if she is not improving  Essential hypertension BP Readings from Last 3 Encounters:  11/23/17 (!) 142/88  09/30/17 (!) 142/70  09/03/17 136/68  Blood pressure slightly elevated today but ill.  Continue only bisoprolol for now.  If blood pressure remains up at follow-up may need to increase medication   Future Appointments  Date Time Provider [place]  12/24/2017 12:00 PM Ladell Pier, MD [facility] None  01/11/2018  2:30 PM Pieter Partridge, DO [facility] None   Meds ordered this encounter  Medications  . predniSONE (DELTASONE) 20 MG tablet    Sig: Take 1 tablet by mouth daily for 5 days, then 1/2 tablet daily for 2 days    Dispense:  6 tablet    Refill:  0  . doxycycline (VIBRA-TABS) 100 MG tablet    Sig: Take 1 tablet (100 mg total) by mouth 2 (two) times daily for 7 days.    Dispense:  14 tablet    Refill:  0     Return precautions advised.  [name], MD

## 2017-12-13 ENCOUNTER — Telehealth: Payer: Self-pay | Admitting: Neurology

## 2017-12-13 ENCOUNTER — Other Ambulatory Visit: Payer: Self-pay | Admitting: Neurology

## 2017-12-13 NOTE — Telephone Encounter (Signed)
Medication sent to the pharmacy.

## 2017-12-13 NOTE — Telephone Encounter (Signed)
If patient is at the pharmacy, call can be transferred to refill team.  1.     Which medications need to be refilled? (please list name of each medication and dose if know) Keppra 1000 MG. Dosage was changed in the hospital.   2.     Which pharmacy/location (including street and city if local pharmacy) is medication to be sent to? [facility] on [address].  3.     Do they need a 30 or 90 day supply? 90 Day Supply

## 2017-12-24 ENCOUNTER — Inpatient Hospital Stay: Payer: Medicare Other | Attending: Oncology | Admitting: Oncology

## 2018-01-11 ENCOUNTER — Telehealth: Payer: Self-pay | Admitting: Oncology

## 2018-01-11 ENCOUNTER — Other Ambulatory Visit: Payer: Medicare Other

## 2018-01-11 ENCOUNTER — Encounter: Payer: Self-pay | Admitting: Oncology

## 2018-01-11 ENCOUNTER — Encounter: Payer: Self-pay | Admitting: Neurology

## 2018-01-11 ENCOUNTER — Ambulatory Visit (INDEPENDENT_AMBULATORY_CARE_PROVIDER_SITE_OTHER): Payer: Medicare Other | Admitting: Neurology

## 2018-01-11 ENCOUNTER — Inpatient Hospital Stay: Payer: Medicare Other | Attending: Oncology | Admitting: Oncology

## 2018-01-11 VITALS — BP 114/64 | HR 96 | Ht 64.0 in | Wt 190.0 lb

## 2018-01-11 VITALS — BP 142/75 | HR 73 | Temp 98.2°F | Resp 19 | Ht 63.0 in | Wt 190.1 lb

## 2018-01-11 DIAGNOSIS — Z8572 Personal history of non-Hodgkin lymphomas: Secondary | ICD-10-CM | POA: Insufficient documentation

## 2018-01-11 DIAGNOSIS — G40109 Localization-related (focal) (partial) symptomatic epilepsy and epileptic syndromes with simple partial seizures, not intractable, without status epilepticus: Secondary | ICD-10-CM | POA: Diagnosis not present

## 2018-01-11 DIAGNOSIS — Z8521 Personal history of malignant neoplasm of larynx: Secondary | ICD-10-CM | POA: Insufficient documentation

## 2018-01-11 DIAGNOSIS — Z79899 Other long term (current) drug therapy: Secondary | ICD-10-CM

## 2018-01-11 DIAGNOSIS — M48062 Spinal stenosis, lumbar region with neurogenic claudication: Secondary | ICD-10-CM

## 2018-01-11 DIAGNOSIS — I639 Cerebral infarction, unspecified: Secondary | ICD-10-CM

## 2018-01-11 DIAGNOSIS — C8510 Unspecified B-cell lymphoma, unspecified site: Secondary | ICD-10-CM

## 2018-01-11 LAB — CBC
HCT: 37.2 % (ref 35.0–45.0)
Hemoglobin: 12.4 g/dL (ref 11.7–15.5)
MCH: 28.4 pg (ref 27.0–33.0)
MCHC: 33.3 g/dL (ref 32.0–36.0)
MCV: 85.1 fL (ref 80.0–100.0)
MPV: 10.1 fL (ref 7.5–12.5)
PLATELETS: 185 10*3/uL (ref 140–400)
RBC: 4.37 10*6/uL (ref 3.80–5.10)
RDW: 13.4 % (ref 11.0–15.0)
WBC: 6.1 10*3/uL (ref 3.8–10.8)

## 2018-01-11 LAB — COMPREHENSIVE METABOLIC PANEL
AG Ratio: 1.6 (calc) (ref 1.0–2.5)
ALKALINE PHOSPHATASE (APISO): 65 U/L (ref 33–130)
ALT: 14 U/L (ref 6–29)
AST: 15 U/L (ref 10–35)
Albumin: 4 g/dL (ref 3.6–5.1)
BUN / CREAT RATIO: 16 (calc) (ref 6–22)
BUN: 17 mg/dL (ref 7–25)
CHLORIDE: 107 mmol/L (ref 98–110)
CO2: 28 mmol/L (ref 20–32)
Calcium: 9.5 mg/dL (ref 8.6–10.4)
Creat: 1.09 mg/dL — ABNORMAL HIGH (ref 0.60–0.88)
GLOBULIN: 2.5 g/dL (ref 1.9–3.7)
GLUCOSE: 103 mg/dL — AB (ref 65–99)
Potassium: 4.5 mmol/L (ref 3.5–5.3)
Sodium: 143 mmol/L (ref 135–146)
Total Bilirubin: 0.6 mg/dL (ref 0.2–1.2)
Total Protein: 6.5 g/dL (ref 6.1–8.1)

## 2018-01-11 MED ORDER — LAMOTRIGINE 25 MG PO TABS: ORAL_TABLET | ORAL | 0 refills | Status: DC

## 2018-01-11 NOTE — Progress Notes (Signed)
[place] [facility] OFFICE PROGRESS NOTE   Diagnosis: Non-Hodgkin's lymphoma  INTERVAL HISTORY:   Ms. Vannest returns for a scheduled visit.  No fever or palpable lymph nodes.  She reports a poor appetite, but she is eating.  She has frequent bowel movements since undergoing colon surgery.  Objective:  Vital signs in last 24 hours:  Blood pressure (!) 142/75, pulse 73, temperature 98.2 F (36.8 C), temperature source Oral, resp. rate 19, height 5\' 3"  (1.6 m), weight 190 lb 1.6 oz (86.2 kg), SpO2 91 %.    HEENT: Neck without mass Lymphatics: No cervical, supraclavicular, axillary, or inguinal nodes Resp: Lungs clear bilaterally, no respiratory distress Cardio: Regular rate and rhythm GI: No hepatosplenomegaly, no mass, nontender Vascular: No leg edema   Medications: I have reviewed the patient's current medications.   Assessment/Plan:  1. Non-Hodgkin's lymphoma, large B-cell lymphoma, CD20 positive involving 8 needle core biopsy of a right iliac lymph node 02/12/2012. CT of the abdomen and pelvis on 01/15/2012 showed abdominal/pelvic lymphadenopathy. Serum LDH in normal range on 02/25/2012. Staging PET scan 02/25/2012 with a small lymph node posteriolateral to the oropharynx on the left measuring 9 x 7 mm with SUV 5.4; large cluster of hypermetabolic lymph nodes in the right hilum with SUV 4.3; cluster of mildly enlarged hepatoduodenal ligament lymph nodes and mildly enlarged portacaval lymph node with hypermetabolic activity (SUV max 37.1); numerous borderline enlarged and mildly enlarged periaortic retroperitoneal lymph nodes with extensive hypermetabolic activity (SUV max 43.6-47.6); numerous borderline enlarged and mildly enlarged hypermetabolic lymph nodes along the pelvic sidewall bilaterally most pronounced on the right with a right external iliac lymph node measuring 2.9 x 4.1 cm (SUV max 52.5). Bone marrow aspiration/biopsy 03/09/2012 negative for involvement with  lymphoma. Status post cycle 1 of CHOP-Rituxan on 03/15/2012. She completed cycle 3 on 05/04/2012 and cycle 4 on 05/25/2012. Restaging PET scan on 06/10/2012 was markedly improved with resolution of the thoracic and retroperitoneal adenopathy and near complete resolution of porta hepatis adenopathy. She completed cycle 6 of CHOP/Rituxan on 07/06/2012. 2. History of night sweats likely secondary to #1. 3. Rectal bleeding June 2013 status post negative colonoscopy 01/28/2012 and upper endoscopy 02/11/2012. The bleeding was likely related to hemorrhoids. 4. COPD. 5. Remote history of larynx cancer. 6. Status post Port-A-Cath placement 03/09/2012. Removed 09/13/2012. 7. 2-D echo 03/11/2012 with left ventricular ejection fraction 60-65%. 8. Admission with febrile neutropenia 03/22/2012 with no source for infection identified 9. Admission with fever and a chest x-ray consistent with "pneumonia"on 03/27/2012. She completed a course of Levaquin. 10. Pancytopenia following cycle 1 of CHOP-rituximab-resolved. 11. History of atrial fibrillation-maintained on xarelto 12. Numbness/tingling in the fingertips and toes. The numbness in the toes predated the start of chemotherapy.. She also reports the tingling in the fingertips was present prior to the start of chemotherapy. The vincristine was dose reduced with cycle 2. Vincristine was discontinued beginning with cycle 3. 13. Cardioembolic strokes, seizures 2014. She is followed by neurology. 19. Hospitalization with pneumonia November 2014 and April 2017   Disposition: Ms. Pettijohn is in clinical remission from non-Hodgkin's lymphoma.  She has a good prognosis for a long-term disease-free survival now almost 6 years out from diagnosis.  She would like to continue follow-up at the Cancer center.  She will return for an office visit in 1 year.  15 minutes were spent with the patient today.  The majority of the time was used for counseling and coordination of  care.  Betsy Coder, MD  01/11/2018  10:55 AM

## 2018-01-11 NOTE — Patient Instructions (Addendum)
1.  Start lamotrigine 25mg  tablet:   Take 1 tablet at bedtime for 2 weeks  Then 1 tablet in morning and 1 tablet at bedtime for 2 weeks  Then 2 tablets in morning and 2 tablets at bedtime  Contact me if you get a new or unusual rash. 2.  Remain on the [place] for now.  Once you are on lamotrigine 2 tablets twice daily, contact me and we can start tapering off of Keppra 3.  Check routine CBC and CMP 4.  Follow up in 3 months.  Your provider has requested that you have labwork completed today. Please go to [facility] ([id number]) on the second floor of this building before leaving the office today. You do not need to check in. If you are not called within 15 minutes please check with the front desk.

## 2018-01-11 NOTE — Telephone Encounter (Signed)
Scheduled appt per 6/11 los - sent reminder letter in the mail - f/u in 1 year.

## 2018-01-11 NOTE — Progress Notes (Signed)
NEUROLOGY FOLLOW UP OFFICE NOTE  [name] [id number]  HISTORY OF PRESENT ILLNESS: [name] is an 84 year old right-handed woman with history of hypertension, atrial fibrillation, peripheral vascular disease, anxiety, depression, B Cell lymphoma status post chemotherapy, and COPD who follows up for symptomatic seizure secondary to stroke.  She is accompanied by her husband who provides history.   UPDATE: She is taking Keppra 1000mg  twice daily She continues to feel depressed and irritable.     HISTORY: I  SEIZURE DISORDER & CARDIOEMBOLIC STROKE: Since childhood, she has episodes of numbness in either of both hands and inability to get words out.  It lasts for about 2 minutes.  It should be known that she has had these episodes since childhood.  Sometimes it was associated with headache.  She had them about twice a year until young adulthood, when they resolved.    She has had several episodes of confusion and aphasia in October and November of 2014, associated with stroke and presumed seizure.  On 05/26/13, MRI of the brain was performed, which revealed an acute lacunar infarct in the left anterior corona radiata and lentiform nucleus.  CTA of the head revealed atherosclerotic changes in the cavernous carotid arteries without significant stenosis.  CTA of neck revealed focal 50% stenosis of mid right ICA.  She was switched from [name] to Eliquis.  She was readmitted to the hospital on 05/31/13 with recurrent aphasia.  She had trouble getting her words out and then couldn't speak at all.  Repeat MRI of brain revealed new punctate infarct in the left posterior corona radiata, the same location as previous infarct. No changes in management were made.  She was readmitted to the hospital on 06/07/13 for altered mental status.  EEG was performed, showing left fronto-parieto-temporal theta slowing.  Due to possibility of seizure, she was started on Keppra 250mg  BID.  No change made to  Eliquis.  She presented to the ED on 06/16/13 after waking up in the morning with expressive aphasia and right-sided weakness.  Symptoms improved in the ED.  CT head revealed prior lacunar infarct in left lentiform nucleus and corona radiata, but no new abnormalities.  It was suspected that she may have had another seizure and her Keppra was increased to 500mg  BID.  She was admitted to the hospital again on 06/21/13 again for expressive aphasia.  MRI brain revealed 2 new punctate infarcts in the bilateral hemispheres at level of centrum semiovale.  Eliquis was changed to Xarelto. Keppra was increased to 750mg  BID.     She presented to the ED on 08/05/14 for recurrent episode of aphasia, lasting 1.5 hours.  This was more significant than the previous year.  She described it as knowing what she wanted to say but having trouble getting the words out.  She had trouble writing as well.  MRI of brain revealed no acute infarct.  She had an episode two days prior, lasting 1 hour.  Carotid doppler revealed no hemodynamically significant ICA stenosis.     She was admitted to [facility] from 05/01/17 to 05/03/17 for altered mental status and generalized weakness with 2 seizures over the weekend.  She was found by her daughter sitting in the dark moaning with foaming at the mouth and left sided shaking lasting a couple of minutes.  She was altered for much longer time.  CT of head from 04/30/17 and 05/01/17 revealed no acute changes. She was found to have a temperature of  100.3.  She was found to have Klebsiella UTI/cystitis on UA and culture.  She was loaded with Keppra and treated with Cipro.  Keppra was increased to 1000mg  twice daily.  About a week prior, she reportedly had another brief episode of jibberish speech and right facial droop, lasting 2-3 minutes.   She takes Xarelto for secondary stroke prevention, Lipitor for hyperlipidemia, and Zebeta and Lasix for hypertension.   II LUMBAR STENOSIS Both epidural  injections and gabapentin have been ineffective.  She has had worsening back pain with neurogenic claudication.  She is unable to ambulate without a walker.  She reportedly had a repeat MRI of the lumbar spine which showed progression of stenosis and degeneration.  She saw a neurosurgeon, Dr. Ronnald Ramp, who would like to perform surgery.  I did inform them that discontinuing Xarelto for surgery would increase risk for stroke.     III RETINAL MIGRAINES:  Of note, she has longstanding history of migraines, described as scotomas, fortification spectrum and flashing lights in the right eye.  There is no associated headache.  PAST MEDICAL HISTORY: Past Medical History:  Diagnosis Date  . ABDOMINAL INCISIONAL HERNIA 04/03/2010      . Anxiety   . Arthritis    "lots; hands" (05/26/2013)  . Blood transfusion    "w/1st miscarriage" (05/26/2013)  . COPD (chronic obstructive pulmonary disease) ([place])   . Coronary artery disease   . Depression   . Diverticulitis of colon with hemorrhage 07/24/2008  . DVT (deep venous thrombosis) ([place]) 1952   "after I had my first child" (05/26/2013)  . Exertional shortness of breath   . Family history of anesthesia complication    "sisters also get sick as a dog" (05/26/2013)  . GERD 05/03/2007      . GERD (gastroesophageal reflux disease)   . H/O hiatal hernia   . HCAP (healthcare-associated pneumonia) 06/26/2013  . Heart murmur    "when I was a child" (05/26/2013)  . History of TIAs   . Hyperlipidemia   . Hypertension   . 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 Lymphona, mantle cell, inguinal region/lower limb ([place])    "large c-cell" (05/26/2013)  . OSTEOARTHRITIS 05/03/2007  . PEPTIC ULCER DISEASE 07/06/2008  . Pneumonia 2013   "once" (05/26/2013)  . PUD (peptic ulcer disease)   . PVD (peripheral vascular disease) ([place])   . Seizures ([place])   . Stroke ([place]) 05/26/2013   "very mild; affected my speech for a  while" (05/26/2013)  . VARICOSE VEINS LOWER EXTREMITIES W/INFLAMMATION 01/15/2009    MEDICATIONS: Current Outpatient Medications on File Prior to Visit  Medication Sig Dispense Refill  . acetaminophen (TYLENOL) 650 MG CR tablet Take 650 mg by mouth every 8 (eight) hours as needed for pain. Reported on 12/09/2015    . atorvastatin (LIPITOR) 20 MG tablet Take 1 tablet (20 mg total) by mouth daily. 30 tablet 5  . atorvastatin (LIPITOR) 20 MG tablet TAKE 1 TABLET BY MOUTH ONCE DAILY 30 tablet 5  . bisoprolol (ZEBETA) 5 MG tablet TAKE ONE TABLET BY MOUTH ONCE DAILY 30 tablet 5  . cholecalciferol (VITAMIN D) 1000 UNITS tablet Take 1,000 Units by mouth daily.    . Cyanocobalamin (VITAMIN B-12 PO) Take 1 tablet by mouth daily.    . furosemide (LASIX) 20 MG tablet Take 1 tablet (20 mg total) by mouth daily as needed for edema. 30 tablet 2  . gabapentin (NEURONTIN) 100 MG capsule TAKE  1 CAPSULE BY MOUTH THREE TIMES DAILY 270 capsule 1  . levETIRAcetam (KEPPRA) 1000 MG tablet TAKE 1 TABLET BY MOUTH TWICE A DAY 60 tablet 3  . loperamide (IMODIUM) 2 MG capsule Take 2 mg by mouth daily as needed for diarrhea or loose stools.     Marland Kitchen omeprazole (PRILOSEC) 20 MG capsule Take 20 mg by mouth daily.    . OXYGEN 2lpm with sleep and occ during the day    . predniSONE (DELTASONE) 20 MG tablet Take 1 tablet by mouth daily for 5 days, then 1/2 tablet daily for 2 days 6 tablet 0  . XARELTO 15 MG TABS tablet TAKE 1 TABLET BY MOUTH ONCE DAILY WITH SUPPER 30 tablet 11   No current facility-administered medications on file prior to visit.     ALLERGIES: Allergies  Allergen Reactions  . Ativan [Lorazepam] Other (See Comments)    Severe hallucinations   . Propoxyphene Hcl   . Codeine Itching  . Penicillins Rash  . Vancomycin Rash    FAMILY HISTORY: Family History  Problem Relation Age of Onset  . Colon cancer Sister        colon  . Hyperlipidemia Mother   . Stroke Mother   . Cancer Father         mesothelioma    SOCIAL HISTORY: Social History   Socioeconomic History  . Marital status: Married    Spouse name: Not on file  . Number of children: 6  . Years of education: Not on file  . Highest education level: Not on file  Occupational History  . Occupation: retired    Fish farm manager: RETIRED  Social Needs  . Financial resource strain: Not on file  . Food insecurity:    Worry: Not on file    Inability: Not on file  . Transportation needs:    Medical: Not on file    Non-medical: Not on file  Tobacco Use  . Smoking status: Former Smoker    Packs/day: 2.50    Years: 37.00    Pack years: 92.50    Types: Cigarettes    Last attempt to quit: 09/05/1984    Years since quitting: 33.3  . Smokeless tobacco: Never Used  . Tobacco comment: quit when she was dx with cancer  Substance and Sexual Activity  . Alcohol use: No    Alcohol/week: 0.0 oz  . Drug use: No  . Sexual activity: Never  Lifestyle  . Physical activity:    Days per week: Not on file    Minutes per session: Not on file  . Stress: Not on file  Relationships  . Social connections:    Talks on phone: Not on file    Gets together: Not on file    Attends religious service: Not on file    Active member of club or organization: Not on file    Attends meetings of clubs or organizations: Not on file    Relationship status: Not on file  . Intimate partner violence:    Fear of current or ex partner: Not on file    Emotionally abused: Not on file    Physically abused: Not on file    Forced sexual activity: Not on file  Other Topics Concern  . Not on file  Social History Narrative   Married (husband patient of Dr. Yong Channel). 6 children (lost 2 for total 8). 15 grandkids. 6 greatgrandchildren.    Lives with husband. Husband drives. Patient does not drive.  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     REVIEW OF SYSTEMS: Constitutional: No fevers, chills, or  sweats, no generalized fatigue, change in appetite Eyes: No visual changes, double vision, eye pain Ear, nose and throat: No hearing loss, ear pain, nasal congestion, sore throat Cardiovascular: No chest pain, palpitations Respiratory:  No shortness of breath at rest or with exertion, wheezes GastrointestinaI: No nausea, vomiting, diarrhea, abdominal pain, fecal incontinence Genitourinary:  No dysuria, urinary retention or frequency Musculoskeletal:  Back pain Integumentary: No rash, pruritus, skin lesions Neurological: as above Psychiatric: depression Endocrine: No palpitations, fatigue, diaphoresis, mood swings, change in appetite, change in weight, increased thirst Hematologic/Lymphatic:  No purpura, petechiae. Allergic/Immunologic: no itchy/runny eyes, nasal congestion, recent allergic reactions, rashes  PHYSICAL EXAM: BP 114/64; Pulse 96; Wt 190 lb; Ht 5'4"; SpO2 91% General: No acute distress.  Patient appears well-groomed.   Head:  Normocephalic/atraumatic Eyes:  Fundi examined but not visualized Neck: supple, no paraspinal tenderness, full range of motion Heart:  Regular rate and rhythm Lungs:  Clear to auscultation bilaterally Back: No paraspinal tenderness Neurological Exam: alert and oriented to person, place, and time. Attention span and concentration intact, recent and remote memory intact, fund of knowledge intact.  Speech fluent and not dysarthric, language intact.  CN II-XII intact. Bulk and tone normal, muscle strength 5/5 throughout.  Sensation to light touch  intact.  Deep tendon reflexes 2+ throughout.  Finger to nose testing with postural and kinetic tremor.  Unsteady wide-based stance.  Cautious gait.  IMPRESSION: 1.  Localization-related symptomatic epilepsy. 2.  History of cardioembolic stroke 3.  Lumbar stenosis 4.  Mood changes.  She may be just experiencing depression related to the death of her daughter, her chronic back pain and loss of some independence  (such as driving).  However, it may be side effect of Keppra as well.  PLAN: 1.  Plan is to switch from [place] to [name] in order to address irritability:  Start lamotrigine 25mg  at bedtime for 2 weeks, then 25mg  twice daily for 2 weeks, then 50mg  twice daily (check routine CBC and CMP) 2.  Once she is on lamotrigine 50mg  twice daily, we will taper off of Keppra 3.  Follow up in 3 months   22 minutes spent face to face with patient, over 50% spent discussing management.  Metta Clines, DO  CC:  Dr. Yong Channel

## 2018-02-05 ENCOUNTER — Other Ambulatory Visit: Payer: Self-pay | Admitting: Neurology

## 2018-02-07 ENCOUNTER — Telehealth: Payer: Self-pay | Admitting: Neurology

## 2018-02-07 MED ORDER — LAMOTRIGINE 100 MG PO TABS: 100.0000 mg | ORAL_TABLET | Freq: Two times a day (BID) | ORAL | 1 refills | Status: DC

## 2018-02-07 NOTE — Telephone Encounter (Signed)
Patients husband calling requesting to speak with nurse again regarding this.

## 2018-02-07 NOTE — Telephone Encounter (Signed)
Called and spoke with [name], advised him of additional increase in lamotrigine. I advised him we are sending in a new Rx. He is to call me back when Pt reaches 100 mg BID, for instructions of tapering off Keppra

## 2018-02-07 NOTE — Telephone Encounter (Signed)
I actually would like to increase the [name] to goal of 100mg  twice daily.  After she is on 50mg  (two 25mg ) twice daily for a week, we can increase to 100mg  twice daily (we will need to send a new prescription).  Once she starts 100mg  twice daily, then she may taper off Keppra.  At that time, she may decrease dose to 500mg  twice daily (may cut the 1000mg  pill in half) for one week, then once daily for one week, then stop.

## 2018-02-07 NOTE — Telephone Encounter (Signed)
Called and spoke with husband, [name]. Starting Thurs 02/10/18, Pt will be taking 50 mg (two 25mg ) [name], he wants to know how to taper off the Keppra.

## 2018-02-07 NOTE — Telephone Encounter (Signed)
Patient's husband called needing to know how he needs to taper her off of her Keppra? Please Call. Thanks

## 2018-02-10 ENCOUNTER — Telehealth: Payer: Self-pay | Admitting: Neurology

## 2018-02-10 NOTE — Telephone Encounter (Signed)
Husband called and would like to speak with you regarding his wife and her medication. Please Call. Thanks

## 2018-02-10 NOTE — Telephone Encounter (Signed)
Called and spoke with Eddie. We went over increasing the lamictal again. I sent in an Rx (on 02/07/18) for the 100mg  to be available once she has been on the 50 mg for one week.  He now understands to call me once she reaches 100mg , and we will taper off the Keppra.

## 2018-02-16 ENCOUNTER — Telehealth: Payer: Self-pay

## 2018-02-16 NOTE — Telephone Encounter (Signed)
We are switching the Keppra to [name] to see if it helps her mood.  She has depression which would best be addressed by her pcp rather than neurology

## 2018-02-16 NOTE — Telephone Encounter (Signed)
Rcvd VM from Arnold, Pt's husband. Requesting call back about how to stop the Keppra. Called and spoke with Eddie. Went over the [place] dosing again. He had forgotten about the 100mg  that he picked up and Pt has been taking 50 mg BID, he didn't increase to 100mg .  Advised him starting tomorrow to start the 100mg  tablets BID, and can now cut the 1000mg  tablets of Keppra in half and give the Pt 500mg  BID x 1 week, then 500mg  QD x 1 week, then stop. He repeated it back to me.   He and their children are concerned about how his wife is acting. She is not herself, being snappy  They would like her to be seen before September.

## 2018-02-17 ENCOUNTER — Telehealth: Payer: Self-pay | Admitting: Neurology

## 2018-02-17 NOTE — Telephone Encounter (Signed)
Called and spoke with [name], we again went over [name] instructions, and how to stop the [place].  I advised him to contact the PCP concerning depression.

## 2018-02-17 NOTE — Telephone Encounter (Signed)
Husband called and would like to speak with you regarding his wife and medication. Please Call. Thanks

## 2018-02-22 ENCOUNTER — Encounter: Payer: Self-pay | Admitting: Family Medicine

## 2018-02-22 ENCOUNTER — Ambulatory Visit: Payer: Medicare Other | Admitting: Family Medicine

## 2018-02-22 ENCOUNTER — Ambulatory Visit (INDEPENDENT_AMBULATORY_CARE_PROVIDER_SITE_OTHER): Payer: Medicare Other

## 2018-02-22 ENCOUNTER — Other Ambulatory Visit: Payer: Medicare Other

## 2018-02-22 VITALS — BP 132/86 | HR 70 | Temp 97.8°F | Ht 64.0 in | Wt 192.8 lb

## 2018-02-22 DIAGNOSIS — R05 Cough: Secondary | ICD-10-CM

## 2018-02-22 DIAGNOSIS — R3589 Other polyuria: Secondary | ICD-10-CM

## 2018-02-22 DIAGNOSIS — R358 Other polyuria: Secondary | ICD-10-CM | POA: Diagnosis not present

## 2018-02-22 DIAGNOSIS — R531 Weakness: Secondary | ICD-10-CM | POA: Diagnosis not present

## 2018-02-22 DIAGNOSIS — R059 Cough, unspecified: Secondary | ICD-10-CM

## 2018-02-22 DIAGNOSIS — I5032 Chronic diastolic (congestive) heart failure: Secondary | ICD-10-CM | POA: Diagnosis not present

## 2018-02-22 LAB — COMPREHENSIVE METABOLIC PANEL
ALBUMIN: 4.1 g/dL (ref 3.5–5.2)
ALT: 15 U/L (ref 0–35)
AST: 13 U/L (ref 0–37)
Alkaline Phosphatase: 68 U/L (ref 39–117)
BILIRUBIN TOTAL: 0.7 mg/dL (ref 0.2–1.2)
BUN: 12 mg/dL (ref 6–23)
CALCIUM: 9.1 mg/dL (ref 8.4–10.5)
CO2: 29 meq/L (ref 19–32)
CREATININE: 1.31 mg/dL — AB (ref 0.40–1.20)
Chloride: 103 mEq/L (ref 96–112)
GFR: 40.73 mL/min — ABNORMAL LOW (ref >60.00)
Glucose, Bld: 93 mg/dL (ref 70–99)
Potassium: 4.7 mEq/L (ref 3.5–5.1)
SODIUM: 140 meq/L (ref 135–145)
Total Protein: 7.1 g/dL (ref 6.0–8.3)

## 2018-02-22 LAB — CBC WITH DIFFERENTIAL/PLATELET
BASOS ABS: 0 10*3/uL (ref 0.0–0.1)
Basophils Relative: 0.6 % (ref 0.0–3.0)
EOS ABS: 0.2 10*3/uL (ref 0.0–0.7)
Eosinophils Relative: 3.2 % (ref 0.0–5.0)
HEMATOCRIT: 37.5 % (ref 36.0–46.0)
Hemoglobin: 12.3 g/dL (ref 12.0–15.0)
LYMPHS PCT: 26.7 % (ref 12.0–46.0)
Lymphs Abs: 1.6 10*3/uL (ref 0.7–4.0)
MCHC: 32.9 g/dL (ref 30.0–36.0)
MCV: 87.8 fl (ref 78.0–100.0)
MONO ABS: 0.5 10*3/uL (ref 0.1–1.0)
Monocytes Relative: 9 % (ref 3.0–12.0)
Neutro Abs: 3.6 10*3/uL (ref 1.4–7.7)
Neutrophils Relative %: 60.5 % (ref 43.0–77.0)
Platelets: 194 10*3/uL (ref 150.0–400.0)
RBC: 4.27 Mil/uL (ref 3.87–5.11)
RDW: 14.8 % (ref 11.5–15.5)
WBC: 6 10*3/uL (ref 4.0–10.5)

## 2018-02-22 LAB — BRAIN NATRIURETIC PEPTIDE: PRO B NATRI PEPTIDE: 315 pg/mL — AB (ref 0.0–100.0)

## 2018-02-22 LAB — POC URINALSYSI DIPSTICK (AUTOMATED)
BILIRUBIN UA: NEGATIVE
Blood, UA: NEGATIVE
Glucose, UA: NEGATIVE
KETONES UA: NEGATIVE
LEUKOCYTES UA: NEGATIVE
Nitrite, UA: NEGATIVE
PROTEIN UA: POSITIVE — AB
Spec Grav, UA: 1.015 (ref 1.010–1.025)
Urobilinogen, UA: 0.2 E.U./dL
pH, UA: 6.5 (ref 5.0–8.0)

## 2018-02-22 NOTE — Patient Instructions (Addendum)
We are investigating cause of cough, frequent urine, weakness with labs and x-ray today.   Please see [place] back immediately for new or worsening symptoms.   Please stop by the lab before you go.

## 2018-02-22 NOTE — Addendum Note (Signed)
Addended by: Kayren Eaves T on: 02/22/2018 03:43 PM   Modules accepted: Orders

## 2018-02-22 NOTE — Progress Notes (Signed)
Subjective:  [name] is a 84 y.o. year old very pleasant female patient who presents for/with See problem oriented charting ROS- some increased edema. Increased weakness. No chest pain. No increased SOb from baseline.    Past Medical History-  Patient Active Problem List   Diagnosis Date Noted  . Chronic respiratory failure with hypoxia (HCC) 06/15/2017    Priority: High  . Diastolic CHF ([place]) 52/77/8242    Priority: High  . History of cardioembolic stroke 35/36/1443    Priority: High  . Spinal stenosis, lumbar region, with neurogenic claudication 05/09/2014    Priority: High  . History of Focal seizure ([place]) 06/16/2013    Priority: High  . TIA (transient ischemic attack) 05/31/2013    Priority: High  . History of Large B-cell lymphoma ([place]) 02/18/2012    Priority: High  . Atrial fibrillation ([place]) 12/14/2008    Priority: High  . Bacteremia due to Streptococcus 11/21/2015    Priority: Medium  . Cellulitis of leg, right 11/20/2015    Priority: Medium  . Diverticulitis of colon 09/26/2014    Priority: Medium  . CKD (chronic kidney disease), stage III ([place]) 09/26/2014    Priority: Medium  . Laryngeal carcinoma ([place])     Priority: Medium  . Anxiety state 10/20/2008    Priority: Medium  . Depression 10/20/2008    Priority: Medium  . Dyspnea on exertion 07/06/2008    Priority: Medium  . COPD GOLD II 03/26/2008    Priority: Medium  . Hyperlipidemia 03/18/2007    Priority: Medium  . Essential hypertension 03/18/2007    Priority: Medium  . Chronic diarrhea 09/04/2017    Priority: Low  . Aortic atherosclerosis ([place]) 06/30/2016    Priority: Low  . Weakness generalized 06/30/2013    Priority: Low  . Bilateral hand pain 01/09/2013    Priority: Low    Medications- reviewed and updated Current Outpatient Medications  Medication Sig Dispense Refill  . acetaminophen (TYLENOL) 650 MG CR tablet Take 650 mg by mouth every 8 (eight) hours as needed for pain. Reported  on 12/09/2015    . atorvastatin (LIPITOR) 20 MG tablet Take 1 tablet (20 mg total) by mouth daily. 30 tablet 5  . atorvastatin (LIPITOR) 20 MG tablet TAKE 1 TABLET BY MOUTH ONCE DAILY 30 tablet 5  . bisoprolol (ZEBETA) 5 MG tablet TAKE ONE TABLET BY MOUTH ONCE DAILY 30 tablet 5  . cholecalciferol (VITAMIN D) 1000 UNITS tablet Take 1,000 Units by mouth daily.    . Cyanocobalamin (VITAMIN B-12 PO) Take 1 tablet by mouth daily.    . furosemide (LASIX) 20 MG tablet Take 1 tablet (20 mg total) by mouth daily as needed for edema. 30 tablet 2  . gabapentin (NEURONTIN) 100 MG capsule TAKE 1 CAPSULE BY MOUTH THREE TIMES DAILY 270 capsule 1  . lamoTRIgine ([name]) 100 MG tablet Take 1 tablet (100 mg total) by mouth 2 (two) times daily. 60 tablet 1  . levETIRAcetam (KEPPRA) 1000 MG tablet TAKE 1 TABLET BY MOUTH TWICE A DAY 60 tablet 3  . loperamide (IMODIUM) 2 MG capsule Take 2 mg by mouth daily as needed for diarrhea or loose stools.     Marland Kitchen omeprazole (PRILOSEC) 20 MG capsule Take 20 mg by mouth daily.    . OXYGEN 2lpm with sleep and occ during the day    . predniSONE (DELTASONE) 20 MG tablet Take 1 tablet by mouth daily for 5 days, then 1/2 tablet daily for 2 days 6 tablet 0  .  XARELTO 15 MG TABS tablet TAKE 1 TABLET BY MOUTH ONCE DAILY WITH SUPPER 30 tablet 11   No current facility-administered medications for this visit.     Objective: BP 132/86 (BP Location: Left Arm, Patient Position: Sitting, Cuff Size: Normal)   Pulse 70   Temp 97.8 F (36.6 C) (Oral)   Ht 5\' 4"  (1.626 m)   Wt 192 lb 12.8 oz (87.5 kg)   SpO2 93%   BMI 33.09 kg/m  Gen: NAD, appears fatigued Oropharynx normal, nares normal. TM unable to be viewed due to cerumen.  CV: RRR no murmurs rubs or gallops Lungs: no crackles, rhonchi. Rare slight wheeze Abdomen: soft/nontender/nondistended/normal bowel sounds.  Ext: trace to 1+ edema Skin: warm, dry Neuro: less talkative than normal, seems subdued  Assessment/Plan:  Cough -  Plan: DG Chest 2 View  Polyuria - Plan: POCT Urinalysis Dipstick (Automated), Urine Culture  Generalized weakness - Plan: CBC with Differential/Platelet, Comprehensive metabolic panel, DG Chest 2 View, B Nat Peptide  Chronic diastolic congestive heart failure (HCC) - Plan: B Nat Peptide S: Patient is in process of being weaned off 1 seizure medicine and being placed onto new medicine and this transition should be finished this week. They called neurology Dr. Tomi Likens with concern about possible side effects of medicine- they were encouraged to come to PCP office for evaluation.   She states she has had Generalized weakness. Seems less balanced on her feet. She had a fall in a store recently- gently went down to the floor- no syncope.   Over last 3 days. irritability slightly worse in this time but always has irritability- More anger noted in last few days.  Ankles have more swollen for 2-3 days.  Weight up a few lbs. Some wheezeOverall she feels weaker. Less strength to talk in general/quieter but still finds strength to yell if gets upset. Sleeping more. At baseline level of shortness of breath. Has been a few months since lasix- this is the first time she has swollen at all.   Cough worse for last 3 weeks- has had chronic cough for 6 months they think. A lot more phlegm compared to usual. Seems more shaky. More diarrhea than usual.   No burning with peeing. . Feels urinating more frequently and more incontinence. Back pain had bene slightly worse but that has resolved since they made visit A/P: 84 year old female with generalized weakness, polyuria, increased edema, cough. We will get CXR to rule out pneumonia and evaluate for interstitial edema. Will get labs as below to look for cause of symptoms such as infection, electrolyte imbalance, CHF exacerbation.   With slight increased edema and weight gain considering restarting her lasix. Also with cough over last 6 months- considering prednisone  given COPD as well as antibiotic such as doxycycline.   Future Appointments  Date Time Provider [place]  04/26/2018 10:50 AM Pieter Partridge, DO [facility] None  01/12/2019 10:45 AM Owens Shark, NP [facility] None   Lab/Order associations: Cough - Plan: DG Chest 2 View  Polyuria - Plan: POCT Urinalysis Dipstick (Automated), Urine Culture  Generalized weakness - Plan: CBC with Differential/Platelet, Comprehensive metabolic panel, DG Chest 2 View, B Nat Peptide  Chronic diastolic congestive heart failure (HCC) - Plan: B Nat Peptide  Return precautions advised.  [name], MD

## 2018-02-23 ENCOUNTER — Other Ambulatory Visit: Payer: Self-pay

## 2018-02-23 LAB — URINE CULTURE
MICRO NUMBER: 90870341
SPECIMEN QUALITY:: ADEQUATE

## 2018-02-23 MED ORDER — FUROSEMIDE 20 MG PO TABS: 20.0000 mg | ORAL_TABLET | Freq: Every day | ORAL | 1 refills | Status: DC

## 2018-02-24 NOTE — Telephone Encounter (Signed)
Patient's husband called and said that she is "very out of it completely". Should he keep going like it's going? He said she is very paranoid. Please Call. Thanks

## 2018-02-24 NOTE — Telephone Encounter (Signed)
Called and spoke with Eddie. He states Pt seems more confused in the last 2-3 days. Pt saw her PCP last week and had labs and U/A. Nothing was abnormal that he is aware of. He states he noticed vaginal bleeding this morning. I advised him to contact the PCP. He said he will , or call GYN.

## 2018-03-02 ENCOUNTER — Telehealth: Payer: Self-pay | Admitting: *Deleted

## 2018-03-02 NOTE — Telephone Encounter (Signed)
Thanks Cassie. I will see her when I get back- she can certainly see one of my colleagues if needed with any worsening symptoms. Will also cc Dr. Tomi Likens as an [facility] [place].
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
## 2018-03-03 ENCOUNTER — Telehealth: Payer: Self-pay | Admitting: Neurology

## 2018-03-03 NOTE — Telephone Encounter (Signed)
[name], in case her symptoms are related to the lamictal, we will taper off the lamictal and restart Keppra.  [name]:  Take 50mg  in AM and 100mg  at bedtime for 1 week, then 50mg  twice daily for 3 days, then 50mg  daily for 3 days, then stop. Keppra:  At same time, start 500mg  twice daily for 1 week, then 1000mg  twice daily.

## 2018-03-03 NOTE — Telephone Encounter (Addendum)
I did offer an acute visit with one of our other providers. However, the daughter wished to only see Dr Yong Channel since he knows her the best. She stated that she would call the office to schedule a sooner appointment if she changed her mind.

## 2018-03-03 NOTE — Telephone Encounter (Signed)
Patient's daughter [name] called. She brings her Mother to the appointments. She is very scared for her mother and father. Her mother has become very Paranoid and very angry. She will not let her husband call into the office. She needs to have someone call. I did explain that she is not listed on the DPR. She said her mother gets so angry that she is afraid she will have a "heart attack or stroke" Please Advise. Thank you

## 2018-03-04 ENCOUNTER — Telehealth: Payer: Self-pay | Admitting: Neurology

## 2018-03-04 NOTE — Telephone Encounter (Signed)
Called and spoke with Eddie. He wrote down the instructions for tapering off [name], and for restarting Keppra. He will call back with any further questions.

## 2018-03-04 NOTE — Telephone Encounter (Signed)
Pt's husband returned your call , pls call him again.

## 2018-03-04 NOTE — Telephone Encounter (Signed)
Rcvd after hours phone message from 03/03/18 (4:36pm) Pts daughter, Andi Layfield asking for return call at: [phone]. Message states they had called yesterday with an urgent message and call was not returned.  She had called yesterday and was advised we are unable to talk to her, she is not on the [facility]. We asked that Percell Miller (husband) call.  Called and spoke with [place]. I advised her again, we are unable to discuss with her. She said she does not want to discuss her mother's information, she just needs help with what is going on with her.  She is to have Percell Miller [employer]) call.

## 2018-03-07 ENCOUNTER — Other Ambulatory Visit: Payer: Self-pay | Admitting: Neurology

## 2018-03-07 ENCOUNTER — Encounter: Payer: Self-pay | Admitting: Family Medicine

## 2018-03-07 ENCOUNTER — Ambulatory Visit: Payer: Medicare Other | Admitting: Family Medicine

## 2018-03-07 VITALS — BP 154/92 | HR 81 | Temp 98.8°F | Ht 64.0 in

## 2018-03-07 DIAGNOSIS — S0990XA Unspecified injury of head, initial encounter: Secondary | ICD-10-CM

## 2018-03-07 DIAGNOSIS — I5032 Chronic diastolic (congestive) heart failure: Secondary | ICD-10-CM

## 2018-03-07 DIAGNOSIS — R531 Weakness: Secondary | ICD-10-CM | POA: Diagnosis not present

## 2018-03-07 DIAGNOSIS — W19XXXA Unspecified fall, initial encounter: Secondary | ICD-10-CM | POA: Diagnosis not present

## 2018-03-07 DIAGNOSIS — I10 Essential (primary) hypertension: Secondary | ICD-10-CM

## 2018-03-07 MED ORDER — DOXYCYCLINE HYCLATE 100 MG PO TABS: 100.0000 mg | ORAL_TABLET | Freq: Two times a day (BID) | ORAL | 0 refills | Status: AC

## 2018-03-07 NOTE — Patient Instructions (Addendum)
Start doxycycline 100mg  twice a day for 7 days. Possible respiratory infection causing increased confusion/anger  If you dont hear about your CT scan within 2 days - send me a mychart messages  Use your walker at all times  [name]:  Take 50mg  in AM and 100mg  at bedtime for 1 week, then 50mg  twice daily for 3 days, then 50mg  daily for 3 days, then stop.  Keppra:  At same time, start 500mg  twice daily for 1 week, then 1000mg  twice daily.  Schedule for 30 minute visit at 58 45 on Friday. We may do some additional bloodwork if no improvement by that time.

## 2018-03-07 NOTE — Progress Notes (Signed)
Subjective:  [name] is a 84 y.o. year old very pleasant female patient who presents for/with See problem oriented charting ROS- patient has continued cough and sputum production. No fever. Essentially baseline shortness of breath- oxygen at home 92% or above. Falls including fall hitting head- denies continued headaches.    Past Medical History-  Patient Active Problem List   Diagnosis Date Noted  . Chronic respiratory failure with hypoxia (HCC) 06/15/2017    Priority: High  . Diastolic CHF ([place]) 35/57/3220    Priority: High  . History of cardioembolic stroke 25/42/7062    Priority: High  . Spinal stenosis, lumbar region, with neurogenic claudication 05/09/2014    Priority: High  . History of Focal seizure ([place]) 06/16/2013    Priority: High  . TIA (transient ischemic attack) 05/31/2013    Priority: High  . History of Large B-cell lymphoma ([place]) 02/18/2012    Priority: High  . Atrial fibrillation ([place]) 12/14/2008    Priority: High  . Bacteremia due to Streptococcus 11/21/2015    Priority: Medium  . Cellulitis of leg, right 11/20/2015    Priority: Medium  . Diverticulitis of colon 09/26/2014    Priority: Medium  . CKD (chronic kidney disease), stage III ([place]) 09/26/2014    Priority: Medium  . Laryngeal carcinoma ([place])     Priority: Medium  . Anxiety state 10/20/2008    Priority: Medium  . Depression 10/20/2008    Priority: Medium  . Dyspnea on exertion 07/06/2008    Priority: Medium  . COPD GOLD II 03/26/2008    Priority: Medium  . Hyperlipidemia 03/18/2007    Priority: Medium  . Essential hypertension 03/18/2007    Priority: Medium  . Chronic diarrhea 09/04/2017    Priority: Low  . Aortic atherosclerosis ([place]) 06/30/2016    Priority: Low  . Weakness generalized 06/30/2013    Priority: Low  . Bilateral hand pain 01/09/2013    Priority: Low    Medications- reviewed and updated Current Outpatient Medications  Medication Sig Dispense Refill  .  atorvastatin (LIPITOR) 20 MG tablet TAKE 1 TABLET BY MOUTH ONCE DAILY 30 tablet 5  . bisoprolol (ZEBETA) 5 MG tablet TAKE ONE TABLET BY MOUTH ONCE DAILY 30 tablet 5  . cholecalciferol (VITAMIN D) 1000 UNITS tablet Take 1,000 Units by mouth daily.    . Cyanocobalamin (VITAMIN B-12 PO) Take 1 tablet by mouth daily.    Marland Kitchen doxycycline (VIBRA-TABS) 100 MG tablet Take 1 tablet (100 mg total) by mouth 2 (two) times daily for 7 days. 14 tablet 0  . furosemide (LASIX) 20 MG tablet Take 1 tablet (20 mg total) by mouth daily for 5 days. 5 tablet 1  . gabapentin (NEURONTIN) 100 MG capsule TAKE ONE CAPSULE BY MOUTH 3 TIMES A DAY 270 capsule 1  . lamoTRIgine ([name]) 100 MG tablet Take 1 tablet (100 mg total) by mouth 2 (two) times daily. 60 tablet 1  . levETIRAcetam (KEPPRA) 1000 MG tablet TAKE 1 TABLET BY MOUTH TWICE A DAY 60 tablet 3  . loperamide (IMODIUM) 2 MG capsule Take 2 mg by mouth daily as needed for diarrhea or loose stools.     Marland Kitchen omeprazole (PRILOSEC) 20 MG capsule Take 20 mg by mouth daily.    . OXYGEN 2lpm with sleep and occ during the day    . predniSONE (DELTASONE) 20 MG tablet Take 1 tablet by mouth daily for 5 days, then 1/2 tablet daily for 2 days 6 tablet 0  . XARELTO 15 MG  TABS tablet TAKE 1 TABLET BY MOUTH ONCE DAILY WITH SUPPER 30 tablet 11   No current facility-administered medications for this visit.     Objective: BP (!) 154/92 (BP Location: Left Arm, Patient Position: Sitting, Cuff Size: Normal)   Pulse 81   Temp 98.8 F (37.1 C) (Oral)   Ht 5\' 4"  (1.626 m)   BMI 33.09 kg/m  Gen: NAD, intermittent cough CV: RRR no murmurs rubs or gallops Lungs: CTAB no crackles, wheeze, rhonchi Abdomen: soft/nontender/nondistended/normal bowel sounds.  Ext: trace edema Skin: warm, dry Neuro: walks with 4 wheel walker, sits in tis during visit Easily agitated- even above her baseline.   Assessment/Plan:  Cough/congestion Hallucinations S:  saw her 2 mondays ago with generalized  weakness, polyuria, increased edema, cough. CXR without obvious pneumonia at that time. Labs including urine without obvious source of infection. BNP up slightly but no obvious CHF exacerbation- we had her restart home lasix to see if fluid overload contributing to symptoms.   Family called back on Thursday asking to speak to a nurse. Family states symptoms were worsening- she called in and was told would be called back within 2 days- no phone call listed.   Fell hard over weekend hitting a wall with a thin layer of concrete and she broke throught this with her head- anger worsened even more after this it seemed. She had no bruising or bleeding after fall and no headache. 4 falls in 10 days  Sometime since last visit with me-  Started being delusional and seeing things- like rodents or moles- they only come out to talk to her. Saw a little boy in her garden, little girls getting dropped off.   Family thinks confusion/mentation has had a significant change since going onto lamictal over keppra though this was done to try to help with her agitation at baseline. Seems to be a big change overall since medication changes. They also worry about dementia. 90 degree turn per family at least with changing to [place] had anger/but intensified  Cough and phlegm production are not better. Hoarse when talking. Oxygen levels at home have been at home- was out in the heat.  A/P: I am unclear of cause of hallucinations. Possible delirium from respiratory infection? Will cover with doxycycline for 7 days. WIth fall- will get CT to rule out subdural hematoma. Given falls encouraged her to use walker at all times. Given falls and generalized weakness- will refer to home health for PT/OT. Will also switch back to keppra per taper Dr. Tomi Likens gave. From avs "[name]:  Take 50mg  in AM and 100mg  at bedtime for 1 week, then 50mg  twice daily for 3 days, then 50mg  daily for 3 days, then stop.  Keppra:  At same time,  start 500mg  twice daily for 1 week, then 1000mg  twice daily.  Schedule for 30 minute visit at 33 45 on Friday. We may do some additional bloodwork if no improvement by that time. "  Essential hypertension S: controlled poorly on bisoprolol 5mg  BP Readings from Last 3 Encounters:  03/07/18 (!) 154/92  02/22/18 132/86  01/11/18 114/64  A/P:prefer blood pressure goal of <140/90. Continue current meds: for now- she is agitated and doesn't feel well today so perhaps this will improve at time of close follow up within 2 weeks  Diastolic CHF ([place]) S: lasix for 5 days helped- edema and shortness of breath A/P: doesn't appear particularly fluid overloaded today. Patient refused weight today. We will hopefully be able to get  weight at last visit and if worsening respiratory status - consider lasix restart long term (could also help with BP). Plan prior had been use prn for weight gain or edema only but we may perhaps plan to use MWF and then more regularly if weight gain or edema if weight trending up, edema worsening at follow up or other signs of fluid overload.   Future Appointments  Date Time Provider [place]  03/11/2018  8:45 AM Marin Olp, MD [facility]  04/26/2018 10:50 AM Pieter Partridge, DO [facility] None  01/12/2019 10:45 AM Owens Shark, NP [facility] None   Follow up this week  Lab/Order associations: Traumatic injury of head, initial encounter - Plan: CT Head Wo Contrast, Ambulatory referral to [place]  Generalized weakness - Plan: Ambulatory referral to [place], initial encounter - Plan: Ambulatory referral to [place] hypertension  Chronic diastolic congestive heart failure ([place])  Meds ordered this encounter  Medications  . doxycycline (VIBRA-TABS) 100 MG tablet    Sig: Take 1 tablet (100 mg total) by mouth 2 (two) times daily for 7 days.    Dispense:  14 tablet    Refill:  0   Time Stamp The duration of face-to-face time  during this visit was greater than 40 minutes. Greater than 50% of this time was spent in counseling, explanation of diagnosis, planning of further management, and/or coordination of care including primarily discussions of patient and family discontent with navigating health care system/our office/[facility] while I was on vacation as well as explaining reasons for workup above and potential next steps.     Return precautions advised.  [name], MD

## 2018-03-08 ENCOUNTER — Ambulatory Visit (HOSPITAL_COMMUNITY)
Admission: RE | Admit: 2018-03-08 | Discharge: 2018-03-08 | Disposition: A | Discharge status: Home / Self Care | Payer: Medicare Other | Source: Ambulatory Visit | Attending: Family Medicine | Admitting: Family Medicine

## 2018-03-08 DIAGNOSIS — G319 Degenerative disease of nervous system, unspecified: Secondary | ICD-10-CM | POA: Diagnosis not present

## 2018-03-08 DIAGNOSIS — S0990XA Unspecified injury of head, initial encounter: Secondary | ICD-10-CM | POA: Insufficient documentation

## 2018-03-08 DIAGNOSIS — X58XXXA Exposure to other specified factors, initial encounter: Secondary | ICD-10-CM | POA: Diagnosis not present

## 2018-03-09 ENCOUNTER — Encounter: Payer: Self-pay | Admitting: Family Medicine

## 2018-03-09 NOTE — Assessment & Plan Note (Addendum)
S: lasix for 5 days helped- edema and shortness of breath A/P: doesn't appear particularly fluid overloaded today. Patient refused weight today. We will hopefully be able to get weight at last visit and if worsening respiratory status - consider lasix restart long term (could also help with BP). Plan prior had been use prn for weight gain or edema only but we may perhaps plan to use MWF and then more regularly if weight gain or edema if weight trending up, edema worsening at follow up or other signs of fluid overload.

## 2018-03-09 NOTE — Assessment & Plan Note (Signed)
S: controlled poorly on bisoprolol 5mg  BP Readings from Last 3 Encounters:  03/07/18 (!) 154/92  02/22/18 132/86  01/11/18 114/64  A/P:prefer blood pressure goal of <140/90. Continue current meds: for now- she is agitated and doesn't feel well today so perhaps this will improve at time of close follow up within 2 weeks

## 2018-03-11 ENCOUNTER — Ambulatory Visit: Payer: Medicare Other | Admitting: Family Medicine

## 2018-03-11 ENCOUNTER — Encounter: Payer: Self-pay | Admitting: Family Medicine

## 2018-03-11 ENCOUNTER — Other Ambulatory Visit: Payer: Self-pay | Admitting: Family Medicine

## 2018-03-11 VITALS — BP 112/78 | HR 75 | Temp 98.2°F | Ht 64.0 in | Wt 187.0 lb

## 2018-03-11 DIAGNOSIS — I10 Essential (primary) hypertension: Secondary | ICD-10-CM

## 2018-03-11 DIAGNOSIS — R443 Hallucinations, unspecified: Secondary | ICD-10-CM | POA: Diagnosis not present

## 2018-03-11 DIAGNOSIS — R531 Weakness: Secondary | ICD-10-CM | POA: Diagnosis not present

## 2018-03-11 DIAGNOSIS — R451 Restlessness and agitation: Secondary | ICD-10-CM | POA: Insufficient documentation

## 2018-03-11 DIAGNOSIS — J989 Respiratory disorder, unspecified: Secondary | ICD-10-CM

## 2018-03-11 MED ORDER — TRIAMCINOLONE ACETONIDE 0.1 % EX CREA: 1.0000 "application " | TOPICAL_CREAM | Freq: Two times a day (BID) | CUTANEOUS | 0 refills | Status: DC

## 2018-03-11 MED ORDER — BUSPIRONE HCL 5 MG PO TABS: 5.0000 mg | ORAL_TABLET | Freq: Two times a day (BID) | ORAL | 5 refills | Status: DC | PRN

## 2018-03-11 NOTE — Assessment & Plan Note (Signed)
S: poorly controlled last visit on bisoprolol but patient dealing with agitation.   Today, blood pressure much better as agitation is better BP Readings from Last 3 Encounters:  03/11/18 112/78  03/07/18 (!) 154/92  02/22/18 132/86  A/P: blood pressure goal of <140/90. Continue current meds:  Bisoprolol 5 mg

## 2018-03-11 NOTE — Patient Instructions (Addendum)
Health Maintenance Due  Topic Date Due  . INFLUENZA VACCINE -schedule for this Fall 03/03/2018   Follow up 2-4 weeks (may be able to do flu shot then)  Try new cream triamcinolone for the rash on chest  Try buspirone as needed for agitation/anger/feeling upset up to twice a day  Glad you are feeling some better- hopefully we can get you feeling even better by next time- see [place] back for new or worsening symptoms sooner than 2-4 weeks

## 2018-03-11 NOTE — Progress Notes (Signed)
Subjective:  [name] is a 84 y.o. year old very pleasant female patient who presents for/with See problem oriented charting ROS- no fever or chills. Cough and congestion much improved. No more visual hallucinations   Past Medical History-  Patient Active Problem List   Diagnosis Date Noted  . Chronic respiratory failure with hypoxia (HCC) 06/15/2017    Priority: High  . Diastolic CHF ([place]) 76/54/6503    Priority: High  . History of cardioembolic stroke 54/65/6812    Priority: High  . Spinal stenosis, lumbar region, with neurogenic claudication 05/09/2014    Priority: High  . History of Focal seizure ([place]) 06/16/2013    Priority: High  . TIA (transient ischemic attack) 05/31/2013    Priority: High  . History of Large B-cell lymphoma ([place]) 02/18/2012    Priority: High  . Atrial fibrillation ([place]) 12/14/2008    Priority: High  . Bacteremia due to Streptococcus 11/21/2015    Priority: Medium  . Cellulitis of leg, right 11/20/2015    Priority: Medium  . Diverticulitis of colon 09/26/2014    Priority: Medium  . CKD (chronic kidney disease), stage III ([place]) 09/26/2014    Priority: Medium  . Laryngeal carcinoma ([place])     Priority: Medium  . Anxiety state 10/20/2008    Priority: Medium  . Depression 10/20/2008    Priority: Medium  . Dyspnea on exertion 07/06/2008    Priority: Medium  . COPD GOLD II 03/26/2008    Priority: Medium  . Hyperlipidemia 03/18/2007    Priority: Medium  . Essential hypertension 03/18/2007    Priority: Medium  . Chronic diarrhea 09/04/2017    Priority: Low  . Aortic atherosclerosis ([place]) 06/30/2016    Priority: Low  . Weakness generalized 06/30/2013    Priority: Low  . Bilateral hand pain 01/09/2013    Priority: Low  . Agitation 03/11/2018    Medications- reviewed and updated Current Outpatient Medications  Medication Sig Dispense Refill  . atorvastatin (LIPITOR) 20 MG tablet TAKE 1 TABLET BY MOUTH ONCE DAILY 30 tablet 5  .  bisoprolol (ZEBETA) 5 MG tablet TAKE ONE TABLET BY MOUTH ONCE DAILY 30 tablet 5  . cholecalciferol (VITAMIN D) 1000 UNITS tablet Take 1,000 Units by mouth daily.    . Cyanocobalamin (VITAMIN B-12 PO) Take 1 tablet by mouth daily.    Marland Kitchen doxycycline (VIBRA-TABS) 100 MG tablet Take 1 tablet (100 mg total) by mouth 2 (two) times daily for 7 days. 14 tablet 0  . gabapentin (NEURONTIN) 100 MG capsule TAKE ONE CAPSULE BY MOUTH 3 TIMES A DAY 270 capsule 1  . lamoTRIgine ([name]) 100 MG tablet Take 1 tablet (100 mg total) by mouth 2 (two) times daily. 60 tablet 1  . levETIRAcetam (KEPPRA) 1000 MG tablet TAKE 1 TABLET BY MOUTH TWICE A DAY 60 tablet 3  . loperamide (IMODIUM) 2 MG capsule Take 2 mg by mouth daily as needed for diarrhea or loose stools.     Marland Kitchen omeprazole (PRILOSEC) 20 MG capsule Take 20 mg by mouth daily.    . OXYGEN 2lpm with sleep and occ during the day    . predniSONE (DELTASONE) 20 MG tablet Take 1 tablet by mouth daily for 5 days, then 1/2 tablet daily for 2 days 6 tablet 0  . XARELTO 15 MG TABS tablet TAKE 1 TABLET BY MOUTH ONCE DAILY WITH SUPPER 30 tablet 11  . busPIRone ([name]) 5 MG tablet Take 1 tablet (5 mg total) by mouth 2 (two) times daily as  needed. 30 tablet 5  . furosemide (LASIX) 20 MG tablet Take 1 tablet (20 mg total) by mouth daily for 5 days. 5 tablet 1  . triamcinolone cream (KENALOG) 0.1 % Apply 1 application topically 2 (two) times daily. For 7-10 days maximum 80 g 0   No current facility-administered medications for this visit.     Objective: BP 112/78 (BP Location: Left Arm, Patient Position: Sitting, Cuff Size: Large)   Pulse 75   Temp 98.2 F (36.8 C) (Oral)   Ht 5\' 4"  (1.626 m)   Wt 187 lb (84.8 kg)   SpO2 92% Comment: Refuses Oxygen  BMI 32.10 kg/m  Gen: NAD, resting comfortably CV: RRR no murmurs rubs or gallops Lungs: Abdomen: soft/nontender/nondistended/normal bowel sounds.  Ext: no edema Skin: warm, dry Neuro: walks with walker Psych: seems  much more calm, no yelling during visit but still doesn't trust family intentions  Assessment/Plan:  Essential hypertension S: poorly controlled last visit on bisoprolol but patient dealing with agitation.   Today, blood pressure much better as agitation is better BP Readings from Last 3 Encounters:  03/11/18 112/78  03/07/18 (!) 154/92  02/22/18 132/86  A/P: blood pressure goal of <140/90. Continue current meds:  Bisoprolol 5 mg  Agitation Generalized weakness Hallucinations Respiratory illness S:  Last visit patient was experiencing hallucinations and increased agitation. We were concerned possibly from respiratory infection though prior x-ray did not show pneumonia- we treated with 7 days of doxycycline.   Family thought signficant change had occurred since going from keppra to lamictal. She is currently on a taper back to prior med per Dr. Tomi Likens   Patient with fall/head trauma before last visit- luckily head CT without hemorrhage  She had generalized weakness as well so we ordered home health PT  Today, she states she feels better with less cough and congestion. She has not had more hallucinations. Strength somewhat better but still heavily relying on walker. No further falls. Husband states that within an hour or so of lamictal she becomes more paranoid- today she focuses on family trying to put her into nursing home at last visit (there was no talk of that)  She also reports a rash on her chest that predated treatments/doxycycline. Itchy - she didn't report this last visit A/P: generalized weakness - improved but still want her to have home health. Hasn't had any more falls thankfully  Hallucinations- have resolved while on antibiotics and transitioning off lamictal and back to keppra. Has some paranoia- may be having some delusions still   Respiratory illness improving. I am still concerned that her oxygen dropping may be causing confusion/agitation. Husband to check oxygen  when she is agitated at home. Patient refuses to wear oxygen and this may be part of the problem- we will see once she is off antibiotic and fully back on keppra if issues improve.   weight down from last visit, no edema- doubt fluid overload contributing  Irritability- trial of buspirone 5mg  BID prn when more agitated   Future Appointments  Date Time Provider [place]  04/01/2018 10:00 AM Marin Olp, MD [facility]  04/26/2018 10:50 AM Pieter Partridge, DO [facility] None  01/12/2019 10:45 AM Owens Shark, NP [facility] None   2-4 weeks advised  Meds ordered this encounter  Medications  . triamcinolone cream (KENALOG) 0.1 %    Sig: Apply 1 application topically 2 (two) times daily. For 7-10 days maximum    Dispense:  80 g  Refill:  0  . busPIRone ([name]) 5 MG tablet    Sig: Take 1 tablet (5 mg total) by mouth 2 (two) times daily as needed.    Dispense:  30 tablet    Refill:  5    Return precautions advised.  [name], MD

## 2018-03-11 NOTE — Assessment & Plan Note (Signed)
Generalized weakness Hallucinations Respiratory illness S:  Last visit patient was experiencing hallucinations and increased agitation. We were concerned possibly from respiratory infection though prior x-ray did not show pneumonia- we treated with 7 days of doxycycline.   Family thought signficant change had occurred since going from keppra to lamictal. She is currently on a taper back to prior med per Dr. Tomi Likens   Patient with fall/head trauma before last visit- luckily head CT without hemorrhage  She had generalized weakness as well so we ordered home health PT  Today, she states she feels better with less cough and congestion. She has not had more hallucinations. Strength somewhat better but still heavily relying on walker. No further falls. Husband states that within an hour or so of lamictal she becomes more paranoid- today she focuses on family trying to put her into nursing home at last visit (there was no talk of that)  She also reports a rash on her chest that predated treatments/doxycycline. Itchy - she didn't report this last visit A/P: generalized weakness - improved but still want her to have home health. Hasn't had any more falls thankfully  Hallucinations- have resolved while on antibiotics and transitioning off lamictal and back to keppra. Has some paranoia- may be having some delusions still   Respiratory illness improving. I am still concerned that her oxygen dropping may be causing confusion/agitation. Husband to check oxygen when she is agitated at home. Patient refuses to wear oxygen and this may be part of the problem- we will see once she is off antibiotic and fully back on keppra if issues improve.   weight down from last visit, no edema- doubt fluid overload contributing  Irritability- trial of buspirone 5mg  BID prn when more agitated

## 2018-03-14 ENCOUNTER — Telehealth: Payer: Self-pay | Admitting: Family Medicine

## 2018-03-14 NOTE — Telephone Encounter (Signed)
Noted  

## 2018-03-14 NOTE — Telephone Encounter (Signed)
Noted thanks. Hoping patient opens up to this. Family indicated it may be a difficult sell when PT arrives.

## 2018-03-14 NOTE — Telephone Encounter (Signed)
See note.   Copied from [place] [phone]. Topic: Quick Communication - See Telephone Encounter >> Mar 14, 2018  3:24 PM Hewitt Shorts wrote: Laurey Arrow with [facility] is calling to let the provider know that pt declined PT until Wednesday or Thursday of this week

## 2018-03-16 ENCOUNTER — Telehealth: Payer: Self-pay | Admitting: Family Medicine

## 2018-03-16 NOTE — Telephone Encounter (Signed)
Called and spoke with Constance Haw and provided verbal orders as requested for PT

## 2018-03-16 NOTE — Telephone Encounter (Signed)
Copied from [place] [id number]. Topic: General - Other >> Mar 16, 2018 11:20 AM Cecelia Byars, NT wrote: Reason for CRM: [facility] called and need  verbal orders for to need start of care to 03/21/18  per the patient request please call [phone]  ok to leave msg

## 2018-03-24 ENCOUNTER — Telehealth: Payer: Self-pay | Admitting: Family Medicine

## 2018-03-24 NOTE — Telephone Encounter (Signed)
See note

## 2018-03-24 NOTE — Telephone Encounter (Unsigned)
Copied from [place] [id number]. Topic: General - Other >> Mar 24, 2018  4:46 PM Mcneil, Ja-Kwan wrote: Reason for CRM: Will with [facility] requests verbal orders for Occupational Therapy for 2 times a week for 1st week then 1 time a week for 2 weeks. Cb# [phone]

## 2018-03-25 NOTE — Telephone Encounter (Signed)
Called [name] at [facility] with verbal orders. [name] did not answer but I left a VM giving verbal for OT. If he has any questions he can give the office a call back at [phone]

## 2018-04-01 ENCOUNTER — Telehealth: Payer: Self-pay | Admitting: Family Medicine

## 2018-04-01 ENCOUNTER — Ambulatory Visit: Payer: Medicare Other | Admitting: Family Medicine

## 2018-04-01 NOTE — Telephone Encounter (Signed)
Called will gave verbal ok to move app.

## 2018-04-01 NOTE — Telephone Encounter (Signed)
See note

## 2018-04-01 NOTE — Telephone Encounter (Signed)
Copied from [place] [id number]. Topic: General - Other >> Apr 01, 2018  4:01 PM Keene Breath wrote: Reason for CRM: Will from [facility] called to get Dr's authorization to reschedule visit because patient is out of town.  Please advise.  CB# [phone].

## 2018-04-21 DIAGNOSIS — Z9981 Dependence on supplemental oxygen: Secondary | ICD-10-CM

## 2018-04-21 DIAGNOSIS — I5032 Chronic diastolic (congestive) heart failure: Secondary | ICD-10-CM

## 2018-04-21 DIAGNOSIS — I130 Hypertensive heart and chronic kidney disease with heart failure and stage 1 through stage 4 chronic kidney disease, or unspecified chronic kidney disease: Secondary | ICD-10-CM

## 2018-04-21 DIAGNOSIS — M48062 Spinal stenosis, lumbar region with neurogenic claudication: Secondary | ICD-10-CM

## 2018-04-21 DIAGNOSIS — F419 Anxiety disorder, unspecified: Secondary | ICD-10-CM

## 2018-04-21 DIAGNOSIS — I700 Atherosclerosis of aorta: Secondary | ICD-10-CM

## 2018-04-21 DIAGNOSIS — E785 Hyperlipidemia, unspecified: Secondary | ICD-10-CM

## 2018-04-21 DIAGNOSIS — N183 Chronic kidney disease, stage 3 (moderate): Secondary | ICD-10-CM

## 2018-04-21 DIAGNOSIS — Z7901 Long term (current) use of anticoagulants: Secondary | ICD-10-CM

## 2018-04-21 DIAGNOSIS — Z8572 Personal history of non-Hodgkin lymphomas: Secondary | ICD-10-CM

## 2018-04-21 DIAGNOSIS — M6281 Muscle weakness (generalized): Secondary | ICD-10-CM

## 2018-04-21 DIAGNOSIS — Z8673 Personal history of transient ischemic attack (TIA), and cerebral infarction without residual deficits: Secondary | ICD-10-CM

## 2018-04-21 DIAGNOSIS — C329 Malignant neoplasm of larynx, unspecified: Secondary | ICD-10-CM

## 2018-04-21 DIAGNOSIS — R443 Hallucinations, unspecified: Secondary | ICD-10-CM

## 2018-04-21 DIAGNOSIS — J449 Chronic obstructive pulmonary disease, unspecified: Secondary | ICD-10-CM

## 2018-04-21 DIAGNOSIS — G40909 Epilepsy, unspecified, not intractable, without status epilepticus: Secondary | ICD-10-CM

## 2018-04-21 DIAGNOSIS — I4891 Unspecified atrial fibrillation: Secondary | ICD-10-CM

## 2018-04-21 DIAGNOSIS — J9611 Chronic respiratory failure with hypoxia: Secondary | ICD-10-CM

## 2018-04-21 DIAGNOSIS — F329 Major depressive disorder, single episode, unspecified: Secondary | ICD-10-CM

## 2018-04-25 ENCOUNTER — Emergency Department (HOSPITAL_COMMUNITY): Payer: Medicare Other

## 2018-04-25 ENCOUNTER — Ambulatory Visit: Payer: Self-pay | Admitting: Family Medicine

## 2018-04-25 ENCOUNTER — Observation Stay (HOSPITAL_COMMUNITY)
Admission: EM | Admit: 2018-04-25 | Discharge: 2018-04-25 | Discharge status: Against Medical Advice | Payer: Medicare Other | Attending: Internal Medicine | Admitting: Internal Medicine

## 2018-04-25 ENCOUNTER — Other Ambulatory Visit: Payer: Self-pay

## 2018-04-25 ENCOUNTER — Encounter (HOSPITAL_COMMUNITY): Payer: Self-pay | Admitting: Emergency Medicine

## 2018-04-25 DIAGNOSIS — Z8719 Personal history of other diseases of the digestive system: Secondary | ICD-10-CM | POA: Diagnosis not present

## 2018-04-25 DIAGNOSIS — Z885 Allergy status to narcotic agent status: Secondary | ICD-10-CM | POA: Insufficient documentation

## 2018-04-25 DIAGNOSIS — Z8521 Personal history of malignant neoplasm of larynx: Secondary | ICD-10-CM | POA: Insufficient documentation

## 2018-04-25 DIAGNOSIS — F329 Major depressive disorder, single episode, unspecified: Secondary | ICD-10-CM | POA: Diagnosis not present

## 2018-04-25 DIAGNOSIS — Z87891 Personal history of nicotine dependence: Secondary | ICD-10-CM | POA: Insufficient documentation

## 2018-04-25 DIAGNOSIS — R0789 Other chest pain: Principal | ICD-10-CM | POA: Insufficient documentation

## 2018-04-25 DIAGNOSIS — Z79899 Other long term (current) drug therapy: Secondary | ICD-10-CM | POA: Diagnosis not present

## 2018-04-25 DIAGNOSIS — I480 Paroxysmal atrial fibrillation: Secondary | ICD-10-CM | POA: Insufficient documentation

## 2018-04-25 DIAGNOSIS — K219 Gastro-esophageal reflux disease without esophagitis: Secondary | ICD-10-CM | POA: Insufficient documentation

## 2018-04-25 DIAGNOSIS — Z808 Family history of malignant neoplasm of other organs or systems: Secondary | ICD-10-CM | POA: Insufficient documentation

## 2018-04-25 DIAGNOSIS — Z881 Allergy status to other antibiotic agents status: Secondary | ICD-10-CM | POA: Diagnosis not present

## 2018-04-25 DIAGNOSIS — M1990 Unspecified osteoarthritis, unspecified site: Secondary | ICD-10-CM | POA: Insufficient documentation

## 2018-04-25 DIAGNOSIS — Z9841 Cataract extraction status, right eye: Secondary | ICD-10-CM | POA: Insufficient documentation

## 2018-04-25 DIAGNOSIS — I2510 Atherosclerotic heart disease of native coronary artery without angina pectoris: Secondary | ICD-10-CM | POA: Diagnosis not present

## 2018-04-25 DIAGNOSIS — Z96653 Presence of artificial knee joint, bilateral: Secondary | ICD-10-CM | POA: Insufficient documentation

## 2018-04-25 DIAGNOSIS — Z8711 Personal history of peptic ulcer disease: Secondary | ICD-10-CM | POA: Diagnosis not present

## 2018-04-25 DIAGNOSIS — Z8249 Family history of ischemic heart disease and other diseases of the circulatory system: Secondary | ICD-10-CM | POA: Insufficient documentation

## 2018-04-25 DIAGNOSIS — F411 Generalized anxiety disorder: Secondary | ICD-10-CM | POA: Diagnosis not present

## 2018-04-25 DIAGNOSIS — I5032 Chronic diastolic (congestive) heart failure: Secondary | ICD-10-CM | POA: Insufficient documentation

## 2018-04-25 DIAGNOSIS — F419 Anxiety disorder, unspecified: Secondary | ICD-10-CM | POA: Diagnosis not present

## 2018-04-25 DIAGNOSIS — J449 Chronic obstructive pulmonary disease, unspecified: Secondary | ICD-10-CM | POA: Diagnosis not present

## 2018-04-25 DIAGNOSIS — I130 Hypertensive heart and chronic kidney disease with heart failure and stage 1 through stage 4 chronic kidney disease, or unspecified chronic kidney disease: Secondary | ICD-10-CM | POA: Diagnosis not present

## 2018-04-25 DIAGNOSIS — Z823 Family history of stroke: Secondary | ICD-10-CM | POA: Insufficient documentation

## 2018-04-25 DIAGNOSIS — Z9842 Cataract extraction status, left eye: Secondary | ICD-10-CM | POA: Insufficient documentation

## 2018-04-25 DIAGNOSIS — N183 Chronic kidney disease, stage 3 unspecified: Secondary | ICD-10-CM | POA: Diagnosis present

## 2018-04-25 DIAGNOSIS — Z86718 Personal history of other venous thrombosis and embolism: Secondary | ICD-10-CM | POA: Diagnosis not present

## 2018-04-25 DIAGNOSIS — Z800 Family history of malignant neoplasm of digestive organs: Secondary | ICD-10-CM | POA: Insufficient documentation

## 2018-04-25 DIAGNOSIS — R079 Chest pain, unspecified: Secondary | ICD-10-CM | POA: Diagnosis not present

## 2018-04-25 DIAGNOSIS — Z888 Allergy status to other drugs, medicaments and biological substances status: Secondary | ICD-10-CM | POA: Insufficient documentation

## 2018-04-25 DIAGNOSIS — Z8673 Personal history of transient ischemic attack (TIA), and cerebral infarction without residual deficits: Secondary | ICD-10-CM | POA: Diagnosis not present

## 2018-04-25 DIAGNOSIS — Z9981 Dependence on supplemental oxygen: Secondary | ICD-10-CM | POA: Insufficient documentation

## 2018-04-25 DIAGNOSIS — I10 Essential (primary) hypertension: Secondary | ICD-10-CM | POA: Diagnosis present

## 2018-04-25 DIAGNOSIS — E785 Hyperlipidemia, unspecified: Secondary | ICD-10-CM | POA: Diagnosis present

## 2018-04-25 DIAGNOSIS — Z9851 Tubal ligation status: Secondary | ICD-10-CM | POA: Insufficient documentation

## 2018-04-25 DIAGNOSIS — Z880 Allergy status to penicillin: Secondary | ICD-10-CM | POA: Diagnosis not present

## 2018-04-25 DIAGNOSIS — R569 Unspecified convulsions: Secondary | ICD-10-CM

## 2018-04-25 DIAGNOSIS — Z8669 Personal history of other diseases of the nervous system and sense organs: Secondary | ICD-10-CM | POA: Diagnosis not present

## 2018-04-25 DIAGNOSIS — Z9889 Other specified postprocedural states: Secondary | ICD-10-CM | POA: Insufficient documentation

## 2018-04-25 DIAGNOSIS — N1831 Chronic kidney disease, stage 3a: Secondary | ICD-10-CM | POA: Diagnosis present

## 2018-04-25 DIAGNOSIS — Z8572 Personal history of non-Hodgkin lymphomas: Secondary | ICD-10-CM | POA: Insufficient documentation

## 2018-04-25 DIAGNOSIS — I5030 Unspecified diastolic (congestive) heart failure: Secondary | ICD-10-CM | POA: Diagnosis present

## 2018-04-25 DIAGNOSIS — Z7901 Long term (current) use of anticoagulants: Secondary | ICD-10-CM | POA: Insufficient documentation

## 2018-04-25 LAB — CBC
HEMATOCRIT: 41.5 % (ref 36.0–46.0)
Hemoglobin: 12.4 g/dL (ref 12.0–15.0)
MCH: 28 pg (ref 26.0–34.0)
MCHC: 29.9 g/dL — ABNORMAL LOW (ref 30.0–36.0)
MCV: 93.7 fL (ref 78.0–100.0)
Platelets: 166 10*3/uL (ref 150–400)
RBC: 4.43 MIL/uL (ref 3.87–5.11)
RDW: 13.8 % (ref 11.5–15.5)
WBC: 5.7 10*3/uL (ref 4.0–10.5)

## 2018-04-25 LAB — BASIC METABOLIC PANEL
Anion gap: 10 (ref 5–15)
BUN: 14 mg/dL (ref 8–23)
CHLORIDE: 103 mmol/L (ref 98–111)
CO2: 27 mmol/L (ref 22–32)
Calcium: 9.5 mg/dL (ref 8.9–10.3)
Creatinine, Ser: 1.15 mg/dL — ABNORMAL HIGH (ref 0.44–1.00)
GFR calc Af Amer: 48 mL/min — ABNORMAL LOW (ref >60)
GFR calc non Af Amer: 41 mL/min — ABNORMAL LOW (ref >60)
Glucose, Bld: 119 mg/dL — ABNORMAL HIGH (ref 70–99)
POTASSIUM: 4.6 mmol/L (ref 3.5–5.1)
SODIUM: 140 mmol/L (ref 135–145)

## 2018-04-25 LAB — I-STAT TROPONIN, ED
TROPONIN I, POC: 0 ng/mL (ref 0.00–0.08)
Troponin i, poc: 0 ng/mL (ref 0.00–0.08)

## 2018-04-25 MED ORDER — LEVETIRACETAM 500 MG PO TABS: 1000.0000 mg | ORAL_TABLET | Freq: Two times a day (BID) | ORAL | Status: DC

## 2018-04-25 MED ORDER — GABAPENTIN 100 MG PO CAPS: 100.0000 mg | ORAL_CAPSULE | Freq: Three times a day (TID) | ORAL | Status: DC

## 2018-04-25 MED ORDER — BISOPROLOL FUMARATE 5 MG PO TABS: 5.0000 mg | ORAL_TABLET | Freq: Every day | ORAL | Status: DC

## 2018-04-25 MED ORDER — GI COCKTAIL ~~LOC~~: 30.0000 mL | Freq: Four times a day (QID) | ORAL | Status: DC | PRN

## 2018-04-25 MED ORDER — ACETAMINOPHEN 325 MG PO TABS: 650.0000 mg | ORAL_TABLET | ORAL | Status: DC | PRN

## 2018-04-25 MED ORDER — LAMOTRIGINE 100 MG PO TABS: 100.0000 mg | ORAL_TABLET | Freq: Two times a day (BID) | ORAL | Status: DC

## 2018-04-25 MED ORDER — RIVAROXABAN 15 MG PO TABS: 15.0000 mg | ORAL_TABLET | Freq: Every day | ORAL | Status: DC

## 2018-04-25 MED ORDER — HYDRALAZINE HCL 20 MG/ML IJ SOLN: 10.0000 mg | Freq: Four times a day (QID) | INTRAMUSCULAR | Status: DC | PRN

## 2018-04-25 MED ORDER — PANTOPRAZOLE SODIUM 40 MG PO TBEC: 40.0000 mg | DELAYED_RELEASE_TABLET | Freq: Every day | ORAL | Status: DC

## 2018-04-25 MED ORDER — BUSPIRONE HCL 5 MG PO TABS: 5.0000 mg | ORAL_TABLET | Freq: Two times a day (BID) | ORAL | Status: DC | PRN

## 2018-04-25 MED ORDER — ALBUTEROL SULFATE (2.5 MG/3ML) 0.083% IN NEBU: 2.5000 mg | INHALATION_SOLUTION | RESPIRATORY_TRACT | Status: DC | PRN

## 2018-04-25 MED ORDER — ATORVASTATIN CALCIUM 20 MG PO TABS: 20.0000 mg | ORAL_TABLET | Freq: Every day | ORAL | Status: DC

## 2018-04-25 MED ORDER — VITAMIN D 1000 UNITS PO TABS: 1000.0000 [IU] | ORAL_TABLET | Freq: Every day | ORAL | Status: DC

## 2018-04-25 MED ORDER — NITROGLYCERIN 0.4 MG SL SUBL: 0.4000 mg | SUBLINGUAL_TABLET | SUBLINGUAL | Status: DC | PRN

## 2018-04-25 MED ORDER — MORPHINE SULFATE (PF) 2 MG/ML IV SOLN: 1.0000 mg | INTRAVENOUS | Status: DC | PRN

## 2018-04-25 MED ORDER — ONDANSETRON HCL 4 MG/2ML IJ SOLN: 4.0000 mg | Freq: Four times a day (QID) | INTRAMUSCULAR | Status: DC | PRN

## 2018-04-25 MED ORDER — LOPERAMIDE HCL 2 MG PO CAPS: 2.0000 mg | ORAL_CAPSULE | Freq: Every day | ORAL | Status: DC | PRN

## 2018-04-25 NOTE — ED Provider Notes (Signed)
[facility] Provider Note   CSN: [id number] Arrival date & time: 04/25/18  1212     History   Chief Complaint Chief Complaint  Patient presents with  . Chest Pain    HPI [name] is a 84 y.o. female.  HPI   [name] is a 84 y.o. female, with a history of DVT, CAD, COPD, GERD, and TIA, presenting to the ED with chest pain beginning last night. Pain is central chest, 2-3/10 at its worst, dull, nonradiating, lasting for a few seconds at a time. Has not had this pain before.  Denies fever/chills, cough, N/V/D, abdominal pain, shortness of breath, lower extremity edema or pain, diaphoresis, syncope, dizziness, or any other complaints.     Past Medical History:  Diagnosis Date  . ABDOMINAL INCISIONAL HERNIA 04/03/2010      . Anxiety   . Arthritis    "lots; hands" (05/26/2013)  . Blood transfusion    "w/1st miscarriage" (05/26/2013)  . COPD (chronic obstructive pulmonary disease) ([place])   . Coronary artery disease   . Depression   . Diverticulitis of colon with hemorrhage 07/24/2008  . DVT (deep venous thrombosis) ([place]) 1954   "after I had my first child" (05/26/2013)  . Exertional shortness of breath   . Family history of anesthesia complication    "sisters also get sick as a dog" (05/26/2013)  . GERD 05/03/2007      . GERD (gastroesophageal reflux disease)   . H/O hiatal hernia   . HCAP (healthcare-associated pneumonia) 06/26/2013  . Heart murmur    "when I was a child" (05/26/2013)  . History of TIAs   . Hyperlipidemia   . Hypertension   . 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 Lymphona, mantle cell, inguinal region/lower limb ([place])    "large c-cell" (05/26/2013)  . OSTEOARTHRITIS 05/03/2007  . PEPTIC ULCER DISEASE 07/06/2008  . Pneumonia 2013   "once" (05/26/2013)  . PUD (peptic ulcer disease)   . PVD (peripheral vascular disease) ([place])   . Seizures  ([place])   . Stroke ([place]) 05/26/2013   "very mild; affected my speech for a while" (05/26/2013)  . VARICOSE VEINS LOWER EXTREMITIES W/INFLAMMATION 01/15/2009    Patient Active Problem List   Diagnosis Date Noted  . Agitation 03/11/2018  . Chronic diarrhea 09/04/2017  . Chronic respiratory failure with hypoxia ([place]) 06/15/2017  . Aortic atherosclerosis ([place]) 06/30/2016  . Diastolic CHF ([place]) 99/83/3825  . Bacteremia due to Streptococcus 11/21/2015  . Cellulitis of leg, right 11/20/2015  . History of cardioembolic stroke 05/39/7673  . Diverticulitis of colon 09/26/2014  . CKD (chronic kidney disease), stage III ([place]) 09/26/2014  . Laryngeal carcinoma ([place])   . Spinal stenosis, lumbar region, with neurogenic claudication 05/09/2014  . Weakness generalized 06/30/2013  . History of Focal seizure ([place]) 06/16/2013  . TIA (transient ischemic attack) 05/31/2013  . Bilateral hand pain 01/09/2013  . History of Large B-cell lymphoma ([place]) 02/18/2012  . Atrial fibrillation ([place]) 12/14/2008  . Anxiety state 10/20/2008  . Depression 10/20/2008  . Dyspnea on exertion 07/06/2008  . COPD GOLD II 03/26/2008  . Hyperlipidemia 03/18/2007  . Essential hypertension 03/18/2007    Past Surgical History:  Procedure Laterality Date  . ABDOMINAL EXPLORATION SURGERY  12/01/2012  . APPENDECTOMY  09/13/2008  . CAROTID ENDARTERECTOMY Bilateral 2004   2004 a month apart right and left  . CARPAL TUNNEL RELEASE Left ?1960's  .  CATARACT EXTRACTION  2005   bilateral  . COLON SURGERY  09/13/2008   Laparoscopic assisted converted to open sigmoid colectomy.Archie Endo 09/13/2008 (05/26/2013)  . DILATION AND CURETTAGE OF UTERUS  1950's   "had 2 for miscarriages" (05/26/2013)  . DIVERTING ILEOSTOMY  09/22/2008   iliostomy for diverticulitis/notes 09/22/2008 (05/26/2013)  . HERNIA REPAIR  05/2010   ventral hernia repair  . ILEOSTOMY CLOSURE  01/2010  . RADICAL NECK DISSECTION  1986   "larynx cancer" (05/26/2013)  .  TONSILLECTOMY AND ADENOIDECTOMY  1936   "tonsils grew back; no 2nd OR" (05/26/2013)  . TOTAL KNEE ARTHROPLASTY Bilateral    2002 left 2008 right  . TUBAL LIGATION  1972     OB History   None      Home Medications    Prior to Admission medications   Medication Sig Start Date End Date Taking? Authorizing Provider  atorvastatin (LIPITOR) 20 MG tablet TAKE 1 TABLET BY MOUTH ONCE DAILY Patient taking differently: Take 20 mg by mouth daily at 12 noon.  10/07/17  Yes Marin Olp, MD  bisoprolol (ZEBETA) 5 MG tablet TAKE 1 TABLET BY MOUTH ONCE DAILY Patient taking differently: Take 5 mg by mouth daily at 12 noon.  03/11/18  Yes Marin Olp, MD  busPIRone ([name]) 5 MG tablet Take 1 tablet (5 mg total) by mouth 2 (two) times daily as needed. Patient taking differently: Take 5 mg by mouth 2 (two) times daily as needed (for anxiety).  03/11/18  Yes Marin Olp, MD  cholecalciferol (VITAMIN D) 1000 UNITS tablet Take 1,000 Units by mouth daily.   Yes [provider]  Cyanocobalamin (VITAMIN B-12 PO) Take 1 tablet by mouth daily.   Yes [provider]  gabapentin (NEURONTIN) 100 MG capsule TAKE ONE CAPSULE BY MOUTH 3 TIMES A DAY Patient taking differently: Take 100 mg by mouth 3 (three) times daily.  03/07/18  Yes Tomi Likens, Adam R, DO  lamoTRIgine ([name]) 100 MG tablet Take 1 tablet (100 mg total) by mouth 2 (two) times daily. 02/07/18  Yes Jaffe, Adam R, DO  levETIRAcetam (KEPPRA) 1000 MG tablet TAKE 1 TABLET BY MOUTH TWICE A DAY Patient taking differently: Take 1,000 mg by mouth 2 (two) times daily.  02/07/18  Yes Tomi Likens, Adam R, DO  loperamide (IMODIUM) 2 MG capsule Take 2 mg by mouth daily as needed for diarrhea or loose stools.    Yes [provider]  omeprazole (PRILOSEC) 20 MG capsule Take 20 mg by mouth daily.   Yes [provider]  triamcinolone cream (KENALOG) 0.1 % Apply 1 application topically 2 (two) times daily. For 7-10 days maximum Patient  taking differently: Apply 1 application topically 2 (two) times daily as needed. Apply to affected, dry areas two times a day for 7-10 days maximum 03/11/18  Yes Marin Olp, MD  XARELTO 15 MG TABS tablet TAKE 1 TABLET BY MOUTH ONCE DAILY WITH SUPPER Patient taking differently: Take 15 mg by mouth daily with supper.  09/20/17  Yes Marin Olp, MD  furosemide (LASIX) 20 MG tablet Take 1 tablet (20 mg total) by mouth daily for 5 days. Patient not taking: Reported on 04/25/2018 02/23/18 04/25/18  Marin Olp, MD  OXYGEN 2lpm with sleep and occ during the day    [provider]  predniSONE (DELTASONE) 20 MG tablet Take 1 tablet by mouth daily for 5 days, then 1/2 tablet daily for 2 days Patient not taking: Reported on 04/25/2018 11/23/17  Marin Olp, MD    Family History Family History  Problem Relation Age of Onset  . Colon cancer Sister        colon  . Hyperlipidemia Mother   . Stroke Mother   . Cancer Father        mesothelioma    Social History Social History   Tobacco Use  . Smoking status: Former Smoker    Packs/day: 2.50    Years: 37.00    Pack years: 92.50    Types: Cigarettes    Last attempt to quit: 09/05/1984    Years since quitting: 33.6  . Smokeless tobacco: Never Used  . Tobacco comment: quit when she was dx with cancer  Substance Use Topics  . Alcohol use: No    Alcohol/week: 0.0 standard drinks  . Drug use: No     Allergies   Ativan [lorazepam]; Propoxyphene hcl; Codeine; Penicillins; and Vancomycin   Review of Systems Review of Systems  Constitutional: Negative for chills, diaphoresis and fever.  Respiratory: Negative for cough and shortness of breath.   Cardiovascular: Positive for chest pain. Negative for palpitations and leg swelling.  Gastrointestinal: Negative for abdominal pain, diarrhea, nausea and vomiting.  Musculoskeletal: Negative for back pain.  Skin: Negative for color change.  Neurological: Negative for  dizziness, syncope, weakness and light-headedness.  All other systems reviewed and are negative.    Physical Exam Updated Vital Signs BP (!) 148/68 (BP Location: Right Arm)   Pulse 67   Temp (!) 97.5 F (36.4 C)   Resp 18   SpO2 91%   Physical Exam  Constitutional: She appears well-developed and well-nourished. No distress.  HENT:  Head: Normocephalic and atraumatic.  Eyes: Conjunctivae are normal.  Neck: Neck supple.  Cardiovascular: Normal rate, regular rhythm, normal heart sounds and intact distal pulses.  Pulmonary/Chest: Effort normal and breath sounds normal. No respiratory distress.  Abdominal: Soft. There is no tenderness. There is no guarding.  Musculoskeletal: She exhibits no edema.  Lymphadenopathy:    She has no cervical adenopathy.  Neurological: She is alert.  Skin: Skin is warm and dry. She is not diaphoretic.  Psychiatric: She has a normal mood and affect. Her behavior is normal.  Nursing note and vitals reviewed.    ED Treatments / Results  Labs (all labs ordered are listed, but only abnormal results are displayed) Labs Reviewed  BASIC METABOLIC PANEL - Abnormal; Notable for the following components:      Result Value   Glucose, Bld 119 (*)    Creatinine, Ser 1.15 (*)    GFR calc non Af Amer 41 (*)    GFR calc Af Amer 48 (*)    All other components within normal limits  CBC - Abnormal; Notable for the following components:   MCHC 29.9 (*)    All other components within normal limits  I-STAT TROPONIN, ED  I-STAT TROPONIN, ED    EKG EKG Interpretation  Date/Time:  Monday April 25 2018 12:18:29 EDT Ventricular Rate:  64 PR Interval:  184 QRS Duration: 84 QT Interval:  432 QTC Calculation: 445 R Axis:   62 Text Interpretation:  Normal sinus rhythm Cannot rule out Anterior infarct , age undetermined Abnormal ECG No significant change since Confirmed by Lennice Sites [phone]) on 04/25/2018 3:34:34 PM   Radiology Dg Chest 2 View  Result  Date: 04/25/2018 CLINICAL DATA:  Central chest pain dense last night EXAM: CHEST - 2 VIEW COMPARISON:  Chest x-rays dated 02/22/2018 and 06/14/2017. FINDINGS:  Heart size and mediastinal contours are stable. Atherosclerotic changes noted at the aortic arch. Chronic bronchitic changes noted centrally. Lungs are hyperexpanded indicating COPD. There are stable chronic pleuroparenchymal scarring at the lung apices. No new confluent airspace opacity to suggest a developing pneumonia. No pleural effusion or pneumothorax seen. No acute or suspicious osseous finding. IMPRESSION: 1. No acute findings.  No evidence of pneumonia or pulmonary edema. 2. COPD and chronic bronchitic changes. Electronically Signed   By: Franki Cabot M.D.   On: 04/25/2018 13:07    Procedures Procedures (including critical care time)  Medications Ordered in ED Medications - No data to display   Initial Impression / Assessment and Plan / ED Course  I have reviewed the triage vital signs and the nursing notes.  Pertinent labs & imaging results that were available during my care of the patient were reviewed by me and considered in my medical decision making (see chart for details).  Clinical Course as of Apr 25 1734  Mon Apr 25, 2018  1536 Patient is pain-free.   [SJ]  1731 Spoke with hospitalist. Agrees to admit the patient.   [SJ]    Clinical Course User Index [SJ] Alek Poncedeleon C, PA-C   Patient presents with intermittent chest pain, atypical sounding for ACS, however, HEART score is 4, indicating moderate risk for a cardiac event. Wells criteria score is 1.5, indicating low risk for PE and combination of physical exam findings and patient description of her complaint are not suggestive of PE.  Doubt dissection.  Troponin negative.  No acute EKG changes.  Patient is anticoagulated on Xarelto.  Observation for chest pain rule out.  Findings and plan of care discussed with Lennice Sites, DO. Dr. Ronnald Nian personally evaluated  and examined this patient.  Vitals:   04/25/18 1219 04/25/18 1426 04/25/18 1530 04/25/18 1630  BP: 137/90 (!) 148/68 (!) 163/95 (!) 163/94  Pulse: 66 67 66 72  Resp: 19 18 (!) 23 15  Temp: (!) 97.5 F (36.4 C)     SpO2: 100% 91% 95% 94%     Final Clinical Impressions(s) / ED Diagnoses   Final diagnoses:  Atypical chest pain    ED Discharge Orders    None       Layla Maw 04/25/18 1736    Lennice Sites, DO 04/25/18 1856

## 2018-04-25 NOTE — Telephone Encounter (Signed)
Pt c/o of intermittent chest pain that began 2 days ago. Pain is located to the center of the chest. Denies radiation into the neck, jaw, arms or back. Pt stated the chest pain did not wake her up at nght. Pt stated that the chest pain will go away for a couple of hours then the chest pain comes back. Pt rated the pain as 4-5 out of 10 (moderate). Pt has a h/o 3 strokes in the past, hypertension, high cholesterol. Pt denies any numbness or weakness to the face, arms or legs on one side of the body. Pt thinks her husband falling off a ladder gave her stress and she thinks that is why she is having chest pain. Denies dizziness, nausea, vomiting, sweating,fever, difficulty breathing or cough.  Pt's voice is hoarse but she stated it was a side effect after having radiation for cancer.  Disposition is to go to ED or PCP (alternative with approval).  Called PCP office to discuss pt's symptoms and asked to transfer the call to [facility] nurse. Pt transferred to office to speak to [facility].  Reason for Disposition . Chest pain lasts > 5 minutes (Exceptions: chest pain occurring > 3 days ago and now asymptomatic; same as previously diagnosed heartburn and has accompanying sour taste in mouth)    Pt with chest pain that comes and goes. Chest pain will go away for a couple of hours then come back.  Answer Assessment - Initial Assessment Questions 1. LOCATION: "Where does it hurt?"       Center of chest 2. RADIATION: "Does the pain go anywhere else?" (e.g., into neck, jaw, arms, back)     no 3. ONSET: "When did the chest pain begin?" (Minutes, hours or days)      2 days ago 4. PATTERN "Does the pain come and go, or has it been constant since it started?"  "Does it get worse with exertion?"      Comes and goes goes away for couple of hours then comes back 5. DURATION: "How long does it last" (e.g., seconds, minutes, hours)     2 hours  6. SEVERITY: "How bad is the pain?"  (e.g., Scale 1-10; mild, moderate, or  severe)    - MILD (1-3): doesn't interfere with normal activities     - MODERATE (4-7): interferes with normal activities or awakens from sleep    - SEVERE (8-10): excruciating pain, unable to do any normal activities       Moderate 4-5/10 7. CARDIAC RISK FACTORS: "Do you have any history of heart problems or risk factors for heart disease?" (e.g., prior heart attack, angina; high blood pressure, diabetes, being overweight, high cholesterol, smoking, or strong family history of heart disease)     H/o CVA,HTN, overweight, high cholesterol,  8. PULMONARY RISK FACTORS: "Do you have any history of lung disease?"  (e.g., blood clots in lung, asthma, emphysema, birth control pills)     no 9. CAUSE: "What do you think is causing the chest pain?"     Stress when pt's husband fell off and short ladder 10. OTHER SYMPTOMS: "Do you have any other symptoms?" (e.g., dizziness, nausea, vomiting, sweating, fever, difficulty breathing, cough)       Denies all sx 11. PREGNANCY: "Is there any chance you are pregnant?" "When was your last menstrual period?"       n/a  Protocols used: CHEST PAIN-A-AH

## 2018-04-25 NOTE — Discharge Summary (Deleted)
HISTORY AND PHYSICAL       PATIENT DETAILS Name: [name] Age: 84 y.o. Sex: female Date of Birth: [birth year] Admit Date: 04/25/2018 [id number], Brayton Mars, MD   Patient coming from: Home   CHIEF COMPLAINT:  Chest pain-x3 days  HPI: [name] is a 84 y.o. female with medical history significant of PAF on anticoagulation, seizure disorder on antiepileptics, anxiety, laryngeal cancer, lymphoma (in remission), COPD-on home O2 as needed-presenting with several days history of chest pain.  Per patient-pain is in the center of her chest-around 3/10 at its worst, intermittent in nature.  Pain is not related to any particular activity-and has occurred at rest.  Per patient pain last sometimes for a few minutes and spontaneously resolves.  There are no particular aggravating or relieving factors.  There is no associated nausea, vomiting, shortness of breath or diaphoresis.  Last night she had intermittent chest pain lasting a few minutes but ongoing for 2-3 hours.  She was subsequently brought to the hospital for further evaluation-in the hospital service was asked to admit the patient.  Patient denies any headache, ongoing chest pain during my evaluation.  She denies any history of fever, nausea vomiting, diarrhea or abdominal pain.  ED Course: Usual EKG/troponins negative  Note: Lives at: Home Mobility: Independent Chronic Indwelling Foley:no   REVIEW OF SYSTEMS:  Constitutional:   No  weight loss, night sweats,  Fevers, chills, fatigue.  HEENT:    No headaches, Dysphagia,Tooth/dental problems,Sore throat  Cardio-vascular: No Orthopnea, PND,lower extremity edema, anasarca, palpitations  GI:  No heartburn, indigestion, abdominal pain, nausea, vomiting, diarrhea, melena or hematochezia  Resp: No shortness of breath, cough, hemoptysis,plueritic chest pain.   Skin:  No rash or lesions.  GU:  No dysuria, change in color of urine, no urgency or  frequency.  No flank pain.  Musculoskeletal: No joint pain or swelling.  No decreased range of motion.  No back pain.  Endocrine: No heat intolerance, no cold intolerance, no polyuria, no polydipsia  Psych: No change in mood or affect. No depression or anxiety.  No memory loss.   ALLERGIES:   Allergies  Allergen Reactions  . Ativan [Lorazepam] Other (See Comments)    Severe hallucinations   . Propoxyphene Hcl Nausea And Vomiting  . Codeine Itching  . Penicillins Rash    Has patient had a PCN reaction causing immediate rash, facial/tongue/throat swelling, SOB or lightheadedness with hypotension: Yes Has patient had a PCN reaction causing severe rash involving mucus membranes or skin necrosis: Unk Has patient had a PCN reaction that required hospitalization: No Has patient had a PCN reaction occurring within the last 10 years: Yes If all of the above answers are "NO", then may proceed with Cephalosporin use.   . Vancomycin Rash    PAST MEDICAL HISTORY: Past Medical History:  Diagnosis Date  . ABDOMINAL INCISIONAL HERNIA 04/03/2010      . Anxiety   . Arthritis    "lots; hands" (05/26/2013)  . Blood transfusion    "w/1st miscarriage" (05/26/2013)  . COPD (chronic obstructive pulmonary disease) ([place])   . Coronary artery disease   . Depression   . Diverticulitis of colon with hemorrhage 07/24/2008  . DVT (deep venous thrombosis) ([place]) 1954   "after I had my first child" (05/26/2013)  . Exertional shortness of breath   . Family history of anesthesia complication    "sisters also get sick as a dog" (05/26/2013)  . GERD 05/03/2007      .  GERD (gastroesophageal reflux disease)   . H/O hiatal hernia   . HCAP (healthcare-associated pneumonia) 06/26/2013  . Heart murmur    "when I was a child" (05/26/2013)  . History of TIAs   . Hyperlipidemia   . Hypertension   . Laryngeal carcinoma ([place]) hx of ca   remotely with resection and xrt/chronic hoarsemenss  . Lymphona, mantle  cell, inguinal region/lower limb ([place])    "large c-cell" (05/26/2013)  . OSTEOARTHRITIS 05/03/2007  . PEPTIC ULCER DISEASE 07/06/2008  . Pneumonia 2013   "once" (05/26/2013)  . PUD (peptic ulcer disease)   . PVD (peripheral vascular disease) ([place])   . Seizures ([place])   . Stroke ([place]) 05/26/2013   "very mild; affected my speech for a while" (05/26/2013)  . VARICOSE VEINS LOWER EXTREMITIES W/INFLAMMATION 01/15/2009    PAST SURGICAL HISTORY: Past Surgical History:  Procedure Laterality Date  . ABDOMINAL EXPLORATION SURGERY  12/01/2012  . APPENDECTOMY  09/13/2008  . CAROTID ENDARTERECTOMY Bilateral 2004   2004 a month apart right and left  . CARPAL TUNNEL RELEASE Left ?1960's  . CATARACT EXTRACTION  2005   bilateral  . COLON SURGERY  09/13/2008   Laparoscopic assisted converted to open sigmoid colectomy.Archie Endo 09/13/2008 (05/26/2013)  . DILATION AND CURETTAGE OF UTERUS  1950's   "had 2 for miscarriages" (05/26/2013)  . DIVERTING ILEOSTOMY  09/22/2008   iliostomy for diverticulitis/notes 09/22/2008 (05/26/2013)  . HERNIA REPAIR  05/2010   ventral hernia repair  . ILEOSTOMY CLOSURE  01/2010  . RADICAL NECK DISSECTION  1986   "larynx cancer" (05/26/2013)  . TONSILLECTOMY AND ADENOIDECTOMY  1936   "tonsils grew back; no 2nd OR" (05/26/2013)  . TOTAL KNEE ARTHROPLASTY Bilateral    2002 left 2008 right  . TUBAL LIGATION  1972    MEDICATIONS AT HOME: Prior to Admission medications   Medication Sig Start Date End Date Taking? Authorizing Provider  atorvastatin (LIPITOR) 20 MG tablet TAKE 1 TABLET BY MOUTH ONCE DAILY Patient taking differently: Take 20 mg by mouth daily at 12 noon.  10/07/17  Yes Marin Olp, MD  bisoprolol (ZEBETA) 5 MG tablet TAKE 1 TABLET BY MOUTH ONCE DAILY Patient taking differently: Take 5 mg by mouth daily at 12 noon.  03/11/18  Yes Marin Olp, MD  busPIRone ([name]) 5 MG tablet Take 1 tablet (5 mg total) by mouth 2 (two) times daily as needed. Patient taking  differently: Take 5 mg by mouth 2 (two) times daily as needed (for anxiety).  03/11/18  Yes Marin Olp, MD  cholecalciferol (VITAMIN D) 1000 UNITS tablet Take 1,000 Units by mouth daily.   Yes [provider]  Cyanocobalamin (VITAMIN B-12 PO) Take 1 tablet by mouth daily.   Yes [provider]  gabapentin (NEURONTIN) 100 MG capsule TAKE ONE CAPSULE BY MOUTH 3 TIMES A DAY Patient taking differently: Take 100 mg by mouth 3 (three) times daily.  03/07/18  Yes Tomi Likens, Adam R, DO  lamoTRIgine ([name]) 100 MG tablet Take 1 tablet (100 mg total) by mouth 2 (two) times daily. 02/07/18  Yes Jaffe, Adam R, DO  levETIRAcetam (KEPPRA) 1000 MG tablet TAKE 1 TABLET BY MOUTH TWICE A DAY Patient taking differently: Take 1,000 mg by mouth 2 (two) times daily.  02/07/18  Yes Tomi Likens, Adam R, DO  loperamide (IMODIUM) 2 MG capsule Take 2 mg by mouth daily as needed for diarrhea or loose stools.    Yes [provider]  omeprazole (PRILOSEC) 20 MG capsule Take 20  mg by mouth daily.   Yes [provider]  triamcinolone cream (KENALOG) 0.1 % Apply 1 application topically 2 (two) times daily. For 7-10 days maximum Patient taking differently: Apply 1 application topically 2 (two) times daily as needed. Apply to affected, dry areas two times a day for 7-10 days maximum 03/11/18  Yes Marin Olp, MD  XARELTO 15 MG TABS tablet TAKE 1 TABLET BY MOUTH ONCE DAILY WITH SUPPER Patient taking differently: Take 15 mg by mouth daily with supper.  09/20/17  Yes Marin Olp, MD  OXYGEN 2lpm with sleep and occ during the day    [provider]    FAMILY HISTORY: Family History  Problem Relation Age of Onset  . Colon cancer Sister        colon  . Hyperlipidemia Mother   . Stroke Mother   . Cancer Father        mesothelioma   SOCIAL HISTORY:  reports that she quit smoking about 33 years ago. Her smoking use included cigarettes. She has a 92.50 pack-year smoking history. She has  never used smokeless tobacco. She reports that she does not drink alcohol or use drugs.  PHYSICAL EXAM: Blood pressure (!) 163/94, pulse 72, temperature (!) 97.5 F (36.4 C), resp. rate 15, SpO2 94 %.  General appearance :Awake, alert, not in any distress. Speech Clear. Not toxic Looking Eyes:, pupils equally reactive to light and accomodation,no scleral icterus.Pink conjunctiva HEENT: Atraumatic and Normocephalic Neck: supple, no JVD. No cervical lymphadenopathy. No thyromegaly Resp:Good air entry bilaterally, no added sounds  CVS: S1 S2 regular, no murmurs.  GI: Bowel sounds present, Non tender and not distended with no gaurding, rigidity or rebound.No organomegaly Extremities: B/L Lower Ext shows no edema, both legs are warm to touch Neurology:  speech clear,Non focal, sensation is grossly intact. Psychiatric: Normal judgment and insight. Alert and oriented x 3. Normal mood. Musculoskeletal:gait appears to be normal.No digital cyanosis Skin:No Rash, warm and dry Wounds:N/A  LABS ON ADMISSION:  I have personally reviewed following labs and imaging studies  CBC: Recent Labs  Lab 04/25/18 1227  WBC 5.7  HGB 12.4  HCT 41.5  MCV 93.7  PLT 191    Basic Metabolic Panel: Recent Labs  Lab 04/25/18 1227  NA 140  K 4.6  CL 103  CO2 27  GLUCOSE 119*  BUN 14  CREATININE 1.15*  CALCIUM 9.5    GFR: CrCl cannot be calculated (Unknown ideal weight.).  Liver Function Tests: No results for input(s): AST, ALT, ALKPHOS, BILITOT, PROT, ALBUMIN in the last 168 hours. No results for input(s): LIPASE, AMYLASE in the last 168 hours. No results for input(s): AMMONIA in the last 168 hours.  Coagulation Profile: No results for input(s): INR, PROTIME in the last 168 hours.  Cardiac Enzymes: No results for input(s): CKTOTAL, CKMB, CKMBINDEX, TROPONINI in the last 168 hours.  BNP (last 3 results) Recent Labs    02/22/18 1113  PROBNP 315.0*    HbA1C: No results for input(s):  HGBA1C in the last 72 hours.  CBG: No results for input(s): GLUCAP in the last 168 hours.  Lipid Profile: No results for input(s): CHOL, HDL, LDLCALC, TRIG, CHOLHDL, LDLDIRECT in the last 72 hours.  Thyroid Function Tests: No results for input(s): TSH, T4TOTAL, FREET4, T3FREE, THYROIDAB in the last 72 hours.  Anemia Panel: No results for input(s): VITAMINB12, FOLATE, FERRITIN, TIBC, IRON, RETICCTPCT in the last 72 hours.  Urine analysis:    Component Value Date/Time  COLORURINE YELLOW 04/30/2017 1000   APPEARANCEUR CLEAR 04/30/2017 1000   LABSPEC 1.011 04/30/2017 1000   PHURINE 6.0 04/30/2017 1000   GLUCOSEU NEGATIVE 04/30/2017 1000   HGBUR NEGATIVE 04/30/2017 1000   HGBUR trace-lysed 02/13/2010 0830   BILIRUBINUR Negative 02/22/2018 1541   KETONESUR NEGATIVE 04/30/2017 1000   PROTEINUR Positive (A) 02/22/2018 1541   PROTEINUR NEGATIVE 04/30/2017 1000   UROBILINOGEN 0.2 02/22/2018 1541   UROBILINOGEN 0.2 08/05/2014 1715   NITRITE Negative 02/22/2018 1541   NITRITE NEGATIVE 04/30/2017 1000   LEUKOCYTESUR Negative 02/22/2018 1541    Sepsis Labs: Lactic Acid, Venous    Component Value Date/Time   LATICACIDVEN 0.91 05/01/2017 0934     Microbiology: No results found for this or any previous visit (from the past 240 hour(s)).    RADIOLOGIC STUDIES ON ADMISSION: Dg Chest 2 View  Result Date: 04/25/2018 CLINICAL DATA:  Central chest pain dense last night EXAM: CHEST - 2 VIEW COMPARISON:  Chest x-rays dated 02/22/2018 and 06/14/2017. FINDINGS: Heart size and mediastinal contours are stable. Atherosclerotic changes noted at the aortic arch. Chronic bronchitic changes noted centrally. Lungs are hyperexpanded indicating COPD. There are stable chronic pleuroparenchymal scarring at the lung apices. No new confluent airspace opacity to suggest a developing pneumonia. No pleural effusion or pneumothorax seen. No acute or suspicious osseous finding. IMPRESSION: 1. No acute findings.   No evidence of pneumonia or pulmonary edema. 2. COPD and chronic bronchitic changes. Electronically Signed   By: Franki Cabot M.D.   On: 04/25/2018 13:07    I have personally reviewed images of chest xray-no infiltrates.   EKG:  Personally reviewed.  Normal sinus rhythm.  ASSESSMENT AND PLAN: Chest pain: Mostly atypical nature-is somewhat reproducible on firm palpation of the lower half of the sternum.  Will cycle enzymes-and monitor and a telemetry unit.  Keep n.p.o. postmidnight-and await further eval from cardiology in the morning.  PAF: Sinus rhythm-continue bisoprolol for rate control and Xarelto.  History of seizures: Resume Keppra, [name] and Neurontin.  Follows with [facility] in the outpatient setting  Hypertension: Appears uncontrolled this evening-as patient has not taken any antihypertensives-resume bisoprolol-use IV hydralazine as needed.  Dyslipidemia: Continue statin  Chronic diastolic heart failure: Euvolemic follow volume status  COPD: Stable without any evidence of exacerbation-lungs are completely clear.  She apparently is not on any inhalers-uses oxygen as needed  CKD stage III: Creatinine close to usual baseline-follow  Anxiety: Slightly anxious and her usual self-continue as needed [name]  Remote history of laryngeal cancer  History of lymphoma in remission-follows with Dr. Learta Codding  Further plan will depend as patient's clinical course evolves and further radiologic and laboratory data become available. Patient will be monitored closely.  Above noted plan was discussed with patient/spouse/daughter face to face at bedside, they were in agreement.   CONSULTS: None  DVT Prophylaxis: Xarelto  Code Status: Full Code  Disposition Plan:  Discharge back home-hopefully tomorrow-if work-up complete.  Admission status:Observation going to tele  Total time spent  55 minutes.Greater than 50% of this time was spent in counseling, explanation of  diagnosis, planning of further management, and coordination of care.  Oren Binet [facility] Pager [phone]  If 7PM-7AM, please contact night-coverage [email or web address] Password [facility] 04/25/2018, 5:56 PM

## 2018-04-25 NOTE — ED Notes (Addendum)
Pt ambulatory to bathroom with one assist. No reported issues during ambulating.

## 2018-04-25 NOTE — ED Triage Notes (Signed)
Pt states central chest pain non radiating that has been intermittent since last night. The pt denies any associated symptoms. Pain is currently a 3/10. Denies cough. The pain is described as mild

## 2018-04-25 NOTE — Progress Notes (Signed)
Patient arrived to [place] from ED. Patient alert and oriented. CCMD notified. Patient refusing 2 RN skin assessment and refuses bed alarm. Family present at bedside. Family and patient educated on reason for fall precautions. Patient wishes to refuses at this time. Will continue hourly rounding on patient. No there complaints at this time.

## 2018-04-25 NOTE — Telephone Encounter (Signed)
Spoke with patient who confirmed chest pain since last night. She denies shortness of breath or dizziness. I advised her to go to the ED at [facility]. She kept stating she didn't understand what I was saying. I called her daughter [employer] [facility] who is on the DPR and advised her to have her mother seen in the ED at [facility] for chest Pain. She verbalized understanding

## 2018-04-25 NOTE — Progress Notes (Signed)
Patient requesting to leave AMA. Daughter Shakora Nordquist called and informed. Daughter sates mother wishes to leave and she is on the way to pick up patient. IV removed. CCMD notified. Patient states has no pain at this time. AMA paperwork signed.

## 2018-04-25 NOTE — Progress Notes (Signed)
Patient refusing to have lab drawn. RN educated patient on reason for Troponins and reason for having it drawn every 6 hours. Patient states that she was lied to by the ED physician because she was told she didn't have to have any blood drawn. Forrest Moron paged and notified. Patient's daughter called to inform and have her talk to patient. Patient is insisting on leaving. Will notify Forrest Moron again of patient's wishes.

## 2018-04-25 NOTE — Progress Notes (Deleted)
NEUROLOGY FOLLOW UP OFFICE NOTE  [name] [id number]  HISTORY OF PRESENT ILLNESS: [name] is an 84 year old right-handed woman with hypertension, atrial fibrillation, peripheral vascular disease, anxiety, depression, COPD and history of B celll lymphoma status post chemotherapy who follows up for symptomatic seizure secondary to stroke.  She is accompanied by her husband who provides history.  UPDATE: Due to increased depression, in June we attempted to switch from [place] to lamotrigine.  However, she began to hallucinate, so she was tapered off of lamotrigine and back on Keppra.  PAST MEDICAL HISTORY: Past Medical History:  Diagnosis Date  . ABDOMINAL INCISIONAL HERNIA 04/03/2010      . Anxiety   . Arthritis    "lots; hands" (05/26/2013)  . Blood transfusion    "w/1st miscarriage" (05/26/2013)  . COPD (chronic obstructive pulmonary disease) ([place])   . Coronary artery disease   . Depression   . Diverticulitis of colon with hemorrhage 07/24/2008  . DVT (deep venous thrombosis) ([place]) 1954   "after I had my first child" (05/26/2013)  . Exertional shortness of breath   . Family history of anesthesia complication    "sisters also get sick as a dog" (05/26/2013)  . GERD 05/03/2007      . GERD (gastroesophageal reflux disease)   . H/O hiatal hernia   . HCAP (healthcare-associated pneumonia) 06/26/2013  . Heart murmur    "when I was a child" (05/26/2013)  . History of TIAs   . Hyperlipidemia   . Hypertension   . Laryngeal carcinoma ([place]) hx of ca   remotely with resection and xrt/chronic hoarsemenss  . Lymphona, mantle cell, inguinal region/lower limb ([place])    "large c-cell" (05/26/2013)  . OSTEOARTHRITIS 05/03/2007  . PEPTIC ULCER DISEASE 07/06/2008  . Pneumonia 2013   "once" (05/26/2013)  . PUD (peptic ulcer disease)   . PVD (peripheral vascular disease) ([place])   . Seizures ([place])   . Stroke ([place]) 05/26/2013   "very mild; affected my speech for a while"  (05/26/2013)  . VARICOSE VEINS LOWER EXTREMITIES W/INFLAMMATION 01/15/2009    MEDICATIONS: Current Outpatient Medications on File Prior to Visit  Medication Sig Dispense Refill  . atorvastatin (LIPITOR) 20 MG tablet TAKE 1 TABLET BY MOUTH ONCE DAILY 30 tablet 5  . bisoprolol (ZEBETA) 5 MG tablet TAKE 1 TABLET BY MOUTH ONCE DAILY 30 tablet 5  . busPIRone ([name]) 5 MG tablet Take 1 tablet (5 mg total) by mouth 2 (two) times daily as needed. 30 tablet 5  . cholecalciferol (VITAMIN D) 1000 UNITS tablet Take 1,000 Units by mouth daily.    . Cyanocobalamin (VITAMIN B-12 PO) Take 1 tablet by mouth daily.    . furosemide (LASIX) 20 MG tablet Take 1 tablet (20 mg total) by mouth daily for 5 days. 5 tablet 1  . gabapentin (NEURONTIN) 100 MG capsule TAKE ONE CAPSULE BY MOUTH 3 TIMES A DAY 270 capsule 1  . lamoTRIgine ([name]) 100 MG tablet Take 1 tablet (100 mg total) by mouth 2 (two) times daily. 60 tablet 1  . levETIRAcetam (KEPPRA) 1000 MG tablet TAKE 1 TABLET BY MOUTH TWICE A DAY 60 tablet 3  . loperamide (IMODIUM) 2 MG capsule Take 2 mg by mouth daily as needed for diarrhea or loose stools.     Marland Kitchen omeprazole (PRILOSEC) 20 MG capsule Take 20 mg by mouth daily.    . OXYGEN 2lpm with sleep and occ during the day    . predniSONE (DELTASONE) 20 MG  tablet Take 1 tablet by mouth daily for 5 days, then 1/2 tablet daily for 2 days 6 tablet 0  . triamcinolone cream (KENALOG) 0.1 % Apply 1 application topically 2 (two) times daily. For 7-10 days maximum 80 g 0  . XARELTO 15 MG TABS tablet TAKE 1 TABLET BY MOUTH ONCE DAILY WITH SUPPER 30 tablet 11   No current facility-administered medications on file prior to visit.     ALLERGIES: Allergies  Allergen Reactions  . Ativan [Lorazepam] Other (See Comments)    Severe hallucinations   . Propoxyphene Hcl   . Codeine Itching  . Penicillins Rash  . Vancomycin Rash    FAMILY HISTORY: Family History  Problem Relation Age of Onset  . Colon cancer Sister         colon  . Hyperlipidemia Mother   . Stroke Mother   . Cancer Father        mesothelioma   ***.  SOCIAL HISTORY: Social History   Socioeconomic History  . Marital status: Married    Spouse name: Not on file  . Number of children: 6  . Years of education: Not on file  . Highest education level: Not on file  Occupational History  . Occupation: retired    Fish farm manager: RETIRED  Social Needs  . Financial resource strain: Not on file  . Food insecurity:    Worry: Not on file    Inability: Not on file  . Transportation needs:    Medical: Not on file    Non-medical: Not on file  Tobacco Use  . Smoking status: Former Smoker    Packs/day: 2.50    Years: 37.00    Pack years: 92.50    Types: Cigarettes    Last attempt to quit: 09/05/1984    Years since quitting: 33.6  . Smokeless tobacco: Never Used  . Tobacco comment: quit when she was dx with cancer  Substance and Sexual Activity  . Alcohol use: No    Alcohol/week: 0.0 standard drinks  . Drug use: No  . Sexual activity: Never  Lifestyle  . Physical activity:    Days per week: Not on file    Minutes per session: Not on file  . Stress: Not on file  Relationships  . Social connections:    Talks on phone: Not on file    Gets together: Not on file    Attends religious service: Not on file    Active member of club or organization: Not on file    Attends meetings of clubs or organizations: Not on file    Relationship status: Not on file  . Intimate partner violence:    Fear of current or ex partner: Not on file    Emotionally abused: Not on file    Physically abused: Not on file    Forced sexual activity: Not on file  Other Topics Concern  . Not on file  Social History Narrative   Married (husband patient of Dr. Yong Channel). 6 children (lost 2 for total 8). 15 grandkids. 6 greatgrandchildren.    Lives with husband. Husband drives. Patient does not drive.       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     REVIEW OF SYSTEMS: Constitutional: No fevers, chills, or sweats, no generalized fatigue, change in appetite Eyes: No visual changes, double vision, eye pain Ear, nose and throat: No hearing loss, ear pain, nasal  congestion, sore throat Cardiovascular: No chest pain, palpitations Respiratory:  No shortness of breath at rest or with exertion, wheezes GastrointestinaI: No nausea, vomiting, diarrhea, abdominal pain, fecal incontinence Genitourinary:  No dysuria, urinary retention or frequency Musculoskeletal:  No neck pain, back pain Integumentary: No rash, pruritus, skin lesions Neurological: as above Psychiatric: No depression, insomnia, anxiety Endocrine: No palpitations, fatigue, diaphoresis, mood swings, change in appetite, change in weight, increased thirst Hematologic/Lymphatic:  No purpura, petechiae. Allergic/Immunologic: no itchy/runny eyes, nasal congestion, recent allergic reactions, rashes  PHYSICAL EXAM: *** General: No acute distress.  Patient appears ***-groomed.  *** body habitus. Head:  Normocephalic/atraumatic Eyes:  Fundi examined but not visualized Neck: supple, no paraspinal tenderness, full range of motion Heart:  Regular rate and rhythm Lungs:  Clear to auscultation bilaterally Back: No paraspinal tenderness Neurological Exam: alert and oriented to person, place, and time. Attention span and concentration intact, recent and remote memory intact, fund of knowledge intact.  Speech fluent and not dysarthric, language intact.  CN II-XII intact. Bulk and tone normal, muscle strength 5/5 throughout.  Sensation to light touch, temperature and vibration intact.  Deep tendon reflexes 2+ throughout, toes downgoing.  Finger to nose and heel to shin testing intact.  Gait normal, Romberg negative.  IMPRESSION: ***  PLAN: ***  Metta Clines, DO  CC: [name], MD

## 2018-04-25 NOTE — Telephone Encounter (Signed)
I called daughter [name] who states that she is going to take her mom to the ED and scheduled an ED follow up appt with Dr Yong Channel for 04/28/18.

## 2018-04-25 NOTE — Telephone Encounter (Signed)
Per CRM: Reason for CRM: [name], pts daughter, is at the pts home with her. She will not go to the ER for chest pain. [name] said it isn't her heart and she is wanting an appt with Dr. Yong Channel. She was advised of recommendation to go to the ER again. [facility]PC office requested high priority msg.  During hold time [name] was able to convince pt to go to the ER. I have scheduled ER f/u appt at [name]'s request for Thursday 9/26.

## 2018-04-25 NOTE — H&P (Signed)
HISTORY AND PHYSICAL       PATIENT DETAILS Name: [name] Age: 84 y.o. Sex: female Date of Birth: August 14, 1927 Admit Date: 04/25/2018 [id number], Brayton Mars, MD   Patient coming from: Home   CHIEF COMPLAINT:  Chest pain-x3 days  HPI: [name] is a 84 y.o. female with medical history significant of PAF on anticoagulation, seizure disorder on antiepileptics, anxiety, laryngeal cancer, lymphoma (in remission), COPD-on home O2 as needed-presenting with several days history of chest pain.  Per patient-pain is in the center of her chest-around 3/10 at its worst, intermittent in nature.  Pain is not related to any particular activity-and has occurred at rest.  Per patient pain last sometimes for a few minutes and spontaneously resolves.  There are no particular aggravating or relieving factors.  There is no associated nausea, vomiting, shortness of breath or diaphoresis.  Last night she had intermittent chest pain lasting a few minutes but ongoing for 2-3 hours.  She was subsequently brought to the hospital for further evaluation-in the hospital service was asked to admit the patient.  Patient denies any headache, ongoing chest pain during my evaluation.  She denies any history of fever, nausea vomiting, diarrhea or abdominal pain.  ED Course: Usual EKG/troponins negative  Note: Lives at: Home Mobility: Independent Chronic Indwelling Foley:no   REVIEW OF SYSTEMS:  Constitutional:   No  weight loss, night sweats,  Fevers, chills, fatigue.  HEENT:    No headaches, Dysphagia,Tooth/dental problems,Sore throat  Cardio-vascular: No Orthopnea, PND,lower extremity edema, anasarca, palpitations  GI:  No heartburn, indigestion, abdominal pain, nausea, vomiting, diarrhea, melena or hematochezia  Resp: No shortness of breath, cough, hemoptysis,plueritic chest pain.   Skin:  No rash or lesions.  GU:  No dysuria, change in color of urine, no urgency or  frequency.  No flank pain.  Musculoskeletal: No joint pain or swelling.  No decreased range of motion.  No back pain.  Endocrine: No heat intolerance, no cold intolerance, no polyuria, no polydipsia  Psych: No change in mood or affect. No depression or anxiety.  No memory loss.   ALLERGIES:   Allergies  Allergen Reactions  . Ativan [Lorazepam] Other (See Comments)    Severe hallucinations   . Propoxyphene Hcl Nausea And Vomiting  . Codeine Itching  . Penicillins Rash    Has patient had a PCN reaction causing immediate rash, facial/tongue/throat swelling, SOB or lightheadedness with hypotension: Yes Has patient had a PCN reaction causing severe rash involving mucus membranes or skin necrosis: Unk Has patient had a PCN reaction that required hospitalization: No Has patient had a PCN reaction occurring within the last 10 years: Yes If all of the above answers are "NO", then may proceed with Cephalosporin use.   . Vancomycin Rash    PAST MEDICAL HISTORY: Past Medical History:  Diagnosis Date  . ABDOMINAL INCISIONAL HERNIA 04/03/2010      . Anxiety   . Arthritis    "lots; hands" (05/26/2013)  . Blood transfusion    "w/1st miscarriage" (05/26/2013)  . COPD (chronic obstructive pulmonary disease) ([place])   . Coronary artery disease   . Depression   . Diverticulitis of colon with hemorrhage 07/24/2008  . DVT (deep venous thrombosis) ([place]) 1954   "after I had my first child" (05/26/2013)  . Exertional shortness of breath   . Family history of anesthesia complication    "sisters also get sick as a dog" (05/26/2013)  . GERD 05/03/2007      .  GERD (gastroesophageal reflux disease)   . H/O hiatal hernia   . HCAP (healthcare-associated pneumonia) 06/26/2013  . Heart murmur    "when I was a child" (05/26/2013)  . History of TIAs   . Hyperlipidemia   . Hypertension   . Laryngeal carcinoma ([place]) hx of ca   remotely with resection and xrt/chronic hoarsemenss  . Lymphona, mantle  cell, inguinal region/lower limb ([place])    "large c-cell" (05/26/2013)  . OSTEOARTHRITIS 05/03/2007  . PEPTIC ULCER DISEASE 07/06/2008  . Pneumonia 2013   "once" (05/26/2013)  . PUD (peptic ulcer disease)   . PVD (peripheral vascular disease) ([place])   . Seizures ([place])   . Stroke ([place]) 05/26/2013   "very mild; affected my speech for a while" (05/26/2013)  . VARICOSE VEINS LOWER EXTREMITIES W/INFLAMMATION 01/15/2009    PAST SURGICAL HISTORY: Past Surgical History:  Procedure Laterality Date  . ABDOMINAL EXPLORATION SURGERY  12/01/2012  . APPENDECTOMY  09/13/2008  . CAROTID ENDARTERECTOMY Bilateral 2004   2004 a month apart right and left  . CARPAL TUNNEL RELEASE Left ?1960's  . CATARACT EXTRACTION  2005   bilateral  . COLON SURGERY  09/13/2008   Laparoscopic assisted converted to open sigmoid colectomy.Archie Endo 09/13/2008 (05/26/2013)  . DILATION AND CURETTAGE OF UTERUS  1950's   "had 2 for miscarriages" (05/26/2013)  . DIVERTING ILEOSTOMY  09/22/2008   iliostomy for diverticulitis/notes 09/22/2008 (05/26/2013)  . HERNIA REPAIR  05/2010   ventral hernia repair  . ILEOSTOMY CLOSURE  01/2010  . RADICAL NECK DISSECTION  1986   "larynx cancer" (05/26/2013)  . TONSILLECTOMY AND ADENOIDECTOMY  1936   "tonsils grew back; no 2nd OR" (05/26/2013)  . TOTAL KNEE ARTHROPLASTY Bilateral    2002 left 2008 right  . TUBAL LIGATION  1972    MEDICATIONS AT HOME: Prior to Admission medications   Medication Sig Start Date End Date Taking? Authorizing Provider  atorvastatin (LIPITOR) 20 MG tablet TAKE 1 TABLET BY MOUTH ONCE DAILY Patient taking differently: Take 20 mg by mouth daily at 12 noon.  10/07/17  Yes Marin Olp, MD  bisoprolol (ZEBETA) 5 MG tablet TAKE 1 TABLET BY MOUTH ONCE DAILY Patient taking differently: Take 5 mg by mouth daily at 12 noon.  03/11/18  Yes Marin Olp, MD  busPIRone ([name]) 5 MG tablet Take 1 tablet (5 mg total) by mouth 2 (two) times daily as needed. Patient taking  differently: Take 5 mg by mouth 2 (two) times daily as needed (for anxiety).  03/11/18  Yes Marin Olp, MD  cholecalciferol (VITAMIN D) 1000 UNITS tablet Take 1,000 Units by mouth daily.   Yes [provider]  Cyanocobalamin (VITAMIN B-12 PO) Take 1 tablet by mouth daily.   Yes [provider]  gabapentin (NEURONTIN) 100 MG capsule TAKE ONE CAPSULE BY MOUTH 3 TIMES A DAY Patient taking differently: Take 100 mg by mouth 3 (three) times daily.  03/07/18  Yes Tomi Likens, Adam R, DO  lamoTRIgine ([name]) 100 MG tablet Take 1 tablet (100 mg total) by mouth 2 (two) times daily. 02/07/18  Yes Jaffe, Adam R, DO  levETIRAcetam (KEPPRA) 1000 MG tablet TAKE 1 TABLET BY MOUTH TWICE A DAY Patient taking differently: Take 1,000 mg by mouth 2 (two) times daily.  02/07/18  Yes Tomi Likens, Adam R, DO  loperamide (IMODIUM) 2 MG capsule Take 2 mg by mouth daily as needed for diarrhea or loose stools.    Yes [provider]  omeprazole (PRILOSEC) 20 MG capsule Take 20  mg by mouth daily.   Yes [provider]  triamcinolone cream (KENALOG) 0.1 % Apply 1 application topically 2 (two) times daily. For 7-10 days maximum Patient taking differently: Apply 1 application topically 2 (two) times daily as needed. Apply to affected, dry areas two times a day for 7-10 days maximum 03/11/18  Yes Marin Olp, MD  XARELTO 15 MG TABS tablet TAKE 1 TABLET BY MOUTH ONCE DAILY WITH SUPPER Patient taking differently: Take 15 mg by mouth daily with supper.  09/20/17  Yes Marin Olp, MD  OXYGEN 2lpm with sleep and occ during the day    [provider]    FAMILY HISTORY: Family History  Problem Relation Age of Onset  . Colon cancer Sister        colon  . Hyperlipidemia Mother   . Stroke Mother   . Cancer Father        mesothelioma   SOCIAL HISTORY:  reports that she quit smoking about 33 years ago. Her smoking use included cigarettes. She has a 92.50 pack-year smoking history. She has  never used smokeless tobacco. She reports that she does not drink alcohol or use drugs.  PHYSICAL EXAM: Blood pressure (!) 166/76, pulse (!) 59, temperature (!) 97.5 F (36.4 C), resp. rate 17, SpO2 94 %.  General appearance :Awake, alert, not in any distress. Speech Clear. Not toxic Looking Eyes:, pupils equally reactive to light and accomodation,no scleral icterus.Pink conjunctiva HEENT: Atraumatic and Normocephalic Neck: supple, no JVD. No cervical lymphadenopathy. No thyromegaly Resp:Good air entry bilaterally, no added sounds  CVS: S1 S2 regular, no murmurs.  GI: Bowel sounds present, Non tender and not distended with no gaurding, rigidity or rebound.No organomegaly Extremities: B/L Lower Ext shows no edema, both legs are warm to touch Neurology:  speech clear,Non focal, sensation is grossly intact. Psychiatric: Normal judgment and insight. Alert and oriented x 3. Normal mood. Musculoskeletal:gait appears to be normal.No digital cyanosis Skin:No Rash, warm and dry Wounds:N/A  LABS ON ADMISSION:  I have personally reviewed following labs and imaging studies  CBC: Recent Labs  Lab 04/25/18 1227  WBC 5.7  HGB 12.4  HCT 41.5  MCV 93.7  PLT 465    Basic Metabolic Panel: Recent Labs  Lab 04/25/18 1227  NA 140  K 4.6  CL 103  CO2 27  GLUCOSE 119*  BUN 14  CREATININE 1.15*  CALCIUM 9.5    GFR: CrCl cannot be calculated (Unknown ideal weight.).  Liver Function Tests: No results for input(s): AST, ALT, ALKPHOS, BILITOT, PROT, ALBUMIN in the last 168 hours. No results for input(s): LIPASE, AMYLASE in the last 168 hours. No results for input(s): AMMONIA in the last 168 hours.  Coagulation Profile: No results for input(s): INR, PROTIME in the last 168 hours.  Cardiac Enzymes: No results for input(s): CKTOTAL, CKMB, CKMBINDEX, TROPONINI in the last 168 hours.  BNP (last 3 results) Recent Labs    02/22/18 1113  PROBNP 315.0*    HbA1C: No results for  input(s): HGBA1C in the last 72 hours.  CBG: No results for input(s): GLUCAP in the last 168 hours.  Lipid Profile: No results for input(s): CHOL, HDL, LDLCALC, TRIG, CHOLHDL, LDLDIRECT in the last 72 hours.  Thyroid Function Tests: No results for input(s): TSH, T4TOTAL, FREET4, T3FREE, THYROIDAB in the last 72 hours.  Anemia Panel: No results for input(s): VITAMINB12, FOLATE, FERRITIN, TIBC, IRON, RETICCTPCT in the last 72 hours.  Urine analysis:    Component Value Date/Time  COLORURINE YELLOW 04/30/2017 1000   APPEARANCEUR CLEAR 04/30/2017 1000   LABSPEC 1.011 04/30/2017 1000   PHURINE 6.0 04/30/2017 1000   GLUCOSEU NEGATIVE 04/30/2017 1000   HGBUR NEGATIVE 04/30/2017 1000   HGBUR trace-lysed 02/13/2010 0830   BILIRUBINUR Negative 02/22/2018 1541   KETONESUR NEGATIVE 04/30/2017 1000   PROTEINUR Positive (A) 02/22/2018 1541   PROTEINUR NEGATIVE 04/30/2017 1000   UROBILINOGEN 0.2 02/22/2018 1541   UROBILINOGEN 0.2 08/05/2014 1715   NITRITE Negative 02/22/2018 1541   NITRITE NEGATIVE 04/30/2017 1000   LEUKOCYTESUR Negative 02/22/2018 1541    Sepsis Labs: Lactic Acid, Venous    Component Value Date/Time   LATICACIDVEN 0.91 05/01/2017 0934     Microbiology: No results found for this or any previous visit (from the past 240 hour(s)).    RADIOLOGIC STUDIES ON ADMISSION: Dg Chest 2 View  Result Date: 04/25/2018 CLINICAL DATA:  Central chest pain dense last night EXAM: CHEST - 2 VIEW COMPARISON:  Chest x-rays dated 02/22/2018 and 06/14/2017. FINDINGS: Heart size and mediastinal contours are stable. Atherosclerotic changes noted at the aortic arch. Chronic bronchitic changes noted centrally. Lungs are hyperexpanded indicating COPD. There are stable chronic pleuroparenchymal scarring at the lung apices. No new confluent airspace opacity to suggest a developing pneumonia. No pleural effusion or pneumothorax seen. No acute or suspicious osseous finding. IMPRESSION: 1. No acute  findings.  No evidence of pneumonia or pulmonary edema. 2. COPD and chronic bronchitic changes. Electronically Signed   By: Franki Cabot M.D.   On: 04/25/2018 13:07    I have personally reviewed images of chest xray-no infiltrates.   EKG:  Personally reviewed.  Normal sinus rhythm.  ASSESSMENT AND PLAN: Chest pain: Mostly atypical nature-is somewhat reproducible on firm palpation of the lower half of the sternum.  Will cycle enzymes-and monitor and a telemetry unit.  Keep n.p.o. postmidnight-and await further eval from cardiology in the morning.  PAF: Sinus rhythm-continue bisoprolol for rate control and Xarelto.  History of seizures: Resume Keppra, [name] and Neurontin.  Follows with [facility] in the outpatient setting  Hypertension: Appears uncontrolled this evening-as patient has not taken any antihypertensives-resume bisoprolol-use IV hydralazine as needed.  Dyslipidemia: Continue statin  Chronic diastolic heart failure: Euvolemic follow volume status  COPD: Stable without any evidence of exacerbation-lungs are completely clear.  She apparently is not on any inhalers-uses oxygen as needed  CKD stage III: Creatinine close to usual baseline-follow  Anxiety: Slightly anxious and her usual self-continue as needed [name]  Remote history of laryngeal cancer  History of lymphoma in remission-follows with Dr. Learta Codding  Further plan will depend as patient's clinical course evolves and further radiologic and laboratory data become available. Patient will be monitored closely.  Above noted plan was discussed with patient/spouse/daughter face to face at bedside, they were in agreement.   CONSULTS: None  DVT Prophylaxis: Xarelto  Code Status: Full Code  Disposition Plan:  Discharge back home-hopefully tomorrow-if work-up complete.  Admission status:Observation going to tele  Total time spent  55 minutes.Greater than 50% of this time was spent in counseling, explanation  of diagnosis, planning of further management, and coordination of care.  Oren Binet [facility] Pager [phone]  If 7PM-7AM, please contact night-coverage [email or web address] Password [facility] 04/25/2018, 8:32 PM

## 2018-04-26 ENCOUNTER — Encounter

## 2018-04-26 ENCOUNTER — Telehealth: Payer: Self-pay | Admitting: Family Medicine

## 2018-04-26 ENCOUNTER — Ambulatory Visit: Payer: Medicare Other | Admitting: Neurology

## 2018-04-26 ENCOUNTER — Telehealth: Payer: Self-pay | Admitting: *Deleted

## 2018-04-26 ENCOUNTER — Encounter: Payer: Self-pay | Admitting: *Deleted

## 2018-04-26 NOTE — Telephone Encounter (Signed)
Encounter created in error

## 2018-04-26 NOTE — Telephone Encounter (Signed)
Called and spoke with patients daughter [name] for Hospital Follow Up Call. Patient left AMA once admitted to the unit, no dc summary. She states she is concerned that patient may be getting dementia.  Transition Care Management Follow-up Telephone Call   Date discharged? 04/25/18   How have you been since you were released from the hospital? Daughter states she has not had any chest pain.   Do you understand why you were in the hospital? yes   Do you understand the discharge instructions? Patient left AMA, no dc paperwork.   Where were you discharged to? Home   Items Reviewed:  Medications reviewed: yes  Allergies reviewed: yes  Dietary changes reviewed: yes  Referrals reviewed: yes   Functional Questionnaire:   Activities of Daily Living (ADLs):   She states they are independent in the following: ambulation, bathing and hygiene, feeding, continence, grooming, toileting and dressing States they require assistance with the following: Husband and daughter assist    Any transportation issues/concerns?: no   Any patient concerns? no   Confirmed importance and date/time of follow-up visits scheduled yes  Provider Appointment booked with Dr Yong Channel 04/28/18 10:45  Confirmed with patient if condition begins to worsen call PCP or go to the ER.  Patient was given the office number and encouraged to call back with question or concerns.  : yes

## 2018-04-26 NOTE — Telephone Encounter (Signed)
Spoke to patient.  She is feeling much better and does not feel the need to come in for an appt.  Denies any chest pain, SOB or abdominal pain.

## 2018-04-26 NOTE — Telephone Encounter (Signed)
noted thanks  

## 2018-04-26 NOTE — Telephone Encounter (Signed)
Copied from [place] [id number]. Topic: Quick Communication - See Telephone Encounter >> Apr 26, 2018  3:47 PM Vernona Rieger wrote: CRM for notification. See Telephone encounter for: 04/26/18.  Pete, physical therapist with [facility] called and had planned to discharge her from home physical therapy. She went to hospital yesterday for chest pain and was admitted but then signed herself out last night because she got into an argument with the nurse. He is going to continue seeing her at home. He would like to finish seeing her for one time a week for two more weeks. He would like a nurse to call and check on her and see if she needs to come into the office. Call back @ [phone]

## 2018-04-28 ENCOUNTER — Ambulatory Visit (INDEPENDENT_AMBULATORY_CARE_PROVIDER_SITE_OTHER): Payer: Medicare Other | Admitting: Family Medicine

## 2018-04-28 ENCOUNTER — Encounter: Payer: Self-pay | Admitting: Family Medicine

## 2018-04-28 VITALS — BP 138/72 | HR 70 | Temp 98.4°F | Ht 63.0 in | Wt 183.2 lb

## 2018-04-28 DIAGNOSIS — R451 Restlessness and agitation: Secondary | ICD-10-CM | POA: Diagnosis not present

## 2018-04-28 DIAGNOSIS — I5032 Chronic diastolic (congestive) heart failure: Secondary | ICD-10-CM

## 2018-04-28 DIAGNOSIS — R0789 Other chest pain: Secondary | ICD-10-CM | POA: Diagnosis not present

## 2018-04-28 DIAGNOSIS — I700 Atherosclerosis of aorta: Secondary | ICD-10-CM

## 2018-04-28 DIAGNOSIS — J9611 Chronic respiratory failure with hypoxia: Secondary | ICD-10-CM

## 2018-04-28 DIAGNOSIS — R079 Chest pain, unspecified: Secondary | ICD-10-CM

## 2018-04-28 DIAGNOSIS — I480 Paroxysmal atrial fibrillation: Secondary | ICD-10-CM | POA: Diagnosis not present

## 2018-04-28 MED ORDER — BUSPIRONE HCL 5 MG PO TABS: 5.0000 mg | ORAL_TABLET | Freq: Two times a day (BID) | ORAL | 5 refills | Status: DC

## 2018-04-28 NOTE — Assessment & Plan Note (Signed)
Off CXR 06/30/16. Doubt this contributed to chest pain- continue to focus on risk factor modification

## 2018-04-28 NOTE — Assessment & Plan Note (Addendum)
Mild aortic stenosis Diastolic CHF Atypical chest pain- strongly doubt cardiac cause. Reproducible on exam today. With that being said- she is high risk given age and co morbidities- and she requests referral to Dr. Gwenlyn Found her husbands cardiologist and this referral was placed today  Patient states she also wants his opinion on the murmur she apparently has had since age 84, enlarged heart she states she was told about at age 13, and diastolic CHF/Hfpef. She is on lasix for this. She had an echocardiogram 2 years ago which shows mild aortic stenosis, mild mitral regurgitation, and diastolic dysfunction. I do not think these issues contributed to her pain

## 2018-04-28 NOTE — Assessment & Plan Note (Signed)
Continue xarelto 15mg  renally dosed for anticoagulation and bisoprolol for rate control- she is doing well

## 2018-04-28 NOTE — Assessment & Plan Note (Addendum)
Patient does very well on buspirone 5mg  when she agrees to take it but doesn't tend to take prn. We discussed scheduling this and after some counseling she opts in.   Of note- prior hallucinations resolved going off lamictal and back to keppra.

## 2018-04-28 NOTE — Progress Notes (Signed)
Subjective:  [name] is a 84 y.o. year old very pleasant female patient who presents for transitional care management and hospital follow up for atypical chest pain. Patient was hospitalized from April 25, 2018 to April 25, 2018-when patient left AGAINST MEDICAL ADVICE. A TCM phone call was completed on April 26, 2018. Medical complexity high.   [name] is an 84 year old female with a medical history of atrial fibrillation on anticoagulation, seizure disorder on antiepileptics, anxiety, lymphoma in remission, COPD-has but rarely uses home oxygen who presented to the emergency room with chest pain.  She described pain as center of her chest 3 out of 10 at its worst and intermittent in nature.  Pain could occur at rest or with exertion.  No clear triggers.  Patient denied per history and physical-nausea, vomiting, shortness of breath, diaphoresis above normal.  The night before she was hospitalized she had pain for 2 to 3 hours.  At the hospital she did have some somewhat reproducible pain with palpation of the lower half of sternum.  Troponin's were cycled and negative separated by 6 hours and negative. EKG showed Q wave in v1 and v2 which do not appear new.   Plan was for cardiology evaluation the following morning but as noted patient left AMA.Patient was noncompliant during visit-refusing RN skin assessments and refusing bed alarm. Patient states she left because nurse only wanted to put IV in her hand that she has some scarring from an IV placement years ago. Today, patient states chest pain without palpation resolved before she got to hospital and has not recurred. She does have some pain with palpation over left lower sternum still.   For her seizure history she was continued on Keppra, [name], Neurontin.  Her hypertension was uncontrolled at time of admission- they placed her on bisoprolol and used IV hydralazine as needed.  Blood pressure is controlled today on bisoprolol 5  mg  Hyperlipidemia- patient was continued on home statin-she remains on atorvastatin 20 mg  Chronic diastolic heart failure-patient appears euvolemic.  Continues to appear euvolemic  CKD stage III- maintained at baseline levels  For her atrial fibrillation-she was continued on Xarelto 15 mg renally dose as well as bisoprolol  Anxiety-she was continued on buspirone.  Patient has always been very demonstrative with her arms/hands. Very touchy since she was young. Angry levels seem to be peaking. Buspirone helps but just have to get her to take it- which has been difficult for family  I independently reviewed CXR from 04/25/18. She has some chronic scarring. Lungs are hyper expanded (known copd). No acute cardiopulmonary disease. Atherosclerosis noted along aortic arch.   See problem oriented charting as well ROS- no fever or chills. Pain only when pressing on chest. Baseline shortness of breath and edema.    Past Medical History-  Patient Active Problem List   Diagnosis Date Noted  . Chronic respiratory failure with hypoxia (HCC) 06/15/2017    Priority: High  . Diastolic CHF ([place]) 33/38/3291    Priority: High  . History of cardioembolic stroke 91/66/0600    Priority: High  . Spinal stenosis, lumbar region, with neurogenic claudication 05/09/2014    Priority: High  . History of Focal seizure ([place]) 06/16/2013    Priority: High  . TIA (transient ischemic attack) 05/31/2013    Priority: High  . History of Large B-cell lymphoma ([place]) 02/18/2012    Priority: High  . Atrial fibrillation ([place]) 12/14/2008    Priority: High  . Bacteremia due to Streptococcus  11/21/2015    Priority: Medium  . Cellulitis of leg, right 11/20/2015    Priority: Medium  . Diverticulitis of colon 09/26/2014    Priority: Medium  . CKD (chronic kidney disease), stage III ([place]) 09/26/2014    Priority: Medium  . Laryngeal carcinoma ([place])     Priority: Medium  . Anxiety state 10/20/2008    Priority: Medium   . Depression 10/20/2008    Priority: Medium  . Dyspnea on exertion 07/06/2008    Priority: Medium  . COPD GOLD II 03/26/2008    Priority: Medium  . Hyperlipidemia 03/18/2007    Priority: Medium  . Essential hypertension 03/18/2007    Priority: Medium  . Chronic diarrhea 09/04/2017    Priority: Low  . Aortic atherosclerosis ([place]) 06/30/2016    Priority: Low  . Weakness generalized 06/30/2013    Priority: Low  . Bilateral hand pain 01/09/2013    Priority: Low  . Chest pain 04/25/2018  . Agitation 03/11/2018    Medications- reviewed and updated  A medical reconciliation was performed comparing current medicines to hospital discharge medications. Current Outpatient Medications  Medication Sig Dispense Refill  . atorvastatin (LIPITOR) 20 MG tablet TAKE 1 TABLET BY MOUTH ONCE DAILY (Patient taking differently: Take 20 mg by mouth daily at 12 noon. ) 30 tablet 5  . bisoprolol (ZEBETA) 5 MG tablet TAKE 1 TABLET BY MOUTH ONCE DAILY (Patient taking differently: Take 5 mg by mouth daily at 12 noon. ) 30 tablet 5  . busPIRone ([name]) 5 MG tablet Take 1 tablet (5 mg total) by mouth 2 (two) times daily. 60 tablet 5  . cholecalciferol (VITAMIN D) 1000 UNITS tablet Take 1,000 Units by mouth daily.    . Cyanocobalamin (VITAMIN B-12 PO) Take 1 tablet by mouth daily.    Marland Kitchen gabapentin (NEURONTIN) 100 MG capsule TAKE ONE CAPSULE BY MOUTH 3 TIMES A DAY (Patient taking differently: Take 100 mg by mouth 3 (three) times daily. ) 270 capsule 1  . lamoTRIgine ([name]) 100 MG tablet Take 1 tablet (100 mg total) by mouth 2 (two) times daily. 60 tablet 1  . levETIRAcetam (KEPPRA) 1000 MG tablet TAKE 1 TABLET BY MOUTH TWICE A DAY (Patient taking differently: Take 1,000 mg by mouth 2 (two) times daily. ) 60 tablet 3  . loperamide (IMODIUM) 2 MG capsule Take 2 mg by mouth daily as needed for diarrhea or loose stools.     Marland Kitchen omeprazole (PRILOSEC) 20 MG capsule Take 20 mg by mouth daily.    . OXYGEN 2lpm with  sleep and occ during the day    . triamcinolone cream (KENALOG) 0.1 % Apply 1 application topically 2 (two) times daily. For 7-10 days maximum (Patient taking differently: Apply 1 application topically 2 (two) times daily as needed. Apply to affected, dry areas two times a day for 7-10 days maximum) 80 g 0  . XARELTO 15 MG TABS tablet TAKE 1 TABLET BY MOUTH ONCE DAILY WITH SUPPER (Patient taking differently: Take 15 mg by mouth daily with supper. ) 30 tablet 11   No current facility-administered medications for this visit.     Objective: BP 138/72 (BP Location: Left Arm, Patient Position: Sitting, Cuff Size: Large)   Pulse 70   Temp 98.4 F (36.9 C) (Oral)   Ht 5\' 3"  (1.6 m)   Wt 183 lb 3.2 oz (83.1 kg)   SpO2 92%   BMI 32.45 kg/m  Gen: NAD, resting comfortably CV: RRR no murmurs rubs or gallops Lungs: CTAB  no crackles, wheeze, rhonchi Abdomen: soft/nontender/nondistended/obese Ext: no edema Skin: warm, dry Psych: easily agitated  Assessment/Plan:  Chest pain Mild aortic stenosis Diastolic CHF Atypical chest pain- strongly doubt cardiac cause. Reproducible on exam today. With that being said- she is high risk given age and co morbidities- and she requests referral to Dr. Gwenlyn Found her husbands cardiologist and this referral was placed today  Patient states she also wants his opinion on the murmur she apparently has had since age 89, enlarged heart she states she was told about at age 21, and diastolic CHF/Hfpef. She is on lasix for this. She had an echocardiogram 2 years ago which shows mild aortic stenosis, mild mitral regurgitation, and diastolic dysfunction. I do not think these issues contributed to her pain   Agitation Patient does very well on buspirone 5mg  when she agrees to take it but doesn't tend to take prn. We discussed scheduling this and after some counseling she opts in.   Of note- prior hallucinations resolved going off lamictal and back to keppra.   Atrial  fibrillation (HCC) Continue xarelto 15mg  renally dosed for anticoagulation and bisoprolol for rate control- she is doing well  Aortic atherosclerosis ([place]) Off CXR 06/30/16 and again on recent x-ray. Doubt this contributed to chest pain- continue to focus on risk factor modification   Continue chronic medications for medical illnesses listed above. Continue oxygen for chronic respiratory failure when patient agrees to use it  Future Appointments  Date Time Provider [place]  01/12/2019 10:45 AM Owens Shark, NP [facility] None   lab/Order associations: Atypical chest pain - Plan: Ambulatory referral to Cardiology  Chest pain, unspecified type  Agitation  Paroxysmal atrial fibrillation ([place])  Aortic atherosclerosis (HCC)  Chronic diastolic congestive heart failure (HCC)  Chronic respiratory failure with hypoxia ([place])  Meds ordered this encounter  Medications  . busPIRone ([name]) 5 MG tablet    Sig: Take 1 tablet (5 mg total) by mouth 2 (two) times daily.    Dispense:  60 tablet    Refill:  5   Return precautions advised.  [name], MD

## 2018-04-28 NOTE — Patient Instructions (Addendum)
Health Maintenance Due  Topic Date Due  . INFLUENZA VACCINE - in about a month with husband  03/03/2018   We will call you within two weeks about your referral to cardiology Dr. Gwenlyn Found. If you do not hear within 3 weeks, give [place] a call.   Take buspirone 5mg  twice a day (very low dose) to help take the edge off from the stresses you have to deal with  See [place] back for new or worsening symptoms. I do think this is chest wall pain given your tenderness when we push on the chest wall

## 2018-05-04 NOTE — Discharge Summary (Signed)
PATIENT DETAILS Name: [name] Age: 84 y.o. Sex: female Date of Birth: 1927-10-07 MRN: [id number]. Admitting Physician: Jonetta Osgood, MD [id number], Brayton Mars, MD  Admit Date: 04/25/2018 Discharge date: 05/04/2018  Note:patient left AMA  Recommendations for Outpatient Follow-up:  1. Follow with PCP 2. Consider outpatient follow-up with cardiology  PRIMARY DISCHARGE DIAGNOSIS:  Principal Problem:   Chest pain Active Problems:   Hyperlipidemia   Anxiety state   Essential hypertension   COPD GOLD II   History of Focal seizure (HCC)   CKD (chronic kidney disease), stage III (HCC)   History of cardioembolic stroke   Diastolic CHF ([place])      PAST MEDICAL HISTORY: Past Medical History:  Diagnosis Date  . ABDOMINAL INCISIONAL HERNIA 04/03/2010      . Anxiety   . Arthritis    "lots; hands" (05/26/2013)  . Blood transfusion    "w/1st miscarriage" (05/26/2013)  . COPD (chronic obstructive pulmonary disease) ([place])   . Coronary artery disease   . Depression   . Diverticulitis of colon with hemorrhage 07/24/2008  . DVT (deep venous thrombosis) ([place]) 1954   "after I had my first child" (05/26/2013)  . Exertional shortness of breath   . Family history of anesthesia complication    "sisters also get sick as a dog" (05/26/2013)  . GERD 05/03/2007      . GERD (gastroesophageal reflux disease)   . H/O hiatal hernia   . HCAP (healthcare-associated pneumonia) 06/26/2013  . Heart murmur    "when I was a child" (05/26/2013)  . History of TIAs   . Hyperlipidemia   . Hypertension   . Laryngeal carcinoma ([place]) hx of ca   remotely with resection and xrt/chronic hoarsemenss  . Lymphona, mantle cell, inguinal region/lower limb ([place])    "large c-cell" (05/26/2013)  . OSTEOARTHRITIS 05/03/2007  . PEPTIC ULCER DISEASE 07/06/2008  . Pneumonia 2013   "once" (05/26/2013)  . PUD (peptic ulcer disease)   . PVD (peripheral vascular disease) ([place])   . Seizures ([place])   .  Stroke ([place]) 05/26/2013   "very mild; affected my speech for a while" (05/26/2013)  . VARICOSE VEINS LOWER EXTREMITIES W/INFLAMMATION 01/15/2009    ALLERGIES:   Allergies  Allergen Reactions  . Ativan [Lorazepam] Other (See Comments)    Severe hallucinations   . Propoxyphene Hcl Nausea And Vomiting  . Codeine Itching  . Penicillins Rash    Has patient had a PCN reaction causing immediate rash, facial/tongue/throat swelling, SOB or lightheadedness with hypotension: Yes Has patient had a PCN reaction causing severe rash involving mucus membranes or skin necrosis: Unk Has patient had a PCN reaction that required hospitalization: No Has patient had a PCN reaction occurring within the last 10 years: Yes If all of the above answers are "NO", then may proceed with Cephalosporin use.   . Vancomycin Rash    BRIEF HPI:  See H&P, Labs, Consult and Test reports for all details in brief, patient was admitted for evaluation of chest pain  CONSULTATIONS:   None  PERTINENT RADIOLOGIC STUDIES: Dg Chest 2 View  Result Date: 04/25/2018 CLINICAL DATA:  Central chest pain dense last night EXAM: CHEST - 2 VIEW COMPARISON:  Chest x-rays dated 02/22/2018 and 06/14/2017. FINDINGS: Heart size and mediastinal contours are stable. Atherosclerotic changes noted at the aortic arch. Chronic bronchitic changes noted centrally. Lungs are hyperexpanded indicating COPD. There are stable chronic pleuroparenchymal scarring at the lung apices. No new confluent airspace opacity to suggest a developing  pneumonia. No pleural effusion or pneumothorax seen. No acute or suspicious osseous finding. IMPRESSION: 1. No acute findings.  No evidence of pneumonia or pulmonary edema. 2. COPD and chronic bronchitic changes. Electronically Signed   By: Franki Cabot M.D.   On: 04/25/2018 13:07     PERTINENT LAB RESULTS: CBC: No results for input(s): WBC, HGB, HCT, PLT in the last 72 hours. CMET CMP     Component Value Date/Time    NA 140 04/25/2018 1227   NA 139 07/06/2012 0949   K 4.6 04/25/2018 1227   K 4.4 07/06/2012 0949   CL 103 04/25/2018 1227   CL 105 07/06/2012 0949   CO2 27 04/25/2018 1227   CO2 21 (L) 07/06/2012 0949   GLUCOSE 119 (H) 04/25/2018 1227   GLUCOSE 118 (H) 07/06/2012 0949   BUN 14 04/25/2018 1227   BUN 11.0 07/06/2012 0949   CREATININE 1.15 (H) 04/25/2018 1227   CREATININE 1.09 (H) 01/11/2018 1521   CREATININE 1.0 07/06/2012 0949   CALCIUM 9.5 04/25/2018 1227   CALCIUM 9.3 07/06/2012 0949   PROT 7.1 02/22/2018 1113   PROT 6.5 07/06/2012 0949   ALBUMIN 4.1 02/22/2018 1113   ALBUMIN 3.9 07/06/2012 0949   AST 13 02/22/2018 1113   AST 26 07/06/2012 0949   ALT 15 02/22/2018 1113   ALT 24 07/06/2012 0949   ALKPHOS 68 02/22/2018 1113   ALKPHOS 74 07/06/2012 0949   BILITOT 0.7 02/22/2018 1113   BILITOT 0.51 07/06/2012 0949   GFRNONAA 41 (L) 04/25/2018 1227   GFRAA 48 (L) 04/25/2018 1227    GFR Estimated Creatinine Clearance: 34.5 mL/min (A) (by C-G formula based on SCr of 1.15 mg/dL (H)). No results for input(s): LIPASE, AMYLASE in the last 72 hours. No results for input(s): CKTOTAL, CKMB, CKMBINDEX, TROPONINI in the last 72 hours. Invalid input(s): POCBNP No results for input(s): DDIMER in the last 72 hours. No results for input(s): HGBA1C in the last 72 hours. No results for input(s): CHOL, HDL, LDLCALC, TRIG, CHOLHDL, LDLDIRECT in the last 72 hours. No results for input(s): TSH, T4TOTAL, T3FREE, THYROIDAB in the last 72 hours.  Invalid input(s): FREET3 No results for input(s): VITAMINB12, FOLATE, FERRITIN, TIBC, IRON, RETICCTPCT in the last 72 hours. Coags: No results for input(s): INR in the last 72 hours.  Invalid input(s): PT Microbiology: No results found for this or any previous visit (from the past 240 hour(s)).   BRIEF HOSPITAL COURSE:  Patient is a 84 year old female with history of PAF on anticoagulation, seizure disorder on antiepileptics, COPD on home O2 was  admitted for evaluation of chest pain.  EKG was nonacute-troponins were negative.  Unfortunately before further evaluation could be done-she signed out [place].   TODAY-DAY OF DISCHARGE:  Subjective:   Shamonica Schadt today has signed out against medical advice Objective:   Blood pressure 97/88, pulse 78, temperature 97.9 F (36.6 C), temperature source Oral, resp. rate 19, height 5\' 3"  (1.6 m), weight 83 kg, SpO2 96 %.   DISCHARGE CONDITION: Not stable for discharge-left AMA  DISPOSITION: AMA   Follow with your PCP in 1 week   Total Time spent on discharge equals 25  minutes.  SignedOren Binet 05/04/2018 3:59 PM

## 2018-05-10 ENCOUNTER — Ambulatory Visit (INDEPENDENT_AMBULATORY_CARE_PROVIDER_SITE_OTHER): Payer: Medicare Other | Admitting: Cardiovascular Disease

## 2018-05-10 ENCOUNTER — Encounter: Payer: Self-pay | Admitting: Cardiovascular Disease

## 2018-05-10 DIAGNOSIS — I208 Other forms of angina pectoris: Secondary | ICD-10-CM

## 2018-05-10 DIAGNOSIS — E7800 Pure hypercholesterolemia, unspecified: Secondary | ICD-10-CM

## 2018-05-10 DIAGNOSIS — I480 Paroxysmal atrial fibrillation: Secondary | ICD-10-CM

## 2018-05-10 DIAGNOSIS — I5030 Unspecified diastolic (congestive) heart failure: Secondary | ICD-10-CM

## 2018-05-10 DIAGNOSIS — I10 Essential (primary) hypertension: Secondary | ICD-10-CM | POA: Diagnosis not present

## 2018-05-10 NOTE — Assessment & Plan Note (Signed)
History of diastolic heart failure with echo performed 4/17 revealing normal LV systolic function.

## 2018-05-10 NOTE — Patient Instructions (Signed)
Medication Instructions:  Your physician recommends that you continue on your current medications as directed. Please refer to the Current Medication list given to you today.  If you need a refill on your cardiac medications before your next appointment, please call your pharmacy.   Lab work: NONE If you have labs (blood work) drawn today and your tests are completely normal, you will receive your results only by: Marland Kitchen MyChart Message (if you have MyChart) OR . A paper copy in the mail If you have any lab test that is abnormal or we need to change your treatment, we will call you to review the results.  Testing/Procedures: NONE  Follow-Up: At [facility], you and your health needs are our priority.  As part of our continuing mission to provide you with exceptional heart care, we have created designated Provider Care Teams.  These Care Teams include your primary Cardiologist (physician) and Advanced Practice Providers (APPs -  Physician Assistants and Nurse Practitioners) who all work together to provide you with the care you need, when you need it. You will need a follow up appointment in 6 months.  Please call our office 2 months in advance to schedule this appointment.  You may see DR. BERRY or one of the following Advanced Practice Providers on your designated Care Team:   Kerin Ransom, PA-C [place], [place] . Sande Rives, PA-C  Any Other Special Instructions Will Be Listed Below (If Applicable).

## 2018-05-10 NOTE — Assessment & Plan Note (Signed)
Patient was recently seen in the ER for 1 day for atypical chest pain.  She has not had any subsequent to that.  At this point, I do not feel compelled to pursue functional testing.

## 2018-05-10 NOTE — Assessment & Plan Note (Signed)
History of hyperlipidemia on statin therapy with lipid profile performed remotely by her PCP.

## 2018-05-10 NOTE — Progress Notes (Signed)
05/10/2018 [name]   1927-12-16  [id number]  Primary Physician Yong Channel Brayton Mars, MD Primary Cardiologist: Lorretta Harp MD Lupe Carney, [place]  HPI:  [name] is a 84 y.o. overweight married Caucasian female mother of 73 living children, grandmother 80 grandchildren is accompanied by her husband Percell Miller who is also a patient of mine.  She was referred by Dr. Yong Channel for an episode of atypical chest pain.  She has a history of treated hypertension hyperlipidemia as well as PAF on Xarelto.  She quit smoking 32 years ago.  She is never had a heart attack but has had several strokes.  There is no family history of heart disease.  She had an episode of chest pain on September 23 but has had no subsequent episodes.   No outpatient medications have been marked as taking for the 05/10/18 encounter (Office Visit) with Lorretta Harp, MD.     Allergies  Allergen Reactions  . Ativan [Lorazepam] Other (See Comments)    Severe hallucinations   . Propoxyphene Hcl Nausea And Vomiting  . Codeine Itching  . Penicillins Rash    Has patient had a PCN reaction causing immediate rash, facial/tongue/throat swelling, SOB or lightheadedness with hypotension: Yes Has patient had a PCN reaction causing severe rash involving mucus membranes or skin necrosis: Unk Has patient had a PCN reaction that required hospitalization: No Has patient had a PCN reaction occurring within the last 10 years: Yes If all of the above answers are "NO", then may proceed with Cephalosporin use.   . Vancomycin Rash    Social History   Socioeconomic History  . Marital status: Married    Spouse name: Not on file  . Number of children: 6  . Years of education: Not on file  . Highest education level: Not on file  Occupational History  . Occupation: retired    Fish farm manager: RETIRED  Social Needs  . Financial resource strain: Patient refused  . Food insecurity:    Worry: Patient refused   Inability: Patient refused  . Transportation needs:    Medical: Patient refused    Non-medical: Patient refused  Tobacco Use  . Smoking status: Former Smoker    Packs/day: 2.50    Years: 37.00    Pack years: 92.50    Types: Cigarettes    Last attempt to quit: 09/05/1984    Years since quitting: 33.6  . Smokeless tobacco: Never Used  . Tobacco comment: quit when she was dx with cancer  Substance and Sexual Activity  . Alcohol use: No    Alcohol/week: 0.0 standard drinks  . Drug use: No  . Sexual activity: Not Currently  Lifestyle  . Physical activity:    Days per week: Patient refused    Minutes per session: Patient refused  . Stress: Patient refused  Relationships  . Social connections:    Talks on phone: Patient refused    Gets together: Patient refused    Attends religious service: Patient refused    Active member of club or organization: Patient refused    Attends meetings of clubs or organizations: Patient refused    Relationship status: Patient refused  . Intimate partner violence:    Fear of current or ex partner: Patient refused    Emotionally abused: Patient refused    Physically abused: Patient refused    Forced sexual activity: Patient refused  Other Topics Concern  . Not on file  Social History Narrative   Married (  husband patient of Dr. Yong Channel). 6 children (lost 2 for total 8). 15 grandkids. 6 greatgrandchildren.    Lives with husband. Husband drives. Patient does not drive.       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      Review of Systems: General: negative for chills, fever, night sweats or weight changes.  Cardiovascular: negative for chest pain, dyspnea on exertion, edema, orthopnea, palpitations, paroxysmal nocturnal dyspnea or shortness of breath Dermatological: negative for rash Respiratory: negative for cough or wheezing Urologic: negative for hematuria Abdominal: negative for nausea,  vomiting, diarrhea, bright red blood per rectum, melena, or hematemesis Neurologic: negative for visual changes, syncope, or dizziness All other systems reviewed and are otherwise negative except as noted above.    Blood pressure 138/66, pulse 82, height 5\' 4"  (1.626 m), weight 185 lb 9.6 oz (84.2 kg).  General appearance: alert and no distress Neck: no adenopathy, no carotid bruit, no JVD, supple, symmetrical, trachea midline and thyroid not enlarged, symmetric, no tenderness/mass/nodules Lungs: clear to auscultation bilaterally Heart: regular rate and rhythm, S1, S2 normal, no murmur, click, rub or gallop Extremities: extremities normal, atraumatic, no cyanosis or edema Pulses: 2+ and symmetric Skin: Skin color, texture, turgor normal. No rashes or lesions Neurologic: Alert and oriented X 3, normal strength and tone. Normal symmetric reflexes. Normal coordination and gait  EKG not performed today  ASSESSMENT AND PLAN:   Hyperlipidemia History of hyperlipidemia on statin therapy with lipid profile performed remotely by her PCP.  Essential hypertension History of essential hypertension her blood pressure measured at 138/66.  She is on Zebeta.  Continue current meds at current dosing.  Atrial fibrillation (HCC) History of paroxysmal atrial fib maintaining sinus rhythm on Xarelto oral anticoagulation  Diastolic CHF (HCC) History of diastolic heart failure with echo performed 4/17 revealing normal LV systolic function.  Chest pain Patient was recently seen in the ER for 1 day for atypical chest pain.  She has not had any subsequent to that.  At this point, I do not feel compelled to pursue functional testing.      Lorretta Harp MD FACP,FACC,FAHA, [facility] 05/10/2018 3:19 PM

## 2018-05-10 NOTE — Assessment & Plan Note (Signed)
History of paroxysmal atrial fib maintaining sinus rhythm on Xarelto oral anticoagulation

## 2018-05-10 NOTE — Assessment & Plan Note (Signed)
History of essential hypertension her blood pressure measured at 138/66.  She is on Zebeta.  Continue current meds at current dosing.

## 2018-05-19 ENCOUNTER — Encounter: Payer: Self-pay | Admitting: Family Medicine

## 2018-05-19 ENCOUNTER — Ambulatory Visit: Payer: Medicare Other | Admitting: Family Medicine

## 2018-05-19 VITALS — BP 134/82 | HR 81 | Temp 97.9°F | Resp 16 | Wt 183.0 lb

## 2018-05-19 DIAGNOSIS — R1084 Generalized abdominal pain: Secondary | ICD-10-CM | POA: Diagnosis not present

## 2018-05-19 NOTE — Patient Instructions (Addendum)
Take a half capful of miralax mixed with water each day. Go ahead and take when you get home. This will not be a quick fix- this will take about 2 days to work. My hope is that you are no longer straining and able to have good soft bowel movements. I want you to hold this if you are getting large volumes of watery stool but Im ok with some softer/looser stools.   Update me next week with how you are doing. Im hoping with softer, more regular bowel movements your abdominal pain resolves.

## 2018-05-19 NOTE — Progress Notes (Signed)
Subjective:  [name] is a 84 y.o. year old very pleasant female patient who presents for/with See problem oriented charting ROS- complains of abdominal pain relieved by BM. No nausea reported. No fever or chills. No increase in baseline SOB. No chest pain.    Past Medical History-  Patient Active Problem List   Diagnosis Date Noted  . Chronic respiratory failure with hypoxia (HCC) 06/15/2017    Priority: High  . Diastolic CHF ([place]) 21/30/8657    Priority: High  . History of cardioembolic stroke 84/69/6295    Priority: High  . Spinal stenosis, lumbar region, with neurogenic claudication 05/09/2014    Priority: High  . History of Focal seizure ([place]) 06/16/2013    Priority: High  . TIA (transient ischemic attack) 05/31/2013    Priority: High  . History of Large B-cell lymphoma ([place]) 02/18/2012    Priority: High  . Atrial fibrillation ([place]) 12/14/2008    Priority: High  . Bacteremia due to Streptococcus 11/21/2015    Priority: Medium  . Cellulitis of leg, right 11/20/2015    Priority: Medium  . Diverticulitis of colon 09/26/2014    Priority: Medium  . CKD (chronic kidney disease), stage III ([place]) 09/26/2014    Priority: Medium  . Laryngeal carcinoma ([place])     Priority: Medium  . Anxiety state 10/20/2008    Priority: Medium  . Depression 10/20/2008    Priority: Medium  . Dyspnea on exertion 07/06/2008    Priority: Medium  . COPD GOLD II 03/26/2008    Priority: Medium  . Hyperlipidemia 03/18/2007    Priority: Medium  . Essential hypertension 03/18/2007    Priority: Medium  . Chronic diarrhea 09/04/2017    Priority: Low  . Aortic atherosclerosis ([place]) 06/30/2016    Priority: Low  . Weakness generalized 06/30/2013    Priority: Low  . Bilateral hand pain 01/09/2013    Priority: Low  . Chest pain 04/25/2018  . Agitation 03/11/2018    Medications- reviewed and updated Current Outpatient Medications  Medication Sig Dispense Refill  . atorvastatin (LIPITOR)  20 MG tablet TAKE 1 TABLET BY MOUTH ONCE DAILY (Patient taking differently: Take 20 mg by mouth daily at 12 noon. ) 30 tablet 5  . bisoprolol (ZEBETA) 5 MG tablet TAKE 1 TABLET BY MOUTH ONCE DAILY (Patient taking differently: Take 5 mg by mouth daily at 12 noon. ) 30 tablet 5  . busPIRone ([name]) 5 MG tablet Take 1 tablet (5 mg total) by mouth 2 (two) times daily. 60 tablet 5  . cholecalciferol (VITAMIN D) 1000 UNITS tablet Take 1,000 Units by mouth daily.    . Cyanocobalamin (VITAMIN B-12 PO) Take 1 tablet by mouth daily.    Marland Kitchen gabapentin (NEURONTIN) 100 MG capsule TAKE ONE CAPSULE BY MOUTH 3 TIMES A DAY (Patient taking differently: Take 100 mg by mouth 3 (three) times daily. ) 270 capsule 1  . lamoTRIgine ([name]) 100 MG tablet Take 1 tablet (100 mg total) by mouth 2 (two) times daily. 60 tablet 1  . levETIRAcetam (KEPPRA) 1000 MG tablet TAKE 1 TABLET BY MOUTH TWICE A DAY (Patient taking differently: Take 1,000 mg by mouth 2 (two) times daily. ) 60 tablet 3  . loperamide (IMODIUM) 2 MG capsule Take 2 mg by mouth daily as needed for diarrhea or loose stools.     Marland Kitchen omeprazole (PRILOSEC) 20 MG capsule Take 20 mg by mouth daily.    . OXYGEN 2lpm with sleep and occ during the day    .  triamcinolone cream (KENALOG) 0.1 % Apply 1 application topically 2 (two) times daily. For 7-10 days maximum (Patient taking differently: Apply 1 application topically 2 (two) times daily as needed. Apply to affected, dry areas two times a day for 7-10 days maximum) 80 g 0  . XARELTO 15 MG TABS tablet TAKE 1 TABLET BY MOUTH ONCE DAILY WITH SUPPER (Patient taking differently: Take 15 mg by mouth daily with supper. ) 30 tablet 11   No current facility-administered medications for this visit.    Objective: BP 134/82   Pulse 81   Temp 97.9 F (36.6 C) (Oral)   Resp 16   Wt 183 lb (83 kg)   SpO2 91%   BMI 31.41 kg/m  Gen: NAD, resting comfortably CV: RRR no murmurs rubs or gallops Lungs: CTAB no crackles, wheeze,  rhonchi Abdomen: soft/nontender/nondistended/normal bowel sounds. No rebound or guarding.  Ext: no edema Skin: warm, dry Neuro: difficult to follow her line of thoughtat times  Assessment/Plan:  Abdominal pain relieved by bowel movements S: loose stools for the longest time for her surgery for diverticulitis (iliostomy for perforation). For the longest time she has had diarrhea and took imodium  Once daily up to 3x a week   Then over last 2 weeks has been constipated- harder time having bowel movement. Feels like she has to go- rushes to bathroom and nothing comes out for 2-3 days. Finally over last 4 days has been able to have a bowel movement but it is loose- she took an over the counter medicine to help her. Most of the day she is hurting and then has loose BM at end of day and has full relief of abdominal pain. Abdominal pain is diffuse, can be severe. Doesn't drink much water. Pretty sedentary. No blood in stools A/P: I strongly suspect constipation related in patient who has complete relief of pain with abdominal movements. I know stools are loose when come out but will loosen further by using miralax- gave sample today and she will use full packet today then half capful daily and update me next week. I discussed if she didn't have relief with BM would consider CT scan at present or in the future. Will consider bloodwork and CT abd/pelvis at follow up if not improving- doubt ischemic bowel given relief with BM. Doubt C. Diff given BM only in evening. Possible obstruction or mass causing change in bowel habits? Could also consider GI referral but dont think she would tolerate colonoscopy well   Future Appointments  Date Time Provider [place]  01/12/2019 10:45 AM Owens Shark, NP [facility] None    Return precautions advised.  [name], MD

## 2018-06-09 ENCOUNTER — Other Ambulatory Visit: Payer: Self-pay | Admitting: Neurology

## 2018-06-14 NOTE — Telephone Encounter (Signed)
Patient is calling in needing a prescription refill on the Keppra 1000mg  to the [facility] on [address]. Please send this. Thanks!

## 2018-06-15 NOTE — Telephone Encounter (Signed)
Called Pt, call would not connect. Called Pt's daughter Vania Rea. I advised her our last note about Keppra indicated we were changing to lamictal and d/c the Keppra. Vania Rea stated Dr Yong Channel spoke with Dr Tomi Likens and due to Pt having to many side effects and has gone back on Keppra 1000mg  BID and has d/c lamictal. Vania Rea is going to her parents house now and will have one of them call to confirm this.

## 2018-06-15 NOTE — Telephone Encounter (Signed)
Yes, I did discuss with Dr. Yong Channel to taper off lamotrigine and restart Keppra, titrating to 1000mg  twice daily.  So she should be on Keppra 1000mg  twice daily.

## 2018-06-16 ENCOUNTER — Telehealth: Payer: Self-pay | Admitting: Neurology

## 2018-06-16 ENCOUNTER — Ambulatory Visit (INDEPENDENT_AMBULATORY_CARE_PROVIDER_SITE_OTHER): Payer: Medicare Other

## 2018-06-16 ENCOUNTER — Other Ambulatory Visit: Payer: Self-pay | Admitting: *Deleted

## 2018-06-16 DIAGNOSIS — Z23 Encounter for immunization: Secondary | ICD-10-CM

## 2018-06-16 MED ORDER — LEVETIRACETAM 1000 MG PO TABS: 1000.0000 mg | ORAL_TABLET | Freq: Two times a day (BID) | ORAL | 1 refills | Status: DC

## 2018-06-16 NOTE — Telephone Encounter (Signed)
Refill sent.

## 2018-06-16 NOTE — Telephone Encounter (Signed)
Patient's husband called and left a vm to speak with you. Please call him back at [phone]. Thanks!

## 2018-06-17 NOTE — Telephone Encounter (Signed)
Called and advised [facility] for Keppra was sent in yesterday

## 2018-06-23 NOTE — Progress Notes (Signed)
NEUROLOGY FOLLOW UP OFFICE NOTE  [name] [id number]  HISTORY OF PRESENT ILLNESS: [name] is an 84 year old right-handed woman with hypertension, atrial fibrillation, peripheral vascular disease, anxiety, depression, COPD, lumbar spinal stenosis, and history of B-cell lymphoma status post chemotherapy who follows up for symptomatic seizure secondary to stroke.  She is accompanied by her husband who supplements history.  UPDATE:  Over the past year she has had significant depression related to her chronic pain, loss of independence (such as no longer able to drive), and the death of her daughter.  Back in June, we try to transition her from [name] to [name] in an attempt to alleviate some of her irritability.  She began experiencing increased confusion, falls, irritability and paranoia.  She was subsequently switched back from [name] to [name].  She is doing much better.  She was started on [name] to help with agitation, which may be helping as well.  She was admitted to the hospital in September for atypical chest pain.  Work-up revealed no acute cardiac event.  It was found to be due to pressure leaning on her walker.  She is taking gabapentin 200mg  at bedtime, which helps with back and leg pain.    HISTORY: I  SEIZURE DISORDER & CARDIOEMBOLIC STROKE: Since childhood, she has episodes of numbness in either of both hands and inability to get words out. It lasts for about 2 minutes. It should be known that she has had these episodes since childhood. Sometimes it was associated with headache. She had them about twice a year until young adulthood, when they resolved.  She has had several episodes of confusion and aphasia in October and November of 2014, associated with stroke and presumed seizure.  On 05/26/13, MRI of the brain was performed, which revealed an acute lacunar infarct in the left anterior corona radiata and lentiform nucleus.  CTA of the head revealed  atherosclerotic changes in the cavernous carotid arteries without significant stenosis.  CTA of neck revealed focal 50% stenosis of mid right ICA.  She was switched from [name] to Eliquis.  She was readmitted to the hospital on 05/31/13 with recurrent aphasia.  She had trouble getting her words out and then couldn't speak at all.  Repeat MRI of brain revealed new punctate infarct in the left posterior corona radiata, the same location as previous infarct. No changes in management were made.  She was readmitted to the hospital on 06/07/13 for altered mental status.  EEG was performed, showing left fronto-parieto-temporal theta slowing.  Due to possibility of seizure, she was started on Keppra 250mg  BID.  No change made to Eliquis.  She presented to the ED on 06/16/13 after waking up in the morning with expressive aphasia and right-sided weakness.  Symptoms improved in the ED.  CT head revealed prior lacunar infarct in left lentiform nucleus and corona radiata, but no new abnormalities.  It was suspected that she may have had another seizure and her Keppra was increased to 500mg  BID.  She was admitted to the hospital again on 06/21/13 again for expressive aphasia.  MRI brain revealed 2 new punctate infarcts in the bilateral hemispheres at level of centrum semiovale.  Eliquis was changed to Xarelto. Keppra was increased to 750mg  BID.    She presented to the ED on 08/05/14 for recurrent episode of aphasia, lasting 1.5 hours.  This was more significant than the previous year.  She described it as knowing what she wanted to say but having  trouble getting the words out.  She had trouble writing as well.  MRI of brain revealed no acute infarct.  She had an episode two days prior, lasting 1 hour.  Carotid doppler revealed no hemodynamically significant ICA stenosis.    She was admitted to [name] from 05/01/17 to 05/03/17 for altered mental status and generalized weakness with 2 seizures over the weekend.  She  was found by her daughter sitting in the dark moaning with foaming at the mouth and left sided shaking lasting a couple of minutes.  She was altered for much longer time.  CT of head from 04/30/17 and 05/01/17 revealed no acute changes. She was found to have a temperature of 100.3.  She was found to have Klebsiella UTI/cystitis on UA and culture.  She was loaded with Keppra and treated with Cipro.  Keppra was increased to 1000mg  twice daily.  About a week prior, she reportedly had another brief episode of jibberish speech and right facial droop, lasting 2-3 minutes.  She takes Xarelto for secondary stroke prevention, Lipitor for hyperlipidemia, and Zebeta and Lasix for hypertension.  II LUMBAR STENOSIS Both epidural injections and gabapentin have been ineffective.  She has had worsening back pain with neurogenic claudication.  She is unable to ambulate without a walker.  She reportedly had a repeat MRI of the lumbar spine which showed progression of stenosis and degeneration.  She saw a neurosurgeon, Dr. Ronnald Ramp, who would like to perform surgery.  I did inform them that discontinuing Xarelto for surgery would increase risk for stroke.    III RETINAL MIGRAINES: Of note, she has longstanding history of migraines, described as scotomas, fortification spectrum and flashing lights in the right eye. There is no associated headache.  PAST MEDICAL HISTORY: Past Medical History:  Diagnosis Date  . ABDOMINAL INCISIONAL HERNIA 04/03/2010      . Anxiety   . Arthritis    "lots; hands" (05/26/2013)  . Blood transfusion    "w/1st miscarriage" (05/26/2013)  . COPD (chronic obstructive pulmonary disease) ([place])   . Coronary artery disease   . Depression   . Diverticulitis of colon with hemorrhage 07/24/2008  . DVT (deep venous thrombosis) ([place]) 1952   "after I had my first child" (05/26/2013)  . Exertional shortness of breath   . Family history of anesthesia complication    "sisters also get sick as a dog"  (05/26/2013)  . GERD 05/03/2007      . GERD (gastroesophageal reflux disease)   . H/O hiatal hernia   . HCAP (healthcare-associated pneumonia) 06/26/2013  . Heart murmur    "when I was a child" (05/26/2013)  . History of TIAs   . Hyperlipidemia   . Hypertension   . Laryngeal carcinoma ([place]) hx of ca   remotely with resection and xrt/chronic hoarsemenss  . Lymphona, mantle cell, inguinal region/lower limb ([place])    "large c-cell" (05/26/2013)  . OSTEOARTHRITIS 05/03/2007  . PEPTIC ULCER DISEASE 07/06/2008  . Pneumonia 2013   "once" (05/26/2013)  . PUD (peptic ulcer disease)   . PVD (peripheral vascular disease) ([place])   . Seizures ([place])   . Stroke ([place]) 05/26/2013   "very mild; affected my speech for a while" (05/26/2013)  . VARICOSE VEINS LOWER EXTREMITIES W/INFLAMMATION 01/15/2009    MEDICATIONS: Current Outpatient Medications on File Prior to Visit  Medication Sig Dispense Refill  . atorvastatin (LIPITOR) 20 MG tablet TAKE 1 TABLET BY MOUTH ONCE DAILY (Patient taking differently: Take 20 mg by mouth daily at  12 noon. ) 30 tablet 5  . bisoprolol (ZEBETA) 5 MG tablet TAKE 1 TABLET BY MOUTH ONCE DAILY (Patient taking differently: Take 5 mg by mouth daily at 12 noon. ) 30 tablet 5  . busPIRone ([name]) 5 MG tablet Take 1 tablet (5 mg total) by mouth 2 (two) times daily. 60 tablet 5  . cholecalciferol (VITAMIN D) 1000 UNITS tablet Take 1,000 Units by mouth daily.    . Cyanocobalamin (VITAMIN B-12 PO) Take 1 tablet by mouth daily.    Marland Kitchen gabapentin (NEURONTIN) 100 MG capsule TAKE ONE CAPSULE BY MOUTH 3 TIMES A DAY (Patient taking differently: Take 100 mg by mouth 3 (three) times daily. ) 270 capsule 1  . lamoTRIgine ([name]) 100 MG tablet Take 1 tablet (100 mg total) by mouth 2 (two) times daily. 60 tablet 1  . levETIRAcetam (KEPPRA) 1000 MG tablet Take 1 tablet (1,000 mg total) by mouth 2 (two) times daily. 180 tablet 1  . loperamide (IMODIUM) 2 MG capsule Take 2 mg by mouth daily as  needed for diarrhea or loose stools.     Marland Kitchen omeprazole (PRILOSEC) 20 MG capsule Take 20 mg by mouth daily.    . OXYGEN 2lpm with sleep and occ during the day    . triamcinolone cream (KENALOG) 0.1 % Apply 1 application topically 2 (two) times daily. For 7-10 days maximum (Patient taking differently: Apply 1 application topically 2 (two) times daily as needed. Apply to affected, dry areas two times a day for 7-10 days maximum) 80 g 0  . XARELTO 15 MG TABS tablet TAKE 1 TABLET BY MOUTH ONCE DAILY WITH SUPPER (Patient taking differently: Take 15 mg by mouth daily with supper. ) 30 tablet 11   No current facility-administered medications on file prior to visit.     ALLERGIES: Allergies  Allergen Reactions  . Ativan [Lorazepam] Other (See Comments)    Severe hallucinations   . Propoxyphene Hcl Nausea And Vomiting  . Codeine Itching  . Penicillins Rash    Has patient had a PCN reaction causing immediate rash, facial/tongue/throat swelling, SOB or lightheadedness with hypotension: Yes Has patient had a PCN reaction causing severe rash involving mucus membranes or skin necrosis: Unk Has patient had a PCN reaction that required hospitalization: No Has patient had a PCN reaction occurring within the last 10 years: Yes If all of the above answers are "NO", then may proceed with Cephalosporin use.   . Vancomycin Rash    FAMILY HISTORY: Family History  Problem Relation Age of Onset  . Colon cancer Sister        colon  . Hyperlipidemia Mother   . Stroke Mother   . Cancer Father        mesothelioma    SOCIAL HISTORY: Social History   Socioeconomic History  . Marital status: Married    Spouse name: Not on file  . Number of children: 6  . Years of education: Not on file  . Highest education level: Not on file  Occupational History  . Occupation: retired    Fish farm manager: RETIRED  Social Needs  . Financial resource strain: Patient refused  . Food insecurity:    Worry: Patient refused     Inability: Patient refused  . Transportation needs:    Medical: Patient refused    Non-medical: Patient refused  Tobacco Use  . Smoking status: Former Smoker    Packs/day: 2.50    Years: 37.00    Pack years: 92.50    Types: Cigarettes  Last attempt to quit: 09/05/1984    Years since quitting: 33.8  . Smokeless tobacco: Never Used  . Tobacco comment: quit when she was dx with cancer  Substance and Sexual Activity  . Alcohol use: No    Alcohol/week: 0.0 standard drinks  . Drug use: No  . Sexual activity: Not Currently  Lifestyle  . Physical activity:    Days per week: Patient refused    Minutes per session: Patient refused  . Stress: Patient refused  Relationships  . Social connections:    Talks on phone: Patient refused    Gets together: Patient refused    Attends religious service: Patient refused    Active member of club or organization: Patient refused    Attends meetings of clubs or organizations: Patient refused    Relationship status: Patient refused  . Intimate partner violence:    Fear of current or ex partner: Patient refused    Emotionally abused: Patient refused    Physically abused: Patient refused    Forced sexual activity: Patient refused  Other Topics Concern  . Not on file  Social History Narrative   Married (husband patient of Dr. Yong Channel). 6 children (lost 2 for total 8). 15 grandkids. 6 greatgrandchildren.    Lives with husband. Husband drives. Patient does not drive.       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     REVIEW OF SYSTEMS: Constitutional: No fevers, chills, or sweats, no generalized fatigue, change in appetite Eyes: No visual changes, double vision, eye pain Ear, nose and throat: No hearing loss, ear pain, nasal congestion, sore throat Cardiovascular: No chest pain, palpitations Respiratory:  No shortness of breath at rest or with exertion, wheezes GastrointestinaI: No nausea,  vomiting, diarrhea, abdominal pain, fecal incontinence Genitourinary:  No dysuria, urinary retention or frequency Musculoskeletal: Back pain Integumentary: No rash, pruritus, skin lesions Neurological: as above Psychiatric: Depression Endocrine: No palpitations, fatigue, diaphoresis, mood swings, change in appetite, change in weight, increased thirst Hematologic/Lymphatic:  No purpura, petechiae. Allergic/Immunologic: no itchy/runny eyes, nasal congestion, recent allergic reactions, rashes  PHYSICAL EXAM: Blood pressure 122/74, pulse 80, height 5\' 4"  (1.626 m), weight 187 lb (84.8 kg), SpO2 91 %. General: No acute distress.  Patient appears well-groomed.  Head:  Normocephalic/atraumatic Eyes:  Fundi examined but not visualized Neck: supple, no paraspinal tenderness, full range of motion Heart:  Regular rate and rhythm Lungs:  Clear to auscultation bilaterally Back: No paraspinal tenderness Neurological Exam: Alert and oriented to person, place, and time.  Attention span and concentration intact.  Recent and remote history intact, fund of knowledge intact.  Speech fluent and not dysarthric, language intact.  CN II-XII intact.  Bulk and tone normal, muscle strength 5/5 throughout.  Sensation to light touch intact.  Deep tendon reflexes trace throughout.  Finger-to-nose testing with postural and kinetic tremor.  Unsteady wide-based gait.  IMPRESSION: 1.  Localization-related symptomatic epilepsy. 2.  History of cardioembolic stroke 3.  Lumbar spinal stenosis  PLAN: 1.  Continue Keppra 1000 mg twice daily for seizure prophylaxis 2.  Continue gabapentin 200 mg at bedtime.  If she experiences increased back and leg pain during the day, she may add 100 mg during the day.  Caution for dizziness. 3.  Follow-up in 6 months.  25 minutes spent with the patient and her husband, over 50% spent discussing management.  Metta Clines, DO  CC: Brayton Mars. Yong Channel, MD

## 2018-06-24 ENCOUNTER — Encounter: Payer: Self-pay | Admitting: Neurology

## 2018-06-24 ENCOUNTER — Ambulatory Visit (INDEPENDENT_AMBULATORY_CARE_PROVIDER_SITE_OTHER): Payer: Medicare Other | Admitting: Neurology

## 2018-06-24 VITALS — BP 122/74 | HR 80 | Ht 64.0 in | Wt 187.0 lb

## 2018-06-24 DIAGNOSIS — M48062 Spinal stenosis, lumbar region with neurogenic claudication: Secondary | ICD-10-CM

## 2018-06-24 DIAGNOSIS — F329 Major depressive disorder, single episode, unspecified: Secondary | ICD-10-CM | POA: Diagnosis not present

## 2018-06-24 DIAGNOSIS — F32A Depression, unspecified: Secondary | ICD-10-CM

## 2018-06-24 DIAGNOSIS — G40109 Localization-related (focal) (partial) symptomatic epilepsy and epileptic syndromes with simple partial seizures, not intractable, without status epilepticus: Secondary | ICD-10-CM

## 2018-06-24 DIAGNOSIS — I639 Cerebral infarction, unspecified: Secondary | ICD-10-CM | POA: Diagnosis not present

## 2018-06-24 NOTE — Patient Instructions (Addendum)
1.  Continue levetiracetam (Keppra) 1000mg  twice daily 2.  Continue gabapentin 200mg  at bedtime.  If you start feeling increased back and leg pain during the day, you may add 100mg  during the day.  Caution for dizziness. 3.  Follow up in 6 months.

## 2018-07-08 ENCOUNTER — Other Ambulatory Visit: Payer: Self-pay | Admitting: Family Medicine

## 2018-08-15 ENCOUNTER — Encounter: Payer: Self-pay | Admitting: Family Medicine

## 2018-08-15 ENCOUNTER — Ambulatory Visit (INDEPENDENT_AMBULATORY_CARE_PROVIDER_SITE_OTHER): Payer: Medicare Other | Admitting: Family Medicine

## 2018-08-15 ENCOUNTER — Encounter

## 2018-08-15 DIAGNOSIS — K219 Gastro-esophageal reflux disease without esophagitis: Secondary | ICD-10-CM

## 2018-08-15 DIAGNOSIS — R451 Restlessness and agitation: Secondary | ICD-10-CM

## 2018-08-15 DIAGNOSIS — M48062 Spinal stenosis, lumbar region with neurogenic claudication: Secondary | ICD-10-CM

## 2018-08-15 MED ORDER — KETOCONAZOLE 2 % EX CREA: 1.0000 "application " | TOPICAL_CREAM | Freq: Every day | CUTANEOUS | 0 refills | Status: DC

## 2018-08-15 NOTE — Progress Notes (Signed)
Subjective:  [name] is a 85 y.o. year old very pleasant female patient who presents for/with See problem oriented charting ROS-complains of some memory issues.  Rash on the left chest.  Occasionally gets some burning in her chest after eating or when laying down at night after eating.  No new shortness of breath reported-continues to have baseline shortness of breath.  Past Medical History-  Patient Active Problem List   Diagnosis Date Noted  . Chronic respiratory failure with hypoxia (HCC) 06/15/2017    Priority: High  . Diastolic CHF ([place]) 93/81/8299    Priority: High  . History of cardioembolic stroke 37/16/9678    Priority: High  . Spinal stenosis, lumbar region, with neurogenic claudication 05/09/2014    Priority: High  . History of Focal seizure ([place]) 06/16/2013    Priority: High  . TIA (transient ischemic attack) 05/31/2013    Priority: High  . History of Large B-cell lymphoma ([place]) 02/18/2012    Priority: High  . Atrial fibrillation ([place]) 12/14/2008    Priority: High  . Agitation 03/11/2018    Priority: Medium  . Bacteremia due to Streptococcus 11/21/2015    Priority: Medium  . Cellulitis of leg, right 11/20/2015    Priority: Medium  . Diverticulitis of colon 09/26/2014    Priority: Medium  . CKD (chronic kidney disease), stage III ([place]) 09/26/2014    Priority: Medium  . Laryngeal carcinoma ([place])     Priority: Medium  . Anxiety state 10/20/2008    Priority: Medium  . Depression 10/20/2008    Priority: Medium  . Dyspnea on exertion 07/06/2008    Priority: Medium  . COPD GOLD II 03/26/2008    Priority: Medium  . Hyperlipidemia 03/18/2007    Priority: Medium  . Essential hypertension 03/18/2007    Priority: Medium  . Chronic diarrhea 09/04/2017    Priority: Low  . Aortic atherosclerosis ([place]) 06/30/2016    Priority: Low  . Weakness generalized 06/30/2013    Priority: Low  . Bilateral hand pain 01/09/2013    Priority: Low  . Chest pain 04/25/2018   . GERD (gastroesophageal reflux disease) 05/03/2007    Medications- reviewed and updated Current Outpatient Medications  Medication Sig Dispense Refill  . atorvastatin (LIPITOR) 20 MG tablet TAKE 1 TABLET BY MOUTH ONCE DAILY (Patient taking differently: Take 20 mg by mouth daily at 12 noon. ) 30 tablet 5  . bisoprolol (ZEBETA) 5 MG tablet TAKE 1 TABLET BY MOUTH ONCE DAILY (Patient taking differently: Take 5 mg by mouth daily at 12 noon. ) 30 tablet 5  . busPIRone ([name]) 5 MG tablet Take 1 tablet (5 mg total) by mouth 2 (two) times daily. (Patient taking differently: Take 5 mg by mouth daily. ) 60 tablet 5  . cholecalciferol (VITAMIN D) 1000 UNITS tablet Take 1,000 Units by mouth daily.    . Cyanocobalamin (VITAMIN B-12 PO) Take 1 tablet by mouth daily.    Marland Kitchen gabapentin (NEURONTIN) 100 MG capsule TAKE ONE CAPSULE BY MOUTH 3 TIMES A DAY (Patient taking differently: Take 100 mg by mouth 3 (three) times daily. ) 270 capsule 1  . levETIRAcetam (KEPPRA) 1000 MG tablet Take 1 tablet (1,000 mg total) by mouth 2 (two) times daily. 180 tablet 1  . loperamide (IMODIUM) 2 MG capsule Take 2 mg by mouth daily as needed for diarrhea or loose stools.     Marland Kitchen omeprazole (PRILOSEC) 20 MG capsule Take 20 mg by mouth daily.    . OXYGEN 2lpm with sleep  and occ during the day    . triamcinolone cream (KENALOG) 0.1 % APPLY 1 APPLICATION TO AFFECTED AREA TWICE DAILY EXTERNALLY FOR 7-10 DAYS MAXIMUM 80 g 0  . XARELTO 15 MG TABS tablet TAKE 1 TABLET BY MOUTH ONCE DAILY WITH SUPPER (Patient taking differently: Take 15 mg by mouth daily with supper. ) 30 tablet 11   No current facility-administered medications for this visit.     Objective: BP 108/60 (BP Location: Left Arm, Patient Position: Sitting, Cuff Size: Large)   Pulse 79   Temp 97.9 F (36.6 C) (Oral)   Ht 5\' 4"  (1.626 m)   Wt 187 lb 6.4 oz (85 kg)   SpO2 90%   BMI 32.17 kg/m  Gen: NAD, resting comfortably CV: RRR no murmurs rubs or gallops Lungs:  CTAB no crackles, wheeze, rhonchi Ext: no edema Skin: warm, dry, on left upper chest there is a 1 to 2 x 1 to 2 mm erythematous lesion with slightly raised border though no central clearing. Neuro: Seems somewhat tangential in thought.  Easily agitated with husband  Assessment/Plan:  Other notes: 1. almost circular rash on chest- not getting better with triple antibiotic ointment. Also tried triamcinolone which didn't help.   - We will trial ketoconazole in case this is ringworm - If does not resolve on this can consider small punch biopsy such as 3 mm or 4 mm - As an alternate could also offer dermatology referral at follow-up if not improving 2.  Continues to have some issues with her memory-we are going to have her back for an updated MMSE at next visit  Spinal stenosis, lumbar region, with neurogenic claudication S: Patient has a history of spinal stenosis and lumbar spine with neurogenic claudication.  She comes in today because she doesn't have much sensation in the bottom of her feet and she is wondering if there is anything new for that. Her feet have been numb for years. Also mild burning in her hands. Also has some issues with constipation in recent days after she took an immodium last week for loose stools.  She does not report any fecal incontinence today  Lyrica caused confusion when used in the past. Seems to be some tension from husband  A/P: Patient's pain is not at the forefront of her mind right now.  She wanted to know if there is anything for the numbness and tingling sensation in the bottom of her feet- I told her I did not know of any new advances.  We will continue to monitor- condition is largely stable   Agitation S: she cut down buspirone from twice a day to once a day. Felt sleepier on this but reducing pill didn't help. Husband thinks patient may simply be agitated from having to take pills- she seems to get frustrated with having to take pills. Was taking 8 am and  noon pill, now just taking noon pill.  A/P: Largely stable despite decreasing buspirone-patient actually seems less agitated as she likes to take fewer pills-we agreed to continue current dose for now- we may ultimately ultimately increase back to twice daily in the future  GERD (gastroesophageal reflux disease) S: some reflux after meals and with laying down-denies exertional chest pain at present.  Has baseline shortness of breath  Has pepcid at home as well as omeprazole-she has not tried these for her symptoms recently A/P: Mild poor control- we discussed Pepcid is likely the safer option-can use omeprazole as backup   Future Appointments  Date Time Provider [place]  01/02/2019  1:30 PM Pieter Partridge, DO [facility] None  01/12/2019 10:45 AM Owens Shark, NP [facility] None   I advised 1-2 month check and for memory testing  Meds ordered this encounter  Medications  . ketoconazole (NIZORAL) 2 % cream    Sig: Apply 1 application topically daily.    Dispense:  15 g    Refill:  0    Return precautions advised.  [name], MD

## 2018-08-15 NOTE — Assessment & Plan Note (Signed)
S: some reflux after meals and with laying down-denies exertional chest pain at present.  Has baseline shortness of breath  Has pepcid at home as well as omeprazole-she has not tried these for her symptoms recently A/P: Mild poor control- we discussed Pepcid is likely the safer option-can use omeprazole as backup

## 2018-08-15 NOTE — Assessment & Plan Note (Signed)
S: she cut down buspirone from twice a day to once a day. Felt sleepier on this but reducing pill didn't help. Husband thinks patient may simply be agitated from having to take pills- she seems to get frustrated with having to take pills. Was taking 8 am and noon pill, now just taking noon pill.  A/P: Largely stable despite decreasing buspirone-patient actually seems less agitated as she likes to take fewer pills-we agreed to continue current dose for now- we may ultimately ultimately increase back to twice daily in the future

## 2018-08-15 NOTE — Patient Instructions (Signed)
Thanks for coming in today!  I am sorry we do not have any new medicines to help with the numbness in the feet.  When you are dealing with acid reflux you could certainly trial omeprazole/Prilosec-Pepcid is likely a safer option in regards to potential side effects  I think it is okay to go with buspirone once a day-if you feel like you are getting more agitated you can always go back up to twice a day  Trial ketoconazole consistently for at least 3 weeks on the chest.  We can recheck this when we see you back.  Schedule a visit in 1 to 2 months so we can evaluate your memory further.  I am going to do some testing at that time and I really want a focus on your memory at that visit

## 2018-08-15 NOTE — Assessment & Plan Note (Signed)
S: Patient has a history of spinal stenosis and lumbar spine with neurogenic claudication.  She comes in today because she doesn't have much sensation in the bottom of her feet and she is wondering if there is anything new for that. Her feet have been numb for years. Also mild burning in her hands. Also has some issues with constipation in recent days after she took an immodium last week for loose stools.  She does not report any fecal incontinence today  Lyrica caused confusion when used in the past. Seems to be some tension from husband  A/P: Patient's pain is not at the forefront of her mind right now.  She wanted to know if there is anything for the numbness and tingling sensation in the bottom of her feet- I told her I did not know of any new advances.  We will continue to monitor- condition is largely stable

## 2018-09-15 ENCOUNTER — Other Ambulatory Visit: Payer: Self-pay | Admitting: Family Medicine

## 2018-09-20 ENCOUNTER — Ambulatory Visit: Payer: Medicare Other | Admitting: Family Medicine

## 2018-09-20 ENCOUNTER — Encounter: Payer: Self-pay | Admitting: Family Medicine

## 2018-09-20 VITALS — BP 120/80 | Temp 97.7°F | Ht 64.0 in | Wt 187.6 lb

## 2018-09-20 DIAGNOSIS — F3341 Major depressive disorder, recurrent, in partial remission: Secondary | ICD-10-CM

## 2018-09-20 DIAGNOSIS — Z711 Person with feared health complaint in whom no diagnosis is made: Secondary | ICD-10-CM | POA: Diagnosis not present

## 2018-09-20 NOTE — Patient Instructions (Addendum)
Let me know if you change your mind on either of the following 1. Submitting stool for ova and parasite/worm testing 2. If you would like to trial low dose zoloft 25mg  to see if that would help with your depressed mood- we certainly want to help you feel better. You could also try counseling

## 2018-09-20 NOTE — Assessment & Plan Note (Signed)
S: reports Depression- cant go anywhere, cant do anything. Hurts too much.  A/P: elevated phq9 today over 5 but under 10. Discussed adding zoloft 25mg  or counseling- she declines both for now bu tagrees to call if changes mind or if she has worsening symptoms. Could continue buspirone if start that- has noted some mild agitation benefit with that

## 2018-09-20 NOTE — Progress Notes (Signed)
Phone [phone]   Subjective:  [name] is a 85 y.o. year old very pleasant female patient who presents for/with See problem oriented charting ROS- some alternating diarrhea/constipation . Reports some stool elements that concern her for worms. No fever, chills. Frustration with not driving.   BMI monitoring- elevated BMI noted: Body mass index is 32.2 kg/m.  BMI Metric Follow Up - 09/20/18 2309      BMI Metric Follow Up-Please document annually   Contraindication to BMI Follow Up:  Pt 65 or older for whom wt reduction/gain would complicate other underlying health condition       Past Medical History-  Patient Active Problem List   Diagnosis Date Noted  . Chronic respiratory failure with hypoxia (HCC) 06/15/2017    Priority: High  . Diastolic CHF ([place]) 25/95/6387    Priority: High  . History of cardioembolic stroke 56/43/3295    Priority: High  . Spinal stenosis, lumbar region, with neurogenic claudication 05/09/2014    Priority: High  . History of Focal seizure ([place]) 06/16/2013    Priority: High  . TIA (transient ischemic attack) 05/31/2013    Priority: High  . History of Large B-cell lymphoma ([place]) 02/18/2012    Priority: High  . Atrial fibrillation ([place]) 12/14/2008    Priority: High  . Agitation 03/11/2018    Priority: Medium  . Bacteremia due to Streptococcus 11/21/2015    Priority: Medium  . Cellulitis of leg, right 11/20/2015    Priority: Medium  . Diverticulitis of colon 09/26/2014    Priority: Medium  . CKD (chronic kidney disease), stage III ([place]) 09/26/2014    Priority: Medium  . Laryngeal carcinoma ([place])     Priority: Medium  . Anxiety state 10/20/2008    Priority: Medium  . Depression 10/20/2008    Priority: Medium  . Dyspnea on exertion 07/06/2008    Priority: Medium  . COPD GOLD II 03/26/2008    Priority: Medium  . Hyperlipidemia 03/18/2007    Priority: Medium  . Essential hypertension 03/18/2007    Priority: Medium  . Chronic  diarrhea 09/04/2017    Priority: Low  . Aortic atherosclerosis ([place]) 06/30/2016    Priority: Low  . Weakness generalized 06/30/2013    Priority: Low  . Bilateral hand pain 01/09/2013    Priority: Low  . Chest pain 04/25/2018  . GERD (gastroesophageal reflux disease) 05/03/2007    Medications- reviewed and updated Current Outpatient Medications  Medication Sig Dispense Refill  . atorvastatin (LIPITOR) 20 MG tablet TAKE 1 TABLET BY MOUTH ONCE DAILY (Patient taking differently: Take 20 mg by mouth daily at 12 noon. ) 30 tablet 5  . bisoprolol (ZEBETA) 5 MG tablet Take 1 tablet (5 mg total) by mouth daily at 12 noon. 30 tablet 0  . busPIRone ([name]) 5 MG tablet Take 1 tablet (5 mg total) by mouth 2 (two) times daily. (Patient taking differently: Take 5 mg by mouth daily. ) 60 tablet 5  . cholecalciferol (VITAMIN D) 1000 UNITS tablet Take 1,000 Units by mouth daily.    . Cyanocobalamin (VITAMIN B-12 PO) Take 1 tablet by mouth daily.    Marland Kitchen gabapentin (NEURONTIN) 100 MG capsule TAKE ONE CAPSULE BY MOUTH 3 TIMES A DAY (Patient taking differently: Take 100 mg by mouth 3 (three) times daily. ) 270 capsule 1  . ketoconazole (NIZORAL) 2 % cream Apply 1 application topically daily. 15 g 0  . levETIRAcetam (KEPPRA) 1000 MG tablet Take 1 tablet (1,000 mg total) by mouth 2 (two)  times daily. 180 tablet 1  . loperamide (IMODIUM) 2 MG capsule Take 2 mg by mouth daily as needed for diarrhea or loose stools.     Marland Kitchen omeprazole (PRILOSEC) 20 MG capsule Take 20 mg by mouth daily.    . OXYGEN 2lpm with sleep and occ during the day    . XARELTO 15 MG TABS tablet TAKE 1 TABLET BY MOUTH ONCE DAILY WITH SUPPER (Patient taking differently: Take 15 mg by mouth daily with supper. ) 30 tablet 11   No current facility-administered medications for this visit.      Objective:  BP 120/80 (BP Location: Left Arm, Patient Position: Sitting, Cuff Size: Large)   Temp 97.7 F (36.5 C) (Oral)   Ht 5\' 4"  (1.626 m)   Wt 187  lb 9.6 oz (85.1 kg)   BMI 32.20 kg/m  Gen: NAD, resting comfortably CV: regular rate-  no murmurs rubs or gallops Lungs: CTAB no crackles, wheeze, rhonchi Abdomen: soft/nontender/nondistended/normal bowel sounds. obese  Ext: no edema Skin: warm, dry Neuro: walks with walker, tangential    Assessment and Plan    #Worried well S: patient feels like she has seen worms in her stool. Husband is not sure what he is looking at but has not clearly seem worms. She is still dealing with alternating constipation and diarrhea A/P: Patient brings in tissue with some stool and no evidence of worm in what she is pointing at- looks like stool. Brings a plastic bag with some more liquid stool and no evidence there either - offered to get stool sample to make sure no ova/parasites but she declines  Depression S: reports Depression- cant go anywhere, cant do anything. Hurts too much.  A/P: elevated phq9 today over 5 but under 10. Discussed adding zoloft 25mg  or counseling- she declines both for now bu tagrees to call if changes mind or if she has worsening symptoms. Could continue buspirone if start that- has noted some mild agitation benefit with that    Future Appointments  Date Time Provider [place]  01/02/2019  1:30 PM Pieter Partridge, DO [facility] None  01/12/2019 10:45 AM Owens Shark, NP [facility] None    Time Stamp The duration of face-to-face time during this visit was greater than 15 minutes. Greater than 50% of this time was spent in counseling, explanation of diagnosis, planning of further management, and/or coordination of care including discussion of concerns about stool and depressed mood as well as agitation, going through options for monitoring each. .    Return precautions advised.  [name], MD

## 2018-09-21 ENCOUNTER — Other Ambulatory Visit: Payer: Self-pay | Admitting: Family Medicine

## 2018-09-21 NOTE — Telephone Encounter (Signed)
Noted! Message routed to PCP for to fill.

## 2018-09-21 NOTE — Telephone Encounter (Signed)
[name] came in on 09/21/18 with [name] and he informed me that [name] has decided to try to the zoloft 25mg  medication that was offered at her previous visit. Could we please place this prescription for her. Thanks!

## 2018-09-22 MED ORDER — SERTRALINE HCL 25 MG PO TABS: 25.0000 mg | ORAL_TABLET | Freq: Every day | ORAL | 5 refills | Status: DC

## 2018-09-22 NOTE — Telephone Encounter (Signed)
Pt called to check status or Rx

## 2018-09-22 NOTE — Telephone Encounter (Signed)
See note

## 2018-09-22 NOTE — Addendum Note (Signed)
Addended by: Darral Dash on: 09/22/2018 01:59 PM   Modules accepted: Orders

## 2018-09-22 NOTE — Telephone Encounter (Signed)
Dr. Yong Channel, pt  would like to try Zoloft.  Please send Rx.

## 2018-09-22 NOTE — Telephone Encounter (Signed)
Send to [place], [address] [phone] (Phone) [phone] (Fax)

## 2018-09-22 NOTE — Telephone Encounter (Signed)
Pt requesting to have Zoloft ordered for her. Pt was offered to try medication at [place] 09/20/18 and today is calling back for RX to be called in.

## 2018-09-22 NOTE — Telephone Encounter (Signed)
We sent this in. Make sure to review the following with patient. I would recommend a 6 week check in to see how she is doing with it   Taking the medicine as directed and not missing any doses is one of the best things you can do to treat your depression.  Here are some things to keep in mind:  1) Side effects (stomach upset, some increased anxiety) may happen before you notice a benefit.  These side effects typically go away over time. 2) Changes to your dose of medicine or a change in medication all together is sometimes necessary 3) Most people need to be on medication at least 6-12 months 4) Many people will notice an improvement within two weeks but the full effect of the medication can take up to 4-6 weeks 5) Stopping the medication when you start feeling better often results in a return of symptoms 6) If you start having thoughts of hurting yourself or others after starting this medicine, call our office immediately at [phone] or seek care through 911.

## 2018-09-23 ENCOUNTER — Ambulatory Visit: Payer: Medicare Other | Admitting: Neurology

## 2018-09-23 NOTE — Telephone Encounter (Signed)
Spoke to patient and reviewed the medication. Pt was informed per Dr. Yong Channel  "Taking the medicine as directed and not missing any doses is one of the best things you can do to treat your depression.  Here are some things to keep in mind:  1. Side effects (stomach upset, some increased anxiety) may happen before you notice a benefit.  These side effects typically go away over time. 2. Changes to your dose of medicine or a change in medication all together is sometimes necessary 3. Most people need to be on medication at least 6-12 months 4. Many people will notice an improvement within two weeks but the full effect of the medication can take up to 4-6 weeks 5. Stopping the medication when you start feeling better often results in a return of symptoms 6. If you start having thoughts of hurting yourself or others after starting this medicine, call our office immediately at [phone] or seek care through 911. "  Pt verbalized understanding!

## 2018-09-28 ENCOUNTER — Ambulatory Visit: Payer: Medicare Other | Admitting: Family Medicine

## 2018-09-28 ENCOUNTER — Encounter: Payer: Self-pay | Admitting: Family Medicine

## 2018-09-28 VITALS — BP 122/80 | HR 89 | Temp 98.1°F | Ht 64.0 in | Wt 188.2 lb

## 2018-09-28 DIAGNOSIS — H6123 Impacted cerumen, bilateral: Secondary | ICD-10-CM | POA: Diagnosis not present

## 2018-09-28 NOTE — Patient Instructions (Signed)
Debrox over the counter  Earwax Buildup, Adult The ears produce a substance called earwax that helps keep bacteria out of the ear and protects the skin in the ear canal. Occasionally, earwax can build up in the ear and cause discomfort or hearing loss. What increases the risk? This condition is more likely to develop in people who:  Are female.  Are elderly.  Naturally produce more earwax.  Clean their ears often with cotton swabs.  Use earplugs often.  Use in-ear headphones often.  Wear hearing aids.  Have narrow ear canals.  Have earwax that is overly thick or sticky.  Have eczema.  Are dehydrated.  Have excess hair in the ear canal. What are the signs or symptoms? Symptoms of this condition include:  Reduced or muffled hearing.  A feeling of fullness in the ear or feeling that the ear is plugged.  Fluid coming from the ear.  Ear pain.  Ear itch.  Ringing in the ear.  Coughing.  An obvious piece of earwax that can be seen inside the ear canal. How is this diagnosed? This condition may be diagnosed based on:  Your symptoms.  Your medical history.  An ear exam. During the exam, your health care provider will look into your ear with an instrument called an otoscope. You may have tests, including a hearing test. How is this treated? This condition may be treated by:  Using ear drops to soften the earwax.  Having the earwax removed by a health care provider. The health care provider may: ? Flush the ear with water. ? Use an instrument that has a loop on the end (curette). ? Use a suction device.  Surgery to remove the wax buildup. This may be done in severe cases. Follow these instructions at home:   Take over-the-counter and prescription medicines only as told by your health care provider.  Do not put any objects, including cotton swabs, into your ear. You can clean the opening of your ear canal with a washcloth or facial tissue.  Follow  instructions from your health care provider about cleaning your ears. Do not over-clean your ears.  Drink enough fluid to keep your urine clear or pale yellow. This will help to thin the earwax.  Keep all follow-up visits as told by your health care provider. If earwax builds up in your ears often or if you use hearing aids, consider seeing your health care provider for routine, preventive ear cleanings. Ask your health care provider how often you should schedule your cleanings.  If you have hearing aids, clean them according to instructions from the manufacturer and your health care provider. Contact a health care provider if:  You have ear pain.  You develop a fever.  You have blood, pus, or other fluid coming from your ear.  You have hearing loss.  You have ringing in your ears that does not go away.  Your symptoms do not improve with treatment.  You feel like the room is spinning (vertigo). Summary  Earwax can build up in the ear and cause discomfort or hearing loss.  The most common symptoms of this condition include reduced or muffled hearing and a feeling of fullness in the ear or feeling that the ear is plugged.  This condition may be diagnosed based on your symptoms, your medical history, and an ear exam.  This condition may be treated by using ear drops to soften the earwax or by having the earwax removed by a health care provider.  Do not put any objects, including cotton swabs, into your ear. You can clean the opening of your ear canal with a washcloth or facial tissue. This information is not intended to replace advice given to you by your health care provider. Make sure you discuss any questions you have with your health care provider. Document Released: 08/27/2004 Document Revised: 07/01/2017 Document Reviewed: 09/30/2016 Elsevier Interactive Patient Education  2019 [employer].

## 2018-09-28 NOTE — Progress Notes (Signed)
Patient: [name] MRN: [id number] DOB: 1928/07/13 PCP: Marin Olp, MD     Subjective:  Chief Complaint  Patient presents with  . decreased hearing    HPI: The patient is a 85 y.o. female who presents today for possible impacted cerumen with decreased hearing. She has decreased hearing for years she tells me. She has no pain or drainage. They think it's just wax and need cleaned out. She has tried otc drops the help with the cerumen and it doesn't work. She does not wear hearing aides. She doesn't know name of otc drops.   Review of Systems  HENT: Negative for ear pain.   Neurological: Negative for dizziness and headaches.    Allergies Patient is allergic to ativan [lorazepam]; propoxyphene hcl; codeine; penicillins; and vancomycin.  Past Medical History Patient  has a past medical history of ABDOMINAL INCISIONAL HERNIA (04/03/2010), Anxiety, Arthritis, Blood transfusion, COPD (chronic obstructive pulmonary disease) ([place]), Coronary artery disease, Depression, Diverticulitis of colon with hemorrhage (07/24/2008), DVT (deep venous thrombosis) ([place]) (1954), Exertional shortness of breath, Family history of anesthesia complication, GERD (9/50/9326), GERD (gastroesophageal reflux disease), H/O hiatal hernia, HCAP (healthcare-associated pneumonia) (06/26/2013), Heart murmur, History of TIAs, Hyperlipidemia, Hypertension, 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  Surgical History Patient  has a past surgical history that includes Diverting ileostomy (09/22/2008); Tubal ligation (1972); Radical neck dissection (1986); Total knee arthroplasty (Bilateral); Ileostomy closure  (01/2010); Cataract extraction (2005); Carotid endarterectomy (Bilateral, 2004); Tonsillectomy and adenoidectomy (1936); Hernia repair (05/2010); Carpal tunnel release (Left, ?1960's); Abdominal exploration surgery (12/01/2012); Appendectomy (09/13/2008); Colon surgery (09/13/2008); and Dilation and curettage of uterus (1950's).  Family History Pateint's family history includes Cancer in her father; Colon cancer in her sister; Hyperlipidemia in her mother; Stroke in her mother.  Social History Patient  reports that she quit smoking about 34 years ago. Her smoking use included cigarettes. She has a 92.50 pack-year smoking history. She has never used smokeless tobacco. She reports that she does not drink alcohol or use drugs.    Objective: Vitals:   09/28/18 1117  BP: 122/80  Pulse: 89  Temp: 98.1 F (36.7 C)  TempSrc: Oral  SpO2: 93%  Weight: 188 lb 3.2 oz (85.4 kg)  Height: 5\' 4"  (1.626 m)    Body mass index is 32.3 kg/m.  Physical Exam Vitals signs reviewed.  Constitutional:      Appearance: She is obese.  HENT:     Right Ear: There is impacted cerumen.     Left Ear: There is impacted cerumen.  Cardiovascular:     Rate and Rhythm: Normal rate and regular rhythm.     Heart sounds: Normal heart sounds.  Pulmonary:     Effort: Pulmonary effort is normal.     Breath sounds: Normal breath sounds.  Abdominal:     General: Abdomen is flat. Bowel sounds are normal.     Palpations: Abdomen is soft.  Neurological:     General: No focal deficit present.     Mental Status: She is alert.   verbal consent obtained.  TM irrigated and lavaged by CMA. Right TM pearly and wnl after this was done. Left TM with cerumen still obscuring the TM.     Assessment/plan: 1. Impacted cerumen of both ears Irrigated and lavaged. Start debrox  in the left ear to help with rest of wax. Tolerated well and overall worked well. F/u as needed.     Return if symptoms worsen or fail to improve.   Orma Flaming, MD [place]   09/28/2018

## 2018-10-11 ENCOUNTER — Other Ambulatory Visit: Payer: Self-pay

## 2018-10-11 MED ORDER — RIVAROXABAN 15 MG PO TABS: ORAL_TABLET | ORAL | 11 refills | Status: DC

## 2018-10-12 ENCOUNTER — Other Ambulatory Visit: Payer: Self-pay | Admitting: Family Medicine

## 2018-10-23 ENCOUNTER — Other Ambulatory Visit: Payer: Self-pay | Admitting: Neurology

## 2018-10-25 ENCOUNTER — Other Ambulatory Visit: Payer: Self-pay | Admitting: Family Medicine

## 2018-10-25 MED ORDER — KETOCONAZOLE 2 % EX CREA: 1.0000 "application " | TOPICAL_CREAM | Freq: Every day | CUTANEOUS | 0 refills | Status: DC

## 2018-10-25 NOTE — Telephone Encounter (Signed)
See note

## 2018-10-26 ENCOUNTER — Other Ambulatory Visit: Payer: Self-pay

## 2018-10-26 MED ORDER — ATORVASTATIN CALCIUM 20 MG PO TABS: 20.0000 mg | ORAL_TABLET | Freq: Every day | ORAL | 1 refills | Status: DC

## 2018-10-26 MED ORDER — BUSPIRONE HCL 5 MG PO TABS: 5.0000 mg | ORAL_TABLET | Freq: Two times a day (BID) | ORAL | 5 refills | Status: DC

## 2018-10-26 MED ORDER — BISOPROLOL FUMARATE 5 MG PO TABS: 5.0000 mg | ORAL_TABLET | Freq: Every day | ORAL | 1 refills | Status: DC

## 2018-11-14 ENCOUNTER — Other Ambulatory Visit: Payer: Self-pay | Admitting: Family Medicine

## 2018-11-14 MED ORDER — BISOPROLOL FUMARATE 5 MG PO TABS: 5.0000 mg | ORAL_TABLET | Freq: Every day | ORAL | 1 refills | Status: DC

## 2018-11-14 NOTE — Telephone Encounter (Signed)
Patient called and spoke to husband about her pharmacy, he says all medications should go to [facility]. I verified this is the only pharmacy on her profile.

## 2018-11-23 ENCOUNTER — Other Ambulatory Visit: Payer: Self-pay | Admitting: Family Medicine

## 2018-11-23 NOTE — Telephone Encounter (Signed)
[place] for 90 day Rx?

## 2018-12-20 ENCOUNTER — Other Ambulatory Visit: Payer: Self-pay | Admitting: Neurology

## 2019-01-02 ENCOUNTER — Ambulatory Visit: Payer: Medicare Other | Admitting: Neurology

## 2019-01-12 ENCOUNTER — Ambulatory Visit: Payer: Medicare Other | Admitting: Nurse Practitioner

## 2019-02-01 ENCOUNTER — Ambulatory Visit: Payer: Self-pay | Admitting: *Deleted

## 2019-02-01 NOTE — Telephone Encounter (Signed)
Summary: lightheaded, sore head, back pain   Lightheaded, sore head, back pain      Pt's daughter is stating that her mom is c/o having a sore spot on the back of her head and her lower back. This started on Sunday. She did start using an exercise chair were she can do sit ups by herself.  She has not fallen or hit her head. Her daughter stated she has a bruise there but she bruises easily. She also c/o of some wooziness no energy. Denies fatigue or fever.  She has taken one Tylenol in the last 4 days. Advised to try a warm compress to her back to see if that helps the pain. She is requesting to have an office visit and not a virtual one. Routing to LB at [facility] for review and recommendation.  Reason for Disposition . [1] MODERATE back pain (e.g., interferes with normal activities) AND [2] present > 3 days  Answer Assessment - Initial Assessment Questions 1. ONSET: "When did the pain begin?"      Sunday 2. LOCATION: "Where does it hurt?" (upper, mid or lower back)     Lower back 3. SEVERITY: "How bad is the pain?"  (e.g., Scale 1-10; mild, moderate, or severe)   - MILD (1-3): doesn't interfere with normal activities    - MODERATE (4-7): interferes with normal activities or awakens from sleep    - SEVERE (8-10): excruciating pain, unable to do any normal activities      Pain # 5-6 4. PATTERN: "Is the pain constant?" (e.g., yes, no; constant, intermittent)      intermittent  5. RADIATION: "Does the pain shoot into your legs or elsewhere?"     no 6. CAUSE:  "What do you think is causing the back pain?"      Not sure 7. BACK OVERUSE:  "Any recent lifting of heavy objects, strenuous work or exercise?"    A new exercise chair were she can do sit ups 8. MEDICATIONS: "What have you taken so far for the pain?" (e.g., nothing, acetaminophen, NSAIDS)     Tylenol once  9. NEUROLOGIC SYMPTOMS: "Do you have any weakness, numbness, or problems with bowel/bladder control?"     no 10.  OTHER SYMPTOMS: "Do you have any other symptoms?" (e.g., fever, abdominal pain, burning with urination, blood in urine)      Burning with urination 11. PREGNANCY: "Is there any chance you are pregnant?" (e.g., yes, no; LMP)       no  Protocols used: BACK PAIN-A-AH

## 2019-02-02 ENCOUNTER — Ambulatory Visit (INDEPENDENT_AMBULATORY_CARE_PROVIDER_SITE_OTHER): Payer: Medicare Other | Admitting: Family Medicine

## 2019-02-02 ENCOUNTER — Encounter: Payer: Self-pay | Admitting: Family Medicine

## 2019-02-02 VITALS — BP 130/70 | HR 78 | Temp 98.2°F | Ht 64.0 in | Wt 181.0 lb

## 2019-02-02 DIAGNOSIS — R203 Hyperesthesia: Secondary | ICD-10-CM | POA: Diagnosis not present

## 2019-02-02 DIAGNOSIS — E860 Dehydration: Secondary | ICD-10-CM

## 2019-02-02 NOTE — Telephone Encounter (Signed)
Called pt and advised. Scheduled telephone visit for today at 4:40. Will keep scheduled appointment for Monday in office, will call back to cancel if she she starts to feel better.

## 2019-02-02 NOTE — Telephone Encounter (Signed)
Schedule appt with Dr. Yong Channel for this coming Monday. Daughter has concerns about her having to go through the holiday weekend with pain. She reports that her mother has not fallen and has no urinary symptoms. She c/o LBP, lightheadedness, and a sore stop of the back of her head. I advised that her mom may need to be sooner , at urgent care or ED, over the weekend if she starts to develop symptoms of a UTI. Daughter would like to know if there is anything that can be done to help her mom with the pain she is having or if Dr. Yong Channel thinks an antibiotic should be prescribed in case she is starting to develop of a UTI.   Forwarding to Dr. Yong Channel to advise.

## 2019-02-02 NOTE — Telephone Encounter (Signed)
-   please try ice pack on the head 10-20 minutes 3 times a day -Schedule Tylenol 650 mg every 6-8 hours - check vital signs (really want blood pressure, temperature, pulse if possible)  if possible at home- most ideally would do virtual or telephone visit today so we can at least chat this over - would have preferred for her to be scheduled yesterday evening for a visit when she called in as we had openings at that time for today - agree if worsening symptoms seek care over weekend - we have had people with fatigue with covid 19 positive so would prefer to talk to her today and possibly get her set up for covid 19 testing which would give [place] more peace of mind for bringing her in building on Monday. When she is brought in Monday- bring her immediately to the room and lets wear face shields.

## 2019-02-02 NOTE — Progress Notes (Signed)
Phone [phone]   Subjective:  Virtual visit via phonenote Chief Complaint  Patient presents with  . Other    Sensitive skin   This visit type was conducted due to national recommendations for restrictions regarding the COVID-19 Pandemic (e.g. social distancing).  This format is felt to be most appropriate for this patient at this time balancing risks to patient and risks to population by having him in for in person visit.  All issues noted in this document were discussed and addressed.  No physical exam was performed (except for noted visual exam or audio findings with Telehealth visits).  The patient has consented to conduct a Telehealth visit and understands insurance will be billed.   Our team/I connected with Lynnae Prude at  4:40 PM EDT by phone (patient did not have equipment for webex) and verified that I am speaking with the correct person using two identifiers.  Location patient: Home[place] Location provider: [facility], office Persons participating in the virtual visit:  patient  Time on phone: 12 minutes Counseling provided about precautions for emergency room over the weekend if needed, counseling about treatment for skin irritation  Our team/I discussed the limitations of evaluation and management by telemedicine and the availability of in person appointments. In light of current covid-19 pandemic, patient also understands that we are trying to protect them by minimizing in office contact if at all possible.  The patient expressed consent for telemedicine visit and agreed to proceed. Patient understands insurance will be billed.   ROS-no fever/chills/nausea/vomiting.  Does not describe any deep headaches but describes sensitivity over scalp and area where she feels a small bump but others have not noticed.  Past Medical History-  Patient Active Problem List   Diagnosis Date Noted  . Chronic respiratory failure with hypoxia (HCC) 06/15/2017    Priority: High  .  Diastolic CHF ([place]) 10/62/6948    Priority: High  . History of cardioembolic stroke 54/62/7035    Priority: High  . Spinal stenosis, lumbar region, with neurogenic claudication 05/09/2014    Priority: High  . History of Focal seizure ([place]) 06/16/2013    Priority: High  . TIA (transient ischemic attack) 05/31/2013    Priority: High  . History of Large B-cell lymphoma ([place]) 02/18/2012    Priority: High  . Atrial fibrillation ([place]) 12/14/2008    Priority: High  . Agitation 03/11/2018    Priority: Medium  . Bacteremia due to Streptococcus 11/21/2015    Priority: Medium  . Cellulitis of leg, right 11/20/2015    Priority: Medium  . Diverticulitis of colon 09/26/2014    Priority: Medium  . CKD (chronic kidney disease), stage III ([place]) 09/26/2014    Priority: Medium  . Laryngeal carcinoma ([place])     Priority: Medium  . Anxiety state 10/20/2008    Priority: Medium  . Depression 10/20/2008    Priority: Medium  . Dyspnea on exertion 07/06/2008    Priority: Medium  . COPD GOLD II 03/26/2008    Priority: Medium  . Hyperlipidemia 03/18/2007    Priority: Medium  . Essential hypertension 03/18/2007    Priority: Medium  . Chronic diarrhea 09/04/2017    Priority: Low  . Aortic atherosclerosis ([place]) 06/30/2016    Priority: Low  . Weakness generalized 06/30/2013    Priority: Low  . Bilateral hand pain 01/09/2013    Priority: Low  . Chest pain 04/25/2018  . GERD (gastroesophageal reflux disease) 05/03/2007    Medications- reviewed and updated Current Outpatient Medications  Medication  Sig Dispense Refill  . atorvastatin (LIPITOR) 20 MG tablet Take 1 tablet (20 mg total) by mouth daily. 90 tablet 1  . bisoprolol (ZEBETA) 5 MG tablet Take 1 tablet (5 mg total) by mouth daily. 90 tablet 1  . busPIRone ([name]) 5 MG tablet TAKE 1 TABLET BY MOUTH TWICE A DAY 180 tablet 3  . cholecalciferol (VITAMIN D) 1000 UNITS tablet Take 1,000 Units by mouth daily.    . Cyanocobalamin (VITAMIN B-12  PO) Take 1 tablet by mouth daily.    Marland Kitchen gabapentin (NEURONTIN) 100 MG capsule TAKE ONE CAPSULE BY MOUTH 3 TIMES A DAY 270 capsule 1  . ketoconazole (NIZORAL) 2 % cream Apply 1 application topically daily. 15 g 0  . levETIRAcetam (KEPPRA) 1000 MG tablet TAKE 1 TABLET BY MOUTH TWICE A DAY 180 tablet 1  . loperamide (IMODIUM) 2 MG capsule Take 2 mg by mouth daily as needed for diarrhea or loose stools.     Marland Kitchen omeprazole (PRILOSEC) 20 MG capsule Take 20 mg by mouth daily.    . Rivaroxaban (XARELTO) 15 MG TABS tablet TAKE 1 TABLET BY MOUTH ONCE DAILY WITH SUPPER 30 tablet 11  . sertraline (ZOLOFT) 25 MG tablet Take 1 tablet (25 mg total) by mouth daily. 30 tablet 5   No current facility-administered medications for this visit.      Objective:  BP 130/70   Pulse 78   Temp 98.2 F (36.8 C)   Ht 5\' 4"  (1.626 m)   Wt 181 lb (82.1 kg)   LMP  (LMP Unknown)   BMI 31.07 kg/m  self reported vitals  Nonlabored voice, normal speech      Assessment and Plan   #Sensitive skin on back of scalp S:Patient fell and hit the back of her head and went right through the wall in the winter. Has not had ongoing headaches- but due to that prior fall she is concerned about the below symptoms  On Sunday started an exercise machine that she can pull herself up and do sit ups on- apparently did 50 of those. Went to lay down after her workout- and when laid down on her pillow felt like scalp was more tender in a particular spot. Does not have a headache but scalp tenderness. Family does not see any redness or rash in that area. Very tender when she touches one spot. She feels liek she has a small bump but family doesn't see that  Had some dizziness onsunday as well but that seems to be getting better. Has not tried heat or ice. Has only taken 1 tylenol- that helped.  Admits to not doing a good job with hydration lately and it has been hotter.   Denies more confusion than usual. Back pain is not worse than usual.  A/P:  Patient concerned about sensitive skin on back of scalp- sounds like could be an irritated or early infection of hair follicles. We also discussed possibility of shingles- she will watch for rash development.  For discomfort -tylenol 650 every 6 hours -heat apply 20 mins 3x a day - we have an appointment on Monday that she can cancel if improved - if develops headache or worsening symptoms- advised to proceed to evaluation such as urgent care or ED over the weekend  For dehydration/dizziness-she agrees to push fluids   Future Appointments  Date Time Provider [place]  02/06/2019  4:20 PM Marin Olp, MD [facility]   Lab/Order associations:   ICD-10-CM   1.  Sensitive skin  R20.3   2. Dehydration  E86.0    Return precautions advised.  [name], MD

## 2019-02-02 NOTE — Patient Instructions (Signed)
Health Maintenance Due  Topic Date Due  . DEXA SCAN  03/11/1995    Depression screen [facility] 2/9 09/20/2018 09/30/2017 09/30/2017  Decreased Interest 2 (No Data) 0  Down, Depressed, Hopeless 2 - 0  PHQ - 2 Score 4 - 0  Altered sleeping 1 - -  Tired, decreased energy 3 - -  Change in appetite 3 - -  Feeling bad or failure about yourself  2 - -  Trouble concentrating 1 - -  Moving slowly or fidgety/restless 1 - -  Suicidal thoughts 0 - -  PHQ-9 Score 15 - -  Difficult doing work/chores Very difficult - -  Some recent data might be hidden

## 2019-02-03 ENCOUNTER — Encounter: Payer: Self-pay | Admitting: Family Medicine

## 2019-02-06 ENCOUNTER — Ambulatory Visit: Payer: Medicare Other | Admitting: Family Medicine

## 2019-03-14 ENCOUNTER — Other Ambulatory Visit: Payer: Self-pay | Admitting: Family Medicine

## 2019-03-15 ENCOUNTER — Other Ambulatory Visit: Payer: Self-pay | Admitting: Family Medicine

## 2019-03-24 ENCOUNTER — Other Ambulatory Visit: Payer: Self-pay

## 2019-03-24 ENCOUNTER — Ambulatory Visit (INDEPENDENT_AMBULATORY_CARE_PROVIDER_SITE_OTHER): Payer: Medicare Other

## 2019-03-24 DIAGNOSIS — Z0000 Encounter for general adult medical examination without abnormal findings: Secondary | ICD-10-CM | POA: Diagnosis not present

## 2019-03-24 NOTE — Progress Notes (Signed)
This visit is being conducted via phone call due to the COVID-19 pandemic. This patient has given me verbal consent via phone to conduct this visit, patient states they are participating from their home address. Some vital signs may be absent or patient reported.   Patient identification: identified by name, DOB, and current address.   Subjective:   [name] is a 85 y.o. female who presents for Medicare Annual (Subsequent) preventive examination.  Review of Systems:   Cardiac Risk Factors include: advanced age (>62men, >53 women);dyslipidemia;hypertension     Objective:     Vitals: LMP  (LMP Unknown)   There is no height or weight on file to calculate BMI.  Advanced Directives 03/24/2019 04/25/2018 09/30/2017 05/01/2017 05/01/2017 04/30/2017 04/09/2017  Does Patient Have a Medical Advance Directive? No No No Yes Yes Yes No  Type of Advance Directive - - - - Press photographer;Living will [place];Living will -  Does patient want to make changes to medical advance directive? - - - No - Patient declined - - -  Copy of [place] in Chart? - - - No - copy requested No - copy requested No - copy requested -  Would patient like information on creating a medical advance directive? No - Patient declined No - Patient declined - - No - Patient declined No - Patient declined -  Pre-existing out of facility DNR order (yellow form or pink MOST form) - - - - - - -    Tobacco Social History   Tobacco Use  Smoking Status Former Smoker  . Packs/day: 2.50  . Years: 37.00  . Pack years: 92.50  . Types: Cigarettes  . Quit date: 09/05/1984  . Years since quitting: 34.5  Smokeless Tobacco Never Used  Tobacco Comment   quit when she was dx with cancer      Clinical Intake:  Pre-visit preparation completed: Yes  How often do you need to have someone help you when you read instructions, pamphlets, or other written materials from your doctor  or pharmacy?: 2 - Rarely  Interpreter Needed?: No  Information entered by :: Denman George LPN  Past Medical History:  Diagnosis Date  . ABDOMINAL INCISIONAL HERNIA 04/03/2010      . Anxiety   . Arthritis    "lots; hands" (05/26/2013)  . Blood transfusion    "w/1st miscarriage" (05/26/2013)  . COPD (chronic obstructive pulmonary disease) ([place])   . Coronary artery disease   . Depression   . Diverticulitis of colon with hemorrhage 07/24/2008  . DVT (deep venous thrombosis) ([name]) 1954   "after I had my first child" (05/26/2013)  . Exertional shortness of breath   . Family history of anesthesia complication    "sisters also get sick as a dog" (05/26/2013)  . GERD 05/03/2007      . GERD (gastroesophageal reflux disease)   . H/O hiatal hernia   . HCAP (healthcare-associated pneumonia) 06/26/2013  . Heart murmur    "when I was a child" (05/26/2013)  . History of TIAs   . Hyperlipidemia   . Hypertension   . 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 Lymphona, mantle cell, inguinal region/lower limb ([place])    "large c-cell" (05/26/2013)  . OSTEOARTHRITIS 05/03/2007  . PEPTIC ULCER DISEASE 07/06/2008  . Pneumonia 2013   "once" (05/26/2013)  . PUD (peptic ulcer disease)   . PVD (peripheral vascular disease) ([place])   .  Seizures ([place])   . Stroke ([place]) 05/26/2013   "very mild; affected my speech for a while" (05/26/2013)  . VARICOSE VEINS LOWER EXTREMITIES W/INFLAMMATION 01/15/2009   Past Surgical History:  Procedure Laterality Date  . ABDOMINAL EXPLORATION SURGERY  12/01/2012  . APPENDECTOMY  09/13/2008  . CAROTID ENDARTERECTOMY Bilateral 2004   2004 a month apart right and left  . CARPAL TUNNEL RELEASE Left ?1960's  . CATARACT EXTRACTION  2005   bilateral  . COLON SURGERY  09/13/2008   Laparoscopic assisted converted to open sigmoid colectomy.Archie Endo 09/13/2008 (05/26/2013)  . DILATION AND CURETTAGE OF UTERUS  1950's   "had 2 for  miscarriages" (05/26/2013)  . DIVERTING ILEOSTOMY  09/22/2008   iliostomy for diverticulitis/notes 09/22/2008 (05/26/2013)  . HERNIA REPAIR  05/2010   ventral hernia repair  . ILEOSTOMY CLOSURE  01/2010  . RADICAL NECK DISSECTION  1986   "larynx cancer" (05/26/2013)  . TONSILLECTOMY AND ADENOIDECTOMY  1936   "tonsils grew back; no 2nd OR" (05/26/2013)  . TOTAL KNEE ARTHROPLASTY Bilateral    2002 left 2008 right  . TUBAL LIGATION  1972   Family History  Problem Relation Age of Onset  . Colon cancer Sister        colon  . Hyperlipidemia Mother   . Stroke Mother   . Cancer Father        mesothelioma   Social History   Socioeconomic History  . Marital status: Married    Spouse name: Not on file  . Number of children: 6  . Years of education: Not on file  . Highest education level: Not on file  Occupational History  . Occupation: retired    Fish farm manager: RETIRED  Social Needs  . Financial resource strain: Patient refused  . Food insecurity    Worry: Patient refused    Inability: Patient refused  . Transportation needs    Medical: Patient refused    Non-medical: Patient refused  Tobacco Use  . Smoking status: Former Smoker    Packs/day: 2.50    Years: 37.00    Pack years: 92.50    Types: Cigarettes    Quit date: 09/05/1984    Years since quitting: 34.5  . Smokeless tobacco: Never Used  . Tobacco comment: quit when she was dx with cancer  Substance and Sexual Activity  . Alcohol use: No    Alcohol/week: 0.0 standard drinks  . Drug use: No  . Sexual activity: Not Currently  Lifestyle  . Physical activity    Days per week: Patient refused    Minutes per session: Patient refused  . Stress: Patient refused  Relationships  . Social Herbalist on phone: Patient refused    Gets together: Patient refused    Attends religious service: Patient refused    Active member of club or organization: Patient refused    Attends meetings of clubs or organizations: Patient  refused    Relationship status: Patient refused  Other Topics Concern  . Not on file  Social History Narrative   Married (husband patient of Dr. Yong Channel). 6 children (lost 2 for total 8). 15 grandkids. 6 greatgrandchildren.    Lives with husband. Husband drives. Patient does not drive.       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     Outpatient Encounter Medications as of 03/24/2019  Medication Sig  . atorvastatin (LIPITOR) 20 MG tablet Take 1 tablet (  20 mg total) by mouth daily.  . bisoprolol (ZEBETA) 5 MG tablet TAKE 1 TABLET BY MOUTH EVERY DAY  . busPIRone ([name]) 5 MG tablet TAKE 1 TABLET BY MOUTH TWICE A DAY  . cholecalciferol (VITAMIN D) 1000 UNITS tablet Take 1,000 Units by mouth daily.  . Cyanocobalamin (VITAMIN B-12 PO) Take 1 tablet by mouth daily.  Marland Kitchen gabapentin (NEURONTIN) 100 MG capsule TAKE ONE CAPSULE BY MOUTH 3 TIMES A DAY  . ketoconazole (NIZORAL) 2 % cream Apply 1 application topically daily.  Marland Kitchen levETIRAcetam (KEPPRA) 1000 MG tablet TAKE 1 TABLET BY MOUTH TWICE A DAY  . loperamide (IMODIUM) 2 MG capsule Take 2 mg by mouth daily as needed for diarrhea or loose stools.   Marland Kitchen omeprazole (PRILOSEC) 20 MG capsule Take 20 mg by mouth daily.  . Rivaroxaban (XARELTO) 15 MG TABS tablet TAKE 1 TABLET BY MOUTH ONCE DAILY WITH SUPPER  . sertraline (ZOLOFT) 25 MG tablet TAKE 1 TABLET BY MOUTH EVERY DAY   No facility-administered encounter medications on file as of 03/24/2019.     Activities of Daily Living In your present state of health, do you have any difficulty performing the following activities: 03/24/2019 04/25/2018  Hearing? N N  Vision? N N  Difficulty concentrating or making decisions? N N  Walking or climbing stairs? Y Y  Dressing or bathing? N N  Doing errands, shopping? N N  Preparing Food and eating ? N -  Using the Toilet? N -  In the past six months, have you accidently leaked urine? N -  Do you have  problems with loss of bowel control? N -  Managing your Medications? N -  Managing your Finances? N -  Housekeeping or managing your Housekeeping? N -  Some recent data might be hidden    Patient Care Team: Marin Olp, MD as PCP - General (Family Medicine) Eustace Moore, MD as Consulting Physician (Neurosurgery) Pieter Partridge, DO as Consulting Physician (Neurology)    Assessment:   This is a routine wellness examination for [facility].  Exercise Activities and Dietary recommendations Current Exercise Habits: The patient does not participate in regular exercise at present  Goals    . Patient Stated     Find an activity that you find stimulating Given resources May try to find a pool somewhere close        [place]  03/24/2019 06/24/2018 01/11/2018 09/30/2017 08/13/2017  Falls in the past year? 0 1 No No No  Number falls in past yr: 0 0 - - -  Injury with Fall? 0 0 - - -  Risk for fall due to : Impaired mobility - - - -  Follow up Education provided;Falls prevention discussed Falls evaluation completed - - -    Depression Screen PHQ 2/9 Scores 03/24/2019 09/20/2018 03/07/2018 09/30/2017  PHQ - 2 Score 2 4 - 0  PHQ- 9 Score 2 15 - -  Exception Documentation - - Patient refusal -     Cognitive Function MMSE - Mini Mental State Exam 09/30/2017  Not completed: (No Data)   No cognitive concerns at this time; full assessment not performed due to nature of visit   Immunization History  Administered Date(s) Administered  . Influenza, High Dose Seasonal PF 05/05/2013, 04/28/2016, 06/16/2018  . Influenza,inj,Quad PF,6+ Mos 04/24/2014, 05/06/2015, 04/26/2017  . Pneumococcal Conjugate-13 04/11/2015  . Pneumococcal Polysaccharide-23 05/05/2013  . Tdap 01/27/2011    Qualifies for Shingles Vaccine?Discussed and patient will check with pharmacy  for coverage.  Patient education handout provided    Screening Tests Health Maintenance  Topic Date Due  . DEXA SCAN   03/11/1995  . INFLUENZA VACCINE  03/04/2019  . TETANUS/TDAP  01/26/2021  . PNA vac Low Risk Adult  Completed    Cancer Screenings: Breast:  Up to date on Mammogram? Not indicated    Up to date of Bone Density/Dexa? Not indicated  Colorectal: not indicated; last colonoscopy 01/28/12    Plan:    I have personally reviewed and addressed the Medicare Annual Wellness questionnaire and have noted the following in the patient's chart:  A. Medical and social history B. Use of alcohol, tobacco or illicit drugs  C. Current medications and supplements D. Functional ability and status E.  Nutritional status F.  Physical activity G. Advance directives H. List of other physicians I.  Hospitalizations, surgeries, and ER visits in previous 12 months J.  [place] such as hearing and vision if needed, cognitive and depression L. Referrals, records requested, and appointments-   In addition, I have reviewed and discussed with patient certain preventive protocols, quality metrics, and best practice recommendations. A written personalized care plan for preventive services as well as general preventive health recommendations were provided to patient.   Signed,  Denman George, LPN  Nurse Health Advisor   Nurse Notes:

## 2019-03-24 NOTE — Progress Notes (Signed)
I have personally reviewed the Medicare Annual Wellness Visit and agree with the assessment and plan.  Algis Greenhouse. Jerline Pain, MD 03/24/2019 12:31 PM

## 2019-03-24 NOTE — Patient Instructions (Signed)
[name] , Thank you for taking time to come for your Medicare Wellness Visit. I appreciate your ongoing commitment to your health goals. Please review the following plan we discussed and let me know if I can assist you in the future.   Screening recommendations/referrals: Colorectal Screening: completed; last 01/28/12 Mammogram: not indicated  Bone Density: not indicated   Vision and Dental Exams: Recommended annual ophthalmology exams for early detection of glaucoma and other disorders of the eye Recommended annual dental exams for proper oral hygiene  Vaccinations: Influenza vaccine:  recommended this fall either at PCP office or through your local pharmacy  Pneumococcal vaccine: completed 04/11/15 Tdap vaccine: up to date; last 01/27/11 Shingles vaccine: Please call your insurance company to determine your out of pocket expense for the Shingrix vaccine. You may receive this vaccine at your local pharmacy.  Advanced directives: Please bring a copy of your POA (Power of Attorney) and/or Living Will to your next appointment.  Goals: Recommend to remove any items from the home that may cause slips or trips.  Next appointment: Please schedule your Annual Wellness Visit with your Nurse Health Advisor in one year.  Preventive Care 39 Years and Older, Female Preventive care refers to lifestyle choices and visits with your health care provider that can promote health and wellness. What does preventive care include?  A yearly physical exam. This is also called an annual well check.  Dental exams once or twice a year.  Routine eye exams. Ask your health care provider how often you should have your eyes checked.  Personal lifestyle choices, including:  Daily care of your teeth and gums.  Regular physical activity.  Eating a healthy diet.  Avoiding tobacco and drug use.  Limiting alcohol use.  Practicing safe sex.  Taking low-dose aspirin every day if recommended by your  health care provider.  Taking vitamin and mineral supplements as recommended by your health care provider. What happens during an annual well check? The services and screenings done by your health care provider during your annual well check will depend on your age, overall health, lifestyle risk factors, and family history of disease. Counseling  Your health care provider may ask you questions about your:  Alcohol use.  Tobacco use.  Drug use.  Emotional well-being.  Home and relationship well-being.  Sexual activity.  Eating habits.  History of falls.  Memory and ability to understand (cognition).  Work and work Statistician.  Reproductive health. Screening  You may have the following tests or measurements:  Height, weight, and BMI.  Blood pressure.  Lipid and cholesterol levels. These may be checked every 5 years, or more frequently if you are over 51 years old.  Skin check.  Lung cancer screening. You may have this screening every year starting at age 45 if you have a 30-pack-year history of smoking and currently smoke or have quit within the past 15 years.  Fecal occult blood test (FOBT) of the stool. You may have this test every year starting at age 45.  Flexible sigmoidoscopy or colonoscopy. You may have a sigmoidoscopy every 5 years or a colonoscopy every 10 years starting at age 30.  Hepatitis C blood test.  Hepatitis B blood test.  Sexually transmitted disease (STD) testing.  Diabetes screening. This is done by checking your blood sugar (glucose) after you have not eaten for a while (fasting). You may have this done every 1-3 years.  Bone density scan. This is done to screen for osteoporosis. You may  have this done starting at age 17.  Mammogram. This may be done every 1-2 years. Talk to your health care provider about how often you should have regular mammograms. Talk with your health care provider about your test results, treatment options, and if  necessary, the need for more tests. Vaccines  Your health care provider may recommend certain vaccines, such as:  Influenza vaccine. This is recommended every year.  Tetanus, diphtheria, and acellular pertussis (Tdap, Td) vaccine. You may need a Td booster every 10 years.  Zoster vaccine. You may need this after age 94.  Pneumococcal 13-valent conjugate (PCV13) vaccine. One dose is recommended after age 16.  Pneumococcal polysaccharide (PPSV23) vaccine. One dose is recommended after age 62. Talk to your health care provider about which screenings and vaccines you need and how often you need them. This information is not intended to replace advice given to you by your health care provider. Make sure you discuss any questions you have with your health care provider. Document Released: 08/16/2015 Document Revised: 04/08/2016 Document Reviewed: 05/21/2015 Elsevier Interactive Patient Education  2017 [place] Prevention in the Home Falls can cause injuries. They can happen to people of all ages. There are many things you can do to make your home safe and to help prevent falls. What can I do on the outside of my home?  Regularly fix the edges of walkways and driveways and fix any cracks.  Remove anything that might make you trip as you walk through a door, such as a raised step or threshold.  Trim any bushes or trees on the path to your home.  Use bright outdoor lighting.  Clear any walking paths of anything that might make someone trip, such as rocks or tools.  Regularly check to see if handrails are loose or broken. Make sure that both sides of any steps have handrails.  Any raised decks and porches should have guardrails on the edges.  Have any leaves, snow, or ice cleared regularly.  Use sand or salt on walking paths during winter.  Clean up any spills in your garage right away. This includes oil or grease spills. What can I do in the bathroom?  Use night lights.   Install grab bars by the toilet and in the tub and shower. Do not use towel bars as grab bars.  Use non-skid mats or decals in the tub or shower.  If you need to sit down in the shower, use a plastic, non-slip stool.  Keep the floor dry. Clean up any water that spills on the floor as soon as it happens.  Remove soap buildup in the tub or shower regularly.  Attach bath mats securely with double-sided non-slip rug tape.  Do not have throw rugs and other things on the floor that can make you trip. What can I do in the bedroom?  Use night lights.  Make sure that you have a light by your bed that is easy to reach.  Do not use any sheets or blankets that are too big for your bed. They should not hang down onto the floor.  Have a firm chair that has side arms. You can use this for support while you get dressed.  Do not have throw rugs and other things on the floor that can make you trip. What can I do in the kitchen?  Clean up any spills right away.  Avoid walking on wet floors.  Keep items that you use a lot in  easy-to-reach places.  If you need to reach something above you, use a strong step stool that has a grab bar.  Keep electrical cords out of the way.  Do not use floor polish or wax that makes floors slippery. If you must use wax, use non-skid floor wax.  Do not have throw rugs and other things on the floor that can make you trip. What can I do with my stairs?  Do not leave any items on the stairs.  Make sure that there are handrails on both sides of the stairs and use them. Fix handrails that are broken or loose. Make sure that handrails are as long as the stairways.  Check any carpeting to make sure that it is firmly attached to the stairs. Fix any carpet that is loose or worn.  Avoid having throw rugs at the top or bottom of the stairs. If you do have throw rugs, attach them to the floor with carpet tape.  Make sure that you have a light switch at the top of the  stairs and the bottom of the stairs. If you do not have them, ask someone to add them for you. What else can I do to help prevent falls?  Wear shoes that:  Do not have high heels.  Have rubber bottoms.  Are comfortable and fit you well.  Are closed at the toe. Do not wear sandals.  If you use a stepladder:  Make sure that it is fully opened. Do not climb a closed stepladder.  Make sure that both sides of the stepladder are locked into place.  Ask someone to hold it for you, if possible.  Clearly mark and make sure that you can see:  Any grab bars or handrails.  First and last steps.  Where the edge of each step is.  Use tools that help you move around (mobility aids) if they are needed. These include:  Canes.  Walkers.  Scooters.  Crutches.  Turn on the lights when you go into a dark area. Replace any light bulbs as soon as they burn out.  Set up your furniture so you have a clear path. Avoid moving your furniture around.  If any of your floors are uneven, fix them.  If there are any pets around you, be aware of where they are.  Review your medicines with your doctor. Some medicines can make you feel dizzy. This can increase your chance of falling. Ask your doctor what other things that you can do to help prevent falls. This information is not intended to replace advice given to you by your health care provider. Make sure you discuss any questions you have with your health care provider. Document Released: 05/16/2009 Document Revised: 12/26/2015 Document Reviewed: 08/24/2014 Elsevier Interactive Patient Education  2017 [employer].

## 2019-03-27 ENCOUNTER — Telehealth: Payer: Self-pay

## 2019-03-27 NOTE — Telephone Encounter (Signed)
Noted  

## 2019-03-27 NOTE — Telephone Encounter (Signed)
Copied from [place] [id number]. Topic: Referral - Status >> Mar 27, 2019 [id number] AM Simone Curia D wrote: [phone] Spoke with patient gave Tammy McKee's number for [place] [facility] they are the local diaper bank for this area. Tammy has to speak to the recipient and explain the  program and get their information to sign them up.  Will follow up later this week. Ambrose Mantle [phone]

## 2019-04-04 ENCOUNTER — Other Ambulatory Visit: Payer: Self-pay | Admitting: Neurology

## 2019-04-04 ENCOUNTER — Other Ambulatory Visit: Payer: Self-pay | Admitting: Family Medicine

## 2019-04-06 ENCOUNTER — Telehealth: Payer: Self-pay

## 2019-04-06 NOTE — Telephone Encounter (Signed)
Copied from [place] [phone]. Topic: Referral - Status >> Apr 06, 2019  99991111 PM Simone Curia D wrote: [id number] Spoke with patient about adult diaper bank. She was unable to contact Burr Medico at [employer]. Left message go Tammy to return my call. Ambrose Mantle [phone]

## 2019-04-18 ENCOUNTER — Telehealth: Payer: Self-pay

## 2019-04-18 NOTE — Telephone Encounter (Signed)
Copied from [place] [id number]. Topic: Referral - Status >> Apr 18, 2019 [id number] AM Simone Curia D wrote: [id number] Spoke with Lorina Rabon patient's daughter about adult diaper bank at [facility] gave her Tammy McKee's number. Ambrose Mantle [phone]

## 2019-04-20 ENCOUNTER — Other Ambulatory Visit: Payer: Self-pay

## 2019-04-20 ENCOUNTER — Ambulatory Visit: Payer: Medicare Other | Admitting: Family Medicine

## 2019-04-20 ENCOUNTER — Encounter: Payer: Self-pay | Admitting: Family Medicine

## 2019-04-20 VITALS — BP 100/60 | HR 72 | Temp 97.7°F | Wt 188.8 lb

## 2019-04-20 DIAGNOSIS — I10 Essential (primary) hypertension: Secondary | ICD-10-CM | POA: Diagnosis not present

## 2019-04-20 DIAGNOSIS — Z79899 Other long term (current) drug therapy: Secondary | ICD-10-CM

## 2019-04-20 DIAGNOSIS — F3342 Major depressive disorder, recurrent, in full remission: Secondary | ICD-10-CM | POA: Diagnosis not present

## 2019-04-20 DIAGNOSIS — Z23 Encounter for immunization: Secondary | ICD-10-CM | POA: Diagnosis not present

## 2019-04-20 DIAGNOSIS — E7800 Pure hypercholesterolemia, unspecified: Secondary | ICD-10-CM | POA: Diagnosis not present

## 2019-04-20 DIAGNOSIS — I480 Paroxysmal atrial fibrillation: Secondary | ICD-10-CM

## 2019-04-20 DIAGNOSIS — Z8709 Personal history of other diseases of the respiratory system: Secondary | ICD-10-CM

## 2019-04-20 DIAGNOSIS — I700 Atherosclerosis of aorta: Secondary | ICD-10-CM

## 2019-04-20 DIAGNOSIS — C8510 Unspecified B-cell lymphoma, unspecified site: Secondary | ICD-10-CM

## 2019-04-20 DIAGNOSIS — J449 Chronic obstructive pulmonary disease, unspecified: Secondary | ICD-10-CM

## 2019-04-20 DIAGNOSIS — R21 Rash and other nonspecific skin eruption: Secondary | ICD-10-CM

## 2019-04-20 DIAGNOSIS — G40109 Localization-related (focal) (partial) symptomatic epilepsy and epileptic syndromes with simple partial seizures, not intractable, without status epilepticus: Secondary | ICD-10-CM

## 2019-04-20 DIAGNOSIS — I5030 Unspecified diastolic (congestive) heart failure: Secondary | ICD-10-CM

## 2019-04-20 LAB — COMPREHENSIVE METABOLIC PANEL
ALT: 13 U/L (ref 0–35)
AST: 14 U/L (ref 0–37)
Albumin: 4 g/dL (ref 3.5–5.2)
Alkaline Phosphatase: 67 U/L (ref 39–117)
BUN: 18 mg/dL (ref 6–23)
CO2: 31 mEq/L (ref 19–32)
Calcium: 9.6 mg/dL (ref 8.4–10.5)
Chloride: 103 mEq/L (ref 96–112)
Creatinine, Ser: 1.17 mg/dL (ref 0.40–1.20)
GFR: 43.54 mL/min — ABNORMAL LOW (ref >60.00)
Glucose, Bld: 84 mg/dL (ref 70–99)
Potassium: 4.3 mEq/L (ref 3.5–5.1)
Sodium: 140 mEq/L (ref 135–145)
Total Bilirubin: 0.6 mg/dL (ref 0.2–1.2)
Total Protein: 6.7 g/dL (ref 6.0–8.3)

## 2019-04-20 LAB — LIPID PANEL
Cholesterol: 96 mg/dL (ref 0–200)
HDL: 30.8 mg/dL — ABNORMAL LOW (ref >39.00)
NonHDL: 65.47
Total CHOL/HDL Ratio: 3
Triglycerides: 266 mg/dL — ABNORMAL HIGH (ref 0.0–149.0)
VLDL: 53.2 mg/dL — ABNORMAL HIGH (ref 0.0–40.0)

## 2019-04-20 LAB — CBC WITH DIFFERENTIAL/PLATELET
Basophils Absolute: 0 10*3/uL (ref 0.0–0.1)
Basophils Relative: 0.7 % (ref 0.0–3.0)
Eosinophils Absolute: 0.2 10*3/uL (ref 0.0–0.7)
Eosinophils Relative: 2.8 % (ref 0.0–5.0)
HCT: 38.7 % (ref 36.0–46.0)
Hemoglobin: 12.4 g/dL (ref 12.0–15.0)
Lymphocytes Relative: 29.8 % (ref 12.0–46.0)
Lymphs Abs: 1.7 10*3/uL (ref 0.7–4.0)
MCHC: 32.1 g/dL (ref 30.0–36.0)
MCV: 89.3 fl (ref 78.0–100.0)
Monocytes Absolute: 0.5 10*3/uL (ref 0.1–1.0)
Monocytes Relative: 8.2 % (ref 3.0–12.0)
Neutro Abs: 3.3 10*3/uL (ref 1.4–7.7)
Neutrophils Relative %: 58.5 % (ref 43.0–77.0)
Platelets: 180 10*3/uL (ref 150.0–400.0)
RBC: 4.34 Mil/uL (ref 3.87–5.11)
RDW: 14.8 % (ref 11.5–15.5)
WBC: 5.6 10*3/uL (ref 4.0–10.5)

## 2019-04-20 LAB — LDL CHOLESTEROL, DIRECT: Direct LDL: 40 mg/dL

## 2019-04-20 LAB — VITAMIN B12: Vitamin B-12: 306 pg/mL (ref 211–911)

## 2019-04-20 MED ORDER — TRIAMCINOLONE ACETONIDE 0.1 % EX CREA: 1.0000 "application " | TOPICAL_CREAM | Freq: Two times a day (BID) | CUTANEOUS | 0 refills | Status: DC

## 2019-04-20 NOTE — Patient Instructions (Addendum)
Health Maintenance Due  Topic Date Due  . INFLUENZA VACCINE -today 03/04/2019    4-6 months for a physical   Try triamcinolone up to 10 days twice a day for rash on neck. Take at least a 10 day break after using for 10 days.   Can call to schedule Dr. Benay Spice for follow up   Please stop by lab before you go If you do not have mychart- we will call you about results within 5 business days of [place] receiving them.  If you have mychart- we will send your results within 3 business days of [place] receiving them.  If abnormal or we want to clarify a result, we will call or mychart you to make sure you receive the message.  If you have questions or concerns or don't hear within 5-7 days, please send [place] a message or call us.

## 2019-04-20 NOTE — Addendum Note (Signed)
Addended by: Jasper Loser on: 04/20/2019 12:05 PM   Modules accepted: Orders

## 2019-04-20 NOTE — Progress Notes (Signed)
Phone [phone]   Subjective:  [name] is a 85 y.o. year old very pleasant female patient who presents for/with See problem oriented charting Chief Complaint  Patient presents with   Follow-up   Hypertension   Chronic Kidney Disease   Hyperlipidemia   Depression   Anxiety   ROS- easy bruising/bleeding noted.  No chest pain. Denies shortness of breath but rather sedentary. No recent seizures. Mild cough for years and years- no recent worsening. Several chest x-rays over years reassuring  Past Medical History-  Patient Active Problem List   Diagnosis Date Noted   Diastolic CHF ([place]) [id number]    Priority: High   History of cardioembolic stroke [id number]    Priority: High   Spinal stenosis, lumbar region, with neurogenic claudication 05/09/2014    Priority: High   History of Focal seizure ([place]) 06/16/2013    Priority: High   TIA (transient ischemic attack) 05/31/2013    Priority: High   History of Large B-cell lymphoma ([place]) 02/18/2012    Priority: High   Atrial fibrillation ([place]) 12/14/2008    Priority: High   Agitation 03/11/2018    Priority: Medium   History of chronic respiratory failure- prior on o2 06/15/2017    Priority: Medium   Bacteremia due to Streptococcus 11/21/2015    Priority: Medium   Cellulitis of leg, right 11/20/2015    Priority: Medium   Diverticulitis of colon 09/26/2014    Priority: Medium   CKD (chronic kidney disease), stage III ([place]) 09/26/2014    Priority: Medium   Laryngeal carcinoma ([place])     Priority: Medium   Anxiety state 10/20/2008    Priority: Medium   Depression 10/20/2008    Priority: Medium   Dyspnea on exertion 07/06/2008    Priority: Medium   COPD GOLD II 03/26/2008    Priority: Medium   Hyperlipidemia 03/18/2007    Priority: Medium   Essential hypertension 03/18/2007    Priority: Medium   Chronic diarrhea 09/04/2017    Priority: Low   Aortic atherosclerosis ([place]) 06/30/2016    Priority: Low   Weakness generalized 06/30/2013    Priority: Low   Bilateral hand pain 01/09/2013    Priority: Low   Chest pain 04/25/2018   GERD (gastroesophageal reflux disease) 05/03/2007    Medications- reviewed and updated Current Outpatient Medications  Medication Sig Dispense Refill   atorvastatin (LIPITOR) 20 MG tablet TAKE 1 TABLET BY MOUTH EVERY DAY 90 tablet 1   bisoprolol (ZEBETA) 5 MG tablet TAKE 1 TABLET BY MOUTH EVERY DAY 90 tablet 1   busPIRone ([name]) 5 MG tablet TAKE 1 TABLET BY MOUTH TWICE A DAY 180 tablet 3   Cyanocobalamin (VITAMIN B-12 PO) Take 1 tablet by mouth daily.     gabapentin (NEURONTIN) 100 MG capsule TAKE 1 CAPSULE BY MOUTH THREE TIMES A DAY 270 capsule 0   ketoconazole (NIZORAL) 2 % cream Apply 1 application topically daily. 15 g 0   levETIRAcetam (KEPPRA) 1000 MG tablet TAKE 1 TABLET BY MOUTH TWICE A DAY 180 tablet 1   loperamide (IMODIUM) 2 MG capsule Take 2 mg by mouth daily as needed for diarrhea or loose stools.      omeprazole (PRILOSEC) 20 MG capsule Take 20 mg by mouth daily.     Rivaroxaban (XARELTO) 15 MG TABS tablet TAKE 1 TABLET BY MOUTH ONCE DAILY WITH SUPPER 30 tablet 11   sertraline (ZOLOFT) 25 MG tablet TAKE 1 TABLET BY MOUTH EVERY DAY 90 tablet 0   cholecalciferol (  VITAMIN D) 1000 UNITS tablet Take 1,000 Units by mouth daily.     triamcinolone cream (KENALOG) 0.1 % Apply 1 application topically 2 (two) times daily. For 7-10 days maximum 80 g 0   No current facility-administered medications for this visit.      Objective:  BP 100/60    Pulse 72    Temp 97.7 F (36.5 C)    Wt 188 lb 12.8 oz (85.6 kg)    LMP  (LMP Unknown)    SpO2 92%    BMI 32.41 kg/m  Gen: NAD, resting comfortably CV: RRR no murmurs rubs or gallops Lungs: CTAB no crackles, wheeze, rhonchi Abdomen: soft/nontender/nondistended/normal bowel sounds.  Ext: no edema Skin: warm, dry    Assessment and Plan   #hypertension S: controlled on  Bisoprolol 5 mg daily. Pt states her BP is running well usually ranging 110/80. Pt states she also has had vision changes due to her stroke. Pt states she has had headaches and shakiness occasionally. She eats fast food in moderation and no exercise. BP Readings from Last 3 Encounters:  04/20/19 100/60  02/02/19 130/70  09/28/18 122/80  A/P: Stable. Continue current medications.   #hyperlipidemia/history of TIA and stroke- may have been cardioembolic S: Hopefully controlled on Atorvastatin 20 mg daily.  Prefer LDL under 70 Lab Results  Component Value Date   CHOL 170 08/07/2014   HDL 33 (L) 08/07/2014   LDLCALC 90 08/07/2014   LDLDIRECT 48.0 08/27/2016   TRIG 235 (H) 08/07/2014   CHOLHDL 5.2 08/07/2014   A/P: Suspect controlled-LDL goal under 70-update lipids today  # Depression/Anxiety S:Buspirone 5 mg BID and Sertraline 25 mg daily.  Depression screen [facility] 2/9 04/20/2019  Decreased Interest 3  Down, Depressed, Hopeless 0  PHQ - 2 Score 3  Altered sleeping 0  Tired, decreased energy 0  Change in appetite 0  Feeling bad or failure about yourself  0  Trouble concentrating 0  Moving slowly or fidgety/restless 0  Suicidal thoughts 0  PHQ-9 Score 3  Difficult doing work/chores Not difficult at all  Some recent data might be hidden  A/P: Reasonable control based off PHQ 9-continue current medications   #High risk medication use- we will check B12 level given long-term PPI use  % Large B-cell lymphoma- had follow-up with Dr. Benay Spice June 2019- was in clinical remission from non-Hodgkin's lymphoma.  They had planned for one-year follow-up-I encouraged her to call to schedule. She states possibly will be released.   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 Tomi Likens.  #rash on neck- does wear necklace and advised to avoid. Gets red spots on neck/papules (noted on exam).  Advised trial triamcinolone as pruritic at times  COPD mixed type ([place]), Chronic Chronic respiratory failure with hypoxia (HCC), Chronic- in past used oxygen- off over a year S: no recent issues- not using inhalers A/P: stable, continue to monitor   Diastolic congestive heart failure, unspecified HF chronicity ([place]), Chronic S: fortunately has not had to use lasix recently- suspected diagnosis based on LVH and prior pulmonary eema A/P: stable, continue to monitor   Aortic atherosclerosis ([place]), Chronic S: noted on prior imaging A/P:  Continue risk factor modification  Paroxysmal atrial fibrillation ([place]), Chronic S: a fib diagnosed around 2010. On xarelto for anticoagulation. Bisoprolol for rate control A/P: appears stable- continue current meds   Recommended follow up: 4-6 months for a physical   Lab/Order associations: not  fasting   ICD-10-CM   1. Pure hypercholesterolemia  E78.00 Comprehensive metabolic panel    Lipid panel    CBC with Differential/Platelet  2. Essential hypertension  I10 Comprehensive metabolic panel    Lipid panel    CBC with Differential/Platelet  3. Recurrent major depressive disorder, in full remission ([place])  F33.42   4. History of Large B-cell lymphoma ([place])  C85.10   5. COPD mixed type (HCC) Chronic J44.9   6. Diastolic congestive heart failure, unspecified HF chronicity (HCC) Chronic I50.30   7. Aortic atherosclerosis (HCC) Chronic I70.0   8. History of chronic respiratory failure Chronic Z87.09   9. Paroxysmal atrial fibrillation (HCC) Chronic I48.0   10. 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 High risk medication use  Z79.899 Vitamin B12    Meds ordered this encounter  Medications   triamcinolone cream (KENALOG) 0.1 %    Sig: Apply 1 application topically 2 (two) times daily. For 7-10 days maximum    Dispense:  80 g    Refill:  0   Return precautions advised.  [name], MD

## 2019-04-25 ENCOUNTER — Telehealth: Payer: Self-pay

## 2019-04-25 NOTE — Telephone Encounter (Signed)
Copied from [place] [id number]. Topic: Referral - Status >> Apr 25, 2019  [id number] PM Simone Curia D wrote: [id number] Spoke with Lorina Rabon patient's daughter about adult diaper bank at [facility]. The patient spoke with Lynelle Smoke and is signed up for the program. Ambrose Mantle [phone]

## 2019-05-15 ENCOUNTER — Telehealth: Payer: Self-pay | Admitting: Nurse Practitioner

## 2019-05-15 NOTE — Telephone Encounter (Signed)
Returned patient's phone call regarding rescheduling an appointment, per patient's request cancelled June appointment has moved to 10/14.

## 2019-05-17 ENCOUNTER — Inpatient Hospital Stay: Payer: Medicare Other | Attending: Nurse Practitioner | Admitting: Nurse Practitioner

## 2019-05-17 ENCOUNTER — Encounter: Payer: Self-pay | Admitting: Nurse Practitioner

## 2019-05-17 ENCOUNTER — Other Ambulatory Visit: Payer: Self-pay

## 2019-05-17 VITALS — BP 111/58 | HR 71 | Temp 98.2°F | Resp 17 | Ht 64.0 in | Wt 191.1 lb

## 2019-05-17 DIAGNOSIS — K625 Hemorrhage of anus and rectum: Secondary | ICD-10-CM | POA: Diagnosis not present

## 2019-05-17 DIAGNOSIS — C8510 Unspecified B-cell lymphoma, unspecified site: Secondary | ICD-10-CM | POA: Diagnosis not present

## 2019-05-17 DIAGNOSIS — J449 Chronic obstructive pulmonary disease, unspecified: Secondary | ICD-10-CM | POA: Diagnosis not present

## 2019-05-17 DIAGNOSIS — I4891 Unspecified atrial fibrillation: Secondary | ICD-10-CM | POA: Diagnosis not present

## 2019-05-17 DIAGNOSIS — C8590 Non-Hodgkin lymphoma, unspecified, unspecified site: Secondary | ICD-10-CM | POA: Insufficient documentation

## 2019-05-17 NOTE — Progress Notes (Signed)
[place] OFFICE PROGRESS NOTE   Diagnosis: Non-Hodgkin's lymphoma  INTERVAL HISTORY:   [name] returns as scheduled.  No fevers or sweats.  Appetite is "not so good".  No weight loss.  She had a recent episode of crampy abdominal pain and rectal bleeding.  The bleeding persisted for several days.  She reports a history of hemorrhoids.  Objective:  Vital signs in last 24 hours:  Blood pressure (!) 111/58, pulse 71, temperature 98.2 F (36.8 C), resp. rate 17, height 5\' 4"  (1.626 m), weight 191 lb 1.6 oz (86.7 kg), SpO2 91 %.    HEENT: Neck without mass. Lymphatics: No palpable cervical, supraclavicular, axillary or inguinal lymph nodes. GI: Abdomen soft and nontender.  No hepatosplenomegaly.  She declined a rectal exam. Vascular: No leg edema.    Lab Results:  Lab Results  Component Value Date   WBC 5.6 04/20/2019   HGB 12.4 04/20/2019   HCT 38.7 04/20/2019   MCV 89.3 04/20/2019   PLT 180.0 04/20/2019   NEUTROABS 3.3 04/20/2019    Imaging:  No results found.  Medications: I have reviewed the patient's current medications.  Assessment/Plan: 1. Non-Hodgkin's lymphoma, large B-cell lymphoma, CD20 positive involving 8 needle core biopsy of a right iliac lymph node 02/12/2012. CT of the abdomen and pelvis on 01/15/2012 showed abdominal/pelvic lymphadenopathy. Serum LDH in normal range on 02/25/2012. Staging PET scan 02/25/2012 with a small lymph node posteriolateral to the oropharynx on the left measuring 9 x 7 mm with SUV 5.4; large cluster of hypermetabolic lymph nodes in the right hilum with SUV 4.3; cluster of mildly enlarged hepatoduodenal ligament lymph nodes and mildly enlarged portacaval lymph node with hypermetabolic activity (SUV max 37.1); numerous borderline enlarged and mildly enlarged periaortic retroperitoneal lymph nodes with extensive hypermetabolic activity (SUV max 43.6-47.6); numerous borderline enlarged and mildly enlarged  hypermetabolic lymph nodes along the pelvic sidewall bilaterally most pronounced on the right with a right external iliac lymph node measuring 2.9 x 4.1 cm (SUV max 52.5). Bone marrow aspiration/biopsy 03/09/2012 negative for involvement with lymphoma. Status post cycle 1 of CHOP-Rituxan on 03/15/2012. She completed cycle 3 on 05/04/2012 and cycle 4 on 05/25/2012. Restaging PET scan on 06/10/2012 was markedly improved with resolution of the thoracic and retroperitoneal adenopathy and near complete resolution of porta hepatis adenopathy. She completed cycle 6 of CHOP/Rituxan on 07/06/2012. 2. History of night sweats likely secondary to #1. 3. Rectal bleeding June 2013 status post negative colonoscopy 01/28/2012 and upper endoscopy 02/11/2012. The bleeding was likely related to hemorrhoids. 4. COPD. 5. Remote history of larynx cancer. 6. Status post Port-A-Cath placement 03/09/2012. Removed 09/13/2012. 7. 2-D echo 03/11/2012 with left ventricular ejection fraction 60-65%. 8. Admission with febrile neutropenia 03/22/2012 with no source for infection identified 9. Admission with fever and a chest x-ray consistent with "pneumonia"on 03/27/2012. She completed a course of Levaquin. 10. Pancytopenia following cycle 1 of CHOP-rituximab-resolved. 11. History of atrial fibrillation-maintained on xarelto 12. Numbness/tingling in the fingertips and toes. The numbness in the toes predated the start of chemotherapy.. She also reports the tingling in the fingertips was present prior to the start of chemotherapy. The vincristine was dose reduced with cycle 2. Vincristine was discontinued beginning with cycle 3. 13. Cardioembolic strokes, seizures 2014. She is followed by neurology. 38. Hospitalization with pneumonia November 2014 and April 2017    Disposition: [name] remains in clinical remission from non-Hodgkin's lymphoma.  She would like to continue annual follow-up at the cancer center.  She  had recent  rectal bleeding.  She declined a rectal exam today.  She will complete a set of stool cards.  She will follow-up with Dr. Yong Channel to discuss gastroenterology evaluation.  She will return for a follow-up visit here in 1 year.  I discussed the above with [name] husband and daughter.  Plan reviewed with Dr. Benay Spice.    [name] ANP/GNP-BC   05/17/2019  12:36 PM

## 2019-05-19 ENCOUNTER — Telehealth: Payer: Self-pay | Admitting: Oncology

## 2019-05-19 NOTE — Telephone Encounter (Signed)
I will mail a calendar

## 2019-05-23 ENCOUNTER — Other Ambulatory Visit: Payer: Self-pay

## 2019-05-23 ENCOUNTER — Telehealth: Payer: Self-pay

## 2019-05-23 DIAGNOSIS — K219 Gastro-esophageal reflux disease without esophagitis: Secondary | ICD-10-CM

## 2019-05-23 NOTE — Telephone Encounter (Signed)
Referral placed.

## 2019-05-23 NOTE — Telephone Encounter (Signed)
Copied from [place] [id number]. Topic: Referral - Request for Referral >> May 23, 2019 11:53 AM Rutherford Nail, NT wrote: Has patient seen PCP for this complaint? No. *If NO, is insurance requiring patient see PCP for this issue before PCP can refer them? Referral for which specialty: Gastroenterology Preferred provider/office: [facility] Reason for referral: States that oncology wanted her to get a referral from PCP for GI.

## 2019-05-23 NOTE — Progress Notes (Unsigned)
Referral to GI placed

## 2019-06-01 ENCOUNTER — Other Ambulatory Visit: Payer: Self-pay

## 2019-06-01 ENCOUNTER — Ambulatory Visit (INDEPENDENT_AMBULATORY_CARE_PROVIDER_SITE_OTHER): Payer: Medicare Other | Admitting: Gastroenterology

## 2019-06-01 ENCOUNTER — Encounter: Payer: Self-pay | Admitting: Gastroenterology

## 2019-06-01 VITALS — BP 120/72 | HR 78 | Temp 98.5°F | Ht 64.0 in | Wt 191.0 lb

## 2019-06-01 DIAGNOSIS — R195 Other fecal abnormalities: Secondary | ICD-10-CM | POA: Insufficient documentation

## 2019-06-01 DIAGNOSIS — K649 Unspecified hemorrhoids: Secondary | ICD-10-CM | POA: Insufficient documentation

## 2019-06-01 MED ORDER — HYDROCORTISONE (PERIANAL) 2.5 % EX CREA: 1.0000 "application " | TOPICAL_CREAM | Freq: Every day | CUTANEOUS | 0 refills | Status: DC

## 2019-06-01 NOTE — Progress Notes (Signed)
06/01/2019 [name] [id number] [birth year]   HISTORY OF PRESENT ILLNESS: This is a pleasant 85 year old female who is a patient of Dr. Corena Pilgrim.  She presents here today with complaints of primarily loose stools.  She has history of mild CVA and TIAs and is on Xarelto.  She tells me that she usually has loose stools that have been present ever since her surgery for perforated diverticulitis in 2010.  It seems that she had a segmental resection for persistent diverticulitis and was complicated by perforation with abscess and need for further surgery.  She describes episodes of loose stool with urgency and leakage of stool.  Then she also says that at times she becomes constipated and has hard stools with straining.  She uses Imodium, but then that tends to make her constipated so she chases that with laxatives.  She saw Dr. Loletha Carrow for the same issue in September 2017 at which time he recommended a fiber supplement, but she admits that she did not use them regularly for any extended period of time.  She admits that she also has leakage of stools as well and is declining rectal exam today due to this issue.  She says that she knows she has hemorrhoids and occasionally they do bleed.  She usually uses over-the-counter Preparation H or other hemorrhoid type cream.  Colonoscopy 01/2012 showed diverticulosis in the sigmoid colon and colitis in sigmoid colon.  Biopsy showed benign colonic mucosa.    Past Medical History:  Diagnosis Date   ABDOMINAL INCISIONAL HERNIA 04/03/2010       Anxiety    Arthritis    "lots; hands" (05/26/2013)   Blood transfusion    "w/1st miscarriage" (05/26/2013)   COPD (chronic obstructive pulmonary disease) (HCC)    Coronary artery disease    Depression    Diverticulitis of colon with hemorrhage 07/24/2008   DVT (deep venous thrombosis) ([place]) 1954   "after I had my first child" (05/26/2013)   Exertional shortness of breath    Family history of  anesthesia complication    "sisters also get sick as a dog" (05/26/2013)   GERD 05/03/2007       GERD (gastroesophageal reflux disease)    H/O hiatal hernia    HCAP (healthcare-associated pneumonia) 06/26/2013   Heart murmur    "when I was a child" (05/26/2013)   History of TIAs    Hyperlipidemia    Hypertension    Laryngeal carcinoma (HCC) hx of ca   remotely with resection and xrt/chronic hoarsemenss   Lymphona, mantle cell, inguinal region/lower limb ([place])    "large c-cell" (05/26/2013)   OSTEOARTHRITIS 05/03/2007   PEPTIC ULCER DISEASE 07/06/2008   Pneumonia 2013   "once" (05/26/2013)   PUD (peptic ulcer disease)    PVD (peripheral vascular disease) ([place])    Seizures ([place])    Stroke ([place]) 05/26/2013   "very mild; affected my speech for a while" (05/26/2013)   VARICOSE VEINS LOWER EXTREMITIES W/INFLAMMATION 01/15/2009   Past Surgical History:  Procedure Laterality Date   ABDOMINAL EXPLORATION SURGERY  12/01/2012   APPENDECTOMY  09/13/2008   CAROTID ENDARTERECTOMY Bilateral 2004   2004 a month apart right and left   CARPAL TUNNEL RELEASE Left ?1960's   CATARACT EXTRACTION  2005   bilateral   COLON SURGERY  09/13/2008   Laparoscopic assisted converted to open sigmoid colectomy.Archie Endo 09/13/2008 (05/26/2013)   DILATION AND CURETTAGE OF UTERUS  1950's   "had 2 for miscarriages" (05/26/2013)   DIVERTING ILEOSTOMY  09/22/2008   iliostomy for diverticulitis/notes 09/22/2008 (05/26/2013)   HERNIA REPAIR  05/2010   ventral hernia repair   ILEOSTOMY CLOSURE  01/2010   RADICAL NECK DISSECTION  1986   "larynx cancer" (05/26/2013)   TONSILLECTOMY AND ADENOIDECTOMY  1936   "tonsils grew back; no 2nd OR" (05/26/2013)   TOTAL KNEE ARTHROPLASTY Bilateral    2002 left 2008 right   TUBAL LIGATION  1972    reports that she quit smoking about 34 years ago. Her smoking use included cigarettes. She has a 92.50 pack-year smoking history. She has never used  smokeless tobacco. She reports that she does not drink alcohol or use drugs. family history includes Cancer in her father; Colon cancer in her sister; Hyperlipidemia in her mother; Stroke in her mother. Allergies  Allergen Reactions   Ativan [Lorazepam] Other (See Comments)    Severe hallucinations    Propoxyphene Hcl Nausea And Vomiting   Codeine Itching   Penicillins Rash    Has patient had a PCN reaction causing immediate rash, facial/tongue/throat swelling, SOB or lightheadedness with hypotension: Yes Has patient had a PCN reaction causing severe rash involving mucus membranes or skin necrosis: Unk Has patient had a PCN reaction that required hospitalization: No Has patient had a PCN reaction occurring within the last 10 years: Yes If all of the above answers are "NO", then may proceed with Cephalosporin use.    Vancomycin Rash      Outpatient Encounter Medications as of 06/01/2019  Medication Sig   atorvastatin (LIPITOR) 20 MG tablet TAKE 1 TABLET BY MOUTH EVERY DAY   bisoprolol (ZEBETA) 5 MG tablet TAKE 1 TABLET BY MOUTH EVERY DAY   busPIRone ([name]) 5 MG tablet TAKE 1 TABLET BY MOUTH TWICE A DAY   cholecalciferol (VITAMIN D) 1000 UNITS tablet Take 1,000 Units by mouth daily.   Cyanocobalamin (VITAMIN B-12 PO) Take 1 tablet by mouth daily.   gabapentin (NEURONTIN) 100 MG capsule TAKE 1 CAPSULE BY MOUTH THREE TIMES A DAY   ketoconazole (NIZORAL) 2 % cream Apply 1 application topically daily.   levETIRAcetam (KEPPRA) 1000 MG tablet TAKE 1 TABLET BY MOUTH TWICE A DAY   loperamide (IMODIUM) 2 MG capsule Take 2 mg by mouth daily as needed for diarrhea or loose stools.    omeprazole (PRILOSEC) 20 MG capsule Take 20 mg by mouth daily.   Rivaroxaban (XARELTO) 15 MG TABS tablet TAKE 1 TABLET BY MOUTH ONCE DAILY WITH SUPPER   sertraline (ZOLOFT) 25 MG tablet TAKE 1 TABLET BY MOUTH EVERY DAY   triamcinolone cream (KENALOG) 0.1 % Apply 1 application topically 2 (two)  times daily. For 7-10 days maximum   No facility-administered encounter medications on file as of 06/01/2019.      REVIEW OF SYSTEMS  : All other systems reviewed and negative except where noted in the History of Present Illness.   PHYSICAL EXAM: BP 120/72    Pulse 78    Temp 98.5 F (36.9 C)    Ht 5\' 4"  (1.626 m)    Wt 191 lb (86.6 kg)    LMP  (LMP Unknown)    BMI 32.79 kg/m  General: Well developed white female in no acute distress Head: Normocephalic and atraumatic Eyes:  Sclerae anicteric, conjunctiva pink. Ears: Normal auditory acuity Lungs: Clear throughout to auscultation; no increased WOB. Heart: Regular rate and rhythm; no M/R/G. Abdomen: Soft, non-distended.  BS present.  Non-tender. Rectal:  Deferred by patient due to some incontinence issues. Musculoskeletal: Symmetrical with no  gross deformities  Skin: No lesions on visible extremities Extremities: No edema  Neurological: Alert oriented x 4, grossly non-focal Psychological:  Alert and cooperative. Normal mood and affect  ASSESSMENT AND PLAN: *85 year old female with complaints of primarily loose stools, but occasionally alternating with constipation.  This has been present since her colon resections from perforated diverticulitis several years ago.  Right now it sounds like she is chasing back and forth with antidiarrheals and then laxatives.  She also reports some leakage of stools likely due to decreased sphincter tone and control.  It was previously recommended that she take a daily fiber supplement, but she really did not give this a dedicated try.  I recommended Benefiber 2 tablespoons in 8 ounces of liquid daily for the next several weeks to help bulk the stools.  She will call back in about a month with an update on her symptoms. *Hemorrhoids:  Bleed occasionally.  Will give hydrocortisone cream to use at bedtime prn.    CC:  Marin Olp, MD

## 2019-06-01 NOTE — Progress Notes (Signed)
____________________________________________________________  Attending physician addendum:  Thank you for sending this case to me. I have reviewed the entire note, and the outlined plan seems appropriate.  Henry Danis, MD  ____________________________________________________________  

## 2019-06-01 NOTE — Patient Instructions (Addendum)
If you are age 83 or older, your body mass index should be between 23-30. Your Body mass index is 32.79 kg/m. If this is out of the aforementioned range listed, please consider follow up with your Primary Care Provider.  If you are age 9 or younger, your body mass index should be between 19-25. Your Body mass index is 32.79 kg/m. If this is out of the aformentioned range listed, please consider follow up with your Primary Care Provider.   We have sent the following medications to your pharmacy for you to pick up at your convenience:  1. Benefiber 2 tablespoon in 8-10 oz of liquid daily.  2. Apply Hydrocortisone externally every evening to the perirectal area.    3. Call back in one month with an update and ask for [place].

## 2019-06-06 ENCOUNTER — Ambulatory Visit: Payer: Medicare Other | Admitting: Physician Assistant

## 2019-06-06 ENCOUNTER — Other Ambulatory Visit: Payer: Self-pay

## 2019-06-06 ENCOUNTER — Ambulatory Visit (INDEPENDENT_AMBULATORY_CARE_PROVIDER_SITE_OTHER): Payer: Medicare Other | Admitting: Family Medicine

## 2019-06-06 ENCOUNTER — Ambulatory Visit (INDEPENDENT_AMBULATORY_CARE_PROVIDER_SITE_OTHER): Payer: Medicare Other

## 2019-06-06 ENCOUNTER — Encounter: Payer: Self-pay | Admitting: Family Medicine

## 2019-06-06 VITALS — BP 104/58 | HR 78 | Temp 97.7°F | Ht 64.0 in | Wt 192.8 lb

## 2019-06-06 DIAGNOSIS — R053 Chronic cough: Secondary | ICD-10-CM

## 2019-06-06 DIAGNOSIS — R05 Cough: Secondary | ICD-10-CM

## 2019-06-06 DIAGNOSIS — R519 Headache, unspecified: Secondary | ICD-10-CM

## 2019-06-06 DIAGNOSIS — J449 Chronic obstructive pulmonary disease, unspecified: Secondary | ICD-10-CM | POA: Diagnosis not present

## 2019-06-06 MED ORDER — ALBUTEROL SULFATE HFA 108 (90 BASE) MCG/ACT IN AERS: 2.0000 | INHALATION_SPRAY | Freq: Four times a day (QID) | RESPIRATORY_TRACT | 1 refills | Status: DC | PRN

## 2019-06-06 NOTE — Progress Notes (Addendum)
Phone [phone]  Subjective:  In person visit [name] is a 85 y.o. year old very pleasant female patient who presents for/with See problem oriented charting Chief Complaint  Patient presents with  . Posterior headache     ROS- No facial or extremity weakness. No slurred words or trouble swallowing. no blurry vision or double vision. No paresthesias. No confusion or word finding difficulties above baseline.   Past Medical History-  Patient Active Problem List   Diagnosis Date Noted  . Diastolic CHF ([place]) A999333    Priority: High  . History of cardioembolic stroke [id number]    Priority: High  . Spinal stenosis, lumbar region, with neurogenic claudication 05/09/2014    Priority: High  . History of Focal seizure ([place]) 06/16/2013    Priority: High  . TIA (transient ischemic attack) 05/31/2013    Priority: High  . History of Large B-cell lymphoma ([place]) 02/18/2012    Priority: High  . Atrial fibrillation ([place]) 12/14/2008    Priority: High  . Agitation 03/11/2018    Priority: Medium  . History of chronic respiratory failure- prior on o2 06/15/2017    Priority: Medium  . Bacteremia due to Streptococcus 11/21/2015    Priority: Medium  . Cellulitis of leg, right 11/20/2015    Priority: Medium  . Diverticulitis of colon 09/26/2014    Priority: Medium  . CKD (chronic kidney disease), stage III ([place]) 09/26/2014    Priority: Medium  . Laryngeal carcinoma ([place])     Priority: Medium  . Anxiety state 10/20/2008    Priority: Medium  . Depression 10/20/2008    Priority: Medium  . Dyspnea on exertion 07/06/2008    Priority: Medium  . COPD GOLD II 03/26/2008    Priority: Medium  . Hyperlipidemia 03/18/2007    Priority: Medium  . Essential hypertension 03/18/2007    Priority: Medium  . Chronic diarrhea 09/04/2017    Priority: Low  . Aortic atherosclerosis ([place]) 06/30/2016    Priority: Low  . Weakness generalized 06/30/2013    Priority: Low  . Bilateral hand  pain 01/09/2013    Priority: Low  . Loose stools 06/01/2019  . Hemorrhoids 06/01/2019  . Chest pain 04/25/2018  . GERD (gastroesophageal reflux disease) 05/03/2007    Medications- reviewed and updated Current Outpatient Medications  Medication Sig Dispense Refill  . atorvastatin (LIPITOR) 20 MG tablet TAKE 1 TABLET BY MOUTH EVERY DAY 90 tablet 1  . bisoprolol (ZEBETA) 5 MG tablet TAKE 1 TABLET BY MOUTH EVERY DAY 90 tablet 1  . busPIRone ([name]) 5 MG tablet TAKE 1 TABLET BY MOUTH TWICE A DAY 180 tablet 3  . cholecalciferol (VITAMIN D) 1000 UNITS tablet Take 1,000 Units by mouth daily.    . Cyanocobalamin (VITAMIN B-12 PO) Take 1 tablet by mouth daily.    Marland Kitchen gabapentin (NEURONTIN) 100 MG capsule TAKE 1 CAPSULE BY MOUTH THREE TIMES A DAY 270 capsule 0  . hydrocortisone (ANUSOL-HC) 2.5 % rectal cream Place 1 application rectally at bedtime. 30 g 0  . ketoconazole (NIZORAL) 2 % cream Apply 1 application topically daily. 15 g 0  . levETIRAcetam (KEPPRA) 1000 MG tablet TAKE 1 TABLET BY MOUTH TWICE A DAY 180 tablet 1  . loperamide (IMODIUM) 2 MG capsule Take 2 mg by mouth daily as needed for diarrhea or loose stools.     Marland Kitchen omeprazole (PRILOSEC) 20 MG capsule Take 20 mg by mouth daily.    . Rivaroxaban (XARELTO) 15 MG TABS tablet TAKE 1 TABLET BY MOUTH  ONCE DAILY WITH SUPPER 30 tablet 11  . sertraline (ZOLOFT) 25 MG tablet TAKE 1 TABLET BY MOUTH EVERY DAY 90 tablet 0  . triamcinolone cream (KENALOG) 0.1 % Apply 1 application topically 2 (two) times daily. For 7-10 days maximum 80 g 0  . albuterol (VENTOLIN HFA) 108 (90 Base) MCG/ACT inhaler Inhale 2 puffs into the lungs every 6 (six) hours as needed for wheezing or shortness of breath. 18 g 1   No current facility-administered medications for this visit.      Objective:  BP (!) 104/58   Pulse 78   Temp 97.7 F (36.5 C)   Ht 5\' 4"  (1.626 m)   Wt 192 lb 12.8 oz (87.5 kg)   LMP  (LMP Unknown)   SpO2 93%   BMI 33.09 kg/m  Gen: NAD,  resting comfortably CV: RRR stable murmur. no rubs or gallops Lungs: CTAB no crackles, wheeze, rhonchi Abdomen: soft/nontender/nondistended/normal bowel sounds.  Ext: no edema Skin: warm, dry Neuro: CN II-XII intact, sensation and reflexes normal throughout, 5/5 muscle strength in bilateral upper and lower extremities. Normal finger to nose. Normal rapid alternating movements. No pronator drift. Walks with walker Pain with palpation of left posterior skull     Assessment and Plan  Posterior headache in area of trauma abouta year ago  S:Pt states she has a tender spot in the back of her head when she lays down and it causes her to lay down especially when she lays her head on the pillow. This has been going on for about a month on and off- has been bothering her for last month. Pain when laying on it up to 10/10. Pain level 3/10 otherwise. Tylenol was helpful.   Pain is in area that she fell and hit back of her head a year ago- states did not have ache or pain from that apparently but also apparently took a chunk out of the wall.  A/P: 85 year old female currently on  NOAC with history of atrial fibrillation presents with posterior headache in location of head trauma from a year ago primarily with palpation. Doubt bleed but need to rule out with head CT. If she has new or worsening symptoms before CT she will let us know.  - since pain is worse when laying on  Area- encouraged to try to sleep on her right side  # Coughing in patient with COPD  S:patient has had a cough for at least 3 months. Coughs up white phlegm. No sinus congestion.  Worse after eating. She describes as "choking cough"  A/P: Given duration of cough doubt covid 19. Will get chest x-ray with smoking history.   - with mainly occurring with eating- get speech and language therapy evaluation -can also try albuterol givenn known copd - history of larygneal carcinoma- if not improving could refer to ENT for their opinion  # HM  rectal bleeding on 05/17/2019 note- was to do stool cards with oncology - I encouraged her to submit these  Recommended follow up: as needed for acute issue  Future Appointments  Date Time Provider [place]  05/17/2020 11:30 AM Ladell Pier, MD [facility] None   Lab/Order associations:   ICD-10-CM   1. Nonintractable episodic headache, unspecified headache type  R51.9 CT Head Wo Contrast    CANCELED: CT Head Wo Contrast  2. Chronic cough  R05 DG Chest 2 View    Ambulatory referral to Home Health  3. COPD GOLD II  J44.9  Return precautions advised.  [name], MD

## 2019-06-06 NOTE — Patient Instructions (Addendum)
Stat head CT- team please let appropriate referral coordinator know.   We need to rule out a bleed- if we can rule that out we can feel better in just continuing to use tylenol as needed for this headache. If you have new or worsening symptoms please let us know.   For now can use tylenol as needed for pain   Try albuterol for cough  We will call you within two weeks about your referral to speech therapy- will have them come to your house. If you do not hear within 3 weeks, give [place] a call.   If cough not improving with speech therapy help- may need to get you back into ENT given laryngeal cancer history for their opinion.    Please stop by lab before you go If you do not have mychart- we will call you about results within 5 business days of [place] receiving them.  If you have mychart- we will send your results within 3 business days of [place] receiving them.  If abnormal or we want to clarify a result, we will call or mychart you to make sure you receive the message.  If you have questions or concerns or don't hear within 5-7 days, please send [place] a message or call us.

## 2019-06-06 NOTE — Addendum Note (Signed)
Addended by: Marin Olp on: 06/06/2019 11:02 AM   Modules accepted: Orders, Level of Service

## 2019-06-07 ENCOUNTER — Inpatient Hospital Stay: Admission: RE | Admit: 2019-06-07 | Payer: Medicare Other | Source: Ambulatory Visit

## 2019-06-13 ENCOUNTER — Inpatient Hospital Stay: Admission: RE | Admit: 2019-06-13 | Payer: Medicare Other | Source: Ambulatory Visit

## 2019-06-20 ENCOUNTER — Telehealth: Payer: Self-pay | Admitting: Family Medicine

## 2019-06-20 NOTE — Telephone Encounter (Signed)
Jarrett Soho calling from [facility] would like to move start of care to 06/21/19 for chronic cough. Please advise

## 2019-06-20 NOTE — Telephone Encounter (Signed)
See note

## 2019-06-20 NOTE — Telephone Encounter (Signed)
Ok to move start date?

## 2019-06-20 NOTE — Telephone Encounter (Signed)
Yes thanks 

## 2019-06-21 NOTE — Telephone Encounter (Signed)
Called and lm on [name] vm ok to move date.

## 2019-07-04 ENCOUNTER — Telehealth: Payer: Self-pay | Admitting: Family Medicine

## 2019-07-04 NOTE — Telephone Encounter (Signed)
Noted thanks °

## 2019-07-04 NOTE — Telephone Encounter (Signed)
See note

## 2019-07-04 NOTE — Telephone Encounter (Signed)
FYI

## 2019-07-04 NOTE — Telephone Encounter (Signed)
[place] with [facility] is  calling to let dr hunter know start of care will be delay. Pt is waiting on covid result.Ennis Forts will try to see pt on Monday 07-10-2019 if her result is neg

## 2019-07-06 ENCOUNTER — Other Ambulatory Visit: Payer: Self-pay | Admitting: Neurology

## 2019-07-06 NOTE — Telephone Encounter (Signed)
Requested Prescriptions   Pending Prescriptions Disp Refills  . levETIRAcetam (KEPPRA) 1000 MG tablet [Pharmacy Med Name: LEVETIRACETAM 1,000 MG TABLET] 180 tablet 1    Sig: TAKE 1 TABLET BY MOUTH TWICE A DAY   Rx last filled:10/24/18 #180 1 refills  Pt last seen:06/24/18  Follow up appt scheduled:none

## 2019-07-10 ENCOUNTER — Telehealth: Payer: Self-pay | Admitting: Family Medicine

## 2019-07-10 NOTE — Telephone Encounter (Signed)
See below

## 2019-07-10 NOTE — Telephone Encounter (Signed)
Returned [place] call and gave VO to continue therapy when pt is feeling better.

## 2019-07-10 NOTE — Telephone Encounter (Signed)
[name] [facility] [phone])   Per [name]: Pt refused speech therapy foe the second week this week due to illness. [name] would like to know if she should delay start of care date pr if she shoud d/c this order and request new V/O when pt is ready to begin therapy. Please advise.

## 2019-07-18 NOTE — Telephone Encounter (Signed)
Copied from [place] [id number]. Topic: General - Inquiry >> Jul 18, 2019 11:32 AM Alease Frame wrote: Reason for CRM: Lovey Newcomer from [facility] called to inform Dr Yong Channel that patients husband is refusing all speech therapy for patient and does not want to proceed.   Please advise  Call back number if needed   [phone]

## 2019-07-18 NOTE — Telephone Encounter (Signed)
Noted thanks °

## 2019-07-18 NOTE — Telephone Encounter (Signed)
FYI

## 2019-08-03 ENCOUNTER — Other Ambulatory Visit: Payer: Self-pay

## 2019-08-03 ENCOUNTER — Encounter: Payer: Self-pay | Admitting: Family Medicine

## 2019-08-03 ENCOUNTER — Ambulatory Visit (INDEPENDENT_AMBULATORY_CARE_PROVIDER_SITE_OTHER): Payer: Medicare Other

## 2019-08-03 ENCOUNTER — Ambulatory Visit (INDEPENDENT_AMBULATORY_CARE_PROVIDER_SITE_OTHER): Payer: Medicare Other | Admitting: Family Medicine

## 2019-08-03 VITALS — BP 118/60 | HR 79 | Temp 97.2°F | Ht 64.0 in | Wt 189.2 lb

## 2019-08-03 DIAGNOSIS — M25559 Pain in unspecified hip: Secondary | ICD-10-CM | POA: Diagnosis not present

## 2019-08-03 MED ORDER — PREDNISONE 50 MG PO TABS: ORAL_TABLET | ORAL | 0 refills | Status: DC

## 2019-08-03 NOTE — Progress Notes (Signed)
   Chief Complaint:  [name] is a 85 y.o. female who presents today with a chief complaint of left hip [name].   Assessment/Plan:  Left hip [name] Plain film with out any obvious fracture based on my read.  Will await radiology read.  Has degenerative changes and is likely experiencing a OA flare.  She has not a candidate for NSAIDs given cardiac and renal history.  Will start course of prednisone.  Also place referral to orthopedics.  Discussed reasons return to care.  Follow-up as needed.    Subjective:  HPI:  Hip [name]  Worsened within the last week. Hurt with movement. Progressively worsening. Located in left hip. Tried tylenol which has not helped. Hurts so much that it is hard for her to walk. [name] is sharp in nature. No falls or other obvious precipitating events.   ROS: Per HPI  PMH: She reports that she quit smoking about 34 years ago. Her smoking use included cigarettes. She has a 92.50 pack-year smoking history. She has never used smokeless tobacco. She reports that she does not drink alcohol or use drugs.      Objective:  Physical Exam: BP 118/60   Pulse 79   Temp (!) 97.2 F (36.2 C)   Ht 5\' 4"  (1.626 m)   Wt 189 lb 4 oz (85.8 kg)   LMP  (LMP Unknown)   SpO2 96%   BMI 32.48 kg/m   Gen: NAD, resting comfortably MSK:  Left hip: No deformities.  Tender to palpation along left inguinal crease.  Limited internal and external rotation due to [name].  Neurovascular intact distally Right hip: No deformities.  Limited internal/external rotation however increased range of motion compared to left.  Neurovascular intact distally.     [name]. Lisa Pain, MD 08/03/2019 11:39 AM

## 2019-08-03 NOTE — Patient Instructions (Signed)
It was very nice to see you today!  Please start the prednisone.  I will place a referral for you to see orthopedics.  Let me know or let Dr. Yong Channel know if your symptoms are not improving in the next 5 to 7 days.  Take care, Dr Jerline Pain  Please try these tips to maintain a healthy lifestyle:   Eat at least 3 REAL meals and 1-2 snacks per day.  Aim for no more than 5 hours between eating.  If you eat breakfast, please do so within one hour of getting up.    Each meal should contain half fruits/vegetables, one quarter protein, and one quarter carbs (no bigger than a computer mouse)   Cut down on sweet beverages. This includes juice, soda, and sweet tea.     Drink at least 1 glass of water with each meal and aim for at least 8 glasses per day   Exercise at least 150 minutes every week.

## 2019-08-07 ENCOUNTER — Telehealth: Payer: Self-pay | Admitting: Family Medicine

## 2019-08-07 NOTE — Progress Notes (Signed)
Please inform patient of the following:  Radiology reviewed her xray and found arthritis as we discussed. Would like for her to let us know if her symptoms are not improving and would like for her to follow up with orthopedics as we discussed.

## 2019-08-07 NOTE — Telephone Encounter (Signed)
Please alert me if headache does not resolve with conservative care within the 2-hour interval that was reported to patient

## 2019-08-07 NOTE — Telephone Encounter (Signed)
[facility] RECORD [facility] Patient Name: [name] Gender: Female DOB: 03-Aug-1928 Age: 86 Y 71 M 24 D Return Phone Number: [phone] (Primary) Address: City/State/Zip: [name] [address] Client Portal at [facility] [place] Client Site [name] Night Physician Dimas Chyle- MD Contact Type Call Who Is Calling Patient / Member / Family / Caregiver Call Type Triage / Clinical Caller Name [name] Relationship To Patient Spouse Return Phone Number [phone] (Primary) Chief Complaint Headache Reason for Call Symptomatic / Request for [place] states calling for his wife, she saw the doctor yesterday. This morning she woke up with terrible headache and dry heaves. He thinks it might be because of the prednisone. Translation No Nurse Assessment Nurse: Danford Bad, RN, [name] Date/Time (Eastern Time): 08/04/2019 10:46:11 AM Confirm and document reason for call. If symptomatic, describe symptoms. ---Caller states his wife she saw the doctor yesterday for hip pain. He did x-rays and gave her Prednisone for spurs in her hips. This morning she woke up with terrible headache and dry heaves. She is coughing. No fever, sore throat, runny nose. No cp or sob. She has normal diarrhea for her. No body aches. Has the patient had close contact with a person known or suspected to have the novel coronavirus illness OR traveled / lives in area with major community spread (including international travel) in the last 14 days from the onset of symptoms? * If Asymptomatic, screen for exposure and travel within the last 14 days. ---Yes Does the patient have any new or worsening symptoms? ---Yes Will a triage be completed? ---Yes Related visit to physician within the last 2 weeks? ---Yes Does the PT have any chronic conditions? (i.e.  diabetes, asthma, this includes High risk factors for pregnancy, etc.) ---Yes List chronic conditions. ---Low BP, Hip spurs, Afib Is this a behavioral health or substance abuse call? ---No Guidelines Guideline Title Affirmed Question Affirmed Notes Nurse Date/Time Eilene Ghazi Time) Headache Headache Danford Bad, RN, [name] 08/04/2019 10:53:51 AM PLEASE NOTE: All timestamps contained within this report are represented as [place] Standard Time. CONFIDENTIALTY NOTICE: This fax transmission is intended only for the addressee. It contains information that is legally privileged, confidential or otherwise protected from use or disclosure. If you are not the intended recipient, you are strictly prohibited from reviewing, disclosing, copying using or disseminating any of this information or taking any action in reliance on or regarding this information. If you have received this fax in error, please notify us immediately by telephone so that we can arrange for its return to [place]. Phone: [phone], Toll-Free: [phone], Fax: [phone] Page: 2 of 2 Call Id: [id number] [place]. Time Eilene Ghazi Time) Disposition Final User 08/04/2019 10:57:31 AM Home Care Yes Danford Bad, RN, Nell Range Disagree/Comply Comply Caller Understands Yes PreDisposition Did not know what to do Care Advice Given Per Guideline HOME CARE: * You should be able to treat this at home. REASSURANCE AND EDUCATION: It sounds like a painful headache, but there are pain medications you can take and other instructions I can give you to reduce the pain. CALL BACK IF: * Severe headache persists over 2 hours after pain medicine * Headache lasts over 24 hours despite using a pain medicine * You become worse. CARE ADVICE given per Headache (Adult) guideline. REST: Lie down in a dark quiet place and relax until feeling better.

## 2019-08-07 NOTE — Telephone Encounter (Signed)
Noted  

## 2019-08-07 NOTE — Telephone Encounter (Signed)
FYI

## 2019-08-15 ENCOUNTER — Ambulatory Visit: Payer: Medicare Other | Admitting: Orthopaedic Surgery

## 2019-08-17 ENCOUNTER — Ambulatory Visit: Payer: Medicare Other | Attending: Internal Medicine

## 2019-08-17 DIAGNOSIS — Z23 Encounter for immunization: Secondary | ICD-10-CM

## 2019-08-17 NOTE — Progress Notes (Addendum)
   Covid-19 Vaccination Clinic  Name:  [name]    MRN: [id number] DOB: 14-Jun-1928  08/17/2019  [name] was observed post Covid-19 immunization for 30 mins without incidence. She was provided with Vaccine Information Sheet and instruction to access the V-Safe system.   [name] was instructed to call 911 with any severe reactions post vaccine: Marland Kitchen Difficulty breathing  . Swelling of your face and throat  . A fast heartbeat  . A bad rash all over your body  . Dizziness and weakness    Immunizations Administered    Name Date Dose VIS Date Route   Pfizer COVID-19 Vaccine 08/17/2019  8:50 AM 0.3 mL 07/14/2019 Intramuscular   Manufacturer: [employer], [employer]   Lot: [id number]   [place]: [id number]

## 2019-08-18 ENCOUNTER — Ambulatory Visit: Payer: Medicare Other

## 2019-08-23 ENCOUNTER — Other Ambulatory Visit: Payer: Self-pay

## 2019-08-23 ENCOUNTER — Ambulatory Visit (INDEPENDENT_AMBULATORY_CARE_PROVIDER_SITE_OTHER): Payer: Medicare Other | Admitting: Orthopaedic Surgery

## 2019-08-23 ENCOUNTER — Other Ambulatory Visit: Payer: Self-pay | Admitting: Family Medicine

## 2019-08-23 ENCOUNTER — Encounter: Payer: Self-pay | Admitting: Orthopaedic Surgery

## 2019-08-23 ENCOUNTER — Ambulatory Visit: Payer: Self-pay

## 2019-08-23 DIAGNOSIS — M25552 Pain in left hip: Secondary | ICD-10-CM

## 2019-08-23 NOTE — Progress Notes (Signed)
Office Visit Note   Patient: [name]           Date of Birth: 26-Jul-1928           MRN: [id number] Visit Date: 08/23/2019              Requested by: Vivi Barrack, MD [address] [place],  [place] [address] PCP: Marin Olp, MD   Assessment & Plan: Visit Diagnoses:  1. Pain in left hip     Plan: I am concerned about the amount of pain she is experiencing with her left hip.  I do feel that she would benefit from some type of hopefully diagnostic or therapeutic steroid injection around her left hip joint itself or even the trochanteric area or both.  I would like to refer her to Dr. Junius Roads is a consultation for ultrasound-guided left hip injection.  We will try to set this up for today.  The patient is in agreement with this plan.  All question concerns were answered and addressed.  I can see her back myself in 2 weeks to see if this is helped.  Obviously if things worsen in any degree we would want to see her earlier.  Follow-Up Instructions: Return in about 2 weeks (around 09/06/2019).   Orders:  No orders of the defined types were placed in this encounter.  No orders of the defined types were placed in this encounter.     Procedures: No procedures performed   Clinical Data: No additional findings.   Subjective: Chief Complaint  Patient presents with  . Left Hip - Pain  The patient comes in today for evaluation treatment of left hip pain.  It has been hurting for about a month.  She ambulates very slowly using a rolling walker.  She points to the groin area but also the lateral aspect of her left hip as the source of her pain.  She denies any injuries but has had worsening difficulty with getting in and out of bed.  She does have a history of bilateral knee surgeries.  This was done remotely.  Her husband is with her today.  She does report pain that is detrimentally affecting her mobility, her actives daily living, and her quality of life.  She is on  Xarelto.  HPI  Review of Systems She currently denies any headache or shortness of breath.  She denies any fever, chills, nausea, vomiting  Objective: Vital Signs: LMP  (LMP Unknown)   Physical Exam She is alert and oriented x3 and in no acute distress Ortho Exam Examination of her right hip is entirely normal.  Examination of her left hip shows a lot of guarding when I try to put her hip through rotation.  Her leg lengths are equal.  Her pain seems to be all around the left hip including trochanteric area and the joint itself. Specialty Comments:  No specialty comments available.  Imaging: No results found. X-rays independently reviewed of her pelvis and left hip that were done on December 31 showed no gross deformities.  The joint space is well-maintained with minimal arthritic findings.  There is no evidence of fracture or acute findings.  PMFS History: Patient Active Problem List   Diagnosis Date Noted  . Loose stools 06/01/2019  . Hemorrhoids 06/01/2019  . Chest pain 04/25/2018  . Agitation 03/11/2018  . Chronic diarrhea 09/04/2017  . History of chronic respiratory failure- prior on o2 06/15/2017  . Aortic atherosclerosis ([place]) 06/30/2016  .  Diastolic CHF ([place]) [id number]  . Bacteremia due to Streptococcus 11/21/2015  . Cellulitis of leg, right 11/20/2015  . History of cardioembolic stroke [id number]  . Diverticulitis of colon 09/26/2014  . CKD (chronic kidney disease), stage III ([place]) 09/26/2014  . Laryngeal carcinoma ([place])   . Spinal stenosis, lumbar region, with neurogenic claudication 05/09/2014  . Weakness generalized 06/30/2013  . History of Focal seizure ([place]) 06/16/2013  . TIA (transient ischemic attack) 05/31/2013  . Bilateral hand pain 01/09/2013  . History of Large B-cell lymphoma ([place]) 02/18/2012  . Atrial fibrillation ([place]) 12/14/2008  . Anxiety state 10/20/2008  . Depression 10/20/2008  . Dyspnea on exertion 07/06/2008  . COPD GOLD II 03/26/2008  .  GERD (gastroesophageal reflux disease) 05/03/2007  . Hyperlipidemia 03/18/2007  . Essential hypertension 03/18/2007   Past Medical History:  Diagnosis Date  . ABDOMINAL INCISIONAL HERNIA 04/03/2010      . Anxiety   . Arthritis    "lots; hands" (05/26/2013)  . Blood transfusion    "w/1st miscarriage" (05/26/2013)  . COPD (chronic obstructive pulmonary disease) ([place])   . Coronary artery disease   . Depression   . Diverticulitis of colon with hemorrhage 07/24/2008  . DVT (deep venous thrombosis) ([place]) 1954   "after I had my first child" (05/26/2013)  . Exertional shortness of breath   . Family history of anesthesia complication    "sisters also get sick as a dog" (05/26/2013)  . GERD 05/03/2007      . GERD (gastroesophageal reflux disease)   . H/O hiatal hernia   . HCAP (healthcare-associated pneumonia) 06/26/2013  . Heart murmur    "when I was a child" (05/26/2013)  . History of TIAs   . Hyperlipidemia   . Hypertension   . Laryngeal carcinoma ([place]) hx of ca   remotely with resection and xrt/chronic hoarsemenss  . Lymphona, mantle cell, inguinal region/lower limb ([place])    "large c-cell" (05/26/2013)  . OSTEOARTHRITIS 05/03/2007  . PEPTIC ULCER DISEASE 07/06/2008  . Pneumonia 2013   "once" (05/26/2013)  . PUD (peptic ulcer disease)   . PVD (peripheral vascular disease) ([place])   . Seizures ([place])   . Stroke ([place]) 05/26/2013   "very mild; affected my speech for a while" (05/26/2013)  . VARICOSE VEINS LOWER EXTREMITIES W/INFLAMMATION 01/15/2009    Family History  Problem Relation Age of Onset  . Colon cancer Sister        colon  . Hyperlipidemia Mother   . Stroke Mother   . Cancer Father        mesothelioma    Past Surgical History:  Procedure Laterality Date  . ABDOMINAL EXPLORATION SURGERY  12/01/2012  . APPENDECTOMY  09/13/2008  . CAROTID ENDARTERECTOMY Bilateral 2004   2004 a month apart right and left  . CARPAL TUNNEL RELEASE Left ?1960's  . CATARACT EXTRACTION  2005    bilateral  . COLON SURGERY  09/13/2008   Laparoscopic assisted converted to open sigmoid colectomy.Archie Endo 09/13/2008 (05/26/2013)  . DILATION AND CURETTAGE OF UTERUS  1950's   "had 2 for miscarriages" (05/26/2013)  . DIVERTING ILEOSTOMY  09/22/2008   iliostomy for diverticulitis/notes 09/22/2008 (05/26/2013)  . HERNIA REPAIR  05/2010   ventral hernia repair  . ILEOSTOMY CLOSURE  01/2010  . RADICAL NECK DISSECTION  1986   "larynx cancer" (05/26/2013)  . TONSILLECTOMY AND ADENOIDECTOMY  1936   "tonsils grew back; no 2nd OR" (05/26/2013)  . TOTAL KNEE ARTHROPLASTY Bilateral    2002 left 2008 right  . TUBAL  LIGATION  1972   Social History   Occupational History  . Occupation: retired    Fish farm manager: RETIRED  Tobacco Use  . Smoking status: Former Smoker    Packs/day: 2.50    Years: 37.00    Pack years: 92.50    Types: Cigarettes    Quit date: 09/05/1984    Years since quitting: 34.9  . Smokeless tobacco: Never Used  . Tobacco comment: quit when she was dx with cancer  Substance and Sexual Activity  . Alcohol use: No    Alcohol/week: 0.0 standard drinks  . Drug use: No  . Sexual activity: Not Currently

## 2019-08-23 NOTE — Progress Notes (Signed)
Subjective: Patient is here for ultrasound-guided intra-articular left hip injection.   Severe pain with hip movements.  Objective:  Very painful with any movement.  Procedure: Ultrasound-guided left hip injection: After sterile prep with Betadine, injected 8 cc 1% lidocaine without epinephrine and 40 mg methylprednisolone using a 22-gauge spinal needle, passing the needle through the iliofemoral ligament into the femoral head/neck junction.  Injectate seen filling joint capsule.  Very good immediate relief.

## 2019-09-04 ENCOUNTER — Ambulatory Visit: Payer: Medicare Other | Attending: Internal Medicine

## 2019-09-04 DIAGNOSIS — Z23 Encounter for immunization: Secondary | ICD-10-CM

## 2019-09-04 NOTE — Progress Notes (Signed)
   Covid-19 Vaccination Clinic  Name:  TALEN DERICO    MRN: [id number] DOB: 1928/03/13  09/04/2019  Ms. Perisho was observed post Covid-19 immunization for 15 minutes without incidence. She was provided with Vaccine Information Sheet and instruction to access the V-Safe system.   Ms. Hedge was instructed to call 911 with any severe reactions post vaccine: Marland Kitchen Difficulty breathing  . Swelling of your face and throat  . A fast heartbeat  . A bad rash all over your body  . Dizziness and weakness    Immunizations Administered    Name Date Dose VIS Date Route   Pfizer COVID-19 Vaccine 09/04/2019  8:28 AM 0.3 mL 07/14/2019 Intramuscular   Manufacturer: [place]   Lot: [id number]   [place]: [id number]

## 2019-09-28 ENCOUNTER — Other Ambulatory Visit: Payer: Self-pay | Admitting: Neurology

## 2019-09-28 ENCOUNTER — Other Ambulatory Visit: Payer: Self-pay | Admitting: Family Medicine

## 2019-10-04 ENCOUNTER — Other Ambulatory Visit: Payer: Self-pay | Admitting: Family Medicine

## 2019-10-16 ENCOUNTER — Telehealth: Payer: Self-pay | Admitting: Family Medicine

## 2019-10-16 ENCOUNTER — Ambulatory Visit: Payer: Medicare Other | Admitting: Family Medicine

## 2019-10-16 ENCOUNTER — Encounter: Payer: Self-pay | Admitting: Family Medicine

## 2019-10-16 ENCOUNTER — Other Ambulatory Visit: Payer: Self-pay

## 2019-10-16 VITALS — BP 122/70 | HR 91 | Temp 97.5°F | Ht 64.0 in | Wt 196.6 lb

## 2019-10-16 DIAGNOSIS — F3342 Major depressive disorder, recurrent, in full remission: Secondary | ICD-10-CM | POA: Insufficient documentation

## 2019-10-16 DIAGNOSIS — I480 Paroxysmal atrial fibrillation: Secondary | ICD-10-CM

## 2019-10-16 DIAGNOSIS — G40109 Localization-related (focal) (partial) symptomatic epilepsy and epileptic syndromes with simple partial seizures, not intractable, without status epilepticus: Secondary | ICD-10-CM

## 2019-10-16 DIAGNOSIS — I5030 Unspecified diastolic (congestive) heart failure: Secondary | ICD-10-CM | POA: Diagnosis not present

## 2019-10-16 DIAGNOSIS — R21 Rash and other nonspecific skin eruption: Secondary | ICD-10-CM

## 2019-10-16 DIAGNOSIS — J449 Chronic obstructive pulmonary disease, unspecified: Secondary | ICD-10-CM

## 2019-10-16 DIAGNOSIS — I10 Essential (primary) hypertension: Secondary | ICD-10-CM | POA: Diagnosis not present

## 2019-10-16 DIAGNOSIS — E7800 Pure hypercholesterolemia, unspecified: Secondary | ICD-10-CM

## 2019-10-16 DIAGNOSIS — L989 Disorder of the skin and subcutaneous tissue, unspecified: Secondary | ICD-10-CM

## 2019-10-16 DIAGNOSIS — I700 Atherosclerosis of aorta: Secondary | ICD-10-CM

## 2019-10-16 DIAGNOSIS — R451 Restlessness and agitation: Secondary | ICD-10-CM

## 2019-10-16 NOTE — Telephone Encounter (Signed)
Patient calls in this morning requesting an appointment as she has noticed that her varicose vein has popped in her foot, is it okay to use Same Day slot for tomorrow?

## 2019-10-16 NOTE — Assessment & Plan Note (Signed)
PHQ-9 under 5 on sertraline 25 mg.  Patient was somewhat more tired during the daytime on buspirone twice a day and is now back to once a day.  Reasonable control-continue current medication

## 2019-10-16 NOTE — Patient Instructions (Addendum)
Lets try lasix Tuesday and Thursday of this week and then as needed for increased swelling up to twice a week- if needing more than that please let me know. Indicators of needing more lasix- weight gain such as 3 lbs in a day or 5 lbs in 3 days, increased swelling, shortness of breath  No other changes today.   Please stop by lab before you go If you do not have mychart- we will call you about results within 5 business days of [place] receiving them.  If you have mychart- we will send your results within 3 business days of [place] receiving them.  If abnormal or we want to clarify a result, we will call or mychart you to make sure you receive the message.  If you have questions or concerns or don't hear within 5 business days, please send [place] a message or call us.     Recommended follow up: Return in about 4 months (around 02/15/2020) for physical or sooner if needed.

## 2019-10-16 NOTE — Assessment & Plan Note (Signed)
Rate controlled on bisoprolol.  Appropriately anticoagulated with Xarelto.  Well-controlled-continue current medications

## 2019-10-16 NOTE — Telephone Encounter (Signed)
Chief Complaint Foot Pain Reason for Call Symptomatic / Request for Health Information Initial Comment Caller states the pain has a ruptured vein in her foot. It's painful and tightness in foot. Translation No Nurse Assessment Nurse: Neena Rhymes, RN, Sharyn Lull Date/Time (Eastern Time): 10/16/2019 9:55:32 AM Confirm and document reason for call. If symptomatic, describe symptoms. ---Caller states she has 2 veins that popped out one on the right ankle about 1 inch, and one on her shin. Denies injury or active bleeding.. Pain on ankle this am. Ache under left axilla Was limping from ankle pain earlier but not currently, Has the patient had close contact with a person known or suspected to have the novel coronavirus illness OR traveled / lives in area with major community spread (including international travel) in the last 14 days from the onset of symptoms? * If Asymptomatic, screen for exposure and travel within the last 14 days. ---No Does the patient have any new or worsening symptoms? ---Yes Will a triage be completed? ---Yes Related visit to physician within the last 2 weeks? ---No Does the PT have any chronic conditions? (i.e. diabetes, asthma, this includes High risk factors for pregnancy, etc.) ---Yes List chronic conditions. ---HBP, on blood thinner, hx of CVA, Is this a behavioral health or substance abuse call? ---No

## 2019-10-16 NOTE — Telephone Encounter (Signed)
Yes or for app to day if we have something open see if she is having any red work symptoms. That need to be addressed sooner I tried to call but did not get answer.

## 2019-10-16 NOTE — Progress Notes (Signed)
Phone [phone] In person visit   Subjective:   [name] is a 86 y.o. year old very pleasant female patient who presents for/with See problem oriented charting Chief Complaint  Patient presents with  . Varicose Veins   This visit occurred during the SARS-CoV-2 public health emergency.  Safety protocols were in place, including screening questions prior to the visit, additional usage of staff PPE, and extensive cleaning of exam room while observing appropriate contact time as indicated for disinfecting solutions.   Past Medical History-  Patient Active Problem List   Diagnosis Date Noted  . Diastolic CHF ([place]) [id number]    Priority: High  . History of cardioembolic stroke [id number]    Priority: High  . Spinal stenosis, lumbar region, with neurogenic claudication 05/09/2014    Priority: High  . History of Focal seizure ([place]) 06/16/2013    Priority: High  . History of TIA (transient ischemic attack) 05/31/2013    Priority: High  . History of Large B-cell lymphoma ([place]) 02/18/2012    Priority: High  . Atrial fibrillation ([place]) 12/14/2008    Priority: High  . Agitation 03/11/2018    Priority: Medium  . History of chronic respiratory failure- prior on o2 06/15/2017    Priority: Medium  . Bacteremia due to Streptococcus 11/21/2015    Priority: Medium  . Cellulitis of leg, right 11/20/2015    Priority: Medium  . Diverticulitis of colon 09/26/2014    Priority: Medium  . CKD (chronic kidney disease), stage III ([place]) 09/26/2014    Priority: Medium  . Laryngeal carcinoma ([place])     Priority: Medium  . Anxiety state 10/20/2008    Priority: Medium  . Depression 10/20/2008    Priority: Medium  . Dyspnea on exertion 07/06/2008    Priority: Medium  . COPD GOLD II 03/26/2008    Priority: Medium  . Hyperlipidemia 03/18/2007    Priority: Medium  . Essential hypertension 03/18/2007    Priority: Medium  . Chronic diarrhea 09/04/2017    Priority: Low  . Aortic  atherosclerosis ([place]) 06/30/2016    Priority: Low  . Weakness generalized 06/30/2013    Priority: Low  . Bilateral hand pain 01/09/2013    Priority: Low  . Recurrent major depressive disorder, in full remission ([place]) 10/16/2019  . Loose stools 06/01/2019  . Hemorrhoids 06/01/2019  . Chest pain 04/25/2018  . GERD (gastroesophageal reflux disease) 05/03/2007    Medications- reviewed and updated Current Outpatient Medications  Medication Sig Dispense Refill  . albuterol (VENTOLIN HFA) 108 (90 Base) MCG/ACT inhaler TAKE 2 PUFFS BY MOUTH EVERY 6 HOURS AS NEEDED FOR WHEEZE OR SHORTNESS OF BREATH 18 g 1  . atorvastatin (LIPITOR) 20 MG tablet TAKE 1 TABLET BY MOUTH EVERY DAY 90 tablet 1  . bisoprolol (ZEBETA) 5 MG tablet TAKE 1 TABLET BY MOUTH EVERY DAY 90 tablet 1  . busPIRone ([name]) 5 MG tablet TAKE 1 TABLET BY MOUTH TWICE A DAY (Patient taking differently: Take 5 mg by mouth daily. ) 180 tablet 3  . cholecalciferol (VITAMIN D) 1000 UNITS tablet Take 1,000 Units by mouth daily.    . Cyanocobalamin (VITAMIN B-12 PO) Take 1 tablet by mouth daily.    Marland Kitchen gabapentin (NEURONTIN) 100 MG capsule TAKE 1 CAPSULE BY MOUTH THREE TIMES A DAY (Patient taking differently: Take 100 mg by mouth 2 (two) times daily. ) 270 capsule 0  . hydrocortisone (ANUSOL-HC) 2.5 % rectal cream Place 1 application rectally at bedtime. 30 g 0  . ketoconazole (NIZORAL)  2 % cream Apply 1 application topically daily. 15 g 0  . levETIRAcetam (KEPPRA) 1000 MG tablet TAKE 1 TABLET BY MOUTH TWICE A DAY 180 tablet 0  . loperamide (IMODIUM) 2 MG capsule Take 2 mg by mouth daily as needed for diarrhea or loose stools.     Marland Kitchen omeprazole (PRILOSEC) 20 MG capsule Take 20 mg by mouth daily as needed.     . sertraline (ZOLOFT) 25 MG tablet TAKE 1 TABLET BY MOUTH EVERY DAY 90 tablet 0  . triamcinolone cream (KENALOG) 0.1 % Apply 1 application topically 2 (two) times daily. For 7-10 days maximum 80 g 0  . XARELTO 15 MG TABS tablet TAKE 1  TABLET BY MOUTH EVERY DAY WITH SUPPER 30 tablet 10   No current facility-administered medications for this visit.     Objective:  BP 122/70   Pulse 91   Temp (!) 97.5 F (36.4 C) (Temporal)   Ht 5\' 4"  (1.626 m)   Wt 196 lb 9.6 oz (89.2 kg)   LMP  (LMP Unknown)   SpO2 90%   BMI 33.75 kg/m  Gen: NAD, resting comfortably CV: RRR no murmurs rubs or gallops Lungs: CTAB no crackles, wheeze, rhonchi Ext: Trace edema Skin: warm, dry, with erythematous patches along neckline-appear almost to be bruises.  On left hand as erythematous patch at least 3 x 2 cm with a scaling surface Neuro: Walks with walker, appears tangential at times but this is consistent with her baseline baseline    Assessment and Plan   # Rash #hand skin lesion S: deep erythematous rash on her neck- present for at least a year. Perhaps mildly getting better but she is concerned because has not resolved  Also has erythematous scaly patch on left hand.   A/P: left hand possible eczematous vs. Psoriatic patch- will refer to dermatology. Triamcinolone has not helped much.   Rash on chest- unclear etiology- refer to dermatology.    # Diastolic CHF S:patient has noted slight increased swelling in her legs. Has some lasix on hand but hasnt ued. Weight up 7 lbs from last visit. No shortness of breath A/P: I think with slight increased swelling in the legs and weight being up some its reasonable to take lasix on Tuesday and Thursday morning to try to get some of the extra fluid off. In the future keep an eye on swelling and can take lasix once or twice a week as needed for swelling- if taking it more than that let me know and we will likely need to check on your potassium and kidneys.    #hyperlipidemia/history of TIA/Aortic atherosclerosis ([place]), Chronic S: compliant with atorvastatin 20 mg daily  Aortic atherosclerosis noted on prior imaging on chest x-ray November 2017 Lab Results  Component Value Date   CHOL 96  04/20/2019   HDL 30.80 (L) 04/20/2019   LDLCALC 90 08/07/2014   LDLDIRECT 40.0 04/20/2019   TRIG 266.0 (H) 04/20/2019   CHOLHDL 3 04/20/2019   A/P: Last LDL excellent at 40 on atorvastatin 10 mg daily-well-controlled-continue current medication.  For aortic atherosclerosis-continue risk factor modification  #hypertension S: compliant with bisoprolol 5 mg BP Readings from Last 3 Encounters:  10/16/19 122/70  08/03/19 118/60  06/06/19 (!) 104/58  A/P:  Stable. Continue current medications.    # Varicose Vein  S: Patient mentioned that she noticed her varicose vein had popped. No pain or discomfort. No redness at present.   A/P: Noted on right lower leg-appears to be healing  well  Agitation/Recurrent major depressive disorder, in full remission ([place]) PHQ-9 under 5 on sertraline 25 mg.  Patient was somewhat more tired during the daytime on buspirone twice a day and is now back to once a day.  Reasonable control-continue current medication  Atrial fibrillation ([facility]) Rate controlled on bisoprolol.  Appropriately anticoagulated with Xarelto.  Well-controlled-continue current medications  COPD GOLD II Patient is rather sedentary.  She uses albuterol still about twice a day most days and finds this helpful.  COPD does not seem to be limiting patient as she is already sedentary-we will continue albuterol.  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  Well-controlled  Recommended follow up: Return in about 4 months (around 02/15/2020) for physical or sooner if needed. Future Appointments  Date Time Provider [place]  05/17/2020 11:30 AM Ladell Pier, MD [facility] None    Lab/Order associations:   ICD-10-CM   1. Rash  R21 Ambulatory referral to Dermatology  2. Diastolic congestive heart failure, unspecified HF chronicity (HCC)   I50.30   3. Paroxysmal atrial fibrillation (HCC)  I48.0   4. COPD GOLD II  J44.9   5. Recurrent major depressive disorder, in full remission ([place])  F33.42   6. Aortic atherosclerosis (HCC) Chronic I70.0   7. 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 (HCC) Chronic G40.109   8. Agitation  R45.1   9. Pure hypercholesterolemia  E78.00   10. Essential hypertension  I10 CBC with Differential/Platelet    Comprehensive metabolic panel  11. Skin lesion of hand  L98.9 Ambulatory referral to Dermatology   Return precautions advised.  [name], MD

## 2019-10-16 NOTE — Assessment & Plan Note (Signed)
Patient is rather sedentary.  She uses albuterol still about twice a day most days and finds this helpful.  COPD does not seem to be limiting patient as she is already sedentary-we will continue albuterol.  Could consider other therapy like Spiriva

## 2019-10-16 NOTE — Telephone Encounter (Signed)
Patient seen- denied pain with lesion- appeared to be healing well

## 2019-10-16 NOTE — Telephone Encounter (Signed)
FYI patient seen today

## 2019-10-17 LAB — CBC WITH DIFFERENTIAL/PLATELET
Basophils Absolute: 0.1 10*3/uL (ref 0.0–0.1)
Basophils Relative: 1.1 % (ref 0.0–3.0)
Eosinophils Absolute: 0.2 10*3/uL (ref 0.0–0.7)
Eosinophils Relative: 2.5 % (ref 0.0–5.0)
HCT: 36.9 % (ref 36.0–46.0)
Hemoglobin: 11.9 g/dL — ABNORMAL LOW (ref 12.0–15.0)
Lymphocytes Relative: 23.7 % (ref 12.0–46.0)
Lymphs Abs: 1.5 10*3/uL (ref 0.7–4.0)
MCHC: 32.3 g/dL (ref 30.0–36.0)
MCV: 88.3 fl (ref 78.0–100.0)
Monocytes Absolute: 0.5 10*3/uL (ref 0.1–1.0)
Monocytes Relative: 7.8 % (ref 3.0–12.0)
Neutro Abs: 4 10*3/uL (ref 1.4–7.7)
Neutrophils Relative %: 64.9 % (ref 43.0–77.0)
Platelets: 214 10*3/uL (ref 150.0–400.0)
RBC: 4.18 Mil/uL (ref 3.87–5.11)
RDW: 15.5 % (ref 11.5–15.5)
WBC: 6.2 10*3/uL (ref 4.0–10.5)

## 2019-10-17 LAB — COMPREHENSIVE METABOLIC PANEL
ALT: 13 U/L (ref 0–35)
AST: 13 U/L (ref 0–37)
Albumin: 3.8 g/dL (ref 3.5–5.2)
Alkaline Phosphatase: 81 U/L (ref 39–117)
BUN: 16 mg/dL (ref 6–23)
CO2: 31 mEq/L (ref 19–32)
Calcium: 9.5 mg/dL (ref 8.4–10.5)
Chloride: 102 mEq/L (ref 96–112)
Creatinine, Ser: 1.03 mg/dL (ref 0.40–1.20)
GFR: 50.39 mL/min — ABNORMAL LOW (ref >60.00)
Glucose, Bld: 119 mg/dL — ABNORMAL HIGH (ref 70–99)
Potassium: 4.3 mEq/L (ref 3.5–5.1)
Sodium: 140 mEq/L (ref 135–145)
Total Bilirubin: 0.5 mg/dL (ref 0.2–1.2)
Total Protein: 6.9 g/dL (ref 6.0–8.3)

## 2019-10-18 ENCOUNTER — Other Ambulatory Visit: Payer: Self-pay | Admitting: Family Medicine

## 2019-11-23 ENCOUNTER — Other Ambulatory Visit: Payer: Self-pay | Admitting: Family Medicine

## 2019-11-23 ENCOUNTER — Ambulatory Visit: Payer: Medicare Other | Admitting: Cardiovascular Disease

## 2019-12-06 ENCOUNTER — Other Ambulatory Visit: Payer: Self-pay | Admitting: Family Medicine

## 2019-12-13 ENCOUNTER — Emergency Department (HOSPITAL_COMMUNITY): Payer: Medicare Other

## 2019-12-13 ENCOUNTER — Emergency Department (HOSPITAL_COMMUNITY)
Admission: EM | Admit: 2019-12-13 | Discharge: 2019-12-13 | Disposition: A | Discharge status: Home / Self Care | Payer: Medicare Other | Attending: Emergency Medicine | Admitting: Emergency Medicine

## 2019-12-13 DIAGNOSIS — Z96653 Presence of artificial knee joint, bilateral: Secondary | ICD-10-CM | POA: Diagnosis not present

## 2019-12-13 DIAGNOSIS — Z79899 Other long term (current) drug therapy: Secondary | ICD-10-CM | POA: Diagnosis not present

## 2019-12-13 DIAGNOSIS — I5030 Unspecified diastolic (congestive) heart failure: Secondary | ICD-10-CM | POA: Diagnosis not present

## 2019-12-13 DIAGNOSIS — J449 Chronic obstructive pulmonary disease, unspecified: Secondary | ICD-10-CM | POA: Diagnosis not present

## 2019-12-13 DIAGNOSIS — R079 Chest pain, unspecified: Secondary | ICD-10-CM | POA: Diagnosis present

## 2019-12-13 DIAGNOSIS — Z87891 Personal history of nicotine dependence: Secondary | ICD-10-CM | POA: Diagnosis not present

## 2019-12-13 DIAGNOSIS — Z8673 Personal history of transient ischemic attack (TIA), and cerebral infarction without residual deficits: Secondary | ICD-10-CM | POA: Diagnosis not present

## 2019-12-13 DIAGNOSIS — Z8572 Personal history of non-Hodgkin lymphomas: Secondary | ICD-10-CM | POA: Diagnosis not present

## 2019-12-13 DIAGNOSIS — N183 Chronic kidney disease, stage 3 unspecified: Secondary | ICD-10-CM | POA: Diagnosis not present

## 2019-12-13 DIAGNOSIS — I130 Hypertensive heart and chronic kidney disease with heart failure and stage 1 through stage 4 chronic kidney disease, or unspecified chronic kidney disease: Secondary | ICD-10-CM | POA: Insufficient documentation

## 2019-12-13 DIAGNOSIS — J40 Bronchitis, not specified as acute or chronic: Secondary | ICD-10-CM | POA: Insufficient documentation

## 2019-12-13 DIAGNOSIS — I2510 Atherosclerotic heart disease of native coronary artery without angina pectoris: Secondary | ICD-10-CM | POA: Diagnosis not present

## 2019-12-13 DIAGNOSIS — Z7901 Long term (current) use of anticoagulants: Secondary | ICD-10-CM | POA: Insufficient documentation

## 2019-12-13 DIAGNOSIS — Z8521 Personal history of malignant neoplasm of larynx: Secondary | ICD-10-CM | POA: Diagnosis not present

## 2019-12-13 LAB — BASIC METABOLIC PANEL
Anion gap: 7 (ref 5–15)
BUN: 15 mg/dL (ref 8–23)
CO2: 31 mmol/L (ref 22–32)
Calcium: 9.9 mg/dL (ref 8.9–10.3)
Chloride: 103 mmol/L (ref 98–111)
Creatinine, Ser: 1.06 mg/dL — ABNORMAL HIGH (ref 0.44–1.00)
GFR calc Af Amer: 54 mL/min — ABNORMAL LOW (ref >60)
GFR calc non Af Amer: 47 mL/min — ABNORMAL LOW (ref >60)
Glucose, Bld: 106 mg/dL — ABNORMAL HIGH (ref 70–99)
Potassium: 5 mmol/L (ref 3.5–5.1)
Sodium: 141 mmol/L (ref 135–145)

## 2019-12-13 LAB — CBC
HCT: 42.3 % (ref 36.0–46.0)
Hemoglobin: 12.6 g/dL (ref 12.0–15.0)
MCH: 27.8 pg (ref 26.0–34.0)
MCHC: 29.8 g/dL — ABNORMAL LOW (ref 30.0–36.0)
MCV: 93.4 fL (ref 80.0–100.0)
Platelets: 165 10*3/uL (ref 150–400)
RBC: 4.53 MIL/uL (ref 3.87–5.11)
RDW: 13.8 % (ref 11.5–15.5)
WBC: 5.5 10*3/uL (ref 4.0–10.5)
nRBC: 0 % (ref 0.0–0.2)

## 2019-12-13 LAB — LACTIC ACID, PLASMA: Lactic Acid, Venous: 0.7 mmol/L (ref 0.5–1.9)

## 2019-12-13 LAB — TROPONIN I (HIGH SENSITIVITY)
Troponin I (High Sensitivity): 5 ng/L (ref <18)
Troponin I (High Sensitivity): 5 ng/L (ref <18)

## 2019-12-13 MED ORDER — SODIUM CHLORIDE 0.9 % IV SOLN: 500.0000 mg | Freq: Once | INTRAVENOUS | Status: DC

## 2019-12-13 MED ORDER — SODIUM CHLORIDE 0.9% FLUSH: 3.0000 mL | Freq: Once | INTRAVENOUS | Status: DC

## 2019-12-13 MED ORDER — LEVOFLOXACIN 250 MG PO TABS
250.0000 mg | ORAL_TABLET | Freq: Every day | ORAL | 0 refills | Status: DC
Start: 2019-12-13 — End: 2020-04-12

## 2019-12-13 MED ORDER — SODIUM CHLORIDE 0.9 % IV SOLN: 1.0000 g | Freq: Once | INTRAVENOUS | Status: DC

## 2019-12-13 MED ORDER — LEVOFLOXACIN 500 MG PO TABS
500.0000 mg | ORAL_TABLET | Freq: Once | ORAL | Status: DC
  Filled 2019-12-13: qty 1

## 2019-12-13 NOTE — ED Notes (Signed)
Pt refusing to be placed on Purewick or in and out cath for UA. Pt also refusing second stick for lab work. MD informed

## 2019-12-13 NOTE — ED Provider Notes (Signed)
[facility] EMERGENCY DEPARTMENT Provider Note   CSN: [id number] Arrival date & time: 12/13/19  1437     History Chief Complaint  Patient presents with  . Chest Pain    [name] is a 86 y.o. female.  HPI She presents for evaluation of chest pain, that started today.  She is a somewhat vague historian and has difficulty expressing herself fully.  The pain is intermittent, but she cannot specify how long it lasts.  Occasionally, she coughs and produces white sputum.  She came by EMS from her home where she lives with her husband.  During transport, on room air her oxygenation was 90%.  The patient denies other recent illnesses.  She has chronic difficulty with stooling, because of a prior bowel resection.  She cannot specify exactly what kind of problems she has with her bowel movements.  She denies dysuria or urinary frequency.  She states she did not eat today because "I did not have time."  She apparently took her medications today.  There are no other known modifying factors.    Past Medical History:  Diagnosis Date  . ABDOMINAL INCISIONAL HERNIA 04/03/2010      . Anxiety   . Arthritis    "lots; hands" (05/26/2013)  . Blood transfusion    "w/1st miscarriage" (05/26/2013)  . COPD (chronic obstructive pulmonary disease) ([place])   . Coronary artery disease   . Depression   . Diverticulitis of colon with hemorrhage 07/24/2008  . DVT (deep venous thrombosis) ([place]) 1954   "after I had my first child" (05/26/2013)  . Exertional shortness of breath   . Family history of anesthesia complication    "sisters also get sick as a dog" (05/26/2013)  . GERD 05/03/2007      . GERD (gastroesophageal reflux disease)   . H/O hiatal hernia   . HCAP (healthcare-associated pneumonia) 06/26/2013  . Heart murmur    "when I was a child" (05/26/2013)  . History of TIAs   . Hyperlipidemia   . Hypertension   . Keratotic plaque   . 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 Lymphona, mantle cell, inguinal region/lower limb ([place])    "large c-cell" (05/26/2013)  . OSTEOARTHRITIS 05/03/2007  . PEPTIC ULCER DISEASE 07/06/2008  . Pneumonia 2013   "once" (05/26/2013)  . PUD (peptic ulcer disease)   . PVD (peripheral vascular disease) ([place])   . Seizures ([place])   . Stroke ([place]) 05/26/2013   "very mild; affected my speech for a while" (05/26/2013)  . VARICOSE VEINS LOWER EXTREMITIES W/INFLAMMATION 01/15/2009    Patient Active Problem List   Diagnosis Date Noted  . Recurrent major depressive disorder, in full remission ([place]) 10/16/2019  . Loose stools 06/01/2019  . Hemorrhoids 06/01/2019  . Chest pain 04/25/2018  . Agitation 03/11/2018  . Chronic diarrhea 09/04/2017  . History of chronic respiratory failure- prior on o2 06/15/2017  . Aortic atherosclerosis ([place]) 06/30/2016  . Diastolic CHF ([place]) [id number]  . Bacteremia due to Streptococcus 11/21/2015  . Cellulitis of leg, right 11/20/2015  . History of cardioembolic stroke [id number]  . Diverticulitis of colon 09/26/2014  . CKD (chronic kidney disease), stage III ([place]) 09/26/2014  . Laryngeal carcinoma ([place])   . Spinal stenosis, lumbar region, with neurogenic claudication 05/09/2014  . Weakness generalized 06/30/2013  . History of Focal seizure ([place]) 06/16/2013  . History of TIA (transient ischemic attack) 05/31/2013  . Bilateral hand pain 01/09/2013  .  History of Large B-cell lymphoma ([place]) 02/18/2012  . Atrial fibrillation ([place]) 12/14/2008  . Anxiety state 10/20/2008  . Depression 10/20/2008  . Dyspnea on exertion 07/06/2008  . COPD GOLD II 03/26/2008  . GERD (gastroesophageal reflux disease) 05/03/2007  . Hyperlipidemia 03/18/2007  . Essential hypertension 03/18/2007    Past Surgical History:  Procedure Laterality Date  . ABDOMINAL EXPLORATION SURGERY  12/01/2012  . APPENDECTOMY  09/13/2008  . CAROTID ENDARTERECTOMY Bilateral 2004   2004 a  month apart right and left  . CARPAL TUNNEL RELEASE Left ?1960's  . CATARACT EXTRACTION  2005   bilateral  . COLON SURGERY  09/13/2008   Laparoscopic assisted converted to open sigmoid colectomy.Archie Endo 09/13/2008 (05/26/2013)  . DILATION AND CURETTAGE OF UTERUS  1950's   "had 2 for miscarriages" (05/26/2013)  . DIVERTING ILEOSTOMY  09/22/2008   iliostomy for diverticulitis/notes 09/22/2008 (05/26/2013)  . HERNIA REPAIR  05/2010   ventral hernia repair  . ILEOSTOMY CLOSURE  01/2010  . RADICAL NECK DISSECTION  1986   "larynx cancer" (05/26/2013)  . TONSILLECTOMY AND ADENOIDECTOMY  1936   "tonsils grew back; no 2nd OR" (05/26/2013)  . TOTAL KNEE ARTHROPLASTY Bilateral    2002 left 2008 right  . TUBAL LIGATION  1972     OB History   No obstetric history on file.     Family History  Problem Relation Age of Onset  . Colon cancer Sister        colon  . Hyperlipidemia Mother   . Stroke Mother   . Cancer Father        mesothelioma    Social History   Tobacco Use  . Smoking status: Former Smoker    Packs/day: 2.50    Years: 37.00    Pack years: 92.50    Types: Cigarettes    Quit date: 09/05/1984    Years since quitting: 35.2  . Smokeless tobacco: Never Used  . Tobacco comment: quit when she was dx with cancer  Substance Use Topics  . Alcohol use: No    Alcohol/week: 0.0 standard drinks  . Drug use: No    Home Medications Prior to Admission medications   Medication Sig Start Date End Date Taking? Authorizing Provider  albuterol (VENTOLIN HFA) 108 (90 Base) MCG/ACT inhaler TAKE 2 PUFFS BY MOUTH EVERY 6 HOURS AS NEEDED FOR WHEEZE OR SHORTNESS OF BREATH 09/28/19   Marin Olp, MD  atorvastatin (LIPITOR) 20 MG tablet TAKE 1 TABLET BY MOUTH EVERY DAY 10/19/19   Marin Olp, MD  bisoprolol (ZEBETA) 5 MG tablet TAKE 1 TABLET BY MOUTH EVERY DAY 10/19/19   Marin Olp, MD  busPIRone ([name]) 5 MG tablet TAKE 1 TABLET BY MOUTH TWICE A DAY 12/07/19   Marin Olp, MD   cholecalciferol (VITAMIN D) 1000 UNITS tablet Take 1,000 Units by mouth daily.    [provider]  Cyanocobalamin (VITAMIN B-12 PO) Take 1 tablet by mouth daily.    [provider]  gabapentin (NEURONTIN) 100 MG capsule TAKE 1 CAPSULE BY MOUTH THREE TIMES A DAY Patient taking differently: Take 100 mg by mouth 2 (two) times daily.  04/04/19   Pieter Partridge, DO  hydrocortisone (ANUSOL-HC) 2.5 % rectal cream Place 1 application rectally at bedtime. 06/01/19   Zehr, Laban Emperor, PA-C  ketoconazole (NIZORAL) 2 % cream Apply 1 application topically daily. 10/25/18   Marin Olp, MD  levETIRAcetam (KEPPRA) 1000 MG tablet TAKE 1 TABLET BY MOUTH TWICE A DAY 09/28/19  Pieter Partridge, DO  loperamide (IMODIUM) 2 MG capsule Take 2 mg by mouth daily as needed for diarrhea or loose stools.     [provider]  omeprazole (PRILOSEC) 20 MG capsule Take 20 mg by mouth daily as needed.     [provider]  sertraline (ZOLOFT) 25 MG tablet TAKE 1 TABLET BY MOUTH EVERY DAY 11/23/19   Marin Olp, MD  triamcinolone cream (KENALOG) 0.1 % Apply 1 application topically 2 (two) times daily. For 7-10 days maximum 04/20/19   Marin Olp, MD  XARELTO 15 MG TABS tablet TAKE 1 TABLET BY MOUTH EVERY DAY WITH SUPPER 10/04/19   Marin Olp, MD    Allergies    Ativan [lorazepam], Propoxyphene hcl, Codeine, Penicillins, and Vancomycin  Review of Systems   Review of Systems  All other systems reviewed and are negative.   Physical Exam Updated Vital Signs BP (!) 178/89 (BP Location: Right Arm)   Pulse 71   Temp 97.6 F (36.4 C) (Oral)   Resp 16   LMP  (LMP Unknown)   SpO2 94%   Physical Exam Vitals and nursing note reviewed.  Constitutional:      General: She is not in acute distress.    Appearance: She is well-developed. She is obese. She is not ill-appearing, toxic-appearing or diaphoretic.  HENT:     Head: Normocephalic and atraumatic.     Right Ear: External  ear normal.     Left Ear: External ear normal.  Eyes:     Conjunctiva/sclera: Conjunctivae normal.     Pupils: Pupils are equal, round, and reactive to light.  Neck:     Trachea: Phonation normal.  Cardiovascular:     Rate and Rhythm: Normal rate and regular rhythm.     Heart sounds: Normal heart sounds.  Pulmonary:     Effort: Pulmonary effort is normal. No respiratory distress.     Breath sounds: Normal breath sounds. No stridor.  Chest:     Chest wall: No tenderness.  Abdominal:     General: There is no distension.     Palpations: Abdomen is soft.     Tenderness: There is no abdominal tenderness.  Musculoskeletal:        General: No swelling, tenderness or signs of injury. Normal range of motion.     Cervical back: Normal range of motion and neck supple.  Skin:    General: Skin is warm and dry.  Neurological:     Mental Status: She is alert and oriented to person, place, and time.     Cranial Nerves: No cranial nerve deficit.     Sensory: No sensory deficit.     Motor: No abnormal muscle tone.     Coordination: Coordination normal.  Psychiatric:        Mood and Affect: Mood normal.        Behavior: Behavior normal.     ED Results / Procedures / Treatments   Labs (all labs ordered are listed, but only abnormal results are displayed) Labs Reviewed  CBC - Abnormal; Notable for the following components:      Result Value   MCHC 29.8 (*)    All other components within normal limits  BASIC METABOLIC PANEL  TROPONIN I (HIGH SENSITIVITY)    EKG EKG Interpretation  Date/Time:  Wednesday Dec 13 2019 15:04:01 EDT Ventricular Rate:  73 PR Interval:  192 QRS Duration: 82 QT Interval:  416 QTC Calculation: 458 R Axis:  69 Text Interpretation: Sinus rhythm with occasional Premature ventricular complexes Septal infarct , age undetermined Abnormal ECG Since last tracing pvc is new Otherwise no significant change Confirmed by Daleen Bo [phone]) on 12/13/2019 4:01:57 PM    Radiology DG Chest 2 View  Result Date: 12/13/2019 CLINICAL DATA:  Chest pain EXAM: CHEST - 2 VIEW COMPARISON:  06/06/2019 FINDINGS: Stable cardiomegaly. Atherosclerotic calcification of the aortic knob. Patchy bibasilar airspace opacities. No pleural effusion. No pneumothorax. IMPRESSION: Patchy bibasilar airspace opacities may reflect atelectasis, aspiration or pneumonia. Electronically Signed   By: Davina Poke D.O.   On: 12/13/2019 15:44    Procedures Procedures (including critical care time)  Medications Ordered in ED Medications  sodium chloride flush (NS) 0.9 % injection 3 mL (has no administration in time range)    ED Course  I have reviewed the triage vital signs and the nursing notes.  Pertinent labs & imaging results that were available during my care of the patient were reviewed by me and considered in my medical decision making (see chart for details).  Clinical Course as of Dec 13 2006  Wed Dec 13, 2019  1755 Patient's daughter, Vania Rea, has arrived, and she was updated on the findings.  She is worried that the patient may have a UTI because sometimes she gets a little more confused than usual.  In the last 2 days she has been more confused.  She lives with the patient.  She states that the patient does have some waxing and waning level of confusion, which is ongoing.  She wants to be kept informed.  Her phone number is [phone].  Vania Rea understands that the patient may be able to be discharged, later.   [EW]  2007 Normal  Lactic acid, plasma [EW]  2007 Normal  CBC(!) [EW]  2007 Normal except creatinine elevated, GFR low  Basic metabolic panel(!) [EW]  [id number] Normal  Troponin I (High Sensitivity) [EW]  2007 Nonspecific bilateral basal infiltrates, interpreted by me  DG Chest 2 View [EW]    Clinical Course User Index [EW] Daleen Bo, MD   MDM Rules/Calculators/A&P                       Patient Vitals for the past 24 hrs:  BP Temp Temp src Pulse Resp  SpO2  12/13/19 1630 (!) 168/99 - - 66 - 99 %  12/13/19 1505 (!) 178/89 97.6 F (36.4 C) Oral 71 16 94 %    8:04 PM Reevaluation with update and discussion. After initial assessment and treatment, an updated evaluation reveals the patient refused additional evaluation with lactate and blood cultures.  She would not allow [place] to obtain a urine sample.  At this time she has removed her cardiac leads, and is standing up putting her coat on.  I talked to her daughter, Vania Rea, who just arrived back to the hospital grounds and is in the parking lot.  She will come in to get the patient.  I discussed the findings with her and answered all of her questions. Daleen Bo   Medical Decision Making:  This patient is presenting for evaluation of chest discomfort, which does require a range of treatment options, and is a complaint that involves a high risk of morbidity and mortality. The differential diagnoses include musculoskeletal chest pain, ACS, pneumonia, gastrointestinal disorders. I decided  to review old records, and in summary elderly patient, lives with her husband and daughter, and has a history of congestive heart failure, atrial  fibrillation and major depressive disorder.  She also has a history of agitation.  I obtained additional historical information from her daughter at the bedside.  Clinical Laboratory Tests Ordered, included CBC, metabolic panel, lactate, troponin. Review indicates, reassuring labs without acute infectious or sepsis.  Troponin negative.. Radiologic Tests Ordered, included chest x-ray.  I independently Visualized: Radiologic images, which show nonspecific infiltrates  Cardiac Monitor Tracing which shows normal sinus rhythm     Critical Interventions-clinical evaluation, laboratory testing, radiographic imaging, observation reassessment.  After These Interventions, the Patient was reevaluated and was found stable.  She refused urinary testing.  She refused any fluids.  She  tolerated oral medication given for bronchitis/early pneumonia. Patient became agitated during the period of observation and evaluation.  Her daughter came to get her.  I suspect that patient has a combination of dementia and agitation associated with psychiatric illness, contributing to her current state.  She is stable for discharge with outpatient management.  CRITICAL CARE-no Performed by: Daleen Bo  Nursing Notes Reviewed/ Care Coordinated Applicable Imaging Reviewed Interpretation of Laboratory Data incorporated into ED treatment  The patient appears reasonably screened and/or stabilized for discharge and I doubt any other medical condition or other [facility] requiring further screening, evaluation, or treatment in the ED at this time prior to discharge.  Plan: Home Medications-continue current; Home Treatments-gradual advance diet and activity; return here if the recommended treatment, does not improve the symptoms; Recommended follow up-PCP, 1 week and as needed   Final Clinical Impression(s) / ED Diagnoses Final diagnoses:  None    Rx / DC Orders ED Discharge Orders    None       Daleen Bo, MD 12/13/19 2011

## 2019-12-13 NOTE — Discharge Instructions (Signed)
It appears that your cough is caused by bronchitis.  We are prescribing an antibiotic to treat that.  Make sure you are getting plenty of rest, drinking a lot of fluids.  See your doctor next week for checkup.

## 2019-12-13 NOTE — ED Notes (Addendum)
Found patient standing at door with brief in hand. Pt started to get dressed and stated that she was leaving. This RN attempted to deescalate and redirect patient but pt continued to get dressed. Pt then stumbled on shoe after not putting it on all the way, and almost fell. This RN helped stabilize patient but then patient screamed, "Don't put your hands on me!" and started becoming physically aggressive. Pt did not fall. Daughter was called by MD and informed that pt wanted to leave. Discharge instructions reviewed with daughter at charge desk. Pt had refused to take first dose of antibiotic. Pt also refused discharge vitals and esignature

## 2019-12-13 NOTE — ED Triage Notes (Signed)
Pt bib with sharp intermittent substernal cp onset today. States pain only lasts a few minutes at a time. Pain free with ems. No known cardiac hx. Does report chronic cough with some worsening over the last few days.  90% RA (copd) 12 lead unremarkable 185/116

## 2019-12-14 ENCOUNTER — Other Ambulatory Visit: Payer: Self-pay

## 2019-12-14 ENCOUNTER — Encounter: Payer: Self-pay | Admitting: Family Medicine

## 2019-12-14 ENCOUNTER — Ambulatory Visit (INDEPENDENT_AMBULATORY_CARE_PROVIDER_SITE_OTHER): Payer: Medicare Other | Admitting: Family Medicine

## 2019-12-14 VITALS — BP 116/58 | HR 66 | Temp 98.6°F | Ht 64.0 in | Wt 191.4 lb

## 2019-12-14 DIAGNOSIS — J189 Pneumonia, unspecified organism: Secondary | ICD-10-CM | POA: Diagnosis not present

## 2019-12-14 DIAGNOSIS — R413 Other amnesia: Secondary | ICD-10-CM | POA: Diagnosis not present

## 2019-12-14 NOTE — Progress Notes (Signed)
Phone [phone] In person visit   Subjective:   [name] is a 86 y.o. year old very pleasant female patient who presents for/with See problem oriented charting Chief Complaint  Patient presents with  . Hospitalization Follow-up   This visit occurred during the SARS-CoV-2 public health emergency.  Safety protocols were in place, including screening questions prior to the visit, additional usage of staff PPE, and extensive cleaning of exam room while observing appropriate contact time as indicated for disinfecting solutions.   Past Medical History-  Patient Active Problem List   Diagnosis Date Noted  . Diastolic CHF ([place]) [id number]    Priority: High  . History of cardioembolic stroke [id number]    Priority: High  . Spinal stenosis, lumbar region, with neurogenic claudication 05/09/2014    Priority: High  . History of Focal seizure ([place]) 06/16/2013    Priority: High  . History of TIA (transient ischemic attack) 05/31/2013    Priority: High  . History of Large B-cell lymphoma  02/18/2012    Priority: High  . Atrial fibrillation ([place]) 12/14/2008    Priority: High  . Agitation 03/11/2018    Priority: Medium  . History of chronic respiratory failure- prior on o2 06/15/2017    Priority: Medium  . Bacteremia due to Streptococcus 11/21/2015    Priority: Medium  . Cellulitis of leg, right 11/20/2015    Priority: Medium  . Diverticulitis of colon 09/26/2014    Priority: Medium  . CKD (chronic kidney disease), stage III ([place]) 09/26/2014    Priority: Medium  . Laryngeal carcinoma ([place])     Priority: Medium  . Anxiety state 10/20/2008    Priority: Medium  . Depression 10/20/2008    Priority: Medium  . Dyspnea on exertion 07/06/2008    Priority: Medium  . COPD GOLD II 03/26/2008    Priority: Medium  . Hyperlipidemia 03/18/2007    Priority: Medium  . Essential hypertension 03/18/2007    Priority: Medium  . Chronic diarrhea 09/04/2017    Priority: Low  . Aortic  atherosclerosis ([place]) 06/30/2016    Priority: Low  . Weakness generalized 06/30/2013    Priority: Low  . Bilateral hand pain 01/09/2013    Priority: Low  . Memory loss 12/14/2019  . Recurrent major depressive disorder, in full remission ([place]) 10/16/2019  . Loose stools 06/01/2019  . Hemorrhoids 06/01/2019  . Chest pain 04/25/2018  . GERD (gastroesophageal reflux disease) 05/03/2007    Medications- reviewed and updated Current Outpatient Medications  Medication Sig Dispense Refill  . albuterol (VENTOLIN HFA) 108 (90 Base) MCG/ACT inhaler TAKE 2 PUFFS BY MOUTH EVERY 6 HOURS AS NEEDED FOR WHEEZE OR SHORTNESS OF BREATH 18 g 1  . atorvastatin (LIPITOR) 20 MG tablet TAKE 1 TABLET BY MOUTH EVERY DAY 90 tablet 1  . bisoprolol (ZEBETA) 5 MG tablet TAKE 1 TABLET BY MOUTH EVERY DAY 90 tablet 1  . busPIRone ([name]) 5 MG tablet TAKE 1 TABLET BY MOUTH TWICE A DAY 180 tablet 3  . cholecalciferol (VITAMIN D) 1000 UNITS tablet Take 1,000 Units by mouth daily.    . Cyanocobalamin (VITAMIN B-12 PO) Take 1 tablet by mouth daily.    Marland Kitchen gabapentin (NEURONTIN) 100 MG capsule TAKE 1 CAPSULE BY MOUTH THREE TIMES A DAY (Patient taking differently: Take 100 mg by mouth 2 (two) times daily. ) 270 capsule 0  . ketoconazole (NIZORAL) 2 % cream Apply 1 application topically daily. 15 g 0  . levETIRAcetam (KEPPRA) 1000 MG tablet TAKE 1 TABLET BY  MOUTH TWICE A DAY 180 tablet 0  . levofloxacin (LEVAQUIN) 250 MG tablet Take 1 tablet (250 mg total) by mouth daily. 7 tablet 0  . loperamide (IMODIUM) 2 MG capsule Take 2 mg by mouth daily as needed for diarrhea or loose stools.     Marland Kitchen omeprazole (PRILOSEC) 20 MG capsule Take 20 mg by mouth daily as needed.     . sertraline (ZOLOFT) 25 MG tablet TAKE 1 TABLET BY MOUTH EVERY DAY 90 tablet 0  . triamcinolone cream (KENALOG) 0.1 % Apply 1 application topically 2 (two) times daily. For 7-10 days maximum 80 g 0  . XARELTO 15 MG TABS tablet TAKE 1 TABLET BY MOUTH EVERY DAY WITH  SUPPER 30 tablet 10  . hydrocortisone (ANUSOL-HC) 2.5 % rectal cream Place 1 application rectally at bedtime. (Patient not taking: Reported on 12/14/2019) 30 g 0   No current facility-administered medications for this visit.     Objective:  BP (!) 116/58 (BP Location: Left Arm, Patient Position: Sitting, Cuff Size: Normal)   Pulse 66   Temp 98.6 F (37 C) (Temporal)   Ht 5\' 4"  (1.626 m)   Wt 191 lb 6.4 oz (86.8 kg)   LMP  (LMP Unknown)   SpO2 (!) 88%   BMI 32.85 kg/m  Gen: NAD, resting comfortably CV: RRR no murmurs rubs or gallops Lungs: CTAB no crackles, wheeze, rhonchi- prolonged expiratory phase Abdomen: soft/nontender/nondistended/normal bowel sounds. No rebound or guarding.  Ext: trace edema Skin: warm, dry Neuro: tangential, confused- does not seem too dissimilar from baseline    Assessment and Plan  #ED follow-up for pneumonia-presented with chest pain/cough/agitation/confusion  S:Patient was seen at the ED on 12/15/2019 for SOB and sharp pains in her chest (she states from 10 AM to 5 PM). Her oxygen usually runs in the 80's and was 88 today in office without oxygen. Denies and chest pain today and states  She is feeling better today. New cough with this.   Per notes from emergency room-daughter has been concerned about possible UTI-patient declined to submit urine sample.  Patient also refused fluids.  Lactic acid was not elevated.  High-sensitivity troponin was not elevated.  Chest x-ray with nonspecific bilateral basal infiltrates-concern for possible pneumonia or bronchitis. Patient has known COPD.  PatientTold her daughter on Sunday that she had pneumonia before she had worsening confusion/irritability. Apparently walked out of hospital naked except for winter coat per daughter though this was not in the ED notes  Patient was advised to take 500 mg of Levaquin on day of ED visit and then 250 mg daily for the next 7 days but she took the 500 mg pill at home and still has  in her hand today.  Reviewed: 1. Blood cultures pending 2. Lactic acid not elevated 3. Troponin high sensitivity not elevated x2 cycled 4. CBC largely normal 5. BMP largely normal- CKD stage III 6. CXR- patchy bibasilar airspace opacities- pneumonia vs aspiration vs. atelectasis -started on levaquin 250mg  daily 7. Daughter reports rapid covid test ws negative (dont see in chart) A/P: I do think the plan for treatment with Levaquin is reasonable-particular with her COPD.  There are multiple potential side effects with Levaquin-I briefly reviewed some of these-I was hesitant to directly review all of them with the patient who already had some paranoia/suspicion from her visit to the emergency room yesterday-thus the fact she was still holding in her hand the medical treatment that they gave her a day ago.  During the  visit she did take the 1st dose of Levaquin with water and she will complete this course -Patient's oxygen down to 88%-she refuses oxygen.  Given she left the emergency room and was resistant to treatment yesterday-I do not think sending her back to the emergency room is worthwhile at present unless she has new or worsening symptoms-they are able to monitor her oxygen at home. -Family reports COVID-19 testing rapid test yesterday-I do not see this in the chart but I do not think patient would cooperate to go get tested regardless-once again we will treat with Levaquin and if she has new or worsening symptoms seek care.  Patient may also have UTI but has refused so far to collect urine sample.  Levaquin would likely cover many strains of UTI but if patient fails to see improvement in her confusion-we can have her back to recollect   Memory loss I strongly suspect underlying dementia.  We discussed during the visit lower "brain reserve" and how this can contribute to infections causing increased confusion easily.  Discussed possible referral to neurology specifically to discuss but patient  and family declined for now.  They have a relationship with Dr. Tomi Likens and they agree to bring this up at next visit  Family does ask about increasing buspirone possibly to twice a day but we wanted to see how patient does after treatment of current possible pneumonia before starting this   Recommended follow up: As needed for new or worsening symptoms or symptoms that fail to improve Future Appointments  Date Time Provider [place]  12/22/2019  9:45 AM Marzetta Board, DPM [facility]  05/17/2020 11:30 AM Ladell Pier, MD [facility] None    Lab/Order associations:   ICD-10-CM   1. Pneumonia of both lower lobes due to infectious organism  J18.9   2. Memory loss  R41.3     Time Spent: 31 minutes of total time (3:50 PM- 4:21 PM) was spent on the date of the encounter performing the following actions: chart review prior to seeing the patient, obtaining history, performing a medically necessary exam, counseling on the treatment plan, placing orders, and documenting in our EHR.   Return precautions advised.  [name], MD

## 2019-12-14 NOTE — Assessment & Plan Note (Addendum)
I strongly suspect underlying dementia.  We discussed during the visit lower "brain reserve" and how this can contribute to infections causing increased confusion easily.  Discussed possible referral to neurology specifically to discuss but patient and family declined for now.  They have a relationship with Dr. Tomi Likens and they agree to bring this up at next visit  Family does ask about increasing buspirone possibly to twice a day but we wanted to see how patient does after treatment of current possible pneumonia before starting this

## 2019-12-14 NOTE — Patient Instructions (Addendum)
Completed course of Levaquin-let us know if you do not begin to feel better within a few days or if symptoms worsen.  Recommended follow up: Return for as needed for new, worsening, persistent symptoms.

## 2019-12-18 ENCOUNTER — Telehealth: Payer: Self-pay | Admitting: Family Medicine

## 2019-12-18 ENCOUNTER — Telehealth: Payer: Self-pay | Admitting: Neurology

## 2019-12-18 LAB — CULTURE, BLOOD (ROUTINE X 2)
Culture: NO GROWTH
Special Requests: ADEQUATE

## 2019-12-18 NOTE — Telephone Encounter (Signed)
Please advise 

## 2019-12-18 NOTE — Telephone Encounter (Signed)
I am ok with referral to [place] [facility] but the trade off is that Dr. Tomi Likens knows her history well and for the particular concern of memory loss- im ok waiting for September visit if they are- if not may place referral to [place] [facility].

## 2019-12-18 NOTE — Telephone Encounter (Signed)
Telephone call back to Pt Daughter, Pt got upset while in the ED so she left and was seen by her PCP. Per Pt the pt PCP state she needs see DR. Jaffe. Pt states she is seeing things and turning into someone else at times.   Pt fell a couple of month ago no brushing seen by her pcp, Pt fell Friday as well. Pt didn't hit her head. Per the PCP he thinks she may have onset dementia.    Pt Daughter stated she wanted to know if she could be sooner then September? She wants a call back.

## 2019-12-18 NOTE — Telephone Encounter (Signed)
Advised husband to schedule a visit.

## 2019-12-18 NOTE — Telephone Encounter (Signed)
Patient's daughter called requesting a call back from Dr. Georgie Chard nurse. The patient was recently seen in the ED and the daughter has some concerns about memory. She declined to schedule an appointment until speaking with a nurse.

## 2019-12-18 NOTE — Telephone Encounter (Signed)
Dementia is not urgent.  She can be put on the cancellation list.

## 2019-12-18 NOTE — Telephone Encounter (Signed)
Pt daughter called stating that pt was referred to Dr. Georgie Chard office. Daughter states that pt is not able to get scheduled at the office until September. Daughter asked if Dr. Yong Channel could refer pt to another neurologist where she could be seen sooner. Please advise.

## 2019-12-19 ENCOUNTER — Other Ambulatory Visit: Payer: Self-pay

## 2019-12-19 DIAGNOSIS — R413 Other amnesia: Secondary | ICD-10-CM

## 2019-12-19 NOTE — Telephone Encounter (Signed)
Called and spoke with pt daughter and gave below information. Pt daughter states they can not wait until Sept. Referral to [facility] has been placed and pt daughter made aware.

## 2019-12-20 ENCOUNTER — Telehealth: Payer: Self-pay | Admitting: Family Medicine

## 2019-12-20 NOTE — Telephone Encounter (Signed)
You can call to see if he can be sooner but I doubt it given current physician shortages. I had suggested that family wait but they wanted the referral placed to [facility]. .   You can certainly call neurology to see if they can move up the visit though with Dr. Tomi Likens

## 2019-12-20 NOTE — Telephone Encounter (Signed)
FYI would you like me to call office?

## 2019-12-20 NOTE — Telephone Encounter (Signed)
I received a call from [facility].  They saw a message in the chart that basically says they only reason they are being referred there is because the follow up with Dr Tomi Likens is scheduled in Sept and the family didn't want to wait that long.  The providers at [facility] didn't appreciate seeing this and the referral has been declined.  They recommend Dr Yong Channel reaching out to Dr Tomi Likens to see if she can be seen sooner.

## 2019-12-20 NOTE — Telephone Encounter (Signed)
04/30/20 at 8:50 AM with Dr. Tomi Likens and patient is on the wait list for sooner availability.

## 2019-12-22 ENCOUNTER — Other Ambulatory Visit: Payer: Self-pay

## 2019-12-22 ENCOUNTER — Encounter: Payer: Self-pay | Admitting: Podiatry

## 2019-12-22 ENCOUNTER — Ambulatory Visit (INDEPENDENT_AMBULATORY_CARE_PROVIDER_SITE_OTHER): Payer: Medicare Other | Admitting: Podiatry

## 2019-12-22 VITALS — BP 118/54 | HR 79 | Temp 96.0°F | Resp 16

## 2019-12-22 DIAGNOSIS — B351 Tinea unguium: Secondary | ICD-10-CM | POA: Diagnosis not present

## 2019-12-22 DIAGNOSIS — M2041 Other hammer toe(s) (acquired), right foot: Secondary | ICD-10-CM

## 2019-12-22 DIAGNOSIS — M79674 Pain in right toe(s): Secondary | ICD-10-CM

## 2019-12-22 DIAGNOSIS — M79675 Pain in left toe(s): Secondary | ICD-10-CM

## 2019-12-22 DIAGNOSIS — M2042 Other hammer toe(s) (acquired), left foot: Secondary | ICD-10-CM | POA: Diagnosis not present

## 2019-12-22 MED ORDER — NONFORMULARY OR COMPOUNDED ITEM
3 refills | Status: DC
Start: 2019-12-22 — End: 2021-08-10

## 2019-12-22 NOTE — Patient Instructions (Signed)
[facility] [phone] Antifungal nail solution  Onychomycosis/Fungal Toenails  WHAT IS IT? An infection that lies within the keratin of your nail plate that is caused by a fungus.  WHY ME? Fungal infections affect all ages, sexes, races, and creeds.  There may be many factors that predispose you to a fungal infection such as age, coexisting medical conditions such as diabetes, or an autoimmune disease; stress, medications, fatigue, genetics, etc.  Bottom line: fungus thrives in a warm, moist environment and your shoes offer such a location.  IS IT CONTAGIOUS? Theoretically, yes.  You do not want to share shoes, nail clippers or files with someone who has fungal toenails.  Walking around barefoot in the same room or sleeping in the same bed is unlikely to transfer the organism.  It is important to realize, however, that fungus can spread easily from one nail to the next on the same foot.  HOW DO WE TREAT THIS?  There are several ways to treat this condition.  Treatment may depend on many factors such as age, medications, pregnancy, liver and kidney conditions, etc.  It is best to ask your doctor which options are available to you.  1. No treatment.   Unlike many other medical concerns, you can live with this condition.  However for many people this can be a painful condition and may lead to ingrown toenails or a bacterial infection.  It is recommended that you keep the nails cut short to help reduce the amount of fungal nail. 2. Topical treatment.  These range from herbal remedies to prescription strength nail lacquers.  About 40-50% effective, topicals require twice daily application for approximately 9 to 12 months or until an entirely new nail has grown out.  The most effective topicals are medical grade medications available through physicians offices. 3. Oral antifungal medications.  With an 80-90% cure rate, the most common oral medication requires 3 to 4 months of therapy and stays  in your system for a year as the new nail grows out.  Oral antifungal medications do require blood work to make sure it is a safe drug for you.  A liver function panel will be performed prior to starting the medication and after the first month of treatment.  It is important to have the blood work performed to avoid any harmful side effects.  In general, this medication safe but blood work is required. 4. Laser Therapy.  This treatment is performed by applying a specialized laser to the affected nail plate.  This therapy is noninvasive, fast, and non-painful.  It is not covered by insurance and is therefore, out of pocket.  The results have been very good with a 80-95% cure rate.  [facility] is the only practice in the area to offer this therapy. 5. Permanent Nail Avulsion.  Removing the entire nail so that a new nail will not grow back. 

## 2019-12-22 NOTE — Progress Notes (Signed)
Subjective: [name] presents today referred by Marin Olp, MD for complaint of painful mycotic nails b/l that are difficult to trim. Pain interferes with ambulation. She cannot reach down to trim them anymore. Her husband is afraid to attempt trimming due to [name] being on blood thinners. Aggravating factors include wearing enclosed shoe gear. Pain is relieved with periodic professional debridement.   Her husband is also present during today's visit.  Past Medical History:  Diagnosis Date  . ABDOMINAL INCISIONAL HERNIA 04/03/2010      . Anxiety   . Arthritis    "lots; hands" (05/26/2013)  . Blood transfusion    "w/1st miscarriage" (05/26/2013)  . COPD (chronic obstructive pulmonary disease) ([place])   . Coronary artery disease   . Depression   . Diverticulitis of colon with hemorrhage 07/24/2008  . DVT (deep venous thrombosis) ([place]) 1954   "after I had my first child" (05/26/2013)  . Exertional shortness of breath   . Family history of anesthesia complication    "sisters also get sick as a dog" (05/26/2013)  . GERD 05/03/2007      . GERD (gastroesophageal reflux disease)   . H/O hiatal hernia   . HCAP (healthcare-associated pneumonia) 06/26/2013  . Heart murmur    "when I was a child" (05/26/2013)  . History of TIAs   . Hyperlipidemia   . Hypertension   . Keratotic plaque   . 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 Lymphona, mantle cell, inguinal region/lower limb ([place])    "large c-cell" (05/26/2013)  . OSTEOARTHRITIS 05/03/2007  . PEPTIC ULCER DISEASE 07/06/2008  . Pneumonia 2013   "once" (05/26/2013)  . PUD (peptic ulcer disease)   . PVD (peripheral vascular disease) ([place])   . Seizures ([place])   . Stroke ([place]) 05/26/2013   "very mild; affected my speech for a while" (05/26/2013)  . VARICOSE VEINS LOWER EXTREMITIES W/INFLAMMATION 01/15/2009     Patient Active Problem List   Diagnosis Date Noted  .  Memory loss 12/14/2019  . Recurrent major depressive disorder, in full remission ([place]) 10/16/2019  . Loose stools 06/01/2019  . Hemorrhoids 06/01/2019  . Chest pain 04/25/2018  . Agitation 03/11/2018  . Chronic diarrhea 09/04/2017  . History of chronic respiratory failure- prior on o2 06/15/2017  . Aortic atherosclerosis ([place]) 06/30/2016  . Diastolic CHF ([place]) [id number]  . Bacteremia due to Streptococcus 11/21/2015  . Cellulitis of leg, right 11/20/2015  . History of cardioembolic stroke [id number]  . Diverticulitis of colon 09/26/2014  . CKD (chronic kidney disease), stage III ([place]) 09/26/2014  . Laryngeal carcinoma ([place])   . Spinal stenosis, lumbar region, with neurogenic claudication 05/09/2014  . Weakness generalized 06/30/2013  . History of Focal seizure ([place]) 06/16/2013  . History of TIA (transient ischemic attack) 05/31/2013  . Bilateral hand pain 01/09/2013  . History of Large B-cell lymphoma  02/18/2012  . Atrial fibrillation ([place]) 12/14/2008  . Anxiety state 10/20/2008  . Depression 10/20/2008  . Dyspnea on exertion 07/06/2008  . COPD GOLD II 03/26/2008  . GERD (gastroesophageal reflux disease) 05/03/2007  . Hyperlipidemia 03/18/2007  . Essential hypertension 03/18/2007     Past Surgical History:  Procedure Laterality Date  . ABDOMINAL EXPLORATION SURGERY  12/01/2012  . APPENDECTOMY  09/13/2008  . CAROTID ENDARTERECTOMY Bilateral 2004   2004 a month apart right and left  . CARPAL TUNNEL RELEASE Left ?1960's  . CATARACT EXTRACTION  2005   bilateral  .  COLON SURGERY  09/13/2008   Laparoscopic assisted converted to open sigmoid colectomy.Archie Endo 09/13/2008 (05/26/2013)  . DILATION AND CURETTAGE OF UTERUS  1950's   "had 2 for miscarriages" (05/26/2013)  . DIVERTING ILEOSTOMY  09/22/2008   iliostomy for diverticulitis/notes 09/22/2008 (05/26/2013)  . HERNIA REPAIR  05/2010   ventral hernia repair  . ILEOSTOMY CLOSURE  01/2010  . RADICAL NECK DISSECTION  1986   "larynx  cancer" (05/26/2013)  . TONSILLECTOMY AND ADENOIDECTOMY  1936   "tonsils grew back; no 2nd OR" (05/26/2013)  . TOTAL KNEE ARTHROPLASTY Bilateral    2002 left 2008 right  . TUBAL LIGATION  1972     Current Outpatient Medications on File Prior to Visit  Medication Sig Dispense Refill  . albuterol (VENTOLIN HFA) 108 (90 Base) MCG/ACT inhaler TAKE 2 PUFFS BY MOUTH EVERY 6 HOURS AS NEEDED FOR WHEEZE OR SHORTNESS OF BREATH 18 g 1  . atorvastatin (LIPITOR) 20 MG tablet TAKE 1 TABLET BY MOUTH EVERY DAY 90 tablet 1  . bisoprolol (ZEBETA) 5 MG tablet TAKE 1 TABLET BY MOUTH EVERY DAY 90 tablet 1  . busPIRone ([name]) 5 MG tablet TAKE 1 TABLET BY MOUTH TWICE A DAY 180 tablet 3  . cholecalciferol (VITAMIN D) 1000 UNITS tablet Take 1,000 Units by mouth daily.    . Cyanocobalamin (VITAMIN B-12 PO) Take 1 tablet by mouth daily.    Marland Kitchen gabapentin (NEURONTIN) 100 MG capsule TAKE 1 CAPSULE BY MOUTH THREE TIMES A DAY (Patient taking differently: Take 100 mg by mouth 2 (two) times daily. ) 270 capsule 0  . hydrocortisone (ANUSOL-HC) 2.5 % rectal cream Place 1 application rectally at bedtime. 30 g 0  . ketoconazole (NIZORAL) 2 % cream Apply 1 application topically daily. 15 g 0  . levETIRAcetam (KEPPRA) 1000 MG tablet TAKE 1 TABLET BY MOUTH TWICE A DAY 180 tablet 0  . levofloxacin (LEVAQUIN) 250 MG tablet Take 1 tablet (250 mg total) by mouth daily. 7 tablet 0  . loperamide (IMODIUM) 2 MG capsule Take 2 mg by mouth daily as needed for diarrhea or loose stools.     Marland Kitchen omeprazole (PRILOSEC) 20 MG capsule Take 20 mg by mouth daily as needed.     . sertraline (ZOLOFT) 25 MG tablet TAKE 1 TABLET BY MOUTH EVERY DAY 90 tablet 0  . triamcinolone cream (KENALOG) 0.1 % Apply 1 application topically 2 (two) times daily. For 7-10 days maximum 80 g 0  . XARELTO 15 MG TABS tablet TAKE 1 TABLET BY MOUTH EVERY DAY WITH SUPPER 30 tablet 10   No current facility-administered medications on file prior to visit.     Allergies   Allergen Reactions  . Ativan [Lorazepam] Other (See Comments)    Severe hallucinations   . Propoxyphene Hcl Nausea And Vomiting  . Codeine Itching  . Penicillins Rash    Has patient had a PCN reaction causing immediate rash, facial/tongue/throat swelling, SOB or lightheadedness with hypotension: Yes Has patient had a PCN reaction causing severe rash involving mucus membranes or skin necrosis: Unk Has patient had a PCN reaction that required hospitalization: No Has patient had a PCN reaction occurring within the last 10 years: Yes If all of the above answers are "NO", then may proceed with Cephalosporin use.   . Vancomycin Rash     Social History   Occupational History  . Occupation: retired    Fish farm manager: RETIRED  Tobacco Use  . Smoking status: Former Smoker    Packs/day: 2.50    Years:  37.00    Pack years: 92.50    Types: Cigarettes    Quit date: 09/05/1984    Years since quitting: 35.3  . Smokeless tobacco: Never Used  . Tobacco comment: quit when she was dx with cancer  Substance and Sexual Activity  . Alcohol use: No    Alcohol/week: 0.0 standard drinks  . Drug use: No  . Sexual activity: Not Currently     Family History  Problem Relation Age of Onset  . Colon cancer Sister        colon  . Hyperlipidemia Mother   . Stroke Mother   . Cancer Father        mesothelioma     Immunization History  Administered Date(s) Administered  . Fluad Quad(high Dose 65+) 04/20/2019  . Influenza, High Dose Seasonal PF 05/05/2013, 04/28/2016, 06/16/2018  . Influenza,inj,Quad PF,6+ Mos 04/24/2014, 05/06/2015, 04/26/2017  . PFIZER SARS-COV-2 Vaccination 08/17/2019, 09/04/2019  . Pneumococcal Conjugate-13 04/11/2015  . Pneumococcal Polysaccharide-23 05/05/2013  . Tdap 01/27/2011     Objective: General: [name] is a pleasant 86 y.o. Caucasian female, WD, WN in NAD. AAO x 3. Vitals:   12/22/19 1003  BP: (!) 118/54  Pulse: 79  Resp: 16  Temp: (!) 96 F (35.6 C)     Vascular Examination:  Capillary refill time to digits immediate b/l. Palpable DP pulses b/l. Palpable PT pulses b/l. Pedal hair sparse b/l. Skin temperature gradient within normal limits b/l. Varicosities present b/l.  Dermatological Examination: Pedal skin with normal turgor, texture and tone bilaterally. No open wounds bilaterally. No interdigital macerations bilaterally. Toenails 1-5 b/l elongated, dystrophic, thickened, crumbly with subungual debris and tenderness to dorsal palpation.  Musculoskeletal: Normal muscle strength 5/5 to all lower extremity muscle groups bilaterally. No pain crepitus or joint limitation noted with ROM b/l. Hammertoes noted to the 2-5 bilaterally.  Neurological: Protective sensation intact 5/5 intact bilaterally with 10g monofilament b/l. Vibratory sensation intact b/l. Proprioception intact bilaterally. Babinski reflex negative b/l. Achilles reflex 2+ b/l. Clonus negative b/l.   Assessment: 1. Pain due to onychomycosis of toenails of both feet   2. Acquired hammertoes of both feet     Plan: -Examined patient. -Discussed topical, laser and oral medication. Patient opted for topical treatment with compounded medication. Rx written for nonformulary compounding topical antifungal: [facility]: Antifungal cream - Terbinafine 3%, Fluconazole 2%, Tea Tree Oil 5%, Urea 10%, Ibuprofen 2% in DMSO Suspension #36ml. Apply to the affected nail(s) at bedtime. -Toenails 1-5 b/l were debrided in length and girth with sterile nail nippers and dremel without iatrogenic bleeding.  -Patient to continue soft, supportive shoe gear daily. -Patient to report any pedal injuries to medical professional immediately. -Patient/POA to call should there be question/concern in the interim.  Return in about 3 months (around 03/23/2020) for nail trim.

## 2019-12-25 ENCOUNTER — Other Ambulatory Visit: Payer: Self-pay | Admitting: Neurology

## 2020-01-06 ENCOUNTER — Other Ambulatory Visit: Payer: Self-pay | Admitting: Family Medicine

## 2020-02-06 ENCOUNTER — Ambulatory Visit (INDEPENDENT_AMBULATORY_CARE_PROVIDER_SITE_OTHER): Payer: Medicare Other | Admitting: Neurology

## 2020-02-06 ENCOUNTER — Encounter: Payer: Self-pay | Admitting: Neurology

## 2020-02-06 ENCOUNTER — Other Ambulatory Visit: Payer: Self-pay

## 2020-02-06 VITALS — BP 145/69 | HR 70 | Ht 64.0 in | Wt 192.0 lb

## 2020-02-06 DIAGNOSIS — F3342 Major depressive disorder, recurrent, in full remission: Secondary | ICD-10-CM | POA: Diagnosis not present

## 2020-02-06 DIAGNOSIS — Z79899 Other long term (current) drug therapy: Secondary | ICD-10-CM

## 2020-02-06 DIAGNOSIS — I639 Cerebral infarction, unspecified: Secondary | ICD-10-CM | POA: Diagnosis not present

## 2020-02-06 DIAGNOSIS — G40109 Localization-related (focal) (partial) symptomatic epilepsy and epileptic syndromes with simple partial seizures, not intractable, without status epilepticus: Secondary | ICD-10-CM

## 2020-02-06 MED ORDER — DIVALPROEX SODIUM ER 250 MG PO TB24
ORAL_TABLET | ORAL | 0 refills | Status: DC
Start: 2020-02-06 — End: 2020-02-08

## 2020-02-06 NOTE — Progress Notes (Signed)
NEUROLOGY FOLLOW UP OFFICE NOTE  KIRSTIE LARSEN [id number]  HISTORY OF PRESENT ILLNESS: Yared Susan is an 86 year old right-handed female with lumbar stenosis, mild dementia and history of symptomatic seizure secondary to cardioembolic stroke who follows up for irritability.  UPDATE: Current medications:  Keppra 1000mg  twice daily; [name], gabapentin 200mg  at bedtime, sertraline 25mg  daily  Last seen in November 2019.  She has had increased anger.  She lives with her daughter and husband.  She takes gabapentin for pain but it is not effective.  She has some short term memory problems but no significant memory deficits.  No seizures.  Labs 12/13/2019:  CBC with WBC 5.5, HGB 12.6, HCT 42.3, PLT 165; BMP with Na 141, K 5, Cl 103, CO2 31, glucose 106, BUN 15, Cr 1.06, Ca 9.9 10/16/2019:  Hepatic panel with t bili 0.5, ALP 81, AST 13, ALT 13.  HISTORY: She has had several episodes of confusion and aphasia in October and November of 2014, associated with stroke and presumed seizure. On 05/26/13, MRI of the brain was performed, which revealed an acute lacunar infarct in the left anterior corona radiata and lentiform nucleus. CTA of the head revealed atherosclerotic changes in the cavernous carotid arteries without significant stenosis. CTA of neck revealed focal 50% stenosis of mid right ICA. She was switched from [name] to Eliquis. She was readmitted to the hospital on 05/31/13 with recurrent aphasia. She had trouble getting her words out and then couldn't speak at all. Repeat MRI of brain revealed new punctate infarct in the left posterior corona radiata, the same location as previous infarct. No changes in management were made. She was readmitted to the hospital on 06/07/13 for altered mental status. EEG was performed, showing left fronto-parieto-temporal theta slowing. Due to possibility of seizure, she was started on Keppra 250mg  BID. No change made to Eliquis. She  presented to the ED on 06/16/13 after waking up in the morning with expressive aphasia and right-sided weakness. Symptoms improved in the ED. CT head revealed prior lacunar infarct in left lentiform nucleus and corona radiata, but no new abnormalities. It was suspected that she may have had another seizure and her Keppra was increased to 500mg  BID. She was admitted to the hospital again on 06/21/13 again for expressive aphasia. MRI brain revealed 2 new punctate infarcts in the bilateral hemispheres at level of centrum semiovale. Eliquis was changed to Xarelto. Keppra was increased to 750mg  BID.   She presented to the ED on 08/05/14 for recurrent episode of aphasia, lasting 1.5 hours. This was more significant than the previous year. She described it as knowing what she wanted to say but having trouble getting the words out. She had trouble writing as well. MRI of brain revealed no acute infarct. She had an episode two days prior, lasting 1 hour. Carotid doppler revealed no hemodynamically significant ICA stenosis.   She was admitted to [facility] from 05/01/17 to 05/03/17 for altered mental status and generalized weakness with 2 seizures over the weekend. She was found by her daughter sitting in the dark moaning with foaming at the mouth and left sided shaking lasting a couple of minutes. She was altered for much longer time. CT of head from 04/30/17 and 05/01/17 revealed no acute changes. She was found to have a temperature of 100.3. She was found to have Klebsiella UTI/cystitis on UA and culture. She was loaded with Keppra and treated with Cipro. Keppra was increased to 1000mg  twice daily. About  a week prior, she reportedly had another brief episode of jibberish speech and right facial droop, lasting 2-3 minutes.  In 2019, she has had significant depression related to her chronic pain, loss of independence (such as no longer able to drive), and the death of her daughter.  Back in  June, we try to transition her from [place] to [name] in an attempt to alleviate some of her irritability.  She began experiencing increased confusion, falls, irritability and paranoia.  She was subsequently switched back from [name] to [place].  She was started on [name] to help with agitation.    PAST MEDICAL HISTORY: Past Medical History:  Diagnosis Date  . ABDOMINAL INCISIONAL HERNIA 04/03/2010      . Anxiety   . Arthritis    "lots; hands" (05/26/2013)  . Blood transfusion    "w/1st miscarriage" (05/26/2013)  . COPD (chronic obstructive pulmonary disease) ([place])   . Coronary artery disease   . Depression   . Diverticulitis of colon with hemorrhage 07/24/2008  . DVT (deep venous thrombosis) ([place]) 1954   "after I had my first child" (05/26/2013)  . Exertional shortness of breath   . Family history of anesthesia complication    "sisters also get sick as a dog" (05/26/2013)  . GERD 05/03/2007      . GERD (gastroesophageal reflux disease)   . H/O hiatal hernia   . HCAP (healthcare-associated pneumonia) 06/26/2013  . Heart murmur    "when I was a child" (05/26/2013)  . History of TIAs   . Hyperlipidemia   . Hypertension   . Keratotic plaque   . Laryngeal carcinoma ([place]) hx of ca   remotely with resection and xrt/chronic hoarsemenss  . Lymphona, mantle cell, inguinal region/lower limb ([place])    "large c-cell" (05/26/2013)  . OSTEOARTHRITIS 05/03/2007  . PEPTIC ULCER DISEASE 07/06/2008  . Pneumonia 2013   "once" (05/26/2013)  . PUD (peptic ulcer disease)   . PVD (peripheral vascular disease) ([place])   . Seizures ([place])   . Stroke ([place]) 05/26/2013   "very mild; affected my speech for a while" (05/26/2013)  . VARICOSE VEINS LOWER EXTREMITIES W/INFLAMMATION 01/15/2009    MEDICATIONS: Current Outpatient Medications on File Prior to Visit  Medication Sig Dispense Refill  . levETIRAcetam (KEPPRA) 1000 MG tablet TAKE 1 TABLET BY MOUTH TWICE A DAY 180 tablet 0  . albuterol (VENTOLIN  HFA) 108 (90 Base) MCG/ACT inhaler TAKE 2 PUFFS BY MOUTH EVERY 6 HOURS AS NEEDED FOR WHEEZE OR SHORTNESS OF BREATH 18 g 1  . atorvastatin (LIPITOR) 20 MG tablet TAKE 1 TABLET BY MOUTH EVERY DAY 90 tablet 1  . bisoprolol (ZEBETA) 5 MG tablet TAKE 1 TABLET BY MOUTH EVERY DAY 90 tablet 1  . busPIRone ([name]) 5 MG tablet TAKE 1 TABLET BY MOUTH TWICE A DAY 180 tablet 3  . cholecalciferol (VITAMIN D) 1000 UNITS tablet Take 1,000 Units by mouth daily.    . Cyanocobalamin (VITAMIN B-12 PO) Take 1 tablet by mouth daily.    Marland Kitchen gabapentin (NEURONTIN) 100 MG capsule TAKE 1 CAPSULE BY MOUTH THREE TIMES A DAY (Patient taking differently: Take 100 mg by mouth 2 (two) times daily. ) 270 capsule 0  . hydrocortisone (ANUSOL-HC) 2.5 % rectal cream Place 1 application rectally at bedtime. 30 g 0  . ketoconazole (NIZORAL) 2 % cream Apply 1 application topically daily. 15 g 0  . levofloxacin (LEVAQUIN) 250 MG tablet Take 1 tablet (250 mg total) by mouth daily. 7 tablet 0  . loperamide (  IMODIUM) 2 MG capsule Take 2 mg by mouth daily as needed for diarrhea or loose stools.     . NONFORMULARY OR COMPOUNDED ITEM Antifungal solution: Terbinafine 3%, Fluconazole 2%, Tea Tree Oil 5%, Urea 10%, Ibuprofen 2% in DMSO suspension #77mL 1 each 3  . omeprazole (PRILOSEC) 20 MG capsule Take 20 mg by mouth daily as needed.     . sertraline (ZOLOFT) 25 MG tablet TAKE 1 TABLET BY MOUTH EVERY DAY 90 tablet 0  . triamcinolone cream (KENALOG) 0.1 % Apply 1 application topically 2 (two) times daily. For 7-10 days maximum 80 g 0  . XARELTO 15 MG TABS tablet TAKE 1 TABLET BY MOUTH EVERY DAY WITH SUPPER 30 tablet 10   No current facility-administered medications on file prior to visit.    ALLERGIES: Allergies  Allergen Reactions  . Ativan [Lorazepam] Other (See Comments)    Severe hallucinations   . Propoxyphene Hcl Nausea And Vomiting  . Codeine Itching  . Penicillins Rash    Has patient had a PCN reaction causing immediate rash,  facial/tongue/throat swelling, SOB or lightheadedness with hypotension: Yes Has patient had a PCN reaction causing severe rash involving mucus membranes or skin necrosis: Unk Has patient had a PCN reaction that required hospitalization: No Has patient had a PCN reaction occurring within the last 10 years: Yes If all of the above answers are "NO", then may proceed with Cephalosporin use.   . Vancomycin Rash    FAMILY HISTORY: Family History  Problem Relation Age of Onset  . Colon cancer Sister        colon  . Hyperlipidemia Mother   . Stroke Mother   . Cancer Father        mesothelioma   SOCIAL HISTORY: Social History   Socioeconomic History  . Marital status: Married    Spouse name: Percell Miller  . Number of children: 6  . Years of education: Not on file  . Highest education level: Not on file  Occupational History  . Occupation: retired    Fish farm manager: RETIRED  Tobacco Use  . Smoking status: Former Smoker    Packs/day: 2.50    Years: 37.00    Pack years: 92.50    Types: Cigarettes    Quit date: 09/05/1984    Years since quitting: 35.4  . Smokeless tobacco: Never Used  . Tobacco comment: quit when she was dx with cancer  Vaping Use  . Vaping Use: Never used  Substance and Sexual Activity  . Alcohol use: No    Alcohol/week: 0.0 standard drinks  . Drug use: No  . Sexual activity: Not Currently  Other Topics Concern  . Not on file  Social History Narrative   Married (husband patient of Dr. Yong Channel). 6 children (lost 2 for total 8). 15 grandkids. 6 greatgrandchildren.    Lives with husband. Husband drives. Patient does not drive.       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    Social Determinants of Health   Financial Resource Strain:   . Difficulty of Paying Living Expenses:   Food Insecurity:   . Worried About Charity fundraiser in the Last Year:   . Arboriculturist in the Last Year:   Transportation Needs:    . Film/video editor (Medical):   Marland Kitchen Lack of Transportation (Non-Medical):   Physical Activity:   . Days of Exercise per Week:   .  Minutes of Exercise per Session:   Stress:   . Feeling of Stress :   Social Connections:   . Frequency of Communication with Friends and Family:   . Frequency of Social Gatherings with Friends and Family:   . Attends Religious Services:   . Active Member of Clubs or Organizations:   . Attends Archivist Meetings:   Marland Kitchen Marital Status:   Intimate Partner Violence:   . Fear of Current or Ex-Partner:   . Emotionally Abused:   Marland Kitchen Physically Abused:   . Sexually Abused:     PHYSICAL EXAM: Blood pressure (!) 145/69, pulse 70, height 5\' 4"  (1.626 m), weight 192 lb (87.1 kg), SpO2 91 %. General: No acute distress.  Patient appears well-groomed.   Head:  Normocephalic/atraumatic Eyes:  Fundi examined but not visualized Neck: supple, no paraspinal tenderness, full range of motion Heart:  Regular rate and rhythm Lungs:  Clear to auscultation bilaterally Back: No paraspinal tenderness Neurological Exam:  [employer] Mental Exam 02/06/2020  Weekday Correct 1  Current year 1  What state are we in? 1  Amount spent 0  Amount left 0  # of Animals 0  5 objects recall 3  Number series 0  Hour markers 0  Time correct 2  Placed X in triangle correctly 0  Largest Figure 1  Name of female 0  Date back to work 0  Type of work 0  State she lived in 2  Total score 11   CN II-XII intact. Bulk and tone normal, muscle strength 5/5 throughout.  Sensation to light touch  intact.  Deep tendon reflexes absent throughout.  Finger to nose testing intact.  Wide-based cautious gait with short stride.Marland Kitchen  IMPRESSION: 1.  Significant anger, multifactorial.  May be related to depression, chronic pain from lumbar spinal stenosis as well as underlying neurocognitive disorder as well as levetiracetam.  I previously tried to transition her from levetiracetam to  lamotrigine but she did not do well with that change.  I would favor trying to transition to another antiepileptic medication, specifically divalproex, which is a mood stabilizer as well. 2.  Localization-related symptomatic epilepsy, stable 3.  History of cardioembolic stroke.    PLAN: 1.  Start divalproex ER 250mg  at bedtime for one week, then increase to 500mg  at bedtime 2.  Taper Keppra to 500mg  in AM and 1000mg  at night for a week, then 500mg  twice daily for a week, then STOP 3.  To reduce polypharmacy, will have her discontinue gabapentin for now as it is not effective. 4.  Check CBC and CMP in 3 months. 5.  She will continue sertraline 6.  Follow up in 4 months.  Total time spent with patient and reviewing records:  60 minutes  Metta Clines, DO  CC: [name], MD

## 2020-02-06 NOTE — Patient Instructions (Addendum)
Levetiracetam (Keppra) may cause irritability.  Therefore, I would like to try again to switch you from [place] to another seizure medication.  I would like to start you on Depakote (divalproex) which is a mood stabilizer as well as a seizure medication.  1.  Start Depakote ER (divalproex) 250mg  pill.  Take 1 pill at bedtime for 7 days, then increase to 2 pills at bedtime. 2.  At the same time, decrease levetiracetram to:  - 1/2 tablet in morning and 1 tablet at night for one week,   -  then 1/2 tablet twice daily for one week  -  Then STOP. 3.  Check CBC and CMP in 3 months. 4.  If gabapentin not helpful at all, I would discontinue it for now to limit number of medications. 5.  Follow up in 4 months.

## 2020-02-08 ENCOUNTER — Telehealth: Payer: Self-pay | Admitting: Neurology

## 2020-02-08 MED ORDER — CITALOPRAM HYDROBROMIDE 10 MG PO TABS
10.0000 mg | ORAL_TABLET | Freq: Every day | ORAL | 0 refills | Status: DC
Start: 2020-02-08 — End: 2020-03-22

## 2020-02-08 MED ORDER — LEVETIRACETAM 1000 MG PO TABS: 1000.0000 mg | ORAL_TABLET | Freq: Two times a day (BID) | ORAL | 0 refills | Status: DC

## 2020-02-08 NOTE — Telephone Encounter (Signed)
Spoke with pts husband and told him, per Dr Tomi Likens, that it is okay for pt to cont on levetiracetam and not start the divalproex. He would also, because of pts irritability, like pt to discontinue the sertraline and start citalopram 10 mg daily with an increase in dose after 6 weeks if pt tolerating which he agreed was something they'd be fine with trying. Will send new prescription to pharmacy.

## 2020-02-08 NOTE — Telephone Encounter (Signed)
Patient called and wanting to speak with a nurse about whether his wife needs labs at this time with the medication changes recently. See telephone note from earlier today for further reference.

## 2020-02-08 NOTE — Telephone Encounter (Signed)
pts husband called to ask if she still needs the labs drawn that Dr Tomi Likens ordered even if she isn't going to change medications, I inst him that she should still get labs drawn that were ordered, he verbalized understanding.

## 2020-02-08 NOTE — Telephone Encounter (Signed)
Then she may remain on levetiracetam (do not start divalproex).  To help with irritability, I would discontinue sertraline and instead start citalopram 10mg  daily.  We can increase dose in 6 weeks if needed.

## 2020-02-08 NOTE — Telephone Encounter (Signed)
[facility] 02/08/20 @ 12:15PM:  "Caller states his wife is having second thoughts about changing her medication around and they have decided against the change. Caller states they want to stay with the same stroke medication she has been taking."

## 2020-03-19 ENCOUNTER — Telehealth: Payer: Self-pay | Admitting: Neurology

## 2020-03-19 NOTE — Telephone Encounter (Signed)
Spoke with pts husband and told him that Dr Tomi Likens wants pt to take both medications for now, he verbalized understanding.

## 2020-03-19 NOTE — Telephone Encounter (Signed)
Patient's husband called in. He said they just recently filled the citalopram. He would like to find out if she should stop taking the buspirone and begin taking the citalopram or just add the citalopram?

## 2020-03-19 NOTE — Telephone Encounter (Signed)
The citalopram is an add-on.  Continue buspirone for now.

## 2020-03-20 ENCOUNTER — Other Ambulatory Visit: Payer: Self-pay | Admitting: Family Medicine

## 2020-03-21 ENCOUNTER — Other Ambulatory Visit: Payer: Self-pay | Admitting: Neurology

## 2020-03-27 ENCOUNTER — Encounter: Payer: Self-pay | Admitting: Podiatry

## 2020-03-27 ENCOUNTER — Telehealth: Payer: Self-pay | Admitting: Family Medicine

## 2020-03-27 ENCOUNTER — Ambulatory Visit (INDEPENDENT_AMBULATORY_CARE_PROVIDER_SITE_OTHER): Payer: Medicare Other | Admitting: Podiatry

## 2020-03-27 ENCOUNTER — Other Ambulatory Visit: Payer: Self-pay

## 2020-03-27 DIAGNOSIS — M2042 Other hammer toe(s) (acquired), left foot: Secondary | ICD-10-CM

## 2020-03-27 DIAGNOSIS — M2041 Other hammer toe(s) (acquired), right foot: Secondary | ICD-10-CM

## 2020-03-27 DIAGNOSIS — M79674 Pain in right toe(s): Secondary | ICD-10-CM | POA: Diagnosis not present

## 2020-03-27 DIAGNOSIS — B351 Tinea unguium: Secondary | ICD-10-CM

## 2020-03-27 DIAGNOSIS — R531 Weakness: Secondary | ICD-10-CM

## 2020-03-27 DIAGNOSIS — M79675 Pain in left toe(s): Secondary | ICD-10-CM | POA: Diagnosis not present

## 2020-03-27 NOTE — Telephone Encounter (Signed)
Patients husband called and want wanted to discuss Physical therapy for patient.  they made an appointment to discuss just in case or if he would just put in referral for patient. Thank you.

## 2020-03-28 NOTE — Telephone Encounter (Signed)
error 

## 2020-03-28 NOTE — Addendum Note (Signed)
Addended by: Marian Sorrow on: 03/28/2020 01:09 PM   Modules accepted: Orders

## 2020-03-28 NOTE — Telephone Encounter (Signed)
Spoke to pt's husband told him Dr. Yong Channel said he will gladly place a referral for PT and if not improving then follow up with him. Mr. Marek verbalized understanding. Told him I will place the referral and someone will contact you to schedule an appt and I can cancel the appt on Sept 10th if you like. Mr. Vanderschaaf verbalized understanding and said keep appt for now. Told him okay. Referral has been placed in Epic.

## 2020-03-28 NOTE — Telephone Encounter (Signed)
Most likely I would be okay with simply placing a physical therapy referral and then following up with me if not improving

## 2020-03-28 NOTE — Telephone Encounter (Signed)
Pt has app to see you on 9/10. Do you want to hold off until then or want me to call and get info of why needs pt and c/a app?

## 2020-03-29 ENCOUNTER — Other Ambulatory Visit: Payer: Self-pay | Admitting: Neurology

## 2020-03-31 NOTE — Progress Notes (Signed)
Subjective:  Patient ID: [name], female    DOB: [birth year],  MRN: [id number]  86 y.o. female presents with painful thick toenails that are difficult to trim. Pain interferes with ambulation. Aggravating factors include wearing enclosed shoe gear. Pain is relieved with periodic professional debridement.   She has h/o memory loss. She is accompanied by her husband who also has an appointment today.   [name] is also on blood thinner, Xarelto.  PCP is [name]. [facility] and last visit was on 12/14/2019.  Review of Systems: Negative except as noted in the HPI.  Past Medical History:  Diagnosis Date  . ABDOMINAL INCISIONAL HERNIA 04/03/2010      . Anxiety   . Arthritis    "lots; hands" (05/26/2013)  . Blood transfusion    "w/1st miscarriage" (05/26/2013)  . COPD (chronic obstructive pulmonary disease) ([place])   . Coronary artery disease   . Depression   . Diverticulitis of colon with hemorrhage 07/24/2008  . DVT (deep venous thrombosis) ([place]) 1954   "after I had my first child" (05/26/2013)  . Exertional shortness of breath   . Family history of anesthesia complication    "sisters also get sick as a dog" (05/26/2013)  . GERD 05/03/2007      . GERD (gastroesophageal reflux disease)   . H/O hiatal hernia   . HCAP (healthcare-associated pneumonia) 06/26/2013  . Heart murmur    "when I was a child" (05/26/2013)  . History of TIAs   . Hyperlipidemia   . Hypertension   . Keratotic plaque   . Laryngeal carcinoma ([place]) hx of ca   remotely with resection and xrt/chronic hoarsemenss  . Lymphona, mantle cell, inguinal region/lower limb ([place])    "large c-cell" (05/26/2013)  . OSTEOARTHRITIS 05/03/2007  . PEPTIC ULCER DISEASE 07/06/2008  . Pneumonia 2013   "once" (05/26/2013)  . PUD (peptic ulcer disease)   . PVD (peripheral vascular disease) ([place])   . Seizures ([place])   . Stroke ([place]) 05/26/2013   "very mild; affected my speech for a while" (05/26/2013)  . VARICOSE VEINS  LOWER EXTREMITIES W/INFLAMMATION 01/15/2009   Past Surgical History:  Procedure Laterality Date  . ABDOMINAL EXPLORATION SURGERY  12/01/2012  . APPENDECTOMY  09/13/2008  . CAROTID ENDARTERECTOMY Bilateral 2004   2004 a month apart right and left  . CARPAL TUNNEL RELEASE Left ?1960's  . CATARACT EXTRACTION  2005   bilateral  . COLON SURGERY  09/13/2008   Laparoscopic assisted converted to open sigmoid colectomy.[name] 09/13/2008 (05/26/2013)  . DILATION AND CURETTAGE OF UTERUS  1950's   "had 2 for miscarriages" (05/26/2013)  . DIVERTING ILEOSTOMY  09/22/2008   iliostomy for diverticulitis/notes 09/22/2008 (05/26/2013)  . HERNIA REPAIR  05/2010   ventral hernia repair  . ILEOSTOMY CLOSURE  01/2010  . RADICAL NECK DISSECTION  1986   "larynx cancer" (05/26/2013)  . TONSILLECTOMY AND ADENOIDECTOMY  1936   "tonsils grew back; no 2nd OR" (05/26/2013)  . TOTAL KNEE ARTHROPLASTY Bilateral    2002 left 2008 right  . TUBAL LIGATION  1972   Patient Active Problem List   Diagnosis Date Noted  . Memory loss 12/14/2019  . Recurrent major depressive disorder, in full remission ([place]) 10/16/2019  . Loose stools 06/01/2019  . Hemorrhoids 06/01/2019  . Chest pain 04/25/2018  . Agitation 03/11/2018  . Chronic diarrhea 09/04/2017  . History of chronic respiratory failure- prior on o2 06/15/2017  . Aortic atherosclerosis ([place]) 06/30/2016  . Diastolic CHF ([place]) 85/27/7824  . Bacteremia  due to Streptococcus 11/21/2015  . Cellulitis of leg, right 11/20/2015  . History of cardioembolic stroke 47/04/6282  . Diverticulitis of colon 09/26/2014  . CKD (chronic kidney disease), stage III ([place]) 09/26/2014  . Laryngeal carcinoma ([place])   . Spinal stenosis, lumbar region, with neurogenic claudication 05/09/2014  . Weakness generalized 06/30/2013  . History of Focal seizure ([place]) 06/16/2013  . History of TIA (transient ischemic attack) 05/31/2013  . Bilateral hand pain 01/09/2013  . History of Large B-cell  lymphoma  02/18/2012  . Atrial fibrillation ([place]) 12/14/2008  . Anxiety state 10/20/2008  . Depression 10/20/2008  . Dyspnea on exertion 07/06/2008  . COPD GOLD II 03/26/2008  . GERD (gastroesophageal reflux disease) 05/03/2007  . Hyperlipidemia 03/18/2007  . Essential hypertension 03/18/2007    Current Outpatient Medications:  .  albuterol (VENTOLIN HFA) 108 (90 Base) MCG/ACT inhaler, TAKE 2 PUFFS BY MOUTH EVERY 6 HOURS AS NEEDED FOR WHEEZE OR SHORTNESS OF BREATH, Disp: 18 g, Rfl: 1 .  atorvastatin (LIPITOR) 20 MG tablet, TAKE 1 TABLET BY MOUTH EVERY DAY, Disp: 90 tablet, Rfl: 1 .  bisoprolol (ZEBETA) 5 MG tablet, TAKE 1 TABLET BY MOUTH EVERY DAY, Disp: 90 tablet, Rfl: 1 .  busPIRone ([name]) 5 MG tablet, TAKE 1 TABLET BY MOUTH TWICE A DAY, Disp: 180 tablet, Rfl: 3 .  cholecalciferol (VITAMIN D) 1000 UNITS tablet, Take 1,000 Units by mouth daily., Disp: , Rfl:  .  citalopram (CELEXA) 10 MG tablet, TAKE 1 TABLET BY MOUTH EVERY DAY, Disp: 90 tablet, Rfl: 1 .  Cyanocobalamin (VITAMIN B-12 PO), Take 1 tablet by mouth daily., Disp: , Rfl:  .  gabapentin (NEURONTIN) 100 MG capsule, TAKE 1 CAPSULE BY MOUTH THREE TIMES A DAY (Patient taking differently: Take 100 mg by mouth 2 (two) times daily. ), Disp: 270 capsule, Rfl: 0 .  hydrocortisone (ANUSOL-HC) 2.5 % rectal cream, Place 1 application rectally at bedtime., Disp: 30 g, Rfl: 0 .  ketoconazole (NIZORAL) 2 % cream, Apply 1 application topically daily., Disp: 15 g, Rfl: 0 .  levETIRAcetam (KEPPRA) 1000 MG tablet, Take 1 tablet (1,000 mg total) by mouth 2 (two) times daily., Disp: 180 tablet, Rfl: 0 .  levofloxacin (LEVAQUIN) 250 MG tablet, Take 1 tablet (250 mg total) by mouth daily., Disp: 7 tablet, Rfl: 0 .  loperamide (IMODIUM) 2 MG capsule, Take 2 mg by mouth daily as needed for diarrhea or loose stools. , Disp: , Rfl:  .  NONFORMULARY OR COMPOUNDED ITEM, Antifungal solution: Terbinafine 3%, Fluconazole 2%, Tea Tree Oil 5%, Urea 10%,  Ibuprofen 2% in DMSO suspension #74mL, Disp: 1 each, Rfl: 3 .  omeprazole (PRILOSEC) 20 MG capsule, Take 20 mg by mouth daily as needed. , Disp: , Rfl:  .  triamcinolone cream (KENALOG) 0.1 %, Apply 1 application topically 2 (two) times daily. For 7-10 days maximum, Disp: 80 g, Rfl: 0 .  XARELTO 15 MG TABS tablet, TAKE 1 TABLET BY MOUTH EVERY DAY WITH SUPPER, Disp: 30 tablet, Rfl: 10 Allergies  Allergen Reactions  . Ativan [Lorazepam] Other (See Comments)    Severe hallucinations   . Propoxyphene Hcl Nausea And Vomiting  . Codeine Itching  . Penicillins Rash    Has patient had a PCN reaction causing immediate rash, facial/tongue/throat swelling, SOB or lightheadedness with hypotension: Yes Has patient had a PCN reaction causing severe rash involving mucus membranes or skin necrosis: Unk Has patient had a PCN reaction that required hospitalization: No Has patient had a PCN reaction occurring  within the last 10 years: Yes If all of the above answers are "NO", then may proceed with Cephalosporin use.   . Vancomycin Rash   Social History   Occupational History  . Occupation: retired    Fish farm manager: RETIRED  Tobacco Use  . Smoking status: Former Smoker    Packs/day: 2.50    Years: 37.00    Pack years: 92.50    Types: Cigarettes    Quit date: 09/05/1984    Years since quitting: 35.5  . Smokeless tobacco: Never Used  . Tobacco comment: quit when she was dx with cancer  Vaping Use  . Vaping Use: Never used  Substance and Sexual Activity  . Alcohol use: No    Alcohol/week: 0.0 standard drinks  . Drug use: No  . Sexual activity: Not Currently    Objective:   Constitutional Pt is a pleasant 86 y.o. Caucasian female WD, WN in NAD.Marland Kitchen AAO x 3.   Vascular Capillary refill time to digits immediate b/l. Palpable pedal pulses b/l LE. Pedal hair present. Lower extremity skin temperature gradient within normal limits. No pain with calf compression b/l. No edema noted b/l lower extremities.  Varicosities present b/l. No cyanosis or clubbing noted.  Neurologic Normal speech. Oriented to person, place, and time. Protective sensation intact 5/5 intact bilaterally with 10g monofilament b/l. Vibratory sensation intact b/l.  Dermatologic Pedal skin with normal turgor, texture and tone bilaterally. No open wounds bilaterally. No interdigital macerations bilaterally. Toenails 1-5 b/l elongated, discolored, dystrophic, thickened, crumbly with subungual debris and tenderness to dorsal palpation.  Orthopedic: Normal muscle strength 5/5 to all lower extremity muscle groups bilaterally. No pain crepitus or joint limitation noted with ROM b/l. Hammertoes noted to the 2-5 bilaterally.   Radiographs: None Assessment:   1. Pain due to onychomycosis of toenails of both feet   2. Acquired hammertoes of both feet    Plan:  Patient was evaluated and treated and all questions answered.  Onychomycosis with pain -Nails palliatively debridement as below. -Educated on self-care  Procedure: Nail Debridement Rationale: Pain Type of Debridement: manual, sharp debridement. Instrumentation: Nail nipper, rotary burr. Number of Nails: 10  -Examined patient. -No new findings. No new orders. -Toenails 1-5 b/l were debrided in length and girth with sterile nail nippers and dremel without iatrogenic bleeding.  -Patient to report any pedal injuries to medical professional immediately. -Patient to continue soft, supportive shoe gear daily. -Patient/POA to call should there be question/concern in the interim.  Return in about 3 months (around 06/27/2020) for nail trim.  Marzetta Board, DPM

## 2020-04-04 ENCOUNTER — Other Ambulatory Visit: Payer: Self-pay | Admitting: Neurology

## 2020-04-05 ENCOUNTER — Other Ambulatory Visit: Payer: Self-pay | Admitting: Neurology

## 2020-04-07 ENCOUNTER — Other Ambulatory Visit: Payer: Self-pay | Admitting: Family Medicine

## 2020-04-09 ENCOUNTER — Telehealth: Payer: Self-pay

## 2020-04-09 NOTE — Telephone Encounter (Signed)
Spoke with the husband, citalopram 10mg  daily making the pt more irritable and sleepy since she starting taking it.    Please advise what they should Do?

## 2020-04-09 NOTE — Telephone Encounter (Signed)
I would stop citalopram and start escitalopram (Lexapro) 5mg  daily.  We can increase dose in 8 weeks if needed.

## 2020-04-09 NOTE — Telephone Encounter (Signed)
Telephone call to pt husband, Pt will continue on [place] for alone and see how she does. If they think she needs to add Lexapro then they will give [place] call back.

## 2020-04-10 NOTE — Patient Instructions (Addendum)
Health Maintenance Due  Topic Date Due  . INFLUENZA VACCINE -high dose flu shot today 03/03/2020   We will call you within two weeks about your referral to home health physical therapy. If you do not hear within 3 weeks, give [place] a call.   Restart gabapentin at night before bed. Can increase to twice a daily after a week and up to 3 a day a week after that.   Please stop by lab before you go If you have mychart- we will send your results within 3 business days of [place] receiving them.  If you do not have mychart- we will call you about results within 5 business days of [place] receiving them.  *please note we are currently using [facility] which has a longer processing time than [facility] typically so labs may not come back as quickly as in the past *please also note that you will see labs on mychart as soon as they post. I will later go in and write notes on them- will say "notes from Dr. Yong Channel"

## 2020-04-10 NOTE — Progress Notes (Signed)
Phone [phone] In person visit   Subjective:   [name] is a 86 y.o. year old very pleasant female patient who presents for/with See problem oriented charting Chief Complaint  Patient presents with  . PT Initial Evaluation   This visit occurred during the SARS-CoV-2 public health emergency.  Safety protocols were in place, including screening questions prior to the visit, additional usage of staff PPE, and extensive cleaning of exam room while observing appropriate contact time as indicated for disinfecting solutions.   Past Medical History-  Patient Active Problem List   Diagnosis Date Noted  . Diastolic CHF ([place]) 48/54/6270    Priority: High  . History of cardioembolic stroke 35/00/9381    Priority: High  . Spinal stenosis, lumbar region, with neurogenic claudication 05/09/2014    Priority: High  . History of Focal seizure ([place]) 06/16/2013    Priority: High  . History of TIA (transient ischemic attack) 05/31/2013    Priority: High  . History of Large B-cell lymphoma  02/18/2012    Priority: High  . Atrial fibrillation ([place]) 12/14/2008    Priority: High  . Agitation 03/11/2018    Priority: Medium  . History of chronic respiratory failure- prior on o2 06/15/2017    Priority: Medium  . Bacteremia due to Streptococcus 11/21/2015    Priority: Medium  . Cellulitis of leg, right 11/20/2015    Priority: Medium  . Diverticulitis of colon 09/26/2014    Priority: Medium  . CKD (chronic kidney disease), stage III ([place]) 09/26/2014    Priority: Medium  . Laryngeal carcinoma ([place])     Priority: Medium  . Anxiety state 10/20/2008    Priority: Medium  . Depression 10/20/2008    Priority: Medium  . Dyspnea on exertion 07/06/2008    Priority: Medium  . COPD GOLD II 03/26/2008    Priority: Medium  . Hyperlipidemia 03/18/2007    Priority: Medium  . Essential hypertension 03/18/2007    Priority: Medium  . Chronic diarrhea 09/04/2017    Priority: Low  . Aortic  atherosclerosis ([place]) 06/30/2016    Priority: Low  . Weakness generalized 06/30/2013    Priority: Low  . Bilateral hand pain 01/09/2013    Priority: Low  . Memory loss 12/14/2019  . Recurrent major depressive disorder, in full remission ([place]) 10/16/2019  . Loose stools 06/01/2019  . Hemorrhoids 06/01/2019  . Chest pain 04/25/2018  . GERD (gastroesophageal reflux disease) 05/03/2007    Medications- reviewed and updated Current Outpatient Medications  Medication Sig Dispense Refill  . atorvastatin (LIPITOR) 20 MG tablet TAKE 1 TABLET BY MOUTH EVERY DAY 90 tablet 1  . bisoprolol (ZEBETA) 5 MG tablet TAKE 1 TABLET BY MOUTH EVERY DAY 90 tablet 1  . busPIRone ([name]) 5 MG tablet TAKE 1 TABLET BY MOUTH TWICE A DAY 180 tablet 3  . cholecalciferol (VITAMIN D) 1000 UNITS tablet Take 1,000 Units by mouth daily.    . Cyanocobalamin (VITAMIN B-12 PO) Take 1 tablet by mouth daily.    . hydrocortisone (ANUSOL-HC) 2.5 % rectal cream Place 1 application rectally at bedtime. 30 g 0  . ketoconazole (NIZORAL) 2 % cream Apply 1 application topically daily. 15 g 0  . levETIRAcetam (KEPPRA) 1000 MG tablet Take 1 tablet (1,000 mg total) by mouth 2 (two) times daily. Accidentally discontinued by Dr. Yong Channel. Reordered as no print. rx from Dr. Tomi Likens 180 tablet 0  . loperamide (IMODIUM) 2 MG capsule Take 2 mg by mouth daily as needed for diarrhea or loose  stools.     . NONFORMULARY OR COMPOUNDED ITEM Antifungal solution: Terbinafine 3%, Fluconazole 2%, Tea Tree Oil 5%, Urea 10%, Ibuprofen 2% in DMSO suspension #36mL 1 each 3  . omeprazole (PRILOSEC) 20 MG capsule Take 20 mg by mouth daily as needed.     . triamcinolone cream (KENALOG) 0.1 % Apply 1 application topically 2 (two) times daily. For 7-10 days maximum 80 g 0  . XARELTO 15 MG TABS tablet TAKE 1 TABLET BY MOUTH EVERY DAY WITH SUPPER 30 tablet 10  . albuterol (VENTOLIN HFA) 108 (90 Base) MCG/ACT inhaler TAKE 2 PUFFS BY MOUTH EVERY 6 HOURS AS NEEDED FOR  WHEEZE OR SHORTNESS OF BREATH (Patient not taking: Reported on 04/12/2020) 18 g 1  . gabapentin (NEURONTIN) 100 MG capsule TAKE 1 CAPSULE BY MOUTH THREE TIMES A DAY (Patient not taking: Reported on 04/12/2020) 270 capsule 0   No current facility-administered medications for this visit.     Objective:  BP 115/68   Pulse 70   Temp (!) 97.3 F (36.3 C) (Temporal)   Ht 5\' 4"  (1.626 m)   Wt 186 lb 12.8 oz (84.7 kg)   LMP  (LMP Unknown)   SpO2 91%   BMI 32.06 kg/m  Gen: NAD, resting comfortably CV: RRR no murmurs rubs or gallops Lungs: CTAB no crackles, wheeze, rhonchi Ext: no edema Skin: warm, dry Neuro: Walks with walker, tangential in speech at times    Assessment and Plan   # multiple concerns- Discuss PT/imbalance issues/mood concerns S: she states she is discouraged because cannot read after her prior stroke. We did discuss audio books and using headphones as she like romance novels but embarrasses her. Doesn't really enjoy sewing as much and vision affects thins.   She gets some tremulousness. She also has left shoulder pain- tyelnol is given but not sure how much it helps. She took a few days of celexa prescribed by Dr. Tomi Likens) but felt more aggravated so she stopped it. She also has stopped the gabapentin. More back pain and shoulder pain off gabapentin. Still taking buspirone but perhaps just once a day.   Also doesn't feel as stable on her feet. Requesting physical therapy   A/P: From anxiety and mood perspective patient did not tolerate Celexa with worsening anxiety.  She is also come off her gabapentin and her pain levels seem to be higher since that happened-agitation also may be higher.  Gabapentin can also sometimes help with anxiety and we recommended restarting this-see after visit summary.  She can continue buspirone twice daily though sounds like she is only taking once a day for the most part  For her instability of gait-she is using her walker consistently.  I did  recommend home health physical therapy to further help with support-she reports this has been helpful in the past  #hyperlipidemia/history of stroke S: Medication: Atorvastatin 20 mg daily, Eliquis 15 mg daily for stroke prevention with history of A. fib Lab Results  Component Value Date   CHOL 96 04/20/2019   HDL 30.80 (L) 04/20/2019   LDLCALC 90 08/07/2014   LDLDIRECT 40.0 04/20/2019   TRIG 266.0 (H) 04/20/2019   CHOLHDL 3 04/20/2019   A/P: LDL well controlled on last check-too soon for full lipid panel.  We will update uric LDL with labs today.  For history of cardioembolic stroke-continue Eliquis   #hypertension S: medication: Bisoprolol 5 mg BP Readings from Last 3 Encounters:  04/12/20 115/68  02/06/20 (!) 145/69  12/22/19 (!) 118/54  A/P: Blood pressure well controlled today-continue current medications  Recommended follow up: Return in about 4 months (around 08/12/2020) for physical or sooner if needed. Future Appointments  Date Time Provider [place]  04/18/2020  1:45 PM [facility] HEALTH COACH [facility]  05/17/2020 11:30 AM Ladell Pier, MD [facility] None  07/04/2020  1:30 PM Pieter Partridge, DO [facility] None  07/10/2020 10:45 AM Marzetta Board, DPM [facility]   Lab/Order associations:   ICD-10-CM   1. Gait abnormality  R26.9 Ambulatory referral to [facility]  2. Imbalance  R26.89 Ambulatory referral to [place]  3. Pure hypercholesterolemia  E78.00 CBC With Differential/Platelet    COMPLETE METABOLIC PANEL WITH GFR    LDL cholesterol, direct  4. Essential hypertension  I10     Meds ordered this encounter  Medications  . levETIRAcetam (KEPPRA) 1000 MG tablet    Sig: Take 1 tablet (1,000 mg total) by mouth 2 (two) times daily. Accidentally discontinued by Dr. Yong Channel. Reordered as no print. rx from Dr. Tomi Likens    Dispense:  180 tablet    Refill:  0  . hydrocortisone (ANUSOL-HC) 2.5 % rectal cream    Sig: Place 1 application rectally  at bedtime.    Dispense:  30 g    Refill:  0   Return precautions advised.  [name], MD

## 2020-04-11 ENCOUNTER — Ambulatory Visit: Payer: Medicare Other

## 2020-04-12 ENCOUNTER — Encounter: Payer: Self-pay | Admitting: Family Medicine

## 2020-04-12 ENCOUNTER — Other Ambulatory Visit: Payer: Self-pay

## 2020-04-12 ENCOUNTER — Ambulatory Visit: Payer: Medicare Other | Admitting: Family Medicine

## 2020-04-12 VITALS — BP 115/68 | HR 70 | Temp 97.3°F | Ht 64.0 in | Wt 186.8 lb

## 2020-04-12 DIAGNOSIS — E7800 Pure hypercholesterolemia, unspecified: Secondary | ICD-10-CM

## 2020-04-12 DIAGNOSIS — R269 Unspecified abnormalities of gait and mobility: Secondary | ICD-10-CM

## 2020-04-12 DIAGNOSIS — R2689 Other abnormalities of gait and mobility: Secondary | ICD-10-CM

## 2020-04-12 DIAGNOSIS — Z23 Encounter for immunization: Secondary | ICD-10-CM | POA: Diagnosis not present

## 2020-04-12 DIAGNOSIS — I10 Essential (primary) hypertension: Secondary | ICD-10-CM | POA: Diagnosis not present

## 2020-04-12 MED ORDER — HYDROCORTISONE (PERIANAL) 2.5 % EX CREA: 1.0000 "application " | TOPICAL_CREAM | Freq: Every day | CUTANEOUS | 0 refills | Status: DC

## 2020-04-12 MED ORDER — LEVETIRACETAM 1000 MG PO TABS: 1000.0000 mg | ORAL_TABLET | Freq: Two times a day (BID) | ORAL | 0 refills | Status: DC

## 2020-04-14 ENCOUNTER — Other Ambulatory Visit: Payer: Self-pay | Admitting: Family Medicine

## 2020-04-15 ENCOUNTER — Other Ambulatory Visit: Payer: Self-pay | Admitting: Family Medicine

## 2020-04-15 MED ORDER — KETOCONAZOLE 2 % EX CREA: 1.0000 "application " | TOPICAL_CREAM | Freq: Every day | CUTANEOUS | 0 refills | Status: DC

## 2020-04-18 ENCOUNTER — Ambulatory Visit: Payer: Medicare Other

## 2020-04-24 ENCOUNTER — Telehealth: Payer: Self-pay | Admitting: Family Medicine

## 2020-04-24 NOTE — Telephone Encounter (Signed)
Mickel Baas from [facility] called requesting orders for pt. Pt was seen today for PT Eval. Requesting orders for PT for 2 x 4 weeks, 1 x 4 weeks. They are also requesting an order for OT. Please advise. Ok to LVM.

## 2020-04-25 ENCOUNTER — Other Ambulatory Visit: Payer: Self-pay | Admitting: Neurology

## 2020-04-25 ENCOUNTER — Telehealth: Payer: Self-pay | Admitting: Oncology

## 2020-04-25 NOTE — Telephone Encounter (Signed)
Rescheduled appointment per provider PAL schedule. Called patient who is aware of updated appointment date and time.

## 2020-04-25 NOTE — Telephone Encounter (Signed)
Called and lm on Mickel Baas vm providing VO and left office number if any questions.

## 2020-04-30 ENCOUNTER — Ambulatory Visit: Payer: Medicare Other | Admitting: Neurology

## 2020-05-01 ENCOUNTER — Ambulatory Visit: Payer: Medicare Other | Admitting: Physical Therapy

## 2020-05-12 ENCOUNTER — Other Ambulatory Visit: Payer: Self-pay | Admitting: Neurology

## 2020-05-14 ENCOUNTER — Telehealth: Payer: Self-pay

## 2020-05-14 NOTE — Telephone Encounter (Signed)
See below

## 2020-05-14 NOTE — Telephone Encounter (Signed)
Pt was sent to Urgent Care.

## 2020-05-14 NOTE — Telephone Encounter (Signed)
See other message

## 2020-05-14 NOTE — Telephone Encounter (Signed)
Team please make sure patient was evaluated

## 2020-05-14 NOTE — Telephone Encounter (Signed)
Mel Almond is calling in from [facility] stating that [name]'s BP is 82/42 has been having dizziness all morning long, needing to know if she needs to take her other blood pressure pill that is due right at noon.

## 2020-05-14 NOTE — Telephone Encounter (Signed)
Nurse Assessment Nurse: Chestine Spore, RN, [place] Date/Time (Eastern Time): 05/14/2020 12:04:17 PM Confirm and document reason for call. If symptomatic, describe symptoms. ---caller states patient having low blood pressure. BP 82/42. dizzy Does the patient have any new or worsening symptoms? ---Yes Will a triage be completed? ---Yes Related visit to physician within the last 2 weeks? ---No Does the PT have any chronic conditions? (i.e. diabetes, asthma, this includes High risk factors for pregnancy, etc.) ---Yes List chronic conditions. ---hypertension. CVA Is this a behavioral health or substance abuse call? ---No Guidelines Guideline Title Affirmed Question Affirmed Notes Nurse Date/Time (Eastern Time) Blood Pressure - Low [6] Systolic BP < 90 AND [1] dizzy, lightheaded, or weak Matherly, RN, [place] 05/14/2020 12:06:30 PM Disp. Time Eilene Ghazi Time) Disposition Final User 05/14/2020 12:02:48 PM Send to Urgent Rosalin Hawking 05/14/2020 12:20:40 PM 911 Outcome Documentation Chestine Spore, RN, [place] Reason: refusing 911 05/14/2020 12:12:19 PM Call EMS 911 Now Yes Chestine Spore, RN, [place] PLEASE NOTE: All timestamps contained within this report are represented as [place] Standard Time. CONFIDENTIALTY NOTICE: This fax transmission is intended only for the addressee. It contains information that is legally privileged, confidential or otherwise protected from use or disclosure. If you are not the intended recipient, you are strictly prohibited from reviewing, disclosing, copying using or disseminating any of this information or taking any action in reliance on or regarding this information. If you have received this fax in error, please notify us immediately by telephone so that we can arrange for its return to [place]. Phone: [phone], Toll-Free: [phone], Fax: [phone] Page: 2 of 2 Call Id: [id number] Caller Disagree/Comply Disagree Caller Understands Yes PreDisposition Call  Doctor Care Advice Given Per Guideline CALL EMS 911 NOW: * Immediate medical attention is needed. You need to hang up and call 911 (or an ambulance). CARE ADVICE given per Low Blood Pressure (Adult) guideline. FIRST AID - LIE DOWN FOR SHOCK: * Lie down with feet elevated. Comments User: Cheri Kearns, RN Date/Time Eilene Ghazi Time): 05/14/2020 12:20:14 PM attempted multiple times to encourage family to call 63. husband continues to decline "the doctor knows this happens". husband states will go to MD office instead. attempted again to stress need to go to ER via 911 without success. patient to lye down with feet elevated was recommended. called backline and spoke to office RN. RN states she will speak to MD ASAP. possibly call patients daughter as well Referrals GO TO FACILITY UNDECIDE

## 2020-05-15 DIAGNOSIS — N183 Chronic kidney disease, stage 3 unspecified: Secondary | ICD-10-CM

## 2020-05-15 DIAGNOSIS — K219 Gastro-esophageal reflux disease without esophagitis: Secondary | ICD-10-CM

## 2020-05-15 DIAGNOSIS — M549 Dorsalgia, unspecified: Secondary | ICD-10-CM | POA: Diagnosis not present

## 2020-05-15 DIAGNOSIS — I4891 Unspecified atrial fibrillation: Secondary | ICD-10-CM

## 2020-05-15 DIAGNOSIS — F3340 Major depressive disorder, recurrent, in remission, unspecified: Secondary | ICD-10-CM

## 2020-05-15 DIAGNOSIS — R2681 Unsteadiness on feet: Secondary | ICD-10-CM

## 2020-05-15 DIAGNOSIS — I2510 Atherosclerotic heart disease of native coronary artery without angina pectoris: Secondary | ICD-10-CM

## 2020-05-15 DIAGNOSIS — Z7901 Long term (current) use of anticoagulants: Secondary | ICD-10-CM

## 2020-05-15 DIAGNOSIS — H539 Unspecified visual disturbance: Secondary | ICD-10-CM | POA: Diagnosis not present

## 2020-05-15 DIAGNOSIS — M48062 Spinal stenosis, lumbar region with neurogenic claudication: Secondary | ICD-10-CM | POA: Diagnosis not present

## 2020-05-15 DIAGNOSIS — I5030 Unspecified diastolic (congestive) heart failure: Secondary | ICD-10-CM

## 2020-05-15 DIAGNOSIS — I69398 Other sequelae of cerebral infarction: Secondary | ICD-10-CM | POA: Diagnosis not present

## 2020-05-15 DIAGNOSIS — I130 Hypertensive heart and chronic kidney disease with heart failure and stage 1 through stage 4 chronic kidney disease, or unspecified chronic kidney disease: Secondary | ICD-10-CM

## 2020-05-15 DIAGNOSIS — E785 Hyperlipidemia, unspecified: Secondary | ICD-10-CM

## 2020-05-15 DIAGNOSIS — J449 Chronic obstructive pulmonary disease, unspecified: Secondary | ICD-10-CM

## 2020-05-15 NOTE — Telephone Encounter (Signed)
Spoke with patient's husband and he stated that her blood pressure has came back up since being evaluated at urgent care. Her blood pressure at present is 134/79 per Dr. Yong Channel she is ok to restart her medication. Patient's husband verbalized understanding. No other questions or concerns at this time.

## 2020-05-16 ENCOUNTER — Ambulatory Visit: Payer: Medicare Other | Admitting: Oncology

## 2020-05-17 ENCOUNTER — Ambulatory Visit: Payer: Medicare Other | Admitting: Oncology

## 2020-05-27 ENCOUNTER — Ambulatory Visit: Payer: Medicare Other | Admitting: Oncology

## 2020-06-03 ENCOUNTER — Inpatient Hospital Stay: Payer: Medicare Other | Admitting: Oncology

## 2020-06-04 ENCOUNTER — Telehealth: Payer: Self-pay | Admitting: Oncology

## 2020-06-04 NOTE — Telephone Encounter (Signed)
Called pt per 11/1 sch msg  To reschedule appt. Per patient she does not want to reschedule at the moment. She will reschedule when she is ready

## 2020-06-17 ENCOUNTER — Telehealth: Payer: Self-pay

## 2020-06-17 NOTE — Telephone Encounter (Signed)
Can I schedule pt for a same day slot next week?? She has been experiencing bad shoulder pain.

## 2020-06-17 NOTE — Telephone Encounter (Signed)
Can someone please reschedule pt for next Monday at 10? Ok per [employer]

## 2020-06-20 ENCOUNTER — Ambulatory Visit: Payer: Medicare Other | Admitting: Family Medicine

## 2020-06-23 NOTE — Progress Notes (Signed)
Phone [phone] In person visit   Subjective:   [name] is a 86 y.o. year old very pleasant female patient who presents for/with See problem oriented charting Chief Complaint  Patient presents with  . Shoulder Pain    L shoulder x3-4 weeks  . Hip Pain    Right him 2-3 weeks    This visit occurred during the SARS-CoV-2 public health emergency.  Safety protocols were in place, including screening questions prior to the visit, additional usage of staff PPE, and extensive cleaning of exam room while observing appropriate contact time as indicated for disinfecting solutions.   Past Medical History-  Patient Active Problem List   Diagnosis Date Noted  . Diastolic CHF ([place]) 76/16/0737    Priority: High  . History of cardioembolic stroke 10/62/6948    Priority: High  . Spinal stenosis, lumbar region, with neurogenic claudication 05/09/2014    Priority: High  . History of Focal seizure ([place]) 06/16/2013    Priority: High  . History of TIA (transient ischemic attack) 05/31/2013    Priority: High  . History of Large B-cell lymphoma  02/18/2012    Priority: High  . Atrial fibrillation ([place]) 12/14/2008    Priority: High  . Agitation 03/11/2018    Priority: Medium  . History of chronic respiratory failure- prior on o2 06/15/2017    Priority: Medium  . Bacteremia due to Streptococcus 11/21/2015    Priority: Medium  . Cellulitis of leg, right 11/20/2015    Priority: Medium  . Diverticulitis of colon 09/26/2014    Priority: Medium  . CKD (chronic kidney disease), stage III ([place]) 09/26/2014    Priority: Medium  . Laryngeal carcinoma ([place])     Priority: Medium  . Anxiety state 10/20/2008    Priority: Medium  . Depression 10/20/2008    Priority: Medium  . Dyspnea on exertion 07/06/2008    Priority: Medium  . COPD GOLD II 03/26/2008    Priority: Medium  . Hyperlipidemia 03/18/2007    Priority: Medium  . Essential hypertension 03/18/2007    Priority: Medium  .  Chronic diarrhea 09/04/2017    Priority: Low  . Aortic atherosclerosis ([place]) 06/30/2016    Priority: Low  . Weakness generalized 06/30/2013    Priority: Low  . Bilateral hand pain 01/09/2013    Priority: Low  . Memory loss 12/14/2019  . Recurrent major depressive disorder, in full remission ([place]) 10/16/2019  . Loose stools 06/01/2019  . Hemorrhoids 06/01/2019  . Chest pain 04/25/2018  . GERD (gastroesophageal reflux disease) 05/03/2007    Medications- reviewed and updated Current Outpatient Medications  Medication Sig Dispense Refill  . atorvastatin (LIPITOR) 20 MG tablet TAKE 1 TABLET BY MOUTH EVERY DAY 90 tablet 1  . bisoprolol (ZEBETA) 5 MG tablet TAKE 1 TABLET BY MOUTH EVERY DAY 90 tablet 1  . busPIRone ([name]) 5 MG tablet TAKE 1 TABLET BY MOUTH TWICE A DAY (Patient taking differently: 5 mg daily. ) 180 tablet 3  . cholecalciferol (VITAMIN D) 1000 UNITS tablet Take 1,000 Units by mouth daily.    . Cyanocobalamin (VITAMIN B-12 PO) Take 1 tablet by mouth daily.    . hydrocortisone (ANUSOL-HC) 2.5 % rectal cream Place 1 application rectally at bedtime. 30 g 0  . levETIRAcetam (KEPPRA) 1000 MG tablet TAKE 1 TABLET BY MOUTH TWICE A DAY 180 tablet 0  . loperamide (IMODIUM) 2 MG capsule Take 2 mg by mouth daily as needed for diarrhea or loose stools.     Alveda Reasons  15 MG TABS tablet TAKE 1 TABLET BY MOUTH EVERY DAY WITH SUPPER 30 tablet 10  . albuterol (VENTOLIN HFA) 108 (90 Base) MCG/ACT inhaler TAKE 2 PUFFS BY MOUTH EVERY 6 HOURS AS NEEDED FOR WHEEZE OR SHORTNESS OF BREATH (Patient not taking: Reported on 04/12/2020) 18 g 1  . ketoconazole (NIZORAL) 2 % cream Apply 1 application topically daily. (Patient not taking: Reported on 06/24/2020) 15 g 0  . NONFORMULARY OR COMPOUNDED ITEM Antifungal solution: Terbinafine 3%, Fluconazole 2%, Tea Tree Oil 5%, Urea 10%, Ibuprofen 2% in DMSO suspension #55mL (Patient not taking: Reported on 06/24/2020) 1 each 3  . omeprazole (PRILOSEC) 20 MG capsule  Take 20 mg by mouth daily as needed.     . predniSONE (DELTASONE) 20 MG tablet Take 2 pills for 3 days, 1 pill for 4 days 10 tablet 0  . sertraline (ZOLOFT) 25 MG tablet TAKE 1 TABLET BY MOUTH EVERY DAY (Patient not taking: Reported on 06/24/2020) 90 tablet 0  . triamcinolone cream (KENALOG) 0.1 % Apply 1 application topically 2 (two) times daily. For 7-10 days maximum (Patient not taking: Reported on 06/24/2020) 80 g 0   No current facility-administered medications for this visit.     Objective:  BP 126/74   Pulse 73   Temp 98.1 F (36.7 C) (Temporal)   Ht 5\' 4"  (1.626 m)   Wt 184 lb 3.2 oz (83.6 kg)   LMP  (LMP Unknown)   SpO2 91%   BMI 31.62 kg/m  Gen: NAD, resting comfortably, can be somewhat tangential, easily frustrated CV: RRR no murmurs rubs or gallops Lungs: CTAB no crackles, wheeze, rhonchi Ext: no edema Skin: warm, dry MSK: Patient points to groin and lateral hip as sources of pain with flexion of the hip from a seated position.  Attempted internal and external rotation from seated position which was limited but did not produce significant pain.  Left shoulder with good range of motion.  Did have pain with empty can and Hawkins test.  Also had some pain with tenderness along the shoulder joint itself  Patient also with some medial joint line tenderness on right knee    Assessment and Plan   #Memory loss/suspected dementia-patient was considering canceling visit with Dr. Tomi Likens that is upcoming-I strongly discouraged this and encouraged her to follow-up.  For orthopedics-wanted to know patient can become easily irritated  # Right hip/groin pain S:patient with severe right groin pain worse with walking.  Has had pain it for a while but worsened last week. No fall or injury recently including in last few months. Due to pain having trouble lifting the leg or bending the knee. Also having knee pain but  Not clearly radiating pain.   Last x-rays of hip were left hip films  08/03/2019- also noted to have moderate to marked spinal degernative changes L5-s1 at that time. She ultimately was seen by [facility] health Ortho care and had an injection into the left hip with significant improvement in pain A/P: I suspect patient's current left hip pain may be related to arthritis similar to occurrence of significant pain last December which responded well to steroid injection.  I think a conservative trial of prednisone is a reasonable place to start.  We discussed possible x-rays prior to orthopedics consult what it would like to be easier for patient to simply have those at time of orthopedic visit and they declined for now.  We discussed she may potentially need another injection  Patient also with some right  medial joint line pain of the knee-possible arthritis.  Family also reports patient has stopped taking gabapentin and they wonder if this could contribute to her pain some.  Left Shoulder Pain S: 1-2 months of pain with shoulder in certain positions. Limited historian A/P: suspect rotator cuff vs. Arthritis of shoulder. See right groin pain discusison above- suspect prednisone may help this along with the knee and hip  #Dizziness-patient reports intermittent issues with dizziness.  We have gotten some reports from home health about lower blood pressures.  The only medication patient is on at present is bisoprolol 5 mg which is mainly for rate control-we did not specifically discuss this but I am okay with this being held if needed if blood pressure is running low.  I wonder if patient's level of pain could be triggering some dizziness as well-we discussed regrouping after treatment for her hip/knee/shoulder  #Patient also has not had the opportunity to complete blood work that was ordered last visit-she agrees to have this done today  Recommended follow up: As needed for acute concerns. Future Appointments  Date Time Provider [place]  07/04/2020  1:30 PM Pieter Partridge, DO [facility] None  07/10/2020 10:45 AM Elisha Ponder Stephani Police, DPM [facility]    Lab/Order associations:   ICD-10-CM   1. Right hip pain  M25.551 Ambulatory referral to Orthopedic Surgery  2. Acute pain of left shoulder  M25.512 Ambulatory referral to Orthopedic Surgery  3. Pure hypercholesterolemia  E78.00 CBC With Differential/Platelet    COMPLETE METABOLIC PANEL WITH GFR    LDL cholesterol, direct    Meds ordered this encounter  Medications  . predniSONE (DELTASONE) 20 MG tablet    Sig: Take 2 pills for 3 days, 1 pill for 4 days    Dispense:  10 tablet    Refill:  0   Time Spent: 30 minutes of total time (4:10 PM-4:40 PM) was spent on the date of the encounter performing the following actions: chart review prior to seeing the patient, obtaining history, performing a medically necessary exam, counseling on the treatment plan, placing orders, and documenting in our EHR.   Return precautions advised.  [name], MD

## 2020-06-23 NOTE — Patient Instructions (Addendum)
Thanks for doing labs If you have mychart- we will send your results within 3 business days of [place] receiving them.  If you do not have mychart- we will call you about results within 5 business days of [place] receiving them.  *please note we are currently using [facility] which has a longer processing time than [place] typically so labs may not come back as quickly as in the past *please also note that you will see labs on mychart as soon as they post. I will later go in and write notes on them- will say "notes from Dr. Yong Channel"  Lets try prednisone until you can see Dr. Junius Roads  We will call you within two weeks about your referral to orthopedics. If you do not hear within 3 weeks, give [place] a call.

## 2020-06-24 ENCOUNTER — Encounter: Payer: Self-pay | Admitting: Family Medicine

## 2020-06-24 ENCOUNTER — Other Ambulatory Visit: Payer: Self-pay

## 2020-06-24 ENCOUNTER — Ambulatory Visit: Payer: Medicare Other | Admitting: Family Medicine

## 2020-06-24 VITALS — BP 126/74 | HR 73 | Temp 98.1°F | Ht 64.0 in | Wt 184.2 lb

## 2020-06-24 DIAGNOSIS — M25551 Pain in right hip: Secondary | ICD-10-CM | POA: Diagnosis not present

## 2020-06-24 DIAGNOSIS — E7800 Pure hypercholesterolemia, unspecified: Secondary | ICD-10-CM

## 2020-06-24 DIAGNOSIS — M25512 Pain in left shoulder: Secondary | ICD-10-CM | POA: Diagnosis not present

## 2020-06-24 MED ORDER — PREDNISONE 20 MG PO TABS: ORAL_TABLET | ORAL | 0 refills | Status: DC

## 2020-06-25 ENCOUNTER — Ambulatory Visit (INDEPENDENT_AMBULATORY_CARE_PROVIDER_SITE_OTHER): Payer: Medicare Other | Admitting: Family Medicine

## 2020-06-25 ENCOUNTER — Ambulatory Visit: Payer: Self-pay

## 2020-06-25 ENCOUNTER — Encounter: Payer: Self-pay | Admitting: Family Medicine

## 2020-06-25 DIAGNOSIS — R42 Dizziness and giddiness: Secondary | ICD-10-CM

## 2020-06-25 DIAGNOSIS — M25512 Pain in left shoulder: Secondary | ICD-10-CM | POA: Diagnosis not present

## 2020-06-25 DIAGNOSIS — M25551 Pain in right hip: Secondary | ICD-10-CM | POA: Diagnosis not present

## 2020-06-25 LAB — CBC WITH DIFFERENTIAL/PLATELET
Absolute Monocytes: 650 cells/uL (ref 200–950)
Basophils Absolute: 37 cells/uL (ref 0–200)
Basophils Relative: 0.5 %
Eosinophils Absolute: 139 cells/uL (ref 15–500)
Eosinophils Relative: 1.9 %
HCT: 37.7 % (ref 35.0–45.0)
Hemoglobin: 12.1 g/dL (ref 11.7–15.5)
Lymphs Abs: 2088 cells/uL (ref 850–3900)
MCH: 28.3 pg (ref 27.0–33.0)
MCHC: 32.1 g/dL (ref 32.0–36.0)
MCV: 88.3 fL (ref 80.0–100.0)
MPV: 10.3 fL (ref 7.5–12.5)
Monocytes Relative: 8.9 %
Neutro Abs: 4387 cells/uL (ref 1500–7800)
Neutrophils Relative %: 60.1 %
Platelets: 196 10*3/uL (ref 140–400)
RBC: 4.27 10*6/uL (ref 3.80–5.10)
RDW: 13.1 % (ref 11.0–15.0)
Total Lymphocyte: 28.6 %
WBC: 7.3 10*3/uL (ref 3.8–10.8)

## 2020-06-25 LAB — COMPLETE METABOLIC PANEL WITH GFR
AG Ratio: 1.5 (calc) (ref 1.0–2.5)
ALT: 14 U/L (ref 6–29)
AST: 13 U/L (ref 10–35)
Albumin: 4.2 g/dL (ref 3.6–5.1)
Alkaline phosphatase (APISO): 72 U/L (ref 37–153)
BUN/Creatinine Ratio: 20 (calc) (ref 6–22)
BUN: 26 mg/dL — ABNORMAL HIGH (ref 7–25)
CO2: 26 mmol/L (ref 20–32)
Calcium: 10.2 mg/dL (ref 8.6–10.4)
Chloride: 105 mmol/L (ref 98–110)
Creat: 1.27 mg/dL — ABNORMAL HIGH (ref 0.60–0.88)
GFR, Est African American: 43 mL/min/{1.73_m2} — ABNORMAL LOW (ref >=60)
GFR, Est Non African American: 37 mL/min/{1.73_m2} — ABNORMAL LOW (ref >=60)
Globulin: 2.8 g/dL (calc) (ref 1.9–3.7)
Glucose, Bld: 109 mg/dL — ABNORMAL HIGH (ref 65–99)
Potassium: 4.9 mmol/L (ref 3.5–5.3)
Sodium: 141 mmol/L (ref 135–146)
Total Bilirubin: 0.5 mg/dL (ref 0.2–1.2)
Total Protein: 7 g/dL (ref 6.1–8.1)

## 2020-06-25 NOTE — Progress Notes (Signed)
Office Visit Note   Patient: [name]           Date of Birth: 03-11-1928           MRN: [id number] Visit Date: 06/25/2020 Requested by: Marin Olp, MD [place],  [place] [id number] PCP: Marin Olp, MD  Subjective: Chief Complaint  Patient presents with  . Right Hip - Pain    Pain in the groin x 2 weeks. Hurts to lift the right leg up to even walk. Ambulating with rolling walker.  . Left Shoulder - Pain    Pain in the left shoulder x 4 weeks. Decreased ROM due to pain. Has had this in the past - cortisone injection helped.    HPI: Pleasant 86yo F presenting to clinic with concerns of ongoing chronic right hip pain, as well as 1 month of left shoulder pain. Patient states that her right hip has been bothering her for years, and significantly limits her daily activities. It has progressively worsened over the past two weeks, prompting this appointment. She has had an injection in the past, and is curious to know if she can have another injection.  Per her shoulder, she states she has had chronic shoulder pain in the past, as well as weakness with lifting her arm. This has started to recur about a month ago, though she cannot recall any trauma. Endorses pain and weakness with using the arm. Reportedly, She has had an injection into this shoulder in the past, with good benefit.               ROS:   All other systems were reviewed and are negative.  Objective: Vital Signs: LMP  (LMP Unknown)   Physical Exam:  General:  Alert and oriented, in no acute distress. Pulm:  Breathing unlabored. Psy:  Normal mood, congruent affect. Skin:  Right inguinal area and left shoulder with no bruising, rashes, or erythema. Overlying skin intact.   Right hip with limited ROM throughout all fields when compared to left, and significant with internal/external rotation, as well as weight bearing.  Left shoulder with reduced Abduction, unable to lift beyond 50* without  assistance of right hand. Minimal resistance with empty can. Endorses pain with internal and external rotation of the arm. Pain with palpation of AC Joint as well.   Imaging: US Guided Needle Placement  Result Date: 06/25/2020 Ultrasound guided injection is preferred based studies that show increased duration, increased effect, greater accuracy, decreased procedural pain, increased response rate, and decreased cost with ultrasound guided versus blind injection.   Verbal informed consent obtained.  Time-out conducted.  Noted no overlying erythema, induration, or other signs of local infection. Ultrasound-guided right hip injection: After sterile prep with Betadine, injected 8 cc 1% lidocaine without epinephrine and 40 mg methylprednisolone using a 22-gauge spinal needle, passing the needle through the iliofemoral ligament into the femoral head/neck junction.  Injectate seen filling joint capsule.  Good immediate relief.     Assessment & Plan: 86yo F presenting to clinic with concerns of right hip and left shoulder pain. Both issues have troubled her for some time, though have previously improved with cortisone injections. Shoulder pain/weakness seems consistent with rotator cuff pathology, though likely with arthritic component. Will attempt subacromial injection today (see procedure note below). If no benefit, would consider RTC for [place] injection or AC Joint injection.   Right hip with history of significant OA. Previously responded very well to cortisone injection. Procedure  repeated today (see imaging for procedure note).   Patient tolerated procedures very well, with significant relief during anesthetic phase. Strict return precautions were discussed. Patient and her husband expressed understanding, and had no further questions or concerns today.      Procedures: Left shoulder subacromial Cortisone Injection:  Risks and benefits of procedure discussed, Patient opted to proceed. Verbal Consent  obtained.  Timeout performed.  Skin prepped in a sterile fashion with betadine before further cleansing with alcohol. Ethyl Chloride was used for topical analgesia.  Left subacromial area was injected with 3cc 1% Lidocaine without epinephrine via the posterior approach using a 25G, 1.5in needle. Syringe was removed from the needle, and 40mg  methylprednisolone was then injected into the joint.   Patient tolerated the injection well with no immediate complications. Aftercare instructions were discussed, and patient was given strict return precautions.      PMFS History: Patient Active Problem List   Diagnosis Date Noted  . Memory loss 12/14/2019  . Recurrent major depressive disorder, in full remission ([place]) 10/16/2019  . Loose stools 06/01/2019  . Hemorrhoids 06/01/2019  . Chest pain 04/25/2018  . Agitation 03/11/2018  . Chronic diarrhea 09/04/2017  . History of chronic respiratory failure- prior on o2 06/15/2017  . Aortic atherosclerosis ([place]) 06/30/2016  . Diastolic CHF ([place]) 60/45/4098  . Bacteremia due to Streptococcus 11/21/2015  . Cellulitis of leg, right 11/20/2015  . History of cardioembolic stroke 11/91/4782  . Diverticulitis of colon 09/26/2014  . CKD (chronic kidney disease), stage III ([place]) 09/26/2014  . Laryngeal carcinoma ([place])   . Spinal stenosis, lumbar region, with neurogenic claudication 05/09/2014  . Weakness generalized 06/30/2013  . History of Focal seizure ([place]) 06/16/2013  . History of TIA (transient ischemic attack) 05/31/2013  . Bilateral hand pain 01/09/2013  . History of Large B-cell lymphoma  02/18/2012  . Atrial fibrillation ([place]) 12/14/2008  . Anxiety state 10/20/2008  . Depression 10/20/2008  . Dyspnea on exertion 07/06/2008  . COPD GOLD II 03/26/2008  . GERD (gastroesophageal reflux disease) 05/03/2007  . Hyperlipidemia 03/18/2007  . Essential hypertension 03/18/2007   Past Medical History:  Diagnosis Date  . ABDOMINAL INCISIONAL HERNIA  04/03/2010      . Anxiety   . Arthritis    "lots; hands" (05/26/2013)  . Blood transfusion    "w/1st miscarriage" (05/26/2013)  . COPD (chronic obstructive pulmonary disease) ([place])   . Coronary artery disease   . Depression   . Diverticulitis of colon with hemorrhage 07/24/2008  . DVT (deep venous thrombosis) ([place]) 1954   "after I had my first child" (05/26/2013)  . Exertional shortness of breath   . Family history of anesthesia complication    "sisters also get sick as a dog" (05/26/2013)  . GERD 05/03/2007      . GERD (gastroesophageal reflux disease)   . H/O hiatal hernia   . HCAP (healthcare-associated pneumonia) 06/26/2013  . Heart murmur    "when I was a child" (05/26/2013)  . History of TIAs   . Hyperlipidemia   . Hypertension   . Keratotic plaque   . Laryngeal carcinoma ([place]) hx of ca   remotely with resection and xrt/chronic hoarsemenss  . Lymphona, mantle cell, inguinal region/lower limb ([place])    "large c-cell" (05/26/2013)  . OSTEOARTHRITIS 05/03/2007  . PEPTIC ULCER DISEASE 07/06/2008  . Pneumonia 2013   "once" (05/26/2013)  . PUD (peptic ulcer disease)   . PVD (peripheral vascular disease) ([place])   . Seizures ([place])   . Stroke (  [place]) 05/26/2013   "very mild; affected my speech for a while" (05/26/2013)  . VARICOSE VEINS LOWER EXTREMITIES W/INFLAMMATION 01/15/2009    Family History  Problem Relation Age of Onset  . Colon cancer Sister        colon  . Hyperlipidemia Mother   . Stroke Mother   . Cancer Father        mesothelioma    Past Surgical History:  Procedure Laterality Date  . ABDOMINAL EXPLORATION SURGERY  12/01/2012  . APPENDECTOMY  09/13/2008  . CAROTID ENDARTERECTOMY Bilateral 2004   2004 a month apart right and left  . CARPAL TUNNEL RELEASE Left ?1960's  . CATARACT EXTRACTION  2005   bilateral  . COLON SURGERY  09/13/2008   Laparoscopic assisted converted to open sigmoid colectomy.Archie Endo 09/13/2008 (05/26/2013)  . DILATION AND CURETTAGE OF UTERUS   1950's   "had 2 for miscarriages" (05/26/2013)  . DIVERTING ILEOSTOMY  09/22/2008   iliostomy for diverticulitis/notes 09/22/2008 (05/26/2013)  . HERNIA REPAIR  05/2010   ventral hernia repair  . ILEOSTOMY CLOSURE  01/2010  . RADICAL NECK DISSECTION  1986   "larynx cancer" (05/26/2013)  . TONSILLECTOMY AND ADENOIDECTOMY  1936   "tonsils grew back; no 2nd OR" (05/26/2013)  . TOTAL KNEE ARTHROPLASTY Bilateral    2002 left 2008 right  . TUBAL LIGATION  1972   Social History   Occupational History  . Occupation: retired    Fish farm manager: RETIRED  Tobacco Use  . Smoking status: Former Smoker    Packs/day: 2.50    Years: 37.00    Pack years: 92.50    Types: Cigarettes    Quit date: 09/05/1984    Years since quitting: 35.8  . Smokeless tobacco: Never Used  . Tobacco comment: quit when she was dx with cancer  Vaping Use  . Vaping Use: Never used  Substance and Sexual Activity  . Alcohol use: No    Alcohol/week: 0.0 standard drinks  . Drug use: No  . Sexual activity: Not Currently

## 2020-06-25 NOTE — Progress Notes (Signed)
I saw and examined the patient with Dr. Elouise Munroe and agree with assessment and plan as outlined.    Left shoulder pain and right hip pain. Also vertigo issues (mentioned when getting up from supine position).   Left shoulder and right hip injected today with good immediate relief.  Will make referral to PT for vestibular rehab.

## 2020-07-02 NOTE — Progress Notes (Deleted)
NEUROLOGY FOLLOW UP OFFICE NOTE  BELL CARBO [id number]   Subjective:  Ronie Fleeger is an 86 year old right-handed female with lumbar stenosis, mild dementia and history of symptomatic seizure secondary to cardioembolic stroke who follows up for irritability.  UPDATE: Current medications:  Keppra 1000mg  twice daily; [name], gabapentin 200mg  at bedtime, sertraline 25mg  daily  Due to irritability, recommended changing Keppra to Depakote.  However patient and family decided not to make the change.  Changed sertraline to citalopram.  However, it reportedly made her more irritable and sleepy.  Recommended ***  Labs 12/13/2019:  CBC with WBC 5.5, HGB 12.6, HCT 42.3, PLT 165; BMP with Na 141, K 5, Cl 103, CO2 31, glucose 106, BUN 15, Cr 1.06, Ca 9.9 10/16/2019:  Hepatic panel with t bili 0.5, ALP 81, AST 13, ALT 13.  HISTORY: She has had several episodes of confusion and aphasia in October and November of 2014, associated with stroke and presumed seizure. On 05/26/13, MRI of the brain was performed, which revealed an acute lacunar infarct in the left anterior corona radiata and lentiform nucleus. CTA of the head revealed atherosclerotic changes in the cavernous carotid arteries without significant stenosis. CTA of neck revealed focal 50% stenosis of mid right ICA. She was switched from [name] to Eliquis. She was readmitted to the hospital on 05/31/13 with recurrent aphasia. She had trouble getting her words out and then couldn't speak at all. Repeat MRI of brain revealed new punctate infarct in the left posterior corona radiata, the same location as previous infarct. No changes in management were made. She was readmitted to the hospital on 06/07/13 for altered mental status. EEG was performed, showing left fronto-parieto-temporal theta slowing. Due to possibility of seizure, she was started on Keppra 250mg  BID. No change made to Eliquis. She presented to the ED on 06/16/13  after waking up in the morning with expressive aphasia and right-sided weakness. Symptoms improved in the ED. CT head revealed prior lacunar infarct in left lentiform nucleus and corona radiata, but no new abnormalities. It was suspected that she may have had another seizure and her Keppra was increased to 500mg  BID. She was admitted to the hospital again on 06/21/13 again for expressive aphasia. MRI brain revealed 2 new punctate infarcts in the bilateral hemispheres at level of centrum semiovale. Eliquis was changed to Xarelto. Keppra was increased to 750mg  BID.   She presented to the ED on 08/05/14 for recurrent episode of aphasia, lasting 1.5 hours. This was more significant than the previous year. She described it as knowing what she wanted to say but having trouble getting the words out. She had trouble writing as well. MRI of brain revealed no acute infarct. She had an episode two days prior, lasting 1 hour. Carotid doppler revealed no hemodynamically significant ICA stenosis.   She was admitted to [facility] from 05/01/17 to 05/03/17 for altered mental status and generalized weakness with 2 seizures over the weekend. She was found by her daughter sitting in the dark moaning with foaming at the mouth and left sided shaking lasting a couple of minutes. She was altered for much longer time. CT of head from 04/30/17 and 05/01/17 revealed no acute changes. She was found to have a temperature of 100.3. She was found to have Klebsiella UTI/cystitis on UA and culture. She was loaded with Keppra and treated with Cipro. Keppra was increased to 1000mg  twice daily. About a week prior, she reportedly had another brief episode of  jibberish speech and right facial droop, lasting 2-3 minutes.  In 2019, she has had significant depression related to her chronic pain, loss of independence (such as no longer able to drive), and the death of her daughter. Back in June, we try to transition her  from [place] to [name] in an attempt to alleviate some of her irritability. She began experiencing increased confusion, falls, irritability and paranoia. She was subsequently switched back from [name] to [place]. She was started on [name] to help with agitation.   PAST MEDICAL HISTORY: Past Medical History:  Diagnosis Date  . ABDOMINAL INCISIONAL HERNIA 04/03/2010      . Anxiety   . Arthritis    "lots; hands" (05/26/2013)  . Blood transfusion    "w/1st miscarriage" (05/26/2013)  . COPD (chronic obstructive pulmonary disease) ([place])   . Coronary artery disease   . Depression   . Diverticulitis of colon with hemorrhage 07/24/2008  . DVT (deep venous thrombosis) ([place]) 1954   "after I had my first child" (05/26/2013)  . Exertional shortness of breath   . Family history of anesthesia complication    "sisters also get sick as a dog" (05/26/2013)  . GERD 05/03/2007      . GERD (gastroesophageal reflux disease)   . H/O hiatal hernia   . HCAP (healthcare-associated pneumonia) 06/26/2013  . Heart murmur    "when I was a child" (05/26/2013)  . History of TIAs   . Hyperlipidemia   . Hypertension   . Keratotic plaque   . Laryngeal carcinoma ([place]) hx of ca   remotely with resection and xrt/chronic hoarsemenss  . Lymphona, mantle cell, inguinal region/lower limb ([place])    "large c-cell" (05/26/2013)  . OSTEOARTHRITIS 05/03/2007  . PEPTIC ULCER DISEASE 07/06/2008  . Pneumonia 2013   "once" (05/26/2013)  . PUD (peptic ulcer disease)   . PVD (peripheral vascular disease) ([place])   . Seizures ([place])   . Stroke ([place]) 05/26/2013   "very mild; affected my speech for a while" (05/26/2013)  . VARICOSE VEINS LOWER EXTREMITIES W/INFLAMMATION 01/15/2009    MEDICATIONS: Current Outpatient Medications on File Prior to Visit  Medication Sig Dispense Refill  . albuterol (VENTOLIN HFA) 108 (90 Base) MCG/ACT inhaler TAKE 2 PUFFS BY MOUTH EVERY 6 HOURS AS NEEDED FOR WHEEZE OR SHORTNESS OF BREATH (Patient  not taking: Reported on 04/12/2020) 18 g 1  . atorvastatin (LIPITOR) 20 MG tablet TAKE 1 TABLET BY MOUTH EVERY DAY 90 tablet 1  . bisoprolol (ZEBETA) 5 MG tablet TAKE 1 TABLET BY MOUTH EVERY DAY 90 tablet 1  . busPIRone ([name]) 5 MG tablet TAKE 1 TABLET BY MOUTH TWICE A DAY (Patient taking differently: 5 mg daily. ) 180 tablet 3  . cholecalciferol (VITAMIN D) 1000 UNITS tablet Take 1,000 Units by mouth daily.    . Cyanocobalamin (VITAMIN B-12 PO) Take 1 tablet by mouth daily.    . hydrocortisone (ANUSOL-HC) 2.5 % rectal cream Place 1 application rectally at bedtime. 30 g 0  . ketoconazole (NIZORAL) 2 % cream Apply 1 application topically daily. (Patient not taking: Reported on 06/24/2020) 15 g 0  . levETIRAcetam (KEPPRA) 1000 MG tablet TAKE 1 TABLET BY MOUTH TWICE A DAY 180 tablet 0  . loperamide (IMODIUM) 2 MG capsule Take 2 mg by mouth daily as needed for diarrhea or loose stools.     . NONFORMULARY OR COMPOUNDED ITEM Antifungal solution: Terbinafine 3%, Fluconazole 2%, Tea Tree Oil 5%, Urea 10%, Ibuprofen 2% in DMSO suspension #3mL (Patient not taking:  Reported on 06/24/2020) 1 each 3  . omeprazole (PRILOSEC) 20 MG capsule Take 20 mg by mouth daily as needed.     . predniSONE (DELTASONE) 20 MG tablet Take 2 pills for 3 days, 1 pill for 4 days 10 tablet 0  . sertraline (ZOLOFT) 25 MG tablet TAKE 1 TABLET BY MOUTH EVERY DAY (Patient not taking: Reported on 06/24/2020) 90 tablet 0  . triamcinolone cream (KENALOG) 0.1 % Apply 1 application topically 2 (two) times daily. For 7-10 days maximum (Patient not taking: Reported on 06/24/2020) 80 g 0  . XARELTO 15 MG TABS tablet TAKE 1 TABLET BY MOUTH EVERY DAY WITH SUPPER 30 tablet 10   No current facility-administered medications on file prior to visit.    ALLERGIES: Allergies  Allergen Reactions  . Ativan [Lorazepam] Other (See Comments)    Severe hallucinations   . Propoxyphene Hcl Nausea And Vomiting  . Codeine Itching  . Penicillins Rash     Has patient had a PCN reaction causing immediate rash, facial/tongue/throat swelling, SOB or lightheadedness with hypotension: Yes Has patient had a PCN reaction causing severe rash involving mucus membranes or skin necrosis: Unk Has patient had a PCN reaction that required hospitalization: No Has patient had a PCN reaction occurring within the last 10 years: Yes If all of the above answers are "NO", then may proceed with Cephalosporin use.   . Vancomycin Rash    FAMILY HISTORY: Family History  Problem Relation Age of Onset  . Colon cancer Sister        colon  . Hyperlipidemia Mother   . Stroke Mother   . Cancer Father        mesothelioma   SOCIAL HISTORY: Social History   Socioeconomic History  . Marital status: Married    Spouse name: Percell Miller  . Number of children: 6  . Years of education: Not on file  . Highest education level: Not on file  Occupational History  . Occupation: retired    Fish farm manager: RETIRED  Tobacco Use  . Smoking status: Former Smoker    Packs/day: 2.50    Years: 37.00    Pack years: 92.50    Types: Cigarettes    Quit date: 09/05/1984    Years since quitting: 35.8  . Smokeless tobacco: Never Used  . Tobacco comment: quit when she was dx with cancer  Vaping Use  . Vaping Use: Never used  Substance and Sexual Activity  . Alcohol use: No    Alcohol/week: 0.0 standard drinks  . Drug use: No  . Sexual activity: Not Currently  Other Topics Concern  . Not on file  Social History Narrative   Married (husband patient of Dr. Yong Channel). 6 children (lost 2 for total 8). 15 grandkids. 6 greatgrandchildren.    Lives with husband. Husband drives. Patient does not drive.       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    Social Determinants of Health   Financial Resource Strain:   . Difficulty of Paying Living Expenses: Not on file  Food Insecurity:   . Worried About Charity fundraiser in the Last  Year: Not on file  . Ran Out of Food in the Last Year: Not on file  Transportation Needs:   . Lack of Transportation (Medical): Not on file  . Lack of Transportation (Non-Medical): Not on file  Physical Activity:   . Days of Exercise per  Week: Not on file  . Minutes of Exercise per Session: Not on file  Stress:   . Feeling of Stress : Not on file  Social Connections:   . Frequency of Communication with Friends and Family: Not on file  . Frequency of Social Gatherings with Friends and Family: Not on file  . Attends Religious Services: Not on file  . Active Member of Clubs or Organizations: Not on file  . Attends Archivist Meetings: Not on file  . Marital Status: Not on file  Intimate Partner Violence:   . Fear of Current or Ex-Partner: Not on file  . Emotionally Abused: Not on file  . Physically Abused: Not on file  . Sexually Abused: Not on file     Objective:  *** General: No acute distress.  Patient appears ***-groomed.   Head:  Normocephalic/atraumatic Eyes:  Fundi examined but not visualized Neck: supple, no paraspinal tenderness, full range of motion Heart:  Regular rate and rhythm Lungs:  Clear to auscultation bilaterally Back: No paraspinal tenderness Neurological Exam: alert and oriented to person, place, and time. Attention span and concentration intact, recent and remote memory intact, fund of knowledge intact.  Speech fluent and not dysarthric, language intact.  CN II-XII intact. Bulk and tone normal, muscle strength 5/5 throughout.  Sensation to light touch, temperature and vibration intact.  Deep tendon reflexes 2+ throughout, toes downgoing.  Finger to nose and heel to shin testing intact.  Gait normal, Romberg negative.   Assessment/Plan:   ***  Metta Clines, DO  CC: ***

## 2020-07-04 ENCOUNTER — Ambulatory Visit: Payer: Medicare Other | Admitting: Neurology

## 2020-07-10 ENCOUNTER — Ambulatory Visit (INDEPENDENT_AMBULATORY_CARE_PROVIDER_SITE_OTHER): Payer: Medicare Other | Admitting: Podiatry

## 2020-07-10 ENCOUNTER — Encounter: Payer: Self-pay | Admitting: Podiatry

## 2020-07-10 ENCOUNTER — Other Ambulatory Visit: Payer: Self-pay

## 2020-07-10 DIAGNOSIS — B351 Tinea unguium: Secondary | ICD-10-CM | POA: Diagnosis not present

## 2020-07-10 DIAGNOSIS — M79675 Pain in left toe(s): Secondary | ICD-10-CM | POA: Diagnosis not present

## 2020-07-10 DIAGNOSIS — M79674 Pain in right toe(s): Secondary | ICD-10-CM

## 2020-07-13 ENCOUNTER — Other Ambulatory Visit: Payer: Self-pay | Admitting: Neurology

## 2020-07-14 NOTE — Progress Notes (Signed)
Subjective:  Patient ID: [name], female    DOB: 26-Jul-1928,  MRN: [id number]  86 y.o. female presents with painful thick toenails that are difficult to trim. Pain interferes with ambulation. Aggravating factors include wearing enclosed shoe gear. Pain is relieved with periodic professional debridement.   She has h/o memory loss. She is accompanied by her husband who also has an appointment today.   PCP is Brayton Mars. Hunter and last visit was on 12/14/2019.  Review of Systems: Negative except as noted in the HPI.  Past Medical History:  Diagnosis Date   ABDOMINAL INCISIONAL HERNIA 04/03/2010       Anxiety    Arthritis    "lots; hands" (05/26/2013)   Blood transfusion    "w/1st miscarriage" (05/26/2013)   COPD (chronic obstructive pulmonary disease) (H[facility])    Coronary artery disease    Depression    Diverticulitis of colon with hemorrhage 07/24/2008   DVT (deep venous thrombosis) ([place]) 1954   "after I had my first child" (05/26/2013)   Exertional shortness of breath    Family history of anesthesia complication    "sisters also get sick as a dog" (05/26/2013)   GERD 05/03/2007       GERD (gastroesophageal reflux disease)    H/O hiatal hernia    HCAP (healthcare-associated pneumonia) 06/26/2013   Heart murmur    "when I was a child" (05/26/2013)   History of TIAs    Hyperlipidemia    Hypertension    Keratotic plaque    Laryngeal carcinoma ([facility]) hx of ca   remotely with resection and xrt/chronic hoarsemenss   Lymphona, mantle cell, inguinal region/lower limb ([place])    "large c-cell" (05/26/2013)   OSTEOARTHRITIS 05/03/2007   PEPTIC ULCER DISEASE 07/06/2008   Pneumonia 2013   "once" (05/26/2013)   PUD (peptic ulcer disease)    PVD (peripheral vascular disease) ([place])    Seizures ([place])    Stroke ([place]) 05/26/2013   "very mild; affected my speech for a while" (05/26/2013)   VARICOSE VEINS LOWER EXTREMITIES W/INFLAMMATION 01/15/2009   Past  Surgical History:  Procedure Laterality Date   ABDOMINAL EXPLORATION SURGERY  12/01/2012   APPENDECTOMY  09/13/2008   CAROTID ENDARTERECTOMY Bilateral 2004   2004 a month apart right and left   CARPAL TUNNEL RELEASE Left ?1960's   CATARACT EXTRACTION  2005   bilateral   COLON SURGERY  09/13/2008   Laparoscopic assisted converted to open sigmoid colectomy.Archie Endo 09/13/2008 (05/26/2013)   DILATION AND CURETTAGE OF UTERUS  1950's   "had 2 for miscarriages" (05/26/2013)   DIVERTING ILEOSTOMY  09/22/2008   iliostomy for diverticulitis/notes 09/22/2008 (05/26/2013)   HERNIA REPAIR  05/2010   ventral hernia repair   ILEOSTOMY CLOSURE  01/2010   RADICAL NECK DISSECTION  1986   "larynx cancer" (05/26/2013)   TONSILLECTOMY AND ADENOIDECTOMY  1936   "tonsils grew back; no 2nd OR" (05/26/2013)   TOTAL KNEE ARTHROPLASTY Bilateral    2002 left 2008 right   TUBAL LIGATION  1972   Patient Active Problem List   Diagnosis Date Noted   Memory loss 12/14/2019   Recurrent major depressive disorder, in full remission ([place]) 10/16/2019   Loose stools 06/01/2019   Hemorrhoids 06/01/2019   Chest pain 04/25/2018   Agitation 03/11/2018   Chronic diarrhea 09/04/2017   History of chronic respiratory failure- prior on o2 06/15/2017   Aortic atherosclerosis ([place]) 76/81/1572   Diastolic CHF ([place]) 62/10/5595   Bacteremia due to Streptococcus 11/21/2015   Cellulitis of leg,  right 11/20/2015   History of cardioembolic stroke 17/61/6073   Diverticulitis of colon 09/26/2014   CKD (chronic kidney disease), stage III ([place]) 09/26/2014   Laryngeal carcinoma ([place])    Spinal stenosis, lumbar region, with neurogenic claudication 05/09/2014   Weakness generalized 06/30/2013   History of Focal seizure ([place]) 06/16/2013   History of TIA (transient ischemic attack) 05/31/2013   Bilateral hand pain 01/09/2013   History of Large B-cell lymphoma  02/18/2012   Atrial fibrillation ([place])  12/14/2008   Anxiety state 10/20/2008   Depression 10/20/2008   Dyspnea on exertion 07/06/2008   COPD GOLD II 03/26/2008   GERD (gastroesophageal reflux disease) 05/03/2007   Hyperlipidemia 03/18/2007   Essential hypertension 03/18/2007    Current Outpatient Medications:    albuterol (VENTOLIN HFA) 108 (90 Base) MCG/ACT inhaler, TAKE 2 PUFFS BY MOUTH EVERY 6 HOURS AS NEEDED FOR WHEEZE OR SHORTNESS OF BREATH, Disp: 18 g, Rfl: 1   atorvastatin (LIPITOR) 20 MG tablet, TAKE 1 TABLET BY MOUTH EVERY DAY, Disp: 90 tablet, Rfl: 1   bisoprolol (ZEBETA) 5 MG tablet, TAKE 1 TABLET BY MOUTH EVERY DAY, Disp: 90 tablet, Rfl: 1   busPIRone ([name]) 5 MG tablet, TAKE 1 TABLET BY MOUTH TWICE A DAY (Patient taking differently: 5 mg daily. ), Disp: 180 tablet, Rfl: 3   cholecalciferol (VITAMIN D) 1000 UNITS tablet, Take 1,000 Units by mouth daily., Disp: , Rfl:    citalopram (CELEXA) 10 MG tablet, Take 10 mg by mouth daily., Disp: , Rfl:    Cyanocobalamin (VITAMIN B-12 PO), Take 1 tablet by mouth daily., Disp: , Rfl:    hydrocortisone (ANUSOL-HC) 2.5 % rectal cream, Place 1 application rectally at bedtime., Disp: 30 g, Rfl: 0   ketoconazole (NIZORAL) 2 % cream, Apply 1 application topically daily., Disp: 15 g, Rfl: 0   levETIRAcetam (KEPPRA) 1000 MG tablet, TAKE 1 TABLET BY MOUTH TWICE A DAY, Disp: 180 tablet, Rfl: 0   loperamide (IMODIUM) 2 MG capsule, Take 2 mg by mouth daily as needed for diarrhea or loose stools. , Disp: , Rfl:    NONFORMULARY OR COMPOUNDED ITEM, Antifungal solution: Terbinafine 3%, Fluconazole 2%, Tea Tree Oil 5%, Urea 10%, Ibuprofen 2% in DMSO suspension #24mL, Disp: 1 each, Rfl: 3   omeprazole (PRILOSEC) 20 MG capsule, Take 20 mg by mouth daily as needed. , Disp: , Rfl:    predniSONE (DELTASONE) 20 MG tablet, Take 2 pills for 3 days, 1 pill for 4 days, Disp: 10 tablet, Rfl: 0   sertraline (ZOLOFT) 25 MG tablet, TAKE 1 TABLET BY MOUTH EVERY DAY, Disp: 90 tablet,  Rfl: 0   triamcinolone cream (KENALOG) 0.1 %, Apply 1 application topically 2 (two) times daily. For 7-10 days maximum, Disp: 80 g, Rfl: 0   XARELTO 15 MG TABS tablet, TAKE 1 TABLET BY MOUTH EVERY DAY WITH SUPPER, Disp: 30 tablet, Rfl: 10 Allergies  Allergen Reactions   Ativan [Lorazepam] Other (See Comments)    Severe hallucinations    Propoxyphene Hcl Nausea And Vomiting   Codeine Itching   Penicillins Rash    Has patient had a PCN reaction causing immediate rash, facial/tongue/throat swelling, SOB or lightheadedness with hypotension: Yes Has patient had a PCN reaction causing severe rash involving mucus membranes or skin necrosis: Unk Has patient had a PCN reaction that required hospitalization: No Has patient had a PCN reaction occurring within the last 10 years: Yes If all of the above answers are "NO", then may proceed with Cephalosporin use.  Vancomycin Rash   Social History   Occupational History   Occupation: retired    Fish farm manager: RETIRED  Tobacco Use   Smoking status: Former Smoker    Packs/day: 2.50    Years: 37.00    Pack years: 92.50    Types: Cigarettes    Quit date: 09/05/1984    Years since quitting: 35.8   Smokeless tobacco: Never Used   Tobacco comment: quit when she was dx with cancer  Vaping Use   Vaping Use: Never used  Substance and Sexual Activity   Alcohol use: No    Alcohol/week: 0.0 standard drinks   Drug use: No   Sexual activity: Not Currently    Objective:   Constitutional Pt is a pleasant 86 y.o. Caucasian female WD, WN in NAD.Marland Kitchen AAO x 3.   Vascular Capillary refill time to digits immediate b/l. Palpable pedal pulses b/l LE. Pedal hair present. Lower extremity skin temperature gradient within normal limits. No pain with calf compression b/l. No edema noted b/l lower extremities. Varicosities present b/l. No cyanosis or clubbing noted.  Neurologic Normal speech. Oriented to person, place, and time. Protective sensation intact  5/5 intact bilaterally with 10g monofilament b/l. Vibratory sensation intact b/l.  Dermatologic Pedal skin with normal turgor, texture and tone bilaterally. No open wounds bilaterally. No interdigital macerations bilaterally. Toenails 1-5 b/l elongated, discolored, dystrophic, thickened, crumbly with subungual debris and tenderness to dorsal palpation.  Orthopedic: Normal muscle strength 5/5 to all lower extremity muscle groups bilaterally. No pain crepitus or joint limitation noted with ROM b/l. Hammertoes noted to the 2-5 bilaterally.   Radiographs: None Assessment:   1. Pain due to onychomycosis of toenails of both feet    Plan:  Patient was evaluated and treated and all questions answered.  Onychomycosis with pain -Nails palliatively debridement as below. -Educated on self-care  Procedure: Nail Debridement Rationale: Pain Type of Debridement: manual, sharp debridement. Instrumentation: Nail nipper, rotary burr. Number of Nails: 10  -Examined patient. -No new findings. No new orders. -Toenails 1-5 b/l were debrided in length and girth with sterile nail nippers and dremel without iatrogenic bleeding.  -Patient to report any pedal injuries to medical professional immediately. -Patient to continue soft, supportive shoe gear daily. -Patient/POA to call should there be question/concern in the interim.  Return in about 3 months (around 10/08/2020) for painful mycotic toenails.  Marzetta Board, DPM

## 2020-07-15 NOTE — Telephone Encounter (Signed)
Patient needs to be seen, no further refills, no follow up is noted.

## 2020-08-19 ENCOUNTER — Ambulatory Visit: Payer: Medicare Other

## 2020-08-26 ENCOUNTER — Ambulatory Visit: Payer: Medicare Other | Admitting: Family Medicine

## 2020-08-26 ENCOUNTER — Ambulatory Visit: Payer: Medicare Other

## 2020-08-26 ENCOUNTER — Other Ambulatory Visit: Payer: Self-pay

## 2020-08-26 ENCOUNTER — Encounter: Payer: Self-pay | Admitting: Family Medicine

## 2020-08-26 DIAGNOSIS — I350 Nonrheumatic aortic (valve) stenosis: Secondary | ICD-10-CM | POA: Insufficient documentation

## 2020-08-26 DIAGNOSIS — J449 Chronic obstructive pulmonary disease, unspecified: Secondary | ICD-10-CM

## 2020-08-26 DIAGNOSIS — G40109 Localization-related (focal) (partial) symptomatic epilepsy and epileptic syndromes with simple partial seizures, not intractable, without status epilepticus: Secondary | ICD-10-CM

## 2020-08-26 DIAGNOSIS — I700 Atherosclerosis of aorta: Secondary | ICD-10-CM

## 2020-08-26 DIAGNOSIS — I5030 Unspecified diastolic (congestive) heart failure: Secondary | ICD-10-CM | POA: Diagnosis not present

## 2020-08-26 DIAGNOSIS — F3342 Major depressive disorder, recurrent, in full remission: Secondary | ICD-10-CM | POA: Diagnosis not present

## 2020-08-26 DIAGNOSIS — C329 Malignant neoplasm of larynx, unspecified: Secondary | ICD-10-CM

## 2020-08-26 DIAGNOSIS — I480 Paroxysmal atrial fibrillation: Secondary | ICD-10-CM

## 2020-08-26 MED ORDER — GABAPENTIN 100 MG PO CAPS
100.0000 mg | ORAL_CAPSULE | Freq: Two times a day (BID) | ORAL | 3 refills | Status: DC
Start: 2020-08-26 — End: 2020-09-04

## 2020-08-26 MED ORDER — LEVETIRACETAM 1000 MG PO TABS
1000.0000 mg | ORAL_TABLET | Freq: Two times a day (BID) | ORAL | 4 refills | Status: DC
Start: 2020-08-26 — End: 2021-02-05

## 2020-08-26 NOTE — Patient Instructions (Addendum)
Health Maintenance Due  Topic Date Due  . DEXA SCAN declines for now Never done  . COVID-19 Vaccine (3 - Pfizer risk 4-dose series) Patient has not had. Recommended updating this  [email or web address] 10/02/2019   Restart gabapentin but only do twice a day for now  If blood pressure under 100 on top # and heart rate is below 75 ok to hold bisoprolol for a day. If any questions call us  Recommended follow up: Return in about 2 months (around 10/24/2020) for follow up- or sooner if needed.

## 2020-08-26 NOTE — Progress Notes (Signed)
Phone [phone] In person visit   Subjective:   [name] is a 87 y.o. year old very pleasant female patient who presents for/with See problem oriented charting Chief Complaint  Patient presents with  . Medication Management  . Follow-up   This visit occurred during the SARS-CoV-2 public health emergency.  Safety protocols were in place, including screening questions prior to the visit, additional usage of staff PPE, and extensive cleaning of exam room while observing appropriate contact time as indicated for disinfecting solutions.   Past Medical History-  Patient Active Problem List   Diagnosis Date Noted  . Diastolic CHF ([place]) 95/63/8756    Priority: High  . History of cardioembolic stroke 43/32/9518    Priority: High  . Spinal stenosis, lumbar region, with neurogenic claudication 05/09/2014    Priority: High  . History of Focal seizure ([place]) 06/16/2013    Priority: High  . History of TIA (transient ischemic attack) 05/31/2013    Priority: High  . History of Large B-cell lymphoma  02/18/2012    Priority: High  . Atrial fibrillation ([place]) 12/14/2008    Priority: High  . Agitation 03/11/2018    Priority: Medium  . History of chronic respiratory failure- prior on o2 06/15/2017    Priority: Medium  . Bacteremia due to Streptococcus 11/21/2015    Priority: Medium  . Cellulitis of leg, right 11/20/2015    Priority: Medium  . Diverticulitis of colon 09/26/2014    Priority: Medium  . CKD (chronic kidney disease), stage III ([place]) 09/26/2014    Priority: Medium  . Laryngeal carcinoma ([place])     Priority: Medium  . Anxiety state 10/20/2008    Priority: Medium  . Depression 10/20/2008    Priority: Medium  . Dyspnea on exertion 07/06/2008    Priority: Medium  . COPD GOLD II 03/26/2008    Priority: Medium  . Hyperlipidemia 03/18/2007    Priority: Medium  . Essential hypertension 03/18/2007    Priority: Medium  . Chronic diarrhea 09/04/2017    Priority: Low   . Aortic atherosclerosis ([place]) 06/30/2016    Priority: Low  . Weakness generalized 06/30/2013    Priority: Low  . Bilateral hand pain 01/09/2013    Priority: Low  . Memory loss 12/14/2019  . Recurrent major depressive disorder, in full remission ([place]) 10/16/2019  . Loose stools 06/01/2019  . Hemorrhoids 06/01/2019  . Chest pain 04/25/2018  . GERD (gastroesophageal reflux disease) 05/03/2007    Medications- reviewed and updated Current Outpatient Medications  Medication Sig Dispense Refill  . albuterol (VENTOLIN HFA) 108 (90 Base) MCG/ACT inhaler TAKE 2 PUFFS BY MOUTH EVERY 6 HOURS AS NEEDED FOR WHEEZE OR SHORTNESS OF BREATH 18 g 1  . atorvastatin (LIPITOR) 20 MG tablet TAKE 1 TABLET BY MOUTH EVERY DAY 90 tablet 1  . bisoprolol (ZEBETA) 5 MG tablet TAKE 1 TABLET BY MOUTH EVERY DAY 90 tablet 1  . busPIRone ([name]) 5 MG tablet TAKE 1 TABLET BY MOUTH TWICE A DAY (Patient taking differently: 5 mg daily.) 180 tablet 3  . cholecalciferol (VITAMIN D) 1000 UNITS tablet Take 1,000 Units by mouth daily.    . Cyanocobalamin (VITAMIN B-12 PO) Take 1 tablet by mouth daily.    . hydrocortisone (ANUSOL-HC) 2.5 % rectal cream Place 1 application rectally at bedtime. 30 g 0  . ketoconazole (NIZORAL) 2 % cream Apply 1 application topically daily. 15 g 0  . loperamide (IMODIUM) 2 MG capsule Take 2 mg by mouth daily as needed for diarrhea  or loose stools.     . NONFORMULARY OR COMPOUNDED ITEM Antifungal solution: Terbinafine 3%, Fluconazole 2%, Tea Tree Oil 5%, Urea 10%, Ibuprofen 2% in DMSO suspension #34mL 1 each 3  . omeprazole (PRILOSEC) 20 MG capsule Take 20 mg by mouth daily as needed.     . sertraline (ZOLOFT) 25 MG tablet TAKE 1 TABLET BY MOUTH EVERY DAY 90 tablet 0  . triamcinolone cream (KENALOG) 0.1 % Apply 1 application topically 2 (two) times daily. For 7-10 days maximum 80 g 0  . XARELTO 15 MG TABS tablet TAKE 1 TABLET BY MOUTH EVERY DAY WITH SUPPER 30 tablet 10  . gabapentin (NEURONTIN)  100 MG capsule Take 1 capsule (100 mg total) by mouth 2 (two) times daily. 180 capsule 3  . levETIRAcetam (KEPPRA) 1000 MG tablet Take 1 tablet (1,000 mg total) by mouth 2 (two) times daily. 60 tablet 4   No current facility-administered medications for this visit.     Objective:  BP 92/60   Pulse 70   Temp (!) 97.3 F (36.3 C) (Temporal)   Ht 5\' 4"  (1.626 m)   Wt 186 lb 9.6 oz (84.6 kg)   LMP  (LMP Unknown)   SpO2 90%   BMI 32.03 kg/m  Gen: NAD, resting comfortably CV: irregularly irregular stable 4/6 SEM Lungs: CTAB no crackles, wheeze, rhonchi  Ext: no edema Skin: warm, dry     Assessment and Plan   #Memory loss/suspected dementia-unfortunately patient missed with Dr. Everlena Cooper on 07-04-20 (they did not know right appointment date).  In regards to medication he prescribes appointment was required for further follow-up  #Right hip/groin pain S: At November visit we opted to trial a course of prednisone-she previously had success with injections for arthritis of the hip.  We also offered x-ray and orthopedic referral but patient declined at that time but we did discuss she may need another injection if pain fails to improve.  Also had some medial joint line pain of the knee-suspected arthritis  Patient also had stopped taking her gabapentin and they wondered if that could contribute to her pain- she has remained off  A/P: thankfully pain much improved with prednisone. She has remained off gabpaentin   For spinal stenosis- will restart gabapentin twice daily- seemed to help with mood as well  #Left shoulder pain S: In November visit reported 1 to 2 months of pain in her shoulder in certain positions but as a limited historian we cannot get a full history.  Suspected rotator cuff first arthritis of the shoulder issues-we were hoping prednisone will be beneficial for  A/P: somewhat better - she wants to hold off on further workup    #Dizziness/low BP S: Ongoing issues with  intermittent dizziness.  Home health aide reported lower blood pressures in the past the patient was only on bisoprolol which was primarily for rate control-would be okay to hold medication short-term if blood pressure running low and heart rate is controlled  A/P: BP lower today and dizzy with standing. 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 Tomi Likens S:Patient has been compliant with Keppra 1000 mg twice daily through Dr. Tomi Likens with no recurrence of seizures  A/P: controlled- refilled keppra short term to allow to reschedule with Dr. Tomi Likens   # COPD  S: Patient reports stable shortness of breath and cough- fair amount of phlegm for months Maintenance medications: none  Patient has had to use albuterol rarely. declines oxygen at home - oxygen can range 88-92- she was 89% in office and did seem to feel a little better on oxygen in office- energy levels better A/P: COPD stable- declines oxygen or intervention  #Heart failure with preserved ejection fraction-reported by Dr. Alvester Chou 05/10/2018 S: Medication:None.  Edema: trace to 1+ Weight gain:largely stable Shortness of breath: largely stable  A/P: appears overall stable despite no lasix- continue to monitor   # Depression/agitation S: Medication:Zoloft 25 mg, buspirone 5 mg twice daily-he typically just takes once daily  -not on celexa- did not tolerate when Dr. Tomi Likens rxed- we removed from medication list  Less interested,  dozing off more, not as happy Depression screen [facility] 2/9 08/26/2020 10/16/2019 06/06/2019  Decreased Interest 3 0 1  Down, Depressed, Hopeless 3 0 0  PHQ - 2 Score 6 0 1  Altered sleeping 3 1 0  Tired, decreased energy 3 3 3   Change in appetite 0 0 1  Feeling bad or failure about yourself  3 0 0  Trouble concentrating 3 0 0  Moving slowly or fidgety/restless 0 0 0  Suicidal thoughts 0 0 0  PHQ-9 Score 18 4 5   Difficult doing work/chores Somewhat difficult Somewhat difficult Somewhat difficult  Some recent data might be hidden  A/P: poor control- discussed increasing zoloft to 50 mg- she declines for now- she wants to see how things go with increasing gabapentin first  #Aortic atherosclerosis- LDL goal under 70 #hyperlipidemia S: Medication:Atorvastatin 20 mg  A/P: I did not check lipid panel last visit as intended- consider next visit   #History of Laryngeal carcinoma (no code noted for history of).  Remote history with resection in 1986 and later radiation-chronic hoarseness related to this.  No evidence of recurrence   # mild aortic stenosis- offered repeat echo- last 2017- she declines for now  # prior elevated a1c- we can check with next bloodwork Lab Results  Component Value Date   HGBA1C 5.9 (H) 05/27/2013   # some night sweats in last 2 weeks- has had in past and has resolved- we opted to monitor and if persists can update more extensive bloodwork next visit. Does keep house warm and could be contributing - by night sweats primarily means taking covers off  Recommended follow up: Return in about 2 months (around 10/24/2020) for follow up- or sooner if needed. Future Appointments  Date Time Provider [place]  10/09/2020  2:15 PM Marzetta Board, DPM [facility]    Lab/Order associations:   ICD-10-CM   1. COPD mixed type (HCC) Chronic J44.9   2. Diastolic congestive heart failure, unspecified HF chronicity (HCC) Chronic I50.30   3. Recurrent major  depressive disorder, in full remission ([place]) Chronic F33.42   4. Laryngeal carcinoma (HCC) Chronic C32.9   5. Aortic atherosclerosis (HCC) Chronic I70.0   6. Paroxysmal atrial fibrillation (HCC) Chronic I48.0   7. 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 (HCC) Chronic G40.109     Meds ordered this encounter  Medications  . levETIRAcetam (KEPPRA) 1000 MG tablet    Sig:  Take 1 tablet (1,000 mg total) by mouth 2 (two) times daily.    Dispense:  60 tablet    Refill:  4    Short term fill until can reschedule with Dr. Tomi Likens  . gabapentin (NEURONTIN) 100 MG capsule    Sig: Take 1 capsule (100 mg total) by mouth 2 (two) times daily.    Dispense:  180 capsule    Refill:  3    Return precautions advised.  [name], MD

## 2020-08-30 ENCOUNTER — Other Ambulatory Visit: Payer: Self-pay

## 2020-08-30 ENCOUNTER — Telehealth: Payer: Self-pay

## 2020-08-30 ENCOUNTER — Telehealth: Payer: Medicare Other | Admitting: Family Medicine

## 2020-08-30 ENCOUNTER — Encounter: Payer: Self-pay | Admitting: Family Medicine

## 2020-08-30 ENCOUNTER — Telehealth (INDEPENDENT_AMBULATORY_CARE_PROVIDER_SITE_OTHER): Payer: Medicare Other | Admitting: Family Medicine

## 2020-08-30 VITALS — BP 110/60 | HR 64 | Temp 98.1°F | Ht 64.0 in | Wt 186.0 lb

## 2020-08-30 DIAGNOSIS — N3000 Acute cystitis without hematuria: Secondary | ICD-10-CM

## 2020-08-30 DIAGNOSIS — R300 Dysuria: Secondary | ICD-10-CM

## 2020-08-30 MED ORDER — CEPHALEXIN 500 MG PO CAPS: 500.0000 mg | ORAL_CAPSULE | Freq: Three times a day (TID) | ORAL | 0 refills | Status: AC

## 2020-08-30 NOTE — Progress Notes (Signed)
Phone [phone] Virtual visit via Video note   Subjective:  Chief complaint: Chief Complaint  Patient presents with  . burning with urination    Patient isn't using the bathroom regularly but when she does it's a couple of drops   This visit type was conducted due to national recommendations for restrictions regarding the COVID-19 Pandemic (e.g. social distancing).  This format is felt to be most appropriate for this patient at this time balancing risks to patient and risks to population by having him in for in person visit.  No physical exam was performed (except for noted visual exam or audio findings with Telehealth visits).    Our team/I connected with [name] at  4:40 PM EST by a video enabled telemedicine application ([email or web address] or caregility through epic) and verified that I am speaking with the correct person using two identifiers.  Location patient: [facility] Location provider: [facility], office Persons participating in the virtual visit:  patient  Our team/I discussed the limitations of evaluation and management by telemedicine and the availability of in person appointments. In light of current covid-19 pandemic, patient also understands that we are trying to protect them by minimizing in office contact if at all possible.  The patient expressed consent for telemedicine visit and agreed to proceed. Patient understands insurance will be billed.   Past Medical History-  Patient Active Problem List   Diagnosis Date Noted  . Memory loss 12/14/2019    Priority: High  . Diastolic CHF ([place]) 45/80/9983    Priority: High  . History of cardioembolic stroke 38/25/0539    Priority: High  . Spinal stenosis, lumbar region, with neurogenic claudication 05/09/2014    Priority: High  . History of Focal seizure ([place]) 06/16/2013    Priority: High  . History of TIA (transient ischemic attack) 05/31/2013    Priority: High  . History of Large B-cell lymphoma  02/18/2012     Priority: High  . Atrial fibrillation ([place]) 12/14/2008    Priority: High  . Mild aortic stenosis 08/26/2020    Priority: Medium  . Recurrent major depressive disorder, in full remission ([place]) 10/16/2019    Priority: Medium  . Agitation 03/11/2018    Priority: Medium  . History of chronic respiratory failure- prior on o2 06/15/2017    Priority: Medium  . Bacteremia due to Streptococcus 11/21/2015    Priority: Medium  . Cellulitis of leg, right 11/20/2015    Priority: Medium  . Diverticulitis of colon 09/26/2014    Priority: Medium  . CKD (chronic kidney disease), stage III ([place]) 09/26/2014    Priority: Medium  . Laryngeal carcinoma ([place])     Priority: Medium  . Anxiety state 10/20/2008    Priority: Medium  . Depression 10/20/2008    Priority: Medium  . Dyspnea on exertion 07/06/2008    Priority: Medium  . COPD GOLD II 03/26/2008    Priority: Medium  . Hyperlipidemia 03/18/2007    Priority: Medium  . Essential hypertension 03/18/2007    Priority: Medium  . Chronic diarrhea 09/04/2017    Priority: Low  . Aortic atherosclerosis ([place]) 06/30/2016    Priority: Low  . Weakness generalized 06/30/2013    Priority: Low  . Bilateral hand pain 01/09/2013    Priority: Low  . Loose stools 06/01/2019  . Hemorrhoids 06/01/2019  . Chest pain 04/25/2018  . GERD (gastroesophageal reflux disease) 05/03/2007    Medications- reviewed and updated Current Outpatient Medications  Medication Sig Dispense Refill  . albuterol (VENTOLIN HFA)  108 (90 Base) MCG/ACT inhaler TAKE 2 PUFFS BY MOUTH EVERY 6 HOURS AS NEEDED FOR WHEEZE OR SHORTNESS OF BREATH 18 g 1  . atorvastatin (LIPITOR) 20 MG tablet TAKE 1 TABLET BY MOUTH EVERY DAY 90 tablet 1  . bisoprolol (ZEBETA) 5 MG tablet TAKE 1 TABLET BY MOUTH EVERY DAY 90 tablet 1  . busPIRone ([name]) 5 MG tablet TAKE 1 TABLET BY MOUTH TWICE A DAY (Patient taking differently: 5 mg daily.) 180 tablet 3  . cephALEXin (KEFLEX) 500 MG capsule Take 1 capsule  (500 mg total) by mouth 3 (three) times daily for 7 days. 21 capsule 0  . cholecalciferol (VITAMIN D) 1000 UNITS tablet Take 1,000 Units by mouth daily.    . Cyanocobalamin (VITAMIN B-12 PO) Take 1 tablet by mouth daily.    Marland Kitchen gabapentin (NEURONTIN) 100 MG capsule Take 1 capsule (100 mg total) by mouth 2 (two) times daily. 180 capsule 3  . hydrocortisone (ANUSOL-HC) 2.5 % rectal cream Place 1 application rectally at bedtime. 30 g 0  . ketoconazole (NIZORAL) 2 % cream Apply 1 application topically daily. 15 g 0  . levETIRAcetam (KEPPRA) 1000 MG tablet Take 1 tablet (1,000 mg total) by mouth 2 (two) times daily. 60 tablet 4  . loperamide (IMODIUM) 2 MG capsule Take 2 mg by mouth daily as needed for diarrhea or loose stools.     . NONFORMULARY OR COMPOUNDED ITEM Antifungal solution: Terbinafine 3%, Fluconazole 2%, Tea Tree Oil 5%, Urea 10%, Ibuprofen 2% in DMSO suspension #97mL 1 each 3  . omeprazole (PRILOSEC) 20 MG capsule Take 20 mg by mouth daily as needed.     . sertraline (ZOLOFT) 25 MG tablet TAKE 1 TABLET BY MOUTH EVERY DAY 90 tablet 0  . triamcinolone cream (KENALOG) 0.1 % Apply 1 application topically 2 (two) times daily. For 7-10 days maximum 80 g 0  . XARELTO 15 MG TABS tablet TAKE 1 TABLET BY MOUTH EVERY DAY WITH SUPPER 30 tablet 10   No current facility-administered medications for this visit.     Objective:  BP 110/60   Pulse 64   Temp 98.1 F (36.7 C) (Temporal)   Ht 5\' 4"  (1.626 m)   Wt 186 lb (84.4 kg)   LMP  (LMP Unknown)   SpO2 91%   BMI 31.93 kg/m  self reported vitals Gen: NAD, resting comfortably Lungs: nonlabored, normal respiratory rate  Skin: appears dry, no obvious rash     Assessment and Plan   #Concern for UTI S: Patients symptoms started earlier today.  Complains of dysuria: yes; polyuria: yes frequent desire but then witholds due to pain; nocturia: yes; urgency: yes. Some pain in cva area Symptoms are worsening.  ROS- no fever, chills, nausea,  vomiting, flank pain. No blood in urine.  A/P: UTI based on symptoms. Plan was for patient to drop off urine today prior to visit but she was unable to collect a urine appropriately. Family will try to obtain on Monday and bring by at that time. We did opt to empirically treat her with Keflex for she has potential to decline without intervention over the weekend. Urinalysis and urine culture were ordered. Higher risk patient due to age and memory loss likely at least MCI  Patient to follow up if new or worsening symptoms or failure to improve.   Recommended follow up: keep march visit Future Appointments  Date Time Provider [place]  10/09/2020  2:15 PM Marzetta Board, DPM [facility]  10/28/2020  1:00 PM Marin Olp, MD [facility]    Lab/Order associations:   ICD-10-CM   1. Acute cystitis without hematuria  N30.00     Meds ordered this encounter  Medications  . cephALEXin (KEFLEX) 500 MG capsule    Sig: Take 1 capsule (500 mg total) by mouth 3 (three) times daily for 7 days.    Dispense:  21 capsule    Refill:  0   Return precautions advised.  [name], MD

## 2020-08-30 NOTE — Patient Instructions (Signed)
Health Maintenance Due  Topic Date Due  . DEXA SCAN  Never done  . COVID-19 Vaccine (3 - Pfizer risk 4-dose series) 10/02/2019    Depression screen [facility] 2/9 08/26/2020 10/16/2019 06/06/2019  Decreased Interest 3 0 1  Down, Depressed, Hopeless 3 0 0  PHQ - 2 Score 6 0 1  Altered sleeping 3 1 0  Tired, decreased energy 3 3 3   Change in appetite 0 0 1  Feeling bad or failure about yourself  3 0 0  Trouble concentrating 3 0 0  Moving slowly or fidgety/restless 0 0 0  Suicidal thoughts 0 0 0  PHQ-9 Score 18 4 5   Difficult doing work/chores Somewhat difficult Somewhat difficult Somewhat difficult  Some recent data might be hidden    Recommended follow up: No follow-ups on file.

## 2020-08-30 NOTE — Telephone Encounter (Signed)
We had video visit at end of day

## 2020-08-31 ENCOUNTER — Telehealth: Payer: Medicare Other | Admitting: Family Medicine

## 2020-09-04 ENCOUNTER — Telehealth: Payer: Self-pay | Admitting: *Deleted

## 2020-09-04 ENCOUNTER — Encounter: Payer: Self-pay | Admitting: *Deleted

## 2020-09-04 ENCOUNTER — Other Ambulatory Visit: Payer: Self-pay | Admitting: *Deleted

## 2020-09-04 ENCOUNTER — Telehealth: Payer: Self-pay

## 2020-09-04 NOTE — Telephone Encounter (Signed)
  Pt.'s daughter is slightly worried about pt. Last time she took Gabapentin she had what resembled a stroke ( lost her words) for about 15 minutes. Dr.'s last time told daughter not to worry to much about it. Pt restarted Gabapentin last week. Last night at 8 pm Pt lost her words for 10-15 mins. Daughter just wants to check if pt should continue with Gabapentin.         Osborne Oman ( daughter) [phone]

## 2020-09-04 NOTE — Telephone Encounter (Signed)
It may be overly sedating her and causing encephalopathy.  Team please add gabapentin to allergy list under confusion and take off her medication list.  Please inform patient/family

## 2020-09-04 NOTE — Telephone Encounter (Signed)
Informed Patient Daughter verbalized understanding, (with teach back information) It may be overly sedating her and causing encephalopathy.  gabapentin was add to allergy list, taken off her medication list.

## 2020-09-04 NOTE — Telephone Encounter (Signed)
See below

## 2020-09-05 ENCOUNTER — Other Ambulatory Visit: Payer: Self-pay | Admitting: Family Medicine

## 2020-09-05 NOTE — Telephone Encounter (Signed)
See telephone note.

## 2020-09-07 ENCOUNTER — Other Ambulatory Visit: Payer: Self-pay | Admitting: Family Medicine

## 2020-09-09 ENCOUNTER — Ambulatory Visit: Payer: Medicare Other | Admitting: Physician Assistant

## 2020-09-09 ENCOUNTER — Encounter: Payer: Self-pay | Admitting: Physician Assistant

## 2020-09-09 ENCOUNTER — Other Ambulatory Visit: Payer: Self-pay

## 2020-09-09 VITALS — BP 120/82 | HR 72 | Temp 98.0°F | Resp 14 | Ht 64.0 in | Wt 186.0 lb

## 2020-09-09 DIAGNOSIS — R300 Dysuria: Secondary | ICD-10-CM | POA: Diagnosis not present

## 2020-09-09 NOTE — Progress Notes (Signed)
Acute Office Visit  Subjective:    Patient ID: [name], female    DOB: 1928-04-29, 87 y.o.   MRN: [id number]  Chief Complaint  Patient presents with  . Dysuria    Had UTI about 2 weeks ago. Dr Yong Channel treated with Keflex. Had to stop early due to having some TIA symptoms.  Urinary symptoms started yesterday. Increased sleepiness, burning with urination.     HPI Patient is in today for possible UTI symptoms. She is here with her husband and her daughter. Dysuria started back up yesterday. No urgency or frequency. No hematuria or odor to urine. No N/V/D. No fever. No back pain. No abdominal pain.   Also to note - doing much better since stopping Gabapentin, no longer having TIA-like symptoms per daughter (see phone note).    Past Medical History:  Diagnosis Date  . ABDOMINAL INCISIONAL HERNIA 04/03/2010      . Anxiety   . Arthritis    "lots; hands" (05/26/2013)  . Blood transfusion    "w/1st miscarriage" (05/26/2013)  . COPD (chronic obstructive pulmonary disease) ([place])   . Coronary artery disease   . Depression   . Diverticulitis of colon with hemorrhage 07/24/2008  . DVT (deep venous thrombosis) ([place]) 1954   "after I had my first child" (05/26/2013)  . Exertional shortness of breath   . Family history of anesthesia complication    "sisters also get sick as a dog" (05/26/2013)  . GERD 05/03/2007      . GERD (gastroesophageal reflux disease)   . H/O hiatal hernia   . HCAP (healthcare-associated pneumonia) 06/26/2013  . Heart murmur    "when I was a child" (05/26/2013)  . History of TIAs   . Hyperlipidemia   . Hypertension   . Keratotic plaque   . Laryngeal carcinoma ([place]) hx of ca   remotely with resection and xrt/chronic hoarsemenss  . Lymphona, mantle cell, inguinal region/lower limb ([place])    "large c-cell" (05/26/2013)  . OSTEOARTHRITIS 05/03/2007  . PEPTIC ULCER DISEASE 07/06/2008  . Pneumonia 2013   "once" (05/26/2013)  . PUD (peptic ulcer disease)   .  PVD (peripheral vascular disease) ([place])   . Seizures ([place])   . Stroke ([place]) 05/26/2013   "very mild; affected my speech for a while" (05/26/2013)  . VARICOSE VEINS LOWER EXTREMITIES W/INFLAMMATION 01/15/2009    Past Surgical History:  Procedure Laterality Date  . ABDOMINAL EXPLORATION SURGERY  12/01/2012  . APPENDECTOMY  09/13/2008  . CAROTID ENDARTERECTOMY Bilateral 2004   2004 a month apart right and left  . CARPAL TUNNEL RELEASE Left ?1960's  . CATARACT EXTRACTION  2005   bilateral  . COLON SURGERY  09/13/2008   Laparoscopic assisted converted to open sigmoid colectomy.Archie Endo 09/13/2008 (05/26/2013)  . DILATION AND CURETTAGE OF UTERUS  1950's   "had 2 for miscarriages" (05/26/2013)  . DIVERTING ILEOSTOMY  09/22/2008   iliostomy for diverticulitis/notes 09/22/2008 (05/26/2013)  . HERNIA REPAIR  05/2010   ventral hernia repair  . ILEOSTOMY CLOSURE  01/2010  . RADICAL NECK DISSECTION  1986   "larynx cancer" (05/26/2013)  . TONSILLECTOMY AND ADENOIDECTOMY  1936   "tonsils grew back; no 2nd OR" (05/26/2013)  . TOTAL KNEE ARTHROPLASTY Bilateral    2002 left 2008 right  . TUBAL LIGATION  1972    Family History  Problem Relation Age of Onset  . Colon cancer Sister        colon  . Hyperlipidemia Mother   . Stroke Mother   .  Cancer Father        mesothelioma    Social History   Socioeconomic History  . Marital status: Married    Spouse name: Percell Miller  . Number of children: 6  . Years of education: Not on file  . Highest education level: Not on file  Occupational History  . Occupation: retired    Fish farm manager: RETIRED  Tobacco Use  . Smoking status: Former Smoker    Packs/day: 2.50    Years: 37.00    Pack years: 92.50    Types: Cigarettes    Quit date: 09/05/1984    Years since quitting: 36.0  . Smokeless tobacco: Never Used  . Tobacco comment: quit when she was dx with cancer  Vaping Use  . Vaping Use: Never used  Substance and Sexual Activity  . Alcohol use: No     Alcohol/week: 0.0 standard drinks  . Drug use: No  . Sexual activity: Not Currently  Other Topics Concern  . Not on file  Social History Narrative   Married (husband patient of Dr. Yong Channel). 6 children (lost 2 for total 8). 15 grandkids. 6 greatgrandchildren.    Lives with husband. Husband drives. Patient does not drive.       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    Social Determinants of Health   Financial Resource Strain: Not on file  Food Insecurity: Not on file  Transportation Needs: Not on file  Physical Activity: Not on file  Stress: Not on file  Social Connections: Not on file  Intimate Partner Violence: Not on file    Outpatient Medications Prior to Visit  Medication Sig Dispense Refill  . albuterol (VENTOLIN HFA) 108 (90 Base) MCG/ACT inhaler TAKE 2 PUFFS BY MOUTH EVERY 6 HOURS AS NEEDED FOR WHEEZE OR SHORTNESS OF BREATH 18 g 1  . atorvastatin (LIPITOR) 20 MG tablet TAKE 1 TABLET BY MOUTH EVERY DAY 90 tablet 1  . bisoprolol (ZEBETA) 5 MG tablet TAKE 1 TABLET BY MOUTH EVERY DAY 90 tablet 1  . busPIRone ([name]) 5 MG tablet TAKE 1 TABLET BY MOUTH TWICE A DAY (Patient taking differently: 5 mg daily.) 180 tablet 3  . cholecalciferol (VITAMIN D) 1000 UNITS tablet Take 1,000 Units by mouth daily.    . Cyanocobalamin (VITAMIN B-12 PO) Take 1 tablet by mouth daily.    . hydrocortisone (ANUSOL-HC) 2.5 % rectal cream Place 1 application rectally at bedtime. 30 g 0  . ketoconazole (NIZORAL) 2 % cream Apply 1 application topically daily. 15 g 0  . levETIRAcetam (KEPPRA) 1000 MG tablet Take 1 tablet (1,000 mg total) by mouth 2 (two) times daily. 60 tablet 4  . loperamide (IMODIUM) 2 MG capsule Take 2 mg by mouth daily as needed for diarrhea or loose stools.     . NONFORMULARY OR COMPOUNDED ITEM Antifungal solution: Terbinafine 3%, Fluconazole 2%, Tea Tree Oil 5%, Urea 10%, Ibuprofen 2% in DMSO suspension #40mL 1 each 3  .  omeprazole (PRILOSEC) 20 MG capsule Take 20 mg by mouth daily as needed.     . sertraline (ZOLOFT) 25 MG tablet TAKE 1 TABLET BY MOUTH EVERY DAY 90 tablet 0  . triamcinolone cream (KENALOG) 0.1 % Apply 1 application topically 2 (two) times daily. For 7-10 days maximum 80 g 0  . XARELTO 15 MG TABS tablet TAKE 1 TABLET BY MOUTH EVERY DAY WITH SUPPER 30 tablet 10   No facility-administered medications  prior to visit.    Allergies  Allergen Reactions  . Ativan [Lorazepam] Other (See Comments)    Severe hallucinations   . Gabapentin     encephalopathy  . Propoxyphene Hcl Nausea And Vomiting  . Codeine Itching  . Penicillins Rash    Has patient had a PCN reaction causing immediate rash, facial/tongue/throat swelling, SOB or lightheadedness with hypotension: Yes Has patient had a PCN reaction causing severe rash involving mucus membranes or skin necrosis: Unk Has patient had a PCN reaction that required hospitalization: No Has patient had a PCN reaction occurring within the last 10 years: Yes If all of the above answers are "NO", then may proceed with Cephalosporin use.   . Vancomycin Rash    Review of Systems  SEE HPI    Objective:    Physical Exam Vitals and nursing note reviewed.  Constitutional:      General: She is not in acute distress.    Appearance: Normal appearance. She is not ill-appearing.  HENT:     Head: Normocephalic and atraumatic.  Cardiovascular:     Rate and Rhythm: Normal rate and regular rhythm.     Pulses: Normal pulses.     Heart sounds: Murmur heard.    Pulmonary:     Effort: Pulmonary effort is normal.     Breath sounds: Normal breath sounds.  Abdominal:     General: Abdomen is flat. Bowel sounds are normal.     Palpations: Abdomen is soft.     Tenderness: There is no abdominal tenderness. There is no right CVA tenderness, left CVA tenderness or rebound.  Skin:    General: Skin is warm and dry.  Neurological:     General: No focal deficit  present.     Mental Status: She is alert.  Psychiatric:        Mood and Affect: Mood normal.     BP 120/82   Pulse 72   Temp 98 F (36.7 C) (Temporal)   Resp 14   Ht 5\' 4"  (1.626 m)   Wt 186 lb (84.4 kg)   LMP  (LMP Unknown)   SpO2 91%   BMI 31.93 kg/m  Wt Readings from Last 3 Encounters:  09/09/20 186 lb (84.4 kg)  08/30/20 186 lb (84.4 kg)  08/26/20 186 lb 9.6 oz (84.6 kg)       Assessment & Plan:   Problem List Items Addressed This Visit   None   Visit Diagnoses    Dysuria    -  Primary   Relevant Orders   POCT Urinalysis Dipstick   Urine Culture     1. Dysuria Likely acute cystitis, uncomplicated. Will send U/A and U/C once specimen received. She will restart the leftover cephalexin from Dr. Yong Channel (500 mg TID) until culture back. Will send more if appropriate based on sensitivity report.    Adaysha Dubinsky M Matricia Begnaud, PA-C

## 2020-09-09 NOTE — Patient Instructions (Signed)
**Please return urine sample ASAP and we will send off for culture. **May resume cephalexin from Dr. Yong Channel as soon as urine sample is provided.  I will call with urine culture report and change antibiotic if necessary.  Continue to push fluids, including water and cranberry juice.     Urinary Tract Infection, Adult A urinary tract infection (UTI) is an infection of any part of the urinary tract. The urinary tract includes:  The kidneys.  The ureters.  The bladder.  The urethra. These organs make, store, and get rid of pee (urine) in the body. What are the causes? This infection is caused by germs (bacteria) in your genital area. These germs grow and cause swelling (inflammation) of your urinary tract. What increases the risk? The following factors may make you more likely to develop this condition:  Using a small, thin tube (catheter) to drain pee.  Not being able to control when you pee or poop (incontinence).  Being female. If you are female, these things can increase the risk: ? Using these methods to prevent pregnancy:  A medicine that kills sperm (spermicide).  A device that blocks sperm (diaphragm). ? Having low levels of a female hormone (estrogen). ? Being pregnant. You are more likely to develop this condition if:  You have genes that add to your risk.  You are sexually active.  You take antibiotic medicines.  You have trouble peeing because of: ? A prostate that is bigger than normal, if you are female. ? A blockage in the part of your body that drains pee from the bladder. ? A kidney stone. ? A nerve condition that affects your bladder. ? Not getting enough to drink. ? Not peeing often enough.  You have other conditions, such as: ? Diabetes. ? A weak disease-fighting system (immune system). ? Sickle cell disease. ? Gout. ? Injury of the spine. What are the signs or symptoms? Symptoms of this condition include:  Needing to pee right  away.  Peeing small amounts often.  Pain or burning when peeing.  Blood in the pee.  Pee that smells bad or not like normal.  Trouble peeing.  Pee that is cloudy.  Fluid coming from the vagina, if you are female.  Pain in the belly or lower back. Other symptoms include:  Vomiting.  Not feeling hungry.  Feeling mixed up (confused). This may be the first symptom in older adults.  Being tired and grouchy (irritable).  A fever.  Watery poop (diarrhea). How is this treated?  Taking antibiotic medicine.  Taking other medicines.  Drinking enough water. In some cases, you may need to see a specialist. Follow these instructions at home: Medicines  Take over-the-counter and prescription medicines only as told by your doctor.  If you were prescribed an antibiotic medicine, take it as told by your doctor. Do not stop taking it even if you start to feel better. General instructions  Make sure you: ? Pee until your bladder is empty. ? Do not hold pee for a long time. ? Empty your bladder after sex. ? Wipe from front to back after peeing or pooping if you are a female. Use each tissue one time when you wipe.  Drink enough fluid to keep your pee pale yellow.  Keep all follow-up visits.   Contact a doctor if:  You do not get better after 1-2 days.  Your symptoms go away and then come back. Get help right away if:  You have very bad back pain.  You have very bad pain in your lower belly.  You have a fever.  You have chills.  You feeling like you will vomit or you vomit. Summary  A urinary tract infection (UTI) is an infection of any part of the urinary tract.  This condition is caused by germs in your genital area.  There are many risk factors for a UTI.  Treatment includes antibiotic medicines.  Drink enough fluid to keep your pee pale yellow. This information is not intended to replace advice given to you by your health care provider. Make sure you  discuss any questions you have with your health care provider. Document Revised: 03/01/2020 Document Reviewed: 03/01/2020 Elsevier Patient Education  [place].

## 2020-09-10 LAB — POCT URINALYSIS DIPSTICK
Bilirubin, UA: NEGATIVE
Glucose, UA: NEGATIVE
Ketones, UA: NEGATIVE
Leukocytes, UA: NEGATIVE
Nitrite, UA: NEGATIVE
Protein, UA: POSITIVE — AB
Spec Grav, UA: 1.02 (ref 1.010–1.025)
Urobilinogen, UA: 0.2 E.U./dL
pH, UA: 5 (ref 5.0–8.0)

## 2020-09-10 NOTE — Addendum Note (Signed)
Addended by: Adah Salvage F on: 09/10/2020 08:45 AM   Modules accepted: Orders

## 2020-09-12 LAB — URINE CULTURE
MICRO NUMBER:: 11508273
SPECIMEN QUALITY:: ADEQUATE

## 2020-10-01 ENCOUNTER — Other Ambulatory Visit: Payer: Self-pay | Admitting: Neurology

## 2020-10-09 ENCOUNTER — Ambulatory Visit: Payer: Medicare Other | Admitting: Podiatry

## 2020-10-11 ENCOUNTER — Other Ambulatory Visit: Payer: Self-pay | Admitting: Family Medicine

## 2020-10-21 ENCOUNTER — Ambulatory Visit: Payer: Medicare Other | Admitting: Physician Assistant

## 2020-10-21 ENCOUNTER — Other Ambulatory Visit: Payer: Self-pay

## 2020-10-21 ENCOUNTER — Telehealth: Payer: Self-pay

## 2020-10-21 ENCOUNTER — Encounter: Payer: Self-pay | Admitting: Physician Assistant

## 2020-10-21 VITALS — BP 110/60 | HR 83 | Temp 97.6°F | Ht 64.0 in | Wt 191.2 lb

## 2020-10-21 DIAGNOSIS — B029 Zoster without complications: Secondary | ICD-10-CM | POA: Diagnosis not present

## 2020-10-21 MED ORDER — VALACYCLOVIR HCL 1 G PO TABS: 1000.0000 mg | ORAL_TABLET | Freq: Three times a day (TID) | ORAL | 0 refills | Status: AC

## 2020-10-21 MED ORDER — PREDNISONE 20 MG PO TABS: 20.0000 mg | ORAL_TABLET | Freq: Two times a day (BID) | ORAL | 0 refills | Status: AC

## 2020-10-21 NOTE — Telephone Encounter (Signed)
Patient states that her mother in 87 yo and the side effects of the medication is seizures and she's worried. She very upset because she felt neglected from [facility]

## 2020-10-21 NOTE — Telephone Encounter (Signed)
Im confused- valtrex is the treatment for shingles. The chart documents it as shingles. The other treatment would be acyclovir- was there a specific issue with valtrex in the past? Valtrex is not listed as an allergy

## 2020-10-21 NOTE — Telephone Encounter (Signed)
The alternates also have seizure as potential side effect (though risk overall is low). If they would prefer for her to not take the medicine due to risk that's ok but we do not have alternate treatment  I am sorry they feel that way but I know [name] provides excellent care. I see they have a visit scheduled tomorrow- do they still want to keep that visit and discuss concerns more? Delaying valtrex being started can make it ineffective in certain situations

## 2020-10-21 NOTE — Patient Instructions (Signed)
You have shingles. You will need to keep the area covered until new lesions stop appearing. Please take the Valtrex and Prednisone as directed. You may apply lidocaine gel for some relief. Consider Shingrix vaccine once this is resolved.   Please call with an update if still having pain or symptoms in the next 1-2 weeks.      Shingles  Shingles is an infection. It gives you a painful skin rash and blisters that have fluid in them. Shingles is caused by the same germ (virus) that causes chickenpox. Shingles only happens in people who:  Have had chickenpox.  Have been given a shot of medicine (vaccine) to protect against chickenpox. Shingles is rare in this group. The first symptoms of shingles may be itching, tingling, or pain in an area on your skin. A rash will show on your skin a few days or weeks later. The rash is likely to be on one side of your body. The rash usually has a shape like a belt or a band. Over time, the rash turns into fluid-filled blisters. The blisters will break open, change into scabs, and dry up. Medicines may:  Help with pain and itching.  Help you get better sooner.  Help to prevent long-term problems. Follow these instructions at home: Medicines  Take over-the-counter and prescription medicines only as told by your doctor.  Put on an anti-itch cream or numbing cream where you have a rash, blisters, or scabs. Do this as told by your doctor. Helping with itching and discomfort  Put cold, wet cloths (cold compresses) on the area of the rash or blisters as told by your doctor.  Cool baths can help you feel better. Try adding baking soda or dry oatmeal to the water to lessen itching. Do not bathe in hot water.   Blister and rash care  Keep your rash covered with a loose bandage (dressing).  Wear loose clothing that does not rub on your rash.  Keep your rash and blisters clean. To do this, wash the area with mild soap and cool water as told by your  doctor.  Check your rash every day for signs of infection. Check for: ? More redness, swelling, or pain. ? Fluid or blood. ? Warmth. ? Pus or a bad smell.  Do not scratch your rash. Do not pick at your blisters. To help you to not scratch: ? Keep your fingernails clean and cut short. ? Wear gloves or mittens when you sleep, if scratching is a problem. General instructions  Rest as told by your doctor.  Keep all follow-up visits as told by your doctor. This is important.  Wash your hands often with soap and water. If soap and water are not available, use hand sanitizer. Doing this lowers your chance of getting a skin infection caused by germs (bacteria).  Your infection can cause chickenpox in people who have never had chickenpox or never got a shot of chickenpox vaccine. If you have blisters that did not change into scabs yet, try not to touch other people or be around other people, especially: ? Babies. ? Pregnant women. ? Children who have areas of red, itchy, or rough skin (eczema). ? Very old people who have transplants. ? People who have a long-term (chronic) sickness, like cancer or AIDS. Contact a doctor if:  Your pain does not get better with medicine.  Your pain does not get better after the rash heals.  You have any signs of infection in the rash area.  These signs include: ? More redness, swelling, or pain around the rash. ? Fluid or blood coming from the rash. ? The rash area feeling warm to the touch. ? Pus or a bad smell coming from the rash. Get help right away if:  The rash is on your face or nose.  You have pain in your face or pain by your eye.  You lose feeling on one side of your face.  You have trouble seeing.  You have ear pain, or you have ringing in your ear.  You have a loss of taste.  Your condition gets worse. Summary  Shingles gives you a painful skin rash and blisters that have fluid in them.  Shingles is an infection. It is caused by  the same germ (virus) that causes chickenpox.  Keep your rash covered with a loose bandage (dressing). Wear loose clothing that does not rub on your rash.  If you have blisters that did not change into scabs yet, try not to touch other people or be around people. This information is not intended to replace advice given to you by your health care provider. Make sure you discuss any questions you have with your health care provider. Document Revised: 11/11/2018 Document Reviewed: 03/24/2017 Elsevier Patient Education  2021 [employer].

## 2020-10-21 NOTE — Telephone Encounter (Signed)
Please advise 

## 2020-10-21 NOTE — Progress Notes (Signed)
Acute Office Visit  Subjective:    Patient ID: [name], female    DOB: 04/13/1928, 87 y.o.   MRN: [id number]  Chief Complaint  Patient presents with  . Rash    HPI Patient is in today for rash lower abdomen x 1 week. She states it started with a small patch on the front of her abdomen and has now spread around to the back. Only on the left side. It does not itch. It is painful and she has been more tired than normal. Denies any fever, chills, myalgias, SOB, or chest pain.   Past Medical History:  Diagnosis Date  . ABDOMINAL INCISIONAL HERNIA 04/03/2010      . Anxiety   . Arthritis    "lots; hands" (05/26/2013)  . Blood transfusion    "w/1st miscarriage" (05/26/2013)  . COPD (chronic obstructive pulmonary disease) ([place])   . Coronary artery disease   . Depression   . Diverticulitis of colon with hemorrhage 07/24/2008  . DVT (deep venous thrombosis) ([place]) 1954   "after I had my first child" (05/26/2013)  . Exertional shortness of breath   . Family history of anesthesia complication    "sisters also get sick as a dog" (05/26/2013)  . GERD 05/03/2007      . GERD (gastroesophageal reflux disease)   . H/O hiatal hernia   . HCAP (healthcare-associated pneumonia) 06/26/2013  . Heart murmur    "when I was a child" (05/26/2013)  . History of TIAs   . Hyperlipidemia   . Hypertension   . Keratotic plaque   . Laryngeal carcinoma ([place]) hx of ca   remotely with resection and xrt/chronic hoarsemenss  . Lymphona, mantle cell, inguinal region/lower limb ([place])    "large c-cell" (05/26/2013)  . OSTEOARTHRITIS 05/03/2007  . PEPTIC ULCER DISEASE 07/06/2008  . Pneumonia 2013   "once" (05/26/2013)  . PUD (peptic ulcer disease)   . PVD (peripheral vascular disease) ([place])   . Seizures ([place])   . Stroke ([place]) 05/26/2013   "very mild; affected my speech for a while" (05/26/2013)  . VARICOSE VEINS LOWER EXTREMITIES W/INFLAMMATION 01/15/2009    Past Surgical History:  Procedure  Laterality Date  . ABDOMINAL EXPLORATION SURGERY  12/01/2012  . APPENDECTOMY  09/13/2008  . CAROTID ENDARTERECTOMY Bilateral 2004   2004 a month apart right and left  . CARPAL TUNNEL RELEASE Left ?1960's  . CATARACT EXTRACTION  2005   bilateral  . COLON SURGERY  09/13/2008   Laparoscopic assisted converted to open sigmoid colectomy.Archie Endo 09/13/2008 (05/26/2013)  . DILATION AND CURETTAGE OF UTERUS  1950's   "had 2 for miscarriages" (05/26/2013)  . DIVERTING ILEOSTOMY  09/22/2008   iliostomy for diverticulitis/notes 09/22/2008 (05/26/2013)  . HERNIA REPAIR  05/2010   ventral hernia repair  . ILEOSTOMY CLOSURE  01/2010  . RADICAL NECK DISSECTION  1986   "larynx cancer" (05/26/2013)  . TONSILLECTOMY AND ADENOIDECTOMY  1936   "tonsils grew back; no 2nd OR" (05/26/2013)  . TOTAL KNEE ARTHROPLASTY Bilateral    2002 left 2008 right  . TUBAL LIGATION  1972    Family History  Problem Relation Age of Onset  . Colon cancer Sister        colon  . Hyperlipidemia Mother   . Stroke Mother   . Cancer Father        mesothelioma    Social History   Socioeconomic History  . Marital status: Married    Spouse name: Percell Miller  . Number of children: 6  .  Years of education: Not on file  . Highest education level: Not on file  Occupational History  . Occupation: retired    Fish farm manager: RETIRED  Tobacco Use  . Smoking status: Former Smoker    Packs/day: 2.50    Years: 37.00    Pack years: 92.50    Types: Cigarettes    Quit date: 09/05/1984    Years since quitting: 36.1  . Smokeless tobacco: Never Used  . Tobacco comment: quit when she was dx with cancer  Vaping Use  . Vaping Use: Never used  Substance and Sexual Activity  . Alcohol use: No    Alcohol/week: 0.0 standard drinks  . Drug use: No  . Sexual activity: Not Currently  Other Topics Concern  . Not on file  Social History Narrative   Married (husband patient of Dr. Yong Channel). 6 children (lost 2 for total 8). 15 grandkids. 6  greatgrandchildren.    Lives with husband. Husband drives. Patient does not drive.       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    Social Determinants of Health   Financial Resource Strain: Not on file  Food Insecurity: Not on file  Transportation Needs: Not on file  Physical Activity: Not on file  Stress: Not on file  Social Connections: Not on file  Intimate Partner Violence: Not on file    Outpatient Medications Prior to Visit  Medication Sig Dispense Refill  . albuterol (VENTOLIN HFA) 108 (90 Base) MCG/ACT inhaler TAKE 2 PUFFS BY MOUTH EVERY 6 HOURS AS NEEDED FOR WHEEZE OR SHORTNESS OF BREATH 18 g 1  . atorvastatin (LIPITOR) 20 MG tablet TAKE 1 TABLET BY MOUTH EVERY DAY 90 tablet 1  . bisoprolol (ZEBETA) 5 MG tablet TAKE 1 TABLET BY MOUTH EVERY DAY 90 tablet 1  . busPIRone ([name]) 5 MG tablet TAKE 1 TABLET BY MOUTH TWICE A DAY (Patient taking differently: 5 mg daily.) 180 tablet 3  . cholecalciferol (VITAMIN D) 1000 UNITS tablet Take 1,000 Units by mouth daily.    . Cyanocobalamin (VITAMIN B-12 PO) Take 1 tablet by mouth daily.    . hydrocortisone (ANUSOL-HC) 2.5 % rectal cream Place 1 application rectally at bedtime. 30 g 0  . ketoconazole (NIZORAL) 2 % cream Apply 1 application topically daily. 15 g 0  . levETIRAcetam (KEPPRA) 1000 MG tablet Take 1 tablet (1,000 mg total) by mouth 2 (two) times daily. 60 tablet 4  . loperamide (IMODIUM) 2 MG capsule Take 2 mg by mouth daily as needed for diarrhea or loose stools.     . NONFORMULARY OR COMPOUNDED ITEM Antifungal solution: Terbinafine 3%, Fluconazole 2%, Tea Tree Oil 5%, Urea 10%, Ibuprofen 2% in DMSO suspension #28mL 1 each 3  . omeprazole (PRILOSEC) 20 MG capsule Take 20 mg by mouth daily as needed.     . sertraline (ZOLOFT) 25 MG tablet TAKE 1 TABLET BY MOUTH EVERY DAY 90 tablet 0  . triamcinolone cream (KENALOG) 0.1 % Apply 1 application topically 2 (two) times  daily. For 7-10 days maximum 80 g 0  . XARELTO 15 MG TABS tablet TAKE 1 TABLET BY MOUTH EVERY DAY WITH SUPPER 30 tablet 10   No facility-administered medications prior to visit.    Allergies  Allergen Reactions  . Ativan [Lorazepam] Other (See Comments)    Severe hallucinations   . Gabapentin     encephalopathy  . Propoxyphene Hcl Nausea And Vomiting  .  Codeine Itching  . Penicillins Rash    Has patient had a PCN reaction causing immediate rash, facial/tongue/throat swelling, SOB or lightheadedness with hypotension: Yes Has patient had a PCN reaction causing severe rash involving mucus membranes or skin necrosis: Unk Has patient had a PCN reaction that required hospitalization: No Has patient had a PCN reaction occurring within the last 10 years: Yes If all of the above answers are "NO", then may proceed with Cephalosporin use.   . Vancomycin Rash    Review of Systems REFER TO HPI FOR PERTINENT POSITIVES AND NEGATIVES     Objective:    Physical Exam Vitals and nursing note reviewed.  Constitutional:      Appearance: Normal appearance.  Cardiovascular:     Rate and Rhythm: Normal rate and regular rhythm.     Pulses: Normal pulses.     Heart sounds: Normal heart sounds.  Pulmonary:     Effort: Pulmonary effort is normal.     Breath sounds: Normal breath sounds.  Skin:    Comments: LEFT SIDED AROUND T10 - T11 DERMATOMES WIDE SPREAD ERYTHEMATOUS PATCHES WITH SOME OVERLYING CRUSTING, DRYNESS, FEW VESICLES PRESENT  Neurological:     Mental Status: She is alert.     BP 110/60   Pulse 83   Temp 97.6 F (36.4 C)   Ht 5\' 4"  (1.626 m)   Wt 191 lb 3.2 oz (86.7 kg)   LMP  (LMP Unknown)   SpO2 90%   BMI 32.82 kg/m  Wt Readings from Last 3 Encounters:  10/21/20 191 lb 3.2 oz (86.7 kg)  09/09/20 186 lb (84.4 kg)  08/30/20 186 lb (84.4 kg)       Assessment & Plan:   Problem List Items Addressed This Visit   None   Visit Diagnoses    Herpes zoster without  complication    -  Primary   Relevant Medications   valACYclovir (VALTREX) 1000 MG tablet       Meds ordered this encounter  Medications  . valACYclovir (VALTREX) 1000 MG tablet    Sig: Take 1 tablet (1,000 mg total) by mouth 3 (three) times daily for 7 days.    Dispense:  21 tablet    Refill:  0  . predniSONE (DELTASONE) 20 MG tablet    Sig: Take 1 tablet (20 mg total) by mouth 2 (two) times daily with a meal for 5 days.    Dispense:  10 tablet    Refill:  0   1. Herpes zoster without complication See AVS - take medications as directed. She will call back if worsening or no improvement of pain. She has not had Shingrix per my review of her immunizations.  This visit occurred during the SARS-CoV-2 public health emergency.  Safety protocols were in place, including screening questions prior to the visit, additional usage of staff PPE, and extensive cleaning of exam room while observing appropriate contact time as indicated for disinfecting solutions.   Mubashir Mallek M Arthurine Oleary, PA-C

## 2020-10-21 NOTE — Telephone Encounter (Signed)
valACYclovir (VALTREX) 1000 MG tablet  Patients daughter  Was seen by Yetta Flock 3/21and prescribed medication and does not want to give mother medication due to side effects and would like to see if we could send something else in due to her past issues with different medications,  Also daughter would like conformation if patient has shingles or not when being seen daughter never got official confirmation that is what she had.  Please advise  And also patient would like another appt with [facility] when he is back in office due to them not being able " to discuss all the issues "

## 2020-10-22 ENCOUNTER — Ambulatory Visit: Payer: Medicare Other | Admitting: Family Medicine

## 2020-10-22 ENCOUNTER — Encounter: Payer: Self-pay | Admitting: Family Medicine

## 2020-10-22 VITALS — BP 133/83 | HR 97 | Temp 97.9°F | Ht 64.0 in | Wt 180.2 lb

## 2020-10-22 DIAGNOSIS — B029 Zoster without complications: Secondary | ICD-10-CM

## 2020-10-22 DIAGNOSIS — R251 Tremor, unspecified: Secondary | ICD-10-CM

## 2020-10-22 MED ORDER — LIDOCAINE 5 % EX OINT: 1.0000 "application " | TOPICAL_OINTMENT | CUTANEOUS | 0 refills | Status: DC | PRN

## 2020-10-22 NOTE — Telephone Encounter (Signed)
Yes she wants to discuss further today in office at the visit.

## 2020-10-22 NOTE — Progress Notes (Signed)
Phone [phone] In person visit   Subjective:   [name] is a 87 y.o. year old very pleasant female patient who presents for/with See problem oriented charting Chief Complaint  Patient presents with  . Tremors  . No appetite    Not drinking   . Medication Problem    Discuss valtrex   This visit occurred during the SARS-CoV-2 public health emergency.  Safety protocols were in place, including screening questions prior to the visit, additional usage of staff PPE, and extensive cleaning of exam room while observing appropriate contact time as indicated for disinfecting solutions.   Past Medical History-  Patient Active Problem List   Diagnosis Date Noted  . Memory loss 12/14/2019    Priority: High  . Diastolic CHF ([place]) 74/16/3845    Priority: High  . History of cardioembolic stroke 36/46/8032    Priority: High  . Spinal stenosis, lumbar region, with neurogenic claudication 05/09/2014    Priority: High  . History of Focal seizure ([place]) 06/16/2013    Priority: High  . History of TIA (transient ischemic attack) 05/31/2013    Priority: High  . History of Large B-cell lymphoma  02/18/2012    Priority: High  . Atrial fibrillation ([place]) 12/14/2008    Priority: High  . Mild aortic stenosis 08/26/2020    Priority: Medium  . Recurrent major depressive disorder, in full remission ([place]) 10/16/2019    Priority: Medium  . Agitation 03/11/2018    Priority: Medium  . History of chronic respiratory failure- prior on o2 06/15/2017    Priority: Medium  . Bacteremia due to Streptococcus 11/21/2015    Priority: Medium  . Cellulitis of leg, right 11/20/2015    Priority: Medium  . Diverticulitis of colon 09/26/2014    Priority: Medium  . CKD (chronic kidney disease), stage III ([place]) 09/26/2014    Priority: Medium  . Laryngeal carcinoma ([place])     Priority: Medium  . Anxiety state 10/20/2008    Priority: Medium  . Depression 10/20/2008    Priority: Medium  . Dyspnea on  exertion 07/06/2008    Priority: Medium  . COPD GOLD II 03/26/2008    Priority: Medium  . Hyperlipidemia 03/18/2007    Priority: Medium  . Essential hypertension 03/18/2007    Priority: Medium  . Chronic diarrhea 09/04/2017    Priority: Low  . Aortic atherosclerosis ([place]) 06/30/2016    Priority: Low  . Weakness generalized 06/30/2013    Priority: Low  . Bilateral hand pain 01/09/2013    Priority: Low  . Loose stools 06/01/2019  . Hemorrhoids 06/01/2019  . Chest pain 04/25/2018  . GERD (gastroesophageal reflux disease) 05/03/2007    Medications- reviewed and updated Current Outpatient Medications  Medication Sig Dispense Refill  . albuterol (VENTOLIN HFA) 108 (90 Base) MCG/ACT inhaler TAKE 2 PUFFS BY MOUTH EVERY 6 HOURS AS NEEDED FOR WHEEZE OR SHORTNESS OF BREATH 18 g 1  . atorvastatin (LIPITOR) 20 MG tablet TAKE 1 TABLET BY MOUTH EVERY DAY 90 tablet 1  . bisoprolol (ZEBETA) 5 MG tablet TAKE 1 TABLET BY MOUTH EVERY DAY 90 tablet 1  . busPIRone ([name]) 5 MG tablet TAKE 1 TABLET BY MOUTH TWICE A DAY (Patient taking differently: 5 mg daily.) 180 tablet 3  . cholecalciferol (VITAMIN D) 1000 UNITS tablet Take 1,000 Units by mouth daily.    . Cyanocobalamin (VITAMIN B-12 PO) Take 1 tablet by mouth daily.    . hydrocortisone (ANUSOL-HC) 2.5 % rectal cream Place 1 application rectally at  bedtime. 30 g 0  . ketoconazole (NIZORAL) 2 % cream Apply 1 application topically daily. 15 g 0  . levETIRAcetam (KEPPRA) 1000 MG tablet Take 1 tablet (1,000 mg total) by mouth 2 (two) times daily. 60 tablet 4  . lidocaine (XYLOCAINE) 5 % ointment Apply 1 application topically as needed for moderate pain (related to shingles). 240 g 0  . loperamide (IMODIUM) 2 MG capsule Take 2 mg by mouth daily as needed for diarrhea or loose stools.     . NONFORMULARY OR COMPOUNDED ITEM Antifungal solution: Terbinafine 3%, Fluconazole 2%, Tea Tree Oil 5%, Urea 10%, Ibuprofen 2% in DMSO suspension #87mL 1 each 3  .  omeprazole (PRILOSEC) 20 MG capsule Take 20 mg by mouth daily as needed.     . sertraline (ZOLOFT) 25 MG tablet TAKE 1 TABLET BY MOUTH EVERY DAY 90 tablet 0  . triamcinolone cream (KENALOG) 0.1 % Apply 1 application topically 2 (two) times daily. For 7-10 days maximum 80 g 0  . XARELTO 15 MG TABS tablet TAKE 1 TABLET BY MOUTH EVERY DAY WITH SUPPER 30 tablet 10  . predniSONE (DELTASONE) 20 MG tablet Take 1 tablet (20 mg total) by mouth 2 (two) times daily with a meal for 5 days. (Patient not taking: Reported on 10/22/2020) 10 tablet 0  . valACYclovir (VALTREX) 1000 MG tablet Take 1 tablet (1,000 mg total) by mouth 3 (three) times daily for 7 days. (Patient not taking: Reported on 10/22/2020) 21 tablet 0   No current facility-administered medications for this visit.     Objective:  BP 133/83   Pulse 97   Temp 97.9 F (36.6 C) (Temporal)   Ht 5\' 4"  (1.626 m)   Wt 180 lb 3.2 oz (81.7 kg)   LMP  (LMP Unknown)   SpO2 96%   BMI 30.93 kg/m  Gen: NAD, resting comfortably CV: RRR no murmurs rubs or gallops Lungs: CTAB no crackles, wheeze, rhonchi Abdomen: soft/nontender/nondistended/normal bowel sounds. No rebound or guarding.  Ext: no edema Skin: warm, dry, extensive erythematous rash on left side of abdomen crossing around to the left low back-no extension past the midline on the back or abdomen     Assessment and Plan   #Shingles S:New  rash starting on at least Saturday morning (likely started on Thursday per family but they did not formally see until Saturday morning). Rash did not cross midline- was on left of abdomen and then circled around to the back.  No new lesions reported since yesterday.Not sleeping well.  Calamine lotion has been tried.  There is a very small risk they are worried about potential seizure risk on Valtrex and patient/family do not want to take medication. A/P: Patient and family want to avoid Valtrex due to possible seizure risk-we discussed this is a very low risk  and would be reasonable to take medicine-other medicines like famciclovir and acyclovir have similar risks and or not improved alternatives-they would like to try topical options for pain control and we discussed possibly lidocaine ointment which was prescribed though with area affected may be difficult to treat.  Can also try Tylenol arthritis  #Tremors S: Family has noted more tremors over last 3-4 weeks in particular but higher over last 6 months. Mostly in her hands.  Appear both at rest and with intention A/P: With ongoing tremors and recent worsening in no recent visit to neurology recommended follow-up with Dr. Weber Cooks agreed to call to schedule    Recommended follow up: as needed for  new or worsening symptoms and we should probably check in every 6 months   Lab/Order associations:   ICD-10-CM   1. Herpes zoster without complication  G88.1   2. Tremulousness  R25.1     Meds ordered this encounter  Medications  . lidocaine (XYLOCAINE) 5 % ointment    Sig: Apply 1 application topically as needed for moderate pain (related to shingles).    Dispense:  240 g    Refill:  0    Time Spent: 21 minutes of total time (11:50 AM- 12:11 PM) was spent on the date of the encounter performing the following actions: chart review prior to seeing the patient, obtaining history, performing a medically necessary exam, counseling on the treatment plan, placing orders, and documenting in our EHR.   Return precautions advised.  [name], MD

## 2020-10-22 NOTE — Patient Instructions (Addendum)
Call neurology for follow up of tremors  Lets try tylenol- tylenol arthritis has an 8 hour option which would be reasonable  Try lidocaine topically perhaps in one segment at a time to make sure she tolerates it  Recommended follow up: as needed for new or worsening symptoms and we should probably check in every 6 months

## 2020-10-28 ENCOUNTER — Ambulatory Visit: Payer: Medicare Other | Admitting: Family Medicine

## 2020-10-29 ENCOUNTER — Telehealth: Payer: Self-pay

## 2020-10-29 NOTE — Telephone Encounter (Signed)
Pt daughter called stating pt was prescribed lidocaine for her shingles. Pt daughter states they are out of lidocaine and tried to get it refilled but pharmacy would not refill it. Pt daughter asked if Dr. Yong Channel could send anything else in for pt. Please advise.

## 2020-10-30 MED ORDER — LIDOCAINE 5 % EX OINT: 1.0000 "application " | TOPICAL_OINTMENT | CUTANEOUS | 0 refills | Status: DC | PRN

## 2020-10-30 NOTE — Telephone Encounter (Signed)
Rx refilled.

## 2020-10-30 NOTE — Telephone Encounter (Signed)
May refill medicine

## 2020-10-30 NOTE — Telephone Encounter (Signed)
See below

## 2020-11-04 NOTE — Progress Notes (Signed)
NEUROLOGY FOLLOW UP OFFICE NOTE  [name] [id number]  Assessment/Plan:   1.  Major neurocognitive disorder - progressed.  Depression continues to be a significant factor. 2.  Tremor - question of Parkinson's disease.  Not observed today but description of her typical tremor does sound parkinsonian.  She does have cogwheel rigidity, so Parkinson's disease is possible.  However, I would defer starting carbidopa-levodopa as her symptoms are not significant and are primarily tremor which not always responds and I think side effects would outweigh benefits.   3.  Localization-related symptomatic seizure disorder 3.  History of cardioembolic stroke  1.  In order to limit significant change in medication, I will first increase sertraline to 50mg  daily.  I hope that improving her mood may lessen the severity of her delusions.  Family will contact me in 6 to 8 weeks with update.  If still with significant disruptive delusions and hallucinations, would then consider starting quetiapine but would need to first check EKG.  I did explain that these antipsychotic medications carry a black box warning.   2.  Keppra 1000mg  twice daily 3.  Will order a home health assessment to help with recommendations for any necessary accommodations. 4.  Follow up 6 months.  Subjective:  [name] is an 87 year old right-handed female with lumbar stenosis, and history of symptomatic seizure secondary to cardioembolic stroke who follows up for cognitive impairment and also tremor.  UPDATE: Current medications:  Keppra 1000mg  twice daily; [name], gabapentin 200mg  at bedtime, sertraline 25mg  daily left torax thoracic  Last seen July 2021.  Due to reported irritability, tried transitioning from [place] to Depakote.  However family then wanted her to remain on Keppra.  Started on citalopram but reportedly made her more irritable, so she was switched to escitalopram in September.  She was started on  sertraline 25mg  daily.  Stopped gabapentin as it was ineffective for pain and didn't want to take a lot of medications.  She got shingles 2 1/2 weeks ago, involving the left side of her torso.  She was on prednisone but stopped after 3 days due to increased confusion.  She was then given lidocaine cream but was overusing it, also causing confusion and lethargy.  UA negative for UTI but she was found to be dehydrated.  Her family had her stop lidocaine cream and have her increase water intake.  She has improved over the past couple of days.  She continues to have significant irritability and anger.  She has paranoia, believing that people are out to do something bad to her.  Sometimes she thinks that there is an imposter of her husband also in the house.  For the past couple of years, she has had a tremor in her hands which has been more prominent over the past several months. It occurs with action and at rest.  Labs 12/13/2019:  CBC with WBC 5.5, HGB 12.6, HCT 42.3, PLT 165; BMP with Na 141, K 5, Cl 103, CO2 31, glucose 106, BUN 15, Cr 1.06, Ca 9.9 10/16/2019:  Hepatic panel with t bili 0.5, ALP 81, AST 13, ALT 13.  HISTORY: She has had several episodes of confusion and aphasia in October and November of 2014, associated with stroke and presumed seizure. On 05/26/13, MRI of the brain was performed, which revealed an acute lacunar infarct in the left anterior corona radiata and lentiform nucleus. CTA of the head revealed atherosclerotic changes in the cavernous carotid arteries without significant stenosis. CTA  of neck revealed focal 50% stenosis of mid right ICA. She was switched from [name] to Eliquis. She was readmitted to the hospital on 05/31/13 with recurrent aphasia. She had trouble getting her words out and then couldn't speak at all. Repeat MRI of brain revealed new punctate infarct in the left posterior corona radiata, the same location as previous infarct. No changes in management were  made. She was readmitted to the hospital on 06/07/13 for altered mental status. EEG was performed, showing left fronto-parieto-temporal theta slowing. Due to possibility of seizure, she was started on Keppra 250mg  BID. No change made to Eliquis. She presented to the ED on 06/16/13 after waking up in the morning with expressive aphasia and right-sided weakness. Symptoms improved in the ED. CT head revealed prior lacunar infarct in left lentiform nucleus and corona radiata, but no new abnormalities. It was suspected that she may have had another seizure and her Keppra was increased to 500mg  BID. She was admitted to the hospital again on 06/21/13 again for expressive aphasia. MRI brain revealed 2 new punctate infarcts in the bilateral hemispheres at level of centrum semiovale. Eliquis was changed to Xarelto. Keppra was increased to 750mg  BID.   She presented to the ED on 08/05/14 for recurrent episode of aphasia, lasting 1.5 hours. This was more significant than the previous year. She described it as knowing what she wanted to say but having trouble getting the words out. She had trouble writing as well. MRI of brain revealed no acute infarct. She had an episode two days prior, lasting 1 hour. Carotid doppler revealed no hemodynamically significant ICA stenosis.   She was [facility] from 05/01/17 to 05/03/17 for altered mental status and generalized weakness with 2 seizures over the weekend. She was found by her daughter sitting in the dark moaning with foaming at the mouth and left sided shaking lasting a couple of minutes. She was altered for much longer time. CT of head from 04/30/17 and 05/01/17 revealed no acute changes. She was found to have a temperature of 100.3. She was found to have Klebsiella UTI/cystitis on UA and culture. She was loaded with Keppra and treated with Cipro. Keppra was increased to 1000mg  twice daily. About a week prior, she reportedly had  another brief episode of jibberish speech and right facial droop, lasting 2-3 minutes.  In 2019, she has had significant depression related to her chronic pain, loss of independence (such as no longer able to drive), and the death of her daughter. Back in June, we try to transition her from [place] to [name] in an attempt to alleviate some of her irritability. She began experiencing increased confusion, falls, irritability and paranoia. She was subsequently switched back from [name] to [place]. She was started on [name] to help with agitation.   PAST MEDICAL HISTORY: Past Medical History:  Diagnosis Date  . ABDOMINAL INCISIONAL HERNIA 04/03/2010      . Anxiety   . Arthritis    "lots; hands" (05/26/2013)  . Blood transfusion    "w/1st miscarriage" (05/26/2013)  . COPD (chronic obstructive pulmonary disease) ([place])   . Coronary artery disease   . Depression   . Diverticulitis of colon with hemorrhage 07/24/2008  . DVT (deep venous thrombosis) ([place]) 1954   "after I had my first child" (05/26/2013)  . Exertional shortness of breath   . Family history of anesthesia complication    "sisters also get sick as a dog" (05/26/2013)  . GERD 05/03/2007      .  GERD (gastroesophageal reflux disease)   . H/O hiatal hernia   . HCAP (healthcare-associated pneumonia) 06/26/2013  . Heart murmur    "when I was a child" (05/26/2013)  . History of TIAs   . Hyperlipidemia   . Hypertension   . Keratotic plaque   . 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 Lymphona, mantle cell, inguinal region/lower limb ([place])    "large c-cell" (05/26/2013)  . OSTEOARTHRITIS 05/03/2007  . PEPTIC ULCER DISEASE 07/06/2008  . Pneumonia 2013   "once" (05/26/2013)  . PUD (peptic ulcer disease)   . PVD (peripheral vascular disease) ([place])   . Seizures ([place])   . Stroke ([place]) 05/26/2013   "very mild; affected my speech for a while" (05/26/2013)  . VARICOSE VEINS LOWER  EXTREMITIES W/INFLAMMATION 01/15/2009    MEDICATIONS: Current Outpatient Medications on File Prior to Visit  Medication Sig Dispense Refill  . albuterol (VENTOLIN HFA) 108 (90 Base) MCG/ACT inhaler TAKE 2 PUFFS BY MOUTH EVERY 6 HOURS AS NEEDED FOR WHEEZE OR SHORTNESS OF BREATH 18 g 1  . atorvastatin (LIPITOR) 20 MG tablet TAKE 1 TABLET BY MOUTH EVERY DAY 90 tablet 1  . bisoprolol (ZEBETA) 5 MG tablet TAKE 1 TABLET BY MOUTH EVERY DAY 90 tablet 1  . busPIRone ([name]) 5 MG tablet TAKE 1 TABLET BY MOUTH TWICE A DAY (Patient taking differently: 5 mg daily.) 180 tablet 3  . cholecalciferol (VITAMIN D) 1000 UNITS tablet Take 1,000 Units by mouth daily.    . Cyanocobalamin (VITAMIN B-12 PO) Take 1 tablet by mouth daily.    . hydrocortisone (ANUSOL-HC) 2.5 % rectal cream Place 1 application rectally at bedtime. 30 g 0  . ketoconazole (NIZORAL) 2 % cream Apply 1 application topically daily. 15 g 0  . levETIRAcetam (KEPPRA) 1000 MG tablet Take 1 tablet (1,000 mg total) by mouth 2 (two) times daily. 60 tablet 4  . lidocaine (XYLOCAINE) 5 % ointment Apply 1 application topically as needed for moderate pain (related to shingles). 240 g 0  . loperamide (IMODIUM) 2 MG capsule Take 2 mg by mouth daily as needed for diarrhea or loose stools.     . NONFORMULARY OR COMPOUNDED ITEM Antifungal solution: Terbinafine 3%, Fluconazole 2%, Tea Tree Oil 5%, Urea 10%, Ibuprofen 2% in DMSO suspension #100mL 1 each 3  . omeprazole (PRILOSEC) 20 MG capsule Take 20 mg by mouth daily as needed.     . sertraline (ZOLOFT) 25 MG tablet TAKE 1 TABLET BY MOUTH EVERY DAY 90 tablet 0  . triamcinolone cream (KENALOG) 0.1 % Apply 1 application topically 2 (two) times daily. For 7-10 days maximum 80 g 0  . XARELTO 15 MG TABS tablet TAKE 1 TABLET BY MOUTH EVERY DAY WITH SUPPER 30 tablet 10   No current facility-administered medications on file prior to visit.    ALLERGIES: Allergies  Allergen Reactions  . Ativan [Lorazepam] Other  (See Comments)    Severe hallucinations   . Gabapentin     encephalopathy  . Propoxyphene Hcl Nausea And Vomiting  . Codeine Itching  . Penicillins Rash    Has patient had a PCN reaction causing immediate rash, facial/tongue/throat swelling, SOB or lightheadedness with hypotension: Yes Has patient had a PCN reaction causing severe rash involving mucus membranes or skin necrosis: Unk Has patient had a PCN reaction that required hospitalization: No Has patient had a PCN reaction occurring within the last 10 years: Yes If all of the above answers  are "NO", then may proceed with Cephalosporin use.   . Vancomycin Rash    FAMILY HISTORY: Family History  Problem Relation Age of Onset  . Colon cancer Sister        colon  . Hyperlipidemia Mother   . Stroke Mother   . Cancer Father        mesothelioma      Objective:  Blood pressure 124/72, pulse 73. General: No acute distress.  Patient appears well-groomed.   Head:  Normocephalic/atraumatic Eyes:  Fundi examined but not visualized Neck: supple, no paraspinal tenderness, full range of motion Heart:  Regular rate and rhythm Lungs:  Clear to auscultation bilaterally Back: No paraspinal tenderness Neurological Exam:  [employer] Mental Exam 02/06/2020 11/05/2020  Weekday Correct 1 0  Current year 1 0  What state are we in? 1 0  Amount spent 0 1  Amount left 0 0  # of Animals 0 0  5 objects recall 3 0  Number series 0 0  Hour markers 0 0  Time correct 2 0  Placed X in triangle correctly 0 0  Largest Figure 1 1  Name of female 0 0  Date back to work 0 0  Type of work 0 0  State she lived in 2 0  Total score 11 2   CN II-XII intact. Bulk and tone normal, muscle strength 5/5 throughout. Cogwheel rigidity in wrists, right greater than left.  Fine tremor in hands postural and at rest.   Sensation to light touch  intact.  Deep tendon reflexes 2+ throughout, toes downgoing.  Finger to nose  intact.  Shuffling gait with  walker.     Metta Clines, DO  CC: Brayton Mars. Yong Channel, MD

## 2020-11-05 ENCOUNTER — Encounter: Payer: Self-pay | Admitting: Neurology

## 2020-11-05 ENCOUNTER — Ambulatory Visit (INDEPENDENT_AMBULATORY_CARE_PROVIDER_SITE_OTHER): Payer: Medicare Other | Admitting: Neurology

## 2020-11-05 ENCOUNTER — Other Ambulatory Visit: Payer: Self-pay

## 2020-11-05 VITALS — BP 124/72 | HR 73

## 2020-11-05 DIAGNOSIS — G40109 Localization-related (focal) (partial) symptomatic epilepsy and epileptic syndromes with simple partial seizures, not intractable, without status epilepticus: Secondary | ICD-10-CM | POA: Diagnosis not present

## 2020-11-05 DIAGNOSIS — G20 Parkinson's disease: Secondary | ICD-10-CM | POA: Diagnosis not present

## 2020-11-05 DIAGNOSIS — F3342 Major depressive disorder, recurrent, in full remission: Secondary | ICD-10-CM | POA: Diagnosis not present

## 2020-11-05 DIAGNOSIS — F0390 Unspecified dementia without behavioral disturbance: Secondary | ICD-10-CM

## 2020-11-05 MED ORDER — SERTRALINE HCL 50 MG PO TABS: 50.0000 mg | ORAL_TABLET | Freq: Every day | ORAL | 5 refills | Status: DC

## 2020-11-05 NOTE — Patient Instructions (Addendum)
Increase sertraline to 50mg  daily.  If no improvement in mood or delusions/hallucinations in 6 to 8 weeks contact me Will order Home Health evaluation

## 2020-11-06 ENCOUNTER — Other Ambulatory Visit: Payer: Self-pay | Admitting: Family Medicine

## 2020-11-08 ENCOUNTER — Other Ambulatory Visit: Payer: Self-pay | Admitting: Family Medicine

## 2020-11-28 ENCOUNTER — Ambulatory Visit: Payer: Medicare Other | Admitting: Neurology

## 2020-11-29 ENCOUNTER — Other Ambulatory Visit: Payer: Self-pay | Admitting: Neurology

## 2020-12-09 ENCOUNTER — Other Ambulatory Visit: Payer: Self-pay | Admitting: Family Medicine

## 2021-01-17 ENCOUNTER — Other Ambulatory Visit: Payer: Self-pay

## 2021-01-20 ENCOUNTER — Ambulatory Visit: Payer: Medicare Other | Admitting: Podiatry

## 2021-01-20 ENCOUNTER — Ambulatory Visit: Payer: Medicare Other | Admitting: Family Medicine

## 2021-01-21 ENCOUNTER — Other Ambulatory Visit: Payer: Self-pay

## 2021-01-21 ENCOUNTER — Encounter: Payer: Self-pay | Admitting: Family Medicine

## 2021-01-21 ENCOUNTER — Ambulatory Visit: Payer: Medicare Other | Admitting: Family Medicine

## 2021-01-21 VITALS — BP 136/72 | HR 72 | Temp 98.5°F | Ht 64.0 in | Wt 178.0 lb

## 2021-01-21 DIAGNOSIS — J449 Chronic obstructive pulmonary disease, unspecified: Secondary | ICD-10-CM

## 2021-01-21 DIAGNOSIS — I10 Essential (primary) hypertension: Secondary | ICD-10-CM

## 2021-01-21 DIAGNOSIS — R413 Other amnesia: Secondary | ICD-10-CM | POA: Diagnosis not present

## 2021-01-21 DIAGNOSIS — F3342 Major depressive disorder, recurrent, in full remission: Secondary | ICD-10-CM

## 2021-01-21 DIAGNOSIS — R197 Diarrhea, unspecified: Secondary | ICD-10-CM

## 2021-01-21 DIAGNOSIS — I480 Paroxysmal atrial fibrillation: Secondary | ICD-10-CM | POA: Diagnosis not present

## 2021-01-21 LAB — CBC WITH DIFFERENTIAL/PLATELET
Basophils Absolute: 0 10*3/uL (ref 0.0–0.1)
Basophils Relative: 0.8 % (ref 0.0–3.0)
Eosinophils Absolute: 0.1 10*3/uL (ref 0.0–0.7)
Eosinophils Relative: 1.7 % (ref 0.0–5.0)
HCT: 36 % (ref 36.0–46.0)
Hemoglobin: 12.1 g/dL (ref 12.0–15.0)
Lymphocytes Relative: 24.5 % (ref 12.0–46.0)
Lymphs Abs: 1.5 10*3/uL (ref 0.7–4.0)
MCHC: 33.5 g/dL (ref 30.0–36.0)
MCV: 88.1 fl (ref 78.0–100.0)
Monocytes Absolute: 0.5 10*3/uL (ref 0.1–1.0)
Monocytes Relative: 9.1 % (ref 3.0–12.0)
Neutro Abs: 3.8 10*3/uL (ref 1.4–7.7)
Neutrophils Relative %: 63.9 % (ref 43.0–77.0)
Platelets: 195 10*3/uL (ref 150.0–400.0)
RBC: 4.09 Mil/uL (ref 3.87–5.11)
RDW: 14.4 % (ref 11.5–15.5)
WBC: 5.9 10*3/uL (ref 4.0–10.5)

## 2021-01-21 LAB — COMPREHENSIVE METABOLIC PANEL
ALT: 13 U/L (ref 0–35)
AST: 15 U/L (ref 0–37)
Albumin: 4.1 g/dL (ref 3.5–5.2)
Alkaline Phosphatase: 58 U/L (ref 39–117)
BUN: 17 mg/dL (ref 6–23)
CO2: 27 mEq/L (ref 19–32)
Calcium: 9.7 mg/dL (ref 8.4–10.5)
Chloride: 104 mEq/L (ref 96–112)
Creatinine, Ser: 1.25 mg/dL — ABNORMAL HIGH (ref 0.40–1.20)
GFR: 37.85 mL/min — ABNORMAL LOW (ref >60.00)
Glucose, Bld: 99 mg/dL (ref 70–99)
Potassium: 4.4 mEq/L (ref 3.5–5.1)
Sodium: 139 mEq/L (ref 135–145)
Total Bilirubin: 0.6 mg/dL (ref 0.2–1.2)
Total Protein: 7 g/dL (ref 6.0–8.3)

## 2021-01-21 LAB — TSH: TSH: 1.38 u[IU]/mL (ref 0.35–4.50)

## 2021-01-21 NOTE — Patient Instructions (Addendum)
Health Maintenance Due  Topic Date Due   Zoster Vaccines- Shingrix (1 of 2)  Please check with your pharmacy to see if they have the shingrix vaccine. If they do- please get this immunization and update [place] by phone call or mychart with dates you receive the vaccine  Never done    I recommended Benefiber 2 tablespoons in 8 ounces of liquid daily for the next several weeks to help bulk the stools - see how she does off of imodiumh and laxatives such as miralax unless she has significant worsening of symptoms  Try to stay well hydrated  Team please try irrigation in both ears- if not effective  Can try debrox over the counter OR  Mineral oil for ear full of wax Purchase mineral oil from laxative aisle Lay down on your side with ear that is bothering you facing up Use 3-4 drops with a dropper and place in ear for 30 seconds Place cotton swab outside of ear Turn to other side and allow this to drain Repeat 3-4 x a day Return to see [place] if not improving within a few days  Please stop by lab before you go If you have mychart- we will send your results within 3 business days of [place] receiving them.  If you do not have mychart- we will call you about results within 5 business days of [place] receiving them.  *please also note that you will see labs on mychart as soon as they post. I will later go in and write notes on them- will say "notes from Dr. Yong Channel"    Recommended follow up: Return in about 2 months (around 03/23/2021) for follow up- or sooner if needed.

## 2021-01-21 NOTE — Progress Notes (Signed)
Phone [phone] In person visit   Subjective:   [name] is a 87 y.o. year old very pleasant female patient who presents for/with See problem oriented charting Chief Complaint  Patient presents with   Constipation   Diarrhea   This visit occurred during the SARS-CoV-2 public health emergency.  Safety protocols were in place, including screening questions prior to the visit, additional usage of staff PPE, and extensive cleaning of exam room while observing appropriate contact time as indicated for disinfecting solutions.   Past Medical History-  Patient Active Problem List   Diagnosis Date Noted   Memory loss 12/14/2019    Priority: High   Diastolic CHF ([place]) 00/76/2263    Priority: High   History of cardioembolic stroke 33/54/5625    Priority: High   Spinal stenosis, lumbar region, with neurogenic claudication 05/09/2014    Priority: High   History of Focal seizure ([place]) 06/16/2013    Priority: High   History of TIA (transient ischemic attack) 05/31/2013    Priority: High   History of Large B-cell lymphoma  02/18/2012    Priority: High   Atrial fibrillation ([place]) 12/14/2008    Priority: High   Mild aortic stenosis 08/26/2020    Priority: Medium   Recurrent major depressive disorder, in full remission ([place]) 10/16/2019    Priority: Medium   Agitation 03/11/2018    Priority: Medium   History of chronic respiratory failure- prior on o2 06/15/2017    Priority: Medium   Bacteremia due to Streptococcus 11/21/2015    Priority: Medium   Cellulitis of leg, right 11/20/2015    Priority: Medium   Diverticulitis of colon 09/26/2014    Priority: Medium   CKD (chronic kidney disease), stage III ([place]) 09/26/2014    Priority: Medium   Laryngeal carcinoma ([place])     Priority: Medium   Anxiety state 10/20/2008    Priority: Medium   Depression 10/20/2008    Priority: Medium   Dyspnea on exertion 07/06/2008    Priority: Medium   COPD GOLD II 03/26/2008    Priority:  Medium   GERD (gastroesophageal reflux disease) 05/03/2007    Priority: Medium   Hyperlipidemia 03/18/2007    Priority: Medium   Essential hypertension 03/18/2007    Priority: Medium   Loose stools 06/01/2019    Priority: Low   Hemorrhoids 06/01/2019    Priority: Low   Chronic diarrhea 09/04/2017    Priority: Low   Aortic atherosclerosis ([place]) 06/30/2016    Priority: Low   Weakness generalized 06/30/2013    Priority: Low   Bilateral hand pain 01/09/2013    Priority: Low    Medications- reviewed and updated Current Outpatient Medications  Medication Sig Dispense Refill   albuterol (VENTOLIN HFA) 108 (90 Base) MCG/ACT inhaler TAKE 2 PUFFS BY MOUTH EVERY 6 HOURS AS NEEDED FOR WHEEZE OR SHORTNESS OF BREATH 18 g 1   atorvastatin (LIPITOR) 20 MG tablet TAKE 1 TABLET BY MOUTH EVERY DAY 90 tablet 1   bisoprolol (ZEBETA) 5 MG tablet TAKE 1 TABLET BY MOUTH EVERY DAY 90 tablet 1   busPIRone ([name]) 5 MG tablet TAKE 1 TABLET BY MOUTH TWICE A DAY 180 tablet 3   cholecalciferol (VITAMIN D) 1000 UNITS tablet Take 1,000 Units by mouth daily.     Cyanocobalamin (VITAMIN B-12 PO) Take 1 tablet by mouth daily.     gabapentin (NEURONTIN) 100 MG capsule Take by mouth.     hydrocortisone (ANUSOL-HC) 2.5 % rectal cream Place 1 application rectally at  bedtime. 30 g 0   ketoconazole (NIZORAL) 2 % cream Apply 1 application topically daily. 15 g 0   levETIRAcetam (KEPPRA) 1000 MG tablet Take 1 tablet (1,000 mg total) by mouth 2 (two) times daily. 60 tablet 4   lidocaine (XYLOCAINE) 5 % ointment Apply 1 application topically as needed for moderate pain (related to shingles). 240 g 0   loperamide (IMODIUM) 2 MG capsule Take 2 mg by mouth daily as needed for diarrhea or loose stools.      NONFORMULARY OR COMPOUNDED ITEM Antifungal solution: Terbinafine 3%, Fluconazole 2%, Tea Tree Oil 5%, Urea 10%, Ibuprofen 2% in DMSO suspension #24mL 1 each 3   omeprazole (PRILOSEC) 20 MG capsule Take 20 mg by mouth daily  as needed.      sertraline (ZOLOFT) 50 MG tablet TAKE 1 TABLET BY MOUTH EVERY DAY 90 tablet 1   triamcinolone cream (KENALOG) 0.1 % Apply 1 application topically 2 (two) times daily. For 7-10 days maximum 80 g 0   XARELTO 15 MG TABS tablet TAKE 1 TABLET BY MOUTH EVERY DAY WITH SUPPER 30 tablet 10   No current facility-administered medications for this visit.     Objective:  BP 136/72   Pulse 72   Temp 98.5 F (36.9 C) (Temporal)   Ht 5\' 4"  (1.626 m)   Wt 178 lb (80.7 kg)   LMP  (LMP Unknown)   SpO2 90%   BMI 30.55 kg/m  Gen: NAD, resting comfortably Bilateral cerumen impactions HEENT: TMs and canals occlusion bilaterally CV: RRR no rubs or gallops, stable murmur Lungs: CTAB no crackles, wheeze, rhonchi, prolonged expiratory phase Abdomen: soft/nontender/nondistended/normal bowel sounds. No rebound or guarding.  Ext: trace edema Skin: warm, dry     Assessment and Plan  #Social Updates- She will be 87 years old this upcoming August.  # Diarrhea and Constipation S:No control over bowels- could be due to self medication per family-gets in cycles of taking Imodium for loose stools and then laxatives when constipated-tends toward looser stools and incontinence. She gets angry and depressed particularly when she has loose stools and incontinence and this will eventually lead to confusion- mean dialogue and very agitated. Confirms hemorrhoid presence intermittently.   Saw GI in 2020-they do not remember instructions from a prior GI visit and the daughter is not aware if they were even ever following the instructions. A/P: We reviewed GI note from 2020 together and had almost identical symptoms at that time-I reinforced prior plan.  From AVS:  "  I recommended Benefiber 2 tablespoons in 8 ounces of liquid daily for the next several weeks to help bulk the stools - see how she does off of imodiumh and laxatives such as miralax unless she has significant worsening of symptoms "  #  Tremors S: frequent tremors. Neurologist 11/05/20 stated is could be a possibility of Parkinson's disease.  Patient opted out of medication at that time-unclear if medications would be beneficial and symptoms were rather mild A/P: Patient with possible Parkinson's per neurology-discussed possible medications but they would like to continue to hold off after discussion today and follow-up with Dr. Tomi Likens   #Difficulty Hearing S: She feels as if there is a blockage in her ears- she has a hard time hearing.  A/P: Discussed having ears irrigated bilaterally or trying Debrox or mineral oil-patient was unable to get on the table and staff was unable to get good viewing angle as patient put her ear down to her shoulder to avoid irrigation-family opted  to try home treatments  #COPD S: she spits up phlegm daily and her cough is intermittent. She was suppose to take Mucinex but is not taking it-family admits this would be challenging for her as she had baseline struggles to stay well-hydrated. Mucinex makes her even more dehydrated than she already is.  A/P: Patient not currently on any treatments other than as needed albuterol-continue current medication-discussed increasing water intake and trying Mucinex but she declines  #Mild dehydration S: BUN significantly elevated last visit with less significant elevation of creatinine A/P: We are going to recheck CMP today to reevaluate this-prior greater than 20:1 ratio BUN and creatinine  #Mild aortic stenosis-noted on echo 2017-likely cause for her murmur-could discuss potential updated echocardiogram at next visit  # Atrial fibrillation S: Rate controlled with bisoprolol 5 mg Anticoagulated with Xarelto 15 mg A/P: Appropriately anticoagulated and rate controlled-continue current medication  # Depression/agitation S: Medication:Zoloft 50 mg, buspirone 5 mg twice daily-he typically just takes once daily  A/P: Mild poor control when dealing with diarrhea  issues recently/loose stools-husband has to clean this up and this is difficult for him at age 81-we opted to try to address thyroid issues as above  Recommended follow up: Return in about 2 months (around 03/23/2021) for follow up- or sooner if needed. Future Appointments  Date Time Provider [place]  01/27/2021  8:45 AM Gardiner Barefoot, DPM [facility]    Lab/Order associations:   ICD-10-CM   1. Essential hypertension  I10 CBC with Differential/Platelet    Comprehensive metabolic panel    TSH    2. Diarrhea, unspecified type  R19.7 TSH    3. Memory loss  R41.3     4. Paroxysmal atrial fibrillation (HCC)  I48.0     5. Recurrent major depressive disorder, in full remission ([place])  F33.42     6. COPD GOLD II  J44.9      I,Harris Phan,acting as a scribe for [name], MD.,have documented all relevant documentation on the behalf of [name], MD,as directed by  [name], MD while in the presence of [name], MD.   I, [name], MD, have reviewed all documentation for this visit. The documentation on 01/21/21 for the exam, diagnosis, procedures, and orders are all accurate and complete.   Return precautions advised.  [name], MD

## 2021-01-27 ENCOUNTER — Ambulatory Visit: Payer: Medicare Other | Admitting: Podiatry

## 2021-01-28 ENCOUNTER — Ambulatory Visit: Payer: Medicare Other | Admitting: Family

## 2021-02-02 ENCOUNTER — Other Ambulatory Visit: Payer: Self-pay | Admitting: Family Medicine

## 2021-02-12 ENCOUNTER — Other Ambulatory Visit: Payer: Self-pay | Admitting: Family Medicine

## 2021-02-18 ENCOUNTER — Telehealth: Payer: Self-pay

## 2021-02-18 NOTE — Telephone Encounter (Signed)
Patient's daughter called in stating that patient is wanting to restart gabapentin. Also states that patient is experiencing cognitive decline. I have her scheduled for Monday at 1 pm

## 2021-02-20 NOTE — Telephone Encounter (Signed)
Spoke to [name] told her Dr. Yong Channel wants her to schedule an appt with Neurology also. [name] verbalized understanding. Told [name] you will see Dr. Yong Channel on Monday 02/24/21 at 1:00 PM. [name] verbalized understanding.

## 2021-02-20 NOTE — Telephone Encounter (Signed)
In regards to cognitive decline could also schedule follow up with neurology.  It says scheduled Monday at 1 pm but looks like visit is 03/24/21- just want to make sure they are aware of the timing for visit

## 2021-02-20 NOTE — Telephone Encounter (Signed)
Spoke to Alba, told her Dr. Yong Channel wants pt to schedule an appt with Neurology for follow up and has an appt on Monday 02/24/21 at 1:00 PM with Dr. Yong Channel. Kristen verbalized understanding and asked about the Gabapentin. Told her she will have to discuss it at appt in Monday. Dr. Yong Channel has been out all week. Kristen verbalized understanding.

## 2021-02-20 NOTE — Telephone Encounter (Signed)
Left message on [phone], Kristen's voicemail to call office.

## 2021-02-24 ENCOUNTER — Ambulatory Visit: Payer: Medicare Other | Admitting: Family Medicine

## 2021-02-24 ENCOUNTER — Other Ambulatory Visit: Payer: Self-pay

## 2021-02-24 ENCOUNTER — Encounter: Payer: Self-pay | Admitting: Family Medicine

## 2021-02-24 VITALS — BP 110/58 | HR 75 | Temp 97.9°F | Resp 16 | Ht 64.0 in | Wt 176.4 lb

## 2021-02-24 DIAGNOSIS — F0390 Unspecified dementia without behavioral disturbance: Secondary | ICD-10-CM | POA: Diagnosis not present

## 2021-02-24 DIAGNOSIS — I480 Paroxysmal atrial fibrillation: Secondary | ICD-10-CM | POA: Diagnosis not present

## 2021-02-24 DIAGNOSIS — F3341 Major depressive disorder, recurrent, in partial remission: Secondary | ICD-10-CM | POA: Diagnosis not present

## 2021-02-24 MED ORDER — TRIAMCINOLONE ACETONIDE 0.1 % EX CREA: 1.0000 "application " | TOPICAL_CREAM | Freq: Two times a day (BID) | CUTANEOUS | 0 refills | Status: DC

## 2021-02-24 NOTE — Patient Instructions (Addendum)
EKG today room 8 for low table  I will message Dr. Tomi Likens about seroquel if interval is ok  No changes  for now  Recommended follow up: 4 month follow up or sooner if needed

## 2021-02-24 NOTE — Progress Notes (Signed)
Phone [phone] In person visit   Subjective:   [name] is a 87 y.o. year old very pleasant female patient who presents for/with See problem oriented charting Chief Complaint  Patient presents with   Follow-up    Medication management    This visit occurred during the SARS-CoV-2 public health emergency.  Safety protocols were in place, including screening questions prior to the visit, additional usage of staff PPE, and extensive cleaning of exam room while observing appropriate contact time as indicated for disinfecting solutions.   Past Medical History-  Patient Active Problem List   Diagnosis Date Noted   Major neurocognitive disorder ([place]) 02/24/2021    Priority: High   Memory loss 12/14/2019    Priority: High   Diastolic CHF ([place]) [id number]    Priority: High   History of cardioembolic stroke [id number]    Priority: High   Spinal stenosis, lumbar region, with neurogenic claudication 05/09/2014    Priority: High   History of Focal seizure ([place]) 06/16/2013    Priority: High   History of TIA (transient ischemic attack) 05/31/2013    Priority: High   History of Large B-cell lymphoma  02/18/2012    Priority: High   Atrial fibrillation ([place]) 12/14/2008    Priority: High   Mild aortic stenosis 08/26/2020    Priority: Medium   Recurrent major depressive disorder, in full remission ([place]) 10/16/2019    Priority: Medium   Agitation 03/11/2018    Priority: Medium   History of chronic respiratory failure- prior on o2 06/15/2017    Priority: Medium   Bacteremia due to Streptococcus 11/21/2015    Priority: Medium   Cellulitis of leg, right 11/20/2015    Priority: Medium   Diverticulitis of colon 09/26/2014    Priority: Medium   CKD (chronic kidney disease), stage III ([place]) 09/26/2014    Priority: Medium   Laryngeal carcinoma ([place])     Priority: Medium   Anxiety state 10/20/2008    Priority: Medium   Depression 10/20/2008    Priority: Medium   Dyspnea on  exertion 07/06/2008    Priority: Medium   COPD GOLD II 03/26/2008    Priority: Medium   GERD (gastroesophageal reflux disease) 05/03/2007    Priority: Medium   Hyperlipidemia 03/18/2007    Priority: Medium   Essential hypertension 03/18/2007    Priority: Medium   Loose stools 06/01/2019    Priority: Low   Hemorrhoids 06/01/2019    Priority: Low   Chronic diarrhea 09/04/2017    Priority: Low   Aortic atherosclerosis ([place]) 06/30/2016    Priority: Low   Weakness generalized 06/30/2013    Priority: Low   Bilateral hand pain 01/09/2013    Priority: Low    Medications- reviewed and updated Current Outpatient Medications  Medication Sig Dispense Refill   atorvastatin (LIPITOR) 20 MG tablet TAKE 1 TABLET BY MOUTH EVERY DAY 90 tablet 1   bisoprolol (ZEBETA) 5 MG tablet TAKE 1 TABLET BY MOUTH EVERY DAY 90 tablet 1   busPIRone ([name]) 5 MG tablet TAKE 1 TABLET BY MOUTH TWICE A DAY 180 tablet 3   cholecalciferol (VITAMIN D) 1000 UNITS tablet Take 1,000 Units by mouth daily.     Cyanocobalamin (VITAMIN B-12 PO) Take 1 tablet by mouth daily.     levETIRAcetam (KEPPRA) 1000 MG tablet TAKE 1 TABLET BY MOUTH TWICE A DAY 180 tablet 1   loperamide (IMODIUM) 2 MG capsule Take 2 mg by mouth daily as needed for diarrhea or loose stools.  omeprazole (PRILOSEC) 20 MG capsule Take 20 mg by mouth daily as needed.      sertraline (ZOLOFT) 50 MG tablet TAKE 1 TABLET BY MOUTH EVERY DAY 90 tablet 1   XARELTO 15 MG TABS tablet TAKE 1 TABLET BY MOUTH EVERY DAY WITH SUPPER 30 tablet 10   albuterol (VENTOLIN HFA) 108 (90 Base) MCG/ACT inhaler TAKE 2 PUFFS BY MOUTH EVERY 6 HOURS AS NEEDED FOR WHEEZE OR SHORTNESS OF BREATH (Patient not taking: Reported on 02/24/2021) 18 g 1   hydrocortisone (ANUSOL-HC) 2.5 % rectal cream Place 1 application rectally at bedtime. (Patient not taking: Reported on 02/24/2021) 30 g 0   ketoconazole (NIZORAL) 2 % cream Apply 1 application topically daily. (Patient not taking:  Reported on 02/24/2021) 15 g 0   lidocaine (XYLOCAINE) 5 % ointment Apply 1 application topically as needed for moderate pain (related to shingles). (Patient not taking: Reported on 02/24/2021) 240 g 0   NONFORMULARY OR COMPOUNDED ITEM Antifungal solution: Terbinafine 3%, Fluconazole 2%, Tea Tree Oil 5%, Urea 10%, Ibuprofen 2% in DMSO suspension #27m (Patient not taking: Reported on 02/24/2021) 1 each 3   triamcinolone cream (KENALOG) 0.1 % Apply 1 application topically 2 (two) times daily. For 7-10 days maximum 80 g 0   No current facility-administered medications for this visit.     Objective:  BP (!) 110/58 (BP Location: Left Arm, Patient Position: Sitting, Cuff Size: Normal)   Pulse 75   Temp 97.9 F (36.6 C) (Temporal)   Resp 16   Ht '5\' 4"'$  (1.626 m)   Wt 176 lb 6.4 oz (80 kg)   LMP  (LMP Unknown)   SpO2 92%   BMI 30.28 kg/m  Gen: NAD, resting comfortably CV: RRR no rubs or gallops Lungs: CTAB no crackles, wheeze, rhonchi Abdomen: soft/nontender/nondistended/normal bowel sounds. No rebound or guarding.  Ext: no edema Skin: warm, dry Neuro: hard of hearing  EKG: sinus rhythm with rate 70, normal axis, normal intervals other than borderline pr right at 200 msec, no hypertrophy, q waves in V1 and v2 stable from prior- otherwise no st or t wave changes     Assessment and Plan   #Major neurocognitive disorder/agitation/depression S: Patient follows with Dr. JSheryle HailApril 2022.  Patient also with a tremor and some question of Parkinson's disease.  History of stroke so vascular dementia could play a role as well.  JDellis Filberthas been concerned depression could be contributing to her symptoms as well-sertraline was increased to 50 mg.  Patient has had some delusions-there was a plan to consider Seroquel but would need updated EKG  Plan after last visit was a 6 to 8-week check in with Dr. JTomi Likensbut they have not reached back out at this point.  Patient with encephalopathy on  gabapentin so we opted to avoid this-stumbled over words for about 15 minutes after last dosage when trialed in February. They ask about restarting but after discussion we opted out of this.   Family reports even with increased sertraline to 50 mg no improvement in agitation- open to considering seroquel [facility] 01/21/2021 10/22/2020 08/26/2020  Decreased Interest 0 0 3  Down, Depressed, Hopeless 3 0 3  PHQ - 2 Score 3 0 6  Altered sleeping 0 3 3  Tired, decreased energy '2 3 3  '$ Change in appetite 3 2 0  Feeling bad or failure about yourself  0 0 3  Trouble concentrating 0 0 3  Moving slowly or fidgety/restless 0 0 0  Suicidal thoughts 0 0 0  PHQ-9 Score '8 8 18  '$ Difficult doing work/chores Extremely dIfficult Somewhat difficult Somewhat difficult  Some recent data might be hidden  A/P: major neurocognitive disorder (family reports stable) also with depression/anxiety/agitation (family reports worse over time) Plan per neurology is to consider seroquel but needs to have non prolonted qt interval- will update today (not noted on EKG). Depression appears poorly controlled/partial remission only- continue sertraline 50 mg for now and continue the above buspirone as well for now.  We will reach out to Dr. Tomi Likens about potentially starting Seroquel prior to November visit -If patient is started on Seroquel I wonder if we could stop her buspirone or at least reduce it -extended counseling to family about patients agitation/decline/delusions and paranoia (she thinks husband is not her husband or cheating at times). Discussed black box warning of seroquel and increased death risk but for quality of life for patient they believe benefits would outweight risks  # Atrial fibrillation S: Rate controlled with bisoprolol Anticoagulated with xarelto 15 mg daily adjusted for renal function A/P: Actually appears to be in sinus rhythm today-heart rate is controlled with bisoprolol and patient is  appropriately anticoagulated-continue current medication  #intermittent rashes- respond well to triamcinolone- patient /family request refill  Recommended follow up: 4 month suggested Future Appointments  Date Time Provider [place]  03/03/2021  3:15 PM Gardiner Barefoot, DPM [facility]  06/09/2021  1:30 PM Pieter Partridge, DO [facility] None    Lab/Order associations:   ICD-10-CM   1. Major neurocognitive disorder ([place])  F03.90     2. Paroxysmal atrial fibrillation ([facility])  I48.0 EKG 12-Lead    3. Recurrent major depressive disorder, in partial remission ([facility])  F33.41      Time Spent: 45 minutes of total time (1:00 PM- 1:45 PM with an additional 2 minutes spent on ekg interpretation outside of this) was spent on the date of the encounter performing the following actions: chart review prior to seeing the patient, obtaining history, performing a medically necessary exam, counseling on the treatment plan and comforting family over concerns, placing orders, and documenting in our EHR.    Meds ordered this encounter  Medications   triamcinolone cream (KENALOG) 0.1 %    Sig: Apply 1 application topically 2 (two) times daily. For 7-10 days maximum    Dispense:  80 g    Refill:  0    Return precautions advised.  [name], MD

## 2021-02-25 NOTE — Telephone Encounter (Signed)
Received message from patient's PCP.  Family is ready to start Seroquel (they are aware of black box warning).  EKG was performed with no prolonged QTc.  Please send prescription for Seroquel '25mg'$  at night.  Thank you

## 2021-02-27 MED ORDER — QUETIAPINE FUMARATE 25 MG PO TABS: 25.0000 mg | ORAL_TABLET | Freq: Every day | ORAL | 1 refills | Status: DC

## 2021-02-27 NOTE — Telephone Encounter (Signed)
Seroquel '25mg'$  at night sent to the pharmacy per [facility].

## 2021-02-27 NOTE — Addendum Note (Signed)
Addended by: Venetia Night on: 02/27/2021 02:17 PM   Modules accepted: Orders

## 2021-03-03 ENCOUNTER — Encounter: Payer: Self-pay | Admitting: Podiatry

## 2021-03-03 ENCOUNTER — Other Ambulatory Visit: Payer: Self-pay

## 2021-03-03 ENCOUNTER — Ambulatory Visit (INDEPENDENT_AMBULATORY_CARE_PROVIDER_SITE_OTHER): Payer: Medicare Other | Admitting: Podiatry

## 2021-03-03 DIAGNOSIS — M2041 Other hammer toe(s) (acquired), right foot: Secondary | ICD-10-CM

## 2021-03-03 DIAGNOSIS — B351 Tinea unguium: Secondary | ICD-10-CM

## 2021-03-03 DIAGNOSIS — M2042 Other hammer toe(s) (acquired), left foot: Secondary | ICD-10-CM

## 2021-03-03 DIAGNOSIS — M79675 Pain in left toe(s): Secondary | ICD-10-CM | POA: Diagnosis not present

## 2021-03-03 DIAGNOSIS — N183 Chronic kidney disease, stage 3 unspecified: Secondary | ICD-10-CM

## 2021-03-03 DIAGNOSIS — M79674 Pain in right toe(s): Secondary | ICD-10-CM | POA: Diagnosis not present

## 2021-03-03 NOTE — Progress Notes (Signed)
This patient returns to my office for at risk foot care.  This patient requires this care by a professional since this patient will be at risk due to having chronic kidney disease and coagulation defect .  Patient is taking xarelto.  This patient is unable to cut nails herself since the patient cannot reach her nails.These nails are painful walking and wearing shoes.  This patient presents for at risk foot care today.  General Appearance  Alert, conversant and in no acute stress.  Vascular  Dorsalis pedis and posterior tibial  pulses are palpable  bilaterally.  Capillary return is within normal limits  bilaterally. Temperature is within normal limits  bilaterally.  Neurologic  Senn-Weinstein monofilament wire test within normal limits  bilaterally. Muscle power within normal limits bilaterally.  Nails Thick disfigured discolored nails with subungual debris  from hallux to fifth toes bilaterally. No evidence of bacterial infection or drainage bilaterally.  Orthopedic  No limitations of motion  feet .  No crepitus or effusions noted.  No bony pathology .  Hammer toes  B/l.  Skin  normotropic skin with no porokeratosis noted bilaterally.  No signs of infections or ulcers noted.     Onychomycosis  Pain in right toes  Pain in left toes  Consent was obtained for treatment procedures.   Mechanical debridement of nails 1-5  bilaterally performed with a nail nipper.  Filed with dremel without incident.    Return office visit    4 months                  Told patient to return for periodic foot care and evaluation due to potential at risk complications.   Gardiner Barefoot DPM

## 2021-03-05 ENCOUNTER — Other Ambulatory Visit: Payer: Self-pay | Admitting: Family Medicine

## 2021-03-12 ENCOUNTER — Telehealth: Payer: Self-pay | Admitting: Neurology

## 2021-03-12 NOTE — Telephone Encounter (Signed)
Patient spouse states that patient was to have a EKG done and that it was done at Dr Ronney Lion office. He states that they were waiting on a call from [facility] about a medication ( he does not know the name ) change after he saw the EKG results. He states patient is getting worse not better. He would like to speak to someone

## 2021-03-14 MED ORDER — QUETIAPINE FUMARATE 25 MG PO TABS: ORAL_TABLET | ORAL | 1 refills | Status: DC

## 2021-03-14 NOTE — Telephone Encounter (Signed)
If no side effects on 1/2 tab qhs after 1 week, can increase to 1 tab qhs. Dr. Tomi Likens had sent in Rx on 02/27/21. Thanks

## 2021-03-14 NOTE — Telephone Encounter (Signed)
New script sent pt husband advised.

## 2021-03-14 NOTE — Telephone Encounter (Signed)
Notes reviewed. EKG did not show QT prolongation. Pls let family know will start low dose Seroquel '25mg'$ : take 1/2 tab qhs. Main side effects can be drowsiness, dizziness, Dr. Tomi Likens had reviewed cardiac black box warning of Seroquel with family and pls confirm that family agrees that benefits outweigh potential risks. Thanks

## 2021-03-24 ENCOUNTER — Ambulatory Visit: Payer: Medicare Other | Admitting: Family Medicine

## 2021-04-05 ENCOUNTER — Ambulatory Visit (INDEPENDENT_AMBULATORY_CARE_PROVIDER_SITE_OTHER): Payer: Medicare Other | Admitting: *Deleted

## 2021-04-05 DIAGNOSIS — Z0000 Encounter for general adult medical examination without abnormal findings: Secondary | ICD-10-CM

## 2021-04-05 NOTE — Progress Notes (Signed)
Subjective:   [name] is a 87 y.o. female who presents for Medicare Annual (Subsequent) preventive examination.  Virtual Visit via Telephone Note  I connected with  [name] on 04/05/21 at 11:00 AM EDT by telephone and verified that I am speaking with the correct person using two identifiers.  Location: Patient: home with husband Provider: office  Persons participating in the virtual visit: patient/Nurse Health Advisor   I discussed the limitations, risks, security and privacy concerns of performing an evaluation and management service by telephone and the availability of in person appointments. The patient expressed understanding and agreed to proceed.  We continued and completed visit with audio only.  Some vital signs may be absent or patient reported.   Massie Maroon, CMA   Review of Systems     Cardiac Risk Factors include: advanced age (>25mn, >>55women);Other (see comment);hypertension, Risk factor comments: afib     Objective:    There were no vitals filed for this visit. There is no height or weight on file to calculate BMI.  Advanced Directives 04/05/2021 11/05/2020 02/06/2020 03/24/2019 04/25/2018 09/30/2017 05/01/2017  Does Patient Have a Medical Advance Directive? Yes No No No No No Yes  Type of AParamedicof ALorenzoLiving will - - - - - -  Does patient want to make changes to medical advance directive? - - - - - - No - Patient declined  Copy of HBroadwayin Chart? No - copy requested - - - - - No - copy requested  Would patient like information on creating a medical advance directive? - - - No - Patient declined No - Patient declined - -  Pre-existing out of facility DNR order (yellow form or pink MOST form) - - - - - - -    Current Medications (verified) Outpatient Encounter Medications as of 04/05/2021  Medication Sig   atorvastatin (LIPITOR) 20 MG tablet TAKE 1 TABLET BY MOUTH EVERY DAY    bisoprolol (ZEBETA) 5 MG tablet TAKE 1 TABLET BY MOUTH EVERY DAY   busPIRone ([name]) 5 MG tablet TAKE 1 TABLET BY MOUTH TWICE A DAY   cholecalciferol (VITAMIN D) 1000 UNITS tablet Take 1,000 Units by mouth daily.   Cyanocobalamin (VITAMIN B-12 PO) Take 1 tablet by mouth daily.   hydrocortisone (ANUSOL-HC) 2.5 % rectal cream Place 1 application rectally at bedtime.   ketoconazole (NIZORAL) 2 % cream Apply 1 application topically daily.   levETIRAcetam (KEPPRA) 1000 MG tablet TAKE 1 TABLET BY MOUTH TWICE A DAY   lidocaine (XYLOCAINE) 5 % ointment Apply 1 application topically as needed for moderate pain (related to shingles).   loperamide (IMODIUM) 2 MG capsule Take 2 mg by mouth daily as needed for diarrhea or loose stools.    NONFORMULARY OR COMPOUNDED ITEM Antifungal solution: Terbinafine 3%, Fluconazole 2%, Tea Tree Oil 5%, Urea 10%, Ibuprofen 2% in DMSO suspension #358m  omeprazole (PRILOSEC) 20 MG capsule Take 20 mg by mouth daily as needed.    QUEtiapine (SEROQUEL) 25 MG tablet take 1/2 tab qhs.If no side effects on 1/2 tab qhs after 1 week, can increase to 1 tab qhs   sertraline (ZOLOFT) 50 MG tablet TAKE 1 TABLET BY MOUTH EVERY DAY   triamcinolone cream (KENALOG) 0.1 % Apply 1 application topically 2 (two) times daily. For 7-10 days maximum   XARELTO 15 MG TABS tablet TAKE 1 TABLET BY MOUTH EVERY DAY WITH SUPPER   [DISCONTINUED] albuterol (VENTOLIN HFA)  108 (90 Base) MCG/ACT inhaler TAKE 2 PUFFS BY MOUTH EVERY 6 HOURS AS NEEDED FOR WHEEZE OR SHORTNESS OF BREATH   No facility-administered encounter medications on file as of 04/05/2021.    Allergies (verified) Ativan [lorazepam], Gabapentin, Propoxyphene hcl, Codeine, Penicillins, and Vancomycin   History: Past Medical History:  Diagnosis Date   ABDOMINAL INCISIONAL HERNIA 04/03/2010       Anxiety    Arthritis    "lots; hands" (05/26/2013)   Blood transfusion    "w/1st miscarriage" (05/26/2013)   COPD (chronic obstructive  pulmonary disease) ([place])    Coronary artery disease    Depression    Diverticulitis of colon with hemorrhage 07/24/2008   DVT (deep venous thrombosis) ([place]) 1954   "after I had my first child" (05/26/2013)   Exertional shortness of breath    Family history of anesthesia complication    "sisters also get sick as a dog" (05/26/2013)   GERD 05/03/2007       GERD (gastroesophageal reflux disease)    H/O hiatal hernia    HCAP (healthcare-associated pneumonia) 06/26/2013   Heart murmur    "when I was a child" (05/26/2013)   History of TIAs    Hyperlipidemia    Hypertension    Keratotic plaque    Laryngeal carcinoma (HCC) hx of ca   remotely with resection and xrt/chronic hoarsemenss   Lymphona, mantle cell, inguinal region/lower limb ([place])    "large c-cell" (05/26/2013)   OSTEOARTHRITIS 05/03/2007   PEPTIC ULCER DISEASE 07/06/2008   Pneumonia 2013   "once" (05/26/2013)   PUD (peptic ulcer disease)    PVD (peripheral vascular disease) ([place])    Seizures ([place])    Stroke ([employer]) 05/26/2013   "very mild; affected my speech for a while" (05/26/2013)   VARICOSE VEINS LOWER EXTREMITIES W/INFLAMMATION 01/15/2009   Past Surgical History:  Procedure Laterality Date   ABDOMINAL EXPLORATION SURGERY  12/01/2012   APPENDECTOMY  09/13/2008   CAROTID ENDARTERECTOMY Bilateral 2004   2004 a month apart right and left   CARPAL TUNNEL RELEASE Left ?1960's   CATARACT EXTRACTION  2005   bilateral   COLON SURGERY  09/13/2008   Laparoscopic assisted converted to open sigmoid colectomy.Archie Endo 09/13/2008 (05/26/2013)   DILATION AND CURETTAGE OF UTERUS  1950's   "had 2 for miscarriages" (05/26/2013)   DIVERTING ILEOSTOMY  09/22/2008   iliostomy for diverticulitis/notes 09/22/2008 (05/26/2013)   HERNIA REPAIR  05/2010   ventral hernia repair   ILEOSTOMY CLOSURE  01/2010   RADICAL NECK DISSECTION  1986   "larynx cancer" (05/26/2013)   TONSILLECTOMY AND ADENOIDECTOMY  1936   "tonsils grew back; no 2nd OR"  (05/26/2013)   TOTAL KNEE ARTHROPLASTY Bilateral    2002 left 2008 right   TUBAL LIGATION  1972   Family History  Problem Relation Age of Onset   Colon cancer Sister        colon   Hyperlipidemia Mother    Stroke Mother    Cancer Father        mesothelioma   Social History   Socioeconomic History   Marital status: Married    Spouse name: Percell Miller   Number of children: 6   Years of education: Not on file   Highest education level: Not on file  Occupational History   Occupation: retired    Fish farm manager: RETIRED  Tobacco Use   Smoking status: Former    Packs/day: 2.50    Years: 37.00    Pack years: 92.50    Types: Cigarettes  Quit date: 09/05/1984    Years since quitting: 36.6   Smokeless tobacco: Never   Tobacco comments:    quit when she was dx with cancer  Vaping Use   Vaping Use: Never used  Substance and Sexual Activity   Alcohol use: No    Alcohol/week: 0.0 standard drinks   Drug use: No   Sexual activity: Not Currently  Other Topics Concern   Not on file  Social History Narrative   Married (husband patient of Dr. Yong Channel). 6 children (lost 2 for total 8). 15 grandkids. 6 greatgrandchildren.    Lives with husband. Husband drives. Patient does not drive.       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       Right Handed. But per patient it changed after the stroke.   Social Determinants of Health   Financial Resource Strain: Not on file  Food Insecurity: Not on file  Transportation Needs: Not on file  Physical Activity: Not on file  Stress: Not on file  Social Connections: Not on file    Tobacco Counseling Counseling given: Not Answered Tobacco comments: quit when she was dx with cancer   Clinical Intake:  Pre-visit preparation completed: Yes  Pain : No/denies pain     BMI - recorded:  (weight 179lbs) Nutritional Risks: None Diabetes: No  How often do you need to have someone help you  when you read instructions, pamphlets, or other written materials from your doctor or pharmacy?: 1 - Never What is the last grade level you completed in school?: high school graduate  Diabetic? No   Interpreter Needed?: No  Information entered by :: Mehek Grega, CMA   Activities of Daily Living In your present state of health, do you have any difficulty performing the following activities: 04/05/2021 01/21/2021  Hearing? Tempie Donning  Vision? N N  Difficulty concentrating or making decisions? N Y  Walking or climbing stairs? N Y  Comment has a walker and rails in the home -  Dressing or bathing? Tempie Donning  Comment husband and daughter helps to get her dressed and bathed -  Doing errands, shopping? Tempie Donning  Comment daughter drives for her -  Conservation officer, nature and eating ? N -  Using the Toilet? N -  In the past six months, have you accidently leaked urine? Y -  Comment wears protection -  Do you have problems with loss of bowel control? Y -  Comment wears protection -  Managing your Medications? N -  Managing your Finances? N -  Housekeeping or managing your Housekeeping? N -  Some recent data might be hidden    Patient Care Team: Marin Olp, MD as PCP - General (Family Medicine) Eustace Moore, MD as Consulting Physician (Neurosurgery) Pieter Partridge, DO as Consulting Physician (Neurology) Harriett Sine, MD as Consulting Physician (Dermatology)  Indicate any recent [place] you may have received from other than [facility] providers in the past year (date may be approximate).     Assessment:   This is a routine wellness examination for [facility].  Hearing/Vision screen Hearing Screening - Comments:: Cannot hear well - is considering hearing aid  Vision Screening - Comments:: Wear glasses - no other problems with vision   Dietary issues and exercise activities discussed: Current Exercise Habits: Home exercise routine, Type of exercise: walking, Time (Minutes): 10, Frequency  (Times/Week): 7, Weekly Exercise (Minutes/Week): 70,  Intensity: Mild, Exercise limited by: orthopedic condition(s);Other - see comments ("old age" arthritis)   Goals Addressed             This Visit's Progress    Activity and Exercise Increased        Notes: wants to keep going like she is       Depression Screen PHQ 2/9 Scores 04/05/2021 01/21/2021 10/22/2020 08/26/2020 10/16/2019 06/06/2019 04/20/2019  PHQ - 2 Score 0 3 0 6 0 1 3  PHQ- 9 Score - '8 8 18 4 5 3  '$ Exception Documentation - - - - - - -    Fall Risk Fall Risk  04/05/2021 02/24/2021 11/05/2020 10/22/2020 04/12/2020  Falls in the past year? 0 1 0 0 1  Number falls in past yr: - 1 0 0 0  Injury with Fall? - 0 0 0 0  Risk for fall due to : Impaired balance/gait;Impaired mobility History of fall(s) - - -  Follow up Education provided Falls evaluation completed - - -    FALL RISK PREVENTION PERTAINING TO THE HOME:  Any stairs in or around the home? Yes  If so, are there any without handrails? No  Home free of loose throw rugs in walkways, pet beds, electrical cords, etc? Yes  Adequate lighting in your home to reduce risk of falls? Yes   ASSISTIVE DEVICES UTILIZED TO PREVENT FALLS:   Use of a cane, walker or w/c? Yes  Walker at all times Grab bars in the bathroom? Yes  Shower chair or bench in shower? Yes  Elevated toilet seat or a handicapped toilet? No   TIMED UP AND GO:  Was the test performed? No . Phone appointment    Cognitive Function: MMSE - Mini Mental State Exam 09/30/2017  Not completed: (No Data)     6CIT Screen 04/05/2021  What Year? 0 points  What month? 0 points  What time? 0 points    Immunizations Immunization History  Administered Date(s) Administered   Fluad Quad(high Dose 65+) 04/20/2019, 04/12/2020   Influenza, High Dose Seasonal PF 05/05/2013, 04/28/2016, 06/16/2018   Influenza,inj,Quad PF,6+ Mos 04/24/2014, 05/06/2015, 04/26/2017   PFIZER(Purple Top)SARS-COV-2 Vaccination 08/17/2019,  09/04/2019   Pneumococcal Conjugate-13 04/11/2015   Pneumococcal Polysaccharide-23 05/05/2013   Tdap 01/27/2011    TDAP status: Due, Education has been provided regarding the importance of this vaccine. Advised may receive this vaccine at local pharmacy or Health Dept. Aware to provide a copy of the vaccination record if obtained from local pharmacy or Health Dept. Verbalized acceptance and understanding.  Flu Vaccine status: Due, Education has been provided regarding the importance of this vaccine. Advised may receive this vaccine at local pharmacy or Health Dept. Aware to provide a copy of the vaccination record if obtained from local pharmacy or Health Dept. Verbalized acceptance and understanding.  Pneumococcal vaccine status: Up to date  Covid-19 vaccine status: Completed vaccines  Qualifies for Shingles Vaccine? Yes   Zostavax completed No   Shingrix Completed?: No.    Education has been provided regarding the importance of this vaccine. Patient has been advised to call insurance company to determine out of pocket expense if they have not yet received this vaccine. Advised may also receive vaccine at local pharmacy or Health Dept. Verbalized acceptance and understanding.  Screening Tests Health Maintenance  Topic Date Due   Zoster Vaccines- Shingrix (1 of 2) Never done   DEXA SCAN  Never done   TETANUS/TDAP  01/26/2021   INFLUENZA VACCINE  03/03/2021  PNA vac Low Risk Adult  Completed   HPV VACCINES  Aged Out   COVID-19 Vaccine  Discontinued    Health Maintenance  Health Maintenance Due  Topic Date Due   Zoster Vaccines- Shingrix (1 of 2) Never done   DEXA SCAN  Never done   TETANUS/TDAP  01/26/2021   INFLUENZA VACCINE  03/03/2021    Colorectal cancer screening: No longer required.   Mammogram status: No longer required due to age.  Bone Density status: Declined at this time  Lung Cancer Screening: (Low Dose CT Chest recommended if Age 66-80 years, 30 pack-year  currently smoking OR have quit w/in 15years.) does not qualify.   Lung Cancer Screening Referral: not indicated   Additional Screening:   Vision Screening: Recommended annual ophthalmology exams for early detection of glaucoma and other disorders of the eye. Is the patient up to date with their annual eye exam?  Yes   Dental Screening: Recommended annual dental exams for proper oral hygiene  Community Resource Referral / Chronic Care Management: CRR required this visit?  No   CCM required this visit?  No      Plan:     I have personally reviewed and noted the following in the patient's chart:   Medical and social history Use of alcohol, tobacco or illicit drugs  Current medications and supplements including opioid prescriptions.  Functional ability and status Nutritional status Physical activity Advanced directives List of other physicians Hospitalizations, surgeries, and ER visits in previous 12 months Vitals Screenings to include cognitive, depression, and falls Referrals and appointments  In addition, I have reviewed and discussed with patient certain preventive protocols, quality metrics, and best practice recommendations. A written personalized care plan for preventive services as well as general preventive health recommendations were provided to patient.     Massie Maroon, [place]   04/05/2021   Nurse Notes: None - non face to face 60 minute visit

## 2021-04-05 NOTE — Patient Instructions (Signed)
[name] , Thank you for taking time to come for your Medicare Wellness Visit. I appreciate your ongoing commitment to your health goals. Please review the following plan we discussed and let me know if I can assist you in the future.   Screening recommendations/referrals:  Recommended yearly ophthalmology/optometry visit for glaucoma screening and checkup Recommended yearly dental visit for hygiene and checkup  Vaccinations: Influenza vaccine: Due Pneumococcal vaccine: U to date Tdap vaccine: Due discuss with pharmacy or primary care provider  Shingles vaccine: Due   Covid-19: Up to date   Advanced directives: Please bring a copy of your completed advanced directives and living will to your primary care providers office    Next appointment: Follow up in one year for your annual wellness visit    Preventive Care 65 Years and Older, Female Preventive care refers to lifestyle choices and visits with your health care provider that can promote health and wellness. What does preventive care include? A yearly physical exam. This is also called an annual well check. Dental exams once or twice a year. Routine eye exams. Ask your health care provider how often you should have your eyes checked. Personal lifestyle choices, including: Daily care of your teeth and gums. Regular physical activity. Eating a healthy diet. Avoiding tobacco and drug use. Limiting alcohol use. Practicing safe sex. Taking low-dose aspirin every day. Taking vitamin and mineral supplements as recommended by your health care provider. What happens during an annual well check? The services and screenings done by your health care provider during your annual well check will depend on your age, overall health, lifestyle risk factors, and family history of disease. Counseling  Your health care provider may ask you questions about your: Alcohol use. Tobacco use. Drug use. Emotional well-being. Home and  relationship well-being. Sexual activity. Eating habits. History of falls. Memory and ability to understand (cognition). Work and work Statistician. Reproductive health. Screening  You may have the following tests or measurements: Height, weight, and BMI. Blood pressure. Lipid and cholesterol levels. These may be checked every 5 years, or more frequently if you are over 17 years old. Skin check. Lung cancer screening. You may have this screening every year starting at age 55 if you have a 30-pack-year history of smoking and currently smoke or have quit within the past 15 years. Fecal occult blood test (FOBT) of the stool. You may have this test every year starting at age 75. Flexible sigmoidoscopy or colonoscopy. You may have a sigmoidoscopy every 5 years or a colonoscopy every 10 years starting at age 73. Hepatitis C blood test. Hepatitis B blood test. Sexually transmitted disease (STD) testing. Diabetes screening. This is done by checking your blood sugar (glucose) after you have not eaten for a while (fasting). You may have this done every 1-3 years. Bone density scan. This is done to screen for osteoporosis. You may have this done starting at age 68. Mammogram. This may be done every 1-2 years. Talk to your health care provider about how often you should have regular mammograms. Talk with your health care provider about your test results, treatment options, and if necessary, the need for more tests. Vaccines  Your health care provider may recommend certain vaccines, such as: Influenza vaccine. This is recommended every year. Tetanus, diphtheria, and acellular pertussis (Tdap, Td) vaccine. You may need a Td booster every 10 years. Zoster vaccine. You may need this after age 98. Pneumococcal 13-valent conjugate (PCV13) vaccine. One dose is recommended after age 80.  Pneumococcal polysaccharide (PPSV23) vaccine. One dose is recommended after age 51. Talk to your health care provider  about which screenings and vaccines you need and how often you need them. This information is not intended to replace advice given to you by your health care provider. Make sure you discuss any questions you have with your health care provider. Document Released: 08/16/2015 Document Revised: 04/08/2016 Document Reviewed: 05/21/2015 Elsevier Interactive Patient Education  2017 [place] Prevention in the Home Falls can cause injuries. They can happen to people of all ages. There are many things you can do to make your home safe and to help prevent falls. What can I do on the outside of my home? Regularly fix the edges of walkways and driveways and fix any cracks. Remove anything that might make you trip as you walk through a door, such as a raised step or threshold. Trim any bushes or trees on the path to your home. Use bright outdoor lighting. Clear any walking paths of anything that might make someone trip, such as rocks or tools. Regularly check to see if handrails are loose or broken. Make sure that both sides of any steps have handrails. Any raised decks and porches should have guardrails on the edges. Have any leaves, snow, or ice cleared regularly. Use sand or salt on walking paths during winter. Clean up any spills in your garage right away. This includes oil or grease spills. What can I do in the bathroom? Use night lights. Install grab bars by the toilet and in the tub and shower. Do not use towel bars as grab bars. Use non-skid mats or decals in the tub or shower. If you need to sit down in the shower, use a plastic, non-slip stool. Keep the floor dry. Clean up any water that spills on the floor as soon as it happens. Remove soap buildup in the tub or shower regularly. Attach bath mats securely with double-sided non-slip rug tape. Do not have throw rugs and other things on the floor that can make you trip. What can I do in the bedroom? Use night lights. Make sure  that you have a light by your bed that is easy to reach. Do not use any sheets or blankets that are too big for your bed. They should not hang down onto the floor. Have a firm chair that has side arms. You can use this for support while you get dressed. Do not have throw rugs and other things on the floor that can make you trip. What can I do in the kitchen? Clean up any spills right away. Avoid walking on wet floors. Keep items that you use a lot in easy-to-reach places. If you need to reach something above you, use a strong step stool that has a grab bar. Keep electrical cords out of the way. Do not use floor polish or wax that makes floors slippery. If you must use wax, use non-skid floor wax. Do not have throw rugs and other things on the floor that can make you trip. What can I do with my stairs? Do not leave any items on the stairs. Make sure that there are handrails on both sides of the stairs and use them. Fix handrails that are broken or loose. Make sure that handrails are as long as the stairways. Check any carpeting to make sure that it is firmly attached to the stairs. Fix any carpet that is loose or worn. Avoid having throw rugs at the  top or bottom of the stairs. If you do have throw rugs, attach them to the floor with carpet tape. Make sure that you have a light switch at the top of the stairs and the bottom of the stairs. If you do not have them, ask someone to add them for you. What else can I do to help prevent falls? Wear shoes that: Do not have high heels. Have rubber bottoms. Are comfortable and fit you well. Are closed at the toe. Do not wear sandals. If you use a stepladder: Make sure that it is fully opened. Do not climb a closed stepladder. Make sure that both sides of the stepladder are locked into place. Ask someone to hold it for you, if possible. Clearly mark and make sure that you can see: Any grab bars or handrails. First and last steps. Where the edge of  each step is. Use tools that help you move around (mobility aids) if they are needed. These include: Canes. Walkers. Scooters. Crutches. Turn on the lights when you go into a dark area. Replace any light bulbs as soon as they burn out. Set up your furniture so you have a clear path. Avoid moving your furniture around. If any of your floors are uneven, fix them. If there are any pets around you, be aware of where they are. Review your medicines with your doctor. Some medicines can make you feel dizzy. This can increase your chance of falling. Ask your doctor what other things that you can do to help prevent falls. This information is not intended to replace advice given to you by your health care provider. Make sure you discuss any questions you have with your health care provider. Document Released: 05/16/2009 Document Revised: 12/26/2015 Document Reviewed: 08/24/2014 Elsevier Interactive Patient Education  2017 [employer].

## 2021-05-06 ENCOUNTER — Encounter: Payer: Self-pay | Admitting: Family Medicine

## 2021-05-06 ENCOUNTER — Telehealth (INDEPENDENT_AMBULATORY_CARE_PROVIDER_SITE_OTHER): Payer: Medicare Other | Admitting: Family Medicine

## 2021-05-06 VITALS — BP 145/86 | HR 71 | Temp 97.5°F | Ht 64.0 in | Wt 170.0 lb

## 2021-05-06 DIAGNOSIS — J449 Chronic obstructive pulmonary disease, unspecified: Secondary | ICD-10-CM

## 2021-05-06 DIAGNOSIS — R053 Chronic cough: Secondary | ICD-10-CM | POA: Diagnosis not present

## 2021-05-06 DIAGNOSIS — R451 Restlessness and agitation: Secondary | ICD-10-CM

## 2021-05-06 DIAGNOSIS — I4891 Unspecified atrial fibrillation: Secondary | ICD-10-CM

## 2021-05-06 DIAGNOSIS — E785 Hyperlipidemia, unspecified: Secondary | ICD-10-CM

## 2021-05-06 MED ORDER — ALBUTEROL SULFATE HFA 108 (90 BASE) MCG/ACT IN AERS: INHALATION_SPRAY | RESPIRATORY_TRACT | 1 refills | Status: DC

## 2021-05-06 MED ORDER — BENZONATATE 100 MG PO CAPS
100.0000 mg | ORAL_CAPSULE | Freq: Two times a day (BID) | ORAL | 1 refills | Status: DC | PRN
Start: 2021-05-06 — End: 2021-06-02

## 2021-05-06 NOTE — Progress Notes (Signed)
Phone [phone] Virtual visit via Video note   Subjective:  Chief complaint: Chief Complaint  Patient presents with   Cough    A lot of phlem on and off for about 3-4 months.    This visit type was conducted due to national recommendations for restrictions regarding the COVID-19 Pandemic (e.g. social distancing).  This format is felt to be most appropriate for this patient at this time balancing risks to patient and risks to population by having him in for in person visit.  No physical exam was performed (except for noted visual exam or audio findings with Telehealth visits).    Our team/I connected with [name] at  4:00 PM EDT by a video enabled telemedicine application ([email or web address] or caregility through epic) and verified that I am speaking with the correct person using two identifiers.  Location patient: Home-[place] Location provider: [facility], office Persons participating in the virtual visit:  patient  Our team/I discussed the limitations of evaluation and management by telemedicine and the availability of in person appointments. In light of current covid-19 pandemic, patient also understands that we are trying to protect them by minimizing in office contact if at all possible.  The patient expressed consent for telemedicine visit and agreed to proceed. Patient understands insurance will be billed.   Past Medical History-  Patient Active Problem List   Diagnosis Date Noted   Major neurocognitive disorder ([place]) 02/24/2021    Priority: 1.   Memory loss 12/14/2019    Priority: 1.   Diastolic CHF ([place]) 38/46/6599    Priority: 1.   History of cardioembolic stroke 35/70/1779    Priority: 1.   Spinal stenosis, lumbar region, with neurogenic claudication 05/09/2014    Priority: 1.   History of Focal seizure ([place]) 06/16/2013    Priority: 1.   History of TIA (transient ischemic attack) 05/31/2013    Priority: 1.   History of Large B-cell lymphoma  02/18/2012    Priority: 1.    Atrial fibrillation ([place]) 12/14/2008    Priority: 1.   Mild aortic stenosis 08/26/2020    Priority: 2.   Recurrent major depressive disorder, in full remission ([place]) 10/16/2019    Priority: 2.   Agitation 03/11/2018    Priority: 2.   History of chronic respiratory failure- prior on o2 06/15/2017    Priority: 2.   Bacteremia due to Streptococcus 11/21/2015    Priority: 2.   Cellulitis of leg, right 11/20/2015    Priority: 2.   Diverticulitis of colon 09/26/2014    Priority: 2.   CKD (chronic kidney disease), stage III ([place]) 09/26/2014    Priority: 2.   Laryngeal carcinoma ([place])     Priority: 2.   Anxiety state 10/20/2008    Priority: 2.   Depression 10/20/2008    Priority: 2.   Dyspnea on exertion 07/06/2008    Priority: 2.   COPD GOLD II 03/26/2008    Priority: 2.   GERD (gastroesophageal reflux disease) 05/03/2007    Priority: 2.   Hyperlipidemia 03/18/2007    Priority: 2.   Essential hypertension 03/18/2007    Priority: 2.   Loose stools 06/01/2019    Priority: 3.   Hemorrhoids 06/01/2019    Priority: 3.   Chronic diarrhea 09/04/2017    Priority: 3.   Aortic atherosclerosis ([place]) 06/30/2016    Priority: 3.   Weakness generalized 06/30/2013    Priority: 3.   Bilateral hand pain 01/09/2013    Priority: 3.  Medications- reviewed and updated Current Outpatient Medications  Medication Sig Dispense Refill   atorvastatin (LIPITOR) 20 MG tablet TAKE 1 TABLET BY MOUTH EVERY DAY 90 tablet 1   benzonatate (TESSALON) 100 MG capsule Take 1 capsule (100 mg total) by mouth 2 (two) times daily as needed for cough. 20 capsule 1   bisoprolol (ZEBETA) 5 MG tablet TAKE 1 TABLET BY MOUTH EVERY DAY 90 tablet 1   busPIRone ([name]) 5 MG tablet TAKE 1 TABLET BY MOUTH TWICE A DAY 180 tablet 3   cholecalciferol (VITAMIN D) 1000 UNITS tablet Take 1,000 Units by mouth daily.     Cyanocobalamin (VITAMIN B-12 PO) Take 1 tablet by mouth daily.     hydrocortisone (ANUSOL-HC) 2.5 %  rectal cream Place 1 application rectally at bedtime. 30 g 0   ketoconazole (NIZORAL) 2 % cream Apply 1 application topically daily. 15 g 0   levETIRAcetam (KEPPRA) 1000 MG tablet TAKE 1 TABLET BY MOUTH TWICE A DAY 180 tablet 1   lidocaine (XYLOCAINE) 5 % ointment Apply 1 application topically as needed for moderate pain (related to shingles). 240 g 0   loperamide (IMODIUM) 2 MG capsule Take 2 mg by mouth daily as needed for diarrhea or loose stools.      NONFORMULARY OR COMPOUNDED ITEM Antifungal solution: Terbinafine 3%, Fluconazole 2%, Tea Tree Oil 5%, Urea 10%, Ibuprofen 2% in DMSO suspension #61mL 1 each 3   omeprazole (PRILOSEC) 20 MG capsule Take 20 mg by mouth daily as needed.      QUEtiapine (SEROQUEL) 25 MG tablet take 1/2 tab qhs.If no side effects on 1/2 tab qhs after 1 week, can increase to 1 tab qhs 30 tablet 1   sertraline (ZOLOFT) 50 MG tablet TAKE 1 TABLET BY MOUTH EVERY DAY 90 tablet 1   triamcinolone cream (KENALOG) 0.1 % Apply 1 application topically 2 (two) times daily. For 7-10 days maximum 80 g 0   XARELTO 15 MG TABS tablet TAKE 1 TABLET BY MOUTH EVERY DAY WITH SUPPER 30 tablet 10   albuterol (VENTOLIN HFA) 108 (90 Base) MCG/ACT inhaler TAKE 2 PUFFS BY MOUTH EVERY 6 HOURS AS NEEDED FOR WHEEZE OR SHORTNESS OF BREATH 18 g 1   No current facility-administered medications for this visit.     Objective:  BP (!) 145/86   Pulse 71   Temp (!) 97.5 F (36.4 C) (Temporal)   Ht 5\' 4"  (1.626 m)   Wt 170 lb (77.1 kg)   LMP  (LMP Unknown)   BMI 29.18 kg/m  self reported vitals Gen: NAD, resting comfortably Lungs: nonlabored, normal respiratory rate  Skin: appears dry, no obvious rash     Assessment and Plan  # Persistent Cough for 3-4 months #COPD S: Patient complains today of having an ongoing cough. Onset was 3-4 months ago. Chokes on phlegm in the morning- usually clear. Cough has increased during this time. Last CXR in may of 2021. She is wheezing a fair amount. Has  albuterol but not trying it. Dry mouth but wet cough- doesn't seem to get everything up with cough.  Denies hemoptysis. Has had some weight loss at home  Patient does have known COPD.  Mucinex can be helpful for her but struggles to stay hydrated so this is challenging. A/P: Patient with baseline COPD and with chronic cough for 3 to 4 months.  Will get chest x-ray to evaluate infectious cause or underlying mass.  Recommended trial of albuterol-Baseline COPD appears poorly controlled.  Discussed pulmonology referral  or controller inhaler -Patient also requested cough medicine-we will try Tessalon  #hypertension S: medication: bisoprolol 5 mg daily,  Home readings #s: Mildly elevated today and yesterday BP Readings from Last 3 Encounters:  05/06/21 (!) 145/86  02/24/21 (!) 110/58  01/21/21 136/72  A/P: Last blood pressure reading in office was actually low normal-encouraged family to continue to monitor blood pressure and if remains elevated to let me know-would prefer less than 135/85 ideally with aortic atherosclerosis  #Agitation with dementia-follows with neurology Dr. Tomi Likens.  Patient appeared less agitated on exam today.  I believe she has been taking her Seroquel but we will check on this specifically at next visit.  Also continues sertraline 50 mg -We had also considered reducing buspirone if did well with Seroquel-discussed at next visit   # Atrial fibrillation S: Rate controlled with Bisoprolol 5 mg Anticoagulated with Xarelto 15 mg daily - adjusted for renal function A/P: Appropriately anticoagulated and rate controlled-continue current medication  Recommended follow up: follow up if fails to improve.  Otherwise every 3 to 6 months is reasonable-we did not specifically discuss scheduling this low. Future Appointments  Date Time Provider [place]  06/09/2021  1:30 PM Pieter Partridge, DO [facility] None  07/07/2021  3:00 PM Gardiner Barefoot, DPM [facility]  04/10/2022   3:15 PM [facility] HEALTH COACH [facility]    Lab/Order associations:   ICD-10-CM   1. Chronic cough  R05.3 DG Chest 2 View    2. COPD GOLD II  J44.9     3. Agitation  R45.1     4. Atrial fibrillation, unspecified type ([place])  I48.91     5. Hyperlipidemia, unspecified hyperlipidemia type  E78.5       Meds ordered this encounter  Medications   albuterol (VENTOLIN HFA) 108 (90 Base) MCG/ACT inhaler    Sig: TAKE 2 PUFFS BY MOUTH EVERY 6 HOURS AS NEEDED FOR WHEEZE OR SHORTNESS OF BREATH    Dispense:  18 g    Refill:  1   benzonatate (TESSALON) 100 MG capsule    Sig: Take 1 capsule (100 mg total) by mouth 2 (two) times daily as needed for cough.    Dispense:  20 capsule    Refill:  1   Return precautions advised.  [name], MD

## 2021-05-07 ENCOUNTER — Ambulatory Visit (INDEPENDENT_AMBULATORY_CARE_PROVIDER_SITE_OTHER)
Admission: RE | Admit: 2021-05-07 | Discharge: 2021-05-07 | Disposition: A | Discharge status: Home / Self Care | Payer: Medicare Other | Source: Ambulatory Visit | Attending: Family Medicine | Admitting: Family Medicine

## 2021-05-07 ENCOUNTER — Other Ambulatory Visit: Payer: Self-pay

## 2021-05-07 DIAGNOSIS — R053 Chronic cough: Secondary | ICD-10-CM | POA: Diagnosis not present

## 2021-05-08 ENCOUNTER — Other Ambulatory Visit: Payer: Self-pay | Admitting: Family Medicine

## 2021-05-12 ENCOUNTER — Telehealth: Payer: Self-pay

## 2021-05-12 NOTE — Telephone Encounter (Signed)
Pt's husband called regarding X-ray results. He would like a call back. Please Advise.

## 2021-05-12 NOTE — Telephone Encounter (Signed)
LAST APPOINTMENT DATE:  05/06/21  NEXT APPOINTMENT DATE: none  MEDICATION:atorvastatin (LIPITOR) 20 MG tablet  Is the patient out of medication? Yes  PHARMACY: [id number] - [place], [place]

## 2021-05-12 NOTE — Telephone Encounter (Signed)
Dosent look like they have been commented on yet.

## 2021-05-12 NOTE — Telephone Encounter (Signed)
I just commented-if you see it in my in basket still for patients you can tell them that I will generally comment within the next 24 to 48 hours

## 2021-05-13 NOTE — Telephone Encounter (Signed)
Refill sent to pharmacy.   

## 2021-05-14 NOTE — Telephone Encounter (Signed)
Results viewed on mychart

## 2021-06-01 ENCOUNTER — Other Ambulatory Visit: Payer: Self-pay | Admitting: Family Medicine

## 2021-06-06 NOTE — Progress Notes (Signed)
NEUROLOGY FOLLOW UP OFFICE NOTE  [name] [id number]  Assessment/Plan:   1.  Major neurocognitive disorder.  Depression continues to be a significant factor. 2.  Focal onset epilepsy without impaired consciousness 3  Chronic pain related to lumbar spinal stenosis - due to age and risk of stroke off AC, surgery declined.   4  History of cardioembolic stroke 5. Depression   1.  For sundowning as well as paranoia during day, Increase quetiapine to 12.5mg  in morning (to treat daytime confusion and agitation) and 25mg  at night (take at 7 PM).  If daytime symptoms not problematic, may take 50mg  at 7 PM.  Advised family to reassure patient. 2.  Keppra 1000mg  twice daily 3.  sertraline 50mg  daily 4   Follow up in 6 months.  Subjective:  [name] is an 87 year old right-handed female with lumbar stenosis, and history of symptomatic seizure secondary to cardioembolic stroke who follows up for dementia.  Accompanied by husband and daughter (other daughter via phone) who supplement history.   UPDATE: Current medications:  Keppra 1000mg  twice daily; [name], gabapentin 200mg  at bedtime, sertraline 50mg  daily, quetiapine 25mg  at night  She continues to have significant irritability and anger.  She has paranoia, believing that people are out to do something bad to her.  Sometimes she thinks that there is an imposter of her husband also in the house.  For the past couple of years, she has had a tremor in her hands which has been more prominent over the past several months. It occurs with action and at rest.  Increased sertraline helped with mood for a little while.  Overall, depressed.  Does not bathe and dress everyday.  If she is sitting, she will easily start napping.  EKG in August was okay so she was started on quetiapine at night to treat paranoia/Capgras syndrome (thinks husband is an Agricultural consultant).  Usually occurs at night, starts right at 8 PM after [employer].  However,  husband says it sometimes happens during the day.    HISTORY: She has had several episodes of confusion and aphasia in October and November of 2014, associated with stroke and presumed seizure.  On 05/26/13, MRI of the brain was performed, which revealed an acute lacunar infarct in the left anterior corona radiata and lentiform nucleus.  CTA of the head revealed atherosclerotic changes in the cavernous carotid arteries without significant stenosis.  CTA of neck revealed focal 50% stenosis of mid right ICA.  She was switched from [name] to Eliquis.  She was readmitted to the hospital on 05/31/13 with recurrent aphasia.  She had trouble getting her words out and then couldn't speak at all.  Repeat MRI of brain revealed new punctate infarct in the left posterior corona radiata, the same location as previous infarct. No changes in management were made.  She was readmitted to the hospital on 06/07/13 for altered mental status.  EEG was performed, showing left fronto-parieto-temporal theta slowing.  Due to possibility of seizure, she was started on Keppra 250mg  BID.  No change made to Eliquis.  She presented to the ED on 06/16/13 after waking up in the morning with expressive aphasia and right-sided weakness.  Symptoms improved in the ED.  CT head revealed prior lacunar infarct in left lentiform nucleus and corona radiata, but no new abnormalities.  It was suspected that she may have had another seizure and her Keppra was increased to 500mg  BID.  She was admitted to the hospital  again on 06/21/13 again for expressive aphasia.  MRI brain revealed 2 new punctate infarcts in the bilateral hemispheres at level of centrum semiovale.  Eliquis was changed to Xarelto. Keppra was increased to 750mg  BID.     She presented to the ED on 08/05/14 for recurrent episode of aphasia, lasting 1.5 hours.  This was more significant than the previous year.  She described it as knowing what she wanted to say but having trouble getting the  words out.  She had trouble writing as well.  MRI of brain revealed no acute infarct.  She had an episode two days prior, lasting 1 hour.  Carotid doppler revealed no hemodynamically significant ICA stenosis.     She was admitted to [facility] from 05/01/17 to 05/03/17 for altered mental status and generalized weakness with 2 seizures over the weekend.  She was found by her daughter sitting in the dark moaning with foaming at the mouth and left sided shaking lasting a couple of minutes.  She was altered for much longer time.  CT of head from 04/30/17 and 05/01/17 revealed no acute changes. She was found to have a temperature of 100.3.  She was found to have Klebsiella UTI/cystitis on UA and culture.  She was loaded with Keppra and treated with Cipro.  Keppra was increased to 1000mg  twice daily.  About a week prior, she reportedly had another brief episode of jibberish speech and right facial droop, lasting 2-3 minutes.   In 2019, she has had significant depression related to her chronic pain, loss of independence (such as no longer able to drive), and the death of her daughter.  Back in June, we try to transition her from [place] to [name] in an attempt to alleviate some of her irritability.  She began experiencing increased confusion, falls, irritability and paranoia.  She was subsequently switched back from [name] to [place].  She was started on [name] to help with agitation.    PAST MEDICAL HISTORY: Past Medical History:  Diagnosis Date   ABDOMINAL INCISIONAL HERNIA 04/03/2010       Anxiety    Arthritis    "lots; hands" (05/26/2013)   Blood transfusion    "w/1st miscarriage" (05/26/2013)   COPD (chronic obstructive pulmonary disease) (HCC)    Coronary artery disease    Depression    Diverticulitis of colon with hemorrhage 07/24/2008   DVT (deep venous thrombosis) ([place]) 1954   "after I had my first child" (05/26/2013)   Exertional shortness of breath    Family history of anesthesia  complication    "sisters also get sick as a dog" (05/26/2013)   GERD 05/03/2007       GERD (gastroesophageal reflux disease)    H/O hiatal hernia    HCAP (healthcare-associated pneumonia) 06/26/2013   Heart murmur    "when I was a child" (05/26/2013)   History of TIAs    Hyperlipidemia    Hypertension    Keratotic plaque    Laryngeal carcinoma (HCC) hx of ca   remotely with resection and xrt/chronic hoarsemenss   Lymphona, mantle cell, inguinal region/lower limb ([place])    "large c-cell" (05/26/2013)   OSTEOARTHRITIS 05/03/2007   PEPTIC ULCER DISEASE 07/06/2008   Pneumonia 2013   "once" (05/26/2013)   PUD (peptic ulcer disease)    PVD (peripheral vascular disease) ([place])    Seizures ([place])    Stroke ([place]) 05/26/2013   "very mild; affected my speech for a while" (05/26/2013)   VARICOSE VEINS LOWER EXTREMITIES W/INFLAMMATION  01/15/2009    MEDICATIONS: Current Outpatient Medications on File Prior to Visit  Medication Sig Dispense Refill   albuterol (VENTOLIN HFA) 108 (90 Base) MCG/ACT inhaler TAKE 2 PUFFS BY MOUTH EVERY 6 HOURS AS NEEDED FOR WHEEZE OR SHORTNESS OF BREATH 18 g 1   atorvastatin (LIPITOR) 20 MG tablet TAKE 1 TABLET BY MOUTH EVERY DAY 90 tablet 1   benzonatate (TESSALON) 100 MG capsule TAKE 1 CAPSULE BY MOUTH 2 TIMES DAILY AS NEEDED FOR COUGH. 20 capsule 1   bisoprolol (ZEBETA) 5 MG tablet TAKE 1 TABLET BY MOUTH EVERY DAY 90 tablet 1   busPIRone ([name]) 5 MG tablet TAKE 1 TABLET BY MOUTH TWICE A DAY 180 tablet 3   cholecalciferol (VITAMIN D) 1000 UNITS tablet Take 1,000 Units by mouth daily.     Cyanocobalamin (VITAMIN B-12 PO) Take 1 tablet by mouth daily.     hydrocortisone (ANUSOL-HC) 2.5 % rectal cream Place 1 application rectally at bedtime. 30 g 0   ketoconazole (NIZORAL) 2 % cream Apply 1 application topically daily. 15 g 0   levETIRAcetam (KEPPRA) 1000 MG tablet TAKE 1 TABLET BY MOUTH TWICE A DAY 180 tablet 1   lidocaine (XYLOCAINE) 5 % ointment Apply 1  application topically as needed for moderate pain (related to shingles). 240 g 0   loperamide (IMODIUM) 2 MG capsule Take 2 mg by mouth daily as needed for diarrhea or loose stools.      NONFORMULARY OR COMPOUNDED ITEM Antifungal solution: Terbinafine 3%, Fluconazole 2%, Tea Tree Oil 5%, Urea 10%, Ibuprofen 2% in DMSO suspension #43mL 1 each 3   omeprazole (PRILOSEC) 20 MG capsule Take 20 mg by mouth daily as needed.      QUEtiapine (SEROQUEL) 25 MG tablet take 1/2 tab qhs.If no side effects on 1/2 tab qhs after 1 week, can increase to 1 tab qhs 30 tablet 1   sertraline (ZOLOFT) 50 MG tablet TAKE 1 TABLET BY MOUTH EVERY DAY 90 tablet 1   triamcinolone cream (KENALOG) 0.1 % Apply 1 application topically 2 (two) times daily. For 7-10 days maximum 80 g 0   XARELTO 15 MG TABS tablet TAKE 1 TABLET BY MOUTH EVERY DAY WITH SUPPER 30 tablet 10   No current facility-administered medications on file prior to visit.    ALLERGIES: Allergies  Allergen Reactions   Ativan [Lorazepam] Other (See Comments)    Severe hallucinations    Gabapentin     encephalopathy   Propoxyphene Hcl Nausea And Vomiting   Codeine Itching   Penicillins Rash    Has patient had a PCN reaction causing immediate rash, facial/tongue/throat swelling, SOB or lightheadedness with hypotension: Yes Has patient had a PCN reaction causing severe rash involving mucus membranes or skin necrosis: Unk Has patient had a PCN reaction that required hospitalization: No Has patient had a PCN reaction occurring within the last 10 years: Yes If all of the above answers are "NO", then may proceed with Cephalosporin use.    Vancomycin Rash    FAMILY HISTORY: Family History  Problem Relation Age of Onset   Colon cancer Sister        colon   Hyperlipidemia Mother    Stroke Mother    Cancer Father        mesothelioma      Objective:  Height 5\' 4"  (1.626 m), weight 168 lb 9.6 oz (76.5 kg). General: No acute distress.  Patient appears  well-groomed.     Metta Clines, DO  CC: [name],  MD

## 2021-06-09 ENCOUNTER — Encounter: Payer: Self-pay | Admitting: Neurology

## 2021-06-09 ENCOUNTER — Ambulatory Visit (INDEPENDENT_AMBULATORY_CARE_PROVIDER_SITE_OTHER): Payer: Medicare Other | Admitting: Neurology

## 2021-06-09 ENCOUNTER — Other Ambulatory Visit: Payer: Self-pay

## 2021-06-09 VITALS — Ht 64.0 in | Wt 168.6 lb

## 2021-06-09 DIAGNOSIS — G40109 Localization-related (focal) (partial) symptomatic epilepsy and epileptic syndromes with simple partial seizures, not intractable, without status epilepticus: Secondary | ICD-10-CM

## 2021-06-09 DIAGNOSIS — F0390 Unspecified dementia without behavioral disturbance: Secondary | ICD-10-CM | POA: Diagnosis not present

## 2021-06-09 DIAGNOSIS — F3342 Major depressive disorder, recurrent, in full remission: Secondary | ICD-10-CM

## 2021-06-09 DIAGNOSIS — I639 Cerebral infarction, unspecified: Secondary | ICD-10-CM

## 2021-06-09 MED ORDER — QUETIAPINE FUMARATE 25 MG PO TABS: ORAL_TABLET | ORAL | 5 refills | Status: DC

## 2021-06-09 NOTE — Patient Instructions (Addendum)
Take quetiapine 25mg  - take 1/2 tablet in morning (to help with daytime confusion) and 1 tablet at night (at 7 PM).  Otherwise, you can take 1 and 1/2 tablets at night (7 AM) Keep lights on at night. Continue sertraline 50mg  daily Continue levetiracetam 1000mg  twice daily

## 2021-06-10 ENCOUNTER — Other Ambulatory Visit: Payer: Self-pay | Admitting: Neurology

## 2021-06-13 ENCOUNTER — Telehealth (INDEPENDENT_AMBULATORY_CARE_PROVIDER_SITE_OTHER): Payer: Medicare Other | Admitting: Family

## 2021-06-13 ENCOUNTER — Telehealth: Payer: Self-pay

## 2021-06-13 ENCOUNTER — Encounter: Payer: Self-pay | Admitting: Family

## 2021-06-13 VITALS — Ht 64.0 in | Wt 168.7 lb

## 2021-06-13 DIAGNOSIS — H1031 Unspecified acute conjunctivitis, right eye: Secondary | ICD-10-CM | POA: Diagnosis not present

## 2021-06-13 MED ORDER — BACITRACIN-POLYMYXIN B 500-10000 UNIT/GM OP OINT: 1.0000 "application " | TOPICAL_OINTMENT | Freq: Two times a day (BID) | OPHTHALMIC | 0 refills | Status: DC

## 2021-06-13 MED ORDER — POLYMYXIN B-TRIMETHOPRIM 10000-0.1 UNIT/ML-% OP SOLN: 1.0000 [drp] | OPHTHALMIC | 0 refills | Status: DC

## 2021-06-13 NOTE — Telephone Encounter (Signed)
FYI, Dr. B pt daughter said he is a pt of yours.

## 2021-06-13 NOTE — Telephone Encounter (Signed)
Noted  

## 2021-06-13 NOTE — Telephone Encounter (Signed)
Appreciate her being worked in

## 2021-06-13 NOTE — Progress Notes (Signed)
MyChart Video Visit    Virtual Visit via Video Note   This visit type was conducted due to national recommendations for restrictions regarding the COVID-19 Pandemic (e.g. social distancing) in an effort to limit this patient's exposure and mitigate transmission in our community. This patient is at least at moderate risk for complications without adequate follow up. This format is felt to be most appropriate for this patient at this time. Physical exam was limited by quality of the video and audio technology used for the visit. CMA was able to get the patient set up on a video visit.  Patient location: Home. Patient and provider in visit Provider location: Office  I discussed the limitations of evaluation and management by telemedicine and the availability of in person appointments. The patient expressed understanding and agreed to proceed.  Visit Date: 06/13/2021  Today's healthcare provider: Jeanie Sewer, NP     Subjective:    Patient ID: [name], female    DOB: 11/29/1927, 87 y.o.   MRN: [id number]  Chief Complaint  Patient presents with   Eye Drainage    Symptoms starting today.     HPI Eye drainage: pt daughter lives with pt and reports pt right eye crusted over this am, matted, pt rubbing eye due to irritation. Left eye with no drainage or irritation. Pt has been exposed to several young children recently.   Past Medical History:  Diagnosis Date   ABDOMINAL INCISIONAL HERNIA 04/03/2010       Anxiety    Arthritis    "lots; hands" (05/26/2013)   Blood transfusion    "w/1st miscarriage" (05/26/2013)   COPD (chronic obstructive pulmonary disease) (HCC)    Coronary artery disease    Depression    Diverticulitis of colon with hemorrhage 07/24/2008   DVT (deep venous thrombosis) ([place]) 1954   "after I had my first child" (05/26/2013)   Exertional shortness of breath    Family history of anesthesia complication    "sisters also get sick as a dog"  (05/26/2013)   GERD 05/03/2007       GERD (gastroesophageal reflux disease)    H/O hiatal hernia    HCAP (healthcare-associated pneumonia) 06/26/2013   Heart murmur    "when I was a child" (05/26/2013)   History of TIAs    Hyperlipidemia    Hypertension    Keratotic plaque    Laryngeal carcinoma (HCC) hx of ca   remotely with resection and xrt/chronic hoarsemenss   Lymphona, mantle cell, inguinal region/lower limb ([place])    "large c-cell" (05/26/2013)   OSTEOARTHRITIS 05/03/2007   PEPTIC ULCER DISEASE 07/06/2008   Pneumonia 2013   "once" (05/26/2013)   PUD (peptic ulcer disease)    PVD (peripheral vascular disease) ([place])    Seizures ([place])    Stroke ([place]) 05/26/2013   "very mild; affected my speech for a while" (05/26/2013)   VARICOSE VEINS LOWER EXTREMITIES W/INFLAMMATION 01/15/2009    Past Surgical History:  Procedure Laterality Date   ABDOMINAL EXPLORATION SURGERY  12/01/2012   APPENDECTOMY  09/13/2008   CAROTID ENDARTERECTOMY Bilateral 2004   2004 a month apart right and left   CARPAL TUNNEL RELEASE Left ?1960's   CATARACT EXTRACTION  2005   bilateral   COLON SURGERY  09/13/2008   Laparoscopic assisted converted to open sigmoid colectomy.Archie Endo 09/13/2008 (05/26/2013)   DILATION AND CURETTAGE OF UTERUS  1950's   "had 2 for miscarriages" (05/26/2013)   DIVERTING ILEOSTOMY  09/22/2008   iliostomy for diverticulitis/notes 09/22/2008 (  05/26/2013)   HERNIA REPAIR  05/2010   ventral hernia repair   ILEOSTOMY CLOSURE  01/2010   RADICAL NECK DISSECTION  1986   "larynx cancer" (05/26/2013)   TONSILLECTOMY AND ADENOIDECTOMY  1936   "tonsils grew back; no 2nd OR" (05/26/2013)   TOTAL KNEE ARTHROPLASTY Bilateral    2002 left 2008 right   TUBAL LIGATION  1972    Outpatient Medications Prior to Visit  Medication Sig Dispense Refill   albuterol (VENTOLIN HFA) 108 (90 Base) MCG/ACT inhaler TAKE 2 PUFFS BY MOUTH EVERY 6 HOURS AS NEEDED FOR WHEEZE OR SHORTNESS OF BREATH 18 g 1    atorvastatin (LIPITOR) 20 MG tablet TAKE 1 TABLET BY MOUTH EVERY DAY 90 tablet 1   benzonatate (TESSALON) 100 MG capsule TAKE 1 CAPSULE BY MOUTH 2 TIMES DAILY AS NEEDED FOR COUGH. 20 capsule 1   bisoprolol (ZEBETA) 5 MG tablet TAKE 1 TABLET BY MOUTH EVERY DAY 90 tablet 1   busPIRone ([name]) 5 MG tablet TAKE 1 TABLET BY MOUTH TWICE A DAY 180 tablet 3   cholecalciferol (VITAMIN D) 1000 UNITS tablet Take 1,000 Units by mouth daily.     Cyanocobalamin (VITAMIN B-12 PO) Take 1 tablet by mouth daily.     hydrocortisone (ANUSOL-HC) 2.5 % rectal cream Place 1 application rectally at bedtime. 30 g 0   ketoconazole (NIZORAL) 2 % cream Apply 1 application topically daily. 15 g 0   levETIRAcetam (KEPPRA) 1000 MG tablet TAKE 1 TABLET BY MOUTH TWICE A DAY 180 tablet 1   lidocaine (XYLOCAINE) 5 % ointment Apply 1 application topically as needed for moderate pain (related to shingles). 240 g 0   loperamide (IMODIUM) 2 MG capsule Take 2 mg by mouth daily as needed for diarrhea or loose stools.      NONFORMULARY OR COMPOUNDED ITEM Antifungal solution: Terbinafine 3%, Fluconazole 2%, Tea Tree Oil 5%, Urea 10%, Ibuprofen 2% in DMSO suspension #6mL 1 each 3   omeprazole (PRILOSEC) 20 MG capsule Take 20 mg by mouth daily as needed.      QUEtiapine (SEROQUEL) 25 MG tablet Take 1/2 tablet in morning and 1 tablet at night 45 tablet 5   sertraline (ZOLOFT) 50 MG tablet TAKE 1 TABLET BY MOUTH EVERY DAY 90 tablet 1   triamcinolone cream (KENALOG) 0.1 % Apply 1 application topically 2 (two) times daily. For 7-10 days maximum 80 g 0   XARELTO 15 MG TABS tablet TAKE 1 TABLET BY MOUTH EVERY DAY WITH SUPPER 30 tablet 10   No facility-administered medications prior to visit.    Allergies  Allergen Reactions   Ativan [Lorazepam] Other (See Comments)    Severe hallucinations    Gabapentin     encephalopathy   Propoxyphene Hcl Nausea And Vomiting   Codeine Itching   Penicillins Rash    Has patient had a PCN reaction  causing immediate rash, facial/tongue/throat swelling, SOB or lightheadedness with hypotension: Yes Has patient had a PCN reaction causing severe rash involving mucus membranes or skin necrosis: Unk Has patient had a PCN reaction that required hospitalization: No Has patient had a PCN reaction occurring within the last 10 years: Yes If all of the above answers are "NO", then may proceed with Cephalosporin use.    Vancomycin Rash        Objective:     Physical Exam Vitals and nursing note reviewed.  Constitutional:      General: She is not in acute distress.    Appearance: Normal appearance.  HENT:     Head: Normocephalic. Video only assessment, Right eye appears with mild lower lid injection, conjunctival. No scleral injection noted. Pulmonary:     Effort: No respiratory distress.  Musculoskeletal:     Cervical back: Normal range of motion.  Skin:    General: Skin is dry.     Coloration: Skin is not pale.  Neurological:     Mental Status: She is alert and oriented to person, place, and time.  Psychiatric:        Mood and Affect: Mood normal.   Ht 5\' 4"  (1.626 m)   Wt 168 lb 10.4 oz (76.5 kg)   LMP  (LMP Unknown)   BMI 28.95 kg/m   Wt Readings from Last 3 Encounters:  06/13/21 168 lb 10.4 oz (76.5 kg)  06/09/21 168 lb 9.6 oz (76.5 kg)  05/06/21 170 lb (77.1 kg)       Assessment & Plan:   Problem List Items Addressed This Visit       Other   Acute conjunctivitis of right eye - Primary   Relevant Medications   trimethoprim-polymyxin b (POLYTRIM) ophthalmic solution    Meds ordered this encounter  Medications   DISCONTD: bacitracin-polymyxin b (POLYSPORIN) ophthalmic ointment    Sig: Place 1 application into the right eye every 12 (twelve) hours. apply to eye every 12 hours while awake    Dispense:  3.5 g    Refill:  0    Order Specific Question:   Supervising Provider    Answer:   ANDY, CAMILLE L [[id number]]   trimethoprim-polymyxin b (POLYTRIM) ophthalmic  solution    Sig: Place 1 drop into the right eye every 4 (four) hours.    Dispense:  10 mL    Refill:  0    Order Specific Question:   Supervising Provider    Answer:   ANDY, CAMILLE L [[id number]]    I discussed the assessment and treatment plan with the patient. The patient was provided an opportunity to ask questions and all were answered. The patient agreed with the plan and demonstrated an understanding of the instructions.   The patient was advised to call back or seek an in-person evaluation if the symptoms worsen or if the condition fails to improve as anticipated.  I provided 20 minutes of face-to-face time during this encounter.   Jeanie Sewer, NP [place] [phone] (phone) [phone] (fax)  [place]

## 2021-06-13 NOTE — Telephone Encounter (Signed)
Daughter called back and made an appt for [facility].

## 2021-06-13 NOTE — Telephone Encounter (Signed)
Pt's daughter called wanting an antibiotic called in for [facility] for pink eye. I informed Cyril Mourning that Dr Yong Channel is currently out of the office and [facility] would need an appt. I offered an in person or virtual appt and Kristen denied. Cyril Mourning then stated that she will be calling Dr Elease Hashimoto to tell him that she has pink and needs medication instead of Ava. Please Advise.

## 2021-06-13 NOTE — Telephone Encounter (Signed)
error 

## 2021-06-23 ENCOUNTER — Other Ambulatory Visit: Payer: Self-pay | Admitting: Family Medicine

## 2021-06-29 ENCOUNTER — Other Ambulatory Visit: Payer: Self-pay | Admitting: Family Medicine

## 2021-06-29 ENCOUNTER — Other Ambulatory Visit: Payer: Self-pay | Admitting: Neurology

## 2021-07-07 ENCOUNTER — Ambulatory Visit: Payer: Medicare Other | Admitting: Podiatry

## 2021-07-26 ENCOUNTER — Other Ambulatory Visit: Payer: Self-pay | Admitting: Neurology

## 2021-07-29 ENCOUNTER — Encounter: Payer: Self-pay | Admitting: Physician Assistant

## 2021-07-29 ENCOUNTER — Other Ambulatory Visit: Payer: Self-pay

## 2021-07-29 ENCOUNTER — Ambulatory Visit (INDEPENDENT_AMBULATORY_CARE_PROVIDER_SITE_OTHER): Payer: Medicare Other | Admitting: Physician Assistant

## 2021-07-29 VITALS — BP 108/60 | HR 80 | Temp 98.3°F | Ht 64.0 in | Wt 176.6 lb

## 2021-07-29 DIAGNOSIS — R053 Chronic cough: Secondary | ICD-10-CM

## 2021-07-29 DIAGNOSIS — J449 Chronic obstructive pulmonary disease, unspecified: Secondary | ICD-10-CM

## 2021-07-29 MED ORDER — TIOTROPIUM BROMIDE-OLODATEROL 2.5-2.5 MCG/ACT IN AERS: 2.0000 | INHALATION_SPRAY | Freq: Every day | RESPIRATORY_TRACT | 1 refills | Status: DC

## 2021-07-29 NOTE — Progress Notes (Signed)
[name] is a 87 y.o. female here for ongoing cough.  History of Present Illness:   Chief Complaint  Patient presents with   Cough    Pt c/o productive cough that won't go away X 3 months, medication (tessalon) not working;     HPI  Chronic Cough  [name] presents with c/o ongoing cough that has been onset for several months. She has seen Dr. Yong Channel about this issue on 05/06/21 due to cough intensifying as well as the production of clear phlegm in the mornings. During this time, [name] was found to be slightly wheezing and reported she had dry mouth despite having a wet cough. Pt does have a hx of COPD and was not using albuterol 108 mcg base inhaler at that time.   Following this visit, a CXR was completed and showed "COPD without evidence of active cardiopulmonary disease."  She is currently using albuterol twice daily. She and her husband state that this is scheduled. We reviewed all medications and she has no other prescribed inhalers. She was prescribed benzonatate but this has not been helpful.  She has seen pulmonary as recently as 2018. Was on oxygen at that time but has not been on since 2020.  She is on mucinex but cannot tell me if she is taking this regularly.  Denies current CP or SOB.   Past Medical History:  Diagnosis Date   ABDOMINAL INCISIONAL HERNIA 04/03/2010       Anxiety    Arthritis    "lots; hands" (05/26/2013)   Blood transfusion    "w/1st miscarriage" (05/26/2013)   COPD (chronic obstructive pulmonary disease) ([place])    Coronary artery disease    Depression    Diverticulitis of colon with hemorrhage 07/24/2008   DVT (deep venous thrombosis) ([place]) 1954   "after I had my first child" (05/26/2013)   Exertional shortness of breath    Family history of anesthesia complication    "sisters also get sick as a dog" (05/26/2013)   GERD 05/03/2007       GERD (gastroesophageal reflux disease)    H/O hiatal hernia    HCAP (healthcare-associated  pneumonia) 06/26/2013   Heart murmur    "when I was a child" (05/26/2013)   History of TIAs    Hyperlipidemia    Hypertension    Keratotic plaque    Laryngeal carcinoma (HCC) hx of ca   remotely with resection and xrt/chronic hoarsemenss   Lymphona, mantle cell, inguinal region/lower limb ([place])    "large c-cell" (05/26/2013)   OSTEOARTHRITIS 05/03/2007   PEPTIC ULCER DISEASE 07/06/2008   Pneumonia 2013   "once" (05/26/2013)   PUD (peptic ulcer disease)    PVD (peripheral vascular disease) ([place])    Seizures ([place])    Stroke ([place]) 05/26/2013   "very mild; affected my speech for a while" (05/26/2013)   VARICOSE VEINS LOWER EXTREMITIES W/INFLAMMATION 01/15/2009     Social History   Tobacco Use   Smoking status: Former    Packs/day: 2.50    Years: 37.00    Pack years: 92.50    Types: Cigarettes    Quit date: 09/05/1984    Years since quitting: 36.9   Smokeless tobacco: Never   Tobacco comments:    quit when she was dx with cancer  Vaping Use   Vaping Use: Never used  Substance Use Topics   Alcohol use: No    Alcohol/week: 0.0 standard drinks   Drug use: No    Past Surgical History:  Procedure Laterality Date   ABDOMINAL EXPLORATION SURGERY  12/01/2012   APPENDECTOMY  09/13/2008   CAROTID ENDARTERECTOMY Bilateral 2004   2004 a month apart right and left   CARPAL TUNNEL RELEASE Left ?1960's   CATARACT EXTRACTION  2005   bilateral   COLON SURGERY  09/13/2008   Laparoscopic assisted converted to open sigmoid colectomy.Archie Endo 09/13/2008 (05/26/2013)   DILATION AND CURETTAGE OF UTERUS  1950's   "had 2 for miscarriages" (05/26/2013)   DIVERTING ILEOSTOMY  09/22/2008   iliostomy for diverticulitis/notes 09/22/2008 (05/26/2013)   HERNIA REPAIR  05/2010   ventral hernia repair   ILEOSTOMY CLOSURE  01/2010   RADICAL NECK DISSECTION  1986   "larynx cancer" (05/26/2013)   TONSILLECTOMY AND ADENOIDECTOMY  1936   "tonsils grew back; no 2nd OR" (05/26/2013)   TOTAL KNEE ARTHROPLASTY  Bilateral    2002 left 2008 right   TUBAL LIGATION  1972    Family History  Problem Relation Age of Onset   Colon cancer Sister        colon   Hyperlipidemia Mother    Stroke Mother    Cancer Father        mesothelioma    Allergies  Allergen Reactions   Ativan [Lorazepam] Other (See Comments)    Severe hallucinations    Gabapentin     encephalopathy   Propoxyphene Hcl Nausea And Vomiting   Codeine Itching   Penicillins Rash    Has patient had a PCN reaction causing immediate rash, facial/tongue/throat swelling, SOB or lightheadedness with hypotension: Yes Has patient had a PCN reaction causing severe rash involving mucus membranes or skin necrosis: Unk Has patient had a PCN reaction that required hospitalization: No Has patient had a PCN reaction occurring within the last 10 years: Yes If all of the above answers are "NO", then may proceed with Cephalosporin use.    Vancomycin Rash    Current Medications:   Current Outpatient Medications:    albuterol (VENTOLIN HFA) 108 (90 Base) MCG/ACT inhaler, TAKE 2 PUFFS BY MOUTH EVERY 6 HOURS AS NEEDED FOR WHEEZE OR SHORTNESS OF BREATH, Disp: 18 each, Rfl: 1   atorvastatin (LIPITOR) 20 MG tablet, TAKE 1 TABLET BY MOUTH EVERY DAY, Disp: 90 tablet, Rfl: 1   bisoprolol (ZEBETA) 5 MG tablet, TAKE 1 TABLET BY MOUTH EVERY DAY, Disp: 90 tablet, Rfl: 1   busPIRone ([name]) 5 MG tablet, TAKE 1 TABLET BY MOUTH TWICE A DAY, Disp: 180 tablet, Rfl: 3   cholecalciferol (VITAMIN D) 1000 UNITS tablet, Take 1,000 Units by mouth daily., Disp: , Rfl:    Cyanocobalamin (VITAMIN B-12 PO), Take 1 tablet by mouth daily., Disp: , Rfl:    hydrocortisone (ANUSOL-HC) 2.5 % rectal cream, Place 1 application rectally at bedtime., Disp: 30 g, Rfl: 0   levETIRAcetam (KEPPRA) 1000 MG tablet, TAKE 1 TABLET BY MOUTH TWICE A DAY, Disp: 180 tablet, Rfl: 1   loperamide (IMODIUM) 2 MG capsule, Take 2 mg by mouth daily as needed for diarrhea or loose stools. , Disp: ,  Rfl:    NONFORMULARY OR COMPOUNDED ITEM, Antifungal solution: Terbinafine 3%, Fluconazole 2%, Tea Tree Oil 5%, Urea 10%, Ibuprofen 2% in DMSO suspension #59mL, Disp: 1 each, Rfl: 3   omeprazole (PRILOSEC) 20 MG capsule, Take 20 mg by mouth daily as needed. , Disp: , Rfl:    QUEtiapine (SEROQUEL) 25 MG tablet, 1 TABLET BEDTIME, Disp: 30 tablet, Rfl: 1   sertraline (ZOLOFT) 50 MG tablet, TAKE 1 TABLET BY MOUTH EVERY  DAY, Disp: 90 tablet, Rfl: 1   Tiotropium Bromide-Olodaterol 2.5-2.5 MCG/ACT AERS, Inhale 2 puffs into the lungs daily., Disp: 4 g, Rfl: 1   trimethoprim-polymyxin b (POLYTRIM) ophthalmic solution, Place 1 drop into the right eye every 4 (four) hours., Disp: 10 mL, Rfl: 0   XARELTO 15 MG TABS tablet, TAKE 1 TABLET BY MOUTH EVERY DAY WITH SUPPER, Disp: 30 tablet, Rfl: 10   benzonatate (TESSALON) 100 MG capsule, TAKE 1 CAPSULE BY MOUTH 2 TIMES DAILY AS NEEDED FOR COUGH (Patient not taking: Reported on 07/29/2021), Disp: 20 capsule, Rfl: 1   ketoconazole (NIZORAL) 2 % cream, Apply 1 application topically daily. (Patient not taking: Reported on 07/29/2021), Disp: 15 g, Rfl: 0   lidocaine (XYLOCAINE) 5 % ointment, Apply 1 application topically as needed for moderate pain (related to shingles). (Patient not taking: Reported on 07/29/2021), Disp: 240 g, Rfl: 0   triamcinolone cream (KENALOG) 0.1 %, Apply 1 application topically 2 (two) times daily. For 7-10 days maximum (Patient not taking: Reported on 07/29/2021), Disp: 80 g, Rfl: 0   Review of Systems:   ROS Negative unless otherwise specified per HPI. Vitals:   Vitals:   07/29/21 1559 07/29/21 1630  BP: (!) 82/58 108/60  Pulse: 80   Temp: 98.3 F (36.8 C)   TempSrc: Temporal   SpO2: 95%   Weight: 176 lb 9.6 oz (80.1 kg)   Height: 5\' 4"  (1.626 m)      Body mass index is 30.31 kg/m.  Physical Exam:   Physical Exam Vitals and nursing note reviewed.  Constitutional:      General: She is not in acute distress.    Appearance:  She is well-developed. She is not ill-appearing or toxic-appearing.  Cardiovascular:     Rate and Rhythm: Normal rate and regular rhythm.     Pulses: Normal pulses.     Heart sounds: Normal heart sounds, S1 normal and S2 normal.  Pulmonary:     Effort: Pulmonary effort is normal.     Breath sounds: Normal breath sounds.  Skin:    General: Skin is warm and dry.  Neurological:     Mental Status: She is alert.     GCS: GCS eye subscore is 4. GCS verbal subscore is 5. GCS motor subscore is 6.  Psychiatric:        Speech: Speech normal.        Behavior: Behavior normal. Behavior is cooperative.    Assessment and Plan:   Chronic Cough Uncontrolled; no obvious red flags on exam Suspect ongoing uncontrolled COPD Discussed starting maintenance inhaler -- she was on Stiolto in the past -- we will re-start this. Discussed as follows and provided in AVS: Start new inhaler -- STIOLTO -- TWO PUFFS IN LUNGS DAILY Continue old inhaler -- ALBUTEROL -- TWO PUFFS every 6 HOURS as NEEDED Continue mucinex TWICE DAILY -- generic is fine; stay hydrated I am putting in a referral for you to see a lung doctor to follow-up on this  [name] know if you get any worse or have new symptoms.   I,Havlyn C Ratchford,acting as a Education administrator for [employer], PA.,have documented all relevant documentation on the behalf of Inda Coke, PA,as directed by  Inda Coke, PA while in the presence of Inda Coke, [place].  I, Inda Coke, [place], have reviewed all documentation for this visit. The documentation on 07/29/21 for the exam, diagnosis, procedures, and orders are all accurate and complete.   Inda Coke, PA-C

## 2021-07-29 NOTE — Patient Instructions (Signed)
It was great to see you!  Keep an eye on your blood pressure -- if it remains low (<100/60) please call us!!  Drink at least 64 oz of water a day  Continue mucinex TWICE DAILY -- generic is fine    Start new inhaler -- STIOLTO -- TWO PUFFS IN LUNGS DAILY Continue old inhaler -- ALBUTEROL -- TWO PUFFS every 6 HOURS as NEEDED  I am putting in a referral for you to see a lung doctor to follow-up on this  Let us know if you get any worse or have new symptoms.  If a referral was placed today, you will be contacted for an appointment. Please note that routine referrals can sometimes take up to 3-4 weeks to process. Please call our office if you haven't heard anything after this time frame.  Take care,  Inda Coke PA-C

## 2021-07-30 ENCOUNTER — Other Ambulatory Visit: Payer: Self-pay | Admitting: Family Medicine

## 2021-08-07 ENCOUNTER — Telehealth: Payer: Self-pay

## 2021-08-07 ENCOUNTER — Ambulatory Visit (INDEPENDENT_AMBULATORY_CARE_PROVIDER_SITE_OTHER): Payer: Medicare Other | Admitting: Family Medicine

## 2021-08-07 ENCOUNTER — Telehealth: Payer: Self-pay | Admitting: Neurology

## 2021-08-07 ENCOUNTER — Encounter: Payer: Self-pay | Admitting: Family Medicine

## 2021-08-07 ENCOUNTER — Other Ambulatory Visit: Payer: Self-pay

## 2021-08-07 VITALS — BP 117/72 | HR 72 | Temp 98.2°F | Ht 64.0 in | Wt 176.0 lb

## 2021-08-07 DIAGNOSIS — R630 Anorexia: Secondary | ICD-10-CM

## 2021-08-07 DIAGNOSIS — U071 COVID-19: Secondary | ICD-10-CM | POA: Diagnosis not present

## 2021-08-07 DIAGNOSIS — I4891 Unspecified atrial fibrillation: Secondary | ICD-10-CM

## 2021-08-07 DIAGNOSIS — F3342 Major depressive disorder, recurrent, in full remission: Secondary | ICD-10-CM

## 2021-08-07 DIAGNOSIS — E785 Hyperlipidemia, unspecified: Secondary | ICD-10-CM

## 2021-08-07 DIAGNOSIS — F0390 Unspecified dementia without behavioral disturbance: Secondary | ICD-10-CM | POA: Diagnosis not present

## 2021-08-07 DIAGNOSIS — J449 Chronic obstructive pulmonary disease, unspecified: Secondary | ICD-10-CM

## 2021-08-07 DIAGNOSIS — R410 Disorientation, unspecified: Secondary | ICD-10-CM

## 2021-08-07 LAB — POC COVID19 BINAXNOW: SARS Coronavirus 2 Ag: POSITIVE — AB

## 2021-08-07 MED ORDER — MOLNUPIRAVIR EUA 200MG CAPSULE: 4.0000 | ORAL_CAPSULE | Freq: Two times a day (BID) | ORAL | 0 refills | Status: AC

## 2021-08-07 NOTE — Progress Notes (Signed)
Phone [phone] In person visit   Subjective:   [name] is a 88 y.o. year old very pleasant female patient who presents for/with See problem oriented charting No chief complaint on file.   This visit occurred during the SARS-CoV-2 public health emergency.  Safety protocols were in place, including screening questions prior to the visit, additional usage of staff PPE, and extensive cleaning of exam room while observing appropriate contact time as indicated for disinfecting solutions.   Past Medical History-  Patient Active Problem List   Diagnosis Date Noted   Major neurocognitive disorder ([place]) 02/24/2021    Priority: High   Memory loss 12/14/2019    Priority: High   Diastolic CHF ([place]) 24/82/5003    Priority: High   History of cardioembolic stroke 70/48/8891    Priority: High   Spinal stenosis, lumbar region, with neurogenic claudication 05/09/2014    Priority: High   History of Focal seizure ([place]) 06/16/2013    Priority: High   History of TIA (transient ischemic attack) 05/31/2013    Priority: High   History of Large B-cell lymphoma  02/18/2012    Priority: High   Atrial fibrillation ([place]) 12/14/2008    Priority: High   Mild aortic stenosis 08/26/2020    Priority: Medium    Recurrent major depressive disorder, in full remission ([place]) 10/16/2019    Priority: Medium    Agitation 03/11/2018    Priority: Medium    History of chronic respiratory failure- prior on o2 06/15/2017    Priority: Medium    Bacteremia due to Streptococcus 11/21/2015    Priority: Medium    Cellulitis of leg, right 11/20/2015    Priority: Medium    Diverticulitis of colon 09/26/2014    Priority: Medium    CKD (chronic kidney disease), stage III ([place]) 09/26/2014    Priority: Medium    Laryngeal carcinoma ([place])     Priority: Medium    Anxiety state 10/20/2008    Priority: Medium    Depression 10/20/2008    Priority: Medium    Dyspnea on exertion 07/06/2008    Priority: Medium     COPD GOLD II 03/26/2008    Priority: Medium    GERD (gastroesophageal reflux disease) 05/03/2007    Priority: Medium    Hyperlipidemia 03/18/2007    Priority: Medium    Essential hypertension 03/18/2007    Priority: Medium    Loose stools 06/01/2019    Priority: Low   Hemorrhoids 06/01/2019    Priority: Low   Chronic diarrhea 09/04/2017    Priority: Low   Aortic atherosclerosis ([place]) 06/30/2016    Priority: Low   Weakness generalized 06/30/2013    Priority: Low   Bilateral hand pain 01/09/2013    Priority: Low   Acute conjunctivitis of right eye 06/13/2021    Medications- reviewed and updated Current Outpatient Medications  Medication Sig Dispense Refill   molnupiravir EUA (LAGEVRIO) 200 mg CAPS capsule Take 4 capsules (800 mg total) by mouth 2 (two) times daily for 5 days. 40 capsule 0   albuterol (VENTOLIN HFA) 108 (90 Base) MCG/ACT inhaler TAKE 2 PUFFS BY MOUTH EVERY 6 HOURS AS NEEDED FOR WHEEZE OR SHORTNESS OF BREATH 18 each 1   atorvastatin (LIPITOR) 20 MG tablet TAKE 1 TABLET BY MOUTH EVERY DAY 90 tablet 1   benzonatate (TESSALON) 100 MG capsule TAKE 1 CAPSULE BY MOUTH 2 TIMES DAILY AS NEEDED FOR COUGH (Patient not taking: Reported on 07/29/2021) 20 capsule 1   bisoprolol (ZEBETA) 5  MG tablet TAKE 1 TABLET BY MOUTH EVERY DAY 90 tablet 1   busPIRone ([name]) 5 MG tablet TAKE 1 TABLET BY MOUTH TWICE A DAY 180 tablet 3   cholecalciferol (VITAMIN D) 1000 UNITS tablet Take 1,000 Units by mouth daily.     Cyanocobalamin (VITAMIN B-12 PO) Take 1 tablet by mouth daily.     hydrocortisone (ANUSOL-HC) 2.5 % rectal cream Place 1 application rectally at bedtime. 30 g 0   ketoconazole (NIZORAL) 2 % cream Apply 1 application topically daily. (Patient not taking: Reported on 07/29/2021) 15 g 0   levETIRAcetam (KEPPRA) 1000 MG tablet TAKE 1 TABLET BY MOUTH TWICE A DAY 180 tablet 1   lidocaine (XYLOCAINE) 5 % ointment Apply 1 application topically as needed for moderate pain (related to  shingles). (Patient not taking: Reported on 07/29/2021) 240 g 0   loperamide (IMODIUM) 2 MG capsule Take 2 mg by mouth daily as needed for diarrhea or loose stools.      NONFORMULARY OR COMPOUNDED ITEM Antifungal solution: Terbinafine 3%, Fluconazole 2%, Tea Tree Oil 5%, Urea 10%, Ibuprofen 2% in DMSO suspension #50m 1 each 3   omeprazole (PRILOSEC) 20 MG capsule Take 20 mg by mouth daily as needed.      QUEtiapine (SEROQUEL) 25 MG tablet 1 TABLET BEDTIME 30 tablet 1   sertraline (ZOLOFT) 50 MG tablet TAKE 1 TABLET BY MOUTH EVERY DAY 90 tablet 1   Tiotropium Bromide-Olodaterol 2.5-2.5 MCG/ACT AERS Inhale 2 puffs into the lungs daily. 4 g 1   triamcinolone cream (KENALOG) 0.1 % Apply 1 application topically 2 (two) times daily. For 7-10 days maximum (Patient not taking: Reported on 07/29/2021) 80 g 0   trimethoprim-polymyxin b (POLYTRIM) ophthalmic solution Place 1 drop into the right eye every 4 (four) hours. 10 mL 0   XARELTO 15 MG TABS tablet TAKE 1 TABLET BY MOUTH EVERY DAY WITH SUPPER 30 tablet 10   No current facility-administered medications for this visit.     Objective:  BP 117/72    Pulse 72    Temp 98.2 F (36.8 C)    Ht 5' 4" (1.626 m)    Wt 176 lb (79.8 kg)    LMP  (LMP Unknown)    SpO2 (!) 72%    BMI 30.21 kg/m  Gen: NAD, resting comfortably- CV: Regular heart rate-no murmurs rubs or gallops Lungs: CTAB-prolonged expiratory phase.  No crackles noted or wheezing Ext: no edema Skin: warm, dry Neuro: appears to me to be at approximately baseline level of confusion    Assessment and Plan   # Loss in Appetite (ultimately related to covid-19) S: Patient's daughter reports that her mother has not been eating or drinking in the last 2 1/2 days. No fever. Has cough but no worse than normal. Far less responsive when talked to, does not want to get washed and dressed. Not eating well and not drinking. Seems more confused. Has had loose stools. Can stand up but doesn't seem as strong.  Sleeping much more  - Also has contacted Dr. JGeorgie Chardoffice today reporting patient has gone "downhill" - not eating, drinking, does not remember who her husband is, and has become quite hostile towards people. Dr. PPosey Prontorecommended that patient first be evaluated for any type of infection (UTI, URI) today - if results negative plan will be to increase seroquel to 50 mg BID to help with behavio/agitation -had 2 covid shots but had declined further - family member with blood clot issue  -  no increased SOB A/P: 88 year old female with major neurocognitive disorder with loss of appetite/fatigue/less interactive/more confusion for the last 2 and half days testing positive for COVID today. - Patient with baseline COPD and dyspnea on exertion and typically oxygen levels dropped in the office even without COVID and require oxygen supplementation.  Her oxygen saturation is at 80%-I recommended proceeding to the emergency room in light of COVID/current symptoms/low oxygen saturations-patient and family declined for now based on her typical baseline-they do have access to oxygen at home and there are plans to place her on 2 L of oxygen and agree if she does not improve to 88% (we also discussed not pushing for high oxygen levels due to potential to suppress respiratory drive) to seek care in the emergency room or if she clinically deteriorates-to actually states she looks the best she has in last several days-despite low oxygen level she did not appear in respiratory distress - We opted to treat with molnupiravir - Family also wanted her checked for UTI Discussed COVID can cause the symptoms-she was given a kit to collect urine at home tomorrow and bring by the office -She continues to have baseline chronic cough that is productive over the last 3 months-has been seen in late December for this-for acute change occurred 3 days ago so we do believe she is still within the interval to start molnupiravir -At that  time-Chest x-ray was completed and showed COPD without evidence of active cardiopulmonary disease. -She may continue albuterol for her baseline COPD-not wheezing on exam today and we opted to hold off on steroids at this time -Continue Stiolto inhaler -Doubt pulmonary embolism with chronic Xarelto for A. fib in regards to low oxygen saturation -Once again formal recommendation was for going to emergency room and patient declined-she was able to state that the risk of delay could potentially lead to death related to low oxygen -Does have heart failure but does not appear fluid overloaded-no edema though cannot fully assess weight gain-patient felt most comfortable remaining in her vehicle and we opted for this for safety measures as well  Recommended follow up: No follow-ups on file. Future Appointments  Date Time Provider [place]  08/12/2021  4:15 PM Gardiner Barefoot, DPM [facility]  01/13/2022  2:30 PM Pieter Partridge, DO [facility] None  04/10/2022  3:15 PM [facility] CO[facility]    Lab/Order associations:   ICD-10-CM   1. COVID-19  U07.1     2. Appetite loss  R63.0 POC COVID-19    CANCELED: Novel Coronavirus, NAA ([facility])    3. Major neurocognitive disorder ([place])  F03.90     4. Atrial fibrillation, unspecified type ([place])  I48.91     5. COPD GOLD II  J44.9     6. Recurrent major depressive disorder, in full remission ([place])  F33.42     7. Hyperlipidemia, unspecified hyperlipidemia type  E78.5     8. Confusion  R41.0 Urine Culture    POCT Urinalysis Dipstick (Automated)      Meds ordered this encounter  Medications   molnupiravir EUA (LAGEVRIO) 200 mg CAPS capsule    Sig: Take 4 capsules (800 mg total) by mouth 2 (two) times daily for 5 days.    Dispense:  40 capsule    Refill:  0    Time Spent: 42 minutes of total time (4:30 PM- 5:12 PM) was spent on the date of the encounter performing the following actions: chart review prior to seeing the  patient,  obtaining history, performing a medically necessary exam, counseling on the treatment plan, placing orders, and documenting in our EHR.   -We coordinated care and evaluated patient in her vehicle to minimize risk to other patients and staff with active COVID  I,Harris Phan,acting as a scribe for [name], MD.,have documented all relevant documentation on the behalf of [name], MD,as directed by  [name], MD while in the presence of [name], MD.   I, [name], MD, have reviewed all documentation for this visit. The documentation on 08/07/21 for the exam, diagnosis, procedures, and orders are all accurate and complete.    Return precautions advised.  [name], MD

## 2021-08-07 NOTE — Telephone Encounter (Signed)
Patient's daughter states that patient is refusing ED.  They have tried several times along with EMS.  I have scheduled patient for virtual visit at 4pm.

## 2021-08-07 NOTE — Telephone Encounter (Signed)
Pt's daughter called in stating her mother has gone downhill. She is refusing to eat or drink, cannot remember who her husband is, and is hostile to people. The daughter is not sure what the next step should be with her dementia.

## 2021-08-07 NOTE — Telephone Encounter (Signed)
I am confused-we discussed doing this in person so that we could do COVID and flu testing as well as blood work at 4 PM.  Is she refusing in person as well?

## 2021-08-07 NOTE — Patient Instructions (Signed)
Health Maintenance Due  Topic Date Due   Zoster Vaccines- Shingrix (1 of 2) Never done   DEXA SCAN  Never done   TETANUS/TDAP  01/26/2021    Recommended follow up: No follow-ups on file.

## 2021-08-07 NOTE — Telephone Encounter (Signed)
Pt is coming in office.

## 2021-08-07 NOTE — Telephone Encounter (Signed)
Pt daughter advised of Dr.Patel's recommendation.   Per Daughter patient has a virtual visit with her PCP.  And she will get back with [place]. And her sister Carlene come by the office and pick up resources packet.

## 2021-08-07 NOTE — Telephone Encounter (Signed)
Believes patient in the late stages of dementia.  States she is unable to get patient wash and dress due to patient not wanting too.    States patient has stopped eating and drinking for the last 2 1/2 days.  States patient did have a sip of tea yesterday.  States patient has almost stopped taking medication.  Has been able to get patient to take some pills.  Is requesting a virtual work in with Chauncey or a call back with what he suggest next steps are.

## 2021-08-07 NOTE — Telephone Encounter (Signed)
See below, ok to schedule virtual work in?

## 2021-08-07 NOTE — Telephone Encounter (Signed)
Recommend that the patient first be evaluated for any type of infection (UTI, URI) by PCP to be sure there is nothing making her dementia symptoms worse.  If negative, then her quetiapine can be increased to 50mg  twice daily which may help with behavior.

## 2021-08-08 ENCOUNTER — Other Ambulatory Visit: Payer: Self-pay | Admitting: Family Medicine

## 2021-08-09 ENCOUNTER — Encounter (HOSPITAL_COMMUNITY): Payer: Self-pay

## 2021-08-09 ENCOUNTER — Emergency Department (HOSPITAL_COMMUNITY): Payer: Medicare Other

## 2021-08-09 ENCOUNTER — Other Ambulatory Visit: Payer: Self-pay

## 2021-08-09 ENCOUNTER — Observation Stay (HOSPITAL_COMMUNITY)
Admission: EM | Admit: 2021-08-09 | Discharge: 2021-08-10 | Discharge status: Against Medical Advice | Payer: Medicare Other | Attending: Internal Medicine | Admitting: Internal Medicine

## 2021-08-09 DIAGNOSIS — R051 Acute cough: Secondary | ICD-10-CM | POA: Diagnosis present

## 2021-08-09 DIAGNOSIS — F32A Depression, unspecified: Secondary | ICD-10-CM

## 2021-08-09 DIAGNOSIS — N183 Chronic kidney disease, stage 3 unspecified: Secondary | ICD-10-CM | POA: Diagnosis not present

## 2021-08-09 DIAGNOSIS — N1832 Chronic kidney disease, stage 3b: Secondary | ICD-10-CM | POA: Diagnosis not present

## 2021-08-09 DIAGNOSIS — I480 Paroxysmal atrial fibrillation: Secondary | ICD-10-CM | POA: Diagnosis not present

## 2021-08-09 DIAGNOSIS — E785 Hyperlipidemia, unspecified: Secondary | ICD-10-CM | POA: Diagnosis present

## 2021-08-09 DIAGNOSIS — J189 Pneumonia, unspecified organism: Secondary | ICD-10-CM | POA: Diagnosis present

## 2021-08-09 DIAGNOSIS — F329 Major depressive disorder, single episode, unspecified: Secondary | ICD-10-CM | POA: Diagnosis not present

## 2021-08-09 DIAGNOSIS — N1831 Chronic kidney disease, stage 3a: Secondary | ICD-10-CM | POA: Diagnosis present

## 2021-08-09 DIAGNOSIS — Z515 Encounter for palliative care: Secondary | ICD-10-CM | POA: Insufficient documentation

## 2021-08-09 DIAGNOSIS — U071 COVID-19: Principal | ICD-10-CM | POA: Diagnosis present

## 2021-08-09 DIAGNOSIS — J1282 Pneumonia due to coronavirus disease 2019: Secondary | ICD-10-CM | POA: Insufficient documentation

## 2021-08-09 DIAGNOSIS — J9601 Acute respiratory failure with hypoxia: Secondary | ICD-10-CM | POA: Diagnosis not present

## 2021-08-09 DIAGNOSIS — J9611 Chronic respiratory failure with hypoxia: Secondary | ICD-10-CM | POA: Insufficient documentation

## 2021-08-09 DIAGNOSIS — J9621 Acute and chronic respiratory failure with hypoxia: Secondary | ICD-10-CM | POA: Diagnosis present

## 2021-08-09 DIAGNOSIS — Z79899 Other long term (current) drug therapy: Secondary | ICD-10-CM | POA: Diagnosis not present

## 2021-08-09 DIAGNOSIS — F03918 Unspecified dementia, unspecified severity, with other behavioral disturbance: Secondary | ICD-10-CM | POA: Diagnosis present

## 2021-08-09 DIAGNOSIS — R0602 Shortness of breath: Secondary | ICD-10-CM | POA: Diagnosis present

## 2021-08-09 DIAGNOSIS — M6281 Muscle weakness (generalized): Secondary | ICD-10-CM | POA: Diagnosis not present

## 2021-08-09 DIAGNOSIS — F0390 Unspecified dementia without behavioral disturbance: Secondary | ICD-10-CM | POA: Diagnosis not present

## 2021-08-09 LAB — COMPREHENSIVE METABOLIC PANEL
ALT: 10 U/L (ref 0–44)
AST: 16 U/L (ref 15–41)
Albumin: 3.3 g/dL — ABNORMAL LOW (ref 3.5–5.0)
Alkaline Phosphatase: 62 U/L (ref 38–126)
Anion gap: 9 (ref 5–15)
BUN: 21 mg/dL (ref 8–23)
CO2: 25 mmol/L (ref 22–32)
Calcium: 9.1 mg/dL (ref 8.9–10.3)
Chloride: 103 mmol/L (ref 98–111)
Creatinine, Ser: 1.14 mg/dL — ABNORMAL HIGH (ref 0.44–1.00)
GFR, Estimated: 45 mL/min — ABNORMAL LOW (ref >60)
Glucose, Bld: 108 mg/dL — ABNORMAL HIGH (ref 70–99)
Potassium: 4 mmol/L (ref 3.5–5.1)
Sodium: 137 mmol/L (ref 135–145)
Total Bilirubin: 0.6 mg/dL (ref 0.3–1.2)
Total Protein: 7.4 g/dL (ref 6.5–8.1)

## 2021-08-09 LAB — CBC WITH DIFFERENTIAL/PLATELET
Abs Immature Granulocytes: 0.01 10*3/uL (ref 0.00–0.07)
Basophils Absolute: 0 10*3/uL (ref 0.0–0.1)
Basophils Relative: 0 %
Eosinophils Absolute: 0 10*3/uL (ref 0.0–0.5)
Eosinophils Relative: 0 %
HCT: 39.1 % (ref 36.0–46.0)
Hemoglobin: 11.9 g/dL — ABNORMAL LOW (ref 12.0–15.0)
Immature Granulocytes: 0 %
Lymphocytes Relative: 21 %
Lymphs Abs: 1 10*3/uL (ref 0.7–4.0)
MCH: 27.4 pg (ref 26.0–34.0)
MCHC: 30.4 g/dL (ref 30.0–36.0)
MCV: 90.1 fL (ref 80.0–100.0)
Monocytes Absolute: 0.6 10*3/uL (ref 0.1–1.0)
Monocytes Relative: 12 %
Neutro Abs: 3.2 10*3/uL (ref 1.7–7.7)
Neutrophils Relative %: 67 %
Platelets: 157 10*3/uL (ref 150–400)
RBC: 4.34 MIL/uL (ref 3.87–5.11)
RDW: 14.2 % (ref 11.5–15.5)
WBC: 4.8 10*3/uL (ref 4.0–10.5)
nRBC: 0 % (ref 0.0–0.2)

## 2021-08-09 LAB — C-REACTIVE PROTEIN: CRP: 6.5 mg/dL — ABNORMAL HIGH (ref <1.0)

## 2021-08-09 LAB — I-STAT VENOUS BLOOD GAS, ED
Acid-Base Excess: 1 mmol/L (ref 0.0–2.0)
Bicarbonate: 25.6 mmol/L (ref 20.0–28.0)
Calcium, Ion: 1.09 mmol/L — ABNORMAL LOW (ref 1.15–1.40)
HCT: 37 % (ref 36.0–46.0)
Hemoglobin: 12.6 g/dL (ref 12.0–15.0)
O2 Saturation: 98 %
Potassium: 4 mmol/L (ref 3.5–5.1)
Sodium: 138 mmol/L (ref 135–145)
TCO2: 27 mmol/L (ref 22–32)
pCO2, Ven: 40.7 mmHg — ABNORMAL LOW (ref 44.0–60.0)
pH, Ven: 7.407 (ref 7.250–7.430)
pO2, Ven: 112 mmHg — ABNORMAL HIGH (ref 32.0–45.0)

## 2021-08-09 LAB — LACTATE DEHYDROGENASE: LDH: 151 U/L (ref 98–192)

## 2021-08-09 LAB — RESP PANEL BY RT-PCR (FLU A&B, COVID) ARPGX2
Influenza A by PCR: NEGATIVE
Influenza B by PCR: NEGATIVE
SARS Coronavirus 2 by RT PCR: POSITIVE — AB

## 2021-08-09 LAB — HEPATITIS B SURFACE ANTIGEN: Hepatitis B Surface Ag: NONREACTIVE

## 2021-08-09 LAB — PHOSPHORUS: Phosphorus: 2.9 mg/dL (ref 2.5–4.6)

## 2021-08-09 LAB — FERRITIN: Ferritin: 132 ng/mL (ref 11–307)

## 2021-08-09 LAB — MAGNESIUM: Magnesium: 1.6 mg/dL — ABNORMAL LOW (ref 1.7–2.4)

## 2021-08-09 LAB — BRAIN NATRIURETIC PEPTIDE: B Natriuretic Peptide: 259 pg/mL — ABNORMAL HIGH (ref 0.0–100.0)

## 2021-08-09 LAB — LACTIC ACID, PLASMA: Lactic Acid, Venous: 0.7 mmol/L (ref 0.5–1.9)

## 2021-08-09 LAB — D-DIMER, QUANTITATIVE: D-Dimer, Quant: 1.28 ug/mL-FEU — ABNORMAL HIGH (ref 0.00–0.50)

## 2021-08-09 MED ORDER — RIVAROXABAN 15 MG PO TABS
15.0000 mg | ORAL_TABLET | Freq: Every day | ORAL | Status: DC
  Filled 2021-08-09 (×2): qty 1

## 2021-08-09 MED ORDER — GUAIFENESIN-DM 100-10 MG/5ML PO SYRP: 10.0000 mL | ORAL_SOLUTION | ORAL | Status: DC | PRN

## 2021-08-09 MED ORDER — BISOPROLOL FUMARATE 5 MG PO TABS
5.0000 mg | ORAL_TABLET | Freq: Every day | ORAL | Status: DC
  Administered 2021-08-10: 5 mg via ORAL
  Filled 2021-08-09 (×2): qty 1

## 2021-08-09 MED ORDER — ONDANSETRON HCL 4 MG/2ML IJ SOLN: 4.0000 mg | Freq: Four times a day (QID) | INTRAMUSCULAR | Status: DC | PRN

## 2021-08-09 MED ORDER — LEVETIRACETAM 500 MG PO TABS
1000.0000 mg | ORAL_TABLET | Freq: Two times a day (BID) | ORAL | Status: DC
Start: 2021-08-09 — End: 2021-08-10
  Administered 2021-08-10 (×2): 1000 mg via ORAL
  Filled 2021-08-09 (×2): qty 2

## 2021-08-09 MED ORDER — BUSPIRONE HCL 10 MG PO TABS
5.0000 mg | ORAL_TABLET | Freq: Two times a day (BID) | ORAL | Status: DC
Start: 2021-08-09 — End: 2021-08-10
  Administered 2021-08-10 (×2): 5 mg via ORAL
  Filled 2021-08-09: qty 1

## 2021-08-09 MED ORDER — HALOPERIDOL LACTATE 5 MG/ML IJ SOLN
2.0000 mg | Freq: Once | INTRAMUSCULAR | Status: AC
  Administered 2021-08-09: 2 mg via INTRAVENOUS
  Filled 2021-08-09: qty 1

## 2021-08-09 MED ORDER — HALOPERIDOL LACTATE 5 MG/ML IJ SOLN
2.0000 mg | Freq: Once | INTRAMUSCULAR | Status: AC
  Administered 2021-08-09: 2 mg via INTRAMUSCULAR
  Filled 2021-08-09: qty 1

## 2021-08-09 MED ORDER — LEVOFLOXACIN IN D5W 750 MG/150ML IV SOLN
750.0000 mg | Freq: Once | INTRAVENOUS | Status: DC
Start: 2021-08-09 — End: 2021-08-09

## 2021-08-09 MED ORDER — LOPERAMIDE HCL 2 MG PO CAPS: 2.0000 mg | ORAL_CAPSULE | Freq: Every day | ORAL | Status: DC | PRN

## 2021-08-09 MED ORDER — ASCORBIC ACID 500 MG PO TABS
500.0000 mg | ORAL_TABLET | Freq: Every day | ORAL | Status: DC
  Administered 2021-08-10: 500 mg via ORAL
  Filled 2021-08-09: qty 1

## 2021-08-09 MED ORDER — ZINC SULFATE 220 (50 ZN) MG PO CAPS
220.0000 mg | ORAL_CAPSULE | Freq: Every day | ORAL | Status: DC
  Administered 2021-08-10: 220 mg via ORAL
  Filled 2021-08-09 (×2): qty 1

## 2021-08-09 MED ORDER — ONDANSETRON HCL 4 MG PO TABS: 4.0000 mg | ORAL_TABLET | Freq: Four times a day (QID) | ORAL | Status: DC | PRN

## 2021-08-09 MED ORDER — SODIUM CHLORIDE 0.9 % IV SOLN
2.0000 g | INTRAVENOUS | Status: DC
  Administered 2021-08-09: 2 g via INTRAVENOUS
  Filled 2021-08-09: qty 20

## 2021-08-09 MED ORDER — DEXAMETHASONE SODIUM PHOSPHATE 10 MG/ML IJ SOLN
6.0000 mg | INTRAMUSCULAR | Status: DC
  Administered 2021-08-09 – 2021-08-10 (×2): 6 mg via INTRAVENOUS
  Filled 2021-08-09 (×2): qty 1

## 2021-08-09 MED ORDER — ACETAMINOPHEN 325 MG PO TABS: 650.0000 mg | ORAL_TABLET | Freq: Four times a day (QID) | ORAL | Status: DC | PRN

## 2021-08-09 MED ORDER — MOLNUPIRAVIR EUA 200MG CAPSULE
4.0000 | ORAL_CAPSULE | Freq: Two times a day (BID) | ORAL | Status: DC
  Administered 2021-08-10: 800 mg via ORAL
  Filled 2021-08-09 (×2): qty 4

## 2021-08-09 MED ORDER — QUETIAPINE FUMARATE 25 MG PO TABS
25.0000 mg | ORAL_TABLET | Freq: Once | ORAL | Status: AC
  Administered 2021-08-10: 25 mg via ORAL
  Filled 2021-08-09: qty 1

## 2021-08-09 MED ORDER — SODIUM CHLORIDE 0.9% FLUSH
3.0000 mL | Freq: Two times a day (BID) | INTRAVENOUS | Status: DC
  Administered 2021-08-09: 3 mL via INTRAVENOUS

## 2021-08-09 MED ORDER — IPRATROPIUM-ALBUTEROL 20-100 MCG/ACT IN AERS
1.0000 | INHALATION_SPRAY | Freq: Four times a day (QID) | RESPIRATORY_TRACT | Status: DC
  Filled 2021-08-09: qty 4

## 2021-08-09 MED ORDER — ATORVASTATIN CALCIUM 10 MG PO TABS
20.0000 mg | ORAL_TABLET | Freq: Every day | ORAL | Status: DC
  Administered 2021-08-10: 20 mg via ORAL
  Filled 2021-08-09 (×2): qty 2

## 2021-08-09 MED ORDER — SERTRALINE HCL 50 MG PO TABS
50.0000 mg | ORAL_TABLET | Freq: Every day | ORAL | Status: DC
  Administered 2021-08-10: 50 mg via ORAL
  Filled 2021-08-09 (×2): qty 1

## 2021-08-09 MED ORDER — SODIUM CHLORIDE 0.9 % IV SOLN
100.0000 mg | Freq: Two times a day (BID) | INTRAVENOUS | Status: DC
  Administered 2021-08-09 – 2021-08-10 (×2): 100 mg via INTRAVENOUS
  Filled 2021-08-09 (×2): qty 100

## 2021-08-09 NOTE — ED Notes (Signed)
Vitals are unable to be updated at this time due to combativeness in pt

## 2021-08-09 NOTE — ED Notes (Signed)
This RN attempted to obtain IV access twice. Pt unable to remain still or refrain from pulling arm back. Attempts were unsuccessful. Will have another RN attempt.

## 2021-08-09 NOTE — ED Triage Notes (Signed)
Per spouse patient was diagnosed with covid yesterday and will not take covid meds or daily meds and will not drink. Patient alert but only rolls eyes when I ask her why not taking meds. Patient arrived to triage on oxygen
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
## 2021-08-09 NOTE — ED Provider Notes (Signed)
[facility] EMERGENCY DEPARTMENT Provider Note   CSN: [id number] Arrival date & time: 08/09/21  1045     History  No chief complaint on file.   [name] is a 88 y.o. female.  HPI Patient presents with her husband who assists with history.  Patient presents with ongoing cough.  Patient is a recalcitrant historian, details obtained by her, someone, and her husband and chart review.  Patient was diagnosed with COVID yesterday, was provided prescription for antiviral, but has not been taking this.  She presents today at the behest of her daughter due to ongoing cough, unwilling to take medication.  Per triage the patient was hypoxic, though not with increased work of breathing on initial exam.  Patient denies any specific complaints that she does not notice cough after repeat questioning.  According to family patient was otherwise well prior to the onset of illness.  Aside from age she does not seemingly have substantial medical issues.    Home Medications Prior to Admission medications   Medication Sig Start Date End Date Taking? Authorizing Provider  albuterol (VENTOLIN HFA) 108 (90 Base) MCG/ACT inhaler TAKE 2 PUFFS BY MOUTH EVERY 6 HOURS AS NEEDED FOR WHEEZE OR SHORTNESS OF BREATH 06/23/21  Yes Marin Olp, MD  atorvastatin (LIPITOR) 20 MG tablet TAKE 1 TABLET BY MOUTH EVERY DAY 05/13/21  Yes Marin Olp, MD  bisoprolol (ZEBETA) 5 MG tablet TAKE 1 TABLET BY MOUTH EVERY DAY 03/05/21  Yes Marin Olp, MD  busPIRone ([name]) 5 MG tablet TAKE 1 TABLET BY MOUTH TWICE A DAY 12/09/20  Yes Marin Olp, MD  cholecalciferol (VITAMIN D) 1000 UNITS tablet Take 1,000 Units by mouth daily.   Yes [provider]  Cyanocobalamin (VITAMIN B-12 PO) Take 1 tablet by mouth daily.   Yes [provider]  levETIRAcetam (KEPPRA) 1000 MG tablet TAKE 1 TABLET BY MOUTH TWICE A DAY 07/30/21  Yes Marin Olp, MD  loperamide (IMODIUM) 2 MG capsule  Take 2 mg by mouth daily as needed for diarrhea or loose stools.    Yes [provider]  molnupiravir EUA (LAGEVRIO) 200 mg CAPS capsule Take 4 capsules (800 mg total) by mouth 2 (two) times daily for 5 days. 08/07/21 08/12/21 Yes Marin Olp, MD  QUEtiapine (SEROQUEL) 25 MG tablet 1 TABLET BEDTIME 07/01/21  Yes Jaffe, Adam R, DO  sertraline (ZOLOFT) 50 MG tablet TAKE 1 TABLET BY MOUTH EVERY DAY 06/10/21  Yes Tomi Likens, Adam R, DO  Tiotropium Bromide-Olodaterol 2.5-2.5 MCG/ACT AERS Inhale 2 puffs into the lungs daily. 07/29/21  Yes Worley, Aldona Bar, PA  XARELTO 15 MG TABS tablet TAKE 1 TABLET BY MOUTH EVERY DAY WITH SUPPER 08/08/21  Yes Marin Olp, MD  benzonatate (TESSALON) 100 MG capsule TAKE 1 CAPSULE BY MOUTH 2 TIMES DAILY AS NEEDED FOR COUGH Patient not taking: Reported on 07/29/2021 06/30/21   Marin Olp, MD  hydrocortisone (ANUSOL-HC) 2.5 % rectal cream Place 1 application rectally at bedtime. Patient not taking: Reported on 08/09/2021 04/12/20   Marin Olp, MD  ketoconazole (NIZORAL) 2 % cream Apply 1 application topically daily. Patient not taking: Reported on 07/29/2021 04/15/20   Marin Olp, MD  lidocaine (XYLOCAINE) 5 % ointment Apply 1 application topically as needed for moderate pain (related to shingles). Patient not taking: Reported on 07/29/2021 10/30/20   Marin Olp, MD  NONFORMULARY OR COMPOUNDED ITEM Antifungal solution: Terbinafine 3%, Fluconazole 2%, Tea Tree Oil 5%, Urea  10%, Ibuprofen 2% in DMSO suspension #108mL Patient not taking: Reported on 08/09/2021 12/22/19   Marzetta Board, DPM  triamcinolone cream (KENALOG) 0.1 % Apply 1 application topically 2 (two) times daily. For 7-10 days maximum Patient not taking: Reported on 07/29/2021 02/24/21   Marin Olp, MD  trimethoprim-polymyxin b Mayra Neer) ophthalmic solution Place 1 drop into the right eye every 4 (four) hours. Patient not taking: Reported on 08/09/2021 06/13/21   Jeanie Sewer, NP      Allergies    Ativan [lorazepam], Gabapentin, Propoxyphene hcl, Codeine, Penicillins, and Vancomycin    Review of Systems   Review of Systems  All other systems reviewed and are negative.  Physical Exam Updated Vital Signs BP (!) 150/83    Pulse 95    Temp 99.6 F (37.6 C)    Resp (!) 26    LMP  (LMP Unknown)    SpO2 97%  Physical Exam Vitals and nursing note reviewed.  Constitutional:      Appearance: She is well-developed. She is ill-appearing.     Comments: Ill-appearing adult female with active cough  HENT:     Head: Normocephalic and atraumatic.  Eyes:     Conjunctiva/sclera: Conjunctivae normal.  Cardiovascular:     Rate and Rhythm: Normal rate and regular rhythm.  Pulmonary:     Effort: Pulmonary effort is normal. No respiratory distress.     Breath sounds: Normal breath sounds. No stridor.  Abdominal:     General: There is no distension.  Skin:    General: Skin is warm and dry.  Neurological:     Mental Status: She is alert.     Cranial Nerves: No cranial nerve deficit.     Comments: Moves all extremities spontaneously, does not follow commands consistently, though that is unclear if this is from unwillingness to do so or inability.  Psychiatric:     Comments: Withdrawn historian    ED Results / Procedures / Treatments   Labs (all labs ordered are listed, but only abnormal results are displayed) Labs Reviewed  RESP PANEL BY RT-PCR (FLU A&B, COVID) ARPGX2 - Abnormal; Notable for the following components:      Result Value   SARS Coronavirus 2 by RT PCR POSITIVE (*)    All other components within normal limits  CBC WITH DIFFERENTIAL/PLATELET - Abnormal; Notable for the following components:   Hemoglobin 11.9 (*)    All other components within normal limits  COMPREHENSIVE METABOLIC PANEL - Abnormal; Notable for the following components:   Glucose, Bld 108 (*)    Creatinine, Ser 1.14 (*)    Albumin 3.3 (*)    GFR, Estimated 45 (*)    All  other components within normal limits  D-DIMER, QUANTITATIVE - Abnormal; Notable for the following components:   D-Dimer, Quant 1.28 (*)    All other components within normal limits  BRAIN NATRIURETIC PEPTIDE - Abnormal; Notable for the following components:   B Natriuretic Peptide 259.0 (*)    All other components within normal limits  MAGNESIUM - Abnormal; Notable for the following components:   Magnesium 1.6 (*)    All other components within normal limits  I-STAT VENOUS BLOOD GAS, ED - Abnormal; Notable for the following components:   pCO2, Ven 40.7 (*)    pO2, Ven 112.0 (*)    Calcium, Ion 1.09 (*)    All other components within normal limits  LACTIC ACID, PLASMA  LACTATE DEHYDROGENASE  PHOSPHORUS  FERRITIN  C-REACTIVE PROTEIN  HEPATITIS B SURFACE ANTIGEN  PROCALCITONIN  COMPREHENSIVE METABOLIC PANEL  CBC WITH DIFFERENTIAL/PLATELET  PHOSPHORUS  MAGNESIUM    EKG EKG Interpretation  Date/Time:  Saturday August 09 2021 17:07:43 EST Ventricular Rate:  80 PR Interval:  190 QRS Duration: 85 QT Interval:  392 QTC Calculation: 453 R Axis:   70 Text Interpretation: Sinus rhythm Probable anteroseptal infarct, old Baseline wander Artifact Abnormal ECG Confirmed by Carmin Muskrat [phone]) on 08/09/2021 8:15:49 PM  Radiology DG Chest Portable 1 View  Result Date: 08/09/2021 CLINICAL DATA:  Pt complains of shortness of breath. Patient alert but not talking, history provided by husband who was present in the room. He states that same began 2 days ago, was COVID-positive at primary care yesterday. Patient arrives satting 67% on 2L, up to 95% on 6L. Husband states she has been refusing to eat or drink or take her medication for the past 2 days. According to husband she is normally alert and oriented and speaking normally.Hx of COPD, CAD, DVT, HCAP (2014), Heart murmur, HTN, lymphoma, laryngeal carcinoma, former smoker EXAM: PORTABLE CHEST 1 VIEW COMPARISON:  05/07/2021. FINDINGS:  Decreased opacity at the lung bases, most evident on the right. Lungs are hyperexpanded with chronically prominent markings. No convincing pleural effusion.  No pneumothorax. Cardiac silhouette top-normal in size. No mediastinal or hilar masses. Skeletal structures are demineralized, but grossly intact. IMPRESSION: 1. Increased opacity at the lung bases, most evident on the right. Suspect right lower lobe pneumonia. No evidence of pulmonary edema. Electronically Signed   By: Lajean Manes M.D.   On: 08/09/2021 13:34    Procedures Procedures    Medications Ordered in ED Medications  Rivaroxaban (XARELTO) tablet 15 mg (15 mg Oral Patient Refused/Not Given 08/09/21 1750)  sodium chloride flush (NS) 0.9 % injection 3 mL (0 mLs Intravenous Hold 08/09/21 1735)  Ipratropium-Albuterol (COMBIVENT) respimat 1 puff (1 puff Inhalation Patient Refused/Not Given 08/09/21 1749)  guaiFENesin-dextromethorphan (ROBITUSSIN DM) 100-10 MG/5ML syrup 10 mL (has no administration in time range)  ascorbic acid (VITAMIN C) tablet 500 mg (500 mg Oral Patient Refused/Not Given 08/09/21 1749)  zinc sulfate capsule 220 mg (220 mg Oral Patient Refused/Not Given 08/09/21 1749)  ondansetron (ZOFRAN) tablet 4 mg (has no administration in time range)    Or  ondansetron (ZOFRAN) injection 4 mg (has no administration in time range)  acetaminophen (TYLENOL) tablet 650 mg (has no administration in time range)  cefTRIAXone (ROCEPHIN) 2 g in sodium chloride 0.9 % 100 mL IVPB (0 g Intravenous Stopped 08/09/21 1804)  dexamethasone (DECADRON) injection 6 mg (6 mg Intravenous Given 08/09/21 1742)  molnupiravir EUA (LAGEVRIO) capsule 800 mg (has no administration in time range)  levETIRAcetam (KEPPRA) tablet 1,000 mg (has no administration in time range)  loperamide (IMODIUM) capsule 2 mg (has no administration in time range)  sertraline (ZOLOFT) tablet 50 mg (50 mg Oral Patient Refused/Not Given 08/09/21 1749)  busPIRone ([name]) tablet 5 mg (has no  administration in time range)  bisoprolol (ZEBETA) tablet 5 mg (5 mg Oral Patient Refused/Not Given 08/09/21 1749)  atorvastatin (LIPITOR) tablet 20 mg (20 mg Oral Patient Refused/Not Given 08/09/21 1750)  doxycycline (VIBRAMYCIN) 100 mg in sodium chloride 0.9 % 250 mL IVPB (100 mg Intravenous New Bag/Given 08/09/21 1800)  haloperidol lactate (HALDOL) injection 2 mg (2 mg Intramuscular Given 08/09/21 1635)  haloperidol lactate (HALDOL) injection 2 mg (2 mg Intravenous Given 08/09/21 1826)    ED Course/ Medical Decision Making/ A&P Pulse ox 92% with 2 L via nasal  cannula abnormal                          8:15 PM Patient accompanied by daughter. Medical Decision Making  I have reviewed the x-ray, agree with the interpretation, which I have done independently, concern for pneumonia, likely complicated the patient's known COVID infection.  With new hypoxia patient has started supplemental oxygen, will receive antibiotics given penicillin allergy this will be Levaquin.  Other considerations include bacteremia, sepsis not supported, but considered. PE similarly considered, but the patient is on Xarelto, has no chest pain, this remains lower on the differential.  After conversation with multiple family members to encourage the patient to stay, she is eventually amenable to this. EKG nonischemic, consistent the patient's denial of chest pain, low suspicion for concurrent ACS.        Final Clinical Impression(s) / ED Diagnoses Final diagnoses:  COVID     Carmin Muskrat, MD 08/09/21 2016

## 2021-08-09 NOTE — ED Notes (Signed)
Patient refusing xray at this time. Vanita Panda, MD aware.

## 2021-08-09 NOTE — ED Provider Triage Note (Signed)
Emergency Medicine Provider Triage Evaluation Note  [name] , a 88 y.o. female  was evaluated in triage.  Pt complains of shortness of breath.  Patient alert but not talking, history provided by husband who was present in the room.  He states that same began 2 days ago, was COVID-positive at primary care yesterday.  Patient arrives satting 67% on 2L, up to 95% on 6L.  Husband states she has been refusing to eat or drink or take her medication for the past 2 days.  According to husband she is normally alert and oriented and speaking normally.  Review of Systems  Positive:  Negative: See above  Physical Exam  BP 122/64    Pulse 86    Temp 99.6 F (37.6 C)    Resp 18    LMP  (LMP Unknown)    SpO2 92%  Gen:   Awake, no distress   Resp:  Normal effort  MSK:   Moves extremities without difficulty  Other:    Medical Decision Making  Medically screening exam initiated at 11:29 AM.  Appropriate orders placed.  DESARAI BARRACK was informed that the remainder of the evaluation will be completed by another provider, this initial triage assessment does not replace that evaluation, and the importance of remaining in the ED until their evaluation is complete.     Bud Face, PA-C 08/09/21 1133
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
## 2021-08-09 NOTE — ED Notes (Signed)
Bed alarm will be placed once pt is agreeable to rolling to either side to place underneath. Family remains at the bedside.

## 2021-08-09 NOTE — ED Notes (Signed)
Daughter-Carlene gave her phone number [phone] in case there were any questions regarding pt.

## 2021-08-09 NOTE — ED Notes (Signed)
O2 increased to 6L in triage by PA, O2 on 2L was 67%

## 2021-08-09 NOTE — ED Notes (Signed)
After approximately 15 minutes, able to get one arm out of clothing to attempt IV

## 2021-08-09 NOTE — ED Notes (Signed)
EKG attempt took approximately 10 minutes with assistance from pts husband, Ludwig Clarks

## 2021-08-09 NOTE — ED Notes (Signed)
Patient refusing vitals, ecg, and swab at this time. Patient also refusing to get into bed in the room. Patient remains in wheelchair at this time.

## 2021-08-10 ENCOUNTER — Inpatient Hospital Stay (HOSPITAL_COMMUNITY): Payer: Medicare Other

## 2021-08-10 DIAGNOSIS — Z515 Encounter for palliative care: Secondary | ICD-10-CM

## 2021-08-10 DIAGNOSIS — F03918 Unspecified dementia, unspecified severity, with other behavioral disturbance: Secondary | ICD-10-CM | POA: Diagnosis not present

## 2021-08-10 DIAGNOSIS — J9601 Acute respiratory failure with hypoxia: Secondary | ICD-10-CM | POA: Diagnosis not present

## 2021-08-10 DIAGNOSIS — N1832 Chronic kidney disease, stage 3b: Secondary | ICD-10-CM | POA: Diagnosis not present

## 2021-08-10 DIAGNOSIS — U071 COVID-19: Secondary | ICD-10-CM | POA: Diagnosis not present

## 2021-08-10 DIAGNOSIS — Z7189 Other specified counseling: Secondary | ICD-10-CM

## 2021-08-10 LAB — COMPREHENSIVE METABOLIC PANEL
ALT: 12 U/L (ref 0–44)
AST: 19 U/L (ref 15–41)
Albumin: 3.1 g/dL — ABNORMAL LOW (ref 3.5–5.0)
Alkaline Phosphatase: 64 U/L (ref 38–126)
Anion gap: 10 (ref 5–15)
BUN: 20 mg/dL (ref 8–23)
CO2: 22 mmol/L (ref 22–32)
Calcium: 8.9 mg/dL (ref 8.9–10.3)
Chloride: 105 mmol/L (ref 98–111)
Creatinine, Ser: 0.98 mg/dL (ref 0.44–1.00)
GFR, Estimated: 54 mL/min — ABNORMAL LOW (ref >60)
Glucose, Bld: 137 mg/dL — ABNORMAL HIGH (ref 70–99)
Potassium: 4.3 mmol/L (ref 3.5–5.1)
Sodium: 137 mmol/L (ref 135–145)
Total Bilirubin: 0.6 mg/dL (ref 0.3–1.2)
Total Protein: 7.2 g/dL (ref 6.5–8.1)

## 2021-08-10 LAB — CBC WITH DIFFERENTIAL/PLATELET
Abs Immature Granulocytes: 0.01 10*3/uL (ref 0.00–0.07)
Basophils Absolute: 0 10*3/uL (ref 0.0–0.1)
Basophils Relative: 0 %
Eosinophils Absolute: 0 10*3/uL (ref 0.0–0.5)
Eosinophils Relative: 0 %
HCT: 40.5 % (ref 36.0–46.0)
Hemoglobin: 12.5 g/dL (ref 12.0–15.0)
Immature Granulocytes: 0 %
Lymphocytes Relative: 17 %
Lymphs Abs: 0.6 10*3/uL — ABNORMAL LOW (ref 0.7–4.0)
MCH: 27.4 pg (ref 26.0–34.0)
MCHC: 30.9 g/dL (ref 30.0–36.0)
MCV: 88.8 fL (ref 80.0–100.0)
Monocytes Absolute: 0.1 10*3/uL (ref 0.1–1.0)
Monocytes Relative: 4 %
Neutro Abs: 2.7 10*3/uL (ref 1.7–7.7)
Neutrophils Relative %: 79 %
Platelets: 154 10*3/uL (ref 150–400)
RBC: 4.56 MIL/uL (ref 3.87–5.11)
RDW: 13.8 % (ref 11.5–15.5)
WBC: 3.5 10*3/uL — ABNORMAL LOW (ref 4.0–10.5)
nRBC: 0 % (ref 0.0–0.2)

## 2021-08-10 LAB — MAGNESIUM: Magnesium: 1.6 mg/dL — ABNORMAL LOW (ref 1.7–2.4)

## 2021-08-10 LAB — PHOSPHORUS: Phosphorus: 4.4 mg/dL (ref 2.5–4.6)

## 2021-08-10 LAB — PROCALCITONIN: Procalcitonin: <0.1 ng/mL

## 2021-08-10 MED ORDER — BENZONATATE 100 MG PO CAPS: 100.0000 mg | ORAL_CAPSULE | Freq: Two times a day (BID) | ORAL | 0 refills | Status: DC | PRN

## 2021-08-10 MED ORDER — MAGNESIUM SULFATE 2 GM/50ML IV SOLN
2.0000 g | Freq: Once | INTRAVENOUS | Status: AC
  Administered 2021-08-10: 2 g via INTRAVENOUS
  Filled 2021-08-10: qty 50

## 2021-08-10 MED ORDER — METHYLPREDNISOLONE 4 MG PO TBPK: ORAL_TABLET | ORAL | 0 refills | Status: DC

## 2021-08-10 MED ORDER — HALOPERIDOL LACTATE 5 MG/ML IJ SOLN: 2.0000 mg | Freq: Four times a day (QID) | INTRAMUSCULAR | Status: DC | PRN

## 2021-08-10 MED ORDER — AZITHROMYCIN 250 MG PO TABS: 250.0000 mg | ORAL_TABLET | Freq: Every day | ORAL | 0 refills | Status: DC

## 2021-08-10 MED ORDER — QUETIAPINE FUMARATE 25 MG PO TABS
25.0000 mg | ORAL_TABLET | Freq: Every day | ORAL | Status: DC
  Administered 2021-08-10: 25 mg via ORAL
  Filled 2021-08-10: qty 1

## 2021-08-10 MED ORDER — AZITHROMYCIN 250 MG PO TABS
250.0000 mg | ORAL_TABLET | Freq: Every day | ORAL | Status: DC
  Administered 2021-08-10: 250 mg via ORAL
  Filled 2021-08-10: qty 1

## 2021-08-10 NOTE — Discharge Instructions (Addendum)
Follow with Primary MD Marin Olp, MD in 7 days   Get CBC, CMP, 2 view Chest X ray -  checked next visit within 1 week by Primary MD    Activity: As tolerated with Full fall precautions use walker/cane & assistance as needed  Disposition Home   Diet: Soft with feeding assistance and aspiration precautions.    Special Instructions: If you have smoked or chewed Tobacco  in the last 2 yrs please stop smoking, stop any regular Alcohol  and or any Recreational drug use.  On your next visit with your primary care physician please Get Medicines reviewed and adjusted.  Please request your Prim.MD to go over all Hospital Tests and Procedure/Radiological results at the follow up, please get all Hospital records sent to your Prim MD by signing hospital release before you go home.  If you experience worsening of your admission symptoms, develop shortness of breath, life threatening emergency, suicidal or homicidal thoughts you must seek medical attention immediately by calling 911 or calling your MD immediately  if symptoms less severe.  You Must read complete instructions/literature along with all the possible adverse reactions/side effects for all the Medicines you take and that have been prescribed to you. Take any new Medicines after you have completely understood and accpet all the possible adverse reactions/side effects.     Medicare Outpatient Observation Notice   Patient name:  [name] Patient number:  [id number]                                                                                                                                                                       [facility] a hospital outpatient receiving observation services. You are not an inpatient because:    shortness of Breath   Patient status is changed from inpatient to observation (outpatient)status as indicated under the Medicare Condition Code-44 Regulations, Chapter 100-04 of the Medicare Claims  Processing Manual 50.3. Date of Status Change  Being an outpatient may affect what you pay in a hospital:   When youre a hospital outpatient, your observation stay is covered under Medicare Part B.   For Part B services, you generally pay:   A copayment for each outpatient hospital service you get. Part B copayments may vary by type of service.   20% of the Medicare-approved amount for most doctor services, after the Part B deductible.   Observation services may affect coverage and payment of your care after you leave the hospital:     If you need skilled nursing facility (SNF) care after you leave the hospital, Medicare Part A will only cover SNF care if youve had a 3-day minimum, medically necessary, inpatient hospital stay for a related illness or injury. An inpatient hospital stay begins the day the hospital admits you as an inpatient based on a doctors order and doesnt include the day youre discharged.   If you have Medicaid, a Medicare Advantage plan or other health plan, Medicaid or the plan may have different rules for SNF coverage after you leave the hospital. Check with Medicaid or your plan.   NOTE: Medicare Part A generally doesnt cover outpatient hospital services, like an observation stay. However, Part A will generally cover medically necessary inpatient services if the hospital admits you as an inpatient based on a doctors order. In most cases, youll pay a one-time deductible for all of your inpatient hospital services for the first 60 days youre in a hospital.                                                                                                                                                                      If you have any questions about your observation services, ask the hospital staff  member giving you this notice or the doctor providing your hospital care. You can also ask to speak with someone from the hospitals utilization or discharge planning department.   You can also call 1-800-MEDICARE (1-4708662905).  TT[employer] users should call [phone].   Form CMS 92119-ERDE   Expiration 08/02/2021 [employer] APPROVAL [id number]          Your costs for medications:     Generally, prescription and over-the-counter drugs, including self-administered drugs, you get in a hospital outpatient setting (like an emergency department) arent covered by Part B. Self- administered drugs are drugs youd normally take on your own. For safety reasons, many hospitals dont allow you to take medications brought from home. If you have a Medicare prescription drug plan (Part D), your plan may help you pay for these drugs. Youll likely need to pay out-of- pocket for these drugs and submit a claim to your drug plan for a refund. Contact your drug plan for  more information.                                                                                                                                                                        If youre enrolled in a Medicare Advantage plan (like an HMO or PPO) or other Medicare health plan (Part C), your costs and coverage may be different. Check with your plan to find out about coverage for outpatient observation services.    If youre a Chief of Staff through your state Medicaid program, you cant be billed for Part A or Part B deductibles, coinsurance, and copayments.                                                                                                                                                                      Additional Information (Optional):                                                                                                                                                                                 Please sign below to show you received and understand this notice.  Date: 08/10/21 / Time:11:50 AM   CMS does not discriminate in its programs and activities. To request this publication in alternative format, please call: 1-800-MEDICARE or email:[email or web address].   Form CMS [id number]   Expiration 08/02/2021 [employer] APPROVAL [id number]      Patient   Add No image attached Trace Slow Corrupt Edit Data Change Template Print On

## 2021-08-10 NOTE — Progress Notes (Signed)
SLP received swallow eval order and reviewed chart. Pt/family have opted to leave AMA at this time. ST service will sign off.  Berdell Nevitt P. Caylah Plouff, M.S., CCC-SLP 08/10/21 1148

## 2021-08-10 NOTE — ED Notes (Signed)
Received verbal report from Matt B RN at this time °

## 2021-08-10 NOTE — Consult Note (Signed)
Consultation Note Date: 08/10/2021   Patient Name: [name]  DOB: July 08, 1928  MRN: [id number]  Age / Sex: 88 y.o., female  PCP: Marin Olp, MD Referring Physician: Thurnell Lose, MD  Reason for Consultation: Establishing goals of care "seems to be end-stage dementia coming in with COVID and hypoxic respiratory failure with concern for aspiration"  HPI/Patient Profile: 88 y.o. female  with past medical history of dementia, CAD, atrial fibrillation on chronic anticoagulation, COPD, DVT, seizure disorder, laryngeal carcinoma s/p resection, and GERD. She presented to the emergency department on 08/09/2021 with cough and shortness of breath. She tested positive for COVID-19 on 1/6. Family reported that for the last several days, she had been unwilling to take her medications, refusing to eat, and was noted to have O2 saturations in the 70's at home. In the ED, patient was hypoxic as low as 67% on 2L nasal cannula with improvement to 93% on 3-4L. Chest x-ray showed concern for increased opacities of lung bases worse on the right with concern for pneumonia. Admitted to [facility].    Clinical Assessment and Goals of Care: I have reviewed medical records including EPIC notes, labs and imaging.   I met outside the patient's room in the ED with daughters Carlene and [name] to discuss diagnosis, prognosis, GOC, EOL wishes, disposition, and options.  They are in the process of leaving the hospital against medical advice. Patient has significant behavioral disturbance as a result of advanced dementia. She has been very agitated and refusing medications. Daughters feels she will do better in her home environment.   I introduced Palliative Medicine as specialized medical care for people living with serious illness. It focuses on providing relief from the symptoms and stress of a serious illness.   Patient lives at  home with her husband and daughter. Her husband is elderly, but in relatively fair health and has no cognitive impairment. As far as functional status, patient is ambulatory with a walker for short distances. She requires full assistance with ADLs with the exception she can feed herself.   We reviewed that dementia is a progressive, non-curable disease underlying the patient's current acute medical conditions. We reviewed specific indicators of end stage dementia, including inability to communicate, bed bound/non-ambulatory status, decreased oral intake, and incontinence of bowel/bladder.  The difference between full scope medical intervention and comfort care was considered.  I introduced the concept of a comfort path to family, emphasizing that this path involves de-escalating and stopping full scope medical interventions, allowing a natural course to occur. Discussed that the goal is comfort and dignity rather than cure/prolonging life.   Provided education and counseling at on the philosophy and benefits of hospice care. Discussed that it offers a holistic approach to care in the setting of end-stage illness/disease, and is about supporting the patient where they are while allowing the natural course to occur. Discussed that hospice can provide personal care, symptom management, support for the family, and help keep patient out of the hospital.  Advanced directives, concepts  specific to code status, artifical feeding and hydration, and rehospitalization were considered and discussed. Encouraged daughters to consider DNR/DNI status understanding evidenced based poor outcomes in similar hospitalized patients, as the cause of the arrest is likely associated with chronic/terminal disease rather than a reversible acute cardio-pulmonary event. I explained that DNR/DNI is a protective measure to keep [place] from harming the patient in their last moments of life. Daughters seem to indicate they agree that DNR is  appropriate but need to discuss with their father (patient's husband) prior to making that decision.  Daughters agree that hospice would be beneficial for their mother and wish to proceed with a referral.    Primary decision maker: Spouse/Edward and daughters Carlene and [name]    SUMMARY OF RECOMMENDATIONS   Daughters to discuss code status at home with their father Hospice referral   Psycho-social/Spiritual:  Created space and opportunity for family to express thoughts and feelings regarding patient's current medical situation.  Emotional support provided    Prognosis:  < 6 months  Discharge Planning: home      Primary Diagnoses: Present on Admission:  COVID-19  Hyperlipidemia  Acute respiratory failure with hypoxia (HCC)  Pneumonia  AF (paroxysmal atrial fibrillation) (HCC)  Depression  CKD (chronic kidney disease), stage III ([place])  Dementia with behavioral disturbance   I have reviewed the medical record, interviewed the patient and family, and examined the patient. The following aspects are pertinent.  Past Medical History:  Diagnosis Date   ABDOMINAL INCISIONAL HERNIA 04/03/2010       Anxiety    Arthritis    "lots; hands" (05/26/2013)   Blood transfusion    "w/1st miscarriage" (05/26/2013)   COPD (chronic obstructive pulmonary disease) (HCC)    Coronary artery disease    Depression    Diverticulitis of colon with hemorrhage 07/24/2008   DVT (deep venous thrombosis) ([place]) 1954   "after I had my first child" (05/26/2013)   Exertional shortness of breath    Family history of anesthesia complication    "sisters also get sick as a dog" (05/26/2013)   GERD 05/03/2007       GERD (gastroesophageal reflux disease)    H/O hiatal hernia    HCAP (healthcare-associated pneumonia) 06/26/2013   Heart murmur    "when I was a child" (05/26/2013)   History of TIAs    Hyperlipidemia    Hypertension    Keratotic plaque    Laryngeal carcinoma (HCC) hx of ca    remotely with resection and xrt/chronic hoarsemenss   Lymphona, mantle cell, inguinal region/lower limb ([place])    "large c-cell" (05/26/2013)   OSTEOARTHRITIS 05/03/2007   PEPTIC ULCER DISEASE 07/06/2008   Pneumonia 2013   "once" (05/26/2013)   PUD (peptic ulcer disease)    PVD (peripheral vascular disease) ([place])    Seizures ([place])    Stroke ([place]) 05/26/2013   "very mild; affected my speech for a while" (05/26/2013)   VARICOSE VEINS LOWER EXTREMITIES W/INFLAMMATION 01/15/2009    Scheduled Meds:  vitamin C  500 mg Oral Daily   atorvastatin  20 mg Oral Daily   azithromycin  250 mg Oral Daily   bisoprolol  5 mg Oral Daily   busPIRone  5 mg Oral BID   dexamethasone (DECADRON) injection  6 mg Intravenous Q24H   Ipratropium-Albuterol  1 puff Inhalation Q6H   levETIRAcetam  1,000 mg Oral BID   molnupiravir EUA  4 capsule Oral BID   QUEtiapine  25 mg Oral Daily   Rivaroxaban  15 mg Oral Q supper   sertraline  50 mg Oral Daily   sodium chloride flush  3 mL Intravenous Q12H   zinc sulfate  220 mg Oral Daily   Continuous Infusions: PRN Meds:.acetaminophen, guaiFENesin-dextromethorphan, haloperidol lactate, loperamide, ondansetron **OR** ondansetron (ZOFRAN) IV   Allergies  Allergen Reactions   Ativan [Lorazepam] Other (See Comments)    Severe hallucinations    Gabapentin     encephalopathy   Propoxyphene Hcl Nausea And Vomiting   Codeine Itching   Penicillins Rash    Has patient had a PCN reaction causing immediate rash, facial/tongue/throat swelling, SOB or lightheadedness with hypotension: Yes Has patient had a PCN reaction causing severe rash involving mucus membranes or skin necrosis: Unk Has patient had a PCN reaction that required hospitalization: No Has patient had a PCN reaction occurring within the last 10 years: Yes If all of the above answers are "NO", then may proceed with Cephalosporin use.    Vancomycin Rash   Review of Systems  Unable to perform ROS: Dementia    Physical Exam Vitals reviewed.  Constitutional:      General: She is not in acute distress.    Appearance: She is ill-appearing.  Pulmonary:     Effort: Pulmonary effort is normal.  Psychiatric:        Behavior: Behavior is agitated.        Cognition and Memory: Cognition is impaired.    Vital Signs: BP (!) 168/99    Pulse 89    Temp 99.6 F (37.6 C)    Resp 16    LMP  (LMP Unknown)    SpO2 94%  Pain Scale: 0-10   Pain Score: 0-No pain   SpO2: SpO2: 94 % O2 Device:SpO2: 94 % O2 Flow Rate: .O2 Flow Rate (L/min): 6 L/min      Palliative Assessment/Data: PPS 40%    MDM - High due to: 1 or more chronic illnesses with severe exacerbation, progression, or side effects of treatment OR acute or chronic illness or injury that poses a threat to life or bodily function Review of prior external notes, review of test results, assessment requiring an independent historian Discussion or decision not to resuscitate or to de-escalate care because poor prognosis   Signed by: Lavena Bullion, NP   Please contact Palliative Medicine Team phone at [phone] for questions and concerns.  For individual provider: See Shea Evans

## 2021-08-10 NOTE — ED Notes (Signed)
Pt had thick white and yellow secretions in her mouth attempted to suction but pt refused and wouldn't let me. Used a wash cloth to clean her mouth out and pt spit out multiple times thick secretions. Pt cleaned and changed and adjusted in bed at this time and placed back on monitor

## 2021-08-10 NOTE — Evaluation (Signed)
Physical Therapy Evaluation Patient Details Name: [name] MRN: [id number] DOB: 1928/01/01 Today's Date: 08/10/2021  History of Present Illness  Pt is a 88 y.o. female who presented 08/09/21 with cough and SOB. Pt with COVID pneumonia. PMH: dementia, CAD, atrial fibrillation on chronic anticoagulation, COPD, DVT, seizure disorder, laryngeal carcinoma s/p resection, and GERD   Clinical Impression  Pt presents with condition above and deficits mentioned below, see PT Problem List. PTA, she was requiring assistance for all functional mobility and ADLs, but she tends to only allow her husband to assist majority of the time. She also has a tendency to refuse to use her rollator and has a hx of falls. She lives with her daughter and husband on the main level of a 2-level house with 1 STE. Currently, she displays deficits in coordination, cognition, balance, strength, and activity tolerance. She is at high risk for falls, requiring minA to take only a couple steps today. Attempted to demonstrate how unsafe she is without the RW, but unsure whether pt will accept and comply to the recommendation to use her rollator at home vs no device. Pt's daughters report that they can assist her as needed at home, thus recommending HHPT follow-up and a 3-in-1 for increased safety and ease with bathroom access at d/c. Will continue to follow acutely if she does change her mind and decide to remain in the hospital as she is currently planning to leave AMA.     Recommendations for follow up therapy are one component of a multi-disciplinary discharge planning process, led by the attending physician.  Recommendations may be updated based on patient status, additional functional criteria and insurance authorization.  Follow Up Recommendations Home health PT    Assistance Recommended at Discharge Frequent or constant Supervision/Assistance  Patient can return home with the following  A lot of help with  bathing/dressing/bathroom;A little help with walking and/or transfers;Assistance with cooking/housework;Direct supervision/assist for medications management;Direct supervision/assist for financial management;Assist for transportation;Help with stairs or ramp for entrance    Equipment Recommendations BSC/3in1  Recommendations for Other Services       Functional Status Assessment Patient has had a recent decline in their functional status and demonstrates the ability to make significant improvements in function in a reasonable and predictable amount of time.     Precautions / Restrictions Precautions Precautions: Fall;Other (comment) Precaution Comments: monitor SpO2 Restrictions Weight Bearing Restrictions: No      Mobility  Bed Mobility Overal bed mobility: Needs Assistance Bed Mobility: Supine to Sit;Sit to Supine     Supine to sit: Mod assist;HOB elevated Sit to supine: Mod assist;HOB elevated   General bed mobility comments: ModA to elevate trunk and scoot hips to edge of stretcher. ModA to manage legs back onto bed and slight assist at trunk.    Transfers Overall transfer level: Needs assistance Equipment used: Rolling walker (2 wheels);None Transfers: Sit to/from Stand;Bed to chair/wheelchair/BSC Sit to Stand: Min assist;+2 safety/equipment   Step pivot transfers: Min assist       General transfer comment: MinA to power up to stand from stretcher or commode to RW. 1x to no UE support with pt resting posterior aspect of legs of stretcher for stability. MinA to steady with stand step to L stretcher > commode, cuing pt to keep hands on RW and bring with her.    Ambulation/Gait Ambulation/Gait assistance: Min assist;+2 safety/equipment Gait Distance (Feet): 3 Feet (x2 bouts of ~3 ft > ~2 ft) Assistive device: Rolling walker (2 wheels);None Gait  Pattern/deviations: Step-to pattern;Decreased stride length;Shuffle Gait velocity: reduced Gait velocity interpretation:  <1.31 ft/sec, indicative of household ambulator   General Gait Details: Pt with very slow, shuffling steps. Tremulous throughout her body, needing minA for stability. First bout used RW and second bout attempted without UE support as pt denying need for RW. Noted improved stability and confidence with RW as she took an extended period of time to even initiate a step without UE support then quickly returned to sit. Educated pt she needs to use her walker at home.  Stairs            Wheelchair Mobility    Modified Rankin (Stroke Patients Only)       Balance Overall balance assessment: Needs assistance Sitting-balance support: Bilateral upper extremity supported;Feet unsupported Sitting balance-Leahy Scale: Poor Sitting balance - Comments: UE support to sit edge of stretcher with feet dangling   Standing balance support: No upper extremity supported;Bilateral upper extremity supported;During functional activity Standing balance-Leahy Scale: Fair Standing balance comment: Able to stand statically and take a couple steps without UE support but required minA due to instability, benefits from RW.                             Pertinent Vitals/Pain Pain Assessment: No/denies pain    Home Living Family/patient expects to be discharged to:: Private residence Living Arrangements: Spouse/significant other;Children Available Help at Discharge: Family;Available 24 hours/day Type of Home: House Home Access: Stairs to enter   [employer] of Steps: 1 step   Home Layout: Two level;Able to live on main level with bedroom/bathroom Home Equipment: Rollator (4 wheels);Grab bars - tub/shower;Grab bars - toilet Additional Comments: Daughter lives upstairs and is primary caregiver along with pt's husband. Not on O2 at baseline, SpO2 rests at 88% at baseline.    Prior Function Prior Level of Function : Needs assist       Physical Assist : Mobility (physical);ADLs  (physical)     Mobility Comments: Pt has RW's however refuses to use, has had falls, husband and daughter assist with all mobility when pt allows it ADLs Comments: Husband assists with all ADL's, most likely providing max assist.     Hand Dominance   Dominant Hand: Right    Extremity/Trunk Assessment   Upper Extremity Assessment Upper Extremity Assessment: Defer to OT evaluation    Lower Extremity Assessment Lower Extremity Assessment: Generalized weakness (tremulous throughout body)    Cervical / Trunk Assessment Cervical / Trunk Assessment: Kyphotic  Communication   Communication: HOH  Cognition Arousal/Alertness: Awake/alert Behavior During Therapy: WFL for tasks assessed/performed Overall Cognitive Status: History of cognitive impairments - at baseline                                 General Comments: Pt with dementia, per daughter cognition waxes and wanes, at times can become very agitated.        General Comments General comments (skin integrity, edema, etc.): SpO2 down to 80% at rest on RA, required 5 L O2 to maintain sats >/= 87% at rest or when mobilizing; pt's baseline is 88% per daughters; discussed caregiver burden with daughters    Exercises     Assessment/Plan    PT Assessment Patient needs continued PT services  PT Problem List Decreased strength;Decreased activity tolerance;Decreased range of motion;Decreased balance;Decreased mobility;Decreased coordination;Decreased cognition;Decreased knowledge of use of DME;Decreased safety awareness;Cardiopulmonary  status limiting activity       PT Treatment Interventions DME instruction;Gait training;Stair training;Functional mobility training;Therapeutic activities;Therapeutic exercise;Balance training;Neuromuscular re-education;Cognitive remediation;Patient/family education    PT Goals (Current goals can be found in the Care Plan section)  Acute Rehab PT Goals Patient Stated Goal: go home PT  Goal Formulation: With patient/family Time For Goal Achievement: 08/24/21 Potential to Achieve Goals: Fair    Frequency Min 3X/week     Co-evaluation PT/OT/SLP Co-Evaluation/Treatment: Yes Reason for Co-Treatment: Necessary to address cognition/behavior during functional activity;For patient/therapist safety;To address functional/ADL transfers PT goals addressed during session: Mobility/safety with mobility;Balance;Proper use of DME         AM-PAC PT "6 Clicks" Mobility  Outcome Measure Help needed turning from your back to your side while in a flat bed without using bedrails?: A Little Help needed moving from lying on your back to sitting on the side of a flat bed without using bedrails?: A Lot Help needed moving to and from a bed to a chair (including a wheelchair)?: A Little Help needed standing up from a chair using your arms (e.g., wheelchair or bedside chair)?: A Little Help needed to walk in hospital room?: Total Help needed climbing 3-5 steps with a railing? : Total 6 Click Score: 13    End of Session Equipment Utilized During Treatment: Oxygen Activity Tolerance: Patient limited by fatigue Patient left: in bed;with call bell/phone within reach;with family/visitor present Nurse Communication: Mobility status PT Visit Diagnosis: Unsteadiness on feet (R26.81);Other abnormalities of gait and mobility (R26.89);Muscle weakness (generalized) (M62.81);History of falling (Z91.81);Difficulty in walking, not elsewhere classified (R26.2)    Time: 9201-0071 PT Time Calculation (min) (ACUTE ONLY): 33 min   Charges:   PT Evaluation $PT Eval Moderate Complexity: 1 Mod          Moishe Spice, PT, DPT Acute Rehabilitation Services  Pager: [phone] Office: [phone]   Orvan Falconer 08/10/2021, 1:30 PM

## 2021-08-10 NOTE — Progress Notes (Signed)
SATURATION QUALIFICATIONS: (This note is used to comply with regulatory documentation for home oxygen)  Patient Saturations on Room Air at Rest = 80%  Patient Saturations on Room Air while Ambulating = N/A  Patient Saturations on 5 Liters of oxygen while Ambulating = 87%  Please briefly explain why patient needs home oxygen: Pt requires 5 L of supplemental continuous O2 to maintain sats >/= 87% at rest and when mobilizing. Pt at 88% SpO2 at baseline per daughters.   Moishe Spice, PT, DPT Acute Rehabilitation Services  Pager: [phone] Office: [phone]

## 2021-08-10 NOTE — TOC Transition Note (Addendum)
Transition of Care [facility]) - CM/SW Discharge Note   Patient Details  Name: CAYLEI SPERRY MRN: [id number] Date of Birth: [birth year]  [facility] CM/SW Contact:  Verdell Carmine, RN Phone Number: 08/10/2021, 12:19 PM   Clinical Narrative:     Patient and family want patient home. Dx SHOB COVID.Marland KitchenPatient had to receive haldol for agitation. Family believes patient would be better at home. Home oxygen ordered via [employer] HHPT/OT ordered messaged Amy from [facility] to accept 1250 Accepted for [facility] by [facility] for PTOT       Patient Goals and CMS Choice        Discharge Placement             Home with home oxygen          Discharge Plan and Services                DME Arranged: Oxygen DME Agency: [employer] Date DME Agency Contacted: 08/10/21 Time DME Agency Contacted: 1219 Representative spoke with at DME Agency: [place] ([place]) Interventions     Readmission Risk Interventions No flowsheet data found.

## 2021-08-10 NOTE — Care Management Obs Status (Signed)
[place] NOTIFICATION   Patient Details  Name: ISSABELLE MCRANEY MRN: [id number] Date of Birth: Jun 28, 1928   Medicare Observation Status Notification Given:  Yes  Given over phone permission to sign  Verdell Carmine, RN 08/10/2021, 11:51 AM

## 2021-08-10 NOTE — Evaluation (Signed)
Occupational Therapy Evaluation Patient Details Name: [name] MRN: [id number] DOB: 02/19/1928 Today's Date: 08/10/2021   History of Present Illness Pt is a 88 y.o. female who presented 08/09/21 with cough and SOB. Pt with COVID pneumonia. PMH: dementia, CAD, atrial fibrillation on chronic anticoagulation, COPD, DVT, seizure disorder, laryngeal carcinoma s/p resection, and GERD   Clinical Impression   Pt admitted for concerns listed above. PTA pt's family reports that she has been mobilizing very short distances with assist, and receiving max A for all ADL's. Per family, pt typically only allows her husband to assist her with all tasks. At this time, pt demonstrating weakness, poor balance, and decreased activity tolerance. She requires min A physically for stand pivot transfers, however mod-max verbal cuing. Recommending HHOT.       Recommendations for follow up therapy are one component of a multi-disciplinary discharge planning process, led by the attending physician.  Recommendations may be updated based on patient status, additional functional criteria and insurance authorization.   Follow Up Recommendations  Home health OT    Assistance Recommended at Discharge Frequent or constant Supervision/Assistance  Patient can return home with the following A lot of help with walking and/or transfers;A lot of help with bathing/dressing/bathroom;Assistance with cooking/housework;Assistance with feeding;Direct supervision/assist for medications management;Direct supervision/assist for financial management;Assist for transportation;Help with stairs or ramp for entrance    Functional Status Assessment  Patient has had a recent decline in their functional status and/or demonstrates limited ability to make significant improvements in function in a reasonable and predictable amount of time  Equipment Recommendations  BSC/3in1    Recommendations for Other Services       Precautions /  Restrictions Precautions Precautions: Fall;Other (comment) Precaution Comments: monitor SpO2 Restrictions Weight Bearing Restrictions: No      Mobility Bed Mobility Overal bed mobility: Needs Assistance Bed Mobility: Supine to Sit;Sit to Supine     Supine to sit: Mod assist;HOB elevated Sit to supine: Mod assist;HOB elevated   General bed mobility comments: ModA to elevate trunk and scoot hips to edge of stretcher. ModA to manage legs back onto bed and slight assist at trunk.    Transfers Overall transfer level: Needs assistance Equipment used: Rolling walker (2 wheels);None Transfers: Sit to/from Stand;Bed to chair/wheelchair/BSC Sit to Stand: Min assist;+2 safety/equipment     Step pivot transfers: Min assist     General transfer comment: MinA to power up to stand from stretcher or commode to RW. 1x to no UE support with pt resting posterior aspect of legs of stretcher for stability. MinA to steady with stand step to L stretcher > commode, cuing pt to keep hands on RW and bring with her.      Balance Overall balance assessment: Needs assistance Sitting-balance support: Bilateral upper extremity supported;Feet unsupported Sitting balance-Leahy Scale: Poor Sitting balance - Comments: UE support to sit edge of stretcher with feet dangling   Standing balance support: No upper extremity supported;Bilateral upper extremity supported;During functional activity Standing balance-Leahy Scale: Fair Standing balance comment: Able to stand statically and take a couple steps without UE support but required minA due to instability, benefits from RW.                           ADL either performed or assessed with clinical judgement   ADL Overall ADL's : Needs assistance/impaired  General ADL Comments: Pt requiring max A +1-2 for all ADL's and functional mobility     Vision Baseline Vision/History: 1 Wears  glasses Ability to See in Adequate Light: 1 Impaired Patient Visual Report: No change from baseline Vision Assessment?: No apparent visual deficits     Perception     Praxis      Pertinent Vitals/Pain Pain Assessment: No/denies pain     Hand Dominance Right   Extremity/Trunk Assessment Upper Extremity Assessment Upper Extremity Assessment: Generalized weakness   Lower Extremity Assessment Lower Extremity Assessment: Defer to PT evaluation   Cervical / Trunk Assessment Cervical / Trunk Assessment: Kyphotic   Communication Communication Communication: HOH   Cognition Arousal/Alertness: Awake/alert Behavior During Therapy: WFL for tasks assessed/performed Overall Cognitive Status: History of cognitive impairments - at baseline                                 General Comments: Pt with dementia, per daughter cognition waxes and wanes, at times can become very agitated.     General Comments  SpO2 down to 80% at rest on RA, required 5 L O2 to maintain sats >/= 87% at rest or when mobilizing; pt's baseline is 88% per daughters; discussed caregiver burden with daughters    Exercises     Shoulder Instructions      Home Living Family/patient expects to be discharged to:: Private residence Living Arrangements: Spouse/significant other;Children Available Help at Discharge: Family;Available 24 hours/day Type of Home: House Home Access: Stairs to enter [employer] of Steps: 1 step   Home Layout: Two level;Able to live on main level with bedroom/bathroom     Bathroom Shower/Tub: Occupational psychologist: Handicapped height Bathroom Accessibility: Yes   Home Equipment: Rollator (4 wheels);Grab bars - tub/shower;Grab bars - toilet   Additional Comments: Daughter lives upstairs and is primary caregiver along with pt's husband. Not on O2 at baseline, SpO2 rests at 88% at baseline.      Prior Functioning/Environment Prior Level of Function  : Needs assist       Physical Assist : Mobility (physical);ADLs (physical)     Mobility Comments: Pt has RW's however refuses to use, has had falls, husband and daughter assist with all mobility when pt allows it ADLs Comments: Husband assists with all ADL's, most likely providing max assist.        OT Problem List: Cardiopulmonary status limiting activity;Decreased strength;Decreased range of motion;Decreased activity tolerance;Impaired balance (sitting and/or standing);Decreased cognition;Decreased safety awareness;Decreased knowledge of use of DME or AE      OT Treatment/Interventions: Self-care/ADL training;Therapeutic exercise;Energy conservation;DME and/or AE instruction;Therapeutic activities;Patient/family education;Balance training    OT Goals(Current goals can be found in the care plan section) Acute Rehab OT Goals Patient Stated Goal: to go home OT Goal Formulation: With patient/family Time For Goal Achievement: 08/10/21 Potential to Achieve Goals: Fair  OT Frequency:      Co-evaluation PT/OT/SLP Co-Evaluation/Treatment: Yes Reason for Co-Treatment: Necessary to address cognition/behavior during functional activity;For patient/therapist safety;To address functional/ADL transfers   OT goals addressed during session: Strengthening/ROM;Proper use of Adaptive equipment and DME      AM-PAC OT "6 Clicks" Daily Activity     Outcome Measure Help from another person eating meals?: A Lot Help from another person taking care of personal grooming?: A Lot Help from another person toileting, which includes using toliet, bedpan, or urinal?: A Lot Help from another person bathing (including washing,  rinsing, drying)?: A Lot Help from another person to put on and taking off regular upper body clothing?: A Lot Help from another person to put on and taking off regular lower body clothing?: A Lot 6 Click Score: 12   End of Session Equipment Utilized During Treatment: Gait  belt;Rolling walker (2 wheels);Oxygen Nurse Communication: Mobility status  Activity Tolerance: Patient limited by fatigue Patient left: in bed;with call bell/phone within reach;with family/visitor present  OT Visit Diagnosis: Unsteadiness on feet (R26.81);Other abnormalities of gait and mobility (R26.89);Muscle weakness (generalized) (M62.81)                Time: 1165-7903 OT Time Calculation (min): 23 min Charges:  OT General Charges $OT Visit: 1 Visit OT Evaluation $OT Eval Moderate Complexity: 1 Mod  Cheila Wickstrom H., OTR/L Acute Rehabilitation  Urania Pearlman Elane Yolanda Bonine 08/10/2021, 6:29 PM

## 2021-08-10 NOTE — Care Management CC44 (Signed)
Condition Code 44 Documentation Completed  Patient Details  Name: DOMINO HOLTEN MRN: [id number] Date of Birth: 1928-01-12   Condition Code 44 given:  Yes Patient signature on Condition Code 44 notice:  Yes Documentation of 2 MD's agreement:  Yes Code 44 added to claim:  Yes  Given via phone permission to sign verbally  Verdell Carmine, RN 08/10/2021, 11:51 AM

## 2021-08-10 NOTE — Discharge Summary (Addendum)
[name]:[id number] DOB: 1927-08-26 DOA: 08/09/2021  PCP: Marin Olp, MD  Admit date: 08/09/2021  Discharge date: 08/10/2021  Admitted From: Home     Disposition:  Home - at family's request I do not wish the patient to stay in the hospital, they fully understand that this is not medically advised and that taking her home can result in significant has declined an even stroke, death or disability.  Her daughter Boston Service understands this and still wants to take the patient home as she thinks her mother will fare better at home than at the hospital, they are signing out [place].  I will try to provide as much help as I can.   Recommendations for Outpatient Follow-up:   Follow up with PCP in 1-2 weeks  PCP Please obtain BMP/CBC, 2 view CXR in 1week,  (see Discharge instructions)   PCP Please follow up on the following pending results: Chest x-ray, CBC CMP and magnesium level in 7 to 10 days.   Home Health: None desired by daughter Carlene Equipment/Devices: 02 2-5 lits Consultations: Initially refused but now requesting home PT which I will order if she qualifies. Discharge Condition: Guarded  CODE STATUS: Full    Diet Recommendation: Soft   CC - Hypoxia    Brief history of present illness from the day of admission and additional interim summary    [name] is a 88 y.o. female with medical history significant of dementia, CAD, atrial fibrillation on chronic anticoagulation, COPD, DVT, seizure disorder, laryngeal carcinoma s/p resection, and GERD presents with complaints of cough and low oxygen levels as checked by family members at home.  Apparently patient was having a cough for the last few days went to PCP office where she was found to be hypoxic and positive for COVID-19 infection, they were  asked to go to the hospital but family chose to take her home, when hypoxia continued family decided to bring her to the hospital.                                                                 Hospital Course    Acute hypoxic respiratory failure due to COVID-19 pneumonia.  She has already been started on Molnupiravir by PCP and daughter requested to complete the course at home, will give her steroid taper along with oxygen, continue home albuterol treatments.  She has also been started on antibiotics here for a possible mild bacterial bronchitis for which she will receive 3 more doses of oral azithromycin.  She appears relatively stable and nontoxic at this time, note daughter Boston Service insisting that patient to be discharged home and assumes all responsibility for any adverse outcome.  See discussion above.  Daughter states that if she gets worse she will be brought back to the  hospital.  Note on room air her pulse ox at rest is 95% upon application of 2 - 5  L nasal cannula oxygen she is up to 92%.   2.  Other medical problems of paroxysmal atrial fibrillation, delirium with dementia, essential hypertension are all stable for which home medications will be continued unchanged.  Hypomagnesemia.  Has been replaced IV.  PCP to recheck.   Discharge diagnosis     Principal Problem:   Acute respiratory failure with hypoxia (HCC) Active Problems:   Hyperlipidemia   Depression   AF (paroxysmal atrial fibrillation) (HCC)   Pneumonia   CKD (chronic kidney disease), stage III ([place])   COVID-19   Dementia with behavioral disturbance    Discharge instructions    Discharge Instructions     Discharge instructions   Complete by: As directed    Follow with Primary MD Marin Olp, MD in 7 days   Get CBC, CMP, 2 view Chest X ray -  checked next visit within 1 week by Primary MD    Activity: As tolerated with Full fall precautions use walker/cane & assistance as needed  Disposition Home    Diet: Soft with feeding assistance and aspiration precautions.    Special Instructions: If you have smoked or chewed Tobacco  in the last 2 yrs please stop smoking, stop any regular Alcohol  and or any Recreational drug use.  On your next visit with your primary care physician please Get Medicines reviewed and adjusted.  Please request your Prim.MD to go over all Hospital Tests and Procedure/Radiological results at the follow up, please get all Hospital records sent to your Prim MD by signing hospital release before you go home.  If you experience worsening of your admission symptoms, develop shortness of breath, life threatening emergency, suicidal or homicidal thoughts you must seek medical attention immediately by calling 911 or calling your MD immediately  if symptoms less severe.  You Must read complete instructions/literature along with all the possible adverse reactions/side effects for all the Medicines you take and that have been prescribed to you. Take any new Medicines after you have completely understood and accpet all the possible adverse reactions/side effects.   Increase activity slowly   Complete by: As directed    MyChart COVID-19 home monitoring program   Complete by: Aug 10, 2021    Is the patient willing to use the [place] for home monitoring?: Yes   Temperature monitoring   Complete by: Aug 10, 2021    After how many days would you like to receive a notification of this patient's flowsheet entries?: 1       Discharge Medications   Allergies as of 08/10/2021       Reactions   Ativan [lorazepam] Other (See Comments)   Severe hallucinations   Gabapentin    encephalopathy   Propoxyphene Hcl Nausea And Vomiting   Codeine Itching   Penicillins Rash   Has patient had a PCN reaction causing immediate rash, facial/tongue/throat swelling, SOB or lightheadedness with hypotension: Yes Has patient had a PCN reaction causing severe rash involving mucus  membranes or skin necrosis: Unk Has patient had a PCN reaction that required hospitalization: No Has patient had a PCN reaction occurring within the last 10 years: Yes If all of the above answers are "NO", then may proceed with Cephalosporin use.   Vancomycin Rash        Medication List     STOP taking these medications  hydrocortisone 2.5 % rectal cream Commonly known as: ANUSOL-HC   ketoconazole 2 % cream Commonly known as: NIZORAL   lidocaine 5 % ointment Commonly known as: XYLOCAINE   NONFORMULARY OR COMPOUNDED ITEM   triamcinolone cream 0.1 % Commonly known as: KENALOG   trimethoprim-polymyxin b ophthalmic solution Commonly known as: POLYTRIM       TAKE these medications    albuterol 108 (90 Base) MCG/ACT inhaler Commonly known as: VENTOLIN HFA TAKE 2 PUFFS BY MOUTH EVERY 6 HOURS AS NEEDED FOR WHEEZE OR SHORTNESS OF BREATH   atorvastatin 20 MG tablet Commonly known as: LIPITOR TAKE 1 TABLET BY MOUTH EVERY DAY   azithromycin 250 MG tablet Commonly known as: Zithromax Z-Pak Take 1 tablet (250 mg total) by mouth daily.   benzonatate 100 MG capsule Commonly known as: TESSALON Take 1 capsule (100 mg total) by mouth 2 (two) times daily as needed for cough. What changed: See the new instructions.   bisoprolol 5 MG tablet Commonly known as: ZEBETA TAKE 1 TABLET BY MOUTH EVERY DAY   busPIRone 5 MG tablet Commonly known as: [name] TAKE 1 TABLET BY MOUTH TWICE A DAY   cholecalciferol 1000 units tablet Commonly known as: VITAMIN D Take 1,000 Units by mouth daily.   levETIRAcetam 1000 MG tablet Commonly known as: KEPPRA TAKE 1 TABLET BY MOUTH TWICE A DAY   loperamide 2 MG capsule Commonly known as: IMODIUM Take 2 mg by mouth daily as needed for diarrhea or loose stools.   methylPREDNISolone 4 MG Tbpk tablet Commonly known as: MEDROL DOSEPAK follow package directions   molnupiravir EUA 200 mg Caps capsule Commonly known as: LAGEVRIO Take 4  capsules (800 mg total) by mouth 2 (two) times daily for 5 days.   QUEtiapine 25 MG tablet Commonly known as: SEROQUEL 1 TABLET BEDTIME   sertraline 50 MG tablet Commonly known as: ZOLOFT TAKE 1 TABLET BY MOUTH EVERY DAY   Tiotropium Bromide-Olodaterol 2.5-2.5 MCG/ACT Aers Inhale 2 puffs into the lungs daily.   VITAMIN B-12 PO Take 1 tablet by mouth daily.   Xarelto 15 MG Tabs tablet Generic drug: Rivaroxaban TAKE 1 TABLET BY MOUTH EVERY DAY WITH SUPPER               Durable Medical Equipment  (From admission, onward)           Start     Ordered   08/10/21 1230  For home use only DME oxygen  Once       Question Answer Comment  Length of Need 6 Months   Mode or (Route) Nasal cannula   Liters per Minute 5   Frequency Continuous (stationary and portable oxygen unit needed)   Oxygen conserving device Yes   Oxygen delivery system Gas      08/10/21 1229             Follow-up Information     Marin Olp, MD. Schedule an appointment as soon as possible for a visit in 1 week(s).   Specialty: Family Medicine Contact information: [address] [place] [address] [phone]                 Major procedures and Radiology Reports - PLEASE review detailed and final reports thoroughly  -        DG Chest Port 1 View  Result Date: 08/10/2021 CLINICAL DATA:  88 year old female with cough and shortness of breath. Positive COVID-19. EXAM: PORTABLE CHEST 1 VIEW COMPARISON:  Portable chest 08/09/2021  and earlier. FINDINGS: Portable AP semi upright view at 0738 hours. Lung volumes not significantly changed. Calcified aortic atherosclerosis. Stable cardiomegaly and mediastinal contours. Visualized tracheal air column is within normal limits. No pneumothorax. Increasing patchy and indistinct opacity in the right upper lobe along the minor fissure and at the right lung base. Increased confluent opacity at the left lung base, left hemidiaphragm  obscured. No pulmonary edema suspected. Osteopenia. No acute osseous abnormality identified. IMPRESSION: 1. Increasing bilateral pulmonary opacity, most confluent at the left lung base, compatible with progressed COVID-19 pneumonia. 2. No pneumothorax.  Small left pleural effusion not excluded. Electronically Signed   By: Genevie Ann M.D.   On: 08/10/2021 07:51   DG Chest Portable 1 View  Result Date: 08/09/2021 CLINICAL DATA:  Pt complains of shortness of breath. Patient alert but not talking, history provided by husband who was present in the room. He states that same began 2 days ago, was COVID-positive at primary care yesterday. Patient arrives satting 67% on 2L, up to 95% on 6L. Husband states she has been refusing to eat or drink or take her medication for the past 2 days. According to husband she is normally alert and oriented and speaking normally.Hx of COPD, CAD, DVT, HCAP (2014), Heart murmur, HTN, lymphoma, laryngeal carcinoma, former smoker EXAM: PORTABLE CHEST 1 VIEW COMPARISON:  05/07/2021. FINDINGS: Decreased opacity at the lung bases, most evident on the right. Lungs are hyperexpanded with chronically prominent markings. No convincing pleural effusion.  No pneumothorax. Cardiac silhouette top-normal in size. No mediastinal or hilar masses. Skeletal structures are demineralized, but grossly intact. IMPRESSION: 1. Increased opacity at the lung bases, most evident on the right. Suspect right lower lobe pneumonia. No evidence of pulmonary edema. Electronically Signed   By: Lajean Manes M.D.   On: 08/09/2021 13:34    Micro Results     Recent Results (from the past 240 hour(s))  Resp Panel by RT-PCR (Flu A&B, Covid) Nasopharyngeal Swab     Status: Abnormal   Collection Time: 08/09/21 11:29 AM   Specimen: Nasopharyngeal Swab; Nasopharyngeal(NP) swabs in vial transport medium  Result Value Ref Range Status   SARS Coronavirus 2 by RT PCR POSITIVE (A) NEGATIVE Final    Comment: (NOTE) SARS-CoV-2  target nucleic acids are DETECTED.  The SARS-CoV-2 RNA is generally detectable in upper respiratory specimens during the acute phase of infection. Positive results are indicative of the presence of the identified virus, but do not rule out bacterial infection or co-infection with other pathogens not detected by the test. Clinical correlation with patient history and other diagnostic information is necessary to determine patient infection status. The expected result is Negative.  Fact Sheet for Patients: EntrepreneurPulse.com.au  Fact Sheet for Healthcare Providers: IncredibleEmployment.be  This test is not yet approved or cleared by the [place] FDA and  has been authorized for detection and/or diagnosis of SARS-CoV-2 by FDA under an Emergency Use Authorization (EUA).  This EUA will remain in effect (meaning this test can be used) for the duration of  the COVID-19 declaration under Section 564(b)(1) of the A ct, 21 U.S.C. section 360bbb-3(b)(1), unless the authorization is terminated or revoked sooner.     Influenza A by PCR NEGATIVE NEGATIVE Final   Influenza B by PCR NEGATIVE NEGATIVE Final    Comment: (NOTE) The Xpert Xpress SARS-CoV-2/FLU/RSV plus assay is intended as an aid in the diagnosis of influenza from Nasopharyngeal swab specimens and should not be used as a sole basis for treatment. Nasal  washings and aspirates are unacceptable for Xpert Xpress SARS-CoV-2/FLU/RSV testing.  Fact Sheet for Patients: EntrepreneurPulse.com.au  Fact Sheet for Healthcare Providers: IncredibleEmployment.be  This test is not yet approved or cleared by the [place] FDA and has been authorized for detection and/or diagnosis of SARS-CoV-2 by FDA under an Emergency Use Authorization (EUA). This EUA will remain in effect (meaning this test can be used) for the duration of the COVID-19 declaration under Section  564(b)(1) of the Act, 21 U.S.C. section 360bbb-3(b)(1), unless the authorization is terminated or revoked.  Performed at [facility], [place] [address] [place], [place] [address]     Today   Subjective    Harumi Yamin today has no headache,no chest abdominal pain,no new weakness tingling or numbness, feels much better wants to go home today.     Objective   Blood pressure (!) 168/99, pulse 89, temperature 99.6 F (37.6 C), resp. rate 16, SpO2 94 %.   Intake/Output Summary (Last 24 hours) at 08/10/2021 1230 Last data filed at 08/10/2021 1031 Gross per 24 hour  Intake 553.38 ml  Output --  Net 553.38 ml    Exam  Awake but confused, no focal deficits,  [place].AT,PERRAL Supple Neck,No JVD, No cervical lymphadenopathy appriciated.  Symmetrical Chest wall movement, Good air movement bilaterally, CTAB RRR,No Gallops,Rubs or new Murmurs, No Parasternal Heave +ve B.Sounds, Abd Soft, Non tender, No organomegaly appriciated, No rebound -guarding or rigidity. No Cyanosis, Clubbing or edema, No new Rash or bruise   Data Review   CBC w Diff:  Lab Results  Component Value Date   WBC 3.5 (L) 08/10/2021   HGB 12.5 08/10/2021   HGB 12.4 09/05/2012   HCT 40.5 08/10/2021   HCT 39.4 09/05/2012   PLT 154 08/10/2021   PLT 151 09/05/2012   LYMPHOPCT 17 08/10/2021   LYMPHOPCT 12.2 (L) 09/05/2012   BANDSPCT 0 03/23/2012   MONOPCT 4 08/10/2021   MONOPCT 9.5 09/05/2012   EOSPCT 0 08/10/2021   EOSPCT 3.3 09/05/2012   BASOPCT 0 08/10/2021   BASOPCT 0.5 09/05/2012    CMP:  Lab Results  Component Value Date   NA 137 08/10/2021   NA 139 07/06/2012   K 4.3 08/10/2021   K 4.4 07/06/2012   CL 105 08/10/2021   CL 105 07/06/2012   CO2 22 08/10/2021   CO2 21 (L) 07/06/2012   BUN 20 08/10/2021   BUN 11.0 07/06/2012   CREATININE 0.98 08/10/2021   CREATININE 1.27 (H) 06/24/2020   CREATININE 1.0 07/06/2012   PROT 7.2 08/10/2021   PROT 6.5 07/06/2012   ALBUMIN 3.1 (L) 08/10/2021    ALBUMIN 3.9 07/06/2012   BILITOT 0.6 08/10/2021   BILITOT 0.51 07/06/2012   ALKPHOS 64 08/10/2021   ALKPHOS 74 07/06/2012   AST 19 08/10/2021   AST 26 07/06/2012   ALT 12 08/10/2021   ALT 24 07/06/2012  .   Total Time in preparing paper work, data evaluation and todays exam - 75 minutes  Lala Lund M.D on 08/10/2021 at 12:30 PM  [facility]

## 2021-08-11 ENCOUNTER — Telehealth: Payer: Self-pay | Admitting: Family Medicine

## 2021-08-11 NOTE — Telephone Encounter (Signed)
She has both an antibiotic and steroid prescribed by the hospital doctors-steroid below and azithromycin was also prescribed with receipt confirmed by pharmacy noted-I would have her pick up both prescriptions and take these.  Thrilled she has oxygen at least.  I am sorry she had to wait so long  methylPREDNISolone (MEDROL DOSEPAK) 4 MG TBPK tablet 21 tablet 0 08/10/2021    Sig: follow package directions   Sent to pharmacy as: methylPREDNISolone (MEDROL DOSEPAK) 4 MG Tablet Therapy Pack tablet   E-Prescribing Status: Receipt confirmed by pharmacy (08/10/2021 11:22 AM EST)

## 2021-08-11 NOTE — Telephone Encounter (Signed)
Closing this note because another note is already open

## 2021-08-11 NOTE — Telephone Encounter (Signed)
Is pharmacy patient wants the same as the one that it was sent to? Please confirm with patients family desired pharmcyhand then if it is the same can call pharmacy listed and ask what the issue is- you may resend at that time if needed. You may also resend these 3 rx to a different desired pharmacy   If they want a virtual work in 24 40 on Wednesday could work- I look pretty full tomorrow unless there is a cancellation.   Sending to [place] as well with Bevelyn Ngo out tomorrow to make sure clinical and admin piece are addressed

## 2021-08-11 NOTE — Telephone Encounter (Signed)
Pts daughter called and said that she went to the pharmacy to pickup medications for the patient today and the pharmacy said they didn't have any medications for her. The meds were sent in by the hospital and she is wondering if there is anything we can do. Please advise. Meds are: azithromycin (ZITHROMAX Z-PAK) 250 MG tablet benzonatate (TESSALON) 100 MG capsule methylPREDNISolone (MEDROL DOSEPAK) 4 MG TBPK tablet

## 2021-08-11 NOTE — Telephone Encounter (Signed)
Pts daughter called back and said that when she was seen by Dr Yong Channel that she was told she needed to go to the hospital that she refused but finally went Friday, she was never put in a room and sat in emergency for 30 hours. Pt was released yesterday and she has Pneumonia. She aspirated, pts daughter said that they were going to send an antibiotic and prednisone and they never sent home any of that, they did send home oxygen but that was it so she was calling to see what we can do. She said the pts husband also husband also tested positive for covid so I will send in separate message for that. Please advise, callback [phone]

## 2021-08-11 NOTE — Telephone Encounter (Signed)
I have called daughter to schedule appointment for patient.  Daughter is requesting to only see Dr. Yong Channel.  Is requesting work in.  Also would like to know more about Hospice care that was ordered at ED.  States hospital did not discuss this with the family.

## 2021-08-11 NOTE — Telephone Encounter (Signed)
Ok to work in, if so where?

## 2021-08-11 NOTE — Telephone Encounter (Signed)
Ok to re-send?  °

## 2021-08-11 NOTE — Telephone Encounter (Signed)
Called and spoke with pt and below message given.  Pt daughter would like to be called to schedule a virtual appointment to discuss medications for pt [phone].

## 2021-08-11 NOTE — Telephone Encounter (Signed)
See below

## 2021-08-12 ENCOUNTER — Telehealth (INDEPENDENT_AMBULATORY_CARE_PROVIDER_SITE_OTHER): Payer: Medicare Other | Admitting: Family Medicine

## 2021-08-12 ENCOUNTER — Other Ambulatory Visit: Payer: Self-pay

## 2021-08-12 ENCOUNTER — Ambulatory Visit: Payer: Medicare Other | Admitting: Podiatry

## 2021-08-12 ENCOUNTER — Encounter: Payer: Self-pay | Admitting: Family Medicine

## 2021-08-12 VITALS — BP 119/71 | Ht 64.0 in | Wt 176.0 lb

## 2021-08-12 DIAGNOSIS — U071 COVID-19: Secondary | ICD-10-CM | POA: Diagnosis not present

## 2021-08-12 NOTE — Patient Instructions (Signed)
Health Maintenance Due  Topic Date Due   Zoster Vaccines- Shingrix (1 of 2) Never done   DEXA SCAN  Never done   TETANUS/TDAP  01/26/2021    Recommended follow up: No follow-ups on file.

## 2021-08-12 NOTE — Telephone Encounter (Signed)
Spoke with daughter.  Patient is scheduled for virtual visit this afternoon w/ [place].    Daughter states she has taken care of medication for patient.

## 2021-08-12 NOTE — Progress Notes (Signed)
Phone [phone] Virtual visit via Video note   Subjective:  Chief complaint: Chief Complaint  Patient presents with   Hospitalization Follow-up    ED follow up    Covid Positive    Has questions and concerns   This visit type was conducted due to national recommendations for restrictions regarding the COVID-19 Pandemic (e.g. social distancing).  This format is felt to be most appropriate for this patient at this time balancing risks to patient and risks to population by having him in for in person visit.  No physical exam was performed (except for noted visual exam or audio findings with Telehealth visits).    Our team/I connected with [name] at  4:20 PM EST by a video enabled telemedicine application ([email or web address] or caregility through epic) and verified that I am speaking with the correct person using two identifiers.  Location patient: [place] Location provider: [facility], office Persons participating in the virtual visit:  patient  Our team/I discussed the limitations of evaluation and management by telemedicine and the availability of in person appointments. In light of current covid-19 pandemic, patient also understands that we are trying to protect them by minimizing in office contact if at all possible.  The patient expressed consent for telemedicine visit and agreed to proceed. Patient understands insurance will be billed.   Past Medical History-  Patient Active Problem List   Diagnosis Date Noted   Major neurocognitive disorder ([place]) 02/24/2021    Priority: High   Memory loss 12/14/2019    Priority: High   Diastolic CHF ([place]) 08/67/6195    Priority: High   Acute respiratory failure with hypoxia ([place]) 11/19/2015    Priority: High   History of cardioembolic stroke 09/32/6712    Priority: High   Spinal stenosis, lumbar region, with neurogenic claudication 05/09/2014    Priority: High   History of Focal seizure ([place]) 06/16/2013    Priority: High   History of  TIA (transient ischemic attack) 05/31/2013    Priority: High   History of Large B-cell lymphoma  02/18/2012    Priority: High   AF (paroxysmal atrial fibrillation) ([place]) 12/14/2008    Priority: High   Mild aortic stenosis 08/26/2020    Priority: Medium    Recurrent major depressive disorder, in full remission ([place]) 10/16/2019    Priority: Medium    Agitation 03/11/2018    Priority: Medium    History of chronic respiratory failure- prior on o2 06/15/2017    Priority: Medium    Bacteremia due to Streptococcus 11/21/2015    Priority: Medium    Cellulitis of leg, right 11/20/2015    Priority: Medium    Diverticulitis of colon 09/26/2014    Priority: Medium    CKD (chronic kidney disease), stage III ([place]) 09/26/2014    Priority: Medium    Laryngeal carcinoma ([place])     Priority: Medium    Anxiety state 10/20/2008    Priority: Medium    Depression 10/20/2008    Priority: Medium    Dyspnea on exertion 07/06/2008    Priority: Medium    COPD GOLD II 03/26/2008    Priority: Medium    GERD (gastroesophageal reflux disease) 05/03/2007    Priority: Medium    Hyperlipidemia 03/18/2007    Priority: Medium    Essential hypertension 03/18/2007    Priority: Medium    Loose stools 06/01/2019    Priority: Low   Hemorrhoids 06/01/2019    Priority: Low   Chronic diarrhea 09/04/2017    Priority:  Low   Aortic atherosclerosis ([facility]) 06/30/2016    Priority: Low   Weakness generalized 06/30/2013    Priority: Low   Bilateral hand pain 01/09/2013    Priority: Low   COVID-19 08/09/2021   Dementia with behavioral disturbance 08/09/2021   Acute conjunctivitis of right eye 06/13/2021   Pneumonia 06/26/2013    Medications- reviewed and updated Current Outpatient Medications  Medication Sig Dispense Refill   albuterol (VENTOLIN HFA) 108 (90 Base) MCG/ACT inhaler TAKE 2 PUFFS BY MOUTH EVERY 6 HOURS AS NEEDED FOR WHEEZE OR SHORTNESS OF BREATH 18 each 1   atorvastatin (LIPITOR) 20 MG tablet TAKE  1 TABLET BY MOUTH EVERY DAY 90 tablet 1   azithromycin (ZITHROMAX Z-PAK) 250 MG tablet Take 1 tablet (250 mg total) by mouth daily. 3 each 0   benzonatate (TESSALON) 100 MG capsule Take 1 capsule (100 mg total) by mouth 2 (two) times daily as needed for cough. 20 capsule 0   bisoprolol (ZEBETA) 5 MG tablet TAKE 1 TABLET BY MOUTH EVERY DAY 90 tablet 1   busPIRone ([name]) 5 MG tablet TAKE 1 TABLET BY MOUTH TWICE A DAY 180 tablet 3   cholecalciferol (VITAMIN D) 1000 UNITS tablet Take 1,000 Units by mouth daily.     Cyanocobalamin (VITAMIN B-12 PO) Take 1 tablet by mouth daily.     levETIRAcetam (KEPPRA) 1000 MG tablet TAKE 1 TABLET BY MOUTH TWICE A DAY 180 tablet 1   loperamide (IMODIUM) 2 MG capsule Take 2 mg by mouth daily as needed for diarrhea or loose stools.      methylPREDNISolone (MEDROL DOSEPAK) 4 MG TBPK tablet follow package directions 21 tablet 0   molnupiravir EUA (LAGEVRIO) 200 mg CAPS capsule Take 4 capsules (800 mg total) by mouth 2 (two) times daily for 5 days. 40 capsule 0   QUEtiapine (SEROQUEL) 25 MG tablet 1 TABLET BEDTIME 30 tablet 1   sertraline (ZOLOFT) 50 MG tablet TAKE 1 TABLET BY MOUTH EVERY DAY 90 tablet 1   Tiotropium Bromide-Olodaterol 2.5-2.5 MCG/ACT AERS Inhale 2 puffs into the lungs daily. 4 g 1   XARELTO 15 MG TABS tablet TAKE 1 TABLET BY MOUTH EVERY DAY WITH SUPPER 30 tablet 5   No current facility-administered medications for this visit.     Objective:  BP 119/71    Ht 5\' 4"  (1.626 m)    Wt 176 lb (79.8 kg)    LMP  (LMP Unknown)    SpO2 96% Comment: on 5 L   BMI 30.21 kg/m  self reported vitals Gen: NAD, resting comfortably Lungs: nonlabored, normal respiratory rate , wearing oxygen at 5 L Skin: appears dry, no obvious rash    Assessment and Plan   # ED F/U for COVID-19 Infection S: Patient was seen 08/07/2021 where her daughter reported her mother had not been eating or drinking for at least 2 and 1/2 days. Patient did test positive for COVID and I had  started her on Molnupiravir as planned treatment.   Patient presented to the ED 08/09/2021 with her husband and daughter due to an dropping home oxygen levels. She reported being diagnosed with COVID and was treated with antivirals - had not been taking them and showed unwillingness to take them as stated by daughter. Patient was also hypoxic per triage.  PCR Covid swab was ordered and showed that patient was positive for COVID-19.  EKG was also ordered and showed sinus rhythm possible prior anteroseptal infarct.  A chest x-ray was also ordered during  the encounter and showed increased opacity at the lung bases, most evident on the right , also suspected right lower lobe pneumonia. There was no evidence of pulmonary edema.  Repeat within 24 hours showed increasing bilateral pulmonary opacity most confluent at left lung base compatible with progressed COVID-19 pneumonia.  Possible small left pleural effusion  Dr. Vanita Panda was concerned for pneumonia which was likely complicated with patient's known COVID infection. With her new hypoxia and known COPD she was started on supplemental oxygen. Also given antibiotics - given her penicillin allergy she was given azithromycin.  Also given steroids and Tessalon.  There was a conversation given patient had been in emergency room for 30 hours about discharge to home with home oxygen since patient was combative/disgruntled in the emergency department while she was alone (she can be this in our office as well at times but not typically combative) and family ultimately opted to take patient home.  They signed patient out AMA but with the guidance of emergency room care team to be able to navigate caring for her best at home.  Apparently had not had fluids for some time in hospital and patient had to have IV replacement-with combative nature I wonder if it is possible she pulled this out..   Today, family reports continued improvement. Has started to eat more and drinking  more finally. Patient woke up with very sore throat and basically whispering.  A/P: Patient with COVID-19 and seems to be steadily improving with antivirals-getting close to finish course.  I suspect COVID-pneumonia based on x-ray but she is also covered for bacterial pneumonia with azithromycin and treated with Medrol for COPD-we will continue current medications  Extensive counseling needed today about the difficulties of navigating emergency department and meeting of AMA as well as the significance of palliative care (family had a consult in the hospital and they were concerned this equated to patient's impending death/hospice and I explained the role of palliative care-they also have a follow-up visit coming up next week I believe)  Hospital team and recommended repeat chest x-ray in 1 week-we discussed pneumonia typically does not clear within this timeframe and possibly repeating within a month.  We are going to schedule follow-up family for next Thursday when I have a same-day slot available-they will call him tomorrow morning to schedule this-she would be beyond 10 days at that point -Her family is really doing an amazing job caring for her.  They are extremely caring and want to improve quality of life as much as possible for patient.  I think a palliative consult is a very good idea.  I even wonder about a MOST form given her experience in the hospital.  I am hopeful able to delve into CODE STATUS as well-we discussed today I thought she would have poor outcome if required resuscitation.  In regards to oxygen on 5 L and oxygen at 96%-we discussed cutting back on oxygen and targeting between 90 and 95% oxygen  Recommended follow up:  Future Appointments  Date Time Provider [place]  01/13/2022  2:30 PM Pieter Partridge, DO [facility] None  04/10/2022  3:15 PM [facility] HEALTH COACH [facility]    Lab/Order associations:   ICD-10-CM   1. COVID-19  U07.1      Time Spent: 35 minutes of  total time (4:59 PM-5:29 PM, 8:45 PM-8:50 PM) was spent on the date of the encounter performing the following actions: chart review prior to seeing the patient, obtaining history, performing a medically  necessary exam, counseling on the treatment plan, placing orders, and documenting in our EHR.   Return precautions advised.  [name], MD

## 2021-08-18 ENCOUNTER — Telehealth: Payer: Self-pay

## 2021-08-18 NOTE — Telephone Encounter (Signed)
See below

## 2021-08-18 NOTE — Telephone Encounter (Signed)
Received hospice referral.  States family is requesting Dr. Yong Channel to be the attending provider.    Please follow back up in regard.

## 2021-08-18 NOTE — Telephone Encounter (Signed)
Yes thanks 

## 2021-08-19 NOTE — Telephone Encounter (Signed)
Called and spoke with Tammy and gave below message.

## 2021-08-21 ENCOUNTER — Ambulatory Visit: Admitting: Family Medicine

## 2021-08-21 ENCOUNTER — Encounter: Payer: Self-pay | Admitting: Family Medicine

## 2021-08-21 ENCOUNTER — Other Ambulatory Visit: Payer: Self-pay

## 2021-08-21 VITALS — BP 130/60 | HR 67 | Temp 97.3°F | Ht 64.0 in | Wt 170.4 lb

## 2021-08-21 DIAGNOSIS — N1832 Chronic kidney disease, stage 3b: Secondary | ICD-10-CM

## 2021-08-21 DIAGNOSIS — I5030 Unspecified diastolic (congestive) heart failure: Secondary | ICD-10-CM | POA: Diagnosis not present

## 2021-08-21 DIAGNOSIS — F0390 Unspecified dementia without behavioral disturbance: Secondary | ICD-10-CM

## 2021-08-21 DIAGNOSIS — I480 Paroxysmal atrial fibrillation: Secondary | ICD-10-CM

## 2021-08-21 DIAGNOSIS — U071 COVID-19: Secondary | ICD-10-CM

## 2021-08-21 DIAGNOSIS — I10 Essential (primary) hypertension: Secondary | ICD-10-CM

## 2021-08-21 DIAGNOSIS — E785 Hyperlipidemia, unspecified: Secondary | ICD-10-CM

## 2021-08-21 DIAGNOSIS — I700 Atherosclerosis of aorta: Secondary | ICD-10-CM | POA: Diagnosis not present

## 2021-08-21 DIAGNOSIS — J1282 Pneumonia due to coronavirus disease 2019: Secondary | ICD-10-CM

## 2021-08-21 DIAGNOSIS — F3342 Major depressive disorder, recurrent, in full remission: Secondary | ICD-10-CM

## 2021-08-21 DIAGNOSIS — J9611 Chronic respiratory failure with hypoxia: Secondary | ICD-10-CM

## 2021-08-21 MED ORDER — BENZONATATE 100 MG PO CAPS: 100.0000 mg | ORAL_CAPSULE | Freq: Two times a day (BID) | ORAL | 0 refills | Status: DC | PRN

## 2021-08-21 NOTE — Progress Notes (Signed)
Phone [phone] In person visit   Subjective:   [name] is a 88 y.o. year old very pleasant female patient who presents for/with See problem oriented charting Chief Complaint  Patient presents with   Follow-up    Pt is f/u from the ER with [facility].   This visit occurred during the SARS-CoV-2 public health emergency.  Safety protocols were in place, including screening questions prior to the visit, additional usage of staff PPE, and extensive cleaning of exam room while observing appropriate contact time as indicated for disinfecting solutions.   Past Medical History-  Patient Active Problem List   Diagnosis Date Noted   Major neurocognitive disorder ([place]) 02/24/2021    Priority: High   Memory loss 12/14/2019    Priority: High   Diastolic CHF ([place]) 82/95/6213    Priority: High   Acute respiratory failure with hypoxia ([place]) 11/19/2015    Priority: High   History of cardioembolic stroke 08/65/7846    Priority: High   Spinal stenosis, lumbar region, with neurogenic claudication 05/09/2014    Priority: High   History of Focal seizure ([place]) 06/16/2013    Priority: High   History of TIA (transient ischemic attack) 05/31/2013    Priority: High   History of Large B-cell lymphoma  02/18/2012    Priority: High   AF (paroxysmal atrial fibrillation) ([place]) 12/14/2008    Priority: High   Mild aortic stenosis 08/26/2020    Priority: Medium    Recurrent major depressive disorder, in full remission ([place]) 10/16/2019    Priority: Medium    Agitation 03/11/2018    Priority: Medium    History of chronic respiratory failure- prior on o2 06/15/2017    Priority: Medium    Bacteremia due to Streptococcus 11/21/2015    Priority: Medium    Cellulitis of leg, right 11/20/2015    Priority: Medium    Diverticulitis of colon 09/26/2014    Priority: Medium    CKD (chronic kidney disease), stage III ([place]) 09/26/2014    Priority: Medium    Laryngeal carcinoma ([place])     Priority:  Medium    Anxiety state 10/20/2008    Priority: Medium    Depression 10/20/2008    Priority: Medium    Dyspnea on exertion 07/06/2008    Priority: Medium    COPD GOLD II 03/26/2008    Priority: Medium    GERD (gastroesophageal reflux disease) 05/03/2007    Priority: Medium    Hyperlipidemia 03/18/2007    Priority: Medium    Essential hypertension 03/18/2007    Priority: Medium    Loose stools 06/01/2019    Priority: Low   Hemorrhoids 06/01/2019    Priority: Low   Chronic diarrhea 09/04/2017    Priority: Low   Aortic atherosclerosis ([place]) 06/30/2016    Priority: Low   Weakness generalized 06/30/2013    Priority: Low   Bilateral hand pain 01/09/2013    Priority: Low   COVID-19 08/09/2021   Dementia with behavioral disturbance 08/09/2021   Acute conjunctivitis of right eye 06/13/2021   Pneumonia 06/26/2013    Medications- reviewed and updated Current Outpatient Medications  Medication Sig Dispense Refill   albuterol (VENTOLIN HFA) 108 (90 Base) MCG/ACT inhaler TAKE 2 PUFFS BY MOUTH EVERY 6 HOURS AS NEEDED FOR WHEEZE OR SHORTNESS OF BREATH 18 each 1   atorvastatin (LIPITOR) 20 MG tablet TAKE 1 TABLET BY MOUTH EVERY DAY 90 tablet 1   bisoprolol (ZEBETA) 5 MG tablet TAKE 1 TABLET BY MOUTH EVERY DAY 90  tablet 1   busPIRone ([name]) 5 MG tablet TAKE 1 TABLET BY MOUTH TWICE A DAY 180 tablet 3   cholecalciferol (VITAMIN D) 1000 UNITS tablet Take 1,000 Units by mouth daily.     Cyanocobalamin (VITAMIN B-12 PO) Take 1 tablet by mouth daily.     levETIRAcetam (KEPPRA) 1000 MG tablet TAKE 1 TABLET BY MOUTH TWICE A DAY 180 tablet 1   loperamide (IMODIUM) 2 MG capsule Take 2 mg by mouth daily as needed for diarrhea or loose stools.      QUEtiapine (SEROQUEL) 25 MG tablet 1 TABLET BEDTIME 30 tablet 1   sertraline (ZOLOFT) 50 MG tablet TAKE 1 TABLET BY MOUTH EVERY DAY 90 tablet 1   Tiotropium Bromide-Olodaterol 2.5-2.5 MCG/ACT AERS Inhale 2 puffs into the lungs daily. 4 g 1   XARELTO 15  MG TABS tablet TAKE 1 TABLET BY MOUTH EVERY DAY WITH SUPPER 30 tablet 5   benzonatate (TESSALON) 100 MG capsule Take 1 capsule (100 mg total) by mouth 2 (two) times daily as needed for cough. 20 capsule 0   No current facility-administered medications for this visit.     Objective:  BP 130/60    Pulse 67    Temp (!) 97.3 F (36.3 C)    Ht 5\' 4"  (1.626 m)    Wt 170 lb 6.4 oz (77.3 kg)    LMP  (LMP Unknown)    SpO2 (!) 83%    BMI 29.25 kg/m without oxygen but she will resume oxygen at home Gen: NAD, resting comfortably CV: RRR stable murmur noted Lungs: CTAB no crackles, wheeze, rhonchi Abdomen: soft/nontender/nondistended/normal bowel sounds.  Ext: no edema Skin: warm, dry Neuro: Substantially confused-does not follow conversation today and speaks tangentially    Assessment and Plan   # ED F/U for COVID-19 Infection S: Patient was seen 08/07/2021 for COVID-19 with hypoxia-patient has been extremely fatigued at home-family was concerned about progressive dementia.  Patient has been treated at home with molnupiravir initially after diagnosis but family was having a difficult time getting her to take her medications consistently initially.  PCR have been positive in office and also in the ED.  Chest x-ray showed progressive likely COVID-19 related pneumonia.  Patient was treated with azithromycin.  She was also treated with steroids for COPD.  Ultimately patient was signed out AMA after 30 hours still being in the emergency department waiting on admission-plan was for home oxygen and palliative care consult as patient was having a lot of difficulty being comfortable in the emergency room and was even becoming combative.  It was certainly difficult for her to be without family in the emergency room.  It also sounds like she may have gone up.  Without being given IV fluids and was somewhat dehydrated.  In our virtual visit from 08/12/2021 , family reported continued improvement. She had started  to eat more and was drinking more finally. Patient did wake up with very sore throat and was basically whispering.  [facility] team recommended repeat chest x-ray within 1 week form encounter-we discussed pneumonia typically does not clear within this timeframe and possibly repeating within a month - In regards to oxygen on 5 L and oxygen at 96%-we discussed cutting back on oxygen and targeting between 90 and 95% oxygen  Since last visit patient has also been admitted to hospice after initial palliative care consult. Has had multiple visits including nursing, PT, clergy. They are going to send out help for personal care but she may  end up refusing this.    A month of diarrhea- history of issues after prior iloestomy and then later closure related to diverticulitis. Hospice has suggested imodium but she is declinging at times.   Today, physically she seems to be improving overall.She is still coughing a lot and coughing up a lot of phlegm still.  Overall seems to be improving-slightly more active.  Mentally she has not made a huge turnaround-family informs me she had actually had some decline even prior to COVID-end-stage dementia is actually the reason for admission to hospice it sounds like A/P: COVID pneumonia seems to be improving-lungs clear today -Still requiring oxygen at home-we are able to get oxygen levels up with oxygen -tessalon refill -converting to nebulizer with hospice instead of tiotropium-olodaterol seems like a reasonable choice -Chest x-ray planned in 1 month  See below about dementia  #Major neurocognitive disorder with behavioral disturbance S: Patient follows with Dr. Sheryle Hail April 2022. Patient also with a tremor and some question of Parkinson's disease-for this reason I have been hesitant to use Haldol but she did well with this in the hospital. History of stroke so vascular dementia could play a role as well.   Patient on combination of Zoloft and Seroquel helping  somewhat with agitation.  She did well with Haldol in the hospital but with question of Parkinson's I would like to hold off if possible  Worsening issues after COVID A/P: Memory substantially declined after COVID with baseline major neurocognitive disorder/dementia and apparently Declined Prior to That and As above Now on Hospice Related to This.  Appetite Has Seemed to Improve Though.  We Discussed Possibility of "Graduating Hospice" in the Future if she were to make more significant turnaround   For agitation/depression-depression only in partial remission per PHQ-9 but this is likely related to recent illness.  Still seems to do reasonably well with Zoloft and Seroquel  # Atrial fibrillation with history of cardioembolic stroke S: Rate controlled with bisoprolol 5 mg Anticoagulated with Xarelto 15 mg A/P: Appropriately anticoagulated and rate controlled-continue current medication. 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 Tomi Likens S:Patient has been compliant with Keppra 1000 mg twice daily through Dr. Tomi Likens with no recurrence of seizures  A/P: Doing well-continue current medication  # COPD with hypoxia after COVID-19 pneumonia but honestly with low oxygen levels in the past and had declined oxygen S: Patient reports stable shortness of breath and cough  Maintenance medications: Being converted to nebulizer from tiotropium .-olodaterol it sounds like-I do not know the exact name of medication.  She is using at least 2 L of oxygen at home A/P: No wheeze on exam today.  Still with productive cough but is finished both antibiotics and steroids  #Heart failure with preserved ejection fraction-reported by Dr. Gwenlyn Found 05/10/2018 S: Medication:None.  No recent edema or weight gain-actually has  lost some weight A/P: Appears euvolemic-continue current medication   #Aortic atherosclerosis as well as history of cardioembolic stroke and question TIA- LDL goal under 70 #hyperlipidemia S: Medication:Atorvastatin 20 mg Lab Results  Component Value Date   CHOL 96 04/20/2019   HDL 30.80 (L) 04/20/2019   LDLCALC 90 08/07/2014   LDLDIRECT 40.0 04/20/2019   TRIG 266.0 (H) 04/20/2019   CHOLHDL 3 04/20/2019  A/P: Hospice would like to stop this medication  but family would prefer to continue it due to history of stroke.  We discussed today potential reasoning for removing medication but they would like to continue for now  #Chronic kidney disease stage III-creatinine actually in upper 50s on last check largely reassuring-continue to monitor  #Lab work today-patient declined this.  Could recheck magnesium as well as CBC and CMP but she/family would like to minimize labs at present especially with transition to hospice  Recommended follow up: Return in about 1 month (around 09/21/2021) for follow up- or sooner if needed. Future Appointments  Date Time Provider [place]  09/25/2021  2:20 PM Marin Olp, MD [facility]  01/13/2022  2:30 PM Pieter Partridge, DO [facility] None  04/10/2022  3:15 PM [facility] HEALTH COACH [facility]    Lab/Order associations:   ICD-10-CM   1. Pneumonia due to COVID-19 virus  U07.1 CANCELED: DG Chest 2 View   J12.82     2. Stage 3b chronic kidney disease (HCC)  N18.32     3. Aortic atherosclerosis (HCC)  I70.0     4. Diastolic congestive heart failure, unspecified HF chronicity (HCC)  I50.30     5. Recurrent major depressive disorder, in full remission ([place])  F33.42     6. Acute respiratory failure with hypoxia (HCC)  J96.01     7. AF (paroxysmal atrial fibrillation) (HCC)  I48.0     8. Major neurocognitive disorder ([place])  F03.90     9. Essential hypertension  I10     10. Hyperlipidemia, unspecified hyperlipidemia type  E78.5        Meds ordered this encounter  Medications   benzonatate (TESSALON) 100 MG capsule    Sig: Take 1 capsule (100 mg total) by mouth 2 (two) times daily as needed for cough.    Dispense:  20 capsule    Refill:  0   I,Harris Phan,acting as a scribe for [name], MD.,have documented all relevant documentation on the behalf of [name], MD,as directed by  [name], MD while in the presence of [name], MD.    I, [name], MD, have reviewed all documentation for this visit. The documentation on 08/21/21 for the exam, diagnosis, procedures, and orders are all accurate and complete.   Return precautions advised.  [name], MD

## 2021-08-21 NOTE — Patient Instructions (Addendum)
Please go to [place] [facility]  central X-ray - sometime around 09/12/2021 - located [address] across the street from [place] - in the basement - Hours: 8:30-5:00 PM M-F (with lunch from 12:30- 1 PM). You do NOT need an appointment.  - Please ensure you are covid symptom free before going in(No fever, chills, cough, congestion, runny nose, shortness of breath, fatigue, body aches, sore throat, headache, nausea, vomiting, diarrhea, or new loss of taste or smell. No known contacts with covid 19 or someone being tested for covid 19)  I have sent in some cough medication at your local pharmacy - please only use this when your cough gives you discomfort.  In regards to your phlegm, I think mucinex is reasonable to continue and to just to continue eject sputum when you cough.  Recommended follow up: Return in about 1 month (around 09/21/2021) for follow up- or sooner if needed.

## 2021-08-22 ENCOUNTER — Telehealth: Payer: Self-pay

## 2021-08-22 NOTE — Telephone Encounter (Signed)
Called and lm on [place] confidential vm with VO. Made aware pt was seen in office yesterday and depression screen was done at that time.
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
## 2021-08-29 ENCOUNTER — Telehealth: Payer: Self-pay

## 2021-08-29 NOTE — Telephone Encounter (Signed)
Type of form received: [place] Certification   Form should be Faxed to: [place] [phone]  Order # [id number]   Form placed:  [facility]

## 2021-09-06 ENCOUNTER — Other Ambulatory Visit: Payer: Self-pay | Admitting: Family Medicine

## 2021-09-09 ENCOUNTER — Encounter: Payer: Self-pay | Admitting: Physician Assistant

## 2021-09-09 ENCOUNTER — Other Ambulatory Visit: Payer: Self-pay

## 2021-09-09 ENCOUNTER — Ambulatory Visit: Payer: Medicare Other | Admitting: Physician Assistant

## 2021-09-09 VITALS — BP 118/82 | HR 70 | Temp 97.4°F | Ht 64.0 in | Wt 169.6 lb

## 2021-09-09 DIAGNOSIS — H6123 Impacted cerumen, bilateral: Secondary | ICD-10-CM

## 2021-09-09 NOTE — Patient Instructions (Signed)
It was great to see you!  May use cotton ball soaked in mineral oil into ear 10-20 min per week if having recurrent ear wax accumulation. May use dilute hydrogen peroxide (equal parts this and water) and place a few drops into ear every 2 weeks to help break down wax buildup.  Follow-up with [place] as needed.  Take care,  Inda Coke PA-C

## 2021-09-09 NOTE — Progress Notes (Signed)
[name] is a 88 y.o. female here for a recurrence of a previously resolved problem.   History of Present Illness:   Chief Complaint  Patient presents with   Ear Fullness    Pt c/o of ears needing to be cleaned out;    HPI  Bilateral cerumen impaction Patient reports complaint of bilateral hearing loss. Needs her ears cleaned out. Denies: pain, fever, chills, drainage from her ears, headache  Husband tries to clean out her ears occasionally with hydrogen peroxide. Has had to ear lavage performed in our office in the past.   Past Medical History:  Diagnosis Date   ABDOMINAL INCISIONAL HERNIA 04/03/2010       Anxiety    Arthritis    "lots; hands" (05/26/2013)   Blood transfusion    "w/1st miscarriage" (05/26/2013)   COPD (chronic obstructive pulmonary disease) (HCC)    Coronary artery disease    Depression    Diverticulitis of colon with hemorrhage 07/24/2008   DVT (deep venous thrombosis) ([place]) 1954   "after I had my first child" (05/26/2013)   Exertional shortness of breath    Family history of anesthesia complication    "sisters also get sick as a dog" (05/26/2013)   GERD 05/03/2007       GERD (gastroesophageal reflux disease)    H/O hiatal hernia    HCAP (healthcare-associated pneumonia) 06/26/2013   Heart murmur    "when I was a child" (05/26/2013)   History of TIAs    Hyperlipidemia    Hypertension    Keratotic plaque    Laryngeal carcinoma (HCC) hx of ca   remotely with resection and xrt/chronic hoarsemenss   Lymphona, mantle cell, inguinal region/lower limb ([place])    "large c-cell" (05/26/2013)   OSTEOARTHRITIS 05/03/2007   PEPTIC ULCER DISEASE 07/06/2008   Pneumonia 2013   "once" (05/26/2013)   PUD (peptic ulcer disease)    PVD (peripheral vascular disease) ([place])    Seizures ([place])    Stroke ([place]) 05/26/2013   "very mild; affected my speech for a while" (05/26/2013)   VARICOSE VEINS LOWER EXTREMITIES W/INFLAMMATION 01/15/2009     Social  History   Tobacco Use   Smoking status: Former    Packs/day: 2.50    Years: 37.00    Pack years: 92.50    Types: Cigarettes    Quit date: 09/05/1984    Years since quitting: 37.0   Smokeless tobacco: Never   Tobacco comments:    quit when she was dx with cancer  Vaping Use   Vaping Use: Never used  Substance Use Topics   Alcohol use: No    Alcohol/week: 0.0 standard drinks   Drug use: No    Past Surgical History:  Procedure Laterality Date   ABDOMINAL EXPLORATION SURGERY  12/01/2012   APPENDECTOMY  09/13/2008   CAROTID ENDARTERECTOMY Bilateral 2004   2004 a month apart right and left   CARPAL TUNNEL RELEASE Left ?1960's   CATARACT EXTRACTION  2005   bilateral   COLON SURGERY  09/13/2008   Laparoscopic assisted converted to open sigmoid colectomy.Archie Endo 09/13/2008 (05/26/2013)   DILATION AND CURETTAGE OF UTERUS  1950's   "had 2 for miscarriages" (05/26/2013)   DIVERTING ILEOSTOMY  09/22/2008   iliostomy for diverticulitis/notes 09/22/2008 (05/26/2013)   HERNIA REPAIR  05/2010   ventral hernia repair   ILEOSTOMY CLOSURE  01/2010   RADICAL NECK DISSECTION  1986   "larynx cancer" (05/26/2013)   TONSILLECTOMY AND ADENOIDECTOMY  1936   "tonsils grew  back; no 2nd OR" (05/26/2013)   TOTAL KNEE ARTHROPLASTY Bilateral    2002 left 2008 right   TUBAL LIGATION  1972    Family History  Problem Relation Age of Onset   Colon cancer Sister        colon   Hyperlipidemia Mother    Stroke Mother    Cancer Father        mesothelioma    Allergies  Allergen Reactions   Ativan [Lorazepam] Other (See Comments)    Severe hallucinations    Gabapentin     encephalopathy   Propoxyphene Hcl Nausea And Vomiting   Codeine Itching   Penicillins Rash    Has patient had a PCN reaction causing immediate rash, facial/tongue/throat swelling, SOB or lightheadedness with hypotension: Yes Has patient had a PCN reaction causing severe rash involving mucus membranes or skin necrosis: Unk Has  patient had a PCN reaction that required hospitalization: No Has patient had a PCN reaction occurring within the last 10 years: Yes If all of the above answers are "NO", then may proceed with Cephalosporin use.    Vancomycin Rash    Current Medications:   Current Outpatient Medications:    albuterol (VENTOLIN HFA) 108 (90 Base) MCG/ACT inhaler, TAKE 2 PUFFS BY MOUTH EVERY 6 HOURS AS NEEDED FOR WHEEZE OR SHORTNESS OF BREATH, Disp: 18 each, Rfl: 1   atorvastatin (LIPITOR) 20 MG tablet, TAKE 1 TABLET BY MOUTH EVERY DAY, Disp: 90 tablet, Rfl: 1   benzonatate (TESSALON) 100 MG capsule, Take 1 capsule (100 mg total) by mouth 2 (two) times daily as needed for cough., Disp: 20 capsule, Rfl: 0   bisoprolol (ZEBETA) 5 MG tablet, TAKE 1 TABLET BY MOUTH EVERY DAY, Disp: 90 tablet, Rfl: 1   busPIRone ([name]) 5 MG tablet, TAKE 1 TABLET BY MOUTH TWICE A DAY, Disp: 180 tablet, Rfl: 3   cholecalciferol (VITAMIN D) 1000 UNITS tablet, Take 1,000 Units by mouth daily., Disp: , Rfl:    Cyanocobalamin (VITAMIN B-12 PO), Take 1 tablet by mouth daily., Disp: , Rfl:    levETIRAcetam (KEPPRA) 1000 MG tablet, TAKE 1 TABLET BY MOUTH TWICE A DAY, Disp: 180 tablet, Rfl: 1   loperamide (IMODIUM) 2 MG capsule, Take 2 mg by mouth daily as needed for diarrhea or loose stools. , Disp: , Rfl:    QUEtiapine (SEROQUEL) 25 MG tablet, 1 TABLET BEDTIME, Disp: 30 tablet, Rfl: 1   sertraline (ZOLOFT) 50 MG tablet, TAKE 1 TABLET BY MOUTH EVERY DAY, Disp: 90 tablet, Rfl: 1   Tiotropium Bromide-Olodaterol 2.5-2.5 MCG/ACT AERS, Inhale 2 puffs into the lungs daily., Disp: 4 g, Rfl: 1   XARELTO 15 MG TABS tablet, TAKE 1 TABLET BY MOUTH EVERY DAY WITH SUPPER, Disp: 30 tablet, Rfl: 5   Review of Systems:   ROS Negative unless otherwise specified per HPI.   Vitals:   Vitals:   09/09/21 1129  Pulse: 70  Temp: (!) 97.4 F (36.3 C)  TempSrc: Temporal  SpO2: 98%  Weight: 169 lb 9.6 oz (76.9 kg)  Height: 5\' 4"  (1.626 m)     Body  mass index is 29.11 kg/m.  Physical Exam:   Physical Exam Constitutional:      Appearance: Normal appearance. She is well-developed.  HENT:     Head: Normocephalic and atraumatic.     Right Ear: There is impacted cerumen.     Left Ear: There is impacted cerumen.  Eyes:     General: Lids are normal.  Extraocular Movements: Extraocular movements intact.     Conjunctiva/sclera: Conjunctivae normal.  Pulmonary:     Effort: Pulmonary effort is normal.  Musculoskeletal:        General: Normal range of motion.     Cervical back: Normal range of motion and neck supple.  Skin:    General: Skin is warm and dry.  Neurological:     Mental Status: She is alert and oriented to person, place, and time.  Psychiatric:        Attention and Perception: Attention and perception normal.        Mood and Affect: Mood normal.        Behavior: Behavior normal.        Thought Content: Thought content normal.        Judgment: Judgment normal.   Ceruminosis is noted.  Wax is removed by syringing and manual debridement.   Assessment and Plan:   Bilateral impacted cerumen Patient tolerated lavage well No evidence of infection/inflammation on my exam warranting further mgmt Wax build-up prevent discussed; handout provided  Inda Coke, PA-C

## 2021-09-15 NOTE — Progress Notes (Signed)
Phone [phone] In person visit   Subjective:   [name] is a 88 y.o. year old very pleasant female patient who presents for/with See problem oriented charting Chief Complaint  Patient presents with   Follow-up   Hypertension   COPD   Depression   Cough    Pt c/o cough getting worse.    This visit occurred during the SARS-CoV-2 public health emergency.  Safety protocols were in place, including screening questions prior to the visit, additional usage of staff PPE, and extensive cleaning of exam room while observing appropriate contact time as indicated for disinfecting solutions.   Past Medical History-  Patient Active Problem List   Diagnosis Date Noted   Major neurocognitive disorder ([place]) 02/24/2021    Priority: High   Memory loss 12/14/2019    Priority: High   Diastolic CHF ([place]) 41/96/2229    Priority: High   Acute respiratory failure with hypoxia ([place]) 11/19/2015    Priority: High   History of cardioembolic stroke 79/89/2119    Priority: High   Spinal stenosis, lumbar region, with neurogenic claudication 05/09/2014    Priority: High   History of Focal seizure ([place]) 06/16/2013    Priority: High   History of TIA (transient ischemic attack) 05/31/2013    Priority: High   History of Large B-cell lymphoma  02/18/2012    Priority: High   AF (paroxysmal atrial fibrillation) ([place]) 12/14/2008    Priority: High   Mild aortic stenosis 08/26/2020    Priority: Medium    Recurrent major depressive disorder, in full remission ([place]) 10/16/2019    Priority: Medium    Agitation 03/11/2018    Priority: Medium    History of chronic respiratory failure- prior on o2 06/15/2017    Priority: Medium    Bacteremia due to Streptococcus 11/21/2015    Priority: Medium    Cellulitis of leg, right 11/20/2015    Priority: Medium    Diverticulitis of colon 09/26/2014    Priority: Medium    CKD (chronic kidney disease), stage III ([place]) 09/26/2014    Priority: Medium     Laryngeal carcinoma ([place])     Priority: Medium    Anxiety state 10/20/2008    Priority: Medium    Depression 10/20/2008    Priority: Medium    Dyspnea on exertion 07/06/2008    Priority: Medium    COPD GOLD II 03/26/2008    Priority: Medium    GERD (gastroesophageal reflux disease) 05/03/2007    Priority: Medium    Hyperlipidemia 03/18/2007    Priority: Medium    Essential hypertension 03/18/2007    Priority: Medium    Loose stools 06/01/2019    Priority: Low   Hemorrhoids 06/01/2019    Priority: Low   Chronic diarrhea 09/04/2017    Priority: Low   Aortic atherosclerosis ([place]) 06/30/2016    Priority: Low   Weakness generalized 06/30/2013    Priority: Low   Bilateral hand pain 01/09/2013    Priority: Low   COVID-19 08/09/2021   Dementia with behavioral disturbance 08/09/2021   Acute conjunctivitis of right eye 06/13/2021   Pneumonia 06/26/2013    Medications- reviewed and updated Current Outpatient Medications  Medication Sig Dispense Refill   albuterol (VENTOLIN HFA) 108 (90 Base) MCG/ACT inhaler TAKE 2 PUFFS BY MOUTH EVERY 6 HOURS AS NEEDED FOR WHEEZE OR SHORTNESS OF BREATH 18 each 1   atorvastatin (LIPITOR) 20 MG tablet TAKE 1 TABLET BY MOUTH EVERY DAY 90 tablet 1   bisoprolol (ZEBETA)  5 MG tablet TAKE 1 TABLET BY MOUTH EVERY DAY 90 tablet 1   busPIRone ([name]) 5 MG tablet TAKE 1 TABLET BY MOUTH TWICE A DAY 180 tablet 3   cholecalciferol (VITAMIN D) 1000 UNITS tablet Take 1,000 Units by mouth daily.     Cyanocobalamin (VITAMIN B-12 PO) Take 1 tablet by mouth daily.     levETIRAcetam (KEPPRA) 1000 MG tablet TAKE 1 TABLET BY MOUTH TWICE A DAY 180 tablet 1   loperamide (IMODIUM) 2 MG capsule Take 2 mg by mouth daily as needed for diarrhea or loose stools.      QUEtiapine (SEROQUEL) 25 MG tablet 1 TABLET BEDTIME 30 tablet 1   sertraline (ZOLOFT) 50 MG tablet TAKE 1 TABLET BY MOUTH EVERY DAY 90 tablet 1   XARELTO 15 MG TABS tablet TAKE 1 TABLET BY MOUTH EVERY DAY WITH  SUPPER 30 tablet 5   benzonatate (TESSALON) 100 MG capsule Take 1 capsule (100 mg total) by mouth 2 (two) times daily as needed for cough. 20 capsule 2   No current facility-administered medications for this visit.     Objective:  BP 130/70    Pulse 85    Temp 97.7 F (36.5 C)    Ht 5\' 4"  (1.626 m)    Wt 169 lb (76.7 kg)    LMP  (LMP Unknown)    SpO2 (!) 89%    BMI 29.01 kg/m  Gen: NAD, resting comfortably CV: RRR no murmurs rubs or gallops Lungs: CTAB no crackles, wheeze, rhonchi- minimal deep respiratory effort Ext: minimal edema Skin: warm, dry Neuro: has a hard time stringing together consistent coughs    Assessment and Plan    # COVID-19-patient with COVID-19 pneumonia in January 2023.  She was improving and August 21, 2021 visit.  Worsening cognitive ability was noted at that time.  She also been admitted to hospice for end-stage dementia-they converted her to a home nebulizer instead of 2 Tropium-olodaterol.  Had repeat chest x-ray yesterday-bilateral opacities appear improved to me-will await radiology overread -Note she has had a chronic cough also with known COPD-if pneumonia has cleared on chest x-ray could consider course of prednisone but gets irritability with this . On other hand has been missing some doses of nebulizer (sound slike just albuterol)- encouraged more consistency- that may be key (no longer on stiolto while on hospice- I wonder if she will need both back)   #Major neurocognitive disorder S: Patient with worsening memory function after COVID.  She was admitted to hospice under dementia-we had discussed the possibility of "graduating hospice" if she were to make significant turnaround.  She was continued on sertraline as well as Seroquel which had been suggested by Dr. 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 Tomi Likens S:Patient been compliant with Keppra 1000 mg twice daily through Dr. Tomi Likens with no recurrence of seizures  A/P: doing well without seizures- continue current med   # COPD  S: Patient reported stable shortness of breath and cough  Maintenance medications: off stiolto as  on hospice- now only using albuterol  nebtwice a day- does better on this but not using consistently. Has oxygen at home and uses if more fatigued appearing and below 88%- today was at 89%  A/P: reasonably stable- continue current meds  #Heart failure with preserved ejection fraction-reported by Dr. Alvester Chou 05/10/2018 S: Medication:None.  Edema: no edema Weight gain: none Shortness of breath: largely stable A/P: appears euvolemic- continue curent meds   # Depression/agitation S: Medication:Zoloft 50 mg, buspirone 5 mg twice daily-he typically just takes once daily . Also has haldol for emergency use- only used once first night and has not needed since then A/P: reasonably stable/improved per family perspective- family states actually somewhat better further out from covid  #Aortic atherosclerosis- LDL goal under 70 #hyperlipidemia S: Medication:Atorvastatin 20 mg Lab Results  Component Value Date   CHOL 96 04/20/2019   HDL 30.80 (L) 04/20/2019   LDLCALC 90 08/07/2014   LDLDIRECT 40.0 04/20/2019   TRIG 266.0 (H) 04/20/2019   CHOLHDL 3 04/20/2019   A/P: we have opted not to repeat labs but LDL goal under 70 and was at goal last visit.   #History of Laryngeal carcinoma (no code noted for history  of). Remote history with resection in 1986 and later radiation-chronic hoarseness related to this. No evidence of recurrence   #diarrhea- actually does better on fiber. Also not taking immodium- no worsening issues though.   Recommended follow up: Return in about 3 months (around 12/23/2021) for follow up- or sooner if needed. Future Appointments  Date Time Provider [place]  01/13/2022  2:30 PM Pieter Partridge, DO [facility] None  04/10/2022  3:15 PM [facility] HEALTH COACH [facility]    Lab/Order associations:   ICD-10-CM   1. Essential hypertension  I10     2. Major neurocognitive disorder ([place])  F03.90     3. Atrial fibrillation, unspecified type ([place])  I48.91     4. COPD GOLD II  J44.9     5. Recurrent major depressive disorder, in partial remission ([place])  F33.41     6. Aortic atherosclerosis (H[facility])  I70.0     7. 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 (H[facility]) Chronic G40.109     8. Hyperlipidemia, unspecified hyperlipidemia type  E78.5      Meds ordered this encounter  Medications   benzonatate (TESSALON) 100 MG capsule    Sig: Take 1 capsule (100 mg total) by mouth 2 (two) times daily as needed for cough.    Dispense:  20 capsule    Refill:  2   I,Jada Bradford,acting as a scribe for [name], MD.,have documented all relevant documentation on the behalf of [name], MD,as directed by  [name], MD while in the presence of [name], MD.  I, [name], MD, have reviewed all documentation for this visit. The documentation on 09/25/21 for the exam, diagnosis, procedures, and orders are all accurate and complete.  Return precautions advised.  [name], MD

## 2021-09-24 ENCOUNTER — Other Ambulatory Visit: Payer: Self-pay

## 2021-09-24 ENCOUNTER — Other Ambulatory Visit: Payer: Self-pay | Admitting: Physician Assistant

## 2021-09-24 ENCOUNTER — Ambulatory Visit (INDEPENDENT_AMBULATORY_CARE_PROVIDER_SITE_OTHER)
Admission: RE | Admit: 2021-09-24 | Discharge: 2021-09-24 | Disposition: A | Discharge status: Home / Self Care | Source: Ambulatory Visit | Attending: Family Medicine | Admitting: Family Medicine

## 2021-09-24 ENCOUNTER — Telehealth: Payer: Self-pay | Admitting: Family Medicine

## 2021-09-24 DIAGNOSIS — U071 COVID-19: Secondary | ICD-10-CM

## 2021-09-24 DIAGNOSIS — J1282 Pneumonia due to coronavirus disease 2019: Secondary | ICD-10-CM | POA: Diagnosis not present

## 2021-09-24 NOTE — Telephone Encounter (Signed)
Chest xray has been ordered, called and made pt daughter aware.

## 2021-09-24 NOTE — Telephone Encounter (Signed)
Pt's daughter states pt was supposed to have a chest xray before her next appt with [facility]. There is no order for a chest xray and her appt is tomorrow. Please Advise.

## 2021-09-25 ENCOUNTER — Ambulatory Visit: Payer: Medicare Other | Admitting: Family Medicine

## 2021-09-25 ENCOUNTER — Encounter: Payer: Self-pay | Admitting: Family Medicine

## 2021-09-25 VITALS — BP 130/70 | HR 85 | Temp 97.7°F | Ht 64.0 in | Wt 169.0 lb

## 2021-09-25 DIAGNOSIS — F0390 Unspecified dementia without behavioral disturbance: Secondary | ICD-10-CM | POA: Diagnosis not present

## 2021-09-25 DIAGNOSIS — I10 Essential (primary) hypertension: Secondary | ICD-10-CM | POA: Diagnosis not present

## 2021-09-25 DIAGNOSIS — I4891 Unspecified atrial fibrillation: Secondary | ICD-10-CM | POA: Diagnosis not present

## 2021-09-25 DIAGNOSIS — F3341 Major depressive disorder, recurrent, in partial remission: Secondary | ICD-10-CM

## 2021-09-25 DIAGNOSIS — I700 Atherosclerosis of aorta: Secondary | ICD-10-CM

## 2021-09-25 DIAGNOSIS — E785 Hyperlipidemia, unspecified: Secondary | ICD-10-CM

## 2021-09-25 DIAGNOSIS — J449 Chronic obstructive pulmonary disease, unspecified: Secondary | ICD-10-CM | POA: Diagnosis not present

## 2021-09-25 DIAGNOSIS — G40109 Localization-related (focal) (partial) symptomatic epilepsy and epileptic syndromes with simple partial seizures, not intractable, without status epilepticus: Secondary | ICD-10-CM

## 2021-09-25 MED ORDER — BENZONATATE 100 MG PO CAPS: 100.0000 mg | ORAL_CAPSULE | Freq: Two times a day (BID) | ORAL | 2 refills | Status: DC | PRN

## 2021-09-25 NOTE — Patient Instructions (Addendum)
Thrilled with improvement- continue current meds BUT try to be more consistent with albuterol- if she does graduate from hospice may consider going back to stiolto- I think she was doing better from respiratory perspective on that.   Can use tessalon  if needed for cough  Recommended follow up: Return in about 3 months (around 12/23/2021) for follow up- or sooner if needed.

## 2021-09-26 ENCOUNTER — Telehealth: Payer: Self-pay | Admitting: Family Medicine

## 2021-09-26 NOTE — Telephone Encounter (Signed)
We actually did discuss this-patient did not want to change her regimen or take any suggestions such as Imodium or Metamucil trial.  I cannot force her to be compliant with diet or fiber Gummies

## 2021-09-26 NOTE — Telephone Encounter (Signed)
Returned the call to [place] and below message given.

## 2021-09-26 NOTE — Telephone Encounter (Signed)
Pt has Chronic Loose Stools. Ria Comment would like to discuss with Dr. Yong Channel Possible solutions or medicine changes. Please call Ria Comment at [phone].

## 2021-09-26 NOTE — Telephone Encounter (Signed)
Called [name] Comment back and she states that pt has chronic loose stools that were supposed to be discussed yesterday and she is not incontinent which is becoming an issues. They have metamucil and pt does not like it, also fiber gummies that she will take and some time wont take. [name] Comment would like to know if you have any more recommendations as to what can be tried to bulk up her stool. She only has1-2 loose stool daily.

## 2021-10-16 ENCOUNTER — Telehealth: Payer: Self-pay

## 2021-10-16 NOTE — Telephone Encounter (Signed)
Is requesting last OV notes.  States she believes its time for patient to go through recert and needs to compare notes.    Please follow up with Pamala Hurry in regard.

## 2021-10-16 NOTE — Telephone Encounter (Signed)
Called and spoke with [name], last OV note has been faxed to [phone]. ?

## 2021-11-01 ENCOUNTER — Other Ambulatory Visit: Payer: Self-pay | Admitting: Family Medicine

## 2021-11-04 ENCOUNTER — Ambulatory Visit (INDEPENDENT_AMBULATORY_CARE_PROVIDER_SITE_OTHER): Payer: Medicare Other | Admitting: Podiatry

## 2021-11-04 ENCOUNTER — Encounter: Payer: Self-pay | Admitting: Podiatry

## 2021-11-04 DIAGNOSIS — B351 Tinea unguium: Secondary | ICD-10-CM | POA: Diagnosis not present

## 2021-11-04 DIAGNOSIS — M79675 Pain in left toe(s): Secondary | ICD-10-CM | POA: Diagnosis not present

## 2021-11-04 DIAGNOSIS — N183 Chronic kidney disease, stage 3 unspecified: Secondary | ICD-10-CM

## 2021-11-04 DIAGNOSIS — M2041 Other hammer toe(s) (acquired), right foot: Secondary | ICD-10-CM

## 2021-11-04 DIAGNOSIS — M79674 Pain in right toe(s): Secondary | ICD-10-CM | POA: Diagnosis not present

## 2021-11-04 NOTE — Progress Notes (Signed)
This patient returns to my office for at risk foot care.  This patient requires this care by a professional since this patient will be at risk due to having chronic kidney disease and coagulation defect .  Patient is taking xarelto.  This patient is unable to cut nails herself since the patient cannot reach her nails.These nails are painful walking and wearing shoes.  This patient presents for at risk foot care today. ? ?General Appearance  Alert, conversant and in no acute stress. ? ?Vascular  Dorsalis pedis and posterior tibial  pulses are weakly  palpable  bilaterally.  Capillary return is within normal limits  bilaterally. Temperature is within normal limits  bilaterally. ? ?Neurologic  Senn-Weinstein monofilament wire test within normal limits  bilaterally. Muscle power within normal limits bilaterally. ? ?Nails Thick disfigured discolored nails with subungual debris  from hallux to fifth toes bilaterally. No evidence of bacterial infection or drainage bilaterally. ? ?Orthopedic  No limitations of motion  feet .  No crepitus or effusions noted.  No bony pathology .  Hammer toes  B/l. ? ?Skin  normotropic skin with no porokeratosis noted bilaterally.  No signs of infections or ulcers noted.    ? ?Onychomycosis  Pain in right toes  Pain in left toes ? ?Consent was obtained for treatment procedures.   Mechanical debridement of nails 1-5  bilaterally performed with a nail nipper.  Filed with dremel without incident.  ? ? ?Return office visit  prn               Told patient to return for periodic foot care and evaluation due to potential at risk complications. ? ? ?Gardiner Barefoot DPM   ?

## 2021-11-28 ENCOUNTER — Other Ambulatory Visit: Payer: Self-pay | Admitting: Neurology

## 2021-12-26 ENCOUNTER — Ambulatory Visit (INDEPENDENT_AMBULATORY_CARE_PROVIDER_SITE_OTHER): Admitting: Family Medicine

## 2021-12-26 ENCOUNTER — Encounter: Payer: Self-pay | Admitting: Family Medicine

## 2021-12-26 VITALS — BP 116/82 | HR 77 | Temp 99.0°F | Ht 64.0 in | Wt 157.6 lb

## 2021-12-26 DIAGNOSIS — C329 Malignant neoplasm of larynx, unspecified: Secondary | ICD-10-CM

## 2021-12-26 DIAGNOSIS — I10 Essential (primary) hypertension: Secondary | ICD-10-CM

## 2021-12-26 DIAGNOSIS — F3342 Major depressive disorder, recurrent, in full remission: Secondary | ICD-10-CM

## 2021-12-26 DIAGNOSIS — I480 Paroxysmal atrial fibrillation: Secondary | ICD-10-CM

## 2021-12-26 DIAGNOSIS — I5030 Unspecified diastolic (congestive) heart failure: Secondary | ICD-10-CM

## 2021-12-26 DIAGNOSIS — E785 Hyperlipidemia, unspecified: Secondary | ICD-10-CM

## 2021-12-26 NOTE — Patient Instructions (Addendum)
Consider Tdap and shingrix at pharmacy. Decline bone density  This is one of your most stable/good visits- really happy for you  Maybe update bloodwork next visit sine you wanted to hold off today  Recommended follow up: Return in about 3 months (around 03/28/2022) for followup or sooner if needed.Schedule b4 you leave.

## 2021-12-26 NOTE — Progress Notes (Signed)
Phone [phone] In person visit   Subjective:   [name] is a 88 y.o. year old very pleasant female patient who presents for/with See problem oriented charting Chief Complaint  Patient presents with   Cough   Hypertension   COPD   Depression   Dementia    Past Medical History-  Patient Active Problem List   Diagnosis Date Noted   Major neurocognitive disorder ([place]) 02/24/2021    Priority: High   Memory loss 12/14/2019    Priority: High   Diastolic CHF ([place]) 29/93/7169    Priority: High   Acute respiratory failure with hypoxia ([place]) 11/19/2015    Priority: High   History of cardioembolic stroke 67/89/3810    Priority: High   Spinal stenosis, lumbar region, with neurogenic claudication 05/09/2014    Priority: High   History of Focal seizure ([place]) 06/16/2013    Priority: High   History of TIA (transient ischemic attack) 05/31/2013    Priority: High   History of Large B-cell lymphoma  02/18/2012    Priority: High   AF (paroxysmal atrial fibrillation) ([place]) 12/14/2008    Priority: High   Mild aortic stenosis 08/26/2020    Priority: Medium    Recurrent major depressive disorder, in full remission ([place]) 10/16/2019    Priority: Medium    Agitation 03/11/2018    Priority: Medium    History of chronic respiratory failure- prior on o2 06/15/2017    Priority: Medium    Bacteremia due to Streptococcus 11/21/2015    Priority: Medium    Cellulitis of leg, right 11/20/2015    Priority: Medium    Diverticulitis of colon 09/26/2014    Priority: Medium    CKD (chronic kidney disease), stage III ([place]) 09/26/2014    Priority: Medium    Laryngeal carcinoma ([place])     Priority: Medium    Anxiety state 10/20/2008    Priority: Medium    Depression 10/20/2008    Priority: Medium    Dyspnea on exertion 07/06/2008    Priority: Medium    COPD GOLD II 03/26/2008    Priority: Medium    GERD (gastroesophageal reflux disease) 05/03/2007    Priority: Medium     Hyperlipidemia 03/18/2007    Priority: Medium    Essential hypertension 03/18/2007    Priority: Medium    Loose stools 06/01/2019    Priority: Low   Hemorrhoids 06/01/2019    Priority: Low   Chronic diarrhea 09/04/2017    Priority: Low   Aortic atherosclerosis ([place]) 06/30/2016    Priority: Low   Weakness generalized 06/30/2013    Priority: Low   Bilateral hand pain 01/09/2013    Priority: Low   COVID-19 08/09/2021   Dementia with behavioral disturbance ([place]) 08/09/2021   Acute conjunctivitis of right eye 06/13/2021   Pneumonia 06/26/2013    Medications- reviewed and updated Current Outpatient Medications  Medication Sig Dispense Refill   albuterol (VENTOLIN HFA) 108 (90 Base) MCG/ACT inhaler TAKE 2 PUFFS BY MOUTH EVERY 6 HOURS AS NEEDED FOR WHEEZE OR SHORTNESS OF BREATH 18 each 1   atorvastatin (LIPITOR) 20 MG tablet TAKE 1 TABLET BY MOUTH EVERY DAY 90 tablet 1   benzonatate (TESSALON) 100 MG capsule Take 1 capsule (100 mg total) by mouth 2 (two) times daily as needed for cough. 20 capsule 2   bisoprolol (ZEBETA) 5 MG tablet TAKE 1 TABLET BY MOUTH EVERY DAY 90 tablet 1   busPIRone ([name]) 5 MG tablet TAKE 1 TABLET BY MOUTH TWICE A  DAY 180 tablet 3   cholecalciferol (VITAMIN D) 1000 UNITS tablet Take 1,000 Units by mouth daily.     Cyanocobalamin (VITAMIN B-12 PO) Take 1 tablet by mouth daily.     levETIRAcetam (KEPPRA) 1000 MG tablet TAKE 1 TABLET BY MOUTH TWICE A DAY 180 tablet 1   loperamide (IMODIUM) 2 MG capsule Take 2 mg by mouth daily as needed for diarrhea or loose stools.      QUEtiapine (SEROQUEL) 25 MG tablet 1 TABLET BEDTIME 30 tablet 1   sertraline (ZOLOFT) 50 MG tablet TAKE 1 TABLET BY MOUTH EVERY DAY 90 tablet 1   XARELTO 15 MG TABS tablet TAKE 1 TABLET BY MOUTH EVERY DAY WITH SUPPER 30 tablet 5   No current facility-administered medications for this visit.     Objective:  BP 116/82   Pulse 77   Temp 99 F (37.2 C)   Ht '5\' 4"'$  (1.626 m)   Wt 157 lb 9.6 oz  (71.5 kg)   LMP  (LMP Unknown)   SpO2 94%   BMI 27.05 kg/m  Gen: NAD, resting comfortably Hoarse per baseline CV: RRR stable murmur Lungs: CTAB no crackles, wheeze, rhonchi Ext: no edema Skin: warm, dry Neuro: pleasant confusion- less agitated     Assessment and Plan    #Major neurocognitive disorder S: Patient follows with Dr. Tomi Likens. Reports memory stable after covid. Marland Kitchen  History of stroke so vascular dementia could play a role as well.   A/P: overall stable per husband and son- continue current meds    # Atrial fibrillation S: Rate controlled with bisoprolol 5 mg Anticoagulated with Xarelto 15 mg A/P: Controlled. Continue current medications.    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 Tomi Likens S:Patient has been compliant with Keppra 1000 mg twice daily through Dr. Tomi Likens with no recurrence of seizures  A/P: doing well/stable- continue current meds   # COPD  S: Patient reports stable shortness of breath and cough - worsened after covid but stable -oxygen looks good today even without oxygen  Patient has had to use albuterol 0-1 x per day. Has oxygen at home - only using sparingly such as with activity A/P: Controlled. Continue current medications.   #Heart failure with preserved ejection fraction-reported by Dr. Alvester Chou 05/10/2018 S: Medication:None.  Edema: no increase Weight gain:weight actually down from last visit. Lower appetite after covid Shortness of breath: stable  A/P: stable without meds- continue to monitor   # Depression/agitation S: Medication:Zoloft 50 mg, buspirone 5 mg twice daily-he typically just takes once daily (tried to add half tablet in AM but became actually more irritability) A/P: family reports reasonable control/less agitation- continue to monitor  #hypertension S: medication: Bisoprolol 5 mg BP Readings from Last 3 Encounters:  12/26/21 116/82  09/25/21 130/70  09/09/21 118/82  A/P: Controlled.  Continue current medications.    #Aortic atherosclerosis- LDL goal under 70 #hyperlipidemia S: Medication:Atorvastatin 20 mg Lab Results  Component Value Date   CHOL 96 04/20/2019   HDL 30.80 (L) 04/20/2019   LDLCALC 90 08/07/2014   LDLDIRECT 40.0 04/20/2019   TRIG 266.0 (H) 04/20/2019   CHOLHDL 3 04/20/2019    A/P: Excellent control last check but due for updated panel-offered and declineds    #History of Laryngeal carcinoma (no code noted for history of).  Remote history with resection in 1986 and later radiation-chronic hoarseness related to this.  No evidence of recurrence- weight loss but mainly after covid- will continue to trend this- does not  want to investigate further at this time  (she is actually happy to lose weight) - palliative care team already involved  #Mild aortic stenosis- declines another echo again today. With dementia woudlnt want procedure anyway even if needed so I understand  Recommended follow up: No follow-ups on file. Future Appointments  Date Time Provider [place]  01/13/2022  2:30 PM Pieter Partridge, DO [facility] None  03/10/2022  3:45 PM Gardiner Barefoot, DPM [facility]  04/10/2022  3:15 PM [facility] HEALTH COACH [facility]   Lab/Order associations:   [id number]   1. Diastolic congestive heart failure, unspecified HF chronicity ([facility])  I50.30     2. AF (paroxysmal atrial fibrillation) ([facility])  I48.0     3. Recurrent major depressive disorder, in full remission ([place])  F33.42     4. Essential hypertension  I10     5. Hyperlipidemia, unspecified hyperlipidemia type  E78.5     6. Laryngeal carcinoma ([place])  C32.9      Return precautions advised.  [name], MD

## 2021-12-29 ENCOUNTER — Other Ambulatory Visit: Payer: Self-pay | Admitting: Family Medicine

## 2022-01-12 NOTE — Progress Notes (Deleted)
NEUROLOGY FOLLOW UP OFFICE NOTE  [name] [id number]  Assessment/Plan:   1.  Major neurocognitive disorder  Depression continues to be a significant factor. 2.  Focal onset epilepsy without impaired consciousness 3  Chronic pain related to lumbar spinal stenosis - due to age and risk of stroke off AC, surgery declined.   4  History of cardioembolic stroke 5. Major depressive disorder   1.  For sundowning as well as paranoia during day, Increase quetiapine to 12.'5mg'$  in morning (to treat daytime confusion and agitation) and '25mg'$  at night (take at 7 PM).  If daytime symptoms not problematic, may take '50mg'$  at 7 PM.  Advised family to reassure patient. 2.  Keppra '1000mg'$  twice daily 3.  sertraline '50mg'$  daily 4   Follow up in 6 months.   Subjective:  [name] is an 88 year old right-handed female with lumbar stenosis, and history of symptomatic seizure secondary to cardioembolic stroke who follows up for dementia.  Accompanied by husband and daughter (other daughter via phone) who supplement history.   UPDATE: Current medications:  Keppra '1000mg'$  twice daily; [name], gabapentin '200mg'$  at bedtime, sertraline '50mg'$  daily, quetiapine 12.'5mg'$  in AM and '50mg'$  at night  She continues to have significant irritability and anger.  She has paranoia, believing that people are out to do something bad to her.  Sometimes she thinks that there is an imposter of her husband also in the house.  For the past couple of years, she has had a tremor in her hands which has been more prominent over the past several months. It occurs with action and at rest.   Increased sertraline helped with mood for a little while.  Overall, depressed.  Does not bathe and dress everyday.  If she is sitting, she will easily start napping.  EKG in August was okay so she was started on quetiapine at night to treat paranoia/Capgras syndrome (thinks husband is an Agricultural consultant).  Usually occurs at night, starts right at 8 PM  after [employer].  However, husband says it sometimes happens during the day.     HISTORY: She has had several episodes of confusion and aphasia in October and November of 2014, associated with stroke and presumed seizure.  On 05/26/13, MRI of the brain was performed, which revealed an acute lacunar infarct in the left anterior corona radiata and lentiform nucleus.  CTA of the head revealed atherosclerotic changes in the cavernous carotid arteries without significant stenosis.  CTA of neck revealed focal 50% stenosis of mid right ICA.  She was switched from [name] to Eliquis.  She was readmitted to the hospital on 05/31/13 with recurrent aphasia.  She had trouble getting her words out and then couldn't speak at all.  Repeat MRI of brain revealed new punctate infarct in the left posterior corona radiata, the same location as previous infarct. No changes in management were made.  She was readmitted to the hospital on 06/07/13 for altered mental status.  EEG was performed, showing left fronto-parieto-temporal theta slowing.  Due to possibility of seizure, she was started on Keppra '250mg'$  BID.  No change made to Eliquis.  She presented to the ED on 06/16/13 after waking up in the morning with expressive aphasia and right-sided weakness.  Symptoms improved in the ED.  CT head revealed prior lacunar infarct in left lentiform nucleus and corona radiata, but no new abnormalities.  It was suspected that she may have had another seizure and her Keppra was increased to  $'500mg'p$  BID.  She was admitted to the hospital again on 06/21/13 again for expressive aphasia.  MRI brain revealed 2 new punctate infarcts in the bilateral hemispheres at level of centrum semiovale.  Eliquis was changed to Xarelto. Keppra was increased to '750mg'$  BID.     She presented to the ED on 08/05/14 for recurrent episode of aphasia, lasting 1.5 hours.  This was more significant than the previous year.  She described it as knowing what she wanted to  say but having trouble getting the words out.  She had trouble writing as well.  MRI of brain revealed no acute infarct.  She had an episode two days prior, lasting 1 hour.  Carotid doppler revealed no hemodynamically significant ICA stenosis.     She was admitted to [facility] from 05/01/17 to 05/03/17 for altered mental status and generalized weakness with 2 seizures over the weekend.  She was found by her daughter sitting in the dark moaning with foaming at the mouth and left sided shaking lasting a couple of minutes.  She was altered for much longer time.  CT of head from 04/30/17 and 05/01/17 revealed no acute changes. She was found to have a temperature of 100.3.  She was found to have Klebsiella UTI/cystitis on UA and culture.  She was loaded with Keppra and treated with Cipro.  Keppra was increased to '1000mg'$  twice daily.  About a week prior, she reportedly had another brief episode of jibberish speech and right facial droop, lasting 2-3 minutes.   In 2019, she has had significant depression related to her chronic pain, loss of independence (such as no longer able to drive), and the death of her daughter.  Back in June, we try to transition her from [place] to [name] in an attempt to alleviate some of her irritability.  She began experiencing increased confusion, falls, irritability and paranoia.  She was subsequently switched back from [name] to [place].  She was started on [name] to help with agitation.    PAST MEDICAL HISTORY: Past Medical History:  Diagnosis Date   ABDOMINAL INCISIONAL HERNIA 04/03/2010       Anxiety    Arthritis    "lots; hands" (05/26/2013)   Blood transfusion    "w/1st miscarriage" (05/26/2013)   COPD (chronic obstructive pulmonary disease) (HCC)    Coronary artery disease    Depression    Diverticulitis of colon with hemorrhage 07/24/2008   DVT (deep venous thrombosis) ([place]) 1954   "after I had my first child" (05/26/2013)   Exertional shortness of breath     Family history of anesthesia complication    "sisters also get sick as a dog" (05/26/2013)   GERD 05/03/2007       GERD (gastroesophageal reflux disease)    H/O hiatal hernia    HCAP (healthcare-associated pneumonia) 06/26/2013   Heart murmur    "when I was a child" (05/26/2013)   History of TIAs    Hyperlipidemia    Hypertension    Keratotic plaque    Laryngeal carcinoma (HCC) hx of ca   remotely with resection and xrt/chronic hoarsemenss   Lymphona, mantle cell, inguinal region/lower limb ([place]    "large c-cell" (05/26/2013)   OSTEOARTHRITIS 05/03/2007   PEPTIC ULCER DISEASE 07/06/2008   Pneumonia 2013   "once" (05/26/2013)   PUD (peptic ulcer disease)    PVD (peripheral vascular disease) ([place])    Seizures ([place])    Stroke ([place]) 05/26/2013   "very mild; affected my speech for a  while" (05/26/2013)   VARICOSE VEINS LOWER EXTREMITIES W/INFLAMMATION 01/15/2009    MEDICATIONS: Current Outpatient Medications on File Prior to Visit  Medication Sig Dispense Refill   albuterol (VENTOLIN HFA) 108 (90 Base) MCG/ACT inhaler TAKE 2 PUFFS BY MOUTH EVERY 6 HOURS AS NEEDED FOR WHEEZE OR SHORTNESS OF BREATH 18 each 1   atorvastatin (LIPITOR) 20 MG tablet TAKE 1 TABLET BY MOUTH EVERY DAY 90 tablet 1   benzonatate (TESSALON) 100 MG capsule Take 1 capsule (100 mg total) by mouth 2 (two) times daily as needed for cough. 20 capsule 2   bisoprolol (ZEBETA) 5 MG tablet TAKE 1 TABLET BY MOUTH EVERY DAY 90 tablet 1   busPIRone ([name]) 5 MG tablet TAKE 1 TABLET BY MOUTH TWICE A DAY 180 tablet 3   cholecalciferol (VITAMIN D) 1000 UNITS tablet Take 1,000 Units by mouth daily.     Cyanocobalamin (VITAMIN B-12 PO) Take 1 tablet by mouth daily.     levETIRAcetam (KEPPRA) 1000 MG tablet TAKE 1 TABLET BY MOUTH TWICE A DAY 180 tablet 1   loperamide (IMODIUM) 2 MG capsule Take 2 mg by mouth daily as needed for diarrhea or loose stools.      QUEtiapine (SEROQUEL) 25 MG tablet 1 TABLET BEDTIME 30 tablet 1    sertraline (ZOLOFT) 50 MG tablet TAKE 1 TABLET BY MOUTH EVERY DAY 90 tablet 1   XARELTO 15 MG TABS tablet TAKE 1 TABLET BY MOUTH EVERY DAY WITH SUPPER 30 tablet 5   No current facility-administered medications on file prior to visit.    ALLERGIES: Allergies  Allergen Reactions   Ativan [Lorazepam] Other (See Comments)    Severe hallucinations    Gabapentin     encephalopathy   Propoxyphene Hcl Nausea And Vomiting   Codeine Itching   Penicillins Rash    Has patient had a PCN reaction causing immediate rash, facial/tongue/throat swelling, SOB or lightheadedness with hypotension: Yes Has patient had a PCN reaction causing severe rash involving mucus membranes or skin necrosis: Unk Has patient had a PCN reaction that required hospitalization: No Has patient had a PCN reaction occurring within the last 10 years: Yes If all of the above answers are "NO", then may proceed with Cephalosporin use.    Vancomycin Rash    FAMILY HISTORY: Family History  Problem Relation Age of Onset   Colon cancer Sister        colon   Hyperlipidemia Mother    Stroke Mother    Cancer Father        mesothelioma      Objective:  *** General: No acute distress.  Patient appears well-groomed.   Head:  Normocephalic/atraumatic Eyes:  Fundi examined but not visualized Neck: supple, no paraspinal tenderness, full range of motion Heart:  Regular rate and rhythm Lungs:  Clear to auscultation bilaterally Back: No paraspinal tenderness Neurological Exam: alert and oriented to person, place, and time.  Speech fluent and not dysarthric, language intact.  CN II-XII intact. Bulk and tone normal, muscle strength 5/5 throughout.  Sensation to light touch intact.  Deep tendon reflexes 2+ throughout, toes downgoing.  Finger to nose testing intact.  Gait normal, Romberg negative.   Metta Clines, DO  CC: ***

## 2022-01-13 ENCOUNTER — Ambulatory Visit: Payer: Medicare Other | Admitting: Neurology

## 2022-01-14 NOTE — Progress Notes (Signed)
NEUROLOGY FOLLOW UP OFFICE NOTE  [name] [id number]  Assessment/Plan:   1.  Major neurocognitive disorder  Depression continues to be a significant factor. 2.  Focal onset epilepsy without impaired consciousness 3  Chronic pain related to lumbar spinal stenosis - due to age and risk of stroke off AC, surgery declined.   4  History of cardioembolic stroke 5. Major depressive disorder   1.  Start memantine titrating to '10mg'$  twice daily (may help with behavioral symptoms as well) 2.  Continue quetiapine '25mg'$  at bedtime 2.  Keppra '1000mg'$  twice daily 3.  sertraline '50mg'$  daily 4   Follow up in 6 months.   Subjective:  [name] is an 88 year old right-handed female with lumbar stenosis, and history of symptomatic seizure secondary to cardioembolic stroke who follows up for dementia.  Accompanied by husband and daughter who supplement history.   UPDATE: Current medications:  Keppra '1000mg'$  twice daily; [name], sertraline '50mg'$  daily, quetiapine '25mg'$  at bedtime  Goes to bed 7:30 PM but falls asleep 10 PM.  Wakes up 6 AM  Increased quetiapine to 12.'5mg'$  during day last visit.  It caused drowsiness so she continues just '25mg'$  at night.  Paranoia/Capgras syndrome has become less frequent but significant when it does occur.  Usually occurs in evening around dinner.  Feels upset and confused.      HISTORY: She has had several episodes of confusion and aphasia in October and November of 2014, associated with stroke and presumed seizure.  On 05/26/13, MRI of the brain was performed, which revealed an acute lacunar infarct in the left anterior corona radiata and lentiform nucleus.  CTA of the head revealed atherosclerotic changes in the cavernous carotid arteries without significant stenosis.  CTA of neck revealed focal 50% stenosis of mid right ICA.  She was switched from [name] to Eliquis.  She was readmitted to the hospital on 05/31/13 with recurrent aphasia.  She had trouble  getting her words out and then couldn't speak at all.  Repeat MRI of brain revealed new punctate infarct in the left posterior corona radiata, the same location as previous infarct. No changes in management were made.  She was readmitted to the hospital on 06/07/13 for altered mental status.  EEG was performed, showing left fronto-parieto-temporal theta slowing.  Due to possibility of seizure, she was started on Keppra '250mg'$  BID.  No change made to Eliquis.  She presented to the ED on 06/16/13 after waking up in the morning with expressive aphasia and right-sided weakness.  Symptoms improved in the ED.  CT head revealed prior lacunar infarct in left lentiform nucleus and corona radiata, but no new abnormalities.  It was suspected that she may have had another seizure and her Keppra was increased to '500mg'$  BID.  She was admitted to the hospital again on 06/21/13 again for expressive aphasia.  MRI brain revealed 2 new punctate infarcts in the bilateral hemispheres at level of centrum semiovale.  Eliquis was changed to Xarelto. Keppra was increased to '750mg'$  BID.     She presented to the ED on 08/05/14 for recurrent episode of aphasia, lasting 1.5 hours.  This was more significant than the previous year.  She described it as knowing what she wanted to say but having trouble getting the words out.  She had trouble writing as well.  MRI of brain revealed no acute infarct.  She had an episode two days prior, lasting 1 hour.  Carotid doppler revealed no hemodynamically significant ICA  stenosis.     She was admitted to [facility] from 05/01/17 to 05/03/17 for altered mental status and generalized weakness with 2 seizures over the weekend.  She was found by her daughter sitting in the dark moaning with foaming at the mouth and left sided shaking lasting a couple of minutes.  She was altered for much longer time.  CT of head from 04/30/17 and 05/01/17 revealed no acute changes. She was found to have a temperature of  100.3.  She was found to have Klebsiella UTI/cystitis on UA and culture.  She was loaded with Keppra and treated with Cipro.  Keppra was increased to '1000mg'$  twice daily.  About a week prior, she reportedly had another brief episode of jibberish speech and right facial droop, lasting 2-3 minutes.   In 2019, she has had significant depression related to her chronic pain, loss of independence (such as no longer able to drive), and the death of her daughter.  Back in June, we try to transition her from [place] to [name] in an attempt to alleviate some of her irritability.  She began experiencing increased confusion, falls, irritability and paranoia.  She was subsequently switched back from [name] to [place].  She was started on [name] to help with agitation.    PAST MEDICAL HISTORY: Past Medical History:  Diagnosis Date   ABDOMINAL INCISIONAL HERNIA 04/03/2010       Anxiety    Arthritis    "lots; hands" (05/26/2013)   Blood transfusion    "w/1st miscarriage" (05/26/2013)   COPD (chronic obstructive pulmonary disease) (HCC)    Coronary artery disease    Depression    Diverticulitis of colon with hemorrhage 07/24/2008   DVT (deep venous thrombosis) ([place]) 1954   "after I had my first child" (05/26/2013)   Exertional shortness of breath    Family history of anesthesia complication    "sisters also get sick as a dog" (05/26/2013)   GERD 05/03/2007       GERD (gastroesophageal reflux disease)    H/O hiatal hernia    HCAP (healthcare-associated pneumonia) 06/26/2013   Heart murmur    "when I was a child" (05/26/2013)   History of TIAs    Hyperlipidemia    Hypertension    Keratotic plaque    Laryngeal carcinoma (HCC) hx of ca   remotely with resection and xrt/chronic hoarsemenss   Lymphona, mantle cell, inguinal region/lower limb ([place])    "large c-cell" (05/26/2013)   OSTEOARTHRITIS 05/03/2007   PEPTIC ULCER DISEASE 07/06/2008   Pneumonia 2013   "once" (05/26/2013)   PUD (peptic ulcer  disease)    PVD (peripheral vascular disease) ([place])    Seizures ([place])    Stroke ([place]) 05/26/2013   "very mild; affected my speech for a while" (05/26/2013)   VARICOSE VEINS LOWER EXTREMITIES W/INFLAMMATION 01/15/2009    MEDICATIONS: Current Outpatient Medications on File Prior to Visit  Medication Sig Dispense Refill   albuterol (VENTOLIN HFA) 108 (90 Base) MCG/ACT inhaler TAKE 2 PUFFS BY MOUTH EVERY 6 HOURS AS NEEDED FOR WHEEZE OR SHORTNESS OF BREATH 18 each 1   atorvastatin (LIPITOR) 20 MG tablet TAKE 1 TABLET BY MOUTH EVERY DAY 90 tablet 1   benzonatate (TESSALON) 100 MG capsule Take 1 capsule (100 mg total) by mouth 2 (two) times daily as needed for cough. 20 capsule 2   bisoprolol (ZEBETA) 5 MG tablet TAKE 1 TABLET BY MOUTH EVERY DAY 90 tablet 1   busPIRone ([name]) 5 MG tablet TAKE 1  TABLET BY MOUTH TWICE A DAY 180 tablet 3   cholecalciferol (VITAMIN D) 1000 UNITS tablet Take 1,000 Units by mouth daily.     Cyanocobalamin (VITAMIN B-12 PO) Take 1 tablet by mouth daily.     levETIRAcetam (KEPPRA) 1000 MG tablet TAKE 1 TABLET BY MOUTH TWICE A DAY 180 tablet 1   loperamide (IMODIUM) 2 MG capsule Take 2 mg by mouth daily as needed for diarrhea or loose stools.      QUEtiapine (SEROQUEL) 25 MG tablet 1 TABLET BEDTIME 30 tablet 1   sertraline (ZOLOFT) 50 MG tablet TAKE 1 TABLET BY MOUTH EVERY DAY 90 tablet 1   XARELTO 15 MG TABS tablet TAKE 1 TABLET BY MOUTH EVERY DAY WITH SUPPER 30 tablet 5   No current facility-administered medications on file prior to visit.    ALLERGIES: Allergies  Allergen Reactions   Ativan [Lorazepam] Other (See Comments)    Severe hallucinations    Gabapentin     encephalopathy   Propoxyphene Hcl Nausea And Vomiting   Codeine Itching   Penicillins Rash    Has patient had a PCN reaction causing immediate rash, facial/tongue/throat swelling, SOB or lightheadedness with hypotension: Yes Has patient had a PCN reaction causing severe rash involving mucus  membranes or skin necrosis: Unk Has patient had a PCN reaction that required hospitalization: No Has patient had a PCN reaction occurring within the last 10 years: Yes If all of the above answers are "NO", then may proceed with Cephalosporin use.    Vancomycin Rash    FAMILY HISTORY: Family History  Problem Relation Age of Onset   Colon cancer Sister        colon   Hyperlipidemia Mother    Stroke Mother    Cancer Father        mesothelioma      Objective:  Blood pressure 125/86, pulse 82, height '5\' 5"'$  (1.651 m), weight 158 lb (71.7 kg), SpO2 (!) 81 %. General: No acute distress but mildly agitated.  Patient appears well-groomed.   Head:  Normocephalic/atraumatic Eyes:  Fundi examined but not visualized Heart:  Regular rate and rhythm Neurological Exam: alert and oriented to person and place.  Speech fluent and not dysarthric, language intact.  CN II-XII intact. Bulk and tone normal, muscle strength 5/5 throughout.  Sensation to light touch intact.  Deep tendon reflexes 2+ throughout, toes downgoing.  Finger to nose testing intact.  Gait unsteady.  Uses walker for assistance.   Metta Clines, DO  CC: [name], MD

## 2022-01-19 ENCOUNTER — Ambulatory Visit (INDEPENDENT_AMBULATORY_CARE_PROVIDER_SITE_OTHER): Admitting: Neurology

## 2022-01-19 ENCOUNTER — Encounter: Payer: Self-pay | Admitting: Neurology

## 2022-01-19 VITALS — BP 125/86 | HR 82 | Ht 65.0 in | Wt 158.0 lb

## 2022-01-19 DIAGNOSIS — F3342 Major depressive disorder, recurrent, in full remission: Secondary | ICD-10-CM | POA: Diagnosis not present

## 2022-01-19 DIAGNOSIS — F0390 Unspecified dementia without behavioral disturbance: Secondary | ICD-10-CM

## 2022-01-19 DIAGNOSIS — I639 Cerebral infarction, unspecified: Secondary | ICD-10-CM

## 2022-01-19 DIAGNOSIS — G40109 Localization-related (focal) (partial) symptomatic epilepsy and epileptic syndromes with simple partial seizures, not intractable, without status epilepticus: Secondary | ICD-10-CM | POA: Diagnosis not present

## 2022-01-19 MED ORDER — MEMANTINE HCL 10 MG PO TABS: ORAL_TABLET | ORAL | 0 refills | Status: DC

## 2022-01-19 NOTE — Patient Instructions (Signed)
Take memantine '10mg'$  tablet: Take 1/2 tablet at bedtime for one week Then 1/2 tablet twice daily for one week Then 1 tablet twice daily Continue quetiapine '25mg'$  at bedtime Continue levetiracetam Follow up 6 months.

## 2022-01-25 ENCOUNTER — Other Ambulatory Visit: Payer: Self-pay | Admitting: Family Medicine

## 2022-01-25 ENCOUNTER — Other Ambulatory Visit: Payer: Self-pay | Admitting: Neurology

## 2022-01-26 ENCOUNTER — Encounter (HOSPITAL_COMMUNITY): Payer: Self-pay | Admitting: Emergency Medicine

## 2022-01-26 ENCOUNTER — Inpatient Hospital Stay (HOSPITAL_COMMUNITY)

## 2022-01-26 ENCOUNTER — Other Ambulatory Visit: Payer: Self-pay

## 2022-01-26 ENCOUNTER — Emergency Department (HOSPITAL_COMMUNITY)

## 2022-01-26 ENCOUNTER — Inpatient Hospital Stay (HOSPITAL_COMMUNITY)
Admission: EM | Admit: 2022-01-26 | Discharge: 2022-01-27 | DRG: 194 | Disposition: A | Discharge status: Hospice | Attending: Internal Medicine | Admitting: Internal Medicine

## 2022-01-26 DIAGNOSIS — I5030 Unspecified diastolic (congestive) heart failure: Secondary | ICD-10-CM | POA: Diagnosis present

## 2022-01-26 DIAGNOSIS — Z8521 Personal history of malignant neoplasm of larynx: Secondary | ICD-10-CM | POA: Diagnosis not present

## 2022-01-26 DIAGNOSIS — J9621 Acute and chronic respiratory failure with hypoxia: Secondary | ICD-10-CM

## 2022-01-26 DIAGNOSIS — I739 Peripheral vascular disease, unspecified: Secondary | ICD-10-CM | POA: Diagnosis present

## 2022-01-26 DIAGNOSIS — F0393 Unspecified dementia, unspecified severity, with mood disturbance: Secondary | ICD-10-CM | POA: Diagnosis present

## 2022-01-26 DIAGNOSIS — K219 Gastro-esophageal reflux disease without esophagitis: Secondary | ICD-10-CM | POA: Diagnosis present

## 2022-01-26 DIAGNOSIS — F32A Depression, unspecified: Secondary | ICD-10-CM | POA: Diagnosis present

## 2022-01-26 DIAGNOSIS — E785 Hyperlipidemia, unspecified: Secondary | ICD-10-CM | POA: Diagnosis present

## 2022-01-26 DIAGNOSIS — N1832 Chronic kidney disease, stage 3b: Secondary | ICD-10-CM | POA: Diagnosis present

## 2022-01-26 DIAGNOSIS — Z86718 Personal history of other venous thrombosis and embolism: Secondary | ICD-10-CM | POA: Diagnosis not present

## 2022-01-26 DIAGNOSIS — N179 Acute kidney failure, unspecified: Secondary | ICD-10-CM | POA: Diagnosis present

## 2022-01-26 DIAGNOSIS — F0394 Unspecified dementia, unspecified severity, with anxiety: Secondary | ICD-10-CM | POA: Diagnosis present

## 2022-01-26 DIAGNOSIS — Z8673 Personal history of transient ischemic attack (TIA), and cerebral infarction without residual deficits: Secondary | ICD-10-CM | POA: Diagnosis not present

## 2022-01-26 DIAGNOSIS — Z66 Do not resuscitate: Secondary | ICD-10-CM | POA: Diagnosis present

## 2022-01-26 DIAGNOSIS — Z885 Allergy status to narcotic agent status: Secondary | ICD-10-CM

## 2022-01-26 DIAGNOSIS — F411 Generalized anxiety disorder: Secondary | ICD-10-CM | POA: Diagnosis present

## 2022-01-26 DIAGNOSIS — Z96653 Presence of artificial knee joint, bilateral: Secondary | ICD-10-CM | POA: Diagnosis present

## 2022-01-26 DIAGNOSIS — I10 Essential (primary) hypertension: Secondary | ICD-10-CM | POA: Diagnosis present

## 2022-01-26 DIAGNOSIS — I130 Hypertensive heart and chronic kidney disease with heart failure and stage 1 through stage 4 chronic kidney disease, or unspecified chronic kidney disease: Secondary | ICD-10-CM | POA: Diagnosis present

## 2022-01-26 DIAGNOSIS — F0390 Unspecified dementia without behavioral disturbance: Secondary | ICD-10-CM | POA: Diagnosis not present

## 2022-01-26 DIAGNOSIS — Z20822 Contact with and (suspected) exposure to covid-19: Secondary | ICD-10-CM | POA: Diagnosis present

## 2022-01-26 DIAGNOSIS — Z823 Family history of stroke: Secondary | ICD-10-CM

## 2022-01-26 DIAGNOSIS — Z880 Allergy status to penicillin: Secondary | ICD-10-CM | POA: Diagnosis not present

## 2022-01-26 DIAGNOSIS — I482 Chronic atrial fibrillation, unspecified: Secondary | ICD-10-CM | POA: Diagnosis present

## 2022-01-26 DIAGNOSIS — J440 Chronic obstructive pulmonary disease with acute lower respiratory infection: Secondary | ICD-10-CM | POA: Diagnosis present

## 2022-01-26 DIAGNOSIS — Z515 Encounter for palliative care: Secondary | ICD-10-CM

## 2022-01-26 DIAGNOSIS — J189 Pneumonia, unspecified organism: Secondary | ICD-10-CM | POA: Diagnosis present

## 2022-01-26 DIAGNOSIS — Z87891 Personal history of nicotine dependence: Secondary | ICD-10-CM

## 2022-01-26 DIAGNOSIS — I5032 Chronic diastolic (congestive) heart failure: Secondary | ICD-10-CM | POA: Diagnosis present

## 2022-01-26 DIAGNOSIS — R6510 Systemic inflammatory response syndrome (SIRS) of non-infectious origin without acute organ dysfunction: Secondary | ICD-10-CM

## 2022-01-26 DIAGNOSIS — Z888 Allergy status to other drugs, medicaments and biological substances status: Secondary | ICD-10-CM

## 2022-01-26 DIAGNOSIS — I480 Paroxysmal atrial fibrillation: Secondary | ICD-10-CM | POA: Diagnosis present

## 2022-01-26 DIAGNOSIS — J449 Chronic obstructive pulmonary disease, unspecified: Secondary | ICD-10-CM | POA: Diagnosis present

## 2022-01-26 DIAGNOSIS — I2510 Atherosclerotic heart disease of native coronary artery without angina pectoris: Secondary | ICD-10-CM | POA: Diagnosis present

## 2022-01-26 DIAGNOSIS — Z7901 Long term (current) use of anticoagulants: Secondary | ICD-10-CM

## 2022-01-26 DIAGNOSIS — Z8572 Personal history of non-Hodgkin lymphomas: Secondary | ICD-10-CM

## 2022-01-26 DIAGNOSIS — N1831 Chronic kidney disease, stage 3a: Secondary | ICD-10-CM

## 2022-01-26 DIAGNOSIS — Z83438 Family history of other disorder of lipoprotein metabolism and other lipidemia: Secondary | ICD-10-CM

## 2022-01-26 DIAGNOSIS — Z79899 Other long term (current) drug therapy: Secondary | ICD-10-CM

## 2022-01-26 DIAGNOSIS — F03918 Unspecified dementia, unspecified severity, with other behavioral disturbance: Secondary | ICD-10-CM | POA: Diagnosis present

## 2022-01-26 LAB — URINALYSIS, ROUTINE W REFLEX MICROSCOPIC
Bacteria, UA: NONE SEEN
Bilirubin Urine: NEGATIVE
Glucose, UA: NEGATIVE mg/dL
Hgb urine dipstick: NEGATIVE
Ketones, ur: NEGATIVE mg/dL
Leukocytes,Ua: NEGATIVE
Nitrite: NEGATIVE
Protein, ur: 30 mg/dL — AB
Specific Gravity, Urine: 1.017 (ref 1.005–1.030)
pH: 7 (ref 5.0–8.0)

## 2022-01-26 LAB — CBC
HCT: 35.8 % — ABNORMAL LOW (ref 36.0–46.0)
Hemoglobin: 11 g/dL — ABNORMAL LOW (ref 12.0–15.0)
MCH: 26.3 pg (ref 26.0–34.0)
MCHC: 30.7 g/dL (ref 30.0–36.0)
MCV: 85.4 fL (ref 80.0–100.0)
Platelets: 211 10*3/uL (ref 150–400)
RBC: 4.19 MIL/uL (ref 3.87–5.11)
RDW: 15.2 % (ref 11.5–15.5)
WBC: 10.3 10*3/uL (ref 4.0–10.5)
nRBC: 0 % (ref 0.0–0.2)

## 2022-01-26 LAB — BASIC METABOLIC PANEL
Anion gap: 8 (ref 5–15)
BUN: 17 mg/dL (ref 8–23)
CO2: 27 mmol/L (ref 22–32)
Calcium: 9.3 mg/dL (ref 8.9–10.3)
Chloride: 105 mmol/L (ref 98–111)
Creatinine, Ser: 1.3 mg/dL — ABNORMAL HIGH (ref 0.44–1.00)
GFR, Estimated: 39 mL/min — ABNORMAL LOW (ref >60)
Glucose, Bld: 116 mg/dL — ABNORMAL HIGH (ref 70–99)
Potassium: 4.3 mmol/L (ref 3.5–5.1)
Sodium: 140 mmol/L (ref 135–145)

## 2022-01-26 LAB — LACTIC ACID, PLASMA
Lactic Acid, Venous: 1 mmol/L (ref 0.5–1.9)
Lactic Acid, Venous: 0.7 mmol/L (ref 0.5–1.9)

## 2022-01-26 LAB — RESP PANEL BY RT-PCR (FLU A&B, COVID) ARPGX2
Influenza A by PCR: NEGATIVE
Influenza B by PCR: NEGATIVE
SARS Coronavirus 2 by RT PCR: NEGATIVE

## 2022-01-26 LAB — MAGNESIUM: Magnesium: 1.9 mg/dL (ref 1.7–2.4)

## 2022-01-26 LAB — STREP PNEUMONIAE URINARY ANTIGEN: Strep Pneumo Urinary Antigen: NEGATIVE

## 2022-01-26 MED ORDER — RIVAROXABAN 15 MG PO TABS
15.0000 mg | ORAL_TABLET | Freq: Every day | ORAL | Status: DC
  Administered 2022-01-26 – 2022-01-27 (×2): 15 mg via ORAL
  Filled 2022-01-26 (×2): qty 1

## 2022-01-26 MED ORDER — ACETAMINOPHEN 650 MG RE SUPP: 650.0000 mg | Freq: Four times a day (QID) | RECTAL | Status: DC | PRN

## 2022-01-26 MED ORDER — AZITHROMYCIN 250 MG PO TABS
500.0000 mg | ORAL_TABLET | Freq: Every day | ORAL | Status: DC
  Administered 2022-01-27: 500 mg via ORAL
  Filled 2022-01-26: qty 2

## 2022-01-26 MED ORDER — SODIUM CHLORIDE 0.9 % IV SOLN
500.0000 mg | Freq: Once | INTRAVENOUS | Status: AC
  Administered 2022-01-26: 500 mg via INTRAVENOUS
  Filled 2022-01-26: qty 5

## 2022-01-26 MED ORDER — SODIUM CHLORIDE 0.9 % IV BOLUS
1000.0000 mL | Freq: Once | INTRAVENOUS | Status: AC
  Administered 2022-01-26: 1000 mL via INTRAVENOUS

## 2022-01-26 MED ORDER — SODIUM CHLORIDE 0.9 % IV SOLN
1.0000 g | Freq: Once | INTRAVENOUS | Status: AC
  Administered 2022-01-26: 1 g via INTRAVENOUS
  Filled 2022-01-26: qty 10

## 2022-01-26 MED ORDER — SODIUM CHLORIDE 0.9 % IV SOLN
2.0000 g | INTRAVENOUS | Status: DC
  Administered 2022-01-27: 2 g via INTRAVENOUS
  Filled 2022-01-26: qty 20

## 2022-01-26 MED ORDER — ATORVASTATIN CALCIUM 20 MG PO TABS
20.0000 mg | ORAL_TABLET | Freq: Every day | ORAL | Status: DC
  Administered 2022-01-26 – 2022-01-27 (×2): 20 mg via ORAL
  Filled 2022-01-26 (×2): qty 1

## 2022-01-26 MED ORDER — BISOPROLOL FUMARATE 5 MG PO TABS
5.0000 mg | ORAL_TABLET | Freq: Every day | ORAL | Status: DC
  Administered 2022-01-27: 5 mg via ORAL
  Filled 2022-01-26: qty 1

## 2022-01-26 MED ORDER — QUETIAPINE FUMARATE 25 MG PO TABS
25.0000 mg | ORAL_TABLET | Freq: Every day | ORAL | Status: DC
  Administered 2022-01-27: 25 mg via ORAL
  Filled 2022-01-26: qty 1

## 2022-01-26 MED ORDER — SERTRALINE HCL 50 MG PO TABS
50.0000 mg | ORAL_TABLET | Freq: Every day | ORAL | Status: DC
  Administered 2022-01-26 – 2022-01-27 (×2): 50 mg via ORAL
  Filled 2022-01-26 (×2): qty 1

## 2022-01-26 MED ORDER — LEVETIRACETAM 500 MG PO TABS
1000.0000 mg | ORAL_TABLET | Freq: Two times a day (BID) | ORAL | Status: DC
  Administered 2022-01-26 – 2022-01-27 (×3): 1000 mg via ORAL
  Filled 2022-01-26 (×3): qty 2

## 2022-01-26 MED ORDER — GUAIFENESIN ER 600 MG PO TB12
600.0000 mg | ORAL_TABLET | Freq: Two times a day (BID) | ORAL | Status: DC
  Administered 2022-01-26 – 2022-01-27 (×3): 600 mg via ORAL
  Filled 2022-01-26 (×3): qty 1

## 2022-01-26 MED ORDER — IPRATROPIUM-ALBUTEROL 0.5-2.5 (3) MG/3ML IN SOLN
3.0000 mL | Freq: Four times a day (QID) | RESPIRATORY_TRACT | Status: DC | PRN
Start: 2022-01-26 — End: 2022-01-28

## 2022-01-26 MED ORDER — ACETAMINOPHEN 325 MG PO TABS: 650.0000 mg | ORAL_TABLET | Freq: Four times a day (QID) | ORAL | Status: DC | PRN

## 2022-01-26 MED ORDER — VITAMIN D 25 MCG (1000 UNIT) PO TABS
1000.0000 [IU] | ORAL_TABLET | Freq: Every day | ORAL | Status: DC
  Administered 2022-01-26 – 2022-01-27 (×2): 1000 [IU] via ORAL
  Filled 2022-01-26 (×2): qty 1

## 2022-01-26 MED ORDER — BUSPIRONE HCL 5 MG PO TABS
5.0000 mg | ORAL_TABLET | Freq: Two times a day (BID) | ORAL | Status: DC
  Administered 2022-01-26 – 2022-01-27 (×3): 5 mg via ORAL
  Filled 2022-01-26 (×3): qty 1

## 2022-01-27 DIAGNOSIS — J189 Pneumonia, unspecified organism: Secondary | ICD-10-CM | POA: Diagnosis not present

## 2022-01-27 DIAGNOSIS — F0390 Unspecified dementia without behavioral disturbance: Secondary | ICD-10-CM

## 2022-01-27 LAB — COMPREHENSIVE METABOLIC PANEL
ALT: 8 U/L (ref 0–44)
AST: 11 U/L — ABNORMAL LOW (ref 15–41)
Albumin: 3 g/dL — ABNORMAL LOW (ref 3.5–5.0)
Alkaline Phosphatase: 71 U/L (ref 38–126)
Anion gap: 6 (ref 5–15)
BUN: 13 mg/dL (ref 8–23)
CO2: 29 mmol/L (ref 22–32)
Calcium: 9.4 mg/dL (ref 8.9–10.3)
Chloride: 107 mmol/L (ref 98–111)
Creatinine, Ser: 1.09 mg/dL — ABNORMAL HIGH (ref 0.44–1.00)
GFR, Estimated: 48 mL/min — ABNORMAL LOW (ref >60)
Glucose, Bld: 107 mg/dL — ABNORMAL HIGH (ref 70–99)
Potassium: 4.2 mmol/L (ref 3.5–5.1)
Sodium: 142 mmol/L (ref 135–145)
Total Bilirubin: 0.9 mg/dL (ref 0.3–1.2)
Total Protein: 7 g/dL (ref 6.5–8.1)

## 2022-01-27 LAB — CBC
HCT: 37.9 % (ref 36.0–46.0)
Hemoglobin: 11.2 g/dL — ABNORMAL LOW (ref 12.0–15.0)
MCH: 26 pg (ref 26.0–34.0)
MCHC: 29.6 g/dL — ABNORMAL LOW (ref 30.0–36.0)
MCV: 88.1 fL (ref 80.0–100.0)
Platelets: 205 10*3/uL (ref 150–400)
RBC: 4.3 MIL/uL (ref 3.87–5.11)
RDW: 15.1 % (ref 11.5–15.5)
WBC: 7 10*3/uL (ref 4.0–10.5)
nRBC: 0 % (ref 0.0–0.2)

## 2022-01-27 LAB — LEGIONELLA PNEUMOPHILA SEROGP 1 UR AG: L. pneumophila Serogp 1 Ur Ag: NEGATIVE

## 2022-01-27 MED ORDER — HALOPERIDOL LACTATE 5 MG/ML IJ SOLN
INTRAMUSCULAR | Status: AC
  Filled 2022-01-27: qty 1

## 2022-01-27 MED ORDER — HALOPERIDOL LACTATE 5 MG/ML IJ SOLN
5.0000 mg | Freq: Once | INTRAMUSCULAR | Status: AC
  Administered 2022-01-27: 5 mg via INTRAMUSCULAR

## 2022-01-27 MED ORDER — HALOPERIDOL 5 MG PO TABS
5.0000 mg | ORAL_TABLET | Freq: Two times a day (BID) | ORAL | Status: DC
  Administered 2022-01-27: 5 mg via ORAL
  Filled 2022-01-27: qty 1

## 2022-01-27 MED ORDER — DOXYCYCLINE CALCIUM 50 MG/5ML PO SYRP: 100.0000 mg | ORAL_SOLUTION | Freq: Two times a day (BID) | ORAL | 0 refills | Status: AC

## 2022-01-27 MED ORDER — HALOPERIDOL LACTATE 5 MG/ML IJ SOLN
2.0000 mg | Freq: Four times a day (QID) | INTRAMUSCULAR | Status: DC | PRN
  Administered 2022-01-27: 2 mg via INTRAVENOUS
  Filled 2022-01-27: qty 1

## 2022-01-27 NOTE — Progress Notes (Signed)
Pt was confused and argumentative - A&O x 1 to self. She wanted to use the bedside commode but did not want assistance getting out of the bed. Pt is weak with an unsteady gait. Eventually she allowed [place] to assist her. She also refused her medications. With much coercing, she finally took her meds in stages.

## 2022-01-28 ENCOUNTER — Other Ambulatory Visit: Payer: Self-pay | Admitting: Neurology

## 2022-01-28 ENCOUNTER — Other Ambulatory Visit: Payer: Self-pay | Admitting: Internal Medicine

## 2022-01-28 ENCOUNTER — Telehealth: Payer: Self-pay | Admitting: Family Medicine

## 2022-01-28 LAB — URINE CULTURE: Culture: 5000 — AB

## 2022-01-28 MED ORDER — QUETIAPINE FUMARATE 25 MG PO TABS: 25.0000 mg | ORAL_TABLET | Freq: Every day | ORAL | 5 refills | Status: AC

## 2022-01-28 MED ORDER — LEVOFLOXACIN 500 MG PO TABS: 500.0000 mg | ORAL_TABLET | Freq: Every day | ORAL | 0 refills | Status: AC

## 2022-01-28 NOTE — Telephone Encounter (Signed)
Tammy from [facility] called in and left a message requesting refills on quetiapine fumarate 25 MG.  Patient is unable to obtain a loaner dose from pharmacy.

## 2022-01-28 NOTE — Telephone Encounter (Signed)
Patient's daughter, [name], came in stating that patient had recently been discharged from an overnight admission to [facility] for pneumonia. While she was prescribed Doxycycline 10 mLs by mouth 2 timex x 7 days, no pharmacy has this form of the medication. Her daughter also states that her insurance does not cover the liquid form of the medication. At this time, her daughter is wondering if Dr. Yong Channel can send in the tablet form of Doxycycline. She has tried to ask the provider from the hospital but was told they were unable to do this. Please Advise.

## 2022-01-28 NOTE — Telephone Encounter (Signed)
See below

## 2022-01-29 MED ORDER — DOXYCYCLINE HYCLATE 100 MG PO TABS: 100.0000 mg | ORAL_TABLET | Freq: Two times a day (BID) | ORAL | 0 refills | Status: AC

## 2022-01-29 NOTE — Telephone Encounter (Signed)
You can send in doxycycline 100 mg twice daily #14 if patient is able to safely swallow this

## 2022-01-29 NOTE — Telephone Encounter (Signed)
Doxy sent to the pharmacy.

## 2022-01-31 LAB — CULTURE, BLOOD (ROUTINE X 2)
Culture: NO GROWTH
Culture: NO GROWTH

## 2022-02-13 ENCOUNTER — Other Ambulatory Visit: Payer: Self-pay | Admitting: Neurology

## 2022-03-03 DEATH — deceased

## 2022-03-10 ENCOUNTER — Ambulatory Visit: Payer: Medicare Other | Admitting: Podiatry

## 2022-03-13 ENCOUNTER — Other Ambulatory Visit: Payer: Self-pay | Admitting: Family Medicine

## 2022-04-10 ENCOUNTER — Ambulatory Visit: Payer: Medicare Other

## 2022-07-21 ENCOUNTER — Ambulatory Visit: Payer: Medicare Other | Admitting: Neurology
